# Patient Record
Sex: Female | Born: 1955
Health system: Southern US, Community
[De-identification: ages and names within clinical notes are randomized; demographics above are authoritative.]

## PROBLEM LIST (undated history)

## (undated) DIAGNOSIS — K219 Gastro-esophageal reflux disease without esophagitis: Secondary | ICD-10-CM

## (undated) DIAGNOSIS — Z955 Presence of coronary angioplasty implant and graft: Secondary | ICD-10-CM

## (undated) DIAGNOSIS — I209 Angina pectoris, unspecified: Secondary | ICD-10-CM

## (undated) DIAGNOSIS — E669 Obesity, unspecified: Secondary | ICD-10-CM

## (undated) DIAGNOSIS — I10 Essential (primary) hypertension: Secondary | ICD-10-CM

## (undated) DIAGNOSIS — E119 Type 2 diabetes mellitus without complications: Secondary | ICD-10-CM

## (undated) DIAGNOSIS — C801 Malignant (primary) neoplasm, unspecified: Secondary | ICD-10-CM

## (undated) DIAGNOSIS — E785 Hyperlipidemia, unspecified: Secondary | ICD-10-CM

## (undated) DIAGNOSIS — I2510 Atherosclerotic heart disease of native coronary artery without angina pectoris: Principal | ICD-10-CM

## (undated) DIAGNOSIS — I219 Acute myocardial infarction, unspecified: Secondary | ICD-10-CM

## (undated) HISTORY — DX: Obesity, unspecified: E66.9

---

## 1998-07-30 ENCOUNTER — Inpatient Hospital Stay (HOSPITAL_COMMUNITY): Admission: AD | Admit: 1998-07-30 | Discharge: 1998-07-30 | Payer: Self-pay | Admitting: Obstetrics & Gynecology

## 2001-04-10 ENCOUNTER — Emergency Department (HOSPITAL_COMMUNITY): Admission: EM | Admit: 2001-04-10 | Discharge: 2001-04-10 | Payer: Self-pay | Admitting: Emergency Medicine

## 2001-12-18 ENCOUNTER — Encounter: Payer: Self-pay | Admitting: Obstetrics

## 2001-12-18 ENCOUNTER — Inpatient Hospital Stay (HOSPITAL_COMMUNITY): Admission: AD | Admit: 2001-12-18 | Discharge: 2001-12-18 | Payer: Self-pay | Admitting: Obstetrics

## 2002-09-07 ENCOUNTER — Inpatient Hospital Stay (HOSPITAL_COMMUNITY): Admission: AD | Admit: 2002-09-07 | Discharge: 2002-09-07 | Payer: Self-pay | Admitting: *Deleted

## 2002-09-07 ENCOUNTER — Encounter: Payer: Self-pay | Admitting: *Deleted

## 2003-10-05 ENCOUNTER — Encounter: Payer: Self-pay | Admitting: *Deleted

## 2003-10-05 ENCOUNTER — Inpatient Hospital Stay (HOSPITAL_COMMUNITY): Admission: AD | Admit: 2003-10-05 | Discharge: 2003-10-05 | Payer: Self-pay | Admitting: *Deleted

## 2003-10-06 ENCOUNTER — Encounter: Payer: Self-pay | Admitting: *Deleted

## 2003-10-06 ENCOUNTER — Inpatient Hospital Stay (HOSPITAL_COMMUNITY): Admission: AD | Admit: 2003-10-06 | Discharge: 2003-10-06 | Payer: Self-pay | Admitting: Obstetrics & Gynecology

## 2003-10-10 ENCOUNTER — Inpatient Hospital Stay (HOSPITAL_COMMUNITY): Admission: AD | Admit: 2003-10-10 | Discharge: 2003-10-10 | Payer: Self-pay | Admitting: *Deleted

## 2004-12-16 DIAGNOSIS — I219 Acute myocardial infarction, unspecified: Secondary | ICD-10-CM

## 2004-12-16 HISTORY — PX: CORONARY STENT PLACEMENT: SHX1402

## 2004-12-16 HISTORY — DX: Acute myocardial infarction, unspecified: I21.9

## 2005-09-16 ENCOUNTER — Ambulatory Visit: Payer: Self-pay | Admitting: Family Medicine

## 2005-09-24 ENCOUNTER — Ambulatory Visit: Payer: Self-pay | Admitting: *Deleted

## 2005-10-08 ENCOUNTER — Encounter: Admission: RE | Admit: 2005-10-08 | Discharge: 2005-10-08 | Payer: Self-pay | Admitting: Family Medicine

## 2005-10-21 ENCOUNTER — Ambulatory Visit: Payer: Self-pay | Admitting: Family Medicine

## 2005-12-17 ENCOUNTER — Ambulatory Visit: Payer: Self-pay | Admitting: Family Medicine

## 2006-01-28 ENCOUNTER — Ambulatory Visit: Payer: Self-pay | Admitting: Family Medicine

## 2006-03-12 ENCOUNTER — Ambulatory Visit: Payer: Self-pay | Admitting: Family Medicine

## 2007-12-31 ENCOUNTER — Observation Stay (HOSPITAL_COMMUNITY): Admission: EM | Admit: 2007-12-31 | Discharge: 2008-01-03 | Payer: Self-pay | Admitting: Emergency Medicine

## 2008-02-01 ENCOUNTER — Encounter: Admission: RE | Admit: 2008-02-01 | Discharge: 2008-02-01 | Payer: Self-pay | Admitting: Family Medicine

## 2008-04-18 ENCOUNTER — Inpatient Hospital Stay (HOSPITAL_COMMUNITY): Admission: EM | Admit: 2008-04-18 | Discharge: 2008-04-20 | Payer: Self-pay | Admitting: Emergency Medicine

## 2008-05-02 ENCOUNTER — Emergency Department (HOSPITAL_COMMUNITY): Admission: EM | Admit: 2008-05-02 | Discharge: 2008-05-02 | Payer: Self-pay | Admitting: Emergency Medicine

## 2008-05-02 ENCOUNTER — Ambulatory Visit: Payer: Self-pay | Admitting: Surgery

## 2008-05-02 ENCOUNTER — Encounter (INDEPENDENT_AMBULATORY_CARE_PROVIDER_SITE_OTHER): Payer: Self-pay | Admitting: Emergency Medicine

## 2008-06-07 ENCOUNTER — Encounter: Admission: RE | Admit: 2008-06-07 | Discharge: 2008-06-07 | Payer: Self-pay | Admitting: Internal Medicine

## 2008-09-01 ENCOUNTER — Emergency Department (HOSPITAL_COMMUNITY): Admission: EM | Admit: 2008-09-01 | Discharge: 2008-09-01 | Payer: Self-pay | Admitting: Emergency Medicine

## 2008-09-04 ENCOUNTER — Emergency Department (HOSPITAL_COMMUNITY): Admission: EM | Admit: 2008-09-04 | Discharge: 2008-09-04 | Payer: Self-pay | Admitting: Emergency Medicine

## 2009-08-31 ENCOUNTER — Emergency Department (HOSPITAL_COMMUNITY): Admission: EM | Admit: 2009-08-31 | Discharge: 2009-08-31 | Payer: Self-pay | Admitting: Emergency Medicine

## 2010-01-27 ENCOUNTER — Inpatient Hospital Stay (HOSPITAL_COMMUNITY): Admission: EM | Admit: 2010-01-27 | Discharge: 2010-01-30 | Payer: Self-pay | Admitting: Emergency Medicine

## 2010-01-30 ENCOUNTER — Encounter: Payer: Self-pay | Admitting: Cardiovascular Disease

## 2010-02-15 ENCOUNTER — Ambulatory Visit: Payer: Self-pay | Admitting: Family Medicine

## 2010-02-27 ENCOUNTER — Ambulatory Visit: Payer: Self-pay | Admitting: Family Medicine

## 2010-07-26 ENCOUNTER — Ambulatory Visit: Payer: Self-pay | Admitting: Internal Medicine

## 2010-07-26 ENCOUNTER — Encounter (INDEPENDENT_AMBULATORY_CARE_PROVIDER_SITE_OTHER): Payer: Self-pay | Admitting: Family Medicine

## 2010-07-28 ENCOUNTER — Emergency Department (HOSPITAL_COMMUNITY): Admission: EM | Admit: 2010-07-28 | Discharge: 2010-07-28 | Payer: Self-pay | Admitting: Emergency Medicine

## 2011-01-06 ENCOUNTER — Encounter: Payer: Self-pay | Admitting: Internal Medicine

## 2011-03-06 LAB — D-DIMER, QUANTITATIVE: D-Dimer, Quant: 0.35 ug/mL-FEU (ref 0.00–0.48)

## 2011-03-06 LAB — COMPREHENSIVE METABOLIC PANEL
ALT: 32 U/L (ref 0–35)
AST: 27 U/L (ref 0–37)
AST: 30 U/L (ref 0–37)
Albumin: 2.9 g/dL — ABNORMAL LOW (ref 3.5–5.2)
BUN: 10 mg/dL (ref 6–23)
CO2: 23 mEq/L (ref 19–32)
Chloride: 105 mEq/L (ref 96–112)
Chloride: 108 mEq/L (ref 96–112)
Creatinine, Ser: 0.79 mg/dL (ref 0.4–1.2)
GFR calc Af Amer: >60 mL/min (ref >60)
GFR calc non Af Amer: >60 mL/min (ref >60)
GFR calc non Af Amer: >60 mL/min (ref >60)
Glucose, Bld: 237 mg/dL — ABNORMAL HIGH (ref 70–99)
Potassium: 3.7 mEq/L (ref 3.5–5.1)
Sodium: 137 mEq/L (ref 135–145)
Sodium: 137 mEq/L (ref 135–145)
Total Bilirubin: 0.3 mg/dL (ref 0.3–1.2)
Total Bilirubin: 0.4 mg/dL (ref 0.3–1.2)

## 2011-03-06 LAB — GLUCOSE, CAPILLARY
Glucose-Capillary: 176 mg/dL — ABNORMAL HIGH (ref 70–99)
Glucose-Capillary: 195 mg/dL — ABNORMAL HIGH (ref 70–99)
Glucose-Capillary: 215 mg/dL — ABNORMAL HIGH (ref 70–99)
Glucose-Capillary: 225 mg/dL — ABNORMAL HIGH (ref 70–99)
Glucose-Capillary: 239 mg/dL — ABNORMAL HIGH (ref 70–99)
Glucose-Capillary: 254 mg/dL — ABNORMAL HIGH (ref 70–99)
Glucose-Capillary: 261 mg/dL — ABNORMAL HIGH (ref 70–99)

## 2011-03-06 LAB — ABO/RH: ABO/RH(D): O POS

## 2011-03-06 LAB — DIFFERENTIAL
Lymphs Abs: 2.1 10*3/uL (ref 0.7–4.0)
Monocytes Absolute: 0.6 10*3/uL (ref 0.1–1.0)
Monocytes Relative: 7 % (ref 3–12)
Neutrophils Relative %: 62 % (ref 43–77)

## 2011-03-06 LAB — BASIC METABOLIC PANEL
CO2: 26 mEq/L (ref 19–32)
Calcium: 8.8 mg/dL (ref 8.4–10.5)
Chloride: 106 mEq/L (ref 96–112)
GFR calc Af Amer: >60 mL/min (ref >60)
Glucose, Bld: 120 mg/dL — ABNORMAL HIGH (ref 70–99)
Potassium: 3.5 mEq/L (ref 3.5–5.1)
Sodium: 138 mEq/L (ref 135–145)

## 2011-03-06 LAB — CARDIAC PANEL(CRET KIN+CKTOT+MB+TROPI)
Total CK: 53 U/L (ref 7–177)
Total CK: 62 U/L (ref 7–177)
Troponin I: 0.01 ng/mL (ref 0.00–0.06)
Troponin I: <0.01 ng/mL (ref 0.00–0.06)

## 2011-03-06 LAB — CROSSMATCH

## 2011-03-06 LAB — POCT CARDIAC MARKERS
CKMB, poc: <1 ng/mL — ABNORMAL LOW (ref 1.0–8.0)
CKMB, poc: <1 ng/mL — ABNORMAL LOW (ref 1.0–8.0)
Myoglobin, poc: 35.3 ng/mL (ref 12–200)
Myoglobin, poc: 36.8 ng/mL (ref 12–200)

## 2011-03-06 LAB — CBC
HCT: 33.5 % — ABNORMAL LOW (ref 36.0–46.0)
Hemoglobin: 11 g/dL — ABNORMAL LOW (ref 12.0–15.0)
Hemoglobin: 9.9 g/dL — ABNORMAL LOW (ref 12.0–15.0)
MCHC: 31.9 g/dL (ref 30.0–36.0)
MCHC: 32.8 g/dL (ref 30.0–36.0)
MCV: 81.8 fL (ref 78.0–100.0)
MCV: 82.1 fL (ref 78.0–100.0)
RBC: 4.07 MIL/uL (ref 3.87–5.11)
RBC: 4.22 MIL/uL (ref 3.87–5.11)
RDW: 16.3 % — ABNORMAL HIGH (ref 11.5–15.5)
RDW: 17 % — ABNORMAL HIGH (ref 11.5–15.5)
RDW: 17.3 % — ABNORMAL HIGH (ref 11.5–15.5)
WBC: 7.7 10*3/uL (ref 4.0–10.5)
WBC: 8.1 10*3/uL (ref 4.0–10.5)

## 2011-03-06 LAB — BRAIN NATRIURETIC PEPTIDE: Pro B Natriuretic peptide (BNP): <30 pg/mL (ref 0.0–100.0)

## 2011-03-18 ENCOUNTER — Emergency Department (HOSPITAL_COMMUNITY)
Admission: EM | Admit: 2011-03-18 | Discharge: 2011-03-18 | Disposition: A | Discharge status: Home / Self Care | Payer: Self-pay | Attending: Emergency Medicine | Admitting: Emergency Medicine

## 2011-03-18 DIAGNOSIS — E119 Type 2 diabetes mellitus without complications: Secondary | ICD-10-CM | POA: Insufficient documentation

## 2011-03-18 DIAGNOSIS — I2510 Atherosclerotic heart disease of native coronary artery without angina pectoris: Secondary | ICD-10-CM | POA: Insufficient documentation

## 2011-03-18 DIAGNOSIS — Z794 Long term (current) use of insulin: Secondary | ICD-10-CM | POA: Insufficient documentation

## 2011-03-18 DIAGNOSIS — H6690 Otitis media, unspecified, unspecified ear: Secondary | ICD-10-CM | POA: Insufficient documentation

## 2011-03-18 DIAGNOSIS — I252 Old myocardial infarction: Secondary | ICD-10-CM | POA: Insufficient documentation

## 2011-03-18 DIAGNOSIS — I10 Essential (primary) hypertension: Secondary | ICD-10-CM | POA: Insufficient documentation

## 2011-03-18 LAB — GLUCOSE, CAPILLARY: Glucose-Capillary: 397 mg/dL — ABNORMAL HIGH (ref 70–99)

## 2011-03-26 ENCOUNTER — Emergency Department (HOSPITAL_COMMUNITY)
Admission: EM | Admit: 2011-03-26 | Discharge: 2011-03-26 | Disposition: A | Discharge status: Home / Self Care | Payer: Self-pay | Attending: Emergency Medicine | Admitting: Emergency Medicine

## 2011-03-26 DIAGNOSIS — I10 Essential (primary) hypertension: Secondary | ICD-10-CM | POA: Insufficient documentation

## 2011-03-26 DIAGNOSIS — I252 Old myocardial infarction: Secondary | ICD-10-CM | POA: Insufficient documentation

## 2011-03-26 DIAGNOSIS — H6120 Impacted cerumen, unspecified ear: Secondary | ICD-10-CM | POA: Insufficient documentation

## 2011-03-26 DIAGNOSIS — I2510 Atherosclerotic heart disease of native coronary artery without angina pectoris: Secondary | ICD-10-CM | POA: Insufficient documentation

## 2011-03-26 DIAGNOSIS — E119 Type 2 diabetes mellitus without complications: Secondary | ICD-10-CM | POA: Insufficient documentation

## 2011-04-30 NOTE — Discharge Summary (Signed)
[name], [name]                 ACCOUNT NO.:  [id number]   MEDICAL RECORD NO.:  [id number]          PATIENT TYPE:  OBV   LOCATION:  2010                         FACILITY:  [facility]   PHYSICIAN:  Lezlie Octave, N.P.     DATE OF BIRTH:  1956/01/23   DATE OF ADMISSION:  12/31/2007  DATE OF DISCHARGE:  01/03/2008                               DISCHARGE SUMMARY   HISTORY:  [name] is a 54 year old African American female patient  with prior medical history of coronary artery disease.  She had an MI,  distal circumflex Taxus stenting in July of 2006 in [place].  We  apparently had seen her in the office March 04, 2007, and a 2-D and a  Myoview were done that was within normal limits.  She was seen in the  emergency room with complaints of substernal chest pain, aching across  the chest with nausea, shortness of breath for several days.  She states  it was similar to her pre MI symptoms.  Thus she was admitted for  further evaluation.  She was seen by Dr. Lynnea Ferrier.  Her labs came back.  PPI was ordered.  Her hemoglobin was 7.2 and she was given 2 units of  packed red blood cells.  A GI consult was called and she was seen by Dr.  Elnoria Howard.  An EGD was done and it showed esophagitis, gastritis and duodenal  ulcers.  An Helicobacter pylori was sent that will be followed by Dr.  Elnoria Howard.  She also mentioned that she had menorrhagia.  We tried to call a  GYN doctor, however, this was difficult and it was decided that she was  stable and that she could follow up as an outpatient.  She does not have  a GYN doctor here at this time, thus we gave her Dr. Sharol Roussel number of  [phone] to call as an outpatient.  It was also noted that her diabetes  was not well controlled and she was started on Lantus insulin. We did  check an A1c which was elevated at 9.2.   Her hemoglobin on the day of discharge January 03, 2008, was 9.3.  Her  hematocrit was 28.8, her WBCs were 9.9 and her glucose was 360.  Her  blood  pressure was 130/78, pulse was 93, respirations 18, temperature  was 97.6.  Her Diovan HCT had been held since she was admitted.  She was  told she could restart this at the time of discharge.  Chest x-ray  showed no active cardiopulmonary disease.  Her iron was low at 28.  TIBC  was 404.  Percent sat was 7%.  TSH was 0.520.   DISCHARGE MEDICATIONS:  1. Deplin 7.5 mg a day which is actually folate.  2. Plavix 75 mg a day.  3. Pravastatin 80 mg a day.  4. Janumet 04/999 one twice per day.  5. Metoprolol ER 50 mg a day.  6. Diovan/HCT 160/25 every day.  7. Protonix 40 mg twice a daily.  8. Lantus insulin 20 units at bedtime.  9. Nu-Iron 150 mg twice per  day.  10.Aspirin 81 mg a day.   DISCHARGE DIAGNOSES:  1. Anemia of blood loss and iron deficient.  2. Status post EGD by Dr. Elnoria Howard, revealing DUD (duodenal ulcer      disease), esophagitis and gastritis.  3. Chest pain thought secondary to her gastrointestinal problems with      negative CK/MBs and troponins.  4. Diabetes mellitus type 2 not controlled with a hemoglobin A1c of      9.2, started on Lantus insulin this admission.  5. Coronary artery disease, stable.  History of Taxus stenting to      circumflex July 2006 in [place].  6. Menometrorrhagia.  The patient states prolonged menstrual bleeding,      not seen by a gynecologist recently.  7. Hyperlipidemia.  8. Morbid obesity and obstructive sleep apnea.  9. Hypertension.      Lezlie Octave, N.P.     BB/MEDQ  D:  01/03/2008  T:  01/03/2008  Job:  [id number]   cc:   Genia Del, M.D.  Jordan Hawks Elnoria Howard, MD  Renaye Rakers, M.D.

## 2011-04-30 NOTE — Discharge Summary (Signed)
NAMEDEVYNN, HESSLER                 ACCOUNT NO.:  [id number]   MEDICAL RECORD NO.:  [id number]          PATIENT TYPE:  INP   LOCATION:  4740                         FACILITY:  [facility]   PHYSICIAN:  Nanetta Batty, M.D.   DATE OF BIRTH:  [birth year]   DATE OF ADMISSION:  04/18/2008  DATE OF DISCHARGE:  04/20/2008                               DISCHARGE SUMMARY   DISCHARGE DIAGNOSES:  Acute blood loss anemia with chest pain under:  1. Transfusion of 2 units of packed cells.  2. Coronary artery disease, history of MI and Taxus stent in the left      circumflex.  3. Hypertension not well controlled, currently increase the medicine.  4. Diabetes mellitus.  5. Dyslipidemia.  6. Iron deficiency anemia.   DISCHARGE CONDITION:  Improved.   PROCEDURES:  None.   DISCHARGE MEDICATIONS:  1. Plavix 75 mg daily.  2. Diovan HCT 320/25 daily new dose.  3. Janumet 50/1000 twice a day.  4. Pravastatin 80 mg daily.  5. Toprol XL 50 mg daily.  6. Aspirin 81 mg daily.  7. Folic daily.  8. Nexium 40 mg daily, prescription given by Dr. Elnoria Howard.  9. Lantus 30 units every evening.  10.Nu-Iron 150 mg daily  11.Nitroglycerin 150 is under tongue as needed for chest pain.   DISCHARGE CONDITION:  Improved.   HISTORY OF PRESENT ILLNESS:  A 55 year old female who presented to  emergency room with chest pain, shortness of breath for 24-36 hours.  Dyspnea on exertion with very minimal activity also dizziness lower  extremity weakness.  She had intermittent episodes of substernal chest  discomfort at rest at this point.  Originally thought reflux, and felt  like it may be similar to her previous duodenal ulcer.  She also was  diaphoretic and clammy.  She presented, had no acute EKG changes and was  admitted to rule out MI.  Hemoglobin was low on admission at 8.4.  She  was typed and crossed and eventually transfused 2 units.   HOSPITAL COURSE:  After 2 units of packed cells.  She was feeling much  better and  we asked Dr. Elnoria Howard to see her which he did.  She had already  had the 2 units of packed cells and felt better and again saw her on  04/20/2008, and felt she was stable for discharge, but that he will see  her in the office in 1 week, at that time plans for EGD if needed will  be done.   Cardiac enzymes were all negative was felt to be GI related.  She was  continued on her aspirin and Plavix, but her Protonix was changed to  Nexium per Dr. Elnoria Howard.  Additionally, we increased her Diovan HCT due to  blood pressure that was poorly controlled here.  At discharge, blood  pressure was 144/85, and actually that was on nitroglycerin at 3, pulse  88.  Heart regular rate and rhythm.  Lungs were clear and she was in  sinus rhythm.   LABORATORY DATA:  CBC originally 8.4, hematocrit 24.7 before the  transfusion a drop  to 7.5 with hematocrit 23.9, platelets 473, MCV 67.6  and prior to discharge hemoglobin 9.6, hematocrit 30.6, platelets 412.  Chemistry sodium 140, potassium 3.5 at discharge date up to 3.8,  chloride 102, CO2 27, BUN 12, creatinine 0.99, glucose 185-245.  Coags  protime 13.7, INR of 1.  She was placed on heparin for short period time  and was therapeutic at that point.  LFTs, AST 27, ALT 27, alkaline phos  82, total bili 0.4 and these remained stable.  Cardiac markers Troponin  I was 0.02 and less than  0.01, negative from MI.   Cholesterol 143, LDL 79, HDL 48, triglycerides 78, calcium 9, magnesium  was 1.5.  She had that replaced here in the hospital.  TSH was 1.342, D-  dimer was negative at 0.29 and BNP on admission was less than 30.  UA  was clear.   Chest x-ray on admission suboptimal inspiration.  No active disease.  An  EKG revealed sinus rhythm, normal EKG.   As stated Ms. Orlov was admitted with chest pain which she felt like her  duodenal ulcer pain.  She was admitted on heparin and nitro.  The  heparin was discontinued with a hemoglobin dropped to 7.5.  She was   transfused 2 units of packed cells.  GI consult was obtained.  We  discontinued her nitro, she ambulated without further chest pain.  She  was felt that this was GI in relation and not cardiac.  She will  continue her aspirin and Plavix.  We did increase her Diovan for  hypertension and she will follow up an outpatient with Dr. Allyson Sabal as well  as Dr. Elnoria Howard she will follow up with Dr. Parke Simmers as regularly scheduled to  see Dr. Allyson Sabal 04/2008, at 10:00 a.m.  She can return to work Apr 25, 2008, if she is stable.      Darcella Gasman. Annie Paras, N.P.      Nanetta Batty, M.D.  Electronically Signed    LRI/MEDQ  D:  04/20/2008  T:  04/21/2008  Job:  [id number]   cc:   Jordan Hawks. Elnoria Howard, MD  Renaye Rakers, M.D.

## 2011-04-30 NOTE — Consult Note (Signed)
[name], [name]                 ACCOUNT NO.:  [id number]   MEDICAL RECORD NO.:  [id number]          PATIENT TYPE:  OBV   LOCATION:  2010                         FACILITY:  [facility]   PHYSICIAN:  Jordan Hawks. Elnoria Howard, MD    DATE OF BIRTH:  06-25-1956   DATE OF CONSULTATION:  01/01/2008  DATE OF DISCHARGE:                                 CONSULTATION   REFERRING PHYSICIAN:  Nanetta Batty, M.D. who is their cardiologist.   PRIMARY CARE Tahjai Schetter:  Renaye Rakers, M.D.   HISTORY OF PRESENT ILLNESS:  This is a 55 year old female with a past  medical history of coronary artery disease status post MI with a Taxus  stent placed in July, 2006 who presents to the emergency room with  complaints of chest pain, epigastric pain, nausea and shortness of  breath.  The patient states that her symptoms started 3 to 4 days ago  and is progressively worsening.  These symptoms are similar to her prior  cardiac symptoms.  The most worrisome time for her was having the  shortness of breath, which prompted her to present to the emergency  room.  Subsequently, she was admitted by cardiology and underwent  further evaluation and she was found to be negative for an ischemic  cause.  She does state that she has been off of her Plavix and she does  not take aspirin either.  Approximately in May, 2008 she did present to  Dr. Tedra Senegal office with complaints of having a similar types of symptoms  and she was treated with antacid.  She is not able to recall the name  however, the medication was quite efficacious for her and she did not  have any recurrence of her symptoms.  She does not give a history of  chronic gastroesophageal reflux disease.  During her hospitalization the  patient is noted to have a drop in her hemoglobin in the 9 range down to  7.2.  Subsequently, a GI consultation was requested.   PAST MEDICAL AND SURGICAL HISTORY:  As stated above with the addition  for:  1. Diabetes.  2. Sleep apnea.  3.  Hypertension.  4. Hyperlipidemia.   MEDICATION/:  1. Aspirin 81 mg p.o. daily.  2. Plavix 75 mg p.o. daily  3. Sliding scale insulin.  4. Metoprolol 50 mg p.o. daily.  5. Protonix 40 mg p.o. b.i.d.  6. Simvastatin 40 mg p.o. q.h.s.  7. Januvia 50 mg one p.o. daily.  8. Loperamide 2 to 4 mg p.o. q.4-6h. p.r.n. diarrhea.   ALLERGIES:  NO KNOWN DRUG ALLERGIES.   SOCIAL HISTORY:  Prior history of tobacco use, but no alcohol or illicit  drug use.   FAMILY HISTORY:  Significant for myocardial infarction and stroke.   REVIEW OF SYSTEMS:  As stated above in history present illness,  otherwise negative.   PHYSICAL EXAMINATION:  VITAL SIGNS:  Blood pressure is 122/77, heart  rate is 84, temperature is 97, respirations 18, temperature is a 97.  GENERAL:  The patient is in no acute distress, alert and oriented.  HEENT:  Normocephalic, atraumatic.  Extraocular muscles intact.  NECK:  Supple.  No lymphadenopathy.  LUNGS:  Clear to auscultation bilaterally.  CARDIOVASCULAR:  Regular rate and rhythm.  ABDOMEN:  Obese, soft, some epigastric tenderness.  No rebound or  rigidity.  Positive bowel sounds.  EXTREMITIES:  No clubbing, cyanosis or edema.   LABORATORY VALUES:  White blood cell count is 9.3, hemoglobin is 7.2,  MCV is 80, platelets at 365.  PT is 13.9, INR is 1.1.  Sodium was 133,  potassium 4, chloride 100, CO2 of 26, glucose 430, BUN 9, creatinine 1.  Total bilirubin of 0.8, alk-phos 87, AST 47, ALT 32, albumin is 2.1.   IMPRESSION:  1. Epigastric pain and nausea.  2. History of coronary artery disease.  3. History of gastroesophageal reflux disease.  4. His anemia.   In light of her negative cardiac workup in the findings of a anemia as  well as a prior history of esophageal reflux disease and the patient may  be having an esophagitis.  Further evaluation with  esophagogastroduodenoscopy is warranted at this time.   PLAN:  EGD.  Further recommendations pending the  findings.      Jordan Hawks Elnoria Howard, MD  Electronically Signed     PDH/MEDQ  D:  01/01/2008  T:  01/01/2008  Job:  [id number]   cc:   Nanetta Batty, M.D.  Renaye Rakers, M.D.

## 2011-04-30 NOTE — Consult Note (Signed)
[name], [name]                 ACCOUNT NO.:  [id number]   MEDICAL RECORD NO.:  [id number]          PATIENT TYPE:  INP   LOCATION:  4740                         FACILITY:  [facility]   PHYSICIAN:  Jordan Hawks. Elnoria Howard, MD    DATE OF BIRTH:  12/04/1956   DATE OF CONSULTATION:  04/19/2008  DATE OF DISCHARGE:  04/20/2008                                 CONSULTATION   REASON FOR CONSULTATION:  Anemia and chest pain.   CARDIOLOGIST:  Nanetta Batty, M.D.   PRIMARY CARE Cyla Haluska:  Renaye Rakers, M.D.   HISTORY OF PRESENT ILLNESS:  This is a 55 year old female with a past  medical history of coronary artery disease status post MI with a Taxus  stent placed in July 2006, diabetes, sleep apnea, hypertension,  hyperlipidemia, and morbid obesity who presents to the emergency room  with complaints of dyspnea on exertion and fatigue.  The patient states  that her symptoms started rather rapidly over the past couple of days,  and with the onset of her chest pain, she was concerned that it could be  secondary to heart attack and subsequently she presented to the  emergency room for further evaluation and treatment.  During the first  portion of her hospitalization, the patient was ruled out for myocardial  infarction, and during the workup she was noted to have a hemoglobin of  7.5.  It was felt that this anemia was the precipitating cause for her  complaints of chest pain.  Subsequently, GI was requested for further  evaluation and treatment.  In January 2009, she was evaluated by Dr.  Elnoria Howard for similar types of complaints and she underwent an EGD at that  time with findings of a mild esophagitis, gastritis, and a clean-based  duodenal ulcer.  The patient was supposed to follow up with Dr. Elnoria Howard in  2 weeks, and she was also to remain on Protonix b.i.d.;however, she did  not follow up in the office as requested for reasons that are not clear,  and per her report, she states that she was not discharged with  any  types of proton pump inhibitors.  She is chronically on iron therapy and  a rectal examination was performed and was heme-negative.  She denies  any other abdominal complaints at this time.   PAST MEDICAL HISTORY AND PAST SURGICAL HISTORY:  As stated above.   MEDICATIONS:  1. Aspirin.  2. Plavix.  3. Colace.  4. Lasix.  5. Sliding scale insulin.  6. Iron.  7. Magnesium.  8. Metoprolol.  9. Protonix.  10.Simvastatin.  11.Valsartan  12.Loperamide.  13.Zofran.  14.Ambien.   ALLERGIES:  NO KNOWN DRUG ALLERGIES.   REVIEW OF SYSTEMS:  As stated in the above in history of present  illness, otherwise, negative per the 10-point review of systems.   FAMILY HISTORY:  Noncontributory.   SOCIAL HISTORY:  Negative for alcohol and illicit drug use, but she does  have a tobacco history.   PHYSICAL EXAMINATION:  VITAL SIGNS:  Blood pressure is 138/89, heart  rate is 88, temperature is 97.1, and respirations 20.  GENERAL:  The patient is in no acute distress, alert and oriented.  HEENT:  Normocephalic, atraumatic.  Extraocular muscles intact.  NECK:  Supple.  No lymphadenopathy.  LUNGS:  Clear to auscultation bilaterally.  CARDIOVASCULAR:  Regular rate and rhythm.  ABDOMEN:  Morbidly obese, soft, nontender, and nondistended.  EXTREMITIES:  No clubbing, cyanosis, or edema.   LABORATORY VALUES:  White blood cell count is 8.8, hemoglobin 10.4  status post 2 units of blood, MCV 72.5, and platelets at 455.  PT is  15.5, INR 1.0, PTT 62.  Sodium 140, potassium 3.5, chloride 102, CO2 27,  glucose 185, BUN 12 and creatinine 0.9.   IMPRESSION:  1. Anemia.  2. History of duodenal ulcers/gastritis/esophagitis.  3. Coronary artery disease, requiring chronic Plavix administration.      The patient is noted to be heme-negative; however, her current      symptoms are very similar to her prior presentation in this past      January where she was found to have a small clean-based duodenal       ulcer as well as gastritis and esophagitis.  It is likely that she      is bleeding from these upper gastrointestinal sources.  At this      time, I do not feel that she requires a repeat endoscopy, as she      reported that she did not receive any prescriptions to maintain her      proton pump inhibitor at this time since I believe that the patient      can be safely discharged from a gastrointestinal standpoint. At      this time, the patient has resumed Protonix.  I believe the best      course of action will be for the patient to follow up with Dr. Elnoria Howard      in the office in approximately 2 weeks with a recheck of her blood      count while on protracted venous infusion.  If there is any      trending and any decrease in her hemoglobin, then she will require      a repeat endoscopy.      Jordan Hawks Elnoria Howard, MD  Electronically Signed     PDH/MEDQ  D:  04/19/2008  T:  04/20/2008  Job:  [id number]   cc:   Nanetta Batty, M.D.  Renaye Rakers, M.D.

## 2011-04-30 NOTE — H&P (Signed)
[name], [name]                 ACCOUNT NO.:  [id number]   MEDICAL RECORD NO.:  [id number]          PATIENT TYPE:  OBV   LOCATION:  2010                         FACILITY:  [name]   PHYSICIAN:  Elizabeth Slot, MD     DATE OF BIRTH:  28-Jul-1956   DATE OF ADMISSION:  12/31/2007  DATE OF DISCHARGE:                              HISTORY & PHYSICAL   CHIEF COMPLAINT:  Nausea and chest pain.   HISTORY OF PRESENT ILLNESS:  The patient is a 55 year old female with  history of coronary disease.  She had an MI involving the distal  circumflex which was treated with a [name] stent while in [name] in  July 2006.  We saw her in the office initially in March 2008.  She had  been off her Plavix and beta blocker and we resumed those.  We did do an  echocardiogram and a Myoview both of which were unremarkable.  She is  now seen in the emergency room with complaints of chest pain and nausea.  Her symptoms were somewhat vague but she says they are similar to what  she had in the past with her MI.  She has not taken any nitroglycerin.  She is admitted now through the emergency room for further evaluation.   PAST MEDICAL HISTORY:  Remarkable for non-insulin-dependent diabetes.  She has sleep apnea but declined to use CPAP.  She has treated  hypertension and dyslipidemia.   CURRENT MEDICATIONS:  1. Plavix 75 mg a day.  2. Pravachol 80 mg a day.  3. Diovan/HCTZ 160/25 daily.  4. Toprol-XL 50 mg a day.  5. Janumet 50/1 gram b.i.d.   ALLERGIES:  She has no known drug allergies.   SOCIAL HISTORY:  She quit smoking after an MI in 2006.  She is single.  She has two children.  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   FAMILY HISTORY:  Remarkable for coronary disease, her mother died 49  with an MI.  Her sister died in her 66s of a stroke.   REVIEW OF SYSTEMS:  Essentially unremarkable except for above, she does  have gastroesophageal reflux symptoms.  She denies any fever or chills.  She has  had some vomiting.   PHYSICAL EXAMINATION:  VITAL SIGNS:  Initial blood pressure was 150/60  and subsequent pressure is 98/65, pulse 76, O2 sat is 97.  GENERAL:  In general, she is an obese African-American female in no  acute distress.  HEENT:  Normocephalic, atraumatic Extraocular movements are intact.  Sclerae is nonicteric.  Lids and conjunctivae within normal limits.  NECK:  Without JVD or bruit.  CHEST:  Clear to auscultation and percussion.  CARDIAC EXAM:  Reveals regular rate and rhythm without obvious murmur or  gallop.  Normal S1, S2.  ABDOMEN:  Morbidly obese and nontender.  Bowel  sounds are present.  EXTREMITIES:  Without edema.  Distal pulses are 3+/4.  NEUROLOGIC:  Exam is grossly intact.   DIAGNOSTIC STUDIES:  EKG shows sinus rhythm without acute changes.   LABORATORY DATA:  Initial troponin is negative.  Creatinine is within  normal limits.  D-dimer is 0.44.   IMPRESSION:  1. Chest pain, question cardiac, rule out myocardial infarction.  2. Coronary disease, status post [name] stenting to the distal      circumflex July 2006 in [name].  3. Noninsulin-dependent diabetes mellitus.  4. Morbid obesity.  5. Sleep apnea, noncompliant with CPAP.  6. Treated dyslipidemia.  7. Treated hypertension.   PLAN:  She will be admitted to telemetry.  We will add a PPI.  We will  go ahead and cycle enzymes, will out MI.  She was seen by Dr. Lynnea [name]  and myself in the emergency room.  At this time will not had a heparin  or Lovenox unless her troponins come back positive.  If her enzymes were  negative, we may be able to discharge her tomorrow and send her out for  an outpatient nuclear study.      [name], P.A.      Elizabeth Slot, MD  Electronically Signed    LKK/MEDQ  D:  12/31/2007  T:  01/01/2008  Job:  [id number]

## 2011-09-06 LAB — TSH: TSH: 0.52

## 2011-09-06 LAB — CBC
HCT: 23.8 — ABNORMAL LOW
HCT: 27.2 — ABNORMAL LOW
HCT: 28.8 — ABNORMAL LOW
Hemoglobin: 7.8 — CL
Hemoglobin: 9 — ABNORMAL LOW
Hemoglobin: 9.3 — ABNORMAL LOW
MCHC: 32.7
MCHC: 33.2
MCV: 80.8
MCV: 82.5
Platelets: 365
Platelets: 415 — ABNORMAL HIGH
RBC: 2.95 — ABNORMAL LOW
RBC: 3.29 — ABNORMAL LOW
RBC: 3.64 — ABNORMAL LOW
RDW: 16.9 — ABNORMAL HIGH
RDW: 17.1 — ABNORMAL HIGH
WBC: 9.3
WBC: 9.8

## 2011-09-06 LAB — DIFFERENTIAL
Basophils Absolute: 0
Basophils Relative: 0
Eosinophils Absolute: 0
Eosinophils Relative: 0
Lymphocytes Relative: 13
Lymphocytes Relative: 30
Lymphs Abs: 1.2
Lymphs Abs: 2.9
Monocytes Absolute: 0.6
Monocytes Relative: 6
Monocytes Relative: 7
Neutro Abs: 6.1
Neutro Abs: 7.5
Neutrophils Relative %: 62
Neutrophils Relative %: 81 — ABNORMAL HIGH

## 2011-09-06 LAB — COMPREHENSIVE METABOLIC PANEL WITH GFR
ALT: 32
Alkaline Phosphatase: 87
CO2: 26
Calcium: 8.5
Chloride: 100
GFR calc non Af Amer: 58 — ABNORMAL LOW
Glucose, Bld: 430 — ABNORMAL HIGH
Sodium: 133 — ABNORMAL LOW
Total Bilirubin: 0.8

## 2011-09-06 LAB — I-STAT 8, (EC8 V) (CONVERTED LAB)
Acid-base deficit: 4 — ABNORMAL HIGH
BUN: 7
Bicarbonate: 23.1
Chloride: 104
Glucose, Bld: 262 — ABNORMAL HIGH
HCT: 28 — ABNORMAL LOW
Hemoglobin: 9.5 — ABNORMAL LOW
Operator id: 198171
Potassium: 3.8
Sodium: 136
TCO2: 25
pCO2, Ven: 47.9
pH, Ven: 7.291

## 2011-09-06 LAB — CK TOTAL AND CKMB (NOT AT ARMC)
CK, MB: 0.6
CK, MB: 0.7
Relative Index: INVALID
Relative Index: INVALID
Total CK: 65
Total CK: 75

## 2011-09-06 LAB — POCT I-STAT CREATININE
Creatinine, Ser: 0.7
Operator id: 198171

## 2011-09-06 LAB — URINALYSIS, MICROSCOPIC ONLY
Bilirubin Urine: NEGATIVE
Glucose, UA: >1000 — AB
Ketones, ur: NEGATIVE
Leukocytes, UA: NEGATIVE
Nitrite: NEGATIVE
Protein, ur: NEGATIVE
Specific Gravity, Urine: 1.025
Urobilinogen, UA: 0.2
pH: 5.5

## 2011-09-06 LAB — CROSSMATCH
ABO/RH(D): O POS
Antibody Screen: NEGATIVE

## 2011-09-06 LAB — HEMOGLOBIN AND HEMATOCRIT, BLOOD: Hemoglobin: 7.2 — CL

## 2011-09-06 LAB — BASIC METABOLIC PANEL
CO2: 25
Calcium: 8.6
Chloride: 107
GFR calc Af Amer: >60
Glucose, Bld: 278 — ABNORMAL HIGH
Potassium: 4.1
Sodium: 139

## 2011-09-06 LAB — PROTIME-INR
INR: 1.1
Prothrombin Time: 13.9

## 2011-09-06 LAB — POCT CARDIAC MARKERS
CKMB, poc: <1 — ABNORMAL LOW
Myoglobin, poc: 61.5
Operator id: 198171
Troponin i, poc: <0.05

## 2011-09-06 LAB — COMPREHENSIVE METABOLIC PANEL
AST: 47 — ABNORMAL HIGH
Albumin: 3.1 — ABNORMAL LOW
BUN: 9
Creatinine, Ser: 1.01
GFR calc Af Amer: >60
Potassium: 4
Total Protein: 6.7

## 2011-09-06 LAB — D-DIMER, QUANTITATIVE: D-Dimer, Quant: 0.41

## 2011-09-06 LAB — TROPONIN I
Troponin I: 0.02
Troponin I: <0.01

## 2011-09-06 LAB — HEMOGLOBIN A1C: Mean Plasma Glucose: 250

## 2011-09-06 LAB — IRON AND TIBC
Iron: 28 — ABNORMAL LOW
TIBC: 404
UIBC: 376

## 2011-09-06 LAB — ABO/RH: ABO/RH(D): O POS

## 2011-09-16 LAB — CBC
HCT: 25.3 — ABNORMAL LOW
HCT: 25.8 — ABNORMAL LOW
Hemoglobin: 7.9 — CL
Hemoglobin: 8 — ABNORMAL LOW
MCHC: 31.1
MCHC: 31.2
MCV: 67.8 — ABNORMAL LOW
Platelets: 413 — ABNORMAL HIGH
Platelets: 416 — ABNORMAL HIGH
RBC: 3.81 — ABNORMAL LOW
RDW: 20.7 — ABNORMAL HIGH
RDW: 21.1 — ABNORMAL HIGH
WBC: 13.6 — ABNORMAL HIGH

## 2011-09-16 LAB — HEPATIC FUNCTION PANEL
ALT: 17
AST: 16
Albumin: 2.7 — ABNORMAL LOW
Total Protein: 7.5

## 2011-09-16 LAB — DIFFERENTIAL
Basophils Absolute: 0
Basophils Relative: 0
Basophils Relative: 1
Eosinophils Absolute: 0.1
Eosinophils Relative: 0
Eosinophils Relative: 1
Lymphocytes Relative: 13
Lymphocytes Relative: 16
Lymphs Abs: 1.8
Monocytes Absolute: 1
Monocytes Relative: 7
Neutro Abs: 10.7 — ABNORMAL HIGH
Neutrophils Relative %: 77
Neutrophils Relative %: 79 — ABNORMAL HIGH

## 2011-09-16 LAB — URINALYSIS, ROUTINE W REFLEX MICROSCOPIC
Glucose, UA: >1000 — AB
Hgb urine dipstick: NEGATIVE
Ketones, ur: 15 — AB
Leukocytes, UA: NEGATIVE
Nitrite: NEGATIVE
Protein, ur: NEGATIVE
Specific Gravity, Urine: >1.046 — ABNORMAL HIGH
Urobilinogen, UA: 1
pH: 5.5

## 2011-09-16 LAB — POCT I-STAT, CHEM 8
BUN: 8
Calcium, Ion: 1.15
Chloride: 102

## 2011-09-16 LAB — URINE MICROSCOPIC-ADD ON

## 2011-09-16 LAB — POCT CARDIAC MARKERS
CKMB, poc: <1 — ABNORMAL LOW
Myoglobin, poc: 72.4
Troponin i, poc: <0.05

## 2011-09-16 LAB — COMPREHENSIVE METABOLIC PANEL
BUN: 7
Calcium: 8.6
Glucose, Bld: 391 — ABNORMAL HIGH
Sodium: 130 — ABNORMAL LOW
Total Protein: 7

## 2011-09-16 LAB — PROTIME-INR: INR: 1.2

## 2011-09-16 LAB — GLUCOSE, CAPILLARY: Glucose-Capillary: 304 — ABNORMAL HIGH

## 2011-09-16 LAB — APTT: aPTT: 34

## 2011-09-16 LAB — LIPASE, BLOOD: Lipase: 23

## 2013-04-20 ENCOUNTER — Emergency Department (HOSPITAL_COMMUNITY)
Admission: EM | Admit: 2013-04-20 | Discharge: 2013-04-20 | Disposition: A | Discharge status: Home / Self Care | Payer: Self-pay | Attending: Emergency Medicine | Admitting: Emergency Medicine

## 2013-04-20 ENCOUNTER — Encounter (HOSPITAL_COMMUNITY): Payer: Self-pay | Admitting: Emergency Medicine

## 2013-04-20 DIAGNOSIS — Z87891 Personal history of nicotine dependence: Secondary | ICD-10-CM | POA: Insufficient documentation

## 2013-04-20 DIAGNOSIS — N764 Abscess of vulva: Secondary | ICD-10-CM | POA: Insufficient documentation

## 2013-04-20 DIAGNOSIS — L0291 Cutaneous abscess, unspecified: Secondary | ICD-10-CM

## 2013-04-20 DIAGNOSIS — I252 Old myocardial infarction: Secondary | ICD-10-CM | POA: Insufficient documentation

## 2013-04-20 DIAGNOSIS — IMO0001 Reserved for inherently not codable concepts without codable children: Secondary | ICD-10-CM | POA: Insufficient documentation

## 2013-04-20 HISTORY — DX: Type 2 diabetes mellitus without complications: E11.9

## 2013-04-20 HISTORY — DX: Acute myocardial infarction, unspecified: I21.9

## 2013-04-20 LAB — GLUCOSE, CAPILLARY: Glucose-Capillary: 339 mg/dL — ABNORMAL HIGH (ref 70–99)

## 2013-04-20 MED ORDER — SULFAMETHOXAZOLE-TRIMETHOPRIM 800-160 MG PO TABS: 1.0000 | ORAL_TABLET | Freq: Two times a day (BID) | ORAL | Status: DC

## 2013-04-20 MED ORDER — ONDANSETRON 8 MG PO TBDP
8.0000 mg | ORAL_TABLET | Freq: Once | ORAL | Status: AC
  Administered 2013-04-20: 8 mg via ORAL
  Filled 2013-04-20: qty 1

## 2013-04-20 MED ORDER — SULFAMETHOXAZOLE-TMP DS 800-160 MG PO TABS
1.0000 | ORAL_TABLET | Freq: Once | ORAL | Status: AC
  Administered 2013-04-20: 1 via ORAL
  Filled 2013-04-20: qty 1

## 2013-04-20 MED ORDER — HYDROCODONE-ACETAMINOPHEN 5-325 MG PO TABS: ORAL_TABLET | ORAL | Status: DC

## 2013-04-20 MED ORDER — HYDROMORPHONE HCL PF 2 MG/ML IJ SOLN: 2.0000 mg | Freq: Once | INTRAMUSCULAR | Status: DC

## 2013-04-20 MED ORDER — HYDROMORPHONE HCL PF 2 MG/ML IJ SOLN
2.0000 mg | Freq: Once | INTRAMUSCULAR | Status: DC
  Administered 2013-04-20: 2 mg via INTRAVENOUS
  Filled 2013-04-20: qty 1

## 2013-04-20 NOTE — ED Notes (Signed)
Pt has boil that has been there in vaginal area on left side for about 2 weeks, just recently burst today and is now bleeding.

## 2013-04-20 NOTE — ED Provider Notes (Signed)
History     CSN: [id number]  Arrival date & time 04/20/13  1509   First MD Initiated Contact with Patient 04/20/13 1514      Chief Complaint  Patient presents with  . Recurrent Skin Infections    (Consider location/radiation/quality/duration/timing/severity/associated sxs/prior treatment) HPI Comments: Patient with history of diabetes presents with complaint of an enlarging abscess in her groin for the past 2 weeks. Patient has been treating at home with warm compresses, salted pork, egg whites without relief. No fever. Pt does not check her blood sugars. Area has been spontaneously draining after arrival to ED. The onset of this condition was acute. The course is worsening. Aggravating factors: none. Alleviating factors: none.    The history is provided by the patient.    Past Medical History  Diagnosis Date  . Diabetes mellitus without complication   . Heart attack     History reviewed. No pertinent past surgical history.  No family history on file.  History  Substance Use Topics  . Smoking status: Former Games developer  . Smokeless tobacco: Not on file  . Alcohol Use: Yes     Comment: ocassionally     OB History   Grav Para Term Preterm Abortions TAB SAB Ect Mult Living                  Review of Systems  Constitutional: Negative for fever.  Gastrointestinal: Negative for nausea and vomiting.  Genitourinary: Negative for dysuria.  Skin: Negative for color change.       Positive for abscess.  Hematological: Negative for adenopathy.    Allergies  Review of patient's allergies indicates no known allergies.  Home Medications   Current Outpatient Rx  Name  Route  Sig  Dispense  Refill  . acetaminophen (TYLENOL) 500 MG tablet   Oral   Take 2,000 mg by mouth every 6 (six) hours as needed for pain.           BP 150/86  Pulse 93  Temp(Src) 98 F (36.7 C) (Oral)  Resp 20  SpO2 98%  Physical Exam  Nursing note and vitals reviewed. Constitutional: She  appears well-developed and well-nourished.  HENT:  Head: Normocephalic and atraumatic.  Eyes: Conjunctivae are normal. Right eye exhibits no discharge. Left eye exhibits no discharge.  Neck: Normal range of motion. Neck supple.  Cardiovascular: Normal rate, regular rhythm and normal heart sounds.   Pulmonary/Chest: Effort normal and breath sounds normal.  Abdominal: Soft. There is no tenderness.  Neurological: She is alert.  Skin: Skin is warm and dry.  Patient with complicated, indurated area lateral and posterior to left labia. Area is very tender to palpation. It is spontaneously draining. It does not involve the rectum or anus. There does not appear to be Fournier's gangrene.  Psychiatric: She has a normal mood and affect.    ED Course  Procedures (including critical care time)  Labs Reviewed  GLUCOSE, CAPILLARY - Abnormal; Notable for the following:    Glucose-Capillary 339 (*)    All other components within normal limits  WOUND CULTURE   No results found.   1. Abscess     4:19 PM Patient seen and examined. Work-up initiated. Medications ordered.   Vital signs reviewed and are as follows: Filed Vitals:   04/20/13 1531  BP: 150/86  Pulse: 93  Temp: 98 F (36.7 C)  Resp: 20   Pain medicine given and I&D attempted with very little additional drainage.   Bedside ultrasound was then  used and no drainable pockets identified.   Patient was d/w and seen by Dr. Juleen China who agrees.   Patient counseled on sitz baths and abx use, follow-up in 48 hrs if not improved.   The patient was urged to return to the Emergency Department urgently with worsening pain, swelling, expanding erythema especially if it streaks away from the affected area, fever, or if they have any other concerns.   Patient counseled on use of narcotic pain medications. Counseled not to combine these medications with others containing tylenol. Urged not to drink alcohol, drive, or perform any other activities  that requires focus while taking these medications. The patient verbalizes understanding and agrees with the plan.  The patient verbalized understanding and stated agreement with this plan.   INCISION AND DRAINAGE Performed by: Carolee Rota Consent: Verbal consent obtained. Risks and benefits: risks, benefits and alternatives were discussed Type: abscess  Body area: L groin  Anesthesia: local infiltration  Incision was made with a scalpel.  Local anesthetic: lidocaine 2% with epinephrine  Anesthetic total: 5 ml  Complexity: complex Blunt dissection to break up loculations  Drainage: purulent  Drainage amount: minimal  Packing material: none  Patient tolerance: Patient tolerated the procedure well with no immediate complications.      MDM  Patient with complicated abscess. I&D attempted but very little pus expressed. No systemic sx of illness. No Fournier's gangrene. Conservative management indicated. Return instructions given.  Hyperglycemia -- do not suspect DKA. Pt unable to afford insulin at current time. She has been given resources here. She states that she will be able to afford Bactrim if on $4 list. She has been given first dose here.        Renne Crigler, PA-C 04/20/13 1920

## 2013-04-21 NOTE — ED Provider Notes (Signed)
Medical screening examination/treatment/procedure(s) were conducted as a shared visit with non-physician practitioner(s) and myself.  I personally evaluated the patient during the encounter.  Pt with cellulitis just lateral to L labia majora. No bartholin's cyst. Areas of induration and questionable fluctuance. Attempted I&D by Ge[facility] w/o significant purulence. My bedside US with cobblestoning consistent with cellulitis w/o localized collection. Diabetic. Non toxic. I feel she is appropriate for outpt tx at this time. Course of abx. Continued sitz baths. Return precautions discussed.   Raeford Razor, MD 04/21/13 [id number]

## 2013-04-22 LAB — WOUND CULTURE: Special Requests: NORMAL

## 2013-04-23 ENCOUNTER — Telehealth (HOSPITAL_COMMUNITY): Payer: Self-pay | Admitting: Emergency Medicine

## 2013-04-23 NOTE — Progress Notes (Signed)
ED Antimicrobial Stewardship Positive Culture Follow Up   [name] is an 57 y.o. female who presented to [facility] on 04/20/2013 with a chief complaint of  Chief Complaint  Patient presents with  . Recurrent Skin Infections    Recent Results (from the past 720 hour(s))  WOUND CULTURE     Status: None   Collection Time    04/20/13  4:26 PM      Result Value Range Status   Specimen Description PERITONEAL ABSCESS   Final   Special Requests Normal   Final   Gram Stain     Final   Value: MODERATE WBC PRESENT, PREDOMINANTLY PMN     NO SQUAMOUS EPITHELIAL CELLS SEEN     NO ORGANISMS SEEN   Culture     Final   Value: MODERATE GROUP B STREP(S.AGALACTIAE)ISOLATED     Note: TESTING AGAINST S. AGALACTIAE NOT ROUTINELY PERFORMED DUE TO PREDICTABILITY OF AMP/PEN/VAN SUSCEPTIBILITY.   Report Status 04/22/2013 FINAL   Final    [x]  Treated with Bactrim, organism resistant to prescribed antimicrobial []  Patient discharged originally without antimicrobial agent and treatment is now indicated  New antibiotic prescription: amoxicillin 500mg  TID x 7 days  ED Provider: Junious Silk, PA-C   [name] 04/23/2013, 3:16 PM Infectious Diseases Pharmacist Phone# [phone]

## 2013-04-23 NOTE — ED Notes (Signed)
Post ED Visit - Positive Culture Follow-up: Successful Patient Follow-Up  Culture assessed and recommendations reviewed by: []  Wes Dulaney, Pharm.D., BCPS [x]  Celedonio Miyamoto, Pharm.D., BCPS []  Georgina Pillion, Pharm.D., BCPS []  [place], [address].D., BCPS, AAHIVP []  Estella Husk, Pharm.D., BCPS, AAHIVP  Positive wound culture  []  Patient discharged without antimicrobial prescription and treatment is now indicated [x]  Organism is resistant to prescribed ED discharge antimicrobial []  Patient with positive blood cultures  Changes discussed with ED provider: Junious Silk PA-C New antibiotic prescription Amoxil 500 mg TID x 7 days    Kylie A Holland 04/23/2013, 2:25 PM

## 2013-06-03 ENCOUNTER — Emergency Department (HOSPITAL_COMMUNITY): Payer: Self-pay

## 2013-06-03 ENCOUNTER — Encounter (HOSPITAL_COMMUNITY): Payer: Self-pay | Admitting: Emergency Medicine

## 2013-06-03 ENCOUNTER — Inpatient Hospital Stay (HOSPITAL_COMMUNITY)
Admission: EM | Admit: 2013-06-03 | Discharge: 2013-06-09 | DRG: 247 | Disposition: A | Discharge status: Home / Self Care | Payer: MEDICAID | Attending: Cardiovascular Disease | Admitting: Cardiovascular Disease

## 2013-06-03 DIAGNOSIS — I200 Unstable angina: Secondary | ICD-10-CM

## 2013-06-03 DIAGNOSIS — I10 Essential (primary) hypertension: Secondary | ICD-10-CM | POA: Diagnosis present

## 2013-06-03 DIAGNOSIS — Z794 Long term (current) use of insulin: Secondary | ICD-10-CM

## 2013-06-03 DIAGNOSIS — Z6841 Body Mass Index (BMI) 40.0 and over, adult: Secondary | ICD-10-CM

## 2013-06-03 DIAGNOSIS — R0602 Shortness of breath: Secondary | ICD-10-CM | POA: Diagnosis present

## 2013-06-03 DIAGNOSIS — Z91199 Patient's noncompliance with other medical treatment and regimen due to unspecified reason: Secondary | ICD-10-CM

## 2013-06-03 DIAGNOSIS — IMO0001 Reserved for inherently not codable concepts without codable children: Secondary | ICD-10-CM | POA: Diagnosis present

## 2013-06-03 DIAGNOSIS — K219 Gastro-esophageal reflux disease without esophagitis: Secondary | ICD-10-CM | POA: Diagnosis present

## 2013-06-03 DIAGNOSIS — E785 Hyperlipidemia, unspecified: Secondary | ICD-10-CM

## 2013-06-03 DIAGNOSIS — I2510 Atherosclerotic heart disease of native coronary artery without angina pectoris: Principal | ICD-10-CM | POA: Diagnosis present

## 2013-06-03 DIAGNOSIS — E669 Obesity, unspecified: Secondary | ICD-10-CM | POA: Diagnosis present

## 2013-06-03 DIAGNOSIS — R079 Chest pain, unspecified: Secondary | ICD-10-CM

## 2013-06-03 DIAGNOSIS — R262 Difficulty in walking, not elsewhere classified: Secondary | ICD-10-CM | POA: Diagnosis present

## 2013-06-03 DIAGNOSIS — Z87891 Personal history of nicotine dependence: Secondary | ICD-10-CM

## 2013-06-03 DIAGNOSIS — E1165 Type 2 diabetes mellitus with hyperglycemia: Secondary | ICD-10-CM | POA: Diagnosis present

## 2013-06-03 DIAGNOSIS — Z9119 Patient's noncompliance with other medical treatment and regimen: Secondary | ICD-10-CM

## 2013-06-03 DIAGNOSIS — I252 Old myocardial infarction: Secondary | ICD-10-CM

## 2013-06-03 DIAGNOSIS — Z9861 Coronary angioplasty status: Secondary | ICD-10-CM

## 2013-06-03 HISTORY — DX: Presence of coronary angioplasty implant and graft: Z95.5

## 2013-06-03 HISTORY — DX: Atherosclerotic heart disease of native coronary artery without angina pectoris: I25.10

## 2013-06-03 HISTORY — DX: Hyperlipidemia, unspecified: E78.5

## 2013-06-03 HISTORY — DX: Gastro-esophageal reflux disease without esophagitis: K21.9

## 2013-06-03 LAB — BASIC METABOLIC PANEL
CO2: 22 mEq/L (ref 19–32)
Calcium: 9.7 mg/dL (ref 8.4–10.5)
Creatinine, Ser: 0.55 mg/dL (ref 0.50–1.10)
GFR calc non Af Amer: >90 mL/min (ref >90)

## 2013-06-03 LAB — PRO B NATRIURETIC PEPTIDE: Pro B Natriuretic peptide (BNP): 86.4 pg/mL (ref 0–125)

## 2013-06-03 LAB — PROTIME-INR
INR: 1.04 (ref 0.00–1.49)
Prothrombin Time: 13.5 seconds (ref 11.6–15.2)

## 2013-06-03 LAB — CBC
HCT: 45.8 % (ref 36.0–46.0)
MCHC: 34.5 g/dL (ref 30.0–36.0)
Platelets: 259 10*3/uL (ref 150–400)
RDW: 12.6 % (ref 11.5–15.5)

## 2013-06-03 LAB — POCT I-STAT TROPONIN I

## 2013-06-03 MED ORDER — SODIUM CHLORIDE 0.9 % IJ SOLN: 3.0000 mL | Freq: Two times a day (BID) | INTRAMUSCULAR | Status: DC

## 2013-06-03 MED ORDER — ENOXAPARIN SODIUM 40 MG/0.4ML ~~LOC~~ SOLN: 40.0000 mg | SUBCUTANEOUS | Status: DC

## 2013-06-03 MED ORDER — SODIUM CHLORIDE 0.9 % IV SOLN
1000.0000 mL | Freq: Once | INTRAVENOUS | Status: AC
  Administered 2013-06-03: 1000 mL via INTRAVENOUS

## 2013-06-03 MED ORDER — INSULIN GLARGINE 100 UNIT/ML ~~LOC~~ SOLN
10.0000 [IU] | Freq: Every day | SUBCUTANEOUS | Status: DC
  Administered 2013-06-03: 10 [IU] via SUBCUTANEOUS
  Filled 2013-06-03: qty 0.1

## 2013-06-03 MED ORDER — HEPARIN BOLUS VIA INFUSION
4000.0000 [IU] | Freq: Once | INTRAVENOUS | Status: AC
  Administered 2013-06-03: 4000 [IU] via INTRAVENOUS
  Filled 2013-06-03: qty 4000

## 2013-06-03 MED ORDER — SODIUM CHLORIDE 0.9 % IJ SOLN: 3.0000 mL | INTRAMUSCULAR | Status: DC | PRN

## 2013-06-03 MED ORDER — MORPHINE SULFATE 2 MG/ML IJ SOLN
2.0000 mg | INTRAMUSCULAR | Status: DC | PRN
  Administered 2013-06-04 – 2013-06-07 (×10): 2 mg via INTRAVENOUS
  Filled 2013-06-03 (×11): qty 1

## 2013-06-03 MED ORDER — ATORVASTATIN CALCIUM 80 MG PO TABS
80.0000 mg | ORAL_TABLET | Freq: Every day | ORAL | Status: DC
  Administered 2013-06-03 – 2013-06-09 (×7): 80 mg via ORAL
  Filled 2013-06-03 (×9): qty 1

## 2013-06-03 MED ORDER — ASPIRIN 325 MG PO TABS
325.0000 mg | ORAL_TABLET | Freq: Once | ORAL | Status: AC
  Administered 2013-06-03: 325 mg via ORAL
  Filled 2013-06-03: qty 1

## 2013-06-03 MED ORDER — INSULIN ASPART 100 UNIT/ML ~~LOC~~ SOLN
10.0000 [IU] | Freq: Once | SUBCUTANEOUS | Status: AC
  Administered 2013-06-03: 10 [IU] via INTRAVENOUS
  Filled 2013-06-03: qty 1

## 2013-06-03 MED ORDER — SODIUM CHLORIDE 0.9 % IV SOLN: 250.0000 mL | INTRAVENOUS | Status: DC | PRN

## 2013-06-03 MED ORDER — INSULIN ASPART 100 UNIT/ML ~~LOC~~ SOLN
0.0000 [IU] | Freq: Three times a day (TID) | SUBCUTANEOUS | Status: DC
  Administered 2013-06-04 – 2013-06-05 (×4): 5 [IU] via SUBCUTANEOUS
  Administered 2013-06-05: 3 [IU] via SUBCUTANEOUS
  Administered 2013-06-05: 5 [IU] via SUBCUTANEOUS
  Administered 2013-06-06: 3 [IU] via SUBCUTANEOUS
  Administered 2013-06-06: 5 [IU] via SUBCUTANEOUS
  Administered 2013-06-06 – 2013-06-07 (×2): 3 [IU] via SUBCUTANEOUS
  Administered 2013-06-07: 1 [IU] via SUBCUTANEOUS
  Administered 2013-06-07: 2 [IU] via SUBCUTANEOUS
  Administered 2013-06-08: 13:00:00 3 [IU] via SUBCUTANEOUS
  Administered 2013-06-08: 1 [IU] via SUBCUTANEOUS
  Administered 2013-06-08: 3 [IU] via SUBCUTANEOUS
  Administered 2013-06-09: 1 [IU] via SUBCUTANEOUS

## 2013-06-03 MED ORDER — NITROGLYCERIN 0.4 MG SL SUBL
0.4000 mg | SUBLINGUAL_TABLET | SUBLINGUAL | Status: DC | PRN
  Filled 2013-06-03: qty 25

## 2013-06-03 MED ORDER — HYDROCODONE-ACETAMINOPHEN 5-325 MG PO TABS
1.0000 | ORAL_TABLET | Freq: Once | ORAL | Status: AC
  Administered 2013-06-03: 1 via ORAL
  Filled 2013-06-03: qty 1

## 2013-06-03 MED ORDER — PNEUMOCOCCAL VAC POLYVALENT 25 MCG/0.5ML IJ INJ
0.5000 mL | INJECTION | INTRAMUSCULAR | Status: AC
  Administered 2013-06-04: 0.5 mL via INTRAMUSCULAR
  Filled 2013-06-03 (×2): qty 0.5

## 2013-06-03 MED ORDER — SODIUM CHLORIDE 0.9 % IV SOLN
1000.0000 mL | INTRAVENOUS | Status: DC
  Administered 2013-06-03 – 2013-06-06 (×6): 1000 mL via INTRAVENOUS

## 2013-06-03 MED ORDER — METOPROLOL TARTRATE 12.5 MG HALF TABLET
12.5000 mg | ORAL_TABLET | Freq: Two times a day (BID) | ORAL | Status: DC
  Administered 2013-06-03 – 2013-06-04 (×2): 12.5 mg via ORAL
  Filled 2013-06-03 (×3): qty 1

## 2013-06-03 MED ORDER — INSULIN ASPART 100 UNIT/ML ~~LOC~~ SOLN
3.0000 [IU] | Freq: Three times a day (TID) | SUBCUTANEOUS | Status: DC
  Administered 2013-06-04 – 2013-06-09 (×15): 3 [IU] via SUBCUTANEOUS

## 2013-06-03 MED ORDER — HEPARIN (PORCINE) IN NACL 100-0.45 UNIT/ML-% IJ SOLN
900.0000 [IU]/h | INTRAMUSCULAR | Status: DC
  Administered 2013-06-03: 900 [IU]/h via INTRAVENOUS
  Filled 2013-06-03: qty 250

## 2013-06-03 MED ORDER — ASPIRIN EC 81 MG PO TBEC
81.0000 mg | DELAYED_RELEASE_TABLET | Freq: Every day | ORAL | Status: DC
  Administered 2013-06-04 – 2013-06-09 (×5): 81 mg via ORAL
  Filled 2013-06-03 (×6): qty 1

## 2013-06-03 NOTE — ED Notes (Signed)
MD at bedside. 

## 2013-06-03 NOTE — ED Provider Notes (Signed)
History     CSN: [id number]  Arrival date & time 06/03/13  1729   First MD Initiated Contact with Patient 06/03/13 1739      Chief Complaint  Patient presents with  . Chest Pain     HPI The patient has a history of myocardial infarction status post circumflex taxus stent in July 2006 in [place].  The patient now presents with 24 hours of intermittent chest pain described as a heaviness and pressure with radiation to her left arm brought on by exertion.  She has associated diaphoresis and shortness of breath when this occurs.  Currently she is without any pain.  She no longer takes aspirin and Plavix or insulin.  Has a history of menorrhagia transfusion 1 has as well as a history of peptic ulcer disease.    Past Medical History  Diagnosis Date  . Diabetes mellitus without complication   . Heart attack     History reviewed. No pertinent past surgical history.  History reviewed. No pertinent family history.  History  Substance Use Topics  . Smoking status: Former Games developer  . Smokeless tobacco: Not on file  . Alcohol Use: Yes     Comment: ocassionally     OB History   Grav Para Term Preterm Abortions TAB SAB Ect Mult Living                  Review of Systems  All other systems reviewed and are negative.    Allergies  Review of patient's allergies indicates no known allergies.  Home Medications   Current Outpatient Rx  Name  Route  Sig  Dispense  Refill  . acetaminophen (TYLENOL) 500 MG tablet   Oral   Take 2,000 mg by mouth every 6 (six) hours as needed for pain.         Marland Kitchen HYDROcodone-acetaminophen (NORCO/VICODIN) 5-325 MG per tablet      Take 1-2 tablets every 6 hours as needed for severe pain   10 tablet   0   . sulfamethoxazole-trimethoprim (SEPTRA DS) 800-160 MG per tablet   Oral   Take 1 tablet by mouth every 12 (twelve) hours.   14 tablet   0     BP 193/98  Pulse 86  Temp(Src) 98.2 F (36.8 C) (Oral)  Resp 18  SpO2 98%  Physical Exam   Nursing note and vitals reviewed. Constitutional: She is oriented to person, place, and time. She appears well-developed and well-nourished. No distress.  HENT:  Head: Normocephalic and atraumatic.  Eyes: EOM are normal.  Neck: Normal range of motion.  Cardiovascular: Normal rate, regular rhythm and normal heart sounds.   Pulmonary/Chest: Effort normal and breath sounds normal.  Abdominal: Soft. She exhibits no distension. There is no tenderness.  Musculoskeletal: Normal range of motion.  Neurological: She is alert and oriented to person, place, and time.  Skin: Skin is warm and dry.  Psychiatric: She has a normal mood and affect. Judgment normal.    ED Course  Procedures (including critical care time)   Date: 06/03/2013  Rate: 84  Rhythm: normal sinus rhythm  QRS Axis: normal  Intervals: normal  ST/T Wave abnormalities: normal  Conduction Disutrbances: none  Narrative Interpretation:   Old EKG Reviewed: No significant changes noted     Labs Reviewed  BASIC METABOLIC PANEL - Abnormal; Notable for the following:    Sodium 129 (*)    Chloride 93 (*)    Glucose, Bld 550 (*)    All  other components within normal limits  CBC - Abnormal; Notable for the following:    RBC 5.32 (*)    Hemoglobin 15.8 (*)    All other components within normal limits  PRO B NATRIURETIC PEPTIDE  POCT I-STAT TROPONIN I   Dg Chest Port 1 View  06/03/2013   *RADIOLOGY REPORT*  Clinical Data: Chest pain and short of breath  PORTABLE CHEST - 1 VIEW  Comparison: 01/27/2010  Findings: Mild elevation right hemidiaphragm, unchanged.  Lungs are clear.  Negative for heart failure or pneumonia.  No pleural effusion or mass.  IMPRESSION: No acute abnormality.  No interval change.   Original Report Authenticated By: Janeece Riggers, M.D.   I personally reviewed the imaging tests through PACS system I reviewed available ER/hospitalization records through the EMR   1. Chest pain       MDM  Patient  presents with symptoms concerning for unstable angina.  Aspirin given in emergency apartment.  No active chest pain at this time.  EKG and troponin are without significant abnormalities.  The patient's had noncompliance with her aspirin, Plavix, insulin.  Her blood sugar here is 550 without signs of elevated anion gap.  Patient be admitted to the hospitalists        Lyanne Co, MD 06/03/13 1940

## 2013-06-03 NOTE — ED Notes (Addendum)
Pt states she has been having chest pain that feels like a heaviness in her chest x2 days. Pt rates pain 7/10. Pt states she is a diabetic and has not taken medication in over a year.

## 2013-06-03 NOTE — H&P (Signed)
[facility]  Date: 06/03/2013               Patient Name:  [name] MRN: [id number]  DOB: Oct 05, 1956 Age / Sex: 57 y.o., female   PCP: Provider Not In System              Chief Complaint: Chest pain  History of Present Illness: Patient is a 57 y.o. female with a PMHx of CAD with stent in left Cx on 2006, diabetes and HTN, who presents to [facility] for evaluation of acute onset chest pain that began 1 day prior to the day of admission while the patient was sitting at home. Pain is described as a heaviness, pressure chest pain located over mid chest and left chest. The pain is rated as 8-9/10 in severity at onset and currently rated 5/10. The pain is notably aggravated by walking. It is alleviated by rest. It raduates to left arm and neck. Has associated diaphoresis and shortness of breath. For the pain, the patient tried nothing for pain relief. Patient has been unable to walk more few feet since the onset of chest pain due to associated shortness of breath. Previously she had no reported physical activity limitations.   She has been non complaint with all her medications and does not have a PCP.   The patient has experienced similar symptoms previously when she had her heart attack in 2006. Secondary to her constellation of symptoms, Ms. [name] presented to [facility] for further evaluation. Otherwise, the patient denies nausea, near syncope, palpitations and syncope.   Current Outpatient Medications: Prescriptions prior to admission  Medication Sig Dispense Refill  . acetaminophen (TYLENOL) 500 MG tablet Take 2,000 mg by mouth every 6 (six) hours as needed for pain.        Allergies: No Known Allergies   Past Medical History: Past Medical History  Diagnosis Date  . Diabetes mellitus without complication   . Heart attack   . GERD (gastroesophageal reflux disease)     Past Surgical History: Past Surgical History  Procedure Laterality Date  . Coronary stent placement   approx 2005    1 stent    Family History: History reviewed. No pertinent family history.  Social History: History   Social History  . Marital Status: Divorced    Spouse Name: N/A    Number of Children: N/A  . Years of Education: N/A   Occupational History  . Not on file.   Social History Main Topics  . Smoking status: Former Smoker -- 25 years    Quit date: 07/03/2004  . Smokeless tobacco: Never Used  . Alcohol Use: Yes     Comment: ocassionally   . Drug Use: No  . Sexually Active: Not on file   Other Topics Concern  . Not on file   Social History Narrative  . No narrative on file    Review of Systems: Constitutional:  denies fever, chills, diaphoresis, appetite change and fatigue.  HEENT: denies photophobia, eye pain, redness, hearing loss, ear pain, congestion, sore throat, rhinorrhea, sneezing, neck pain, neck stiffness and tinnitus.  Respiratory: admits to SOB, denies DOE, cough, chest tightness, and wheezing.  Cardiovascular: admits to chest pain, denies palpitations and leg swelling.  Gastrointestinal: denies nausea, vomiting, abdominal pain, diarrhea, constipation, blood in stool.  Genitourinary: denies dysuria, urgency, frequency, hematuria, flank pain and difficulty urinating.  Musculoskeletal: denies  myalgias, back pain, joint swelling, arthralgias and gait problem.   Skin: denies pallor, rash  and wound.  Neurological: denies dizziness, seizures, syncope, weakness, light-headedness, numbness and headaches.   Hematological: denies adenopathy, easy bruising, personal or family bleeding history.  Psychiatric/ Behavioral: denies suicidal ideation, mood changes, confusion, nervousness, sleep disturbance and agitation.    Vital Signs: Blood pressure 180/96, pulse 86, temperature 97.8 F (36.6 C), temperature source Oral, resp. rate 16, SpO2 100.00%.  Physical Exam: General: Vital signs reviewed and noted. Well-developed, well-nourished, in no acute  distress; alert, appropriate and cooperative throughout examination.  Head: Normocephalic, atraumatic.  Eyes: PERRL, EOMI, No signs of anemia or jaundince.  Nose: Mucous membranes moist, not inflammed, nonerythematous.  Throat: Oropharynx nonerythematous, no exudate appreciated.   Neck: No deformities, masses, or tenderness noted.Supple, No carotid Bruits, elevated JVD 6-7cm  Lungs:  Normal respiratory effort. Clear to auscultation BL without crackles or wheezes.  Heart: RRR. S1 and S2 normal without gallop, murmur, or rubs. Some TTP over mid sternum  Abdomen:  BS normoactive. Soft, Nondistended, non-tender.  No masses or organomegaly.  Extremities: No pretibial edema.  Neurologic: A&O X3, CN II - XII are grossly intact. Motor strength is 5/5 in the all 4 extremities, Sensations intact to light touch, Cerebellar signs negative.  Skin: No visible rashes, scars.    Lab results: Basic Metabolic Panel:  Recent Labs  16/10/96 1744  NA 129*  K 3.7  CL 93*  CO2 22  GLUCOSE 550*  BUN 11  CREATININE 0.55  CALCIUM 9.7   CBC:  Recent Labs  06/03/13 1744  WBC 6.6  HGB 15.8*  HCT 45.8  MCV 86.1  PLT 259   BNP:  Recent Labs  06/03/13 1744  PROBNP 86.4     Imaging results:  Dg Chest Port 1 View  06/03/2013   *RADIOLOGY REPORT*  Clinical Data: Chest pain and short of breath  PORTABLE CHEST - 1 VIEW  Comparison: 01/27/2010  Findings: Mild elevation right hemidiaphragm, unchanged.  Lungs are clear.  Negative for heart failure or pneumonia.  No pleural effusion or mass.  IMPRESSION: No acute abnormality.  No interval change.   Original Report Authenticated By: Janeece Riggers, M.D.     Other results:  EKG (06/03/2013) - Normal Sinus Rhythm , regular rate of approximately 84 bpm, normal axis, ST segments: normal.   Stress test 01/30/2010 IMPRESSION:  1. No clear evidence reversible ischemia. No evidence of  infarction. Photopenic region within the anterior septal wall is  felt to  be related to motion artifact rather than true perfusion  defect. There is no focal wall motion abnormality within the  region.  2. No wall motion abnormality. Normal contractility  3. Left ventricular ejection fraction equal 69%    Last Echo not known   Last Cath not known    Assessment & Plan: Pt is a 57 y.o. yo female with a PMHX of DM, HTN and CAD, who presented to [facility] on 06/03/2013 with symptoms of mid chest chest pain, with EKG showing NSR and initial troponins of Negative. Therefore, interventions at this time will be focused on risk factor stratification.    Unstable angina - Patient does have known history of coronary artery disease. She also has cardiac risk factors including the following: diabetic  existing CAD  hypertension  sedentary lifestyle and non compliance with medications. History is really concerning especially with ongoing chest pain. Admission EKG showed NSR, and cardiac enzymes have been negative. Therefore, management at this time will be focused on symptom management. We will also strict risk factor modification and  management.   Admit to telemetry  Continue to cycle cardiac enzymes.  Continue aspirin, beta blocker, statin therapy.  Continue morphine, oxygen, PRN nitroglycerin, Heparin drip until cardiac enzymes are negative  I will consult cardiology on call  Check HgA1c for risk factor stratification  SSI for diabetes    DVT PPX - low molecular weight heparin  CODE STATUS - full  CONSULTS PLACED - cardiology DISPO - Disposition is deferred    Signed: Lars Mage, MD   06/03/2013, 9:37 PM

## 2013-06-03 NOTE — Progress Notes (Signed)
ANTICOAGULATION CONSULT NOTE - Initial Consult  Pharmacy Consult for Heparin Indication: chest pain/ACS  No Known Allergies  Patient Measurements: Height: 5\' 2"  (157.5 cm) Weight: 218 lb (98.884 kg) IBW/kg (Calculated) : 50.1 Heparin Dosing Weight: 73 kg   Vital Signs: Temp: 97.8 F (36.6 C) (06/19 2048) Temp src: Oral (06/19 2048) BP: 180/96 mmHg (06/19 2048) Pulse Rate: 86 (06/19 1740)  Labs:  Recent Labs  06/03/13 1744  HGB 15.8*  HCT 45.8  PLT 259  CREATININE 0.55    Estimated Creatinine Clearance: 88.3 ml/min (by C-G formula based on Cr of 0.55).   Medical History: Past Medical History  Diagnosis Date  . Diabetes mellitus without complication   . Heart attack   . GERD (gastroesophageal reflux disease)     Medications:  Scheduled:  . [START ON 06/04/2013] aspirin EC  81 mg Oral Daily  . [START ON 06/04/2013] atorvastatin  80 mg Oral q1800  . heparin  4,000 Units Intravenous Once  . [START ON 06/04/2013] insulin aspart  0-9 Units Subcutaneous TID WC  . [START ON 06/04/2013] insulin aspart  3 Units Subcutaneous TID WC  . insulin glargine  10 Units Subcutaneous QHS  . metoprolol tartrate  12.5 mg Oral BID  . [START ON 06/04/2013] pneumococcal 23 valent vaccine  0.5 mL Intramuscular Tomorrow-1000  . sodium chloride  3 mL Intravenous Q12H  . sodium chloride  3 mL Intravenous Q12H   Infusions:  . sodium chloride 1,000 mL (06/03/13 2114)  . heparin      Assessment:  57 year old female with h/o CAD with stent.  Chief complaint of acute chest pain.  IV heparin to begin until cardiac enzymes are negative per H&P; cardiology to be consulted  No baseline coags available at this time - only PTA med documented = APAP  Goal of Therapy:  Heparin level 0.3-0.7 units/ml Monitor platelets by anticoagulation protocol: Yes   Plan:   Check baseline PTT and PT/INR  Begin heparin 4000 unit IV bolus x 1 followed by drip @ 900 units/hr  Check heparin level 6 hrs  after drip started  Check daily heparin level and CBC while on heparin  Ivanna Kocak, Joselyn Glassman, PharmD 06/03/2013,10:47 PM

## 2013-06-03 NOTE — ED Notes (Signed)
Attempted to call report RN unavailable at this time will call back 

## 2013-06-04 DIAGNOSIS — I10 Essential (primary) hypertension: Secondary | ICD-10-CM | POA: Diagnosis present

## 2013-06-04 DIAGNOSIS — I2510 Atherosclerotic heart disease of native coronary artery without angina pectoris: Principal | ICD-10-CM

## 2013-06-04 DIAGNOSIS — I200 Unstable angina: Secondary | ICD-10-CM

## 2013-06-04 DIAGNOSIS — R072 Precordial pain: Secondary | ICD-10-CM

## 2013-06-04 DIAGNOSIS — E1165 Type 2 diabetes mellitus with hyperglycemia: Secondary | ICD-10-CM | POA: Diagnosis present

## 2013-06-04 HISTORY — DX: Atherosclerotic heart disease of native coronary artery without angina pectoris: I25.10

## 2013-06-04 LAB — BASIC METABOLIC PANEL
BUN: 9 mg/dL (ref 6–23)
CO2: 28 mEq/L (ref 19–32)
Chloride: 101 mEq/L (ref 96–112)
Creatinine, Ser: 0.55 mg/dL (ref 0.50–1.10)
GFR calc Af Amer: >90 mL/min (ref >90)
Glucose, Bld: 315 mg/dL — ABNORMAL HIGH (ref 70–99)
Potassium: 3.3 mEq/L — ABNORMAL LOW (ref 3.5–5.1)

## 2013-06-04 LAB — CBC
HCT: 40.5 % (ref 36.0–46.0)
Hemoglobin: 13.9 g/dL (ref 12.0–15.0)
MCHC: 34.3 g/dL (ref 30.0–36.0)
MCV: 84.9 fL (ref 78.0–100.0)
RDW: 12.6 % (ref 11.5–15.5)

## 2013-06-04 LAB — LIPID PANEL
Cholesterol: 199 mg/dL (ref 0–200)
Total CHOL/HDL Ratio: 4.4 RATIO
Triglycerides: 155 mg/dL — ABNORMAL HIGH (ref <150)
VLDL: 31 mg/dL (ref 0–40)

## 2013-06-04 LAB — HEPARIN LEVEL (UNFRACTIONATED)
Heparin Unfractionated: 0.38 IU/mL (ref 0.30–0.70)
Heparin Unfractionated: <0.1 IU/mL — ABNORMAL LOW (ref 0.30–0.70)

## 2013-06-04 LAB — HEMOGLOBIN A1C: Mean Plasma Glucose: 332 mg/dL — ABNORMAL HIGH (ref <117)

## 2013-06-04 LAB — GLUCOSE, CAPILLARY
Glucose-Capillary: 279 mg/dL — ABNORMAL HIGH (ref 70–99)
Glucose-Capillary: 300 mg/dL — ABNORMAL HIGH (ref 70–99)

## 2013-06-04 LAB — TROPONIN I: Troponin I: <0.3 ng/mL (ref <0.30)

## 2013-06-04 MED ORDER — ONDANSETRON HCL 4 MG/2ML IJ SOLN
4.0000 mg | Freq: Four times a day (QID) | INTRAMUSCULAR | Status: DC | PRN
  Administered 2013-06-06: 4 mg via INTRAVENOUS
  Filled 2013-06-04: qty 2

## 2013-06-04 MED ORDER — ACETAMINOPHEN 325 MG PO TABS
650.0000 mg | ORAL_TABLET | ORAL | Status: DC | PRN
  Administered 2013-06-04 – 2013-06-07 (×4): 650 mg via ORAL
  Filled 2013-06-04 (×4): qty 2

## 2013-06-04 MED ORDER — METOPROLOL TARTRATE 25 MG PO TABS
25.0000 mg | ORAL_TABLET | Freq: Two times a day (BID) | ORAL | Status: DC
  Administered 2013-06-04: 25 mg via ORAL
  Filled 2013-06-04 (×3): qty 1

## 2013-06-04 MED ORDER — HYDRALAZINE HCL 25 MG PO TABS
25.0000 mg | ORAL_TABLET | Freq: Three times a day (TID) | ORAL | Status: DC
  Administered 2013-06-04 – 2013-06-05 (×3): 25 mg via ORAL
  Filled 2013-06-04 (×7): qty 1

## 2013-06-04 MED ORDER — HEPARIN (PORCINE) IN NACL 100-0.45 UNIT/ML-% IJ SOLN
1600.0000 [IU]/h | INTRAMUSCULAR | Status: DC
  Administered 2013-06-04: 1050 [IU]/h via INTRAVENOUS
  Administered 2013-06-05: 1450 [IU]/h via INTRAVENOUS
  Administered 2013-06-05: 1400 [IU]/h via INTRAVENOUS
  Administered 2013-06-06: 1600 [IU]/h via INTRAVENOUS
  Filled 2013-06-04 (×9): qty 250

## 2013-06-04 MED ORDER — HYDRALAZINE HCL 20 MG/ML IJ SOLN
10.0000 mg | Freq: Once | INTRAMUSCULAR | Status: AC
  Administered 2013-06-04: 10 mg via INTRAVENOUS
  Filled 2013-06-04: qty 1

## 2013-06-04 MED ORDER — INSULIN GLARGINE 100 UNIT/ML ~~LOC~~ SOLN
20.0000 [IU] | Freq: Every day | SUBCUTANEOUS | Status: DC
  Administered 2013-06-04: 20 [IU] via SUBCUTANEOUS
  Filled 2013-06-04 (×3): qty 0.2

## 2013-06-04 NOTE — Progress Notes (Signed)
Report called to [facility] at [facility] 2000.  Pt transported by [facility] to [facility].

## 2013-06-04 NOTE — Progress Notes (Addendum)
[facility] PROGRESS NOTE  [name] ZOX:[id number] DOB: 02-11-1956 DOA: 06/03/2013 PCP: Provider Not In System  Brief narrative: 57 y.o. female with a PMHx of CAD with stent in left Cx in 05, uncontrolled diabetes obesity, and HTN who has not been having adequate medical follow up and not taking medications for several months as she could not afford them,  presented to [facility] for evaluation of acute onset chest pain that began 1 day prior to the day of admission while the patient was sitting at home. Pain is described as a heaviness, pressure chest pain located over mid chest and left chest. The pain is rated as 8-9/10 in severity at onset and currently rated 5/10. The pain is notably aggravated by walking. It is alleviated by rest. It raduated to left arm and neck. She had  associated diaphoresis and shortness of breath.  Initial EKG and troponins negative. Given symptoms of pain quite concerning and typical for cardiac in nature patient admitted for unstable angina.   Assessment/Plan: Unstable angina Chest pain free at present. Stable on tele. troponin x 2 negative.  continue aspirin, statin, metoprolol and s/l nitro. Started on IV heparin drip. Patient has not been seeking medical care and taking her medications for several months. Last cardiology evaluation was in 2009 by Dr Allyson Sabal. [facility] and has not had access to medical care after it closed.  discussed with [facility] [place], [place]) who recommended [facility] for possible cath. -keep patient NPO. -Check 2D echo. patient counseled on medication adherence and adequate follow up as outpt. She should benefit from follow up at the [facility] ( [address] after discharge for ongoing care along with f/up with cardiologist.   Uncontrolled DM  not on any medications. fsg was >400s on presentation.  A1C of 13.2 Continue SSI Start on lantus 20 units at bedtime. Diabetic coordinator  consult. Will need insulin on discharge.   Hyperlipidemia  LDL of 123. Started on statin   patient will be transferred to cardiac telemetry at [facility]. Accepting physician is Dr Nanetta Batty.   Code Status:full  Family Communication: daughter at bedside Disposition Plan: transfer to [facility]   Consultants:    Procedures:  none  Antibiotics:  none  HPI/Subjective: Admission H&P reviewed. patient chest pain free   Objective: Filed Vitals:   06/03/13 2048 06/03/13 2237 06/04/13 0010 06/04/13 0645  BP: 180/96  158/92 152/87  Pulse:    76  Temp: 97.8 F (36.6 C)   97.9 F (36.6 C)  TempSrc: Oral   Oral  Resp: 16   18  Height:  5\' 2"  (1.575 m)    Weight:  98.884 kg (218 lb)    SpO2: 100%   95%    Intake/Output Summary (Last 24 hours) at 06/04/13 0855 Last data filed at 06/04/13 0701  Gross per 24 hour  Intake 1297.11 ml  Output      0 ml  Net 1297.11 ml   Filed Weights   06/03/13 2237  Weight: 98.884 kg (218 lb)    Exam:   General:  Middle aged obese female in NAD  HEENT: no pallor, moist mucosa  Cardiovascular: NS1&S2, no murmurs  Respiratory: clear b/l, no added sounds  Abdomen: soft, NT, ND, bs+  Musculoskeletal: Warm, no edema  C NS: AAOX3  Data Reviewed: Basic Metabolic Panel:  Recent Labs Lab 06/03/13 1744 06/04/13 0505  NA 129* 136  K 3.7 3.3*  CL  93* 101  CO2 22 28  GLUCOSE 550* 315*  BUN 11 9  CREATININE 0.55 0.55  CALCIUM 9.7 8.9   Liver Function Tests: No results found for this basename: AST, ALT, ALKPHOS, BILITOT, PROT, ALBUMIN,  in the last 168 hours No results found for this basename: LIPASE, AMYLASE,  in the last 168 hours No results found for this basename: AMMONIA,  in the last 168 hours CBC:  Recent Labs Lab 06/03/13 1744 06/04/13 0505  WBC 6.6 6.1  HGB 15.8* 13.9  HCT 45.8 40.5  MCV 86.1 84.9  PLT 259 229   Cardiac Enzymes:  Recent Labs Lab 06/03/13 2300 06/04/13 0505  TROPONINI <0.30 <0.30   BNP  (last 3 results)  Recent Labs  06/03/13 1744  PROBNP 86.4   CBG:  Recent Labs Lab 06/04/13 0738  GLUCAP 279*    No results found for this or any previous visit (from the past 240 hour(s)).   Studies: Dg Chest Port 1 View  06/03/2013   *RADIOLOGY REPORT*  Clinical Data: Chest pain and short of breath  PORTABLE CHEST - 1 VIEW  Comparison: 01/27/2010  Findings: Mild elevation right hemidiaphragm, unchanged.  Lungs are clear.  Negative for heart failure or pneumonia.  No pleural effusion or mass.  IMPRESSION: No acute abnormality.  No interval change.   Original Report Authenticated By: Janeece Riggers, M.D.    Scheduled Meds: . aspirin EC  81 mg Oral Daily  . atorvastatin  80 mg Oral q1800  . insulin aspart  0-9 Units Subcutaneous TID WC  . insulin aspart  3 Units Subcutaneous TID WC  . insulin glargine  10 Units Subcutaneous QHS  . metoprolol tartrate  12.5 mg Oral BID  . pneumococcal 23 valent vaccine  0.5 mL Intramuscular Tomorrow-1000  . sodium chloride  3 mL Intravenous Q12H  . sodium chloride  3 mL Intravenous Q12H   Continuous Infusions: . sodium chloride 1,000 mL (06/03/13 2114)  . heparin 1,050 Units/hr (06/04/13 0557)     Time spent: 25 minutes    [name]  [facility] Pager [phone]. If 7PM-7AM, please contact night-coverage at [email or web address], password [email or web address] 06/04/2013, 8:55 AM  LOS: 1 day

## 2013-06-04 NOTE — Consult Note (Signed)
Reason for Consult: Unstable angina Referring Physician:   GLESSIE EUSTICE is an 57 y.o. female.  HPI:   The patient is a morbidly obese 57 year old female with diabetes mellitus, coronary artery disease, hypertension, gastroesophageal reflux disease, tobacco abuse (quit 2006). She apparently had a myocardial infarction in [place] with a stent placed to the circumflex artery. She has not followed up at [facility] vascular since she was seen at 2009.  At that time she was having chest pain in the setting of a GI bleed which required 2 units of packed red blood cells.  Patient presented to [facility] with complaints of chest pain she described as tightness. It was 9/10 in intensity associated with nausea and diaphoresis. She reportsit was worsened with exertion as was her dyspnea. She has not taken any medications in the last year and a half one of which was Plavix  She currently denies shortness of breath, vomiting, fever, orthopnea, lower extremity edema, dizziness, cough, congestion, hematochezia, melena, dysuria, abdominal pain.  She is currently chest pain-free and on heparin.  She also reports significant weight lose without really trying.  Past Medical History  Diagnosis Date  . Diabetes mellitus without complication   . Heart attack   . GERD (gastroesophageal reflux disease)     Past Surgical History  Procedure Laterality Date  . Coronary stent placement  approx 2005    1 stent    History reviewed. No pertinent family history.  Social History:  reports that she quit smoking about 8 years ago. She has never used smokeless tobacco. She reports that  drinks alcohol. She reports that she does not use illicit drugs.  Allergies: No Known Allergies  Medications: no home meds  Results for orders placed during the hospital encounter of 06/03/13 (from the past 48 hour(s))  BASIC METABOLIC PANEL     Status: Abnormal   Collection Time    06/03/13  5:44 PM       Result Value Range   Sodium 129 (*) 135 - 145 mEq/L   Potassium 3.7  3.5 - 5.1 mEq/L   Chloride 93 (*) 96 - 112 mEq/L   CO2 22  19 - 32 mEq/L   Glucose, Bld 550 (*) 70 - 99 mg/dL   BUN 11  6 - 23 mg/dL   Creatinine, Ser 1.19  0.50 - 1.10 mg/dL   Calcium 9.7  8.4 - 14.7 mg/dL   GFR calc non Af Amer >90  >90 mL/min   GFR calc Af Amer >90  >90 mL/min   Comment:            The eGFR has been calculated     using the CKD EPI equation.     This calculation has not been     validated in all clinical     situations.     eGFR's persistently     <90 mL/min signify     possible Chronic Kidney Disease.  PRO B NATRIURETIC PEPTIDE     Status: None   Collection Time    06/03/13  5:44 PM      Result Value Range   Pro B Natriuretic peptide (BNP) 86.4  0 - 125 pg/mL  CBC     Status: Abnormal   Collection Time    06/03/13  5:44 PM      Result Value Range   WBC 6.6  4.0 - 10.5 K/uL   RBC 5.32 (*) 3.87 - 5.11 MIL/uL  Hemoglobin 15.8 (*) 12.0 - 15.0 g/dL   HCT 16.1  09.6 - 04.5 %   MCV 86.1  78.0 - 100.0 fL   MCH 29.7  26.0 - 34.0 pg   MCHC 34.5  30.0 - 36.0 g/dL   RDW 40.9  81.1 - 91.4 %   Platelets 259  150 - 400 K/uL  POCT I-STAT TROPONIN I     Status: None   Collection Time    06/03/13  5:59 PM      Result Value Range   Troponin i, poc 0.01  0.00 - 0.08 ng/mL   Comment 3            Comment: Due to the release kinetics of cTnI,     a negative result within the first hours     of the onset of symptoms does not rule out     myocardial infarction with certainty.     If myocardial infarction is still suspected,     repeat the test at appropriate intervals.  TROPONIN I     Status: None   Collection Time    06/03/13 11:00 PM      Result Value Range   Troponin I <0.30  <0.30 ng/mL   Comment:            Due to the release kinetics of cTnI,     a negative result within the first hours     of the onset of symptoms does not rule out     myocardial infarction with certainty.     If  myocardial infarction is still suspected,     repeat the test at appropriate intervals.  TSH     Status: None   Collection Time    06/03/13 11:00 PM      Result Value Range   TSH 1.264  0.350 - 4.500 uIU/mL  HEMOGLOBIN A1C     Status: Abnormal   Collection Time    06/03/13 11:00 PM      Result Value Range   Hemoglobin A1C 13.2 (*) <5.7 %   Comment: (NOTE)                                                                               According to the ADA Clinical Practice Recommendations for 2011, when     HbA1c is used as a screening test:      >=6.5%   Diagnostic of Diabetes Mellitus               (if abnormal result is confirmed)     5.7-6.4%   Increased risk of developing Diabetes Mellitus     References:Diagnosis and Classification of Diabetes Mellitus,Diabetes     Care,2011,34(Suppl 1):S62-S69 and Standards of Medical Care in             Diabetes - 2011,Diabetes Care,2011,34 (Suppl 1):S11-S61.   Mean Plasma Glucose 332 (*) <117 mg/dL  LIPID PANEL     Status: Abnormal   Collection Time    06/03/13 11:00 PM      Result Value Range   Cholesterol 199  0 - 200 mg/dL   Triglycerides 782 (*) <150 mg/dL   HDL 45  >  39 mg/dL   Total CHOL/HDL Ratio 4.4     VLDL 31  0 - 40 mg/dL   LDL Cholesterol 244 (*) 0 - 99 mg/dL   Comment:            Total Cholesterol/HDL:CHD Risk     Coronary Heart Disease Risk Table                         Men   Women      1/2 Average Risk   3.4   3.3      Average Risk       5.0   4.4      2 X Average Risk   9.6   7.1      3 X Average Risk  23.4   11.0                Use the calculated Patient Ratio     above and the CHD Risk Table     to determine the patient's CHD Risk.                ATP III CLASSIFICATION (LDL):      <100     mg/dL   Optimal      010-272  mg/dL   Near or Above                        Optimal      130-159  mg/dL   Borderline      536-644  mg/dL   High      >034     mg/dL   Very High  APTT     Status: None   Collection Time     06/03/13 11:00 PM      Result Value Range   aPTT 26  24 - 37 seconds  PROTIME-INR     Status: None   Collection Time    06/03/13 11:00 PM      Result Value Range   Prothrombin Time 13.5  11.6 - 15.2 seconds   INR 1.04  0.00 - 1.49  BASIC METABOLIC PANEL     Status: Abnormal   Collection Time    06/04/13  5:05 AM      Result Value Range   Sodium 136  135 - 145 mEq/L   Comment: DELTA CHECK NOTED   Potassium 3.3 (*) 3.5 - 5.1 mEq/L   Chloride 101  96 - 112 mEq/L   Comment: DELTA CHECK NOTED   CO2 28  19 - 32 mEq/L   Glucose, Bld 315 (*) 70 - 99 mg/dL   BUN 9  6 - 23 mg/dL   Creatinine, Ser 7.42  0.50 - 1.10 mg/dL   Calcium 8.9  8.4 - 59.5 mg/dL   GFR calc non Af Amer >90  >90 mL/min   GFR calc Af Amer >90  >90 mL/min   Comment:            The eGFR has been calculated     using the CKD EPI equation.     This calculation has not been     validated in all clinical     situations.     eGFR's persistently     <90 mL/min signify     possible Chronic Kidney Disease.  CBC     Status: None   Collection Time    06/04/13  5:05 AM  Result Value Range   WBC 6.1  4.0 - 10.5 K/uL   RBC 4.77  3.87 - 5.11 MIL/uL   Hemoglobin 13.9  12.0 - 15.0 g/dL   HCT 78.2  95.6 - 21.3 %   MCV 84.9  78.0 - 100.0 fL   MCH 29.1  26.0 - 34.0 pg   MCHC 34.3  30.0 - 36.0 g/dL   RDW 08.6  57.8 - 46.9 %   Platelets 229  150 - 400 K/uL  TROPONIN I     Status: None   Collection Time    06/04/13  5:05 AM      Result Value Range   Troponin I <0.30  <0.30 ng/mL   Comment:            Due to the release kinetics of cTnI,     a negative result within the first hours     of the onset of symptoms does not rule out     myocardial infarction with certainty.     If myocardial infarction is still suspected,     repeat the test at appropriate intervals.  HEPARIN LEVEL (UNFRACTIONATED)     Status: Abnormal   Collection Time    06/04/13  5:05 AM      Result Value Range   Heparin Unfractionated 0.27 (*) 0.30  - 0.70 IU/mL   Comment:            IF HEPARIN RESULTS ARE BELOW     EXPECTED VALUES, AND PATIENT     DOSAGE HAS BEEN CONFIRMED,     SUGGEST FOLLOW UP TESTING     OF ANTITHROMBIN III LEVELS.  GLUCOSE, CAPILLARY     Status: Abnormal   Collection Time    06/04/13  7:38 AM      Result Value Range   Glucose-Capillary 279 (*) 70 - 99 mg/dL  HEPARIN LEVEL (UNFRACTIONATED)     Status: None   Collection Time    06/04/13 11:48 AM      Result Value Range   Heparin Unfractionated 0.38  0.30 - 0.70 IU/mL   Comment:            IF HEPARIN RESULTS ARE BELOW     EXPECTED VALUES, AND PATIENT     DOSAGE HAS BEEN CONFIRMED,     SUGGEST FOLLOW UP TESTING     OF ANTITHROMBIN III LEVELS.  GLUCOSE, CAPILLARY     Status: Abnormal   Collection Time    06/04/13 12:44 PM      Result Value Range   Glucose-Capillary 267 (*) 70 - 99 mg/dL    Dg Chest Port 1 View  06/03/2013   *RADIOLOGY REPORT*  Clinical Data: Chest pain and short of breath  PORTABLE CHEST - 1 VIEW  Comparison: 01/27/2010  Findings: Mild elevation right hemidiaphragm, unchanged.  Lungs are clear.  Negative for heart failure or pneumonia.  No pleural effusion or mass.  IMPRESSION: No acute abnormality.  No interval change.   Original Report Authenticated By: Janeece Riggers, M.D.    Review of Systems  Constitutional: Positive for diaphoresis. Negative for fever.  HENT: Negative for congestion and sore throat.   Respiratory: Positive for cough. Negative for shortness of breath.        + DOE  Cardiovascular: Positive for chest pain. Negative for palpitations, orthopnea, leg swelling and PND.  Gastrointestinal: Positive for nausea. Negative for vomiting, abdominal pain, blood in stool and melena.  Genitourinary: Negative for dysuria and hematuria.  Neurological:  Positive for weakness and headaches. Negative for dizziness.  All other systems reviewed and are negative.   Blood pressure 170/72, pulse 73, temperature 98.1 F (36.7 C),  temperature source Oral, resp. rate 18, height 5\' 2"  (1.575 m), weight 218 lb (98.884 kg), SpO2 100.00%. Physical Exam  Constitutional: She is oriented to person, place, and time. She appears well-developed. No distress.  Obese  HENT:  Head: Normocephalic and atraumatic.  Mouth/Throat: Oropharyngeal exudate present.  Eyes: EOM are normal. Pupils are equal, round, and reactive to light. No scleral icterus.  Neck: Normal range of motion. Neck supple. No JVD present.  Cardiovascular: Normal rate, regular rhythm, S1 normal and S2 normal.   No murmur heard. Pulses:      Radial pulses are 2+ on the right side, and 2+ on the left side.       Dorsalis pedis pulses are 2+ on the right side, and 2+ on the left side.  No carotid bruits  Respiratory: Effort normal and breath sounds normal. No respiratory distress. She has no wheezes. She has no rales.  GI: Soft. Bowel sounds are normal. She exhibits no distension. There is no tenderness.  Musculoskeletal: She exhibits no edema.  Lymphadenopathy:    She has no cervical adenopathy.  Neurological: She is alert and oriented to person, place, and time. She exhibits normal muscle tone.  Skin: Skin is warm and dry.  Psychiatric: She has a normal mood and affect.    Assessment/Plan: Principal Problem:   Unstable angina Active Problems:   Type II or unspecified type diabetes mellitus without mention of complication, uncontrolled   Coronary atherosclerosis of native coronary artery  Plans:  Chest pain is concerning for unstable angina in a patient with risk factors and off all medications for the last 18 months.  No acute EKG changes noted and negative troponin.  Recommended left heart cath.  A1C 13.2.  Will need IM consult for DM management.  TSH: WNL. Replace K+.  ASA, Lopressor, lipitor.  Will add hydralazine for precath afterload reduction.  Consider adding ACE-I after cath.    HAGER, BRYAN 06/04/2013, 1:46 PM    I have seen and examined the  patient along with Wilburt Finlay, PA.  I have reviewed the chart, notes and new data.  I agree with PA's note.  Key new complaints: chest pain recurs just walking to the bathroom Key examination changes: no arrhythmia or signs of acute HF Key new findings / data: low risk ECG and cTnI  PLAN: Unstable angina with ongoing symptoms but without other markers of immediate risk. No recent GI bleeding. Cath Monday. Noncompliance with treatment of DM and hyperlipidemia is a serious issue and she is strongly encouraged to pay more attention to these issues - she lost her job and was not able to afford care. Will ask for SW consult to apply for indigent care.community clinic.  Thurmon Fair, MD, [facility] [phone] 06/04/2013, 6:24 PM

## 2013-06-04 NOTE — Progress Notes (Signed)
6/20  Spoke with patient about her diabetes.  Was diagnosed about 1 1/2 years ago.  Had been on insulin at one time.  Has not been taking it recently due to lack of insurance and loss of job.  Does not have a PCP.  Will need a PCP at discharge.  Is being transferred to [facility] main campus today for further workup after being admitted to [facility] for chest pain. Without insurance, patient may need to be on 7/30 mix or NPH insulin that can purchased at [facility].  Patient states that she has 5-6 different blood glucose meters, but would need to purchase strips.  Staff RNs can follow up with checking patient on insulin administration, order insulin starter kit, check patient on doing own CBGs, order Living Well with Diabetes booklet from pharmacy, and have patient watch DM videos #[id number] on hospital network.  Will ask for care management consult. Will continue to follow while in hospital.  Smith Mince RN BSN CDE

## 2013-06-04 NOTE — Progress Notes (Signed)
ANTICOAGULATION CONSULT NOTE - Follow Up Consult  Pharmacy Consult for IV Heparin Indication: r/o ACS/STEMI  No Known Allergies  Patient Measurements: Height: 5\' 2"  (157.5 cm) Weight: 218 lb (98.884 kg) IBW/kg (Calculated) : 50.1 Heparin Dosing Weight: 73 kg  Vital Signs: Temp: 97.9 F (36.6 C) (06/20 0645) Temp src: Oral (06/20 0645) BP: 152/87 mmHg (06/20 0645) Pulse Rate: 76 (06/20 0645)  Labs:  Recent Labs  06/03/13 1744 06/03/13 2300 06/04/13 0505 06/04/13 1148  HGB 15.8*  --  13.9  --   HCT 45.8  --  40.5  --   PLT 259  --  229  --   APTT  --  26  --   --   LABPROT  --  13.5  --   --   INR  --  1.04  --   --   HEPARINUNFRC  --   --  0.27* 0.38  CREATININE 0.55  --  0.55  --   TROPONINI  --  <0.30 <0.30  --     Estimated Creatinine Clearance: 88.3 ml/min (by C-G formula based on Cr of 0.55).   Assessment: 25 yof with h/o MI s/p stent 2006, DM, HTN presented 6/19 with c/o CP and chest pressure radiating to L arm. Initial EKG & troponin negative. IV heparin ordered until r/o ACS/STEMI. Cardiology consulted.   IV heparin currently running at 1050 units/hr.  Heparin level therapeutic at 0.38.  CBC wnl, no bleeding. Troponins neg x 2, no CP. ASA, statin, BB, Nitro, IV heparin.  Transferring to [facility] for possible cath.   Goal of Therapy:  Heparin level 0.3-0.7 units/ml Monitor platelets by anticoagulation protocol: Yes   Plan:   Continue IV heparin at 1050 units/hr  Confirmatory heparin level @1800  tonight  Daily heparin level and CBC   Pharmacy will f/u plans for cath and further anticoagulation  Geoffry Paradise, PharmD, BCPS Pager: [phone] 12:46 PM Pharmacy #: [phone]

## 2013-06-04 NOTE — Progress Notes (Signed)
ANTICOAGULATION CONSULT NOTE - Follow Up Consult  Pharmacy Consult for IV Heparin Indication: r/o ACS/STEMI  No Known Allergies  Labs:  Recent Labs  06/03/13 1744 06/03/13 2300 06/04/13 0505 06/04/13 1148 06/04/13 1945  HGB 15.8*  --  13.9  --   --   HCT 45.8  --  40.5  --   --   PLT 259  --  229  --   --   APTT  --  26  --   --   --   LABPROT  --  13.5  --   --   --   INR  --  1.04  --   --   --   HEPARINUNFRC  --   --  0.27* 0.38 <0.10*  CREATININE 0.55  --  0.55  --   --   TROPONINI  --  <0.30 <0.30  --   --     Estimated Creatinine Clearance: 88.3 ml/min (by C-G formula based on Cr of 0.55).   Assessment: 31 yof with h/o MI s/p stent 2006, DM, HTN presented 6/19 with c/o CP and chest pressure radiating to L arm. Initial EKG & troponin negative. IV heparin ordered until r/o ACS/STEMI. Cardiology consulted.   Heparin level now <0.10, RN reports problems with the IV site.  Goal of Therapy:  Heparin level 0.3-0.7 units/ml Monitor platelets by anticoagulation protocol: Yes   Plan:  1) Increase heparin to 1250 units / hr 2) Follow up AM heparin level, CBC  Thank you. Okey Regal, PharmD [phone]

## 2013-06-04 NOTE — Progress Notes (Signed)
*  PRELIMINARY RESULTS* Echocardiogram 2D Echocardiogram has been performed.  [name] 06/04/2013, 10:08 AM

## 2013-06-04 NOTE — Progress Notes (Signed)
ANTICOAGULATION CONSULT NOTE -Follow Up Consult  Pharmacy Consult for Heparin Indication: chest pain/ACS  No Known Allergies  Patient Measurements: Height: 5\' 2"  (157.5 cm) Weight: 218 lb (98.884 kg) IBW/kg (Calculated) : 50.1 Heparin Dosing Weight: 73 kg   Vital Signs: Temp: 97.8 F (36.6 C) (06/19 2048) Temp src: Oral (06/19 2048) BP: 158/92 mmHg (06/20 0010)  Labs:  Recent Labs  06/03/13 1744 06/03/13 2300 06/04/13 0505  HGB 15.8*  --  13.9  HCT 45.8  --  40.5  PLT 259  --  229  APTT  --  26  --   LABPROT  --  13.5  --   INR  --  1.04  --   HEPARINUNFRC  --   --  0.27*  CREATININE 0.55  --   --   TROPONINI  --  <0.30 <0.30    Estimated Creatinine Clearance: 88.3 ml/min (by C-G formula based on Cr of 0.55).   Medical History: Past Medical History  Diagnosis Date  . Diabetes mellitus without complication   . Heart attack   . GERD (gastroesophageal reflux disease)     Medications:  Scheduled:  . aspirin EC  81 mg Oral Daily  . atorvastatin  80 mg Oral q1800  . insulin aspart  0-9 Units Subcutaneous TID WC  . insulin aspart  3 Units Subcutaneous TID WC  . insulin glargine  10 Units Subcutaneous QHS  . metoprolol tartrate  12.5 mg Oral BID  . pneumococcal 23 valent vaccine  0.5 mL Intramuscular Tomorrow-1000  . sodium chloride  3 mL Intravenous Q12H  . sodium chloride  3 mL Intravenous Q12H   Infusions:  . sodium chloride 1,000 mL (06/03/13 2114)  . heparin 900 Units/hr (06/03/13 2257)    Assessment:  57 year old female with h/o CAD with stent.  Chief complaint of acute chest pain.  IV heparin to begin until cardiac enzymes are negative per H&P; cardiology to be consulted  First Heparin Level = 0.27 with heparin infusing @ 900 units/hr  No complications of therapy noted  Goal of Therapy:  Heparin level 0.3-0.7 units/ml Monitor platelets by anticoagulation protocol: Yes   Plan:   Increase heparin drip to 1050 units/hr  Recheck heparin  level 6 hrs after rate increased  Check daily heparin level and CBC while on heparin  Tal Neer, Joselyn Glassman, PharmD 06/04/2013,5:49 AM

## 2013-06-05 LAB — GLUCOSE, CAPILLARY
Glucose-Capillary: 272 mg/dL — ABNORMAL HIGH (ref 70–99)
Glucose-Capillary: 252 mg/dL — ABNORMAL HIGH (ref 70–99)

## 2013-06-05 LAB — CBC
MCV: 85.4 fL (ref 78.0–100.0)
Platelets: 216 10*3/uL (ref 150–400)
RDW: 12.9 % (ref 11.5–15.5)
WBC: 6 10*3/uL (ref 4.0–10.5)

## 2013-06-05 LAB — HEPARIN LEVEL (UNFRACTIONATED)
Heparin Unfractionated: 0.24 [IU]/mL — ABNORMAL LOW (ref 0.30–0.70)
Heparin Unfractionated: 0.15 IU/mL — ABNORMAL LOW (ref 0.30–0.70)

## 2013-06-05 MED ORDER — INSULIN GLARGINE 100 UNIT/ML ~~LOC~~ SOLN
30.0000 [IU] | Freq: Every day | SUBCUTANEOUS | Status: DC
  Administered 2013-06-05: 30 [IU] via SUBCUTANEOUS
  Filled 2013-06-05 (×2): qty 0.3

## 2013-06-05 MED ORDER — METOPROLOL TARTRATE 25 MG PO TABS
37.5000 mg | ORAL_TABLET | Freq: Two times a day (BID) | ORAL | Status: DC
  Administered 2013-06-05 – 2013-06-06 (×3): 37.5 mg via ORAL
  Filled 2013-06-05 (×5): qty 1

## 2013-06-05 MED ORDER — HYDRALAZINE HCL 50 MG PO TABS
50.0000 mg | ORAL_TABLET | Freq: Three times a day (TID) | ORAL | Status: DC
  Administered 2013-06-05 – 2013-06-06 (×3): 50 mg via ORAL
  Filled 2013-06-05 (×6): qty 1

## 2013-06-05 NOTE — Progress Notes (Signed)
ANTICOAGULATION CONSULT NOTE - Follow Up Consult  Pharmacy Consult for heparin Indication: chest pain/ACS  Labs:  Recent Labs  06/03/13 1744 06/03/13 2300  06/04/13 0505 06/04/13 1148 06/04/13 1945 06/05/13 0645  HGB 15.8*  --   --  13.9  --   --  13.7  HCT 45.8  --   --  40.5  --   --  40.2  PLT 259  --   --  229  --   --  216  APTT  --  26  --   --   --   --   --   LABPROT  --  13.5  --   --   --   --   --   INR  --  1.04  --   --   --   --   --   HEPARINUNFRC  --   --   < > 0.27* 0.38 <0.10* 0.24*  CREATININE 0.55  --   --  0.55  --   --   --   TROPONINI  --  <0.30  --  <0.30  --   --   --   < > = values in this interval not displayed.   Assessment: 57yo female remains subtherapeutic on heparin after rate increase though approaching goal.  Goal of Therapy:  Heparin level 0.3-0.7 units/ml   Plan:  Will increase heparin gtt by 2 units/kg(ABW)/hr to 1400 units/hr and check level in 6hr.  Vernard Gambles, PharmD, BCPS  06/05/2013,7:37 AM

## 2013-06-05 NOTE — Progress Notes (Signed)
ANTICOAGULATION CONSULT NOTE - Follow Up Consult  Pharmacy Consult for heparin Indication: chest pain/ACS  Labs:  Recent Labs  06/03/13 1744 06/03/13 2300 06/04/13 0505  06/05/13 0645 06/05/13 1435 06/05/13 2159  HGB 15.8*  --  13.9  --  13.7  --   --   HCT 45.8  --  40.5  --  40.2  --   --   PLT 259  --  229  --  216  --   --   APTT  --  26  --   --   --   --   --   LABPROT  --  13.5  --   --   --   --   --   INR  --  1.04  --   --   --   --   --   HEPARINUNFRC  --   --  0.27*  < > 0.24* 0.15* 0.24*  CREATININE 0.55  --  0.55  --   --   --   --   TROPONINI  --  <0.30 <0.30  --   --   --   --   < > = values in this interval not displayed.   Assessment: 57yo female remains subtherapeutic on heparin after rate resumed s/p pt adjusting rate on own.  Goal of Therapy:  Heparin level 0.3-0.7 units/ml   Plan:  Will increase heparin gtt by 2 units/kg/hr to 1600 units/hr and check level in am labs.  Vernard Gambles, PharmD, BCPS  06/05/2013,11:11 PM

## 2013-06-05 NOTE — Progress Notes (Signed)
[facility]  DAILY PROGRESS NOTE   Subjective:  No further angina, headache or nausea. No arrhythmia.  Objective:  Temp:  [97.8 F (36.6 C)-98.8 F (37.1 C)] 97.8 F (36.6 C) (06/21 0417) Pulse Rate:  [72-89] 78 (06/21 0648) Resp:  [17-19] 19 (06/21 0417) BP: (144-196)/(72-99) 156/91 mmHg (06/21 0648) SpO2:  [97 %-100 %] 98 % (06/21 0648) Weight change:   Intake/Output from previous day: 06/20 0701 - 06/21 0700 In: 1195.8 [P.O.:120; I.V.:1075.8] Out: 2275 [Urine:2275]  Intake/Output from this shift:    Medications: Current Facility-Administered Medications  Medication Dose Route Frequency Provider Last Rate Last Dose  . 0.9 %  sodium chloride infusion  1,000 mL Intravenous Continuous Lyanne Co, MD 125 mL/hr at 06/05/13 0431 1,000 mL at 06/05/13 0431  . 0.9 %  sodium chloride infusion  250 mL Intravenous PRN Lars Mage, MD      . acetaminophen (TYLENOL) tablet 650 mg  650 mg Oral Q4H PRN Wilburt Finlay, PA-C   650 mg at 06/04/13 1624  . aspirin EC tablet 81 mg  81 mg Oral Daily Lars Mage, MD   81 mg at 06/04/13 1004  . atorvastatin (LIPITOR) tablet 80 mg  80 mg Oral q1800 Lars Mage, MD   80 mg at 06/04/13 1821  . heparin ADULT infusion 100 units/mL (25000 units/250 mL)  1,400 Units/hr Intravenous Continuous Colleen Can, RPH 14 mL/hr at 06/05/13 0810 1,400 Units/hr at 06/05/13 0810  . hydrALAZINE (APRESOLINE) tablet 25 mg  25 mg Oral Q8H Bryan Hager, PA-C   25 mg at 06/05/13 0556  . insulin aspart (novoLOG) injection 0-9 Units  0-9 Units Subcutaneous TID WC Lars Mage, MD   3 Units at 06/05/13 0809  . insulin aspart (novoLOG) injection 3 Units  3 Units Subcutaneous TID WC Lars Mage, MD   3 Units at 06/05/13 0809  . insulin glargine (LANTUS) injection 20 Units  20 Units Subcutaneous QHS Nishant Dhungel, MD   20 Units at 06/04/13 2131  . metoprolol tartrate (LOPRESSOR) tablet 25 mg  25 mg Oral BID Wilburt Finlay, PA-C   25 mg at 06/04/13  2130  . morphine 2 MG/ML injection 2 mg  2 mg Intravenous Q4H PRN Lars Mage, MD   2 mg at 06/05/13 0556  . nitroGLYCERIN (NITROSTAT) SL tablet 0.4 mg  0.4 mg Sublingual Q5 min PRN Lars Mage, MD      . ondansetron (ZOFRAN) injection 4 mg  4 mg Intravenous Q6H PRN Wilburt Finlay, PA-C      . sodium chloride 0.9 % injection 3 mL  3 mL Intravenous Q12H Ankit Garg, MD      . sodium chloride 0.9 % injection 3 mL  3 mL Intravenous Q12H Ankit Garg, MD      . sodium chloride 0.9 % injection 3 mL  3 mL Intravenous PRN Lars Mage, MD        Physical Exam: General appearance: alert, cooperative and no distress Neck: no adenopathy, no carotid bruit, no JVD, supple, symmetrical, trachea midline and thyroid not enlarged, symmetric, no tenderness/mass/nodules Lungs: clear to auscultation bilaterally Heart: regular rate and rhythm, S1, S2 normal, no murmur, click, rub or gallop Abdomen: soft, non-tender; bowel sounds normal; no masses,  no organomegaly Extremities: extremities normal, atraumatic, no cyanosis or edema Pulses: 2+ and symmetric Skin: Skin color, texture, turgor normal. No rashes or lesions Neurologic: Grossly normal  Lab Results: Results for orders placed during the hospital encounter of 06/03/13 (from the  past 48 hour(s))  BASIC METABOLIC PANEL     Status: Abnormal   Collection Time    06/03/13  5:44 PM      Result Value Range   Sodium 129 (*) 135 - 145 mEq/L   Potassium 3.7  3.5 - 5.1 mEq/L   Chloride 93 (*) 96 - 112 mEq/L   CO2 22  19 - 32 mEq/L   Glucose, Bld 550 (*) 70 - 99 mg/dL   BUN 11  6 - 23 mg/dL   Creatinine, Ser 2.95  0.50 - 1.10 mg/dL   Calcium 9.7  8.4 - 62.1 mg/dL   GFR calc non Af Amer >90  >90 mL/min   GFR calc Af Amer >90  >90 mL/min   Comment:            The eGFR has been calculated     using the CKD EPI equation.     This calculation has not been     validated in all clinical     situations.     eGFR's persistently     <90 mL/min signify     possible  Chronic Kidney Disease.  PRO B NATRIURETIC PEPTIDE     Status: None   Collection Time    06/03/13  5:44 PM      Result Value Range   Pro B Natriuretic peptide (BNP) 86.4  0 - 125 pg/mL  CBC     Status: Abnormal   Collection Time    06/03/13  5:44 PM      Result Value Range   WBC 6.6  4.0 - 10.5 K/uL   RBC 5.32 (*) 3.87 - 5.11 MIL/uL   Hemoglobin 15.8 (*) 12.0 - 15.0 g/dL   HCT 30.8  65.7 - 84.6 %   MCV 86.1  78.0 - 100.0 fL   MCH 29.7  26.0 - 34.0 pg   MCHC 34.5  30.0 - 36.0 g/dL   RDW 96.2  95.2 - 84.1 %   Platelets 259  150 - 400 K/uL  POCT I-STAT TROPONIN I     Status: None   Collection Time    06/03/13  5:59 PM      Result Value Range   Troponin i, poc 0.01  0.00 - 0.08 ng/mL   Comment 3            Comment: Due to the release kinetics of cTnI,     a negative result within the first hours     of the onset of symptoms does not rule out     myocardial infarction with certainty.     If myocardial infarction is still suspected,     repeat the test at appropriate intervals.  TROPONIN I     Status: None   Collection Time    06/03/13 11:00 PM      Result Value Range   Troponin I <0.30  <0.30 ng/mL   Comment:            Due to the release kinetics of cTnI,     a negative result within the first hours     of the onset of symptoms does not rule out     myocardial infarction with certainty.     If myocardial infarction is still suspected,     repeat the test at appropriate intervals.  TSH     Status: None   Collection Time    06/03/13 11:00 PM      Result Value Range  TSH 1.264  0.350 - 4.500 uIU/mL  HEMOGLOBIN A1C     Status: Abnormal   Collection Time    06/03/13 11:00 PM      Result Value Range   Hemoglobin A1C 13.2 (*) <5.7 %   Comment: (NOTE)                                                                               According to the ADA Clinical Practice Recommendations for 2011, when     HbA1c is used as a screening test:      >=6.5%   Diagnostic of Diabetes  Mellitus               (if abnormal result is confirmed)     5.7-6.4%   Increased risk of developing Diabetes Mellitus     References:Diagnosis and Classification of Diabetes Mellitus,Diabetes     Care,2011,34(Suppl 1):S62-S69 and Standards of Medical Care in             Diabetes - 2011,Diabetes Care,2011,34 (Suppl 1):S11-S61.   Mean Plasma Glucose 332 (*) <117 mg/dL  LIPID PANEL     Status: Abnormal   Collection Time    06/03/13 11:00 PM      Result Value Range   Cholesterol 199  0 - 200 mg/dL   Triglycerides 045 (*) <150 mg/dL   HDL 45  >40 mg/dL   Total CHOL/HDL Ratio 4.4     VLDL 31  0 - 40 mg/dL   LDL Cholesterol 981 (*) 0 - 99 mg/dL   Comment:            Total Cholesterol/HDL:CHD Risk     Coronary Heart Disease Risk Table                         Men   Women      1/2 Average Risk   3.4   3.3      Average Risk       5.0   4.4      2 X Average Risk   9.6   7.1      3 X Average Risk  23.4   11.0                Use the calculated Patient Ratio     above and the CHD Risk Table     to determine the patient's CHD Risk.                ATP III CLASSIFICATION (LDL):      <100     mg/dL   Optimal      191-478  mg/dL   Near or Above                        Optimal      130-159  mg/dL   Borderline      295-621  mg/dL   High      >308     mg/dL   Very High  APTT     Status: None   Collection Time    06/03/13 11:00 PM  Result Value Range   aPTT 26  24 - 37 seconds  PROTIME-INR     Status: None   Collection Time    06/03/13 11:00 PM      Result Value Range   Prothrombin Time 13.5  11.6 - 15.2 seconds   INR 1.04  0.00 - 1.49  BASIC METABOLIC PANEL     Status: Abnormal   Collection Time    06/04/13  5:05 AM      Result Value Range   Sodium 136  135 - 145 mEq/L   Comment: DELTA CHECK NOTED   Potassium 3.3 (*) 3.5 - 5.1 mEq/L   Chloride 101  96 - 112 mEq/L   Comment: DELTA CHECK NOTED   CO2 28  19 - 32 mEq/L   Glucose, Bld 315 (*) 70 - 99 mg/dL   BUN 9  6 - 23 mg/dL    Creatinine, Ser 1.61  0.50 - 1.10 mg/dL   Calcium 8.9  8.4 - 09.6 mg/dL   GFR calc non Af Amer >90  >90 mL/min   GFR calc Af Amer >90  >90 mL/min   Comment:            The eGFR has been calculated     using the CKD EPI equation.     This calculation has not been     validated in all clinical     situations.     eGFR's persistently     <90 mL/min signify     possible Chronic Kidney Disease.  CBC     Status: None   Collection Time    06/04/13  5:05 AM      Result Value Range   WBC 6.1  4.0 - 10.5 K/uL   RBC 4.77  3.87 - 5.11 MIL/uL   Hemoglobin 13.9  12.0 - 15.0 g/dL   HCT 04.5  40.9 - 81.1 %   MCV 84.9  78.0 - 100.0 fL   MCH 29.1  26.0 - 34.0 pg   MCHC 34.3  30.0 - 36.0 g/dL   RDW 91.4  78.2 - 95.6 %   Platelets 229  150 - 400 K/uL  TROPONIN I     Status: None   Collection Time    06/04/13  5:05 AM      Result Value Range   Troponin I <0.30  <0.30 ng/mL   Comment:            Due to the release kinetics of cTnI,     a negative result within the first hours     of the onset of symptoms does not rule out     myocardial infarction with certainty.     If myocardial infarction is still suspected,     repeat the test at appropriate intervals.  HEPARIN LEVEL (UNFRACTIONATED)     Status: Abnormal   Collection Time    06/04/13  5:05 AM      Result Value Range   Heparin Unfractionated 0.27 (*) 0.30 - 0.70 IU/mL   Comment:            IF HEPARIN RESULTS ARE BELOW     EXPECTED VALUES, AND PATIENT     DOSAGE HAS BEEN CONFIRMED,     SUGGEST FOLLOW UP TESTING     OF ANTITHROMBIN III LEVELS.  GLUCOSE, CAPILLARY     Status: Abnormal   Collection Time    06/04/13  7:38 AM      Result Value Range  Glucose-Capillary 279 (*) 70 - 99 mg/dL  HEPARIN LEVEL (UNFRACTIONATED)     Status: None   Collection Time    06/04/13 11:48 AM      Result Value Range   Heparin Unfractionated 0.38  0.30 - 0.70 IU/mL   Comment:            IF HEPARIN RESULTS ARE BELOW     EXPECTED VALUES, AND PATIENT      DOSAGE HAS BEEN CONFIRMED,     SUGGEST FOLLOW UP TESTING     OF ANTITHROMBIN III LEVELS.  GLUCOSE, CAPILLARY     Status: Abnormal   Collection Time    06/04/13 12:44 PM      Result Value Range   Glucose-Capillary 267 (*) 70 - 99 mg/dL  GLUCOSE, CAPILLARY     Status: Abnormal   Collection Time    06/04/13  4:49 PM      Result Value Range   Glucose-Capillary 293 (*) 70 - 99 mg/dL  HEPARIN LEVEL (UNFRACTIONATED)     Status: Abnormal   Collection Time    06/04/13  7:45 PM      Result Value Range   Heparin Unfractionated <0.10 (*) 0.30 - 0.70 IU/mL   Comment:            IF HEPARIN RESULTS ARE BELOW     EXPECTED VALUES, AND PATIENT     DOSAGE HAS BEEN CONFIRMED,     SUGGEST FOLLOW UP TESTING     OF ANTITHROMBIN III LEVELS.  GLUCOSE, CAPILLARY     Status: Abnormal   Collection Time    06/04/13  8:56 PM      Result Value Range   Glucose-Capillary 300 (*) 70 - 99 mg/dL  GLUCOSE, CAPILLARY     Status: Abnormal   Collection Time    06/05/13  5:50 AM      Result Value Range   Glucose-Capillary 230 (*) 70 - 99 mg/dL   Comment 1 Documented in Chart     Comment 2 Notify RN    HEPARIN LEVEL (UNFRACTIONATED)     Status: Abnormal   Collection Time    06/05/13  6:45 AM      Result Value Range   Heparin Unfractionated 0.24 (*) 0.30 - 0.70 IU/mL   Comment:            IF HEPARIN RESULTS ARE BELOW     EXPECTED VALUES, AND PATIENT     DOSAGE HAS BEEN CONFIRMED,     SUGGEST FOLLOW UP TESTING     OF ANTITHROMBIN III LEVELS.  CBC     Status: None   Collection Time    06/05/13  6:45 AM      Result Value Range   WBC 6.0  4.0 - 10.5 K/uL   RBC 4.71  3.87 - 5.11 MIL/uL   Hemoglobin 13.7  12.0 - 15.0 g/dL   HCT 10.9  60.4 - 54.0 %   MCV 85.4  78.0 - 100.0 fL   MCH 29.1  26.0 - 34.0 pg   MCHC 34.1  30.0 - 36.0 g/dL   RDW 98.1  19.1 - 47.8 %   Platelets 216  150 - 400 K/uL  GLUCOSE, CAPILLARY     Status: Abnormal   Collection Time    06/05/13  7:57 AM      Result Value Range    Glucose-Capillary 228 (*) 70 - 99 mg/dL   Comment 1 Notify RN      Imaging: Dg  Chest Port 1 View  06/03/2013   *RADIOLOGY REPORT*  Clinical Data: Chest pain and short of breath  PORTABLE CHEST - 1 VIEW  Comparison: 01/27/2010  Findings: Mild elevation right hemidiaphragm, unchanged.  Lungs are clear.  Negative for heart failure or pneumonia.  No pleural effusion or mass.  IMPRESSION: No acute abnormality.  No interval change.   Original Report Authenticated By: Janeece Riggers, M.D.    Assessment:  1. Principal Problem: 2.   Unstable angina 3. Active Problems: 4.   Type II or unspecified type diabetes mellitus without mention of complication, uncontrolled 5.   Coronary atherosclerosis of native coronary artery 6.   HTN (hypertension) 7.   Plan:  1. Echo pending 2. BP improved, still high 3. Plan cath Monday AM barring new developments 4. Recheck BMET 5. Add ARB/ACEi after cath  Time Spent Directly with Patient:  30 minutes  Length of Stay:  LOS: 2 days    [name] 06/05/2013, 9:23 AM

## 2013-06-05 NOTE — Progress Notes (Signed)
ANTICOAGULATION CONSULT NOTE - Follow Up Consult  Pharmacy Consult for heparin Indication: chest pain/ACS  Labs:  Recent Labs  06/03/13 1744 06/03/13 2300 06/04/13 0505  06/04/13 1945 06/05/13 0645 06/05/13 1435  HGB 15.8*  --  13.9  --   --  13.7  --   HCT 45.8  --  40.5  --   --  40.2  --   PLT 259  --  229  --   --  216  --   APTT  --  26  --   --   --   --   --   LABPROT  --  13.5  --   --   --   --   --   INR  --  1.04  --   --   --   --   --   HEPARINUNFRC  --   --  0.27*  < > <0.10* 0.24* 0.15*  CREATININE 0.55  --  0.55  --   --   --   --   TROPONINI  --  <0.30 <0.30  --   --   --   --   < > = values in this interval not displayed.   Assessment: 57yo female presents with new CP with Hx CAD Cx stent 2006 and uncontrolled DM.   Heparin drip supposed to running at 1400 uts/hr ( RN increaed drip rate this AM)  but RN states pt playing with the IV pump current rate 1250 uts/hr.  HL 0.15.  CBC stable, no bleeding noted.  RN will increase heparin drip and lock pump.  Goal of Therapy:  Heparin level 0.3-0.7 units/ml   Plan:  Increase heparin drip 1450 uts/hr Check HL in 6hr  Leota Sauers Pharm.D. CPP, BCPS Clinical Pharmacist [phone] 06/05/2013 3:32 PM

## 2013-06-06 LAB — CBC
Hemoglobin: 13.7 g/dL (ref 12.0–15.0)
MCH: 29 pg (ref 26.0–34.0)
MCHC: 33.5 g/dL (ref 30.0–36.0)

## 2013-06-06 LAB — GLUCOSE, CAPILLARY
Glucose-Capillary: 236 mg/dL — ABNORMAL HIGH (ref 70–99)
Glucose-Capillary: 252 mg/dL — ABNORMAL HIGH (ref 70–99)
Glucose-Capillary: 186 mg/dL — ABNORMAL HIGH (ref 70–99)

## 2013-06-06 MED ORDER — SODIUM CHLORIDE 0.9 % IJ SOLN: 3.0000 mL | INTRAMUSCULAR | Status: DC | PRN

## 2013-06-06 MED ORDER — SODIUM CHLORIDE 0.9 % IV SOLN: 250.0000 mL | INTRAVENOUS | Status: DC | PRN

## 2013-06-06 MED ORDER — ALPRAZOLAM 0.5 MG PO TABS
0.5000 mg | ORAL_TABLET | Freq: Three times a day (TID) | ORAL | Status: DC | PRN
  Administered 2013-06-06 (×2): 0.5 mg via ORAL
  Filled 2013-06-06 (×2): qty 1

## 2013-06-06 MED ORDER — SODIUM CHLORIDE 0.9 % IJ SOLN: 3.0000 mL | Freq: Two times a day (BID) | INTRAMUSCULAR | Status: DC

## 2013-06-06 MED ORDER — METOPROLOL TARTRATE 50 MG PO TABS
50.0000 mg | ORAL_TABLET | Freq: Two times a day (BID) | ORAL | Status: DC
  Administered 2013-06-06 – 2013-06-09 (×5): 50 mg via ORAL
  Filled 2013-06-06 (×8): qty 1

## 2013-06-06 MED ORDER — HYDRALAZINE HCL 25 MG PO TABS
75.0000 mg | ORAL_TABLET | Freq: Three times a day (TID) | ORAL | Status: DC
  Administered 2013-06-06 – 2013-06-08 (×5): 75 mg via ORAL
  Filled 2013-06-06 (×11): qty 1

## 2013-06-06 MED ORDER — INSULIN GLARGINE 100 UNIT/ML ~~LOC~~ SOLN
36.0000 [IU] | Freq: Every day | SUBCUTANEOUS | Status: DC
  Administered 2013-06-06 – 2013-06-07 (×2): 36 [IU] via SUBCUTANEOUS
  Filled 2013-06-06 (×3): qty 0.36

## 2013-06-06 NOTE — Progress Notes (Signed)
[facility]  DAILY PROGRESS NOTE   Subjective:  Headache and chest pain are both improved. She is very anxious when awake, but sleeps soundly. BP still high as is blood glucose.  Objective:  Temp:  [98.3 F (36.8 C)-99.4 F (37.4 C)] 98.8 F (37.1 C) (06/22 0418) Pulse Rate:  [77-89] 77 (06/22 0418) Resp:  [18-20] 20 (06/22 0418) BP: (155-176)/(85-97) 164/91 mmHg (06/22 0418) SpO2:  [95 %-100 %] 95 % (06/22 0418) Weight change:   Intake/Output from previous day: 06/21 0701 - 06/22 0700 In: 360 [P.O.:360] Out: 1500 [Urine:1500]  Intake/Output from this shift:    Medications: Current Facility-Administered Medications  Medication Dose Route Frequency Provider Last Rate Last Dose  . 0.9 %  sodium chloride infusion  1,000 mL Intravenous Continuous Lyanne Co, MD 125 mL/hr at 06/05/13 1239 1,000 mL at 06/05/13 1239  . 0.9 %  sodium chloride infusion  250 mL Intravenous PRN Lars Mage, MD      . acetaminophen (TYLENOL) tablet 650 mg  650 mg Oral Q4H PRN Wilburt Finlay, PA-C   650 mg at 06/06/13 0454  . ALPRAZolam (XANAX) tablet 0.5 mg  0.5 mg Oral TID PRN Thurmon Fair, MD      . aspirin EC tablet 81 mg  81 mg Oral Daily Lars Mage, MD   81 mg at 06/05/13 1024  . atorvastatin (LIPITOR) tablet 80 mg  80 mg Oral q1800 Lars Mage, MD   80 mg at 06/05/13 1657  . heparin ADULT infusion 100 units/mL (25000 units/250 mL)  1,600 Units/hr Intravenous Continuous Colleen Can, RPH 16 mL/hr at 06/06/13 0808 1,600 Units/hr at 06/06/13 0808  . hydrALAZINE (APRESOLINE) tablet 75 mg  75 mg Oral Q8H Kacee Sukhu, MD      . insulin aspart (novoLOG) injection 0-9 Units  0-9 Units Subcutaneous TID WC Lars Mage, MD   3 Units at 06/06/13 0641  . insulin aspart (novoLOG) injection 3 Units  3 Units Subcutaneous TID WC Lars Mage, MD   3 Units at 06/06/13 0981  . insulin glargine (LANTUS) injection 36 Units  36 Units Subcutaneous QHS Varian Innes, MD      .  metoprolol (LOPRESSOR) tablet 50 mg  50 mg Oral BID Rikki Smestad, MD      . morphine 2 MG/ML injection 2 mg  2 mg Intravenous Q4H PRN Lars Mage, MD   2 mg at 06/06/13 0655  . nitroGLYCERIN (NITROSTAT) SL tablet 0.4 mg  0.4 mg Sublingual Q5 min PRN Lars Mage, MD      . ondansetron (ZOFRAN) injection 4 mg  4 mg Intravenous Q6H PRN Wilburt Finlay, PA-C   4 mg at 06/06/13 0129  . sodium chloride 0.9 % injection 3 mL  3 mL Intravenous Q12H Ankit Garg, MD      . sodium chloride 0.9 % injection 3 mL  3 mL Intravenous Q12H Ankit Garg, MD      . sodium chloride 0.9 % injection 3 mL  3 mL Intravenous PRN Lars Mage, MD        Physical Exam: General appearance: alert, cooperative and anxious Neck: no adenopathy, no carotid bruit, no JVD, supple, symmetrical, trachea midline and thyroid not enlarged, symmetric, no tenderness/mass/nodules Lungs: clear to auscultation bilaterally Heart: regular rate and rhythm, S1, S2 normal, no murmur, click, rub or gallop Abdomen: soft, non-tender; bowel sounds normal; no masses,  no organomegaly Extremities: extremities normal, atraumatic, no cyanosis or edema Pulses: 2+ and symmetric Skin: Skin  color, texture, turgor normal. No rashes or lesions Neurologic: Grossly normal  Lab Results: Results for orders placed during the hospital encounter of 06/03/13 (from the past 48 hour(s))  HEPARIN LEVEL (UNFRACTIONATED)     Status: None   Collection Time    06/04/13 11:48 AM      Result Value Range   Heparin Unfractionated 0.38  0.30 - 0.70 IU/mL   Comment:            IF HEPARIN RESULTS ARE BELOW     EXPECTED VALUES, AND PATIENT     DOSAGE HAS BEEN CONFIRMED,     SUGGEST FOLLOW UP TESTING     OF ANTITHROMBIN III LEVELS.  GLUCOSE, CAPILLARY     Status: Abnormal   Collection Time    06/04/13 12:44 PM      Result Value Range   Glucose-Capillary 267 (*) 70 - 99 mg/dL  GLUCOSE, CAPILLARY     Status: Abnormal   Collection Time    06/04/13  4:49 PM      Result Value  Range   Glucose-Capillary 293 (*) 70 - 99 mg/dL  HEPARIN LEVEL (UNFRACTIONATED)     Status: Abnormal   Collection Time    06/04/13  7:45 PM      Result Value Range   Heparin Unfractionated <0.10 (*) 0.30 - 0.70 IU/mL   Comment:            IF HEPARIN RESULTS ARE BELOW     EXPECTED VALUES, AND PATIENT     DOSAGE HAS BEEN CONFIRMED,     SUGGEST FOLLOW UP TESTING     OF ANTITHROMBIN III LEVELS.  GLUCOSE, CAPILLARY     Status: Abnormal   Collection Time    06/04/13  8:56 PM      Result Value Range   Glucose-Capillary 300 (*) 70 - 99 mg/dL  GLUCOSE, CAPILLARY     Status: Abnormal   Collection Time    06/05/13  5:50 AM      Result Value Range   Glucose-Capillary 230 (*) 70 - 99 mg/dL   Comment 1 Documented in Chart     Comment 2 Notify RN    HEPARIN LEVEL (UNFRACTIONATED)     Status: Abnormal   Collection Time    06/05/13  6:45 AM      Result Value Range   Heparin Unfractionated 0.24 (*) 0.30 - 0.70 IU/mL   Comment:            IF HEPARIN RESULTS ARE BELOW     EXPECTED VALUES, AND PATIENT     DOSAGE HAS BEEN CONFIRMED,     SUGGEST FOLLOW UP TESTING     OF ANTITHROMBIN III LEVELS.  CBC     Status: None   Collection Time    06/05/13  6:45 AM      Result Value Range   WBC 6.0  4.0 - 10.5 K/uL   RBC 4.71  3.87 - 5.11 MIL/uL   Hemoglobin 13.7  12.0 - 15.0 g/dL   HCT 40.9  81.1 - 91.4 %   MCV 85.4  78.0 - 100.0 fL   MCH 29.1  26.0 - 34.0 pg   MCHC 34.1  30.0 - 36.0 g/dL   RDW 78.2  95.6 - 21.3 %   Platelets 216  150 - 400 K/uL  GLUCOSE, CAPILLARY     Status: Abnormal   Collection Time    06/05/13  7:57 AM      Result Value Range  Glucose-Capillary 228 (*) 70 - 99 mg/dL   Comment 1 Notify RN    GLUCOSE, CAPILLARY     Status: Abnormal   Collection Time    06/05/13 11:06 AM      Result Value Range   Glucose-Capillary 272 (*) 70 - 99 mg/dL   Comment 1 Notify RN    HEPARIN LEVEL (UNFRACTIONATED)     Status: Abnormal   Collection Time    06/05/13  2:35 PM      Result  Value Range   Heparin Unfractionated 0.15 (*) 0.30 - 0.70 IU/mL   Comment:            IF HEPARIN RESULTS ARE BELOW     EXPECTED VALUES, AND PATIENT     DOSAGE HAS BEEN CONFIRMED,     SUGGEST FOLLOW UP TESTING     OF ANTITHROMBIN III LEVELS.  GLUCOSE, CAPILLARY     Status: Abnormal   Collection Time    06/05/13  4:13 PM      Result Value Range   Glucose-Capillary 272 (*) 70 - 99 mg/dL   Comment 1 Notify RN    GLUCOSE, CAPILLARY     Status: Abnormal   Collection Time    06/05/13  8:51 PM      Result Value Range   Glucose-Capillary 252 (*) 70 - 99 mg/dL   Comment 1 Documented in Chart     Comment 2 Notify RN    HEPARIN LEVEL (UNFRACTIONATED)     Status: Abnormal   Collection Time    06/05/13  9:59 PM      Result Value Range   Heparin Unfractionated 0.24 (*) 0.30 - 0.70 IU/mL   Comment:            IF HEPARIN RESULTS ARE BELOW     EXPECTED VALUES, AND PATIENT     DOSAGE HAS BEEN CONFIRMED,     SUGGEST FOLLOW UP TESTING     OF ANTITHROMBIN III LEVELS.  HEPARIN LEVEL (UNFRACTIONATED)     Status: None   Collection Time    06/06/13  4:25 AM      Result Value Range   Heparin Unfractionated 0.30  0.30 - 0.70 IU/mL   Comment:            IF HEPARIN RESULTS ARE BELOW     EXPECTED VALUES, AND PATIENT     DOSAGE HAS BEEN CONFIRMED,     SUGGEST FOLLOW UP TESTING     OF ANTITHROMBIN III LEVELS.  CBC     Status: None   Collection Time    06/06/13  4:25 AM      Result Value Range   WBC 7.1  4.0 - 10.5 K/uL   RBC 4.73  3.87 - 5.11 MIL/uL   Hemoglobin 13.7  12.0 - 15.0 g/dL   HCT 29.5  62.1 - 30.8 %   MCV 86.5  78.0 - 100.0 fL   MCH 29.0  26.0 - 34.0 pg   MCHC 33.5  30.0 - 36.0 g/dL   RDW 65.7  84.6 - 96.2 %   Platelets 221  150 - 400 K/uL  GLUCOSE, CAPILLARY     Status: Abnormal   Collection Time    06/06/13  5:41 AM      Result Value Range   Glucose-Capillary 225 (*) 70 - 99 mg/dL   Comment 1 Documented in Chart     Comment 2 Notify RN      Imaging:  No results  found.  Assessment:  1. Principal Problem: 2.   Unstable angina 3. Active Problems: 4.   Type II or unspecified type diabetes mellitus without mention of complication, uncontrolled 5.   Coronary atherosclerosis of native coronary artery 6.   HTN (hypertension) 7.   Plan:  1. Coronary angio +/- PCI in AM 2. Increase hydralazine 3. Increase Lantus dose 4. Add ACEi/ARB post cath 5. May also need low dose thiazide for optimal management of BP.  Time Spent Directly with Patient:  30  minutes  Length of Stay:  LOS: 3 days    Guiseppe Flanagan 06/06/2013, 8:56 AM

## 2013-06-06 NOTE — Progress Notes (Signed)
ANTICOAGULATION CONSULT NOTE - Follow Up Consult  Pharmacy Consult for heparin Indication: chest pain/ACS  Labs:  Recent Labs  06/03/13 1744 06/03/13 2300 06/04/13 0505  06/05/13 0645 06/05/13 1435 06/05/13 2159 06/06/13 0425  HGB 15.8*  --  13.9  --  13.7  --   --  13.7  HCT 45.8  --  40.5  --  40.2  --   --  40.9  PLT 259  --  229  --  216  --   --  221  APTT  --  26  --   --   --   --   --   --   LABPROT  --  13.5  --   --   --   --   --   --   INR  --  1.04  --   --   --   --   --   --   HEPARINUNFRC  --   --  0.27*  < > 0.24* 0.15* 0.24* 0.30  CREATININE 0.55  --  0.55  --   --   --   --   --   TROPONINI  --  <0.30 <0.30  --   --   --   --   --   < > = values in this interval not displayed.   Assessment: 57yo female presents with new CP with Hx CAD Cx stent 2006 and uncontrolled DM, HTN meds adjusted plan cath lab 6/23.   Heparin drip 1600 uts/hr HL 0.3 at goal.  CBC stable, no bleeding noted.    Goal of Therapy:  Heparin level 0.3-0.7 units/ml   Plan:  Continue heparin drip 1600 uts/hr Daily CBC, HL  Leota Sauers Pharm.D. CPP, BCPS Clinical Pharmacist [phone] 06/06/2013 1:17 PM

## 2013-06-07 ENCOUNTER — Encounter (HOSPITAL_COMMUNITY): Payer: Self-pay | Admitting: Cardiology

## 2013-06-07 ENCOUNTER — Encounter (HOSPITAL_COMMUNITY)
Admission: EM | Disposition: A | Discharge status: Home / Self Care | Payer: Self-pay | Source: Home / Self Care | Attending: Cardiovascular Disease

## 2013-06-07 DIAGNOSIS — E785 Hyperlipidemia, unspecified: Secondary | ICD-10-CM

## 2013-06-07 DIAGNOSIS — Z955 Presence of coronary angioplasty implant and graft: Secondary | ICD-10-CM

## 2013-06-07 HISTORY — DX: Hyperlipidemia, unspecified: E78.5

## 2013-06-07 HISTORY — DX: Presence of coronary angioplasty implant and graft: Z95.5

## 2013-06-07 HISTORY — PX: LEFT HEART CATHETERIZATION WITH CORONARY ANGIOGRAM: SHX5451

## 2013-06-07 LAB — BASIC METABOLIC PANEL
BUN: 7 mg/dL (ref 6–23)
Chloride: 105 mEq/L (ref 96–112)
GFR calc Af Amer: >90 mL/min (ref >90)
Potassium: 3.5 mEq/L (ref 3.5–5.1)

## 2013-06-07 LAB — PROTIME-INR: Prothrombin Time: 12.6 seconds (ref 11.6–15.2)

## 2013-06-07 LAB — GLUCOSE, CAPILLARY
Glucose-Capillary: 207 mg/dL — ABNORMAL HIGH (ref 70–99)
Glucose-Capillary: 279 mg/dL — ABNORMAL HIGH (ref 70–99)

## 2013-06-07 LAB — CBC
Hemoglobin: 14.2 g/dL (ref 12.0–15.0)
MCH: 28.9 pg (ref 26.0–34.0)
MCHC: 33.3 g/dL (ref 30.0–36.0)

## 2013-06-07 LAB — POCT ACTIVATED CLOTTING TIME: Activated Clotting Time: 534 seconds

## 2013-06-07 SURGERY — LEFT HEART CATHETERIZATION WITH CORONARY ANGIOGRAM
Anesthesia: LOCAL

## 2013-06-07 MED ORDER — HEPARIN SODIUM (PORCINE) 1000 UNIT/ML IJ SOLN
INTRAMUSCULAR | Status: AC
  Filled 2013-06-07: qty 1

## 2013-06-07 MED ORDER — LIDOCAINE HCL (PF) 1 % IJ SOLN
INTRAMUSCULAR | Status: AC
  Filled 2013-06-07: qty 30

## 2013-06-07 MED ORDER — PRASUGREL HCL 10 MG PO TABS
ORAL_TABLET | ORAL | Status: AC
  Filled 2013-06-07: qty 6

## 2013-06-07 MED ORDER — MIDAZOLAM HCL 2 MG/2ML IJ SOLN
INTRAMUSCULAR | Status: AC
  Filled 2013-06-07: qty 2

## 2013-06-07 MED ORDER — SODIUM CHLORIDE 0.9 % IJ SOLN
3.0000 mL | Freq: Two times a day (BID) | INTRAMUSCULAR | Status: DC
  Administered 2013-06-07 – 2013-06-09 (×3): 3 mL via INTRAVENOUS

## 2013-06-07 MED ORDER — NITROGLYCERIN 0.2 MG/ML ON CALL CATH LAB
INTRAVENOUS | Status: AC
  Filled 2013-06-07: qty 1

## 2013-06-07 MED ORDER — FENTANYL CITRATE 0.05 MG/ML IJ SOLN
INTRAMUSCULAR | Status: AC
  Filled 2013-06-07: qty 2

## 2013-06-07 MED ORDER — SODIUM CHLORIDE 0.9 % IV SOLN: 0.0500 mg/kg/h | INTRAVENOUS | Status: DC

## 2013-06-07 MED ORDER — HEPARIN (PORCINE) IN NACL 2-0.9 UNIT/ML-% IJ SOLN
INTRAMUSCULAR | Status: AC
  Filled 2013-06-07: qty 1000

## 2013-06-07 MED ORDER — ASPIRIN 81 MG PO CHEW
324.0000 mg | CHEWABLE_TABLET | ORAL | Status: AC
Start: 2013-06-08 — End: 2013-06-07
  Administered 2013-06-07: 324 mg via ORAL
  Filled 2013-06-07: qty 4

## 2013-06-07 MED ORDER — PRASUGREL HCL 10 MG PO TABS
10.0000 mg | ORAL_TABLET | Freq: Every day | ORAL | Status: DC
  Administered 2013-06-08 – 2013-06-09 (×2): 10 mg via ORAL
  Filled 2013-06-07 (×2): qty 1

## 2013-06-07 MED ORDER — SODIUM CHLORIDE 0.9 % IJ SOLN
3.0000 mL | INTRAMUSCULAR | Status: DC | PRN
  Administered 2013-06-09: 3 mL via INTRAVENOUS

## 2013-06-07 MED ORDER — SODIUM CHLORIDE 0.9 % IV SOLN
0.2500 mg/kg/h | INTRAVENOUS | Status: AC
  Filled 2013-06-07: qty 250

## 2013-06-07 MED ORDER — BIVALIRUDIN 250 MG IV SOLR
INTRAVENOUS | Status: AC
  Filled 2013-06-07: qty 250

## 2013-06-07 MED ORDER — VERAPAMIL HCL 2.5 MG/ML IV SOLN
INTRAVENOUS | Status: AC
  Filled 2013-06-07: qty 2

## 2013-06-07 MED ORDER — LABETALOL HCL 5 MG/ML IV SOLN
INTRAVENOUS | Status: AC
  Filled 2013-06-07: qty 4

## 2013-06-07 MED ORDER — SODIUM CHLORIDE 0.9 % IV SOLN: 250.0000 mL | INTRAVENOUS | Status: DC | PRN

## 2013-06-07 MED ORDER — SODIUM CHLORIDE 0.9 % IV SOLN
1.0000 mL/kg/h | INTRAVENOUS | Status: AC
  Administered 2013-06-07: 13:00:00 1 mL/kg/h via INTRAVENOUS

## 2013-06-07 NOTE — Progress Notes (Signed)
      Subjective: I only have chest pain when I walk.  Objective: Vital signs in last 24 hours: Temp:  [98 F (36.7 C)-98.7 F (37.1 C)] 98.7 F (37.1 C) (06/23 0407) Pulse Rate:  [80-88] 88 (06/23 0407) Resp:  [19-21] 20 (06/23 0407) BP: (131-175)/(84-108) 131/84 mmHg (06/23 0652) SpO2:  [96 %] 96 % (06/23 0407) Weight:  [226 lb 3.1 oz (102.6 kg)] 226 lb 3.1 oz (102.6 kg) (06/23 0225) Weight change:  Last BM Date: 06/06/13 Intake/Output from previous day: -1160 06/22 0701 - 06/23 0700 In: 120 [P.O.:120] Out: 1400 [Urine:1400] Intake/Output this shift:    PE: General:Pleasant affect, NAD Skin:Warm and dry, brisk capillary refill Heart:S1S2 RRR without murmur, gallup, rub or click Lungs:clear without rales, rhonchi, or wheezes HYQ:MVHQ, non tender, + BS, do not palpate liver spleen or masses Ext:no lower ext edema, 2+ pedal pulses, 2+ radial pulses Neuro:alert and oriented, MAE, follows commands, + facial symmetry   Lab Results:  Recent Labs  06/06/13 0425 06/07/13 0437  WBC 7.1 7.1  HGB 13.7 14.2  HCT 40.9 42.7  PLT 221 228     Lab Results  Component Value Date   CHOL 199 06/03/2013   HDL 45 06/03/2013   LDLCALC 123* 06/03/2013   TRIG 155* 06/03/2013   CHOLHDL 4.4 06/03/2013   Lab Results  Component Value Date   HGBA1C 13.2* 06/03/2013     Lab Results  Component Value Date   TSH 1.264 06/03/2013      Studies/Results: No results found.  Medications: I have reviewed the patient's current medications. Scheduled Meds: . aspirin EC  81 mg Oral Daily  . atorvastatin  80 mg Oral q1800  . hydrALAZINE  75 mg Oral Q8H  . insulin aspart  0-9 Units Subcutaneous TID WC  . insulin aspart  3 Units Subcutaneous TID WC  . insulin glargine  36 Units Subcutaneous QHS  . metoprolol tartrate  50 mg Oral BID  . sodium chloride  3 mL Intravenous Q12H  . sodium chloride  3 mL Intravenous Q12H  . sodium chloride  3 mL Intravenous Q12H   Continuous Infusions: . sodium  chloride 1,000 mL (06/06/13 1907)  . heparin 1,600 Units/hr (06/06/13 0808)   PRN Meds:.sodium chloride, sodium chloride, acetaminophen, ALPRAZolam, morphine injection, nitroGLYCERIN, ondansetron (ZOFRAN) IV, sodium chloride, sodium chloride  Assessment/Plan: Principal Problem:   Unstable angina Active Problems:   Type II or unspecified type diabetes mellitus without mention of complication, uncontrolled   Coronary atherosclerosis of native coronary artery   HTN (hypertension)  PLAN: for cardiac cath.  On insulin for uncontrolled diabetes.  Hgb A1C high at 13,  Glucose in 170-252.   Discussed cardiac cath and length of stay.  With Stent if needed until tomorrow.  If medical therapy maybe until tomorrow and medication adjustment.   IV heparin continues.  LOS: 4 days   Time spent with pt. :15 minutes. [facility]  Nurse Practitioner Certified Pager [phone] 06/07/2013, 8:03 AM   I have seen and examined the patient along with Nada Boozer, NP.  I have reviewed the chart, notes and new data.  I agree with NP's note.  BP and glucose slowly improving. Coronary angio +/- PCI today  Rhona Raider, MD, [facility] [phone] 06/07/2013, 10:31 AM

## 2013-06-07 NOTE — CV Procedure (Signed)
PERCUTANEOUS CORONARY INTERVENTION REPORT  NAME: [name] MRN: [id number] DOB: 03-26-1956  ADMIT DATE: 06/03/2013  Performing Cardiologist: Marykay Lex  Primary Physician: Provider Not In System Primary Cardiologist:  Thurmon Fair, MD  Procedures Performed:  Percutaneous Coronary Artery Intervention on Proximal RCA with a Promus Premier DES 3.0 mm x 20 mm; final diameter 3.1 mm distally and 3.25 mm proximally   Indication(s): Unstable Angina  Known coronary artery disease status post prior PCI  Diabetes Mellitus type 2, poorly controlled  Hypertension, poorly controlled  History:   Consent: The procedure with Risks/Benefits/Alternatives and Indications was reviewed with the patien.  All questions were answered.    The patient voice understanding and agree to proceed.   Consent for signed by MD and patient with RN witness -- placed on chart.  PERCUTANEOUS CORONARY INTERVENTION PROCEDURE were  Time Out: Verified patient identification, verified procedure, site/side was marked, verified correct patient position, special equipment/implants available, medications/allergies/relevent history reviewed, required imaging and test results available.  Performed  Hemodynamics:  Central Aortic Pressure: 148/88 mmHg; mean pressure 115 mmHg  Coronary Angiographic Data:    Right Coronary Artery: Moderate to large caliber vessel;  Proximal 95% stenosis; minimal luminal irregularities until it bifurcates into small caliber rPDA & rPAVB.  Right Posterior Descending Artery (RPDA):  < 2mm vessel with mid ~80% stenosis (not good PTCA option)  Right Atrio-ventricular Groove Branch (RPAVB):  Very Small caliber vessel with several branches.  The diagnostic angiography images with Dr. Royann Shivers, the obvious culprit lesion was identified as the proximal RCA lesion.  With the OM1/2 & rPDA lesions not as promising PCI/PTCA options, the decision was made to proceed with percutaneous  coronary intervention.  The existing Sheath was a 6Fr sheath.   A weight based bolus of IV Angiomax was administered and the drip was continued until completion of the procedure with the plan to continue for 2 hours at reduced rate .   Oral Prasugrel 60 mg was administered.  The Guide catheter was advanced over a J-wire and used to engage the Right Coronary Artery. An ACT of > 200 Sec was confirmed prior to advancing the Guidewire.  Lesion #1:  Proximal RCA 95%,  Pre-PCI Stenosis: 95 % Post-PCI Stenosis: 0 %     TIMI 3 flow       TIMI 3 flow  Guide Catheter: JR 4   Guidewire: [place]  Pre-Dilitation Balloon: Emerge MR 2.5 mm x 15 mm   1st Inflation:  6 Atm for 30 Sec Scout angiographydid not reveal evidence of dissection or perforation.   STENT: Promus Premier DES 3.0 mm x 20 mm  1st Inflation: 12 Atm for 30 Sec  2nd Inflation / post-dilation:  14 Atm for 45 Sec  Final distal diameter: 3.1 mm   Post-Dilitation Balloon: [place] 3.25 mm x 12 mm  1st Inflation: 14 Atm for 45 Sec  Final proximal diameter: 3.25 mm   Post-deployment cineangiography with and without guidewire in place was performed in multiple orthogonal views demonstrating excellent stent deployment and did not reveal evidence of dissection or perforation  The sheath was removed in the Cath Lab with a TR band placed at 15 ml Air at 1200 hrs (time).  Reverse Allen's test with plethysmography revealed non-occlusive hemostasis.  The patient was transported to the 6500 Post-Procedure Unit in a Chest Pain free, hemodynamically stable condition.   The patient  was stable before, during and following the procedure.   Patient did tolerate  procedure well. There were not complications.  EBL: < 10 ml UOP ~700 ml post PCI  Medications:  Sedation:  1 mg IV Versed, 50 IV mcg Fentanyl (additional for PCI)  Contrast:  50 ml Omnipaque (total for case 135 ml)  IV Angiomax + Bolus: Reduced to 0.5 mg /kg/hr for 2 hr post  PCI  Effient 60 mg PO  IC Nitroglycerin - 200 mcg x 1  IV Labetalol: 10 ml  Impression:  Successful PCI of the proximal RCA lesion (95% reduced to 0% with TIMI 3 pre & post) -- Promus Premier DES 3. 0 mm x 20 mm.  Residual rPDA lesion in a 1.75 - 2.0 mm vessel, as well as the OM1 & OM2 lesions noted on diagnostic angiography.  Plan:  Angiomax x 2 hrs at reduced rate.  DAPT x 1 yr. CM consulted for Medication assistance.  Continue Aggressive medical therapy of BP, Lipids & DM-2 for residual disease.  Anticipate d/c as early as tomorrow.  The case and results was discussed with the patient (and family). The case and results was not discussed with the patient's PCP. The case and results was discussed with the patient's Cardiologist.  Time Spend Directly with Patient:  30 minutes  HARDING,DAVID W, M.D., M.S. [facility] [address] [place], [place]  [address]  [phone]  06/07/2013 12:03 PM

## 2013-06-07 NOTE — Op Note (Signed)
CARDIAC CATHETERIZATION REPORT   Procedures performed:  1. Left heart catheterization  2. Selective coronary angiography  3. Left ventriculography   Reason for procedure:  Unstable angina pectoris  Procedure performed by: Thurmon Fair, MD, [facility]  Complications: none   Estimated blood loss: less than 5 mL   History:  The patient is a morbidly obese 57 year old female with diabetes mellitus, coronary artery disease, hypertension, gastroesophageal reflux disease, tobacco abuse (quit 2006). She apparently had a myocardial infarction in [place] with a stent placed to the circumflex artery. She has not received medical therapy for approximately 18 months. She now presents with accelerated angina pectoris with chest pain at rest and with minimal exertion. Both her blood pressure and diabetes mellitus have been poorly controlled. The electrocardiogram does not show ST segment elevation. Cardiac enzymes have been normal. However symptoms persist on medical therapy even with walking just a few steps to the bathroom.   Consent: The risks, benefits, and details of the procedure were explained to the patient. Risks including death, MI, stroke, bleeding, limb ischemia, renal failure and allergy were described and accepted by the patient. Informed written consent was obtained prior to proceeding.  Technique: The patient was brought to the cardiac catheterization laboratory in the fasting state. He was prepped and draped in the usual sterile fashion. Local anesthesia with 1% lidocaine was administered to the right wrist area. Using the modified Seldinger technique a 5 French right radial artery sheath was introduced without difficulty. Under fluoroscopic guidance, using 5 French TIG and angled pigtail catheters, selective cannulation of the left coronary artery, right coronary artery and left ventricle were respectively performed. Several coronary angiograms in a variety of projections were recorded, as  well as a left ventriculogram in the RAO projection. Left ventricular pressure and a pull back to the aorta were recorded. No immediate complications occurred. At the end of the procedure, all catheters were removed. After the procedure, hemostasis will be achieved with manual pressure.  Contrast used: 65 mL Omnipaque  Angiographic Findings:  1. The left main coronary artery is free of significant atherosclerosis and bifurcates in the usual fashion into the left anterior descending artery and left circumflex coronary artery.  2. The left anterior descending artery is a large vessel that reaches well beyond the apex and generates only one major diagonal branch. There is evidence of extensive luminal irregularities and mild calcification. No hemodynamically meaningful stenoses are seen. 3. The left circumflex coronary artery is a large-size vessel non- dominant vessel that generates a major oblique marginal arteries. A previously placed stent in the left circumflex coronary artery appears to be widely patent without significant in-stent restenosis, There is evidence of extensive luminal irregularities and no calcification. No hemodynamically meaningful stenoses are seen. The first obtuse marginal artery is subtotally occluded with TIMI 1 flow. It is a small to medium-size vessel, less than 2 mm in diameter. The second obtuse marginal is slightly larger, with a diameter of roughly 2 mm and a long segment of 80-90% stenosis in its proximal third The third of his marginal is diminutive. The fourth obtuse marginal/posterolateral ventricular artery is small in size and has an approximately 80% stenosis. 4. The right coronary artery is a medium-size dominant vessel that generates a small posterior lateral ventricular system as well as the posterior descending artery. There is evidence of  extensive luminal irregularities and minimal calcification. There is an approximately 95% stenosis in the proximal third of  the vessel just beyond the tip  of the diagnostic catheter. The lesion is unchanged after the demonstration of intra-coronary nitroglycerin. No other hemodynamically meaningful stenoses are seen in the main right coronary artery. There is a mid to distal 80% stenosis in a small posterior descending artery, less than 2 mm in diameter 5. The left ventricle is normal in size. The left ventricle systolic function is normal with an estimated ejection fraction of 60 %. Regional wall motion abnormalities are not seen. No left ventricular thrombus is seen. There is no mitral insufficiency. The ascending aorta appears normal. There is no aortic valve stenosis by pullback. The left ventricular end-diastolic pressure is 15 mm Hg.    IMPRESSIONS:  2 vessel coronary artery disease with no LAD artery stenoses and preserved left ventricular systolic function in a patient with poorly controlled diabetes mellitus.  It is hard to distinguish the exact culprit vessel for her unstable symptoms. The most severely diseased vessel (the first obtuse marginal) is not amenable to easy percutaneous revascularization due to its small size and diffuse atherosclerosis. The right coronary artery appears well suited for percutaneous revascularization due to the focal nature of the lesion. The second obtuse marginal lesion is marginally suited for percutaneous revascularization.  Coronary bypass surgery does not appear indicated at this time.  RECOMMENDATION:  Percutaneous revascularization of the proximal stenosis in the right coronary artery.  Consider future revascularization of the second oblique marginal artery   Thurmon Fair, MD, [facility] [phone] office [phone] pager

## 2013-06-08 DIAGNOSIS — E785 Hyperlipidemia, unspecified: Secondary | ICD-10-CM

## 2013-06-08 LAB — CBC
Hemoglobin: 13.7 g/dL (ref 12.0–15.0)
MCHC: 34.5 g/dL (ref 30.0–36.0)
Platelets: 224 10*3/uL (ref 150–400)
WBC: 6.7 10*3/uL (ref 4.0–10.5)

## 2013-06-08 LAB — BASIC METABOLIC PANEL
GFR calc Af Amer: >90 mL/min (ref >90)
GFR calc non Af Amer: >90 mL/min (ref >90)
Glucose, Bld: 255 mg/dL — ABNORMAL HIGH (ref 70–99)
Potassium: 3.4 mEq/L — ABNORMAL LOW (ref 3.5–5.1)
Sodium: 138 mEq/L (ref 135–145)

## 2013-06-08 LAB — GLUCOSE, CAPILLARY
Glucose-Capillary: 218 mg/dL — ABNORMAL HIGH (ref 70–99)
Glucose-Capillary: 131 mg/dL — ABNORMAL HIGH (ref 70–99)
Glucose-Capillary: 189 mg/dL — ABNORMAL HIGH (ref 70–99)

## 2013-06-08 MED ORDER — LISINOPRIL 20 MG PO TABS
20.0000 mg | ORAL_TABLET | Freq: Every day | ORAL | Status: DC
  Administered 2013-06-08 – 2013-06-09 (×2): 20 mg via ORAL
  Filled 2013-06-08 (×2): qty 1

## 2013-06-08 MED ORDER — AMLODIPINE BESYLATE 10 MG PO TABS
10.0000 mg | ORAL_TABLET | Freq: Every day | ORAL | Status: DC
  Administered 2013-06-08 – 2013-06-09 (×2): 10 mg via ORAL
  Filled 2013-06-08 (×2): qty 1

## 2013-06-08 MED ORDER — BUTALBITAL-APAP-CAFFEINE 50-325-40 MG PO TABS
2.0000 | ORAL_TABLET | Freq: Four times a day (QID) | ORAL | Status: DC | PRN
  Administered 2013-06-08 (×2): 2 via ORAL
  Filled 2013-06-08 (×2): qty 2

## 2013-06-08 MED ORDER — INSULIN NPH (HUMAN) (ISOPHANE) 100 UNIT/ML ~~LOC~~ SUSP
23.0000 [IU] | Freq: Two times a day (BID) | SUBCUTANEOUS | Status: DC
  Administered 2013-06-08 – 2013-06-09 (×3): 23 [IU] via SUBCUTANEOUS
  Filled 2013-06-08: qty 10

## 2013-06-08 MED FILL — Dextrose Inj 5%: INTRAVENOUS | Qty: 1000 | Status: AC

## 2013-06-08 NOTE — Progress Notes (Addendum)
Inpatient Diabetes Program Recommendations  AACE/ADA: New Consensus Statement on Inpatient Glycemic Control (2013)  Target Ranges:  Prepandial:   less than 140 mg/dL      Peak postprandial:   less than 180 mg/dL (1-2 hours)      Critically ill patients:  140 - 180 mg/dL   Inpatient Diabetes Program Recommendations Insulin - Basal: convert Lantus to NPH 23 units BID with breakfast and at HS A1C=13.2 ADDENDUM: Diabetes Coordinator spoke with patient concerning A1C=13.2 and DM management at home.  Patient reports she was on insulin Lantus approximately 1.5 years ago.  She lost her job and her insurance and therefore could not afford her insulin anymore.  She wants to take care of her diabetes.  Information was given to patient for ReliOn insulin approximately $25 a vial at [facility].  Patient was also given information for ReliOn meter and strips if she needs to buy a new one.  Patient knows how to draw up and inject her own insulin because she has been on insulin in the past.  Patient assistance forms for Lantus were given to the patient.  She will be discharged on NPH but since she has taken Lantus in the past she may try to get it through the patient assistance program.  She will need to follow up with a primary MD to finish paperwork.   Thank you  Piedad Climes BSN, RN,CDE Inpatient Diabetes Coordinator [phone] (team pager)

## 2013-06-08 NOTE — Care Management Note (Signed)
    Page 1 of 2   06/08/2013     4:50:09 PM   CARE MANAGEMENT NOTE 06/08/2013  Patient:  Elizabeth, [name]   Account Number:  [id number]  Date Initiated:  06/04/2013  Documentation initiated by:  McGIBBONEY,COOKIE  Subjective/Objective Assessment:   pt admitted with cco Chest Pain, Unastable Angina,     Action/Plan:   from home/ Self pay- MATCH program   Anticipated DC Date:  06/09/2013   Anticipated DC Plan:  HOME/SELF CARE  In-house referral  Financial Counselor      DC Planning Services  CM consult  MATCH Program      Choice offered to / List presented to:             Status of service:  In process, will continue to follow Medicare Important Message given?   (If response is "NO", the following Medicare IM given date fields will be blank) Date Medicare IM given:   Date Additional Medicare IM given:    Discharge Disposition:    Per UR Regulation:  Reviewed for med. necessity/level of care/duration of stay  If discussed at Long Length of Stay Meetings, dates discussed:    Comments:  06/08/13- 1600- Donn Pierini RN, BSN [phone] Per review of pt's chart- pt on effient- will need pt assistance with this- [employer] assistance forms left on shadow chart for MD signature- spoke with pt at bedside regarding Effient assistance- pt had pay check stub with her- copy made to send with assistance form once MD signs. Pt may also need assistance with Meds will look at d/c meds once known to see if pt on $4 drugs vs. needing assistance through [facility]- pt also will need PCP- states that she use to be a pt at [facility]- pt familiar with orange card and eligibility- will call in am for appointment and give pt new info on Family Medicine at [facility] clinic along with appointment.  06/07/13.Oletta Cohn, RN, BSN, [employer] [phone] Spoke with pt at bedside concerning discharge planning and need for medication assistance. Pt states she can afford to contribute.  NCM will set up [facility] program prior  to discharge home.  06/04/13 MMcGibboney, RN, BSN Pt to transfer to [facility].

## 2013-06-08 NOTE — Progress Notes (Signed)
CARDIAC REHAB PHASE I   PRE:  Rate/Rhythm: 100SR  BP:  Supine:   Sitting: 174/92  Standing:    SaO2:   MODE:  Ambulation: 550 ft   POST:  Rate/Rhythm: 113 ST  BP:  Supine:   Sitting: 160/105  Standing:    SaO2: 99%RA 0905-0940  Pt holding head stating she has bad headache. Told pt I would come back but she said she would walk. Walked 550 ft on RA with steady gait. Took a couple of standing rest breaks and one sitting break due to SOB. Also stated tightness in right upper side but not like CP that she came in with. BP elevated. Back to bed. Also said she saw floaters. Pt not up to ed today. Will follow up tomorrow. Dr. Rennis Golden in to see pt and aware of headache and BP. Pt not for discharge today.   Luetta Nutting, RN BSN  06/08/2013 9:34 AM

## 2013-06-08 NOTE — Progress Notes (Signed)
Subjective: No complaints s/p stent -Promus DES to prox. RCA, patent stent to LCX and  Residual rPDA lesion in a 1.75 - 2.0 mm vessel, as well as the OM1 & OM2 lesions noted on diagnostic angiography.  EF 60%    Objective: Vital signs in last 24 hours: Temp:  [97.9 F (36.6 C)-98.7 F (37.1 C)] 97.9 F (36.6 C) (06/24 0530) Pulse Rate:  [67-96] 75 (06/24 0530) Resp:  [17-20] 18 (06/24 0530) BP: (134-195)/(77-135) 178/98 mmHg (06/24 0530) SpO2:  [95 %-100 %] 100 % (06/24 0530) Weight:  [225 lb 1.4 oz (102.1 kg)] 225 lb 1.4 oz (102.1 kg) (06/24 0027) Weight change: -1 lb 1.6 oz (-0.5 kg) Last BM Date: 06/06/13 Intake/Output from previous day: +530 06/23 0701 - 06/24 0700 In: 1080 [P.O.:1080] Out: 550 [Urine:550] Intake/Output this shift:    PE: General:Pleasant affect, NAD Skin:Warm and dry, brisk capillary refill Heart:S1S2 RRR without murmur, gallup, rub or click Lungs:clear without rales, rhonchi, or wheezes UJW:JXBJ, non tender, + BS, do not palpate liver spleen or masses Ext:no lower ext pitting edema, 2+ pedal pulses, 2+ radial pulses Neuro:alert and oriented, MAE, follows commands, + facial symmetry    EKG:  SR, Qtc 480 ms. See EKG Lab Results:  Recent Labs  06/07/13 0437 06/08/13 0440  WBC 7.1 6.7  HGB 14.2 13.7  HCT 42.7 39.7  PLT 228 224   BMET  Recent Labs  06/07/13 0830 06/08/13 0440  NA 140 138  K 3.5 3.4*  CL 105 104  CO2 26 25  GLUCOSE 176* 255*  BUN 7 11  CREATININE 0.58 0.61  CALCIUM 9.2 9.0   No results found for this basename: TROPONINI, CK, MB,  in the last 72 hours  Lab Results  Component Value Date   CHOL 199 06/03/2013   HDL 45 06/03/2013   LDLCALC 123* 06/03/2013   TRIG 155* 06/03/2013   CHOLHDL 4.4 06/03/2013   Lab Results  Component Value Date   HGBA1C 13.2* 06/03/2013     Lab Results  Component Value Date   TSH 1.264 06/03/2013        Studies/Results: No results found.  Medications: I have reviewed the  patient's current medications. Scheduled Meds: . aspirin EC  81 mg Oral Daily  . atorvastatin  80 mg Oral q1800  . hydrALAZINE  75 mg Oral Q8H  . insulin aspart  0-9 Units Subcutaneous TID WC  . insulin aspart  3 Units Subcutaneous TID WC  . insulin glargine  36 Units Subcutaneous QHS  . metoprolol tartrate  50 mg Oral BID  . prasugrel  10 mg Oral Daily  . sodium chloride  3 mL Intravenous Q12H   Continuous Infusions:  PRN Meds:.sodium chloride, acetaminophen, ALPRAZolam, morphine injection, nitroGLYCERIN, ondansetron (ZOFRAN) IV, sodium chloride  Assessment/Plan: Principal Problem:   Unstable angina Active Problems:   S/P coronary artery stent placement to RCA with PROMUS DES, 06/07/13, residual disease on OM1, OM2 and rPDA   Coronary atherosclerosis of native coronary artery, with stent to LCX in 2006   Type II or unspecified type diabetes mellitus without mention of complication, uncontrolled   HTN (hypertension)   Hyperlipidemia LDL goal < 70  PLAN: ambulate, if stable discharge.  Despite known CAD pt was not taking any meds prior to admit including no medical therapy for DM with HGB A 1c of 13.2 secondary to lack of insurance and loss of job.  Needs PCP, and will need 70/30 mix insulin or  NPH at discharge per diabetes coordinator secondary to financial issues--here in hospital she is on Lantus..  Also on Effient, will ask care management to see to assist with meds.  Now on lipitor 80 mg , lopressor 50 mg BID, Hydralizine.  BP is elevated.  LOS: 5 days    [facility] R  Nurse Practitioner Certified Pager [phone] 06/08/2013, 7:50 AM

## 2013-06-08 NOTE — Progress Notes (Signed)
Pt's bp is 178/98 and has been in the 190's earlier this evening but came down to 163/77 after receiving her hydralazine and lopressor.  Dr. Lucien Mons notified of this and updated on pt's history and stent placemt yest.  No new orders at this time.

## 2013-06-08 NOTE — Progress Notes (Signed)
Pt. Seen and examined. Agree with the NP/PA-C note as written.  Doing well without residual chest pain. Cost and compliance are going to be issues - she had a DES. Will need to arrange a 1 year effient supply before discharge. BP remains remarkably elevated. Add norvasc 10 mg and lisinopril 20 mg daily today. She is on lopressor 50 mg bid. D/c hydralazine as she is unlikely to comply with TID dosing. BG's not well controlled - not sure she can afford lantus, will need to switch to NPH.   She needs medication optimization before discharge - anticipate possibly later tomorrow if we can get her BP and BG's under control.  Chrystie Nose, MD, [facility] Attending Cardiologist [facility]

## 2013-06-08 NOTE — Progress Notes (Signed)
TR BAND REMOVAL  LOCATION:    right radial  DEFLATED PER PROTOCOL:    yes  TIME BAND OFF / DRESSING APPLIED:    1730   SITE UPON ARRIVAL:    Level 0  SITE AFTER BAND REMOVAL:    Level 0  REVERSE ALLEN'S TEST:     positive  CIRCULATION SENSATION AND MOVEMENT:    Within Normal Limits   yes  COMMENTS:   Removed band and applied gauze dressing secured with tegaderm at 1730. Rechecked site at 1800. Dressing dry and intact, positive reverse allens, CSM's wnls.

## 2013-06-09 DIAGNOSIS — E669 Obesity, unspecified: Secondary | ICD-10-CM

## 2013-06-09 LAB — CBC
HCT: 39.7 % (ref 36.0–46.0)
Hemoglobin: 13.8 g/dL (ref 12.0–15.0)
MCHC: 34.8 g/dL (ref 30.0–36.0)
WBC: 7.4 10*3/uL (ref 4.0–10.5)

## 2013-06-09 LAB — GLUCOSE, CAPILLARY
Glucose-Capillary: 120 mg/dL — ABNORMAL HIGH (ref 70–99)
Glucose-Capillary: 145 mg/dL — ABNORMAL HIGH (ref 70–99)
Glucose-Capillary: 191 mg/dL — ABNORMAL HIGH (ref 70–99)

## 2013-06-09 MED ORDER — AMLODIPINE BESYLATE 10 MG PO TABS: 10.0000 mg | ORAL_TABLET | Freq: Every day | ORAL | Status: DC

## 2013-06-09 MED ORDER — PRASUGREL HCL 10 MG PO TABS: 10.0000 mg | ORAL_TABLET | Freq: Every day | ORAL | Status: DC

## 2013-06-09 MED ORDER — METOPROLOL TARTRATE 50 MG PO TABS: 50.0000 mg | ORAL_TABLET | Freq: Two times a day (BID) | ORAL | Status: DC

## 2013-06-09 MED ORDER — LISINOPRIL 20 MG PO TABS: 20.0000 mg | ORAL_TABLET | Freq: Every day | ORAL | Status: DC

## 2013-06-09 MED ORDER — INSULIN NPH (HUMAN) (ISOPHANE) 100 UNIT/ML ~~LOC~~ SUSP: 23.0000 [IU] | Freq: Two times a day (BID) | SUBCUTANEOUS | Status: DC

## 2013-06-09 MED ORDER — NITROGLYCERIN 0.4 MG SL SUBL: 0.4000 mg | SUBLINGUAL_TABLET | SUBLINGUAL | Status: DC | PRN

## 2013-06-09 MED ORDER — ASPIRIN 81 MG PO TBEC: 81.0000 mg | DELAYED_RELEASE_TABLET | Freq: Every day | ORAL | Status: DC

## 2013-06-09 MED ORDER — ATORVASTATIN CALCIUM 80 MG PO TABS: 80.0000 mg | ORAL_TABLET | Freq: Every day | ORAL | Status: DC

## 2013-06-09 NOTE — Progress Notes (Signed)
CARDIAC REHAB PHASE I   PRE:  Rate/Rhythm: 87 SR  BP:  Supine:   Sitting: 126/76  Standing:    SaO2: 100 RA  MODE:  Ambulation: 550 ft   POST:  Rate/Rhythm: 78  BP:  Supine:   Sitting:   Standing: 139/96   SaO2: 100 RA 1015-1105  Pt tolerated ambulaiton well without c/o of cp or SOB. BP after walk 139/96. Completed stent discharge education with pt. She voices understanding. Pt declines Outpt. CRP due to no transportation.Discussed medication compliance. Pt seems motivated to making life style changes,and gave her encouragement.  Melina Copa RN 06/09/2013 11:00 AM

## 2013-06-09 NOTE — Progress Notes (Signed)
   CARE MANAGEMENT NOTE 06/09/2013  Patient:  [name], [name]   Account Number:  [id number]  Date Initiated:  06/04/2013  Documentation initiated by:  McGIBBONEY,COOKIE  Subjective/Objective Assessment:   pt admitted with cco Chest Pain, Unastable Angina,     Action/Plan:   from home/ Self pay- MATCH program   Anticipated DC Date:  06/09/2013   Anticipated DC Plan:  HOME/SELF CARE  In-house referral  Financial Counselor      DC Planning Services  CM consult  MATCH Program  [facility]      Choice offered to / List presented to:             Status of service:  Completed, signed off Medicare Important Message given?   (If response is "NO", the following Medicare IM given date fields will be blank) Date Medicare IM given:   Date Additional Medicare IM given:    Discharge Disposition:  HOME/SELF CARE  Per UR Regulation:  Reviewed for med. necessity/level of care/duration of stay  If discussed at Long Length of Stay Meetings, dates discussed:    Comments:  06/09/13- 1600- Donn Pierini RN, BSN [phone] Pt for d/c today- call made to [facility]- [facility] Jester who is going to follow up with pt either today or post discharge, paperwork for Effient assistance program faxed to [facility]- also gave pt copy of forms along with 30 day free card- informed pt the [facility] will be getting in touch with her to f/u on medicaid/disability eligibility. Also gave pt info on eligibility appointment made with [facility] for Aug. 6 at 1230- pt also has info on what to bring to that appointment. Will also assist pt with medication through [facility]- letter for [facility] program given to pt and explained how to use along with $3 cost for each prescription - pt voices understanding- all questions answered and pt to d/c home with daughter.  06/08/13- 1600- Donn Pierini RN, BSN [phone] Per review of pt's chart- pt on effient- will need pt assistance with this- [employer] assistance forms left on shadow chart  for MD signature- spoke with pt at bedside regarding Effient assistance- pt had pay check stub with her- copy made to send with assistance form once MD signs. Pt may also need assistance with Meds will look at d/c meds once known to see if pt on $4 drugs vs. needing assistance through [facility]- pt also will need PCP- states that she use to be a pt at [facility]- pt familiar with orange card and eligibility- will call in am for appointment and give pt new info on Family Medicine at [facility] clinic along with appointment.  06/07/13.Oletta Cohn, RN, BSN, [employer] [phone] Spoke with pt at bedside concerning discharge planning and need for medication assistance. Pt states she can afford to contribute.  NCM will set up [facility] program prior to discharge home.  06/04/13 MMcGibboney, RN, BSN Pt to transfer to [facility].

## 2013-06-09 NOTE — Progress Notes (Signed)
Pt discharged to home per MD order. Pt received and reviewed all discharge instructions and medication information including follow-up appointments and prescriptions. Pt verbalized understanding. Pt alert and oriented at discharge with no complaints of pain.  Pt ambulated to private vehicle per pt request. Coates, Aiza Vollrath Elizabeth  

## 2013-06-09 NOTE — Discharge Summary (Signed)
Physician Discharge Summary  Patient ID: [name] MRN: [id number] DOB/AGE: 12/24/1955 57 y.o.  Admit date: 06/03/2013 Discharge date: 06/09/2013  Admission Diagnoses: Unstable Angina  Discharge Diagnoses:  Principal Problem:   Unstable angina Active Problems:   Type II or unspecified type diabetes mellitus without mention of complication, uncontrolled   Coronary atherosclerosis of native coronary artery, with stent to LCX in 2006   HTN (hypertension)   Hyperlipidemia LDL goal < 70   S/P coronary artery stent placement to RCA with PROMUS DES, 06/07/13, residual disease on OM1, OM2 and rPDA   Discharged Condition: stable  Hospital Course: The patient is a morbidly obese 57 year old female with diabetes mellitus, coronary artery disease, hypertension, gastroesophageal reflux disease, and a past history of tobacco abuse (quit 2006). She apparently had a myocardial infarction in [place] in  2006 with a stent placed to the circumflex artery. She has not followed up at [facility] heart and vascular since she was seen at 2009. At that time she was having chest pain in the setting of a GI bleed, which required 2 units of packed red blood cells. The patient also has a history of medical noncompliance and had reported being off all of her home medications for the past 18 months, including Plavix. She presented to the [facility] ER on 06/03/13 with complaints of chest pain, that was concerning for unstable angina. She had endorsed exertional chest pain, described a tightness with associated SOB, diaphoresis and nausea. At time of initial presentation, she was found to be hypertensive with SBP in the 170s. Her EKG was without acute changes and her initial troponin was negative. She was admitted for MI rule out. She was restarted on ASA, a BB, a stain and Hydralazine for afterload reduction. Her troponins were cycled and were negative x 3. However, considering her risk factors, medical noncompliance and  symptoms, a diagnostic left heart cath was recommended. The diagnostic portion was performed by Dr. Sandria Manly. She was found to have 95% stenosis of the proximal RCA. Percutaneous Coronary intervention was recommended. The intervention was performed by Dr. Herbie Baltimore, utilizing a DES. The patient tolerated the procedure well. She was noted to have normal systolic function, with an EF of 60%. There were no WMAs. She left the cath lab in stable condition. She was started on DAPT with ASA and Effient. She had no further chest pain. She did however, remain hypertensive. Norvasc and Lisinopril were added and her BP normalized. She was last seen and examined by Dr. Tresa Endo, who determined she was stable for discharge home. She is scheduled to follow-up with Wilburt Finlay, PA-C, at [facility] on 06/15/13.   Consults: None  Significant Diagnostic Studies:   [facility] 06/07/13 Angiographic Findings:  1. The left main coronary artery is free of significant atherosclerosis and bifurcates in the usual fashion into the left anterior descending artery and left circumflex coronary artery.  2. The left anterior descending artery is a large vessel that reaches well beyond the apex and generates only one major diagonal branch. There is evidence of extensive luminal irregularities and mild calcification. No hemodynamically meaningful stenoses are seen.  3. The left circumflex coronary artery is a large-size vessel non- dominant vessel that generates a major oblique marginal arteries. A previously placed stent in the left circumflex coronary artery appears to be widely patent without significant in-stent restenosis, There is evidence of extensive luminal irregularities and no calcification. No hemodynamically meaningful stenoses are seen.  The first obtuse marginal artery is subtotally occluded  with TIMI 1 flow. It is a small to medium-size vessel, less than 2 mm in diameter.  The second obtuse marginal is slightly larger, with a diameter of  roughly 2 mm and a long segment of 80-90% stenosis in its proximal third  The third of his marginal is diminutive.  The fourth obtuse marginal/posterolateral ventricular artery is small in size and has an approximately 80% stenosis.  4. The right coronary artery is a medium-size dominant vessel that generates a small posterior lateral ventricular system as well as the posterior descending artery. There is evidence of extensive luminal irregularities and minimal calcification. There is an approximately 95% stenosis in the proximal third of the vessel just beyond the tip of the diagnostic catheter. The lesion is unchanged after the demonstration of intra-coronary nitroglycerin. No other hemodynamically meaningful stenoses are seen in the main right coronary artery. There is a mid to distal 80% stenosis in a small posterior descending artery, less than 2 mm in diameter  5. The left ventricle is normal in size. The left ventricle systolic function is normal with an estimated ejection fraction of 60 %. Regional wall motion abnormalities are not seen. No left ventricular thrombus is seen. There is no mitral insufficiency. The ascending aorta appears normal. There is no aortic valve stenosis by pullback. The left ventricular end-diastolic pressure is 15 mm Hg.   Treatments: See Hospital Course  Discharge Exam: Blood pressure 142/82, pulse 77, temperature 98.5 F (36.9 C), temperature source Oral, resp. rate 19, height 5\' 2"  (1.575 m), weight 221 lb 1.9 oz (100.3 kg), SpO2 99.00%.   Disposition: 01-Home or Self Care       Future Appointments Provider Department Dept Phone   06/15/2013 1:40 PM Wilburt Finlay, PA-C [facility] [place] [phone]       Medication List    TAKE these medications       acetaminophen 500 MG tablet  Commonly known as:  TYLENOL  Take 2,000 mg by mouth every 6 (six) hours as needed for pain.     amLODipine 10 MG tablet  Commonly known as:   NORVASC  Take 1 tablet (10 mg total) by mouth daily.     aspirin 81 MG EC tablet  Take 1 tablet (81 mg total) by mouth daily.     atorvastatin 80 MG tablet  Commonly known as:  LIPITOR  Take 1 tablet (80 mg total) by mouth daily at 6 PM.     insulin NPH 100 UNIT/ML injection  Commonly known as:  HUMULIN N,NOVOLIN N  Inject 23 Units into the skin 2 (two) times daily before a meal.     lisinopril 20 MG tablet  Commonly known as:  PRINIVIL,ZESTRIL  Take 1 tablet (20 mg total) by mouth daily.     metoprolol 50 MG tablet  Commonly known as:  LOPRESSOR  Take 1 tablet (50 mg total) by mouth 2 (two) times daily.     nitroGLYCERIN 0.4 MG SL tablet  Commonly known as:  NITROSTAT  Place 1 tablet (0.4 mg total) under the tongue every 5 (five) minutes as needed for chest pain.     prasugrel 10 MG Tabs  Commonly known as:  EFFIENT  Take 1 tablet (10 mg total) by mouth daily.     prasugrel 10 MG Tabs  Commonly known as:  EFFIENT  Take 1 tablet (10 mg total) by mouth daily.       Follow-up Information   Follow up with [facility] (appointment for eligibility- orange card for Aug. 6 at 12:30)    Contact information:   [address] [place], [place] Phone: [phone]      Follow up with Wilburt Finlay, PA-C On 06/15/2013. (1:40 pm)    Contact information:   [address] [place] [address] [phone]      TIME SPENT ON DISCHARGE, INCLUDING PHYSICIAN TIME : > 30 MINUTES  Signed: Allayne Butcher, PA-C 06/09/2013, 4:34 PM

## 2013-06-09 NOTE — Progress Notes (Signed)
Subjective: No complaints.  She has not felt this good in awhile.   Objective: Vital signs in last 24 hours: Temp:  [97.8 F (36.6 C)-98.7 F (37.1 C)] 97.9 F (36.6 C) (06/25 0522) Pulse Rate:  [70-81] 81 (06/25 0934) Resp:  [18] 18 (06/25 0522) BP: (132-160)/(66-84) 132/66 mmHg (06/25 0934) SpO2:  [93 %-99 %] 99 % (06/25 0522) Weight:  [221 lb 1.9 oz (100.3 kg)-221 lb 4.8 oz (100.381 kg)] 221 lb 1.9 oz (100.3 kg) (06/25 0522) Last BM Date: 06/08/13  Intake/Output from previous day: 06/24 0701 - 06/25 0700 In: 963 [P.O.:960; I.V.:3] Out: -  Intake/Output this shift:    Medications Current Facility-Administered Medications  Medication Dose Route Frequency Provider Last Rate Last Dose  . 0.9 %  sodium chloride infusion  250 mL Intravenous PRN Marykay Lex, MD      . acetaminophen (TYLENOL) tablet 650 mg  650 mg Oral Q4H PRN Wilburt Finlay, PA-C   650 mg at 06/07/13 2042  . ALPRAZolam Prudy Feeler) tablet 0.5 mg  0.5 mg Oral TID PRN Thurmon Fair, MD   0.5 mg at 06/06/13 2202  . amLODipine (NORVASC) tablet 10 mg  10 mg Oral Daily Chrystie Nose, MD   10 mg at 06/09/13 0935  . aspirin EC tablet 81 mg  81 mg Oral Daily Lars Mage, MD   81 mg at 06/09/13 0935  . atorvastatin (LIPITOR) tablet 80 mg  80 mg Oral q1800 Lars Mage, MD   80 mg at 06/08/13 1748  . butalbital-acetaminophen-caffeine (FIORICET, ESGIC) 50-325-40 MG per tablet 2 tablet  2 tablet Oral Q6H PRN Nada Boozer, NP   2 tablet at 06/08/13 2101  . insulin aspart (novoLOG) injection 0-9 Units  0-9 Units Subcutaneous TID WC Lars Mage, MD   1 Units at 06/08/13 1748  . insulin aspart (novoLOG) injection 3 Units  3 Units Subcutaneous TID WC Lars Mage, MD   3 Units at 06/09/13 0844  . insulin NPH (HUMULIN N,NOVOLIN N) injection 23 Units  23 Units Subcutaneous BID AC Chrystie Nose, MD   23 Units at 06/09/13 0845  . lisinopril (PRINIVIL,ZESTRIL) tablet 20 mg  20 mg Oral Daily Chrystie Nose, MD   20 mg at 06/09/13 0935    . metoprolol (LOPRESSOR) tablet 50 mg  50 mg Oral BID Thurmon Fair, MD   50 mg at 06/09/13 0935  . morphine 2 MG/ML injection 2 mg  2 mg Intravenous Q4H PRN Lars Mage, MD   2 mg at 06/07/13 2042  . nitroGLYCERIN (NITROSTAT) SL tablet 0.4 mg  0.4 mg Sublingual Q5 min PRN Lars Mage, MD      . ondansetron (ZOFRAN) injection 4 mg  4 mg Intravenous Q6H PRN Wilburt Finlay, PA-C   4 mg at 06/06/13 0129  . prasugrel (EFFIENT) tablet 10 mg  10 mg Oral Daily Marykay Lex, MD   10 mg at 06/09/13 0935  . sodium chloride 0.9 % injection 3 mL  3 mL Intravenous Q12H Marykay Lex, MD   3 mL at 06/09/13 1000  . sodium chloride 0.9 % injection 3 mL  3 mL Intravenous PRN Marykay Lex, MD   3 mL at 06/09/13 0936    PE: General appearance: alert, cooperative and no distress Lungs: clear to auscultation bilaterally Heart: regular rate and rhythm, S1, S2 normal, no murmur, click, rub or gallop Extremities: Trace LEE Pulses: 2+ and symmetric Neurologic: Grossly normal  Lab Results:   Recent Labs  06/07/13 0437 06/08/13 0440 06/09/13 0422  WBC 7.1 6.7 7.4  HGB 14.2 13.7 13.8  HCT 42.7 39.7 39.7  PLT 228 224 242   BMET  Recent Labs  06/07/13 0830 06/08/13 0440  NA 140 138  K 3.5 3.4*  CL 105 104  CO2 26 25  GLUCOSE 176* 255*  BUN 7 11  CREATININE 0.58 0.61  CALCIUM 9.2 9.0   PT/INR  Recent Labs  06/07/13 0830  LABPROT 12.6  INR 0.95    Assessment/Plan   Principal Problem:   Unstable angina Active Problems:   Type II or unspecified type diabetes mellitus without mention of complication, uncontrolled   Coronary atherosclerosis of native coronary artery, with stent to LCX in 2006   HTN (hypertension)   Hyperlipidemia LDL goal < 70   S/P coronary artery stent placement to RCA with PROMUS DES, 06/07/13, residual disease on OM1, OM2 and rPDA  Plan:  s/p stent -Promus DES to prox. RCA, patent stent to LCX and Residual rPDA lesion in a 1.75 - 2.0 mm vessel, as well as the OM1  & OM2 lesions noted on diagnostic angiography. EF 60%.  ASA, Effient, lopressor 50mg  bid, lisinopril 20mg , lipitor, NPH.   BP controlled this AM.    Improved CBGs.  [facility] follow is being arranged.  DC home today.  Medication assistance forms filled out.     LOS: 6 days    HAGER, BRYAN 06/09/2013 10:30 AM   Patient seen and examined. Agree with assessment and plan. No recurrent discomfort. DC today.   Lennette Bihari, MD, [facility] 06/09/2013 1:58 PM    Patient seen and examined. Agree with assessment and plan.   Lennette Bihari, MD, [facility] 06/09/2013 1:58 PM

## 2013-06-15 ENCOUNTER — Encounter: Payer: Self-pay | Admitting: Physician Assistant

## 2013-06-15 ENCOUNTER — Ambulatory Visit (INDEPENDENT_AMBULATORY_CARE_PROVIDER_SITE_OTHER): Payer: Self-pay | Admitting: Physician Assistant

## 2013-06-15 VITALS — BP 112/74 | HR 73 | Ht 62.0 in | Wt 216.4 lb

## 2013-06-15 DIAGNOSIS — IMO0001 Reserved for inherently not codable concepts without codable children: Secondary | ICD-10-CM

## 2013-06-15 DIAGNOSIS — Z9861 Coronary angioplasty status: Secondary | ICD-10-CM

## 2013-06-15 DIAGNOSIS — I2510 Atherosclerotic heart disease of native coronary artery without angina pectoris: Secondary | ICD-10-CM

## 2013-06-15 DIAGNOSIS — Z955 Presence of coronary angioplasty implant and graft: Secondary | ICD-10-CM

## 2013-06-15 DIAGNOSIS — E669 Obesity, unspecified: Secondary | ICD-10-CM | POA: Insufficient documentation

## 2013-06-15 DIAGNOSIS — I10 Essential (primary) hypertension: Secondary | ICD-10-CM

## 2013-06-15 MED ORDER — PRASUGREL HCL 10 MG PO TABS: 10.0000 mg | ORAL_TABLET | Freq: Every day | ORAL | Status: DC

## 2013-06-15 NOTE — Assessment & Plan Note (Signed)
On dual antiplatelet therapy with aspirin and Effient.  Another 30 days samples of them provided. She is getting set up without help serve her primary care provider.

## 2013-06-15 NOTE — Assessment & Plan Note (Signed)
Blood pressure well controlled at this time. 

## 2013-06-15 NOTE — Patient Instructions (Signed)
Followup in 3 months with Dr. Salena Saner.  Continue to progress with her daily walking. In 6 months you should be up to 60 minutes per day.

## 2013-06-15 NOTE — Addendum Note (Signed)
Addended by: Neta Ehlers on: 06/15/2013 02:49 PM   Modules accepted: Orders

## 2013-06-15 NOTE — Assessment & Plan Note (Signed)
We discussed dietary modifications and exercise.

## 2013-06-15 NOTE — Progress Notes (Signed)
Patient ID: [name], female   DOB: 04/18/1956, 57 y.o.   MRN: [id number]    Date:  06/15/2013   ID:  [name], DOB [birth year], MRN [id number]  PCP:  No PCP Per Patient  Primary Cardiologist:       History of Present Illness: [name] is a 57 y.o. female The patient is a morbidly obese 57 year old female with diabetes mellitus, coronary artery disease, hypertension, gastroesophageal reflux disease, and a past history of tobacco abuse (quit 2006). She apparently had a myocardial infarction in [place] in 2006 with a stent placed to the circumflex artery. She has not followed up at [facility] heart and vascular since she was seen at 2009. At that time she was having chest pain in the setting of a GI bleed, which required 2 units of packed red blood cells. The patient also has a history of medical noncompliance and had reported being off all of her home medications for the past 18 months, including Plavix. She presented to the [facility] ER on 06/03/13 with complaints of chest pain, that was concerning for unstable angina. She had endorsed exertional chest pain, described a tightness with associated SOB, diaphoresis and nausea. At time of initial presentation, she was found to be hypertensive with SBP in the 170s. Her EKG was without acute changes and her initial troponin was negative. She was admitted for MI rule out. She was restarted on ASA, a BB, a stain and Hydralazine for afterload reduction. Her troponins were cycled and were negative x 3. However, considering her risk factors, medical noncompliance and symptoms, a diagnostic left heart cath was recommended. The diagnostic portion was performed by Dr. Sandria Manly. She was found to have 95% stenosis of the proximal RCA. Percutaneous Coronary intervention was recommended. The intervention was performed by Dr. Herbie Baltimore, utilizing a DES. The patient tolerated the procedure well. She was noted to have normal systolic function, with an EF of 60%. There  were no WMAs. She left the cath lab in stable condition. She was started on DAPT with ASA and Effient.  She did however, remain hypertensive. Norvasc and Lisinopril were added and her BP normalized.  Patient presents today for followup to her hospitalization and stent placement. She reports doing very well she is back at work at the [employer].  The patient currently denies nausea, vomiting, fever, chest pain, shortness of breath, orthopnea, dizziness, PND, cough, congestion, abdominal pain, hematochezia, melena, lower extremity edema, claudication.  Wt Readings from Last 3 Encounters:  06/15/13 216 lb 6.4 oz (98.158 kg)  06/09/13 221 lb 1.9 oz (100.3 kg)  06/09/13 221 lb 1.9 oz (100.3 kg)     Past Medical History  Diagnosis Date  . Diabetes mellitus without complication   . Heart attack   . GERD (gastroesophageal reflux disease)   . Hyperlipidemia LDL goal < 70 06/07/2013  . Coronary atherosclerosis of native coronary artery, with stent to LCX in 2006 06/04/2013  . S/P coronary artery stent placement to RCA with PROMUS DES, 06/07/13, residual disease on OM1, OM2 and rPDA 06/07/2013    Current Outpatient Prescriptions  Medication Sig Dispense Refill  . acetaminophen (TYLENOL) 500 MG tablet Take 2,000 mg by mouth every 6 (six) hours as needed for pain.      Marland Kitchen amLODipine (NORVASC) 10 MG tablet Take 1 tablet (10 mg total) by mouth daily.  30 tablet  5  . aspirin EC 81 MG EC tablet Take 1 tablet (81 mg total) by mouth  daily.      . atorvastatin (LIPITOR) 80 MG tablet Take 1 tablet (80 mg total) by mouth daily at 6 PM.  30 tablet  5  . insulin NPH (HUMULIN N,NOVOLIN N) 100 UNIT/ML injection Inject 23 Units into the skin 2 (two) times daily before a meal.  1 vial  12  . lisinopril (PRINIVIL,ZESTRIL) 20 MG tablet Take 1 tablet (20 mg total) by mouth daily.  30 tablet  5  . metoprolol (LOPRESSOR) 50 MG tablet Take 1 tablet (50 mg total) by mouth 2 (two) times daily.  60 tablet  5  .  nitroGLYCERIN (NITROSTAT) 0.4 MG SL tablet Place 1 tablet (0.4 mg total) under the tongue every 5 (five) minutes as needed for chest pain.  25 tablet  2  . prasugrel (EFFIENT) 10 MG TABS Take 1 tablet (10 mg total) by mouth daily.  30 tablet  10   No current facility-administered medications for this visit.    Allergies:   No Known Allergies  Social History:  The patient  reports that she quit smoking about 8 years ago. She has never used smokeless tobacco. She reports that  drinks alcohol. She reports that she does not use illicit drugs.   Family history:  History reviewed. No pertinent family history.  ROS:  Please see the history of present illness.  All other systems reviewed and negative.   PHYSICAL EXAM: VS:  BP 112/74  Pulse 73  Ht 5\' 2"  (1.575 m)  Wt 216 lb 6.4 oz (98.158 kg)  BMI 39.57 kg/m2 Morbidly obese , well developed, in no acute distress HEENT: Pupils are equal round react to light accommodation extraocular movements are intact.  Neck: no JVDNo cervical lymphadenopathy. Cardiac: Regular rate and rhythm without murmurs rubs or gallops. Lungs:  clear to auscultation bilaterally, no wheezing, rhonchi or rales Ext: no lower extremity edema.  2+ radial and dorsalis pedis pulses. Skin: warm and dryRight radial cath site is healing well. Good radial and ulnar pulses. Neuro:  Grossly normal. Strength 5 out of 5 equal in the upper and lower extremities  EKG:  Normal sinus rhythm rate 73 beats per minute  ASSESSMENT AND PLAN:  Problem List Items Addressed This Visit   Type II or unspecified type diabetes mellitus without mention of complication, uncontrolled   S/P coronary artery stent placement to RCA with PROMUS DES, 06/07/13, residual disease on OM1, OM2 and rPDA     On dual antiplatelet therapy with aspirin and Effient.  Another 30 days samples of them provided. She is getting set up without help serve her primary care provider.     Morbid obesity     We discussed  dietary modifications and exercise.    HTN (hypertension)     Blood pressure well controlled at this time.      Other Visit Diagnoses   CAD (coronary artery disease)    -  Primary    Relevant Orders       EKG 12-Lead       Follow up in 3 months with Dr. Salena Saner.

## 2013-08-12 ENCOUNTER — Other Ambulatory Visit: Payer: Self-pay | Admitting: Cardiology

## 2013-08-12 NOTE — Telephone Encounter (Signed)
Rx was sent to pharmacy electronically. 

## 2013-09-21 ENCOUNTER — Ambulatory Visit: Payer: Self-pay | Admitting: Cardiovascular Disease

## 2014-11-24 ENCOUNTER — Encounter (HOSPITAL_COMMUNITY): Payer: Self-pay | Admitting: Cardiovascular Disease

## 2014-12-16 DIAGNOSIS — I209 Angina pectoris, unspecified: Secondary | ICD-10-CM

## 2014-12-16 HISTORY — DX: Angina pectoris, unspecified: I20.9

## 2015-05-29 ENCOUNTER — Emergency Department (HOSPITAL_COMMUNITY)
Admission: EM | Admit: 2015-05-29 | Discharge: 2015-05-29 | Disposition: A | Discharge status: Home / Self Care | Payer: Self-pay | Attending: Emergency Medicine | Admitting: Emergency Medicine

## 2015-05-29 ENCOUNTER — Encounter (HOSPITAL_COMMUNITY): Payer: Self-pay | Admitting: Emergency Medicine

## 2015-05-29 ENCOUNTER — Emergency Department (HOSPITAL_COMMUNITY): Payer: Self-pay

## 2015-05-29 DIAGNOSIS — R110 Nausea: Secondary | ICD-10-CM | POA: Insufficient documentation

## 2015-05-29 DIAGNOSIS — Z79899 Other long term (current) drug therapy: Secondary | ICD-10-CM | POA: Insufficient documentation

## 2015-05-29 DIAGNOSIS — I2510 Atherosclerotic heart disease of native coronary artery without angina pectoris: Secondary | ICD-10-CM | POA: Insufficient documentation

## 2015-05-29 DIAGNOSIS — R739 Hyperglycemia, unspecified: Secondary | ICD-10-CM

## 2015-05-29 DIAGNOSIS — Z87891 Personal history of nicotine dependence: Secondary | ICD-10-CM | POA: Insufficient documentation

## 2015-05-29 DIAGNOSIS — Z9861 Coronary angioplasty status: Secondary | ICD-10-CM | POA: Insufficient documentation

## 2015-05-29 DIAGNOSIS — E785 Hyperlipidemia, unspecified: Secondary | ICD-10-CM | POA: Insufficient documentation

## 2015-05-29 DIAGNOSIS — K047 Periapical abscess without sinus: Secondary | ICD-10-CM | POA: Insufficient documentation

## 2015-05-29 DIAGNOSIS — Z7982 Long term (current) use of aspirin: Secondary | ICD-10-CM | POA: Insufficient documentation

## 2015-05-29 DIAGNOSIS — E1165 Type 2 diabetes mellitus with hyperglycemia: Secondary | ICD-10-CM | POA: Insufficient documentation

## 2015-05-29 LAB — CBC WITH DIFFERENTIAL/PLATELET
BASOS PCT: 0 % (ref 0–1)
Basophils Absolute: 0 10*3/uL (ref 0.0–0.1)
Eosinophils Absolute: 0.1 10*3/uL (ref 0.0–0.7)
Eosinophils Relative: 1 % (ref 0–5)
HEMATOCRIT: 48.5 % — AB (ref 36.0–46.0)
HEMOGLOBIN: 16.4 g/dL — AB (ref 12.0–15.0)
LYMPHS ABS: 2.1 10*3/uL (ref 0.7–4.0)
LYMPHS PCT: 21 % (ref 12–46)
MCH: 29.7 pg (ref 26.0–34.0)
MCHC: 33.8 g/dL (ref 30.0–36.0)
MCV: 87.9 fL (ref 78.0–100.0)
MONOS PCT: 8 % (ref 3–12)
Monocytes Absolute: 0.8 10*3/uL (ref 0.1–1.0)
NEUTROS ABS: 7.1 10*3/uL (ref 1.7–7.7)
NEUTROS PCT: 70 % (ref 43–77)
Platelets: 243 10*3/uL (ref 150–400)
RBC: 5.52 MIL/uL — ABNORMAL HIGH (ref 3.87–5.11)
RDW: 12.7 % (ref 11.5–15.5)
WBC: 10.1 10*3/uL (ref 4.0–10.5)

## 2015-05-29 LAB — BASIC METABOLIC PANEL
Anion gap: 10 (ref 5–15)
BUN: 10 mg/dL (ref 6–20)
CALCIUM: 9.3 mg/dL (ref 8.9–10.3)
CO2: 27 mmol/L (ref 22–32)
CREATININE: 0.64 mg/dL (ref 0.44–1.00)
Chloride: 101 mmol/L (ref 101–111)
GFR calc Af Amer: >60 mL/min (ref >60)
GFR calc non Af Amer: >60 mL/min (ref >60)
GLUCOSE: 317 mg/dL — AB (ref 65–99)
Potassium: 3.8 mmol/L (ref 3.5–5.1)
SODIUM: 138 mmol/L (ref 135–145)

## 2015-05-29 LAB — CBG MONITORING, ED
GLUCOSE-CAPILLARY: 295 mg/dL — AB (ref 65–99)
Glucose-Capillary: 334 mg/dL — ABNORMAL HIGH (ref 65–99)
Glucose-Capillary: 243 mg/dL — ABNORMAL HIGH (ref 65–99)

## 2015-05-29 MED ORDER — IOHEXOL 300 MG/ML  SOLN
100.0000 mL | Freq: Once | INTRAMUSCULAR | Status: AC | PRN
  Administered 2015-05-29: 100 mL via INTRAVENOUS

## 2015-05-29 MED ORDER — CLINDAMYCIN PHOSPHATE 600 MG/50ML IV SOLN
600.0000 mg | Freq: Once | INTRAVENOUS | Status: AC
  Administered 2015-05-29: 600 mg via INTRAVENOUS
  Filled 2015-05-29: qty 50

## 2015-05-29 MED ORDER — SODIUM CHLORIDE 0.9 % IV BOLUS (SEPSIS)
500.0000 mL | Freq: Once | INTRAVENOUS | Status: AC
  Administered 2015-05-29: 500 mL via INTRAVENOUS

## 2015-05-29 MED ORDER — OXYCODONE-ACETAMINOPHEN 5-325 MG PO TABS
1.0000 | ORAL_TABLET | Freq: Once | ORAL | Status: AC
  Administered 2015-05-29: 1 via ORAL
  Filled 2015-05-29: qty 1

## 2015-05-29 MED ORDER — CLINDAMYCIN HCL 300 MG PO CAPS: 300.0000 mg | ORAL_CAPSULE | Freq: Four times a day (QID) | ORAL | Status: DC

## 2015-05-29 MED ORDER — HYDROCODONE-ACETAMINOPHEN 5-325 MG PO TABS: 1.0000 | ORAL_TABLET | Freq: Four times a day (QID) | ORAL | Status: DC | PRN

## 2015-05-29 MED ORDER — METFORMIN HCL 500 MG PO TABS: 500.0000 mg | ORAL_TABLET | Freq: Two times a day (BID) | ORAL | Status: DC

## 2015-05-29 NOTE — ED Notes (Signed)
Cleared vital signs/BP with PA prior to pt leaving, pt advised to follow up with primary doctor about BP

## 2015-05-29 NOTE — Discharge Instructions (Signed)
Return to the emergency room with worsening of symptoms, new symptoms or with symptoms that are concerning, especially fevers, unable to open mouth fully, drooling, chest pain, shortness of breath, nausea, vomiting. Call your dentist/oral surgeon tomorrow morning for follow up appointment in 1-2 days. Please take all of your antibiotics until finished!   You may develop abdominal discomfort or diarrhea from the antibiotic.  You may help offset this with probiotics which you can buy or get in yogurt. Do not eat  or take the probiotics until 2 hours after your antibiotic.  Norco for pain. Do not operate machinery, drive or drink alcohol while taking narcotics or muscle relaxers. Please call your doctor for a followup appointment within 24-48 hours. When you talk to your doctor please let them know that you were seen in the emergency department and have them acquire all of your records so that they can discuss the findings with you and formulate a treatment plan to fully care for your new and ongoing problems. If you do not have a primary care provider please call the number below under ED resources to establish care with a provider and follow up.  Read below information and follow recommendations. Dental Abscess A dental abscess is a collection of infected fluid (pus) from a bacterial infection in the inner part of the tooth (pulp). It usually occurs at the end of the tooth's root.  CAUSES   Severe tooth decay.  Trauma to the tooth that allows bacteria to enter into the pulp, such as a broken or chipped tooth. SYMPTOMS   Severe pain in and around the infected tooth.  Swelling and redness around the abscessed tooth or in the mouth or face.  Tenderness.  Pus drainage.  Bad breath.  Bitter taste in the mouth.  Difficulty swallowing.  Difficulty opening the mouth.  Nausea.  Vomiting.  Chills.  Swollen neck glands. DIAGNOSIS   A medical and dental history will be taken.  An  examination will be performed by tapping on the abscessed tooth.  X-rays may be taken of the tooth to identify the abscess. TREATMENT The goal of treatment is to eliminate the infection. You may be prescribed antibiotic medicine to stop the infection from spreading. A root canal may be performed to save the tooth. If the tooth cannot be saved, it may be pulled (extracted) and the abscess may be drained.  HOME CARE INSTRUCTIONS  Only take over-the-counter or prescription medicines for pain, fever, or discomfort as directed by your caregiver.  Rinse your mouth (gargle) often with salt water ( tsp salt in 8 oz [250 ml] of warm water) to relieve pain or swelling.  Do not drive after taking pain medicine (narcotics).  Do not apply heat to the outside of your face.  Return to your dentist for further treatment as directed. SEEK MEDICAL CARE IF:  Your pain is not helped by medicine.  Your pain is getting worse instead of better. SEEK IMMEDIATE MEDICAL CARE IF:  You have a fever or persistent symptoms for more than 2-3 days.  You have a fever and your symptoms suddenly get worse.  You have chills or a very bad headache.  You have problems breathing or swallowing.  You have trouble opening your mouth.  You have swelling in the neck or around the eye. Document Released: 12/02/2005 Document Revised: 08/26/2012 Document Reviewed: 03/12/2011 [facility] Patient Information 2015 [place], [place]. This information is not intended to replace advice given to you by your health care provider.  Make sure you discuss any questions you have with your health care provider.    Emergency Department Resource Guide 1) Find a Doctor and Pay Out of Pocket Although you won't have to find out who is covered by your insurance plan, it is a good idea to ask around and get recommendations. You will then need to call the office and see if the doctor you have chosen will accept you as a new patient and what types  of options they offer for patients who are self-pay. Some doctors offer discounts or will set up payment plans for their patients who do not have insurance, but you will need to ask so you aren't surprised when you get to your appointment.  2) Contact Your Local Health Department Not all health departments have doctors that can see patients for sick visits, but many do, so it is worth a call to see if yours does. If you don't know where your local health department is, you can check in your phone book. The CDC also has a tool to help you locate your state's health department, and many state websites also have listings of all of their local health departments.  3) Find a [facility] If your illness is not likely to be very severe or complicated, you may want to try a walk in clinic. These are popping up all over the country in pharmacies, drugstores, and shopping centers. They're usually staffed by nurse practitioners or physician assistants that have been trained to treat common illnesses and complaints. They're usually fairly quick and inexpensive. However, if you have serious medical issues or chronic medical problems, these are probably not your best option.  No Primary Care Doctor: - Call Health Connect at  [phone] - they can help you locate a primary care doctor that  accepts your insurance, provides certain services, etc. - Physician Referral Service- [phone]  Chronic Pain Problems: Organization         Address  Phone   Notes  [facility]  [phone] Patients need to be referred by their primary care doctor.   Medication Assistance: Organization         Address  Phone   Notes  [facility] Medication [facility] [address] [place], [place] [address] [phone] --Must be a resident of [facility] -- Must have NO insurance coverage whatsoever (no Medicaid/ Medicare, etc.) -- The pt. MUST have a primary care doctor that  directs their care regularly and follows them in the community   MedAssist  [phone]   [employer]  [phone]    Agencies that provide inexpensive medical care: Organization         Address  Phone   Notes  [place]  [phone]   [facility]    [phone]   [facility] [place], [place] [address] [phone]   [place] [address] [place] [phone]   Planned Parenthood    [phone]   [facility]    [phone]   [place] and [place] [address], [place] Phone:  [phone], Fax:  [phone] Hours of Operation:  9 am - 6 pm, M-F.  Also accepts Medicaid/Medicare and self-pay.  [facility] for [address], [address], [place] Phone: [phone], Fax: [phone]. Hours of Operation:  8:30 am - 5:30 pm, M-F.  Also accepts Medicaid and self-pay.  [facility] [place] [address] [place] Phone: [phone]   [place], [place], [place] [phone], Ext. [phone] Mondays & Thursdays: 7-9 AM.  First 15 patients are seen on a first come, first serve basis.    [place] Providers:  Organization         Address  Phone   Notes  [facility] [address], [place] [phone] Also accepts self-pay patients.  [facility] [address], [place]  [phone]   [place], [address], [place] [phone]   [facility] [address] [place] [phone]   Lucianne Lei [address], [place]   [phone] Only accepts [place] Access [place] patients after they have their name applied to their card.   Self-Pay (no insurance) in [facility]:  Organization          Address  Phone   Notes  Sickle Cell Patients, [facility] Internal Medicine [place] [phone]   [facility] Urgent Care [place] [phone]   [facility] [place]  [place], [place], [place] [phone]   [facility]/Dr. Osei-Bonsu  [address], [place] or [address], [place] [phone] Phone number for both [place] and [place] locations is the same.  Urgent Medical and [facility] [address] [place] [phone]   [facility] [place] [address], [place] or [address] [phone]   [facility] [address], [place] [phone], phone; [phone], fax Sees patients 1st and 3rd Saturday of every month.  Must not qualify for public or private insurance (i.e. Medicaid, Medicare, [place] Health Choice, Veterans' Benefits)  Household income should be no more than 200% of the poverty level The clinic cannot treat you if you are pregnant or think you are pregnant  Sexually transmitted diseases are not treated at the clinic.    Dental Care: Organization         Address  Phone  Notes  [facility] Department of [facility] [phone] Accepts children up to age 29 who are enrolled in [place] or [place]; pregnant women with a Medicaid card; and children who have applied for Medicaid or [place] Health Choice, but were declined, whose parents can pay a reduced fee at time of service.  [facility] Department of [facility]  [address], [place] [phone] Accepts children up to age 74 who are enrolled in [place] or [place]; pregnant women with a Medicaid card; and children who have applied for Medicaid or [place] Health Choice, but were declined, whose parents can pay a reduced fee at time of  service.  [place] Adult Dental Access PROGRAM  [place] [phone] Patients are seen by appointment only. Walk-ins are not accepted. [place] will see patients 60 years of age and older. Monday - Tuesday (8am-5pm) Most Wednesdays (8:30-5pm) $30 per visit, cash only  [facility] Adult Dental Access PROGRAM  [address], [facility] [phone] Patients are seen by appointment  only. Walk-ins are not accepted. [place] will see patients 47 years of age and older. One Wednesday Evening (Monthly: Volunteer Based).  $30 per visit, cash only  [place]  [phone] for adults; Children under age 66, call Graduate Pediatric Dentistry at [phone]. Children aged 42-14, please call [phone] to request a pediatric application.  Dental services are provided in all areas of dental care including fillings, crowns and bridges, complete and partial dentures, implants, gum treatment, root canals, and extractions. Preventive care is also provided. Treatment is provided to both adults and children. Patients are selected via a lottery and there is often a waiting list.   [facility] [address], [place]  [phone] [email or web address]   [facility] [address] [place], [place] [phone], Ext. [phone] Second and Fourth Thursday of each month, opens at 6:30 AM; Clinic ends at 9 AM.  Patients are seen on a first-come first-served basis, and a limited number are seen during each clinic.   [facility]  [address] Hillard Danker [place], [place] [phone]   Eligibility Requirements You must have lived in [place], [place], or [place] counties for at least the last three months.   You cannot be eligible for state or federal sponsored [employer], including [employer], [place], or [employer].   You generally cannot be eligible for healthcare insurance through your employer.     How to apply: Eligibility screenings are held every Tuesday and Wednesday afternoon from 1:00 pm until 4:00 pm. You do not need an appointment for the interview!  [facility] [address], [place], [place]   [place]  [place] Department  [place]  [phone]    Behavioral Health Resources in the Community: Intensive Outpatient Programs Organization         Address  Phone  Notes  [place]. [address], [place], [place] [phone]   [facility] Outpatient [address] [place], [place]   ADS: Alcohol & Drug Svcs [address] [place], [place]   [place] [address],  [place], [place] or [phone]   Substance Abuse Resources Organization         Address  Phone  Notes  Alcohol and Drug Services  [phone]   [place]  [phone]   [facility]   Chinita Pester  [phone]   Residential & Outpatient Substance Abuse Program  [phone]   Psychological Services Organization         Address  Phone  Notes  [facility]  [place]  [phone]   [place] [address] [place] or [phone]    Mobile Crisis Teams Organization         Address  Phone  Notes  [facility], Mobile Crisis Care Unit  [phone]   [facility]  [address] [place], [place]   [place] Levels [address], [place] [phone]    Self-Help/Support Groups Organization         Address  Phone             Notes  [place]. of [place] - variety of support groups  [place] Call for more information  Narcotics  Anonymous (NA), [facility]aring [facility] [address]  Dr, [employer] [place]  2 meetings at this location   Residential Facilities manager         Address  Phone  Notes  [facility] [place],    [place]  [phone]   [facility]  [address] [place] 032122, [place], [place]   [place], Section [phone] Admissions: 8am-3pm M-F  Incentives Substance [place] [address]    [place], [place] [phone]   [facility] [address] [place], [place], [place]   [facility] [address],  [place], [place]   [facility] - Intensive Outpatient [address] Kristeen [address], [place], [place]   [facility] ([place].) [place].,  [place], [place] [phone] or [phone]   [facility] Services (RTS) [address] [place], [place] Accepts Medicaid  [facility] [place] [address]  [place] [phone] Substance Abuse/Addiction Treatment   [facility] Organization         Address  Phone  Notes  [facility]  [phone]   Domenic Schwab, PhD [address] Arlis Porta [place], [place]   [phone] or [phone]   [place], [place] [phone]   [facility] [address] [place], [place] [phone] Insurance/Medicaid/sponsorship through [facility] and Families [address] Ste [place]                                    [place], [place] [phone] [place] [address], [place] [phone]    Dr. Adele Schilder  [phone]   [facility] Dept. 1) [address] [place] 2) [place] 3)  [place] [address], [place] [phone]  [phone]   [place] [phone] or [phone] (After Hours)

## 2015-05-29 NOTE — Progress Notes (Addendum)
[facility] consulted to speak to patient regarding pcp follow up and medication assistance.  EDCM spoke to patient at bedside. Patient confirms she does not have a pcp or insurance living in [place].  [facility] provided patient with pamphlet to [facility], informed patient of services there and walk in times.  EDCM also provided patient with list of pcps who accept self pay patients, list of discount pharmacies and websites needymeds.org and GoodRX.com for medication assistance, phone number to inquire about the orange card, phone number to inquire about Mediciad, phone number to inquire about the [place], financial resources in the community such as local churches, salvation army, urban ministries, and dental assistance for uninsured patients.  [facility] provided patient with coupon for clindamycin at [facility].  Informed patient Metformin is free at [employer] and four dollars at [place].  Patient has given [facility] permission to email [facility] in attempts for follow appointment. Patient confirms she has a glucometer, "I have all of that.  I've just been lazy."  Patient thankful for resources.  No further EDCM needs at this time.

## 2015-05-29 NOTE — ED Provider Notes (Signed)
CSN: [id number]     Arrival date & time 05/29/15  1241 History  This chart was scribed for non-physician practitioner, Al Corpus, PA-C working with Orpah Greek, MD by Rayna Sexton, ED scribe. This patient was seen in room WTR6/WTR6 and the patient's care was started at 2:37 PM.    Chief Complaint  Patient presents with  . Dental Pain    The history is provided by the patient. No language interpreter was used.    HPI Comments: [name] is a 59 y.o. female, with a history of DM, who presents to the Emergency Department complaining of mild, worsening, lower left dental pain with onset 1 day ago. Pt notes chipping a tooth in the affected area 1 year ago with pain beginning intermittently 3 months ago. She normally visits her dental provider but was unable to so she came to the ED. She additionally notes nausea, chills, and difficulty opening mouth as associated symptoms. Pt notes worsening of pain when lying down or ambulating her head. Pt has taken hydrocodone for her pain with little relief of symptoms.  Pt denies taking any medications for her DM in the last year. She additionally denies fever, vomiting, and drooling.  Past Medical History  Diagnosis Date  . Diabetes mellitus without complication   . Heart attack   . GERD (gastroesophageal reflux disease)   . Hyperlipidemia LDL goal < 70 06/07/2013  . Coronary atherosclerosis of native coronary artery, with stent to LCX in 2006 06/04/2013  . S/P coronary artery stent placement to RCA with PROMUS DES, 06/07/13, residual disease on OM1, OM2 and rPDA 06/07/2013   Past Surgical History  Procedure Laterality Date  . Coronary stent placement  approx 2005    1 stent  . Left heart catheterization with coronary angiogram N/A 06/07/2013    Procedure: LEFT HEART CATHETERIZATION WITH CORONARY ANGIOGRAM;  Surgeon: Sanda Klein, MD;  Location: [facility];  Service: Cardiovascular;  Laterality: N/A;   History reviewed. No pertinent  family history. History  Substance Use Topics  . Smoking status: Former Smoker -- 25 years    Quit date: 07/03/2004  . Smokeless tobacco: Never Used  . Alcohol Use: Yes     Comment: ocassionally    OB History    No data available     Review of Systems  Constitutional: Positive for chills. Negative for fever.  HENT: Positive for dental problem and facial swelling.   Gastrointestinal: Positive for nausea. Negative for vomiting.      Allergies  Review of patient's allergies indicates no known allergies.  Home Medications   Prior to Admission medications   Medication Sig Start Date End Date Taking? Authorizing Provider  acetaminophen (TYLENOL) 500 MG tablet Take 2,000 mg by mouth every 6 (six) hours as needed for pain.    Historical Provider, MD  amLODipine (NORVASC) 10 MG tablet Take 1 tablet (10 mg total) by mouth daily. 06/09/13   Brittainy Erie Noe, PA-C  aspirin EC 81 MG EC tablet Take 1 tablet (81 mg total) by mouth daily. 06/09/13   Brittainy Erie Noe, PA-C  atorvastatin (LIPITOR) 80 MG tablet Take 1 tablet (80 mg total) by mouth daily at 6 PM. 06/09/13   Brittainy M Rosita Fire, PA-C  clindamycin (CLEOCIN) 300 MG capsule Take 1 capsule (300 mg total) by mouth 4 (four) times daily. X 7 days 05/29/15   Al Corpus, PA-C  EFFIENT 10 MG TABS tablet TAKE ONE TABLET BY MOUTH ONCE DAILY 08/12/13   Gaspar Bidding  Willadean Carol, PA-C  HYDROcodone-acetaminophen (NORCO/VICODIN) 5-325 MG per tablet Take 1-2 tablets by mouth every 6 (six) hours as needed. 05/29/15   Al Corpus, PA-C  insulin NPH (HUMULIN N,NOVOLIN N) 100 UNIT/ML injection Inject 23 Units into the skin 2 (two) times daily before a meal. 06/09/13   Brittainy M Simmons, PA-C  lisinopril (PRINIVIL,ZESTRIL) 20 MG tablet Take 1 tablet (20 mg total) by mouth daily. 06/09/13   Brittainy Erie Noe, PA-C  metFORMIN (GLUCOPHAGE) 500 MG tablet Take 1 tablet (500 mg total) by mouth 2 (two) times daily with a meal. 05/29/15   Al Corpus, PA-C   metoprolol (LOPRESSOR) 50 MG tablet Take 1 tablet (50 mg total) by mouth 2 (two) times daily. 06/09/13   Brittainy Erie Noe, PA-C  nitroGLYCERIN (NITROSTAT) 0.4 MG SL tablet Place 1 tablet (0.4 mg total) under the tongue every 5 (five) minutes as needed for chest pain. 06/09/13   Brittainy M Simmons, PA-C   BP 199/102 mmHg  Pulse 85  Temp(Src) 98.4 F (36.9 C) (Oral)  Resp 20  SpO2 99% Physical Exam  Constitutional: She appears well-developed and well-nourished. No distress.  HENT:  Head: Normocephalic and atraumatic.  Mouth/Throat: Oropharynx is clear and moist. No oropharyngeal exudate.  No trismus or uvula deviation No lip, tongue. No swelling under tongue or tenderness. Patient with left-sided lower face swelling to left chin and to mid mandible with area of cellulitis. Patient handling secretions. No drooling. Patient with poor dentition. Patient with tenderness to left lower molar #19 with moderate erythema in this gingiva but no drainable abscess.   Eyes: Conjunctivae are normal. Right eye exhibits no discharge. Left eye exhibits no discharge.  Neck: Normal range of motion. Neck supple.  No neck masses or tenderness.  Cardiovascular: Normal rate and regular rhythm.   Pulmonary/Chest: Effort normal and breath sounds normal. No respiratory distress. She has no wheezes.  Abdominal: Soft. She exhibits no distension. There is no tenderness.  Lymphadenopathy:    She has no cervical adenopathy.  Neurological: She is alert. Coordination normal.  Skin: Skin is warm and dry. She is not diaphoretic.  Nursing note and vitals reviewed.   ED Course  Procedures  DIAGNOSTIC STUDIES: Oxygen Saturation is 99% on RA, normal by my interpretation.    COORDINATION OF CARE: 2:42 PM Discussed treatment plan with pt at bedside including a CT of the neck and face, blood work and pain medication. Pt agreed to plan.  Labs Review Labs Reviewed  CBC WITH DIFFERENTIAL/PLATELET - Abnormal; Notable  for the following:    RBC 5.52 (*)    Hemoglobin 16.4 (*)    HCT 48.5 (*)    All other components within normal limits  BASIC METABOLIC PANEL - Abnormal; Notable for the following:    Glucose, Bld 317 (*)    All other components within normal limits  CBG MONITORING, ED - Abnormal; Notable for the following:    Glucose-Capillary 334 (*)    All other components within normal limits  CBG MONITORING, ED - Abnormal; Notable for the following:    Glucose-Capillary 295 (*)    All other components within normal limits  CBG MONITORING, ED - Abnormal; Notable for the following:    Glucose-Capillary 243 (*)    All other components within normal limits    Imaging Review Ct Soft Tissue Neck W Contrast  05/29/2015   CLINICAL DATA:  Left lower dental pain. Nausea and chills. Left lower jaw pain extending into the neck.  EXAM: CT  NECK WITH CONTRAST  TECHNIQUE: Multidetector CT imaging of the neck was performed using the standard protocol following the bolus administration of intravenous contrast.  CONTRAST:  184mL OMNIPAQUE IOHEXOL 300 MG/ML  SOLN  COMPARISON:  None.  FINDINGS: Aside from an unerupted left maxillary molar, the maxilla is edentulous.  A variety of the mandibular teeth are missing. Of the remaining teeth, there is a very large cavity in the more anterior remaining molar (probably tooth 19) with periapical lucency along the tooth roots. In addition, there is an abscess along the anterior margin of the left maxilla measuring 2.1 by 0.3 by 0.7 cm along the outer maxillary margin, and also with a focal outpouching adjacent to the dental cavity with enhancing margins measuring approximately 0.5 by 0.8 by 1.4 cm on image 25 series 3. There is considerable surrounding soft tissue phlegmon extending along the left face and chin adjacent to the maxilla. Reactive regional lymph nodes are present including a 1.1 cm submandibular lymph node on image 36 of series 3.  4 mm hypodense nodule in the right thyroid  gland, likely incidental.  Disc osteophyte complex that C5-6 causing borderline central narrowing of the thecal sac.  There is a distal cavity in tooth 29 which also has a periapical lucency.  IMPRESSION: 1. Periapical abscess of tooth # 19 associated with the large cavity and an odontogenic abscess tracking along the left maxilla, with a finger-like projection laterally and most of the abscess tracking directly along the outer maxillary margin. Extensive surrounding phlegmon and reactive adenopathy noted. 2. Other dental cavities are present, including a distal cavity in tooth 29 with a periapical lucency. 3. Disc osteophyte complex at C5-6. 4. The maxilla is edentulous aside from an unerupted left maxillary molar.   Electronically Signed   By: Van Clines M.D.   On: 05/29/2015 17:33     EKG Interpretation None      Meds given in ED:  Medications  clindamycin (CLEOCIN) IVPB 600 mg (0 mg Intravenous Stopped 05/29/15 1656)  sodium chloride 0.9 % bolus 500 mL (0 mLs Intravenous Stopped 05/29/15 1656)  oxyCODONE-acetaminophen (PERCOCET/ROXICET) 5-325 MG per tablet 1 tablet (1 tablet Oral Given 05/29/15 1546)  iohexol (OMNIPAQUE) 300 MG/ML solution 100 mL (100 mLs Intravenous Contrast Given 05/29/15 1658)  sodium chloride 0.9 % bolus 500 mL (0 mLs Intravenous Stopped 05/29/15 1828)    Discharge Medication List as of 05/29/2015  5:56 PM    START taking these medications   Details  clindamycin (CLEOCIN) 300 MG capsule Take 1 capsule (300 mg total) by mouth 4 (four) times daily. X 7 days, Starting 05/29/2015, Until Discontinued, Print    HYDROcodone-acetaminophen (NORCO/VICODIN) 5-325 MG per tablet Take 1-2 tablets by mouth every 6 (six) hours as needed., Starting 05/29/2015, Until Discontinued, Print    metFORMIN (GLUCOPHAGE) 500 MG tablet Take 1 tablet (500 mg total) by mouth 2 (two) times daily with a meal., Starting 05/29/2015, Until Discontinued, Print          MDM   Final diagnoses:   Hyperglycemia  Dental abscess   Patient presenting with one-day history of left facial swelling with cellulitis with no trismus, fevers or uvula deviation. Patient also diabetic with hyperglycemia and does not take any medications. Patient tolerating her secretions with ease. No drooling. No tongue or under tongue swelling. Patient with facial swelling but no drainable abscess. CT scan with contrast ordered to rule out spread of infection. Patient started on IV clindamycin.   CT without evidence of  abscess tracking along left maxilla. Stressed the importance of follow-up with patient's dentist. She stated he had an emergency today and was reason she could not see him. She will call tomorrow for an appointment. Patient also with chronic medical problems occluding high blood pressure and diabetes and does not take medications can she does not have a primary care provider. Case manager spoke to the wellness center to she can be seen there. She was started on metformin. Patient's hyperglycemia improved in the ED with 1 L of fluid. Patient denies headache, visual changes, chest pain, shortness of breath or hematuria. No evidence of hypertensive urgency. Patient is to follow-up PCP for hypertension management. Patient well-appearing and stable in no acute distress. She is tolerating fluids in the ED without difficulty. Patient is stable for outpatient management. Stressed the importance of following up with the dentist as soon as possible next 1-2 days.  Discussed return precautions with patient. Discussed all results and patient verbalizes understanding and agrees with plan.  I personally performed the services described in this documentation, which was scribed in my presence. The recorded information has been reviewed and is accurate.  Case has been discussed with Dr. Zenia Resides who agrees with the above plan and to discharge.    Filed Vitals:   05/29/15 1250 05/29/15 1605 05/29/15 1828  BP: 186/104 183/84  199/102  Pulse: 104 93 85  Temp: 98.4 F (36.9 C)    TempSrc: Oral    Resp: 18 18 20   SpO2: 99% 100% 99%     Al Corpus, PA-C 05/29/15 1903  Lacretia Leigh, MD 05/29/15 2336

## 2015-05-29 NOTE — ED Notes (Addendum)
Pt has chipped tooth to lower left mouth, pt has swelling to lower side and c/o pain

## 2015-05-30 ENCOUNTER — Telehealth: Payer: Self-pay

## 2015-05-30 NOTE — Telephone Encounter (Signed)
This CM spoke to patient and explained to her the services provided at the [facility], including pharmacy assistance, financial counseling, and social services. She said that she needs a PCP and is interested in establishing care at [facility].Marland Kitchen She noted that she is taking the antibiotics for her abscess and does not want to schedule an appointment at this time.  She has the contact # for the clinic # [phone] and said that she would think about it and call back in a few days.

## 2015-05-30 NOTE — Telephone Encounter (Signed)
Called the patient re. Establishing care w/ a PCP at the request of Amy Ferrero, CM. The preferred # [phone] was called, there was no answer and the message stated that a voice mailbox was not set up.

## 2015-07-29 ENCOUNTER — Encounter (HOSPITAL_COMMUNITY): Payer: Self-pay | Admitting: Emergency Medicine

## 2015-07-29 ENCOUNTER — Emergency Department (HOSPITAL_COMMUNITY)
Admission: EM | Admit: 2015-07-29 | Discharge: 2015-07-29 | Disposition: A | Discharge status: Home / Self Care | Payer: Self-pay | Attending: Emergency Medicine | Admitting: Emergency Medicine

## 2015-07-29 DIAGNOSIS — Z955 Presence of coronary angioplasty implant and graft: Secondary | ICD-10-CM | POA: Insufficient documentation

## 2015-07-29 DIAGNOSIS — E119 Type 2 diabetes mellitus without complications: Secondary | ICD-10-CM | POA: Insufficient documentation

## 2015-07-29 DIAGNOSIS — Z3202 Encounter for pregnancy test, result negative: Secondary | ICD-10-CM | POA: Insufficient documentation

## 2015-07-29 DIAGNOSIS — Z7982 Long term (current) use of aspirin: Secondary | ICD-10-CM | POA: Insufficient documentation

## 2015-07-29 DIAGNOSIS — Z87891 Personal history of nicotine dependence: Secondary | ICD-10-CM | POA: Insufficient documentation

## 2015-07-29 DIAGNOSIS — M545 Low back pain, unspecified: Secondary | ICD-10-CM

## 2015-07-29 DIAGNOSIS — Z794 Long term (current) use of insulin: Secondary | ICD-10-CM | POA: Insufficient documentation

## 2015-07-29 DIAGNOSIS — I252 Old myocardial infarction: Secondary | ICD-10-CM | POA: Insufficient documentation

## 2015-07-29 DIAGNOSIS — Z8719 Personal history of other diseases of the digestive system: Secondary | ICD-10-CM | POA: Insufficient documentation

## 2015-07-29 DIAGNOSIS — I2510 Atherosclerotic heart disease of native coronary artery without angina pectoris: Secondary | ICD-10-CM | POA: Insufficient documentation

## 2015-07-29 DIAGNOSIS — E785 Hyperlipidemia, unspecified: Secondary | ICD-10-CM | POA: Insufficient documentation

## 2015-07-29 DIAGNOSIS — E669 Obesity, unspecified: Secondary | ICD-10-CM | POA: Insufficient documentation

## 2015-07-29 LAB — URINALYSIS, ROUTINE W REFLEX MICROSCOPIC
BILIRUBIN URINE: NEGATIVE
HGB URINE DIPSTICK: NEGATIVE
Ketones, ur: NEGATIVE mg/dL
LEUKOCYTES UA: NEGATIVE
Nitrite: NEGATIVE
Protein, ur: NEGATIVE mg/dL
Specific Gravity, Urine: 1.03 (ref 1.005–1.030)
UROBILINOGEN UA: 0.2 mg/dL (ref 0.0–1.0)
pH: 6.5 (ref 5.0–8.0)

## 2015-07-29 LAB — POC URINE PREG, ED: Preg Test, Ur: NEGATIVE

## 2015-07-29 LAB — URINE MICROSCOPIC-ADD ON

## 2015-07-29 MED ORDER — KETOROLAC TROMETHAMINE 60 MG/2ML IM SOLN
60.0000 mg | Freq: Once | INTRAMUSCULAR | Status: AC
  Administered 2015-07-29: 60 mg via INTRAMUSCULAR
  Filled 2015-07-29: qty 2

## 2015-07-29 MED ORDER — IBUPROFEN 600 MG PO TABS: 600.0000 mg | ORAL_TABLET | Freq: Four times a day (QID) | ORAL | Status: DC | PRN

## 2015-07-29 NOTE — ED Provider Notes (Signed)
CSN: [id number]     Arrival date & time 07/29/15  1649 History   First MD Initiated Contact with Patient 07/29/15 1704     Chief Complaint  Patient presents with  . Back Pain     (Consider location/radiation/quality/duration/timing/severity/associated sxs/prior Treatment) HPI [name] is a 59 y.o. female history of diabetes, comes in for evaluation of back pain. Patient states she believes her symptoms may be due to "gas pains". She reports onset of her low back pain approximately one half weeks ago when she was trying to pull her foot up to the bed in order to put her shoes on. She reports associated cramping soreness in her lower back that will sometimes radiate into her left lower thigh. She reports her symptoms are often improved with Tylenol and muscle relaxers. Symptoms are exacerbated with walking and certain movements. Report when she is lying down flat or sitting still she really doesn't feel any discomfort. She denies any fevers, chills, numbness or weakness, loss of bowel or bladder function, difficulties walking, IV drug use, personal history of cancer. Of note, patient reports that she has not taken her insulin for the past several months due to financial difficulty in being able to afford her insulin.  Past Medical History  Diagnosis Date  . Diabetes mellitus without complication   . Heart attack   . GERD (gastroesophageal reflux disease)   . Hyperlipidemia LDL goal < 70 06/07/2013  . Coronary atherosclerosis of native coronary artery, with stent to LCX in 2006 06/04/2013  . S/P coronary artery stent placement to RCA with PROMUS DES, 06/07/13, residual disease on OM1, OM2 and rPDA 06/07/2013   Past Surgical History  Procedure Laterality Date  . Coronary stent placement  approx 2005    1 stent  . Left heart catheterization with coronary angiogram N/A 06/07/2013    Procedure: LEFT HEART CATHETERIZATION WITH CORONARY ANGIOGRAM;  Surgeon: Sanda Klein, MD;  Location: [facility]  [facility];  Service: Cardiovascular;  Laterality: N/A;   No family history on file. Social History  Substance Use Topics  . Smoking status: Former Smoker -- 25 years    Quit date: 07/03/2004  . Smokeless tobacco: Never Used  . Alcohol Use: Yes     Comment: ocassionally    OB History    No data available     Review of Systems A 10 point review of systems was completed and was negative except for pertinent positives and negatives as mentioned in the history of present illness     Allergies  Review of patient's allergies indicates no known allergies.  Home Medications   Prior to Admission medications   Medication Sig Start Date End Date Taking? Authorizing Provider  acetaminophen (TYLENOL) 500 MG tablet Take 1,000 mg by mouth every 6 (six) hours as needed for pain.    Yes Historical Provider, MD  nitroGLYCERIN (NITROSTAT) 0.4 MG SL tablet Place 1 tablet (0.4 mg total) under the tongue every 5 (five) minutes as needed for chest pain. 06/09/13  Yes Brittainy Erie Noe, PA-C  amLODipine (NORVASC) 10 MG tablet Take 1 tablet (10 mg total) by mouth daily. Patient not taking: Reported on 07/29/2015 06/09/13   Brittainy M Rosita Fire, PA-C  aspirin EC 81 MG EC tablet Take 1 tablet (81 mg total) by mouth daily. Patient not taking: Reported on 07/29/2015 06/09/13   Brittainy M Rosita Fire, PA-C  atorvastatin (LIPITOR) 80 MG tablet Take 1 tablet (80 mg total) by mouth daily at 6 PM. Patient not taking:  Reported on 07/29/2015 06/09/13   Brittainy M Simmons, PA-C  clindamycin (CLEOCIN) 300 MG capsule Take 1 capsule (300 mg total) by mouth 4 (four) times daily. X 7 days Patient not taking: Reported on 07/29/2015 05/29/15   Al Corpus, PA-C  EFFIENT 10 MG TABS tablet TAKE ONE TABLET BY MOUTH ONCE DAILY Patient not taking: Reported on 07/29/2015 08/12/13   Brett Canales, PA-C  HYDROcodone-acetaminophen (NORCO/VICODIN) 5-325 MG per tablet Take 1-2 tablets by mouth every 6 (six) hours as needed. Patient not taking:  Reported on 07/29/2015 05/29/15   Al Corpus, PA-C  ibuprofen (ADVIL,MOTRIN) 600 MG tablet Take 1 tablet (600 mg total) by mouth every 6 (six) hours as needed. 07/29/15   Comer Locket, PA-C  insulin NPH (HUMULIN N,NOVOLIN N) 100 UNIT/ML injection Inject 23 Units into the skin 2 (two) times daily before a meal. Patient not taking: Reported on 07/29/2015 06/09/13   Brittainy M Simmons, PA-C  lisinopril (PRINIVIL,ZESTRIL) 20 MG tablet Take 1 tablet (20 mg total) by mouth daily. Patient not taking: Reported on 07/29/2015 06/09/13   Brittainy Erie Noe, PA-C  metFORMIN (GLUCOPHAGE) 500 MG tablet Take 1 tablet (500 mg total) by mouth 2 (two) times daily with a meal. Patient not taking: Reported on 07/29/2015 05/29/15   Al Corpus, PA-C  metoprolol (LOPRESSOR) 50 MG tablet Take 1 tablet (50 mg total) by mouth 2 (two) times daily. Patient not taking: Reported on 07/29/2015 06/09/13   Brittainy M Simmons, PA-C   BP 164/96 mmHg  Pulse 70  Temp(Src) 98.3 F (36.8 C) (Oral)  Resp 19  SpO2 98% Physical Exam  Constitutional: She is oriented to person, place, and time. She appears well-developed and well-nourished.  Obese  HENT:  Head: Normocephalic and atraumatic.  Mouth/Throat: Oropharynx is clear and moist.  Eyes: Conjunctivae are normal. Pupils are equal, round, and reactive to light. Right eye exhibits no discharge. Left eye exhibits no discharge. No scleral icterus.  Neck: Neck supple.  Cardiovascular: Normal rate, regular rhythm and normal heart sounds.   Distal pulses equal and 2+ in upper and lower extremities.  Pulmonary/Chest: Effort normal and breath sounds normal. No respiratory distress. She has no wheezes. She has no rales.  Abdominal: Soft. There is no tenderness.  Musculoskeletal: She exhibits no tenderness.  No overt swelling or tenderness along the venous system and bilateral lower extremities. Negative straight leg raise bilaterally. Full active range of motion. Tenderness  diffusely throughout the paraspinal lumbar region over bilateral SI joints. No focal bony tenderness.  Neurological: She is alert and oriented to person, place, and time.  Cranial Nerves II-XII grossly intact. Moves all extremities without ataxia. Motor and sensation 5/5 in all 4 extremities. Gait is baseline without ataxia. Negative straight leg raise bilaterally  Skin: Skin is warm and dry. No rash noted.  Psychiatric: She has a normal mood and affect.  Nursing note and vitals reviewed.   ED Course  Procedures (including critical care time) Labs Review Labs Reviewed  URINALYSIS, ROUTINE W REFLEX MICROSCOPIC (NOT AT [facility]) - Abnormal; Notable for the following:    Glucose, UA >1000 (*)    All other components within normal limits  URINE MICROSCOPIC-ADD ON  POC URINE PREG, ED    Imaging Review No results found. I, Verl Dicker, personally reviewed and evaluated these images and lab results as part of my medical decision-making.   EKG Interpretation None     Meds given in ED:  Medications  ketorolac (TORADOL) injection 60 mg (60  mg Intramuscular Given 07/29/15 2037)    Discharge Medication List as of 07/29/2015  8:54 PM    START taking these medications   Details  ibuprofen (ADVIL,MOTRIN) 600 MG tablet Take 1 tablet (600 mg total) by mouth every 6 (six) hours as needed., Starting 07/29/2015, Until Discontinued, Print       Filed Vitals:   07/29/15 1656 07/29/15 2033 07/29/15 2113  BP: 164/113 193/81 164/96  Pulse: 76 69 70  Temp: 97.9 F (36.6 C) 98.3 F (36.8 C)   TempSrc: Oral Oral   Resp: 16 19   SpO2: 100% 97% 98%    MDM  Vitals stable -afebrile Pt resting comfortably in ED. PE--physical exam consistent with musculoskeletal injury. Will treat with anti-inflammatory's. Low suspicion for vascular compromise, other emergent intra-abdominal pathology. Labwork--no evidence of UTI on urinalysis. Patient does have greater than 1000 glucose. Denies any  abdominal pain, difficulties breathing. Low suspicion for any glycemic emergencies. Discussed insulin prescription with nurse case management, reports that patient has had appointments with [facility] but has not been to these appointments. I discussed with patient that she would need to follow-up with [facility] and they would be able to provide her insulin prescriptions that would be affordable. They will also need to follow-up on her high blood pressure. Patient agrees with this plan.  I discussed all relevant lab findings and imaging results with pt and they verbalized understanding. Discussed f/u with PCP within 48 hrs and return precautions, pt very amenable to plan.  Final diagnoses:  Bilateral low back pain without sciatica        Comer Locket, PA-C 07/30/15 [place], MD 07/30/15 [phone]

## 2015-07-29 NOTE — Care Management Note (Signed)
Case Management Note  Patient Details  Name: [name] MRN: [id number] Date of Birth: 19-Apr-1956  Subjective/Objective:      60 y.o. F seen in the ED reported to provider that she has not taken Insulin in "months" because she cannot afford it. Pt was seen by CM at [facility] and referred To [facility] 05/2015.                Action/Plan:Made provider aware that pt has been referred to CM at [facility] who has been calling and practically begging her to make an appaointment since 05/2015. Pt has been procrastinating. Suezanne Jacquet will encourage pt to make appt so that her meds can be filled at the clinic and she can avoid the frequent visits to the ED.    Expected Discharge Date:                  Expected Discharge Plan:     In-House Referral:     Discharge planning Services     Post Acute Care Choice:    Choice offered to:     DME Arranged:    DME Agency:     HH Arranged:    [place] Agency:     Status of Service:     Medicare Important Message Given:    Date Medicare IM Given:    Medicare IM give by:    Date Additional Medicare IM Given:    Additional Medicare Important Message give by:     If discussed at [place] of Stay Meetings, dates discussed:    Additional Comments:  Delrae Sawyers, RN 07/29/2015, 7:17 PM

## 2015-07-29 NOTE — ED Notes (Signed)
Pt. reports that when she is lying still, the pain is not present.

## 2015-07-29 NOTE — ED Notes (Signed)
Patient here with complaint of lower back pain. "Thinks it may be gas".

## 2015-07-29 NOTE — Discharge Instructions (Signed)
There is not appear to be an emergent cause for your symptoms at this time. Your likely experiencing pain related to a lumbosacral strain. Please take your anti-inflammatory's as needed for moderate pain. It is also important for you to follow-up with [facility] for primary care and to receive medications and insulin for your medical problems.  Lumbosacral Strain Lumbosacral strain is a strain of any of the parts that make up your lumbosacral vertebrae. Your lumbosacral vertebrae are the bones that make up the lower third of your backbone. Your lumbosacral vertebrae are held together by muscles and tough, fibrous tissue (ligaments).  CAUSES  A sudden blow to your back can cause lumbosacral strain. Also, anything that causes an excessive stretch of the muscles in the low back can cause this strain. This is typically seen when people exert themselves strenuously, fall, lift heavy objects, bend, or crouch repeatedly. RISK FACTORS  Physically demanding work.  Participation in pushing or pulling sports or sports that require a sudden twist of the back (tennis, golf, baseball).  Weight lifting.  Excessive lower back curvature.  Forward-tilted pelvis.  Weak back or abdominal muscles or both.  Tight hamstrings. SIGNS AND SYMPTOMS  Lumbosacral strain may cause pain in the area of your injury or pain that moves (radiates) down your leg.  DIAGNOSIS Your health care provider can often diagnose lumbosacral strain through a physical exam. In some cases, you may need tests such as X-ray exams.  TREATMENT  Treatment for your lower back injury depends on many factors that your clinician will have to evaluate. However, most treatment will include the use of anti-inflammatory medicines. HOME CARE INSTRUCTIONS   Avoid hard physical activities (tennis, racquetball, waterskiing) if you are not in proper physical condition for it. This may aggravate or create problems.  If you have a back  problem, avoid sports requiring sudden body movements. Swimming and walking are generally safer activities.  Maintain good posture.  Maintain a healthy weight.  For acute conditions, you may put ice on the injured area.  Put ice in a plastic bag.  Place a towel between your skin and the bag.  Leave the ice on for 20 minutes, 2-3 times a day.  When the low back starts healing, stretching and strengthening exercises may be recommended. SEEK MEDICAL CARE IF:  Your back pain is getting worse.  You experience severe back pain not relieved with medicines. SEEK IMMEDIATE MEDICAL CARE IF:   You have numbness, tingling, weakness, or problems with the use of your arms or legs.  There is a change in bowel or bladder control.  You have increasing pain in any area of the body, including your belly (abdomen).  You notice shortness of breath, dizziness, or feel faint.  You feel sick to your stomach (nauseous), are throwing up (vomiting), or become sweaty.  You notice discoloration of your toes or legs, or your feet get very cold. MAKE SURE YOU:   Understand these instructions.  Will watch your condition.  Will get help right away if you are not doing well or get worse. Document Released: 09/11/2005 Document Revised: 12/07/2013 Document Reviewed: 07/21/2013 [facility] Patient Information 2015 , [place]. This information is not intended to replace advice given to you by your health care provider. Make sure you discuss any questions you have with your health care provider.

## 2015-08-18 ENCOUNTER — Emergency Department (HOSPITAL_COMMUNITY): Payer: Self-pay

## 2015-08-18 ENCOUNTER — Encounter (HOSPITAL_COMMUNITY): Payer: Self-pay | Admitting: *Deleted

## 2015-08-18 ENCOUNTER — Inpatient Hospital Stay (HOSPITAL_COMMUNITY)
Admission: EM | Admit: 2015-08-18 | Discharge: 2015-08-24 | DRG: 247 | Disposition: A | Discharge status: Home / Self Care | Payer: Self-pay | Attending: Cardiovascular Disease | Admitting: Cardiovascular Disease

## 2015-08-18 DIAGNOSIS — E1165 Type 2 diabetes mellitus with hyperglycemia: Secondary | ICD-10-CM | POA: Diagnosis present

## 2015-08-18 DIAGNOSIS — Z9114 Patient's other noncompliance with medication regimen: Secondary | ICD-10-CM | POA: Diagnosis present

## 2015-08-18 DIAGNOSIS — E785 Hyperlipidemia, unspecified: Secondary | ICD-10-CM | POA: Diagnosis present

## 2015-08-18 DIAGNOSIS — I10 Essential (primary) hypertension: Secondary | ICD-10-CM | POA: Diagnosis present

## 2015-08-18 DIAGNOSIS — Z91199 Patient's noncompliance with other medical treatment and regimen due to unspecified reason: Secondary | ICD-10-CM

## 2015-08-18 DIAGNOSIS — Z87891 Personal history of nicotine dependence: Secondary | ICD-10-CM

## 2015-08-18 DIAGNOSIS — Z9119 Patient's noncompliance with other medical treatment and regimen: Secondary | ICD-10-CM | POA: Diagnosis present

## 2015-08-18 DIAGNOSIS — I200 Unstable angina: Secondary | ICD-10-CM

## 2015-08-18 DIAGNOSIS — I214 Non-ST elevation (NSTEMI) myocardial infarction: Principal | ICD-10-CM | POA: Diagnosis present

## 2015-08-18 DIAGNOSIS — I25110 Atherosclerotic heart disease of native coronary artery with unstable angina pectoris: Secondary | ICD-10-CM | POA: Diagnosis present

## 2015-08-18 DIAGNOSIS — Z7982 Long term (current) use of aspirin: Secondary | ICD-10-CM

## 2015-08-18 DIAGNOSIS — I119 Hypertensive heart disease without heart failure: Secondary | ICD-10-CM | POA: Diagnosis present

## 2015-08-18 DIAGNOSIS — Z955 Presence of coronary angioplasty implant and graft: Secondary | ICD-10-CM

## 2015-08-18 DIAGNOSIS — Z6836 Body mass index (BMI) 36.0-36.9, adult: Secondary | ICD-10-CM

## 2015-08-18 DIAGNOSIS — Z599 Problem related to housing and economic circumstances, unspecified: Secondary | ICD-10-CM

## 2015-08-18 DIAGNOSIS — R739 Hyperglycemia, unspecified: Secondary | ICD-10-CM

## 2015-08-18 DIAGNOSIS — K219 Gastro-esophageal reflux disease without esophagitis: Secondary | ICD-10-CM | POA: Diagnosis present

## 2015-08-18 DIAGNOSIS — Z79899 Other long term (current) drug therapy: Secondary | ICD-10-CM

## 2015-08-18 DIAGNOSIS — E669 Obesity, unspecified: Secondary | ICD-10-CM | POA: Diagnosis present

## 2015-08-18 DIAGNOSIS — K760 Fatty (change of) liver, not elsewhere classified: Secondary | ICD-10-CM | POA: Diagnosis present

## 2015-08-18 DIAGNOSIS — Z9861 Coronary angioplasty status: Secondary | ICD-10-CM

## 2015-08-18 DIAGNOSIS — Z794 Long term (current) use of insulin: Secondary | ICD-10-CM

## 2015-08-18 DIAGNOSIS — I2510 Atherosclerotic heart disease of native coronary artery without angina pectoris: Secondary | ICD-10-CM | POA: Diagnosis present

## 2015-08-18 DIAGNOSIS — Z79891 Long term (current) use of opiate analgesic: Secondary | ICD-10-CM

## 2015-08-18 DIAGNOSIS — K5900 Constipation, unspecified: Secondary | ICD-10-CM | POA: Diagnosis not present

## 2015-08-18 HISTORY — DX: Essential (primary) hypertension: I10

## 2015-08-18 HISTORY — DX: Angina pectoris, unspecified: I20.9

## 2015-08-18 LAB — BASIC METABOLIC PANEL
Anion gap: 10 (ref 5–15)
BUN: 8 mg/dL (ref 6–20)
CHLORIDE: 100 mmol/L — AB (ref 101–111)
CO2: 25 mmol/L (ref 22–32)
CREATININE: 0.66 mg/dL (ref 0.44–1.00)
Calcium: 9.2 mg/dL (ref 8.9–10.3)
GFR calc Af Amer: >60 mL/min (ref >60)
GFR calc non Af Amer: >60 mL/min (ref >60)
GLUCOSE: 291 mg/dL — AB (ref 65–99)
POTASSIUM: 3.7 mmol/L (ref 3.5–5.1)
Sodium: 135 mmol/L (ref 135–145)

## 2015-08-18 LAB — HEPATIC FUNCTION PANEL
ALT: 21 U/L (ref 14–54)
AST: 25 U/L (ref 15–41)
Albumin: 3.7 g/dL (ref 3.5–5.0)
Alkaline Phosphatase: 105 U/L (ref 38–126)
BILIRUBIN TOTAL: 0.9 mg/dL (ref 0.3–1.2)
Total Protein: 7.5 g/dL (ref 6.5–8.1)

## 2015-08-18 LAB — GLUCOSE, CAPILLARY: Glucose-Capillary: 377 mg/dL — ABNORMAL HIGH (ref 65–99)

## 2015-08-18 LAB — TROPONIN I: TROPONIN I: 1.35 ng/mL — AB (ref <0.031)

## 2015-08-18 LAB — CBC
HEMATOCRIT: 46.8 % — AB (ref 36.0–46.0)
Hemoglobin: 16.4 g/dL — ABNORMAL HIGH (ref 12.0–15.0)
MCH: 30.9 pg (ref 26.0–34.0)
MCHC: 35 g/dL (ref 30.0–36.0)
MCV: 88.3 fL (ref 78.0–100.0)
PLATELETS: 252 10*3/uL (ref 150–400)
RBC: 5.3 MIL/uL — ABNORMAL HIGH (ref 3.87–5.11)
RDW: 12.8 % (ref 11.5–15.5)
WBC: 7.5 10*3/uL (ref 4.0–10.5)

## 2015-08-18 LAB — TSH: TSH: 0.592 u[IU]/mL (ref 0.350–4.500)

## 2015-08-18 LAB — I-STAT TROPONIN, ED: TROPONIN I, POC: 0.39 ng/mL — AB (ref 0.00–0.08)

## 2015-08-18 LAB — HEPARIN LEVEL (UNFRACTIONATED): HEPARIN UNFRACTIONATED: 0.1 [IU]/mL — AB (ref 0.30–0.70)

## 2015-08-18 LAB — LIPASE, BLOOD: Lipase: 22 U/L (ref 22–51)

## 2015-08-18 LAB — D-DIMER, QUANTITATIVE: D-Dimer, Quant: <0.27 ug/mL-FEU (ref 0.00–0.48)

## 2015-08-18 MED ORDER — LORAZEPAM 2 MG/ML IJ SOLN
0.5000 mg | Freq: Once | INTRAMUSCULAR | Status: AC
  Administered 2015-08-18: 0.5 mg via INTRAVENOUS
  Filled 2015-08-18: qty 1

## 2015-08-18 MED ORDER — HEPARIN (PORCINE) IN NACL 100-0.45 UNIT/ML-% IJ SOLN
1250.0000 [IU]/h | INTRAMUSCULAR | Status: DC
  Administered 2015-08-18: 1000 [IU]/h via INTRAVENOUS
  Administered 2015-08-19 – 2015-08-20 (×3): 1400 [IU]/h via INTRAVENOUS
  Administered 2015-08-21: 1250 [IU]/h via INTRAVENOUS
  Filled 2015-08-18 (×5): qty 250

## 2015-08-18 MED ORDER — INSULIN ASPART 100 UNIT/ML ~~LOC~~ SOLN
7.0000 [IU] | Freq: Once | SUBCUTANEOUS | Status: AC
  Administered 2015-08-18: 7 [IU] via SUBCUTANEOUS

## 2015-08-18 MED ORDER — NITROGLYCERIN 0.4 MG SL SUBL: 0.4000 mg | SUBLINGUAL_TABLET | SUBLINGUAL | Status: DC | PRN

## 2015-08-18 MED ORDER — INSULIN ASPART 100 UNIT/ML ~~LOC~~ SOLN: 0.0000 [IU] | Freq: Three times a day (TID) | SUBCUTANEOUS | Status: DC

## 2015-08-18 MED ORDER — HEPARIN BOLUS VIA INFUSION
2000.0000 [IU] | Freq: Once | INTRAVENOUS | Status: AC
  Administered 2015-08-18: 2000 [IU] via INTRAVENOUS
  Filled 2015-08-18: qty 2000

## 2015-08-18 MED ORDER — HYDRALAZINE HCL 20 MG/ML IJ SOLN
15.0000 mg | Freq: Once | INTRAMUSCULAR | Status: AC
  Administered 2015-08-18: 15 mg via INTRAVENOUS
  Filled 2015-08-18: qty 1

## 2015-08-18 MED ORDER — ASPIRIN 81 MG PO CHEW
324.0000 mg | CHEWABLE_TABLET | Freq: Once | ORAL | Status: AC
  Administered 2015-08-18: 324 mg via ORAL
  Filled 2015-08-18: qty 4

## 2015-08-18 MED ORDER — PANTOPRAZOLE SODIUM 40 MG PO TBEC
40.0000 mg | DELAYED_RELEASE_TABLET | Freq: Every day | ORAL | Status: DC
Start: 2015-08-18 — End: 2015-08-24
  Administered 2015-08-18 – 2015-08-24 (×7): 40 mg via ORAL
  Filled 2015-08-18 (×8): qty 1

## 2015-08-18 MED ORDER — MORPHINE SULFATE (PF) 2 MG/ML IV SOLN
2.0000 mg | Freq: Once | INTRAVENOUS | Status: AC
  Administered 2015-08-18: 2 mg via INTRAVENOUS
  Filled 2015-08-18: qty 1

## 2015-08-18 MED ORDER — ASPIRIN EC 81 MG PO TBEC
81.0000 mg | DELAYED_RELEASE_TABLET | Freq: Every day | ORAL | Status: DC
Start: 2015-08-19 — End: 2015-08-24
  Administered 2015-08-19 – 2015-08-24 (×5): 81 mg via ORAL
  Filled 2015-08-18 (×5): qty 1

## 2015-08-18 MED ORDER — INSULIN ASPART 100 UNIT/ML ~~LOC~~ SOLN
0.0000 [IU] | Freq: Every day | SUBCUTANEOUS | Status: DC
Start: 2015-08-18 — End: 2015-08-18

## 2015-08-18 MED ORDER — METOPROLOL TARTRATE 25 MG PO TABS
25.0000 mg | ORAL_TABLET | Freq: Two times a day (BID) | ORAL | Status: DC
  Administered 2015-08-18 – 2015-08-22 (×9): 25 mg via ORAL
  Filled 2015-08-18 (×9): qty 1

## 2015-08-18 MED ORDER — ONDANSETRON HCL 4 MG/2ML IJ SOLN: 4.0000 mg | Freq: Four times a day (QID) | INTRAMUSCULAR | Status: DC | PRN

## 2015-08-18 MED ORDER — ACETAMINOPHEN 325 MG PO TABS
650.0000 mg | ORAL_TABLET | ORAL | Status: DC | PRN
  Administered 2015-08-19 – 2015-08-23 (×3): 650 mg via ORAL
  Filled 2015-08-18 (×3): qty 2

## 2015-08-18 MED ORDER — LISINOPRIL 10 MG PO TABS
10.0000 mg | ORAL_TABLET | Freq: Every day | ORAL | Status: DC
  Administered 2015-08-18 – 2015-08-23 (×6): 10 mg via ORAL
  Filled 2015-08-18 (×6): qty 1

## 2015-08-18 MED ORDER — NITROGLYCERIN 0.4 MG SL SUBL
0.4000 mg | SUBLINGUAL_TABLET | SUBLINGUAL | Status: DC | PRN
  Administered 2015-08-18 – 2015-08-23 (×5): 0.4 mg via SUBLINGUAL
  Filled 2015-08-18 (×5): qty 1

## 2015-08-18 MED ORDER — HEPARIN BOLUS VIA INFUSION
4000.0000 [IU] | Freq: Once | INTRAVENOUS | Status: AC
  Administered 2015-08-18: 4000 [IU] via INTRAVENOUS
  Filled 2015-08-18: qty 4000

## 2015-08-18 MED ORDER — ATORVASTATIN CALCIUM 80 MG PO TABS
80.0000 mg | ORAL_TABLET | Freq: Every day | ORAL | Status: DC
  Administered 2015-08-19 – 2015-08-23 (×5): 80 mg via ORAL
  Filled 2015-08-18 (×5): qty 1

## 2015-08-18 MED ORDER — INSULIN ASPART 100 UNIT/ML ~~LOC~~ SOLN
0.0000 [IU] | Freq: Three times a day (TID) | SUBCUTANEOUS | Status: DC
  Administered 2015-08-19: 5 [IU] via SUBCUTANEOUS
  Administered 2015-08-19 (×2): 8 [IU] via SUBCUTANEOUS
  Administered 2015-08-20: 11 [IU] via SUBCUTANEOUS
  Administered 2015-08-20: 5 [IU] via SUBCUTANEOUS
  Administered 2015-08-20 – 2015-08-21 (×2): 8 [IU] via SUBCUTANEOUS
  Administered 2015-08-21: 5 [IU] via SUBCUTANEOUS
  Administered 2015-08-21 – 2015-08-22 (×3): 8 [IU] via SUBCUTANEOUS
  Administered 2015-08-23: 5 [IU] via SUBCUTANEOUS
  Administered 2015-08-23: 07:00:00 11 [IU] via SUBCUTANEOUS
  Administered 2015-08-23: 18:00:00 5 [IU] via SUBCUTANEOUS
  Administered 2015-08-24: 07:00:00 8 [IU] via SUBCUTANEOUS
  Administered 2015-08-24: 11 [IU] via SUBCUTANEOUS

## 2015-08-18 NOTE — ED Provider Notes (Signed)
CSN: [id number]     Arrival date & time 08/18/15  1221 History   First MD Initiated Contact with Patient 08/18/15 1239     Chief Complaint  Patient presents with  . Chest Pain  . Shortness of Breath     (Consider location/radiation/quality/duration/timing/severity/associated sxs/prior Treatment) HPI   59 year old female with history of coronary artery disease and diabetes who is not currently taking any of her medications presents today with several days of chest pain which she describes as pressure in her mid chest. She states that she cannot identify specific increasing and decreasing factors. She feels like it does get worse when she lays down. It worsened at about 3 AM last night. She did not take any medications. Pain is currently approximate 8 out of 10. Past Medical History  Diagnosis Date  . Diabetes mellitus without complication   . Heart attack   . GERD (gastroesophageal reflux disease)   . Hyperlipidemia LDL goal < 70 06/07/2013  . Coronary atherosclerosis of native coronary artery, with stent to LCX in 2006 06/04/2013  . S/P coronary artery stent placement to RCA with PROMUS DES, 06/07/13, residual disease on OM1, OM2 and rPDA 06/07/2013   Past Surgical History  Procedure Laterality Date  . Coronary stent placement  approx 2005    1 stent  . Left heart catheterization with coronary angiogram N/A 06/07/2013    Procedure: LEFT HEART CATHETERIZATION WITH CORONARY ANGIOGRAM;  Surgeon: Sanda Klein, MD;  Location: [facility];  Service: Cardiovascular;  Laterality: N/A;   History reviewed. No pertinent family history. Social History  Substance Use Topics  . Smoking status: Former Smoker -- 25 years    Quit date: 07/03/2004  . Smokeless tobacco: Never Used  . Alcohol Use: Yes     Comment: ocassionally    OB History    No data available     Review of Systems  All other systems reviewed and are negative.     Allergies  Review of patient's allergies indicates no known  allergies.  Home Medications   Prior to Admission medications   Medication Sig Start Date End Date Taking? Authorizing Provider  acetaminophen (TYLENOL) 500 MG tablet Take 1,000 mg by mouth every 6 (six) hours as needed for pain.     Historical Provider, MD  amLODipine (NORVASC) 10 MG tablet Take 1 tablet (10 mg total) by mouth daily. Patient not taking: Reported on 07/29/2015 06/09/13   Brittainy M Rosita Fire, PA-C  aspirin EC 81 MG EC tablet Take 1 tablet (81 mg total) by mouth daily. Patient not taking: Reported on 07/29/2015 06/09/13   Brittainy M Rosita Fire, PA-C  atorvastatin (LIPITOR) 80 MG tablet Take 1 tablet (80 mg total) by mouth daily at 6 PM. Patient not taking: Reported on 07/29/2015 06/09/13   Brittainy M Rosita Fire, PA-C  clindamycin (CLEOCIN) 300 MG capsule Take 1 capsule (300 mg total) by mouth 4 (four) times daily. X 7 days Patient not taking: Reported on 07/29/2015 05/29/15   Al Corpus, PA-C  EFFIENT 10 MG TABS tablet TAKE ONE TABLET BY MOUTH ONCE DAILY Patient not taking: Reported on 07/29/2015 08/12/13   Brett Canales, PA-C  HYDROcodone-acetaminophen (NORCO/VICODIN) 5-325 MG per tablet Take 1-2 tablets by mouth every 6 (six) hours as needed. Patient not taking: Reported on 07/29/2015 05/29/15   Al Corpus, PA-C  ibuprofen (ADVIL,MOTRIN) 600 MG tablet Take 1 tablet (600 mg total) by mouth every 6 (six) hours as needed. 07/29/15   Comer Locket, PA-C  insulin NPH (  HUMULIN N,NOVOLIN N) 100 UNIT/ML injection Inject 23 Units into the skin 2 (two) times daily before a meal. Patient not taking: Reported on 07/29/2015 06/09/13   Brittainy M Simmons, PA-C  lisinopril (PRINIVIL,ZESTRIL) 20 MG tablet Take 1 tablet (20 mg total) by mouth daily. Patient not taking: Reported on 07/29/2015 06/09/13   Brittainy Erie Noe, PA-C  metFORMIN (GLUCOPHAGE) 500 MG tablet Take 1 tablet (500 mg total) by mouth 2 (two) times daily with a meal. Patient not taking: Reported on 07/29/2015 05/29/15   Al Corpus, PA-C  metoprolol (LOPRESSOR) 50 MG tablet Take 1 tablet (50 mg total) by mouth 2 (two) times daily. Patient not taking: Reported on 07/29/2015 06/09/13   Brittainy M Rosita Fire, PA-C  nitroGLYCERIN (NITROSTAT) 0.4 MG SL tablet Place 1 tablet (0.4 mg total) under the tongue every 5 (five) minutes as needed for chest pain. 06/09/13   Brittainy M Simmons, PA-C   BP 181/80 mmHg  Pulse 69  Temp(Src) 98.5 F (36.9 C) (Oral)  Resp 18  Ht 5\' 2"  (1.575 m)  SpO2 95% Physical Exam  Constitutional: She is oriented to person, place, and time. She appears well-developed and well-nourished.  HENT:  Head: Normocephalic and atraumatic.  Right Ear: External ear normal.  Left Ear: External ear normal.  Nose: Nose normal.  Mouth/Throat: Oropharynx is clear and moist.  Eyes: Conjunctivae and EOM are normal. Pupils are equal, round, and reactive to light.  Neck: Normal range of motion. Neck supple. No JVD present. No tracheal deviation present. No thyromegaly present.  Cardiovascular: Normal rate, regular rhythm, normal heart sounds and intact distal pulses.   Pulmonary/Chest: Effort normal and breath sounds normal. She has no wheezes.  Abdominal: Soft. Bowel sounds are normal. She exhibits no mass. There is tenderness. There is no guarding.  Musculoskeletal: Normal range of motion.  Lymphadenopathy:    She has no cervical adenopathy.  Neurological: She is alert and oriented to person, place, and time. She has normal reflexes. No cranial nerve deficit or sensory deficit. Gait normal. GCS eye subscore is 4. GCS verbal subscore is 5. GCS motor subscore is 6.  Reflex Scores:      Bicep reflexes are 2+ on the right side and 2+ on the left side.      Patellar reflexes are 2+ on the right side and 2+ on the left side. Strength is normal and equal throughout. Cranial nerves grossly intact. Patient fluent. No gross ataxia and patient able to ambulate without difficulty.  Skin: Skin is warm and dry.   Psychiatric: She has a normal mood and affect. Her behavior is normal. Judgment and thought content normal.  Nursing note and vitals reviewed.   ED Course  Procedures (including critical care time) Labs Review Labs Reviewed  BASIC METABOLIC PANEL - Abnormal; Notable for the following:    Chloride 100 (*)    Glucose, Bld 291 (*)    All other components within normal limits  CBC - Abnormal; Notable for the following:    RBC 5.30 (*)    Hemoglobin 16.4 (*)    HCT 46.8 (*)    All other components within normal limits  HEPATIC FUNCTION PANEL - Abnormal; Notable for the following:    Bilirubin, Direct <0.1 (*)    All other components within normal limits  I-STAT TROPOININ, ED - Abnormal; Notable for the following:    Troponin i, poc 0.39 (*)    All other components within normal limits  D-DIMER, QUANTITATIVE (NOT AT [facility])  LIPASE, BLOOD    Imaging Review Dg Chest 2 View  08/18/2015   CLINICAL DATA:  Mid chest pain and nausea since this morning.  EXAM: CHEST  2 VIEW  COMPARISON:  06/03/2013  FINDINGS: Cardiomediastinal silhouette is normal. Mediastinal contours appear intact.  There is no evidence of focal airspace consolidation, pleural effusion or pneumothorax.  Osseous structures are without acute abnormality. 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 Soft tissues are grossly normal.  IMPRESSION: No active cardiopulmonary disease.   Electronically Signed   By: Fidela Salisbury M.D.   On: 08/18/2015 13:26   I have personally reviewed and evaluated these images and lab results as part of my medical decision-making.   EKG Interpretation   Date/Time:  Friday August 18 2015 12:24:27 EDT Ventricular Rate:  80 PR Interval:  132 QRS Duration: 80 QT Interval:  366 QTC Calculation: 422 R Axis:   90 Text Interpretation:  Normal sinus rhythm Rightward axis Borderline ECG No  significant change since last tracing Confirmed by Viola Placeres MD,  Andee Poles  ([id number]) on 08/18/2015 12:40:21 PM      MDM   Final diagnoses:  NSTEMI (non-ST elevated myocardial infarction)  Hyperglycemia   1-nstemi- Patient with chest pain and positive troponins. EKG is normal. She is having ongoing chest pain. Cardiology is consulted for her and STEMI. Aspirin, heparin, and nitroglycerin ordered.  2-Patient also with some upper abdominal pain. She is currently having an ultrasound done but will be brought back for medications here. 3-Hyperglycemia-patient has been noncompliant with medications is hyperglycemic here to 90 does not appear to have any signs of DKA. She'll receive IV fluids and glucose control. Abdominal pain ultrasound is pending.  Discussed with [facility] on for cardiology and they will see and evaluate.   Pattricia Boss, MD 08/18/15 1452

## 2015-08-18 NOTE — Progress Notes (Addendum)
ANTICOAGULATION CONSULT NOTE - Initial Consult  Pharmacy Consult for heparin Indication: chest pain/ACS  No Known Allergies  Patient Measurements: Height: 5\' 2"  (157.5 cm) IBW/kg (Calculated) : 50.1 Heparin Dosing Weight: 71 kg  Vital Signs: Temp: 98.5 F (36.9 C) (09/02 1231) Temp Source: Oral (09/02 1231) BP: 164/78 mmHg (09/02 1600) Pulse Rate: 69 (09/02 1600)  Labs:  Recent Labs  08/18/15 1301  HGB 16.4*  HCT 46.8*  PLT 252  CREATININE 0.66    CrCl cannot be calculated (Unknown ideal weight.).   Medical History: Past Medical History  Diagnosis Date  . Diabetes mellitus without complication   . Heart attack   . GERD (gastroesophageal reflux disease)   . Hyperlipidemia LDL goal < 70 06/07/2013  . Coronary atherosclerosis of native coronary artery, with stent to LCX in 2006 06/04/2013  . S/P coronary artery stent placement to RCA with PROMUS DES, 06/07/13, residual disease on OM1, OM2 and rPDA 06/07/2013    Medications:  See home med list - not on anticoagulation  Assessment: 59 year old woman to start on IV heparin for ACS.  Heparin 4000 units IV x 1 and drip at 1000 units/hr already started by EDP. Goal of Therapy:  Heparin level 0.3-0.7 units/ml Monitor platelets by anticoagulation protocol: Yes   Plan:  Continue heparin drip at 1000 units/hr.   Check heparin level in 6 hours.  Daily CBC and heparin level while on heparin   Candie Mile 08/18/2015,4:58 PM  ADDENDUM:  HL came back subtherapeutic at 0.1. No issues noted.  Plan: Give 2,000 units heparin BOLUS Increase heparin gtt 1250 units/hr Check 6 hr HL Monitor daily HL, CBC, s/s of bleed

## 2015-08-18 NOTE — H&P (Signed)
Patient ID: LANGLEY FLATLEY MRN: [id number], DOB/AGE: 1956/09/10   Admit date: 08/18/2015   Primary Physician: No PCP Per Patient Primary Cardiologist: Dr. Sallyanne Kuster  HPI:   [name] is a 59 y.o. female with a history of diabetes mellitus, coronary artery disease s/p DES to RCA and unknown sten to Lcx, hypertension, gastroesophageal reflux disease, and a past history of tobacco abuse (quit 2006), non compliant to medication and f/u  who came to [facility] ED 08/18/15 for 3 days history of chest pain.   She apparently had a myocardial infarction in [place] in 2006 with a stent placed to the circumflex artery. Most recent cath 06/07/2013 for unstable angina showed 95% stenosis of prox RCA s/p PTCA and DES. There is a residual rPDA lesion in a 1.75 - 2.0 mm vessel, as well as the OM1 & OM2 lesions. Last seen in clinic 7/214. She stopped taking all her medication approximately 1 year ago. She has started taking metformin for the past 2 weeks. This was prescribed by physician at [facility] ED at that she was seen for L facial swelling with cellulitis. Echo 05/2013 with LV Ef of 55-60%, no WM abnormality.   She presented today with several days of chest pain which she describes as dull achy in her mid chest/epigstric area. She states that she cannot identify specific increasing and decreasing factors. She initially think that this is GERD, and started taking zantac, but no improvement.  Last night she woke up with chest pain and some nausea. She also had radiation of pain to L arm, however unable to specify when. She came to ED for further evaluation. Recently noticed exertional SOB. This pain is somewhat different from previous cardiac pain.  In ED, her chest pain was 8/10 - currently subsided after morphine. Started on IV heparin. Given ASA 324mg . EKG showed NSR without acute abnormality. CXR normal. POC troponin was 0.39. Blood glucose of 291. LFT normal. Lipase normal. Abdominal US showed hepatic steatosis,  otherwise negative . UA without ketone or nitrite, however glucose of more than 1000. D-dimer negative.   The patient currently denies rthopnea, dizziness, PND, cough, congestion, abdominal pain, hematochezia, melena, lower extremity edema, claudication. She drinks alcohol approximately once a week.    Problem List  Past Medical History  Diagnosis Date  . Diabetes mellitus without complication   . Heart attack   . GERD (gastroesophageal reflux disease)   . Hyperlipidemia LDL goal < 70 06/07/2013  . Coronary atherosclerosis of native coronary artery, with stent to LCX in 2006 06/04/2013  . S/P coronary artery stent placement to RCA with PROMUS DES, 06/07/13, residual disease on OM1, OM2 and rPDA 06/07/2013    Past Surgical History  Procedure Laterality Date  . Coronary stent placement  approx 2005    1 stent  . Left heart catheterization with coronary angiogram N/A 06/07/2013    Procedure: LEFT HEART CATHETERIZATION WITH CORONARY ANGIOGRAM;  Surgeon: Sanda Klein, MD;  Location: [facility];  Service: Cardiovascular;  Laterality: N/A;     Allergies  No Known Allergies   Home Medications  Prior to Admission medications   Medication Sig Start Date End Date Taking? Authorizing Provider  acetaminophen (TYLENOL) 500 MG tablet Take 1,000 mg by mouth every 6 (six) hours as needed for pain.     Historical Provider, MD  amLODipine (NORVASC) 10 MG tablet Take 1 tablet (10 mg total) by mouth daily. Patient not taking: Reported on 07/29/2015 06/09/13   Brittainy  Erie Noe, PA-C  aspirin EC 81 MG EC tablet Take 1 tablet (81 mg total) by mouth daily. Patient not taking: Reported on 07/29/2015 06/09/13   Brittainy M Rosita Fire, PA-C  atorvastatin (LIPITOR) 80 MG tablet Take 1 tablet (80 mg total) by mouth daily at 6 PM. Patient not taking: Reported on 07/29/2015 06/09/13   Brittainy M Rosita Fire, PA-C  clindamycin (CLEOCIN) 300 MG capsule Take 1 capsule (300 mg total) by mouth 4 (four) times daily. X 7  days Patient not taking: Reported on 07/29/2015 05/29/15   Al Corpus, PA-C  EFFIENT 10 MG TABS tablet TAKE ONE TABLET BY MOUTH ONCE DAILY Patient not taking: Reported on 07/29/2015 08/12/13   Brett Canales, PA-C  HYDROcodone-acetaminophen (NORCO/VICODIN) 5-325 MG per tablet Take 1-2 tablets by mouth every 6 (six) hours as needed. Patient not taking: Reported on 07/29/2015 05/29/15   Al Corpus, PA-C  ibuprofen (ADVIL,MOTRIN) 600 MG tablet Take 1 tablet (600 mg total) by mouth every 6 (six) hours as needed. 07/29/15   Comer Locket, PA-C  insulin NPH (HUMULIN N,NOVOLIN N) 100 UNIT/ML injection Inject 23 Units into the skin 2 (two) times daily before a meal. Patient not taking: Reported on 07/29/2015 06/09/13   Brittainy M Simmons, PA-C  lisinopril (PRINIVIL,ZESTRIL) 20 MG tablet Take 1 tablet (20 mg total) by mouth daily. Patient not taking: Reported on 07/29/2015 06/09/13   Brittainy Erie Noe, PA-C  metFORMIN (GLUCOPHAGE) 500 MG tablet Take 1 tablet (500 mg total) by mouth 2 (two) times daily with a meal. Patient not taking: Reported on 07/29/2015 05/29/15   Al Corpus, PA-C  metoprolol (LOPRESSOR) 50 MG tablet Take 1 tablet (50 mg total) by mouth 2 (two) times daily. Patient not taking: Reported on 07/29/2015 06/09/13   Brittainy M Rosita Fire, PA-C  nitroGLYCERIN (NITROSTAT) 0.4 MG SL tablet Place 1 tablet (0.4 mg total) under the tongue every 5 (five) minutes as needed for chest pain. 06/09/13   Brittainy Erie Noe, PA-C    Family History  History reviewed. No pertinent family history. No family status information on file.     Social History  Social History   Social History  . Marital Status: Divorced    Spouse Name: N/A  . Number of Children: N/A  . Years of Education: N/A   Occupational History  . Not on file.   Social History Main Topics  . Smoking status: Former Smoker -- 25 years    Quit date: 07/03/2004  . Smokeless tobacco: Never Used  . Alcohol Use: Yes      Comment: ocassionally   . Drug Use: No  . Sexual Activity: Not on file   Other Topics Concern  . Not on file   Social History Narrative       Physical Exam  Blood pressure 180/81, pulse 71, temperature 98.5 F (36.9 C), temperature source Oral, resp. rate 18, height 5\' 2"  (1.575 m), SpO2 99 %.  General: Pleasant, NAD Psych: Normal affect. Neuro: Alert and oriented X 3. Moves all extremities spontaneously. HEENT: Normal  Neck: Supple without bruits. JVD She has prominent visible pulsation of left neck.  Lungs:  Resp regular and unlabored, CTA. Heart: RRR no s3, s4, or murmurs. Abdomen: Soft, diffuse tenderness with palpation, non-distended, BS + x 4.  Extremities: No clubbing, cyanosis or edema. Diminished pulse of upper extremities. 2+ LE pulse.  Labs  No results for input(s): CKTOTAL, CKMB, TROPONINI in the last 72 hours. Lab Results  Component Value Date   WBC 7.5  08/18/2015   HGB 16.4* 08/18/2015   HCT 46.8* 08/18/2015   MCV 88.3 08/18/2015   PLT 252 08/18/2015    Recent Labs Lab 08/18/15 1301  NA 135  K 3.7  CL 100*  CO2 25  BUN 8  CREATININE 0.66  CALCIUM 9.2  PROT 7.5  BILITOT 0.9  ALKPHOS 105  ALT 21  AST 25  GLUCOSE 291*   Lab Results  Component Value Date   CHOL 199 06/03/2013   HDL 45 06/03/2013   LDLCALC 123* 06/03/2013   TRIG 155* 06/03/2013   Lab Results  Component Value Date   DDIMER <0.27 08/18/2015     Radiology/Studies  Dg Chest 2 View  08/18/2015   CLINICAL DATA:  Mid chest pain and nausea since this morning.  EXAM: CHEST  2 VIEW  COMPARISON:  06/03/2013  FINDINGS: Cardiomediastinal silhouette is normal. Mediastinal contours appear intact.  There is no evidence of focal airspace consolidation, pleural effusion or pneumothorax.  Osseous structures are without acute abnormality. 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 Soft tissues are grossly normal.  IMPRESSION: No active cardiopulmonary  disease.   Electronically Signed   By: Fidela Salisbury M.D.   On: 08/18/2015 13:26   US Abdomen Complete  08/18/2015   CLINICAL DATA:  59 year old female with epigastric pain and nausea for 4 days. Initial encounter. Diabetes.  EXAM: ULTRASOUND ABDOMEN COMPLETE  COMPARISON:  Body CT report 10/06/2003 (no images available).  FINDINGS: Gallbladder: No gallstones or wall thickening visualized. No sonographic Murphy sign noted.  Common bile duct: Diameter: 4 mm, normal  Liver: Mildly to moderately echogenic liver (image 36). No intrahepatic biliary ductal dilatation. No discrete liver lesion.  IVC: No abnormality visualized.  Pancreas: Incompletely visualized due to overlying bowel gas, visualized portions within normal limits.  Spleen: Size and appearance within normal limits.  Right Kidney: Length: 12.7 cm. Echogenicity within normal limits. No mass or hydronephrosis visualized.  Left Kidney: Length: 11.5 cm. Echogenicity within normal limits. No mass or hydronephrosis visualized.  Abdominal aorta: No aneurysm visualized.  Other findings: None.  IMPRESSION: Hepatic steatosis, otherwise negative abdomen ultrasound.   Electronically Signed   By: Genevie Ann M.D.   On: 08/18/2015 14:49   Cath 06/07/13 Impression:  Successful PCI of the proximal RCA lesion (95% reduced to 0% with TIMI 3 pre & post) -- Promus Premier DES 3. 0 mm x 20 mm.  Residual rPDA lesion in a 1.75 - 2.0 mm vessel, as well as the OM1 & OM2 lesions noted on diagnostic angiography.  Plan:  Angiomax x 2 hrs at reduced rate.  DAPT x 1 yr. CM consulted for Medication assistance.  Continue Aggressive medical therapy of BP, Lipids & DM-2 for residual disease.  Anticipate d/c as early as tomorrow.  The case and results was discussed with the patient (and family). The case and results was not discussed with the patient's PCP. The case and results was discussed with the patient's Cardiologist.  Echo 06/04/2013 LV EF: 55% -   60%  ------------------------------------------------------------ Indications:   Chest pain 786.51.  ------------------------------------------------------------ History:  PMH: CAD, Former Smoker, Unstable Angina Coronary artery disease. Risk factors: Diabetes mellitus.  ------------------------------------------------------------ Study Conclusions  Left ventricle: The cavity size was normal. Systolic function was normal. The estimated ejection fraction was in the range of 55% to 60%. Wall motion was normal; there were no regional wall motion abnormalities. Left ventricular diastolic function parameters were normal.  ECG  Vent. rate 80 BPM  PR interval 132 ms QRS duration 80 ms QT/QTc 366/422 ms P-R-T axes 68 90 50  ASSESSMENT AND PLAN   1. NSTEMI - POC Trop of 0.39. Chest pain concerning for cardiac etiology. Currently chest pain free after receiving morphine.  - She dose have hx of CAD s/p  RCA s/p PTCA and DES. There is a residual rPDA lesion in a 1.75 - 2.0 mm vessel, as well as the OM1 & OM2 lesions noted on diagnostic angiography 05/2013. She also had stent placement to Lcx in 2006.  - She also has visible pulsation of Left carotid with diminished bilateral radial pulse. DDx includes aortic disecssion, however no mediastinum wideness on CXR. Will get BP in both arm.  - Will get echo, if normal EF and no WM abnormality with no significant elevation in troponin, she will go home f/u as outpatient.  - EKG without acute abnormality. CXR normal.  - Admit and cycle troponin - Will get TSH, Lipid panel, HgbA1c, - continue ASA 81, heparin IV, lipitor 80mg , Lisinopril 10mg  and metoprolol 25mg  BID. Titrate medication further as needed. Stop Amlodipine and Effient.   2. Uncontrolled DM - Blood glucose of 291. LFT normal. Lipase normal. Abdominal US showed hepatic steatosis, otherwise negative . UA without ketone or nitrite, however glucose of more than 1000. No sign of  DKA. -Hold metformin. Will start on SSI.  3. HTN - Presented with BP of 191/105. Improved with pain control.   4. GERD - Will start on Protonix  5. HL - Continue statin  6. CAD - As above  7. Hepatic Steatosis - She drinks alcohol once/week. Incidental finding. - F/u as outpatient   Signed, Bhagat,Bhavinkumar, PA-C 08/18/2015, 3:31 PM Pager [phone]  I have seen and examined the patient along with Bhagat,Bhavinkumar, PA-C.  I have reviewed the chart, notes and new data.  I agree with PA's note.  Key new complaints: now asymptomatic after morphine, improved BP Key examination changes: no clinical signs of CHF, +ve S4, equal BP in R and L arms Key new findings / data: mild increase in POC troponin, normal ECG  PLAN: Suspect angina pectoris and mild troponin increase might be related to severely elevated BP in a patient with known CAD and secondary branch stenoses, rather than new acute coronary event involving a large epicardial artery.  Resume BP meds (as well as DM and lipid lowering agents). Check periodic troponin and echo. If there are no new wall motion abnormalities and there is no typical "rise and fall" pattern to the cardiac enzymes, continue with conservative management. If the cardiac enzyme increase is major or if new wall motion abnormalities are seen, will need repeat coronary angiography. Multiple coronary risk factors are poorly addressed. Ultimately, her prognosis is poor unless she can demonstrate compliance with medical follow up and prescriptions. She complains of difficulty obtaining follow up, but has missed scheduled appointments because "she forgot".  Sanda Klein, MD, [place] [phone] 08/18/2015, 4:39 PM

## 2015-08-18 NOTE — Progress Notes (Signed)
CRITICAL VALUE ALERT  Critical value received:  Troponin 1.35  Date of notification:  08/18/15  Time of notification:  2100  Critical value read back:Yes.    Nurse who received alert:  Garey Ham, RN  MD notified (1st page):  Dr. Eula Fried  Time of first page:  2104  MD notified (2nd page):  Time of second page:  Responding MD:  Dr. Eula Fried  Time MD responded:  2105

## 2015-08-18 NOTE — ED Notes (Signed)
Pt reports having generalized chest pain and sob with nausea x 3-4 days. Has cardiac hx. Initially felt like acid reflux. ekg done at triage.

## 2015-08-19 ENCOUNTER — Observation Stay (HOSPITAL_COMMUNITY): Payer: Self-pay

## 2015-08-19 DIAGNOSIS — R079 Chest pain, unspecified: Secondary | ICD-10-CM

## 2015-08-19 LAB — CBC
HEMATOCRIT: 44.1 % (ref 36.0–46.0)
HEMOGLOBIN: 15.2 g/dL — AB (ref 12.0–15.0)
MCH: 30.3 pg (ref 26.0–34.0)
MCHC: 34.5 g/dL (ref 30.0–36.0)
MCV: 88 fL (ref 78.0–100.0)
Platelets: 249 10*3/uL (ref 150–400)
RBC: 5.01 MIL/uL (ref 3.87–5.11)
RDW: 12.8 % (ref 11.5–15.5)
WBC: 9.5 10*3/uL (ref 4.0–10.5)

## 2015-08-19 LAB — TROPONIN I
TROPONIN I: 0.85 ng/mL — AB (ref <0.031)
TROPONIN I: 1.24 ng/mL — AB (ref <0.031)

## 2015-08-19 LAB — LIPID PANEL
CHOL/HDL RATIO: 4.4 ratio
CHOLESTEROL: 212 mg/dL — AB (ref 0–200)
HDL: 48 mg/dL (ref >40)
LDL Cholesterol: 144 mg/dL — ABNORMAL HIGH (ref 0–99)
Triglycerides: 100 mg/dL (ref <150)
VLDL: 20 mg/dL (ref 0–40)

## 2015-08-19 LAB — BASIC METABOLIC PANEL
ANION GAP: 10 (ref 5–15)
BUN: 12 mg/dL (ref 6–20)
CHLORIDE: 102 mmol/L (ref 101–111)
CO2: 26 mmol/L (ref 22–32)
Calcium: 9.1 mg/dL (ref 8.9–10.3)
Creatinine, Ser: 0.71 mg/dL (ref 0.44–1.00)
GFR calc non Af Amer: >60 mL/min (ref >60)
Glucose, Bld: 275 mg/dL — ABNORMAL HIGH (ref 65–99)
POTASSIUM: 3.3 mmol/L — AB (ref 3.5–5.1)
Sodium: 138 mmol/L (ref 135–145)

## 2015-08-19 LAB — HEMOGLOBIN A1C
Hgb A1c MFr Bld: 11.4 % — ABNORMAL HIGH (ref 4.8–5.6)
MEAN PLASMA GLUCOSE: 280 mg/dL

## 2015-08-19 LAB — HEPARIN LEVEL (UNFRACTIONATED)
HEPARIN UNFRACTIONATED: 0.36 [IU]/mL (ref 0.30–0.70)
Heparin Unfractionated: 0.25 IU/mL — ABNORMAL LOW (ref 0.30–0.70)
Heparin Unfractionated: 0.51 IU/mL (ref 0.30–0.70)

## 2015-08-19 LAB — GLUCOSE, CAPILLARY
GLUCOSE-CAPILLARY: 276 mg/dL — AB (ref 65–99)
GLUCOSE-CAPILLARY: 209 mg/dL — AB (ref 65–99)
Glucose-Capillary: 247 mg/dL — ABNORMAL HIGH (ref 65–99)
Glucose-Capillary: 256 mg/dL — ABNORMAL HIGH (ref 65–99)

## 2015-08-19 NOTE — Progress Notes (Signed)
ANTICOAGULATION CONSULT NOTE - Follow Up Consult  Pharmacy Consult for Heparin  Indication: chest pain/ACS  No Known Allergies  Patient Measurements: Height: 5\' 2"  (157.5 cm) Weight: 195 lb 12.8 oz (88.814 kg) IBW/kg (Calculated) : 50.1  Vital Signs: Temp: 98.7 F (37.1 C) (09/03 1331) Temp Source: Oral (09/03 1331) BP: 133/81 mmHg (09/03 1331) Pulse Rate: 95 (09/03 1331)  Labs:  Recent Labs  08/18/15 1301 08/18/15 1955 08/18/15 2152 08/19/15 0014 08/19/15 0503 08/19/15 0605 08/19/15 1321  HGB 16.4*  --   --  15.2*  --   --   --   HCT 46.8*  --   --  44.1  --   --   --   PLT 252  --   --  249  --   --   --   HEPARINUNFRC  --   --  0.10*  --  0.25*  --  0.36  CREATININE 0.66  --   --  0.71  --   --   --   TROPONINI  --  1.35*  --  1.24*  --  0.85*  --    Estimated Creatinine Clearance: 81.3 mL/min (by C-G formula based on Cr of 0.71).  Assessment: Heparin  for CP/NSTEMI. No anticoagulation prior to admission. Heparin therapeutic with level of 0.36.  Goal of Therapy:  Heparin level 0.3-0.7 units/ml Monitor platelets by anticoagulation protocol: Yes   Plan:  -Continue heparin 1400 units/hr -2000 HL -Daily CBC/HL -Monitor for bleeding   Melburn Popper, PharmD Clinical Pharmacy Resident Pager: [phone] 08/19/2015 2:31 PM

## 2015-08-19 NOTE — Progress Notes (Signed)
Initial Nutrition Assessment  DOCUMENTATION CODES:   Obesity unspecified  INTERVENTION:   No nutrition intervention at this time --- patient declined  NUTRITION DIAGNOSIS:   Inadequate oral intake related to poor appetite as evidenced by per patient/family report  GOAL:   Patient will meet greater than or equal to 90% of their needs  MONITOR:   PO intake, Labs, Weight trends, I & O's  REASON FOR ASSESSMENT:   Malnutrition Screening Tool  ASSESSMENT:   59 y.o. Female with a history of diabetes mellitus, coronary artery disease s/p DES to RCA and unknown sten to Lcx, hypertension, gastroesophageal reflux disease, and a past history of tobacco abuse (quit 2006), non compliant to medication and f/u who came to [facility] ED 08/18/15 for 3 days history of chest pain.   Pt reports a decreased appetite.  States she's "nibbling" on her food.  PO intake 25-50% per flowsheet records.  No recent weight loss.  RD offered oral nutrition supplements, however, pt declined.  Nutrition focused physical exam completed.  No muscle or subcutaneous fat depletion noticed.  Diet Order:  Diet Heart Room service appropriate?: Yes; Fluid consistency:: Thin  Skin:  Reviewed, no issues  Last BM:  9/2  Height:   Ht Readings from Last 1 Encounters:  08/18/15 5\' 2"  (1.575 m)    Weight:   Wt Readings from Last 1 Encounters:  08/19/15 195 lb 12.8 oz (88.814 kg)    Ideal Body Weight:  50 kg  BMI:  Body mass index is 35.8 kg/(m^2).  Estimated Nutritional Needs:   Kcal:  1800-2000  Protein:  90-100 gm  Fluid:  1.8-2.0 L  EDUCATION NEEDS:   No education needs identified at this time  [name], RD, LDN Pager #: [phone] After-Hours Pager #: [phone]

## 2015-08-19 NOTE — Progress Notes (Signed)
ANTICOAGULATION CONSULT NOTE - Follow Up Consult  Pharmacy Consult for Heparin  Indication: chest pain/ACS  No Known Allergies  Patient Measurements: Height: 5\' 2"  (157.5 cm) Weight: 195 lb 12.8 oz (88.814 kg) IBW/kg (Calculated) : 50.1  Vital Signs: Temp: 98.8 F (37.1 C) (09/03 0540) Temp Source: Oral (09/03 0540) BP: 159/102 mmHg (09/03 0540) Pulse Rate: 95 (09/03 0540)  Labs:  Recent Labs  08/18/15 1301 08/18/15 1955 08/18/15 2152 08/19/15 0014 08/19/15 0503  HGB 16.4*  --   --  15.2*  --   HCT 46.8*  --   --  44.1  --   PLT 252  --   --  249  --   HEPARINUNFRC  --   --  0.10*  --  0.25*  CREATININE 0.66  --   --  0.71  --   TROPONINI  --  1.35*  --  1.24*  --    Estimated Creatinine Clearance: 81.3 mL/min (by C-G formula based on Cr of 0.71).  Assessment: Sub-therapeutic heparin level despite rate increase, no issues per RN.   Goal of Therapy:  Heparin level 0.3-0.7 units/ml Monitor platelets by anticoagulation protocol: Yes   Plan:  -Increase heparin to 1400 units/hr -1300 HL -Daily CBC/HL -Monitor for bleeding  Narda Bonds 08/19/2015,6:33 AM

## 2015-08-19 NOTE — Progress Notes (Signed)
  Echocardiogram 2D Echocardiogram has been performed.  [name] 08/19/2015, 12:02 PM

## 2015-08-19 NOTE — Progress Notes (Signed)
Pt had episode of 8/10 midsternal CP and SOB.  EKG shows no significant changes.  VSS.  O2 applied via [place] at 2L/min.  CP relieved after 2 sublingual NTG.  Pt then reported feeling anxious and unable to "turn her mind off," also states she can't tolerate the nasal cannula.  O2 d/c'd per pt request.  Dr. Eula Fried notified of above.  Order received for Lorazepam and given per MD order.  Will continue to monitor.  Jodell Cipro

## 2015-08-19 NOTE — Progress Notes (Signed)
CSW order received to provide financial assistance for medication needs.  RNCM order received for same for follow up. CSW will sign off but available should any CSW needs be determined.  Lorie Phenix. Pauline Good, [place]   (weekend coverage)

## 2015-08-19 NOTE — Progress Notes (Signed)
Subjective: Pt denies Cp at present  Breathing is OK   Objective: Filed Vitals:   08/18/15 2310 08/19/15 0225 08/19/15 0540 08/19/15 0700  BP: 117/70 145/86 159/102 156/83  Pulse: 92  95   Temp:   98.8 F (37.1 C)   TempSrc:   Oral   Resp: 10 16 18 23   Height:      Weight:   195 lb 12.8 oz (88.814 kg)   SpO2: 96%  100%    Weight change:   Intake/Output Summary (Last 24 hours) at 08/19/15 0754 Last data filed at 08/18/15 2053  Gross per 24 hour  Intake    240 ml  Output      0 ml  Net    240 ml    General: Alert, awake, oriented x3, in no acute distress Neck:  JVP is normal Heart: Regular rate and rhythm, without murmurs, rubs, gallops.  Lungs: Clear to auscultation.  No rales or wheezes. Exemities:  No edema.   Neuro: Grossly intact, nonfocal.  Tele:  SR  Lab Results: Results for orders placed or performed during the hospital encounter of 08/18/15 (from the past 24 hour(s))  Basic metabolic panel     Status: Abnormal   Collection Time: 08/18/15  1:01 PM  Result Value Ref Range   Sodium 135 135 - 145 mmol/L   Potassium 3.7 3.5 - 5.1 mmol/L   Chloride 100 (L) 101 - 111 mmol/L   CO2 25 22 - 32 mmol/L   Glucose, Bld 291 (H) 65 - 99 mg/dL   BUN 8 6 - 20 mg/dL   Creatinine, Ser 0.66 0.44 - 1.00 mg/dL   Calcium 9.2 8.9 - 10.3 mg/dL   GFR calc non Af Amer >60 >60 mL/min   GFR calc Af Amer >60 >60 mL/min   Anion gap 10 5 - 15  CBC     Status: Abnormal   Collection Time: 08/18/15  1:01 PM  Result Value Ref Range   WBC 7.5 4.0 - 10.5 K/uL   RBC 5.30 (H) 3.87 - 5.11 MIL/uL   Hemoglobin 16.4 (H) 12.0 - 15.0 g/dL   HCT 46.8 (H) 36.0 - 46.0 %   MCV 88.3 78.0 - 100.0 fL   MCH 30.9 26.0 - 34.0 pg   MCHC 35.0 30.0 - 36.0 g/dL   RDW 12.8 11.5 - 15.5 %   Platelets 252 150 - 400 K/uL  Hepatic function panel     Status: Abnormal   Collection Time: 08/18/15  1:01 PM  Result Value Ref Range   Total Protein 7.5 6.5 - 8.1 g/dL   Albumin 3.7 3.5 - 5.0 g/dL   AST 25 15 - 41  U/L   ALT 21 14 - 54 U/L   Alkaline Phosphatase 105 38 - 126 U/L   Total Bilirubin 0.9 0.3 - 1.2 mg/dL   Bilirubin, Direct <0.1 (L) 0.1 - 0.5 mg/dL   Indirect Bilirubin NOT CALCULATED 0.3 - 0.9 mg/dL  D-dimer, quantitative (not at [facility])     Status: None   Collection Time: 08/18/15  1:01 PM  Result Value Ref Range   D-Dimer, Quant <0.27 0.00 - 0.48 ug/mL-FEU  Lipase, blood     Status: None   Collection Time: 08/18/15  1:01 PM  Result Value Ref Range   Lipase 22 22 - 51 U/L  I-stat troponin, ED     Status: Abnormal   Collection Time: 08/18/15  1:06 PM  Result Value Ref Range   Troponin i,  poc 0.39 (HH) 0.00 - 0.08 ng/mL   Comment NOTIFIED PHYSICIAN    Comment 3          TSH     Status: None   Collection Time: 08/18/15  7:55 PM  Result Value Ref Range   TSH 0.592 0.350 - 4.500 uIU/mL  Troponin I     Status: Abnormal   Collection Time: 08/18/15  7:55 PM  Result Value Ref Range   Troponin I 1.35 (HH) <0.031 ng/mL  Hemoglobin A1c     Status: Abnormal   Collection Time: 08/18/15  7:55 PM  Result Value Ref Range   Hgb A1c MFr Bld 11.4 (H) 4.8 - 5.6 %   Mean Plasma Glucose 280 mg/dL  Glucose, capillary     Status: Abnormal   Collection Time: 08/18/15  9:11 PM  Result Value Ref Range   Glucose-Capillary 377 (H) 65 - 99 mg/dL  Heparin level (unfractionated)     Status: Abnormal   Collection Time: 08/18/15  9:52 PM  Result Value Ref Range   Heparin Unfractionated 0.10 (L) 0.30 - 0.70 IU/mL  Troponin I     Status: Abnormal   Collection Time: 08/19/15 12:14 AM  Result Value Ref Range   Troponin I 1.24 (HH) <0.031 ng/mL  Basic metabolic panel     Status: Abnormal   Collection Time: 08/19/15 12:14 AM  Result Value Ref Range   Sodium 138 135 - 145 mmol/L   Potassium 3.3 (L) 3.5 - 5.1 mmol/L   Chloride 102 101 - 111 mmol/L   CO2 26 22 - 32 mmol/L   Glucose, Bld 275 (H) 65 - 99 mg/dL   BUN 12 6 - 20 mg/dL   Creatinine, Ser 0.71 0.44 - 1.00 mg/dL   Calcium 9.1 8.9 - 10.3 mg/dL    GFR calc non Af Amer >60 >60 mL/min   GFR calc Af Amer >60 >60 mL/min   Anion gap 10 5 - 15  CBC     Status: Abnormal   Collection Time: 08/19/15 12:14 AM  Result Value Ref Range   WBC 9.5 4.0 - 10.5 K/uL   RBC 5.01 3.87 - 5.11 MIL/uL   Hemoglobin 15.2 (H) 12.0 - 15.0 g/dL   HCT 44.1 36.0 - 46.0 %   MCV 88.0 78.0 - 100.0 fL   MCH 30.3 26.0 - 34.0 pg   MCHC 34.5 30.0 - 36.0 g/dL   RDW 12.8 11.5 - 15.5 %   Platelets 249 150 - 400 K/uL  Lipid panel     Status: Abnormal   Collection Time: 08/19/15 12:14 AM  Result Value Ref Range   Cholesterol 212 (H) 0 - 200 mg/dL   Triglycerides 100 <150 mg/dL   HDL 48 >40 mg/dL   Total CHOL/HDL Ratio 4.4 RATIO   VLDL 20 0 - 40 mg/dL   LDL Cholesterol 144 (H) 0 - 99 mg/dL  Heparin level (unfractionated)     Status: Abnormal   Collection Time: 08/19/15  5:03 AM  Result Value Ref Range   Heparin Unfractionated 0.25 (L) 0.30 - 0.70 IU/mL  Troponin I     Status: Abnormal   Collection Time: 08/19/15  6:05 AM  Result Value Ref Range   Troponin I 0.85 (HH) <0.031 ng/mL  Glucose, capillary     Status: Abnormal   Collection Time: 08/19/15  7:19 AM  Result Value Ref Range   Glucose-Capillary 276 (H) 65 - 99 mg/dL    Studies/Results: Dg Chest 2 View  08/18/2015  CLINICAL DATA:  Mid chest pain and nausea since this morning.  EXAM: CHEST  2 VIEW  COMPARISON:  06/03/2013  FINDINGS: Cardiomediastinal silhouette is normal. Mediastinal contours appear intact.  There is no evidence of focal airspace consolidation, pleural effusion or pneumothorax.  Osseous structures are without acute abnormality. 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 Soft tissues are grossly normal.  IMPRESSION: No active cardiopulmonary disease.   Electronically Signed   By: Fidela Salisbury M.D.   On: 08/18/2015 13:26   US Abdomen Complete  08/18/2015   CLINICAL DATA:  59 year old female with epigastric pain and nausea for 4 days. Initial  encounter. Diabetes.  EXAM: ULTRASOUND ABDOMEN COMPLETE  COMPARISON:  Body CT report 10/06/2003 (no images available).  FINDINGS: Gallbladder: No gallstones or wall thickening visualized. No sonographic Murphy sign noted.  Common bile duct: Diameter: 4 mm, normal  Liver: Mildly to moderately echogenic liver (image 36). No intrahepatic biliary ductal dilatation. No discrete liver lesion.  IVC: No abnormality visualized.  Pancreas: Incompletely visualized due to overlying bowel gas, visualized portions within normal limits.  Spleen: Size and appearance within normal limits.  Right Kidney: Length: 12.7 cm. Echogenicity within normal limits. No mass or hydronephrosis visualized.  Left Kidney: Length: 11.5 cm. Echogenicity within normal limits. No mass or hydronephrosis visualized.  Abdominal aorta: No aneurysm visualized.  Other findings: None.  IMPRESSION: Hepatic steatosis, otherwise negative abdomen ultrasound.   Electronically Signed   By: Genevie Ann M.D.   On: 08/18/2015 14:49    Medications:Reviewed  @PROBHOSP @  1  CP/Elevated trop  Mild bump with resolution.  Had a episode of pain early this am  Now gone  Points to mid/right chest   Continue heparin  Echo      2.  CAD s/p PCI/stent to RCA and Stent to LCx  3.  HTN  BP is improved on meds  Continue meds    4.  GERD 5.  Noncompliance      Dorris Carnes 08/19/2015, 7:54 AM

## 2015-08-19 NOTE — Progress Notes (Signed)
ANTICOAGULATION CONSULT NOTE - Follow Up Consult  Pharmacy Consult for Heparin  Indication: chest pain/ACS  No Known Allergies  Patient Measurements: Height: 5\' 2"  (157.5 cm) Weight: 195 lb 12.8 oz (88.814 kg) IBW/kg (Calculated) : 50.1  Vital Signs: Temp: 98.7 F (37.1 C) (09/03 1331) Temp Source: Oral (09/03 1331) BP: 133/81 mmHg (09/03 1331) Pulse Rate: 95 (09/03 1331)  Labs:  Recent Labs  08/18/15 1301 08/18/15 1955  08/19/15 0014 08/19/15 0503 08/19/15 0605 08/19/15 1321 08/19/15 1930  HGB 16.4*  --   --  15.2*  --   --   --   --   HCT 46.8*  --   --  44.1  --   --   --   --   PLT 252  --   --  249  --   --   --   --   HEPARINUNFRC  --   --   < >  --  0.25*  --  0.36 0.51  CREATININE 0.66  --   --  0.71  --   --   --   --   TROPONINI  --  1.35*  --  1.24*  --  0.85*  --   --   < > = values in this interval not displayed. Estimated Creatinine Clearance: 81.3 mL/min (by C-G formula based on Cr of 0.71).  Assessment: 59 y/o female on heparin for NSTEMI. Heparin level is therapeutic at 0.51 on 1400 units/hr. No bleeding noted.  Goal of Therapy:  Heparin level 0.3-0.7 units/ml Monitor platelets by anticoagulation protocol: Yes   Plan:  -Continue heparin drip at 1400 units/hr -Daily CBC/HL -Monitor for bleeding  [facility], Pharm.D., BCPS Clinical Pharmacist Pager: [phone] 08/19/2015 8:33 PM

## 2015-08-19 NOTE — Progress Notes (Signed)
Pt c/o sharp CP 7/10. SL nitro x1 given; EKG obtained and in chart which showed sinus tachycardia. Vital signs stable; pt's CP resolved after nitro was given. Will continue to monitor pt.

## 2015-08-20 LAB — CBC
HCT: 42.9 % (ref 36.0–46.0)
HEMOGLOBIN: 14.7 g/dL (ref 12.0–15.0)
MCH: 30.4 pg (ref 26.0–34.0)
MCHC: 34.3 g/dL (ref 30.0–36.0)
MCV: 88.6 fL (ref 78.0–100.0)
Platelets: 220 10*3/uL (ref 150–400)
RBC: 4.84 MIL/uL (ref 3.87–5.11)
RDW: 12.8 % (ref 11.5–15.5)
WBC: 6.5 10*3/uL (ref 4.0–10.5)

## 2015-08-20 LAB — GLUCOSE, CAPILLARY
GLUCOSE-CAPILLARY: 307 mg/dL — AB (ref 65–99)
Glucose-Capillary: 202 mg/dL — ABNORMAL HIGH (ref 65–99)
Glucose-Capillary: 288 mg/dL — ABNORMAL HIGH (ref 65–99)
Glucose-Capillary: 290 mg/dL — ABNORMAL HIGH (ref 65–99)

## 2015-08-20 LAB — HEPARIN LEVEL (UNFRACTIONATED): HEPARIN UNFRACTIONATED: 0.64 [IU]/mL (ref 0.30–0.70)

## 2015-08-20 MED ORDER — MORPHINE SULFATE (PF) 2 MG/ML IV SOLN
2.0000 mg | INTRAVENOUS | Status: DC | PRN
  Administered 2015-08-21 – 2015-08-22 (×2): 2 mg via INTRAVENOUS
  Administered 2015-08-23: 04:00:00 4 mg via INTRAVENOUS
  Administered 2015-08-23 (×2): 2 mg via INTRAVENOUS
  Filled 2015-08-20: qty 2
  Filled 2015-08-20 (×4): qty 1

## 2015-08-20 MED ORDER — NITROGLYCERIN 2 % TD OINT
1.0000 [in_us] | TOPICAL_OINTMENT | Freq: Four times a day (QID) | TRANSDERMAL | Status: DC
  Administered 2015-08-20 – 2015-08-21 (×4): 1 [in_us] via TOPICAL
  Filled 2015-08-20: qty 30

## 2015-08-20 NOTE — Progress Notes (Signed)
Subjective: Still with some intermitt CP  Breathig is OK   Objective: Filed Vitals:   08/19/15 1331 08/19/15 2100 08/19/15 2207 08/20/15 0500  BP: 133/81 130/73 158/90 142/79  Pulse: 95 104  77  Temp: 98.7 F (37.1 C) 98.6 F (37 C)  98.3 F (36.8 C)  TempSrc: Oral     Resp: 17 13  16   Height:      Weight:    195 lb 14.4 oz (88.86 kg)  SpO2: 98% 100%  98%   Weight change: -1 lb 1.6 oz (-0.499 kg)  Intake/Output Summary (Last 24 hours) at 08/20/15 0746 Last data filed at 08/20/15 0500  Gross per 24 hour  Intake   1250 ml  Output      0 ml  Net   1250 ml    General: Alert, awake, oriented x3, in no acute distress Neck:  JVP is normal Heart: Regular rate and rhythm, without murmurs, rubs, gallops.  Lungs: Clear to auscultation.  No rales or wheezes. Exemities:  No edema.   Neuro: Grossly intact, nonfocal.  Tele:  SR    Lab Results: Results for orders placed or performed during the hospital encounter of 08/18/15 (from the past 24 hour(s))  Glucose, capillary     Status: Abnormal   Collection Time: 08/19/15 11:03 AM  Result Value Ref Range   Glucose-Capillary 247 (H) 65 - 99 mg/dL  Heparin level (unfractionated)     Status: None   Collection Time: 08/19/15  1:21 PM  Result Value Ref Range   Heparin Unfractionated 0.36 0.30 - 0.70 IU/mL  Glucose, capillary     Status: Abnormal   Collection Time: 08/19/15  4:21 PM  Result Value Ref Range   Glucose-Capillary 256 (H) 65 - 99 mg/dL  Heparin level (unfractionated)     Status: None   Collection Time: 08/19/15  7:30 PM  Result Value Ref Range   Heparin Unfractionated 0.51 0.30 - 0.70 IU/mL  Glucose, capillary     Status: Abnormal   Collection Time: 08/19/15  8:53 PM  Result Value Ref Range   Glucose-Capillary 209 (H) 65 - 99 mg/dL  CBC     Status: None   Collection Time: 08/20/15  6:21 AM  Result Value Ref Range   WBC 6.5 4.0 - 10.5 K/uL   RBC 4.84 3.87 - 5.11 MIL/uL   Hemoglobin 14.7 12.0 - 15.0 g/dL   HCT 42.9  36.0 - 46.0 %   MCV 88.6 78.0 - 100.0 fL   MCH 30.4 26.0 - 34.0 pg   MCHC 34.3 30.0 - 36.0 g/dL   RDW 12.8 11.5 - 15.5 %   Platelets 220 150 - 400 K/uL  Heparin level (unfractionated)     Status: None   Collection Time: 08/20/15  6:21 AM  Result Value Ref Range   Heparin Unfractionated 0.64 0.30 - 0.70 IU/mL  Glucose, capillary     Status: Abnormal   Collection Time: 08/20/15  7:32 AM  Result Value Ref Range   Glucose-Capillary 307 (H) 65 - 99 mg/dL    Studies/Results: No results found.  Medications: REviewed   @PROBHOSP @   1 CP/Elevated trop  There was a sl rise and fall of trop  Echo showed normal LV systolic function  Still with intermitt pain despite heparin   I would reocmm L ehart cath to define anatomy  Can plan for Tuesday   Continue heparin for now.    2. CAD s/p PCI/stent to RCA and Stent to  LCx  See 1    3. HTN  Better control  4. GERD 5. Noncompliance     [name]   LOS: 1 day   [name] 08/20/2015, 7:46 AM

## 2015-08-20 NOTE — Progress Notes (Signed)
ANTICOAGULATION CONSULT NOTE - Follow Up Consult  Pharmacy Consult for Heparin  Indication: chest pain/ACS  No Known Allergies  Patient Measurements: Height: 5\' 2"  (157.5 cm) Weight: 195 lb 14.4 oz (88.86 kg) IBW/kg (Calculated) : 50.1  Vital Signs: Temp: 98.3 F (36.8 C) (09/04 0500) BP: 142/79 mmHg (09/04 0500) Pulse Rate: 77 (09/04 0500)  Labs:  Recent Labs  08/18/15 1301 08/18/15 1955  08/19/15 0014  08/19/15 0605 08/19/15 1321 08/19/15 1930 08/20/15 0621  HGB 16.4*  --   --  15.2*  --   --   --   --  14.7  HCT 46.8*  --   --  44.1  --   --   --   --  42.9  PLT 252  --   --  249  --   --   --   --  220  HEPARINUNFRC  --   --   < >  --   < >  --  0.36 0.51 0.64  CREATININE 0.66  --   --  0.71  --   --   --   --   --   TROPONINI  --  1.35*  --  1.24*  --  0.85*  --   --   --   < > = values in this interval not displayed. Estimated Creatinine Clearance: 81.3 mL/min (by C-G formula based on Cr of 0.71).  Assessment: 59 y/o female on heparin for NSTEMI. Heparin level is therapeutic at 0.64 on 1400 units/hr. No bleeding noted.  Goal of Therapy:  Heparin level 0.3-0.7 units/ml Monitor platelets by anticoagulation protocol: Yes   Plan:  -Continue heparin drip at 1400 units/hr -Daily CBC/HL -Monitor for bleeding   Melburn Popper, PharmD Clinical Pharmacy Resident Pager: [phone] 08/20/2015 8:30 AM

## 2015-08-20 NOTE — Progress Notes (Signed)
Contacted by RN re: chest pain  Pain decreasing w/ SL NTG x 1, ECG pending.  Will add Nitro paste and morphine PRN.  Follow ECG, if ST elevation, may need more urgent cath. Otherwise, with neg ez, continue med rx.  Rosaria Ferries, PA-C 08/20/2015 10:15 AM Beeper [phone]

## 2015-08-20 NOTE — Progress Notes (Signed)
Pt stating 7/10 CP. I sublingual nitro given. CP subsided. EKG obtained. NSR. BP 140/69

## 2015-08-21 LAB — CBC
HEMATOCRIT: 41.1 % (ref 36.0–46.0)
Hemoglobin: 13.9 g/dL (ref 12.0–15.0)
MCH: 30.1 pg (ref 26.0–34.0)
MCHC: 33.8 g/dL (ref 30.0–36.0)
MCV: 89 fL (ref 78.0–100.0)
Platelets: 240 10*3/uL (ref 150–400)
RBC: 4.62 MIL/uL (ref 3.87–5.11)
RDW: 13 % (ref 11.5–15.5)
WBC: 6.9 10*3/uL (ref 4.0–10.5)

## 2015-08-21 LAB — HEPARIN LEVEL (UNFRACTIONATED)
HEPARIN UNFRACTIONATED: 0.79 [IU]/mL — AB (ref 0.30–0.70)
HEPARIN UNFRACTIONATED: 0.64 [IU]/mL (ref 0.30–0.70)

## 2015-08-21 LAB — GLUCOSE, CAPILLARY
GLUCOSE-CAPILLARY: 293 mg/dL — AB (ref 65–99)
GLUCOSE-CAPILLARY: 310 mg/dL — AB (ref 65–99)
Glucose-Capillary: 272 mg/dL — ABNORMAL HIGH (ref 65–99)
Glucose-Capillary: 275 mg/dL — ABNORMAL HIGH (ref 65–99)

## 2015-08-21 MED ORDER — ALPRAZOLAM 0.5 MG PO TABS
0.5000 mg | ORAL_TABLET | Freq: Every evening | ORAL | Status: DC | PRN
  Administered 2015-08-21 (×2): 0.5 mg via ORAL
  Filled 2015-08-21 (×3): qty 1

## 2015-08-21 MED ORDER — SODIUM CHLORIDE 0.9 % IV SOLN: 250.0000 mL | INTRAVENOUS | Status: DC | PRN

## 2015-08-21 MED ORDER — SODIUM CHLORIDE 0.9 % IJ SOLN: 3.0000 mL | INTRAMUSCULAR | Status: DC | PRN

## 2015-08-21 MED ORDER — SODIUM CHLORIDE 0.9 % IJ SOLN
3.0000 mL | Freq: Two times a day (BID) | INTRAMUSCULAR | Status: DC
  Administered 2015-08-21: 3 mL via INTRAVENOUS

## 2015-08-21 MED ORDER — ASPIRIN 81 MG PO CHEW
81.0000 mg | CHEWABLE_TABLET | ORAL | Status: AC
  Administered 2015-08-22: 81 mg via ORAL
  Filled 2015-08-21: qty 1

## 2015-08-21 MED ORDER — NITROGLYCERIN IN D5W 200-5 MCG/ML-% IV SOLN
5.0000 ug/min | INTRAVENOUS | Status: DC
Start: 2015-08-21 — End: 2015-08-22
  Administered 2015-08-21: 5 ug/min via INTRAVENOUS
  Filled 2015-08-21: qty 250

## 2015-08-21 MED ORDER — SODIUM CHLORIDE 0.9 % WEIGHT BASED INFUSION: 1.0000 mL/kg/h | INTRAVENOUS | Status: DC

## 2015-08-21 MED ORDER — SODIUM CHLORIDE 0.9 % WEIGHT BASED INFUSION
3.0000 mL/kg/h | INTRAVENOUS | Status: DC
Start: 2015-08-22 — End: 2015-08-22
  Administered 2015-08-22: 3 mL/kg/h via INTRAVENOUS

## 2015-08-21 NOTE — Progress Notes (Signed)
Pt complaining of chest pain, prn morphine given per orders, EKG performed, MD notified, Edward Qualia RN

## 2015-08-21 NOTE — Progress Notes (Signed)
ANTICOAGULATION CONSULT NOTE - Follow Up Consult  Pharmacy Consult for Heparin  Indication: chest pain/ACS  No Known Allergies  Patient Measurements: Height: 5\' 2"  (157.5 cm) Weight: 196 lb 8 oz (89.132 kg) IBW/kg (Calculated) : 50.1  Vital Signs: Temp: 98.3 F (36.8 C) (09/05 1231) Temp Source: Oral (09/05 1231) BP: 117/66 mmHg (09/05 1231) Pulse Rate: 76 (09/05 1231)  Labs:  Recent Labs  08/18/15 1955  08/19/15 0014  08/19/15 0605  08/20/15 0621 08/21/15 0335 08/21/15 1127  HGB  --   < > 15.2*  --   --   --  14.7 13.9  --   HCT  --   --  44.1  --   --   --  42.9 41.1  --   PLT  --   --  249  --   --   --  220 240  --   HEPARINUNFRC  --   < >  --   < >  --   < > 0.64 0.79* 0.64  CREATININE  --   --  0.71  --   --   --   --   --   --   TROPONINI 1.35*  --  1.24*  --  0.85*  --   --   --   --   < > = values in this interval not displayed. Estimated Creatinine Clearance: 81.4 mL/min (by C-G formula based on Cr of 0.71).  Assessment: 59 year old female with NSTEMI on anticoagulation with Heparin.  Her heparin level is therapeutic.  Cath Tuesday. CBC is stable, no bleeding noted.  Goal of Therapy:  Heparin level 0.3-0.7 units/ml Monitor platelets by anticoagulation protocol: Yes   Plan:  Continue Heparin at 1250 units/hr Daily heparin level and CBC  Legrand Como, Pharm.D., BCPS, AAHIVP Clinical Pharmacist Phone: [phone] or [phone] 08/21/2015, 1:14 PM

## 2015-08-21 NOTE — Progress Notes (Signed)
ANTICOAGULATION CONSULT NOTE - Follow Up Consult  Pharmacy Consult for Heparin  Indication: chest pain/ACS  No Known Allergies  Patient Measurements: Height: 5\' 2"  (157.5 cm) Weight: 195 lb 14.4 oz (88.86 kg) IBW/kg (Calculated) : 50.1  Vital Signs: Temp: 98.1 F (36.7 C) (09/04 2125) Temp Source: Oral (09/04 2125) BP: 142/88 mmHg (09/04 2125) Pulse Rate: 92 (09/04 2125)  Labs:  Recent Labs  08/18/15 1301 08/18/15 1955  08/19/15 0014  08/19/15 0605  08/19/15 1930 08/20/15 0621 08/21/15 0335  HGB 16.4*  --   --  15.2*  --   --   --   --  14.7 13.9  HCT 46.8*  --   --  44.1  --   --   --   --  42.9 41.1  PLT 252  --   --  249  --   --   --   --  220 240  HEPARINUNFRC  --   --   < >  --   < >  --   < > 0.51 0.64 0.79*  CREATININE 0.66  --   --  0.71  --   --   --   --   --   --   TROPONINI  --  1.35*  --  1.24*  --  0.85*  --   --   --   --   < > = values in this interval not displayed. Estimated Creatinine Clearance: 81.3 mL/min (by C-G formula based on Cr of 0.71).  Assessment: Supra-therapeutic heparin level, no issues per RN.   Goal of Therapy:  Heparin level 0.3-0.7 units/ml Monitor platelets by anticoagulation protocol: Yes   Plan:  -Decrease heparin to 1250 units/hr -1200 HL -Daily CBC/HL -Monitor for bleeding  Narda Bonds 08/21/2015,5:33 AM

## 2015-08-21 NOTE — Progress Notes (Signed)
Pt very anxious, unable to sleep. Paged Cards Fellow. Orders received.

## 2015-08-21 NOTE — Progress Notes (Signed)
Patient ID: [name], female   DOB: 1956/01/04, 59 y.o.   MRN: [id number]    Primary cardiologist:  Subjective:   No pain overnight or this AM.    Objective:   Temp:  [98.1 F (36.7 C)-98.4 F (36.9 C)] 98.1 F (36.7 C) (09/05 0500) Pulse Rate:  [89-92] 89 (09/05 0500) Resp:  [18] 18 (09/04 1415) BP: (133-142)/(67-88) 137/67 mmHg (09/05 0500) SpO2:  [98 %-100 %] 100 % (09/05 0500) Weight:  [196 lb 8 oz (89.132 kg)] 196 lb 8 oz (89.132 kg) (09/05 0500) Last BM Date: 08/19/15  Filed Weights   08/19/15 0540 08/20/15 0500 08/21/15 0500  Weight: 195 lb 12.8 oz (88.814 kg) 195 lb 14.4 oz (88.86 kg) 196 lb 8 oz (89.132 kg)    Intake/Output Summary (Last 24 hours) at 08/21/15 0800 Last data filed at 08/20/15 2159  Gross per 24 hour  Intake    840 ml  Output      0 ml  Net    840 ml    Telemetry: SR and sinus tach  Exam:  General: NAD  Resp: CTAB  Cardiac: RRR, no m/r/g, no JVD  GI: abdomen soft, NT, ND  MSK: no LE edema  Neuro: no focal deficits  Psych:appropriate affect  Lab Results:  Basic Metabolic Panel:  Recent Labs Lab 08/18/15 1301 08/19/15 0014  NA 135 138  K 3.7 3.3*  CL 100* 102  CO2 25 26  GLUCOSE 291* 275*  BUN 8 12  CREATININE 0.66 0.71  CALCIUM 9.2 9.1    Liver Function Tests:  Recent Labs Lab 08/18/15 1301  AST 25  ALT 21  ALKPHOS 105  BILITOT 0.9  PROT 7.5  ALBUMIN 3.7    CBC:  Recent Labs Lab 08/19/15 0014 08/20/15 0621 08/21/15 0335  WBC 9.5 6.5 6.9  HGB 15.2* 14.7 13.9  HCT 44.1 42.9 41.1  MCV 88.0 88.6 89.0  PLT 249 220 240    Cardiac Enzymes:  Recent Labs Lab 08/18/15 1955 08/19/15 0014 08/19/15 0605  TROPONINI 1.35* 1.24* 0.85*    BNP: No results for input(s): PROBNP in the last 8760 hours.  Coagulation: No results for input(s): INR in the last 168 hours.  ECG:   Medications:   Scheduled Medications: . aspirin EC  81 mg Oral Daily  . atorvastatin  80 mg Oral q1800  . insulin  aspart  0-15 Units Subcutaneous TID WC  . lisinopril  10 mg Oral Daily  . metoprolol  25 mg Oral BID  . nitroGLYCERIN  1 inch Topical 4 times per day  . pantoprazole  40 mg Oral Daily     Infusions: . heparin 1,250 Units/hr (08/21/15 0539)     PRN Medications:  acetaminophen, ALPRAZolam, morphine injection, nitroGLYCERIN, ondansetron (ZOFRAN) IV     Assessment/Plan   1. NSTEMI - trop trending down, peak 1.35 -echo 08/2015 LVEF 65-68%, grade I diastolic dysfunction. No WMAs - medial therapy with ASA, atorva 80, lisinorpil , lopressor, hep gtt. - plan for [facility] tomorrow.         Carlyle Dolly, M.D.

## 2015-08-22 ENCOUNTER — Encounter (HOSPITAL_COMMUNITY): Payer: Self-pay | Admitting: Cardiology

## 2015-08-22 ENCOUNTER — Encounter (HOSPITAL_COMMUNITY)
Admission: EM | Disposition: A | Discharge status: Home / Self Care | Payer: Self-pay | Source: Home / Self Care | Attending: Cardiovascular Disease

## 2015-08-22 DIAGNOSIS — Z9861 Coronary angioplasty status: Secondary | ICD-10-CM

## 2015-08-22 DIAGNOSIS — I2510 Atherosclerotic heart disease of native coronary artery without angina pectoris: Secondary | ICD-10-CM | POA: Diagnosis present

## 2015-08-22 HISTORY — PX: CARDIAC CATHETERIZATION: SHX172

## 2015-08-22 LAB — CBC
HCT: 40.9 % (ref 36.0–46.0)
Hemoglobin: 13.6 g/dL (ref 12.0–15.0)
MCH: 29.6 pg (ref 26.0–34.0)
MCHC: 33.3 g/dL (ref 30.0–36.0)
MCV: 89.1 fL (ref 78.0–100.0)
PLATELETS: 225 10*3/uL (ref 150–400)
RBC: 4.59 MIL/uL (ref 3.87–5.11)
RDW: 13 % (ref 11.5–15.5)
WBC: 6.1 10*3/uL (ref 4.0–10.5)

## 2015-08-22 LAB — GLUCOSE, CAPILLARY
GLUCOSE-CAPILLARY: 281 mg/dL — AB (ref 65–99)
GLUCOSE-CAPILLARY: 267 mg/dL — AB (ref 65–99)
Glucose-Capillary: 290 mg/dL — ABNORMAL HIGH (ref 65–99)
Glucose-Capillary: 348 mg/dL — ABNORMAL HIGH (ref 65–99)

## 2015-08-22 LAB — BASIC METABOLIC PANEL
Anion gap: 8 (ref 5–15)
BUN: 12 mg/dL (ref 6–20)
CALCIUM: 9.4 mg/dL (ref 8.9–10.3)
CO2: 27 mmol/L (ref 22–32)
CREATININE: 0.81 mg/dL (ref 0.44–1.00)
Chloride: 104 mmol/L (ref 101–111)
GFR calc Af Amer: >60 mL/min (ref >60)
GFR calc non Af Amer: >60 mL/min (ref >60)
GLUCOSE: 328 mg/dL — AB (ref 65–99)
Potassium: 4.3 mmol/L (ref 3.5–5.1)
Sodium: 139 mmol/L (ref 135–145)

## 2015-08-22 LAB — POCT ACTIVATED CLOTTING TIME: ACTIVATED CLOTTING TIME: 724 s

## 2015-08-22 LAB — HEPARIN LEVEL (UNFRACTIONATED): Heparin Unfractionated: 0.79 IU/mL — ABNORMAL HIGH (ref 0.30–0.70)

## 2015-08-22 LAB — PROTIME-INR
INR: 1.11 (ref 0.00–1.49)
Prothrombin Time: 14.5 seconds (ref 11.6–15.2)

## 2015-08-22 SURGERY — LEFT HEART CATH AND CORONARY ANGIOGRAPHY

## 2015-08-22 SURGERY — LEFT HEART CATH AND CORONARY ANGIOGRAPHY
Anesthesia: LOCAL

## 2015-08-22 MED ORDER — NITROGLYCERIN 1 MG/10 ML FOR IR/CATH LAB
INTRA_ARTERIAL | Status: DC | PRN
  Administered 2015-08-22: 08:00:00

## 2015-08-22 MED ORDER — LIDOCAINE HCL (PF) 1 % IJ SOLN
INTRAMUSCULAR | Status: AC
  Filled 2015-08-22: qty 30

## 2015-08-22 MED ORDER — HEPARIN SODIUM (PORCINE) 1000 UNIT/ML IJ SOLN
INTRAMUSCULAR | Status: DC | PRN
  Administered 2015-08-22: 4500 [IU] via INTRAVENOUS

## 2015-08-22 MED ORDER — NITROGLYCERIN IN D5W 200-5 MCG/ML-% IV SOLN
5.0000 ug/min | INTRAVENOUS | Status: DC
  Administered 2015-08-22: 33.333 ug/min via INTRAVENOUS
  Filled 2015-08-22: qty 250

## 2015-08-22 MED ORDER — TICAGRELOR 90 MG PO TABS
ORAL_TABLET | ORAL | Status: DC | PRN
  Administered 2015-08-22: 180 mg via ORAL

## 2015-08-22 MED ORDER — NITROGLYCERIN 1 MG/10 ML FOR IR/CATH LAB
INTRA_ARTERIAL | Status: DC | PRN
  Administered 2015-08-22 (×4): 200 ug

## 2015-08-22 MED ORDER — TICAGRELOR 90 MG PO TABS
ORAL_TABLET | ORAL | Status: AC
  Filled 2015-08-22: qty 2

## 2015-08-22 MED ORDER — FENTANYL CITRATE (PF) 100 MCG/2ML IJ SOLN
INTRAMUSCULAR | Status: AC
  Filled 2015-08-22: qty 4

## 2015-08-22 MED ORDER — BIVALIRUDIN 250 MG IV SOLR
INTRAVENOUS | Status: AC
  Filled 2015-08-22: qty 250

## 2015-08-22 MED ORDER — SODIUM CHLORIDE 0.9 % IJ SOLN: 3.0000 mL | INTRAMUSCULAR | Status: DC | PRN

## 2015-08-22 MED ORDER — BIVALIRUDIN BOLUS VIA INFUSION - CUPID
INTRAVENOUS | Status: DC | PRN
  Administered 2015-08-22: 67.05 mg via INTRAVENOUS

## 2015-08-22 MED ORDER — LIDOCAINE HCL (PF) 1 % IJ SOLN
INTRAMUSCULAR | Status: DC | PRN
  Administered 2015-08-22: 15:00:00

## 2015-08-22 MED ORDER — IOHEXOL 350 MG/ML SOLN
INTRAVENOUS | Status: DC | PRN
  Administered 2015-08-22: 50 mL via INTRAVENOUS
  Administered 2015-08-22 (×2): 100 mL via INTRAVENOUS

## 2015-08-22 MED ORDER — FENTANYL CITRATE (PF) 100 MCG/2ML IJ SOLN
INTRAMUSCULAR | Status: DC | PRN
  Administered 2015-08-22: 50 ug via INTRAVENOUS

## 2015-08-22 MED ORDER — MIDAZOLAM HCL 2 MG/2ML IJ SOLN
INTRAMUSCULAR | Status: DC | PRN
  Administered 2015-08-22: 1 mg via INTRAVENOUS
  Administered 2015-08-22: 2 mg via INTRAVENOUS

## 2015-08-22 MED ORDER — TICAGRELOR 90 MG PO TABS
90.0000 mg | ORAL_TABLET | Freq: Two times a day (BID) | ORAL | Status: DC
  Administered 2015-08-22 – 2015-08-24 (×4): 90 mg via ORAL
  Filled 2015-08-22 (×4): qty 1

## 2015-08-22 MED ORDER — NITROGLYCERIN 1 MG/10 ML FOR IR/CATH LAB
INTRA_ARTERIAL | Status: AC
  Filled 2015-08-22: qty 10

## 2015-08-22 MED ORDER — MIDAZOLAM HCL 2 MG/2ML IJ SOLN
INTRAMUSCULAR | Status: AC
  Filled 2015-08-22: qty 4

## 2015-08-22 MED ORDER — SODIUM CHLORIDE 0.9 % IV SOLN: 250.0000 mL | INTRAVENOUS | Status: DC | PRN

## 2015-08-22 MED ORDER — NITROGLYCERIN 1 MG/10 ML FOR IR/CATH LAB
INTRA_ARTERIAL | Status: DC | PRN
  Administered 2015-08-22: 09:00:00

## 2015-08-22 MED ORDER — HYDRALAZINE HCL 20 MG/ML IJ SOLN
INTRAMUSCULAR | Status: AC
  Filled 2015-08-22: qty 1

## 2015-08-22 MED ORDER — ALPRAZOLAM 0.25 MG PO TABS
0.2500 mg | ORAL_TABLET | Freq: Once | ORAL | Status: AC
Start: 2015-08-22 — End: 2015-08-22
  Administered 2015-08-22: 13:00:00 0.25 mg via ORAL

## 2015-08-22 MED ORDER — SODIUM CHLORIDE 0.9 % IJ SOLN
3.0000 mL | Freq: Two times a day (BID) | INTRAMUSCULAR | Status: DC
  Administered 2015-08-22 – 2015-08-23 (×2): 3 mL via INTRAVENOUS

## 2015-08-22 MED ORDER — SODIUM CHLORIDE 0.9 % WEIGHT BASED INFUSION: 3.0000 mL/kg/h | INTRAVENOUS | Status: AC

## 2015-08-22 MED ORDER — IOHEXOL 350 MG/ML SOLN
INTRAVENOUS | Status: DC | PRN
  Administered 2015-08-22: 50 mL via INTRACARDIAC

## 2015-08-22 MED ORDER — SODIUM CHLORIDE 0.9 % IV SOLN
250.0000 mg | INTRAVENOUS | Status: DC | PRN
  Administered 2015-08-22 (×2): 1.75 mg/kg/h via INTRAVENOUS

## 2015-08-22 MED ORDER — LABETALOL HCL 5 MG/ML IV SOLN
INTRAVENOUS | Status: AC
  Filled 2015-08-22: qty 4

## 2015-08-22 MED ORDER — VERAPAMIL HCL 2.5 MG/ML IV SOLN
INTRAVENOUS | Status: AC
  Filled 2015-08-22: qty 2

## 2015-08-22 MED ORDER — HEPARIN (PORCINE) IN NACL 2-0.9 UNIT/ML-% IJ SOLN
INTRAMUSCULAR | Status: AC
  Filled 2015-08-22: qty 1000

## 2015-08-22 MED ORDER — LIDOCAINE HCL (PF) 1 % IJ SOLN
INTRAMUSCULAR | Status: DC | PRN
  Administered 2015-08-22: 2 mL

## 2015-08-22 MED ORDER — LABETALOL HCL 5 MG/ML IV SOLN
INTRAVENOUS | Status: DC | PRN
  Administered 2015-08-22: 10 mg via INTRAVENOUS

## 2015-08-22 MED ORDER — ALPRAZOLAM 0.25 MG PO TABS
0.2500 mg | ORAL_TABLET | Freq: Once | ORAL | Status: AC
  Administered 2015-08-22: 0.25 mg via ORAL

## 2015-08-22 MED ORDER — HEPARIN (PORCINE) IN NACL 2-0.9 UNIT/ML-% IJ SOLN
INTRAMUSCULAR | Status: AC
  Filled 2015-08-22: qty 500

## 2015-08-22 MED ORDER — ZOLPIDEM TARTRATE 5 MG PO TABS
5.0000 mg | ORAL_TABLET | Freq: Every evening | ORAL | Status: DC | PRN
  Administered 2015-08-22 – 2015-08-23 (×2): 5 mg via ORAL
  Filled 2015-08-22 (×2): qty 1

## 2015-08-22 MED ORDER — HYDRALAZINE HCL 20 MG/ML IJ SOLN
10.0000 mg | INTRAMUSCULAR | Status: DC | PRN
  Administered 2015-08-22 – 2015-08-23 (×2): 10 mg via INTRAVENOUS
  Filled 2015-08-22 (×3): qty 1

## 2015-08-22 MED ORDER — FENTANYL CITRATE (PF) 100 MCG/2ML IJ SOLN
INTRAMUSCULAR | Status: DC | PRN
  Administered 2015-08-22: 25 ug via INTRAVENOUS
  Administered 2015-08-22: 50 ug via INTRAVENOUS
  Administered 2015-08-22: 25 ug via INTRAVENOUS

## 2015-08-22 MED ORDER — MIDAZOLAM HCL 2 MG/2ML IJ SOLN
INTRAMUSCULAR | Status: DC | PRN
  Administered 2015-08-22: 2 mg via INTRAVENOUS

## 2015-08-22 MED ORDER — SODIUM CHLORIDE 0.9 % IJ SOLN
3.0000 mL | Freq: Two times a day (BID) | INTRAMUSCULAR | Status: DC
  Administered 2015-08-23 (×2): 3 mL via INTRAVENOUS

## 2015-08-22 MED ORDER — RADIAL COCKTAIL (HEPARIN/VERAPAMIL/LIDOCAINE/NITRO)
Status: DC | PRN
  Administered 2015-08-22: 20 mL via INTRA_ARTERIAL

## 2015-08-22 MED ORDER — SODIUM CHLORIDE 0.9 % WEIGHT BASED INFUSION
3.0000 mL/kg/h | INTRAVENOUS | Status: AC
Start: 2015-08-22 — End: 2015-08-22

## 2015-08-22 SURGICAL SUPPLY — 6 items
CATH INFINITI 5FR MULTPACK ANG (CATHETERS) ×3 IMPLANT
KIT HEART LEFT (KITS) ×3 IMPLANT
PACK CARDIAC CATHETERIZATION (CUSTOM PROCEDURE TRAY) ×3 IMPLANT
SHEATH PINNACLE 5F 10CM (SHEATH) ×3 IMPLANT
TRANSDUCER W/STOPCOCK (MISCELLANEOUS) ×3 IMPLANT
WIRE EMERALD 3MM-J .035X150CM (WIRE) ×3 IMPLANT

## 2015-08-22 SURGICAL SUPPLY — 24 items
BALLN EMERGE MR 2.5X20 (BALLOONS) ×2
BALLN ~~LOC~~ EMERGE MR 4.0X8 (BALLOONS) ×2
BALLN ~~LOC~~ EUPHORA RX 3.25X15 (BALLOONS) ×2
BALLN ~~LOC~~ TREK RX 4.0X15 (BALLOONS) ×2
BALLOON EMERGE MR 2.5X20 (BALLOONS) ×1 IMPLANT
BALLOON ~~LOC~~ EMERGE MR 4.0X8 (BALLOONS) ×1 IMPLANT
BALLOON ~~LOC~~ EUPHORA RX 3.25X15 (BALLOONS) ×1 IMPLANT
BALLOON ~~LOC~~ TREK RX 4.0X15 (BALLOONS) ×1 IMPLANT
CATH INFINITI 5FR ANG PIGTAIL (CATHETERS) ×2 IMPLANT
CATH OPTITORQUE TIG 4.0 5F (CATHETERS) ×2 IMPLANT
CATH VISTA GUIDE 6FR JR4 (CATHETERS) ×2 IMPLANT
CATH VISTA GUIDE 6FR XBLAD3.5 (CATHETERS) ×2 IMPLANT
DEVICE RAD COMP TR BAND LRG (VASCULAR PRODUCTS) ×4 IMPLANT
GLIDESHEATH SLEND A-KIT 6F 22G (SHEATH) ×2 IMPLANT
KIT ENCORE 26 ADVANTAGE (KITS) ×2 IMPLANT
KIT HEART LEFT (KITS) ×2 IMPLANT
PACK CARDIAC CATHETERIZATION (CUSTOM PROCEDURE TRAY) ×2 IMPLANT
STENT SYNERGY DES 2.75X24 (Permanent Stent) ×2 IMPLANT
STENT SYNERGY DES 3.5X28 (Permanent Stent) ×2 IMPLANT
TRANSDUCER W/STOPCOCK (MISCELLANEOUS) ×2 IMPLANT
TUBING CIL FLEX 10 FLL-RA (TUBING) ×2 IMPLANT
WIRE COUGAR XT STRL 190CM (WIRE) ×2 IMPLANT
WIRE HI TORQ BMW 190CM (WIRE) ×2 IMPLANT
WIRE SAFE-T 1.5MM-J .035X260CM (WIRE) ×2 IMPLANT

## 2015-08-22 NOTE — Interval H&P Note (Signed)
History and Physical Interval Note:  08/22/2015 7:27 AM  [name]  has presented today for surgery, with the diagnosis of NSTEMI The various methods of treatment have been discussed with the patient and family. After consideration of risks, benefits and other options for treatment, the patient has consented to  Procedure(s): Left Heart Cath and Coronary Angiography (N/A) with possible Percutaneous Coronary Intervention as a surgical intervention .  The patient's history has been reviewed, patient examined, no change in status, stable for surgery.  I have reviewed the patient's chart and labs.  Questions were answered to the patient's satisfaction.     Cath Lab Visit (complete for each Cath Lab visit)  Clinical Evaluation Leading to the Procedure:   ACS: Yes.    Non-ACS:    Anginal Classification: CCS IV  Anti-ischemic medical therapy: Maximal Therapy (2 or more classes of medications)  Non-Invasive Test Results: No non-invasive testing performed  Prior CABG: No previous CABG    AUC: TIMI Score  Patient Information:  TIMI Score is 4   UA/NSTEMI and intermediate-risk features (e.g., TIMI score 3-4) for short-term risk of death or nonfatal MI  Revascularization of the presumed culprit artery   A (8)  Indication: 10; Score: 8   HARDING, Leonie Green, M.D., M.S. Interventional Cardiologist   Pager # [phone]

## 2015-08-22 NOTE — Progress Notes (Signed)
Inpatient Diabetes Program Recommendations  AACE/ADA: New Consensus Statement on Inpatient Glycemic Control (2013)  Target Ranges:  Prepandial:   less than 140 mg/dL      Peak postprandial:   less than 180 mg/dL (1-2 hours)      Critically ill patients:  140 - 180 mg/dL   GLYCEMIC CONTROL RECOMMENDATIONS  Diabetes history: DM2 Outpatient Diabetes medications: None Current orders for Inpatient glycemic control: Novolog moderate tidwc  Results for JESENIA, SPERA (MRN [id number]) as of 08/22/2015 12:59  Ref. Range 08/21/2015 07:16 08/21/2015 11:20 08/21/2015 16:21 08/21/2015 22:01 08/22/2015 10:36  Glucose-Capillary Latest Ref Range: 65-99 mg/dL 272 (H) 275 (H) 293 (H) 310 (H) 281 (H)  Results for ASJAH, RAUDA (MRN [id number]) as of 08/22/2015 12:59  Ref. Range 08/18/2015 19:55  Hemoglobin A1C Latest Ref Range: 4.8-5.6 % 11.4 (H)   Poor glucose control.  Inpatient Diabetes Program Recommendations Insulin - Basal: Consider addition of Lantus 35 units QHS Correction (SSI): Add HS correction Insulin - Meal Coverage: Novolog 4 units tidwc for meal coverage insulin HgbA1C: 11.4% - uncontrolled Diet: CHO mod med  Note: Has been on insulin in the past. Needs basal insulin. Needs MD to follow her diabetes.  [place].  Will follow. Thank you. Lorenda Peck, RD, LDN, CDE Inpatient Diabetes Coordinator [phone]

## 2015-08-22 NOTE — H&P (View-Only) (Signed)
  Called to patient's bedside due to chest pain. She is s/p 2 vessel PCI on the 2 culprit lesions in the mid RCA and mid LAD with drug eluting stents earlier today. STAT EKG shows no ST elevations. Only new finding is slight TWI in lead II (new from EKG immediately following intervention). Her BP is elevated at 181/105. She is on IV nitro. Will give IV hydralazine to see if CP improves with better BP control. Dr. Ellyn Hack notified. Continue to monitor closely.   [name]

## 2015-08-22 NOTE — H&P (View-Only) (Signed)
Patient ID: [name], female   DOB: January 15, 1956, 59 y.o.   MRN: [id number]    Primary cardiologist:  Subjective:   No pain overnight or this AM.    Objective:   Temp:  [98.1 F (36.7 C)-98.4 F (36.9 C)] 98.1 F (36.7 C) (09/05 0500) Pulse Rate:  [89-92] 89 (09/05 0500) Resp:  [18] 18 (09/04 1415) BP: (133-142)/(67-88) 137/67 mmHg (09/05 0500) SpO2:  [98 %-100 %] 100 % (09/05 0500) Weight:  [196 lb 8 oz (89.132 kg)] 196 lb 8 oz (89.132 kg) (09/05 0500) Last BM Date: 08/19/15  Filed Weights   08/19/15 0540 08/20/15 0500 08/21/15 0500  Weight: 195 lb 12.8 oz (88.814 kg) 195 lb 14.4 oz (88.86 kg) 196 lb 8 oz (89.132 kg)    Intake/Output Summary (Last 24 hours) at 08/21/15 0800 Last data filed at 08/20/15 2159  Gross per 24 hour  Intake    840 ml  Output      0 ml  Net    840 ml    Telemetry: SR and sinus tach  Exam:  General: NAD  Resp: CTAB  Cardiac: RRR, no m/r/g, no JVD  GI: abdomen soft, NT, ND  MSK: no LE edema  Neuro: no focal deficits  Psych:appropriate affect  Lab Results:  Basic Metabolic Panel:  Recent Labs Lab 08/18/15 1301 08/19/15 0014  NA 135 138  K 3.7 3.3*  CL 100* 102  CO2 25 26  GLUCOSE 291* 275*  BUN 8 12  CREATININE 0.66 0.71  CALCIUM 9.2 9.1    Liver Function Tests:  Recent Labs Lab 08/18/15 1301  AST 25  ALT 21  ALKPHOS 105  BILITOT 0.9  PROT 7.5  ALBUMIN 3.7    CBC:  Recent Labs Lab 08/19/15 0014 08/20/15 0621 08/21/15 0335  WBC 9.5 6.5 6.9  HGB 15.2* 14.7 13.9  HCT 44.1 42.9 41.1  MCV 88.0 88.6 89.0  PLT 249 220 240    Cardiac Enzymes:  Recent Labs Lab 08/18/15 1955 08/19/15 0014 08/19/15 0605  TROPONINI 1.35* 1.24* 0.85*    BNP: No results for input(s): PROBNP in the last 8760 hours.  Coagulation: No results for input(s): INR in the last 168 hours.  ECG:   Medications:   Scheduled Medications: . aspirin EC  81 mg Oral Daily  . atorvastatin  80 mg Oral q1800  . insulin  aspart  0-15 Units Subcutaneous TID WC  . lisinopril  10 mg Oral Daily  . metoprolol  25 mg Oral BID  . nitroGLYCERIN  1 inch Topical 4 times per day  . pantoprazole  40 mg Oral Daily     Infusions: . heparin 1,250 Units/hr (08/21/15 0539)     PRN Medications:  acetaminophen, ALPRAZolam, morphine injection, nitroGLYCERIN, ondansetron (ZOFRAN) IV     Assessment/Plan   1. NSTEMI - trop trending down, peak 1.35 -echo 08/2015 LVEF 94-70%, grade I diastolic dysfunction. No WMAs - medial therapy with ASA, atorva 80, lisinorpil , lopressor, hep gtt. - plan for [facility] tomorrow.         Carlyle Dolly, M.D.

## 2015-08-22 NOTE — Care Management Note (Signed)
Case Management Note  Patient Details  Name: [name] MRN: [id number] Date of Birth: Aug 04, 1956  Subjective/Objective: Pt admitted for Nstemi- post cath. Initiated on Brilinta. Pt is listed as having no insurance. Financial Counselor has contacted pt in reference to possible Medicaid.                 Action/Plan: CM will provide pt with Brilinta 30 day free card. Pt will need Rx for 30 day free no refills. Pt will be able to utilize the [facility] for medication assistance. Medication will cost between $4.00 and $10.00. Hospital f/u scheduled for September 13 at 3:00 pm @ the [facility]. [name] No further needs at this time.    Expected Discharge Date:                  Expected Discharge Plan:  Home/Self Care  In-House Referral:  Financial Counselor  Discharge planning Services  CM Consult, Follow-up appt scheduled, [facility], Medication Assistance  Post Acute Care Choice:  NA Choice offered to:  NA  DME Arranged:  N/A DME Agency:  NA  HH Arranged:  NA HH Agency:  NA  Status of Service:  Completed, signed off  Medicare Important Message Given:    Date Medicare IM Given:    Medicare IM give by:    Date Additional Medicare IM Given:    Additional Medicare Important Message give by:     If discussed at [place] of Stay Meetings, dates discussed:    Additional Comments:  [name], RN 08/22/2015, 11:45 AM

## 2015-08-22 NOTE — Progress Notes (Signed)
Site area: rt groin fa sheath Site Prior to Removal:  Level 0 Pressure Applied For:  20 minutes Manual:   yes Patient Status During Pull:  stable Post Pull Site:  Level  0 Post Pull Instructions Given:  yes Post Pull Pulses Present: yes Dressing Applied:  tegaderm Bedrest begins @  2081 Comments:  0

## 2015-08-22 NOTE — Progress Notes (Signed)
TR BAND REMOVAL  LOCATION:    right radial  DEFLATED PER PROTOCOL:    Yes.    TIME BAND OFF / DRESSING APPLIED:    1430   SITE UPON ARRIVAL:    Level 0  SITE AFTER BAND REMOVAL:    Level 0  REVERSE ALLEN'S TEST:     positive  CIRCULATION SENSATION AND MOVEMENT:    Within Normal Limits   Yes.    COMMENTS:   Tolerated procedure well 

## 2015-08-22 NOTE — Interval H&P Note (Signed)
History and Physical Interval Note:  08/22/2015 3:06 PM  [name]  has presented today for surgery, with the diagnosis of relook cath - following 2 Vessel PCI earlier today. She has had recurrent /persistent pain that began as 10/10 - is now 5/10 after titration of medications.  Given recent PCI & recurrent CP concerning for Unstable Angina with possible PCI related complications.    The various methods of treatment have been discussed with the patient and family. After consideration of risks, benefits and other options for treatment, the patient has consented to  Procedure(s): Left Heart Cath and Coronary Angiography (N/A) +/- Percutaneous Coronary Intervention as a surgical intervention .  The patient's history has been reviewed, patient examined, no change in status, stable for surgery.  I have reviewed the patient's chart and labs.  Questions were answered to the patient's satisfaction.     [place], [place]  Cath Lab Visit (complete for each Cath Lab visit)  Clinical Evaluation Leading to the Procedure:   ACS: Yes.    Non-ACS:    Anginal Classification: CCS IV  Anti-ischemic medical therapy: Maximal Therapy (2 or more classes of medications)  Non-Invasive Test Results: No non-invasive testing performed  Prior CABG: No previous CABG

## 2015-08-22 NOTE — Progress Notes (Signed)
  Called to patient's bedside due to chest pain. She is s/p 2 vessel PCI on the 2 culprit lesions in the mid RCA and mid LAD with drug eluting stents earlier today. STAT EKG shows no ST elevations. Only new finding is slight TWI in lead II (new from EKG immediately following intervention). Her BP is elevated at 181/105. She is on IV nitro. Will give IV hydralazine to see if CP improves with better BP control. Dr. Ellyn Hack notified. Continue to monitor closely.   SIMMONS, BRITTAINY

## 2015-08-22 NOTE — Progress Notes (Signed)
UR Completed Vivica Dobosz Graves-Bigelow, RN,BSN [phone]  

## 2015-08-23 ENCOUNTER — Encounter (HOSPITAL_COMMUNITY): Payer: Self-pay | Admitting: Cardiology

## 2015-08-23 DIAGNOSIS — Z9861 Coronary angioplasty status: Secondary | ICD-10-CM

## 2015-08-23 DIAGNOSIS — I214 Non-ST elevation (NSTEMI) myocardial infarction: Principal | ICD-10-CM

## 2015-08-23 DIAGNOSIS — I2510 Atherosclerotic heart disease of native coronary artery without angina pectoris: Secondary | ICD-10-CM

## 2015-08-23 LAB — BASIC METABOLIC PANEL
Anion gap: 11 (ref 5–15)
BUN: 9 mg/dL (ref 6–20)
CHLORIDE: 105 mmol/L (ref 101–111)
CO2: 23 mmol/L (ref 22–32)
CREATININE: 0.71 mg/dL (ref 0.44–1.00)
Calcium: 9.4 mg/dL (ref 8.9–10.3)
GFR calc Af Amer: >60 mL/min (ref >60)
Glucose, Bld: 234 mg/dL — ABNORMAL HIGH (ref 65–99)
Potassium: 3.7 mmol/L (ref 3.5–5.1)
SODIUM: 139 mmol/L (ref 135–145)

## 2015-08-23 LAB — CBC
HCT: 41.1 % (ref 36.0–46.0)
Hemoglobin: 14.3 g/dL (ref 12.0–15.0)
MCH: 30.5 pg (ref 26.0–34.0)
MCHC: 34.8 g/dL (ref 30.0–36.0)
MCV: 87.6 fL (ref 78.0–100.0)
PLATELETS: 232 10*3/uL (ref 150–400)
RBC: 4.69 MIL/uL (ref 3.87–5.11)
RDW: 12.9 % (ref 11.5–15.5)
WBC: 7.3 10*3/uL (ref 4.0–10.5)

## 2015-08-23 LAB — GLUCOSE, CAPILLARY
GLUCOSE-CAPILLARY: 245 mg/dL — AB (ref 65–99)
GLUCOSE-CAPILLARY: 245 mg/dL — AB (ref 65–99)
GLUCOSE-CAPILLARY: 275 mg/dL — AB (ref 65–99)
GLUCOSE-CAPILLARY: 349 mg/dL — AB (ref 65–99)
Glucose-Capillary: 259 mg/dL — ABNORMAL HIGH (ref 65–99)

## 2015-08-23 LAB — TROPONIN I: TROPONIN I: 0.81 ng/mL — AB (ref <0.031)

## 2015-08-23 MED ORDER — POLYETHYLENE GLYCOL 3350 17 G PO PACK
17.0000 g | PACK | Freq: Every day | ORAL | Status: DC
  Administered 2015-08-23 – 2015-08-24 (×2): 17 g via ORAL
  Filled 2015-08-23 (×2): qty 1

## 2015-08-23 MED ORDER — LISINOPRIL 10 MG PO TABS
10.0000 mg | ORAL_TABLET | Freq: Once | ORAL | Status: AC
  Administered 2015-08-23: 10 mg via ORAL
  Filled 2015-08-23: qty 1

## 2015-08-23 MED ORDER — LABETALOL HCL 5 MG/ML IV SOLN
20.0000 mg | Freq: Once | INTRAVENOUS | Status: AC
  Administered 2015-08-23: 20 mg via INTRAVENOUS

## 2015-08-23 MED ORDER — INSULIN ASPART 100 UNIT/ML ~~LOC~~ SOLN
6.0000 [IU] | Freq: Once | SUBCUTANEOUS | Status: AC
  Administered 2015-08-23: 6 [IU] via SUBCUTANEOUS

## 2015-08-23 MED ORDER — LIVING WELL WITH DIABETES BOOK
Freq: Once | Status: DC
  Filled 2015-08-23: qty 1

## 2015-08-23 MED ORDER — LISINOPRIL 10 MG PO TABS
20.0000 mg | ORAL_TABLET | Freq: Every day | ORAL | Status: DC
  Administered 2015-08-24: 20 mg via ORAL
  Filled 2015-08-23 (×2): qty 2

## 2015-08-23 MED ORDER — ANGIOPLASTY BOOK
Freq: Once | Status: AC
  Administered 2015-08-23: 22:00:00
  Filled 2015-08-23: qty 1

## 2015-08-23 MED ORDER — FAMOTIDINE 20 MG PO TABS
20.0000 mg | ORAL_TABLET | Freq: Two times a day (BID) | ORAL | Status: DC
  Administered 2015-08-23 – 2015-08-24 (×3): 20 mg via ORAL
  Filled 2015-08-23 (×5): qty 1

## 2015-08-23 MED ORDER — METOPROLOL TARTRATE 25 MG PO TABS
50.0000 mg | ORAL_TABLET | Freq: Two times a day (BID) | ORAL | Status: DC
  Administered 2015-08-23 (×2): 50 mg via ORAL
  Filled 2015-08-23 (×2): qty 2

## 2015-08-23 MED ORDER — LABETALOL HCL 5 MG/ML IV SOLN
INTRAVENOUS | Status: AC
  Filled 2015-08-23: qty 4

## 2015-08-23 MED ORDER — ANGIOPLASTY BOOK
Freq: Once | Status: AC
  Administered 2015-08-23: 15:00:00
  Filled 2015-08-23: qty 1

## 2015-08-23 MED ORDER — GLIMEPIRIDE 2 MG PO TABS
2.0000 mg | ORAL_TABLET | Freq: Every day | ORAL | Status: DC
  Administered 2015-08-23 – 2015-08-24 (×2): 2 mg via ORAL
  Filled 2015-08-23 (×3): qty 1

## 2015-08-23 MED ORDER — HEART ATTACK BOUNCING BOOK
Freq: Once | Status: AC
  Administered 2015-08-23: 15:00:00
  Filled 2015-08-23: qty 1

## 2015-08-23 NOTE — Progress Notes (Signed)
Lisinopril increased to 20mg  daily.  An extra 10mg  given now.  Prescott Truex, PAC

## 2015-08-23 NOTE — Progress Notes (Signed)
CARDIAC REHAB PHASE I   PRE:  Rate/Rhythm: 85 SR    BP: sitting 165/77    SaO2:   MODE:  Ambulation: 450 ft   POST:  Rate/Rhythm: 116 sT    BP: sitting 191/99     SaO2:   Pt able to walk independently. Sts she still hurts in right chest, points to below armpit. Sts its worse with movement. Denies any substernal sensation or "pill stuck" feeling now. BP elevated after walk, HR up to 116 ST (was 130s in BR this am). Began ed and pt voiced understanding. Sts "I've been through all of this before". Will f/u in am. [phone]   [name] [place] CES, ACSM 08/23/2015 1:53 PM

## 2015-08-23 NOTE — Progress Notes (Signed)
Subjective: Still having constant 7-8/10 right sided pain inferior to the clavicle.   Intermittent nausea without vomiting.   Objective: Vital signs in last 24 hours: Temp:  [97.7 F (36.5 C)-98.4 F (36.9 C)] 98.4 F (36.9 C) (09/07 0700) Pulse Rate:  [0-114] 114 (09/07 0700) Resp:  [0-22] 20 (09/07 0700) BP: (128-211)/(56-133) 137/95 mmHg (09/07 0700) SpO2:  [0 %-100 %] 100 % (09/07 0700) Weight:  [200 lb 13.4 oz (91.1 kg)] 200 lb 13.4 oz (91.1 kg) (09/07 0251) Last BM Date: 08/21/15  Intake/Output from previous day: 09/06 0701 - 09/07 0700 In: 884.2 [P.O.:120; I.V.:764.2] Out: 1600 [Urine:1600] Intake/Output this shift:    Medications Scheduled Meds: . aspirin EC  81 mg Oral Daily  . atorvastatin  80 mg Oral q1800  . insulin aspart  0-15 Units Subcutaneous TID WC  . lisinopril  10 mg Oral Daily  . metoprolol  25 mg Oral BID  . pantoprazole  40 mg Oral Daily  . sodium chloride  3 mL Intravenous Q12H  . sodium chloride  3 mL Intravenous Q12H  . ticagrelor  90 mg Oral BID   Continuous Infusions: . nitroGLYCERIN 30 mcg/min (08/23/15 0400)   PRN Meds:.sodium chloride, sodium chloride, acetaminophen, ALPRAZolam, hydrALAZINE, morphine injection, nitroGLYCERIN, ondansetron (ZOFRAN) IV, sodium chloride, sodium chloride, zolpidem  PE: General appearance: alert, cooperative and mild distress Lungs: clear to auscultation bilaterally Heart: reg rhythm.  Rate elevated. No MRG Extremities: No LEE Pulses: 2+ and symmetric Skin: Petechial rash on right wrist/hand Neurologic: Grossly normal  Lab Results:   Recent Labs  08/21/15 0335 08/22/15 0516 08/23/15 0245  WBC 6.9 6.1 7.3  HGB 13.9 13.6 14.3  HCT 41.1 40.9 41.1  PLT 240 225 232   BMET  Recent Labs  08/22/15 0516 08/23/15 0245  NA 139 139  K 4.3 3.7  CL 104 105  CO2 27 23  GLUCOSE 328* 234*  BUN 12 9  CREATININE 0.81 0.71  CALCIUM 9.4 9.4   PT/INR  Recent Labs  08/22/15 0516  LABPROT 14.5    INR 1.11    Assessment/Plan  Active Problems:   NSTEMI (non-ST elevated myocardial infarction)   CAD S/P percutaneous coronary angioplasty   Coronary artery disease involving native coronary artery of native heart with unstable angina pectoris   Constipation   SP Left heart cath revealing severe multivessel CAD with new culprit lesions in mRCA & LAD along with diffuse distal disease in the rPDA and prox OM1 /OM2 lesions previously described.  Prox RCA to Mid RCA lesion, 85% stenosed - just distal to widely patent Promus DES 3.0 mm x 20 mm (from 05/2013). An overlapping Synergy DES 2.75 mmx 24 mm drug-eluting stent was placed.   Prox LAD to Mid LAD lesion, 80% stenosed. A Synergy 3.5 mm x 28 mm drug-eluting stent was placed.  Ost 1st Diag lesion, 40% stenosed. Dist LAD lesion, 90% stenosed in small caliber vessel.  Mid Cx to Dist Cx lesion, 5% stenosed. The lesion was previously treated with a stent (unknown type) greater than two years ago. 2006 - in [name].   Stable findings in small vessels: 1st Mrg lesion, 90% stenosed. Ost 2nd Mrg to 2nd Mrg lesion, 55% stenosed. 3rd Mrg lesion, 80% stenosed.  The left ventricular systolic function is normal with no obvious wall motion abnormalities.  Borderline LVEDP ~17-20 mmHg.  She went back to the cath lab later the same day due to CP and it revealed patent stents in RCA -  from 2014 & this AM. Patent LAD stent.  1st Mrg lesion, 90% stenosed. Ost 2nd Mrg to 2nd Mrg lesion, 55% stenosed. 3rd Mrg lesion, 80% stenosed.  Mid Cx to Dist Cx lesion, 5% stenosed. The lesion was previously treated with a stent (unknown type) greater than two years ago. 2006 - in [place]. Ost 1st Diag to 1st Diag lesion, 40% stenosed. Dist LAD lesion, 90% stenosed.  Normal Systolic Pressure with normal LVEDP.  Widely patent stents in the RCA and LAD from this morning. Improved systolic hypertension with now normal LVEDP.  Continues to have right sided CP.  She is tachycardic at  120 just sitting in bed talking to me. Resp rate seems normal.  Increase metoprolol to 50mg  BID.   ? PE or just from pain.  Probably the latter but etiology not clear.  Not positional.  Morphine helped.  Last CXR was 9/2 and clear.    Need to wean NTG.    Miralax for constipation, zofran for nausea  Ambulate with cardiac rehab and see how she does.     LOS: 4 days    HAGER, BRYAN PA-C 08/23/2015 8:08 AM  I have seen and examined the patient along with Tarri Fuller, PA.  I have reviewed the chart, notes and new data.  I agree with PA's note.  Key new complaints: still uncomfortable, but not in acute distress; chest discomfort feels like "a pill lodged in her throat"; improves after drinking water and returns in a few minutes Key examination changes: tachycardic, otherwise normal exam, BP not perfect, but improved Key new findings / data: ECG without significant ST-T changes  PLAN: She is not ready for DC. Keep on telemetry, adjust BP meds. DC IV NTG, which may be contributing to tachycardia. There appears to be a GI component to her complaints. Small concern for pulmonary embolism, but would hate to give her another contrast load, after 2 cardiac caths in last 24 hours. D-dimer would not be helpful with recent arterial puncture.  Add H2blocker. Protonix will take a while to really help if the symptoms are indeed GI related.  Sanda Klein, MD, [place] [phone] 08/23/2015, 9:08 AM

## 2015-08-23 NOTE — Progress Notes (Signed)
Inpatient Diabetes Program Recommendations  AACE/ADA: New Consensus Statement on Inpatient Glycemic Control (2013)  Target Ranges:  Prepandial:   less than 140 mg/dL      Peak postprandial:   less than 180 mg/dL (1-2 hours)      Critically ill patients:  140 - 180 mg/dL   Review of glycemic control-hyperglycemia in 200's and 300's HgbA1C of 11.4%  Home meds: Amaryl 2 mg (has been on insulin in the past)   Please add basal lantus to insulin regimen with minimal starting dose of 0.2 units/kg at 18 units to start  Thank you Rosita Kea, RN, MSN, CDE  Diabetes Inpatient Program Office: [phone] Pager: [phone] 8:00 am to 5:00 pm

## 2015-08-23 NOTE — Progress Notes (Signed)
Notified MD about elevated SBP 190-200 and heart rate up to 120's patient  c/o shortness of breath O2 sats 100% RA. Breathing could be possible side effect from Brilinta or elevated heart rate. I did give hydralazine but no  relief with SBP C/o chest pain mid sternal to right arm. Morphine and nitro used to help relieve pain but not pain free at this point. Unable to wean off nitro drip MD agrees to continue drip. Ekg done to review. I will continue to monitor. NSR 96 and BP 167/90 ans 100% 2lnc. Call bell within reach.

## 2015-08-24 DIAGNOSIS — Z9119 Patient's noncompliance with other medical treatment and regimen: Secondary | ICD-10-CM

## 2015-08-24 DIAGNOSIS — E785 Hyperlipidemia, unspecified: Secondary | ICD-10-CM | POA: Diagnosis present

## 2015-08-24 DIAGNOSIS — Z91199 Patient's noncompliance with other medical treatment and regimen due to unspecified reason: Secondary | ICD-10-CM

## 2015-08-24 DIAGNOSIS — I10 Essential (primary) hypertension: Secondary | ICD-10-CM

## 2015-08-24 DIAGNOSIS — E669 Obesity, unspecified: Secondary | ICD-10-CM

## 2015-08-24 DIAGNOSIS — E1165 Type 2 diabetes mellitus with hyperglycemia: Secondary | ICD-10-CM

## 2015-08-24 DIAGNOSIS — I119 Hypertensive heart disease without heart failure: Secondary | ICD-10-CM | POA: Diagnosis present

## 2015-08-24 LAB — GLUCOSE, CAPILLARY
Glucose-Capillary: 269 mg/dL — ABNORMAL HIGH (ref 65–99)
Glucose-Capillary: 321 mg/dL — ABNORMAL HIGH (ref 65–99)

## 2015-08-24 MED ORDER — GLIMEPIRIDE 2 MG PO TABS: 2.0000 mg | ORAL_TABLET | Freq: Every day | ORAL | Status: DC

## 2015-08-24 MED ORDER — ATORVASTATIN CALCIUM 80 MG PO TABS: 80.0000 mg | ORAL_TABLET | Freq: Every day | ORAL | Status: DC

## 2015-08-24 MED ORDER — TICAGRELOR 90 MG PO TABS: 90.0000 mg | ORAL_TABLET | Freq: Two times a day (BID) | ORAL | Status: DC

## 2015-08-24 MED ORDER — METOPROLOL TARTRATE 25 MG PO TABS
100.0000 mg | ORAL_TABLET | Freq: Two times a day (BID) | ORAL | Status: DC
  Administered 2015-08-24: 10:00:00 100 mg via ORAL
  Filled 2015-08-24: qty 4

## 2015-08-24 MED ORDER — NITROGLYCERIN 0.4 MG SL SUBL: 0.4000 mg | SUBLINGUAL_TABLET | SUBLINGUAL | Status: DC | PRN

## 2015-08-24 MED ORDER — ASPIRIN 81 MG PO TBEC: 81.0000 mg | DELAYED_RELEASE_TABLET | Freq: Every day | ORAL | Status: DC

## 2015-08-24 MED ORDER — LISINOPRIL 20 MG PO TABS: 20.0000 mg | ORAL_TABLET | Freq: Every day | ORAL | Status: DC

## 2015-08-24 MED ORDER — METOPROLOL TARTRATE 100 MG PO TABS: 100.0000 mg | ORAL_TABLET | Freq: Two times a day (BID) | ORAL | Status: DC

## 2015-08-24 NOTE — Progress Notes (Signed)
CARDIAC REHAB PHASE I   PRE:  Rate/Rhythm: 115 ST    BP: sitting 161/101 machine, 142/90 manual (but too small cuff)    SaO2:   MODE:  Ambulation: 600 ft   POST:  Rate/Rhythm: 136 ST    BP: sitting 160/94 with manual, too small cuff     SaO2:   Pt sts her BP was taken with machine earlier and were different numbers. Tried manual cuff but no large cuff available. BP prob slightly higher than above. Pt denied CP or the right sided pain she was having yesterday. Feels well. HR high today. Ed completed with pt and daughter. Voiced understanding however seemed to only slightly be listening to diet. Pt was able to perform teach back on Brilinta and has booklet. Will send referral to [place], [place], ACSM 08/24/2015 9:42 AM

## 2015-08-24 NOTE — Progress Notes (Signed)
Subjective:  Feels better, no chest pain  Objective:  Vital Signs in the last 24 hours: Temp:  [97.9 F (36.6 C)-98.6 F (37 C)] 98 F (36.7 C) (09/08 0513) Pulse Rate:  [82-105] 100 (09/08 0513) Resp:  [18-20] 20 (09/08 0513) BP: (148-191)/(77-99) 148/77 mmHg (09/08 0513) SpO2:  [97 %-100 %] 98 % (09/08 0513) Weight:  [194 lb 3.6 oz (88.1 kg)] 194 lb 3.6 oz (88.1 kg) (09/08 0513)  Intake/Output from previous day:  Intake/Output Summary (Last 24 hours) at 08/24/15 0840 Last data filed at 08/23/15 2130  Gross per 24 hour  Intake    960 ml  Output    700 ml  Net    260 ml    Physical Exam: General appearance: alert, cooperative, no distress and moderately obese Lungs: clear to auscultation bilaterally Heart: regular rate and rhythm Extremities: Rt radial and Rt FA site without hematoma Neurologic: Grossly normal   Rate: 115  Rhythm: sinus tachycardia  Lab Results:  Recent Labs  08/22/15 0516 08/23/15 0245  WBC 6.1 7.3  HGB 13.6 14.3  PLT 225 232    Recent Labs  08/22/15 0516 08/23/15 0245  NA 139 139  K 4.3 3.7  CL 104 105  CO2 27 23  GLUCOSE 328* 234*  BUN 12 9  CREATININE 0.81 0.71    Recent Labs  08/23/15 1011  TROPONINI 0.81*    Recent Labs  08/22/15 0516  INR 1.11    Scheduled Meds: . aspirin EC  81 mg Oral Daily  . atorvastatin  80 mg Oral q1800  . famotidine  20 mg Oral BID  . glimepiride  2 mg Oral Q breakfast  . insulin aspart  0-15 Units Subcutaneous TID WC  . lisinopril  20 mg Oral Daily  . metoprolol  100 mg Oral BID  . pantoprazole  40 mg Oral Daily  . polyethylene glycol  17 g Oral Daily  . sodium chloride  3 mL Intravenous Q12H  . sodium chloride  3 mL Intravenous Q12H  . ticagrelor  90 mg Oral BID   Continuous Infusions:  PRN Meds:.sodium chloride, sodium chloride, acetaminophen, ALPRAZolam, hydrALAZINE, morphine injection, nitroGLYCERIN, ondansetron (ZOFRAN) IV, sodium chloride, sodium chloride,  zolpidem   Imaging: Imaging results have been reviewed  Cardiac Studies:  Assessment/Plan:   Principal Problem:   NSTEMI (non-ST elevated myocardial infarction) Active Problems:   CAD -S/P CFX stent '06, RCA DES June 2014- [place] 08/23/15   Type 2 diabetes mellitus, uncontrolled   Hypertension, uncontrolled   Non-compliance with treatment-(cost)   Hypertensive cardiovascular disease   Obesity- BMI 36   Dyslipidemia   PLAN: Discharge today after Care Manager consult to arrange medications and OP f/u with free clinic. We will arrange for TOC OV in 7-10  Days. Increase Metoprolol to 100 mg BID, no work till seen in f/u.   Kerin Ransom PA-C 08/24/2015, 8:40 AM [phone]  I have seen and examined the patient along with Kerin Ransom PA-C.  I have reviewed the chart, notes and new data.  I agree with PA's note.  Key new complaints: pain has resolved completely, as has her dysphagia Key examination changes: mild tachycardia persists, without physical evidence of infection, bleeding or heart failure   PLAN: Tachycardia may be due in part to poorly controlled DM, autonomic neuropathy? BP improved, not yet perfect, but so many recent changes made to meds will have to wait for further titration. Social issues leading to poor compliance are one of  the big threats to her short and long term prognosis. Stressed absolutely critical need for compliance with DAPT, also need to achieve good and sustained control of BP and glycemia. SW evaluation. Needs a short supply of meds until she can go to the free clinic. 30 days free Brilinta card provided. Early Cardiology f/u (TOC) arranged.  Sanda Klein, MD, [place] [phone] 08/24/2015, 9:17 AM

## 2015-08-24 NOTE — Progress Notes (Signed)
Patient ID: [name],  MRN: [id number], DOB/AGE: [birth year] 59 y.o.  Admit date: 08/18/2015 Discharge date: 08/24/2015  Primary Care Provider: No PCP Per Patient Primary Cardiologist: Dr Sallyanne Kuster  Discharge Diagnoses Principal Problem:   NSTEMI (non-ST elevated myocardial infarction) Active Problems:   CAD -S/P CFX stent '06, RCA DES June 2014- [place] 08/23/15   Type 2 diabetes mellitus, uncontrolled   Hypertension, uncontrolled   Non-compliance with treatment-(cost)   Hypertensive cardiovascular disease   Obesity- BMI 36   Dyslipidemia    Procedures: Cath/ LAD and RCA DES 08/22/15                        Re look cath for chest pain later same day-[place]   Hospital Course:  59 year old woman with history of hypertension, hyperlipidemia, diabetes, obesity and known CAD. CAD history began in 2006 where she had a stent placed in her circumflex. She then had acute coronary syndrome in June 2014 underwent PCI to the RCA. At that time she noted a patent stent in the circumflex with severe disease in OM1 and OM 2 as well as the PDA. Overall too small for PCI. She did well for about a year and then apparently because of financial issues abruptly stop all of her medications about a year ago. She Presented 08/18/15 with unstable angina/non-ST elevation MI with Troponin peak of 1.35. She underwent Two-vessel PCI of the LAD and RCA (new sites) with DES stents on 08/22/15 post cath she had severe 10/10 chest pain. She was noted to be profoundly hypertensive. Pain did improve with improvement of her blood pressure but she still persisted and 5 out of 10 pain. She went for re-look catheterization which revealed widely patent LAD and RCA stents. Dr Ellyn Hack felt her chest pain was from uncontrolled HTN and her medications were adjusted. She was seen by Dr Sallyanne Kuster the morning of the 8 th and was feeling better. She was still tachycardic and her Lopressor was increased. We have asked for Care Manager to see before  discharge to arrange OP f/u with primary care and to make sure pt has medications. She will need a 7-10 day TOC f/u. She works at [employer] and should be out of work till we see her back.   Discharge Vitals:  Blood pressure 148/77, pulse 100, temperature 98 F (36.7 C), temperature source Oral, resp. rate 20, height 5\' 2"  (1.575 m), weight 194 lb 3.6 oz (88.1 kg), SpO2 98 %.    Labs: Results for orders placed or performed during the hospital encounter of 08/18/15 (from the past 24 hour(s))  Troponin I     Status: Abnormal   Collection Time: 08/23/15 10:11 AM  Result Value Ref Range   Troponin I 0.81 (HH) <0.031 ng/mL  Glucose, capillary     Status: Abnormal   Collection Time: 08/23/15 12:21 PM  Result Value Ref Range   Glucose-Capillary 245 (H) 65 - 99 mg/dL  Glucose, capillary     Status: Abnormal   Collection Time: 08/23/15  5:24 PM  Result Value Ref Range   Glucose-Capillary 245 (H) 65 - 99 mg/dL  Glucose, capillary     Status: Abnormal   Collection Time: 08/23/15  9:48 PM  Result Value Ref Range   Glucose-Capillary 259 (H) 65 - 99 mg/dL   Comment 1 Notify RN    Comment 2 Document in Chart   Glucose, capillary     Status: Abnormal  Collection Time: 08/24/15  6:34 AM  Result Value Ref Range   Glucose-Capillary 269 (H) 65 - 99 mg/dL    Disposition:      Follow-up Information    Follow up with [facility] On 08/29/2015.   Why:  Hospital Follow Up @ 3:00 pm.   Contact information:   [address] [place] [address] [phone]      Go to [facility]    .   Why:  Pharmacy location: Medications can be obtained and Brilinta is avaialbe. Please ask for assistance with the PASS Program for Medication assitance with Brilinta. Patient Assistance Application to be filled out at the Pharmacy.    Contact information:   [address]  [place] [address] [phone]       Discharge Medications:    Medication List    TAKE these medications        acetaminophen 500 MG tablet  Commonly known as:  TYLENOL  Take 1,000 mg by mouth daily as needed for moderate pain.     aspirin 81 MG EC tablet  Take 1 tablet (81 mg total) by mouth daily.     atorvastatin 80 MG tablet  Commonly known as:  LIPITOR  Take 1 tablet (80 mg total) by mouth daily at 6 PM.     glimepiride 2 MG tablet  Commonly known as:  AMARYL  Take 1 tablet (2 mg total) by mouth daily with breakfast.     lisinopril 20 MG tablet  Commonly known as:  PRINIVIL,ZESTRIL  Take 1 tablet (20 mg total) by mouth daily.     metoprolol 100 MG tablet  Commonly known as:  LOPRESSOR  Take 1 tablet (100 mg total) by mouth 2 (two) times daily.     nitroGLYCERIN 0.4 MG SL tablet  Commonly known as:  NITROSTAT  Place 1 tablet (0.4 mg total) under the tongue every 5 (five) minutes as needed for chest pain.     ranitidine 150 MG capsule  Commonly known as:  ZANTAC  Take 150 mg by mouth daily as needed for heartburn.     ticagrelor 90 MG Tabs tablet  Commonly known as:  BRILINTA  Take 1 tablet (90 mg total) by mouth 2 (two) times daily.         Duration of Discharge Encounter: Greater than 30 minutes including physician time.  Angelena Form PA-C 08/24/2015 8:59 AM

## 2015-08-24 NOTE — Discharge Summary (Signed)
Patient ID: [name],  MRN: [id number], DOB/AGE: [id number] 57 y.o.  Admit date: 08/18/2015 Discharge date: 08/24/2015  Primary Care Provider: No PCP Per Patient Primary Cardiologist: Dr Sallyanne Kuster  Discharge Diagnoses Principal Problem:   NSTEMI (non-ST elevated myocardial infarction) Active Problems:   CAD -S/P CFX stent '06, RCA DES June 2014- OK 08/23/15   Type 2 diabetes mellitus, uncontrolled   Hypertension, uncontrolled   Non-compliance with treatment-(cost)   Hypertensive cardiovascular disease   Obesity- BMI 36   Dyslipidemia    Procedures: Cath/ LAD and RCA DES 08/22/15                        Re look cath for chest pain later same day-OK   Hospital Course:  59 year old woman with history of hypertension, hyperlipidemia, diabetes, obesity and known CAD. CAD history began in 2006 where she had a stent placed in her circumflex. She then had acute coronary syndrome in June 2014 underwent PCI to the RCA. At that time she noted a patent stent in the circumflex with severe disease in OM1 and OM 2 as well as the PDA. Overall too small for PCI. She did well for about a year and then apparently because of financial issues abruptly stop all of her medications about a year ago. She Presented 08/18/15 with unstable angina/non-ST elevation MI with Troponin peak of 1.35. She underwent Two-vessel PCI of the LAD and RCA (new sites) with DES stents on 08/22/15 post cath she had severe 10/10 chest pain. She was noted to be profoundly hypertensive. Pain did improve with improvement of her blood pressure but she still persisted and 5 out of 10 pain. She went for re-look catheterization which revealed widely patent LAD and RCA stents. Dr Ellyn Hack felt her chest pain was from uncontrolled HTN and her medications were adjusted. She was seen by Dr Sallyanne Kuster the morning of the 8 th and was feeling better. She was still tachycardic and her Lopressor was increased. We have asked for Care Manager to see before  discharge to arrange OP f/u with primary care and to make sure pt has medications. She will need a 7-10 day TOC f/u. She works at [employer] and should be out of work till we see her back.   Discharge Vitals:  Blood pressure 148/77, pulse 100, temperature 98 F (36.7 C), temperature source Oral, resp. rate 20, height 5\' 2"  (1.575 m), weight 194 lb 3.6 oz (88.1 kg), SpO2 98 %.    Labs: Results for orders placed or performed during the hospital encounter of 08/18/15 (from the past 24 hour(s))  Troponin I     Status: Abnormal   Collection Time: 08/23/15 10:11 AM  Result Value Ref Range   Troponin I 0.81 (HH) <0.031 ng/mL  Glucose, capillary     Status: Abnormal   Collection Time: 08/23/15 12:21 PM  Result Value Ref Range   Glucose-Capillary 245 (H) 65 - 99 mg/dL  Glucose, capillary     Status: Abnormal   Collection Time: 08/23/15  5:24 PM  Result Value Ref Range   Glucose-Capillary 245 (H) 65 - 99 mg/dL  Glucose, capillary     Status: Abnormal   Collection Time: 08/23/15  9:48 PM  Result Value Ref Range   Glucose-Capillary 259 (H) 65 - 99 mg/dL   Comment 1 Notify RN    Comment 2 Document in Chart   Glucose, capillary     Status: Abnormal  Collection Time: 08/24/15  6:34 AM  Result Value Ref Range   Glucose-Capillary 269 (H) 65 - 99 mg/dL    Disposition:      Follow-up Information    Follow up with [facility] On 08/29/2015.   Why:  Hospital Follow Up @ 3:00 pm.   Contact information:   [address] [place] [address] [phone]      Go to [facility]    .   Why:  Pharmacy location: Medications can be obtained and Brilinta is avaialbe. Please ask for assistance with the PASS Program for Medication assitance with Brilinta. Patient Assistance Application to be filled out at the Pharmacy.    Contact information:   [address] [place] [address] [phone]       Discharge Medications:    Medication List    TAKE these medications        acetaminophen 500 MG tablet  Commonly known as:  TYLENOL  Take 1,000 mg by mouth daily as needed for moderate pain.     aspirin 81 MG EC tablet  Take 1 tablet (81 mg total) by mouth daily.     atorvastatin 80 MG tablet  Commonly known as:  LIPITOR  Take 1 tablet (80 mg total) by mouth daily at 6 PM.     glimepiride 2 MG tablet  Commonly known as:  AMARYL  Take 1 tablet (2 mg total) by mouth daily with breakfast.     lisinopril 20 MG tablet  Commonly known as:  PRINIVIL,ZESTRIL  Take 1 tablet (20 mg total) by mouth daily.     metoprolol 100 MG tablet  Commonly known as:  LOPRESSOR  Take 1 tablet (100 mg total) by mouth 2 (two) times daily.     nitroGLYCERIN 0.4 MG SL tablet  Commonly known as:  NITROSTAT  Place 1 tablet (0.4 mg total) under the tongue every 5 (five) minutes as needed for chest pain.     ranitidine 150 MG capsule  Commonly known as:  ZANTAC  Take 150 mg by mouth daily as needed for heartburn.     ticagrelor 90 MG Tabs tablet  Commonly known as:  BRILINTA  Take 1 tablet (90 mg total) by mouth 2 (two) times daily.         Duration of Discharge Encounter: Greater than 30 minutes including physician time.  Angelena Form PA-C 08/24/2015 8:59 AM

## 2015-08-24 NOTE — Discharge Instructions (Signed)
Arteriogram ° An arteriogram (or angiogram) is an X-ray test of your blood vessels. You will be awake during the test. This test looks for: °· Blocked blood vessels. °· Blood vessels that are not normal. °BEFORE THE TEST °Schedule an appointment for your arteriogram. Let the person know if: °· You have diabetes. °· You are allergic to any food or medicine. °· You are allergic to X-ray dye (contrast). °· You have asthma or had it when you were a child. °· You are pregnant or you might be pregnant. °· You are taking aspirin or blood thinners. °The night before the test:  °· Do not  eat or drink anything after midnight. °On the morning of your test:  °· Do not drive. Have someone bring you and take you home. °· Bring all the medicines you need to take with you. If you have diabetes, do not take your insulin before the test, but bring it with you. °THE TEST °· You will lie on the X-ray table. °· An IV tube is started in your arm. °· Your blood pressure and heartbeat are checked during the test. °· The upper part of your leg (groin) is shaved and washed with special soap. °· You are then covered with a germ free (sterile) sheet. Keep your arms at your side under the sheet at all times so that germs do not get on the sheet. °· The skin is numbed where the thin tube (catheter) is put into your upper leg. The thin tube is moved up into the blood vessels that your doctor wants to see. °· Dye is put in through the tube. You may have a feeling of heat as the dye moves into your body. °· X-ray pictures of your blood vessels are taken. Do not move while the dye goes in and pictures are taken. °AFTER THE TEST °· The thin tube will be taken out of your upper leg. °· The nurse will check: °¨ Your blood pressure. °¨ The place where the thin tube was taken out to make sure it is not bleeding. °¨ The blood flow to your feet. °· Lie flat in bed. Keep your leg straight for 6 hours after the thin tube is taken out. °· Ask your doctor or  nurse if you should take your regular medications that you brought. °Finding out the results of your test °Ask when your test results will be ready. Make sure you get your test results. °Document Released: 02/28/2009 Document Revised: 12/07/2013 Document Reviewed: 02/28/2009 °ExitCare® Patient Information ©2015 [employer]. This information is not intended to replace advice given to you by your health care provider. Make sure you discuss any questions you have with your health care provider. ° °

## 2015-08-25 ENCOUNTER — Telehealth: Payer: Self-pay | Admitting: Cardiovascular Disease

## 2015-08-25 NOTE — Telephone Encounter (Signed)
D/C phone call .Marland Kitchen appt on 09/05/15 at 8:30am w/Luke Kilroy at [facility] .Marland Kitchen   Thanks

## 2015-08-25 NOTE — Telephone Encounter (Signed)
Patient contacted regarding discharge from [facility] on 08/24/15.  Patient understands to follow up with provider Oswaldo Done, PA on 09/05/15 at 0830 at [facility]. Patient understands discharge instructions? YES Patient understands medications and regiment? YES Patient understands to bring all medications to this visit? YES

## 2015-08-26 ENCOUNTER — Encounter (HOSPITAL_COMMUNITY): Payer: Self-pay | Admitting: Emergency Medicine

## 2015-08-26 ENCOUNTER — Emergency Department (HOSPITAL_COMMUNITY): Payer: Self-pay

## 2015-08-26 ENCOUNTER — Observation Stay (HOSPITAL_COMMUNITY)
Admission: EM | Admit: 2015-08-26 | Discharge: 2015-08-27 | Disposition: A | Discharge status: Home / Self Care | Payer: Self-pay | Attending: Internal Medicine | Admitting: Internal Medicine

## 2015-08-26 DIAGNOSIS — M1990 Unspecified osteoarthritis, unspecified site: Secondary | ICD-10-CM | POA: Insufficient documentation

## 2015-08-26 DIAGNOSIS — I214 Non-ST elevation (NSTEMI) myocardial infarction: Secondary | ICD-10-CM

## 2015-08-26 DIAGNOSIS — Z7982 Long term (current) use of aspirin: Secondary | ICD-10-CM | POA: Insufficient documentation

## 2015-08-26 DIAGNOSIS — E119 Type 2 diabetes mellitus without complications: Secondary | ICD-10-CM | POA: Insufficient documentation

## 2015-08-26 DIAGNOSIS — Z79899 Other long term (current) drug therapy: Secondary | ICD-10-CM | POA: Insufficient documentation

## 2015-08-26 DIAGNOSIS — R0602 Shortness of breath: Secondary | ICD-10-CM | POA: Insufficient documentation

## 2015-08-26 DIAGNOSIS — Z955 Presence of coronary angioplasty implant and graft: Secondary | ICD-10-CM | POA: Insufficient documentation

## 2015-08-26 DIAGNOSIS — I252 Old myocardial infarction: Secondary | ICD-10-CM | POA: Insufficient documentation

## 2015-08-26 DIAGNOSIS — Z87891 Personal history of nicotine dependence: Secondary | ICD-10-CM | POA: Insufficient documentation

## 2015-08-26 DIAGNOSIS — K219 Gastro-esophageal reflux disease without esophagitis: Secondary | ICD-10-CM | POA: Insufficient documentation

## 2015-08-26 DIAGNOSIS — E785 Hyperlipidemia, unspecified: Secondary | ICD-10-CM | POA: Insufficient documentation

## 2015-08-26 DIAGNOSIS — I2510 Atherosclerotic heart disease of native coronary artery without angina pectoris: Secondary | ICD-10-CM | POA: Insufficient documentation

## 2015-08-26 DIAGNOSIS — R7989 Other specified abnormal findings of blood chemistry: Secondary | ICD-10-CM

## 2015-08-26 DIAGNOSIS — R778 Other specified abnormalities of plasma proteins: Secondary | ICD-10-CM

## 2015-08-26 DIAGNOSIS — E669 Obesity, unspecified: Secondary | ICD-10-CM | POA: Insufficient documentation

## 2015-08-26 DIAGNOSIS — R0789 Other chest pain: Principal | ICD-10-CM | POA: Insufficient documentation

## 2015-08-26 DIAGNOSIS — I10 Essential (primary) hypertension: Secondary | ICD-10-CM | POA: Insufficient documentation

## 2015-08-26 LAB — I-STAT TROPONIN, ED: TROPONIN I, POC: 0.92 ng/mL — AB (ref 0.00–0.08)

## 2015-08-26 LAB — BASIC METABOLIC PANEL
ANION GAP: 11 (ref 5–15)
BUN: 22 mg/dL — ABNORMAL HIGH (ref 6–20)
CHLORIDE: 101 mmol/L (ref 101–111)
CO2: 20 mmol/L — AB (ref 22–32)
Calcium: 9.1 mg/dL (ref 8.9–10.3)
Creatinine, Ser: 0.88 mg/dL (ref 0.44–1.00)
GFR calc non Af Amer: >60 mL/min (ref >60)
Glucose, Bld: 316 mg/dL — ABNORMAL HIGH (ref 65–99)
POTASSIUM: 4.4 mmol/L (ref 3.5–5.1)
Sodium: 132 mmol/L — ABNORMAL LOW (ref 135–145)

## 2015-08-26 LAB — CBC
HEMATOCRIT: 39.2 % (ref 36.0–46.0)
HEMOGLOBIN: 13.6 g/dL (ref 12.0–15.0)
MCH: 30.8 pg (ref 26.0–34.0)
MCHC: 34.7 g/dL (ref 30.0–36.0)
MCV: 88.9 fL (ref 78.0–100.0)
Platelets: 257 10*3/uL (ref 150–400)
RBC: 4.41 MIL/uL (ref 3.87–5.11)
RDW: 12.9 % (ref 11.5–15.5)
WBC: 8.7 10*3/uL (ref 4.0–10.5)

## 2015-08-26 LAB — TROPONIN I
TROPONIN I: 0.86 ng/mL — AB (ref <0.031)
TROPONIN I: 0.76 ng/mL — AB (ref <0.031)
Troponin I: 0.82 ng/mL (ref <0.031)

## 2015-08-26 LAB — CK TOTAL AND CKMB (NOT AT ARMC)
CK TOTAL: 79 U/L (ref 38–234)
CK, MB: 2.5 ng/mL (ref 0.5–5.0)
Relative Index: INVALID (ref 0.0–2.5)

## 2015-08-26 LAB — HEPARIN LEVEL (UNFRACTIONATED): Heparin Unfractionated: 0.34 IU/mL (ref 0.30–0.70)

## 2015-08-26 LAB — GLUCOSE, CAPILLARY
GLUCOSE-CAPILLARY: 105 mg/dL — AB (ref 65–99)
Glucose-Capillary: 295 mg/dL — ABNORMAL HIGH (ref 65–99)

## 2015-08-26 LAB — CBG MONITORING, ED
Glucose-Capillary: 253 mg/dL — ABNORMAL HIGH (ref 65–99)
Glucose-Capillary: 247 mg/dL — ABNORMAL HIGH (ref 65–99)

## 2015-08-26 MED ORDER — HEPARIN (PORCINE) IN NACL 100-0.45 UNIT/ML-% IJ SOLN
1250.0000 [IU]/h | INTRAMUSCULAR | Status: DC
  Administered 2015-08-26 – 2015-08-27 (×2): 1250 [IU]/h via INTRAVENOUS
  Filled 2015-08-26 (×4): qty 250

## 2015-08-26 MED ORDER — TICAGRELOR 90 MG PO TABS
90.0000 mg | ORAL_TABLET | Freq: Two times a day (BID) | ORAL | Status: DC
  Administered 2015-08-26 – 2015-08-27 (×3): 90 mg via ORAL
  Filled 2015-08-26 (×5): qty 1

## 2015-08-26 MED ORDER — HEPARIN BOLUS VIA INFUSION
3000.0000 [IU] | Freq: Once | INTRAVENOUS | Status: AC
  Administered 2015-08-26: 3000 [IU] via INTRAVENOUS
  Filled 2015-08-26: qty 3000

## 2015-08-26 MED ORDER — NITROGLYCERIN 0.4 MG SL SUBL: 0.4000 mg | SUBLINGUAL_TABLET | SUBLINGUAL | Status: DC | PRN

## 2015-08-26 MED ORDER — ONDANSETRON HCL 4 MG/2ML IJ SOLN: 4.0000 mg | Freq: Four times a day (QID) | INTRAMUSCULAR | Status: DC | PRN

## 2015-08-26 MED ORDER — LISINOPRIL 20 MG PO TABS
20.0000 mg | ORAL_TABLET | Freq: Every day | ORAL | Status: DC
  Administered 2015-08-26 – 2015-08-27 (×2): 20 mg via ORAL
  Filled 2015-08-26: qty 1
  Filled 2015-08-26: qty 2
  Filled 2015-08-26: qty 1

## 2015-08-26 MED ORDER — METOPROLOL TARTRATE 100 MG PO TABS
100.0000 mg | ORAL_TABLET | Freq: Two times a day (BID) | ORAL | Status: DC
  Administered 2015-08-26 – 2015-08-27 (×3): 100 mg via ORAL
  Filled 2015-08-26 (×4): qty 1
  Filled 2015-08-26: qty 4

## 2015-08-26 MED ORDER — ASPIRIN EC 81 MG PO TBEC
81.0000 mg | DELAYED_RELEASE_TABLET | Freq: Every day | ORAL | Status: DC
  Administered 2015-08-26 – 2015-08-27 (×2): 81 mg via ORAL
  Filled 2015-08-26 (×3): qty 1

## 2015-08-26 MED ORDER — ACETAMINOPHEN 500 MG PO TABS
1000.0000 mg | ORAL_TABLET | Freq: Every day | ORAL | Status: DC | PRN
Start: 2015-08-26 — End: 2015-08-27
  Administered 2015-08-26: 1000 mg via ORAL
  Filled 2015-08-26: qty 2

## 2015-08-26 MED ORDER — INSULIN ASPART 100 UNIT/ML ~~LOC~~ SOLN: 0.0000 [IU] | Freq: Every day | SUBCUTANEOUS | Status: DC

## 2015-08-26 MED ORDER — ISOSORBIDE MONONITRATE ER 30 MG PO TB24
30.0000 mg | ORAL_TABLET | Freq: Every day | ORAL | Status: DC
  Administered 2015-08-26 – 2015-08-27 (×2): 30 mg via ORAL
  Filled 2015-08-26 (×3): qty 1

## 2015-08-26 MED ORDER — NITROGLYCERIN 0.4 MG SL SUBL
0.4000 mg | SUBLINGUAL_TABLET | SUBLINGUAL | Status: DC | PRN
  Administered 2015-08-26: 0.4 mg via SUBLINGUAL
  Filled 2015-08-26: qty 1

## 2015-08-26 MED ORDER — ACETAMINOPHEN 325 MG PO TABS: 650.0000 mg | ORAL_TABLET | ORAL | Status: DC | PRN

## 2015-08-26 MED ORDER — ATORVASTATIN CALCIUM 80 MG PO TABS
80.0000 mg | ORAL_TABLET | Freq: Every day | ORAL | Status: DC
  Administered 2015-08-26: 80 mg via ORAL
  Filled 2015-08-26 (×2): qty 1

## 2015-08-26 MED ORDER — INSULIN ASPART 100 UNIT/ML ~~LOC~~ SOLN
0.0000 [IU] | Freq: Three times a day (TID) | SUBCUTANEOUS | Status: DC
  Administered 2015-08-26 (×2): 5 [IU] via SUBCUTANEOUS
  Administered 2015-08-26: 8 [IU] via SUBCUTANEOUS
  Administered 2015-08-27: 5 [IU] via SUBCUTANEOUS
  Administered 2015-08-27: 8 [IU] via SUBCUTANEOUS
  Filled 2015-08-26: qty 1

## 2015-08-26 NOTE — ED Notes (Signed)
Ordered [employer] lunch tray

## 2015-08-26 NOTE — ED Provider Notes (Signed)
CSN: [id number]     Arrival date & time 08/26/15  4098 History  This chart was scribed for Varney Biles, MD by Randa Evens, ED Scribe. This patient was seen in room A05C/A05C and the patient's care was started at 3:38 AM.     Chief Complaint  Patient presents with  . Chest Pain   Patient is a 59 y.o. female presenting with chest pain. The history is provided by the patient. No language interpreter was used.  Chest Pain Associated symptoms: diaphoresis, nausea and shortness of breath   Associated symptoms: not vomiting    HPI Comments: [name] is a 59 y.o. female with PMhx of CAD brought in by ambulance, who presents to the Emergency status post PCI 4 days prior complaining of sharp central CP that began 2 hours PTA, that woke her from her sleep. She states she has associated diaphoresis, nausea and SOB. Pt states that the pan radiated to her right arm. She states that she took 4 baby ASA and 2 nitroglycerin that provided relief. Pt states that this pain was similar to her previous pain when she had the stents placed.    Past Medical History  Diagnosis Date  . Diabetes mellitus without complication   . Heart attack   . GERD (gastroesophageal reflux disease)   . Hyperlipidemia LDL goal < 70 06/07/2013  . Coronary atherosclerosis of native coronary artery, with stent to LCX in 2006 06/04/2013  . S/P coronary artery stent placement to RCA with PROMUS DES, 06/07/13, residual disease on OM1, OM2 and rPDA 06/07/2013  . Anginal pain   . Hypertension   . Arthritis    Past Surgical History  Procedure Laterality Date  . Coronary stent placement  approx 2005    1 stent  . Left heart catheterization with coronary angiogram N/A 06/07/2013    Procedure: LEFT HEART CATHETERIZATION WITH CORONARY ANGIOGRAM;  Surgeon: Sanda Klein, MD;  Location: [facility];  Service: Cardiovascular;  Laterality: N/A;  . Cardiac catheterization N/A 08/22/2015    Procedure: Left Heart Cath and Coronary  Angiography;  Surgeon: Leonie Man, MD;  Location: [facility];  Service: Cardiovascular;  Laterality: N/A;  . Cardiac catheterization N/A 08/22/2015    Procedure: Coronary Stent Intervention;  Surgeon: Leonie Man, MD;  Location: [facility];  Service: Cardiovascular;  Laterality: N/A;  . Cardiac catheterization N/A 08/22/2015    Procedure: Left Heart Cath and Coronary Angiography;  Surgeon: Leonie Man, MD;  Location: [facility];  Service: Cardiovascular;  Laterality: N/A;   No family history on file. Social History  Substance Use Topics  . Smoking status: Former Smoker -- 25 years    Quit date: 07/03/2004  . Smokeless tobacco: Never Used  . Alcohol Use: Yes     Comment: ocassionally    OB History    No data available     Review of Systems  Constitutional: Positive for diaphoresis.  Respiratory: Positive for shortness of breath.   Cardiovascular: Positive for chest pain.  Gastrointestinal: Positive for nausea. Negative for vomiting.  All other systems reviewed and are negative.    Allergies  Review of patient's allergies indicates no known allergies.  Home Medications   Prior to Admission medications   Medication Sig Start Date End Date Taking? Authorizing Provider  acetaminophen (TYLENOL) 500 MG tablet Take 1,000 mg by mouth daily as needed for moderate pain.    Yes Historical Provider, MD  aspirin EC 81 MG  EC tablet Take 1 tablet (81 mg total) by mouth daily. 08/24/15  Yes Erlene Quan, PA-C  atorvastatin (LIPITOR) 80 MG tablet Take 1 tablet (80 mg total) by mouth daily at 6 PM. 08/24/15  Yes Erlene Quan, PA-C  glimepiride (AMARYL) 2 MG tablet Take 1 tablet (2 mg total) by mouth daily with breakfast. 08/24/15  Yes Erlene Quan, PA-C  lisinopril (PRINIVIL,ZESTRIL) 20 MG tablet Take 1 tablet (20 mg total) by mouth daily. 08/24/15  Yes Luke K Kilroy, PA-C  metoprolol tartrate (LOPRESSOR) 100 MG tablet Take 1 tablet (100 mg total) by mouth 2 (two)  times daily. 08/24/15  Yes Luke K Kilroy, PA-C  nitroGLYCERIN (NITROSTAT) 0.4 MG SL tablet Place 1 tablet (0.4 mg total) under the tongue every 5 (five) minutes as needed for chest pain. 08/24/15  Yes Luke K Kilroy, PA-C  ranitidine (ZANTAC) 150 MG capsule Take 150 mg by mouth daily as needed for heartburn.   Yes Historical Provider, MD  ticagrelor (BRILINTA) 90 MG TABS tablet Take 1 tablet (90 mg total) by mouth 2 (two) times daily. 08/24/15  Yes Luke K Kilroy, PA-C  isosorbide mononitrate (IMDUR) 30 MG 24 hr tablet Take 1 tablet (30 mg total) by mouth daily. 08/27/15   Brittainy M Simmons, PA-C   BP 160/85 mmHg  Pulse 92  Temp(Src) 98.4 F (36.9 C) (Oral)  Resp 18  Ht 5\' 2"  (1.575 m)  Wt 196 lb 11.2 oz (89.223 kg)  BMI 35.97 kg/m2  SpO2 100%   Physical Exam  Constitutional: She appears well-developed and well-nourished.  HENT:  Head: Normocephalic and atraumatic.  Eyes: Conjunctivae are normal. Right eye exhibits no discharge. Left eye exhibits no discharge.  Cardiovascular: Normal rate and regular rhythm.   Pulses:      Radial pulses are 2+ on the right side, and 2+ on the left side.  Pulmonary/Chest: Effort normal. No respiratory distress.  Lungs clear to auscultation.   Abdominal: Soft.  Musculoskeletal: She exhibits no edema.  No lower extremity swelling or pitting edema.   Neurological: She is alert. Coordination normal.  Skin: Skin is warm and dry. No rash noted. She is not diaphoretic. No erythema.  Psychiatric: She has a normal mood and affect.  Nursing note and vitals reviewed.   ED Course  Procedures (including critical care time) DIAGNOSTIC STUDIES: Oxygen Saturation is 100% on RA, normal by my interpretation.    COORDINATION OF CARE: 4:07 AM-Discussed treatment plan with pt at bedside and pt agreed to plan.     Labs Review Labs Reviewed  BASIC METABOLIC PANEL - Abnormal; Notable for the following:    Sodium 132 (*)    CO2 20 (*)    Glucose, Bld 316 (*)    BUN  22 (*)    All other components within normal limits  TROPONIN I - Abnormal; Notable for the following:    Troponin I 0.86 (*)    All other components within normal limits  TROPONIN I - Abnormal; Notable for the following:    Troponin I 0.76 (*)    All other components within normal limits  TROPONIN I - Abnormal; Notable for the following:    Troponin I 0.82 (*)    All other components within normal limits  TROPONIN I - Abnormal; Notable for the following:    Troponin I 0.79 (*)    All other components within normal limits  GLUCOSE, CAPILLARY - Abnormal; Notable for the following:    Glucose-Capillary 295 (*)  All other components within normal limits  BASIC METABOLIC PANEL - Abnormal; Notable for the following:    Glucose, Bld 255 (*)    All other components within normal limits  GLUCOSE, CAPILLARY - Abnormal; Notable for the following:    Glucose-Capillary 105 (*)    All other components within normal limits  GLUCOSE, CAPILLARY - Abnormal; Notable for the following:    Glucose-Capillary 248 (*)    All other components within normal limits  GLUCOSE, CAPILLARY - Abnormal; Notable for the following:    Glucose-Capillary 266 (*)    All other components within normal limits  I-STAT TROPOININ, ED - Abnormal; Notable for the following:    Troponin i, poc 0.92 (*)    All other components within normal limits  CBG MONITORING, ED - Abnormal; Notable for the following:    Glucose-Capillary 253 (*)    All other components within normal limits  CBG MONITORING, ED - Abnormal; Notable for the following:    Glucose-Capillary 247 (*)    All other components within normal limits  CBC  CK TOTAL AND CKMB (NOT AT [facility])  HEPARIN LEVEL (UNFRACTIONATED)  TSH  T4, FREE  BRAIN NATRIURETIC PEPTIDE  HEPARIN LEVEL (UNFRACTIONATED)  CBC  LIPID PANEL  PROTIME-INR  HEMOGLOBIN A1C    Imaging Review No results found.    EKG Interpretation   Date/Time:  Saturday August 26 2015 03:30:00  EDT Ventricular Rate:  66 PR Interval:  130 QRS Duration: 79 QT Interval:  450 QTC Calculation: 471 R Axis:   82 Text Interpretation:  Sinus rhythm Abnormal T, consider ischemia, lateral  leads Nonspecific ST and T wave abnormality No acute changes Confirmed by  Kathrynn Humble, MD, Thelma Comp [phone]) on 08/26/2015 4:22:01 AM      MDM   Final diagnoses:  Elevated troponin    Medical screening examination/treatment/procedure(s) were performed by me as the supervising physician. Scribe service was utilized for documentation only.  Pt with hypertension, hyperlipidemia, diabetes, obesity and CAD with several stents and s/p 2 more stents just few days back comes in with chest pain that woke her up from her sleep and is typical of her ACS pain. She responded to nitro SL x 2 at home and then 1 more per EMS. Currently pain free with the EKG showing no acute changes. She took full dose aspirin at home. Will admit for chest pain obs given such a close proximity to DES placement.     Varney Biles, MD 08/28/15 [phone]

## 2015-08-26 NOTE — H&P (Signed)
Patient ID: NATHALEE SMARR MRN: [id number], DOB/AGE: 57-Apr-1957   Admit date: 08/26/2015   Primary Physician: No PCP Per Patient Primary Cardiologist: Appointment Pending  Pt. Profile:  Pt is a 59 yo F w/ a PMH significant for 3vCAD, DMII, and arthritis p/w CP.  Problem List  Past Medical History  Diagnosis Date  . Diabetes mellitus without complication   . Heart attack   . GERD (gastroesophageal reflux disease)   . Hyperlipidemia LDL goal < 70 06/07/2013  . Coronary atherosclerosis of native coronary artery, with stent to LCX in 2006 06/04/2013  . S/P coronary artery stent placement to RCA with PROMUS DES, 06/07/13, residual disease on OM1, OM2 and rPDA 06/07/2013  . Anginal pain   . Hypertension   . Arthritis     Past Surgical History  Procedure Laterality Date  . Coronary stent placement  approx 2005    1 stent  . Left heart catheterization with coronary angiogram N/A 06/07/2013    Procedure: LEFT HEART CATHETERIZATION WITH CORONARY ANGIOGRAM;  Surgeon: Sanda Klein, MD;  Location: [place] [facility] [facility];  Service: Cardiovascular;  Laterality: N/A;  . Cardiac catheterization N/A 08/22/2015    Procedure: Left Heart Cath and Coronary Angiography;  Surgeon: Leonie Man, MD;  Location: [place] [facility];  Service: Cardiovascular;  Laterality: N/A;  . Cardiac catheterization N/A 08/22/2015    Procedure: Coronary Stent Intervention;  Surgeon: Leonie Man, MD;  Location: [place] [facility];  Service: Cardiovascular;  Laterality: N/A;  . Cardiac catheterization N/A 08/22/2015    Procedure: Left Heart Cath and Coronary Angiography;  Surgeon: Leonie Man, MD;  Location: [facility] [place] [facility];  Service: Cardiovascular;  Laterality: N/A;    Allergies  No Known Allergies  HPI  Per d/c summary 08/24/2015:  "59 year old woman with history of hypertension, hyperlipidemia, diabetes, obesity and known CAD. CAD history began in 2006 where she had a stent placed in her circumflex. She then had  acute coronary syndrome in June 2014 underwent PCI to the RCA. At that time she noted a patent stent in the circumflex with severe disease in OM1 and OM 2 as well as the PDA. Overall too small for PCI. She did well for about a year and then apparently because of financial issues abruptly stop all of her medications about a year ago. She Presented 08/18/15 with unstable angina/non-ST elevation MI with Troponin peak of 1.35. She underwent Two-vessel PCI of the LAD and RCA (new sites) with DES stents on 08/22/15 post cath she had severe 10/10 chest pain. She was noted to be profoundly hypertensive. Pain did improve with improvement of her blood pressure but she still persisted and 5 out of 10 pain. She went for re-look catheterization which revealed widely patent LAD and RCA stents. Dr Ellyn Hack felt her chest pain was from uncontrolled HTN and her medications were adjusted. She was seen by Dr Sallyanne Kuster the morning of the 8 th and was feeling better. She was still tachycardic and her Lopressor was increased. We have asked for Care Manager to see before discharge to arrange OP f/u with primary care and to make sure pt has medications. She will need a 7-10 day TOC f/u. She works at a [employer] and should be out of work till we see her back. "  In the interim, pt has only been home for ~1 day. She filled her medications and has not missed a single dose of baby ASA or ticagelor or BP meds.  Felt fine when she went to bed. Woke up ~3 AM with crushing substernal CP. 10/10. [employer], research (physical sciences). Radiating to jaw and L arm. Walked around and would not go away. Associated n/v, diaphoresis, and SOB. Took SLN X 1 with no relief. Called EMS and told to take ASA X 4. Pain subsided over 45 minutes. EKG notable for lateral T wave inversion but otherwise unchanged. POC trop 0.92. At the time of interview, pt is CP free and VSS.  Home Medications  Prior to Admission medications   Medication Sig Start Date End Date Taking?  Authorizing Provider  acetaminophen (TYLENOL) 500 MG tablet Take 1,000 mg by mouth daily as needed for moderate pain.    Yes Historical Provider, MD  aspirin EC 81 MG EC tablet Take 1 tablet (81 mg total) by mouth daily. 08/24/15  Yes Erlene Quan, PA-C  atorvastatin (LIPITOR) 80 MG tablet Take 1 tablet (80 mg total) by mouth daily at 6 PM. 08/24/15  Yes Erlene Quan, PA-C  glimepiride (AMARYL) 2 MG tablet Take 1 tablet (2 mg total) by mouth daily with breakfast. 08/24/15  Yes Erlene Quan, PA-C  lisinopril (PRINIVIL,ZESTRIL) 20 MG tablet Take 1 tablet (20 mg total) by mouth daily. 08/24/15  Yes Luke K Kilroy, PA-C  metoprolol tartrate (LOPRESSOR) 100 MG tablet Take 1 tablet (100 mg total) by mouth 2 (two) times daily. 08/24/15  Yes Luke K Kilroy, PA-C  nitroGLYCERIN (NITROSTAT) 0.4 MG SL tablet Place 1 tablet (0.4 mg total) under the tongue every 5 (five) minutes as needed for chest pain. 08/24/15  Yes Luke K Kilroy, PA-C  ranitidine (ZANTAC) 150 MG capsule Take 150 mg by mouth daily as needed for heartburn.   Yes Historical Provider, MD  ticagrelor (BRILINTA) 90 MG TABS tablet Take 1 tablet (90 mg total) by mouth 2 (two) times daily. 08/24/15  Yes Erlene Quan, PA-C   Family History  No family history on file.  Social History  Social History   Social History  . Marital Status: Divorced    Spouse Name: N/A  . Number of Children: N/A  . Years of Education: N/A   Occupational History  . Not on file.   Social History Main Topics  . Smoking status: Former Smoker -- 25 years    Quit date: 07/03/2004  . Smokeless tobacco: Never Used  . Alcohol Use: Yes     Comment: ocassionally   . Drug Use: No  . Sexual Activity: Not on file   Other Topics Concern  . Not on file   Social History Narrative    Review of Systems General:  No chills, fever, night sweats or weight changes.  Cardiovascular:  Per HPI. Dermatological: No rash, lesions/masses. Respiratory: No cough, dyspnea. Urologic: No  hematuria, dysuria. Abdominal:   No nausea, vomiting, diarrhea, bright red blood per rectum, melena, or hematemesis. Neurologic:  No visual changes, wkns, changes in mental status. All other systems reviewed and are otherwise negative except as noted above.  Physical Exam  Blood pressure 116/64, pulse 70, temperature 98.3 F (36.8 C), temperature source Oral, resp. rate 17, height 5\' 2"  (1.575 m), weight 194 lb 3.6 oz (88.1 kg), SpO2 96 %.  General: Pleasant, NAD. Psych: Normal affect. Neuro: Alert and oriented X 3. Moves all extremities spontaneously. HEENT: Normal.  Neck: Supple without bruits or JVD. Lungs:  Resp regular and unlabored, CTA. Heart: RRR no s3, s4, or murmurs. Abdomen: Soft, non-tender, non-distended, BS + x 4.  Extremities:  No clubbing, cyanosis or edema. DP/PT/Radials 2+ and equal bilaterally.  Labs  Troponin [facility] Test)  Recent Labs  08/26/15 0414  TROPIPOC 0.92*    Recent Labs  08/23/15 1011  TROPONINI 0.81*   Lab Results  Component Value Date   WBC 8.7 08/26/2015   HGB 13.6 08/26/2015   HCT 39.2 08/26/2015   MCV 88.9 08/26/2015   PLT 257 08/26/2015    Recent Labs Lab 08/26/15 0346  NA 132*  K 4.4  CL 101  CO2 20*  BUN 22*  CREATININE 0.88  CALCIUM 9.1  GLUCOSE 316*   Lab Results  Component Value Date   CHOL 212* 08/19/2015   HDL 48 08/19/2015   LDLCALC 144* 08/19/2015   TRIG 100 08/19/2015   Lab Results  Component Value Date   DDIMER <0.27 08/18/2015     Radiology/Studies  Dg Chest 2 View  08/26/2015   CLINICAL DATA:  Patient woke up this morning with central chest pressure radiating to the right arm and right neck. Mild shortness of breath and nausea.  EXAM: CHEST  2 VIEW  COMPARISON:  08/18/2015  FINDINGS: Normal heart size and pulmonary vascularity. No focal airspace disease or consolidation in the lungs. No blunting of costophrenic angles. No pneumothorax. Mediastinal contours appear intact. Calcification of the  aorta. Coronary artery stents. Degenerative changes in the spine.  IMPRESSION: No active cardiopulmonary disease.   Electronically Signed   By: Lucienne Capers M.D.   On: 08/26/2015 04:36   Dg Chest 2 View  08/18/2015   CLINICAL DATA:  Mid chest pain and nausea since this morning.  EXAM: CHEST  2 VIEW  COMPARISON:  06/03/2013  FINDINGS: Cardiomediastinal silhouette is normal. Mediastinal contours appear intact.  There is no evidence of focal airspace consolidation, pleural effusion or pneumothorax.  Osseous structures are without acute abnormality. 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 Soft tissues are grossly normal.  IMPRESSION: No active cardiopulmonary disease.   Electronically Signed   By: Fidela Salisbury M.D.   On: 08/18/2015 13:26   US Abdomen Complete  08/18/2015   CLINICAL DATA:  59 year old female with epigastric pain and nausea for 4 days. Initial encounter. Diabetes.  EXAM: ULTRASOUND ABDOMEN COMPLETE  COMPARISON:  Body CT report 10/06/2003 (no images available).  FINDINGS: Gallbladder: No gallstones or wall thickening visualized. No sonographic Murphy sign noted.  Common bile duct: Diameter: 4 mm, normal  Liver: Mildly to moderately echogenic liver (image 36). No intrahepatic biliary ductal dilatation. No discrete liver lesion.  IVC: No abnormality visualized.  Pancreas: Incompletely visualized due to overlying bowel gas, visualized portions within normal limits.  Spleen: Size and appearance within normal limits.  Right Kidney: Length: 12.7 cm. Echogenicity within normal limits. No mass or hydronephrosis visualized.  Left Kidney: Length: 11.5 cm. Echogenicity within normal limits. No mass or hydronephrosis visualized.  Abdominal aorta: No aneurysm visualized.  Other findings: None.  IMPRESSION: Hepatic steatosis, otherwise negative abdomen ultrasound.   Electronically Signed   By: Genevie Ann M.D.   On: 08/18/2015 14:49   ECG  NSR, T wave inversion  I/aVL, otherwise unchanged  ASSESSMENT AND PLAN Pt is a 59 yo F w/ a PMH significant for 3vCAD, DMII, and arthritis p/w CP.  #NSTEMI: Known 3vCAD. Recent LHC X 2 showing widely patent old stent in LCx and new stents LAD and RCA. Dz in small OMs and PDA not intervenable per report. Felt ongoing CP during last admission was due to HTN,  but HR and BP well-controlled at this time. Unclear if elevated trop is tail end from last NSTEMI or new. Doubt repeat LHC would be of value and would favor medical management including aggressive anti-anginal. It is also possible this CP is non-cardiac though hx is good. -admit to tele -trend CE/EKG, check CPK/CK-MB to see if new NSTEMI or downtrending trop from last admission -TFTs, HgbA1c, lipid panel -consider repeat echo if new NSTEMI -continue ASA and ticagrelor, start heparin gtt until CE trajectory clear -continue high-potency statin -continue metop and lisinopril -SLN PRN, consider low-dose imdur vs. ranolazine pending HR/BP  #DMII: -SSI  #OA: -Tylenol PRN  #FEN/GI -PIVB PRN, replete lytes PRN -heart healthy diet  #PPx -heparin gtt per ACS -GI PPx not indicated  #Dispo -pending w/u and eval  Signed, Bari Edward, MD 08/26/2015, 6:06 AM

## 2015-08-26 NOTE — Progress Notes (Signed)
During change of shift; Pt c/o chest discomfort of 5 on 0-10 scale; pt requested for tylenol adm; 0.4mg  NTG adm and pt voices relief; refused second tablet. Reported off to oncoming RN. Delia Heady RN    08/26/15 1912  Vitals  Temp 97.6 F (36.4 C)  Temp Source Oral  BP 122/65 mmHg  MAP (mmHg) 78  BP Location Right Arm  BP Method Automatic  Patient Position (if appropriate) Sitting  Pulse Rate 89  Pulse Rate Source Dinamap  Resp 18  Oxygen Therapy  SpO2 100 %  O2 Device Nasal Cannula  O2 Flow Rate (L/min) 2 L/min  Pain Assessment  Pain Assessment No/denies pain  Pain Score 0  Pain Type Acute pain  Pain Location Chest  Pain Descriptors / Indicators Discomfort  Pain Frequency Intermittent  Pain Onset Gradual  Patients Stated Pain Goal 0  Pain Intervention(s) Medication (See eMAR);Repositioned;Emotional support

## 2015-08-26 NOTE — ED Notes (Signed)
Patient transported to X-ray 

## 2015-08-26 NOTE — Progress Notes (Signed)
Report received from the ED at 1157 and pt arrived to the unit at 1240 via stretcher with IV intact and infusing; pt alert and verbally responsive; VSS; telemetry applied and verified; skin intact with no pressure ulcers noted; pt oriented to the unit and room; pt sitting up in bed with call light within reach. Will closely monitor pt. Francis Gaines Kahliyah Dick RN.

## 2015-08-26 NOTE — Progress Notes (Signed)
    She is feeling OK. Feels like pill gets stuck in her throat  BP stable  Troponin second set slightly less than first (0.7)  Continue heparin today, DC in AM.  I will add IMDUR 30.  Candee Furbish, MD

## 2015-08-26 NOTE — Progress Notes (Signed)
ANTICOAGULATION CONSULT NOTE - Initial Consult  Pharmacy Consult for heparin Indication: chest pain/ACS  No Known Allergies  Patient Measurements: Height: 5\' 2"  (157.5 cm) Weight: 194 lb 3.6 oz (88.1 kg) IBW/kg (Calculated) : 50.1 Heparin Dosing Weight: 70kg  Vital Signs: Temp: 98.3 F (36.8 C) (09/10 0333) Temp Source: Oral (09/10 0333) BP: 116/64 mmHg (09/10 0500) Pulse Rate: 70 (09/10 0500)  Labs:  Recent Labs  08/23/15 1011 08/26/15 0346  HGB  --  13.6  HCT  --  39.2  PLT  --  257  CREATININE  --  0.88  TROPONINI 0.81*  --     Estimated Creatinine Clearance: 73.6 mL/min (by C-G formula based on Cr of 0.88).   Medical History: Past Medical History  Diagnosis Date  . Diabetes mellitus without complication   . Heart attack   . GERD (gastroesophageal reflux disease)   . Hyperlipidemia LDL goal < 70 06/07/2013  . Coronary atherosclerosis of native coronary artery, with stent to LCX in 2006 06/04/2013  . S/P coronary artery stent placement to RCA with PROMUS DES, 06/07/13, residual disease on OM1, OM2 and rPDA 06/07/2013  . Anginal pain   . Hypertension   . Arthritis      Assessment: 59yo female discharged 2d ago after stay for NSTEMI and two-vessel PCI, now c/o central CP radiating to RUE and neck w/ mild SOB and nausea, some relief w/ NTG x2, troponin elevated, to begin heparin.  Goal of Therapy:  Heparin level 0.3-0.7 units/ml Monitor platelets by anticoagulation protocol: Yes   Plan:  Will give heparin 3000 units IV x1 followed by gtt at 1250 units/hr (was stable at this rate during recent admission) and monitor heparin levels and CBC.  Wynona Neat, PharmD, BCPS  08/26/2015,6:03 AM

## 2015-08-26 NOTE — ED Notes (Signed)
Pt. Woke up this morning with central chest pressure radiating to right arm and right neck with mild SOB and  Nausea , pt. received 4 baby ASA and 2 NTG sl with relief , rates chest pain at 7/10 at arrival , denies  emesis or diaphoresis . History of CAD / coronary stents .

## 2015-08-26 NOTE — Progress Notes (Signed)
ANTICOAGULATION CONSULT NOTE - Follow Up Consult  Pharmacy Consult for Heparin Indication: chest pain/ACS  No Known Allergies  Patient Measurements: Height: 5\' 2"  (157.5 cm) Weight: 196 lb 11.2 oz (89.223 kg) IBW/kg (Calculated) : 50.1 Heparin Dosing Weight: 70kg  Vital Signs: Temp: 98.2 F (36.8 C) (09/10 1240) Temp Source: Oral (09/10 1240) BP: 126/66 mmHg (09/10 1240) Pulse Rate: 66 (09/10 1240)  Labs:  Recent Labs  08/26/15 0346 08/26/15 0608 08/26/15 1300  HGB 13.6  --   --   HCT 39.2  --   --   PLT 257  --   --   HEPARINUNFRC  --   --  0.34  CREATININE 0.88  --   --   CKTOTAL 79  --   --   CKMB 2.5  --   --   TROPONINI 0.86* 0.76*  --     Estimated Creatinine Clearance: 74 mL/min (by C-G formula based on Cr of 0.88).   Medications:  Heparin @ 1250 units/hr  Assessment: 58yof s/p recent admission for NSTEMI and two-vessel PCI started on heparin earlier today for chest pain with elevated troponins. Troponins trending down 0.92>>0.76. Initial heparin level is therapeutic at 0.34. CBC stable. No bleeding reported. Noted plan to discontinue heparin in the morning.  Goal of Therapy:  Heparin level 0.3-0.7 units/ml Monitor platelets by anticoagulation protocol: Yes   Plan:  1) Continue heparin at 1250 units/hr 2) Check 6 hour heparin level to confirm  Deboraha Sprang 08/26/2015,2:13 PM

## 2015-08-27 DIAGNOSIS — I10 Essential (primary) hypertension: Secondary | ICD-10-CM

## 2015-08-27 DIAGNOSIS — I2510 Atherosclerotic heart disease of native coronary artery without angina pectoris: Secondary | ICD-10-CM

## 2015-08-27 DIAGNOSIS — E1165 Type 2 diabetes mellitus with hyperglycemia: Secondary | ICD-10-CM

## 2015-08-27 DIAGNOSIS — R7989 Other specified abnormal findings of blood chemistry: Secondary | ICD-10-CM

## 2015-08-27 DIAGNOSIS — R809 Proteinuria, unspecified: Secondary | ICD-10-CM

## 2015-08-27 LAB — CBC
HCT: 39.3 % (ref 36.0–46.0)
Hemoglobin: 13.4 g/dL (ref 12.0–15.0)
MCH: 30.2 pg (ref 26.0–34.0)
MCHC: 34.1 g/dL (ref 30.0–36.0)
MCV: 88.7 fL (ref 78.0–100.0)
Platelets: 285 10*3/uL (ref 150–400)
RBC: 4.43 MIL/uL (ref 3.87–5.11)
RDW: 12.8 % (ref 11.5–15.5)
WBC: 6.3 10*3/uL (ref 4.0–10.5)

## 2015-08-27 LAB — LIPID PANEL
CHOLESTEROL: 138 mg/dL (ref 0–200)
HDL: 41 mg/dL (ref >40)
LDL CALC: 75 mg/dL (ref 0–99)
TRIGLYCERIDES: 108 mg/dL (ref <150)
Total CHOL/HDL Ratio: 3.4 RATIO
VLDL: 22 mg/dL (ref 0–40)

## 2015-08-27 LAB — BRAIN NATRIURETIC PEPTIDE: B NATRIURETIC PEPTIDE 5: 80.6 pg/mL (ref 0.0–100.0)

## 2015-08-27 LAB — BASIC METABOLIC PANEL
Anion gap: 8 (ref 5–15)
BUN: 16 mg/dL (ref 6–20)
CALCIUM: 9.3 mg/dL (ref 8.9–10.3)
CO2: 24 mmol/L (ref 22–32)
CREATININE: 0.81 mg/dL (ref 0.44–1.00)
Chloride: 107 mmol/L (ref 101–111)
GFR calc Af Amer: >60 mL/min (ref >60)
Glucose, Bld: 255 mg/dL — ABNORMAL HIGH (ref 65–99)
Potassium: 4.1 mmol/L (ref 3.5–5.1)
SODIUM: 139 mmol/L (ref 135–145)

## 2015-08-27 LAB — PROTIME-INR
INR: 1.13 (ref 0.00–1.49)
Prothrombin Time: 14.7 seconds (ref 11.6–15.2)

## 2015-08-27 LAB — GLUCOSE, CAPILLARY
GLUCOSE-CAPILLARY: 248 mg/dL — AB (ref 65–99)
GLUCOSE-CAPILLARY: 266 mg/dL — AB (ref 65–99)

## 2015-08-27 LAB — TROPONIN I: Troponin I: 0.79 ng/mL (ref <0.031)

## 2015-08-27 LAB — T4, FREE: FREE T4: 1.07 ng/dL (ref 0.61–1.12)

## 2015-08-27 LAB — HEPARIN LEVEL (UNFRACTIONATED): HEPARIN UNFRACTIONATED: 0.49 [IU]/mL (ref 0.30–0.70)

## 2015-08-27 LAB — TSH: TSH: 0.997 u[IU]/mL (ref 0.350–4.500)

## 2015-08-27 MED ORDER — ISOSORBIDE MONONITRATE ER 30 MG PO TB24: 30.0000 mg | ORAL_TABLET | Freq: Every day | ORAL | Status: DC

## 2015-08-27 NOTE — Progress Notes (Signed)
ANTICOAGULATION CONSULT NOTE - Follow Up Consult  Pharmacy Consult for Heparin Indication: chest pain/ACS  No Known Allergies  Patient Measurements: Height: 5\' 2"  (157.5 cm) Weight: 196 lb 11.2 oz (89.223 kg) IBW/kg (Calculated) : 50.1 Heparin Dosing Weight: 70kg  Vital Signs: Temp: 97.9 F (36.6 C) (09/11 0641) Temp Source: Oral (09/11 0641) BP: 131/69 mmHg (09/11 0641) Pulse Rate: 76 (09/11 0641)  Labs:  Recent Labs  08/26/15 0346 08/26/15 0608 08/26/15 1300 08/26/15 2357 08/27/15 0632  HGB 13.6  --   --   --  13.4  HCT 39.2  --   --   --  39.3  PLT 257  --   --   --  285  LABPROT  --   --   --   --  14.7  INR  --   --   --   --  1.13  HEPARINUNFRC  --   --  0.34  --  0.49  CREATININE 0.88  --   --   --  0.81  CKTOTAL 79  --   --   --   --   CKMB 2.5  --   --   --   --   TROPONINI 0.86* 0.76* 0.82* 0.79*  --     Estimated Creatinine Clearance: 80.4 mL/min (by C-G formula based on Cr of 0.81).   Medications:  Heparin @ 1250 units/hr  Assessment: 58yof s/p recent admission for NSTEMI and two-vessel PCI started on heparin earlier today for chest pain with elevated troponins. Heparin level is therapeutic. CBC stable. No bleeding reported. Noted plan to discontinue heparin this morning.  Goal of Therapy:  Heparin level 0.3-0.7 units/ml Monitor platelets by anticoagulation protocol: Yes   Plan:  1) Continue heparin at 1250 units/hr 2) Follow up d/c  Deboraha Sprang 08/27/2015,10:28 AM

## 2015-08-27 NOTE — Discharge Summary (Signed)
Physician Discharge Summary  Patient ID: [name] MRN: [id number] DOB/AGE: [birth year] 59 y.o.   Primary Cardiologist: Dr. Sallyanne Kuster  Admit date: 08/26/2015 Discharge date: 08/27/2015  Admission Diagnoses: Chest Pain  Discharge Diagnoses:  Active Problems:   NSTEMI (non-ST elevated myocardial infarction)   Discharged Condition: stable  Hospital Course: 59 year old female recently admitted for CP/NSTEMI and discharged on 08/24/15 who presented [facility] to [facility] ED 08/26/15 with CP.   In addition to CAD, her other PMH is significant for  hypertension, hyperlipidemia, diabetes and obesity. Her CAD history began in 2006 where she had a stent placed in her circumflex. She then had acute coronary syndrome in June 2014 underwent PCI to the RCA. At that time she noted a patent stent in the circumflex with severe disease in OM1 and OM 2 as well as the PDA. Overall too small for PCI. She did well for about a year and then apparently because of financial issues abruptly stop all of her medications about a year ago. She Presented 08/18/15 with unstable angina/non-ST elevation MI with Troponin peak of 1.35. She underwent Two-vessel PCI of the LAD and RCA (new sites) with DES stents on 08/22/15 post cath she had severe 10/10 chest pain. She was noted to be profoundly hypertensive. Pain did improve with improvement of her blood pressure but she still persisted and 5 out of 10 pain. She went for re-look catheterization which revealed widely patent LAD and RCA stents. She was discharged 08/24/15.  She presented back 08/26/15 with chest pain, but more atypical. There was suspicion for perhaps a GI etiolgoy as she described discomfort with swallowing pills. Her EKG was unremarkable. Cardiac enzymes were cycled and showed continued downward trend from prior episode. She ultimately had spontaneous resolution of her CP. It was felt that no additional cardiac w/u was indicated. She did however have moderate HTN, thus Imdur  was added. She was last seen and examined by Dr. Marlou Porch who determined she was stable for discharge home. She as a clinic appointment scheduled 09/05/15 with Kerin Ransom, PA-C.  Consults: None   Treatments: See Hospital Course  Discharge Exam: Blood pressure 160/85, pulse 92, temperature 98.4 F (36.9 C), temperature source Oral, resp. rate 18, height 5\' 2"  (1.575 m), weight 196 lb 11.2 oz (89.223 kg), SpO2 100 %.   Disposition: 01-Home or Self Care      Discharge Instructions    Diet - low sodium heart healthy    Complete by:  As directed      Increase activity slowly    Complete by:  As directed             Medication List    TAKE these medications        acetaminophen 500 MG tablet  Commonly known as:  TYLENOL  Take 1,000 mg by mouth daily as needed for moderate pain.     aspirin 81 MG EC tablet  Take 1 tablet (81 mg total) by mouth daily.     atorvastatin 80 MG tablet  Commonly known as:  LIPITOR  Take 1 tablet (80 mg total) by mouth daily at 6 PM.     glimepiride 2 MG tablet  Commonly known as:  AMARYL  Take 1 tablet (2 mg total) by mouth daily with breakfast.     isosorbide mononitrate 30 MG 24 hr tablet  Commonly known as:  IMDUR  Take 1 tablet (30 mg total) by mouth daily.     lisinopril 20 MG tablet  Commonly known as:  PRINIVIL,ZESTRIL  Take 1 tablet (20 mg total) by mouth daily.     metoprolol 100 MG tablet  Commonly known as:  LOPRESSOR  Take 1 tablet (100 mg total) by mouth 2 (two) times daily.     nitroGLYCERIN 0.4 MG SL tablet  Commonly known as:  NITROSTAT  Place 1 tablet (0.4 mg total) under the tongue every 5 (five) minutes as needed for chest pain.     ranitidine 150 MG capsule  Commonly known as:  ZANTAC  Take 150 mg by mouth daily as needed for heartburn.     ticagrelor 90 MG Tabs tablet  Commonly known as:  BRILINTA  Take 1 tablet (90 mg total) by mouth 2 (two) times daily.       Follow-up Information    Follow up with  Erlene Quan, PA-C On 09/05/2015.   Specialties:  Cardiology, Radiology   Why:  8:30 (cardiology follow-up)   Contact information:   [address] [place] [address] [place], INCLUDING PHYSICIAN TIME: >30 MINUTES  Signed: Lyda Jester 08/27/2015, 11:48 AM

## 2015-08-27 NOTE — Progress Notes (Signed)
Pt discharge education and instructions completed with pt and son in-law at bedside; both voices understanding and denies any questions. Pt IV and telemetry removed; pt to pick up electronically sent prescription from preferred pharmacy on file. Pt discharge home with son in-law to transport her home. Pt transported off unit via wheelchair with belongings and son in-law at side. Francis Gaines Buren Havey RN.

## 2015-08-27 NOTE — Progress Notes (Signed)
Subjective:  No further CP. Feels much better she say. Happy she can go home. Has appt in 2 days.   Objective:  Vital Signs in the last 24 hours: Temp:  [97.6 F (36.4 C)-98.4 F (36.9 C)] 98.4 F (36.9 C) (09/11 1029) Pulse Rate:  [66-92] 92 (09/11 1029) Resp:  [18] 18 (09/11 1029) BP: (122-160)/(65-85) 160/85 mmHg (09/11 1029) SpO2:  [98 %-100 %] 100 % (09/11 1029) Weight:  [196 lb 11.2 oz (89.223 kg)] 196 lb 11.2 oz (89.223 kg) (09/10 1240)  Intake/Output from previous day: 09/10 0701 - 09/11 0700 In: 750 [P.O.:600; I.V.:150] Out: 650 [Urine:650]   Physical Exam: General: Well developed, well nourished, in no acute distress. Head:  Normocephalic and atraumatic. Lungs: Clear to auscultation and percussion. Heart: Normal S1 and S2.  No murmur, rubs or gallops.  Abdomen: soft, non-tender, positive bowel sounds. Extremities: No clubbing or cyanosis. No edema. Neurologic: Alert and oriented x 3.    Lab Results:  Recent Labs  08/26/15 0346 08/27/15 0632  WBC 8.7 6.3  HGB 13.6 13.4  PLT 257 285    Recent Labs  08/26/15 0346 08/27/15 0632  NA 132* 139  K 4.4 4.1  CL 101 107  CO2 20* 24  GLUCOSE 316* 255*  BUN 22* 16  CREATININE 0.88 0.81    Recent Labs  08/26/15 1300 08/26/15 2357  TROPONINI 0.82* 0.79*   Hepatic Function Panel No results for input(s): PROT, ALBUMIN, AST, ALT, ALKPHOS, BILITOT, BILIDIR, IBILI in the last 72 hours.  Recent Labs  08/27/15 0632  CHOL 138   No results for input(s): PROTIME in the last 72 hours.  Imaging: Dg Chest 2 View  08/26/2015   CLINICAL DATA:  Patient woke up this morning with central chest pressure radiating to the right arm and right neck. Mild shortness of breath and nausea.  EXAM: CHEST  2 VIEW  COMPARISON:  08/18/2015  FINDINGS: Normal heart size and pulmonary vascularity. No focal airspace disease or consolidation in the lungs. No blunting of costophrenic angles. No pneumothorax. Mediastinal contours  appear intact. Calcification of the aorta. Coronary artery stents. Degenerative changes in the spine.  IMPRESSION: No active cardiopulmonary disease.   Electronically Signed   By: Lucienne Capers M.D.   On: 08/26/2015 04:36   Personally viewed.   Telemetry: No adverse rhythms Personally viewed.   EKG:  NSR, subtle non specific T wave changes  Cath (Dr. Ellyn Hack) 08/22/15  Patent stents in RCA - from 2014 & this AM. Patent LAD stent.  1st Mrg lesion, 90% stenosed. Ost 2nd Mrg to 2nd Mrg lesion, 55% stenosed. 3rd Mrg lesion, 80% stenosed.  Mid Cx to Dist Cx lesion, 5% stenosed. The lesion was previously treated with a stent (unknown type) greater than two years ago. 2006 - in [place] 1st Diag to 1st Diag lesion, 40% stenosed. Dist LAD lesion, 90% stenosed.  Normal Systolic Pressure with normal LVEDP.  Scheduled Meds: . aspirin EC  81 mg Oral Daily  . atorvastatin  80 mg Oral q1800  . insulin aspart  0-15 Units Subcutaneous TID WC  . insulin aspart  0-5 Units Subcutaneous QHS  . isosorbide mononitrate  30 mg Oral Daily  . lisinopril  20 mg Oral Daily  . metoprolol tartrate  100 mg Oral BID  . ticagrelor  90 mg Oral BID   Continuous Infusions: . heparin 1,250 Units/hr (08/27/15 0006)   PRN Meds:.acetaminophen, acetaminophen, nitroGLYCERIN, ondansetron (ZOFRAN) IV   Assessment/Plan:  59 year old with CAD, recent PCI with recent NSTEMI, troponin trending down, chest pain.  Chest pain  - no further. Has some atypical features to it. ?GI. Pill getting stuck feeling at times. May need endo.   - troponin is trending down from prior episode. ECG unremarkable.   - OK to DC home  - has close follow up in 2 days she states.   - Cath just performed on 08/22/15. Patent stents. See above   - new start IMDUR. Continue other home meds.   Elevated troponin  - trending down from prior episode.   Essential hypertension  - improved. Imdur added.   - mildly labile  DM  - continue  to work on this as outpatient.      [name], [place] 08/27/2015, 11:27 AM

## 2015-08-28 LAB — GLUCOSE, CAPILLARY: GLUCOSE-CAPILLARY: 227 mg/dL — AB (ref 65–99)

## 2015-08-28 LAB — HEMOGLOBIN A1C
Hgb A1c MFr Bld: 11.2 % — ABNORMAL HIGH (ref 4.8–5.6)
Mean Plasma Glucose: 275 mg/dL

## 2015-08-29 ENCOUNTER — Ambulatory Visit: Payer: Self-pay | Attending: Family Medicine | Admitting: Family Medicine

## 2015-08-29 VITALS — BP 120/79 | HR 71 | Temp 98.3°F | Ht 62.0 in | Wt 194.0 lb

## 2015-08-29 DIAGNOSIS — E1165 Type 2 diabetes mellitus with hyperglycemia: Secondary | ICD-10-CM

## 2015-08-29 DIAGNOSIS — IMO0002 Reserved for concepts with insufficient information to code with codable children: Secondary | ICD-10-CM

## 2015-08-29 DIAGNOSIS — Z87891 Personal history of nicotine dependence: Secondary | ICD-10-CM | POA: Insufficient documentation

## 2015-08-29 DIAGNOSIS — I25110 Atherosclerotic heart disease of native coronary artery with unstable angina pectoris: Secondary | ICD-10-CM

## 2015-08-29 DIAGNOSIS — E785 Hyperlipidemia, unspecified: Secondary | ICD-10-CM

## 2015-08-29 DIAGNOSIS — Z9119 Patient's noncompliance with other medical treatment and regimen: Secondary | ICD-10-CM

## 2015-08-29 DIAGNOSIS — Z91199 Patient's noncompliance with other medical treatment and regimen due to unspecified reason: Secondary | ICD-10-CM

## 2015-08-29 DIAGNOSIS — I10 Essential (primary) hypertension: Secondary | ICD-10-CM

## 2015-08-29 LAB — GLUCOSE, POCT (MANUAL RESULT ENTRY): POC GLUCOSE: 254 mg/dL — AB (ref 70–99)

## 2015-08-29 MED ORDER — TICAGRELOR 90 MG PO TABS: 90.0000 mg | ORAL_TABLET | Freq: Two times a day (BID) | ORAL | Status: DC

## 2015-08-29 MED ORDER — METFORMIN HCL 500 MG PO TABS: 500.0000 mg | ORAL_TABLET | Freq: Two times a day (BID) | ORAL | Status: DC

## 2015-08-29 NOTE — Patient Instructions (Signed)
Diabetes Mellitus and Food It is important for you to manage your blood sugar (glucose) level. Your blood glucose level can be greatly affected by what you eat. Eating healthier foods in the appropriate amounts throughout the day at about the same time each day will help you control your blood glucose level. It can also help slow or prevent worsening of your diabetes mellitus. Healthy eating may even help you improve the level of your blood pressure and reach or maintain a healthy weight.  HOW CAN FOOD AFFECT ME? Carbohydrates Carbohydrates affect your blood glucose level more than any other type of food. Your dietitian will help you determine how many carbohydrates to eat at each meal and teach you how to count carbohydrates. Counting carbohydrates is important to keep your blood glucose at a healthy level, especially if you are using insulin or taking certain medicines for diabetes mellitus. Alcohol Alcohol can cause sudden decreases in blood glucose (hypoglycemia), especially if you use insulin or take certain medicines for diabetes mellitus. Hypoglycemia can be a life-threatening condition. Symptoms of hypoglycemia (sleepiness, dizziness, and disorientation) are similar to symptoms of having too much alcohol.  If your health care provider has given you approval to drink alcohol, do so in moderation and use the following guidelines:  Women should not have more than one drink per day, and men should not have more than two drinks per day. One drink is equal to:  12 oz of beer.  5 oz of wine.  1 oz of hard liquor.  Do not drink on an empty stomach.  Keep yourself hydrated. Have water, diet soda, or unsweetened iced tea.  Regular soda, juice, and other mixers might contain a lot of carbohydrates and should be counted. WHAT FOODS ARE NOT RECOMMENDED? As you make food choices, it is important to remember that all foods are not the same. Some foods have fewer nutrients per serving than other  foods, even though they might have the same number of calories or carbohydrates. It is difficult to get your body what it needs when you eat foods with fewer nutrients. Examples of foods that you should avoid that are high in calories and carbohydrates but low in nutrients include:  Trans fats (most processed foods list trans fats on the Nutrition Facts label).  Regular soda.  Juice.  Candy.  Sweets, such as cake, pie, doughnuts, and cookies.  Fried foods. WHAT FOODS CAN I EAT? Have nutrient-rich foods, which will nourish your body and keep you healthy. The food you should eat also will depend on several factors, including:  The calories you need.  The medicines you take.  Your weight.  Your blood glucose level.  Your blood pressure level.  Your cholesterol level. You also should eat a variety of foods, including:  Protein, such as meat, poultry, fish, tofu, nuts, and seeds (lean animal proteins are best).  Fruits.  Vegetables.  Dairy products, such as milk, cheese, and yogurt (low fat is best).  Breads, grains, pasta, cereal, rice, and beans.  Fats such as olive oil, trans fat-free margarine, canola oil, avocado, and olives. DOES EVERYONE WITH DIABETES MELLITUS HAVE THE SAME MEAL PLAN? Because every person with diabetes mellitus is different, there is not one meal plan that works for everyone. It is very important that you meet with a dietitian who will help you create a meal plan that is just right for you. Document Released: 08/29/2005 Document Revised: 12/07/2013 Document Reviewed: 10/29/2013 Exit[employer] Patient Information 2015 [employer]. This   information is not intended to replace advice given to you by your health care provider. Make sure you discuss any questions you have with your health care provider.  

## 2015-08-29 NOTE — Progress Notes (Signed)
Patient here to establish care after recent cardiac cath and NSTEMI She reports no pain today and has been taking all medications as directed Her CBG is 350 today and patient last ate around 1100 and she took her medications this am She states she checks her sugars at home and they have been running high She prefers to stay on oral diabetes medications and manage with diet and exercise

## 2015-08-30 ENCOUNTER — Ambulatory Visit: Payer: Self-pay | Attending: Internal Medicine

## 2015-08-30 NOTE — Progress Notes (Signed)
Subjective:  Patient ID: [name], female    DOB: Apr 28, 1956  Age: 59 y.o. MRN: [id number]  CC: Establish Care and NSTEMI   HPI [name] presents for hospital follow-up. Medical history is notable for noncompliance, uncontrolled type 2 diabetes mellitus, dyslipidemia, hypertension, coronary artery disease status post stent.  She presented to the ED on 08/26/15 with chest pain; troponins were 0.86, 0.76, 0.82, 0.79 and EKG was unremarkable. Chest pain was thought to be atypical and suspicious for GI etiology. Prior to this presentation she presented 08/18/15 with unstable angina/non-ST elevation MI with Troponin peak of 1.35. She underwent Two-vessel PCI of the LAD and RCA (new sites) with DES stents on 08/22/15 post cath she had severe 10/10 chest pain. She was noted to be profoundly hypertensive. Pain did improve with improvement of her blood pressure but she still had 5 out of 10 pain. She went for re-look catheterization which revealed widely patent LAD and RCA stents. She was discharged 08/24/15.  On her most recent hospitalization Imdur was added for better control of blood pressure and she was discharged on 08/27/15 with a follow-up with cardiology on 09/05/15  Interval history: She reports feeling well and has been compliant with her medications. Her diabetes is uncontrolled and she reports high sugars at home and she emphasizes that she would not want to be on insulin.   Outpatient Prescriptions Prior to Visit  Medication Sig Dispense Refill  . acetaminophen (TYLENOL) 500 MG tablet Take 1,000 mg by mouth daily as needed for moderate pain.     Marland Kitchen aspirin EC 81 MG EC tablet Take 1 tablet (81 mg total) by mouth daily.    Marland Kitchen atorvastatin (LIPITOR) 80 MG tablet Take 1 tablet (80 mg total) by mouth daily at 6 PM. 30 tablet 11  . glimepiride (AMARYL) 2 MG tablet Take 1 tablet (2 mg total) by mouth daily with breakfast. 30 tablet 11  . isosorbide mononitrate (IMDUR) 30 MG 24 hr tablet Take  1 tablet (30 mg total) by mouth daily. 30 tablet 5  . lisinopril (PRINIVIL,ZESTRIL) 20 MG tablet Take 1 tablet (20 mg total) by mouth daily. 30 tablet 11  . metoprolol tartrate (LOPRESSOR) 100 MG tablet Take 1 tablet (100 mg total) by mouth 2 (two) times daily. 60 tablet 11  . nitroGLYCERIN (NITROSTAT) 0.4 MG SL tablet Place 1 tablet (0.4 mg total) under the tongue every 5 (five) minutes as needed for chest pain. 25 tablet 2  . ranitidine (ZANTAC) 150 MG capsule Take 150 mg by mouth daily as needed for heartburn.    . ticagrelor (BRILINTA) 90 MG TABS tablet Take 1 tablet (90 mg total) by mouth 2 (two) times daily. 60 tablet 11   No facility-administered medications prior to visit.    ROS Review of Systems  Constitutional: Negative for activity change, appetite change and fatigue.  HENT: Negative for congestion, sinus pressure and sore throat.   Eyes: Negative for visual disturbance.  Respiratory: Negative for cough, chest tightness, shortness of breath and wheezing.   Cardiovascular: Negative for chest pain and palpitations.  Gastrointestinal: Negative for abdominal pain, constipation and abdominal distention.  Endocrine: Negative for polydipsia.  Genitourinary: Negative for dysuria and frequency.  Musculoskeletal: Negative for back pain and arthralgias.  Skin: Negative for rash.  Neurological: Negative for tremors, light-headedness and numbness.  Hematological: Does not bruise/bleed easily.  Psychiatric/Behavioral: Negative for behavioral problems and agitation.    Objective:  BP 120/79 mmHg  Pulse 71  Temp(Src)  98.3 F (36.8 C)  Ht 5\' 2"  (1.575 m)  Wt 194 lb (87.998 kg)  BMI 35.47 kg/m2  SpO2 99%  BP/Weight 08/29/2015 08/27/2015 8/93/8101  Systolic BP 751 025 -  Diastolic BP 79 85 -  Wt. (Lbs) 194 - 196.7  BMI 35.47 - 35.97    Lab Results  Component Value Date   WBC 6.3 08/27/2015   HGB 13.4 08/27/2015   HCT 39.3 08/27/2015   PLT 285 08/27/2015   GLUCOSE 255*  08/27/2015   CHOL 138 08/27/2015   TRIG 108 08/27/2015   HDL 41 08/27/2015   LDLCALC 75 08/27/2015   ALT 21 08/18/2015   AST 25 08/18/2015   NA 139 08/27/2015   K 4.1 08/27/2015   CL 107 08/27/2015   CREATININE 0.81 08/27/2015   BUN 16 08/27/2015   CO2 24 08/27/2015   TSH 0.997 08/27/2015   INR 1.13 08/27/2015   HGBA1C 11.2* 08/27/2015   MICROALBUR 1.55 07/26/2010    Physical Exam  Constitutional: She is oriented to person, place, and time. She appears well-developed and well-nourished. No distress.  HENT:  Head: Normocephalic.  Right Ear: External ear normal.  Left Ear: External ear normal.  Nose: Nose normal.  Mouth/Throat: Oropharynx is clear and moist.  Eyes: Conjunctivae and EOM are normal. Pupils are equal, round, and reactive to light.  Neck: Normal range of motion. No JVD present.  Cardiovascular: Normal rate, regular rhythm, normal heart sounds and intact distal pulses.  Exam reveals no gallop.   No murmur heard. Pulmonary/Chest: Effort normal and breath sounds normal. No respiratory distress. She has no wheezes. She has no rales. She exhibits no tenderness.  Abdominal: Soft. Bowel sounds are normal. She exhibits no distension and no mass. There is no tenderness.  Musculoskeletal: Normal range of motion. She exhibits no edema or tenderness.  Neurological: She is alert and oriented to person, place, and time. She has normal reflexes.  Skin: Skin is warm and dry. She is not diaphoretic.  Psychiatric: She has a normal mood and affect.     Assessment & Plan:   1. Type 2 diabetes mellitus with hyperglycemia Uncontrolled with A1c of 11.2, CBG of 350. She is reluctant to going on insulin. I have added metformin to her regimen and if she is still hyperglycemic based on her blood sugar log I will increase the dose of Amaryl at her next visit. ADA diet, lifestyle changes. - Glucose (CBG)  2. Type 2 diabetes mellitus, uncontrolled I will discuss eye exam and foot exam  at her next visit  3. Dyslipidemia Continue statin. Secondary prevention  4. Coronary artery disease involving native coronary artery of native heart with unstable angina pectoris Status post DES stent Continue Brilinta Has an upcoming appointment with cardiology on 09/05/15  5. Essential hypertension Controlled. Continue antihypertensive.  6. Non-compliance with treatment-(cost) Compliance emphasized and implications of not having with treatment plan explained to the patient. She has also been educated about the resources from the pharmacy in-house which will help her afford her medications.   Meds ordered this encounter  Medications  . ticagrelor (BRILINTA) 90 MG TABS tablet    Sig: Take 1 tablet (90 mg total) by mouth 2 (two) times daily.    Dispense:  60 tablet    Refill:  11  . metFORMIN (GLUCOPHAGE) 500 MG tablet    Sig: Take 1 tablet (500 mg total) by mouth 2 (two) times daily with a meal. For 1 week then 2 tablets (1000mg ) twice  daily    Dispense:  120 tablet    Refill:  3    Follow-up: Return in about 2 weeks (around 09/12/2015) for follow up of DM with Dr Jarold Song.   Arnoldo Morale MD

## 2015-08-31 ENCOUNTER — Encounter: Payer: Self-pay | Admitting: Family Medicine

## 2015-09-05 ENCOUNTER — Telehealth: Payer: Self-pay | Admitting: General Practice

## 2015-09-05 ENCOUNTER — Ambulatory Visit (INDEPENDENT_AMBULATORY_CARE_PROVIDER_SITE_OTHER): Payer: Self-pay | Admitting: Cardiology

## 2015-09-05 ENCOUNTER — Encounter: Payer: Self-pay | Admitting: Cardiology

## 2015-09-05 VITALS — BP 110/68 | HR 67 | Ht 62.0 in | Wt 190.5 lb

## 2015-09-05 DIAGNOSIS — I2510 Atherosclerotic heart disease of native coronary artery without angina pectoris: Secondary | ICD-10-CM

## 2015-09-05 DIAGNOSIS — I214 Non-ST elevation (NSTEMI) myocardial infarction: Secondary | ICD-10-CM

## 2015-09-05 DIAGNOSIS — E1165 Type 2 diabetes mellitus with hyperglycemia: Secondary | ICD-10-CM

## 2015-09-05 DIAGNOSIS — I119 Hypertensive heart disease without heart failure: Secondary | ICD-10-CM

## 2015-09-05 DIAGNOSIS — Z9861 Coronary angioplasty status: Secondary | ICD-10-CM

## 2015-09-05 DIAGNOSIS — E785 Hyperlipidemia, unspecified: Secondary | ICD-10-CM

## 2015-09-05 DIAGNOSIS — IMO0002 Reserved for concepts with insufficient information to code with codable children: Secondary | ICD-10-CM

## 2015-09-05 NOTE — Telephone Encounter (Signed)
Patient called stating that one of her medication are making her feel sick. Patient stated that she does not know which medication it is. Please f/u with pt.

## 2015-09-05 NOTE — Assessment & Plan Note (Signed)
On statin Rx 

## 2015-09-05 NOTE — Assessment & Plan Note (Signed)
She is having some nausea which she attributes to Metformin

## 2015-09-05 NOTE — Progress Notes (Signed)
09/05/2015 [name]   05-03-1956  [id number]  Primary Physician No PCP Per Patient Primary Cardiologist: Dr Sallyanne Kuster  HPI:  59 year old woman with history of hypertension, hyperlipidemia, diabetes, obesity and known CAD. CAD history began in 2006 where she had a stent placed in her circumflex. She then had acute coronary syndrome in June 2014 underwent PCI to the RCA. She did well for about a year and then apparently because of financial issues abruptly stop all of her medications. She Presented 08/18/15 with NSTEMI,  Troponin peak of 1.35. She underwent two-vessel PCI of the LAD and RCA (new sites) with DES stents on 08/22/15.  Post cath she had severe 10/10 chest pain. She went for re-look catheterization which revealed widely patent LAD and RCA stents. She was noted to be profoundly hypertensive. Pain did improve with improvement of her blood pressure.  Dr Ellyn Hack felt her chest pain was from uncontrolled HTN and her medications were adjusted.          She was then admitted again 08/26/15-08/27/15 for chest pain. It was felt her symptoms were atypical and she was observed overnight and discharged. She is in th office today for TPC f/u. She is taking all her medications. She is back to work at [employer]. She has not had recurrent chest pain. She has noted some nausea which she attributes to Metformin.    Current Outpatient Prescriptions  Medication Sig Dispense Refill  . acetaminophen (TYLENOL) 500 MG tablet Take 1,000 mg by mouth daily as needed for moderate pain.     Marland Kitchen aspirin EC 81 MG EC tablet Take 1 tablet (81 mg total) by mouth daily.    Marland Kitchen atorvastatin (LIPITOR) 80 MG tablet Take 1 tablet (80 mg total) by mouth daily at 6 PM. 30 tablet 11  . glimepiride (AMARYL) 2 MG tablet Take 1 tablet (2 mg total) by mouth daily with breakfast. 30 tablet 11  . isosorbide mononitrate (IMDUR) 30 MG 24 hr tablet Take 1 tablet (30 mg total) by mouth daily. 30 tablet 5  . lisinopril  (PRINIVIL,ZESTRIL) 20 MG tablet Take 1 tablet (20 mg total) by mouth daily. 30 tablet 11  . metFORMIN (GLUCOPHAGE) 500 MG tablet Take 1 tablet (500 mg total) by mouth 2 (two) times daily with a meal. For 1 week then 2 tablets (1000mg ) twice daily 120 tablet 3  . metoprolol tartrate (LOPRESSOR) 100 MG tablet Take 1 tablet (100 mg total) by mouth 2 (two) times daily. 60 tablet 11  . nitroGLYCERIN (NITROSTAT) 0.4 MG SL tablet Place 1 tablet (0.4 mg total) under the tongue every 5 (five) minutes as needed for chest pain. 25 tablet 2  . ranitidine (ZANTAC) 150 MG capsule Take 150 mg by mouth daily as needed for heartburn.    . ticagrelor (BRILINTA) 90 MG TABS tablet Take 1 tablet (90 mg total) by mouth 2 (two) times daily. 60 tablet 11   No current facility-administered medications for this visit.    No Known Allergies  Social History   Social History  . Marital Status: Divorced    Spouse Name: N/A  . Number of Children: N/A  . Years of Education: N/A   Occupational History  . Not on file.   Social History Main Topics  . Smoking status: Former Smoker -- 25 years    Quit date: 07/03/2004  . Smokeless tobacco: Never Used  . Alcohol Use: Yes     Comment: ocassionally   . Drug Use: No  .  Sexual Activity: Not on file   Other Topics Concern  . Not on file   Social History Narrative     Review of Systems: General: negative for chills, fever, night sweats or weight changes.  Cardiovascular: negative for chest pain, dyspnea on exertion, edema, orthopnea, palpitations, paroxysmal nocturnal dyspnea or shortness of breath Dermatological: negative for rash Respiratory: negative for cough or wheezing Urologic: negative for hematuria Abdominal: negative for nausea, vomiting, diarrhea, bright red blood per rectum, melena, or hematemesis Neurologic: negative for visual changes, syncope, or dizziness All other systems reviewed and are otherwise negative except as noted above.    Blood  pressure 110/68, pulse 67, height 5\' 2"  (1.575 m), weight 190 lb 8 oz (86.41 kg).  General appearance: alert, cooperative and no distress Neck: no carotid bruit and no JVD Lungs: clear to auscultation bilaterally Heart: regular rate and rhythm Extremities: extremities normal, atraumatic, no cyanosis or edema Skin: Skin color, texture, turgor normal. No rashes or lesions Neurologic: Grossly normal  EKG NSR  ASSESSMENT AND PLAN:   NSTEMI (non-ST elevated myocardial infarction) 08/18/15, no recurrent chest pain. She is back to work.  CAD -S/P RCA DES 08/22/15 LAD and RCA DES 08/22/15 (new sites), patent on early re look 08/23/15  Type 2 diabetes mellitus, uncontrolled She is having some nausea which she attributes to Metformin  Hypertensive cardiovascular disease This was a problem in the hospital, now controlled.  Dyslipidemia On statin Rx  CAD- CFX PCI 2006, RCA PCI 2014 .   PLAN  Same cardiac Rx. I suggested she contact her PCP about the Metformin. She should see Dr Delrae Alfred in 3 months. We discussed the importance of Brilinta compliance.   KILROY,LUKE K PA-C 09/05/2015 9:15 AM

## 2015-09-05 NOTE — Assessment & Plan Note (Signed)
08/18/15, no recurrent chest pain. She is back to work.

## 2015-09-05 NOTE — Assessment & Plan Note (Signed)
LAD and RCA DES 08/22/15 (new sites), patent on early re look 08/23/15

## 2015-09-05 NOTE — Patient Instructions (Signed)
Continue same medications    Schedule appointment with Dr.Croitoru in 3 months

## 2015-09-05 NOTE — Assessment & Plan Note (Signed)
This was a problem in the hospital, now controlled.

## 2015-09-06 ENCOUNTER — Telehealth: Payer: Self-pay | Admitting: *Deleted

## 2015-09-06 NOTE — Telephone Encounter (Signed)
Signed order for cardiac rehab faxed to [facility] faxed.

## 2015-09-12 ENCOUNTER — Encounter: Payer: Self-pay | Admitting: Family Medicine

## 2015-09-12 ENCOUNTER — Ambulatory Visit: Payer: Self-pay | Attending: Family Medicine | Admitting: Family Medicine

## 2015-09-12 VITALS — BP 151/96 | HR 54 | Temp 98.3°F | Ht 62.0 in | Wt 194.0 lb

## 2015-09-12 DIAGNOSIS — E1165 Type 2 diabetes mellitus with hyperglycemia: Secondary | ICD-10-CM | POA: Insufficient documentation

## 2015-09-12 DIAGNOSIS — Z9861 Coronary angioplasty status: Secondary | ICD-10-CM | POA: Insufficient documentation

## 2015-09-12 DIAGNOSIS — I10 Essential (primary) hypertension: Secondary | ICD-10-CM | POA: Insufficient documentation

## 2015-09-12 DIAGNOSIS — E785 Hyperlipidemia, unspecified: Secondary | ICD-10-CM | POA: Insufficient documentation

## 2015-09-12 DIAGNOSIS — I2510 Atherosclerotic heart disease of native coronary artery without angina pectoris: Secondary | ICD-10-CM | POA: Insufficient documentation

## 2015-09-12 LAB — GLUCOSE, POCT (MANUAL RESULT ENTRY): POC Glucose: 177 mg/dl — AB (ref 70–99)

## 2015-09-12 MED ORDER — GLIMEPIRIDE 4 MG PO TABS: 4.0000 mg | ORAL_TABLET | Freq: Every day | ORAL | Status: DC

## 2015-09-12 NOTE — Progress Notes (Signed)
Subjective:    Patient ID: [name], female    DOB: 1956/05/11, 59 y.o.   MRN: [id number]  HPI  [name] is a 59 year old female with a history of uncontrolled type 2 diabetes mellitus (A1c 11.2), dyslipidemia, coronary artery disease status post  DES stent (in 08/2015, currently on Brilinta), essential hypertension who comes in today for a follow-up visit. At her last office visit metformin was added to her regimen which she had to stop because she had persistent nausea but she continued taking her Amaryl and reports her fasting blood sugars have been in the 160s to 170s. She has been to see her cardiologist with no change in her management.  Her blood pressure is a bit elevated today and she endorses compliance with her medications. She has no other complaints today.  Past Medical History  Diagnosis Date  . Diabetes mellitus without complication   . Heart attack   . GERD (gastroesophageal reflux disease)   . Hyperlipidemia LDL goal < 70 06/07/2013  . Coronary atherosclerosis of native coronary artery, with stent to LCX in 2006 06/04/2013  . S/P coronary artery stent placement to RCA with PROMUS DES, 06/07/13, residual disease on OM1, OM2 and rPDA 06/07/2013  . Anginal pain   . Hypertension   . Arthritis     Past Surgical History  Procedure Laterality Date  . Coronary stent placement  approx 2005    1 stent  . Left heart catheterization with coronary angiogram N/A 06/07/2013    Procedure: LEFT HEART CATHETERIZATION WITH CORONARY ANGIOGRAM;  Surgeon: Sanda Klein, MD;  Location: [facility];  Service: Cardiovascular;  Laterality: N/A;  . Cardiac catheterization N/A 08/22/2015    Procedure: Left Heart Cath and Coronary Angiography;  Surgeon: Leonie Man, MD;  Location: [facility];  Service: Cardiovascular;  Laterality: N/A;  . Cardiac catheterization N/A 08/22/2015    Procedure: Coronary Stent Intervention;  Surgeon: Leonie Man, MD;  Location: [facility];   Service: Cardiovascular;  Laterality: N/A;  . Cardiac catheterization N/A 08/22/2015    Procedure: Left Heart Cath and Coronary Angiography;  Surgeon: Leonie Man, MD;  Location: [facility];  Service: Cardiovascular;  Laterality: N/A;    Social History   Social History  . Marital Status: Divorced    Spouse Name: N/A  . Number of Children: N/A  . Years of Education: N/A   Occupational History  . Not on file.   Social History Main Topics  . Smoking status: Former Smoker -- 25 years    Quit date: 07/03/2004  . Smokeless tobacco: Never Used  . Alcohol Use: Yes     Comment: ocassionally   . Drug Use: No  . Sexual Activity: Not on file   Other Topics Concern  . Not on file   Social History Narrative    No Known Allergies  Current Outpatient Prescriptions on File Prior to Visit  Medication Sig Dispense Refill  . acetaminophen (TYLENOL) 500 MG tablet Take 1,000 mg by mouth daily as needed for moderate pain.     Marland Kitchen aspirin EC 81 MG EC tablet Take 1 tablet (81 mg total) by mouth daily.    Marland Kitchen atorvastatin (LIPITOR) 80 MG tablet Take 1 tablet (80 mg total) by mouth daily at 6 PM. 30 tablet 11  . isosorbide mononitrate (IMDUR) 30 MG 24 hr tablet Take 1 tablet (30 mg total) by mouth daily. 30 tablet 5  . lisinopril (PRINIVIL,ZESTRIL) 20 MG  tablet Take 1 tablet (20 mg total) by mouth daily. 30 tablet 11  . metoprolol tartrate (LOPRESSOR) 100 MG tablet Take 1 tablet (100 mg total) by mouth 2 (two) times daily. 60 tablet 11  . nitroGLYCERIN (NITROSTAT) 0.4 MG SL tablet Place 1 tablet (0.4 mg total) under the tongue every 5 (five) minutes as needed for chest pain. 25 tablet 2  . ranitidine (ZANTAC) 150 MG capsule Take 150 mg by mouth daily as needed for heartburn.    . ticagrelor (BRILINTA) 90 MG TABS tablet Take 1 tablet (90 mg total) by mouth 2 (two) times daily. 60 tablet 11   No current facility-administered medications on file prior to visit.      Review of Systems    Constitutional: Negative for activity change, appetite change and fatigue.  HENT: Negative for congestion, sinus pressure and sore throat.   Eyes: Negative for visual disturbance.  Respiratory: Negative for cough, chest tightness, shortness of breath and wheezing.   Cardiovascular: Negative for chest pain and palpitations.  Gastrointestinal: Negative for abdominal pain, constipation and abdominal distention.  Endocrine: Negative for polydipsia.  Genitourinary: Negative for dysuria and frequency.  Musculoskeletal: Negative for back pain and arthralgias.  Skin: Negative for rash.  Neurological: Negative for tremors, light-headedness and numbness.  Hematological: Does not bruise/bleed easily.  Psychiatric/Behavioral: Negative for behavioral problems and agitation.       Objective: Filed Vitals:   09/12/15 1235  BP: 151/96  Pulse: 54  Temp: 98.3 F (36.8 C)  Height: 5\' 2"  (1.575 m)  Weight: 194 lb (87.998 kg)  SpO2: 98%      Physical Exam  Constitutional: She is oriented to person, place, and time. She appears well-developed and well-nourished. No distress.  Neck: Normal range of motion. No JVD present.  Cardiovascular: Bradycardic rate, regular rhythm, normal heart sounds and intact distal pulses.  Exam reveals no gallop.    No murmur heard. Pulmonary/Chest: Effort normal and breath sounds normal. No respiratory distress. She has no wheezes. She has no rales. She exhibits no tenderness.  Abdominal: Soft. Bowel sounds are normal. She exhibits no distension and no mass. There is no tenderness.  Musculoskeletal: Normal range of motion. She exhibits no edema or tenderness.  Neurological: She is alert and oriented to person, place, and time. She has normal reflexes.  Skin: Skin is warm and dry. She is not diaphoretic.       Assessment & Plan:  1. Type 2 diabetes mellitus, uncontrolled CBG 177, A1c 11.2 Discontinue metformin due to GI upset (she has continued to refuse  initiation of insulin) Increased dose of Amaryl from 2 to 4 mg  Blood sugar log will be reevaluated next office visit. Foot exam performed today, she refuses a flu shot, up-to-date on Pneumovax, advised to schedule annual eye exam. Microalbumin/creatinine sent off today     2. Dyslipidemia Continue statin for Secondary prevention  3. Coronary artery disease involving native coronary artery of native heart with unstable angina pectoris Status post DES stent Continue Brilinta Recently saw cardiology on 09/05/15  4. Essential hypertension Uncontrolled as blood pressure is greater than target of less than 140/90 Her blood pressure has been previously controlled with the most recent of 110/68 at her cardiology visit a week ago and so I will make no changes to her regimen.

## 2015-09-12 NOTE — Progress Notes (Signed)
Patient here for f/u on her DM2 She states she could not tolerate the metformin and stopped it last Thursday It made her nauseated and she vomited

## 2015-09-12 NOTE — Patient Instructions (Signed)
Diabetes Mellitus and Food It is important for you to manage your blood sugar (glucose) level. Your blood glucose level can be greatly affected by what you eat. Eating healthier foods in the appropriate amounts throughout the day at about the same time each day will help you control your blood glucose level. It can also help slow or prevent worsening of your diabetes mellitus. Healthy eating may even help you improve the level of your blood pressure and reach or maintain a healthy weight.  HOW CAN FOOD AFFECT ME? Carbohydrates Carbohydrates affect your blood glucose level more than any other type of food. Your dietitian will help you determine how many carbohydrates to eat at each meal and teach you how to count carbohydrates. Counting carbohydrates is important to keep your blood glucose at a healthy level, especially if you are using insulin or taking certain medicines for diabetes mellitus. Alcohol Alcohol can cause sudden decreases in blood glucose (hypoglycemia), especially if you use insulin or take certain medicines for diabetes mellitus. Hypoglycemia can be a life-threatening condition. Symptoms of hypoglycemia (sleepiness, dizziness, and disorientation) are similar to symptoms of having too much alcohol.  If your health care provider has given you approval to drink alcohol, do so in moderation and use the following guidelines:  Women should not have more than one drink per day, and men should not have more than two drinks per day. One drink is equal to:  12 oz of beer.  5 oz of wine.  1 oz of hard liquor.  Do not drink on an empty stomach.  Keep yourself hydrated. Have water, diet soda, or unsweetened iced tea.  Regular soda, juice, and other mixers might contain a lot of carbohydrates and should be counted. WHAT FOODS ARE NOT RECOMMENDED? As you make food choices, it is important to remember that all foods are not the same. Some foods have fewer nutrients per serving than other  foods, even though they might have the same number of calories or carbohydrates. It is difficult to get your body what it needs when you eat foods with fewer nutrients. Examples of foods that you should avoid that are high in calories and carbohydrates but low in nutrients include:  Trans fats (most processed foods list trans fats on the Nutrition Facts label).  Regular soda.  Juice.  Candy.  Sweets, such as cake, pie, doughnuts, and cookies.  Fried foods. WHAT FOODS CAN I EAT? Have nutrient-rich foods, which will nourish your body and keep you healthy. The food you should eat also will depend on several factors, including:  The calories you need.  The medicines you take.  Your weight.  Your blood glucose level.  Your blood pressure level.  Your cholesterol level. You also should eat a variety of foods, including:  Protein, such as meat, poultry, fish, tofu, nuts, and seeds (lean animal proteins are best).  Fruits.  Vegetables.  Dairy products, such as milk, cheese, and yogurt (low fat is best).  Breads, grains, pasta, cereal, rice, and beans.  Fats such as olive oil, trans fat-free margarine, canola oil, avocado, and olives. DOES EVERYONE WITH DIABETES MELLITUS HAVE THE SAME MEAL PLAN? Because every person with diabetes mellitus is different, there is not one meal plan that works for everyone. It is very important that you meet with a dietitian who will help you create a meal plan that is just right for you. Document Released: 08/29/2005 Document Revised: 12/07/2013 Document Reviewed: 10/29/2013 Exit[employer] Patient Information 2015 [employer]. This   information is not intended to replace advice given to you by your health care provider. Make sure you discuss any questions you have with your health care provider.  

## 2015-09-13 LAB — MICROALBUMIN / CREATININE URINE RATIO
CREATININE, URINE: 120 mg/dL
Microalb Creat Ratio: 12.5 mg/g (ref 0.0–30.0)
Microalb, Ur: 1.5 mg/dL (ref <2.0)

## 2015-09-14 ENCOUNTER — Telehealth (HOSPITAL_COMMUNITY): Payer: Self-pay | Admitting: *Deleted

## 2015-09-14 NOTE — Telephone Encounter (Signed)
Received signed MD order for cardiac rehab. Called and left message to please contact for sign up to cardiac rehab.Cherre Huger, BSN

## 2015-09-15 ENCOUNTER — Telehealth: Payer: Self-pay | Admitting: General Practice

## 2015-09-15 ENCOUNTER — Telehealth: Payer: Self-pay | Admitting: *Deleted

## 2015-09-15 NOTE — Telephone Encounter (Signed)
-----   Message from Arnoldo Morale, MD sent at 09/13/2015  8:23 AM EDT ----- Please inform the patient that labs are normal. Thank you.

## 2015-09-15 NOTE — Telephone Encounter (Signed)
Patient returning your call to obtain results. Please follow up with pt.

## 2015-09-15 NOTE — Telephone Encounter (Signed)
Left HIPAA compliant message for patient to return call to Anna, RN at [phone] 

## 2015-09-28 ENCOUNTER — Other Ambulatory Visit: Payer: Self-pay | Admitting: Cardiology

## 2015-09-28 MED ORDER — TICAGRELOR 90 MG PO TABS: 90.0000 mg | ORAL_TABLET | Freq: Two times a day (BID) | ORAL | Status: DC

## 2015-09-28 NOTE — Telephone Encounter (Signed)
Spoke with patient. She needs printed Brilinta Rx signed by MD and faxed in for patient assistance.   Phone: [phone] Fax: [phone]  Rx ordered, printed, given to B. Lassiter for MD to sign on 10/17

## 2015-09-28 NOTE — Telephone Encounter (Signed)
°  STAT if patient is at the pharmacy , call can be transferred to refill team.   1. Which medications need to be refilled? Brilinta-need prescription  fax to the company with a cover sheet  2. Which pharmacy/location is medication to be sent to?Brilinta-[phone]  3. Do they need a 30 day or 90 day supply? Whatever is the original amount

## 2015-10-02 ENCOUNTER — Telehealth: Payer: Self-pay | Admitting: Cardiovascular Disease

## 2015-10-02 NOTE — Telephone Encounter (Signed)
Returned call to patient Brilinta prescription faxed to insurance.Brilinta 90 mg samples left at front desk for pick up.

## 2015-10-02 NOTE — Telephone Encounter (Signed)
Pt called in wanting to know if the doctor faxed her prescription for Brilinta to the company. She says that she is apart of a group that allows her to get the medication for free but she needs for Dr. Loletha Grayer to fax her prescription to [facility]  Thanks

## 2015-10-03 NOTE — Telephone Encounter (Signed)
Rx faxed on 10/02/15 to [phone] by C. Truitt, CMA

## 2015-10-06 ENCOUNTER — Ambulatory Visit: Payer: MEDICAID | Attending: Family Medicine | Admitting: Family Medicine

## 2015-10-06 ENCOUNTER — Encounter: Payer: Self-pay | Admitting: Family Medicine

## 2015-10-06 VITALS — BP 148/84 | HR 72 | Temp 98.3°F | Resp 18 | Ht 62.0 in | Wt 190.0 lb

## 2015-10-06 DIAGNOSIS — I252 Old myocardial infarction: Secondary | ICD-10-CM | POA: Insufficient documentation

## 2015-10-06 DIAGNOSIS — Z7982 Long term (current) use of aspirin: Secondary | ICD-10-CM | POA: Insufficient documentation

## 2015-10-06 DIAGNOSIS — K219 Gastro-esophageal reflux disease without esophagitis: Secondary | ICD-10-CM | POA: Insufficient documentation

## 2015-10-06 DIAGNOSIS — I2510 Atherosclerotic heart disease of native coronary artery without angina pectoris: Secondary | ICD-10-CM

## 2015-10-06 DIAGNOSIS — E1165 Type 2 diabetes mellitus with hyperglycemia: Secondary | ICD-10-CM

## 2015-10-06 DIAGNOSIS — Z79899 Other long term (current) drug therapy: Secondary | ICD-10-CM | POA: Insufficient documentation

## 2015-10-06 DIAGNOSIS — Z9861 Coronary angioplasty status: Secondary | ICD-10-CM

## 2015-10-06 DIAGNOSIS — Z955 Presence of coronary angioplasty implant and graft: Secondary | ICD-10-CM | POA: Insufficient documentation

## 2015-10-06 DIAGNOSIS — Z87891 Personal history of nicotine dependence: Secondary | ICD-10-CM | POA: Insufficient documentation

## 2015-10-06 DIAGNOSIS — E785 Hyperlipidemia, unspecified: Secondary | ICD-10-CM

## 2015-10-06 DIAGNOSIS — Z7984 Long term (current) use of oral hypoglycemic drugs: Secondary | ICD-10-CM | POA: Insufficient documentation

## 2015-10-06 DIAGNOSIS — IMO0001 Reserved for inherently not codable concepts without codable children: Secondary | ICD-10-CM

## 2015-10-06 DIAGNOSIS — I10 Essential (primary) hypertension: Secondary | ICD-10-CM

## 2015-10-06 DIAGNOSIS — I25110 Atherosclerotic heart disease of native coronary artery with unstable angina pectoris: Secondary | ICD-10-CM | POA: Insufficient documentation

## 2015-10-06 LAB — GLUCOSE, POCT (MANUAL RESULT ENTRY): POC Glucose: 214 mg/dl — AB (ref 70–99)

## 2015-10-06 MED ORDER — CANAGLIFLOZIN 100 MG PO TABS: 100.0000 mg | ORAL_TABLET | Freq: Every day | ORAL | Status: DC

## 2015-10-06 NOTE — Progress Notes (Signed)
Subjective:    Patient ID: [name], female    DOB: October 01, 1956, 59 y.o.   MRN: [id number]  HPI Luceal Hollibaugh is a 59 year old woman with a history of uncontrolled type 2 diabetes mellitus (A1c 11.2), dyslipidemia, coronary artery disease status post DES stent (in 08/2015, currently on Brilinta), essential hypertension who comes in today for a follow-up visit.  She had previously been unable to tolerate metformin has also refused initiation of insulin despite uncontrolled sugars and so Amaryl was increased from 2 mg to 4 mg at her last office visit. Blood sugar log today reveals fasting sugars range from 150-201 despite compliance with medication. She works second shift from 3 PM to 11 PM and admits to a nibbling on her meal throughout the whole shift but does not eat after 9 PM. She denies hypoglycemic episodes.  Remains on lisinopril for hypertension. She does not do much exercise.  Past Medical History  Diagnosis Date  . Diabetes mellitus without complication ([place])   . Heart attack ([place])   . GERD (gastroesophageal reflux disease)   . Hyperlipidemia LDL goal < 70 06/07/2013  . Coronary atherosclerosis of native coronary artery, with stent to LCX in 2006 06/04/2013  . S/P coronary artery stent placement to RCA with PROMUS DES, 06/07/13, residual disease on OM1, OM2 and rPDA 06/07/2013  . Anginal pain ([place])   . Hypertension   . Arthritis     Past Surgical History  Procedure Laterality Date  . Coronary stent placement  approx 2005    1 stent  . Left heart catheterization with coronary angiogram N/A 06/07/2013    Procedure: LEFT HEART CATHETERIZATION WITH CORONARY ANGIOGRAM;  Surgeon: Sanda Klein, MD;  Location: [facility];  Service: Cardiovascular;  Laterality: N/A;  . Cardiac catheterization N/A 08/22/2015    Procedure: Left Heart Cath and Coronary Angiography;  Surgeon: Leonie Man, MD;  Location: [facility];  Service: Cardiovascular;  Laterality: N/A;  . Cardiac  catheterization N/A 08/22/2015    Procedure: Coronary Stent Intervention;  Surgeon: Leonie Man, MD;  Location: [facility];  Service: Cardiovascular;  Laterality: N/A;  . Cardiac catheterization N/A 08/22/2015    Procedure: Left Heart Cath and Coronary Angiography;  Surgeon: Leonie Man, MD;  Location: [facility];  Service: Cardiovascular;  Laterality: N/A;    Social History   Social History  . Marital Status: Divorced    Spouse Name: N/A  . Number of Children: N/A  . Years of Education: N/A   Occupational History  . Not on file.   Social History Main Topics  . Smoking status: Former Smoker -- 25 years    Quit date: 07/03/2004  . Smokeless tobacco: Never Used  . Alcohol Use: Yes     Comment: ocassionally   . Drug Use: No  . Sexual Activity: Not on file   Other Topics Concern  . Not on file   Social History Narrative    No Known Allergies  Current Outpatient Prescriptions on File Prior to Visit  Medication Sig Dispense Refill  . acetaminophen (TYLENOL) 500 MG tablet Take 1,000 mg by mouth daily as needed for moderate pain.     Marland Kitchen aspirin EC 81 MG EC tablet Take 1 tablet (81 mg total) by mouth daily.    Marland Kitchen atorvastatin (LIPITOR) 80 MG tablet Take 1 tablet (80 mg total) by mouth daily at 6 PM. 30 tablet 11  . glimepiride (AMARYL) 4 MG tablet  Take 1 tablet (4 mg total) by mouth daily with breakfast. 30 tablet 2  . isosorbide mononitrate (IMDUR) 30 MG 24 hr tablet Take 1 tablet (30 mg total) by mouth daily. 30 tablet 5  . lisinopril (PRINIVIL,ZESTRIL) 20 MG tablet Take 1 tablet (20 mg total) by mouth daily. 30 tablet 11  . metoprolol tartrate (LOPRESSOR) 100 MG tablet Take 1 tablet (100 mg total) by mouth 2 (two) times daily. 60 tablet 11  . nitroGLYCERIN (NITROSTAT) 0.4 MG SL tablet Place 1 tablet (0.4 mg total) under the tongue every 5 (five) minutes as needed for chest pain. 25 tablet 2  . ranitidine (ZANTAC) 150 MG capsule Take 150 mg by mouth daily as  needed for heartburn.    . ticagrelor (BRILINTA) 90 MG TABS tablet Take 1 tablet (90 mg total) by mouth 2 (two) times daily. 180 tablet 3   No current facility-administered medications on file prior to visit.      Review of Systems Constitutional: Negative for activity change, appetite change and fatigue.  HENT: Negative for congestion, sinus pressure and sore throat.   Eyes: Negative for visual disturbance.  Respiratory: Negative for cough, chest tightness, shortness of breath and wheezing.   Cardiovascular: Negative for chest pain and palpitations.  Gastrointestinal: Negative for abdominal pain, constipation and abdominal distention.  Endocrine: Negative for polydipsia.  Genitourinary: Negative for dysuria and frequency.  Musculoskeletal: Negative for back pain and arthralgias.  Skin: Negative for rash.  Neurological: Negative for tremors, light-headedness and numbness.  Hematological: Does not bruise/bleed easily.  Psychiatric/Behavioral: Negative for behavioral problems and agitation.    Objective: Filed Vitals:   10/06/15 0923  BP: 148/84  Pulse: 72  Temp: 98.3 F (36.8 C)  TempSrc: Oral  Resp: 18  Height: 5\' 2"  (1.575 m)  Weight: 190 lb (86.183 kg)  SpO2: 100%      Physical Exam Constitutional: She is oriented to person, place, and time. She appears well-developed and well-nourished. No distress.  Neck: Normal range of motion. No JVD present.  Cardiovascular: Bradycardic rate, regular rhythm, normal heart sounds and intact distal pulses.  Exam reveals no gallop.    No murmur heard. Pulmonary/Chest: Effort normal and breath sounds normal. No respiratory distress. She has no wheezes. She has no rales. She exhibits no tenderness.  Abdominal: Soft. Bowel sounds are normal. She exhibits no distension and no mass. There is no tenderness.  Musculoskeletal: Normal range of motion. She exhibits no edema or tenderness.  Neurological: She is alert and oriented to person,  place, and time. She has normal reflexes.  Skin: Skin is warm and dry. She is not diaphoretic.         Assessment & Plan:  1. Type 2 diabetes mellitus, uncontrolled CBG 214, A1c 11.2 Discontinue metformin due to GI upset (she has continued to refuse initiation of insulin) Increased dose of Amaryl from 2 to 4 mg at her last visit Commenced on Invokana and I will review her blood sugar log at her next office visit.   2. Dyslipidemia Continue statin for Secondary prevention  3. Coronary artery disease involving native coronary artery of native heart with unstable angina pectoris Status post DES stent Continue Brilinta Recently saw cardiology on 09/05/15  4. Essential hypertension Improved but not at goal of less than 140/90  Continue low-sodium, DASH diet as well as exercise   This note has been created with Surveyor, quantity. Any transcriptional errors are unintentional.

## 2015-10-06 NOTE — Progress Notes (Signed)
Pt's here for ED f/up for DM. Pt's c/o having diarrhea x1day.   Pt states that she ate lasagna last night and think it maybe the cause of her diarrhea.  Pt need rx on Brilinta.  Pt would like to est. care with a PCP

## 2015-10-06 NOTE — Patient Instructions (Signed)
Diabetes Mellitus and Food It is important for you to manage your blood sugar (glucose) level. Your blood glucose level can be greatly affected by what you eat. Eating healthier foods in the appropriate amounts throughout the day at about the same time each day will help you control your blood glucose level. It can also help slow or prevent worsening of your diabetes mellitus. Healthy eating may even help you improve the level of your blood pressure and reach or maintain a healthy weight.  General recommendations for healthful eating and cooking habits include:  Eating meals and snacks regularly. Avoid going long periods of time without eating to lose weight.  Eating a diet that consists mainly of plant-based foods, such as fruits, vegetables, nuts, legumes, and whole grains.  Using low-heat cooking methods, such as baking, instead of high-heat cooking methods, such as deep frying. Work with your dietitian to make sure you understand how to use the Nutrition Facts information on food labels. HOW CAN FOOD AFFECT ME? Carbohydrates Carbohydrates affect your blood glucose level more than any other type of food. Your dietitian will help you determine how many carbohydrates to eat at each meal and teach you how to count carbohydrates. Counting carbohydrates is important to keep your blood glucose at a healthy level, especially if you are using insulin or taking certain medicines for diabetes mellitus. Alcohol Alcohol can cause sudden decreases in blood glucose (hypoglycemia), especially if you use insulin or take certain medicines for diabetes mellitus. Hypoglycemia can be a life-threatening condition. Symptoms of hypoglycemia (sleepiness, dizziness, and disorientation) are similar to symptoms of having too much alcohol.  If your health care provider has given you approval to drink alcohol, do so in moderation and use the following guidelines:  Women should not have more than one drink per day, and men  should not have more than two drinks per day. One drink is equal to:  12 oz of beer.  5 oz of wine.  1 oz of hard liquor.  Do not drink on an empty stomach.  Keep yourself hydrated. Have water, diet soda, or unsweetened iced tea.  Regular soda, juice, and other mixers might contain a lot of carbohydrates and should be counted. WHAT FOODS ARE NOT RECOMMENDED? As you make food choices, it is important to remember that all foods are not the same. Some foods have fewer nutrients per serving than other foods, even though they might have the same number of calories or carbohydrates. It is difficult to get your body what it needs when you eat foods with fewer nutrients. Examples of foods that you should avoid that are high in calories and carbohydrates but low in nutrients include:  Trans fats (most processed foods list trans fats on the Nutrition Facts label).  Regular soda.  Juice.  Candy.  Sweets, such as cake, pie, doughnuts, and cookies.  Fried foods. WHAT FOODS CAN I EAT? Eat nutrient-rich foods, which will nourish your body and keep you healthy. The food you should eat also will depend on several factors, including:  The calories you need.  The medicines you take.  Your weight.  Your blood glucose level.  Your blood pressure level.  Your cholesterol level. You should eat a variety of foods, including:  Protein.  Lean cuts of meat.  Proteins low in saturated fats, such as fish, egg whites, and beans. Avoid processed meats.  Fruits and vegetables.  Fruits and vegetables that may help control blood glucose levels, such as apples, mangoes, and   yams.  Dairy products.  Choose fat-free or low-fat dairy products, such as milk, yogurt, and cheese.  Grains, bread, pasta, and rice.  Choose whole grain products, such as multigrain bread, whole oats, and brown rice. These foods may help control blood pressure.  Fats.  Foods containing healthful fats, such as nuts,  avocado, olive oil, canola oil, and fish. DOES EVERYONE WITH DIABETES MELLITUS HAVE THE SAME MEAL PLAN? Because every person with diabetes mellitus is different, there is not one meal plan that works for everyone. It is very important that you meet with a dietitian who will help you create a meal plan that is just right for you.   This information is not intended to replace advice given to you by your health care provider. Make sure you discuss any questions you have with your health care provider.   Document Released: 08/29/2005 Document Revised: 12/23/2014 Document Reviewed: 10/29/2013 Elsevier Interactive Patient Education 2016 [employer]  

## 2015-11-03 ENCOUNTER — Ambulatory Visit: Payer: MEDICAID | Attending: Family Medicine | Admitting: Family Medicine

## 2015-11-03 ENCOUNTER — Encounter: Payer: Self-pay | Admitting: Family Medicine

## 2015-11-03 VITALS — BP 116/76 | HR 59 | Temp 97.9°F | Resp 18 | Ht 62.0 in | Wt 194.0 lb

## 2015-11-03 DIAGNOSIS — I10 Essential (primary) hypertension: Secondary | ICD-10-CM

## 2015-11-03 DIAGNOSIS — E1165 Type 2 diabetes mellitus with hyperglycemia: Secondary | ICD-10-CM

## 2015-11-03 DIAGNOSIS — I2510 Atherosclerotic heart disease of native coronary artery without angina pectoris: Secondary | ICD-10-CM

## 2015-11-03 DIAGNOSIS — Z9861 Coronary angioplasty status: Secondary | ICD-10-CM

## 2015-11-03 DIAGNOSIS — E785 Hyperlipidemia, unspecified: Secondary | ICD-10-CM

## 2015-11-03 DIAGNOSIS — IMO0001 Reserved for inherently not codable concepts without codable children: Secondary | ICD-10-CM

## 2015-11-03 LAB — GLUCOSE, POCT (MANUAL RESULT ENTRY): POC Glucose: 192 mg/dl — AB (ref 70–99)

## 2015-11-03 MED ORDER — GLIMEPIRIDE 4 MG PO TABS: 4.0000 mg | ORAL_TABLET | Freq: Two times a day (BID) | ORAL | Status: DC

## 2015-11-03 NOTE — Progress Notes (Signed)
Pt's here for f/up DM2. Pt denies pain.  Pt concern with current meds invokona.  Pt says that her levels been running high since being on invokona.

## 2015-11-03 NOTE — Progress Notes (Signed)
Subjective:    Patient ID: [name], female    DOB: 1956/05/24, 59 y.o.   MRN: [id number]  HPI [name] is a 59 year old female with a history of uncontrolled type 2 diabetes mellitus (A1c 11.2), dyslipidemia, coronary artery disease status post DES stent (in 08/2015, currently on Brilinta), essential hypertension who comes in today for a follow-up visit  She complains of blood sugar remaining elevated despite initiation of either current or. Blood sugar log reveals fasting sugars in the 170-180 range and random sugars up to 331. She denies hypoglycemic episodes.  Her blood pressure which was elevated at her last office visit is controlled today. Her cardiologist is Dr. Stanford Breed; she denies any chest pains.  Past Medical History  Diagnosis Date  . Diabetes mellitus without complication ([place])   . Heart attack ([place])   . GERD (gastroesophageal reflux disease)   . Hyperlipidemia LDL goal < 70 06/07/2013  . Coronary atherosclerosis of native coronary artery, with stent to LCX in 2006 06/04/2013  . S/P coronary artery stent placement to RCA with PROMUS DES, 06/07/13, residual disease on OM1, OM2 and rPDA 06/07/2013  . Anginal pain ([place])   . Hypertension   . Arthritis     Past Surgical History  Procedure Laterality Date  . Coronary stent placement  approx 2005    1 stent  . Left heart catheterization with coronary angiogram N/A 06/07/2013    Procedure: LEFT HEART CATHETERIZATION WITH CORONARY ANGIOGRAM;  Surgeon: Sanda Klein, MD;  Location: [facility];  Service: Cardiovascular;  Laterality: N/A;  . Cardiac catheterization N/A 08/22/2015    Procedure: Left Heart Cath and Coronary Angiography;  Surgeon: Leonie Man, MD;  Location: [facility];  Service: Cardiovascular;  Laterality: N/A;  . Cardiac catheterization N/A 08/22/2015    Procedure: Coronary Stent Intervention;  Surgeon: Leonie Man, MD;  Location: [facility];  Service: Cardiovascular;  Laterality: N/A;   . Cardiac catheterization N/A 08/22/2015    Procedure: Left Heart Cath and Coronary Angiography;  Surgeon: Leonie Man, MD;  Location: [facility];  Service: Cardiovascular;  Laterality: N/A;    Social History   Social History  . Marital Status: Divorced    Spouse Name: N/A  . Number of Children: N/A  . Years of Education: N/A   Occupational History  . Not on file.   Social History Main Topics  . Smoking status: Former Smoker -- 25 years    Quit date: 07/03/2004  . Smokeless tobacco: Never Used  . Alcohol Use: Yes     Comment: ocassionally   . Drug Use: No  . Sexual Activity: Not on file   Other Topics Concern  . Not on file   Social History Narrative    No Known Allergies  Current Outpatient Prescriptions on File Prior to Visit  Medication Sig Dispense Refill  . acetaminophen (TYLENOL) 500 MG tablet Take 1,000 mg by mouth daily as needed for moderate pain.     Marland Kitchen aspirin EC 81 MG EC tablet Take 1 tablet (81 mg total) by mouth daily.    Marland Kitchen atorvastatin (LIPITOR) 80 MG tablet Take 1 tablet (80 mg total) by mouth daily at 6 PM. 30 tablet 11  . canagliflozin (INVOKANA) 100 MG TABS tablet Take 1 tablet (100 mg total) by mouth daily. 90 tablet 3  . isosorbide mononitrate (IMDUR) 30 MG 24 hr tablet Take 1 tablet (30 mg total) by mouth daily. 30 tablet 5  .  lisinopril (PRINIVIL,ZESTRIL) 20 MG tablet Take 1 tablet (20 mg total) by mouth daily. 30 tablet 11  . metoprolol tartrate (LOPRESSOR) 100 MG tablet Take 1 tablet (100 mg total) by mouth 2 (two) times daily. 60 tablet 11  . nitroGLYCERIN (NITROSTAT) 0.4 MG SL tablet Place 1 tablet (0.4 mg total) under the tongue every 5 (five) minutes as needed for chest pain. 25 tablet 2  . ranitidine (ZANTAC) 150 MG capsule Take 150 mg by mouth daily as needed for heartburn.    . ticagrelor (BRILINTA) 90 MG TABS tablet Take 1 tablet (90 mg total) by mouth 2 (two) times daily. 180 tablet 3   No current facility-administered  medications on file prior to visit.      Review of Systems  Constitutional: Negative for activity change, appetite change and fatigue.  HENT: Negative for congestion, sinus pressure and sore throat.   Eyes: Negative for visual disturbance.  Respiratory: Negative for cough, chest tightness, shortness of breath and wheezing.   Cardiovascular: Negative for chest pain and palpitations.  Gastrointestinal: Negative for abdominal pain, constipation and abdominal distention.  Endocrine: Negative for polydipsia.  Genitourinary: Negative for dysuria and frequency.  Musculoskeletal: Negative for back pain and arthralgias.  Skin: Negative for rash.  Neurological: Negative for tremors, light-headedness and numbness.  Hematological: Does not bruise/bleed easily.  Psychiatric/Behavioral: Negative for behavioral problems and agitation.       Objective: Filed Vitals:   11/03/15 0917  BP: 116/76  Pulse: 59  Temp: 97.9 F (36.6 C)  TempSrc: Oral  Resp: 18  Height: 5\' 2"  (1.575 m)  Weight: 194 lb (87.998 kg)  SpO2: 100%      Physical Exam  Constitutional: She is oriented to person, place, and time. She appears well-developed and well-nourished.  Cardiovascular: Normal heart sounds and intact distal pulses.  Bradycardia present.   No murmur heard. Pulmonary/Chest: Effort normal and breath sounds normal. She has no wheezes. She has no rales. She exhibits no tenderness.  Abdominal: Soft. Bowel sounds are normal. She exhibits no distension and no mass. There is no tenderness.  Musculoskeletal: Normal range of motion.  Neurological: She is alert and oriented to person, place, and time.  Skin: Skin is warm and dry.  Psychiatric: She has a normal mood and affect.          Assessment & Plan:  1. Type 2 diabetes mellitus, uncontrolled  A1c 11.2 Increased dose of Amaryl to 4 mg twice daily. She has refused initiation of insulin; if blood sugars are still high at her next visit I will place  her back on a low-dose of metformin (she was previously nauseated with metformin which was discontinued) Continue Invokana   2. Dyslipidemia Continue statin for Secondary prevention  3. Coronary artery disease involving native coronary artery of native heart with unstable angina pectoris Status post DES stent Continue Brilinta Upcoming appointment with cardiology-Dr. Stanford Breed next month  4. Essential hypertension Controlled. Continue low-sodium, DASH diet as well as exercise   This note has been created with Surveyor, quantity. Any transcriptional errors are unintentional.

## 2015-11-03 NOTE — Patient Instructions (Signed)
Diabetes Mellitus and Food It is important for you to manage your blood sugar (glucose) level. Your blood glucose level can be greatly affected by what you eat. Eating healthier foods in the appropriate amounts throughout the day at about the same time each day will help you control your blood glucose level. It can also help slow or prevent worsening of your diabetes mellitus. Healthy eating may even help you improve the level of your blood pressure and reach or maintain a healthy weight.  General recommendations for healthful eating and cooking habits include:  Eating meals and snacks regularly. Avoid going long periods of time without eating to lose weight.  Eating a diet that consists mainly of plant-based foods, such as fruits, vegetables, nuts, legumes, and whole grains.  Using low-heat cooking methods, such as baking, instead of high-heat cooking methods, such as deep frying. Work with your dietitian to make sure you understand how to use the Nutrition Facts information on food labels. HOW CAN FOOD AFFECT ME? Carbohydrates Carbohydrates affect your blood glucose level more than any other type of food. Your dietitian will help you determine how many carbohydrates to eat at each meal and teach you how to count carbohydrates. Counting carbohydrates is important to keep your blood glucose at a healthy level, especially if you are using insulin or taking certain medicines for diabetes mellitus. Alcohol Alcohol can cause sudden decreases in blood glucose (hypoglycemia), especially if you use insulin or take certain medicines for diabetes mellitus. Hypoglycemia can be a life-threatening condition. Symptoms of hypoglycemia (sleepiness, dizziness, and disorientation) are similar to symptoms of having too much alcohol.  If your health care provider has given you approval to drink alcohol, do so in moderation and use the following guidelines:  Women should not have more than one drink per day, and men  should not have more than two drinks per day. One drink is equal to:  12 oz of beer.  5 oz of wine.  1 oz of hard liquor.  Do not drink on an empty stomach.  Keep yourself hydrated. Have water, diet soda, or unsweetened iced tea.  Regular soda, juice, and other mixers might contain a lot of carbohydrates and should be counted. WHAT FOODS ARE NOT RECOMMENDED? As you make food choices, it is important to remember that all foods are not the same. Some foods have fewer nutrients per serving than other foods, even though they might have the same number of calories or carbohydrates. It is difficult to get your body what it needs when you eat foods with fewer nutrients. Examples of foods that you should avoid that are high in calories and carbohydrates but low in nutrients include:  Trans fats (most processed foods list trans fats on the Nutrition Facts label).  Regular soda.  Juice.  Candy.  Sweets, such as cake, pie, doughnuts, and cookies.  Fried foods. WHAT FOODS CAN I EAT? Eat nutrient-rich foods, which will nourish your body and keep you healthy. The food you should eat also will depend on several factors, including:  The calories you need.  The medicines you take.  Your weight.  Your blood glucose level.  Your blood pressure level.  Your cholesterol level. You should eat a variety of foods, including:  Protein.  Lean cuts of meat.  Proteins low in saturated fats, such as fish, egg whites, and beans. Avoid processed meats.  Fruits and vegetables.  Fruits and vegetables that may help control blood glucose levels, such as apples, mangoes, and   yams.  Dairy products.  Choose fat-free or low-fat dairy products, such as milk, yogurt, and cheese.  Grains, bread, pasta, and rice.  Choose whole grain products, such as multigrain bread, whole oats, and brown rice. These foods may help control blood pressure.  Fats.  Foods containing healthful fats, such as nuts,  avocado, olive oil, canola oil, and fish. DOES EVERYONE WITH DIABETES MELLITUS HAVE THE SAME MEAL PLAN? Because every person with diabetes mellitus is different, there is not one meal plan that works for everyone. It is very important that you meet with a dietitian who will help you create a meal plan that is just right for you.   This information is not intended to replace advice given to you by your health care provider. Make sure you discuss any questions you have with your health care provider.   Document Released: 08/29/2005 Document Revised: 12/23/2014 Document Reviewed: 10/29/2013 Elsevier Interactive Patient Education 2016 [employer]  

## 2015-11-08 ENCOUNTER — Other Ambulatory Visit: Payer: Self-pay | Admitting: Internal Medicine

## 2015-11-08 MED ORDER — TICAGRELOR 90 MG PO TABS: 90.0000 mg | ORAL_TABLET | Freq: Two times a day (BID) | ORAL | Status: DC

## 2015-11-24 NOTE — Telephone Encounter (Signed)
RN out on FMLA beginning 09/25/15.  Patient had appointment with Dr. Jarold Song in October.

## 2015-12-06 ENCOUNTER — Encounter: Payer: Self-pay | Admitting: Cardiovascular Disease

## 2015-12-06 ENCOUNTER — Ambulatory Visit (INDEPENDENT_AMBULATORY_CARE_PROVIDER_SITE_OTHER): Payer: Self-pay | Admitting: Cardiovascular Disease

## 2015-12-06 VITALS — BP 162/90 | HR 71 | Ht 62.0 in | Wt 203.1 lb

## 2015-12-06 DIAGNOSIS — I25118 Atherosclerotic heart disease of native coronary artery with other forms of angina pectoris: Secondary | ICD-10-CM

## 2015-12-06 DIAGNOSIS — E1159 Type 2 diabetes mellitus with other circulatory complications: Secondary | ICD-10-CM

## 2015-12-06 DIAGNOSIS — E1165 Type 2 diabetes mellitus with hyperglycemia: Secondary | ICD-10-CM

## 2015-12-06 DIAGNOSIS — I5033 Acute on chronic diastolic (congestive) heart failure: Secondary | ICD-10-CM

## 2015-12-06 DIAGNOSIS — I110 Hypertensive heart disease with heart failure: Secondary | ICD-10-CM

## 2015-12-06 DIAGNOSIS — IMO0002 Reserved for concepts with insufficient information to code with codable children: Secondary | ICD-10-CM

## 2015-12-06 DIAGNOSIS — I5032 Chronic diastolic (congestive) heart failure: Secondary | ICD-10-CM | POA: Insufficient documentation

## 2015-12-06 MED ORDER — FUROSEMIDE 40 MG PO TABS: 40.0000 mg | ORAL_TABLET | Freq: Every day | ORAL | Status: DC

## 2015-12-06 MED ORDER — LISINOPRIL 40 MG PO TABS: 40.0000 mg | ORAL_TABLET | Freq: Every day | ORAL | Status: DC

## 2015-12-06 NOTE — Patient Instructions (Signed)
Your physician has recommended you make the following change in your medication:   INCREASE LISINOPRIL TO 40 MG DAILY  (A NEW RX HAS BEEN SENT TO YOUR PHARMACY FOR THE NEW STRENGTH).  START FUROSEMIDE 40 MG DAILY  Your physician recommends that you schedule a follow-up appointment in: [place] [facility] [place].  Dr. Sallyanne Kuster recommends that you schedule a follow-up appointment in: 3 MONTHS  Low-Sodium Eating Plan Sodium raises blood pressure and causes water to be held in the body. Getting less sodium from food will help lower your blood pressure, reduce any swelling, and protect your heart, liver, and kidneys. We get sodium by adding salt (sodium chloride) to food. Most of our sodium comes from canned, boxed, and frozen foods. Restaurant foods, fast foods, and pizza are also very high in sodium. Even if you take medicine to lower your blood pressure or to reduce fluid in your body, getting less sodium from your food is important. WHAT IS MY PLAN? Most people should limit their sodium intake to 2,300 mg a day. Your health care provider recommends that you limit your sodium intake to ____2000______ a day.  WHAT DO I NEED TO KNOW ABOUT THIS EATING PLAN? For the low-sodium eating plan, you will follow these general guidelines:  Choose foods with a % Daily Value for sodium of less than 5% (as listed on the food label).   Use salt-free seasonings or herbs instead of table salt or sea salt.   Check with your health care provider or pharmacist before using salt substitutes.   Eat fresh foods.  Eat more vegetables and fruits.  Limit canned vegetables. If you do use them, rinse them well to decrease the sodium.   Limit cheese to 1 oz (28 g) per day.   Eat lower-sodium products, often labeled as "lower sodium" or "no salt added."  Avoid foods that contain monosodium glutamate (MSG). MSG is sometimes added to [place] food and some canned foods.  Check food labels (Nutrition Facts labels) on  foods to learn how much sodium is in one serving.  Eat more home-cooked food and less restaurant, buffet, and fast food.  When eating at a restaurant, ask that your food be prepared with less salt, or no salt if possible.  HOW DO I READ FOOD LABELS FOR SODIUM INFORMATION? The Nutrition Facts label lists the amount of sodium in one serving of the food. If you eat more than one serving, you must multiply the listed amount of sodium by the number of servings. Food labels may also identify foods as:  Sodium free--Less than 5 mg in a serving.  Very low sodium--35 mg or less in a serving.  Low sodium--140 mg or less in a serving.  Light in sodium--50% less sodium in a serving. For example, if a food that usually has 300 mg of sodium is changed to become light in sodium, it will have 150 mg of sodium.  Reduced sodium--25% less sodium in a serving. For example, if a food that usually has 400 mg of sodium is changed to reduced sodium, it will have 300 mg of sodium. WHAT FOODS CAN I EAT? Grains Low-sodium cereals, including oats, puffed wheat and rice, and shredded wheat cereals. Low-sodium crackers. Unsalted rice and pasta. Lower-sodium bread.  Vegetables Frozen or fresh vegetables. Low-sodium or reduced-sodium canned vegetables. Low-sodium or reduced-sodium tomato sauce and paste. Low-sodium or reduced-sodium tomato and vegetable juices.  Fruits Fresh, frozen, and canned fruit. Fruit juice.  Meat and Other Protein  Products Low-sodium canned tuna and salmon. Fresh or frozen meat, poultry, seafood, and fish. Lamb. Unsalted nuts. Dried beans, peas, and lentils without added salt. Unsalted canned beans. Homemade soups without salt. Eggs.  Dairy Milk. Soy milk. Ricotta cheese. Low-sodium or reduced-sodium cheeses. Yogurt.  Condiments Fresh and dried herbs and spices. Salt-free seasonings. Onion and garlic powders. Low-sodium varieties of mustard and ketchup. Fresh or refrigerated  horseradish. Lemon juice.  Fats and Oils Reduced-sodium salad dressings. Unsalted butter.  Other Unsalted popcorn and pretzels.  The items listed above may not be a complete list of recommended foods or beverages. Contact your dietitian for more options. WHAT FOODS ARE NOT RECOMMENDED? Grains Instant hot cereals. Bread stuffing, pancake, and biscuit mixes. Croutons. Seasoned rice or pasta mixes. Noodle soup cups. Boxed or frozen macaroni and cheese. Self-rising flour. Regular salted crackers. Vegetables Regular canned vegetables. Regular canned tomato sauce and paste. Regular tomato and vegetable juices. Frozen vegetables in sauces. Salted [place] fries. Olives. Angie Fava. Relishes. Sauerkraut. Salsa. Meat and Other Protein Products Salted, canned, smoked, spiced, or pickled meats, seafood, or fish. Bacon, ham, sausage, hot dogs, corned beef, chipped beef, and packaged luncheon meats. Salt pork. Jerky. Pickled herring. Anchovies, regular canned tuna, and sardines. Salted nuts. Dairy Processed cheese and cheese spreads. Cheese curds. Blue cheese and cottage cheese. Buttermilk.  Condiments Onion and garlic salt, seasoned salt, table salt, and sea salt. Canned and packaged gravies. Worcestershire sauce. Tartar sauce. Barbecue sauce. Teriyaki sauce. Soy sauce, including reduced sodium. Steak sauce. Fish sauce. Oyster sauce. Cocktail sauce. Horseradish that you find on the shelf. Regular ketchup and mustard. Meat flavorings and tenderizers. Bouillon cubes. Hot sauce. Tabasco sauce. Marinades. Taco seasonings. Relishes. Fats and Oils Regular salad dressings. Salted butter. Margarine. Ghee. Bacon fat.  Other Potato and tortilla chips. Corn chips and puffs. Salted popcorn and pretzels. Canned or dried soups. Pizza. Frozen entrees and pot pies.  The items listed above may not be a complete list of foods and beverages to avoid. Contact your dietitian for more information.   This information is  not intended to replace advice given to you by your health care provider. Make sure you discuss any questions you have with your health care provider.   Document Released: 05/24/2002 Document Revised: 12/23/2014 Document Reviewed: 10/06/2013 Elsevier Interactive Patient Education [employer].

## 2015-12-08 ENCOUNTER — Encounter: Payer: Self-pay | Admitting: Family Medicine

## 2015-12-08 ENCOUNTER — Ambulatory Visit: Payer: Self-pay | Attending: Family Medicine | Admitting: Family Medicine

## 2015-12-08 VITALS — BP 138/85 | HR 62 | Temp 97.8°F | Resp 16 | Ht 62.0 in | Wt 207.0 lb

## 2015-12-08 DIAGNOSIS — I10 Essential (primary) hypertension: Secondary | ICD-10-CM | POA: Insufficient documentation

## 2015-12-08 DIAGNOSIS — E1159 Type 2 diabetes mellitus with other circulatory complications: Secondary | ICD-10-CM | POA: Insufficient documentation

## 2015-12-08 DIAGNOSIS — Z87891 Personal history of nicotine dependence: Secondary | ICD-10-CM | POA: Insufficient documentation

## 2015-12-08 DIAGNOSIS — K219 Gastro-esophageal reflux disease without esophagitis: Secondary | ICD-10-CM | POA: Insufficient documentation

## 2015-12-08 DIAGNOSIS — I25110 Atherosclerotic heart disease of native coronary artery with unstable angina pectoris: Secondary | ICD-10-CM | POA: Insufficient documentation

## 2015-12-08 DIAGNOSIS — Z955 Presence of coronary angioplasty implant and graft: Secondary | ICD-10-CM | POA: Insufficient documentation

## 2015-12-08 DIAGNOSIS — I252 Old myocardial infarction: Secondary | ICD-10-CM | POA: Insufficient documentation

## 2015-12-08 DIAGNOSIS — Z79899 Other long term (current) drug therapy: Secondary | ICD-10-CM | POA: Insufficient documentation

## 2015-12-08 DIAGNOSIS — E785 Hyperlipidemia, unspecified: Secondary | ICD-10-CM | POA: Insufficient documentation

## 2015-12-08 DIAGNOSIS — E1165 Type 2 diabetes mellitus with hyperglycemia: Secondary | ICD-10-CM | POA: Insufficient documentation

## 2015-12-08 DIAGNOSIS — Z7982 Long term (current) use of aspirin: Secondary | ICD-10-CM | POA: Insufficient documentation

## 2015-12-08 DIAGNOSIS — IMO0002 Reserved for concepts with insufficient information to code with codable children: Secondary | ICD-10-CM

## 2015-12-08 LAB — POCT GLYCOSYLATED HEMOGLOBIN (HGB A1C): Hemoglobin A1C: 8.4

## 2015-12-08 LAB — GLUCOSE, POCT (MANUAL RESULT ENTRY): POC Glucose: 170 mg/dl — AB (ref 70–99)

## 2015-12-08 NOTE — Patient Instructions (Signed)
Diabetes Mellitus and Food It is important for you to manage your blood sugar (glucose) level. Your blood glucose level can be greatly affected by what you eat. Eating healthier foods in the appropriate amounts throughout the day at about the same time each day will help you control your blood glucose level. It can also help slow or prevent worsening of your diabetes mellitus. Healthy eating may even help you improve the level of your blood pressure and reach or maintain a healthy weight.  General recommendations for healthful eating and cooking habits include:  Eating meals and snacks regularly. Avoid going long periods of time without eating to lose weight.  Eating a diet that consists mainly of plant-based foods, such as fruits, vegetables, nuts, legumes, and whole grains.  Using low-heat cooking methods, such as baking, instead of high-heat cooking methods, such as deep frying. Work with your dietitian to make sure you understand how to use the Nutrition Facts information on food labels. HOW CAN FOOD AFFECT ME? Carbohydrates Carbohydrates affect your blood glucose level more than any other type of food. Your dietitian will help you determine how many carbohydrates to eat at each meal and teach you how to count carbohydrates. Counting carbohydrates is important to keep your blood glucose at a healthy level, especially if you are using insulin or taking certain medicines for diabetes mellitus. Alcohol Alcohol can cause sudden decreases in blood glucose (hypoglycemia), especially if you use insulin or take certain medicines for diabetes mellitus. Hypoglycemia can be a life-threatening condition. Symptoms of hypoglycemia (sleepiness, dizziness, and disorientation) are similar to symptoms of having too much alcohol.  If your health care provider has given you approval to drink alcohol, do so in moderation and use the following guidelines:  Women should not have more than one drink per day, and men  should not have more than two drinks per day. One drink is equal to:  12 oz of beer.  5 oz of wine.  1 oz of hard liquor.  Do not drink on an empty stomach.  Keep yourself hydrated. Have water, diet soda, or unsweetened iced tea.  Regular soda, juice, and other mixers might contain a lot of carbohydrates and should be counted. WHAT FOODS ARE NOT RECOMMENDED? As you make food choices, it is important to remember that all foods are not the same. Some foods have fewer nutrients per serving than other foods, even though they might have the same number of calories or carbohydrates. It is difficult to get your body what it needs when you eat foods with fewer nutrients. Examples of foods that you should avoid that are high in calories and carbohydrates but low in nutrients include:  Trans fats (most processed foods list trans fats on the Nutrition Facts label).  Regular soda.  Juice.  Candy.  Sweets, such as cake, pie, doughnuts, and cookies.  Fried foods. WHAT FOODS CAN I EAT? Eat nutrient-rich foods, which will nourish your body and keep you healthy. The food you should eat also will depend on several factors, including:  The calories you need.  The medicines you take.  Your weight.  Your blood glucose level.  Your blood pressure level.  Your cholesterol level. You should eat a variety of foods, including:  Protein.  Lean cuts of meat.  Proteins low in saturated fats, such as fish, egg whites, and beans. Avoid processed meats.  Fruits and vegetables.  Fruits and vegetables that may help control blood glucose levels, such as apples, mangoes, and   yams.  Dairy products.  Choose fat-free or low-fat dairy products, such as milk, yogurt, and cheese.  Grains, bread, pasta, and rice.  Choose whole grain products, such as multigrain bread, whole oats, and brown rice. These foods may help control blood pressure.  Fats.  Foods containing healthful fats, such as nuts,  avocado, olive oil, canola oil, and fish. DOES EVERYONE WITH DIABETES MELLITUS HAVE THE SAME MEAL PLAN? Because every person with diabetes mellitus is different, there is not one meal plan that works for everyone. It is very important that you meet with a dietitian who will help you create a meal plan that is just right for you.   This information is not intended to replace advice given to you by your health care provider. Make sure you discuss any questions you have with your health care provider.   Document Released: 08/29/2005 Document Revised: 12/23/2014 Document Reviewed: 10/29/2013 Elsevier Interactive Patient Education 2016 [employer]  

## 2015-12-08 NOTE — Progress Notes (Signed)
Patient's here for f/up DM.   Patient reports no pain today.

## 2015-12-08 NOTE — Progress Notes (Signed)
Subjective:    Patient ID: [name], female    DOB: 16-Mar-1956, 59 y.o.   MRN: [id number]  HPI Quintana Peshlakai is a 59 year old female with a history of uncontrolled type 2 diabetes mellitus (A1c 11.2), dyslipidemia, coronary artery disease status post DES stent (in 08/2015, currently on Brilinta), essential hypertension who comes in today for a follow-up visit  Since her last office visit she has been to see her cardiologist and had an increase in her dose of lisinopril and Lasix added to her regimen due to elevated blood pressure.  Today she forgot her blood sugar log but states her fasting sugars have been in the 170s and she admits to not being compliant with her diabetic diet. Denies chest pains, shortness of breath or pedal edema.  Past Medical History  Diagnosis Date  . Diabetes mellitus without complication ([place])   . Heart attack ([place])   . GERD (gastroesophageal reflux disease)   . Hyperlipidemia LDL goal < 70 06/07/2013  . Coronary atherosclerosis of native coronary artery, with stent to LCX in 2006 06/04/2013  . S/P coronary artery stent placement to RCA with PROMUS DES, 06/07/13, residual disease on OM1, OM2 and rPDA 06/07/2013  . Anginal pain ([place])   . Hypertension   . Arthritis     Past Surgical History  Procedure Laterality Date  . Coronary stent placement  approx 2005    1 stent  . Left heart catheterization with coronary angiogram N/A 06/07/2013    Procedure: LEFT HEART CATHETERIZATION WITH CORONARY ANGIOGRAM;  Surgeon: Sanda Klein, MD;  Location: [facility];  Service: Cardiovascular;  Laterality: N/A;  . Cardiac catheterization N/A 08/22/2015    Procedure: Left Heart Cath and Coronary Angiography;  Surgeon: Leonie Man, MD;  Location: [facility];  Service: Cardiovascular;  Laterality: N/A;  . Cardiac catheterization N/A 08/22/2015    Procedure: Coronary Stent Intervention;  Surgeon: Leonie Man, MD;  Location: [facility];  Service:  Cardiovascular;  Laterality: N/A;  . Cardiac catheterization N/A 08/22/2015    Procedure: Left Heart Cath and Coronary Angiography;  Surgeon: Leonie Man, MD;  Location: [facility];  Service: Cardiovascular;  Laterality: N/A;    Social History   Social History  . Marital Status: Divorced    Spouse Name: N/A  . Number of Children: N/A  . Years of Education: N/A   Occupational History  . Not on file.   Social History Main Topics  . Smoking status: Former Smoker -- 25 years    Quit date: 07/03/2004  . Smokeless tobacco: Never Used  . Alcohol Use: Yes     Comment: ocassionally   . Drug Use: No  . Sexual Activity: Not on file   Other Topics Concern  . Not on file   Social History Narrative    No Known Allergies  Current Outpatient Prescriptions on File Prior to Visit  Medication Sig Dispense Refill  . acetaminophen (TYLENOL) 500 MG tablet Take 1,000 mg by mouth daily as needed for moderate pain.     Marland Kitchen aspirin EC 81 MG EC tablet Take 1 tablet (81 mg total) by mouth daily.    Marland Kitchen atorvastatin (LIPITOR) 80 MG tablet Take 1 tablet (80 mg total) by mouth daily at 6 PM. 30 tablet 11  . canagliflozin (INVOKANA) 100 MG TABS tablet Take 1 tablet (100 mg total) by mouth daily. 90 tablet 3  . furosemide (LASIX) 40 MG tablet Take 1 tablet (40  mg total) by mouth daily. 30 tablet 5  . glimepiride (AMARYL) 4 MG tablet Take 1 tablet (4 mg total) by mouth 2 (two) times daily with a meal. 60 tablet 2  . isosorbide mononitrate (IMDUR) 30 MG 24 hr tablet Take 1 tablet (30 mg total) by mouth daily. 30 tablet 5  . lisinopril (PRINIVIL,ZESTRIL) 40 MG tablet Take 1 tablet (40 mg total) by mouth daily. 30 tablet 6  . metoprolol tartrate (LOPRESSOR) 100 MG tablet Take 1 tablet (100 mg total) by mouth 2 (two) times daily. 60 tablet 11  . nitroGLYCERIN (NITROSTAT) 0.4 MG SL tablet Place 1 tablet (0.4 mg total) under the tongue every 5 (five) minutes as needed for chest pain. 25 tablet 2  .  ranitidine (ZANTAC) 150 MG capsule Take 150 mg by mouth daily as needed for heartburn.    . ticagrelor (BRILINTA) 90 MG TABS tablet Take 1 tablet (90 mg total) by mouth 2 (two) times daily. 180 tablet 3   No current facility-administered medications on file prior to visit.      Review of Systems  Constitutional: Negative for activity change and appetite change.  HENT: Negative for sinus pressure and sore throat.   Respiratory: Negative for chest tightness, shortness of breath and wheezing.   Gastrointestinal: Negative for abdominal pain, constipation and abdominal distention.  Genitourinary: Negative.   Musculoskeletal: Negative.   Psychiatric/Behavioral: Negative for behavioral problems and dysphoric mood.       Objective: Filed Vitals:   12/08/15 0908  BP: 138/85  Pulse: 62  Temp: 97.8 F (36.6 C)  TempSrc: Oral  Resp: 16  Height: 5\' 2"  (1.575 m)  Weight: 207 lb (93.895 kg)  SpO2: 99%      Physical Exam  Constitutional: She is oriented to person, place, and time. She appears well-developed and well-nourished.  Cardiovascular: Normal rate, normal heart sounds and intact distal pulses.   No murmur heard. Pulmonary/Chest: Effort normal and breath sounds normal. She has no wheezes. She has no rales. She exhibits no tenderness.  Abdominal: Soft. Bowel sounds are normal. She exhibits no distension and no mass. There is no tenderness.  Musculoskeletal: Normal range of motion.  Neurological: She is alert and oriented to person, place, and time.    CMP Latest Ref Rng 08/27/2015 08/26/2015 08/23/2015  Glucose 65 - 99 mg/dL 255(H) 316(H) 234(H)  BUN 6 - 20 mg/dL 16 22(H) 9  Creatinine 0.44 - 1.00 mg/dL 0.81 0.88 0.71  Sodium 135 - 145 mmol/L 139 132(L) 139  Potassium 3.5 - 5.1 mmol/L 4.1 4.4 3.7  Chloride 101 - 111 mmol/L 107 101 105  CO2 22 - 32 mmol/L 24 20(L) 23  Calcium 8.9 - 10.3 mg/dL 9.3 9.1 9.4  Total Protein 6.5 - 8.1 g/dL - - -  Total Bilirubin 0.3 - 1.2 mg/dL - - -    Alkaline Phos 38 - 126 U/L - - -  AST 15 - 41 U/L - - -  ALT 14 - 54 U/L - - -    Lab Results  Component Value Date   HGBA1C 8.40 12/08/2015       Assessment & Plan:  1. Type 2 diabetes mellitus, uncontrolled  A1c 8.4 which has trended down from 11.2 three months ago Continue Increased dose of Amaryl to 4 mg twice daily. She has refused initiation of insulin she was previously nauseated with metformin which was discontinued and is still not ready to resume metformin despite high fasting sugars in the 170 but  states that she will be leaving to go back on it next month if sugars are still high. Continue Invokana   2. Dyslipidemia Continue statin for Secondary prevention  3. Coronary artery disease involving native coronary artery of native heart with unstable angina pectoris Status post DES stent Continue Brilinta  4. Essential hypertension Controlled. Continue low-sodium, DASH diet as well as exercise  Labs at nextvisit  This note has been created with Surveyor, quantity. Any transcriptional errors are unintentional.

## 2015-12-08 NOTE — Progress Notes (Signed)
Patient ID: IDELL [name], female   DOB: Apr 30, 1956, 59 y.o.   MRN: [id number]    Cardiology Office Note    Date:  12/08/2015   ID:  [name], DOB 1956/09/07, MRN [id number]  PCP:  No PCP Per Patient  Cardiologist:   Sanda Klein, MD   Chief Complaint  Patient presents with  . Follow-up    has chest pain, has shortness of breath frequently, no edema, occassional pain or cramping in legs,no lightheaded or dizziness    History of Present Illness:  LACIANA [name] is a 59 y.o. female presenting roughly 3 months following non-ST segment elevation myocardial infarction and placement of drug-eluting stents to the LAD artery and right coronary artery (September 6). Prior to that she had a stent in the left circumflex coronary artery in 2006 and a stent to the right coronary artery in 2014. She has severe disease in the first and second obtuse marginals as well as in the posterior descending artery, vessels are felt to be too small for percutaneous revascularization. Compliance with medications due to financial issues has been a barrier to care in the past. She currently reports compliance with all her medicines.  She is working and generally feeling well but has noticed recurrent episodes of chest pressure and dyspnea. These happen when she climbs the stairs to her second floor apartment occasionally have also occurred in the early hours of the morning around 3 or 4 AM. Her chest discomfort is rather atypical and seems to improve if she drinks water, but many of her complaints sound consistent with paroxysmal nocturnal dyspnea and angina decubitus.She has not tried taking nitroglycerin. Her blood pressure today is quite high.  Past Medical History  Diagnosis Date  . Diabetes mellitus without complication ([place])   . Heart attack ([place])   . GERD (gastroesophageal reflux disease)   . Hyperlipidemia LDL goal < 70 06/07/2013  . Coronary atherosclerosis of native coronary artery, with stent to LCX in  2006 06/04/2013  . S/P coronary artery stent placement to RCA with PROMUS DES, 06/07/13, residual disease on OM1, OM2 and rPDA 06/07/2013  . Anginal pain ([place])   . Hypertension   . Arthritis     Past Surgical History  Procedure Laterality Date  . Coronary stent placement  approx 2005    1 stent  . Left heart catheterization with coronary angiogram N/A 06/07/2013    Procedure: LEFT HEART CATHETERIZATION WITH CORONARY ANGIOGRAM;  Surgeon: Sanda Klein, MD;  Location: [name];  Service: Cardiovascular;  Laterality: N/A;  . Cardiac catheterization N/A 08/22/2015    Procedure: Left Heart Cath and Coronary Angiography;  Surgeon: Leonie Man, MD;  Location: [facility];  Service: Cardiovascular;  Laterality: N/A;  . Cardiac catheterization N/A 08/22/2015    Procedure: Coronary Stent Intervention;  Surgeon: Leonie Man, MD;  Location: [facility];  Service: Cardiovascular;  Laterality: N/A;  . Cardiac catheterization N/A 08/22/2015    Procedure: Left Heart Cath and Coronary Angiography;  Surgeon: Leonie Man, MD;  Location: [facility];  Service: Cardiovascular;  Laterality: N/A;    Current Outpatient Prescriptions  Medication Sig Dispense Refill  . acetaminophen (TYLENOL) 500 MG tablet Take 1,000 mg by mouth daily as needed for moderate pain.     Marland Kitchen aspirin EC 81 MG EC tablet Take 1 tablet (81 mg total) by mouth daily.    Marland Kitchen atorvastatin (LIPITOR) 80 MG tablet Take 1 tablet (80 mg total) by  mouth daily at 6 PM. 30 tablet 11  . canagliflozin (INVOKANA) 100 MG TABS tablet Take 1 tablet (100 mg total) by mouth daily. 90 tablet 3  . glimepiride (AMARYL) 4 MG tablet Take 1 tablet (4 mg total) by mouth 2 (two) times daily with a meal. 60 tablet 2  . isosorbide mononitrate (IMDUR) 30 MG 24 hr tablet Take 1 tablet (30 mg total) by mouth daily. 30 tablet 5  . lisinopril (PRINIVIL,ZESTRIL) 40 MG tablet Take 1 tablet (40 mg total) by mouth daily. 30 tablet 6  . metoprolol  tartrate (LOPRESSOR) 100 MG tablet Take 1 tablet (100 mg total) by mouth 2 (two) times daily. 60 tablet 11  . nitroGLYCERIN (NITROSTAT) 0.4 MG SL tablet Place 1 tablet (0.4 mg total) under the tongue every 5 (five) minutes as needed for chest pain. 25 tablet 2  . ranitidine (ZANTAC) 150 MG capsule Take 150 mg by mouth daily as needed for heartburn.    . ticagrelor (BRILINTA) 90 MG TABS tablet Take 1 tablet (90 mg total) by mouth 2 (two) times daily. 180 tablet 3  . furosemide (LASIX) 40 MG tablet Take 1 tablet (40 mg total) by mouth daily. 30 tablet 5   No current facility-administered medications for this visit.    Allergies:   Review of patient's allergies indicates no known allergies.   Social History   Social History  . Marital Status: Divorced    Spouse Name: N/A  . Number of Children: N/A  . Years of Education: N/A   Social History Main Topics  . Smoking status: Former Smoker -- 25 years    Quit date: 07/03/2004  . Smokeless tobacco: Never Used  . Alcohol Use: Yes     Comment: ocassionally   . Drug Use: No  . Sexual Activity: Not Asked   Other Topics Concern  . None   Social History Narrative     Family History:  History reviewed. No pertinent family history.  ROS:   Please see the history of present illness.    Review of Systems  Musculoskeletal: Positive for muscle cramps.   All other systems reviewed and are negative.   PHYSICAL EXAM:   VS:  BP 162/90 mmHg  Pulse 71  Ht 5\' 2"  (1.575 m)  Wt 203 lb 1 oz (92.109 kg)  BMI 37.13 kg/m2   GEN: Well nourished, well developed, in no acute distress HEENT: normal Neck: no JVD, carotid bruits, or masses Cardiac: RRR; no murmurs, rubs, or gallops,no edema  Respiratory:  clear to auscultation bilaterally, normal work of breathing GI: soft, nontender, nondistended, + BS MS: no deformity or atrophy Skin: warm and dry, no rash Neuro:  Alert and Oriented x 3, Strength and sensation are intact Psych: euthymic mood,  full affect  Wt Readings from Last 3 Encounters:  12/08/15 207 lb (93.895 kg)  12/06/15 203 lb 1 oz (92.109 kg)  11/03/15 194 lb (87.998 kg)      Studies/Labs Reviewed:   EKG:  EKG is ordered today.  The ekg ordered today demonstrates sinus rhythm, minor nonspecific T-wave changes, QTc 456 ms  Recent Labs: 08/18/2015: ALT 21 08/27/2015: B Natriuretic Peptide 80.6; BUN 16; Creatinine, Ser 0.81; Hemoglobin 13.4; Platelets 285; Potassium 4.1; Sodium 139; TSH 0.997   Lipid Panel    Component Value Date/Time   CHOL 138 08/27/2015 0632   TRIG 108 08/27/2015 0632   HDL 41 08/27/2015 0632   CHOLHDL 3.4 08/27/2015 0632   VLDL 22 08/27/2015 [id number]  [place] 75 08/27/2015 [id number]    Additional studies/ records that were reviewed today include:  September coronary angiography  ASSESSMENT:    1. Acute on chronic diastolic heart failure ([place])   2. Coronary artery disease involving native coronary artery of native heart with other form of angina pectoris ([place])   3. Hypertensive heart disease with heart failure ([place])   4. Uncontrolled type 2 diabetes mellitus with other circulatory complication, without long-term current use of insulin ([facility])      PLAN:  In order of problems listed above:  1.  The constellation of probable paroxysmal nocturnal dyspnea, exertional dyspnea, elevated blood pressure and 13 pound weight gain since November suggest that her symptoms are due to acute diastolic heart failure. Will increase her ACE inhibitor and add a loop diuretic. Needs to be reevaluated in a few weeks. Had a lengthy discussion regarding low-sodium diet and weight monitoring, signs and symptoms of acute heart failure exacerbation  2. She is currently experiencing angina pectoris, but this may be related to increased left ventricular filling pressures. She has known coronary stenoses in several vessels that are felt to be not amenable to percutaneous revascularization.  3. Many years of insufficiently  treated hypertension likely are the major cause for her diastolic heart failure, with probable contribution of coronary ischemia.  4. Evaluate renal function after increasing ACE inhibitor dose and adding diuretics, with particular caution due to the presence of poorly controlled diabetes mellitus.  Medication Adjustments/Labs and Tests Ordered: Current medicines are reviewed at length with the patient today.  Concerns regarding medicines are outlined above.  Medication changes, Labs and Tests ordered today are listed below. Patient Instructions  Your physician has recommended you make the following change in your medication:   INCREASE LISINOPRIL TO 40 MG DAILY  (A NEW RX HAS BEEN SENT TO YOUR PHARMACY FOR THE NEW STRENGTH).  START FUROSEMIDE 40 MG DAILY  Your physician recommends that you schedule a follow-up appointment in: [place] [facility] [place].  Dr. Sallyanne Kuster recommends that you schedule a follow-up appointment in: 3 MONTHS  Low-Sodium Eating Plan Sodium raises blood pressure and causes water to be held in the body. Getting less sodium from food will help lower your blood pressure, reduce any swelling, and protect your heart, liver, and kidneys. We get sodium by adding salt (sodium chloride) to food. Most of our sodium comes from canned, boxed, and frozen foods. Restaurant foods, fast foods, and pizza are also very high in sodium. Even if you take medicine to lower your blood pressure or to reduce fluid in your body, getting less sodium from your food is important. WHAT IS MY PLAN? Most people should limit their sodium intake to 2,300 mg a day. Your health care provider recommends that you limit your sodium intake to ____2000______ a day.  WHAT DO I NEED TO KNOW ABOUT THIS EATING PLAN? For the low-sodium eating plan, you will follow these general guidelines:  Choose foods with a % Daily Value for sodium of less than 5% (as listed on the food label).   Use salt-free seasonings or herbs  instead of table salt or sea salt.   Check with your health care provider or pharmacist before using salt substitutes.   Eat fresh foods.  Eat more vegetables and fruits.  Limit canned vegetables. If you do use them, rinse them well to decrease the sodium.   Limit cheese to 1 oz (28 g) per day.   Eat lower-sodium products, often labeled as "  lower sodium" or "no salt added."  Avoid foods that contain monosodium glutamate (MSG). MSG is sometimes added to [place] food and some canned foods.  Check food labels (Nutrition Facts labels) on foods to learn how much sodium is in one serving.  Eat more home-cooked food and less restaurant, buffet, and fast food.  When eating at a restaurant, ask that your food be prepared with less salt, or no salt if possible.  HOW DO I READ FOOD LABELS FOR SODIUM INFORMATION? The Nutrition Facts label lists the amount of sodium in one serving of the food. If you eat more than one serving, you must multiply the listed amount of sodium by the number of servings. Food labels may also identify foods as:  Sodium free--Less than 5 mg in a serving.  Very low sodium--35 mg or less in a serving.  Low sodium--140 mg or less in a serving.  Light in sodium--50% less sodium in a serving. For example, if a food that usually has 300 mg of sodium is changed to become light in sodium, it will have 150 mg of sodium.  Reduced sodium--25% less sodium in a serving. For example, if a food that usually has 400 mg of sodium is changed to reduced sodium, it will have 300 mg of sodium. WHAT FOODS CAN I EAT? Grains Low-sodium cereals, including oats, puffed wheat and rice, and shredded wheat cereals. Low-sodium crackers. Unsalted rice and pasta. Lower-sodium bread.  Vegetables Frozen or fresh vegetables. Low-sodium or reduced-sodium canned vegetables. Low-sodium or reduced-sodium tomato sauce and paste. Low-sodium or reduced-sodium tomato and vegetable juices.    Fruits Fresh, frozen, and canned fruit. Fruit juice.  Meat and Other Protein Products Low-sodium canned tuna and salmon. Fresh or frozen meat, poultry, seafood, and fish. Lamb. Unsalted nuts. Dried beans, peas, and lentils without added salt. Unsalted canned beans. Homemade soups without salt. Eggs.  Dairy Milk. Soy milk. Ricotta cheese. Low-sodium or reduced-sodium cheeses. Yogurt.  Condiments Fresh and dried herbs and spices. Salt-free seasonings. Onion and garlic powders. Low-sodium varieties of mustard and ketchup. Fresh or refrigerated horseradish. Lemon juice.  Fats and Oils Reduced-sodium salad dressings. Unsalted butter.  Other Unsalted popcorn and pretzels.  The items listed above may not be a complete list of recommended foods or beverages. Contact your dietitian for more options. WHAT FOODS ARE NOT RECOMMENDED? Grains Instant hot cereals. Bread stuffing, pancake, and biscuit mixes. Croutons. Seasoned rice or pasta mixes. Noodle soup cups. Boxed or frozen macaroni and cheese. Self-rising flour. Regular salted crackers. Vegetables Regular canned vegetables. Regular canned tomato sauce and paste. Regular tomato and vegetable juices. Frozen vegetables in sauces. Salted [place] fries. Olives. Angie Fava. Relishes. Sauerkraut. Salsa. Meat and Other Protein Products Salted, canned, smoked, spiced, or pickled meats, seafood, or fish. Bacon, ham, sausage, hot dogs, corned beef, chipped beef, and packaged luncheon meats. Salt pork. Jerky. Pickled herring. Anchovies, regular canned tuna, and sardines. Salted nuts. Dairy Processed cheese and cheese spreads. Cheese curds. Blue cheese and cottage cheese. Buttermilk.  Condiments Onion and garlic salt, seasoned salt, table salt, and sea salt. Canned and packaged gravies. Worcestershire sauce. Tartar sauce. Barbecue sauce. Teriyaki sauce. Soy sauce, including reduced sodium. Steak sauce. Fish sauce. Oyster sauce. Cocktail sauce.  Horseradish that you find on the shelf. Regular ketchup and mustard. Meat flavorings and tenderizers. Bouillon cubes. Hot sauce. Tabasco sauce. Marinades. Taco seasonings. Relishes. Fats and Oils Regular salad dressings. Salted butter. Margarine. Ghee. Bacon fat.  Other Potato and tortilla chips. Corn chips and puffs.  Salted popcorn and pretzels. Canned or dried soups. Pizza. Frozen entrees and pot pies.  The items listed above may not be a complete list of foods and beverages to avoid. Contact your dietitian for more information.   This information is not intended to replace advice given to you by your health care provider. Make sure you discuss any questions you have with your health care provider.   Document Released: 05/24/2002 Document Revised: 12/23/2014 Document Reviewed: 10/06/2013 Elsevier Interactive Patient Education 2016 [place], [place]  12/08/2015 3:40 PM    [facility] [address], [place], [place]  [address] Phone: [phone]; Fax: [phone]

## 2015-12-15 ENCOUNTER — Other Ambulatory Visit: Payer: Self-pay | Admitting: Family Medicine

## 2015-12-15 MED ORDER — CANAGLIFLOZIN 100 MG PO TABS: 100.0000 mg | ORAL_TABLET | Freq: Every day | ORAL | Status: DC

## 2015-12-18 MED FILL — GLIMEPIRIDE 4 MG TABLET: 4 | 30 days supply | Qty: 60 | Fill #1

## 2015-12-20 MED FILL — INVOKANA 100 MG TABLET: 100 | 30 days supply | Qty: 30 | Fill #2

## 2016-01-01 MED FILL — ISOSORBIDE MN ER 30 MG TAB: 30 | 30 days supply | Qty: 30 | Fill #3

## 2016-01-01 MED FILL — ?FUROSEMIDE 40 MG TABLET: 40 | 30 days supply | Qty: 30 | Fill #1

## 2016-01-01 MED FILL — ?METOPROLOL 100 MG TABLET: 100 | 30 days supply | Qty: 60 | Fill #3

## 2016-01-01 MED FILL — ATORVASTATIN 80 MG TABLET: 80 | 30 days supply | Qty: 30 | Fill #3

## 2016-01-04 ENCOUNTER — Encounter: Payer: Self-pay | Admitting: Physician Assistant

## 2016-01-04 NOTE — Progress Notes (Signed)
    Cardiology Office Note   Date:  01/04/2016   ID:  [name], DOB [birth year], MRN [id number]  PCP:  No PCP Per Patient  Cardiologist:  Dr Hughie Closs, Suanne Marker, PA-C   No chief complaint on file.   History of Present Illness: [name] is a 60 y.o. female with a history of NSTEMI 08/2015 w/ DES LAD & RCA, stent CFX 2006, RCA stent 2014, med rx for OM1, OM2 & PDA, DM, HTN, HL, GERD, OA   This encounter was created in error - please disregard.

## 2016-01-17 MED FILL — GLIMEPIRIDE 4 MG TABLET: 4 | 30 days supply | Qty: 60 | Fill #2

## 2016-01-17 MED FILL — LISINOPRIL 40 MG TABLET: 40 | 30 days supply | Qty: 30 | Fill #1

## 2016-01-17 MED FILL — INVOKANA 100 MG TABLET: 100 | 30 days supply | Qty: 30 | Fill #3

## 2016-02-02 ENCOUNTER — Encounter: Payer: Self-pay | Admitting: Family Medicine

## 2016-02-02 ENCOUNTER — Ambulatory Visit: Payer: Self-pay | Attending: Family Medicine | Admitting: Family Medicine

## 2016-02-02 VITALS — BP 142/86 | HR 59 | Temp 97.9°F | Resp 15 | Ht 62.0 in | Wt 208.8 lb

## 2016-02-02 DIAGNOSIS — I2510 Atherosclerotic heart disease of native coronary artery without angina pectoris: Secondary | ICD-10-CM | POA: Insufficient documentation

## 2016-02-02 DIAGNOSIS — E785 Hyperlipidemia, unspecified: Secondary | ICD-10-CM | POA: Insufficient documentation

## 2016-02-02 DIAGNOSIS — I10 Essential (primary) hypertension: Secondary | ICD-10-CM | POA: Insufficient documentation

## 2016-02-02 DIAGNOSIS — Z79899 Other long term (current) drug therapy: Secondary | ICD-10-CM | POA: Insufficient documentation

## 2016-02-02 DIAGNOSIS — I5032 Chronic diastolic (congestive) heart failure: Secondary | ICD-10-CM | POA: Insufficient documentation

## 2016-02-02 DIAGNOSIS — Z9861 Coronary angioplasty status: Secondary | ICD-10-CM

## 2016-02-02 DIAGNOSIS — Z7982 Long term (current) use of aspirin: Secondary | ICD-10-CM | POA: Insufficient documentation

## 2016-02-02 DIAGNOSIS — Z6836 Body mass index (BMI) 36.0-36.9, adult: Secondary | ICD-10-CM | POA: Insufficient documentation

## 2016-02-02 DIAGNOSIS — E1165 Type 2 diabetes mellitus with hyperglycemia: Secondary | ICD-10-CM | POA: Insufficient documentation

## 2016-02-02 DIAGNOSIS — E669 Obesity, unspecified: Secondary | ICD-10-CM | POA: Insufficient documentation

## 2016-02-02 LAB — COMPLETE METABOLIC PANEL WITH GFR
ALT: 13 U/L (ref 6–29)
AST: 12 U/L (ref 10–35)
Albumin: 3.9 g/dL (ref 3.6–5.1)
Alkaline Phosphatase: 109 U/L (ref 33–130)
BUN: 20 mg/dL (ref 7–25)
CALCIUM: 9.2 mg/dL (ref 8.6–10.4)
CHLORIDE: 104 mmol/L (ref 98–110)
CO2: 28 mmol/L (ref 20–31)
Creat: 0.88 mg/dL (ref 0.50–1.05)
GFR, EST AFRICAN AMERICAN: 84 mL/min (ref >=60)
GFR, Est Non African American: 73 mL/min (ref >=60)
Glucose, Bld: 231 mg/dL — ABNORMAL HIGH (ref 65–99)
POTASSIUM: 4.1 mmol/L (ref 3.5–5.3)
Sodium: 140 mmol/L (ref 135–146)
Total Bilirubin: 0.4 mg/dL (ref 0.2–1.2)
Total Protein: 6.9 g/dL (ref 6.1–8.1)

## 2016-02-02 LAB — GLUCOSE, POCT (MANUAL RESULT ENTRY): POC Glucose: 221 mg/dl — AB (ref 70–99)

## 2016-02-02 MED ORDER — METFORMIN HCL 500 MG PO TABS: 500.0000 mg | ORAL_TABLET | Freq: Every day | ORAL | Status: DC

## 2016-02-02 MED ORDER — GLIMEPIRIDE 4 MG PO TABS: 4.0000 mg | ORAL_TABLET | Freq: Two times a day (BID) | ORAL | Status: DC

## 2016-02-02 MED ORDER — ISOSORBIDE MONONITRATE ER 30 MG PO TB24: 30.0000 mg | ORAL_TABLET | Freq: Every day | ORAL | Status: DC

## 2016-02-02 MED FILL — metFORMIN HCL 500 MG TABS: 500 | 30 days supply | Qty: 30 | Fill #0

## 2016-02-02 MED FILL — GLIMEPIRIDE 4 MG TABLET: 4 | 30 days supply | Qty: 60 | Fill #0

## 2016-02-02 MED FILL — ISOSORBIDE MN ER 30 MG TAB: 30 | 30 days supply | Qty: 30 | Fill #0

## 2016-02-02 NOTE — Progress Notes (Signed)
Patient has no complaints She is not sure if she needs any medication refills

## 2016-02-02 NOTE — Patient Instructions (Signed)
Diabetes and Exercise Exercising regularly is important. It is not just about losing weight. It has many health benefits, such as:  Improving your overall fitness, flexibility, and endurance.  Increasing your bone density.  Helping with weight control.  Decreasing your body fat.  Increasing your muscle strength.  Reducing stress and tension.  Improving your overall health. People with diabetes who exercise gain additional benefits because exercise:  Reduces appetite.  Improves the body's use of blood sugar (glucose).  Helps lower or control blood glucose.  Decreases blood pressure.  Helps control blood lipids (such as cholesterol and triglycerides).  Improves the body's use of the hormone insulin by:  Increasing the body's insulin sensitivity.  Reducing the body's insulin needs.  Decreases the risk for heart disease because exercising:  Lowers cholesterol and triglycerides levels.  Increases the levels of good cholesterol (such as high-density lipoproteins [HDL]) in the body.  Lowers blood glucose levels. YOUR ACTIVITY PLAN  Choose an activity that you enjoy, and set realistic goals. To exercise safely, you should begin practicing any new physical activity slowly, and gradually increase the intensity of the exercise over time. Your health care provider or diabetes educator can help create an activity plan that works for you. General recommendations include:  Encouraging children to engage in at least 60 minutes of physical activity each day.  Stretching and performing strength training exercises, such as yoga or weight lifting, at least 2 times per week.  Performing a total of at least 150 minutes of moderate-intensity exercise each week, such as brisk walking or water aerobics.  Exercising at least 3 days per week, making sure you allow no more than 2 consecutive days to pass without exercising.  Avoiding long periods of inactivity (90 minutes or more). When you  have to spend an extended period of time sitting down, take frequent breaks to walk or stretch. RECOMMENDATIONS FOR EXERCISING WITH TYPE 1 OR TYPE 2 DIABETES   Check your blood glucose before exercising. If blood glucose levels are greater than 240 mg/dL, check for urine ketones. Do not exercise if ketones are present.  Avoid injecting insulin into areas of the body that are going to be exercised. For example, avoid injecting insulin into:  The arms when playing tennis.  The legs when jogging.  Keep a record of:  Food intake before and after you exercise.  Expected peak times of insulin action.  Blood glucose levels before and after you exercise.  The type and amount of exercise you have done.  Review your records with your health care provider. Your health care provider will help you to develop guidelines for adjusting food intake and insulin amounts before and after exercising.  If you take insulin or oral hypoglycemic agents, watch for signs and symptoms of hypoglycemia. They include:  Dizziness.  Shaking.  Sweating.  Chills.  Confusion.  Drink plenty of water while you exercise to prevent dehydration or heat stroke. Body water is lost during exercise and must be replaced.  Talk to your health care provider before starting an exercise program to make sure it is safe for you. Remember, almost any type of activity is better than none.   This information is not intended to replace advice given to you by your health care provider. Make sure you discuss any questions you have with your health care provider.   Document Released: 02/22/2004 Document Revised: 04/18/2015 Document Reviewed: 05/11/2013 Elsevier Interactive Patient Education 2016 [employer] Inc[employer]  

## 2016-02-02 NOTE — Progress Notes (Signed)
Subjective:  Patient ID: [name], female    DOB: 1956/02/26  Age: 60 y.o. MRN: [id number]  CC: Diabetes   HPI Edda Dallaire is a 60 year old female with a history of uncontrolled type 2 diabetes mellitus (A1c 11.2), dyslipidemia, coronary artery disease status post DES stent (in 08/2015, currently on Brilinta), essential hypertension who comes in today for a follow-up visit  She brings in her blood sugar log which reveals fasting sugars between 90-221 and she admits to late night snacking. She has not been exercising either but endorses compliance with her antihypertensives and her statin.  She has not been to see Cardiology since 11/2015 but denies shortness of breath, chest pains or pedal edema. She has no complaints at this time.  Outpatient Prescriptions Prior to Visit  Medication Sig Dispense Refill  . acetaminophen (TYLENOL) 500 MG tablet Take 1,000 mg by mouth daily as needed for moderate pain.     Marland Kitchen aspirin EC 81 MG EC tablet Take 1 tablet (81 mg total) by mouth daily.    Marland Kitchen atorvastatin (LIPITOR) 80 MG tablet Take 1 tablet (80 mg total) by mouth daily at 6 PM. 30 tablet 11  . canagliflozin (INVOKANA) 100 MG TABS tablet Take 1 tablet (100 mg total) by mouth daily. 90 tablet 3  . furosemide (LASIX) 40 MG tablet Take 1 tablet (40 mg total) by mouth daily. 30 tablet 5  . lisinopril (PRINIVIL,ZESTRIL) 40 MG tablet Take 1 tablet (40 mg total) by mouth daily. 30 tablet 6  . metoprolol tartrate (LOPRESSOR) 100 MG tablet Take 1 tablet (100 mg total) by mouth 2 (two) times daily. 60 tablet 11  . nitroGLYCERIN (NITROSTAT) 0.4 MG SL tablet Place 1 tablet (0.4 mg total) under the tongue every 5 (five) minutes as needed for chest pain. 25 tablet 2  . ranitidine (ZANTAC) 150 MG capsule Take 150 mg by mouth daily as needed for heartburn.    . ticagrelor (BRILINTA) 90 MG TABS tablet Take 1 tablet (90 mg total) by mouth 2 (two) times daily. 180 tablet 3  . glimepiride (AMARYL) 4  MG tablet Take 1 tablet (4 mg total) by mouth 2 (two) times daily with a meal. 60 tablet 2  . isosorbide mononitrate (IMDUR) 30 MG 24 hr tablet Take 1 tablet (30 mg total) by mouth daily. 30 tablet 5   No facility-administered medications prior to visit.    ROS Review of Systems  Constitutional: Negative for activity change, appetite change and fatigue.  HENT: Negative for congestion, sinus pressure and sore throat.   Eyes: Negative for visual disturbance.  Respiratory: Negative for cough, chest tightness, shortness of breath and wheezing.   Cardiovascular: Negative for chest pain and palpitations.  Gastrointestinal: Negative for abdominal pain, constipation and abdominal distention.  Endocrine: Negative for polydipsia.  Genitourinary: Negative for dysuria and frequency.  Musculoskeletal: Negative for back pain and arthralgias.  Skin: Negative for rash.  Neurological: Negative for tremors, light-headedness and numbness.  Hematological: Does not bruise/bleed easily.  Psychiatric/Behavioral: Negative for behavioral problems and agitation.    Objective:  BP 142/86 mmHg  Pulse 59  Temp(Src) 97.9 F (36.6 C)  Resp 15  Ht 5\' 2"  (1.575 m)  Wt 208 lb 12.8 oz (94.711 kg)  BMI 38.18 kg/m2  SpO2 97%  BP/Weight 02/02/2016 12/08/2015 [id number]  Systolic BP [id number] 0000000  Diastolic BP 86 85 90  Wt. (Lbs) 208.8 207 203.06  BMI 38.18 37.85 37.13      Physical  Exam  Constitutional: She is oriented to person, place, and time. She appears well-developed and well-nourished.  Cardiovascular: Normal heart sounds and intact distal pulses.  Bradycardia present.   No murmur heard. Pulmonary/Chest: Effort normal and breath sounds normal. She has no wheezes. She has no rales. She exhibits no tenderness.  Abdominal: Soft. Bowel sounds are normal. She exhibits no distension and no mass. There is no tenderness.  Musculoskeletal: Normal range of motion.  Neurological: She is alert and oriented to  person, place, and time.   Lipid Panel     Component Value Date/Time   CHOL 138 08/27/2015 0632   TRIG 108 08/27/2015 0632   HDL 41 08/27/2015 0632   CHOLHDL 3.4 08/27/2015 0632   VLDL 22 08/27/2015 0632   LDLCALC 75 08/27/2015 0632      Assessment & Plan:   1. Type 2 diabetes mellitus with hyperglycemia, without long-term current use of insulin (HCC) A1c 8.40 Will add metformin to regimen Patient is resistant to initiation of insulin ADA diet, lifestyle modification - Glucose (CBG) - glimepiride (AMARYL) 4 MG tablet; Take 1 tablet (4 mg total) by mouth 2 (two) times daily with a meal.  Dispense: 60 tablet; Refill: 2 - metFORMIN (GLUCOPHAGE) 500 MG tablet; Take 1 tablet (500 mg total) by mouth daily with breakfast.  Dispense: 60 tablet; Refill: 3  2. Essential hypertension Controlled - COMPLETE METABOLIC PANEL WITH GFR  3. CAD -S/P RCA DES 08/22/15 Stable- continue dual antiplatelet therapy with Brilinta and ASA Followed by [facility] care  4. Dyslipidemia Controlled on statin  5. Chronic diastolic heart failure (HCC) No evidence of acute failure - isosorbide mononitrate (IMDUR) 30 MG 24 hr tablet; Take 1 tablet (30 mg total) by mouth daily.  Dispense: 30 tablet; Refill: 2  6. Obesity- BMI 36 Advised on exercising 30 mins, 5x/week, reducing portion sizes.   Meds ordered this encounter  Medications  . glimepiride (AMARYL) 4 MG tablet    Sig: Take 1 tablet (4 mg total) by mouth 2 (two) times daily with a meal.    Dispense:  60 tablet    Refill:  2    Discontinue daily dosing.  . isosorbide mononitrate (IMDUR) 30 MG 24 hr tablet    Sig: Take 1 tablet (30 mg total) by mouth daily.    Dispense:  30 tablet    Refill:  2  . metFORMIN (GLUCOPHAGE) 500 MG tablet    Sig: Take 1 tablet (500 mg total) by mouth daily with breakfast.    Dispense:  60 tablet    Refill:  3    Follow-up: Return in about 6 weeks (around 03/15/2016) for Follow-up on diabetes mellitus.    Arnoldo Morale MD

## 2016-02-05 MED FILL — ATORVASTATIN 80 MG TABLET: 80 | 30 days supply | Qty: 30 | Fill #4

## 2016-02-05 MED FILL — ?FUROSEMIDE 40 MG TABLET: 40 | 30 days supply | Qty: 30 | Fill #2

## 2016-02-05 MED FILL — ?METOPROLOL 100 MG TABLET: 100 | 30 days supply | Qty: 60 | Fill #4

## 2016-02-27 MED FILL — LISINOPRIL 40 MG TABLET: 40 | 30 days supply | Qty: 30 | Fill #2

## 2016-02-29 MED FILL — INVOKANA 100 MG TABLET: 100 | 30 days supply | Qty: 30 | Fill #4

## 2016-03-05 MED FILL — ?METOPROLOL 100 MG TABLET: 100 | 30 days supply | Qty: 60 | Fill #5

## 2016-03-05 MED FILL — ATORVASTATIN 80 MG TABLET: 80 | 30 days supply | Qty: 30 | Fill #5

## 2016-03-05 MED FILL — metFORMIN HCL 500 MG TABS: 500 | 30 days supply | Qty: 30 | Fill #1

## 2016-03-05 MED FILL — FUROSEMIDE 40 MG TABLET: 40 | 30 days supply | Qty: 30 | Fill #3

## 2016-03-05 MED FILL — ISOSORBIDE MN ER 30 MG TAB: 30 | 30 days supply | Qty: 30 | Fill #1

## 2016-03-06 ENCOUNTER — Encounter: Payer: Self-pay | Admitting: Cardiovascular Disease

## 2016-03-06 ENCOUNTER — Ambulatory Visit (INDEPENDENT_AMBULATORY_CARE_PROVIDER_SITE_OTHER): Payer: Self-pay | Admitting: Cardiovascular Disease

## 2016-03-06 VITALS — BP 132/90 | HR 64 | Ht 63.0 in | Wt 209.6 lb

## 2016-03-06 DIAGNOSIS — I10 Essential (primary) hypertension: Secondary | ICD-10-CM

## 2016-03-06 DIAGNOSIS — E1165 Type 2 diabetes mellitus with hyperglycemia: Secondary | ICD-10-CM

## 2016-03-06 DIAGNOSIS — IMO0002 Reserved for concepts with insufficient information to code with codable children: Secondary | ICD-10-CM

## 2016-03-06 DIAGNOSIS — I110 Hypertensive heart disease with heart failure: Secondary | ICD-10-CM

## 2016-03-06 DIAGNOSIS — E1159 Type 2 diabetes mellitus with other circulatory complications: Secondary | ICD-10-CM

## 2016-03-06 DIAGNOSIS — I2510 Atherosclerotic heart disease of native coronary artery without angina pectoris: Secondary | ICD-10-CM

## 2016-03-06 DIAGNOSIS — E785 Hyperlipidemia, unspecified: Secondary | ICD-10-CM

## 2016-03-06 DIAGNOSIS — E669 Obesity, unspecified: Secondary | ICD-10-CM

## 2016-03-06 DIAGNOSIS — I5032 Chronic diastolic (congestive) heart failure: Secondary | ICD-10-CM

## 2016-03-06 DIAGNOSIS — Z9861 Coronary angioplasty status: Secondary | ICD-10-CM

## 2016-03-06 NOTE — Progress Notes (Signed)
Patient ID: [name], female   DOB: 1956/09/24, 60 y.o.   MRN: [id number]    Cardiology Office Note    Date:  03/06/2016   ID:  [name], DOB 1956-04-11, MRN [id number]  PCP:  Arnoldo Morale, MD  Cardiologist:   Sanda Klein, MD   Chief Complaint  Patient presents with  . Follow-up  . Coronary Artery Disease    History of Present Illness:  [name] is a 60 y.o. female with chronic diastolic heart failure due to a combination of hypertensive and ischemic heart disease, returning for follow-up. Her last revascularization procedure was placement of a drug-eluting stent in the right coronary artery roughly 6 months ago. Since her last appointment she has felt better. She continues to become short of breath climbing a flight of stairs, that overall seems to have NYHA functional class II. She has only minimal ankle swelling. She denies angina, palpitations, syncope, focal neurological deficits, bleeding problems, orthopnea or PND, fever/chills, calf tenderness or swelling, claudication.    Past Medical History  Diagnosis Date  . Diabetes mellitus without complication ([place])   . Heart attack ([place])   . GERD (gastroesophageal reflux disease)   . Hyperlipidemia LDL goal < 70 06/07/2013  . Coronary atherosclerosis of native coronary artery, with stent to LCX in 2006 06/04/2013  . S/P coronary artery stent placement to RCA with PROMUS DES, 06/07/13, residual disease on OM1, OM2 and rPDA 06/07/2013  . Anginal pain ([place])   . Hypertension   . Arthritis     Past Surgical History  Procedure Laterality Date  . Coronary stent placement  2006    TAXUS stent [facility]X in [facility]  . Left heart catheterization with coronary angiogram N/A 06/07/2013    Procedure: LEFT HEART CATHETERIZATION WITH CORONARY ANGIOGRAM;  Surgeon: Sanda Klein, MD;  Location: [place] CATH LAB;  Service: Cardiovascular;  Laterality: N/A;  . Cardiac catheterization N/A 08/22/2015    Procedure: Left Heart Cath and Coronary  Angiography;  Surgeon: Leonie Man, MD;  Location: [place] CV LAB;  Service: Cardiovascular;  Laterality: N/A;  . Cardiac catheterization N/A 08/22/2015    Procedure: Coronary Stent Intervention;  Surgeon: Leonie Man, MD;  Location: [place] CV LAB;  Service: Cardiovascular;  Laterality: N/A;  . Cardiac catheterization N/A 08/22/2015    Procedure: Left Heart Cath and Coronary Angiography;  Surgeon: Leonie Man, MD;  Location: [place] CV LAB;  Service: Cardiovascular;  Laterality: N/A;    Outpatient Prescriptions Prior to Visit  Medication Sig Dispense Refill  . acetaminophen (TYLENOL) 500 MG tablet Take 1,000 mg by mouth daily as needed for moderate pain.     Marland Kitchen aspirin EC 81 MG EC tablet Take 1 tablet (81 mg total) by mouth daily.    Marland Kitchen atorvastatin (LIPITOR) 80 MG tablet Take 1 tablet (80 mg total) by mouth daily at 6 PM. 30 tablet 11  . canagliflozin (INVOKANA) 100 MG TABS tablet Take 1 tablet (100 mg total) by mouth daily. 90 tablet 3  . furosemide (LASIX) 40 MG tablet Take 1 tablet (40 mg total) by mouth daily. 30 tablet 5  . glimepiride (AMARYL) 4 MG tablet Take 1 tablet (4 mg total) by mouth 2 (two) times daily with a meal. 60 tablet 2  . isosorbide mononitrate (IMDUR) 30 MG 24 hr tablet Take 1 tablet (30 mg total) by mouth daily. 30 tablet 2  . lisinopril (PRINIVIL,ZESTRIL) 40 MG tablet Take 1 tablet (40 mg total) by mouth  daily. 30 tablet 6  . metFORMIN (GLUCOPHAGE) 500 MG tablet Take 1 tablet (500 mg total) by mouth daily with breakfast. 60 tablet 3  . metoprolol tartrate (LOPRESSOR) 100 MG tablet Take 1 tablet (100 mg total) by mouth 2 (two) times daily. 60 tablet 11  . nitroGLYCERIN (NITROSTAT) 0.4 MG SL tablet Place 1 tablet (0.4 mg total) under the tongue every 5 (five) minutes as needed for chest pain. 25 tablet 2  . ranitidine (ZANTAC) 150 MG capsule Take 150 mg by mouth daily as needed for heartburn.    . ticagrelor (BRILINTA) 90 MG TABS tablet Take 1 tablet  (90 mg total) by mouth 2 (two) times daily. 180 tablet 3   No facility-administered medications prior to visit.     Allergies:   Review of patient's allergies indicates no known allergies.   Social History   Social History  . Marital Status: Divorced    Spouse Name: N/A  . Number of Children: N/A  . Years of Education: N/A   Social History Main Topics  . Smoking status: Former Smoker -- 25 years    Quit date: 07/03/2004  . Smokeless tobacco: Never Used  . Alcohol Use: Yes     Comment: ocassionally   . Drug Use: No  . Sexual Activity: Not Asked   Other Topics Concern  . None   Social History Narrative    ROS:   Please see the history of present illness.    ROS All other systems reviewed and are negative.   PHYSICAL EXAM:   VS:  BP 132/90 mmHg  Pulse 64  Ht 5\' 3"  (1.6 m)  Wt 95.074 kg (209 lb 9.6 oz)  BMI 37.14 kg/m2   GEN: Well nourished, well developed, in no acute distress HEENT: normal Neck: no JVD, carotid bruits, or masses Cardiac: RRR, S4 gallop; no murmurs, no rubs, no edema  Respiratory:  clear to auscultation bilaterally, normal work of breathing GI: soft, nontender, nondistended, + BS MS: no deformity or atrophy Skin: warm and dry, no rash Neuro:  Alert and Oriented x 3, Strength and sensation are intact Psych: euthymic mood, full affect  Wt Readings from Last 3 Encounters:  03/06/16 95.074 kg (209 lb 9.6 oz)  02/02/16 94.711 kg (208 lb 12.8 oz)  12/08/15 93.895 kg (207 lb)      Studies/Labs Reviewed:   EKG:  EKG is not ordered today.    Recent Labs: 08/27/2015: B Natriuretic Peptide 80.6; Hemoglobin 13.4; Platelets 285; TSH 0.997 02/02/2016: ALT 13; BUN 20; Creat 0.88; Potassium 4.1; Sodium 140   Lipid Panel    Component Value Date/Time   CHOL 138 08/27/2015 0632   TRIG 108 08/27/2015 0632   HDL 41 08/27/2015 0632   CHOLHDL 3.4 08/27/2015 0632   VLDL 22 08/27/2015 0632   LDLCALC 75 08/27/2015 [id number]     ASSESSMENT:    1. Chronic  diastolic heart failure ([place])   2. CAD -S/P RCA DES 08/22/15   3. Hypertensive heart disease with heart failure ([place])   4. Essential hypertension   5. Dyslipidemia   6. Uncontrolled type 2 diabetes mellitus with other circulatory complication, without long-term current use of insulin (HCC)   7. Obesity- BMI 36      PLAN:  In order of problems listed above:  1. CHF: Compensated, NYHA functional class II, appears clinically euvolemic. She has gained 6 pounds since her last appointment but there is really not a lot of evidence for hypervolemia on exam. She  has not been monitoring her daily weights and I have asked her to start doing so regularly. Reinforced the need for sodium restriction. She is on his inhibitors and beta blockers. It is possible that her volume status is better compensated after starting treatment with Invokana. 2. CAD: No angina. Only on a low dose of isosorbide mononitrate and beta blockers. Has numerous coronary lesions that are not amenable to revascularization. Brilinta through September 2017 3. HTNive CMP: mild LVH by echo, but this is probably the most important contributor to her diastolic heart failure 4. HTN: Control is fair, although her diastolic blood pressure is borderline today. 5. HLP: Mostly at target. Suspect her lipid profile will be even better when next checked it since glycemic control has improved 6. DM: Control has improved although it is still not perfect 7. We discussed the distinction between real weights and fluid again and how to interpret her daily weights. She is to call if she gains more than 3 pounds in 19 or 5 pounds; in the long run, substantial true weight loss would be highly beneficial    Medication Adjustments/Labs and Tests Ordered: Current medicines are reviewed at length with the patient today.  Concerns regarding medicines are outlined above.  Medication changes, Labs and Tests ordered today are listed in the Patient Instructions  below. Patient Instructions  Your physician recommends that you weigh, daily, at the same time every day, and in the same amount of clothing. Please record your daily weights on the handout provided and bring it to your next appointment.  Dr Sallyanne Kuster recommends that you schedule a follow-up appointment in 6 months. You will receive a reminder letter in the mail two months in advance. If you don't receive a letter, please call our office to schedule the follow-up appointment.  If you need a refill on your cardiac medications before your next appointment, please call your pharmacy.      Mikael Spray, MD  03/06/2016 10:00 AM    [facility] [place], [place], [place]  [address] Phone: [phone]; Fax: [phone]

## 2016-03-06 NOTE — Patient Instructions (Signed)
Your physician recommends that you weigh, daily, at the same time every day, and in the same amount of clothing. Please record your daily weights on the handout provided and bring it to your next appointment.  Dr Sallyanne Kuster recommends that you schedule a follow-up appointment in 6 months. You will receive a reminder letter in the mail two months in advance. If you don't receive a letter, please call our office to schedule the follow-up appointment.  If you need a refill on your cardiac medications before your next appointment, please call your pharmacy.

## 2016-03-27 MED FILL — INVOKANA 100 MG TABLET: 100 | 30 days supply | Qty: 30 | Fill #5

## 2016-03-27 MED FILL — LISINOPRIL 40 MG TABLET: 40 | 30 days supply | Qty: 30 | Fill #3

## 2016-03-27 MED FILL — GLIMEPIRIDE 4 MG TABLET: 4 | 30 days supply | Qty: 60 | Fill #1

## 2016-04-11 MED FILL — ?METOPROLOL 100 MG TABLET: 100 | 30 days supply | Qty: 60 | Fill #6

## 2016-04-11 MED FILL — ISOSORBIDE MN ER 30 MG TAB: 30 | 30 days supply | Qty: 30 | Fill #2

## 2016-04-11 MED FILL — metFORMIN HCL 500 MG TABS: 500 | 30 days supply | Qty: 30 | Fill #2

## 2016-04-11 MED FILL — FUROSEMIDE 40 MG TABLET: 40 | 30 days supply | Qty: 30 | Fill #4

## 2016-04-11 MED FILL — ATORVASTATIN 80 MG TABLET: 80 | 30 days supply | Qty: 30 | Fill #6

## 2016-04-30 MED FILL — LISINOPRIL 40 MG TABLET: 40 | 30 days supply | Qty: 30 | Fill #4

## 2016-04-30 MED FILL — GLIMEPIRIDE 4 MG TABLET: 4 | 30 days supply | Qty: 60 | Fill #2

## 2016-04-30 MED FILL — INVOKANA 100 MG TABLET: 100 | 30 days supply | Qty: 30 | Fill #6

## 2016-05-01 ENCOUNTER — Encounter: Payer: Self-pay | Admitting: Family Medicine

## 2016-05-01 ENCOUNTER — Ambulatory Visit: Payer: Self-pay | Attending: Family Medicine | Admitting: Family Medicine

## 2016-05-01 VITALS — BP 131/86 | HR 60 | Temp 98.4°F | Resp 18 | Ht 62.0 in | Wt 206.0 lb

## 2016-05-01 DIAGNOSIS — M19042 Primary osteoarthritis, left hand: Secondary | ICD-10-CM

## 2016-05-01 DIAGNOSIS — Z9861 Coronary angioplasty status: Secondary | ICD-10-CM

## 2016-05-01 DIAGNOSIS — Z7982 Long term (current) use of aspirin: Secondary | ICD-10-CM | POA: Insufficient documentation

## 2016-05-01 DIAGNOSIS — E669 Obesity, unspecified: Secondary | ICD-10-CM | POA: Insufficient documentation

## 2016-05-01 DIAGNOSIS — I5032 Chronic diastolic (congestive) heart failure: Secondary | ICD-10-CM | POA: Insufficient documentation

## 2016-05-01 DIAGNOSIS — IMO0002 Reserved for concepts with insufficient information to code with codable children: Secondary | ICD-10-CM

## 2016-05-01 DIAGNOSIS — M19041 Primary osteoarthritis, right hand: Secondary | ICD-10-CM | POA: Insufficient documentation

## 2016-05-01 DIAGNOSIS — K219 Gastro-esophageal reflux disease without esophagitis: Secondary | ICD-10-CM | POA: Insufficient documentation

## 2016-05-01 DIAGNOSIS — I2510 Atherosclerotic heart disease of native coronary artery without angina pectoris: Secondary | ICD-10-CM | POA: Insufficient documentation

## 2016-05-01 DIAGNOSIS — M19012 Primary osteoarthritis, left shoulder: Secondary | ICD-10-CM | POA: Insufficient documentation

## 2016-05-01 DIAGNOSIS — E1165 Type 2 diabetes mellitus with hyperglycemia: Secondary | ICD-10-CM | POA: Insufficient documentation

## 2016-05-01 DIAGNOSIS — Z7984 Long term (current) use of oral hypoglycemic drugs: Secondary | ICD-10-CM | POA: Insufficient documentation

## 2016-05-01 DIAGNOSIS — I252 Old myocardial infarction: Secondary | ICD-10-CM | POA: Insufficient documentation

## 2016-05-01 DIAGNOSIS — Z6836 Body mass index (BMI) 36.0-36.9, adult: Secondary | ICD-10-CM | POA: Insufficient documentation

## 2016-05-01 DIAGNOSIS — Z79899 Other long term (current) drug therapy: Secondary | ICD-10-CM | POA: Insufficient documentation

## 2016-05-01 DIAGNOSIS — E785 Hyperlipidemia, unspecified: Secondary | ICD-10-CM | POA: Insufficient documentation

## 2016-05-01 DIAGNOSIS — E1159 Type 2 diabetes mellitus with other circulatory complications: Secondary | ICD-10-CM

## 2016-05-01 DIAGNOSIS — I10 Essential (primary) hypertension: Secondary | ICD-10-CM | POA: Insufficient documentation

## 2016-05-01 LAB — GLUCOSE, POCT (MANUAL RESULT ENTRY): POC GLUCOSE: 159 mg/dL — AB (ref 70–99)

## 2016-05-01 NOTE — Progress Notes (Signed)
Patient is here for FU DM  Patient states she slid down 5 stairs two weeks ago and has some aching in her legs.  Patient has taken medication today and patient has eaten today.

## 2016-05-01 NOTE — Progress Notes (Signed)
Subjective:    Patient ID: [name], female    DOB: 1956/12/09, 60 y.o.   MRN: [id number]  HPI [name] is a 60 year old female with a history of uncontrolled type 2 diabetes mellitus (A1c 11.2), dyslipidemia, coronary artery disease status post DES stent (in 08/2015, currently on Brilinta), essential hypertension who comes in today for a follow-up visit.  She endorses compliance with her medications and blood sugar log reveals random sugars less than 200. She is working on a diabetic diet but not exercising much and is yet to have an annual eye exam.  Last visit to Cardiology was 2 months ago; she experiences some shortness of breath after she has gone up a flight of stairs but denies orthopnea, paroxysmal nocturnal dyspnea or pedal edema and she has not gained weight recently. Also complains of pains in her knuckles and left shoulder more in the mornings.   Past Medical History  Diagnosis Date  . Diabetes mellitus without complication ([place])   . Heart attack ([place])   . GERD (gastroesophageal reflux disease)   . Hyperlipidemia LDL goal < 70 06/07/2013  . Coronary atherosclerosis of native coronary artery, with stent to LCX in 2006 06/04/2013  . S/P coronary artery stent placement to RCA with PROMUS DES, 06/07/13, residual disease on OM1, OM2 and rPDA 06/07/2013  . Anginal pain ([place])   . Hypertension   . Arthritis     Past Surgical History  Procedure Laterality Date  . Coronary stent placement  2006    TA[facility]US stent CFX in [facility]  . Left heart catheterization with coronary angiogram N/A 06/07/2013    Procedure: LEFT HEART CATHETERIZATION WITH CORONARY ANGIOGRAM;  Surgeon: Sanda Klein, MD;  Location: [place] CATH LAB;  Service: Cardiovascular;  Laterality: N/A;  . Cardiac catheterization N/A 08/22/2015    Procedure: Left Heart Cath and Coronary Angiography;  Surgeon: Leonie Man, MD;  Location: [place] CV LAB;  Service: Cardiovascular;  Laterality: N/A;  . Cardiac  catheterization N/A 08/22/2015    Procedure: Coronary Stent Intervention;  Surgeon: Leonie Man, MD;  Location: [place] CV LAB;  Service: Cardiovascular;  Laterality: N/A;  . Cardiac catheterization N/A 08/22/2015    Procedure: Left Heart Cath and Coronary Angiography;  Surgeon: Leonie Man, MD;  Location: [place] CV LAB;  Service: Cardiovascular;  Laterality: N/A;    No Known Allergies  Current Outpatient Prescriptions on File Prior to Visit  Medication Sig Dispense Refill  . acetaminophen (TYLENOL) 500 MG tablet Take 1,000 mg by mouth daily as needed for moderate pain.     Marland Kitchen aspirin EC 81 MG EC tablet Take 1 tablet (81 mg total) by mouth daily.    Marland Kitchen atorvastatin (LIPITOR) 80 MG tablet Take 1 tablet (80 mg total) by mouth daily at 6 PM. 30 tablet 11  . canagliflozin (INVOKANA) 100 MG TABS tablet Take 1 tablet (100 mg total) by mouth daily. 90 tablet 3  . glimepiride (AMARYL) 4 MG tablet Take 1 tablet (4 mg total) by mouth 2 (two) times daily with a meal. 60 tablet 2  . isosorbide mononitrate (IMDUR) 30 MG 24 hr tablet Take 1 tablet (30 mg total) by mouth daily. 30 tablet 2  . lisinopril (PRINIVIL,ZESTRIL) 40 MG tablet Take 1 tablet (40 mg total) by mouth daily. 30 tablet 6  . metFORMIN (GLUCOPHAGE) 500 MG tablet Take 1 tablet (500 mg total) by mouth daily with breakfast. 60 tablet 3  . metoprolol tartrate (LOPRESSOR) 100 MG  tablet Take 1 tablet (100 mg total) by mouth 2 (two) times daily. 60 tablet 11  . nitroGLYCERIN (NITROSTAT) 0.4 MG SL tablet Place 1 tablet (0.4 mg total) under the tongue every 5 (five) minutes as needed for chest pain. 25 tablet 2  . ranitidine (ZANTAC) 150 MG capsule Take 150 mg by mouth daily as needed for heartburn.    . ticagrelor (BRILINTA) 90 MG TABS tablet Take 1 tablet (90 mg total) by mouth 2 (two) times daily. 180 tablet 3   No current facility-administered medications on file prior to visit.      Review of Systems  Constitutional: Negative  for activity change, appetite change and fatigue.  HENT: Negative for congestion, sinus pressure and sore throat.   Eyes: Negative for visual disturbance.  Respiratory: Positive for shortness of breath. Negative for cough, chest tightness and wheezing.   Cardiovascular: Negative for chest pain and palpitations.  Gastrointestinal: Negative for abdominal pain, constipation and abdominal distention.  Endocrine: Negative for polydipsia.  Genitourinary: Negative for dysuria and frequency.  Musculoskeletal: Positive for arthralgias.       See hpi  Skin: Negative for rash.  Neurological: Negative for tremors, light-headedness and numbness.  Hematological: Does not bruise/bleed easily.  Psychiatric/Behavioral: Negative for behavioral problems and agitation.       Objective: Filed Vitals:   05/01/16 1442  BP: 131/86  Pulse: 60  Temp: 98.4 F (36.9 C)  TempSrc: Oral  Resp: 18  Height: 5\' 2"  (1.575 m)  Weight: 206 lb (93.441 kg)  SpO2: 98%      Physical Exam  Constitutional: She is oriented to person, place, and time. She appears well-developed and well-nourished.  Obese  Cardiovascular: Normal rate, normal heart sounds and intact distal pulses.   No murmur heard. Pulmonary/Chest: Effort normal and breath sounds normal. She has no wheezes. She has no rales. She exhibits no tenderness.  Abdominal: Soft. Bowel sounds are normal. She exhibits no distension and no mass. There is no tenderness.  Musculoskeletal: Normal range of motion. She exhibits edema (mild edema of distal interphalangeal joints).  Mild tenderness on ROM of left shoulder  Neurological: She is alert and oriented to person, place, and time.          Assessment & Plan:  1. Type 2 diabetes mellitus with hyperglycemia, without long-term current use of insulin (HCC) A1c 8.40 Continue Invokana, glimepiride, metformin Patient is resistant to initiation of insulin ADA diet, lifestyle modification A1c ordered. Foot exam  performed today. Advised to schedule annual eye exam with optometrist or ophthalmologist-educated about community resources for eye exam as she has no medical coverage.  2. Essential hypertension Controlled - COMPLETE METABOLIC PANEL WITH GFR  3. CAD -S/P RCA DES 08/22/15 Stable- continue dual antiplatelet therapy with Brilinta and ASA Followed by [facility] Heart care  4. Dyslipidemia Controlled on statin Lipid panel ordered  5. Chronic diastolic heart failure ([place]) Euvolemic She does have some shortness of breath on climbing the stairs-NYHA 2 Lost 2 pounds since office visit 3 months ago Weight largely contributory as there are no signs of fluid overload - isosorbide mononitrate (IMDUR) 30 MG 24 hr tablet; Take 1 tablet (30 mg total) by mouth daily.  Dispense: 30 tablet; Refill: 2  6. Obesity- BMI 36 Advised  to commence an exercise regimen walking 15 minutes to 30 minutes daily on the 2 days when she is off hopefully this will help with her breathing; reduce portion sizes.  7. 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

## 2016-05-01 NOTE — Patient Instructions (Signed)

## 2016-05-06 ENCOUNTER — Ambulatory Visit: Payer: Self-pay | Attending: Family Medicine

## 2016-05-06 DIAGNOSIS — E785 Hyperlipidemia, unspecified: Secondary | ICD-10-CM | POA: Insufficient documentation

## 2016-05-06 DIAGNOSIS — IMO0002 Reserved for concepts with insufficient information to code with codable children: Secondary | ICD-10-CM

## 2016-05-06 DIAGNOSIS — E1159 Type 2 diabetes mellitus with other circulatory complications: Secondary | ICD-10-CM

## 2016-05-06 DIAGNOSIS — E1165 Type 2 diabetes mellitus with hyperglycemia: Secondary | ICD-10-CM | POA: Insufficient documentation

## 2016-05-06 LAB — COMPLETE METABOLIC PANEL WITH GFR
ALBUMIN: 3.9 g/dL (ref 3.6–5.1)
ALK PHOS: 76 U/L (ref 33–130)
ALT: 14 U/L (ref 6–29)
AST: 13 U/L (ref 10–35)
BILIRUBIN TOTAL: 0.6 mg/dL (ref 0.2–1.2)
BUN: 17 mg/dL (ref 7–25)
CALCIUM: 9.6 mg/dL (ref 8.6–10.4)
CO2: 26 mmol/L (ref 20–31)
Chloride: 105 mmol/L (ref 98–110)
Creat: 0.8 mg/dL (ref 0.50–1.05)
GFR, EST NON AFRICAN AMERICAN: 82 mL/min (ref >=60)
GFR, Est African American: >89 mL/min (ref >=60)
Glucose, Bld: 104 mg/dL — ABNORMAL HIGH (ref 65–99)
POTASSIUM: 3.5 mmol/L (ref 3.5–5.3)
Sodium: 142 mmol/L (ref 135–146)
TOTAL PROTEIN: 6.5 g/dL (ref 6.1–8.1)

## 2016-05-06 LAB — LIPID PANEL
CHOL/HDL RATIO: 2.3 ratio (ref <=5.0)
CHOLESTEROL: 112 mg/dL — AB (ref 125–200)
HDL: 48 mg/dL (ref >=46)
LDL Cholesterol: 49 mg/dL (ref <130)
Triglycerides: 76 mg/dL (ref <150)
VLDL: 15 mg/dL (ref <30)

## 2016-05-06 NOTE — Patient Instructions (Signed)
Patient is aware of receiving results by phone.

## 2016-05-07 LAB — MICROALBUMIN, URINE: Microalb, Ur: 2.3 mg/dL

## 2016-05-07 LAB — HEMOGLOBIN A1C
Hgb A1c MFr Bld: 7.9 % — ABNORMAL HIGH (ref <5.7)
Mean Plasma Glucose: 180 mg/dL

## 2016-05-16 MED FILL — metFORMIN HCL 500 MG TABS: 500 | 30 days supply | Qty: 30 | Fill #3

## 2016-05-16 MED FILL — ISOSORBIDE MN ER 30 MG TAB: 30 | 30 days supply | Qty: 30 | Fill #4

## 2016-05-16 MED FILL — ?METOPROLOL 100 MG TABLET: 100 | 30 days supply | Qty: 60 | Fill #7

## 2016-05-16 MED FILL — ATORVASTATIN 80 MG TABLET: 80 | 30 days supply | Qty: 30 | Fill #7

## 2016-05-24 MED FILL — FUROSEMIDE 40 MG TABLET: 40 | 30 days supply | Qty: 30 | Fill #5

## 2016-06-06 MED FILL — INVOKANA 100 MG TABLET: 100 | 30 days supply | Qty: 30 | Fill #7

## 2016-06-06 MED FILL — ?GLIMEPIRIDE 4 MG TABLET: 4 | 30 days supply | Qty: 30 | Fill #2

## 2016-06-06 MED FILL — LISINOPRIL 40 MG TABLET: 40 | 30 days supply | Qty: 30 | Fill #5

## 2016-06-07 ENCOUNTER — Ambulatory Visit: Payer: Self-pay | Attending: Family Medicine | Admitting: Family Medicine

## 2016-06-07 ENCOUNTER — Encounter: Payer: Self-pay | Admitting: Family Medicine

## 2016-06-07 ENCOUNTER — Encounter: Payer: Self-pay | Admitting: Gastroenterology

## 2016-06-07 VITALS — BP 113/68 | HR 63 | Temp 98.2°F | Resp 16 | Ht 62.0 in | Wt 207.0 lb

## 2016-06-07 DIAGNOSIS — Z79899 Other long term (current) drug therapy: Secondary | ICD-10-CM | POA: Insufficient documentation

## 2016-06-07 DIAGNOSIS — IMO0002 Reserved for concepts with insufficient information to code with codable children: Secondary | ICD-10-CM

## 2016-06-07 DIAGNOSIS — E1165 Type 2 diabetes mellitus with hyperglycemia: Secondary | ICD-10-CM | POA: Insufficient documentation

## 2016-06-07 DIAGNOSIS — E1159 Type 2 diabetes mellitus with other circulatory complications: Secondary | ICD-10-CM

## 2016-06-07 DIAGNOSIS — Z1211 Encounter for screening for malignant neoplasm of colon: Secondary | ICD-10-CM

## 2016-06-07 DIAGNOSIS — Z0000 Encounter for general adult medical examination without abnormal findings: Secondary | ICD-10-CM

## 2016-06-07 DIAGNOSIS — Z1239 Encounter for other screening for malignant neoplasm of breast: Secondary | ICD-10-CM

## 2016-06-07 DIAGNOSIS — Z7984 Long term (current) use of oral hypoglycemic drugs: Secondary | ICD-10-CM | POA: Insufficient documentation

## 2016-06-07 DIAGNOSIS — Z124 Encounter for screening for malignant neoplasm of cervix: Secondary | ICD-10-CM

## 2016-06-07 DIAGNOSIS — Z7982 Long term (current) use of aspirin: Secondary | ICD-10-CM | POA: Insufficient documentation

## 2016-06-07 DIAGNOSIS — Z23 Encounter for immunization: Secondary | ICD-10-CM

## 2016-06-07 LAB — GLUCOSE, POCT (MANUAL RESULT ENTRY): POC Glucose: 216 mg/dl — AB (ref 70–99)

## 2016-06-07 NOTE — Progress Notes (Signed)
Subjective:  Patient ID: [name], female    DOB: 29-Jun-1956  Age: 60 y.o. MRN: [id number]  CC: Annual Exam   HPI KHLOE RENSCH presents for a complete physical exam and has no complaints today.  Outpatient Prescriptions Prior to Visit  Medication Sig Dispense Refill  . acetaminophen (TYLENOL) 500 MG tablet Take 1,000 mg by mouth daily as needed for moderate pain.     Marland Kitchen aspirin EC 81 MG EC tablet Take 1 tablet (81 mg total) by mouth daily.    Marland Kitchen atorvastatin (LIPITOR) 80 MG tablet Take 1 tablet (80 mg total) by mouth daily at 6 PM. 30 tablet 11  . canagliflozin (INVOKANA) 100 MG TABS tablet Take 1 tablet (100 mg total) by mouth daily. 90 tablet 3  . glimepiride (AMARYL) 4 MG tablet Take 1 tablet (4 mg total) by mouth 2 (two) times daily with a meal. 60 tablet 2  . isosorbide mononitrate (IMDUR) 30 MG 24 hr tablet Take 1 tablet (30 mg total) by mouth daily. 30 tablet 2  . lisinopril (PRINIVIL,ZESTRIL) 40 MG tablet Take 1 tablet (40 mg total) by mouth daily. 30 tablet 6  . metFORMIN (GLUCOPHAGE) 500 MG tablet Take 1 tablet (500 mg total) by mouth daily with breakfast. 60 tablet 3  . metoprolol tartrate (LOPRESSOR) 100 MG tablet Take 1 tablet (100 mg total) by mouth 2 (two) times daily. 60 tablet 11  . nitroGLYCERIN (NITROSTAT) 0.4 MG SL tablet Place 1 tablet (0.4 mg total) under the tongue every 5 (five) minutes as needed for chest pain. 25 tablet 2  . ranitidine (ZANTAC) 150 MG capsule Take 150 mg by mouth daily as needed for heartburn.    . ticagrelor (BRILINTA) 90 MG TABS tablet Take 1 tablet (90 mg total) by mouth 2 (two) times daily. 180 tablet 3   No facility-administered medications prior to visit.    ROS Review of Systems  Constitutional: Negative for activity change, appetite change and fatigue.  HENT: Negative for congestion, sinus pressure and sore throat.   Eyes: Negative for visual disturbance.  Respiratory: Negative for cough, chest tightness, shortness of breath  and wheezing.   Cardiovascular: Negative for chest pain and palpitations.  Gastrointestinal: Negative for abdominal pain, constipation and abdominal distention.  Endocrine: Negative for polydipsia.  Genitourinary: Negative for dysuria and frequency.  Musculoskeletal: Negative for back pain and arthralgias.  Skin: Negative for rash.  Neurological: Negative for tremors, light-headedness and numbness.  Hematological: Does not bruise/bleed easily.  Psychiatric/Behavioral: Negative for behavioral problems and agitation.    Objective:  BP 113/68 mmHg  Pulse 63  Temp(Src) 98.2 F (36.8 C) (Oral)  Resp 16  Ht 5\' 2"  (1.575 m)  Wt 207 lb (93.895 kg)  BMI 37.85 kg/m2  SpO2 98%  BP/Weight 06/07/2016 05/01/2016 Q000111Q  Systolic BP 123456 [id number] Q000111Q  Diastolic BP 68 86 90  Wt. (Lbs) 207 206 209.6  BMI 37.85 37.67 37.14      Physical Exam  Constitutional: She is oriented to person, place, and time. She appears well-developed and well-nourished. No distress.  HENT:  Head: Normocephalic.  Right Ear: External ear normal.  Left Ear: External ear normal.  Nose: Nose normal.  Mouth/Throat: Oropharynx is clear and moist.  Eyes: Conjunctivae and EOM are normal. Pupils are equal, round, and reactive to light.  Neck: Normal range of motion. No JVD present.  Cardiovascular: Normal rate, regular rhythm, normal heart sounds and intact distal pulses.  Exam reveals no gallop.  No murmur heard. Pulmonary/Chest: Effort normal and breath sounds normal. No respiratory distress. She has no wheezes. She has no rales. She exhibits no tenderness. Right breast exhibits no mass, no skin change and no tenderness. Left breast exhibits no mass, no skin change and no tenderness.  Abdominal: Soft. Bowel sounds are normal. She exhibits no distension and no mass. There is no tenderness.  Genitourinary: Vagina normal. Guaiac positive stool. No vaginal discharge found.  Musculoskeletal: Normal range of motion. She  exhibits no edema or tenderness.  Neurological: She is alert and oriented to person, place, and time. She has normal reflexes.  Skin: Skin is warm and dry. She is not diaphoretic.  Psychiatric: She has a normal mood and affect.     Assessment & Plan:   1. Screening for cervical cancer - Cytology - PAP ([facility])   2. Screening for breast cancer - Mammogram Digital Screening; Future   3. Screening for colon cancer - Ambulatory referral to Gastroenterology Advised to apply following [place] card which will help with referrals.   4. Routine general medical examination at a health care facility - Tdap vaccine greater than or equal to 7yo IM - Hepatitis C antibody screen - HIV antibody   5. Uncontrolled type 2 diabetes mellitus with other circulatory complication, without long-term current use of insulin (HCC) A1c of 7.9 which has improved from 8.4 but not at goal of less than 7. - Glucose (CBG)   No orders of the defined types were placed in this encounter.    Follow-up: Return in 3 months (on 09/07/2016) for Follow-up on diabetes.   Arnoldo Morale MD

## 2016-06-07 NOTE — Progress Notes (Signed)
Pt here for physical. Pt denies any pain.  Pt took medications today. Pt has eaten today. CBG is 216.

## 2016-06-07 NOTE — Patient Instructions (Signed)
Health Maintenance, Female Adopting a healthy lifestyle and getting preventive care can go a long way to promote health and wellness. Talk with your health care provider about what schedule of regular examinations is right for you. This is a good chance for you to check in with your provider about disease prevention and staying healthy. In between checkups, there are plenty of things you can do on your own. Experts have done a lot of research about which lifestyle changes and preventive measures are most likely to keep you healthy. Ask your health care provider for more information. WEIGHT AND DIET  Eat a healthy diet  Be sure to include plenty of vegetables, fruits, low-fat dairy products, and lean protein.  Do not eat a lot of foods high in solid fats, added sugars, or salt.  Get regular exercise. This is one of the most important things you can do for your health.  Most adults should exercise for at least 150 minutes each week. The exercise should increase your heart rate and make you sweat (moderate-intensity exercise).  Most adults should also do strengthening exercises at least twice a week. This is in addition to the moderate-intensity exercise.  Maintain a healthy weight  Body mass index (BMI) is a measurement that can be used to identify possible weight problems. It estimates body fat based on height and weight. Your health care provider can help determine your BMI and help you achieve or maintain a healthy weight.  For females 28 years of age and older:   A BMI below 18.5 is considered underweight.  A BMI of 18.5 to 24.9 is normal.  A BMI of 25 to 29.9 is considered overweight.  A BMI of 30 and above is considered obese.  Watch levels of cholesterol and blood lipids  You should start having your blood tested for lipids and cholesterol at 58 years of age, then have this test every 5 years.  You may need to have your cholesterol levels checked more often if:  Your lipid  or cholesterol levels are high.  You are older than 58 years of age.  You are at high risk for heart disease.  CANCER SCREENING   Lung Cancer  Lung cancer screening is recommended for adults 75-66 years old who are at high risk for lung cancer because of a history of smoking.  A yearly low-dose CT scan of the lungs is recommended for people who:  Currently smoke.  Have quit within the past 15 years.  Have at least a 30-pack-year history of smoking. A pack year is smoking an average of one pack of cigarettes a day for 1 year.  Yearly screening should continue until it has been 15 years since you quit.  Yearly screening should stop if you develop a health problem that would prevent you from having lung cancer treatment.  Breast Cancer  Practice breast self-awareness. This means understanding how your breasts normally appear and feel.  It also means doing regular breast self-exams. Let your health care provider know about any changes, no matter how small.  If you are in your 20s or 30s, you should have a clinical breast exam (CBE) by a health care provider every 1-3 years as part of a regular health exam.  If you are 25 or older, have a CBE every year. Also consider having a breast X-ray (mammogram) every year.  If you have a family history of breast cancer, talk to your health care provider about genetic screening.  If you  are at high risk for breast cancer, talk to your health care provider about having an MRI and a mammogram every year.  Breast cancer gene (BRCA) assessment is recommended for women who have family members with BRCA-related cancers. BRCA-related cancers include:  Breast.  Ovarian.  Tubal.  Peritoneal cancers.  Results of the assessment will determine the need for genetic counseling and BRCA1 and BRCA2 testing. Cervical Cancer Your health care provider may recommend that you be screened regularly for cancer of the pelvic organs (ovaries, uterus, and  vagina). This screening involves a pelvic examination, including checking for microscopic changes to the surface of your cervix (Pap test). You may be encouraged to have this screening done every 3 years, beginning at age 21.  For women ages 30-65, health care providers may recommend pelvic exams and Pap testing every 3 years, or they may recommend the Pap and pelvic exam, combined with testing for human papilloma virus (HPV), every 5 years. Some types of HPV increase your risk of cervical cancer. Testing for HPV may also be done on women of any age with unclear Pap test results.  Other health care providers may not recommend any screening for nonpregnant women who are considered low risk for pelvic cancer and who do not have symptoms. Ask your health care provider if a screening pelvic exam is right for you.  If you have had past treatment for cervical cancer or a condition that could lead to cancer, you need Pap tests and screening for cancer for at least 20 years after your treatment. If Pap tests have been discontinued, your risk factors (such as having a new sexual partner) need to be reassessed to determine if screening should resume. Some women have medical problems that increase the chance of getting cervical cancer. In these cases, your health care provider may recommend more frequent screening and Pap tests. Colorectal Cancer  This type of cancer can be detected and often prevented.  Routine colorectal cancer screening usually begins at 58 years of age and continues through 58 years of age.  Your health care provider may recommend screening at an earlier age if you have risk factors for colon cancer.  Your health care provider may also recommend using home test kits to check for hidden blood in the stool.  A small camera at the end of a tube can be used to examine your colon directly (sigmoidoscopy or colonoscopy). This is done to check for the earliest forms of colorectal  cancer.  Routine screening usually begins at age 50.  Direct examination of the colon should be repeated every 5-10 years through 58 years of age. However, you may need to be screened more often if early forms of precancerous polyps or small growths are found. Skin Cancer  Check your skin from head to toe regularly.  Tell your health care provider about any new moles or changes in moles, especially if there is a change in a mole's shape or color.  Also tell your health care provider if you have a mole that is larger than the size of a pencil eraser.  Always use sunscreen. Apply sunscreen liberally and repeatedly throughout the day.  Protect yourself by wearing long sleeves, pants, a wide-brimmed hat, and sunglasses whenever you are outside. HEART DISEASE, DIABETES, AND HIGH BLOOD PRESSURE   High blood pressure causes heart disease and increases the risk of stroke. High blood pressure is more likely to develop in:  People who have blood pressure in the high end   of the normal range (130-139/85-89 mm Hg).  People who are overweight or obese.  People who are African American.  If you are 38-23 years of age, have your blood pressure checked every 3-5 years. If you are 61 years of age or older, have your blood pressure checked every year. You should have your blood pressure measured twice--once when you are at a hospital or clinic, and once when you are not at a hospital or clinic. Record the average of the two measurements. To check your blood pressure when you are not at a hospital or clinic, you can use:  An automated blood pressure machine at a pharmacy.  A home blood pressure monitor.  If you are between 45 years and 39 years old, ask your health care provider if you should take aspirin to prevent strokes.  Have regular diabetes screenings. This involves taking a blood sample to check your fasting blood sugar level.  If you are at a normal weight and have a low risk for diabetes,  have this test once every three years after 58 years of age.  If you are overweight and have a high risk for diabetes, consider being tested at a younger age or more often. PREVENTING INFECTION  Hepatitis B  If you have a higher risk for hepatitis B, you should be screened for this virus. You are considered at high risk for hepatitis B if:  You were born in a country where hepatitis B is common. Ask your health care provider which countries are considered high risk.  Your parents were born in a high-risk country, and you have not been immunized against hepatitis B (hepatitis B vaccine).  You have HIV or AIDS.  You use needles to inject street drugs.  You live with someone who has hepatitis B.  You have had sex with someone who has hepatitis B.  You get hemodialysis treatment.  You take certain medicines for conditions, including cancer, organ transplantation, and autoimmune conditions. Hepatitis C  Blood testing is recommended for:  Everyone born from [birth year] through 1963.  Anyone with known risk factors for hepatitis C. Sexually transmitted infections (STIs)  You should be screened for sexually transmitted infections (STIs) including gonorrhea and chlamydia if:  You are sexually active and are younger than 58 years of age.  You are older than 58 years of age and your health care provider tells you that you are at risk for this type of infection.  Your sexual activity has changed since you were last screened and you are at an increased risk for chlamydia or gonorrhea. Ask your health care provider if you are at risk.  If you do not have HIV, but are at risk, it may be recommended that you take a prescription medicine daily to prevent HIV infection. This is called pre-exposure prophylaxis (PrEP). You are considered at risk if:  You are sexually active and do not regularly use condoms or know the HIV status of your partner(s).  You take drugs by injection.  You are sexually  active with a partner who has HIV. Talk with your health care provider about whether you are at high risk of being infected with HIV. If you choose to begin PrEP, you should first be tested for HIV. You should then be tested every 3 months for as long as you are taking PrEP.  PREGNANCY   If you are premenopausal and you may become pregnant, ask your health care provider about preconception counseling.  If you may  become pregnant, take 400 to 800 micrograms (mcg) of folic acid every day.  If you want to prevent pregnancy, talk to your health care provider about birth control (contraception). OSTEOPOROSIS AND MENOPAUSE   Osteoporosis is a disease in which the bones lose minerals and strength with aging. This can result in serious bone fractures. Your risk for osteoporosis can be identified using a bone density scan.  If you are 61 years of age or older, or if you are at risk for osteoporosis and fractures, ask your health care provider if you should be screened.  Ask your health care provider whether you should take a calcium or vitamin D supplement to lower your risk for osteoporosis.  Menopause may have certain physical symptoms and risks.  Hormone replacement therapy may reduce some of these symptoms and risks. Talk to your health care provider about whether hormone replacement therapy is right for you.  HOME CARE INSTRUCTIONS   Schedule regular health, dental, and eye exams.  Stay current with your immunizations.   Do not use any tobacco products including cigarettes, chewing tobacco, or electronic cigarettes.  If you are pregnant, do not drink alcohol.  If you are breastfeeding, limit how much and how often you drink alcohol.  Limit alcohol intake to no more than 1 drink per day for nonpregnant women. One drink equals 12 ounces of beer, 5 ounces of wine, or 1 ounces of hard liquor.  Do not use street drugs.  Do not share needles.  Ask your health care provider for help if  you need support or information about quitting drugs.  Tell your health care provider if you often feel depressed.  Tell your health care provider if you have ever been abused or do not feel safe at home.   This information is not intended to replace advice given to you by your health care provider. Make sure you discuss any questions you have with your health care provider.   Document Released: 06/17/2011 Document Revised: 12/23/2014 Document Reviewed: 11/03/2013 Elsevier Interactive Patient Education [employer].

## 2016-06-08 LAB — HIV ANTIBODY (ROUTINE TESTING W REFLEX): HIV 1&2 Ab, 4th Generation: NONREACTIVE

## 2016-06-08 LAB — HEPATITIS C ANTIBODY: HCV Ab: NEGATIVE

## 2016-06-11 LAB — CYTOLOGY - PAP

## 2016-06-17 ENCOUNTER — Other Ambulatory Visit: Payer: Self-pay | Admitting: Cardiovascular Disease

## 2016-06-17 ENCOUNTER — Other Ambulatory Visit: Payer: Self-pay | Admitting: Cardiology

## 2016-06-17 MED FILL — ?METOPROLOL 100 MG TABLET: 100 | 30 days supply | Qty: 60 | Fill #8

## 2016-06-17 MED FILL — FUROSEMIDE 40 MG TABLET: 40 | 30 days supply | Qty: 30 | Fill #0

## 2016-06-17 MED FILL — ?METFORMIN HCL 500MG TABLET: 500 | 30 days supply | Qty: 30 | Fill #4

## 2016-06-17 MED FILL — ISOSORBIDE MN ER 30 MG TAB: 30 | 30 days supply | Qty: 30 | Fill #0

## 2016-06-17 NOTE — Telephone Encounter (Signed)
Rx request sent to pharmacy.  

## 2016-06-24 MED FILL — ATORVASTATIN 80 MG TABLET: 80 | 30 days supply | Qty: 30 | Fill #8

## 2016-07-09 MED FILL — LISINOPRIL 40 MG TABLET: 40 | 30 days supply | Qty: 30 | Fill #6

## 2016-07-09 MED FILL — ?GLIMEPIRIDE 4 MG TABLET: 4 | 30 days supply | Qty: 30 | Fill #3

## 2016-07-09 MED FILL — INVOKANA 100 MG TABLET: 100 | 30 days supply | Qty: 30 | Fill #8

## 2016-07-17 ENCOUNTER — Other Ambulatory Visit: Payer: Self-pay | Admitting: Family Medicine

## 2016-07-17 DIAGNOSIS — Z1231 Encounter for screening mammogram for malignant neoplasm of breast: Secondary | ICD-10-CM

## 2016-07-19 ENCOUNTER — Telehealth: Payer: Self-pay

## 2016-07-19 NOTE — Telephone Encounter (Signed)
Clld pt at home to schedule ov with extender, pt takes Brilinta. PV canceled.

## 2016-07-24 MED FILL — ATORVASTATIN 80 MG TABLET: 80 | 30 days supply | Qty: 30 | Fill #9

## 2016-07-24 MED FILL — ?METFORMIN HCL 500MG TABLET: 500 | 30 days supply | Qty: 30 | Fill #5

## 2016-07-24 MED FILL — ISOSORBIDE MN ER 30 MG TAB: 30 | 30 days supply | Qty: 30 | Fill #1

## 2016-07-24 MED FILL — ?FUROSEMIDE 20 MG TABLET: 20 | 30 days supply | Qty: 60 | Fill #0

## 2016-07-24 MED FILL — ?METOPROLOL 100 MG TABLET: 100 | 30 days supply | Qty: 60 | Fill #9

## 2016-07-25 ENCOUNTER — Encounter: Payer: Self-pay | Admitting: Physician Assistant

## 2016-07-25 ENCOUNTER — Telehealth: Payer: Self-pay | Admitting: *Deleted

## 2016-07-25 ENCOUNTER — Ambulatory Visit (INDEPENDENT_AMBULATORY_CARE_PROVIDER_SITE_OTHER): Payer: Self-pay | Admitting: Physician Assistant

## 2016-07-25 VITALS — BP 128/80 | HR 68 | Ht 63.25 in | Wt 203.4 lb

## 2016-07-25 DIAGNOSIS — Z1211 Encounter for screening for malignant neoplasm of colon: Secondary | ICD-10-CM

## 2016-07-25 DIAGNOSIS — Z7901 Long term (current) use of anticoagulants: Secondary | ICD-10-CM

## 2016-07-25 MED ORDER — NA SULFATE-K SULFATE-MG SULF 17.5-3.13-1.6 GM/177ML PO SOLN: 1.0000 | Freq: Once | ORAL | 0 refills | Status: AC

## 2016-07-25 NOTE — Telephone Encounter (Signed)
RE: [name] DOB: 12/07/1956 MRN: [id number]   Dear Dr. Sanda Klein,    We have scheduled the above patient for an endoscopic procedure. Our records show that she is on anticoagulation therapy.   Please advise as to how long the patient may come off her therapy of Brilinta prior to the procedure, which is scheduled for  09-20-2016. Please fax back/ or route the completed form to [facility] at [phone].   Sincerely,    Amy Esterwood PA-C

## 2016-07-25 NOTE — Patient Instructions (Signed)
We will call you after we hear from Dr. Orene Desanctis regarding the [place].   You have been scheduled for a colonoscopy. Please follow written instructions given to you at your visit today.  Please pick up your prep supplies at the pharmacy within the next 1-3 days. If you use inhalers (even only as needed), please bring them with you on the day of your procedure. Your physician has requested that you go to www.startemmi.com and enter the access code given to you at your visit today. This web site gives a general overview about your procedure. However, you should still follow specific instructions given to you by our office regarding your preparation for the procedure.

## 2016-07-25 NOTE — Telephone Encounter (Signed)
[name], please let them know that she will be discontinuing the Brilinta on September 6. By the time she comes in for her endoscopy in October this should no longer be an issue.  [name], please also make sure Mrs. [name] knows that she is to discontinue the Brilinta in September. She is to remain on aspirin 81 mg daily. Thank you

## 2016-07-25 NOTE — Progress Notes (Signed)
Agree with assessment and plan as outlined.  

## 2016-07-25 NOTE — Progress Notes (Signed)
Subjective:    Patient ID: [name], female    DOB: 26-Jun-1956, 60 y.o.   MRN: [id number]  HPI  [name] is a pleasant 60 year old African-American female new to GI today referred by [facility] for screening colonoscopy. Patient has not had any prior GI evaluation. She is currently asymptomatic, specifically denies any problems with abdominal discomfort, changes in bowel habits melena or hematochezia. Family history is negative for colon cancer as far she is aware. Patient does have history of morbid obesity with BMI of 35, congestive heart failure with EF of 60%, diabetes mellitus and is status post MI in September 2016. She had a drug-eluting stent placed to the RCA and is followed by Dr. Alfonzo Feller. She is currently on baby aspirin and Brilinta. Cardiology notes are reviewed and plan is for her to remain on Brilinta for at least 1 year. Patient says she has a follow-up with Cardiology  In September. She says she has been doing well since  the MI  and has had no problems with chest pain fatigue or shortness of breath.  Review of Systems Pertinent positive and negative review of systems were noted in the above HPI section.  All other review of systems was otherwise negative.  Outpatient Encounter Prescriptions as of 07/25/2016  Medication Sig  . acetaminophen (TYLENOL) 500 MG tablet Take 1,000 mg by mouth daily as needed for moderate pain.   Marland Kitchen aspirin EC 81 MG EC tablet Take 1 tablet (81 mg total) by mouth daily.  Marland Kitchen atorvastatin (LIPITOR) 80 MG tablet Take 1 tablet (80 mg total) by mouth daily at 6 PM.  . canagliflozin (INVOKANA) 100 MG TABS tablet Take 1 tablet (100 mg total) by mouth daily.  . furosemide (LASIX) 40 MG tablet TAKE 1 TABLET BY MOUTH DAILY  . glimepiride (AMARYL) 4 MG tablet Take 1 tablet (4 mg total) by mouth 2 (two) times daily with a meal.  . isosorbide mononitrate (IMDUR) 30 MG 24 hr tablet TAKE 1 TABLET BY MOUTH DAILY  . lisinopril (PRINIVIL,ZESTRIL) 40 MG tablet Take 1  tablet (40 mg total) by mouth daily.  . metFORMIN (GLUCOPHAGE) 500 MG tablet Take 1 tablet (500 mg total) by mouth daily with breakfast.  . metoprolol tartrate (LOPRESSOR) 100 MG tablet Take 1 tablet (100 mg total) by mouth 2 (two) times daily.  . ranitidine (ZANTAC) 150 MG capsule Take 150 mg by mouth daily as needed for heartburn.  . ticagrelor (BRILINTA) 90 MG TABS tablet Take 1 tablet (90 mg total) by mouth 2 (two) times daily.  . Na Sulfate-K Sulfate-Mg Sulf 17.5-3.13-1.6 GM/180ML SOLN Take 1 kit by mouth once.  . nitroGLYCERIN (NITROSTAT) 0.4 MG SL tablet Place 1 tablet (0.4 mg total) under the tongue every 5 (five) minutes as needed for chest pain. (Patient not taking: Reported on 07/25/2016)   No facility-administered encounter medications on file as of 07/25/2016.    No Known Allergies Patient Active Problem List   Diagnosis Date Noted  . Osteoarthritis of both hands 05/01/2016  . Diastolic heart failure ([place]) 12/06/2015  . CAD (coronary artery disease), native coronary artery 08/30/2015  . Non-compliance with treatment-(cost) 08/24/2015  . Hypertensive cardiovascular disease 08/24/2015  . Dyslipidemia 08/24/2015  . CAD -S/P RCA DES 08/22/15   . NSTEMI (non-ST elevated myocardial infarction) ([place]) 08/18/2015  . Obesity- BMI 36 06/15/2013  . Type 2 diabetes mellitus, uncontrolled ([place]) 06/04/2013  . Essential hypertension 06/04/2013   Social History   Social History  . Marital status: Divorced  Spouse name: N/A  . Number of children: 1  . Years of education: N/A   Occupational History  . cashier    Social History Main Topics  . Smoking status: Former Smoker    Years: 25.00    Quit date: 07/03/2004  . Smokeless tobacco: Never Used  . Alcohol use Yes     Comment: ocassionally   . Drug use: No  . Sexual activity: Not on file   Other Topics Concern  . Not on file   Social History Narrative  . No narrative on file    [name]'s family history includes Diabetes in  her brother, mother, and sister; Hypertension in her brother and mother.      Objective:    Vitals:   07/25/16 1329  BP: 128/80  Pulse: 68    Physical Exam  well-developed African-American female in no acute distress, pleasant blood pressure 128/80 pulse 68 height 5 foot 3 weight 203, BMI 35. HEENT; nontraumatic normocephalic EOMI PERRLA sclera anicteric, Cardiovascular ;regular rate and rhythm with S1-S2 no murmur or gallop, Pulmonary ;clear bilaterally, Abdomen; obese soft nontender nondistended bowel sounds are active there is no palpable mass or hepatosplenomegaly, Rectal ;exam not done, Extremities ;no clubbing cyanosis or edema skin warm dry, Neuropsych; mood and affect appropriate       Assessment & Plan:   #81  60 year old female referred for screening colonoscopy. Patient is average risk and asymptomatic #2 chronic antiplatelet therapy-on aspirin and Brilinta #3 coronary artery disease status post MI September 2016-patient has drug-eluting stent to the RCA #4 congestive heart failure EF 60% #5  morbid obesity BMI 35 #6 diabetes mellitus  Plan; Colonoscopy discussed in detail with the patient noting risks and benefits and she is agreeable to proceed. We will schedule for colonoscopy with Dr. Havery Moros in October. As she had an MI less than 1 year ago with drug-eluting stent she will need clearance from her cardiologist prior to holding Brilinta for 5 days prior to procedure. She has cardiology follow-up in September with Dr.Croitoiri We will communicate with him to assure  that holding Brilinta for 5 days after she reaches the one year mark post stent  is acceptable for this patient.[facility] Nurse Cahlil Sattar PA-C 07/25/2016   Cc: Arnoldo Morale, MD

## 2016-07-26 NOTE — Telephone Encounter (Signed)
I called the patient and advised that Dr. Sallyanne Kuster responded to me and advised that he wants the patient to stop the [place] on 08-21-2016. By the time she comes in for her endoscopy in October this should no longer be an issue.  The patient thanked me for calling to let her know.

## 2016-08-12 ENCOUNTER — Encounter: Payer: Self-pay | Admitting: Gastroenterology

## 2016-08-20 ENCOUNTER — Other Ambulatory Visit: Payer: Self-pay | Admitting: Cardiovascular Disease

## 2016-08-20 MED FILL — INVOKANA 100 MG TABLET: 100 | 30 days supply | Qty: 30 | Fill #9

## 2016-08-20 MED FILL — ?GLIMEPIRIDE 4 MG TABLET: 4 | 30 days supply | Qty: 30 | Fill #4

## 2016-08-20 MED FILL — ?LISINOPRIL 40 MG TABLET: 40 MG | 30 days supply | Qty: 30 | Fill #0

## 2016-09-06 ENCOUNTER — Other Ambulatory Visit: Payer: Self-pay | Admitting: Cardiology

## 2016-09-06 MED FILL — FUROSEMIDE 20 MG TABLET: 20 | 30 days supply | Qty: 60 | Fill #1

## 2016-09-06 MED FILL — ISOSORBIDE MN ER 30 MG TAB: 30 | 30 days supply | Qty: 30 | Fill #2

## 2016-09-06 MED FILL — ATORVASTATIN 80 MG TABLET: 80 | 30 days supply | Qty: 30 | Fill #0

## 2016-09-06 MED FILL — ?METOPROLOL 100 MG TABLET: 100 | 30 days supply | Qty: 60 | Fill #0

## 2016-09-06 MED FILL — metFORMIN HCL 500 MG TABS: 500 | 30 days supply | Qty: 30 | Fill #6

## 2016-09-20 ENCOUNTER — Encounter: Payer: Self-pay | Admitting: Gastroenterology

## 2016-09-30 ENCOUNTER — Other Ambulatory Visit: Payer: Self-pay | Admitting: Family Medicine

## 2016-09-30 MED FILL — INVOKANA 100 MG TABLET: 100 | 30 days supply | Qty: 30 | Fill #10

## 2016-09-30 MED FILL — GLIMEPIRIDE 4 MG TABLET: 4 | 30 days supply | Qty: 60 | Fill #0

## 2016-10-07 ENCOUNTER — Other Ambulatory Visit: Payer: Self-pay | Admitting: *Deleted

## 2016-10-07 MED ORDER — LISINOPRIL 40 MG PO TABS: 40.0000 mg | ORAL_TABLET | Freq: Every day | ORAL | 1 refills | Status: DC

## 2016-10-07 MED FILL — LISINOPRIL 40 MG TABLET: 40 | 30 days supply | Qty: 30 | Fill #0

## 2016-10-10 MED FILL — FUROSEMIDE 20 MG TABLET: 20 | 30 days supply | Qty: 60 | Fill #2

## 2016-10-10 MED FILL — ?METOPROLOL 100 MG TABLET: 100 | 30 days supply | Qty: 60 | Fill #1

## 2016-10-10 MED FILL — ISOSORBIDE MN ER 30 MG TAB: 30 | 30 days supply | Qty: 30 | Fill #3

## 2016-10-10 MED FILL — metFORMIN HCL 500 MG TABS: 500 | 30 days supply | Qty: 30 | Fill #7

## 2016-10-10 MED FILL — ATORVASTATIN 80 MG TABLET: 80 | 30 days supply | Qty: 30 | Fill #1

## 2016-10-29 ENCOUNTER — Other Ambulatory Visit: Payer: Self-pay | Admitting: Family Medicine

## 2016-10-29 MED FILL — INVOKANA 100 MG TABLET: 100 | 30 days supply | Qty: 30 | Fill #0

## 2016-10-29 MED FILL — GLIMEPIRIDE 4 MG TABLET: 4 | 30 days supply | Qty: 60 | Fill #0

## 2016-11-12 ENCOUNTER — Other Ambulatory Visit: Payer: Self-pay | Admitting: Family Medicine

## 2016-11-12 ENCOUNTER — Other Ambulatory Visit: Payer: Self-pay | Admitting: Cardiovascular Disease

## 2016-11-12 DIAGNOSIS — E1165 Type 2 diabetes mellitus with hyperglycemia: Secondary | ICD-10-CM

## 2016-11-12 MED FILL — LISINOPRIL 40 MG TABLET: 40 | 30 days supply | Qty: 30 | Fill #1

## 2016-11-12 MED FILL — METOPROLOL TARTRATE 100 MG: 100 | 30 days supply | Qty: 60 | Fill #2

## 2016-11-12 MED FILL — FUROSEMIDE 20 MG TABLET: 20 | 30 days supply | Qty: 60 | Fill #3

## 2016-11-12 MED FILL — ?METFORMIN HCL 500MG TABLET: 500 | 30 days supply | Qty: 30 | Fill #0

## 2016-11-12 MED FILL — ATORVASTATIN 80 MG TABLET: 80 | 30 days supply | Qty: 30 | Fill #2

## 2016-11-13 MED FILL — ?ISOSORBIDE MN ER 30 MG TAB: 30 | 30 days supply | Qty: 30 | Fill #0

## 2016-11-28 ENCOUNTER — Other Ambulatory Visit: Payer: Self-pay

## 2016-11-28 MED ORDER — INVOKANA 100 MG PO TABS: 100.0000 mg | ORAL_TABLET | Freq: Every day | ORAL | 3 refills | Status: DC

## 2016-12-11 ENCOUNTER — Other Ambulatory Visit: Payer: Self-pay | Admitting: Family Medicine

## 2016-12-11 MED FILL — INVOKANA 100 MG TABLET: 100 | 30 days supply | Qty: 30 | Fill #0

## 2016-12-11 MED FILL — GLIMEPIRIDE 4 MG TABLET: 4 | 30 days supply | Qty: 60 | Fill #0

## 2016-12-17 ENCOUNTER — Other Ambulatory Visit: Payer: Self-pay | Admitting: Cardiovascular Disease

## 2016-12-17 MED FILL — METOPROLOL TARTRATE 100 MG: 100 | 30 days supply | Qty: 60 | Fill #3

## 2016-12-17 MED FILL — ATORVASTATIN 80 MG TABLET: 80 | 30 days supply | Qty: 30 | Fill #3

## 2016-12-17 MED FILL — ?METFORMIN HCL 500MG TABLET: 500 | 30 days supply | Qty: 30 | Fill #1

## 2016-12-17 MED FILL — ?ISOSORBIDE MN ER 30 MG TAB: 30 | 30 days supply | Qty: 30 | Fill #1

## 2016-12-17 MED FILL — LISINOPRIL 40 MG TABLET: 40 | 30 days supply | Qty: 30 | Fill #2

## 2016-12-17 MED FILL — ?FUROSEMIDE 20 MG TABLET: 20 | 30 days supply | Qty: 60 | Fill #0

## 2016-12-17 NOTE — Telephone Encounter (Signed)
Rx has been sent to the pharmacy electronically. ° °

## 2017-01-09 ENCOUNTER — Other Ambulatory Visit: Payer: Self-pay | Admitting: Family Medicine

## 2017-01-14 ENCOUNTER — Other Ambulatory Visit: Payer: Self-pay | Admitting: Cardiovascular Disease

## 2017-01-14 ENCOUNTER — Other Ambulatory Visit: Payer: Self-pay | Admitting: Family Medicine

## 2017-01-14 DIAGNOSIS — E1165 Type 2 diabetes mellitus with hyperglycemia: Secondary | ICD-10-CM

## 2017-01-14 NOTE — Telephone Encounter (Signed)
Rx(s) sent to pharmacy electronically.  

## 2017-01-21 ENCOUNTER — Encounter: Payer: Self-pay | Admitting: Family Medicine

## 2017-01-21 ENCOUNTER — Other Ambulatory Visit: Payer: Self-pay | Admitting: Family Medicine

## 2017-01-21 ENCOUNTER — Ambulatory Visit: Payer: Self-pay | Attending: Family Medicine | Admitting: Family Medicine

## 2017-01-21 VITALS — BP 144/83 | HR 67 | Temp 98.1°F | Ht 62.0 in | Wt 213.0 lb

## 2017-01-21 DIAGNOSIS — E785 Hyperlipidemia, unspecified: Secondary | ICD-10-CM | POA: Insufficient documentation

## 2017-01-21 DIAGNOSIS — E1165 Type 2 diabetes mellitus with hyperglycemia: Secondary | ICD-10-CM

## 2017-01-21 DIAGNOSIS — I110 Hypertensive heart disease with heart failure: Secondary | ICD-10-CM | POA: Insufficient documentation

## 2017-01-21 DIAGNOSIS — L42 Pityriasis rosea: Secondary | ICD-10-CM | POA: Insufficient documentation

## 2017-01-21 DIAGNOSIS — E1100 Type 2 diabetes mellitus with hyperosmolarity without nonketotic hyperglycemic-hyperosmolar coma (NKHHC): Secondary | ICD-10-CM

## 2017-01-21 DIAGNOSIS — Z9111 Patient's noncompliance with dietary regimen: Secondary | ICD-10-CM | POA: Insufficient documentation

## 2017-01-21 DIAGNOSIS — Z7984 Long term (current) use of oral hypoglycemic drugs: Secondary | ICD-10-CM | POA: Insufficient documentation

## 2017-01-21 DIAGNOSIS — Z9114 Patient's other noncompliance with medication regimen: Secondary | ICD-10-CM | POA: Insufficient documentation

## 2017-01-21 DIAGNOSIS — Z955 Presence of coronary angioplasty implant and graft: Secondary | ICD-10-CM | POA: Insufficient documentation

## 2017-01-21 DIAGNOSIS — I2510 Atherosclerotic heart disease of native coronary artery without angina pectoris: Secondary | ICD-10-CM | POA: Insufficient documentation

## 2017-01-21 DIAGNOSIS — K219 Gastro-esophageal reflux disease without esophagitis: Secondary | ICD-10-CM | POA: Insufficient documentation

## 2017-01-21 DIAGNOSIS — Z9861 Coronary angioplasty status: Secondary | ICD-10-CM

## 2017-01-21 DIAGNOSIS — Z6836 Body mass index (BMI) 36.0-36.9, adult: Secondary | ICD-10-CM | POA: Insufficient documentation

## 2017-01-21 DIAGNOSIS — I252 Old myocardial infarction: Secondary | ICD-10-CM | POA: Insufficient documentation

## 2017-01-21 DIAGNOSIS — Z79899 Other long term (current) drug therapy: Secondary | ICD-10-CM | POA: Insufficient documentation

## 2017-01-21 DIAGNOSIS — E669 Obesity, unspecified: Secondary | ICD-10-CM | POA: Insufficient documentation

## 2017-01-21 DIAGNOSIS — Z7982 Long term (current) use of aspirin: Secondary | ICD-10-CM | POA: Insufficient documentation

## 2017-01-21 DIAGNOSIS — I5032 Chronic diastolic (congestive) heart failure: Secondary | ICD-10-CM | POA: Insufficient documentation

## 2017-01-21 LAB — GLUCOSE, POCT (MANUAL RESULT ENTRY): POC Glucose: 303 mg/dl — AB (ref 70–99)

## 2017-01-21 LAB — POCT GLYCOSYLATED HEMOGLOBIN (HGB A1C): Hemoglobin A1C: 9.6

## 2017-01-21 MED ORDER — METFORMIN HCL 500 MG PO TABS: 500.0000 mg | ORAL_TABLET | Freq: Every day | ORAL | 3 refills | Status: DC

## 2017-01-21 MED ORDER — GLIMEPIRIDE 4 MG PO TABS: ORAL_TABLET | ORAL | 3 refills | Status: DC

## 2017-01-21 MED FILL — INVOKANA 100 MG TABLET: 100 | 30 days supply | Qty: 30 | Fill #0

## 2017-01-21 MED FILL — ?METFORMIN HCL 500MG TABLET: 500 | 30 days supply | Qty: 30 | Fill #0

## 2017-01-21 MED FILL — ?LISINOPRIL 40 MG TABLET: 40 MG | 30 days supply | Qty: 30 | Fill #3

## 2017-01-21 MED FILL — GLIMEPIRIDE 4 MG TABLET: 4 | 30 days supply | Qty: 60 | Fill #0

## 2017-01-21 MED FILL — METOPROLOL TARTRATE 100 MG: 100 | 30 days supply | Qty: 60 | Fill #4

## 2017-01-21 MED FILL — FUROSEMIDE 20 MG TABLET: 20 | 30 days supply | Qty: 30 | Fill #0

## 2017-01-21 MED FILL — ?ISOSORBIDE MN ER 30 MG TAB: 30 | 30 days supply | Qty: 30 | Fill #2

## 2017-01-21 MED FILL — ATORVASTATIN 80 MG TABLET: 80 | 30 days supply | Qty: 30 | Fill #4

## 2017-01-21 NOTE — Progress Notes (Signed)
Subjective:    Patient ID: [name], female    DOB: 1956/02/15, 61 y.o.   MRN: [id number]  HPI Yatzel Venier is a 61 year old female with a history of uncontrolled type 2 diabetes mellitus (A1c 9.6), dyslipidemia, coronary artery disease status post DES stent (in 08/2015, completed 12 months of dual antiplatelet therapy with Brilinta and ASA ), essential hypertension who comes in today for a follow-up visit.  She endorses noncompliance with a diabetic diet and exercise and has been out of her medications for 1 week. She also endorses overindulging during the holidays.  She complains of a nonpruritic rash which she has on her trunk and extremities for the last 1 week and denies fever or; no other members of her household have the rash.  Denies history of allergies to foods or drinks.   Past Medical History:  Diagnosis Date  . Anginal pain ([place])   . Coronary atherosclerosis of native coronary artery, with stent to LCX in 2006 06/04/2013  . Diabetes mellitus without complication ([place])   . GERD (gastroesophageal reflux disease)   . Heart attack   . Hyperlipidemia LDL goal < 70 06/07/2013  . Hypertension   . S/P coronary artery stent placement to RCA with PROMUS DES, 06/07/13, residual disease on OM1, OM2 and rPDA 06/07/2013    Past Surgical History:  Procedure Laterality Date  . CARDIAC CATHETERIZATION N/A 08/22/2015   Procedure: Left Heart Cath and Coronary Angiography;  Surgeon: Leonie Man, MD;  Location: [facility];  Service: Cardiovascular;  Laterality: N/A;  . CARDIAC CATHETERIZATION N/A 08/22/2015   Procedure: Coronary Stent Intervention;  Surgeon: Leonie Man, MD;  Location: [facility];  Service: Cardiovascular;  Laterality: N/A;  . CARDIAC CATHETERIZATION N/A 08/22/2015   Procedure: Left Heart Cath and Coronary Angiography;  Surgeon: Leonie Man, MD;  Location: [facility];  Service: Cardiovascular;  Laterality: N/A;  . CORONARY STENT PLACEMENT   2006   TAXUS stent [facility]X in [facility]  . LEFT HEART CATHETERIZATION WITH CORONARY ANGIOGRAM N/A 06/07/2013   Procedure: LEFT HEART CATHETERIZATION WITH CORONARY ANGIOGRAM;  Surgeon: Sanda Klein, MD;  Location: [facility];  Service: Cardiovascular;  Laterality: N/A;    No Known Allergies  Current Outpatient Prescriptions on File Prior to Visit  Medication Sig Dispense Refill  . acetaminophen (TYLENOL) 500 MG tablet Take 1,000 mg by mouth daily as needed for moderate pain.     Marland Kitchen aspirin EC 81 MG EC tablet Take 1 tablet (81 mg total) by mouth daily.    Marland Kitchen atorvastatin (LIPITOR) 80 MG tablet TAKE 1 TABLET BY MOUTH DAILY AT 6 PM 30 tablet 10  . furosemide (LASIX) 20 MG tablet Take 2 tablets (40 mg total) by mouth daily. PLEASE CONTACT OFFICE FOR ADDITIONAL REFILLS 2ND ATTEMPT 30 tablet 0  . INVOKANA 100 MG TABS tablet TAKE 1 TABLET BY MOUTH DAILY 90 tablet 0  . isosorbide mononitrate (IMDUR) 30 MG 24 hr tablet Take 1 tablet (30 mg total) by mouth daily. 30 tablet 6  . lisinopril (PRINIVIL,ZESTRIL) 40 MG tablet Take 1 tablet (40 mg total) by mouth daily. 90 tablet 1  . metoprolol (LOPRESSOR) 100 MG tablet TAKE 1 TABLET BY MOUTH TWICE DAILY. 60 tablet 10  . nitroGLYCERIN (NITROSTAT) 0.4 MG SL tablet Place 1 tablet (0.4 mg total) under the tongue every 5 (five) minutes as needed for chest pain. 25 tablet 2  . ranitidine (ZANTAC) 150 MG capsule Take 150 mg  by mouth daily as needed for heartburn.     No current facility-administered medications on file prior to visit.      Review of Systems  Constitutional: Negative for activity change, appetite change and fatigue.  HENT: Negative for congestion, sinus pressure and sore throat.   Eyes: Negative for visual disturbance.  Respiratory: Negative for cough, chest tightness, shortness of breath and wheezing.   Cardiovascular: Negative for chest pain and palpitations.  Gastrointestinal: Negative for abdominal distention, abdominal pain and constipation.   Endocrine: Negative for polydipsia.  Genitourinary: Negative for dysuria and frequency.  Musculoskeletal: Negative for arthralgias and back pain.  Skin: Positive for rash.  Neurological: Negative for tremors, light-headedness and numbness.  Hematological: Does not bruise/bleed easily.  Psychiatric/Behavioral: Negative for agitation and behavioral problems.       Objective: Vitals:   01/21/17 1529  BP: (!) 144/83  Pulse: 67  Temp: 98.1 F (36.7 C)  TempSrc: Oral  SpO2: 98%  Weight: 213 lb (96.6 kg)  Height: 5\' 2"  (1.575 m)      Physical Exam  Constitutional: She is oriented to person, place, and time. She appears well-developed and well-nourished.  Cardiovascular: Normal rate, normal heart sounds and intact distal pulses.   No murmur heard. Pulmonary/Chest: Effort normal and breath sounds normal. She has no wheezes. She has no rales. She exhibits no tenderness.  Abdominal: Soft. Bowel sounds are normal. She exhibits no distension and no mass. There is no tenderness.  Musculoskeletal: Normal range of motion.  Neurological: She is alert and oriented to person, place, and time.  Skin:  Papular rash with christmas tree appearance on trunk anteriorly and posteriorly          Lab Results  Component Value Date   HGBA1C 9.6 01/21/2017    Assessment & Plan:  1. Type 2 diabetes mellitus with hyperglycemia, without long-term current use of insulin (HCC) A1c 9.6 which is up from 9.4 previously Due to noncompliance Emphasized compliance, weight loss, diabetic diet Increase metformin to 500 mg daily Continue Invokana, glimepiride, metformin Patient is resistant to initiation of insulin ADA diet, lifestyle modification Foot exam performed today. Advised to schedule annual eye exam with optometrist or ophthalmologist-educated about community resources for eye exam as she has no medical coverage.  2. Essential hypertension Controlled - COMPLETE METABOLIC PANEL WITH GFR  3.  CAD -S/P RCA DES 08/22/15 Stable- completed dual antiplatelet therapy with Brilinta and ASA Now taking ASA Followed by [facility]  4. Dyslipidemia Controlled on statin Lipid panel ordered  5. Chronic diastolic heart failure ([place]) Euvolemic She does have some shortness of breath on climbing the stairs-NYHA 2 Gained 7 pounds since last office visit  Currently on beta blocker, ACE inhibitor, Lasix Daily weight checks, low-sodium.  6. Obesity- BMI 36 Advised  to commence an exercise regimen, reduce portion sizes.  7. Pityriasis Rosea Patient has no pruritus Advised to use OTC Hydrocortisone if she develops pruritus  HCM - referred for colonoscopy and patient saw GI but never followed through with colonoscopy due to fear. She was referred for mammogram previously which she hasn't done and we have provided the number for her to schedule an appointment.

## 2017-01-21 NOTE — Patient Instructions (Signed)
Pityriasis Rosea Introduction Pityriasis rosea is a rash that usually appears on the trunk of the body. It may also appear on the upper arms and upper legs. It usually begins as a single patch, and then more patches begin to develop. The rash may cause mild itching, but it normally does not cause other problems. It usually goes away without treatment. However, it may take weeks or months for the rash to go away completely. What are the causes? The cause of this condition is not known. The condition does not spread from person to person (is noncontagious). What increases the risk? This condition is more likely to develop in young adults and children. It is most common in the spring and fall. What are the signs or symptoms? The main symptom of this condition is a rash.  The rash usually begins with a single oval patch that is larger than the ones that follow. This is called a herald patch. It generally appears a week or more before the rest of the rash appears.  When more patches start to develop, they spread quickly on the trunk, back, and arms. These patches are smaller than the first one.  The patches that make up the rash are usually oval-shaped and pink or red in color. They are usually flat, but they may sometimes be raised so that they can be felt with a finger. They may also be finely crinkled and have a scaly ring around the edge.  The rash does not typically appear on areas of the skin that are exposed to the sun. Most people who have this condition do not have other symptoms, but some have mild itching. In a few cases, a mild headache or body aches may occur before the rash appears and then go away. How is this diagnosed? Your health care provider may diagnose this condition by doing a physical exam and taking your medical history. To rule out other possible causes for the rash, the health care provider may order blood tests or take a skin sample from the rash to be looked at under a  microscope. How is this treated? Usually, treatment is not needed for this condition. The rash will probably go away on its own in 4-8 weeks. In some cases, a health care provider may recommend or prescribe medicine to reduce itching. Follow these instructions at home:  Take medicines only as directed by your health care provider.  Avoid scratching the affected areas of skin.  Do not take hot baths or use a sauna. Use only warm water when bathing or showering. Heat can increase itching. Contact a health care provider if:  Your rash does not go away in 8 weeks.  Your rash gets much worse.  You have a fever.  You have swelling or pain in the rash area.  You have fluid, blood, or pus coming from the rash area. This information is not intended to replace advice given to you by your health care provider. Make sure you discuss any questions you have with your health care provider. Document Released: 01/08/2002 Document Revised: 05/09/2016 Document Reviewed: 11/09/2014  2017 [employer]vier

## 2017-01-27 NOTE — Telephone Encounter (Signed)
Pt. Called stating that her PCP wanted her to start taking metformin twice a day and the Rx is only for once a day. Please f/u with pt.

## 2017-01-28 MED ORDER — METFORMIN HCL 500 MG PO TABS: 500.0000 mg | ORAL_TABLET | Freq: Two times a day (BID) | ORAL | 3 refills | Status: DC

## 2017-01-28 NOTE — Telephone Encounter (Signed)
Done

## 2017-01-31 ENCOUNTER — Telehealth: Payer: Self-pay | Admitting: Family Medicine

## 2017-01-31 NOTE — Telephone Encounter (Signed)
Pt verified, Pt aware to take medication twice daily.

## 2017-01-31 NOTE — Telephone Encounter (Signed)
She should be taking metformin 500mg  twice daily and does have a prescription at the pharmacy to this effect. So if she has 500 mg of metformin on hand she should take that twice daily.

## 2017-01-31 NOTE — Telephone Encounter (Signed)
Patient called the office to speak with PCP in regards to medication metformin. On the day of the appointment patient was give a different instruction by provider to take medication twice a day. When patient picked up medication from pharmacy the instructions stated that patient has to take medication once a day. Pt is confused and upset. Please follow up.  Thank you.

## 2017-02-06 MED FILL — ?METFORMIN HCL 500MG TABLET: 500 | 30 days supply | Qty: 60 | Fill #0

## 2017-02-06 NOTE — Telephone Encounter (Signed)
Metformin set to pharmacy with new dose.  Patient aware and will pick up meds today- she is out of the metformin.

## 2017-02-11 ENCOUNTER — Encounter: Payer: Self-pay | Admitting: Family Medicine

## 2017-02-11 ENCOUNTER — Ambulatory Visit: Payer: Self-pay | Attending: Family Medicine | Admitting: Family Medicine

## 2017-02-11 VITALS — BP 110/70 | HR 66 | Temp 97.7°F | Ht 62.0 in | Wt 207.2 lb

## 2017-02-11 DIAGNOSIS — E1165 Type 2 diabetes mellitus with hyperglycemia: Secondary | ICD-10-CM

## 2017-02-11 DIAGNOSIS — I10 Essential (primary) hypertension: Secondary | ICD-10-CM

## 2017-02-11 DIAGNOSIS — Z7982 Long term (current) use of aspirin: Secondary | ICD-10-CM | POA: Insufficient documentation

## 2017-02-11 DIAGNOSIS — I2510 Atherosclerotic heart disease of native coronary artery without angina pectoris: Secondary | ICD-10-CM | POA: Insufficient documentation

## 2017-02-11 DIAGNOSIS — E6609 Other obesity due to excess calories: Secondary | ICD-10-CM | POA: Insufficient documentation

## 2017-02-11 DIAGNOSIS — E785 Hyperlipidemia, unspecified: Secondary | ICD-10-CM | POA: Insufficient documentation

## 2017-02-11 DIAGNOSIS — K219 Gastro-esophageal reflux disease without esophagitis: Secondary | ICD-10-CM | POA: Insufficient documentation

## 2017-02-11 DIAGNOSIS — Z7984 Long term (current) use of oral hypoglycemic drugs: Secondary | ICD-10-CM | POA: Insufficient documentation

## 2017-02-11 DIAGNOSIS — Z6836 Body mass index (BMI) 36.0-36.9, adult: Secondary | ICD-10-CM | POA: Insufficient documentation

## 2017-02-11 DIAGNOSIS — Z955 Presence of coronary angioplasty implant and graft: Secondary | ICD-10-CM | POA: Insufficient documentation

## 2017-02-11 DIAGNOSIS — E66812 Obesity, class 2: Secondary | ICD-10-CM

## 2017-02-11 DIAGNOSIS — Z79899 Other long term (current) drug therapy: Secondary | ICD-10-CM | POA: Insufficient documentation

## 2017-02-11 DIAGNOSIS — I252 Old myocardial infarction: Secondary | ICD-10-CM | POA: Insufficient documentation

## 2017-02-11 LAB — LIPID PANEL
CHOL/HDL RATIO: 2.4 ratio (ref <5.0)
Cholesterol: 110 mg/dL (ref <200)
HDL: 46 mg/dL — ABNORMAL LOW (ref >50)
LDL CALC: 46 mg/dL (ref <100)
Triglycerides: 90 mg/dL (ref <150)
VLDL: 18 mg/dL (ref <30)

## 2017-02-11 LAB — COMPLETE METABOLIC PANEL WITH GFR
ALT: 13 U/L (ref 6–29)
AST: 14 U/L (ref 10–35)
Albumin: 4 g/dL (ref 3.6–5.1)
Alkaline Phosphatase: 98 U/L (ref 33–130)
BUN: 16 mg/dL (ref 7–25)
CALCIUM: 9.6 mg/dL (ref 8.6–10.4)
CHLORIDE: 107 mmol/L (ref 98–110)
CO2: 27 mmol/L (ref 20–31)
CREATININE: 0.79 mg/dL (ref 0.50–1.05)
GFR, Est African American: >89 mL/min (ref >=60)
GFR, Est Non African American: 83 mL/min (ref >=60)
GLUCOSE: 158 mg/dL — AB (ref 65–99)
Potassium: 3.6 mmol/L (ref 3.5–5.3)
SODIUM: 143 mmol/L (ref 135–146)
Total Bilirubin: 0.6 mg/dL (ref 0.2–1.2)
Total Protein: 7 g/dL (ref 6.1–8.1)

## 2017-02-11 LAB — GLUCOSE, POCT (MANUAL RESULT ENTRY): POC GLUCOSE: 172 mg/dL — AB (ref 70–99)

## 2017-02-11 MED ORDER — METFORMIN HCL 500 MG PO TABS: 1000.0000 mg | ORAL_TABLET | Freq: Two times a day (BID) | ORAL | 3 refills | Status: DC

## 2017-02-11 NOTE — Progress Notes (Signed)
Subjective:  Patient ID: [name], female    DOB: [birth year]  Age: 61 y.o. MRN: [id number]  CC: Diabetes and Hypertension   HPI [name] is a 61 year old female with a history of uncontrolled type 2 diabetes mellitus (A1c 9.6), dyslipidemia, coronary artery disease status post DES stent (in 08/2015, completed 12 months of dual antiplatelet therapy with Brilinta and ASA ), essential hypertension who comes in today for a follow-up of retention, Diabetes Mellitus and Pityriasis rosea.  Her metformin had been increased from 500 mg daily to 500 mg twice daily and for the rash we had discussed observation as she was symptomatic and this is self-limiting. She has been compliant with her antihypertensive.  She reports resolution of her pityriasis rosea rash and her blood pressure is controlled today. Blood sugar log reveals fasting sugars in the 150s and she has remained on metformin 500 mg twice daily.  Past Medical History:  Diagnosis Date  . Anginal pain ([place])   . Coronary atherosclerosis of native coronary artery, with stent to LCX in 2006 06/04/2013  . Diabetes mellitus without complication ([place])   . GERD (gastroesophageal reflux disease)   . Heart attack   . Hyperlipidemia LDL goal < 70 06/07/2013  . Hypertension   . S/P coronary artery stent placement to RCA with PROMUS DES, 06/07/13, residual disease on OM1, OM2 and rPDA 06/07/2013    Past Surgical History:  Procedure Laterality Date  . CARDIAC CATHETERIZATION N/A 08/22/2015   Procedure: Left Heart Cath and Coronary Angiography;  Surgeon: Leonie Man, MD;  Location: [facility];  Service: Cardiovascular;  Laterality: N/A;  . CARDIAC CATHETERIZATION N/A 08/22/2015   Procedure: Coronary Stent Intervention;  Surgeon: Leonie Man, MD;  Location: [facility];  Service: Cardiovascular;  Laterality: N/A;  . CARDIAC CATHETERIZATION N/A 08/22/2015   Procedure: Left Heart Cath and Coronary Angiography;  Surgeon: Leonie Man, MD;  Location: [facility];  Service: Cardiovascular;  Laterality: N/A;  . CORONARY STENT PLACEMENT  2006   TAXUS stent CFX in [facility]  . LEFT HEART CATHETERIZATION WITH CORONARY ANGIOGRAM N/A 06/07/2013   Procedure: LEFT HEART CATHETERIZATION WITH CORONARY ANGIOGRAM;  Surgeon: Sanda Klein, MD;  Location: [facility];  Service: Cardiovascular;  Laterality: N/A;    No Known Allergies   Outpatient Medications Prior to Visit  Medication Sig Dispense Refill  . acetaminophen (TYLENOL) 500 MG tablet Take 1,000 mg by mouth daily as needed for moderate pain.     Marland Kitchen aspirin EC 81 MG EC tablet Take 1 tablet (81 mg total) by mouth daily.    Marland Kitchen atorvastatin (LIPITOR) 80 MG tablet TAKE 1 TABLET BY MOUTH DAILY AT 6 PM 30 tablet 10  . furosemide (LASIX) 20 MG tablet Take 2 tablets (40 mg total) by mouth daily. PLEASE CONTACT OFFICE FOR ADDITIONAL REFILLS 2ND ATTEMPT 30 tablet 0  . glimepiride (AMARYL) 4 MG tablet TAKE 1 TABLET BY MOUTH 2 TIMES DAILY WITH A MEAL. 60 tablet 3  . INVOKANA 100 MG TABS tablet TAKE 1 TABLET BY MOUTH DAILY 90 tablet 0  . isosorbide mononitrate (IMDUR) 30 MG 24 hr tablet Take 1 tablet (30 mg total) by mouth daily. 30 tablet 6  . lisinopril (PRINIVIL,ZESTRIL) 40 MG tablet Take 1 tablet (40 mg total) by mouth daily. 90 tablet 1  . metoprolol (LOPRESSOR) 100 MG tablet TAKE 1 TABLET BY MOUTH TWICE DAILY. 60 tablet 10  . ranitidine (ZANTAC) 150 MG  capsule Take 150 mg by mouth daily as needed for heartburn.    . metFORMIN (GLUCOPHAGE) 500 MG tablet Take 1 tablet (500 mg total) by mouth 2 (two) times daily with a meal. 60 tablet 3  . nitroGLYCERIN (NITROSTAT) 0.4 MG SL tablet Place 1 tablet (0.4 mg total) under the tongue every 5 (five) minutes as needed for chest pain. (Patient not taking: Reported on 02/11/2017) 25 tablet 2   No facility-administered medications prior to visit.     ROS Review of Systems  Constitutional: Negative for activity change, appetite  change and fatigue.  HENT: Negative for congestion, sinus pressure and sore throat.   Eyes: Negative for visual disturbance.  Respiratory: Negative for cough, chest tightness, shortness of breath and wheezing.   Cardiovascular: Negative for chest pain and palpitations.  Gastrointestinal: Negative for abdominal distention, abdominal pain and constipation.  Endocrine: Negative for polydipsia.  Genitourinary: Negative for dysuria and frequency.  Musculoskeletal: Negative for arthralgias and back pain.  Skin: Negative for rash.  Neurological: Negative for tremors, light-headedness and numbness.  Hematological: Does not bruise/bleed easily.  Psychiatric/Behavioral: Negative for agitation and behavioral problems.    Objective:  BP 110/70 (BP Location: Right Arm, Patient Position: Sitting, Cuff Size: Large)   Pulse 66   Temp 97.7 F (36.5 C) (Oral)   Ht 5\' 2"  (1.575 m)   Wt 207 lb 3.2 oz (94 kg)   BMI 37.90 kg/m   BP/Weight 02/11/2017 01/21/2017 99991111  Systolic BP [id number]  Diastolic BP 70 83 80  Wt. (Lbs) 207.2 213 203.38  BMI 37.9 38.96 35.74      Physical Exam  Constitutional: She is oriented to person, place, and time. She appears well-developed and well-nourished.  Cardiovascular: Normal rate, normal heart sounds and intact distal pulses.   No murmur heard. Pulmonary/Chest: Effort normal and breath sounds normal. She has no wheezes. She has no rales. She exhibits no tenderness.  Abdominal: Soft. Bowel sounds are normal. She exhibits no distension and no mass. There is no tenderness.  Musculoskeletal: Normal range of motion.  Neurological: She is alert and oriented to person, place, and time.  Skin: Skin is warm and dry.  Psychiatric: She has a normal mood and affect.     Lab Results  Component Value Date   HGBA1C 9.6 01/21/2017    CMP Latest Ref Rng & Units 05/06/2016 02/02/2016 08/27/2015  Glucose 65 - 99 mg/dL 104(H) 231(H) 255(H)  BUN 7 - 25 mg/dL 17 20 16     Creatinine 0.50 - 1.05 mg/dL 0.80 0.88 0.81  Sodium 135 - 146 mmol/L 142 140 139  Potassium 3.5 - 5.3 mmol/L 3.5 4.1 4.1  Chloride 98 - 110 mmol/L 105 104 107  CO2 20 - 31 mmol/L 26 28 24   Calcium 8.6 - 10.4 mg/dL 9.6 9.2 9.3  Total Protein 6.1 - 8.1 g/dL 6.5 6.9 -  Total Bilirubin 0.2 - 1.2 mg/dL 0.6 0.4 -  Alkaline Phos 33 - 130 U/L 76 109 -  AST 10 - 35 U/L 13 12 -  ALT 6 - 29 U/L 14 13 -    Lipid Panel     Component Value Date/Time   CHOL 112 (L) 05/06/2016 0838   TRIG 76 05/06/2016 0838   HDL 48 05/06/2016 0838   CHOLHDL 2.3 05/06/2016 0838   VLDL 15 05/06/2016 0838   LDLCALC 49 05/06/2016 0838    Assessment & Plan:   1. Type 2 diabetes mellitus with hyperglycemia, without long-term current use  of insulin ([place]) Controlled with A1c of 9.6 Blood sugar logs reveals slight improvement Increased dose of metformin to 1000 mg twice daily She has been advised to reduce back to 500 mg twice daily in the event of GI intolerance which she has had in the past. Continue Invokana and glimepiride. - Glucose (CBG) - metFORMIN (GLUCOPHAGE) 500 MG tablet; Take 2 tablets (1,000 mg total) by mouth 2 (two) times daily with a meal.  Dispense: 120 tablet; Refill: 3 - COMPLETE METABOLIC PANEL WITH GFR - Lipid panel - Microalbumin / creatinine urine ratio  2. Class 2 obesity due to excess calories without serious comorbidity with body mass index (BMI) of 36.0 to 36.9 in adult Cost increasing physical activity, reduce portion sizes We have a weight loss goal of 1-2 pounds per week She does have a treadmill and will work on going on frequently  3. Essential hypertension Controlled Low-sodium diet Lifestyle modifications including weight loss Continue to have hypertensive   Meds ordered this encounter  Medications  . metFORMIN (GLUCOPHAGE) 500 MG tablet    Sig: Take 2 tablets (1,000 mg total) by mouth 2 (two) times daily with a meal.    Dispense:  120 tablet    Refill:  3     Discontinue previous dose    Follow-up: Return in about 3 months (around 05/11/2017) for Follow-up on diabetes mellitus.   Arnoldo Morale MD

## 2017-02-18 ENCOUNTER — Other Ambulatory Visit: Payer: Self-pay | Admitting: Cardiovascular Disease

## 2017-02-18 MED FILL — GLIMEPIRIDE 4 MG TABLET: 4 | 30 days supply | Qty: 60 | Fill #1

## 2017-02-18 MED FILL — ?FUROSEMIDE 20 MG TABLET: 20 | 30 days supply | Qty: 60 | Fill #0

## 2017-02-18 MED FILL — ISOSORBIDE MN ER 30 MG TAB: 30 | 30 days supply | Qty: 30 | Fill #3

## 2017-02-18 MED FILL — INVOKANA 100 MG TABLET: 100 | 30 days supply | Qty: 30 | Fill #1

## 2017-02-18 MED FILL — ?LISINOPRIL 40 MG TABLET: 40 MG | 30 days supply | Qty: 30 | Fill #4

## 2017-02-18 MED FILL — ATORVASTATIN 80 MG TABLET: 80 | 30 days supply | Qty: 30 | Fill #5

## 2017-02-18 MED FILL — METOPROLOL TARTRATE 100 MG: 100 | 30 days supply | Qty: 60 | Fill #5

## 2017-03-03 MED FILL — ?METFORMIN HCL 500MG TABLET: 500 | 30 days supply | Qty: 60 | Fill #1

## 2017-03-24 ENCOUNTER — Other Ambulatory Visit: Payer: Self-pay | Admitting: Cardiovascular Disease

## 2017-03-24 MED FILL — ATORVASTATIN 80 MG TABLET: 80 | 30 days supply | Qty: 30 | Fill #6

## 2017-03-24 MED FILL — ?ISOSORBIDE MN ER 30 MG TAB: 30 | 30 days supply | Qty: 30 | Fill #4

## 2017-03-24 MED FILL — ?METOPROLOL 100 MG TABLET: 100 | 30 days supply | Qty: 60 | Fill #6

## 2017-03-24 MED FILL — GLIMEPIRIDE 4 MG TABLET: 4 | 30 days supply | Qty: 60 | Fill #2

## 2017-03-24 MED FILL — metFORMIN HCL 500 MG TABS: 500 | 30 days supply | Qty: 60 | Fill #2

## 2017-03-24 MED FILL — LISINOPRIL 40 MG TABLET: 40 | 30 days supply | Qty: 30 | Fill #5

## 2017-03-25 MED FILL — ?FUROSEMIDE 20 MG TABLET: 20 | 30 days supply | Qty: 60 | Fill #0

## 2017-03-26 MED FILL — INVOKANA 100 MG TABLET: 100 | 30 days supply | Qty: 30 | Fill #2

## 2017-04-21 ENCOUNTER — Other Ambulatory Visit: Payer: Self-pay | Admitting: Cardiovascular Disease

## 2017-04-21 ENCOUNTER — Other Ambulatory Visit: Payer: Self-pay | Admitting: Family Medicine

## 2017-04-21 MED FILL — ATORVASTATIN 80 MG TABLET: 80 | 30 days supply | Qty: 30 | Fill #7

## 2017-04-21 MED FILL — ?ISOSORBIDE MN ER 30 MG TAB: 30 | 30 days supply | Qty: 30 | Fill #5

## 2017-04-21 MED FILL — METOPROLOL TARTRATE 100 MG: 100 | 30 days supply | Qty: 60 | Fill #7

## 2017-04-21 MED FILL — ?METFORMIN HCL 500MG TABLET: 500 | 30 days supply | Qty: 60 | Fill #3

## 2017-04-21 MED FILL — INVOKANA 100 MG TABLET: 100 | 30 days supply | Qty: 30 | Fill #0

## 2017-04-21 MED FILL — GLIMEPIRIDE 4 MG TABLET: 4 | 30 days supply | Qty: 60 | Fill #3

## 2017-04-23 MED FILL — FUROSEMIDE 20 MG TABLET: 20 | 30 days supply | Qty: 60 | Fill #0

## 2017-04-23 MED FILL — LISINOPRIL 40 MG TABLET: 40 | 90 days supply | Qty: 90 | Fill #0

## 2017-05-14 ENCOUNTER — Ambulatory Visit: Payer: Self-pay | Attending: Family Medicine | Admitting: Family Medicine

## 2017-05-14 ENCOUNTER — Encounter: Payer: Self-pay | Admitting: Family Medicine

## 2017-05-14 VITALS — BP 124/77 | HR 67 | Temp 97.7°F | Wt 199.8 lb

## 2017-05-14 DIAGNOSIS — Z955 Presence of coronary angioplasty implant and graft: Secondary | ICD-10-CM | POA: Insufficient documentation

## 2017-05-14 DIAGNOSIS — Z6836 Body mass index (BMI) 36.0-36.9, adult: Secondary | ICD-10-CM

## 2017-05-14 DIAGNOSIS — Z9861 Coronary angioplasty status: Secondary | ICD-10-CM

## 2017-05-14 DIAGNOSIS — Z7982 Long term (current) use of aspirin: Secondary | ICD-10-CM | POA: Insufficient documentation

## 2017-05-14 DIAGNOSIS — I252 Old myocardial infarction: Secondary | ICD-10-CM | POA: Insufficient documentation

## 2017-05-14 DIAGNOSIS — E6609 Other obesity due to excess calories: Secondary | ICD-10-CM

## 2017-05-14 DIAGNOSIS — Z7984 Long term (current) use of oral hypoglycemic drugs: Secondary | ICD-10-CM | POA: Insufficient documentation

## 2017-05-14 DIAGNOSIS — Z79899 Other long term (current) drug therapy: Secondary | ICD-10-CM | POA: Insufficient documentation

## 2017-05-14 DIAGNOSIS — E1165 Type 2 diabetes mellitus with hyperglycemia: Secondary | ICD-10-CM | POA: Insufficient documentation

## 2017-05-14 DIAGNOSIS — E785 Hyperlipidemia, unspecified: Secondary | ICD-10-CM | POA: Insufficient documentation

## 2017-05-14 DIAGNOSIS — Z9889 Other specified postprocedural states: Secondary | ICD-10-CM | POA: Insufficient documentation

## 2017-05-14 DIAGNOSIS — I5032 Chronic diastolic (congestive) heart failure: Secondary | ICD-10-CM | POA: Insufficient documentation

## 2017-05-14 DIAGNOSIS — I10 Essential (primary) hypertension: Secondary | ICD-10-CM

## 2017-05-14 DIAGNOSIS — E1100 Type 2 diabetes mellitus with hyperosmolarity without nonketotic hyperglycemic-hyperosmolar coma (NKHHC): Secondary | ICD-10-CM

## 2017-05-14 DIAGNOSIS — K219 Gastro-esophageal reflux disease without esophagitis: Secondary | ICD-10-CM | POA: Insufficient documentation

## 2017-05-14 DIAGNOSIS — I2510 Atherosclerotic heart disease of native coronary artery without angina pectoris: Secondary | ICD-10-CM | POA: Insufficient documentation

## 2017-05-14 DIAGNOSIS — I110 Hypertensive heart disease with heart failure: Secondary | ICD-10-CM | POA: Insufficient documentation

## 2017-05-14 LAB — GLUCOSE, POCT (MANUAL RESULT ENTRY): POC Glucose: 145 mg/dl — AB (ref 70–99)

## 2017-05-14 LAB — POCT GLYCOSYLATED HEMOGLOBIN (HGB A1C): HEMOGLOBIN A1C: 7.8

## 2017-05-14 MED ORDER — ATORVASTATIN CALCIUM 80 MG PO TABS: ORAL_TABLET | ORAL | 6 refills | Status: DC

## 2017-05-14 MED ORDER — FUROSEMIDE 20 MG PO TABS: 40.0000 mg | ORAL_TABLET | Freq: Every day | ORAL | 6 refills | Status: DC

## 2017-05-14 MED ORDER — METFORMIN HCL 500 MG PO TABS: 1000.0000 mg | ORAL_TABLET | Freq: Two times a day (BID) | ORAL | 6 refills | Status: DC

## 2017-05-14 MED ORDER — METOPROLOL TARTRATE 100 MG PO TABS: 100.0000 mg | ORAL_TABLET | Freq: Two times a day (BID) | ORAL | 6 refills | Status: DC

## 2017-05-14 MED ORDER — GLIMEPIRIDE 4 MG PO TABS: ORAL_TABLET | ORAL | 6 refills | Status: DC

## 2017-05-14 MED ORDER — ISOSORBIDE MONONITRATE ER 30 MG PO TB24: 30.0000 mg | ORAL_TABLET | Freq: Every day | ORAL | 6 refills | Status: DC

## 2017-05-14 MED ORDER — CANAGLIFLOZIN 100 MG PO TABS: 100.0000 mg | ORAL_TABLET | Freq: Every day | ORAL | 6 refills | Status: DC

## 2017-05-14 MED ORDER — LISINOPRIL 40 MG PO TABS: 40.0000 mg | ORAL_TABLET | Freq: Every day | ORAL | 6 refills | Status: DC

## 2017-05-14 MED FILL — ?METOPROLOL 100 MG TABLET: 100 | 30 days supply | Qty: 60 | Fill #0

## 2017-05-14 MED FILL — ISOSORBIDE MN ER 30 MG TAB: 30 | 30 days supply | Qty: 30 | Fill #0

## 2017-05-14 MED FILL — ?GLIMEPIRIDE 4 MG TABLET: 4 | 30 days supply | Qty: 60 | Fill #0

## 2017-05-14 MED FILL — metFORMIN HCL 500 MG TABS: 500 | 30 days supply | Qty: 120 | Fill #0

## 2017-05-14 MED FILL — ATORVASTATIN 80 MG TABLET: 80 | 30 days supply | Qty: 30 | Fill #0

## 2017-05-14 MED FILL — FUROSEMIDE 20 MG TABLET: 20 | 30 days supply | Qty: 60 | Fill #0

## 2017-05-14 MED FILL — INVOKANA 100 MG TABLET: 100 | 30 days supply | Qty: 30 | Fill #0

## 2017-05-14 NOTE — Patient Instructions (Signed)
Diabetes Mellitus and Exercise Exercising regularly is important for your overall health, especially when you have diabetes (diabetes mellitus). Exercising is not only about losing weight. It has many health benefits, such as increasing muscle strength and bone density and reducing body fat and stress. This leads to improved fitness, flexibility, and endurance, all of which result in better overall health. Exercise has additional benefits for people with diabetes, including:  Reducing appetite.  Helping to lower and control blood glucose.  Lowering blood pressure.  Helping to control amounts of fatty substances (lipids) in the blood, such as cholesterol and triglycerides.  Helping the body to respond better to insulin (improving insulin sensitivity).  Reducing how much insulin the body needs.  Decreasing the risk for heart disease by:  Lowering cholesterol and triglyceride levels.  Increasing the levels of good cholesterol.  Lowering blood glucose levels. What is my activity plan? Your health care provider or certified diabetes educator can help you make a plan for the type and frequency of exercise (activity plan) that works for you. Make sure that you:  Do at least 150 minutes of moderate-intensity or vigorous-intensity exercise each week. This could be brisk walking, biking, or water aerobics.  Do stretching and strength exercises, such as yoga or weightlifting, at least 2 times a week.  Spread out your activity over at least 3 days of the week.  Get some form of physical activity every day.  Do not go more than 2 days in a row without some kind of physical activity.  Avoid being inactive for more than 90 minutes at a time. Take frequent breaks to walk or stretch.  Choose a type of exercise or activity that you enjoy, and set realistic goals.  Start slowly, and gradually increase the intensity of your exercise over time. What do I need to know about managing my  diabetes?  Check your blood glucose before and after exercising.  If your blood glucose is higher than 240 mg/dL (13.3 mmol/L) before you exercise, check your urine for ketones. If you have ketones in your urine, do not exercise until your blood glucose returns to normal.  Know the symptoms of low blood glucose (hypoglycemia) and how to treat it. Your risk for hypoglycemia increases during and after exercise. Common symptoms of hypoglycemia can include:  Hunger.  Anxiety.  Sweating and feeling clammy.  Confusion.  Dizziness or feeling light-headed.  Increased heart rate or palpitations.  Blurry vision.  Tingling or numbness around the mouth, lips, or tongue.  Tremors or shakes.  Irritability.  Keep a rapid-acting carbohydrate snack available before, during, and after exercise to help prevent or treat hypoglycemia.  Avoid injecting insulin into areas of the body that are going to be exercised. For example, avoid injecting insulin into:  The arms, when playing tennis.  The legs, when jogging.  Keep records of your exercise habits. Doing this can help you and your health care provider adjust your diabetes management plan as needed. Write down:  Food that you eat before and after you exercise.  Blood glucose levels before and after you exercise.  The type and amount of exercise you have done.  When your insulin is expected to peak, if you use insulin. Avoid exercising at times when your insulin is peaking.  When you start a new exercise or activity, work with your health care provider to make sure the activity is safe for you, and to adjust your insulin, medicines, or food intake as needed.  Drink plenty   of water while you exercise to prevent dehydration or heat stroke. Drink enough fluid to keep your urine clear or pale yellow. This information is not intended to replace advice given to you by your health care provider. Make sure you discuss any questions you have with  your health care provider. Document Released: 02/22/2004 Document Revised: 06/21/2016 Document Reviewed: 05/13/2016 Elsevier Interactive Patient Education  2017 Elsevier Inc.  

## 2017-05-14 NOTE — Progress Notes (Signed)
Subjective:  Patient ID: [name], female    DOB: 02/18/1956  Age: 61 y.o. MRN: [id number]  CC: Diabetes   HPI [name] is a 61 year old female with a history of type 2 diabetes mellitus (A1c 7.8 which is down from 9.6), dyslipidemia, coronary artery disease status post DES stent (in 08/2015, completed 12 months of dual antiplatelet therapy with Brilinta and ASA ), essential hypertension who comes in today for a follow-up visit.   She had previously been unable to tolerate metformin due to GI side effects but had agreed to commencing metformin rather than insulin due to uncontrolled diabetes. Metformin dose had been titrated up gradually and she currently takes 1000 mg twice daily which she tolerates well. Denies hypoglycemia; fasting blood sugars are in the 70 to 130 range and random blood sugar is less than 180. Denies visual complaints and numbness in extremities; not up-to-date on annual eye exam and lack of medical coverage has been a major limitation.   She has been compliant with her antihypertensive, statin and denies any side effects. She has not been to see her cardiologist in a while.  Healthcare maintenance - up-to-date on Pap smear, yet to have a mammogram or colonoscopy done despite encouraging her to do so on several occasions.  Past Medical History:  Diagnosis Date  . Anginal pain ([place])   . Coronary atherosclerosis of native coronary artery, with stent to LCX in 2006 06/04/2013  . Diabetes mellitus without complication ([place])   . GERD (gastroesophageal reflux disease)   . Heart attack ([place])   . Hyperlipidemia LDL goal < 70 06/07/2013  . Hypertension   . S/P coronary artery stent placement to RCA with PROMUS DES, 06/07/13, residual disease on OM1, OM2 and rPDA 06/07/2013    Past Surgical History:  Procedure Laterality Date  . CARDIAC CATHETERIZATION N/A 08/22/2015   Procedure: Left Heart Cath and Coronary Angiography;  Surgeon: Leonie Man, MD;  Location: [facility];  Service: Cardiovascular;  Laterality: N/A;  . CARDIAC CATHETERIZATION N/A 08/22/2015   Procedure: Coronary Stent Intervention;  Surgeon: Leonie Man, MD;  Location: [facility];  Service: Cardiovascular;  Laterality: N/A;  . CARDIAC CATHETERIZATION N/A 08/22/2015   Procedure: Left Heart Cath and Coronary Angiography;  Surgeon: Leonie Man, MD;  Location: [facility];  Service: Cardiovascular;  Laterality: N/A;  . CORONARY STENT PLACEMENT  2006   TAXUS stent CFX in [facility]  . LEFT HEART CATHETERIZATION WITH CORONARY ANGIOGRAM N/A 06/07/2013   Procedure: LEFT HEART CATHETERIZATION WITH CORONARY ANGIOGRAM;  Surgeon: Sanda Klein, MD;  Location: [facility];  Service: Cardiovascular;  Laterality: N/A;    No Known Allergies   Outpatient Medications Prior to Visit  Medication Sig Dispense Refill  . acetaminophen (TYLENOL) 500 MG tablet Take 1,000 mg by mouth daily as needed for moderate pain.     Marland Kitchen aspirin EC 81 MG EC tablet Take 1 tablet (81 mg total) by mouth daily.    . ranitidine (ZANTAC) 150 MG capsule Take 150 mg by mouth daily as needed for heartburn.    Marland Kitchen atorvastatin (LIPITOR) 80 MG tablet TAKE 1 TABLET BY MOUTH DAILY AT 6 PM 30 tablet 10  . furosemide (LASIX) 20 MG tablet TAKE 2 TABLETS BY MOUTH DAILY. 60 tablet 5  . glimepiride (AMARYL) 4 MG tablet TAKE 1 TABLET BY MOUTH 2 TIMES DAILY WITH A MEAL. 60 tablet 3  . INVOKANA 100 MG TABS tablet  TAKE 1 TABLET BY MOUTH DAILY 30 tablet 0  . isosorbide mononitrate (IMDUR) 30 MG 24 hr tablet Take 1 tablet (30 mg total) by mouth daily. 30 tablet 6  . lisinopril (PRINIVIL,ZESTRIL) 40 MG tablet TAKE 1 TABLET (40 MG TOTAL) BY MOUTH DAILY. 90 tablet 1  . metFORMIN (GLUCOPHAGE) 500 MG tablet Take 2 tablets (1,000 mg total) by mouth 2 (two) times daily with a meal. 120 tablet 3  . metoprolol (LOPRESSOR) 100 MG tablet TAKE 1 TABLET BY MOUTH TWICE DAILY. 60 tablet 10  . nitroGLYCERIN (NITROSTAT) 0.4 MG SL tablet  Place 1 tablet (0.4 mg total) under the tongue every 5 (five) minutes as needed for chest pain. (Patient not taking: Reported on 02/11/2017) 25 tablet 2   No facility-administered medications prior to visit.     ROS Review of Systems  Constitutional: Negative for activity change, appetite change and fatigue.  HENT: Negative for congestion, sinus pressure and sore throat.   Eyes: Negative for visual disturbance.  Respiratory: Negative for cough, chest tightness, shortness of breath and wheezing.   Cardiovascular: Negative for chest pain and palpitations.  Gastrointestinal: Negative for abdominal distention, abdominal pain and constipation.  Endocrine: Negative for polydipsia.  Genitourinary: Negative for dysuria and frequency.  Musculoskeletal: Negative for arthralgias and back pain.  Skin: Negative for rash.  Neurological: Negative for tremors, light-headedness and numbness.  Hematological: Does not bruise/bleed easily.  Psychiatric/Behavioral: Negative for agitation and behavioral problems.    Objective:  BP 124/77   Pulse 67   Temp 97.7 F (36.5 C) (Oral)   Wt 199 lb 12.8 oz (90.6 kg)   SpO2 98%   BMI 36.54 kg/m   BP/Weight 05/14/2017 4/96/7591 05/18/8465  Systolic BP 599 357 017  Diastolic BP 77 70 83  Wt. (Lbs) 199.8 207.2 213  BMI 36.54 37.9 38.96      Physical Exam  Constitutional: She is oriented to person, place, and time. She appears well-developed and well-nourished.  Cardiovascular: Normal rate, normal heart sounds and intact distal pulses.   No murmur heard. Pulmonary/Chest: Effort normal and breath sounds normal. She has no wheezes. She has no rales. She exhibits no tenderness.  Abdominal: Soft. Bowel sounds are normal. She exhibits no distension and no mass. There is no tenderness.  Musculoskeletal: Normal range of motion.  Neurological: She is alert and oriented to person, place, and time.  Skin: Skin is warm and dry.  Psychiatric: She has a normal mood  and affect.    Lab Results  Component Value Date   HGBA1C 7.8 05/14/2017    CMP Latest Ref Rng & Units 02/11/2017 05/06/2016 02/02/2016  Glucose 65 - 99 mg/dL 158(H) 104(H) 231(H)  BUN 7 - 25 mg/dL _0 Creatinine 0.50 - 1.05 mg/dL 0.79 0.80 0.88  Sodium 135 - 146 mmol/L 143 142 140  Potassium 3.5 - 5.3 mmol/L 3.6 3.5 4.1  Chloride 98 - 110 mmol/L 107 105 104  CO2 20 - 31 mmol/L _1 Calcium 8.6 - 10.4 mg/dL 9.6 9.6 9.2  Total Protein 6.1 - 8.1 g/dL 7.0 6.5 6.9  Total Bilirubin 0.2 - 1.2 mg/dL 0.6 0.6 0.4  Alkaline Phos 33 - 130 U/L 98 76 109  AST 10 - 35 U/L _2 ALT 6 - 29 U/L _3 Lipid Panel     Component Value Date/Time   CHOL 110 02/11/2017 1002   TRIG 90 02/11/2017 1002   HDL 46 (L)  02/11/2017 1002   CHOLHDL 2.4 02/11/2017 1002   VLDL 18 02/11/2017 1002   LDLCALC 46 02/11/2017 1002     Assessment & Plan:   1. Type 2 diabetes mellitus with hyperglycemia, without long-term current use of insulin (H[facility]) Improved significantly with the A1c of 7.8 dropping from 9.6 previously Commended on improvement Continue current regimen and lifestyle modifications Stressed the need to see an ophthalmologist for eye exam- lack of medical coverage is a limitation - POCT glucose (manual entry) - metFORMIN (GLUCOPHAGE) 500 MG tablet; Take 2 tablets (1,000 mg total) by mouth 2 (two) times daily with a meal.  Dispense: 120 tablet; Refill: 6 - canagliflozin (INVOKANA) 100 MG TABS tablet; Take 1 tablet (100 mg total) by mouth daily.  Dispense: 30 tablet; Refill: 6 - glimepiride (AMARYL) 4 MG tablet; TAKE 1 TABLET BY MOUTH 2 TIMES DAILY WITH A MEAL.  Dispense: 60 tablet; Refill: 6 - CMP14+EGFR  2. Chronic diastolic heart failure (HCC) Euvolemic EF 81-01%, grade 1 diastolic dysfunction from 2-D echo of 08/2015 - isosorbide mononitrate (IMDUR) 30 MG 24 hr tablet; Take 1 tablet (30 mg total) by mouth daily.  Dispense: 30 tablet; Refill: 6 - furosemide (LASIX) 20 MG  tablet; Take 2 tablets (40 mg total) by mouth daily.  Dispense: 60 tablet; Refill: 6  3. CAD -S/P RCA DES 08/22/15 Completed course of dual antiplatelet therapy Continue aspirin Risk factor modification  4. Dyslipidemia Controlled Low-cholesterol diet - atorvastatin (LIPITOR) 80 MG tablet; TAKE 1 TABLET BY MOUTH DAILY AT 6 PM  Dispense: 30 tablet; Refill: 6  5. Essential hypertension Controlled We'll check renal function and potassium - lisinopril (PRINIVIL,ZESTRIL) 40 MG tablet; Take 1 tablet (40 mg total) by mouth daily.  Dispense: 30 tablet; Refill: 6  6. Class 2 obesity due to excess calories without serious comorbidity with body mass index (BMI) of 36.0 to 36.9 in adult Lost 8 pounds in the last 3 months Continue exercise   Meds ordered this encounter  Medications  . metoprolol tartrate (LOPRESSOR) 100 MG tablet    Sig: Take 1 tablet (100 mg total) by mouth 2 (two) times daily.    Dispense:  60 tablet    Refill:  6  . metFORMIN (GLUCOPHAGE) 500 MG tablet    Sig: Take 2 tablets (1,000 mg total) by mouth 2 (two) times daily with a meal.    Dispense:  120 tablet    Refill:  6  . lisinopril (PRINIVIL,ZESTRIL) 40 MG tablet    Sig: Take 1 tablet (40 mg total) by mouth daily.    Dispense:  30 tablet    Refill:  6  . isosorbide mononitrate (IMDUR) 30 MG 24 hr tablet    Sig: Take 1 tablet (30 mg total) by mouth daily.    Dispense:  30 tablet    Refill:  6  . canagliflozin (INVOKANA) 100 MG TABS tablet    Sig: Take 1 tablet (100 mg total) by mouth daily.    Dispense:  30 tablet    Refill:  6    Must have office visit for refills  . glimepiride (AMARYL) 4 MG tablet    Sig: TAKE 1 TABLET BY MOUTH 2 TIMES DAILY WITH A MEAL.    Dispense:  60 tablet    Refill:  6  . furosemide (LASIX) 20 MG tablet    Sig: Take 2 tablets (40 mg total) by mouth daily.    Dispense:  60 tablet    Refill:  6  .  atorvastatin (LIPITOR) 80 MG tablet    Sig: TAKE 1 TABLET BY MOUTH DAILY AT 6 PM     Dispense:  30 tablet    Refill:  6    Follow-up: Return in about 3 months (around 08/14/2017) for Follow-up on diabetes mellitus.   Arnoldo Morale MD

## 2017-05-15 LAB — CMP14+EGFR
A/G RATIO: 1.7 (ref 1.2–2.2)
ALT: 18 IU/L (ref 0–32)
AST: 14 IU/L (ref 0–40)
Albumin: 4.1 g/dL (ref 3.5–5.5)
Alkaline Phosphatase: 99 IU/L (ref 39–117)
BUN/Creatinine Ratio: 19 (ref 9–23)
BUN: 14 mg/dL (ref 6–24)
Bilirubin Total: 0.3 mg/dL (ref 0.0–1.2)
CALCIUM: 9.5 mg/dL (ref 8.7–10.2)
CO2: 26 mmol/L (ref 18–29)
Chloride: 103 mmol/L (ref 96–106)
Creatinine, Ser: 0.75 mg/dL (ref 0.57–1.00)
GFR calc Af Amer: 102 mL/min/{1.73_m2} (ref >59)
GFR, EST NON AFRICAN AMERICAN: 88 mL/min/{1.73_m2} (ref >59)
GLUCOSE: 118 mg/dL — AB (ref 65–99)
Globulin, Total: 2.4 g/dL (ref 1.5–4.5)
POTASSIUM: 3.9 mmol/L (ref 3.5–5.2)
Sodium: 146 mmol/L — ABNORMAL HIGH (ref 134–144)
Total Protein: 6.5 g/dL (ref 6.0–8.5)

## 2017-06-19 MED FILL — ?GLIMEPIRIDE 4 MG TABLET: 4 | 30 days supply | Qty: 60 | Fill #1

## 2017-06-19 MED FILL — ISOSORBIDE MN ER 30 MG TAB: 30 | 30 days supply | Qty: 30 | Fill #1

## 2017-06-19 MED FILL — ATORVASTATIN 80 MG TABLET: 80 | 30 days supply | Qty: 30 | Fill #1

## 2017-06-19 MED FILL — ?FUROSEMIDE 20 MG TABLET: 20 | 30 days supply | Qty: 60 | Fill #1

## 2017-06-26 MED FILL — ?METOPROLOL 100 MG TABLET: 100 | 30 days supply | Qty: 60 | Fill #1

## 2017-06-26 MED FILL — ?METFORMIN HCL 500MG TABLET: 500 | 30 days supply | Qty: 120 | Fill #1

## 2017-06-26 MED FILL — INVOKANA 100 MG TABLET: 100 | 30 days supply | Qty: 30 | Fill #1

## 2017-07-18 ENCOUNTER — Ambulatory Visit: Payer: Self-pay | Admitting: Family Medicine

## 2017-07-21 ENCOUNTER — Other Ambulatory Visit: Payer: Self-pay | Admitting: Cardiovascular Disease

## 2017-07-21 MED FILL — LISINOPRIL 40 MG TABLET: 40 | 90 days supply | Qty: 90 | Fill #1

## 2017-07-21 MED FILL — ?FUROSEMIDE 20 MG TABLET: 20 | 30 days supply | Qty: 60 | Fill #2

## 2017-07-21 MED FILL — ISOSORBIDE MN ER 30 MG TAB: 30 | 30 days supply | Qty: 30 | Fill #2

## 2017-07-21 MED FILL — ?GLIMEPIRIDE 4 MG TABLET: 4 | 30 days supply | Qty: 60 | Fill #2

## 2017-07-21 MED FILL — ATORVASTATIN 80 MG TABLET: 80 | 30 days supply | Qty: 30 | Fill #2

## 2017-07-21 MED FILL — INVOKANA 100 MG TABLET: 100 | 30 days supply | Qty: 30 | Fill #2

## 2017-07-25 MED FILL — ?METOPROLOL 100 MG TABLET: 100 | 30 days supply | Qty: 60 | Fill #2

## 2017-08-19 ENCOUNTER — Encounter: Payer: Self-pay | Admitting: Family Medicine

## 2017-08-19 ENCOUNTER — Ambulatory Visit: Payer: Self-pay | Attending: Family Medicine | Admitting: Family Medicine

## 2017-08-19 VITALS — BP 147/85 | HR 64 | Temp 97.6°F | Ht 62.0 in | Wt 191.2 lb

## 2017-08-19 DIAGNOSIS — E1169 Type 2 diabetes mellitus with other specified complication: Secondary | ICD-10-CM | POA: Insufficient documentation

## 2017-08-19 DIAGNOSIS — I2510 Atherosclerotic heart disease of native coronary artery without angina pectoris: Secondary | ICD-10-CM | POA: Insufficient documentation

## 2017-08-19 DIAGNOSIS — Z7982 Long term (current) use of aspirin: Secondary | ICD-10-CM | POA: Insufficient documentation

## 2017-08-19 DIAGNOSIS — Z7984 Long term (current) use of oral hypoglycemic drugs: Secondary | ICD-10-CM | POA: Insufficient documentation

## 2017-08-19 DIAGNOSIS — I10 Essential (primary) hypertension: Secondary | ICD-10-CM

## 2017-08-19 DIAGNOSIS — E6609 Other obesity due to excess calories: Secondary | ICD-10-CM

## 2017-08-19 DIAGNOSIS — Z6836 Body mass index (BMI) 36.0-36.9, adult: Secondary | ICD-10-CM

## 2017-08-19 DIAGNOSIS — E785 Hyperlipidemia, unspecified: Secondary | ICD-10-CM | POA: Insufficient documentation

## 2017-08-19 DIAGNOSIS — K219 Gastro-esophageal reflux disease without esophagitis: Secondary | ICD-10-CM | POA: Insufficient documentation

## 2017-08-19 DIAGNOSIS — Z955 Presence of coronary angioplasty implant and graft: Secondary | ICD-10-CM | POA: Insufficient documentation

## 2017-08-19 DIAGNOSIS — Z79899 Other long term (current) drug therapy: Secondary | ICD-10-CM | POA: Insufficient documentation

## 2017-08-19 DIAGNOSIS — I252 Old myocardial infarction: Secondary | ICD-10-CM | POA: Insufficient documentation

## 2017-08-19 DIAGNOSIS — Z9861 Coronary angioplasty status: Secondary | ICD-10-CM

## 2017-08-19 DIAGNOSIS — I110 Hypertensive heart disease with heart failure: Secondary | ICD-10-CM | POA: Insufficient documentation

## 2017-08-19 DIAGNOSIS — Z9889 Other specified postprocedural states: Secondary | ICD-10-CM | POA: Insufficient documentation

## 2017-08-19 DIAGNOSIS — E1100 Type 2 diabetes mellitus with hyperosmolarity without nonketotic hyperglycemic-hyperosmolar coma (NKHHC): Secondary | ICD-10-CM

## 2017-08-19 DIAGNOSIS — I5032 Chronic diastolic (congestive) heart failure: Secondary | ICD-10-CM | POA: Insufficient documentation

## 2017-08-19 LAB — GLUCOSE, POCT (MANUAL RESULT ENTRY): POC Glucose: 95 mg/dl (ref 70–99)

## 2017-08-19 LAB — POCT GLYCOSYLATED HEMOGLOBIN (HGB A1C): Hemoglobin A1C: 7.3

## 2017-08-19 MED ORDER — ISOSORBIDE MONONITRATE ER 30 MG PO TB24: 30.0000 mg | ORAL_TABLET | Freq: Every day | ORAL | 6 refills | Status: DC

## 2017-08-19 MED ORDER — LISINOPRIL 40 MG PO TABS: 40.0000 mg | ORAL_TABLET | Freq: Every day | ORAL | 6 refills | Status: DC

## 2017-08-19 MED ORDER — CANAGLIFLOZIN 100 MG PO TABS: 100.0000 mg | ORAL_TABLET | Freq: Every day | ORAL | 6 refills | Status: DC

## 2017-08-19 MED ORDER — GLIMEPIRIDE 4 MG PO TABS: ORAL_TABLET | ORAL | 6 refills | Status: DC

## 2017-08-19 MED ORDER — FUROSEMIDE 20 MG PO TABS: 40.0000 mg | ORAL_TABLET | Freq: Every day | ORAL | 6 refills | Status: DC

## 2017-08-19 MED ORDER — METFORMIN HCL 500 MG PO TABS: 1000.0000 mg | ORAL_TABLET | Freq: Two times a day (BID) | ORAL | 6 refills | Status: DC

## 2017-08-19 MED ORDER — ATORVASTATIN CALCIUM 80 MG PO TABS: ORAL_TABLET | ORAL | 6 refills | Status: DC

## 2017-08-19 MED ORDER — METOPROLOL TARTRATE 100 MG PO TABS: 100.0000 mg | ORAL_TABLET | Freq: Two times a day (BID) | ORAL | 6 refills | Status: DC

## 2017-08-19 MED FILL — ATORVASTATIN 80 MG TABLET: 80 | 30 days supply | Qty: 30 | Fill #0

## 2017-08-19 MED FILL — GLIMEPIRIDE 4 MG TABLET: 4 | 30 days supply | Qty: 60 | Fill #0

## 2017-08-19 MED FILL — LISINOPRIL 40 MG TABLET: 40 | 30 days supply | Qty: 30 | Fill #0

## 2017-08-19 MED FILL — ISOSORBIDE MN ER 30 MG TAB: 30 | 30 days supply | Qty: 30 | Fill #0

## 2017-08-19 MED FILL — ?METOPROLOL 100 MG TABLET: 100 | 30 days supply | Qty: 60 | Fill #0

## 2017-08-19 MED FILL — FUROSEMIDE 20 MG TABLET: 20 | 30 days supply | Qty: 60 | Fill #0

## 2017-08-19 MED FILL — ?METFORMIN HCL 500MG TABLET: 500 | 30 days supply | Qty: 120 | Fill #0

## 2017-08-19 NOTE — Progress Notes (Signed)
Subjective:  Patient ID: [name], female    DOB: 06/17/1956  Age: 61 y.o. MRN: [id number]  CC: Diabetes   HPI [name] is a 61 year old female with a history of type 2 diabetes mellitus (A1c 7.3), dyslipidemia, coronary artery disease status post DES stent (in 08/2015), essential hypertension who comes in today for a follow-up visit.   She has lost 9 pounds since her last visit 3 months ago. Denies hypoglycemia; fasting blood sugars are in the 70 to 130 range and random blood sugar is less than 180. Denies visual complaints and numbness in extremities; not up-to-date on annual eye exam and lack of medical coverage has been a major limitation.   She has been compliant with her antihypertensive, statin and denies any side effects. She has not been to see her cardiologist in a while.  Healthcare maintenance - up-to-date on Pap smear, yet to have a mammogram or colonoscopy done despite encouraging her to do so on several occasions. Declines the flu shot.  She has no other acute concerns today.  Past Medical History:  Diagnosis Date  . Anginal pain ([place])   . Coronary atherosclerosis of native coronary artery, with stent to LCX in 2006 06/04/2013  . Diabetes mellitus without complication ([place])   . GERD (gastroesophageal reflux disease)   . Heart attack ([place])   . Hyperlipidemia LDL goal < 70 06/07/2013  . Hypertension   . S/P coronary artery stent placement to RCA with PROMUS DES, 06/07/13, residual disease on OM1, OM2 and rPDA 06/07/2013    Past Surgical History:  Procedure Laterality Date  . CARDIAC CATHETERIZATION N/A 08/22/2015   Procedure: Left Heart Cath and Coronary Angiography;  Surgeon: Leonie Man, MD;  Location: [facility];  Service: Cardiovascular;  Laterality: N/A;  . CARDIAC CATHETERIZATION N/A 08/22/2015   Procedure: Coronary Stent Intervention;  Surgeon: Leonie Man, MD;  Location[facility];  Service: Cardiovascular;  Laterality: N/A;  .  CARDIAC CATHETERIZATION N/A 08/22/2015   Procedure: Left Heart Cath and Coronary Angiography;  Surgeon: Leonie Man, MD;  Location: [facility];  Service: Cardiovascular;  Laterality: N/A;  . CORONARY STENT PLACEMENT  2006   TAXUS stent CFX in [facility]  . LEFT HEART CATHETERIZATION WITH CORONARY ANGIOGRAM N/A 06/07/2013   Procedure: LEFT HEART CATHETERIZATION WITH CORONARY ANGIOGRAM;  Surgeon: Sanda Klein, MD;  Location: [facility];  Service: Cardiovascular;  Laterality: N/A;    No Known Allergies   Outpatient Medications Prior to Visit  Medication Sig Dispense Refill  . acetaminophen (TYLENOL) 500 MG tablet Take 1,000 mg by mouth daily as needed for moderate pain.     Marland Kitchen aspirin EC 81 MG EC tablet Take 1 tablet (81 mg total) by mouth daily.    . nitroGLYCERIN (NITROSTAT) 0.4 MG SL tablet Place 1 tablet (0.4 mg total) under the tongue every 5 (five) minutes as needed for chest pain. 25 tablet 2  . ranitidine (ZANTAC) 150 MG capsule Take 150 mg by mouth daily as needed for heartburn.    Marland Kitchen atorvastatin (LIPITOR) 80 MG tablet TAKE 1 TABLET BY MOUTH DAILY AT 6 PM 30 tablet 6  . canagliflozin (INVOKANA) 100 MG TABS tablet Take 1 tablet (100 mg total) by mouth daily. 30 tablet 6  . furosemide (LASIX) 20 MG tablet Take 2 tablets (40 mg total) by mouth daily. 60 tablet 6  . glimepiride (AMARYL) 4 MG tablet TAKE 1 TABLET BY MOUTH 2 TIMES  DAILY WITH A MEAL. 60 tablet 6  . isosorbide mononitrate (IMDUR) 30 MG 24 hr tablet Take 1 tablet (30 mg total) by mouth daily. 30 tablet 6  . lisinopril (PRINIVIL,ZESTRIL) 40 MG tablet Take 1 tablet (40 mg total) by mouth daily. Schedule appointment for further refills 30 tablet 0  . metFORMIN (GLUCOPHAGE) 500 MG tablet Take 2 tablets (1,000 mg total) by mouth 2 (two) times daily with a meal. 120 tablet 6  . metoprolol tartrate (LOPRESSOR) 100 MG tablet Take 1 tablet (100 mg total) by mouth 2 (two) times daily. 60 tablet 6   No facility-administered  medications prior to visit.     ROS Review of Systems  Constitutional: Negative for activity change, appetite change and fatigue.  HENT: Negative for congestion, sinus pressure and sore throat.   Eyes: Negative for visual disturbance.  Respiratory: Negative for cough, chest tightness, shortness of breath and wheezing.   Cardiovascular: Negative for chest pain and palpitations.  Gastrointestinal: Negative for abdominal distention, abdominal pain and constipation.  Endocrine: Negative for polydipsia.  Genitourinary: Negative for dysuria and frequency.  Musculoskeletal: Negative for arthralgias and back pain.  Skin: Negative for rash.  Neurological: Negative for tremors, light-headedness and numbness.  Hematological: Does not bruise/bleed easily.  Psychiatric/Behavioral: Negative for agitation and behavioral problems.    Objective:  BP (!) 147/85   Pulse 64   Temp 97.6 F (36.4 C) (Oral)   Ht 5\' 2"  (1.575 m)   Wt 191 lb 3.2 oz (86.7 kg)   SpO2 100%   BMI 34.97 kg/m   BP/Weight 08/19/2017 05/14/2017 7/86/7672  Systolic BP 094 709 628  Diastolic BP 85 77 70  Wt. (Lbs) 191.2 199.8 207.2  BMI 34.97 36.54 37.9      Physical Exam Constitutional: She is oriented to person, place, and time. She appears well-developed and well-nourished.  Cardiovascular: Normal rate, normal heart sounds and intact distal pulses.   No murmur heard. Pulmonary/Chest: Effort normal and breath sounds normal. She has no wheezes. She has no rales. She exhibits no tenderness.  Abdominal: Soft. Bowel sounds are normal. She exhibits no distension and no mass. There is no tenderness.  Musculoskeletal: Normal range of motion.  Neurological: She is alert and oriented to person, place, and time.  Skin: Skin is warm and dry.  Psychiatric: She has a normal mood and affect.    CMP Latest Ref Rng & Units 05/14/2017 02/11/2017 05/06/2016  Glucose 65 - 99 mg/dL 118(H) 158(H) 104(H)  BUN 6 - 24 mg/dL 14 16 17     Creatinine 0.57 - 1.00 mg/dL 0.75 0.79 0.80  Sodium 134 - 144 mmol/L 146(H) 143 142  Potassium 3.5 - 5.2 mmol/L 3.9 3.6 3.5  Chloride 96 - 106 mmol/L 103 107 105  CO2 18 - 29 mmol/L 26 27 26   Calcium 8.7 - 10.2 mg/dL 9.5 9.6 9.6  Total Protein 6.0 - 8.5 g/dL 6.5 7.0 6.5  Total Bilirubin 0.0 - 1.2 mg/dL 0.3 0.6 0.6  Alkaline Phos 39 - 117 IU/L 99 98 76  AST 0 - 40 IU/L 14 14 13   ALT 0 - 32 IU/L 18 13 14     Lipid Panel     Component Value Date/Time   CHOL 110 02/11/2017 1002   TRIG 90 02/11/2017 1002   HDL 46 (L) 02/11/2017 1002   CHOLHDL 2.4 02/11/2017 1002   VLDL 18 02/11/2017 1002   LDLCALC 46 02/11/2017 1002      Lab Results  Component Value Date  HGBA1C 7.3 08/19/2017    Assessment & Plan:   1. Type 2 diabetes mellitus with other specified complication, without long-term current use of insulin (HCC) Controlled with A1c of 7.3 Continue current medications, diabetic diet - POCT glucose (manual entry) - POCT glycosylated hemoglobin (Hb A1C) - glimepiride (AMARYL) 4 MG tablet; TAKE 1 TABLET BY MOUTH 2 TIMES DAILY WITH A MEAL.  Dispense: 60 tablet; Refill: 6 - metFORMIN (GLUCOPHAGE) 500 MG tablet; Take 2 tablets (1,000 mg total) by mouth 2 (two) times daily with a meal.  Dispense: 120 tablet; Refill: 6 - canagliflozin (INVOKANA) 100 MG TABS tablet; Take 1 tablet (100 mg total) by mouth daily.  Dispense: 30 tablet; Refill: 6 - Basic Metabolic Panel  2. Dyslipidemia Controlled Low cholesterol diet - atorvastatin (LIPITOR) 80 MG tablet; TAKE 1 TABLET BY MOUTH DAILY AT 6 PM  Dispense: 30 tablet; Refill: 6  3. Chronic diastolic heart failure (HCC) EF 65-70% from 08/2015 Euvolemic - isosorbide mononitrate (IMDUR) 30 MG 24 hr tablet; Take 1 tablet (30 mg total) by mouth daily.  Dispense: 30 tablet; Refill: 6 - furosemide (LASIX) 20 MG tablet; Take 2 tablets (40 mg total) by mouth daily.  Dispense: 60 tablet; Refill: 6 - Ambulatory referral to Cardiology  4. Class 2  obesity due to excess calories without serious comorbidity with body mass index (BMI) of 36.0 to 36.9 in adult She has lost 9 pounds in the last 3 months Encouraged to continue working on weight loss  5. CAD -S/P RCA DES 08/22/15 No angina Has not been to see a cardiologist in a while Referral placed - Ambulatory referral to Cardiology  6. Essential hypertension Slightly elevated above goal of less than 130/80, was 124/77 at last visit Encouraged adherence to lifestyle modifications and low-sodium diet We'll reassess at next visit  - metoprolol tartrate (LOPRESSOR) 100 MG tablet; Take 1 tablet (100 mg total) by mouth 2 (two) times daily.  Dispense: 60 tablet; Refill: 6 - lisinopril (PRINIVIL,ZESTRIL) 40 MG tablet; Take 1 tablet (40 mg total) by mouth daily. Schedule appointment for further refills  Dispense: 30 tablet; Refill: 6   Meds ordered this encounter  Medications  . glimepiride (AMARYL) 4 MG tablet    Sig: TAKE 1 TABLET BY MOUTH 2 TIMES DAILY WITH A MEAL.    Dispense:  60 tablet    Refill:  6  . atorvastatin (LIPITOR) 80 MG tablet    Sig: TAKE 1 TABLET BY MOUTH DAILY AT 6 PM    Dispense:  30 tablet    Refill:  6  . metFORMIN (GLUCOPHAGE) 500 MG tablet    Sig: Take 2 tablets (1,000 mg total) by mouth 2 (two) times daily with a meal.    Dispense:  120 tablet    Refill:  6  . metoprolol tartrate (LOPRESSOR) 100 MG tablet    Sig: Take 1 tablet (100 mg total) by mouth 2 (two) times daily.    Dispense:  60 tablet    Refill:  6  . canagliflozin (INVOKANA) 100 MG TABS tablet    Sig: Take 1 tablet (100 mg total) by mouth daily.    Dispense:  30 tablet    Refill:  6    Must have office visit for refills  . isosorbide mononitrate (IMDUR) 30 MG 24 hr tablet    Sig: Take 1 tablet (30 mg total) by mouth daily.    Dispense:  30 tablet    Refill:  6  . lisinopril (PRINIVIL,ZESTRIL) 40 MG  tablet    Sig: Take 1 tablet (40 mg total) by mouth daily. Schedule appointment for further  refills    Dispense:  30 tablet    Refill:  6  . furosemide (LASIX) 20 MG tablet    Sig: Take 2 tablets (40 mg total) by mouth daily.    Dispense:  60 tablet    Refill:  6    Follow-up: Return in about 3 months (around 11/18/2017) for Follow-up on diabetes mellitus.   Arnoldo Morale MD

## 2017-08-20 LAB — BASIC METABOLIC PANEL
BUN / CREAT RATIO: 22 (ref 9–23)
BUN: 17 mg/dL (ref 6–24)
CHLORIDE: 106 mmol/L (ref 96–106)
CO2: 20 mmol/L (ref 20–29)
Calcium: 9.8 mg/dL (ref 8.7–10.2)
Creatinine, Ser: 0.78 mg/dL (ref 0.57–1.00)
GFR calc Af Amer: 97 mL/min/{1.73_m2} (ref >59)
GFR calc non Af Amer: 84 mL/min/{1.73_m2} (ref >59)
Glucose: 86 mg/dL (ref 65–99)
POTASSIUM: 4.1 mmol/L (ref 3.5–5.2)
SODIUM: 143 mmol/L (ref 134–144)

## 2017-08-26 MED FILL — INVOKANA 100 MG TABLET: 100 | 30 days supply | Qty: 30 | Fill #3

## 2017-09-08 ENCOUNTER — Encounter: Payer: Self-pay | Admitting: Cardiology

## 2017-09-08 ENCOUNTER — Ambulatory Visit (INDEPENDENT_AMBULATORY_CARE_PROVIDER_SITE_OTHER): Payer: Self-pay | Admitting: Cardiology

## 2017-09-08 VITALS — BP 136/72 | HR 71 | Ht 62.0 in | Wt 194.2 lb

## 2017-09-08 DIAGNOSIS — I10 Essential (primary) hypertension: Secondary | ICD-10-CM

## 2017-09-08 DIAGNOSIS — I110 Hypertensive heart disease with heart failure: Secondary | ICD-10-CM

## 2017-09-08 DIAGNOSIS — Z9861 Coronary angioplasty status: Secondary | ICD-10-CM

## 2017-09-08 DIAGNOSIS — I2510 Atherosclerotic heart disease of native coronary artery without angina pectoris: Secondary | ICD-10-CM

## 2017-09-08 DIAGNOSIS — I5032 Chronic diastolic (congestive) heart failure: Secondary | ICD-10-CM

## 2017-09-08 DIAGNOSIS — E785 Hyperlipidemia, unspecified: Secondary | ICD-10-CM

## 2017-09-08 NOTE — Assessment & Plan Note (Signed)
Controlled.  

## 2017-09-08 NOTE — Progress Notes (Signed)
09/08/2017 [name]   1956-07-12  [id number]  Primary Physician Arnoldo Morale, MD Primary Cardiologist: Dr Sallyanne Kuster  HPI:  61 year old female with history of hypertension, hyperlipidemia, diabetes, obesity and known CAD. CAD history began in 2006 where she had a stent placed in her circumflex. She then had acute coronary syndrome in June 2014 underwent PCI to the RCA. She did well for about a year and then apparently because of financial issues abruptly stop all of her medications. She presented 08/18/15 with a NSTEMI.  She underwent two-vessel PCI of the LAD and RCA (new sites) with DES stents on 08/22/15.  Post cath she had severe 10/10 chest pain. She went for re-look catheterization which revealed widely patent LAD and RCA stents. She was noted to be profoundly hypertensive. Pain did improve with improvement of her blood pressure.  Dr Ellyn Hack felt her chest pain was from uncontrolled HTN and her medications were adjusted.  She was admitted again 08/26/15-08/27/15 for chest pain. It was felt her symptoms were atypical and she was observed overnight and discharged.  She has done well since. She has diastolic CHF which has been stable on medical Rx. Her Brilinta was stopped for a coloscopy in Aug 2018 and not resumed, Dr Sallyanne Kuster felt she no longer needed it. She is in the office today for a 6 month check. She has chronic DOE, unchanged. She does not watch her sodium intake or weigh herself. She is compliant with her medications and denies orthopnea or edema.    Current Outpatient Prescriptions  Medication Sig Dispense Refill  . acetaminophen (TYLENOL) 500 MG tablet Take 1,000 mg by mouth daily as needed for moderate pain.     Marland Kitchen aspirin EC 81 MG EC tablet Take 1 tablet (81 mg total) by mouth daily.    Marland Kitchen atorvastatin (LIPITOR) 80 MG tablet TAKE 1 TABLET BY MOUTH DAILY AT 6 PM 30 tablet 6  . canagliflozin (INVOKANA) 100 MG TABS tablet Take 1 tablet (100 mg total) by mouth daily. 30 tablet 6  .  furosemide (LASIX) 20 MG tablet Take 2 tablets (40 mg total) by mouth daily. 60 tablet 6  . glimepiride (AMARYL) 4 MG tablet TAKE 1 TABLET BY MOUTH 2 TIMES DAILY WITH A MEAL. 60 tablet 6  . isosorbide mononitrate (IMDUR) 30 MG 24 hr tablet Take 1 tablet (30 mg total) by mouth daily. 30 tablet 6  . lisinopril (PRINIVIL,ZESTRIL) 40 MG tablet Take 1 tablet (40 mg total) by mouth daily. Schedule appointment for further refills 30 tablet 6  . metFORMIN (GLUCOPHAGE) 500 MG tablet Take 2 tablets (1,000 mg total) by mouth 2 (two) times daily with a meal. 120 tablet 6  . metoprolol tartrate (LOPRESSOR) 100 MG tablet Take 1 tablet (100 mg total) by mouth 2 (two) times daily. 60 tablet 6  . nitroGLYCERIN (NITROSTAT) 0.4 MG SL tablet Place 1 tablet (0.4 mg total) under the tongue every 5 (five) minutes as needed for chest pain. 25 tablet 2  . ranitidine (ZANTAC) 150 MG capsule Take 150 mg by mouth daily as needed for heartburn.     No current facility-administered medications for this visit.     No Known Allergies  Past Medical History:  Diagnosis Date  . Anginal pain ([place])   . Coronary atherosclerosis of native coronary artery, with stent to LCX in 2006 06/04/2013  . Diabetes mellitus without complication ([place])   . GERD (gastroesophageal reflux disease)   . Heart attack ([place])   . Hyperlipidemia  LDL goal < 70 06/07/2013  . Hypertension   . S/P coronary artery stent placement to RCA with PROMUS DES, 06/07/13, residual disease on OM1, OM2 and rPDA 06/07/2013    Social History   Social History  . Marital status: Divorced    Spouse name: N/A  . Number of children: 1  . Years of education: N/A   Occupational History  . cashier    Social History Main Topics  . Smoking status: Former Smoker    Years: 25.00    Quit date: 07/03/2004  . Smokeless tobacco: Never Used  . Alcohol use 0.6 - 1.2 oz/week    1 - 2 Shots of liquor per week     Comment: 1-2 times weekly  . Drug use: No  . Sexual activity:  Not on file   Other Topics Concern  . Not on file   Social History Narrative  . No narrative on file     Family History  Problem Relation Age of Onset  . Diabetes Mother   . Hypertension Mother   . Diabetes Sister   . Diabetes Brother   . Hypertension Brother      Review of Systems: General: negative for chills, fever, night sweats or weight changes.  Cardiovascular: negative for chest pain, dyspnea on exertion, edema, orthopnea, palpitations, paroxysmal nocturnal dyspnea or shortness of breath Dermatological: negative for rash Respiratory: negative for cough or wheezing Urologic: negative for hematuria Abdominal: negative for nausea, vomiting, diarrhea, bright red blood per rectum, melena, or hematemesis Neurologic: negative for visual changes, syncope, or dizziness All other systems reviewed and are otherwise negative except as noted above.    Blood pressure 136/72, pulse 71, height 5\' 2"  (1.575 m), weight 194 lb 3.2 oz (88.1 kg).  General appearance: alert, cooperative, no distress and moderately obese Neck: no carotid bruit and no JVD Lungs: clear to auscultation bilaterally Heart: regular rate and rhythm Extremities: extremities normal, atraumatic, no cyanosis or edema Skin: Skin color, texture, turgor normal. No rashes or lesions Neurologic: Grossly normal  EKG NSR TWI V5-6-no change  ASSESSMENT AND PLAN:   Chronic diastolic CHF (congestive heart failure) (HCC) EF 65% with grade 1 DD Sept 2016-currently stable  CAD -S/P RCA DES 08/22/15 No angina-now off Brilinta  Dyslipidemia Statin Rx- PCP follows  Essential hypertension Controlled   PLAN  Same Rx. F/U 6 months with Dr Sallyanne Kuster.  Kerin Ransom PA-C 09/08/2017 9:41 AM

## 2017-09-08 NOTE — Patient Instructions (Addendum)
Medication Instructions:  Your physician recommends that you continue on your current medications as directed. Please refer to the Current Medication list given to you today.   Labwork: None ordered  Testing/Procedures: None ordered  Follow-Up: Your physician recommends that you schedule a follow-up appointment in 6 months with Dr. Sallyanne Kuster.   If you need a refill on your cardiac medications before your next appointment, please call your pharmacy.

## 2017-09-08 NOTE — Assessment & Plan Note (Signed)
Statin Rx- PCP follows

## 2017-09-08 NOTE — Assessment & Plan Note (Signed)
EF 65% with grade 1 DD Sept 2016-currently stable

## 2017-09-08 NOTE — Assessment & Plan Note (Signed)
No angina-now off Brilinta

## 2017-09-18 MED FILL — ATORVASTATIN 80 MG TABLET: 80 | 30 days supply | Qty: 30 | Fill #1

## 2017-09-18 MED FILL — ISOSORBIDE MN ER 30 MG TAB: 30 | 30 days supply | Qty: 30 | Fill #1

## 2017-09-18 MED FILL — ?GLIMEPIRIDE 4 MG TABLET: 4 | 30 days supply | Qty: 60 | Fill #1

## 2017-09-18 MED FILL — ?FUROSEMIDE 20 MG TABLET: 20 | 30 days supply | Qty: 60 | Fill #1

## 2017-09-26 MED FILL — ?METOPROLOL 100 MG TABLET: 100 | 30 days supply | Qty: 60 | Fill #1

## 2017-09-26 MED FILL — INVOKANA 100 MG TABLET: 100 | 30 days supply | Qty: 30 | Fill #4

## 2017-10-22 MED FILL — ?METFORMIN HCL 500MG TABLET: 500 | 30 days supply | Qty: 120 | Fill #1

## 2017-10-22 MED FILL — ATORVASTATIN 80 MG TABLET: 80 | 30 days supply | Qty: 30 | Fill #2

## 2017-10-22 MED FILL — ISOSORBIDE MN ER 30 MG TAB: 30 | 30 days supply | Qty: 30 | Fill #2

## 2017-10-22 MED FILL — ?GLIMEPIRIDE 4 MG TABLET: 4 | 30 days supply | Qty: 60 | Fill #2

## 2017-10-22 MED FILL — ?FUROSEMIDE 20 MG TABLET: 20 | 30 days supply | Qty: 60 | Fill #2

## 2017-10-27 ENCOUNTER — Other Ambulatory Visit: Payer: Self-pay

## 2017-10-27 ENCOUNTER — Emergency Department (HOSPITAL_BASED_OUTPATIENT_CLINIC_OR_DEPARTMENT_OTHER)
Admission: EM | Admit: 2017-10-27 | Discharge: 2017-10-27 | Disposition: A | Discharge status: Home / Self Care | Payer: Self-pay | Attending: Emergency Medicine | Admitting: Emergency Medicine

## 2017-10-27 ENCOUNTER — Encounter (HOSPITAL_BASED_OUTPATIENT_CLINIC_OR_DEPARTMENT_OTHER): Payer: Self-pay

## 2017-10-27 DIAGNOSIS — I110 Hypertensive heart disease with heart failure: Secondary | ICD-10-CM | POA: Insufficient documentation

## 2017-10-27 DIAGNOSIS — E119 Type 2 diabetes mellitus without complications: Secondary | ICD-10-CM | POA: Insufficient documentation

## 2017-10-27 DIAGNOSIS — I5032 Chronic diastolic (congestive) heart failure: Secondary | ICD-10-CM | POA: Insufficient documentation

## 2017-10-27 DIAGNOSIS — I252 Old myocardial infarction: Secondary | ICD-10-CM | POA: Insufficient documentation

## 2017-10-27 DIAGNOSIS — I2510 Atherosclerotic heart disease of native coronary artery without angina pectoris: Secondary | ICD-10-CM | POA: Insufficient documentation

## 2017-10-27 DIAGNOSIS — Z79899 Other long term (current) drug therapy: Secondary | ICD-10-CM | POA: Insufficient documentation

## 2017-10-27 DIAGNOSIS — Z7984 Long term (current) use of oral hypoglycemic drugs: Secondary | ICD-10-CM | POA: Insufficient documentation

## 2017-10-27 DIAGNOSIS — Z87891 Personal history of nicotine dependence: Secondary | ICD-10-CM | POA: Insufficient documentation

## 2017-10-27 DIAGNOSIS — Z7982 Long term (current) use of aspirin: Secondary | ICD-10-CM | POA: Insufficient documentation

## 2017-10-27 DIAGNOSIS — H9012 Conductive hearing loss, unilateral, left ear, with unrestricted hearing on the contralateral side: Secondary | ICD-10-CM | POA: Insufficient documentation

## 2017-10-27 DIAGNOSIS — Z955 Presence of coronary angioplasty implant and graft: Secondary | ICD-10-CM | POA: Insufficient documentation

## 2017-10-27 NOTE — ED Notes (Signed)
ED Provider at bedside. 

## 2017-10-27 NOTE — ED Triage Notes (Signed)
C/o "muffled" hearing in both ears x 4 days-NAD-steady gait

## 2017-10-27 NOTE — Discharge Instructions (Signed)
Please call Dr. Janace Hoard for follow-up regarding conductive hearing loss from your left ear.

## 2017-10-27 NOTE — ED Provider Notes (Signed)
[facility] EMERGENCY DEPARTMENT Provider Note   CSN: [id number] Arrival date & time: 10/27/17  1312     History   Chief Complaint Chief Complaint  Patient presents with  . Hearing Problem    HPI [name] is a 61 y.o. female.  The history is provided by the patient.  Ear Fullness  This is a new problem. The current episode started more than 2 days ago. The problem occurs constantly. The problem has not changed since onset.Nothing aggravates the symptoms. Nothing relieves the symptoms. She has tried nothing for the symptoms.   61 year old female who presents with hearing loss out of the left ear.  Reports symptoms for the past 4 days.  Recently had a mild cold this weekend, but that has now resolved. No fevers, chills, nausea, vomiting, headache, vision or speech changes, focal numbness/weakness, cough, congestion, sore throat. No injury to ear.    Past Medical History:  Diagnosis Date  . Anginal pain ([place])   . Coronary atherosclerosis of native coronary artery, with stent to LCX in 2006 06/04/2013  . Diabetes mellitus without complication ([place])   . GERD (gastroesophageal reflux disease)   . Heart attack ([place])   . Hyperlipidemia LDL goal < 70 06/07/2013  . Hypertension   . S/P coronary artery stent placement to RCA with PROMUS DES, 06/07/13, residual disease on OM1, OM2 and rPDA 06/07/2013    Patient Active Problem List   Diagnosis Date Noted  . Osteoarthritis of both hands 05/01/2016  . Chronic diastolic CHF (congestive heart failure) ([place]) 12/06/2015  . Non-compliance with treatment-(cost) 08/24/2015  . Hypertensive cardiovascular disease 08/24/2015  . Dyslipidemia 08/24/2015  . CAD -S/P RCA DES 08/22/15   . NSTEMI (non-ST elevated myocardial infarction) ([place]) 08/18/2015  . Obesity- BMI 36 06/15/2013  . Type 2 diabetes mellitus, uncontrolled ([place]) 06/04/2013  . Essential hypertension 06/04/2013    Past Surgical History:  Procedure Laterality Date  .  CORONARY STENT PLACEMENT  2006   TAXUS stent CFX in [place]    [place] History    No data available       Home Medications    Prior to Admission medications   Medication Sig Start Date End Date Taking? Authorizing Provider  acetaminophen (TYLENOL) 500 MG tablet Take 1,000 mg by mouth daily as needed for moderate pain.     [provider]  aspirin EC 81 MG EC tablet Take 1 tablet (81 mg total) by mouth daily. 08/24/15   Erlene Quan, PA-C  atorvastatin (LIPITOR) 80 MG tablet TAKE 1 TABLET BY MOUTH DAILY AT 6 PM 08/19/17   Arnoldo Morale, MD  canagliflozin (INVOKANA) 100 MG TABS tablet Take 1 tablet (100 mg total) by mouth daily. 08/19/17   Arnoldo Morale, MD  furosemide (LASIX) 20 MG tablet Take 2 tablets (40 mg total) by mouth daily. 08/19/17   Arnoldo Morale, MD  glimepiride (AMARYL) 4 MG tablet TAKE 1 TABLET BY MOUTH 2 TIMES DAILY WITH A MEAL. 08/19/17   Arnoldo Morale, MD  isosorbide mononitrate (IMDUR) 30 MG 24 hr tablet Take 1 tablet (30 mg total) by mouth daily. 08/19/17   Arnoldo Morale, MD  lisinopril (PRINIVIL,ZESTRIL) 40 MG tablet Take 1 tablet (40 mg total) by mouth daily. Schedule appointment for further refills 08/19/17   Arnoldo Morale, MD  metFORMIN (GLUCOPHAGE) 500 MG tablet Take 2 tablets (1,000 mg total) by mouth 2 (two) times daily with a meal. 08/19/17   Arnoldo Morale, MD  metoprolol tartrate (LOPRESSOR) 100  MG tablet Take 1 tablet (100 mg total) by mouth 2 (two) times daily. 08/19/17   Arnoldo Morale, MD  nitroGLYCERIN (NITROSTAT) 0.4 MG SL tablet Place 1 tablet (0.4 mg total) under the tongue every 5 (five) minutes as needed for chest pain. 08/24/15   Erlene Quan, PA-C  ranitidine (ZANTAC) 150 MG capsule Take 150 mg by mouth daily as needed for heartburn.    [provider]    Family History Family History  Problem Relation Age of Onset  . Diabetes Mother   . Hypertension Mother   . Diabetes Sister   . Diabetes Brother   . Hypertension Brother     Social  History Social History   Tobacco Use  . Smoking status: Former Smoker    Years: 25.00    Last attempt to quit: 07/03/2004    Years since quitting: 13.3  . Smokeless tobacco: Never Used  Substance Use Topics  . Alcohol use: Yes    Comment: occ  . Drug use: No     Allergies   Patient has no known allergies.   Review of Systems Review of Systems  Constitutional: Negative for fever.  Eyes: Negative for visual disturbance.  Respiratory: Negative for cough.   Gastrointestinal: Negative for nausea and vomiting.  Neurological: Negative for speech difficulty, weakness and numbness.  Psychiatric/Behavioral: Negative for confusion.     Physical Exam Updated Vital Signs BP (!) 196/93 (BP Location: Right Arm)   Pulse 63   Temp 98.1 F (36.7 C) (Oral)   Resp 16   Ht 5\' 2"  (1.575 m)   Wt 88.5 kg (195 lb)   SpO2 100%   BMI 35.67 kg/m   Physical Exam Physical Exam  Nursing note and vitals reviewed. Constitutional: Well developed, well nourished, non-toxic, and in no acute distress Head: Normocephalic and atraumatic.  Mouth/Throat: Oropharynx is clear and moist.  Ear: Clear and flat bilateral TMs Neck: Normal range of motion. Neck supple.  Cardiovascular: Normal rate and regular rhythm.   Pulmonary/Chest: Effort normal and breath sounds normal.  Abdominal: Soft. There is no tenderness. There is no rebound and no guarding.  Musculoskeletal: Normal range of motion.  Neurological: Alert, no facial droop, fluent speech, moves all extremities symmetrically, Air greater than bone conduction with both ears with tuning fork (normal Rinne). Weber test localizes sound to the left ear.  Skin: Skin is warm and dry.  Psychiatric: Cooperative   ED Treatments / Results  Labs (all labs ordered are listed, but only abnormal results are displayed) Labs Reviewed - No data to display  EKG  EKG Interpretation None       Radiology No results found.  Procedures Procedures  (including critical care time)  Medications Ordered in ED Medications - No data to display   Initial Impression / Assessment and Plan / ED Course  I have reviewed the triage vital signs and the nursing notes.  Pertinent labs & imaging results that were available during my care of the patient were reviewed by me and considered in my medical decision making (see chart for details).     Presents with hearing loss from left ear x 4 days. Well appearing. No other neuro complaints. Recent URI, but no symptoms now. TM clear w/o wax or signs of middle ear effusion or bulging. Normal Rinne tests for both ears, but Weber localizes sound to the left ear, where she reports hearing loss. More suggestive of conductive hearing loss. No obvious cause at this time. Will refer  to ENT. Strict return and follow-up instructions reviewed. She expressed understanding of all discharge instructions and felt comfortable with the plan of care.   Final Clinical Impressions(s) / ED Diagnoses   Final diagnoses:  Conductive hearing loss of left ear with unrestricted hearing of right ear    ED Discharge Orders    None       Forde Dandy, MD 10/27/17 1547

## 2017-10-28 ENCOUNTER — Encounter: Payer: Self-pay | Admitting: Family Medicine

## 2017-10-28 ENCOUNTER — Ambulatory Visit: Payer: Self-pay | Attending: Family Medicine | Admitting: Family Medicine

## 2017-10-28 VITALS — BP 148/85 | HR 72 | Temp 97.9°F | Ht 62.0 in | Wt 197.0 lb

## 2017-10-28 DIAGNOSIS — Z7982 Long term (current) use of aspirin: Secondary | ICD-10-CM | POA: Insufficient documentation

## 2017-10-28 DIAGNOSIS — E119 Type 2 diabetes mellitus without complications: Secondary | ICD-10-CM | POA: Insufficient documentation

## 2017-10-28 DIAGNOSIS — E785 Hyperlipidemia, unspecified: Secondary | ICD-10-CM | POA: Insufficient documentation

## 2017-10-28 DIAGNOSIS — I252 Old myocardial infarction: Secondary | ICD-10-CM | POA: Insufficient documentation

## 2017-10-28 DIAGNOSIS — E11649 Type 2 diabetes mellitus with hypoglycemia without coma: Secondary | ICD-10-CM

## 2017-10-28 DIAGNOSIS — H918X2 Other specified hearing loss, left ear: Secondary | ICD-10-CM

## 2017-10-28 DIAGNOSIS — I2510 Atherosclerotic heart disease of native coronary artery without angina pectoris: Secondary | ICD-10-CM | POA: Insufficient documentation

## 2017-10-28 DIAGNOSIS — Z955 Presence of coronary angioplasty implant and graft: Secondary | ICD-10-CM | POA: Insufficient documentation

## 2017-10-28 DIAGNOSIS — H9192 Unspecified hearing loss, left ear: Secondary | ICD-10-CM | POA: Insufficient documentation

## 2017-10-28 DIAGNOSIS — Z7984 Long term (current) use of oral hypoglycemic drugs: Secondary | ICD-10-CM | POA: Insufficient documentation

## 2017-10-28 DIAGNOSIS — Z79899 Other long term (current) drug therapy: Secondary | ICD-10-CM | POA: Insufficient documentation

## 2017-10-28 DIAGNOSIS — K219 Gastro-esophageal reflux disease without esophagitis: Secondary | ICD-10-CM | POA: Insufficient documentation

## 2017-10-28 DIAGNOSIS — I10 Essential (primary) hypertension: Secondary | ICD-10-CM | POA: Insufficient documentation

## 2017-10-28 LAB — GLUCOSE, POCT (MANUAL RESULT ENTRY): POC Glucose: 313 mg/dl — AB (ref 70–99)

## 2017-10-28 MED ORDER — CETIRIZINE HCL 10 MG PO TABS: 10.0000 mg | ORAL_TABLET | Freq: Every day | ORAL | 1 refills | Status: DC

## 2017-10-28 MED FILL — ?CETIRIZINE HCL 10 MG TABLE: 10 | 30 days supply | Qty: 30 | Fill #0

## 2017-10-29 NOTE — Progress Notes (Signed)
Subjective:  Patient ID: [name], female    DOB: 08-24-1956  Age: 61 y.o. MRN: [id number]  CC: Hospitalization Follow-up   HPI [name]  is a 61 year old female with a history of type 2 diabetes mellitus (A1c 7.3), dyslipidemia, coronary artery disease status post DES stent (in 08/2015), essential hypertension who comes in today after being seen at the ED yesterday for hearing loss.  She complains of a one-week history of left ear hearing loss but denies presence of pain, postnasal drip, tinnitus or sinus pressure.  She was referred to ENT by the ED but was told she would need to pay $200 prior to an appointment when she called the ENT office. She denies any problems with her right ear and has no fever.  Past Medical History:  Diagnosis Date  . Anginal pain ([place])   . Coronary atherosclerosis of native coronary artery, with stent to LCX in 2006 06/04/2013  . Diabetes mellitus without complication ([place])   . GERD (gastroesophageal reflux disease)   . Heart attack ([place])   . Hyperlipidemia LDL goal < 70 06/07/2013  . Hypertension   . S/P coronary artery stent placement to RCA with PROMUS DES, 06/07/13, residual disease on OM1, OM2 and rPDA 06/07/2013    Past Surgical History:  Procedure Laterality Date  . CORONARY STENT PLACEMENT  2006   TAXUS stent CFX in [facility]    No Known Allergies   Outpatient Medications Prior to Visit  Medication Sig Dispense Refill  . acetaminophen (TYLENOL) 500 MG tablet Take 1,000 mg by mouth daily as needed for moderate pain.     Marland Kitchen aspirin EC 81 MG EC tablet Take 1 tablet (81 mg total) by mouth daily.    Marland Kitchen atorvastatin (LIPITOR) 80 MG tablet TAKE 1 TABLET BY MOUTH DAILY AT 6 PM 30 tablet 6  . canagliflozin (INVOKANA) 100 MG TABS tablet Take 1 tablet (100 mg total) by mouth daily. 30 tablet 6  . furosemide (LASIX) 20 MG tablet Take 2 tablets (40 mg total) by mouth daily. 60 tablet 6  . glimepiride (AMARYL) 4 MG tablet TAKE 1 TABLET BY MOUTH 2  TIMES DAILY WITH A MEAL. 60 tablet 6  . isosorbide mononitrate (IMDUR) 30 MG 24 hr tablet Take 1 tablet (30 mg total) by mouth daily. 30 tablet 6  . lisinopril (PRINIVIL,ZESTRIL) 40 MG tablet Take 1 tablet (40 mg total) by mouth daily. Schedule appointment for further refills 30 tablet 6  . metFORMIN (GLUCOPHAGE) 500 MG tablet Take 2 tablets (1,000 mg total) by mouth 2 (two) times daily with a meal. 120 tablet 6  . metoprolol tartrate (LOPRESSOR) 100 MG tablet Take 1 tablet (100 mg total) by mouth 2 (two) times daily. 60 tablet 6  . nitroGLYCERIN (NITROSTAT) 0.4 MG SL tablet Place 1 tablet (0.4 mg total) under the tongue every 5 (five) minutes as needed for chest pain. 25 tablet 2  . ranitidine (ZANTAC) 150 MG capsule Take 150 mg by mouth daily as needed for heartburn.     No facility-administered medications prior to visit.     ROS Review of Systems  Constitutional: Negative for activity change, appetite change and fatigue.  HENT: Positive for hearing loss. Negative for congestion, sinus pressure and sore throat.   Eyes: Negative for visual disturbance.  Respiratory: Negative for cough, chest tightness, shortness of breath and wheezing.   Cardiovascular: Negative for chest pain and palpitations.  Gastrointestinal: Negative for abdominal distention, abdominal pain and constipation.  Endocrine: Negative for polydipsia.  Genitourinary: Negative for dysuria and frequency.  Musculoskeletal: Negative for arthralgias and back pain.  Skin: Negative for rash.  Neurological: Negative for tremors, light-headedness and numbness.  Hematological: Does not bruise/bleed easily.  Psychiatric/Behavioral: Negative for agitation and behavioral problems.    Objective:  BP (!) 148/85   Pulse 72   Temp 97.9 F (36.6 C) (Oral)   Ht 5\' 2"  (1.575 m)   Wt 197 lb (89.4 kg)   SpO2 99%   BMI 36.03 kg/m   BP/Weight 10/28/2017 10/27/2017 0/24/0973  Systolic BP 532 992 426  Diastolic BP 85 93 72  Wt. (Lbs)  197 195 194.2  BMI 36.03 35.67 35.52      Physical Exam  Constitutional: She is oriented to person, place, and time. She appears well-developed and well-nourished.  Cardiovascular: Normal rate, normal heart sounds and intact distal pulses.  No murmur heard. Pulmonary/Chest: Effort normal and breath sounds normal. She has no wheezes. She has no rales. She exhibits no tenderness.  Abdominal: Soft. Bowel sounds are normal. She exhibits no distension and no mass. There is no tenderness.  Musculoskeletal: Normal range of motion.  Neurological: She is alert and oriented to person, place, and time.     Lab Results  Component Value Date   HGBA1C 7.3 08/19/2017  ' Assessment & Plan:   1. Type 2 diabetes mellitus without complication, without long-term current use of insulin (HCC) Controlled with last A1c of 7.3 Continue current management Diabetic follow-up at next visit - POCT glucose (manual entry)  2. 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  Dispense: 30 tablet; Refill: 1   Meds ordered this encounter  Medications  . cetirizine (ZYRTEC) 10 MG tablet    Sig: Take 1 tablet (10 mg total) daily by mouth.    Dispense:  30 tablet    Refill:  1    Follow-up: Return for Follow-up of diabetes mellitus; keep previously scheduled appointment.   Arnoldo Morale MD

## 2017-10-30 MED FILL — INVOKANA 100 MG TABLET: 100 | 30 days supply | Qty: 30 | Fill #5

## 2017-10-30 MED FILL — ?METOPROLOL 100 MG TABLET: 100 | 30 days supply | Qty: 60 | Fill #2

## 2017-11-18 ENCOUNTER — Ambulatory Visit: Payer: Self-pay | Attending: Family Medicine | Admitting: Family Medicine

## 2017-11-18 ENCOUNTER — Encounter: Payer: Self-pay | Admitting: Family Medicine

## 2017-11-18 VITALS — BP 130/80 | HR 64 | Temp 97.8°F | Ht 62.0 in | Wt 198.2 lb

## 2017-11-18 DIAGNOSIS — Z7984 Long term (current) use of oral hypoglycemic drugs: Secondary | ICD-10-CM | POA: Insufficient documentation

## 2017-11-18 DIAGNOSIS — K219 Gastro-esophageal reflux disease without esophagitis: Secondary | ICD-10-CM | POA: Insufficient documentation

## 2017-11-18 DIAGNOSIS — I10 Essential (primary) hypertension: Secondary | ICD-10-CM

## 2017-11-18 DIAGNOSIS — Z9889 Other specified postprocedural states: Secondary | ICD-10-CM | POA: Insufficient documentation

## 2017-11-18 DIAGNOSIS — Z79899 Other long term (current) drug therapy: Secondary | ICD-10-CM | POA: Insufficient documentation

## 2017-11-18 DIAGNOSIS — E1169 Type 2 diabetes mellitus with other specified complication: Secondary | ICD-10-CM

## 2017-11-18 DIAGNOSIS — I2510 Atherosclerotic heart disease of native coronary artery without angina pectoris: Secondary | ICD-10-CM | POA: Insufficient documentation

## 2017-11-18 DIAGNOSIS — E11649 Type 2 diabetes mellitus with hypoglycemia without coma: Secondary | ICD-10-CM

## 2017-11-18 DIAGNOSIS — Z955 Presence of coronary angioplasty implant and graft: Secondary | ICD-10-CM | POA: Insufficient documentation

## 2017-11-18 DIAGNOSIS — H6123 Impacted cerumen, bilateral: Secondary | ICD-10-CM

## 2017-11-18 DIAGNOSIS — H918X2 Other specified hearing loss, left ear: Secondary | ICD-10-CM

## 2017-11-18 DIAGNOSIS — Z7982 Long term (current) use of aspirin: Secondary | ICD-10-CM | POA: Insufficient documentation

## 2017-11-18 DIAGNOSIS — E785 Hyperlipidemia, unspecified: Secondary | ICD-10-CM

## 2017-11-18 DIAGNOSIS — I110 Hypertensive heart disease with heart failure: Secondary | ICD-10-CM | POA: Insufficient documentation

## 2017-11-18 DIAGNOSIS — I252 Old myocardial infarction: Secondary | ICD-10-CM | POA: Insufficient documentation

## 2017-11-18 DIAGNOSIS — I5032 Chronic diastolic (congestive) heart failure: Secondary | ICD-10-CM

## 2017-11-18 LAB — GLUCOSE, POCT (MANUAL RESULT ENTRY): POC GLUCOSE: 82 mg/dL (ref 70–99)

## 2017-11-18 MED ORDER — CANAGLIFLOZIN 100 MG PO TABS: 100.0000 mg | ORAL_TABLET | Freq: Every day | ORAL | 6 refills | Status: DC

## 2017-11-18 MED ORDER — METOPROLOL TARTRATE 100 MG PO TABS: 100.0000 mg | ORAL_TABLET | Freq: Two times a day (BID) | ORAL | 6 refills | Status: DC

## 2017-11-18 MED ORDER — ATORVASTATIN CALCIUM 80 MG PO TABS: ORAL_TABLET | ORAL | 6 refills | Status: DC

## 2017-11-18 MED ORDER — METFORMIN HCL 500 MG PO TABS: 1000.0000 mg | ORAL_TABLET | Freq: Two times a day (BID) | ORAL | 6 refills | Status: DC

## 2017-11-18 MED ORDER — FUROSEMIDE 20 MG PO TABS: 40.0000 mg | ORAL_TABLET | Freq: Every day | ORAL | 6 refills | Status: DC

## 2017-11-18 MED ORDER — CETIRIZINE HCL 10 MG PO TABS: 10.0000 mg | ORAL_TABLET | Freq: Every day | ORAL | 1 refills | Status: DC

## 2017-11-18 MED ORDER — LISINOPRIL 40 MG PO TABS: 40.0000 mg | ORAL_TABLET | Freq: Every day | ORAL | 6 refills | Status: DC

## 2017-11-18 MED ORDER — ISOSORBIDE MONONITRATE ER 30 MG PO TB24: 30.0000 mg | ORAL_TABLET | Freq: Every day | ORAL | 6 refills | Status: DC

## 2017-11-18 MED ORDER — GLIMEPIRIDE 4 MG PO TABS: ORAL_TABLET | ORAL | 6 refills | Status: DC

## 2017-11-18 MED ORDER — CARBAMIDE PEROXIDE 6.5 % OT SOLN: 5.0000 [drp] | Freq: Two times a day (BID) | OTIC | 1 refills | Status: DC

## 2017-11-18 MED FILL — ISOSORBIDE MN ER 30 MG TAB: 30 | 30 days supply | Qty: 30 | Fill #0

## 2017-11-18 MED FILL — ATORVASTATIN 80 MG TABLET: 80 | 30 days supply | Qty: 30 | Fill #0

## 2017-11-18 MED FILL — ?GLIMEPIRIDE 4 MG TABLET: 4 | 30 days supply | Qty: 60 | Fill #0

## 2017-11-18 MED FILL — ?FUROSEMIDE 20 MG TABLET: 20 | 30 days supply | Qty: 60 | Fill #0

## 2017-11-18 MED FILL — LISINOPRIL 40 MG TAB: 40 | 30 days supply | Qty: 30 | Fill #0

## 2017-11-18 MED FILL — metFORMIN HCL 500 MG TABS: 500 | 30 days supply | Qty: 120 | Fill #0

## 2017-11-18 NOTE — Patient Instructions (Signed)
Earwax Buildup, Adult The ears produce a substance called earwax that helps keep bacteria out of the ear and protects the skin in the ear canal. Occasionally, earwax can build up in the ear and cause discomfort or hearing loss. What increases the risk? This condition is more likely to develop in people who:  Are female.  Are elderly.  Naturally produce more earwax.  Clean their ears often with cotton swabs.  Use earplugs often.  Use in-ear headphones often.  Wear hearing aids.  Have narrow ear canals.  Have earwax that is overly thick or sticky.  Have eczema.  Are dehydrated.  Have excess hair in the ear canal.  What are the signs or symptoms? Symptoms of this condition include:  Reduced or muffled hearing.  A feeling of fullness in the ear or feeling that the ear is plugged.  Fluid coming from the ear.  Ear pain.  Ear itch.  Ringing in the ear.  Coughing.  An obvious piece of earwax that can be seen inside the ear canal.  How is this diagnosed? This condition may be diagnosed based on:  Your symptoms.  Your medical history.  An ear exam. During the exam, your health care provider will look into your ear with an instrument called an otoscope.  You may have tests, including a hearing test. How is this treated? This condition may be treated by:  Using ear drops to soften the earwax.  Having the earwax removed by a health care provider. The health care provider may: ? Flush the ear with water. ? Use an instrument that has a loop on the end (curette). ? Use a suction device.  Surgery to remove the wax buildup. This may be done in severe cases.  Follow these instructions at home:  Take over-the-counter and prescription medicines only as told by your health care provider.  Do not put any objects, including cotton swabs, into your ear. You can clean the opening of your ear canal with a washcloth or facial tissue.  Follow instructions from your health  care provider about cleaning your ears. Do not over-clean your ears.  Drink enough fluid to keep your urine clear or pale yellow. This will help to thin the earwax.  Keep all follow-up visits as told by your health care provider. If earwax builds up in your ears often or if you use hearing aids, consider seeing your health care provider for routine, preventive ear cleanings. Ask your health care provider how often you should schedule your cleanings.  If you have hearing aids, clean them according to instructions from the manufacturer and your health care provider. Contact a health care provider if:  You have ear pain.  You develop a fever.  You have blood, pus, or other fluid coming from your ear.  You have hearing loss.  You have ringing in your ears that does not go away.  Your symptoms do not improve with treatment.  You feel like the room is spinning (vertigo). Summary  Earwax can build up in the ear and cause discomfort or hearing loss.  The most common symptoms of this condition include reduced or muffled hearing and a feeling of fullness in the ear or feeling that the ear is plugged.  This condition may be diagnosed based on your symptoms, your medical history, and an ear exam.  This condition may be treated by using ear drops to soften the earwax or by having the earwax removed by a health care provider.  Do   not put any objects, including cotton swabs, into your ear. You can clean the opening of your ear canal with a washcloth or facial tissue. This information is not intended to replace advice given to you by your health care provider. Make sure you discuss any questions you have with your health care provider. Document Released: 01/09/2005 Document Revised: 02/12/2017 Document Reviewed: 02/12/2017 Elsevier Interactive Patient Education  2018 Elsevier Inc[employer]  

## 2017-11-18 NOTE — Progress Notes (Signed)
Subjective:  Patient ID: [name], female    DOB: [birth year]  Age: 61 y.o. MRN: [id number]  CC: Diabetes   HPI [name] is a 61 year old female with a history of type 2 diabetes mellitus (A1c 7.3), dyslipidemia, coronary artery disease status post DES stent (in 05/5783), diastolic heart failure (EF 65-70%, grade 1 DD from echo of 08/2015), essential hypertension who comes in today for a follow-up visit accompanied by her daughter.  At her last visit she was seen for an ED follow-up where she was seen for hearing loss referred to ENT whom she never saw due to inability to afford payment for the visit. I had commenced her on Zyrtec which has brought about slight improvement she states and symptoms are only present in her left ear.  She has been compliant with her diabetic medications and denies visual concerns, numbness in extremities or hypoglycemia. Compliant with her statin and denies myalgias. Doing well on her antihypertensives.  She denies chest pains, shortness of breath or pedal edema and her last appointment with cardiology was in 08/2017 and there were no changes made to her management.  Past Medical History:  Diagnosis Date  . Anginal pain ([place])   . Coronary atherosclerosis of native coronary artery, with stent to LCX in 2006 06/04/2013  . Diabetes mellitus without complication ([place])   . GERD (gastroesophageal reflux disease)   . Heart attack ([place])   . Hyperlipidemia LDL goal < 70 06/07/2013  . Hypertension   . S/P coronary artery stent placement to RCA with PROMUS DES, 06/07/13, residual disease on OM1, OM2 and rPDA 06/07/2013    Past Surgical History:  Procedure Laterality Date  . CARDIAC CATHETERIZATION N/A 08/22/2015   Procedure: Left Heart Cath and Coronary Angiography;  Surgeon: Leonie Man, MD;  Location: [place] [facility];  Service: Cardiovascular;  Laterality: N/A;  . CARDIAC CATHETERIZATION N/A 08/22/2015   Procedure: Coronary Stent Intervention;   Surgeon: Leonie Man, MD;  Location: [place] [facility];  Service: Cardiovascular;  Laterality: N/A;  . CARDIAC CATHETERIZATION N/A 08/22/2015   Procedure: Left Heart Cath and Coronary Angiography;  Surgeon: Leonie Man, MD;  Location: [place] [facility];  Service: Cardiovascular;  Laterality: N/A;  . CORONARY STENT PLACEMENT  2006   TAXUS stent CFX in [facility]  . LEFT HEART CATHETERIZATION WITH CORONARY ANGIOGRAM N/A 06/07/2013   Procedure: LEFT HEART CATHETERIZATION WITH CORONARY ANGIOGRAM;  Surgeon: Sanda Klein, MD;  Location: [place] [facility];  Service: Cardiovascular;  Laterality: N/A;    No Known Allergies   Outpatient Medications Prior to Visit  Medication Sig Dispense Refill  . acetaminophen (TYLENOL) 500 MG tablet Take 1,000 mg by mouth daily as needed for moderate pain.     Marland Kitchen aspirin EC 81 MG EC tablet Take 1 tablet (81 mg total) by mouth daily.    . nitroGLYCERIN (NITROSTAT) 0.4 MG SL tablet Place 1 tablet (0.4 mg total) under the tongue every 5 (five) minutes as needed for chest pain. 25 tablet 2  . ranitidine (ZANTAC) 150 MG capsule Take 150 mg by mouth daily as needed for heartburn.    Marland Kitchen atorvastatin (LIPITOR) 80 MG tablet TAKE 1 TABLET BY MOUTH DAILY AT 6 PM 30 tablet 6  . canagliflozin (INVOKANA) 100 MG TABS tablet Take 1 tablet (100 mg total) by mouth daily. 30 tablet 6  . cetirizine (ZYRTEC) 10 MG tablet Take 1 tablet (10 mg total) daily by mouth. 30 tablet 1  .  furosemide (LASIX) 20 MG tablet Take 2 tablets (40 mg total) by mouth daily. 60 tablet 6  . glimepiride (AMARYL) 4 MG tablet TAKE 1 TABLET BY MOUTH 2 TIMES DAILY WITH A MEAL. 60 tablet 6  . isosorbide mononitrate (IMDUR) 30 MG 24 hr tablet Take 1 tablet (30 mg total) by mouth daily. 30 tablet 6  . lisinopril (PRINIVIL,ZESTRIL) 40 MG tablet Take 1 tablet (40 mg total) by mouth daily. Schedule appointment for further refills 30 tablet 6  . metFORMIN (GLUCOPHAGE) 500 MG tablet Take 2 tablets (1,000 mg total) by  mouth 2 (two) times daily with a meal. 120 tablet 6  . metoprolol tartrate (LOPRESSOR) 100 MG tablet Take 1 tablet (100 mg total) by mouth 2 (two) times daily. 60 tablet 6   No facility-administered medications prior to visit.     ROS Review of Systems  Constitutional: Negative for activity change, appetite change and fatigue.  HENT: Negative for congestion, sinus pressure and sore throat.   Eyes: Negative for visual disturbance.  Respiratory: Negative for cough, chest tightness, shortness of breath and wheezing.   Cardiovascular: Negative for chest pain and palpitations.  Gastrointestinal: Negative for abdominal distention, abdominal pain and constipation.  Endocrine: Negative for polydipsia.  Genitourinary: Negative for dysuria and frequency.  Musculoskeletal: Negative for arthralgias and back pain.  Skin: Negative for rash.  Neurological: Negative for tremors, light-headedness and numbness.  Hematological: Does not bruise/bleed easily.  Psychiatric/Behavioral: Negative for agitation and behavioral problems.    Objective:  BP 130/80   Pulse 64   Temp 97.8 F (36.6 C) (Oral)   Ht 5\' 2"  (1.575 m)   Wt 198 lb 3.2 oz (89.9 kg)   SpO2 99%   BMI 36.25 kg/m   BP/Weight 11/18/2017 10/28/2017 22/29/7989  Systolic BP 211 941 740  Diastolic BP 80 85 93  Wt. (Lbs) 198.2 197 195  BMI 36.25 36.03 35.67      Physical Exam  Constitutional: She is oriented to person, place, and time. She appears well-developed and well-nourished.  HENT:  Cerumen partially obscuring EAC  Cardiovascular: Normal rate, normal heart sounds and intact distal pulses.  No murmur heard. Pulmonary/Chest: Effort normal and breath sounds normal. She has no wheezes. She has no rales. She exhibits no tenderness.  Abdominal: Soft. Bowel sounds are normal. She exhibits no distension and no mass. There is no tenderness.  Musculoskeletal: Normal range of motion.  Neurological: She is alert and oriented to person,  place, and time.  Skin: Skin is warm and dry.  Psychiatric: She has a normal mood and affect.      CMP Latest Ref Rng & Units 08/19/2017 05/14/2017 02/11/2017  Glucose 65 - 99 mg/dL 86 118(H) 158(H)  BUN 6 - 24 mg/dL 17 14 16   Creatinine 0.57 - 1.00 mg/dL 0.78 0.75 0.79  Sodium 134 - 144 mmol/L 143 146(H) 143  Potassium 3.5 - 5.2 mmol/L 4.1 3.9 3.6  Chloride 96 - 106 mmol/L 106 103 107  CO2 20 - 29 mmol/L 20 26 27   Calcium 8.7 - 10.2 mg/dL 9.8 9.5 9.6  Total Protein 6.0 - 8.5 g/dL - 6.5 7.0  Total Bilirubin 0.0 - 1.2 mg/dL - 0.3 0.6  Alkaline Phos 39 - 117 IU/L - 99 98  AST 0 - 40 IU/L - 14 14  ALT 0 - 32 IU/L - 18 13    Lipid Panel     Component Value Date/Time   CHOL 110 02/11/2017 1002   TRIG 90 02/11/2017  1002   HDL 46 (L) 02/11/2017 1002   CHOLHDL 2.4 02/11/2017 1002   VLDL 18 02/11/2017 1002   LDLCALC 46 02/11/2017 1002     Lab Results  Component Value Date   HGBA1C 7.3 08/19/2017    Assessment & Plan:   1. Type 2 diabetes mellitus with other specified complication, without long-term current use of insulin (HCC) Last A1c was 7.3 Repeat today ADA diet, lifestyle modifications - POCT glucose (manual entry) - Hemoglobin A1c - canagliflozin (INVOKANA) 100 MG TABS tablet; Take 1 tablet (100 mg total) by mouth daily.  Dispense: 30 tablet; Refill: 6 - glimepiride (AMARYL) 4 MG tablet; TAKE 1 TABLET BY MOUTH 2 TIMES DAILY WITH A MEAL.  Dispense: 60 tablet; Refill: 6 - metFORMIN (GLUCOPHAGE) 500 MG tablet; Take 2 tablets (1,000 mg total) by mouth 2 (two) times daily with a meal.  Dispense: 120 tablet; Refill: 6  2. Dyslipidemia Controlled Low-cholesterol diet - atorvastatin (LIPITOR) 80 MG tablet; TAKE 1 TABLET BY MOUTH DAILY AT 6 PM  Dispense: 30 tablet; Refill: 6  3. 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  Dispense: 30 tablet;  Refill: 1  4. Chronic diastolic heart failure (HCC) EF 65-70%, grade 1 DD from echo of 08/2015 Euvolemic - furosemide (LASIX) 20 MG tablet; Take 2 tablets (40 mg total) by mouth daily.  Dispense: 60 tablet; Refill: 6 - isosorbide mononitrate (IMDUR) 30 MG 24 hr tablet; Take 1 tablet (30 mg total) by mouth daily.  Dispense: 30 tablet; Refill: 6  5. Essential hypertension Controlled - lisinopril (PRINIVIL,ZESTRIL) 40 MG tablet; Take 1 tablet (40 mg total) by mouth daily.  Dispense: 30 tablet; Refill: 6 - metoprolol tartrate (LOPRESSOR) 100 MG tablet; Take 1 tablet (100 mg total) by mouth 2 (two) times daily.  Dispense: 60 tablet; Refill: 6  6. Bilateral impacted cerumen - carbamide peroxide (DEBROX) 6.5 % OTIC solution; Place 5 drops into both ears 2 (two) times daily.  Dispense: 15 mL; Refill: 1   Meds ordered this encounter  Medications  . carbamide peroxide (DEBROX) 6.5 % OTIC solution    Sig: Place 5 drops into both ears 2 (two) times daily.    Dispense:  15 mL    Refill:  1  . atorvastatin (LIPITOR) 80 MG tablet    Sig: TAKE 1 TABLET BY MOUTH DAILY AT 6 PM    Dispense:  30 tablet    Refill:  6  . canagliflozin (INVOKANA) 100 MG TABS tablet    Sig: Take 1 tablet (100 mg total) by mouth daily.    Dispense:  30 tablet    Refill:  6  . cetirizine (ZYRTEC) 10 MG tablet    Sig: Take 1 tablet (10 mg total) by mouth daily.    Dispense:  30 tablet    Refill:  1  . furosemide (LASIX) 20 MG tablet    Sig: Take 2 tablets (40 mg total) by mouth daily.    Dispense:  60 tablet    Refill:  6  . glimepiride (AMARYL) 4 MG tablet    Sig: TAKE 1 TABLET BY MOUTH 2 TIMES DAILY WITH A MEAL.    Dispense:  60 tablet    Refill:  6  . isosorbide mononitrate (IMDUR) 30 MG 24 hr tablet    Sig: Take 1 tablet (30 mg total) by mouth daily.  Dispense:  30 tablet    Refill:  6  . lisinopril (PRINIVIL,ZESTRIL) 40 MG tablet    Sig: Take 1 tablet (40 mg total) by mouth daily.    Dispense:  30 tablet      Refill:  6  . metFORMIN (GLUCOPHAGE) 500 MG tablet    Sig: Take 2 tablets (1,000 mg total) by mouth 2 (two) times daily with a meal.    Dispense:  120 tablet    Refill:  6  . metoprolol tartrate (LOPRESSOR) 100 MG tablet    Sig: Take 1 tablet (100 mg total) by mouth 2 (two) times daily.    Dispense:  60 tablet    Refill:  6    Follow-up: Return in about 3 months (around 02/16/2018) for follow up on Diabetes mellitus.   Arnoldo Morale MD

## 2017-11-19 LAB — HEMOGLOBIN A1C
Est. average glucose Bld gHb Est-mCnc: 177 mg/dL
Hgb A1c MFr Bld: 7.8 % — ABNORMAL HIGH (ref 4.8–5.6)

## 2017-12-11 MED FILL — INVOKANA 100 MG TABLET: 100 | 30 days supply | Qty: 30 | Fill #6

## 2017-12-11 MED FILL — ?CETIRIZINE HCL 10 MG TABLE: 10 | 30 days supply | Qty: 30 | Fill #1

## 2017-12-29 MED FILL — ?FUROSEMIDE 20 MG TABLET: 20 | 30 days supply | Qty: 60 | Fill #1

## 2017-12-29 MED FILL — ?ISOSORBIDE MN 30 MG TAB SA: 30 | 30 days supply | Qty: 30 | Fill #1

## 2017-12-29 MED FILL — ?GLIMEPIRIDE 4 MG TABLET: 4 | 30 days supply | Qty: 60 | Fill #1

## 2017-12-29 MED FILL — LISINOPRIL 40 MG TAB: 40 | 30 days supply | Qty: 30 | Fill #1

## 2017-12-29 MED FILL — ATORVASTATIN 80 MG TABLET: 80 | 30 days supply | Qty: 30 | Fill #1

## 2018-01-08 MED FILL — INVOKANA 100 MG TABLET: 100 | 30 days supply | Qty: 30 | Fill #0

## 2018-01-08 MED FILL — ?CETIRIZINE HCL 10 MG TABLE: 10 | 30 days supply | Qty: 30 | Fill #0

## 2018-01-28 MED FILL — ATORVASTATIN 80 MG TABLET: 80 | 30 days supply | Qty: 30 | Fill #2

## 2018-01-28 MED FILL — ?GLIMEPIRIDE 4 MG TABLET: 4 | 30 days supply | Qty: 60 | Fill #2

## 2018-01-28 MED FILL — LISINOPRIL 40 MG TAB: 40 | 30 days supply | Qty: 30 | Fill #2

## 2018-01-28 MED FILL — ?FUROSEMIDE 20 MG TABLET: 20 | 30 days supply | Qty: 60 | Fill #2

## 2018-01-28 MED FILL — ISOSORBIDE MN ER 30 MG TAB: 30 | 30 days supply | Qty: 30 | Fill #2

## 2018-02-13 MED FILL — INVOKANA 100 MG TABLET: 100 | 30 days supply | Qty: 30 | Fill #1

## 2018-02-13 MED FILL — ?CETIRIZINE HCL 10 MG TABLE: 10 | 30 days supply | Qty: 30 | Fill #1

## 2018-02-13 MED FILL — metFORMIN HCL 500 MG TABS: 500 | 30 days supply | Qty: 120 | Fill #1

## 2018-02-17 ENCOUNTER — Encounter: Payer: Self-pay | Admitting: Family Medicine

## 2018-02-17 ENCOUNTER — Ambulatory Visit: Payer: Self-pay | Attending: Family Medicine | Admitting: Family Medicine

## 2018-02-17 VITALS — BP 141/87 | HR 109 | Temp 98.0°F | Ht 62.0 in | Wt 196.8 lb

## 2018-02-17 DIAGNOSIS — E785 Hyperlipidemia, unspecified: Secondary | ICD-10-CM | POA: Insufficient documentation

## 2018-02-17 DIAGNOSIS — K219 Gastro-esophageal reflux disease without esophagitis: Secondary | ICD-10-CM | POA: Insufficient documentation

## 2018-02-17 DIAGNOSIS — R6889 Other general symptoms and signs: Secondary | ICD-10-CM

## 2018-02-17 DIAGNOSIS — I252 Old myocardial infarction: Secondary | ICD-10-CM | POA: Insufficient documentation

## 2018-02-17 DIAGNOSIS — Z7984 Long term (current) use of oral hypoglycemic drugs: Secondary | ICD-10-CM | POA: Insufficient documentation

## 2018-02-17 DIAGNOSIS — I2510 Atherosclerotic heart disease of native coronary artery without angina pectoris: Secondary | ICD-10-CM | POA: Insufficient documentation

## 2018-02-17 DIAGNOSIS — E11649 Type 2 diabetes mellitus with hypoglycemia without coma: Secondary | ICD-10-CM | POA: Insufficient documentation

## 2018-02-17 DIAGNOSIS — Z7982 Long term (current) use of aspirin: Secondary | ICD-10-CM | POA: Insufficient documentation

## 2018-02-17 DIAGNOSIS — Z955 Presence of coronary angioplasty implant and graft: Secondary | ICD-10-CM | POA: Insufficient documentation

## 2018-02-17 DIAGNOSIS — Z79899 Other long term (current) drug therapy: Secondary | ICD-10-CM | POA: Insufficient documentation

## 2018-02-17 DIAGNOSIS — I5032 Chronic diastolic (congestive) heart failure: Secondary | ICD-10-CM | POA: Insufficient documentation

## 2018-02-17 DIAGNOSIS — I10 Essential (primary) hypertension: Secondary | ICD-10-CM

## 2018-02-17 DIAGNOSIS — I110 Hypertensive heart disease with heart failure: Secondary | ICD-10-CM | POA: Insufficient documentation

## 2018-02-17 LAB — GLUCOSE, POCT (MANUAL RESULT ENTRY): POC Glucose: 151 mg/dl — AB (ref 70–99)

## 2018-02-17 LAB — POCT GLYCOSYLATED HEMOGLOBIN (HGB A1C): Hemoglobin A1C: 8

## 2018-02-17 MED ORDER — OSELTAMIVIR PHOSPHATE 75 MG PO CAPS: 75.0000 mg | ORAL_CAPSULE | Freq: Two times a day (BID) | ORAL | 0 refills | Status: DC

## 2018-02-17 MED FILL — TAMIFLU 75 MG GELCAP: 75 | 5 days supply | Qty: 10 | Fill #0

## 2018-02-17 NOTE — Patient Instructions (Signed)

## 2018-02-17 NOTE — Progress Notes (Signed)
Subjective:  Patient ID: [name], female    DOB: 1956/02/02  Age: 62 y.o. MRN: [id number]  CC: Diabetes   HPI [name] is a 62 year old female with a history of type 2 diabetes mellitus (A1c 8.0), dyslipidemia, coronary artery disease status post DES stent (in 02/7168), diastolic heart failure (EF 65-70%, grade 1 DD from echo of 08/2015), essential hypertension who comes in today for a follow-up visit accompanied by her daughter.  She complains of a 4-day history of cough which is dry, alternating hot and cold sensations, diarrhea, decreased appetite and she felt warm to the touch but never actually took her temperature with a thermometer.  She denies a history of sick contacts but endorses myalgias and malaise which have caused her to be out of work. She has used OTC analgesics with minimal relief  Her A1c is 8.0 and has trended up from 7.3 previously and she endorses dietary indiscretion not exercising but has been compliant with her medications. Tolerates her antihypertensive and her statin and denies adverse effects from medications She is doing well from a cardiac standpoint and denies shortness of breath, orthopnea or pedal edema.  Past Medical History:  Diagnosis Date  . Anginal pain ([place])   . Coronary atherosclerosis of native coronary artery, with stent to LCX in 2006 06/04/2013  . Diabetes mellitus without complication ([place])   . GERD (gastroesophageal reflux disease)   . Heart attack ([place])   . Hyperlipidemia LDL goal < 70 06/07/2013  . Hypertension   . S/P coronary artery stent placement to RCA with PROMUS DES, 06/07/13, residual disease on OM1, OM2 and rPDA 06/07/2013    Past Surgical History:  Procedure Laterality Date  . CARDIAC CATHETERIZATION N/A 08/22/2015   Procedure: Left Heart Cath and Coronary Angiography;  Surgeon: Leonie Man, MD;  Location: [facility];  Service: Cardiovascular;  Laterality: N/A;  . CARDIAC CATHETERIZATION N/A 08/22/2015   Procedure: Coronary Stent Intervention;  Surgeon: Leonie Man, MD;  Location: [facility];  Service: Cardiovascular;  Laterality: N/A;  . CARDIAC CATHETERIZATION N/A 08/22/2015   Procedure: Left Heart Cath and Coronary Angiography;  Surgeon: Leonie Man, MD;  Location: [facility];  Service: Cardiovascular;  Laterality: N/A;  . CORONARY STENT PLACEMENT  2006   TAXUS stent CFX in [facility]  . LEFT HEART CATHETERIZATION WITH CORONARY ANGIOGRAM N/A 06/07/2013   Procedure: LEFT HEART CATHETERIZATION WITH CORONARY ANGIOGRAM;  Surgeon: Sanda Klein, MD;  Location: [facility];  Service: Cardiovascular;  Laterality: N/A;    No Known Allergies   Outpatient Medications Prior to Visit  Medication Sig Dispense Refill  . acetaminophen (TYLENOL) 500 MG tablet Take 1,000 mg by mouth daily as needed for moderate pain.     Marland Kitchen aspirin EC 81 MG EC tablet Take 1 tablet (81 mg total) by mouth daily.    Marland Kitchen atorvastatin (LIPITOR) 80 MG tablet TAKE 1 TABLET BY MOUTH DAILY AT 6 PM 30 tablet 6  . canagliflozin (INVOKANA) 100 MG TABS tablet Take 1 tablet (100 mg total) by mouth daily. 30 tablet 6  . carbamide peroxide (DEBROX) 6.5 % OTIC solution Place 5 drops into both ears 2 (two) times daily. 15 mL 1  . cetirizine (ZYRTEC) 10 MG tablet Take 1 tablet (10 mg total) by mouth daily. 30 tablet 1  . furosemide (LASIX) 20 MG tablet Take 2 tablets (40 mg total) by mouth daily. 60 tablet 6  . glimepiride (AMARYL) 4  MG tablet TAKE 1 TABLET BY MOUTH 2 TIMES DAILY WITH A MEAL. 60 tablet 6  . isosorbide mononitrate (IMDUR) 30 MG 24 hr tablet Take 1 tablet (30 mg total) by mouth daily. 30 tablet 6  . lisinopril (PRINIVIL,ZESTRIL) 40 MG tablet Take 1 tablet (40 mg total) by mouth daily. 30 tablet 6  . metFORMIN (GLUCOPHAGE) 500 MG tablet Take 2 tablets (1,000 mg total) by mouth 2 (two) times daily with a meal. 120 tablet 6  . metoprolol tartrate (LOPRESSOR) 100 MG tablet Take 1 tablet (100 mg total) by mouth 2  (two) times daily. 60 tablet 6  . nitroGLYCERIN (NITROSTAT) 0.4 MG SL tablet Place 1 tablet (0.4 mg total) under the tongue every 5 (five) minutes as needed for chest pain. 25 tablet 2  . ranitidine (ZANTAC) 150 MG capsule Take 150 mg by mouth daily as needed for heartburn.     No facility-administered medications prior to visit.     ROS Review of Systems  Constitutional: Positive for appetite change and chills. Negative for activity change and fatigue.  HENT: Negative for congestion, sinus pressure and sore throat.   Eyes: Negative for visual disturbance.  Respiratory: Positive for cough. Negative for chest tightness, shortness of breath and wheezing.   Cardiovascular: Negative for chest pain and palpitations.  Gastrointestinal: Negative for abdominal distention, abdominal pain and constipation.  Endocrine: Negative for polydipsia.  Genitourinary: Negative for dysuria and frequency.  Musculoskeletal: Positive for myalgias. Negative for arthralgias and back pain.  Skin: Negative for rash.  Neurological: Negative for tremors, light-headedness and numbness.  Hematological: Does not bruise/bleed easily.  Psychiatric/Behavioral: Negative for agitation and behavioral problems.    Objective:  BP (!) 141/87   Pulse (!) 109   Temp 98 F (36.7 C) (Oral)   Ht 5' 2" (1.575 m)   Wt 196 lb 12.8 oz (89.3 kg)   SpO2 96%   BMI 36.00 kg/m   BP/Weight 02/17/2018 11/18/2017 63/78/5885  Systolic BP 027 741 287  Diastolic BP 87 80 85  Wt. (Lbs) 196.8 198.2 197  BMI 36 36.25 36.03      Physical Exam  Constitutional: She is oriented to person, place, and time. She appears well-developed and well-nourished.  Cardiovascular: Normal heart sounds and intact distal pulses. Tachycardia present.  No murmur heard. Pulmonary/Chest: Effort normal and breath sounds normal. She has no wheezes. She has no rales. She exhibits no tenderness.  Abdominal: Soft. Bowel sounds are normal. She exhibits no  distension and no mass. There is no tenderness.  Musculoskeletal: Normal range of motion.  Neurological: She is alert and oriented to person, place, and time.  Skin: Skin is warm and dry.  Psychiatric: She has a normal mood and affect.    CMP Latest Ref Rng & Units 08/19/2017 05/14/2017 02/11/2017  Glucose 65 - 99 mg/dL 86 118(H) 158(H)  BUN 6 - 24 mg/dL _0 Creatinine 0.57 - 1.00 mg/dL 0.78 0.75 0.79  Sodium 134 - 144 mmol/L 143 146(H) 143  Potassium 3.5 - 5.2 mmol/L 4.1 3.9 3.6  Chloride 96 - 106 mmol/L 106 103 107  CO2 20 - 29 mmol/L _1 Calcium 8.7 - 10.2 mg/dL 9.8 9.5 9.6  Total Protein 6.0 - 8.5 g/dL - 6.5 7.0  Total Bilirubin 0.0 - 1.2 mg/dL - 0.3 0.6  Alkaline Phos 39 - 117 IU/L - 99 98  AST 0 - 40 IU/L - 14 14  ALT 0 - 32 IU/L - 18 13  Lipid Panel     Component Value Date/Time   CHOL 110 02/11/2017 1002   TRIG 90 02/11/2017 1002   HDL 46 (L) 02/11/2017 1002   CHOLHDL 2.4 02/11/2017 1002   VLDL 18 02/11/2017 1002   LDLCALC 46 02/11/2017 1002    Lab Results  Component Value Date   HGBA1C 8.0 02/17/2018     Assessment & Plan:   1. Uncontrolled type 2 diabetes mellitus with hypoglycemia, unspecified hypoglycemia coma status ([place]) Uncontrolled with A1c of 8.0 which has trended up from 7.3 previously due to dietary indiscretion She is motivated to work on her lifestyle modifications-no medication changes for now Counseled on Diabetic diet, my plate method, 283 minutes of moderate intensity exercise/week Keep blood sugar logs with fasting goals of 80-120 mg/dl, random of less than 180 and in the event of sugars less than 60 mg/dl or greater than 400 mg/dl please notify the clinic ASAP. It is recommended that you undergo annual eye exams and annual foot exams. Pneumonia vaccine is recommended. - POCT glucose (manual entry) - POCT glycosylated hemoglobin (Hb A1C) - CMP14+EGFR - Lipid panel - Microalbumin/Creatinine Ratio, Urine  2. Flu-like symptoms -  oseltamivir (TAMIFLU) 75 MG capsule; Take 1 capsule (75 mg total) by mouth 2 (two) times daily.  Dispense: 10 capsule; Refill: 0  3. Chronic diastolic CHF (congestive heart failure) (HCC) EF 65-70% from echo of 08/2015 Euvolemic Continue medications  4. Essential hypertension Slightly elevated Counseled on blood pressure goal of less than 130/80, low-sodium, DASH diet, medication compliance, 150 minutes of moderate intensity exercise per week. Discussed medication compliance, adverse effects. Continue lisinopril, isosorbide  5. Dyslipidemia Controlled Continue statin   Meds ordered this encounter  Medications  . oseltamivir (TAMIFLU) 75 MG capsule    Sig: Take 1 capsule (75 mg total) by mouth 2 (two) times daily.    Dispense:  10 capsule    Refill:  0    Follow-up: Return in about 3 months (around 05/20/2018) for Follow-up of chronic medical conditions.   [name] MD

## 2018-02-18 LAB — MICROALBUMIN / CREATININE URINE RATIO
Creatinine, Urine: 40.8 mg/dL
MICROALBUM., U, RANDOM: 125.5 ug/mL
Microalb/Creat Ratio: 307.6 mg/g creat — ABNORMAL HIGH (ref 0.0–30.0)

## 2018-02-18 LAB — CMP14+EGFR
ALBUMIN: 4.3 g/dL (ref 3.5–5.5)
ALK PHOS: 117 IU/L (ref 39–117)
ALT: 35 IU/L — AB (ref 0–32)
AST: 30 IU/L (ref 0–40)
Albumin/Globulin Ratio: 1.4 (ref 1.2–2.2)
BUN / CREAT RATIO: 21 (ref 9–23)
BUN: 20 mg/dL (ref 6–24)
Bilirubin Total: 0.3 mg/dL (ref 0.0–1.2)
CHLORIDE: 102 mmol/L (ref 96–106)
CO2: 21 mmol/L (ref 20–29)
CREATININE: 0.95 mg/dL (ref 0.57–1.00)
Calcium: 9.1 mg/dL (ref 8.7–10.2)
GFR calc Af Amer: 76 mL/min/{1.73_m2} (ref >59)
GFR calc non Af Amer: 66 mL/min/{1.73_m2} (ref >59)
GLUCOSE: 163 mg/dL — AB (ref 65–99)
Globulin, Total: 3 g/dL (ref 1.5–4.5)
Potassium: 3.8 mmol/L (ref 3.5–5.2)
Sodium: 141 mmol/L (ref 134–144)
Total Protein: 7.3 g/dL (ref 6.0–8.5)

## 2018-02-18 LAB — LIPID PANEL
Chol/HDL Ratio: 3.1 ratio (ref 0.0–4.4)
Cholesterol, Total: 129 mg/dL (ref 100–199)
HDL: 42 mg/dL (ref >39)
LDL CALC: 66 mg/dL (ref 0–99)
Triglycerides: 103 mg/dL (ref 0–149)
VLDL CHOLESTEROL CAL: 21 mg/dL (ref 5–40)

## 2018-02-20 LAB — RESPIRATORY VIRUS PANEL
Adenovirus: NEGATIVE
Influenza A: NEGATIVE
Influenza B: NEGATIVE
METAPNEUMOVIRUS: NEGATIVE
PARAINFLUENZA 2 A: NEGATIVE
Parainfluenza 1: NEGATIVE
Parainfluenza 3: NEGATIVE
RHINOVIRUS: NEGATIVE
Respiratory Syncytial Virus A: NEGATIVE
Respiratory Syncytial Virus B: NEGATIVE

## 2018-02-27 ENCOUNTER — Ambulatory Visit: Payer: Self-pay

## 2018-02-27 MED FILL — ?GLIMEPIRIDE 4 MG TABLET: 4 | 30 days supply | Qty: 60 | Fill #3

## 2018-02-27 MED FILL — LISINOPRIL 40 MG TAB: 40 | 30 days supply | Qty: 30 | Fill #3

## 2018-02-27 MED FILL — ?FUROSEMIDE 20 MG TABLET: 20 | 30 days supply | Qty: 60 | Fill #3

## 2018-02-27 MED FILL — ATORVASTATIN 80 MG TABLET: 80 | 30 days supply | Qty: 30 | Fill #3

## 2018-02-27 MED FILL — ISOSORBIDE MN ER 30 MG TAB: 30 | 30 days supply | Qty: 30 | Fill #3

## 2018-03-10 MED FILL — metFORMIN HCL 500 MG TABS: 500 | 30 days supply | Qty: 120 | Fill #2

## 2018-03-20 MED FILL — INVOKANA 100 MG TABLET: 100 | 30 days supply | Qty: 30 | Fill #2

## 2018-04-06 MED FILL — ?GLIMEPIRIDE 4 MG TABLET: 4 | 30 days supply | Qty: 60 | Fill #4

## 2018-04-06 MED FILL — ATORVASTATIN 80 MG TABLET: 80 | 30 days supply | Qty: 30 | Fill #4

## 2018-04-06 MED FILL — ISOSORBIDE MN ER 30 MG TAB: 30 | 30 days supply | Qty: 30 | Fill #4

## 2018-04-06 MED FILL — ?FUROSEMIDE 20 MG TABLET: 20 | 30 days supply | Qty: 60 | Fill #4

## 2018-04-06 MED FILL — LISINOPRIL 40 MG TABLET: 40 | 30 days supply | Qty: 30 | Fill #4

## 2018-04-10 MED FILL — metFORMIN HCL 500 MG TABS: 500 | 30 days supply | Qty: 120 | Fill #3

## 2018-04-21 MED FILL — INVOKANA 100 MG TABLET: 100 | 30 days supply | Qty: 30 | Fill #3

## 2018-05-04 MED FILL — LISINOPRIL 40 MG TABLET: 40 | 30 days supply | Qty: 30 | Fill #5

## 2018-05-04 MED FILL — ?FUROSEMIDE 20 MG TABLET: 20 | 30 days supply | Qty: 60 | Fill #5

## 2018-05-04 MED FILL — ISOSORBIDE MN ER 30 MG TAB: 30 | 30 days supply | Qty: 30 | Fill #5

## 2018-05-05 MED FILL — ATORVASTATIN 80 MG TABLET: 80 | 30 days supply | Qty: 30 | Fill #5

## 2018-05-12 MED FILL — ?GLIMEPIRIDE 4 MG TABLET: 4 | 30 days supply | Qty: 60 | Fill #5

## 2018-05-12 MED FILL — metFORMIN HCL 500 MG TABS: 500 | 30 days supply | Qty: 120 | Fill #4

## 2018-05-19 ENCOUNTER — Ambulatory Visit: Payer: Self-pay | Admitting: Family Medicine

## 2018-05-19 ENCOUNTER — Telehealth: Payer: Self-pay | Admitting: *Deleted

## 2018-05-19 NOTE — Telephone Encounter (Signed)
Error

## 2018-05-21 MED FILL — INVOKANA 100 MG TABLET: 100 | 30 days supply | Qty: 30 | Fill #4

## 2018-06-04 MED FILL — LISINOPRIL 40 MG TABLET: 40 | 30 days supply | Qty: 30 | Fill #6

## 2018-06-04 MED FILL — ISOSORBIDE MN ER 30 MG TAB: 30 | 30 days supply | Qty: 30 | Fill #6

## 2018-06-04 MED FILL — ?METFORMIN HCL 500MG TABLET: 500 | 30 days supply | Qty: 120 | Fill #5

## 2018-06-04 MED FILL — ATORVASTATIN 80 MG TABLET: 80 | 30 days supply | Qty: 30 | Fill #6

## 2018-06-11 MED FILL — ?FUROSEMIDE 20 MG TABLET: 20 | 30 days supply | Qty: 60 | Fill #6

## 2018-06-15 ENCOUNTER — Telehealth: Payer: Self-pay | Admitting: Family Medicine

## 2018-06-15 DIAGNOSIS — I5032 Chronic diastolic (congestive) heart failure: Secondary | ICD-10-CM

## 2018-06-15 DIAGNOSIS — I10 Essential (primary) hypertension: Secondary | ICD-10-CM

## 2018-06-15 DIAGNOSIS — E1169 Type 2 diabetes mellitus with other specified complication: Secondary | ICD-10-CM

## 2018-06-15 DIAGNOSIS — E785 Hyperlipidemia, unspecified: Secondary | ICD-10-CM

## 2018-06-15 MED ORDER — CANAGLIFLOZIN 100 MG PO TABS: 100.0000 mg | ORAL_TABLET | Freq: Every day | ORAL | 0 refills | Status: DC

## 2018-06-15 MED ORDER — ATORVASTATIN CALCIUM 80 MG PO TABS: ORAL_TABLET | ORAL | 0 refills | Status: DC

## 2018-06-15 MED ORDER — LISINOPRIL 40 MG PO TABS: 40.0000 mg | ORAL_TABLET | Freq: Every day | ORAL | 0 refills | Status: DC

## 2018-06-15 MED ORDER — ISOSORBIDE MONONITRATE ER 30 MG PO TB24: 30.0000 mg | ORAL_TABLET | Freq: Every day | ORAL | 0 refills | Status: DC

## 2018-06-15 MED ORDER — FUROSEMIDE 20 MG PO TABS: 40.0000 mg | ORAL_TABLET | Freq: Every day | ORAL | 0 refills | Status: DC

## 2018-06-15 MED ORDER — GLIMEPIRIDE 4 MG PO TABS: ORAL_TABLET | ORAL | 0 refills | Status: DC

## 2018-06-15 MED ORDER — METFORMIN HCL 500 MG PO TABS: 1000.0000 mg | ORAL_TABLET | Freq: Two times a day (BID) | ORAL | 0 refills | Status: DC

## 2018-06-15 MED FILL — GLIMEPIRIDE 4 MG TABLET: 4 | 30 days supply | Qty: 60 | Fill #0

## 2018-06-15 MED FILL — INVOKANA 100 MG TABLET: 100 | 30 days supply | Qty: 30 | Fill #0

## 2018-06-15 NOTE — Telephone Encounter (Signed)
Patient called requesting a refill on metFORMIN, lisinopril, isosorbide mononitrate,atorvastatin (LIPITOR),glimepiride (AMARYL,furosemide (LASIX),  and canagliflozin [facility]) she missed her appointment and she needs a one month fill to get her to aug 6 which is her appointment. Please let me know if you can or cannot fill so I can call the patient. If you can fill send it to [facility]

## 2018-07-02 MED FILL — LISINOPRIL 40 MG TABLET: 40 | 30 days supply | Qty: 30 | Fill #0

## 2018-07-02 MED FILL — ?FUROSEMIDE 20 MG TABLET: 20 | 30 days supply | Qty: 60 | Fill #0

## 2018-07-02 MED FILL — ATORVASTATIN 80 MG TABLET: 80 | 30 days supply | Qty: 30 | Fill #0

## 2018-07-02 MED FILL — ?METFORMIN HCL 500MG TABS: 500 | 30 days supply | Qty: 120 | Fill #6

## 2018-07-16 MED FILL — ISOSORBIDE MN ER 30 MG TAB: 30 | 30 days supply | Qty: 30 | Fill #3

## 2018-07-18 ENCOUNTER — Inpatient Hospital Stay (HOSPITAL_COMMUNITY)
Admission: EM | Admit: 2018-07-18 | Discharge: 2018-07-21 | DRG: 287 | Disposition: A | Discharge status: Home / Self Care | Payer: Self-pay | Attending: Internal Medicine | Admitting: Internal Medicine

## 2018-07-18 ENCOUNTER — Encounter (HOSPITAL_COMMUNITY): Payer: Self-pay | Admitting: Emergency Medicine

## 2018-07-18 ENCOUNTER — Emergency Department (HOSPITAL_COMMUNITY): Payer: Self-pay

## 2018-07-18 DIAGNOSIS — I252 Old myocardial infarction: Secondary | ICD-10-CM

## 2018-07-18 DIAGNOSIS — R600 Localized edema: Secondary | ICD-10-CM

## 2018-07-18 DIAGNOSIS — Z79899 Other long term (current) drug therapy: Secondary | ICD-10-CM

## 2018-07-18 DIAGNOSIS — R079 Chest pain, unspecified: Secondary | ICD-10-CM

## 2018-07-18 DIAGNOSIS — Z9861 Coronary angioplasty status: Secondary | ICD-10-CM

## 2018-07-18 DIAGNOSIS — Z7984 Long term (current) use of oral hypoglycemic drugs: Secondary | ICD-10-CM

## 2018-07-18 DIAGNOSIS — Z955 Presence of coronary angioplasty implant and graft: Secondary | ICD-10-CM

## 2018-07-18 DIAGNOSIS — I10 Essential (primary) hypertension: Secondary | ICD-10-CM | POA: Diagnosis present

## 2018-07-18 DIAGNOSIS — I119 Hypertensive heart disease without heart failure: Secondary | ICD-10-CM | POA: Diagnosis present

## 2018-07-18 DIAGNOSIS — E785 Hyperlipidemia, unspecified: Secondary | ICD-10-CM | POA: Diagnosis present

## 2018-07-18 DIAGNOSIS — I110 Hypertensive heart disease with heart failure: Secondary | ICD-10-CM | POA: Diagnosis present

## 2018-07-18 DIAGNOSIS — I5032 Chronic diastolic (congestive) heart failure: Secondary | ICD-10-CM | POA: Diagnosis present

## 2018-07-18 DIAGNOSIS — R072 Precordial pain: Secondary | ICD-10-CM

## 2018-07-18 DIAGNOSIS — E1165 Type 2 diabetes mellitus with hyperglycemia: Secondary | ICD-10-CM | POA: Diagnosis present

## 2018-07-18 DIAGNOSIS — Z7982 Long term (current) use of aspirin: Secondary | ICD-10-CM

## 2018-07-18 DIAGNOSIS — I25119 Atherosclerotic heart disease of native coronary artery with unspecified angina pectoris: Principal | ICD-10-CM | POA: Diagnosis present

## 2018-07-18 DIAGNOSIS — Z87891 Personal history of nicotine dependence: Secondary | ICD-10-CM

## 2018-07-18 DIAGNOSIS — Z6836 Body mass index (BMI) 36.0-36.9, adult: Secondary | ICD-10-CM

## 2018-07-18 DIAGNOSIS — Z23 Encounter for immunization: Secondary | ICD-10-CM

## 2018-07-18 DIAGNOSIS — I2510 Atherosclerotic heart disease of native coronary artery without angina pectoris: Secondary | ICD-10-CM

## 2018-07-18 DIAGNOSIS — E669 Obesity, unspecified: Secondary | ICD-10-CM | POA: Diagnosis present

## 2018-07-18 DIAGNOSIS — E875 Hyperkalemia: Secondary | ICD-10-CM | POA: Diagnosis present

## 2018-07-18 DIAGNOSIS — E1169 Type 2 diabetes mellitus with other specified complication: Secondary | ICD-10-CM

## 2018-07-18 DIAGNOSIS — E11649 Type 2 diabetes mellitus with hypoglycemia without coma: Secondary | ICD-10-CM

## 2018-07-18 DIAGNOSIS — K219 Gastro-esophageal reflux disease without esophagitis: Secondary | ICD-10-CM | POA: Diagnosis present

## 2018-07-18 LAB — BASIC METABOLIC PANEL
ANION GAP: 11 (ref 5–15)
BUN: 26 mg/dL — ABNORMAL HIGH (ref 6–20)
CALCIUM: 9.3 mg/dL (ref 8.9–10.3)
CHLORIDE: 102 mmol/L (ref 98–111)
CO2: 24 mmol/L (ref 22–32)
Creatinine, Ser: 0.95 mg/dL (ref 0.44–1.00)
GFR calc non Af Amer: >60 mL/min (ref >60)
Glucose, Bld: 216 mg/dL — ABNORMAL HIGH (ref 70–99)
Potassium: 5.3 mmol/L — ABNORMAL HIGH (ref 3.5–5.1)
Sodium: 137 mmol/L (ref 135–145)

## 2018-07-18 LAB — GLUCOSE, CAPILLARY: Glucose-Capillary: 200 mg/dL — ABNORMAL HIGH (ref 70–99)

## 2018-07-18 LAB — I-STAT BETA HCG BLOOD, ED (MC, WL, AP ONLY)

## 2018-07-18 LAB — I-STAT TROPONIN, ED: Troponin i, poc: 0.05 ng/mL (ref 0.00–0.08)

## 2018-07-18 LAB — D-DIMER, QUANTITATIVE: D-Dimer, Quant: <0.27 ug/mL-FEU (ref 0.00–0.50)

## 2018-07-18 LAB — CBC
HCT: 45.8 % (ref 36.0–46.0)
HEMOGLOBIN: 14.9 g/dL (ref 12.0–15.0)
MCH: 30.4 pg (ref 26.0–34.0)
MCHC: 32.5 g/dL (ref 30.0–36.0)
MCV: 93.5 fL (ref 78.0–100.0)
PLATELETS: 260 10*3/uL (ref 150–400)
RBC: 4.9 MIL/uL (ref 3.87–5.11)
RDW: 13.3 % (ref 11.5–15.5)
WBC: 6.6 10*3/uL (ref 4.0–10.5)

## 2018-07-18 LAB — TROPONIN I: Troponin I: 0.05 ng/mL (ref <0.03)

## 2018-07-18 LAB — CBG MONITORING, ED: Glucose-Capillary: 131 mg/dL — ABNORMAL HIGH (ref 70–99)

## 2018-07-18 MED ORDER — ONDANSETRON HCL 4 MG/2ML IJ SOLN: 4.0000 mg | Freq: Four times a day (QID) | INTRAMUSCULAR | Status: DC | PRN

## 2018-07-18 MED ORDER — GI COCKTAIL ~~LOC~~
30.0000 mL | Freq: Once | ORAL | Status: AC
  Administered 2018-07-18: 30 mL via ORAL
  Filled 2018-07-18: qty 30

## 2018-07-18 MED ORDER — ALUM & MAG HYDROXIDE-SIMETH 200-200-20 MG/5ML PO SUSP
30.0000 mL | Freq: Four times a day (QID) | ORAL | Status: DC | PRN
  Administered 2018-07-18 – 2018-07-20 (×2): 30 mL via ORAL
  Filled 2018-07-18 (×2): qty 30

## 2018-07-18 MED ORDER — PNEUMOCOCCAL VAC POLYVALENT 25 MCG/0.5ML IJ INJ
0.5000 mL | INJECTION | INTRAMUSCULAR | Status: AC
  Administered 2018-07-19: 0.5 mL via INTRAMUSCULAR
  Filled 2018-07-18: qty 0.5

## 2018-07-18 MED ORDER — ISOSORBIDE MONONITRATE ER 30 MG PO TB24
30.0000 mg | ORAL_TABLET | Freq: Every day | ORAL | Status: DC
  Administered 2018-07-19 – 2018-07-21 (×3): 30 mg via ORAL
  Filled 2018-07-18 (×4): qty 1

## 2018-07-18 MED ORDER — LISINOPRIL 40 MG PO TABS
40.0000 mg | ORAL_TABLET | Freq: Every day | ORAL | Status: DC
  Administered 2018-07-19 – 2018-07-21 (×3): 40 mg via ORAL
  Filled 2018-07-18 (×3): qty 1

## 2018-07-18 MED ORDER — INSULIN ASPART 100 UNIT/ML ~~LOC~~ SOLN
0.0000 [IU] | Freq: Every day | SUBCUTANEOUS | Status: DC
  Administered 2018-07-20: 3 [IU] via SUBCUTANEOUS

## 2018-07-18 MED ORDER — ACETAMINOPHEN 325 MG PO TABS: 650.0000 mg | ORAL_TABLET | ORAL | Status: DC | PRN

## 2018-07-18 MED ORDER — MORPHINE SULFATE (PF) 4 MG/ML IV SOLN
2.0000 mg | INTRAVENOUS | Status: DC | PRN
  Administered 2018-07-18: 2 mg via INTRAVENOUS
  Filled 2018-07-18: qty 1

## 2018-07-18 MED ORDER — METOPROLOL TARTRATE 100 MG PO TABS
100.0000 mg | ORAL_TABLET | Freq: Two times a day (BID) | ORAL | Status: DC
  Administered 2018-07-18 – 2018-07-21 (×6): 100 mg via ORAL
  Filled 2018-07-18 (×6): qty 1

## 2018-07-18 MED ORDER — FAMOTIDINE 20 MG PO TABS
10.0000 mg | ORAL_TABLET | ORAL | Status: DC | PRN
  Administered 2018-07-19 – 2018-07-21 (×2): 10 mg via ORAL
  Filled 2018-07-18 (×2): qty 1

## 2018-07-18 MED ORDER — ATORVASTATIN CALCIUM 80 MG PO TABS
80.0000 mg | ORAL_TABLET | Freq: Every day | ORAL | Status: DC
  Administered 2018-07-19 – 2018-07-20 (×2): 80 mg via ORAL
  Filled 2018-07-18 (×2): qty 1

## 2018-07-18 MED ORDER — INSULIN ASPART 100 UNIT/ML ~~LOC~~ SOLN
0.0000 [IU] | Freq: Three times a day (TID) | SUBCUTANEOUS | Status: DC
  Administered 2018-07-19: 5 [IU] via SUBCUTANEOUS
  Administered 2018-07-19 – 2018-07-20 (×3): 3 [IU] via SUBCUTANEOUS
  Administered 2018-07-20: 7 [IU] via SUBCUTANEOUS
  Administered 2018-07-21: 5 [IU] via SUBCUTANEOUS
  Administered 2018-07-21: 7 [IU] via SUBCUTANEOUS
  Administered 2018-07-21: 3 [IU] via SUBCUTANEOUS

## 2018-07-18 MED ORDER — FUROSEMIDE 40 MG PO TABS
40.0000 mg | ORAL_TABLET | Freq: Every day | ORAL | Status: DC
  Administered 2018-07-18 – 2018-07-21 (×4): 40 mg via ORAL
  Filled 2018-07-18 (×4): qty 1

## 2018-07-18 MED ORDER — ASPIRIN 81 MG PO CHEW
81.0000 mg | CHEWABLE_TABLET | Freq: Every day | ORAL | Status: DC
  Administered 2018-07-19 – 2018-07-20 (×2): 81 mg via ORAL
  Filled 2018-07-18 (×3): qty 1

## 2018-07-18 MED ORDER — ENOXAPARIN SODIUM 40 MG/0.4ML ~~LOC~~ SOLN
40.0000 mg | SUBCUTANEOUS | Status: DC
  Administered 2018-07-18 – 2018-07-20 (×3): 40 mg via SUBCUTANEOUS
  Filled 2018-07-18 (×3): qty 0.4

## 2018-07-18 NOTE — Progress Notes (Signed)
Pt takes Zantac PRN for indigestion at home, she may need to be on Protonix while in the hospital, thanks Buckner [place].

## 2018-07-18 NOTE — ED Provider Notes (Signed)
Emergency Department Provider Note   I have reviewed the triage vital signs and the nursing notes.   HISTORY  Chief Complaint Chest Pain   HPI [name] is a 62 y.o. female CAD, DM, GERD, and HLD to the emergency department for evaluation of central chest pain which she describes as a pressure with associated tingling in her bilateral upper extremities.  Patient has prior history of CAD with stent placement (2006 and 2014).  Patient states she was at work today when symptoms worsen.  Her pain became more constant and she began having tingling in the arms.  She states over the past 2 days she has had more intermittent pain with associated nausea and some occasional diaphoresis.  She has not been very active but does not notice any specific exertional or pleuritic pain.    Past Medical History:  Diagnosis Date  . Anginal pain ([name])   . Coronary atherosclerosis of native coronary artery, with stent to LCX in 2006 06/04/2013  . Diabetes mellitus without complication ([name])   . GERD (gastroesophageal reflux disease)   . Heart attack ([name])   . Hyperlipidemia LDL goal < 70 06/07/2013  . Hypertension   . S/P coronary artery stent placement to RCA with PROMUS DES, 06/07/13, residual disease on OM1, OM2 and rPDA 06/07/2013    Patient Active Problem List   Diagnosis Date Noted  . Chest pain 07/18/2018  . Osteoarthritis of both hands 05/01/2016  . Chronic diastolic CHF (congestive heart failure) ([name]) 12/06/2015  . Non-compliance with treatment-(cost) 08/24/2015  . Hypertensive cardiovascular disease 08/24/2015  . Dyslipidemia 08/24/2015  . CAD -S/P RCA DES 08/22/15   . NSTEMI (non-ST elevated myocardial infarction) ([name]) 08/18/2015  . Obesity- BMI 36 06/15/2013  . Type 2 diabetes mellitus, uncontrolled ([name]) 06/04/2013  . Essential hypertension 06/04/2013    Past Surgical History:  Procedure Laterality Date  . CARDIAC CATHETERIZATION N/A 08/22/2015   Procedure: Left Heart Cath and  Coronary Angiography;  Surgeon: Leonie Man, MD;  Location: [facility];  Service: Cardiovascular;  Laterality: N/A;  . CARDIAC CATHETERIZATION N/A 08/22/2015   Procedure: Coronary Stent Intervention;  Surgeon: Leonie Man, MD;  Location: [facility];  Service: Cardiovascular;  Laterality: N/A;  . CARDIAC CATHETERIZATION N/A 08/22/2015   Procedure: Left Heart Cath and Coronary Angiography;  Surgeon: Leonie Man, MD;  Location: [facility];  Service: Cardiovascular;  Laterality: N/A;  . CORONARY STENT PLACEMENT  2006   TAX[facility] stent [facility]X in [facility]  . LEFT HEART CATHETERIZATION WITH CORONARY ANGIOGRAM N/A 06/07/2013   Procedure: LEFT HEART CATHETERIZATION WITH CORONARY ANGIOGRAM;  Surgeon: Sanda Klein, MD;  Location: [facility];  Service: Cardiovascular;  Laterality: N/A;    Allergies Patient has no known allergies.  Family History  Problem Relation Age of Onset  . Diabetes Mother   . Hypertension Mother   . Diabetes Sister   . Diabetes Brother   . Hypertension Brother     Social History Social History   Tobacco Use  . Smoking status: Former Smoker    Years: 25.00    Last attempt to quit: 07/03/2004    Years since quitting: 14.0  . Smokeless tobacco: Never Used  Substance Use Topics  . Alcohol use: Yes    Comment: occ  . Drug use: No    Review of Systems  Constitutional: No fever/chills Eyes: No visual changes. ENT: No sore throat. Cardiovascular: Positive chest pain and bilateral arm  tingling.  Respiratory: Denies shortness of breath. Gastrointestinal: No abdominal pain.  No nausea, no vomiting.  No diarrhea.  No constipation. Genitourinary: Negative for dysuria. Musculoskeletal: Negative for back pain. Skin: Negative for rash. Neurological: Negative for headaches, focal weakness or numbness.  10-point ROS otherwise negative.  ____________________________________________   PHYSICAL EXAM:  VITAL SIGNS: ED Triage Vitals  Enc  Vitals Group     BP 07/18/18 1718 138/79     Pulse Rate 07/18/18 1716 90     Resp 07/18/18 1716 13     Temp 07/18/18 1718 97.8 F (36.6 C)     Temp Source 07/18/18 1718 Oral     SpO2 07/18/18 1716 100 %     Weight 07/18/18 1719 206 lb (93.4 kg)     Height 07/18/18 1719 5\' 2"  (1.575 m)     Pain Score 07/18/18 1705 3    Constitutional: Alert and oriented. Well appearing and in no acute distress. Eyes: Conjunctivae are normal.  Head: Atraumatic. Nose: No congestion/rhinnorhea. Mouth/Throat: Mucous membranes are moist.  Neck: No stridor.   Cardiovascular: Normal rate, regular rhythm. Good peripheral circulation. Grossly normal heart sounds.   Respiratory: Normal respiratory effort.  No retractions. Lungs CTAB. Gastrointestinal: Soft and nontender. No distention.  Musculoskeletal: No lower extremity tenderness nor edema. No gross deformities of extremities. Neurologic:  Normal speech and language. No gross focal neurologic deficits are appreciated.  Skin:  Skin is warm, dry and intact. No rash noted.  ____________________________________________   LABS (all labs ordered are listed, but only abnormal results are displayed)  Labs Reviewed  BASIC METABOLIC PANEL - Abnormal; Notable for the following components:      Result Value   Potassium 5.3 (*)    Glucose, Bld 216 (*)    BUN 26 (*)    All other components within normal limits  TROPONIN I - Abnormal; Notable for the following components:   Troponin I 0.05 (*)    All other components within normal limits  TROPONIN I - Abnormal; Notable for the following components:   Troponin I 0.05 (*)    All other components within normal limits  HEMOGLOBIN A1C - Abnormal; Notable for the following components:   Hgb A1c MFr Bld 8.1 (*)    All other components within normal limits  GLUCOSE, CAPILLARY - Abnormal; Notable for the following components:   Glucose-Capillary 200 (*)    All other components within normal limits  GLUCOSE,  CAPILLARY - Abnormal; Notable for the following components:   Glucose-Capillary 202 (*)    All other components within normal limits  CBG MONITORING, ED - Abnormal; Notable for the following components:   Glucose-Capillary 131 (*)    All other components within normal limits  CBC  D-DIMER, QUANTITATIVE (NOT AT [facility])  TROPONIN I  HIV ANTIBODY (ROUTINE TESTING)  I-STAT TROPONIN, ED  I-STAT BETA HCG BLOOD, ED ([facility], [facility], [facility] ONLY)   ____________________________________________  EKG   EKG Interpretation  Date/Time:  Saturday July 18 2018 17:13:04 EDT Ventricular Rate:  96 PR Interval:    QRS Duration: 89 QT Interval:  359 QTC Calculation: 454 R Axis:   79 Text Interpretation:  Sinus rhythm Consider left atrial enlargement Nonspecific ST changes  No STEMI.  Confirmed by Nanda Quinton [phone]) on 07/18/2018 5:18:42 PM       ____________________________________________  RADIOLOGY  Dg Chest 2 View  Result Date: 07/18/2018 CLINICAL DATA:  Hervey Ard mid chest pain radiating to both shoulders for 2 days, shortness of breath, nausea,  dizziness, lightheadedness, history coronary artery disease post MI and coronary stenting, diabetes mellitus, hypertension EXAM: CHEST - 2 VIEW COMPARISON:  08/26/2015 FINDINGS: Normal heart size, mediastinal contours, and pulmonary vascularity. Atherosclerotic calcification aorta. Chronic bronchitic changes without infiltrate, pleural effusion or pneumothorax. Bones demineralized. IMPRESSION: Mild chronic bronchitic changes without infiltrate. Electronically Signed   By: Lavonia Dana M.D.   On: 07/18/2018 17:55    ____________________________________________   PROCEDURES  Procedure(s) performed:   Procedures  None ____________________________________________   INITIAL IMPRESSION / ASSESSMENT AND PLAN / ED COURSE  Pertinent labs & imaging results that were available during my care of the patient were reviewed by me and considered in my medical decision  making (see chart for details).  She presents to the emergency department for evaluation of central chest pain with bilateral arm tingling.  She said intermittent symptoms over the past 2 days which become more constant.  Ultimately EMS were called and administered full dose aspirin and gave 2 sublingual nitroglycerin which alleviated the patient's pain entirely.  She is currently chest pain-free.  EKG reviewed with no acute ST elevation.  Initial lab work reviewed.  Troponin negative x1.  Given patient's history and similar pain with past CAD plan for admission.   Discussed patient's case with Hospitalist, Dr. Roel Cluck to request admission. Patient and family (if present) updated with plan. Care transferred to Hospitalist service.  I reviewed all nursing notes, vitals, pertinent old records, EKGs, labs, imaging (as available).  ____________________________________________  FINAL CLINICAL IMPRESSION(S) / ED DIAGNOSES  Final diagnoses:  Precordial chest pain    MEDICATIONS GIVEN DURING THIS VISIT:  Medications  insulin aspart (novoLOG) injection 0-5 Units (0 Units Subcutaneous Not Given 07/18/18 2309)  insulin aspart (novoLOG) injection 0-9 Units (3 Units Subcutaneous Given 07/19/18 0840)  aspirin chewable tablet 81 mg (81 mg Oral Given 07/19/18 0840)  atorvastatin (LIPITOR) tablet 80 mg (has no administration in time range)  furosemide (LASIX) tablet 40 mg (40 mg Oral Given 07/19/18 0840)  isosorbide mononitrate (IMDUR) 24 hr tablet 30 mg (30 mg Oral Given 07/19/18 0840)  lisinopril (PRINIVIL,ZESTRIL) tablet 40 mg (40 mg Oral Given 07/19/18 0840)  metoprolol tartrate (LOPRESSOR) tablet 100 mg (100 mg Oral Given 07/19/18 0840)  famotidine (PEPCID) tablet 10 mg (10 mg Oral Given 07/19/18 0207)  enoxaparin (LOVENOX) injection 40 mg (40 mg Subcutaneous Given by Other 07/18/18 2300)  morphine 4 MG/ML injection 2 mg (2 mg Intravenous Given 07/18/18 2029)  acetaminophen (TYLENOL) tablet 650 mg (has no  administration in time range)  ondansetron (ZOFRAN) injection 4 mg (has no administration in time range)  pneumococcal 23 valent vaccine (PNU-IMMUNE) injection 0.5 mL (has no administration in time range)  alum & mag hydroxide-simeth (MAALOX/MYLANTA) 200-200-20 MG/5ML suspension 30 mL (30 mLs Oral Given 07/18/18 2248)  gi cocktail (Maalox,Lidocaine,Donnatal) (30 mLs Oral Given 07/18/18 1845)    Note:  This document was prepared using Dragon voice recognition software and may include unintentional dictation errors.  Nanda Quinton, MD Emergency Medicine    Long, [facility], MD 07/19/18 [phone]

## 2018-07-18 NOTE — ED Notes (Signed)
Provider at bedside

## 2018-07-18 NOTE — ED Triage Notes (Signed)
Pt BIB [facility], sharp chest pain that radiates to BUE x 2 days. Hx MI with stents. C/o shortness of breath, and nausea. Given 324mg  ASA and 2 NTG PTA. Pain from 10/10 to 3/10.

## 2018-07-18 NOTE — H&P (Addendum)
ANJALEE COPE [name]:[id number] DOB: 1956/04/16 DOA: 07/18/2018     PCP: [name], MD   Outpatient Specialists:  CARDS:   Dr. Ellyn Hack Croitoru   Patient arrived to ER on 07/18/18 at 1656  Patient coming from  home Lives   With family    Chief Complaint:  Chief Complaint  Patient presents with  . Chest Pain    HPI: [name] is a 62 y.o. female with medical history significant of CAD , DM2, HLD, diastolic CHF, GERD,   Presented with 2 days of Sharp chest pain radiating to arms bilateral  Associated with shortness of breath and nausea.  Pain described as pressure-like Described as pressure-like started while patient was at work she works as a Scientist, water quality.     The pain  became constant today associated tingling to both arms. Similar to prior angina 2 years ago. NO difference in what she is eating. She flew to [place] last weak. Reports her right leg has been swelling in the past 1 week.  For past 2 days she has been having intermittent pain for the past day she's been having intermittent pain and occasional episodes of nausea and diaphoresis and occasional diaphoresis does not seem to be  made worse by exertion or deep breaths  Pt got ASA 324 mg and 2 of NTG with pain improving from 10 down to 3/10.  Hx od CAD sp DES September 2016 Regarding pertinent Chronic problems:    LAst echo in 2016 showed grade 1 diastolic CHF and preserved EF Hx of DM 2 last A1c is 8.0 on metformin not on insulin  While in ER:  Given GI cocktail with good response   The following Work up has been ordered so far:  Orders Placed This Encounter  Procedures  . DG Chest 2 View  . Basic metabolic panel  . CBC  . Cardiac monitoring  . Saline Lock IV, Maintain IV access  . Consult to hospitalist  . Pulse oximetry, continuous  . I-stat troponin, ED  . I-Stat beta hCG blood, ED  . ED EKG within 10 minutes  . EKG 12-Lead   Trop 0.05   Following Medications were ordered in ER: Medications  gi  cocktail (Maalox,Lidocaine,Donnatal) (30 mLs Oral Given 07/18/18 1845)    Significant initial  Findings: Abnormal Labs Reviewed  BASIC METABOLIC PANEL - Abnormal; Notable for the following components:      Result Value   Potassium 5.3 (*)    Glucose, Bld 216 (*)    BUN 26 (*)    All other components within normal limits     Na  137 K 5.3  Cr   stable,   Lab Results  Component Value Date   CREATININE 0.95 07/18/2018   CREATININE 0.95 02/17/2018   CREATININE 0.78 08/19/2017      WBC 6.6  HG/HCT  stable,      Component Value Date/Time   HGB 14.9 07/18/2018 1713   HCT 45.8 07/18/2018 1713       Troponin (Point of Care Test) Recent Labs    07/18/18 1740  TROPIPOC 0.05       UA not ordered   CXR -  NON acute   ECG:  Personally reviewed by me showing: HR : 96 Rhythm: NSR   no evidence of ischemic changes QTC 454      ED Triage Vitals  Enc Vitals Group     BP 07/18/18 1718 138/79     Pulse Rate  07/18/18 1716 90     Resp 07/18/18 1716 13     Temp 07/18/18 1718 97.8 F (36.6 C)     Temp Source 07/18/18 1718 Oral     SpO2 07/18/18 1716 100 %     Weight 07/18/18 1719 206 lb (93.4 kg)     Height 07/18/18 1719 5\' 2"  (1.575 m)     Head Circumference --      Peak Flow --      Pain Score 07/18/18 1705 3     Pain Loc --      Pain Edu? --      Excl. in [place]? --   TMAX(24)@       Latest  Blood pressure 133/68, pulse 80, temperature 97.8 F (36.6 C), temperature source Oral, resp. rate 16, height 5\' 2"  (1.575 m), weight 93.4 kg (206 lb), SpO2 96 %.     Hospitalist was called for admission for chest pain evaluation in the setting of history of CAD   Review of Systems:    Pertinent positives include:  chest pain,shortness of breath at rest.  Nausea, unilateral leg swelling Constitutional:  No weight loss, night sweats, Fevers, chills, fatigue, weight loss  HEENT:  No headaches, Difficulty swallowing,Tooth/dental problems,Sore throat,  No sneezing,  itching, ear ache, nasal congestion, post nasal drip,  Cardio-vascular:  No Orthopnea, PND, anasarca, dizziness, palpitations.no Bilateral lower extremity swelling  GI:  No heartburn, indigestion, abdominal pain, , vomiting, diarrhea, change in bowel habits, loss of appetite, melena, blood in stool, hematemesis Resp:  no No dyspnea on exertion, No excess mucus, no productive cough, No non-productive cough, No coughing up of blood.No change in color of mucus.No wheezing. Skin:  no rash or lesions. No jaundice GU:  no dysuria, change in color of urine, no urgency or frequency. No straining to urinate.  No flank pain.  Musculoskeletal:  No joint pain or no joint swelling. No decreased range of motion. No back pain.  Psych:  No change in mood or affect. No depression or anxiety. No memory loss.  Neuro: no localizing neurological complaints, no tingling, no weakness, no double vision, no gait abnormality, no slurred speech, no confusion  All systems reviewed and apart from [place] all are negative  Past Medical History:   Past Medical History:  Diagnosis Date  . Anginal pain ([place])   . Coronary atherosclerosis of native coronary artery, with stent to LCX in 2006 06/04/2013  . Diabetes mellitus without complication ([place])   . GERD (gastroesophageal reflux disease)   . Heart attack ([place])   . Hyperlipidemia LDL goal < 70 06/07/2013  . Hypertension   . S/P coronary artery stent placement to RCA with PROMUS DES, 06/07/13, residual disease on OM1, OM2 and rPDA 06/07/2013     Past Surgical History:  Procedure Laterality Date  . CARDIAC CATHETERIZATION N/A 08/22/2015   Procedure: Left Heart Cath and Coronary Angiography;  Surgeon: Leonie Man, MD;  Location: [place] [facility];  Service: Cardiovascular;  Laterality: N/A;  . CARDIAC CATHETERIZATION N/A 08/22/2015   Procedure: Coronary Stent Intervention;  Surgeon: Leonie Man, MD;  Location: [place] [facility];  Service: Cardiovascular;   Laterality: N/A;  . CARDIAC CATHETERIZATION N/A 08/22/2015   Procedure: Left Heart Cath and Coronary Angiography;  Surgeon: Leonie Man, MD;  Location: [place] [facility];  Service: Cardiovascular;  Laterality: N/A;  . CORONARY STENT PLACEMENT  2006   TAXUS stent [facility]X in [facility]  . LEFT HEART CATHETERIZATION  WITH CORONARY ANGIOGRAM N/A 06/07/2013   Procedure: LEFT HEART CATHETERIZATION WITH CORONARY ANGIOGRAM;  Surgeon: Sanda Klein, MD;  Location: [place] [facility];  Service: Cardiovascular;  Laterality: N/A;    Social History:  Ambulatory  independently     reports that she quit smoking about 14 years ago. She quit after 25.00 years of use. She has never used smokeless tobacco. She reports that she drinks alcohol. She reports that she does not use drugs.     Family History:   Family History  Problem Relation Age of Onset  . Diabetes Mother   . Hypertension Mother   . Diabetes Sister   . Diabetes Brother   . Hypertension Brother     Allergies: No Known Allergies   Prior to Admission medications   Medication Sig Start Date End Date Taking? Authorizing Provider  acetaminophen (TYLENOL) 500 MG tablet Take 1,000 mg by mouth daily as needed for moderate pain.     [provider]  aspirin EC 81 MG EC tablet Take 1 tablet (81 mg total) by mouth daily. 08/24/15   Erlene Quan, PA-C  atorvastatin (LIPITOR) 80 MG tablet TAKE 1 TABLET BY MOUTH DAILY AT 6 PM 06/15/18   [name], MD  canagliflozin (INVOKANA) 100 MG TABS tablet Take 1 tablet (100 mg total) by mouth daily. 06/15/18   [name], MD  carbamide peroxide (DEBROX) 6.5 % OTIC solution Place 5 drops into both ears 2 (two) times daily. 11/18/17   [name], MD  cetirizine (ZYRTEC) 10 MG tablet Take 1 tablet (10 mg total) by mouth daily. 11/18/17   [name], MD  furosemide (LASIX) 20 MG tablet Take 2 tablets (40 mg total) by mouth daily. 06/15/18   [name], MD  glimepiride (AMARYL) 4 MG tablet TAKE 1  TABLET BY MOUTH 2 TIMES DAILY WITH A MEAL. 06/15/18   [name], MD  isosorbide mononitrate (IMDUR) 30 MG 24 hr tablet Take 1 tablet (30 mg total) by mouth daily. 06/15/18   [name], MD  lisinopril (PRINIVIL,ZESTRIL) 40 MG tablet Take 1 tablet (40 mg total) by mouth daily. 06/15/18   [name], MD  metFORMIN (GLUCOPHAGE) 500 MG tablet Take 2 tablets (1,000 mg total) by mouth 2 (two) times daily with a meal. 06/15/18   [name], MD  metoprolol tartrate (LOPRESSOR) 100 MG tablet Take 1 tablet (100 mg total) by mouth 2 (two) times daily. 11/18/17   [name], MD  nitroGLYCERIN (NITROSTAT) 0.4 MG SL tablet Place 1 tablet (0.4 mg total) under the tongue every 5 (five) minutes as needed for chest pain. 08/24/15   Erlene Quan, PA-C  oseltamivir (TAMIFLU) 75 MG capsule Take 1 capsule (75 mg total) by mouth 2 (two) times daily. 02/17/18   [name], MD  ranitidine (ZANTAC) 150 MG capsule Take 150 mg by mouth daily as needed for heartburn.    [provider]   Physical Exam: Blood pressure 133/68, pulse 80, temperature 97.8 F (36.6 C), temperature source Oral, resp. rate 16, height 5\' 2"  (1.575 m), weight 93.4 kg (206 lb), SpO2 96 %. 1. General:  in No Acute distress  well -appearing 2. Psychological: Alert and   Oriented 3. Head/ENT:     Dry Mucous Membranes                          Head Non traumatic, neck supple  Normal  Dentition 4. SKIN:  decreased Skin turgor,  Skin clean Dry and intact no rash 5. Heart: Regular rate and rhythm no Murmur, no Rub or gallop 6. Lungs: Clear to auscultation bilaterally, no wheezes or crackles   7. Abdomen: Soft, non-tender, Non distended  obese  bowel sounds present 8. Lower extremities: no clubbing, cyanosis, or  edema 9. Neurologically Grossly intact, moving all 4 extremities equally  10. MSK: Normal range of motion   LABS:     Recent Labs  Lab 07/18/18 1713  WBC 6.6  HGB 14.9  HCT 45.8  MCV  93.5  PLT 983   Basic Metabolic Panel: Recent Labs  Lab 07/18/18 1713  NA 137  K 5.3*  CL 102  CO2 24  GLUCOSE 216*  BUN 26*  CREATININE 0.95  CALCIUM 9.3      No results for input(s): AST, ALT, ALKPHOS, BILITOT, PROT, ALBUMIN in the last 168 hours. No results for input(s): LIPASE, AMYLASE in the last 168 hours. No results for input(s): AMMONIA in the last 168 hours.    HbA1C: No results for input(s): HGBA1C in the last 72 hours. CBG: No results for input(s): GLUCAP in the last 168 hours.    Urine analysis:   Cultures:    Component Value Date/Time   SDES PERITONEAL ABSCESS 04/20/2013 1626   SPECREQUEST Normal 04/20/2013 1626   CULT  04/20/2013 1626    MODERATE GROUP B STREP(S.AGALACTIAE)ISOLATED Note: TESTING AGAINST S. AGALACTIAE NOT ROUTINELY PERFORMED DUE TO PREDICTABILITY OF AMP/PEN/VAN SUSCEPTIBILITY.   REPTSTATUS 04/22/2013 FINAL 04/20/2013 1626     Radiological Exams on Admission: Dg Chest 2 View  Result Date: 07/18/2018 CLINICAL DATA:  Hervey Ard mid chest pain radiating to both shoulders for 2 days, shortness of breath, nausea, dizziness, lightheadedness, history coronary artery disease post MI and coronary stenting, diabetes mellitus, hypertension EXAM: CHEST - 2 VIEW COMPARISON:  08/26/2015 FINDINGS: Normal heart size, mediastinal contours, and pulmonary vascularity. Atherosclerotic calcification aorta. Chronic bronchitic changes without infiltrate, pleural effusion or pneumothorax. Bones demineralized. IMPRESSION: Mild chronic bronchitic changes without infiltrate. Electronically Signed   By: Lavonia Dana M.D.   On: 07/18/2018 17:55    Chart has been reviewed    Assessment/Plan 62 y.o. female with medical history significant of CAD , DM2, HLD, diastolic CHF, GERD,    Admitted for chest pain evaluation in the setting of history of CAD     Present on Admission: . Chest pain - - H=  1 , E=0  ,A= 1 , R   2  , T 0 ,  for the  Total of 4 therefore will admit  for observation and further evaluation ( Risk of MACE: Scores 0-3  of 0.9-1.7%.,  4-6: 12-16.6% , Scores ?7: 50-65% )   - Other explanation for chest pain could be GI related  Given imrovement with GI cocktail    given recent trip and possible leg swelling will check a D.dimer   - monitor on telemetry, cycle cardiac enzymes, obtain serial ECG and  ECHO in AM.   - Daily aspirin -  Further risk stratify with lipid panel, hgA1C, obtain TSH.  Make sure patient is on Aspirin.  We will notify cardiology regarding patient's admission. Further management depends on pending  workup   . Chronic diastolic CHF (congestive heart failure) (HCC) - Currently stable continue home dose Lasix  . Dyslipidemia - continue Lipitor 80 mg  . Essential hypertension - continue home medications, currently stable  . CAD -  -  chronic, continue aspirin and statin and beta blocker  . Type 2 diabetes mellitus, uncontrolled (HCC) -  - Order Sensitive  SSI   -  check TSH and HgA1C  - Hold by mouth medications  Right leg edema - will obtain Doppler to val for DVT given recent travel Other plan as per orders.  DVT prophylaxis:   Lovenox     Code Status:  FULL CODE as per patient   I had personally discussed CODE STATUS with patient and family  Family Communication:   Family   at  Bedside  plan of care was discussed with   Daughter,   Disposition Plan:     To home once workup is complete and patient is stable                      Consults called: please notify cardiology in AM   Admission status:   obs   Level of care   tele          Toy Baker 07/18/2018, 7:57 PM  [facility]  Pager [phone]   after 2 AM please page floor coverage PA If 7AM-7PM, please contact the day team taking care of the patient  [email or web address]  Password [email or web address]

## 2018-07-18 NOTE — ED Notes (Signed)
Patient transported to X-ray 

## 2018-07-18 NOTE — ED Notes (Signed)
Pt started having centralized chest pain while waiting to be transported upstairs. Reports chest pain is intermittent. Morphine given per orders. Pt eating [place] sandwich at present

## 2018-07-18 NOTE — Progress Notes (Signed)
Troponin 0.05, no s/s, MD notified will continue to monitor, Thanks Arvella Nigh RN.

## 2018-07-19 ENCOUNTER — Other Ambulatory Visit: Payer: Self-pay

## 2018-07-19 ENCOUNTER — Observation Stay (HOSPITAL_BASED_OUTPATIENT_CLINIC_OR_DEPARTMENT_OTHER): Payer: Self-pay

## 2018-07-19 DIAGNOSIS — I2510 Atherosclerotic heart disease of native coronary artery without angina pectoris: Secondary | ICD-10-CM

## 2018-07-19 DIAGNOSIS — Z9861 Coronary angioplasty status: Secondary | ICD-10-CM

## 2018-07-19 DIAGNOSIS — R609 Edema, unspecified: Secondary | ICD-10-CM

## 2018-07-19 DIAGNOSIS — R079 Chest pain, unspecified: Secondary | ICD-10-CM

## 2018-07-19 LAB — TROPONIN I
Troponin I: 0.05 ng/mL (ref <0.03)
Troponin I: 0.04 ng/mL (ref <0.03)

## 2018-07-19 LAB — GLUCOSE, CAPILLARY
Glucose-Capillary: 202 mg/dL — ABNORMAL HIGH (ref 70–99)
Glucose-Capillary: 207 mg/dL — ABNORMAL HIGH (ref 70–99)
Glucose-Capillary: 255 mg/dL — ABNORMAL HIGH (ref 70–99)
Glucose-Capillary: 200 mg/dL — ABNORMAL HIGH (ref 70–99)

## 2018-07-19 LAB — HEMOGLOBIN A1C
Hgb A1c MFr Bld: 8.1 % — ABNORMAL HIGH (ref 4.8–5.6)
Mean Plasma Glucose: 185.77 mg/dL

## 2018-07-19 MED ORDER — SODIUM POLYSTYRENE SULFONATE 15 GM/60ML PO SUSP
15.0000 g | Freq: Once | ORAL | Status: DC
  Filled 2018-07-19: qty 60

## 2018-07-19 NOTE — Progress Notes (Signed)
PROGRESS NOTE                                                                                                                                                                                                             Patient Demographics:    [name], is a 62 y.o. female, DOB - October 08, 1956, PVV:[id number]  Admit date - 07/18/2018   Admitting Physician Toy Baker, MD  Outpatient Primary MD for the patient is [name], MD  LOS - 0   Chief Complaint  Patient presents with  . Chest Pain       Brief Narrative   62 y.o. female with medical history significant of CAD , DM2, HLD, diastolic CHF, GERD, with history of CAD, status post 2 stent placed 2016, presents with chest pain.   Subjective:    [name] today has, No headache,No abdominal pain - No Nausea, she denies any chest pain overnight  Assessment  & Plan :    Active Problems:   Type 2 diabetes mellitus, uncontrolled ([place])   Essential hypertension   CAD -S/P RCA DES 08/22/15   Hypertensive cardiovascular disease   Dyslipidemia   Chronic diastolic CHF (congestive heart failure) (HCC)   Chest pain    Chest pain  -With known history of CAD, status post 2 stents in 2016, surgery input greatly appreciated, patient will need nuclear stress test for risk stratification. -D-dimers within normal limit  Chronic diastolic CHF - Currently stable continue home dose Lasix  Dyslipidemia  - continue Lipitor 80 mg  Essential hypertension  - continue home medications, currently stable  CAD  - chronic, continue aspirin and statin and beta blocker  Type 2 diabetes mellitus,  -Hold home regimen, continue with insulin sliding scale during hospital stay   Right leg edema  -Venous Doppler negative for DVT  Hyperkalemia -We will give 1 dose Kayexalate, recheck BMP in a.m.      Code Status : Full  Family Communication  : Daughter at bedside  Disposition Plan  :  Home when stable  Consults  : Cardioloy  Procedures  : None  DVT Prophylaxis  :  lovenox  Lab Results  Component Value Date   PLT 260 07/18/2018    Antibiotics  :    Anti-infectives (From admission, onward)   None  Objective:   Vitals:   07/18/18 2015 07/18/18 2116 07/19/18 0018 07/19/18 0543  BP: (!) 148/67 (!) 149/72 (!) 153/82 (!) 146/89  Pulse: 60 61 67 64  Resp:  16 18 18   Temp:  98.4 F (36.9 C) 98.4 F (36.9 C) (!) 97.5 F (36.4 C)  TempSrc:  Oral Oral Oral  SpO2: 100% 94% 100% 100%  Weight:  94.7 kg (208 lb 12.8 oz)  94.4 kg (208 lb 1.6 oz)  Height:  5\' 2"  (1.575 m)      Wt Readings from Last 3 Encounters:  07/19/18 94.4 kg (208 lb 1.6 oz)  02/17/18 89.3 kg (196 lb 12.8 oz)  11/18/17 89.9 kg (198 lb 3.2 oz)     Intake/Output Summary (Last 24 hours) at 07/19/2018 1251 Last data filed at 07/19/2018 0600 Gross per 24 hour  Intake 460 ml  Output 800 ml  Net -340 ml     Physical Exam  Awake Alert, Oriented X 3, No new F.N deficits, Normal affect Symmetrical Chest wall movement, Good air movement bilaterally, CTAB RRR,No Gallops,Rubs or new Murmurs, No Parasternal Heave +ve B.Sounds, Abd Soft, No tenderness, No rebound - guarding or rigidity. No Cyanosis, Clubbing , +1 right lower ext edema    Data Review:    CBC Recent Labs  Lab 07/18/18 1713  WBC 6.6  HGB 14.9  HCT 45.8  PLT 260  MCV 93.5  MCH 30.4  MCHC 32.5  RDW 13.3    Chemistries  Recent Labs  Lab 07/18/18 1713  NA 137  K 5.3*  CL 102  CO2 24  GLUCOSE 216*  BUN 26*  CREATININE 0.95  CALCIUM 9.3   ------------------------------------------------------------------------------------------------------------------ No results for input(s): CHOL, HDL, LDLCALC, TRIG, CHOLHDL, LDLDIRECT in the last 72 hours.  Lab Results  Component Value Date   HGBA1C 8.1 (H) 07/19/2018    ------------------------------------------------------------------------------------------------------------------ No results for input(s): TSH, T4TOTAL, T3FREE, THYROIDAB in the last 72 hours.  Invalid input(s): FREET3 ------------------------------------------------------------------------------------------------------------------ No results for input(s): VITAMINB12, FOLATE, FERRITIN, TIBC, IRON, RETICCTPCT in the last 72 hours.  Coagulation profile No results for input(s): INR, PROTIME in the last 168 hours.  Recent Labs    07/18/18 2045  DDIMER <0.27    Cardiac Enzymes Recent Labs  Lab 07/18/18 2045 07/19/18 0302 07/19/18 0806  TROPONINI 0.05* 0.05* 0.04*   ------------------------------------------------------------------------------------------------------------------    Component Value Date/Time   BNP 80.6 08/27/2015 0632    Inpatient Medications  Scheduled Meds: . aspirin  81 mg Oral Daily  . atorvastatin  80 mg Oral q1800  . enoxaparin (LOVENOX) injection  40 mg Subcutaneous Q24H  . furosemide  40 mg Oral Daily  . insulin aspart  0-5 Units Subcutaneous QHS  . insulin aspart  0-9 Units Subcutaneous TID WC  . isosorbide mononitrate  30 mg Oral Daily  . lisinopril  40 mg Oral Daily  . metoprolol tartrate  100 mg Oral BID   Continuous Infusions: PRN Meds:.acetaminophen, alum & mag hydroxide-simeth, famotidine, morphine injection, ondansetron (ZOFRAN) IV  Micro Results No results found for this or any previous visit (from the past 240 hour(s)).  Radiology Reports Dg Chest 2 View  Result Date: 07/18/2018 CLINICAL DATA:  Hervey Ard mid chest pain radiating to both shoulders for 2 days, shortness of breath, nausea, dizziness, lightheadedness, history coronary artery disease post MI and coronary stenting, diabetes mellitus, hypertension EXAM: CHEST - 2 VIEW COMPARISON:  08/26/2015 FINDINGS: Normal heart size, mediastinal contours, and pulmonary vascularity.  Atherosclerotic calcification aorta. Chronic bronchitic changes  without infiltrate, pleural effusion or pneumothorax. Bones demineralized. IMPRESSION: Mild chronic bronchitic changes without infiltrate. Electronically Signed   By: Lavonia Dana M.D.   On: 07/18/2018 17:55     Phillips Climes M.D on 07/19/2018 at 12:51 PM  Between 7am to 7pm - Pager - [phone]  After 7pm go to [email or web address] - password [facility]  [facility] -  Office  [phone]

## 2018-07-19 NOTE — Progress Notes (Signed)
RLE venous duplex prelim: negative for DVT.  Luva Metzger Eunice, RDMS, RVT  

## 2018-07-19 NOTE — Consult Note (Signed)
Cardiology Consultation:   Patient ID: [name]; [id number]; 04/29/1956   Admit date: 07/18/2018 Date of Consult: 07/19/2018  Primary Care Provider: [name], MD Primary Cardiologist: Sanda Klein, MD   Patient Profile:   [name] is a 62 y.o. female with a hx of CAD s/p remote MIs with PCI to the RCA and LAD, chronic diastolic HF, Q0HK, HTN, HLD and obesity, who is being seen today for the evaluation of chest pain and mildly elevated tropoinin at the request of Dr. Waldron Labs, Internal Medicine.  History of Present Illness:   [name] is a 62 year old female with history of hypertension, hyperlipidemia, diabetes, obesity, chronic diastolic HF and known CAD. CAD history began in 2006 where she had a stent placed in her circumflex. She then had acute coronary syndrome in June 2014 underwent PCI to the RCA. She did well for about a year and then apparently because of financial issues abruptly stop all of her medications. She presented 08/18/15 with a NSTEMI.  She underwent two-vessel PCI of the LAD and RCA (new sites) with DES stents on 08/22/15. Post cath she had severe 10/10 chest pain. She went for re-look catheterization which revealed widely patent LAD and RCA stents. She was noted to be profoundly hypertensive. Pain did improve with improvement of her blood pressure. Dr Ellyn Hack felt her chest pain was from uncontrolled HTN and her medications were adjusted. She was admitted again 08/26/15-08/27/15 for chest pain. It was felt her symptoms were atypical and she was observed overnight and discharged.  She presented to [facility] ED on 07/18/18 with complaint of chest pain. Symptoms present off and on for the last few days. Substernal chest pain. Sharp in character, but similar to prior angina. She has has some relief however with antacids. Not any worse with exertion. She does not have SL NTG at home. Also notes right leg swelling. She traveled to [place] last week, however D-dimer is  negative, ruling out the possibility of PE/DVT.  Troponins are minimally elevated x 2 with flat trend at 0.05>>0.05>>0.05. At time of her last NSTEMi in 2016, her trop peaked at 0.82. EKG at 0556 today shows NSR with some mild diffuse ST elevations, new from prior EKGs. CBC WNL. BMP notable for mild hyperkalemia at 5.3 yesterday. Glucose 216. Repeat BMP pending. She was given GI cocktail and started on H2 blocker and denies any recurrent CP.    Past Medical History:  Diagnosis Date  . Anginal pain ([place])   . Coronary atherosclerosis of native coronary artery, with stent to LCX in 2006 06/04/2013  . Diabetes mellitus without complication ([place])   . GERD (gastroesophageal reflux disease)   . Heart attack ([place])   . Hyperlipidemia LDL goal < 70 06/07/2013  . Hypertension   . S/P coronary artery stent placement to RCA with PROMUS DES, 06/07/13, residual disease on OM1, OM2 and rPDA 06/07/2013    Past Surgical History:  Procedure Laterality Date  . CARDIAC CATHETERIZATION N/A 08/22/2015   Procedure: Left Heart Cath and Coronary Angiography;  Surgeon: Leonie Man, MD;  Location: [place] CV LAB;  Service: Cardiovascular;  Laterality: N/A;  . CARDIAC CATHETERIZATION N/A 08/22/2015   Procedure: Coronary Stent Intervention;  Surgeon: Leonie Man, MD;  Location: [place] CV LAB;  Service: Cardiovascular;  Laterality: N/A;  . CARDIAC CATHETERIZATION N/A 08/22/2015   Procedure: Left Heart Cath and Coronary Angiography;  Surgeon: Leonie Man, MD;  Location: [place] CV LAB;  Service: Cardiovascular;  Laterality: N/A;  . CORONARY STENT PLACEMENT  2006   TAXUS stent CFX in [facility]  . LEFT HEART CATHETERIZATION WITH CORONARY ANGIOGRAM N/A 06/07/2013   Procedure: LEFT HEART CATHETERIZATION WITH CORONARY ANGIOGRAM;  Surgeon: Sanda Klein, MD;  Location: [place] CATH LAB;  Service: Cardiovascular;  Laterality: N/A;     Home Medications:  Prior to Admission medications   Medication Sig Start Date  End Date Taking? Authorizing Provider  acetaminophen (TYLENOL) 500 MG tablet Take 1,000 mg by mouth daily as needed for moderate pain.    Yes [provider]  aspirin EC 81 MG EC tablet Take 1 tablet (81 mg total) by mouth daily. 08/24/15  Yes Kilroy, Luke K, PA-C  atorvastatin (LIPITOR) 80 MG tablet TAKE 1 TABLET BY MOUTH DAILY AT 6 PM 06/15/18  Yes Newlin, Enobong, MD  canagliflozin (INVOKANA) 100 MG TABS tablet Take 1 tablet (100 mg total) by mouth daily. 06/15/18  Yes [name], MD  furosemide (LASIX) 20 MG tablet Take 2 tablets (40 mg total) by mouth daily. 06/15/18  Yes Newlin, Enobong, MD  glimepiride (AMARYL) 4 MG tablet TAKE 1 TABLET BY MOUTH 2 TIMES DAILY WITH A MEAL. 06/15/18  Yes Newlin, Enobong, MD  ibuprofen (ADVIL,MOTRIN) 200 MG tablet Take 400 mg by mouth every 6 (six) hours as needed for mild pain.   Yes [provider]  isosorbide mononitrate (IMDUR) 30 MG 24 hr tablet Take 1 tablet (30 mg total) by mouth daily. 06/15/18  Yes [name], MD  lisinopril (PRINIVIL,ZESTRIL) 40 MG tablet Take 1 tablet (40 mg total) by mouth daily. 06/15/18  Yes [name], MD  metFORMIN (GLUCOPHAGE) 500 MG tablet Take 2 tablets (1,000 mg total) by mouth 2 (two) times daily with a meal. 06/15/18  Yes Newlin, Enobong, MD  metoprolol tartrate (LOPRESSOR) 100 MG tablet Take 1 tablet (100 mg total) by mouth 2 (two) times daily. 11/18/17  Yes [name], MD  nitroGLYCERIN (NITROSTAT) 0.4 MG SL tablet Place 1 tablet (0.4 mg total) under the tongue every 5 (five) minutes as needed for chest pain. 08/24/15  Yes Kilroy, Luke K, PA-C  ranitidine (ZANTAC) 150 MG capsule Take 150 mg by mouth daily.    Yes [provider]  cetirizine (ZYRTEC) 10 MG tablet Take 1 tablet (10 mg total) by mouth daily. Patient not taking: Reported on 07/18/2018 11/18/17   [name], MD    Inpatient Medications: Scheduled Meds: . aspirin  81 mg Oral Daily  . atorvastatin  80 mg Oral q1800  .  enoxaparin (LOVENOX) injection  40 mg Subcutaneous Q24H  . furosemide  40 mg Oral Daily  . insulin aspart  0-5 Units Subcutaneous QHS  . insulin aspart  0-9 Units Subcutaneous TID WC  . isosorbide mononitrate  30 mg Oral Daily  . lisinopril  40 mg Oral Daily  . metoprolol tartrate  100 mg Oral BID  . pneumococcal 23 valent vaccine  0.5 mL Intramuscular Tomorrow-1000   Continuous Infusions:  PRN Meds: acetaminophen, alum & mag hydroxide-simeth, famotidine, morphine injection, ondansetron (ZOFRAN) IV  Allergies:   No Known Allergies  Social History:   Social History   Socioeconomic History  . Marital status: Divorced    Spouse name: Not on file  . Number of children: 1  . Years of education: Not on file  . Highest education level: Not on file  Occupational History  . Occupation: Investment banker, operational  . Financial resource strain: Not on file  . Food insecurity:  Worry: Not on file    Inability: Not on file  . Transportation needs:    Medical: Not on file    Non-medical: Not on file  Tobacco Use  . Smoking status: Former Smoker    Years: 25.00    Last attempt to quit: 07/03/2004    Years since quitting: 14.0  . Smokeless tobacco: Never Used  Substance and Sexual Activity  . Alcohol use: Yes    Comment: occ  . Drug use: No  . Sexual activity: Not on file  Lifestyle  . Physical activity:    Days per week: Not on file    Minutes per session: Not on file  . Stress: Not on file  Relationships  . Social connections:    Talks on phone: Not on file    Gets together: Not on file    Attends religious service: Not on file    Active member of club or organization: Not on file    Attends meetings of clubs or organizations: Not on file    Relationship status: Not on file  . Intimate partner violence:    Fear of current or ex partner: Not on file    Emotionally abused: Not on file    Physically abused: Not on file    Forced sexual activity: Not on file  Other Topics  Concern  . Not on file  Social History Narrative  . Not on file    Family History:    Family History  Problem Relation Age of Onset  . Diabetes Mother   . Hypertension Mother   . Diabetes Sister   . Diabetes Brother   . Hypertension Brother      ROS:  Please see the history of present illness.   All other ROS reviewed and negative.     Physical Exam/Data:   Vitals:   07/18/18 2015 07/18/18 2116 07/19/18 0018 07/19/18 0543  BP: (!) 148/67 (!) 149/72 (!) 153/82 (!) 146/89  Pulse: 60 61 67 64  Resp:  16 18 18   Temp:  98.4 F (36.9 C) 98.4 F (36.9 C) (!) 97.5 F (36.4 C)  TempSrc:  Oral Oral Oral  SpO2: 100% 94% 100% 100%  Weight:  208 lb 12.8 oz (94.7 kg)  208 lb 1.6 oz (94.4 kg)  Height:  5\' 2"  (1.575 m)      Intake/Output Summary (Last 24 hours) at 07/19/2018 1000 Last data filed at 07/19/2018 0600 Gross per 24 hour  Intake 460 ml  Output 800 ml  Net -340 ml   Filed Weights   07/18/18 1719 07/18/18 2116 07/19/18 0543  Weight: 206 lb (93.4 kg) 208 lb 12.8 oz (94.7 kg) 208 lb 1.6 oz (94.4 kg)   Body mass index is 38.06 kg/m.  General:  Well nourished, well developed, in no acute distress, obese  HEENT: normal Lymph: no adenopathy Neck: no JVD Endocrine:  No thryomegaly Vascular: No carotid bruits; FA pulses 2+ bilaterally without bruits  Cardiac:  normal S1, S2; RRR; no murmur Lungs:  clear to auscultation bilaterally, no wheezing, rhonchi or rales  Abd: soft, nontender, no hepatomegaly  Ext: no edema Musculoskeletal:  No deformities, BUE and BLE strength normal and equal Skin: warm and dry  Neuro:  CNs 2-12 intact, no focal abnormalities noted Psych:  Normal affect   EKG:  The EKG was personally reviewed and demonstrates:  NSR, EKG at 0556 showed slight diffuse ST segment elevation, not seen on prior ? Pericarditis.  Telemetry:  Telemetry was  personally reviewed and demonstrates:  NSR   Relevant CV Studies: Last [facility] 08/22/2015 Left Heart Cath and  Coronary Angiography  Conclusion    Patent stents in RCA - from 2014 & this AM. Patent LAD stent.  1st Mrg lesion, 90% stenosed. Ost 2nd Mrg to 2nd Mrg lesion, 55% stenosed. 3rd Mrg lesion, 80% stenosed.  Mid Cx to Dist Cx lesion, 5% stenosed. The lesion was previously treated with a stent (unknown type) greater than two years ago. 2006 - in [place] 1st Diag to 1st Diag lesion, 40% stenosed. Dist LAD lesion, 90% stenosed.  Normal Systolic Pressure with normal LVEDP.    Widely patent stents in the RCA and LAD from this morning.  Improved systolic hypertension with now normal LVEDP.  Most likely etiology for her chest pain was profound systemic hypertension with existing CAD.     Laboratory Data:  Chemistry Recent Labs  Lab 07/18/18 1713  NA 137  K 5.3*  CL 102  CO2 24  GLUCOSE 216*  BUN 26*  CREATININE 0.95  CALCIUM 9.3  GFRNONAA >60  GFRAA >60  ANIONGAP 11    No results for input(s): PROT, ALBUMIN, AST, ALT, ALKPHOS, BILITOT in the last 168 hours. Hematology Recent Labs  Lab 07/18/18 1713  WBC 6.6  RBC 4.90  HGB 14.9  HCT 45.8  MCV 93.5  MCH 30.4  MCHC 32.5  RDW 13.3  PLT 260   Cardiac Enzymes Recent Labs  Lab 07/18/18 2045 07/19/18 0302  TROPONINI 0.05* 0.05*    Recent Labs  Lab 07/18/18 1740  TROPIPOC 0.05    BNPNo results for input(s): BNP, PROBNP in the last 168 hours.  DDimer  Recent Labs  Lab 07/18/18 2045  DDIMER <0.27    Radiology/Studies:  Dg Chest 2 View  Result Date: 07/18/2018 CLINICAL DATA:  Hervey Ard mid chest pain radiating to both shoulders for 2 days, shortness of breath, nausea, dizziness, lightheadedness, history coronary artery disease post MI and coronary stenting, diabetes mellitus, hypertension EXAM: CHEST - 2 VIEW COMPARISON:  08/26/2015 FINDINGS: Normal heart size, mediastinal contours, and pulmonary vascularity. Atherosclerotic calcification aorta. Chronic bronchitic changes without infiltrate, pleural  effusion or pneumothorax. Bones demineralized. IMPRESSION: Mild chronic bronchitic changes without infiltrate. Electronically Signed   By: Lavonia Dana M.D.   On: 07/18/2018 17:55    Assessment and Plan:   CASSANDRE OLEKSY is a 62 y.o. female with a hx of CAD s/p remote MIs with PCI to the RCA and LAD, chronic diastolic HF, Z6XW, HTN, HLD and obesity, who is being seen today for the evaluation of chest pain and mildly elevated tropoinin at the request of Dr. Waldron Labs, Internal Medicine.  1. Chest Pain/CAD: pt notes some features are similar to prior angina but she also has features that suggest a GI component. She has had some relief with antacids, GI cocktail and now on H2 blocker. No recurrent CP since starting this therapy. Troponins are mildly elevated but with flat, low level trend. Given her CAD history, we can opt for NST to r/o ischemia. If negative, continue to treat for reflux. MD to follow with further recommendations.   For questions or updates, please contact [place] Please consult [email or web address] for contact info under Cardiology/STEMI.   Signed, Lyda Jester, PA-C  07/19/2018 10:00 AM

## 2018-07-19 NOTE — Progress Notes (Signed)
Inpatient Diabetes Program Recommendations  AACE/ADA: New Consensus Statement on Inpatient Glycemic Control (2019)  Target Ranges:  Prepandial:   less than 140 mg/dL      Peak postprandial:   less than 180 mg/dL (1-2 hours)      Critically ill patients:  140 - 180 mg/dL  Results for [name], [name] (MRN [id number]) as of 07/19/2018 10:11  Ref. Range 07/18/2018 19:35 07/18/2018 23:08 07/19/2018 08:27  Glucose-Capillary Latest Ref Range: 70 - 99 mg/dL 131 (H) 200 (H) 202 (H)   Results for [name], [name] (MRN [id number]) as of 07/19/2018 10:11  Ref. Range 07/19/2018 03:02  Hemoglobin A1C Latest Ref Range: 4.8 - 5.6 % 8.1 (H)   Review of Glycemic Control  Diabetes history: DM2 Outpatient Diabetes medications: Invokana 100 mg daily, Amaryl 4 mg BID, Metformin 1000 mg BID Current orders for Inpatient glycemic control: Novolog 0-9 units TID with meals, Novolog 0-5 units QHS  Inpatient Diabetes Program Recommendations:  HgbA1C: A1C 8.1% on 07/19/18 indicating an average glucose of 186 mg/dl over the past 2-3 months.   NOTE: Noted consult for Diabetes Coordinator. Diabetes Coordinator is not on campus over the weekend but available by pager from 8am to 5pm for questions or concerns. Chart reviewed. Fasting glucose 202 mg/dl this morning and just received Novolog correction starting this morning. If CBGs become consistently greater than 180 mg/dl with Novolog correction; may want to consider ordering low dose basal insulin. Will plan to have Inpatient Diabetes Coordinator follow up with patient on Monday (8/5).  Thanks, Barnie Alderman, RN, MSN, CDE Diabetes Coordinator Inpatient Diabetes Program [phone] (Team Pager from 8am to 5pm)

## 2018-07-20 ENCOUNTER — Observation Stay (HOSPITAL_COMMUNITY): Payer: Self-pay

## 2018-07-20 DIAGNOSIS — R079 Chest pain, unspecified: Secondary | ICD-10-CM

## 2018-07-20 LAB — CBC
HEMATOCRIT: 46.9 % — AB (ref 36.0–46.0)
Hemoglobin: 15.1 g/dL — ABNORMAL HIGH (ref 12.0–15.0)
MCH: 29.8 pg (ref 26.0–34.0)
MCHC: 32.2 g/dL (ref 30.0–36.0)
MCV: 92.7 fL (ref 78.0–100.0)
Platelets: 240 10*3/uL (ref 150–400)
RBC: 5.06 MIL/uL (ref 3.87–5.11)
RDW: 13.4 % (ref 11.5–15.5)
WBC: 6.3 10*3/uL (ref 4.0–10.5)

## 2018-07-20 LAB — NM MYOCAR MULTI W/SPECT W/WALL MOTION / EF
CHL CUP RESTING HR STRESS: 59 {beats}/min
CSEPEDS: 0 s
CSEPPHR: 98 {beats}/min
Estimated workload: 1 METS
Exercise duration (min): 0 min

## 2018-07-20 LAB — BASIC METABOLIC PANEL
Anion gap: 10 (ref 5–15)
BUN: 27 mg/dL — AB (ref 6–20)
CHLORIDE: 107 mmol/L (ref 98–111)
CO2: 23 mmol/L (ref 22–32)
CREATININE: 0.86 mg/dL (ref 0.44–1.00)
Calcium: 9 mg/dL (ref 8.9–10.3)
GFR calc Af Amer: >60 mL/min (ref >60)
GFR calc non Af Amer: >60 mL/min (ref >60)
GLUCOSE: 216 mg/dL — AB (ref 70–99)
POTASSIUM: 4.5 mmol/L (ref 3.5–5.1)
SODIUM: 140 mmol/L (ref 135–145)

## 2018-07-20 LAB — HIV ANTIBODY (ROUTINE TESTING W REFLEX): HIV Screen 4th Generation wRfx: NONREACTIVE

## 2018-07-20 LAB — GLUCOSE, CAPILLARY
GLUCOSE-CAPILLARY: 210 mg/dL — AB (ref 70–99)
GLUCOSE-CAPILLARY: 307 mg/dL — AB (ref 70–99)
GLUCOSE-CAPILLARY: 276 mg/dL — AB (ref 70–99)

## 2018-07-20 MED ORDER — TECHNETIUM TC 99M TETROFOSMIN IV KIT
10.0000 | PACK | Freq: Once | INTRAVENOUS | Status: AC | PRN
  Administered 2018-07-20: 10 via INTRAVENOUS

## 2018-07-20 MED ORDER — REGADENOSON 0.4 MG/5ML IV SOLN
INTRAVENOUS | Status: AC
  Filled 2018-07-20: qty 5

## 2018-07-20 MED ORDER — REGADENOSON 0.4 MG/5ML IV SOLN
0.4000 mg | Freq: Once | INTRAVENOUS | Status: AC
  Administered 2018-07-20: 0.4 mg via INTRAVENOUS
  Filled 2018-07-20: qty 5

## 2018-07-20 MED ORDER — TECHNETIUM TC 99M TETROFOSMIN IV KIT
30.0000 | PACK | Freq: Once | INTRAVENOUS | Status: AC | PRN
  Administered 2018-07-20: 30 via INTRAVENOUS

## 2018-07-20 MED ORDER — SODIUM CHLORIDE 0.9 % IV SOLN
INTRAVENOUS | Status: DC
  Administered 2018-07-21: 05:00:00 via INTRAVENOUS

## 2018-07-20 MED ORDER — METOPROLOL TARTRATE 50 MG PO TABS
ORAL_TABLET | ORAL | Status: AC
  Administered 2018-07-20: 15:00:00
  Filled 2018-07-20: qty 2

## 2018-07-20 MED ORDER — ASPIRIN 81 MG PO CHEW
81.0000 mg | CHEWABLE_TABLET | ORAL | Status: AC
  Administered 2018-07-21: 81 mg via ORAL
  Filled 2018-07-20: qty 1

## 2018-07-20 MED ORDER — SODIUM CHLORIDE 0.9% FLUSH: 3.0000 mL | INTRAVENOUS | Status: DC | PRN

## 2018-07-20 MED ORDER — SODIUM CHLORIDE 0.9% FLUSH
3.0000 mL | Freq: Two times a day (BID) | INTRAVENOUS | Status: DC
  Administered 2018-07-20: 3 mL via INTRAVENOUS

## 2018-07-20 MED ORDER — SODIUM CHLORIDE 0.9 % IV SOLN: 250.0000 mL | INTRAVENOUS | Status: DC | PRN

## 2018-07-20 NOTE — Progress Notes (Addendum)
Inpatient Diabetes Program Recommendations  AACE/ADA: New Consensus Statement on Inpatient Glycemic Control (2015)  Target Ranges:  Prepandial:   less than 140 mg/dL      Peak postprandial:   less than 180 mg/dL (1-2 hours)      Critically ill patients:  140 - 180 mg/dL   Lab Results  Component Value Date   GLUCAP 210 (H) 07/20/2018   HGBA1C 8.1 (H) 07/19/2018    Review of Glycemic Control Results for [name], [name] (MRN [id number]) as of 07/20/2018 09:06  Ref. Range 07/19/2018 11:39 07/19/2018 16:44 07/19/2018 21:08 07/20/2018 07:46  Glucose-Capillary Latest Ref Range: 70 - 99 mg/dL 207 (H) 255 (H) 200 (H) 210 (H)   Diabetes history: Type 2 DM Diabetes history: DM2 Outpatient Diabetes medications: Invokana 100 mg daily, Amaryl 4 mg BID, Metformin 1000 mg BID Current orders for Inpatient glycemic control: Novolog 0-9 units TID with meals, Novolog 0-5 units QHS   Inpatient Diabetes Program Recommendations:   Consider adding Lantus 10 units QD, as BS have been consistently >180mg /dL even with correction dosages. Will plan to see patient today.   Thanks, Bronson Curb, MSN, RNC-OB Diabetes Coordinator [phone] (8a-5p)   Addendum@ 1200: Spoke with patient regarding outpatient management of diabetes. Patient has been on insulin in the past, however, was discontinued. She sees PCP within [facility] and gets her medications there. Is not interested in insulin at this time.   Reviewed patient's current A1c of 8.1%. Explained what a A1c is and what it measures. Also reviewed goal A1c with patient, importance of good glucose control @ home, and blood sugar goals. Briefly reviewed patho of DM, need for insulin, vascular changes, and comorbidites associated.   Patient has a meter, but admits to not checking blood sugars. Encouraged to start obtaining a FSBS is she was willing and following back to her PCP. Patient states, " I know what to do, just got to do it." Reviewed  essential elements of carb diet and encouraged to increase activity as she was able. At this time, patient has no further questions related to DM.   Thanks, Bronson Curb, MSN, RNC-OB Diabetes Coordinator [phone] (8a-5p)

## 2018-07-20 NOTE — Progress Notes (Addendum)
Progress Note  Patient Name: [name] Date of Encounter: 07/20/2018  Primary Cardiologist: Sanda Klein, MD   Subjective   Patient seen and examined in nuclear medicine. Denies chest pain/ SOB this morning.   Inpatient Medications    Scheduled Meds: . aspirin  81 mg Oral Daily  . atorvastatin  80 mg Oral q1800  . enoxaparin (LOVENOX) injection  40 mg Subcutaneous Q24H  . furosemide  40 mg Oral Daily  . insulin aspart  0-5 Units Subcutaneous QHS  . insulin aspart  0-9 Units Subcutaneous TID WC  . isosorbide mononitrate  30 mg Oral Daily  . lisinopril  40 mg Oral Daily  . metoprolol tartrate      . metoprolol tartrate  100 mg Oral BID  . regadenoson      . sodium polystyrene  15 g Oral Once   Continuous Infusions:  PRN Meds: acetaminophen, alum & mag hydroxide-simeth, famotidine, morphine injection, ondansetron (ZOFRAN) IV   Vital Signs    Vitals:   07/20/18 1146 07/20/18 1148 07/20/18 1151 07/20/18 1153  BP: (!) 198/89 (!) 214/126  (!) 186/106  Pulse:   86   Resp:      Temp:      TempSrc:      SpO2:      Weight:      Height:        Intake/Output Summary (Last 24 hours) at 07/20/2018 1201 Last data filed at 07/20/2018 1100 Gross per 24 hour  Intake 100 ml  Output 0 ml  Net 100 ml   Filed Weights   07/18/18 2116 07/19/18 0543 07/20/18 0550  Weight: 208 lb 12.8 oz (94.7 kg) 208 lb 1.6 oz (94.4 kg) 209 lb 1.6 oz (94.8 kg)    Telemetry    Unavailable for review in nuc med - Personally Reviewed   Physical Exam   GEN: No acute distress.   Neck: No JVD, no carotid bruits Cardiac:  RRR, no murmurs, rubs, or gallops.  Respiratory: Clear to auscultation bilaterally, no wheezes/ rales/ rhonchi GI: NABS, Soft, obese, nontender, non-distended  MS: No edema; No deformity. Neuro:  Nonfocal, moving all extremities spontaneously Psych: Normal affect   Labs    Chemistry Recent Labs  Lab 07/18/18 1713 07/20/18 0544  NA 137 140  K 5.3* 4.5  CL 102 107   CO2 24 23  GLUCOSE 216* 216*  BUN 26* 27*  CREATININE 0.95 0.86  CALCIUM 9.3 9.0  GFRNONAA >60 >60  GFRAA >60 >60  ANIONGAP 11 10     Hematology Recent Labs  Lab 07/18/18 1713 07/20/18 0544  WBC 6.6 6.3  RBC 4.90 5.06  HGB 14.9 15.1*  HCT 45.8 46.9*  MCV 93.5 92.7  MCH 30.4 29.8  MCHC 32.5 32.2  RDW 13.3 13.4  PLT 260 240    Cardiac Enzymes Recent Labs  Lab 07/18/18 2045 07/19/18 0302 07/19/18 0806  TROPONINI 0.05* 0.05* 0.04*    Recent Labs  Lab 07/18/18 1740  TROPIPOC 0.05     BNPNo results for input(s): BNP, PROBNP in the last 168 hours.   DDimer  Recent Labs  Lab 07/18/18 2045  DDIMER <0.27     Radiology    Dg Chest 2 View  Result Date: 07/18/2018 CLINICAL DATA:  Hervey Ard mid chest pain radiating to both shoulders for 2 days, shortness of breath, nausea, dizziness, lightheadedness, history coronary artery disease post MI and coronary stenting, diabetes mellitus, hypertension EXAM: CHEST - 2 VIEW COMPARISON:  08/26/2015 FINDINGS:  Normal heart size, mediastinal contours, and pulmonary vascularity. Atherosclerotic calcification aorta. Chronic bronchitic changes without infiltrate, pleural effusion or pneumothorax. Bones demineralized. IMPRESSION: Mild chronic bronchitic changes without infiltrate. Electronically Signed   By: Lavonia Dana M.D.   On: 07/18/2018 17:55    Cardiac Studies   Last [facility] 08/22/2015 Left Heart Cath and Coronary Angiography  Conclusion    Patent stents in RCA - from 2014 & this AM. Patent LAD stent.  1st Mrg lesion, 90% stenosed. Ost 2nd Mrg to 2nd Mrg lesion, 55% stenosed. 3rd Mrg lesion, 80% stenosed.  Mid Cx to Dist Cx lesion, 5% stenosed. The lesion was previously treated with a stent (unknown type) greater than two years ago. 2006 - in [place] 1st Diag to 1st Diag lesion, 40% stenosed. Dist LAD lesion, 90% stenosed.  Normal Systolic Pressure with normal LVEDP.   Widely patent stents in the RCA and LAD from this  morning. Improved systolic hypertension with now normal LVEDP.  Most likely etiology for her chest pain was profound systemic hypertension with existing CAD.      Patient Profile      62 y.o. female with a hx of CAD s/p remote MIs with PCI to the RCA and LAD, chronic diastolic HF, P8KD, HTN, HLD and obesity, who is being followed by cardiology for the evaluation of chest pain and mildly elevated tropoinin   Assessment & Plan    1. Chest pain in patient with CAD: somewhat atypical features more consistent with GI origin. She had some relief with antacids and GI cocktail, now on H2 blocker and has not had a recurrence of symptoms since starting this therapy. She took her metoprolol while in nuclear medicine for elevated BP's and noted some dysphagia and sensation of pills being stuck in her throat. This improved with drinking ginger ale. No chest pain during NST.  - Await NST results - if negative, do not anticipate further cardiac work up - Continue home antihypertensive/ CHF medications - she appeared euvolemic on exam today. BP elevated but she had not received AM medications yet.   For questions or updates, please contact [place] Please consult [email or web address] for contact info under Cardiology/STEMI.      Signed, Abigail Butts, PA-C  07/20/2018, 12:01 PM   [phone]  Attending Note:   The patient was seen and examined.  Agree with assessment and plan as noted above.  Changes made to the above note as needed.  Patient seen and independently examined with Daleen Snook Droeger, PA .   We discussed all aspects of the encounter. I agree with the assessment and plan as stated above.  1.  Chest pain: The patient has a history of coronary artery disease.  She now presents with fairly atypical chest pain.  Pain was originally relieved with antacids and a GI cocktail.  Troponin levels are flat and are not c/w ACS.  She has had a myoview stress test this morning.  She should be able  to go home if the stress test is negative / low risk     I have spent a total of 40 minutes with patient reviewing hospital  notes , telemetry, EKGs, labs and examining patient as well as establishing an assessment and plan that was discussed with the patient. > 50% of time was spent in direct patient care.     Thayer Headings, Brooke Bonito., MD, [facility] 07/20/2018, 1:16 PM [address],  [place] Pager [phone]

## 2018-07-20 NOTE — Progress Notes (Signed)
   NST results are as follows: 1. Large region of moderate severity inducible ischemia in the anterior wall. Moderate-sized region of moderate severity inducible ischemia in the lateral wall. There is also underlying scarring in the anterior and lateral walls. Mild reverse redistribution in the septum may relate to prior angioplasty/stent placement.  2. Lateral wall hypokinesis.  3. Left ventricular ejection fraction 49%  4. Non invasive risk stratification*: High  Given concern for ischemia, will plan for [facility] tomorrow. The patient understands that risks included but are not limited to stroke (1 in 1000), death (1 in 40), kidney failure [usually temporary] (1 in 500), bleeding (1 in 200), allergic reaction [possibly serious] (1 in 200).  The patient understands and agrees to proceed. Discussed with Dr. Acie Fredrickson.    Abigail Butts, PA-C

## 2018-07-20 NOTE — Progress Notes (Signed)
PROGRESS NOTE                                                                                                                                                                                                             Patient Demographics:    [name], is a 62 y.o. female, DOB - 03-06-1956, GPQ:[id number]  Admit date - 07/18/2018   Admitting Physician Toy Baker, MD  Outpatient Primary MD for the patient is [name], MD  LOS - 0   Chief Complaint  Patient presents with  . Chest Pain       Brief Narrative   62 y.o. female with medical history significant of CAD , DM2, HLD, diastolic CHF, GERD, with history of CAD, status post 2 stent placed 2016, presents with chest pain.   Subjective:    [name] today has, No headache,No abdominal pain - No Nausea, she denies any further  chest pain .  Assessment  & Plan :    Active Problems:   Type 2 diabetes mellitus, uncontrolled ([facility])   Essential hypertension   CAD -S/P RCA DES 08/22/15   Hypertensive cardiovascular disease   Dyslipidemia   Chronic diastolic CHF (congestive heart failure) ([facility])   Chest pain    Chest pain  -With known history of CAD, status post 2 stents in 2016, cardiology input greatly appreciated, stress test this morning high risk, with evidence of inducible ischemia in anterior and lateral wall, discussed with cardiology, plan for cardiac cath tomorrow -Troponins flat, non-ACS pattern. -D-dimers within normal limit  Chronic diastolic CHF - Currently stable continue home dose Lasix  Dyslipidemia  - continue Lipitor 80 mg  Essential hypertension  -Overall blood pressure acceptable uncontrolled, continue with current meds, had couple episodes of uncontrolled hypertension during her stress test, but this all resolved now  CAD  - chronic, continue aspirin and statin and beta blocker  Type 2 diabetes mellitus,  -Hold home regimen, continue with  insulin sliding scale during hospital stay   Right leg edema  -Venous Doppler negative for DVT  Hyperkalemia -Given Kayexalate, resolved      Code Status : Full  Family Communication  : none at bedside  Disposition Plan  : Home when stable  Consults  : Cardioloy  Procedures  : None  DVT Prophylaxis  :  lovenox  Lab  Results  Component Value Date   PLT 240 07/20/2018    Antibiotics  :    Anti-infectives (From admission, onward)   None        Objective:   Vitals:   07/20/18 1148 07/20/18 1151 07/20/18 1153 07/20/18 1626  BP: (!) 214/126  (!) 186/106 127/70  Pulse:  86    Resp:      Temp:      TempSrc:      SpO2:      Weight:      Height:        Wt Readings from Last 3 Encounters:  07/20/18 94.8 kg (209 lb 1.6 oz)  02/17/18 89.3 kg (196 lb 12.8 oz)  11/18/17 89.9 kg (198 lb 3.2 oz)     Intake/Output Summary (Last 24 hours) at 07/20/2018 1635 Last data filed at 07/20/2018 1300 Gross per 24 hour  Intake 340 ml  Output 200 ml  Net 140 ml     Physical Exam  Awake Alert, Oriented X 3, No new F.N deficits, Normal affect Symmetrical Chest wall movement, Good air movement bilaterally, CTAB RRR,No Gallops,Rubs or new Murmurs, No Parasternal Heave +ve B.Sounds, Abd Soft, No tenderness, No rebound - guarding or rigidity. No Cyanosis, improved lower extremity edema, No new Rash or bruise       Data Review:    CBC Recent Labs  Lab 07/18/18 1713 07/20/18 0544  WBC 6.6 6.3  HGB 14.9 15.1*  HCT 45.8 46.9*  PLT 260 240  MCV 93.5 92.7  MCH 30.4 29.8  MCHC 32.5 32.2  RDW 13.3 13.4    Chemistries  Recent Labs  Lab 07/18/18 1713 07/20/18 0544  NA 137 140  K 5.3* 4.5  CL 102 107  CO2 24 23  GLUCOSE 216* 216*  BUN 26* 27*  CREATININE 0.95 0.86  CALCIUM 9.3 9.0   ------------------------------------------------------------------------------------------------------------------ No results for input(s): CHOL, HDL, LDLCALC, TRIG, CHOLHDL,  LDLDIRECT in the last 72 hours.  Lab Results  Component Value Date   HGBA1C 8.1 (H) 07/19/2018   ------------------------------------------------------------------------------------------------------------------ No results for input(s): TSH, T4TOTAL, T3FREE, THYROIDAB in the last 72 hours.  Invalid input(s): FREET3 ------------------------------------------------------------------------------------------------------------------ No results for input(s): VITAMINB12, FOLATE, FERRITIN, TIBC, IRON, RETICCTPCT in the last 72 hours.  Coagulation profile No results for input(s): INR, PROTIME in the last 168 hours.  Recent Labs    07/18/18 2045  DDIMER <0.27    Cardiac Enzymes Recent Labs  Lab 07/18/18 2045 07/19/18 0302 07/19/18 0806  TROPONINI 0.05* 0.05* 0.04*   ------------------------------------------------------------------------------------------------------------------    Component Value Date/Time   BNP 80.6 08/27/2015 0632    Inpatient Medications  Scheduled Meds: . aspirin  81 mg Oral Daily  . atorvastatin  80 mg Oral q1800  . enoxaparin (LOVENOX) injection  40 mg Subcutaneous Q24H  . furosemide  40 mg Oral Daily  . insulin aspart  0-5 Units Subcutaneous QHS  . insulin aspart  0-9 Units Subcutaneous TID WC  . isosorbide mononitrate  30 mg Oral Daily  . lisinopril  40 mg Oral Daily  . metoprolol tartrate  100 mg Oral BID  . regadenoson      . sodium polystyrene  15 g Oral Once   Continuous Infusions: PRN Meds:.acetaminophen, alum & mag hydroxide-simeth, famotidine, morphine injection, ondansetron (ZOFRAN) IV  Micro Results No results found for this or any previous visit (from the past 240 hour(s)).  Radiology Reports Dg Chest 2 View  Result Date: 07/18/2018 CLINICAL DATA:  [name] mid chest pain  radiating to both shoulders for 2 days, shortness of breath, nausea, dizziness, lightheadedness, history coronary artery disease post MI and coronary stenting,  diabetes mellitus, hypertension EXAM: CHEST - 2 VIEW COMPARISON:  08/26/2015 FINDINGS: Normal heart size, mediastinal contours, and pulmonary vascularity. Atherosclerotic calcification aorta. Chronic bronchitic changes without infiltrate, pleural effusion or pneumothorax. Bones demineralized. IMPRESSION: Mild chronic bronchitic changes without infiltrate. Electronically Signed   By: Lavonia Dana M.D.   On: 07/18/2018 17:55   Nm Myocar Multi W/spect W/wall Motion / Ef  Result Date: 07/20/2018 CLINICAL DATA:  Chest pain. Prior coronary stent. Diabetes. Hypertension. Hyperlipidemia. EXAM: MYOCARDIAL IMAGING WITH SPECT (REST AND PHARMACOLOGIC-STRESS) GATED LEFT VENTRICULAR WALL MOTION STUDY LEFT VENTRICULAR EJECTION FRACTION TECHNIQUE: Standard myocardial SPECT imaging was performed after resting intravenous injection of 10 mCi Tc-78m tetrofosmin. Subsequently, intravenous infusion of Lexiscan was performed under the supervision of the Cardiology staff. At peak effect of the drug, 30 mCi Tc-34m tetrofosmin was injected intravenously and standard myocardial SPECT imaging was performed. Quantitative gated imaging was also performed to evaluate left ventricular wall motion, and estimate left ventricular ejection fraction. COMPARISON:  01/29/2010 FINDINGS: Perfusion: Anterior wall large region of moderate severity inducible ischemia extending from the apex to the base. Lateral wall moderate-sized region of moderate inducible ischemia especially anteriorly. There is also some underlying scarring in both of these regions. Mild reverse redistribution along the septum. Wall Motion: Lateral hypokinesis. Mild scattered poor wall thickening. Left Ventricular Ejection Fraction: 49 % End diastolic volume 248 ml End systolic volume 53 ml, mildly elevated. IMPRESSION: 1. Large region of moderate severity inducible ischemia in the anterior wall. Moderate-sized region of moderate severity inducible ischemia in the lateral wall. There  is also underlying scarring in the anterior and lateral walls. Mild reverse redistribution in the septum may relate to prior angioplasty/stent placement. 2. Lateral wall hypokinesis. 3. Left ventricular ejection fraction 49% 4. Non invasive risk stratification*: High *2012 Appropriate Use Criteria for Coronary Revascularization Focused Update: J Am Coll Cardiol. 2500;37(0):488-891. http://content.airportbarriers.com.aspx?articleid=1201161 These results were called by telephone at the time of interpretation on 07/20/2018 at 4:30 pm to Dr. Urban Gibson, who verbally acknowledged these results. Electronically Signed   By: Van Clines M.D.   On: 07/20/2018 16:31     Phillips Climes M.D on 07/20/2018 at 4:35 PM  Between 7am to 7pm - Pager - [phone]  After 7pm go to [email or web address] - password [facility]  [facility] -  Office  [phone]

## 2018-07-20 NOTE — Progress Notes (Signed)
   [name] presented for a nuclear stress test today.  No immediate complications.  Stress imaging is pending at this time.  Preliminary EKG findings may be listed in the chart, but the stress test result will not be finalized until perfusion imaging is complete.  One day study.  Abigail Butts, PA-C 07/20/2018, 12:08 PM

## 2018-07-20 NOTE — H&P (View-Only) (Signed)
   NST results are as follows: 1. Large region of moderate severity inducible ischemia in the anterior wall. Moderate-sized region of moderate severity inducible ischemia in the lateral wall. There is also underlying scarring in the anterior and lateral walls. Mild reverse redistribution in the septum may relate to prior angioplasty/stent placement.  2. Lateral wall hypokinesis.  3. Left ventricular ejection fraction 49%  4. Non invasive risk stratification*: High  Given concern for ischemia, will plan for [facility] tomorrow. The patient understands that risks included but are not limited to stroke (1 in 1000), death (1 in 61), kidney failure [usually temporary] (1 in 500), bleeding (1 in 200), allergic reaction [possibly serious] (1 in 200).  The patient understands and agrees to proceed. Discussed with Dr. Acie Fredrickson.    Abigail Butts, PA-C

## 2018-07-21 ENCOUNTER — Encounter (HOSPITAL_COMMUNITY)
Admission: EM | Disposition: A | Discharge status: Home / Self Care | Payer: Self-pay | Source: Home / Self Care | Attending: Internal Medicine

## 2018-07-21 ENCOUNTER — Encounter (HOSPITAL_COMMUNITY): Payer: Self-pay | Admitting: Cardiovascular Disease

## 2018-07-21 ENCOUNTER — Ambulatory Visit: Payer: Self-pay | Admitting: Family Medicine

## 2018-07-21 DIAGNOSIS — E785 Hyperlipidemia, unspecified: Secondary | ICD-10-CM

## 2018-07-21 DIAGNOSIS — I25119 Atherosclerotic heart disease of native coronary artery with unspecified angina pectoris: Principal | ICD-10-CM

## 2018-07-21 DIAGNOSIS — I10 Essential (primary) hypertension: Secondary | ICD-10-CM

## 2018-07-21 DIAGNOSIS — I5032 Chronic diastolic (congestive) heart failure: Secondary | ICD-10-CM

## 2018-07-21 HISTORY — PX: LEFT HEART CATH AND CORONARY ANGIOGRAPHY: CATH118249

## 2018-07-21 LAB — BASIC METABOLIC PANEL
Anion gap: 9 (ref 5–15)
BUN: 30 mg/dL — AB (ref 6–20)
CO2: 24 mmol/L (ref 22–32)
Calcium: 8.9 mg/dL (ref 8.9–10.3)
Chloride: 106 mmol/L (ref 98–111)
Creatinine, Ser: 0.93 mg/dL (ref 0.44–1.00)
GFR calc Af Amer: >60 mL/min (ref >60)
Glucose, Bld: 263 mg/dL — ABNORMAL HIGH (ref 70–99)
Potassium: 4.6 mmol/L (ref 3.5–5.1)
SODIUM: 139 mmol/L (ref 135–145)

## 2018-07-21 LAB — CBC
HCT: 44.4 % (ref 36.0–46.0)
Hemoglobin: 14.1 g/dL (ref 12.0–15.0)
MCH: 29.6 pg (ref 26.0–34.0)
MCHC: 31.8 g/dL (ref 30.0–36.0)
MCV: 93.1 fL (ref 78.0–100.0)
Platelets: 229 10*3/uL (ref 150–400)
RBC: 4.77 MIL/uL (ref 3.87–5.11)
RDW: 13.2 % (ref 11.5–15.5)
WBC: 6.4 10*3/uL (ref 4.0–10.5)

## 2018-07-21 LAB — GLUCOSE, CAPILLARY
Glucose-Capillary: 230 mg/dL — ABNORMAL HIGH (ref 70–99)
Glucose-Capillary: 254 mg/dL — ABNORMAL HIGH (ref 70–99)
Glucose-Capillary: 319 mg/dL — ABNORMAL HIGH (ref 70–99)

## 2018-07-21 SURGERY — LEFT HEART CATH AND CORONARY ANGIOGRAPHY
Anesthesia: LOCAL

## 2018-07-21 MED ORDER — CLOPIDOGREL BISULFATE 75 MG PO TABS: 75.0000 mg | ORAL_TABLET | Freq: Every day | ORAL | 0 refills | Status: DC

## 2018-07-21 MED ORDER — CLOPIDOGREL BISULFATE 75 MG PO TABS: 75.0000 mg | ORAL_TABLET | Freq: Every day | ORAL | Status: DC

## 2018-07-21 MED ORDER — LIDOCAINE HCL (PF) 1 % IJ SOLN
INTRAMUSCULAR | Status: AC
  Filled 2018-07-21: qty 30

## 2018-07-21 MED ORDER — METFORMIN HCL 500 MG PO TABS: 1000.0000 mg | ORAL_TABLET | Freq: Two times a day (BID) | ORAL | 0 refills | Status: DC

## 2018-07-21 MED ORDER — FENTANYL CITRATE (PF) 100 MCG/2ML IJ SOLN
INTRAMUSCULAR | Status: DC | PRN
  Administered 2018-07-21 (×2): 25 ug via INTRAVENOUS

## 2018-07-21 MED ORDER — VERAPAMIL HCL 2.5 MG/ML IV SOLN
INTRAVENOUS | Status: AC
  Filled 2018-07-21: qty 2

## 2018-07-21 MED ORDER — SODIUM CHLORIDE 0.9% FLUSH: 3.0000 mL | Freq: Two times a day (BID) | INTRAVENOUS | Status: DC

## 2018-07-21 MED ORDER — HEPARIN (PORCINE) IN NACL 1000-0.9 UT/500ML-% IV SOLN
INTRAVENOUS | Status: AC
  Filled 2018-07-21: qty 1000

## 2018-07-21 MED ORDER — VERAPAMIL HCL 2.5 MG/ML IV SOLN
INTRAVENOUS | Status: DC | PRN
  Administered 2018-07-21: 08:00:00 via INTRA_ARTERIAL

## 2018-07-21 MED ORDER — SODIUM CHLORIDE 0.9 % IV SOLN: 250.0000 mL | INTRAVENOUS | Status: DC | PRN

## 2018-07-21 MED ORDER — ONDANSETRON HCL 4 MG/2ML IJ SOLN: 4.0000 mg | Freq: Four times a day (QID) | INTRAMUSCULAR | Status: DC | PRN

## 2018-07-21 MED ORDER — MIDAZOLAM HCL 2 MG/2ML IJ SOLN
INTRAMUSCULAR | Status: DC | PRN
  Administered 2018-07-21: 2 mg via INTRAVENOUS
  Administered 2018-07-21: 1 mg via INTRAVENOUS

## 2018-07-21 MED ORDER — PANTOPRAZOLE SODIUM 40 MG PO TBEC: 40.0000 mg | DELAYED_RELEASE_TABLET | Freq: Every day | ORAL | 0 refills | Status: DC

## 2018-07-21 MED ORDER — HEPARIN (PORCINE) IN NACL 1000-0.9 UT/500ML-% IV SOLN
INTRAVENOUS | Status: DC | PRN
  Administered 2018-07-21: 500 mL

## 2018-07-21 MED ORDER — ATORVASTATIN CALCIUM 80 MG PO TABS
80.0000 mg | ORAL_TABLET | Freq: Every day | ORAL | Status: DC
  Administered 2018-07-21: 80 mg via ORAL
  Filled 2018-07-21: qty 1

## 2018-07-21 MED ORDER — MIDAZOLAM HCL 2 MG/2ML IJ SOLN
INTRAMUSCULAR | Status: AC
  Filled 2018-07-21: qty 2

## 2018-07-21 MED ORDER — ISOSORBIDE MONONITRATE ER 30 MG PO TB24: 60.0000 mg | ORAL_TABLET | Freq: Every day | ORAL | 0 refills | Status: DC

## 2018-07-21 MED ORDER — SODIUM CHLORIDE 0.9% FLUSH: 3.0000 mL | INTRAVENOUS | Status: DC | PRN

## 2018-07-21 MED ORDER — HEPARIN SODIUM (PORCINE) 1000 UNIT/ML IJ SOLN
INTRAMUSCULAR | Status: DC | PRN
  Administered 2018-07-21: 5000 [IU] via INTRAVENOUS

## 2018-07-21 MED ORDER — DIAZEPAM 5 MG PO TABS: 5.0000 mg | ORAL_TABLET | ORAL | Status: DC | PRN

## 2018-07-21 MED ORDER — ASPIRIN 81 MG PO CHEW: 81.0000 mg | CHEWABLE_TABLET | Freq: Every day | ORAL | Status: DC

## 2018-07-21 MED ORDER — ENOXAPARIN SODIUM 40 MG/0.4ML ~~LOC~~ SOLN: 40.0000 mg | SUBCUTANEOUS | Status: DC

## 2018-07-21 MED ORDER — ACETAMINOPHEN 325 MG PO TABS: 650.0000 mg | ORAL_TABLET | ORAL | Status: DC | PRN

## 2018-07-21 MED ORDER — SODIUM CHLORIDE 0.9 % WEIGHT BASED INFUSION
1.0000 mL/kg/h | INTRAVENOUS | Status: DC
  Administered 2018-07-21: 1 mL/kg/h via INTRAVENOUS

## 2018-07-21 MED ORDER — IOHEXOL 350 MG/ML SOLN
INTRAVENOUS | Status: DC | PRN
  Administered 2018-07-21: 100 mL via INTRA_ARTERIAL

## 2018-07-21 MED ORDER — FENTANYL CITRATE (PF) 100 MCG/2ML IJ SOLN
INTRAMUSCULAR | Status: AC
  Filled 2018-07-21: qty 2

## 2018-07-21 MED ORDER — LIDOCAINE HCL (PF) 1 % IJ SOLN
INTRAMUSCULAR | Status: DC | PRN
  Administered 2018-07-21: 2 mL

## 2018-07-21 MED ORDER — HEPARIN SODIUM (PORCINE) 1000 UNIT/ML IJ SOLN
INTRAMUSCULAR | Status: AC
  Filled 2018-07-21: qty 1

## 2018-07-21 SURGICAL SUPPLY — 13 items
CATH INFINITI 5 FR JL3.5 (CATHETERS) ×2 IMPLANT
CATH LAUNCHER 5F EBU3.5 (CATHETERS) ×2 IMPLANT
CATH OPTITORQUE TIG 4.0 5F (CATHETERS) ×2 IMPLANT
CATH VISTA GUIDE 6FR XBLAD3.5 (CATHETERS) ×4 IMPLANT
DEVICE RAD COMP TR BAND LRG (VASCULAR PRODUCTS) ×2 IMPLANT
GLIDESHEATH SLEND SS 6F .021 (SHEATH) ×2 IMPLANT
GUIDEWIRE INQWIRE 1.5J.035X260 (WIRE) ×1 IMPLANT
INQWIRE 1.5J .035X260CM (WIRE) ×2
KIT HEART LEFT (KITS) ×2 IMPLANT
PACK CARDIAC CATHETERIZATION (CUSTOM PROCEDURE TRAY) ×2 IMPLANT
SHEATH PROBE COVER 6X72 (BAG) ×2 IMPLANT
TRANSDUCER W/STOPCOCK (MISCELLANEOUS) ×2 IMPLANT
TUBING CIL FLEX 10 FLL-RA (TUBING) ×2 IMPLANT

## 2018-07-21 NOTE — Discharge Summary (Signed)
[name], is a 62 y.o. female  DOB January 01, 1956  MRN [id number].  Admission date:  07/18/2018  Admitting Physician  Toy Baker, MD  Discharge Date:  07/21/2018   Primary MD  [name], MD  Recommendations for primary care physician for things to follow:  -Please check CBC, BMP to ensure stable renal function after cardiac cath -Patient instructed to hold her metformin for the next 2 days as she received IV contrast. -Plavix was added on discharge per cardiology recommendation, was given prescription for 30 days, she is to follow with her primary cardiologist about decision for dual antiplatelet therapy   Admission Diagnosis  Precordial chest pain [R07.2]   Discharge Diagnosis  Precordial chest pain [R07.2]    Active Problems:   Type 2 diabetes mellitus, uncontrolled ([place])   Essential hypertension   CAD -S/P RCA DES 08/22/15   Hypertensive cardiovascular disease   Dyslipidemia   Chronic diastolic CHF (congestive heart failure) ([place])   Chest pain   Coronary artery disease involving native coronary artery of native heart with angina pectoris [facility])      Past Medical History:  Diagnosis Date  . Anginal pain ([place])   . Coronary atherosclerosis of native coronary artery, with stent to LCX in 2006 06/04/2013  . Diabetes mellitus without complication ([place])   . GERD (gastroesophageal reflux disease)   . Heart attack ([place])   . Hyperlipidemia LDL goal < 70 06/07/2013  . Hypertension   . S/P coronary artery stent placement to RCA with PROMUS DES, 06/07/13, residual disease on OM1, OM2 and rPDA 06/07/2013    Past Surgical History:  Procedure Laterality Date  . CARDIAC CATHETERIZATION N/A 08/22/2015   Procedure: Left Heart Cath and Coronary Angiography;  Surgeon: Leonie Man, MD;  Location: [place] CV LAB;  Service: Cardiovascular;  Laterality: N/A;  . CARDIAC CATHETERIZATION N/A 08/22/2015    Procedure: Coronary Stent Intervention;  Surgeon: Leonie Man, MD;  Location: [place] [facility];  Service: Cardiovascular;  Laterality: N/A;  . CARDIAC CATHETERIZATION N/A 08/22/2015   Procedure: Left Heart Cath and Coronary Angiography;  Surgeon: Leonie Man, MD;  Location: [place] [facility];  Service: Cardiovascular;  Laterality: N/A;  . CORONARY STENT PLACEMENT  2006   TAXUS stent CFX in [facility]  . LEFT HEART CATHETERIZATION WITH CORONARY ANGIOGRAM N/A 06/07/2013   Procedure: LEFT HEART CATHETERIZATION WITH CORONARY ANGIOGRAM;  Surgeon: Sanda Klein, MD;  Location: [place] CATH LAB;  Service: Cardiovascular;  Laterality: N/A;       History of present illness and  Hospital Course:     Kindly see H&P for history of present illness and admission details, please review complete Labs, Consult reports and Test reports for all details in brief  HPI  from the history and physical done on the day of admission 07/18/2018  [name] is a 62 y.o. female with medical history significant of CAD , DM2, HLD, diastolic CHF, GERD,   Presented with 2 days of Sharp chest pain radiating  to arms bilateral  Associated with shortness of breath and nausea.  Pain described as pressure-like Described as pressure-like started while patient was at work she works as a Scientist, water quality.     The pain  became constant today associated tingling to both arms. Similar to prior angina 2 years ago. NO difference in what she is eating. She flew to [place] last weak. Reports her right leg has been swelling in the past 1 week.  For past 2 days she has been having intermittent pain for the past day she's been having intermittent pain and occasional episodes of nausea and diaphoresis and occasional diaphoresis does not seem to be  made worse by exertion or deep breaths  Pt got ASA 324 mg and 2 of NTG with pain improving from 10 down to 3/10.  Hx od CAD sp DES September 2016 Regarding pertinent Chronic problems:    LAst echo  in 2016 showed grade 1 diastolic CHF and preserved EF Hx of DM 2 last A1c is 8.0 on metformin not on insulin  While in ER:  Given GI cocktail with good response     Hospital Course     Chest pain -With known history of CAD, status post 2 stents in 2016, cardiology input greatly appreciated, stress test during hospital stay was high risk, with evidence of inducible ischemia in anterior and lateral wall, patient went for cardiac cath 07/21/2018, She has a distal circumflex complete occlusion and a complete occlusion of the first diagonal artery with very sluggish antegrade flow.there is no role for PCI, and medical therapy is indicated . -Per cardiology recommendation her Imdur was increased from 30 to 60 mg oral daily, aspirin, Plavix was added by interventional, discussed with cardiology, she will be discharged on both aspirin and Plavix, she is on statin lisinopril and beta-blockers . -Started on PPI as on aspirin and Plavix, I have asked her to stop using NSAIDs -Troponins flat, non-ACS pattern. -D-dimers within normal limit  Known history of CAD -Please see above discussion   Chronic diastolic CHF - Currently stable continue home dose Lasix  Dyslipidemia - continue Lipitor 80 mg  Essential hypertension -Overall blood pressure acceptable.  Type 2 diabetes mellitus,  -Continue with home regimen, she was instructed to hold metformin for the next 2 days given IV contrast with cardiac cath   Right leg edema  -Venous Doppler negative for DVT  Hyperkalemia -Given Kayexalate, resolved        Discharge Condition:  Stable   Follow UP  Follow-up Information    [name], MD Follow up in 1 week(s).   Specialty:  Family Medicine Contact information: [place] [address] [phone]        Sanda Klein, MD .   Specialty:  Cardiology Contact information: [address] [place]  [address] [phone]             Discharge Instructions  and  Discharge Medications     Discharge Instructions    Discharge instructions   Complete by:  As directed    Follow with Primary MD [name], MD in 7 days   Get CBC, CMP,checked  by Primary MD next visit.    Activity: As tolerated with Full fall precautions use walker/cane & assistance as needed   Disposition Home    Diet: Heart Healthy , carbohydrate modified, salt, with feeding assistance and aspiration precautions.  For Heart failure patients - Check your Weight same time everyday, if you gain  over 2 pounds, or you develop in leg swelling, experience more shortness of breath or chest pain, call your Primary MD immediately. Follow Cardiac Low Salt Diet and 1.5 lit/day fluid restriction.   On your next visit with your primary care physician please Get Medicines reviewed and adjusted.   Please request your Prim.MD to go over all Hospital Tests and Procedure/Radiological results at the follow up, please get all Hospital records sent to your Prim MD by signing hospital release before you go home.   If you experience worsening of your admission symptoms, develop shortness of breath, life threatening emergency, suicidal or homicidal thoughts you must seek medical attention immediately by calling 911 or calling your MD immediately  if symptoms less severe.  You Must read complete instructions/literature along with all the possible adverse reactions/side effects for all the Medicines you take and that have been prescribed to you. Take any new Medicines after you have completely understood and accpet all the possible adverse reactions/side effects.   Do not drive, operating heavy machinery, perform activities at heights, swimming or participation in water activities or provide baby sitting services if your were admitted for syncope or siezures until you have seen by Primary MD or a Neurologist and advised to do so  again.  Do not drive when taking Pain medications.    Do not take more than prescribed Pain, Sleep and Anxiety Medications  Special Instructions: If you have smoked or chewed Tobacco  in the last 2 yrs please stop smoking, stop any regular Alcohol  and or any Recreational drug use.  Wear Seat belts while driving.   Please note  You were cared for by a hospitalist during your hospital stay. If you have any questions about your discharge medications or the care you received while you were in the hospital after you are discharged, you can call the unit and asked to speak with the hospitalist on call if the hospitalist that took care of you is not available. Once you are discharged, your primary care physician will handle any further medical issues. Please note that NO REFILLS for any discharge medications will be authorized once you are discharged, as it is imperative that you return to your primary care physician (or establish a relationship with a primary care physician if you do not have one) for your aftercare needs so that they can reassess your need for medications and monitor your lab values.   Increase activity slowly   Complete by:  As directed      Allergies as of 07/21/2018   No Known Allergies     Medication List    STOP taking these medications   cetirizine 10 MG tablet Commonly known as:  ZYRTEC   ibuprofen 200 MG tablet Commonly known as:  ADVIL,MOTRIN     TAKE these medications   acetaminophen 500 MG tablet Commonly known as:  TYLENOL Take 1,000 mg by mouth daily as needed for moderate pain.   aspirin 81 MG EC tablet Take 1 tablet (81 mg total) by mouth daily.   atorvastatin 80 MG tablet Commonly known as:  LIPITOR TAKE 1 TABLET BY MOUTH DAILY AT 6 PM   canagliflozin 100 MG Tabs tablet Commonly known as:  INVOKANA Take 1 tablet (100 mg total) by mouth daily.   clopidogrel 75 MG tablet Commonly known as:  PLAVIX Take 1 tablet (75 mg total) by mouth daily  with breakfast. Start taking on:  07/22/2018   furosemide 20 MG tablet Commonly known as:  LASIX Take 2 tablets (40 mg total) by mouth daily.   glimepiride 4 MG tablet Commonly known as:  AMARYL TAKE 1 TABLET BY MOUTH 2 TIMES DAILY WITH A MEAL.   isosorbide mononitrate 30 MG 24 hr tablet Commonly known as:  IMDUR Take 2 tablets (60 mg total) by mouth daily. What changed:  how much to take   lisinopril 40 MG tablet Commonly known as:  PRINIVIL,ZESTRIL Take 1 tablet (40 mg total) by mouth daily.   metFORMIN 500 MG tablet Commonly known as:  GLUCOPHAGE Take 2 tablets (1,000 mg total) by mouth 2 (two) times daily with a meal. Hold for next 2 days, then resume on 07/24/2018 as you received IV contrast with your cardiac cath What changed:  additional instructions   metoprolol tartrate 100 MG tablet Commonly known as:  LOPRESSOR Take 1 tablet (100 mg total) by mouth 2 (two) times daily.   nitroGLYCERIN 0.4 MG SL tablet Commonly known as:  NITROSTAT Place 1 tablet (0.4 mg total) under the tongue every 5 (five) minutes as needed for chest pain.   pantoprazole 40 MG tablet Commonly known as:  PROTONIX Take 1 tablet (40 mg total) by mouth daily.   ranitidine 150 MG capsule Commonly known as:  ZANTAC Take 150 mg by mouth daily.         Diet and Activity recommendation: See Discharge Instructions above   Consults obtained - Cardiology   Major procedures and Radiology Reports - PLEASE review detailed and final reports for all details, in brief -   Cardiac cath by Dr. Shelva Majestic 07/21/2018   Dg Chest 2 View  Result Date: 07/18/2018 CLINICAL DATA:  Sharp mid chest pain radiating to both shoulders for 2 days, shortness of breath, nausea, dizziness, lightheadedness, history coronary artery disease post MI and coronary stenting, diabetes mellitus, hypertension EXAM: CHEST - 2 VIEW COMPARISON:  08/26/2015 FINDINGS: Normal heart size, mediastinal contours, and pulmonary vascularity.  Atherosclerotic calcification aorta. Chronic bronchitic changes without infiltrate, pleural effusion or pneumothorax. Bones demineralized. IMPRESSION: Mild chronic bronchitic changes without infiltrate. Electronically Signed   By: Lavonia Dana M.D.   On: 07/18/2018 17:55   Nm Myocar Multi W/spect W/wall Motion / Ef  Result Date: 07/20/2018 CLINICAL DATA:  Chest pain. Prior coronary stent. Diabetes. Hypertension. Hyperlipidemia. EXAM: MYOCARDIAL IMAGING WITH SPECT (REST AND PHARMACOLOGIC-STRESS) GATED LEFT VENTRICULAR WALL MOTION STUDY LEFT VENTRICULAR EJECTION FRACTION TECHNIQUE: Standard myocardial SPECT imaging was performed after resting intravenous injection of 10 mCi Tc-45m tetrofosmin. Subsequently, intravenous infusion of Lexiscan was performed under the supervision of the Cardiology staff. At peak effect of the drug, 30 mCi Tc-26m tetrofosmin was injected intravenously and standard myocardial SPECT imaging was performed. Quantitative gated imaging was also performed to evaluate left ventricular wall motion, and estimate left ventricular ejection fraction. COMPARISON:  01/29/2010 FINDINGS: Perfusion: Anterior wall large region of moderate severity inducible ischemia extending from the apex to the base. Lateral wall moderate-sized region of moderate inducible ischemia especially anteriorly. There is also some underlying scarring in both of these regions. Mild reverse redistribution along the septum. Wall Motion: Lateral hypokinesis. Mild scattered poor wall thickening. Left Ventricular Ejection Fraction: 49 % End diastolic volume 737 ml End systolic volume 53 ml, mildly elevated. IMPRESSION: 1. Large region of moderate severity inducible ischemia in the anterior wall. Moderate-sized region of moderate severity inducible ischemia in the lateral wall. There is also underlying scarring in the anterior and lateral walls. Mild reverse redistribution in the septum may relate to prior angioplasty/stent  placement.  2. Lateral wall hypokinesis. 3. Left ventricular ejection fraction 49% 4. Non invasive risk stratification*: High *2012 Appropriate Use Criteria for Coronary Revascularization Focused Update: J Am Coll Cardiol. 3154;00(8):676-195. http://content.airportbarriers.com.aspx?articleid=1201161 These results were called by telephone at the time of interpretation on 07/20/2018 at 4:30 pm to Dr. Urban Gibson, who verbally acknowledged these results. Electronically Signed   By: Van Clines M.D.   On: 07/20/2018 16:31    Micro Results     No results found for this or any previous visit (from the past 240 hour(s)).     Today   Subjective:   [name] today has no headache,no chest abdominal pain,no new weakness tingling or numbness, feels much better wants to go home today.   Objective:   Blood pressure (!) 142/74, pulse (!) 54, temperature 98.1 F (36.7 C), temperature source Oral, resp. rate 11, height 5\' 2"  (1.575 m), weight 95.6 kg (210 lb 12.8 oz), SpO2 100 %.   Intake/Output Summary (Last 24 hours) at 07/21/2018 1028 Last data filed at 07/21/2018 0602 Gross per 24 hour  Intake 727.58 ml  Output 300 ml  Net 427.58 ml    Exam Awake Alert, Oriented x 3, No new F.N deficits, Normal affect Symmetrical Chest wall movement, Good air movement bilaterally, CTAB RRR,No Gallops,Rubs or new Murmurs, No Parasternal Heave +ve B.Sounds, Abd Soft, Non tender, No rebound -guarding or rigidity. No Cyanosis, Clubbing or edema, No new Rash or bruise  Data Review   CBC w Diff:  Lab Results  Component Value Date   WBC 6.4 07/21/2018   HGB 14.1 07/21/2018   HCT 44.4 07/21/2018   PLT 229 07/21/2018   LYMPHOPCT 21 05/29/2015   MONOPCT 8 05/29/2015   EOSPCT 1 05/29/2015   BASOPCT 0 05/29/2015    CMP:  Lab Results  Component Value Date   NA 139 07/21/2018   NA 141 02/17/2018   K 4.6 07/21/2018   CL 106 07/21/2018   CO2 24 07/21/2018   BUN 30 (H) 07/21/2018   BUN 20 02/17/2018    CREATININE 0.93 07/21/2018   CREATININE 0.79 02/11/2017   PROT 7.3 02/17/2018   ALBUMIN 4.3 02/17/2018   BILITOT 0.3 02/17/2018   ALKPHOS 117 02/17/2018   AST 30 02/17/2018   ALT 35 (H) 02/17/2018  .   Total Time in preparing paper work, data evaluation and todays exam - 67 minutes  Phillips Climes M.D on 07/21/2018 at 10:28 AM  [facility]   Office  [phone]

## 2018-07-21 NOTE — Interval H&P Note (Signed)
Cath Lab Visit (complete for each Cath Lab visit)  Clinical Evaluation Leading to the Procedure:   ACS: No.  Non-ACS:    Anginal Classification: CCS IV  Anti-ischemic medical therapy: Maximal Therapy (2 or more classes of medications)  Non-Invasive Test Results: High-risk stress test findings: cardiac mortality >3%/year  Prior CABG: No previous CABG      History and Physical Interval Note:  07/21/2018 7:37 AM  [name]  has presented today for surgery, with the diagnosis of abnormal stress test  The various methods of treatment have been discussed with the patient and family. After consideration of risks, benefits and other options for treatment, the patient has consented to  Procedure(s): LEFT HEART CATH AND CORONARY ANGIOGRAPHY (N/A) as a surgical intervention .  The patient's history has been reviewed, patient examined, no change in status, stable for surgery.  I have reviewed the patient's chart and labs.  Questions were answered to the patient's satisfaction.     Shelva Majestic

## 2018-07-21 NOTE — Progress Notes (Addendum)
Discharge papers complete. TR band right radial deflated level 0. TR band will be removed at 4:30pm. IV removed. Patient denies pain.

## 2018-07-21 NOTE — Progress Notes (Signed)
Progress Note  Patient Name: [name] Date of Encounter: 07/21/2018  Primary Cardiologist: Sanda Klein, MD   Subjective   62 year old female with a history of coronary artery disease admitted with episodes of chest discomfort. Clear stress test was abnormal yesterday. Catheterization today revealed a patent stent in the right coronary artery and patent stent in the LAD.  She has a distal circumflex complete occlusion and a complete occlusion of the first diagonal artery with very sluggish antegrade flow. There is no role for PCI.  Medical therapy is indicated.  Inpatient Medications    Scheduled Meds: . [START ON 07/22/2018] aspirin  81 mg Oral Daily  . atorvastatin  80 mg Oral q1800  . [START ON 07/22/2018] clopidogrel  75 mg Oral Q breakfast  . [START ON 07/22/2018] enoxaparin (LOVENOX) injection  40 mg Subcutaneous Q24H  . furosemide  40 mg Oral Daily  . insulin aspart  0-5 Units Subcutaneous QHS  . insulin aspart  0-9 Units Subcutaneous TID WC  . isosorbide mononitrate  30 mg Oral Daily  . lisinopril  40 mg Oral Daily  . metoprolol tartrate  100 mg Oral BID  . sodium chloride flush  3 mL Intravenous Q12H  . sodium polystyrene  15 g Oral Once   Continuous Infusions: . sodium chloride    . sodium chloride     PRN Meds: sodium chloride, acetaminophen, alum & mag hydroxide-simeth, diazepam, famotidine, morphine injection, ondansetron (ZOFRAN) IV, sodium chloride flush   Vital Signs    Vitals:   07/21/18 0822 07/21/18 0822 07/21/18 0827 07/21/18 0907  BP: (!) 132/92 (!) 132/92 (!) 146/81 (!) 142/74  Pulse: 64 66 60 (!) 54  Resp: 15 15 11    Temp:      TempSrc:      SpO2: 98% 98% 97% 100%  Weight:      Height:        Intake/Output Summary (Last 24 hours) at 07/21/2018 0929 Last data filed at 07/21/2018 0602 Gross per 24 hour  Intake 727.58 ml  Output 300 ml  Net 427.58 ml   Filed Weights   07/19/18 0543 07/20/18 0550 07/21/18 0522  Weight: 208 lb 1.6 oz (94.4  kg) 209 lb 1.6 oz (94.8 kg) 210 lb 12.8 oz (95.6 kg)    Telemetry    NSR  - Personally Reviewed  ECG     NSR  - Personally Reviewed  Physical Exam   GEN:  Morbidly obese, middle-aged female, no acute distress Neck: No JVD Cardiac: RRR, no murmurs, rubs, or gallops.  Respiratory: Clear to auscultation bilaterally. GI: Soft, nontender, non-distended  MS:  Right radial cath  site appears to be stable.  TR band is in place. Neuro:  Nonfocal  Psych: Normal affect   Labs    Chemistry Recent Labs  Lab 07/18/18 1713 07/20/18 0544 07/21/18 0623  NA 137 140 139  K 5.3* 4.5 4.6  CL 102 107 106  CO2 24 23 24   GLUCOSE 216* 216* 263*  BUN 26* 27* 30*  CREATININE 0.95 0.86 0.93  CALCIUM 9.3 9.0 8.9  GFRNONAA >60 >60 >60  GFRAA >60 >60 >60  ANIONGAP 11 10 9      Hematology Recent Labs  Lab 07/18/18 1713 07/20/18 0544 07/21/18 0623  WBC 6.6 6.3 6.4  RBC 4.90 5.06 4.77  HGB 14.9 15.1* 14.1  HCT 45.8 46.9* 44.4  MCV 93.5 92.7 93.1  MCH 30.4 29.8 29.6  MCHC 32.5 32.2 31.8  RDW 13.3 13.4 13.2  PLT 260 240 229    Cardiac Enzymes Recent Labs  Lab 07/18/18 2045 07/19/18 0302 07/19/18 0806  TROPONINI 0.05* 0.05* 0.04*    Recent Labs  Lab 07/18/18 1740  TROPIPOC 0.05     BNPNo results for input(s): BNP, PROBNP in the last 168 hours.   DDimer  Recent Labs  Lab 07/18/18 2045  DDIMER <0.27     Radiology    Nm Myocar Multi W/spect W/wall Motion / Ef  Result Date: 07/20/2018 CLINICAL DATA:  Chest pain. Prior coronary stent. Diabetes. Hypertension. Hyperlipidemia. EXAM: MYOCARDIAL IMAGING WITH SPECT (REST AND PHARMACOLOGIC-STRESS) GATED LEFT VENTRICULAR WALL MOTION STUDY LEFT VENTRICULAR EJECTION FRACTION TECHNIQUE: Standard myocardial SPECT imaging was performed after resting intravenous injection of 10 mCi Tc-75m tetrofosmin. Subsequently, intravenous infusion of Lexiscan was performed under the supervision of the Cardiology staff. At peak effect of the drug, 30  mCi Tc-76m tetrofosmin was injected intravenously and standard myocardial SPECT imaging was performed. Quantitative gated imaging was also performed to evaluate left ventricular wall motion, and estimate left ventricular ejection fraction. COMPARISON:  01/29/2010 FINDINGS: Perfusion: Anterior wall large region of moderate severity inducible ischemia extending from the apex to the base. Lateral wall moderate-sized region of moderate inducible ischemia especially anteriorly. There is also some underlying scarring in both of these regions. Mild reverse redistribution along the septum. Wall Motion: Lateral hypokinesis. Mild scattered poor wall thickening. Left Ventricular Ejection Fraction: 49 % End diastolic volume 053 ml End systolic volume 53 ml, mildly elevated. IMPRESSION: 1. Large region of moderate severity inducible ischemia in the anterior wall. Moderate-sized region of moderate severity inducible ischemia in the lateral wall. There is also underlying scarring in the anterior and lateral walls. Mild reverse redistribution in the septum may relate to prior angioplasty/stent placement. 2. Lateral wall hypokinesis. 3. Left ventricular ejection fraction 49% 4. Non invasive risk stratification*: High *2012 Appropriate Use Criteria for Coronary Revascularization Focused Update: J Am Coll Cardiol. 9767;34(1):937-902. http://content.airportbarriers.com.aspx?articleid=1201161 These results were called by telephone at the time of interpretation on 07/20/2018 at 4:30 pm to Dr. Urban Gibson, who verbally acknowledged these results. Electronically Signed   By: Van Clines M.D.   On: 07/20/2018 16:31    Cardiac Studies      Patient Profile     62 y.o. female with history of CAD admitted with chest pain  Assessment & Plan   1.  Coronary artery disease: The patient's coronary artery disease is very stable.  She has a patent stent in her right coronary artery and LAD.  She has an occlusion of the distal  circumflex artery and a occlusion/subtotal occlusion of a diagonal vessel.  These vessels are too small to consider PCI.  We will increase the isosorbide to 60 mg a day.  I have advised her to continue to work on a diet, exercise, weight loss program. Her last lipid levels in March, 2019 looks good.  Her LDL is 66.  Triglyceride level is 103.  HDL is 42. Continue atorvastatin 80 mg a day.  [facility] will sign off.   Medication Recommendations:  Increase Imdur to 60 mg a day  Other recommendations (labs, testing, etc):   Follow up as an outpatient:  With Dr. Sallyanne Kuster or APP in 3-4 weeks.    For questions or updates, please contact [facility] Please consult [email or web address] for contact info under Cardiology/STEMI.      Signed, Mertie Moores, MD  07/21/2018, 9:29 AM

## 2018-07-21 NOTE — Plan of Care (Signed)
  Problem: Education: Goal: Understanding of cardiac disease, CV risk reduction, and recovery process will improve Outcome: Progressing   Problem: Cardiac: Goal: Ability to achieve and maintain adequate cardiovascular perfusion will improve Outcome: Progressing   

## 2018-07-21 NOTE — Discharge Instructions (Signed)
Follow with Primary MD [name], MD in 7 days   Get CBC, CMP,checked  by Primary MD next visit.    Activity: As tolerated with Full fall precautions use walker/cane & assistance as needed   Disposition Home    Diet: Heart Healthy , carbohydrate modified, salt, with feeding assistance and aspiration precautions.  For Heart failure patients - Check your Weight same time everyday, if you gain over 2 pounds, or you develop in leg swelling, experience more shortness of breath or chest pain, call your Primary MD immediately. Follow Cardiac Low Salt Diet and 1.5 lit/day fluid restriction.   On your next visit with your primary care physician please Get Medicines reviewed and adjusted.   Please request your Prim.MD to go over all Hospital Tests and Procedure/Radiological results at the follow up, please get all Hospital records sent to your Prim MD by signing hospital release before you go home.   If you experience worsening of your admission symptoms, develop shortness of breath, life threatening emergency, suicidal or homicidal thoughts you must seek medical attention immediately by calling 911 or calling your MD immediately  if symptoms less severe.  You Must read complete instructions/literature along with all the possible adverse reactions/side effects for all the Medicines you take and that have been prescribed to you. Take any new Medicines after you have completely understood and accpet all the possible adverse reactions/side effects.   Do not drive, operating heavy machinery, perform activities at heights, swimming or participation in water activities or provide baby sitting services if your were admitted for syncope or siezures until you have seen by Primary MD or a Neurologist and advised to do so again.  Do not drive when taking Pain medications.    Do not take more than prescribed Pain, Sleep and Anxiety Medications  Special Instructions: If you have smoked or chewed  Tobacco  in the last 2 yrs please stop smoking, stop any regular Alcohol  and or any Recreational drug use.  Wear Seat belts while driving.   Please note  You were cared for by a hospitalist during your hospital stay. If you have any questions about your discharge medications or the care you received while you were in the hospital after you are discharged, you can call the unit and asked to speak with the hospitalist on call if the hospitalist that took care of you is not available. Once you are discharged, your primary care physician will handle any further medical issues. Please note that NO REFILLS for any discharge medications will be authorized once you are discharged, as it is imperative that you return to your primary care physician (or establish a relationship with a primary care physician if you do not have one) for your aftercare needs so that they can reassess your need for medications and monitor your lab values.

## 2018-07-22 MED FILL — INVOKANA 100 MG TABLET: 100 | 30 days supply | Qty: 30 | Fill #5

## 2018-07-22 MED FILL — ?CLOPIDOGREL 75MG TA: 75 | 30 days supply | Qty: 30 | Fill #0

## 2018-07-22 MED FILL — ?GLIMEPIRIDE 4 MG TABLET: 4 | 30 days supply | Qty: 60 | Fill #6

## 2018-07-22 MED FILL — ?PANTOPRAZOLE SO DR 40MG TA: 40 | 30 days supply | Qty: 30 | Fill #0

## 2018-07-23 ENCOUNTER — Encounter: Payer: Self-pay | Admitting: Physician Assistant

## 2018-07-23 ENCOUNTER — Ambulatory Visit (INDEPENDENT_AMBULATORY_CARE_PROVIDER_SITE_OTHER): Payer: Self-pay | Admitting: Physician Assistant

## 2018-07-23 VITALS — BP 120/78 | HR 73 | Ht 62.0 in | Wt 210.0 lb

## 2018-07-23 DIAGNOSIS — E119 Type 2 diabetes mellitus without complications: Secondary | ICD-10-CM

## 2018-07-23 DIAGNOSIS — I10 Essential (primary) hypertension: Secondary | ICD-10-CM

## 2018-07-23 DIAGNOSIS — I2510 Atherosclerotic heart disease of native coronary artery without angina pectoris: Secondary | ICD-10-CM

## 2018-07-23 DIAGNOSIS — I5032 Chronic diastolic (congestive) heart failure: Secondary | ICD-10-CM

## 2018-07-23 DIAGNOSIS — E875 Hyperkalemia: Secondary | ICD-10-CM

## 2018-07-23 DIAGNOSIS — E785 Hyperlipidemia, unspecified: Secondary | ICD-10-CM

## 2018-07-23 MED FILL — ?METOPROLOL 100 MG TABLET: 100 | 30 days supply | Qty: 60 | Fill #0

## 2018-07-23 NOTE — Progress Notes (Signed)
Cardiology Office Note    Date:  07/25/2018   ID:  [name], DOB 12-30-1955, MRN [id number]  PCP:  [name], MD  Cardiologist:  Dr. Sallyanne Kuster  Chief Complaint  Patient presents with  . Follow-up    seen for Dr, Sallyanne Kuster    History of Present Illness:  [name] is a 62 y.o. female with PMH of CAD s/p remote PCI to RCA and LAD, chronic diastolic heart failure, DM 2, HTN, HLD and obesity.  She had a left circumflex stent placed in 2006.  In June 2016, she underwent PCI to RCA.  She did well for about a year, however due to financial issues she abruptly stopped all of her medications.  She presented in September 2016 with NSTEMI.  Cardiac catheterization revealed two-vessel disease with LAD and RCA treated with DES.  She went for relook cardiac catheterization in October 2016 which revealed widely patent LAD and RCA stents.  More recently, patient was admitted in August 2019 with recurrent chest pain.  Troponin was minimally elevated however remained flat.  She also had mild hyperkalemia with potassium of 5.3 on arrival.  Given her prior cardiac history, she eventually underwent cardiac catheterization which showed occlusion of the distal left circumflex artery and occlusion/subtotally occluded diagonal vessel, medical therapy was recommended due to small caliber of the vessel.  Her Imdur was increased to 60 mg daily.  She was also placed on Plavix during this admission as well.  She had the right leg edema during the admission, venous Doppler was negative for DVT.  She was also given Kayexalate for hyperkalemia.  Patient presents today for cardiology office visit.  She denies any chest pain or shortness of breath.  She has no lower extremity edema, orthopnea or PND.  Right radial cath site appears to be stable without significant bulging or bruit on exam.  She has not had any chest discomfort since the recent discharge.  I will continue her on the current medication.  She will need  basic metabolic panel in 1 week to reassess potassium level given the recent hyperkalemia.  If potassium is high, we may have to reduce her lisinopril.  Otherwise she can follow-up with Dr. Sallyanne Kuster in 2 to 35-month.  Past Medical History:  Diagnosis Date  . Anginal pain ([place])   . Coronary atherosclerosis of native coronary artery, with stent to LCX in 2006 06/04/2013  . Diabetes mellitus without complication ([place])   . GERD (gastroesophageal reflux disease)   . Heart attack ([place])   . Hyperlipidemia LDL goal < 70 06/07/2013  . Hypertension   . S/P coronary artery stent placement to RCA with PROMUS DES, 06/07/13, residual disease on OM1, OM2 and rPDA 06/07/2013    Past Surgical History:  Procedure Laterality Date  . CARDIAC CATHETERIZATION N/A 08/22/2015   Procedure: Left Heart Cath and Coronary Angiography;  Surgeon: Leonie Man, MD;  Location: [facility];  Service: Cardiovascular;  Laterality: N/A;  . CARDIAC CATHETERIZATION N/A 08/22/2015   Procedure: Coronary Stent Intervention;  Surgeon: Leonie Man, MD;  Location: [facility];  Service: Cardiovascular;  Laterality: N/A;  . CARDIAC CATHETERIZATION N/A 08/22/2015   Procedure: Left Heart Cath and Coronary Angiography;  Surgeon: Leonie Man, MD;  Location: [facility];  Service: Cardiovascular;  Laterality: N/A;  . CORONARY STENT PLACEMENT  2006   TAXUS stent CFX in [facility]  . LEFT HEART CATH AND CORONARY ANGIOGRAPHY N/A 07/21/2018  Procedure: LEFT HEART CATH AND CORONARY ANGIOGRAPHY;  Surgeon: Troy Sine, MD;  Location: [facility];  Service: Cardiovascular;  Laterality: N/A;  . LEFT HEART CATHETERIZATION WITH CORONARY ANGIOGRAM N/A 06/07/2013   Procedure: LEFT HEART CATHETERIZATION WITH CORONARY ANGIOGRAM;  Surgeon: Sanda Klein, MD;  Location: [facility];  Service: Cardiovascular;  Laterality: N/A;    Current Medications: Outpatient Medications Prior to Visit  Medication Sig Dispense Refill  .  acetaminophen (TYLENOL) 500 MG tablet Take 1,000 mg by mouth daily as needed for moderate pain.     Marland Kitchen aspirin EC 81 MG EC tablet Take 1 tablet (81 mg total) by mouth daily.    Marland Kitchen atorvastatin (LIPITOR) 80 MG tablet TAKE 1 TABLET BY MOUTH DAILY AT 6 PM 30 tablet 0  . canagliflozin (INVOKANA) 100 MG TABS tablet Take 1 tablet (100 mg total) by mouth daily. 30 tablet 0  . clopidogrel (PLAVIX) 75 MG tablet Take 1 tablet (75 mg total) by mouth daily with breakfast. 30 tablet 0  . furosemide (LASIX) 20 MG tablet Take 2 tablets (40 mg total) by mouth daily. 60 tablet 0  . glimepiride (AMARYL) 4 MG tablet TAKE 1 TABLET BY MOUTH 2 TIMES DAILY WITH A MEAL. 60 tablet 0  . isosorbide mononitrate (IMDUR) 30 MG 24 hr tablet Take 2 tablets (60 mg total) by mouth daily. 60 tablet 0  . lisinopril (PRINIVIL,ZESTRIL) 40 MG tablet Take 1 tablet (40 mg total) by mouth daily. 30 tablet 0  . metFORMIN (GLUCOPHAGE) 500 MG tablet Take 2 tablets (1,000 mg total) by mouth 2 (two) times daily with a meal. Hold for next 2 days, then resume on 07/24/2018 as you received IV contrast with your cardiac cath 120 tablet 0  . metoprolol tartrate (LOPRESSOR) 100 MG tablet Take 1 tablet (100 mg total) by mouth 2 (two) times daily. 60 tablet 6  . nitroGLYCERIN (NITROSTAT) 0.4 MG SL tablet Place 1 tablet (0.4 mg total) under the tongue every 5 (five) minutes as needed for chest pain. 25 tablet 2  . pantoprazole (PROTONIX) 40 MG tablet Take 1 tablet (40 mg total) by mouth daily. 30 tablet 0  . ranitidine (ZANTAC) 150 MG capsule Take 150 mg by mouth daily.      No facility-administered medications prior to visit.      Allergies:   Patient has no known allergies.   Social History   Socioeconomic History  . Marital status: Divorced    Spouse name: Not on file  . Number of children: 1  . Years of education: Not on file  . Highest education level: Not on file  Occupational History  . Occupation: Investment banker, operational  . Financial  resource strain: Not on file  . Food insecurity:    Worry: Not on file    Inability: Not on file  . Transportation needs:    Medical: Not on file    Non-medical: Not on file  Tobacco Use  . Smoking status: Former Smoker    Years: 25.00    Last attempt to quit: 07/03/2004    Years since quitting: 14.0  . Smokeless tobacco: Never Used  Substance and Sexual Activity  . Alcohol use: Yes    Comment: occ  . Drug use: No  . Sexual activity: Not on file  Lifestyle  . Physical activity:    Days per week: Not on file    Minutes per session: Not on file  . Stress: Not on file  Relationships  .  Social connections:    Talks on phone: Not on file    Gets together: Not on file    Attends religious service: Not on file    Active member of club or organization: Not on file    Attends meetings of clubs or organizations: Not on file    Relationship status: Not on file  Other Topics Concern  . Not on file  Social History Narrative  . Not on file     Family History:  The patient's family history includes Diabetes in her brother, mother, and sister; Hypertension in her brother and mother.   ROS:   Please see the history of present illness.    ROS All other systems reviewed and are negative.   PHYSICAL EXAM:   VS:  BP 120/78   Pulse 73   Ht 5\' 2"  (1.575 m)   Wt 210 lb (95.3 kg)   HC 62" (157.5 cm)   SpO2 99%   BMI 38.41 kg/m    GEN: Well nourished, well developed, in no acute distress  HEENT: normal  Neck: no JVD, carotid bruits, or masses Cardiac: RRR; no murmurs, rubs, or gallops,no edema  Respiratory:  clear to auscultation bilaterally, normal work of breathing GI: soft, nontender, nondistended, + BS MS: no deformity or atrophy  Skin: warm and dry, no rash Neuro:  Alert and Oriented x 3, Strength and sensation are intact Psych: euthymic mood, full affect  Wt Readings from Last 3 Encounters:  07/23/18 210 lb (95.3 kg)  07/21/18 210 lb 12.8 oz (95.6 kg)  02/17/18 196 lb  12.8 oz (89.3 kg)      Studies/Labs Reviewed:   EKG:  EKG is not ordered today.    Recent Labs: 02/17/2018: ALT 35 07/21/2018: BUN 30; Creatinine, Ser 0.93; Hemoglobin 14.1; Platelets 229; Potassium 4.6; Sodium 139   Lipid Panel    Component Value Date/Time   CHOL 129 02/17/2018 1017   TRIG 103 02/17/2018 1017   HDL 42 02/17/2018 1017   CHOLHDL 3.1 02/17/2018 1017   CHOLHDL 2.4 02/11/2017 1002   VLDL 18 02/11/2017 1002   LDLCALC 66 02/17/2018 1017    Additional studies/ records that were reviewed today include:   Cath 07/21/2018  Ost RCA to Mid RCA lesion is 15% stenosed.  Ost RPDA to RPDA lesion is 20% stenosed.  Ost LAD to Prox LAD lesion is 20% stenosed.  Dist LAD lesion is 70% stenosed with 65% stenosed side branch in Ost 3rd Sept.  Mid Cx to Dist Cx lesion is 10% stenosed.  Dist Cx lesion is 100% stenosed.  1st Mrg lesion is 80% stenosed.  2nd Mrg lesion is 50% stenosed.   Coronary obstructive disease with a widely patent stent in the proximal LAD mild irregularity of the LAD with improved distal LAD stenosis now  65 to 70%; circumflex vessel with small first marginal branch with 80% stenosis, 50% stenosis and a small proximal second marginal branch, 20% mid circumflex narrowing with patent proximal to midportion of the distal circumflex stent with total occlusion at the distal aspect of stent.  There is faint collateralization to the distal circumflex via RCA; and patent proximal to mid RCA stent with mild intimal hyperplasia, and mild irregularity of the RCA with 20% PDA stenosis.  LVEDP 19 mm.  RECOMMENDATION: Review of the old films indicates that the very distal circumflex which is now occluded was a very small caliber diminutive vessel.  As result medical therapy will be recommended.  The patient will  require more optimal blood pressure control.  Recommend increase beta-blocker therapy.  Recommend addition of Plavix  Recommend uninterrupted dual antiplatelet  therapy with Aspirin 81mg  daily and Clopidogrel 75mg  daily for a minimum of one month and longer if tolerated with multiple stents and recent occlusion.    ASSESSMENT:    1. Coronary artery disease involving native coronary artery of native heart without angina pectoris   2. Hyperkalemia   3. Chronic diastolic heart failure ([place])   4. Controlled type 2 diabetes mellitus without complication, without long-term current use of insulin ([place])   5. Essential hypertension   6. Hyperlipidemia, unspecified hyperlipidemia type      PLAN:  In order of problems listed above:  1. CAD: Recently underwent cardiac catheterization, this revealed occluded distal left circumflex, medical therapy was recommended.  Continue aspirin and Plavix.  2. Hyperkalemia: Repeat basic metabolic panel, if still high may need will need to reduce lisinopril  3. Hypertension: Blood pressure well controlled  4. Hyperlipidemia: Continue 80 mg daily.  Well-controlled cholesterol on the last lab work on 02/17/2018.  5. DM2: Managed by primary care provider. 6.   Medication Adjustments/Labs and Tests Ordered: Current medicines are reviewed at length with the patient today.  Concerns regarding medicines are outlined above.  Medication changes, Labs and Tests ordered today are listed in the Patient Instructions below. Patient Instructions  Medication Instructions:   Your physician recommends that you continue on your current medications as directed. Please refer to the Current Medication list given to you today.  Labwork:  Your physician recommends that you return for lab work in: one week to check your BMET.  Testing/Procedures:  NONE  Follow-Up:  Your physician recommends that you schedule a follow-up appointment in: 2-3 months with Dr. Sallyanne Kuster.  Any Other Special Instructions Will Be Listed Below (If Applicable).  If you need a refill on your cardiac medications before your next appointment, please call  your pharmacy.    Hilbert Corrigan, [place]  07/25/2018 11:28 PM    [address], [place], [place]  [address] Phone: [phone]; Fax: [phone]

## 2018-07-23 NOTE — Patient Instructions (Addendum)
Medication Instructions:   Your physician recommends that you continue on your current medications as directed. Please refer to the Current Medication list given to you today.  Labwork:  Your physician recommends that you return for lab work in: one week to check your BMET.  Testing/Procedures:  NONE  Follow-Up:  Your physician recommends that you schedule a follow-up appointment in: 2-3 months with Dr. Sallyanne Kuster.  Any Other Special Instructions Will Be Listed Below (If Applicable).  If you need a refill on your cardiac medications before your next appointment, please call your pharmacy.

## 2018-07-25 ENCOUNTER — Encounter: Payer: Self-pay | Admitting: Physician Assistant

## 2018-07-30 ENCOUNTER — Ambulatory Visit (HOSPITAL_BASED_OUTPATIENT_CLINIC_OR_DEPARTMENT_OTHER): Payer: Self-pay | Admitting: Pharmacist

## 2018-07-30 ENCOUNTER — Encounter: Payer: Self-pay | Admitting: Pharmacist

## 2018-07-30 ENCOUNTER — Ambulatory Visit: Payer: Self-pay | Attending: Family Medicine | Admitting: Family Medicine

## 2018-07-30 ENCOUNTER — Encounter: Payer: Self-pay | Admitting: Family Medicine

## 2018-07-30 VITALS — BP 109/71 | HR 65 | Temp 98.2°F | Ht 62.0 in | Wt 214.0 lb

## 2018-07-30 DIAGNOSIS — Z7902 Long term (current) use of antithrombotics/antiplatelets: Secondary | ICD-10-CM | POA: Insufficient documentation

## 2018-07-30 DIAGNOSIS — I25119 Atherosclerotic heart disease of native coronary artery with unspecified angina pectoris: Secondary | ICD-10-CM | POA: Insufficient documentation

## 2018-07-30 DIAGNOSIS — X58XXXA Exposure to other specified factors, initial encounter: Secondary | ICD-10-CM | POA: Insufficient documentation

## 2018-07-30 DIAGNOSIS — I5032 Chronic diastolic (congestive) heart failure: Secondary | ICD-10-CM | POA: Insufficient documentation

## 2018-07-30 DIAGNOSIS — Z7984 Long term (current) use of oral hypoglycemic drugs: Secondary | ICD-10-CM | POA: Insufficient documentation

## 2018-07-30 DIAGNOSIS — K219 Gastro-esophageal reflux disease without esophagitis: Secondary | ICD-10-CM | POA: Insufficient documentation

## 2018-07-30 DIAGNOSIS — I252 Old myocardial infarction: Secondary | ICD-10-CM | POA: Insufficient documentation

## 2018-07-30 DIAGNOSIS — I10 Essential (primary) hypertension: Secondary | ICD-10-CM

## 2018-07-30 DIAGNOSIS — Z955 Presence of coronary angioplasty implant and graft: Secondary | ICD-10-CM | POA: Insufficient documentation

## 2018-07-30 DIAGNOSIS — E785 Hyperlipidemia, unspecified: Secondary | ICD-10-CM | POA: Insufficient documentation

## 2018-07-30 DIAGNOSIS — S40021A Contusion of right upper arm, initial encounter: Secondary | ICD-10-CM | POA: Insufficient documentation

## 2018-07-30 DIAGNOSIS — E1169 Type 2 diabetes mellitus with other specified complication: Secondary | ICD-10-CM

## 2018-07-30 DIAGNOSIS — Z7982 Long term (current) use of aspirin: Secondary | ICD-10-CM | POA: Insufficient documentation

## 2018-07-30 DIAGNOSIS — Z79899 Other long term (current) drug therapy: Secondary | ICD-10-CM | POA: Insufficient documentation

## 2018-07-30 DIAGNOSIS — I110 Hypertensive heart disease with heart failure: Secondary | ICD-10-CM | POA: Insufficient documentation

## 2018-07-30 LAB — GLUCOSE, POCT (MANUAL RESULT ENTRY): POC Glucose: 164 mg/dl — AB (ref 70–99)

## 2018-07-30 MED ORDER — ISOSORBIDE MONONITRATE ER 30 MG PO TB24: 60.0000 mg | ORAL_TABLET | Freq: Every day | ORAL | 6 refills | Status: DC

## 2018-07-30 MED ORDER — LIRAGLUTIDE 18 MG/3ML ~~LOC~~ SOPN: 1.8000 mg | PEN_INJECTOR | Freq: Every day | SUBCUTANEOUS | 6 refills | Status: DC

## 2018-07-30 MED ORDER — FUROSEMIDE 20 MG PO TABS: 40.0000 mg | ORAL_TABLET | Freq: Every day | ORAL | 6 refills | Status: DC

## 2018-07-30 MED ORDER — ATORVASTATIN CALCIUM 80 MG PO TABS: ORAL_TABLET | ORAL | 6 refills | Status: DC

## 2018-07-30 MED ORDER — METFORMIN HCL 500 MG PO TABS: 1000.0000 mg | ORAL_TABLET | Freq: Two times a day (BID) | ORAL | 6 refills | Status: DC

## 2018-07-30 MED ORDER — CLOPIDOGREL BISULFATE 75 MG PO TABS: 75.0000 mg | ORAL_TABLET | Freq: Every day | ORAL | 6 refills | Status: DC

## 2018-07-30 MED ORDER — METOPROLOL TARTRATE 100 MG PO TABS: 100.0000 mg | ORAL_TABLET | Freq: Two times a day (BID) | ORAL | 6 refills | Status: DC

## 2018-07-30 MED ORDER — PANTOPRAZOLE SODIUM 40 MG PO TBEC: 40.0000 mg | DELAYED_RELEASE_TABLET | Freq: Every day | ORAL | 6 refills | Status: DC

## 2018-07-30 MED ORDER — GLIMEPIRIDE 4 MG PO TABS: ORAL_TABLET | ORAL | 6 refills | Status: DC

## 2018-07-30 MED ORDER — LISINOPRIL 40 MG PO TABS: 40.0000 mg | ORAL_TABLET | Freq: Every day | ORAL | 6 refills | Status: DC

## 2018-07-30 MED FILL — ?METFORMIN HCL 500MG TABS: 500 | 30 days supply | Qty: 120 | Fill #0

## 2018-07-30 MED FILL — ISOSORBIDE MN ER 30 MG TAB: 30 | 30 days supply | Qty: 60 | Fill #0

## 2018-07-30 MED FILL — LISINOPRIL 40 MG TABLET: 40 | 30 days supply | Qty: 30 | Fill #0

## 2018-07-30 MED FILL — ?FUROSEMIDE 20 MG TABLET: 20 | 30 days supply | Qty: 60 | Fill #0

## 2018-07-30 MED FILL — **VICTOZA 18 MG/3 ML INJECT: 18 | 17 days supply | Qty: 3 | Fill #0

## 2018-07-30 MED FILL — ATORVASTATIN 80 MG TABLET: 80 | 30 days supply | Qty: 30 | Fill #0

## 2018-07-30 MED FILL — PANTOPRAZOLE SOD DR 40 MG T: 40 | 30 days supply | Qty: 30 | Fill #0

## 2018-07-30 NOTE — Progress Notes (Signed)
Subjective:  Patient ID: [name], female    DOB: 30-Jan-1956  Age: 62 y.o. MRN: [id number]  CC: Diabetes   HPI [name] is a 62 year old female with a history of type 2 diabetes mellitus (A1c 8.1), dyslipidemia, coronary artery disease status post DES stent (in 01/2978), diastolic heart failure (EF 65-70%, grade 1 DD from echo of 08/2015), essential hypertension recently hospitalized for angina. Stress test and high risk with evidence  Of  inducible ischemia. Cardiac cath revealed: Coronary obstructive disease with a widely patent stent in the proximal LAD mild irregularity of the LAD with improved distal LAD stenosis now  65 to 70%; circumflex vessel with small first marginal branch with 80% stenosis, 50% stenosis and a small proximal second marginal branch, 20% mid circumflex narrowing with patent proximal to midportion of the distal circumflex stent with total occlusion at the distal aspect of stent.  There is faint collateralization to the distal circumflex via RCA; and patent proximal to mid RCA stent with mild intimal hyperplasia, and mild irregularity of the RCA with 20% PDA stenosis.  LVEDP 19 mm.  RECOMMENDATION: Review of the old films indicates that the very distal circumflex which is now occluded was a very small caliber diminutive vessel.  As result medical therapy will be recommended.  The patient will require more optimal blood pressure control.  Recommend increase beta-blocker therapy.  Recommend addition of Plavix  Recommend uninterrupted dual antiplatelet therapy with Aspirin 81mg  daily and Clopidogrel 75mg  daily for a minimum of one month and longer if tolerated with multiple stents and recent occlusion. Her medications were optimized and she was discharged. She is accompanied by her daughter and has had a hospital follow up with Cardiology, no regimen change  Made at that visit. She complains of a  Bruise on her  Right wrist at the site of her cardiac cath which  has increased in size and is slightly tender. She endorses compliance with all her medications and  Denies chest pains. Her  A1c is  8.1 and was 8.0  Previously.  Past Medical History:  Diagnosis Date  . Anginal pain ([place])   . Coronary atherosclerosis of native coronary artery, with stent to LCX in 2006 06/04/2013  . Diabetes mellitus without complication ([place])   . GERD (gastroesophageal reflux disease)   . Heart attack ([place])   . Hyperlipidemia LDL goal < 70 06/07/2013  . Hypertension   . S/P coronary artery stent placement to RCA with PROMUS DES, 06/07/13, residual disease on OM1, OM2 and rPDA 06/07/2013    Past Surgical History:  Procedure Laterality Date  . CARDIAC CATHETERIZATION N/A 08/22/2015   Procedure: Left Heart Cath and Coronary Angiography;  Surgeon: Leonie Man, MD;  Location: [facility];  Service: Cardiovascular;  Laterality: N/A;  . CARDIAC CATHETERIZATION N/A 08/22/2015   Procedure: Coronary Stent Intervention;  Surgeon: Leonie Man, MD;  Location: [facility];  Service: Cardiovascular;  Laterality: N/A;  . CARDIAC CATHETERIZATION N/A 08/22/2015   Procedure: Left Heart Cath and Coronary Angiography;  Surgeon: Leonie Man, MD;  Location: [facility];  Service: Cardiovascular;  Laterality: N/A;  . CORONARY STENT PLACEMENT  2006   TAXUS stent CFX in [facility]  . LEFT HEART CATH AND CORONARY ANGIOGRAPHY N/A 07/21/2018   Procedure: LEFT HEART CATH AND CORONARY ANGIOGRAPHY;  Surgeon: Troy Sine, MD;  Location: [facility];  Service: Cardiovascular;  Laterality: N/A;  . LEFT HEART CATHETERIZATION  WITH CORONARY ANGIOGRAM N/A 06/07/2013   Procedure: LEFT HEART CATHETERIZATION WITH CORONARY ANGIOGRAM;  Surgeon: Sanda Klein, MD;  Location: [facility];  Service: Cardiovascular;  Laterality: N/A;    No Known Allergies   Outpatient Medications Prior to Visit  Medication Sig Dispense Refill  . acetaminophen (TYLENOL) 500 MG tablet Take 1,000  mg by mouth daily as needed for moderate pain.     Marland Kitchen aspirin EC 81 MG EC tablet Take 1 tablet (81 mg total) by mouth daily.    . nitroGLYCERIN (NITROSTAT) 0.4 MG SL tablet Place 1 tablet (0.4 mg total) under the tongue every 5 (five) minutes as needed for chest pain. 25 tablet 2  . ranitidine (ZANTAC) 150 MG capsule Take 150 mg by mouth daily.     Marland Kitchen atorvastatin (LIPITOR) 80 MG tablet TAKE 1 TABLET BY MOUTH DAILY AT 6 PM 30 tablet 0  . canagliflozin (INVOKANA) 100 MG TABS tablet Take 1 tablet (100 mg total) by mouth daily. 30 tablet 0  . clopidogrel (PLAVIX) 75 MG tablet Take 1 tablet (75 mg total) by mouth daily with breakfast. 30 tablet 0  . furosemide (LASIX) 20 MG tablet Take 2 tablets (40 mg total) by mouth daily. 60 tablet 0  . glimepiride (AMARYL) 4 MG tablet TAKE 1 TABLET BY MOUTH 2 TIMES DAILY WITH A MEAL. 60 tablet 0  . isosorbide mononitrate (IMDUR) 30 MG 24 hr tablet Take 2 tablets (60 mg total) by mouth daily. 60 tablet 0  . lisinopril (PRINIVIL,ZESTRIL) 40 MG tablet Take 1 tablet (40 mg total) by mouth daily. 30 tablet 0  . metFORMIN (GLUCOPHAGE) 500 MG tablet Take 2 tablets (1,000 mg total) by mouth 2 (two) times daily with a meal. Hold for next 2 days, then resume on 07/24/2018 as you received IV contrast with your cardiac cath 120 tablet 0  . metoprolol tartrate (LOPRESSOR) 100 MG tablet Take 1 tablet (100 mg total) by mouth 2 (two) times daily. 60 tablet 6  . pantoprazole (PROTONIX) 40 MG tablet Take 1 tablet (40 mg total) by mouth daily. 30 tablet 0   No facility-administered medications prior to visit.     ROS Review of Systems  Constitutional: Negative for activity change, appetite change and fatigue.  HENT: Negative for congestion, sinus pressure and sore throat.   Eyes: Negative for visual disturbance.  Respiratory: Negative for cough, chest tightness, shortness of breath and wheezing.   Cardiovascular: Negative for chest pain and palpitations.  Gastrointestinal:  Negative for abdominal distention, abdominal pain and constipation.  Endocrine: Negative for polydipsia.  Genitourinary: Negative for dysuria and frequency.  Musculoskeletal: Negative for arthralgias and back pain.  Skin:       See hpi  Neurological: Negative for tremors, light-headedness and numbness.  Hematological: Does not bruise/bleed easily.  Psychiatric/Behavioral: Negative for agitation and behavioral problems.    Objective:  BP 109/71   Pulse 65   Temp 98.2 F (36.8 C) (Oral)   Ht 5\' 2"  (1.575 m)   Wt 214 lb (97.1 kg)   SpO2 93%   BMI 39.14 kg/m   BP/Weight 07/30/2018 07/23/5026 06/18/1286  Systolic BP 867 672 094  Diastolic BP 71 78 62  Wt. (Lbs) 214 210 210.8  BMI 39.14 38.41 38.56      Physical Exam  Constitutional: She is oriented to person, place, and time. She appears well-developed and well-nourished.  Cardiovascular: Normal rate, normal heart sounds and intact distal pulses.  No murmur heard. Pulmonary/Chest: Effort normal and breath  sounds normal. She has no wheezes. She has no rales. She exhibits no tenderness.  Abdominal: Soft. Bowel sounds are normal. She exhibits no distension and no mass. There is no tenderness.  Musculoskeletal: Normal range of motion.  Neurological: She is alert and oriented to person, place, and time.  Skin:  R wrist bruise with slight tenderness, no  hematoma  Psychiatric: She has a normal mood and affect.    Lab Results  Component Value Date   HGBA1C 8.1 (H) 07/19/2018    Assessment & Plan:   1. Type 2 diabetes mellitus with other specified complication, without long-term current use of insulin (HCC) Uncontrolled with A1c of 8.1 which is above goal of <7.0 Victoza added to regimen Discontinue Invokana due to side effects involving risk of amputation Clinical Pharmacist called in  For education; review blood sugars at CPP visit and  If blood sugars remain above goal, will consider adding Jardiance - POCT glucose (manual  entry) - metFORMIN (GLUCOPHAGE) 500 MG tablet; Take 2 tablets (1,000 mg total) by mouth 2 (two) times daily with a meal.  Dispense: 120 tablet; Refill: 6 - glimepiride (AMARYL) 4 MG tablet; TAKE 1 TABLET BY MOUTH 2 TIMES DAILY WITH A MEAL.  Dispense: 60 tablet; Refill: 6  2. Essential hypertension Controlled Counseled on blood pressure goal of less than 130/80, low-sodium, DASH diet, medication compliance, 150 minutes of moderate intensity exercise per week. Discussed medication compliance, adverse effects. - metoprolol tartrate (LOPRESSOR) 100 MG tablet; Take 1 tablet (100 mg total) by mouth 2 (two) times daily.  Dispense: 60 tablet; Refill: 6 - lisinopril (PRINIVIL,ZESTRIL) 40 MG tablet; Take 1 tablet (40 mg total) by mouth daily.  Dispense: 30 tablet; Refill: 6  3. Chronic diastolic heart failure (HCC) Euvolemic - isosorbide mononitrate (IMDUR) 30 MG 24 hr tablet; Take 2 tablets (60 mg total) by mouth daily.  Dispense: 60 tablet; Refill: 6 - furosemide (LASIX) 20 MG tablet; Take 2 tablets (40 mg total) by mouth daily.  Dispense: 60 tablet; Refill: 6  4. Dyslipidemia Controlled Low cholesterol  diet - atorvastatin (LIPITOR) 80 MG tablet; TAKE 1 TABLET BY MOUTH DAILY AT 6 PM  Dispense: 30 tablet; Refill: 6  5. Contusion of right upper arm, initial encounter At site of cardiac cath Advised to apply ice Will check cbc; if bruise increases in size , contact Cardiology - CBC with Differential/Platelet  6. Coronary artery disease involving native coronary artery of native heart with angina pectoris [facility]) S/p  Stent ASA and Plavix Follow up with Cardiology Risk factor modification   Meds ordered this encounter  Medications  . liraglutide (VICTOZA) 18 MG/3ML SOPN    Sig: Inject 0.3 mLs (1.8 mg total) into the skin daily with breakfast. Start with 0.1 mL daily for 1 week then 0.2 mL for 1 week then 0.3 mL    Dispense:  3 pen    Refill:  6  . pantoprazole (PROTONIX) 40 MG tablet     Sig: Take 1 tablet (40 mg total) by mouth daily.    Dispense:  30 tablet    Refill:  6  . metoprolol tartrate (LOPRESSOR) 100 MG tablet    Sig: Take 1 tablet (100 mg total) by mouth 2 (two) times daily.    Dispense:  60 tablet    Refill:  6  . metFORMIN (GLUCOPHAGE) 500 MG tablet    Sig: Take 2 tablets (1,000 mg total) by mouth 2 (two) times daily with a meal.    Dispense:  120 tablet    Refill:  6  . lisinopril (PRINIVIL,ZESTRIL) 40 MG tablet    Sig: Take 1 tablet (40 mg total) by mouth daily.    Dispense:  30 tablet    Refill:  6  . isosorbide mononitrate (IMDUR) 30 MG 24 hr tablet    Sig: Take 2 tablets (60 mg total) by mouth daily.    Dispense:  60 tablet    Refill:  6  . glimepiride (AMARYL) 4 MG tablet    Sig: TAKE 1 TABLET BY MOUTH 2 TIMES DAILY WITH A MEAL.    Dispense:  60 tablet    Refill:  6  . furosemide (LASIX) 20 MG tablet    Sig: Take 2 tablets (40 mg total) by mouth daily.    Dispense:  60 tablet    Refill:  6  . clopidogrel (PLAVIX) 75 MG tablet    Sig: Take 1 tablet (75 mg total) by mouth daily with breakfast.    Dispense:  30 tablet    Refill:  6  . atorvastatin (LIPITOR) 80 MG tablet    Sig: TAKE 1 TABLET BY MOUTH DAILY AT 6 PM    Dispense:  30 tablet    Refill:  6    Follow-up: Return in about 3 months (around 10/30/2018) for Follow-up of chronic medical conditions.   [name] MD

## 2018-07-30 NOTE — Progress Notes (Signed)
Patient was educated on the use of Victoza. Reviewed necessary supplies and operation of the pen. Also reviewed goal blood glucose levels. Patient was able to demonstrate use. All questions and concerns were addressed.  She will see me again for further titration of Victoza.

## 2018-07-30 NOTE — Patient Instructions (Signed)
Liraglutide injection What is this medicine? LIRAGLUTIDE (LIR a GLOO tide) is used to improve blood sugar control in adults with type 2 diabetes. This medicine may be used with other diabetes medicines. This drug may also reduce the risk of heart attack or stroke if you have type 2 diabetes and risk factors for heart disease. This medicine may be used for other purposes; ask your health care provider or pharmacist if you have questions. COMMON BRAND NAME(S): Victoza What should I tell my health care provider before I take this medicine? They need to know if you have any of these conditions: -endocrine tumors (MEN 2) or if someone in your family had these tumors -gallbladder disease -high cholesterol -history of alcohol abuse problem -history of pancreatitis -kidney disease or if you are on dialysis -liver disease -previous swelling of the tongue, face, or lips with difficulty breathing, difficulty swallowing, hoarseness, or tightening of the throat -stomach problems -thyroid cancer or if someone in your family had thyroid cancer -an unusual or allergic reaction to liraglutide, other medicines, foods, dyes, or preservatives -pregnant or trying to get pregnant -breast-feeding How should I use this medicine? This medicine is for injection under the skin of your upper leg, stomach area, or upper arm. You will be taught how to prepare and give this medicine. Use exactly as directed. Take your medicine at regular intervals. Do not take it more often than directed. It is important that you put your used needles and syringes in a special sharps container. Do not put them in a trash can. If you do not have a sharps container, call your pharmacist or healthcare provider to get one. A special MedGuide will be given to you by the pharmacist with each prescription and refill. Be sure to read this information carefully each time. Talk to your pediatrician regarding the use of this medicine in children.  Special care may be needed. Overdosage: If you think you have taken too much of this medicine contact a poison control center or emergency room at once. NOTE: This medicine is only for you. Do not share this medicine with others. What if I miss a dose? If you miss a dose, take it as soon as you can. If it is almost time for your next dose, take only that dose. Do not take double or extra doses. What may interact with this medicine? -other medicines for diabetes Many medications may cause changes in blood sugar, these include: -alcohol containing beverages -antiviral medicines for HIV or AIDS -aspirin and aspirin-like drugs -certain medicines for blood pressure, heart disease, irregular heart beat -chromium -diuretics -female hormones, such as estrogens or progestins, birth control pills -fenofibrate -gemfibrozil -isoniazid -lanreotide -female hormones or anabolic steroids -MAOIs like Carbex, Eldepryl, Marplan, Nardil, and Parnate -medicines for weight loss -medicines for allergies, asthma, cold, or cough -medicines for depression, anxiety, or psychotic disturbances -niacin -nicotine -NSAIDs, medicines for pain and inflammation, like ibuprofen or naproxen -octreotide -pasireotide -pentamidine -phenytoin -probenecid -quinolone antibiotics such as ciprofloxacin, levofloxacin, ofloxacin -some herbal dietary supplements -steroid medicines such as prednisone or cortisone -sulfamethoxazole; trimethoprim -thyroid hormones Some medications can hide the warning symptoms of low blood sugar (hypoglycemia). You may need to monitor your blood sugar more closely if you are taking one of these medications. These include: -beta-blockers, often used for high blood pressure or heart problems (examples include atenolol, metoprolol, propranolol) -clonidine -guanethidine -reserpine This list may not describe all possible interactions. Give your health care provider a list of all the medicines,  herbs, non-prescription drugs, or dietary supplements you use. Also tell them if you smoke, drink alcohol, or use illegal drugs. Some items may interact with your medicine. What should I watch for while using this medicine? Visit your doctor or health care professional for regular checks on your progress. Drink plenty of fluids while taking this medicine. Check with your doctor or health care professional if you get an attack of severe diarrhea, nausea, and vomiting. The loss of too much body fluid can make it dangerous for you to take this medicine. A test called the HbA1C (A1C) will be monitored. This is a simple blood test. It measures your blood sugar control over the last 2 to 3 months. You will receive this test every 3 to 6 months. Learn how to check your blood sugar. Learn the symptoms of low and high blood sugar and how to manage them. Always carry a quick-source of sugar with you in case you have symptoms of low blood sugar. Examples include hard sugar candy or glucose tablets. Make sure others know that you can choke if you eat or drink when you develop serious symptoms of low blood sugar, such as seizures or unconsciousness. They must get medical help at once. Tell your doctor or health care professional if you have high blood sugar. You might need to change the dose of your medicine. If you are sick or exercising more than usual, you might need to change the dose of your medicine. Do not skip meals. Ask your doctor or health care professional if you should avoid alcohol. Many nonprescription cough and cold products contain sugar or alcohol. These can affect blood sugar. Pens should never be shared. Even if the needle is changed, sharing may result in passing of viruses like hepatitis or HIV. Wear a medical ID bracelet or chain, and carry a card that describes your disease and details of your medicine and dosage times. What side effects may I notice from receiving this medicine? Side effects  that you should report to your doctor or health care professional as soon as possible: -allergic reactions like skin rash, itching or hives, swelling of the face, lips, or tongue -breathing problems -diarrhea that continues or is severe -lump or swelling on the neck -severe nausea -signs and symptoms of infection like fever or chills; cough; sore throat; pain or trouble passing urine -signs and symptoms of low blood sugar such as feeling anxious, confusion, dizziness, increased hunger, unusually weak or tired, sweating, shakiness, cold, irritable, headache, blurred vision, fast heartbeat, loss of consciousness -signs and symptoms of kidney injury like trouble passing urine or change in the amount of urine -trouble swallowing -unusual stomach upset or pain -vomiting Side effects that usually do not require medical attention (report to your doctor or health care professional if they continue or are bothersome): -constipation -decreased appetite -diarrhea -fatigue -headache -nausea -pain, redness, or irritation at site where injected -stomach upset -stuffy or runny nose This list may not describe all possible side effects. Call your doctor for medical advice about side effects. You may report side effects to FDA at 1-800-FDA-1088. Where should I keep my medicine? Keep out of the reach of children. Store unopened pen in a refrigerator between 2 and 8 degrees C (36 and 46 degrees F). Do not freeze or use if the medicine has been frozen. Protect from light and excessive heat. After you first use the pen, it can be stored at room temperature between 15 and 30 degrees C (59 and  86 degrees F) or in a refrigerator. Throw away your used pen after 30 days or after the expiration date, whichever comes first. Do not store your pen with the needle attached. If the needle is left on, medicine may leak from the pen. NOTE: This sheet is a summary. It may not cover all possible information. If you have  questions about this medicine, talk to your doctor, pharmacist, or health care provider.  2018 Elsevier/Gold Standard (2016-12-19 14:39:40)

## 2018-07-30 NOTE — Progress Notes (Signed)
Thanks, K was [employer]

## 2018-07-31 ENCOUNTER — Encounter: Payer: Self-pay | Admitting: Family Medicine

## 2018-08-03 LAB — BASIC METABOLIC PANEL
BUN/Creatinine Ratio: 17 (ref 9–23)
BUN: 13 mg/dL (ref 6–24)
CALCIUM: 9.6 mg/dL (ref 8.7–10.2)
CHLORIDE: 103 mmol/L (ref 96–106)
CO2: 22 mmol/L (ref 20–29)
Creatinine, Ser: 0.77 mg/dL (ref 0.57–1.00)
GFR calc Af Amer: 98 mL/min/{1.73_m2} (ref >59)
GFR calc non Af Amer: 85 mL/min/{1.73_m2} (ref >59)
GLUCOSE: 168 mg/dL — AB (ref 65–99)
POTASSIUM: 4 mmol/L (ref 3.5–5.2)
SODIUM: 142 mmol/L (ref 134–144)

## 2018-08-14 ENCOUNTER — Ambulatory Visit: Payer: Self-pay

## 2018-08-14 ENCOUNTER — Ambulatory Visit: Payer: Self-pay | Admitting: Pharmacist

## 2018-08-18 MED FILL — !VICTOZA 18MG/3ML INJECT: 18 | 28 days supply | Qty: 9 | Fill #1

## 2018-08-20 ENCOUNTER — Ambulatory Visit: Payer: Self-pay | Admitting: Pharmacist

## 2018-08-20 ENCOUNTER — Ambulatory Visit: Payer: Self-pay

## 2018-08-20 ENCOUNTER — Other Ambulatory Visit: Payer: Self-pay

## 2018-08-20 MED ORDER — INSULIN PEN NEEDLE 32G X 4 MM MISC: 3 refills | Status: DC

## 2018-08-21 MED FILL — ?CLOPIDOGREL 75MG TA: 75 | 30 days supply | Qty: 30 | Fill #0

## 2018-08-21 MED FILL — ?GLIMEPIRIDE 4 MG TABLET: 4 | 30 days supply | Qty: 60 | Fill #0

## 2018-08-21 MED FILL — ?METOPROLOL 100 MG TABLET: 100 | 30 days supply | Qty: 60 | Fill #1

## 2018-08-31 ENCOUNTER — Ambulatory Visit: Payer: Self-pay | Attending: Family Medicine

## 2018-09-03 ENCOUNTER — Encounter: Payer: Self-pay | Admitting: Pharmacist

## 2018-09-03 ENCOUNTER — Ambulatory Visit: Payer: Self-pay | Attending: Family Medicine | Admitting: Pharmacist

## 2018-09-03 DIAGNOSIS — E119 Type 2 diabetes mellitus without complications: Secondary | ICD-10-CM | POA: Insufficient documentation

## 2018-09-03 DIAGNOSIS — E1169 Type 2 diabetes mellitus with other specified complication: Secondary | ICD-10-CM

## 2018-09-03 NOTE — Patient Instructions (Signed)
Thank you for coming to see me today. Please do the following:  1. Continue metformin and Victoza.  2. Decrease glimepiride to once daily before largest meal.  3. Continue checking blood sugars at home. If they start to go up, let me know. We may need to add back the glimepiride to 2 times daily.   4. Continue making the lifestyle changes we've discussed together during our visit. Diet and exercise play a significant role in improving your blood sugars.  5. Follow-up with me 2 - 4 weeks.    Hypoglycemia or low blood sugar:   Low blood sugar can happen quickly and may become an emergency if not treated right away.   While this shouldn't happen often, it can be brought upon if you skip a meal or do not eat enough. Also, if your insulin or other diabetes medications are dosed too high, this can cause your blood sugar to go to low.   Warning signs of low blood sugar include: 1. Feeling shaky or dizzy 2. Feeling weak or tired  3. Excessive hunger 4. Feeling anxious or upset  5. Sweating even when you aren't exercising  What to do if I experience low blood sugar? 1. Check your blood sugar with your meter. If lower than 70, proceed to step 2.  2. Treat with 3-4 glucose tablets or 3 packets of regular sugar. If these aren't around, you can try hard candy. Yet another option would be to drink 4 ounces of fruit juice or 6 ounces of REGULAR soda.  3. Re-check your sugar in 15 minutes. If it is still below 70, do what you did in step 2 again. If has come back up, go ahead and eat a snack or small meal at this time.

## 2018-09-03 NOTE — Progress Notes (Signed)
    S:    PCP: Dr. Margarita Rana No chief complaint on file.  Patient arrives in good spirits. Presents for diabetes management at the request of Dr. Margarita Rana. Patient was referred and last seen on 07/30/18. At that visit, Victoza added to regimen. Pt was instructed to follow-up with me. Patient denies hypoglycemic events. No objective home values < 70.  Patient reports adherence with medications.  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 "I drink a lot of water at night" Patient denies neuropathy. Patient denies visual changes.  O:  POCT: 74. Fasting.   Lab Results  Component Value Date   HGBA1C 8.1 (H) 07/19/2018   There were no vitals filed for this visit.  Lipid Panel     Component Value Date/Time   CHOL 129 02/17/2018 1017   TRIG 103 02/17/2018 1017   HDL 42 02/17/2018 1017   CHOLHDL 3.1 02/17/2018 1017   CHOLHDL 2.4 02/11/2017 1002   VLDL 18 02/11/2017 1002   LDLCALC 66 02/17/2018 1017    Home fasting CBG: 74 - 147; 1 outlier 205 (pt reports drinking rootbeer float night before)  2 hour post-prandial/random CBG: 126-188  Clinical ASCVD: Yes   A/P: Diabetes longstanding currently uncontrolled based on A1c of 8.1. Patient was able to eat snack after POCT test. She is able to verbalize appropriate hypoglycemia management plan. Patient is adherent with medication.   Home levels reported to be improving since addition of Victoza. Pt has occasional outliers but many home levels are at  goal. Of note, she has several fasting levels in the 70-80s. Will have her decrease her SU to once daily. She knows to monitor and re-initiate if sugar levels increase.    Pt with clinical ASCVD. She is on GLP-1 RA therapy. Agree with recommendation that Jardiance be added in the future if continued A1c lowering required for additional CV benefit.  -Continued metformin 500mg  2 tablets BID. -Decreased dose of  glimepiride to 4 mg daily.  -Continued Victoza 1.8 mg daily. -Extensively discussed pathophysiology of DM, recommended lifestyle interventions, dietary effects on glycemic control -Counseled on s/sx of and management of hypoglycemia -Next A1C anticipated 11/19.   ASCVD risk - secondary prevention in patient with DM. Last LDL is controlled. high intensity statin indicated. Aspirin is indicated.  -Continued aspirin 81 mg  -Continued aotrvastatin 80 mg.   Written patient instructions provided.  Total time in face to face counseling 15 minutes.   Follow up Pharmacist Clinic Visit 10/05/18.     Patient seen with:  Marylene Buerger, PharmD Candidate [employer] Class of 2021  Benard Halsted, PharmD, [place] [phone]    `

## 2018-09-11 MED FILL — ATORVASTATIN 80 MG TABLET: 80 | 30 days supply | Qty: 30 | Fill #1

## 2018-09-11 MED FILL — ?METFORMIN HCL 500MG TABS: 500 | 30 days supply | Qty: 120 | Fill #1

## 2018-09-11 MED FILL — LISINOPRIL 40 MG TABLET: 40 | 30 days supply | Qty: 30 | Fill #1

## 2018-09-11 MED FILL — PANTOPRAZOLE SOD DR 40 MG T: 40 | 30 days supply | Qty: 30 | Fill #1

## 2018-09-11 MED FILL — ?FUROSEMIDE 20 MG TABLET: 20 | 30 days supply | Qty: 60 | Fill #1

## 2018-09-21 MED FILL — $VICTOZA 2-PAK 18MG/3ML PEN: 18 | 84 days supply | Qty: 27 | Fill #2

## 2018-09-25 MED FILL — CLOPIDOGREL 75 MG TABLET: 75 | 30 days supply | Qty: 30 | Fill #1

## 2018-09-25 MED FILL — GLIMEPIRIDE 4 MG TABS: 4 | 30 days supply | Qty: 60 | Fill #1

## 2018-09-25 MED FILL — METOPROLOL TARTRATE 100 MG: 100 | 30 days supply | Qty: 60 | Fill #2

## 2018-10-05 ENCOUNTER — Ambulatory Visit: Payer: Self-pay | Attending: Family Medicine | Admitting: Pharmacist

## 2018-10-05 ENCOUNTER — Encounter: Payer: Self-pay | Admitting: Pharmacist

## 2018-10-05 DIAGNOSIS — Z7984 Long term (current) use of oral hypoglycemic drugs: Secondary | ICD-10-CM | POA: Insufficient documentation

## 2018-10-05 DIAGNOSIS — Z79899 Other long term (current) drug therapy: Secondary | ICD-10-CM | POA: Insufficient documentation

## 2018-10-05 DIAGNOSIS — Z7982 Long term (current) use of aspirin: Secondary | ICD-10-CM | POA: Insufficient documentation

## 2018-10-05 DIAGNOSIS — E1169 Type 2 diabetes mellitus with other specified complication: Secondary | ICD-10-CM

## 2018-10-05 DIAGNOSIS — E1165 Type 2 diabetes mellitus with hyperglycemia: Secondary | ICD-10-CM | POA: Insufficient documentation

## 2018-10-05 DIAGNOSIS — Z833 Family history of diabetes mellitus: Secondary | ICD-10-CM | POA: Insufficient documentation

## 2018-10-05 LAB — GLUCOSE, POCT (MANUAL RESULT ENTRY): POC Glucose: 142 mg/dl — AB (ref 70–99)

## 2018-10-05 NOTE — Progress Notes (Signed)
    S:    PCP: Dr. Margarita Rana  No chief complaint on file.  Patient arrives in good spirits. Presents for diabetes management at the request of Dr. Margarita Rana. Patient was referred and last seen by her on 07/30/18. I last saw patient 09/03/18. Instructed patient to decrease glimepiride d/t hypoglycemia. She reports still taking BID.  Patient denies adherence with medications.  Current diabetes medications include:  - Glimepiride 4 mg daily. Reports still taking BID. 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  Patient denies neuropathy. Patient denies visual changes.  O:  POCT: 142 (fasting)  Home fasting CBG: 73 - 153; 1 outlier 174 2 hour post-prandial/random CBG: none reported  Lab Results  Component Value Date   HGBA1C 8.1 (H) 07/19/2018   There were no vitals filed for this visit.  Lipid Panel     Component Value Date/Time   CHOL 129 02/17/2018 1017   TRIG 103 02/17/2018 1017   HDL 42 02/17/2018 1017   CHOLHDL 3.1 02/17/2018 1017   CHOLHDL 2.4 02/11/2017 1002   VLDL 18 02/11/2017 1002   LDLCALC 66 02/17/2018 1017   Clinical ASCVD: Yes   A/P: Diabetes longstanding currently uncontrolled based on A1c of 8.1. She is able to verbalize appropriate hypoglycemia management plan. Patient is adherent with medication.   Home levels improving since addition of Victoza. She continues to take glimepiride BID and is doing well. Will continue for now and have her follow-up with PCP. Pt with clinical ASCVD. She is on GLP-1 RA therapy. Agree that Vania Rea should be added in the  future if continued A1c lowering is required.  -Continued metformin 500mg  2 tablets BID. -Continued glimepiride to 4 mg BID.  -Continued Victoza 1.8 mg daily. -Extensively discussed pathophysiology of DM, recommended lifestyle interventions, dietary effects on glycemic control -Counseled on s/sx of and management of hypoglycemia -Next A1C anticipated 11/19.   ASCVD risk - secondary prevention in patient with DM. Last LDL is controlled. high intensity statin indicated. Aspirin is indicated.  -Continued aspirin 81 mg  -Continued atorvastatin 80 mg.   Written patient instructions provided.  Total time in face to face counseling 15 minutes.   Follow up Pharmacist Clinic Visit 10/05/18.     Benard Halsted, PharmD, [place] [phone]

## 2018-10-05 NOTE — Patient Instructions (Addendum)
Thank you for coming to see me today. Please do the following:  1. Continue medications as prescribed.  2. Continue checking blood sugars at home. 3. Continue making the lifestyle changes we've discussed together during our visit. Diet and exercise play a significant role in improving your blood sugars.  4. Follow-up with PCP on 11/02/18.   Hypoglycemia or low blood sugar:   Low blood sugar can happen quickly and may become an emergency if not treated right away.   While this shouldn't happen often, it can be brought upon if you skip a meal or do not eat enough. Also, if your insulin or other diabetes medications are dosed too high, this can cause your blood sugar to go to low.   Warning signs of low blood sugar include: 1. Feeling shaky or dizzy 2. Feeling weak or tired  3. Excessive hunger 4. Feeling anxious or upset  5. Sweating even when you aren't exercising  What to do if I experience low blood sugar? 1. Check your blood sugar with your meter. If lower than 70, proceed to step 2.  2. Treat with 3-4 glucose tablets or 3 packets of regular sugar. If these aren't around, you can try hard candy. Yet another option would be to drink 4 ounces of fruit juice or 6 ounces of REGULAR soda.  3. Re-check your sugar in 15 minutes. If it is still below 70, do what you did in step 2 again. If has come back up, go ahead and eat a snack or small meal at this time.

## 2018-10-12 MED FILL — FUROSEMIDE 20 MG TABLET: 20 | 30 days supply | Qty: 60 | Fill #2

## 2018-10-12 MED FILL — LISINOPRIL 40 MG TABLET: 40 | 30 days supply | Qty: 30 | Fill #2

## 2018-10-12 MED FILL — ATORVASTATIN 80 MG TABLET: 80 | 30 days supply | Qty: 30 | Fill #2

## 2018-10-19 MED FILL — metFORMIN HCL 500 MG TABS: 500 | 30 days supply | Qty: 120 | Fill #2

## 2018-10-19 MED FILL — ISOSORBIDE MN ER 30 MG TAB: 30 | 30 days supply | Qty: 60 | Fill #0

## 2018-10-19 MED FILL — PANTOPRAZOLE SOD DR 40 MG T: 40 | 30 days supply | Qty: 30 | Fill #2

## 2018-10-23 ENCOUNTER — Ambulatory Visit: Payer: Self-pay | Admitting: Cardiovascular Disease

## 2018-10-23 DIAGNOSIS — R0989 Other specified symptoms and signs involving the circulatory and respiratory systems: Secondary | ICD-10-CM

## 2018-10-26 MED FILL — GLIMEPIRIDE 4 MG TABS: 4 | 30 days supply | Qty: 60 | Fill #2

## 2018-10-29 ENCOUNTER — Encounter: Payer: Self-pay | Admitting: Cardiovascular Disease

## 2018-11-02 ENCOUNTER — Ambulatory Visit: Payer: Self-pay | Attending: Family Medicine | Admitting: Family Medicine

## 2018-11-02 ENCOUNTER — Encounter: Payer: Self-pay | Admitting: Family Medicine

## 2018-11-02 VITALS — BP 149/85 | HR 83 | Temp 98.0°F | Ht 62.0 in | Wt 219.2 lb

## 2018-11-02 DIAGNOSIS — Z9861 Coronary angioplasty status: Secondary | ICD-10-CM

## 2018-11-02 DIAGNOSIS — Z955 Presence of coronary angioplasty implant and graft: Secondary | ICD-10-CM | POA: Insufficient documentation

## 2018-11-02 DIAGNOSIS — Z794 Long term (current) use of insulin: Secondary | ICD-10-CM | POA: Insufficient documentation

## 2018-11-02 DIAGNOSIS — K219 Gastro-esophageal reflux disease without esophagitis: Secondary | ICD-10-CM | POA: Insufficient documentation

## 2018-11-02 DIAGNOSIS — Z79899 Other long term (current) drug therapy: Secondary | ICD-10-CM | POA: Insufficient documentation

## 2018-11-02 DIAGNOSIS — I10 Essential (primary) hypertension: Secondary | ICD-10-CM

## 2018-11-02 DIAGNOSIS — Z6841 Body Mass Index (BMI) 40.0 and over, adult: Secondary | ICD-10-CM | POA: Insufficient documentation

## 2018-11-02 DIAGNOSIS — E785 Hyperlipidemia, unspecified: Secondary | ICD-10-CM | POA: Insufficient documentation

## 2018-11-02 DIAGNOSIS — I5032 Chronic diastolic (congestive) heart failure: Secondary | ICD-10-CM | POA: Insufficient documentation

## 2018-11-02 DIAGNOSIS — I2510 Atherosclerotic heart disease of native coronary artery without angina pectoris: Secondary | ICD-10-CM | POA: Insufficient documentation

## 2018-11-02 DIAGNOSIS — Z7982 Long term (current) use of aspirin: Secondary | ICD-10-CM | POA: Insufficient documentation

## 2018-11-02 DIAGNOSIS — Z7902 Long term (current) use of antithrombotics/antiplatelets: Secondary | ICD-10-CM | POA: Insufficient documentation

## 2018-11-02 DIAGNOSIS — E1169 Type 2 diabetes mellitus with other specified complication: Secondary | ICD-10-CM | POA: Insufficient documentation

## 2018-11-02 DIAGNOSIS — I110 Hypertensive heart disease with heart failure: Secondary | ICD-10-CM | POA: Insufficient documentation

## 2018-11-02 LAB — POCT GLYCOSYLATED HEMOGLOBIN (HGB A1C): Hemoglobin A1C: 7.3 % — AB (ref 4.0–5.6)

## 2018-11-02 LAB — GLUCOSE, POCT (MANUAL RESULT ENTRY): POC GLUCOSE: 135 mg/dL — AB (ref 70–99)

## 2018-11-02 MED FILL — METOPROLOL TARTRATE 100 MG: 100 | 30 days supply | Qty: 60 | Fill #3

## 2018-11-02 MED FILL — CLOPIDOGREL 75 MG TABLET: 75 | 30 days supply | Qty: 30 | Fill #2

## 2018-11-02 NOTE — Progress Notes (Signed)
Subjective:  Patient ID: [name], female    DOB: 1956/06/22  Age: 62 y.o. MRN: [id number]  CC: Diabetes   HPI [name] is a 62 year old female with a history of type 2 diabetes mellitus (A1c 7.3), dyslipidemia, coronary artery disease status post DES stent (in 08/2015 and was on anticoagulation with Brilinta and aspirin for 12 months), diastolic heart failure (EF 65-70%, grade 1 DD from echo of 08/2015), essential hypertension Her A1c 7.3 which has improved from 8.1 previously and she is doing well on Victoza and has been compliant with her antihypertensive and her statin.  She does not exercise regularly but is working on a diabetic diet. She denies chest pains or shortness of breath and has not been to see her cardiologist lately plans to reschedule. Cardiac cath from 07/2018 had revealed occlusion of the distal left circumflex artery and occlusion/to subtotally occluded diagonal vessel and medical therapy recommended due to small caliber vessel.  Compliant with Plavix which was initiated after her hospitalization for angina 3 months ago (as per cardiology notes uninterrupted dual antiplatelet therapy with aspirin and Plavix for a minimum of 1 month and longer if tolerated due to multiple stents and recent occlusion). She has no additional concerns at this time  Past Medical History:  Diagnosis Date  . Anginal pain ([place])   . Coronary atherosclerosis of native coronary artery, with stent to LCX in 2006 06/04/2013  . Diabetes mellitus without complication ([place])   . GERD (gastroesophageal reflux disease)   . Heart attack ([place])   . Hyperlipidemia LDL goal < 70 06/07/2013  . Hypertension   . S/P coronary artery stent placement to RCA with PROMUS DES, 06/07/13, residual disease on OM1, OM2 and rPDA 06/07/2013    Past Surgical History:  Procedure Laterality Date  . CARDIAC CATHETERIZATION N/A 08/22/2015   Procedure: Left Heart Cath and Coronary Angiography;  Surgeon: Leonie Man, MD;   Location: [place] [facility];  Service: Cardiovascular;  Laterality: N/A;  . CARDIAC CATHETERIZATION N/A 08/22/2015   Procedure: Coronary Stent Intervention;  Surgeon: Leonie Man, MD;  Location: [place] [facility];  Service: Cardiovascular;  Laterality: N/A;  . CARDIAC CATHETERIZATION N/A 08/22/2015   Procedure: Left Heart Cath and Coronary Angiography;  Surgeon: Leonie Man, MD;  Location: [place] [facility];  Service: Cardiovascular;  Laterality: N/A;  . CORONARY STENT PLACEMENT  2006   TAXUS stent [facility]X in [facility]  . LEFT HEART CATH AND CORONARY ANGIOGRAPHY N/A 07/21/2018   Procedure: LEFT HEART CATH AND CORONARY ANGIOGRAPHY;  Surgeon: Troy Sine, MD;  Location: [place] [facility];  Service: Cardiovascular;  Laterality: N/A;  . LEFT HEART CATHETERIZATION WITH CORONARY ANGIOGRAM N/A 06/07/2013   Procedure: LEFT HEART CATHETERIZATION WITH CORONARY ANGIOGRAM;  Surgeon: Sanda Klein, MD;  Location: [place] [facility];  Service: Cardiovascular;  Laterality: N/A;    No Known Allergies    Outpatient Medications Prior to Visit  Medication Sig Dispense Refill  . acetaminophen (TYLENOL) 500 MG tablet Take 1,000 mg by mouth daily as needed for moderate pain.     Marland Kitchen aspirin EC 81 MG EC tablet Take 1 tablet (81 mg total) by mouth daily.    Marland Kitchen atorvastatin (LIPITOR) 80 MG tablet TAKE 1 TABLET BY MOUTH DAILY AT 6 PM 30 tablet 6  . clopidogrel (PLAVIX) 75 MG tablet Take 1 tablet (75 mg total) by mouth daily with breakfast. 30 tablet 6  . furosemide (LASIX) 20 MG tablet Take  2 tablets (40 mg total) by mouth daily. 60 tablet 6  . glimepiride (AMARYL) 4 MG tablet TAKE 1 TABLET BY MOUTH 2 TIMES DAILY WITH A MEAL. 60 tablet 6  . Insulin Pen Needle (TRUEPLUS PEN NEEDLES) 32G X 4 MM MISC Use as directed to inject victoza once daily 100 each 3  . isosorbide mononitrate (IMDUR) 30 MG 24 hr tablet Take 2 tablets (60 mg total) by mouth daily. 60 tablet 6  . liraglutide (VICTOZA) 18 MG/3ML SOPN Inject 0.3 mLs  (1.8 mg total) into the skin daily with breakfast. Start with 0.1 mL daily for 1 week then 0.2 mL for 1 week then 0.3 mL 3 pen 6  . lisinopril (PRINIVIL,ZESTRIL) 40 MG tablet Take 1 tablet (40 mg total) by mouth daily. 30 tablet 6  . metFORMIN (GLUCOPHAGE) 500 MG tablet Take 2 tablets (1,000 mg total) by mouth 2 (two) times daily with a meal. 120 tablet 6  . metoprolol tartrate (LOPRESSOR) 100 MG tablet Take 1 tablet (100 mg total) by mouth 2 (two) times daily. 60 tablet 6  . nitroGLYCERIN (NITROSTAT) 0.4 MG SL tablet Place 1 tablet (0.4 mg total) under the tongue every 5 (five) minutes as needed for chest pain. 25 tablet 2  . pantoprazole (PROTONIX) 40 MG tablet Take 1 tablet (40 mg total) by mouth daily. 30 tablet 6  . ranitidine (ZANTAC) 150 MG capsule Take 150 mg by mouth daily.      No facility-administered medications prior to visit.     ROS Review of Systems  Constitutional: Negative for activity change, appetite change and fatigue.  HENT: Negative for congestion, sinus pressure and sore throat.   Eyes: Negative for visual disturbance.  Respiratory: Negative for cough, chest tightness, shortness of breath and wheezing.   Cardiovascular: Negative for chest pain and palpitations.  Gastrointestinal: Negative for abdominal distention, abdominal pain and constipation.  Endocrine: Negative for polydipsia.  Genitourinary: Negative for dysuria and frequency.  Musculoskeletal: Negative for arthralgias and back pain.  Skin: Negative for rash.  Neurological: Negative for tremors, light-headedness and numbness.  Hematological: Does not bruise/bleed easily.  Psychiatric/Behavioral: Negative for agitation and behavioral problems.    Objective:  BP (!) 149/85   Pulse 83   Temp 98 F (36.7 C) (Oral)   Ht 5\' 2"  (1.575 m)   Wt 219 lb 3.2 oz (99.4 kg)   SpO2 99%   BMI 40.09 kg/m   BP/Weight 11/02/2018 2/40/9735 02/15/9923  Systolic BP 268 341 962  Diastolic BP 85 71 78  Wt. (Lbs) 219.2  214 210  BMI 40.09 39.14 38.41      Physical Exam  Constitutional: She is oriented to person, place, and time. She appears well-developed and well-nourished.  Cardiovascular: Normal rate, normal heart sounds and intact distal pulses.  No murmur heard. Pulmonary/Chest: Effort normal and breath sounds normal. She has no wheezes. She has no rales. She exhibits no tenderness.  Abdominal: Soft. Bowel sounds are normal. She exhibits no distension and no mass. There is no tenderness.  Musculoskeletal: Normal range of motion.  Neurological: She is alert and oriented to person, place, and time.  Skin: Skin is warm and dry.  Psychiatric: She has a normal mood and affect.     CMP Latest Ref Rng & Units 08/03/2018 07/21/2018 07/20/2018  Glucose 65 - 99 mg/dL 168(H) 263(H) 216(H)  BUN 6 - 24 mg/dL 13 30(H) 27(H)  Creatinine 0.57 - 1.00 mg/dL 0.77 0.93 0.86  Sodium 134 - 144 mmol/L 142  139 140  Potassium 3.5 - 5.2 mmol/L 4.0 4.6 4.5  Chloride 96 - 106 mmol/L 103 106 107  CO2 20 - 29 mmol/L 22 24 23   Calcium 8.7 - 10.2 mg/dL 9.6 8.9 9.0  Total Protein 6.0 - 8.5 g/dL - - -  Total Bilirubin 0.0 - 1.2 mg/dL - - -  Alkaline Phos 39 - 117 IU/L - - -  AST 0 - 40 IU/L - - -  ALT 0 - 32 IU/L - - -    Lipid Panel     Component Value Date/Time   CHOL 129 02/17/2018 1017   TRIG 103 02/17/2018 1017   HDL 42 02/17/2018 1017   CHOLHDL 3.1 02/17/2018 1017   CHOLHDL 2.4 02/11/2017 1002   VLDL 18 02/11/2017 1002   LDLCALC 66 02/17/2018 1017    Lab Results  Component Value Date   HGBA1C 7.3 (A) 11/02/2018     Assessment & Plan:   1. Type 2 diabetes mellitus with other specified complication, without long-term current use of insulin (HCC) Controlled with A1c of 7.3 which has improved from 8.1 previously Continue current regimen Counseled on Diabetic diet, my plate method, 623 minutes of moderate intensity exercise/week Keep blood sugar logs with fasting goals of 80-120 mg/dl, random of less than  180 and in the event of sugars less than 60 mg/dl or greater than 400 mg/dl please notify the clinic ASAP. It is recommended that you undergo annual eye exams and annual foot exams. Pneumonia vaccine is recommended. Declines podiatry referral for treatment of right foot callus - POCT glucose (manual entry) - POCT glycosylated hemoglobin (Hb A1C)  2. Chronic diastolic CHF (congestive heart failure) (HCC) Euvolemic Continue ACE inhibitor, blood beta-blocker, Lasix Daily weights, fluid restriction  3. CAD -S/P RCA DES 08/22/15 Completed Brilinta for 12 months but was restarted on aspirin and Plavix after episode of angina 3 months ago  4. Essential hypertension Controlled Counseled on blood pressure goal of less than 130/80, low-sodium, DASH diet, medication compliance, 150 minutes of moderate intensity exercise per week. Discussed medication compliance, adverse effects.   5. Class 3 severe obesity due to excess calories with serious comorbidity and body mass index (BMI) of 40.0 to 44.9 in adult [facility]) Advised to increase physical activity, reduce portion sizes   No orders of the defined types were placed in this encounter.   Follow-up: Return in about 3 months (around 02/02/2019) for Follow-up of chronic medical conditions.   [name] MD

## 2018-11-02 NOTE — Patient Instructions (Signed)

## 2018-11-16 MED FILL — metFORMIN HCL 500 MG TABS: 500 | 30 days supply | Qty: 120 | Fill #3

## 2018-11-16 MED FILL — LISINOPRIL 40 MG TABLET: 40 | 30 days supply | Qty: 30 | Fill #3

## 2018-11-16 MED FILL — ATORVASTATIN 80 MG TABLET: 80 | 30 days supply | Qty: 30 | Fill #3

## 2018-11-16 MED FILL — ISOSORBIDE MN ER 30 MG TAB: 30 | 30 days supply | Qty: 60 | Fill #1

## 2018-11-25 MED FILL — CLOPIDOGREL 75 MG TABLET: 75 | 30 days supply | Qty: 30 | Fill #3

## 2018-11-25 MED FILL — FUROSEMIDE 20 MG TABLET: 20 | 30 days supply | Qty: 60 | Fill #3

## 2018-11-25 MED FILL — PANTOPRAZOLE SOD DR 40 MG T: 40 | 30 days supply | Qty: 30 | Fill #3

## 2018-11-30 MED FILL — METOPROLOL TARTRATE 100 MG: 100 | 30 days supply | Qty: 60 | Fill #0

## 2018-11-30 MED FILL — GLIMEPIRIDE 4 MG TABS: 4 | 30 days supply | Qty: 60 | Fill #3

## 2018-12-21 MED FILL — PANTOPRAZOLE SOD DR 40 MG T: 40 | 30 days supply | Qty: 30 | Fill #4

## 2018-12-21 MED FILL — LISINOPRIL 40 MG TABLET: 40 | 30 days supply | Qty: 30 | Fill #4

## 2018-12-21 MED FILL — CLOPIDOGREL 75 MG TABLET: 75 | 30 days supply | Qty: 30 | Fill #4

## 2018-12-21 MED FILL — FUROSEMIDE 20 MG TABLET: 20 | 30 days supply | Qty: 60 | Fill #4

## 2018-12-21 MED FILL — ISOSORBIDE MN ER 30 MG TAB: 30 | 30 days supply | Qty: 60 | Fill #2

## 2018-12-21 MED FILL — ATORVASTATIN 80 MG TABLET: 80 | 30 days supply | Qty: 30 | Fill #4

## 2018-12-21 MED FILL — metFORMIN HCL 500 MG TABS: 500 | 30 days supply | Qty: 120 | Fill #4

## 2019-01-05 MED FILL — METOPROLOL TARTRATE 100 MG: 100 | 30 days supply | Qty: 60 | Fill #1

## 2019-01-15 MED FILL — $VICTOZA 2-PAK 18MG/3ML PEN: 18 | 74 days supply | Qty: 24 | Fill #3

## 2019-01-21 MED FILL — ?METFORMIN HCL 500MG TABLET: 500 | 30 days supply | Qty: 120 | Fill #5

## 2019-01-21 MED FILL — ATORVASTATIN 80 MG TABLET: 80 | 30 days supply | Qty: 30 | Fill #5

## 2019-01-21 MED FILL — CLOPIDOGREL 75 MG TABLET: 75 | 30 days supply | Qty: 30 | Fill #5

## 2019-01-21 MED FILL — ISOSORBIDE MN ER 30 MG TAB: 30 | 30 days supply | Qty: 60 | Fill #3

## 2019-01-21 MED FILL — ?PANTOPRAZOLE SOD DR 40MG T: 40 | 30 days supply | Qty: 30 | Fill #5

## 2019-01-21 MED FILL — LISINOPRIL 40 MG TABLET: 40 | 30 days supply | Qty: 30 | Fill #5

## 2019-01-21 MED FILL — ?FUROSEMIDE 20 MG TABLET: 20 | 30 days supply | Qty: 60 | Fill #5

## 2019-02-02 MED FILL — METOPROLOL TARTRATE 100 MG: 100 | 30 days supply | Qty: 60 | Fill #2

## 2019-02-04 ENCOUNTER — Ambulatory Visit: Payer: Self-pay | Admitting: Family Medicine

## 2019-02-16 MED FILL — ISOSORBIDE MN ER 30 MG TAB: 30 | 30 days supply | Qty: 60 | Fill #4

## 2019-02-16 MED FILL — ?METFORMIN HCL 500MG TABL: 500 | 30 days supply | Qty: 120 | Fill #6

## 2019-02-16 MED FILL — ATORVASTATIN 80 MG TABLET: 80 | 30 days supply | Qty: 30 | Fill #6

## 2019-02-16 MED FILL — CLOPIDOGREL 75 MG TABLET: 75 | 30 days supply | Qty: 30 | Fill #6

## 2019-02-16 MED FILL — LISINOPRIL 40 MG TABLET: 40 | 30 days supply | Qty: 30 | Fill #6

## 2019-02-16 MED FILL — ?PANTOPRAZOLE SOD DR 40MG T: 40 | 30 days supply | Qty: 30 | Fill #6

## 2019-02-16 MED FILL — ?FUROSEMIDE 20 MG TABLET: 20 | 30 days supply | Qty: 60 | Fill #6

## 2019-02-25 ENCOUNTER — Other Ambulatory Visit: Payer: Self-pay

## 2019-02-25 ENCOUNTER — Other Ambulatory Visit: Payer: Self-pay | Admitting: Family Medicine

## 2019-02-25 ENCOUNTER — Ambulatory Visit: Payer: Self-pay | Attending: Family Medicine | Admitting: Family Medicine

## 2019-02-25 VITALS — BP 157/91 | HR 98 | Temp 97.8°F | Ht 62.0 in | Wt 225.0 lb

## 2019-02-25 DIAGNOSIS — E11649 Type 2 diabetes mellitus with hypoglycemia without coma: Secondary | ICD-10-CM

## 2019-02-25 DIAGNOSIS — I2510 Atherosclerotic heart disease of native coronary artery without angina pectoris: Secondary | ICD-10-CM

## 2019-02-25 DIAGNOSIS — E1169 Type 2 diabetes mellitus with other specified complication: Secondary | ICD-10-CM

## 2019-02-25 DIAGNOSIS — I10 Essential (primary) hypertension: Secondary | ICD-10-CM

## 2019-02-25 DIAGNOSIS — E785 Hyperlipidemia, unspecified: Secondary | ICD-10-CM

## 2019-02-25 DIAGNOSIS — I5032 Chronic diastolic (congestive) heart failure: Secondary | ICD-10-CM

## 2019-02-25 DIAGNOSIS — Z9861 Coronary angioplasty status: Secondary | ICD-10-CM

## 2019-02-25 LAB — POCT GLYCOSYLATED HEMOGLOBIN (HGB A1C): HBA1C, POC (CONTROLLED DIABETIC RANGE): 9.2 % — AB (ref 0.0–7.0)

## 2019-02-25 LAB — GLUCOSE, POCT (MANUAL RESULT ENTRY): POC Glucose: 161 mg/dl — AB (ref 70–99)

## 2019-02-25 MED ORDER — LISINOPRIL-HYDROCHLOROTHIAZIDE 20-12.5 MG PO TABS: 2.0000 | ORAL_TABLET | Freq: Every day | ORAL | 1 refills | Status: DC

## 2019-02-25 MED ORDER — GLIMEPIRIDE 4 MG PO TABS: ORAL_TABLET | ORAL | 1 refills | Status: DC

## 2019-02-25 MED ORDER — PANTOPRAZOLE SODIUM 40 MG PO TBEC: 40.0000 mg | DELAYED_RELEASE_TABLET | Freq: Every day | ORAL | 1 refills | Status: DC

## 2019-02-25 MED ORDER — METFORMIN HCL 500 MG PO TABS: 1000.0000 mg | ORAL_TABLET | Freq: Two times a day (BID) | ORAL | 1 refills | Status: DC

## 2019-02-25 MED ORDER — FUROSEMIDE 20 MG PO TABS: 40.0000 mg | ORAL_TABLET | Freq: Every day | ORAL | 1 refills | Status: DC

## 2019-02-25 MED ORDER — CLOPIDOGREL BISULFATE 75 MG PO TABS: 75.0000 mg | ORAL_TABLET | Freq: Every day | ORAL | 1 refills | Status: DC

## 2019-02-25 MED ORDER — ATORVASTATIN CALCIUM 80 MG PO TABS: ORAL_TABLET | ORAL | 1 refills | Status: DC

## 2019-02-25 MED ORDER — METOPROLOL TARTRATE 100 MG PO TABS: 100.0000 mg | ORAL_TABLET | Freq: Two times a day (BID) | ORAL | 1 refills | Status: DC

## 2019-02-25 MED ORDER — ISOSORBIDE MONONITRATE ER 30 MG PO TB24: 60.0000 mg | ORAL_TABLET | Freq: Every day | ORAL | 1 refills | Status: DC

## 2019-02-25 MED ORDER — LIRAGLUTIDE 18 MG/3ML ~~LOC~~ SOPN: 1.8000 mg | PEN_INJECTOR | Freq: Every day | SUBCUTANEOUS | 6 refills | Status: DC

## 2019-02-25 NOTE — Progress Notes (Signed)
Subjective:  Patient ID: [name], female    DOB: 1956-12-11  Age: 63 y.o. MRN: [id number]  CC: Diabetes   HPI [name] is a 63 year old female with a history of type 2 diabetes mellitus (A1c 9.2), dyslipidemia, coronary artery disease status post DES stent (in 08/2015 completed anticoagulation with Brilinta and aspirin for 12 months, restarted on Plavix by cardiology due to multiple stents and recent occlusion from recent cardiac cath), diastolic heart failure (EF 65-70%, grade 1 DD from echo of 08/2015), essential hypertension Her A1c is 9.2 which has trended up from 7.3 previously and she endorses not compliant with a diabetic diet that she has been eating unhealthy and has not been exercising. Her blood pressure is elevated and she endorses compliance with her antihypertensive.  She denies chest pain or shortness of breath. With regards to her diastolic heart failure she denies dyspnea, pedal edema, paroxysmal nocturnal dyspnea. Cardiac cath from 07/2018 had revealed occlusion of the distal left circumflex artery and occlusion/to subtotally occluded diagonal vessel and medical therapy recommended due to small caliber vessel.  Past Medical History:  Diagnosis Date  . Anginal pain ([place])   . Coronary atherosclerosis of native coronary artery, with stent to LCX in 2006 06/04/2013  . Diabetes mellitus without complication ([place])   . GERD (gastroesophageal reflux disease)   . Heart attack ([place])   . Hyperlipidemia LDL goal < 70 06/07/2013  . Hypertension   . S/P coronary artery stent placement to RCA with PROMUS DES, 06/07/13, residual disease on OM1, OM2 and rPDA 06/07/2013    Past Surgical History:  Procedure Laterality Date  . CARDIAC CATHETERIZATION N/A 08/22/2015   Procedure: Left Heart Cath and Coronary Angiography;  Surgeon: Leonie Man, MD;  Location: [facility];  Service: Cardiovascular;  Laterality: N/A;  . CARDIAC CATHETERIZATION N/A 08/22/2015   Procedure:  Coronary Stent Intervention;  Surgeon: Leonie Man, MD;  Location: [place];  Service: Cardiovascular;  Laterality: N/A;  . CARDIAC CATHETERIZATION N/A 08/22/2015   Procedure: Left Heart Cath and Coronary Angiography;  Surgeon: Leonie Man, MD;  Location: [place] [facility];  Service: Cardiovascular;  Laterality: N/A;  . CORONARY STENT PLACEMENT  2006   TAXUS stent CFX in [facility]  . LEFT HEART CATH AND CORONARY ANGIOGRAPHY N/A 07/21/2018   Procedure: LEFT HEART CATH AND CORONARY ANGIOGRAPHY;  Surgeon: Troy Sine, MD;  Location: [facility];  Service: Cardiovascular;  Laterality: N/A;  . LEFT HEART CATHETERIZATION WITH CORONARY ANGIOGRAM N/A 06/07/2013   Procedure: LEFT HEART CATHETERIZATION WITH CORONARY ANGIOGRAM;  Surgeon: Sanda Klein, MD;  Location: [facility];  Service: Cardiovascular;  Laterality: N/A;    Family History  Problem Relation Age of Onset  . Diabetes Mother   . Hypertension Mother   . Diabetes Sister   . Diabetes Brother   . Hypertension Brother     No Known Allergies  Outpatient Medications Prior to Visit  Medication Sig Dispense Refill  . acetaminophen (TYLENOL) 500 MG tablet Take 1,000 mg by mouth daily as needed for moderate pain.     Marland Kitchen aspirin EC 81 MG EC tablet Take 1 tablet (81 mg total) by mouth daily.    . Insulin Pen Needle (TRUEPLUS PEN NEEDLES) 32G X 4 MM MISC Use as directed to inject victoza once daily 100 each 3  . nitroGLYCERIN (NITROSTAT) 0.4 MG SL tablet Place 1 tablet (0.4 mg total) under the tongue every 5 (five)  minutes as needed for chest pain. 25 tablet 2  . ranitidine (ZANTAC) 150 MG capsule Take 150 mg by mouth daily.     Marland Kitchen atorvastatin (LIPITOR) 80 MG tablet TAKE 1 TABLET BY MOUTH DAILY AT 6 PM 30 tablet 6  . clopidogrel (PLAVIX) 75 MG tablet Take 1 tablet (75 mg total) by mouth daily with breakfast. 30 tablet 6  . furosemide (LASIX) 20 MG tablet Take 2 tablets (40 mg total) by mouth daily. 60 tablet 6  .  glimepiride (AMARYL) 4 MG tablet TAKE 1 TABLET BY MOUTH 2 TIMES DAILY WITH A MEAL. 60 tablet 6  . isosorbide mononitrate (IMDUR) 30 MG 24 hr tablet Take 2 tablets (60 mg total) by mouth daily. 60 tablet 6  . liraglutide (VICTOZA) 18 MG/3ML SOPN Inject 0.3 mLs (1.8 mg total) into the skin daily with breakfast. Start with 0.1 mL daily for 1 week then 0.2 mL for 1 week then 0.3 mL 3 pen 6  . lisinopril (PRINIVIL,ZESTRIL) 40 MG tablet Take 1 tablet (40 mg total) by mouth daily. 30 tablet 6  . metFORMIN (GLUCOPHAGE) 500 MG tablet Take 2 tablets (1,000 mg total) by mouth 2 (two) times daily with a meal. 120 tablet 6  . metoprolol tartrate (LOPRESSOR) 100 MG tablet Take 1 tablet (100 mg total) by mouth 2 (two) times daily. 60 tablet 6  . pantoprazole (PROTONIX) 40 MG tablet Take 1 tablet (40 mg total) by mouth daily. 30 tablet 6   No facility-administered medications prior to visit.      ROS Review of Systems  Constitutional: Negative for activity change, appetite change and fatigue.  HENT: Negative for congestion, sinus pressure and sore throat.   Eyes: Negative for visual disturbance.  Respiratory: Negative for cough, chest tightness, shortness of breath and wheezing.   Cardiovascular: Negative for chest pain and palpitations.  Gastrointestinal: Negative for abdominal distention, abdominal pain and constipation.  Endocrine: Negative for polydipsia.  Genitourinary: Negative for dysuria and frequency.  Musculoskeletal: Negative for arthralgias and back pain.  Skin: Negative for rash.  Neurological: Negative for tremors, light-headedness and numbness.  Hematological: Does not bruise/bleed easily.  Psychiatric/Behavioral: Negative for agitation and behavioral problems.    Objective:  BP (!) 157/91   Pulse 98   Temp 97.8 F (36.6 C) (Oral)   Ht '5\' 2"'  (1.575 m)   Wt 225 lb (102.1 kg)   SpO2 98%   BMI 41.15 kg/m   BP/Weight 02/25/2019 11/02/2018 9/50/9326  Systolic BP 712 458 099   Diastolic BP 91 85 71  Wt. (Lbs) 225 219.2 214  BMI 41.15 40.09 39.14      Physical Exam Constitutional:      Appearance: She is well-developed.  Cardiovascular:     Rate and Rhythm: Normal rate.     Heart sounds: Normal heart sounds. No murmur.  Pulmonary:     Effort: Pulmonary effort is normal.     Breath sounds: Normal breath sounds. No wheezing or rales.  Chest:     Chest wall: No tenderness.  Abdominal:     General: Bowel sounds are normal. There is no distension.     Palpations: Abdomen is soft. There is no mass.     Tenderness: There is no abdominal tenderness.  Musculoskeletal: Normal range of motion.  Neurological:     Mental Status: She is alert and oriented to person, place, and time.     CMP Latest Ref Rng & Units 08/03/2018 07/21/2018 07/20/2018  Glucose 65 - 99  mg/dL 168(H) 263(H) 216(H)  BUN 6 - 24 mg/dL 13 30(H) 27(H)  Creatinine 0.57 - 1.00 mg/dL 0.77 0.93 0.86  Sodium 134 - 144 mmol/L 142 139 140  Potassium 3.5 - 5.2 mmol/L 4.0 4.6 4.5  Chloride 96 - 106 mmol/L 103 106 107  CO2 20 - 29 mmol/L '22 24 23  ' Calcium 8.7 - 10.2 mg/dL 9.6 8.9 9.0  Total Protein 6.0 - 8.5 g/dL - - -  Total Bilirubin 0.0 - 1.2 mg/dL - - -  Alkaline Phos 39 - 117 IU/L - - -  AST 0 - 40 IU/L - - -  ALT 0 - 32 IU/L - - -    Lipid Panel     Component Value Date/Time   CHOL 129 02/17/2018 1017   TRIG 103 02/17/2018 1017   HDL 42 02/17/2018 1017   CHOLHDL 3.1 02/17/2018 1017   CHOLHDL 2.4 02/11/2017 1002   VLDL 18 02/11/2017 1002   LDLCALC 66 02/17/2018 1017    CBC    Component Value Date/Time   WBC 6.4 07/21/2018 0623   RBC 4.77 07/21/2018 0623   HGB 14.1 07/21/2018 0623   HCT 44.4 07/21/2018 0623   PLT 229 07/21/2018 0623   MCV 93.1 07/21/2018 0623   MCH 29.6 07/21/2018 0623   MCHC 31.8 07/21/2018 0623   RDW 13.2 07/21/2018 0623   LYMPHSABS 2.1 05/29/2015 1614   MONOABS 0.8 05/29/2015 1614   EOSABS 0.1 05/29/2015 1614   BASOSABS 0.0 05/29/2015 1614    Lab  Results  Component Value Date   HGBA1C 9.2 (A) 02/25/2019    Assessment & Plan:   1. Type 2 diabetes mellitus with other specified complication, without long-term current use of insulin (HCC) Uncontrolled with A1c of 9.2 which has trended up from 7.3 previously We have discussed initiation of Lantus which she declines and would like to work on her lifestyle modifications If at next visit A1c remains elevated we will need to initiate Lantus Counseled on Diabetic diet, my plate method, 678 minutes of moderate intensity exercise/week Keep blood sugar logs with fasting goals of 80-120 mg/dl, random of less than 180 and in the event of sugars less than 60 mg/dl or greater than 400 mg/dl please notify the clinic ASAP. It is recommended that you undergo annual eye exams and annual foot exams. Pneumonia vaccine is recommended. - POCT glucose (manual entry) - POCT glycosylated hemoglobin (Hb A1C) - glimepiride (AMARYL) 4 MG tablet; TAKE 1 TABLET BY MOUTH 2 TIMES DAILY WITH A MEAL.  Dispense: 180 tablet; Refill: 1 - liraglutide (VICTOZA) 18 MG/3ML SOPN; Inject 0.3 mLs (1.8 mg total) into the skin daily with breakfast. Start with 0.1 mL daily for 1 week then 0.2 mL for 1 week then 0.3 mL  Dispense: 3 pen; Refill: 6 - metFORMIN (GLUCOPHAGE) 500 MG tablet; Take 2 tablets (1,000 mg total) by mouth 2 (two) times daily with a meal.  Dispense: 360 tablet; Refill: 1 - Microalbumin / creatinine urine ratio - CMP14+EGFR - Lipid panel  2. Dyslipidemia Controlled Low-cholesterol diet - atorvastatin (LIPITOR) 80 MG tablet; TAKE 1 TABLET BY MOUTH DAILY AT 6 PM  Dispense: 90 tablet; Refill: 1  3. Chronic diastolic heart failure (HCC) EF of 65 to 70% from 08/2015 Euvolemic - furosemide (LASIX) 20 MG tablet; Take 2 tablets (40 mg total) by mouth daily.  Dispense: 180 tablet; Refill: 1 - isosorbide mononitrate (IMDUR) 30 MG 24 hr tablet; Take 2 tablets (60 mg total) by mouth daily.  Dispense: 180 tablet; Refill:  1  4. Essential hypertension Uncontrolled Increased dose of lisinopril/HCTZ Counseled on blood pressure goal of less than 130/80, low-sodium, DASH diet, medication compliance, 150 minutes of moderate intensity exercise per week. Discussed medication compliance, adverse effects. - lisinopril-hydrochlorothiazide (ZESTORETIC) 20-12.5 MG tablet; Take 2 tablets by mouth daily.  Dispense: 180 tablet; Refill: 1 - metoprolol tartrate (LOPRESSOR) 100 MG tablet; Take 1 tablet (100 mg total) by mouth 2 (two) times daily.  Dispense: 90 tablet; Refill: 1  5. CAD -S/P RCA DES 08/22/15 No angina at this time Risk factor modification See cardiac cath report above. - clopidogrel (PLAVIX) 75 MG tablet; Take 1 tablet (75 mg total) by mouth daily with breakfast.  Dispense: 90 tablet; Refill: 1   Meds ordered this encounter  Medications  . atorvastatin (LIPITOR) 80 MG tablet    Sig: TAKE 1 TABLET BY MOUTH DAILY AT 6 PM    Dispense:  90 tablet    Refill:  1  . clopidogrel (PLAVIX) 75 MG tablet    Sig: Take 1 tablet (75 mg total) by mouth daily with breakfast.    Dispense:  90 tablet    Refill:  1  . furosemide (LASIX) 20 MG tablet    Sig: Take 2 tablets (40 mg total) by mouth daily.    Dispense:  180 tablet    Refill:  1  . glimepiride (AMARYL) 4 MG tablet    Sig: TAKE 1 TABLET BY MOUTH 2 TIMES DAILY WITH A MEAL.    Dispense:  180 tablet    Refill:  1  . isosorbide mononitrate (IMDUR) 30 MG 24 hr tablet    Sig: Take 2 tablets (60 mg total) by mouth daily.    Dispense:  180 tablet    Refill:  1  . liraglutide (VICTOZA) 18 MG/3ML SOPN    Sig: Inject 0.3 mLs (1.8 mg total) into the skin daily with breakfast. Start with 0.1 mL daily for 1 week then 0.2 mL for 1 week then 0.3 mL    Dispense:  3 pen    Refill:  6  . lisinopril-hydrochlorothiazide (ZESTORETIC) 20-12.5 MG tablet    Sig: Take 2 tablets by mouth daily.    Dispense:  180 tablet    Refill:  1    Discontinue lisinopril  . metFORMIN  (GLUCOPHAGE) 500 MG tablet    Sig: Take 2 tablets (1,000 mg total) by mouth 2 (two) times daily with a meal.    Dispense:  360 tablet    Refill:  1  . metoprolol tartrate (LOPRESSOR) 100 MG tablet    Sig: Take 1 tablet (100 mg total) by mouth 2 (two) times daily.    Dispense:  90 tablet    Refill:  1  . pantoprazole (PROTONIX) 40 MG tablet    Sig: Take 1 tablet (40 mg total) by mouth daily.    Dispense:  90 tablet    Refill:  1    Follow-up: Return in about 3 months (around 05/28/2019) for Follow-up of chronic medical conditions.       [name], MD, FAAFP. [facility] and [place], [place]   02/25/2019, 3:19 PM

## 2019-02-26 LAB — COMPREHENSIVE METABOLIC PANEL
A/G RATIO: 1.7 (ref 1.2–2.2)
ALK PHOS: 114 IU/L (ref 39–117)
ALT: 18 IU/L (ref 0–32)
AST: 15 IU/L (ref 0–40)
Albumin: 4 g/dL (ref 3.8–4.9)
BILIRUBIN TOTAL: 0.3 mg/dL (ref 0.0–1.2)
BUN/Creatinine Ratio: 14 (ref 12–28)
BUN: 11 mg/dL (ref 8–27)
CHLORIDE: 104 mmol/L (ref 96–106)
CO2: 23 mmol/L (ref 20–29)
Calcium: 9 mg/dL (ref 8.7–10.3)
Creatinine, Ser: 0.77 mg/dL (ref 0.57–1.00)
GFR calc Af Amer: 97 mL/min/{1.73_m2} (ref >59)
GFR calc non Af Amer: 84 mL/min/{1.73_m2} (ref >59)
Globulin, Total: 2.3 g/dL (ref 1.5–4.5)
Glucose: 179 mg/dL — ABNORMAL HIGH (ref 65–99)
POTASSIUM: 3.9 mmol/L (ref 3.5–5.2)
Sodium: 145 mmol/L — ABNORMAL HIGH (ref 134–144)
Total Protein: 6.3 g/dL (ref 6.0–8.5)

## 2019-02-26 LAB — LIPID PANEL W/O CHOL/HDL RATIO
CHOLESTEROL TOTAL: 113 mg/dL (ref 100–199)
HDL: 46 mg/dL (ref >39)
LDL Calculated: 49 mg/dL (ref 0–99)
TRIGLYCERIDES: 88 mg/dL (ref 0–149)
VLDL CHOLESTEROL CAL: 18 mg/dL (ref 5–40)

## 2019-02-26 LAB — MICROALBUMIN / CREATININE URINE RATIO
Creatinine, Urine: 76.1 mg/dL
Microalb/Creat Ratio: 143 mg/g creat — ABNORMAL HIGH (ref 0–29)
Microalbumin, Urine: 108.6 ug/mL

## 2019-02-26 MED FILL — LISINOPRIL-HCTZ 20-12.5 MG: 20-12.5 | 90 days supply | Qty: 180 | Fill #0

## 2019-02-26 MED FILL — ?METOPROLOL 100 MG TABLET: 100 | 90 days supply | Qty: 180 | Fill #0

## 2019-02-26 MED FILL — ?PANTOPRAZOLE SOD DR 40MG: 40 MG | 90 days supply | Qty: 90 | Fill #0

## 2019-02-26 MED FILL — ?GLIMEPIRIDE 4 MG TABLET: 4 | 90 days supply | Qty: 180 | Fill #0

## 2019-03-12 MED FILL — METOPROLOL TARTRATE 100 MG: 100 | 90 days supply | Qty: 180 | Fill #3

## 2019-04-05 MED FILL — metFORMIN HCL 500 MG TABS: 500 | 30 days supply | Qty: 120 | Fill #0

## 2019-04-05 MED FILL — ATORVASTATIN 80 MG TABLET: 80 | 30 days supply | Qty: 30 | Fill #0

## 2019-04-05 MED FILL — CLOPIDOGREL 75 MG TABLET: 75 | 30 days supply | Qty: 30 | Fill #0

## 2019-04-05 MED FILL — FUROSEMIDE 20 MG TABS: 20 | 30 days supply | Qty: 60 | Fill #0

## 2019-04-05 MED FILL — ISOSORBIDE MN ER 30 MG TAB: 30 | 30 days supply | Qty: 60 | Fill #5

## 2019-05-11 MED FILL — metFORMIN HCL 500 MG TABS: 500 | 30 days supply | Qty: 120 | Fill #1

## 2019-05-11 MED FILL — CLOPIDOGREL 75 MG TABLET: 75 | 30 days supply | Qty: 30 | Fill #1

## 2019-05-11 MED FILL — FUROSEMIDE 20 MG TABS: 20 | 30 days supply | Qty: 60 | Fill #1

## 2019-05-11 MED FILL — ATORVASTATIN 80 MG TABLET: 80 | 30 days supply | Qty: 30 | Fill #1

## 2019-05-11 MED FILL — ISOSORBIDE MN ER 30 MG TAB: 30 | 30 days supply | Qty: 60 | Fill #6

## 2019-06-07 MED FILL — CLOPIDOGREL 75 MG TABLET: 75 | 30 days supply | Qty: 30 | Fill #2

## 2019-06-07 MED FILL — metFORMIN HCL 500 MG TABS: 500 | 30 days supply | Qty: 120 | Fill #2

## 2019-06-07 MED FILL — FUROSEMIDE 20 MG TABS: 20 | 30 days supply | Qty: 60 | Fill #2

## 2019-06-07 MED FILL — ATORVASTATIN 80 MG TABLET: 80 | 30 days supply | Qty: 30 | Fill #2

## 2019-06-09 MED FILL — ISOSORBIDE MN ER 30 MG TAB: 30 | 30 days supply | Qty: 60 | Fill #0

## 2019-06-18 ENCOUNTER — Observation Stay (HOSPITAL_COMMUNITY)
Admission: EM | Admit: 2019-06-18 | Discharge: 2019-06-20 | Disposition: A | Discharge status: Home / Self Care | Payer: Self-pay | Attending: Internal Medicine | Admitting: Internal Medicine

## 2019-06-18 ENCOUNTER — Other Ambulatory Visit: Payer: Self-pay

## 2019-06-18 DIAGNOSIS — I5032 Chronic diastolic (congestive) heart failure: Secondary | ICD-10-CM | POA: Insufficient documentation

## 2019-06-18 DIAGNOSIS — E119 Type 2 diabetes mellitus without complications: Secondary | ICD-10-CM | POA: Insufficient documentation

## 2019-06-18 DIAGNOSIS — I2510 Atherosclerotic heart disease of native coronary artery without angina pectoris: Secondary | ICD-10-CM

## 2019-06-18 DIAGNOSIS — I10 Essential (primary) hypertension: Secondary | ICD-10-CM | POA: Diagnosis present

## 2019-06-18 DIAGNOSIS — Z79899 Other long term (current) drug therapy: Secondary | ICD-10-CM | POA: Insufficient documentation

## 2019-06-18 DIAGNOSIS — Z7982 Long term (current) use of aspirin: Secondary | ICD-10-CM | POA: Insufficient documentation

## 2019-06-18 DIAGNOSIS — Z7984 Long term (current) use of oral hypoglycemic drugs: Secondary | ICD-10-CM | POA: Insufficient documentation

## 2019-06-18 DIAGNOSIS — Z7902 Long term (current) use of antithrombotics/antiplatelets: Secondary | ICD-10-CM | POA: Insufficient documentation

## 2019-06-18 DIAGNOSIS — I25119 Atherosclerotic heart disease of native coronary artery with unspecified angina pectoris: Principal | ICD-10-CM | POA: Insufficient documentation

## 2019-06-18 DIAGNOSIS — R430 Anosmia: Secondary | ICD-10-CM | POA: Insufficient documentation

## 2019-06-18 DIAGNOSIS — Z87891 Personal history of nicotine dependence: Secondary | ICD-10-CM | POA: Insufficient documentation

## 2019-06-18 DIAGNOSIS — I110 Hypertensive heart disease with heart failure: Secondary | ICD-10-CM | POA: Insufficient documentation

## 2019-06-18 DIAGNOSIS — N179 Acute kidney failure, unspecified: Secondary | ICD-10-CM | POA: Insufficient documentation

## 2019-06-18 DIAGNOSIS — A084 Viral intestinal infection, unspecified: Secondary | ICD-10-CM | POA: Insufficient documentation

## 2019-06-18 DIAGNOSIS — I209 Angina pectoris, unspecified: Secondary | ICD-10-CM

## 2019-06-18 DIAGNOSIS — R079 Chest pain, unspecified: Secondary | ICD-10-CM

## 2019-06-18 DIAGNOSIS — Z20828 Contact with and (suspected) exposure to other viral communicable diseases: Secondary | ICD-10-CM | POA: Insufficient documentation

## 2019-06-18 DIAGNOSIS — I252 Old myocardial infarction: Secondary | ICD-10-CM | POA: Insufficient documentation

## 2019-06-19 ENCOUNTER — Other Ambulatory Visit: Payer: Self-pay

## 2019-06-19 ENCOUNTER — Observation Stay (HOSPITAL_BASED_OUTPATIENT_CLINIC_OR_DEPARTMENT_OTHER): Payer: Self-pay

## 2019-06-19 ENCOUNTER — Encounter (HOSPITAL_COMMUNITY): Payer: Self-pay | Admitting: *Deleted

## 2019-06-19 ENCOUNTER — Emergency Department (HOSPITAL_COMMUNITY): Payer: Self-pay

## 2019-06-19 ENCOUNTER — Observation Stay (HOSPITAL_COMMUNITY): Payer: Self-pay

## 2019-06-19 DIAGNOSIS — I361 Nonrheumatic tricuspid (valve) insufficiency: Secondary | ICD-10-CM

## 2019-06-19 DIAGNOSIS — N179 Acute kidney failure, unspecified: Secondary | ICD-10-CM

## 2019-06-19 DIAGNOSIS — A084 Viral intestinal infection, unspecified: Secondary | ICD-10-CM

## 2019-06-19 DIAGNOSIS — I209 Angina pectoris, unspecified: Secondary | ICD-10-CM

## 2019-06-19 LAB — CBC
HCT: 39 % (ref 36.0–46.0)
Hemoglobin: 13 g/dL (ref 12.0–15.0)
MCH: 30.7 pg (ref 26.0–34.0)
MCHC: 33.3 g/dL (ref 30.0–36.0)
MCV: 92 fL (ref 80.0–100.0)
Platelets: 301 10*3/uL (ref 150–400)
RBC: 4.24 MIL/uL (ref 3.87–5.11)
RDW: 13.2 % (ref 11.5–15.5)
WBC: 9.5 10*3/uL (ref 4.0–10.5)
nRBC: 0 % (ref 0.0–0.2)

## 2019-06-19 LAB — BASIC METABOLIC PANEL
Anion gap: 15 (ref 5–15)
BUN: 49 mg/dL — ABNORMAL HIGH (ref 6–20)
CO2: 24 mmol/L (ref 22–32)
Calcium: 9.9 mg/dL (ref 8.9–10.3)
Chloride: 99 mmol/L (ref 98–111)
Creatinine, Ser: 2.84 mg/dL — ABNORMAL HIGH (ref 0.44–1.00)
GFR calc Af Amer: 20 mL/min — ABNORMAL LOW (ref >60)
GFR calc non Af Amer: 17 mL/min — ABNORMAL LOW (ref >60)
Glucose, Bld: 218 mg/dL — ABNORMAL HIGH (ref 70–99)
Potassium: 4.6 mmol/L (ref 3.5–5.1)
Sodium: 138 mmol/L (ref 135–145)

## 2019-06-19 LAB — SODIUM, URINE, RANDOM: Sodium, Ur: 12 mmol/L

## 2019-06-19 LAB — GLUCOSE, CAPILLARY
Glucose-Capillary: 109 mg/dL — ABNORMAL HIGH (ref 70–99)
Glucose-Capillary: 71 mg/dL (ref 70–99)
Glucose-Capillary: 175 mg/dL — ABNORMAL HIGH (ref 70–99)
Glucose-Capillary: 295 mg/dL — ABNORMAL HIGH (ref 70–99)
Glucose-Capillary: 263 mg/dL — ABNORMAL HIGH (ref 70–99)

## 2019-06-19 LAB — TROPONIN I (HIGH SENSITIVITY)
Troponin I (High Sensitivity): 6 ng/L (ref <18)
Troponin I (High Sensitivity): 5 ng/L (ref <18)

## 2019-06-19 LAB — HEPATIC FUNCTION PANEL
ALT: 20 U/L (ref 0–44)
AST: 18 U/L (ref 15–41)
Albumin: 3.4 g/dL — ABNORMAL LOW (ref 3.5–5.0)
Alkaline Phosphatase: 74 U/L (ref 38–126)
Bilirubin, Direct: <0.1 mg/dL (ref 0.0–0.2)
Total Bilirubin: 0.5 mg/dL (ref 0.3–1.2)
Total Protein: 6.5 g/dL (ref 6.5–8.1)

## 2019-06-19 LAB — ECHOCARDIOGRAM COMPLETE
Height: 62 in
Weight: 3408 oz

## 2019-06-19 LAB — SARS CORONAVIRUS 2 BY RT PCR (HOSPITAL ORDER, PERFORMED IN ~~LOC~~ HOSPITAL LAB): SARS Coronavirus 2: NEGATIVE

## 2019-06-19 LAB — I-STAT BETA HCG BLOOD, ED (MC, WL, AP ONLY): I-stat hCG, quantitative: <5 m[IU]/mL (ref <5)

## 2019-06-19 LAB — HEMOGLOBIN A1C
Hgb A1c MFr Bld: 8.5 % — ABNORMAL HIGH (ref 4.8–5.6)
Mean Plasma Glucose: 197.25 mg/dL

## 2019-06-19 LAB — CREATININE, URINE, RANDOM: Creatinine, Urine: 268.44 mg/dL

## 2019-06-19 MED ORDER — LACTATED RINGERS IV SOLN
INTRAVENOUS | Status: DC
  Administered 2019-06-19 – 2019-06-20 (×2): via INTRAVENOUS

## 2019-06-19 MED ORDER — METOPROLOL TARTRATE 50 MG PO TABS
75.0000 mg | ORAL_TABLET | Freq: Two times a day (BID) | ORAL | Status: DC
  Administered 2019-06-19 – 2019-06-20 (×2): 75 mg via ORAL
  Filled 2019-06-19 (×2): qty 1

## 2019-06-19 MED ORDER — INSULIN ASPART 100 UNIT/ML ~~LOC~~ SOLN
0.0000 [IU] | SUBCUTANEOUS | Status: DC
  Administered 2019-06-19: 2 [IU] via SUBCUTANEOUS

## 2019-06-19 MED ORDER — ASPIRIN 81 MG PO CHEW
324.0000 mg | CHEWABLE_TABLET | Freq: Once | ORAL | Status: AC
  Administered 2019-06-19: 324 mg via ORAL
  Filled 2019-06-19: qty 4

## 2019-06-19 MED ORDER — CLOPIDOGREL BISULFATE 75 MG PO TABS
75.0000 mg | ORAL_TABLET | Freq: Every day | ORAL | Status: DC
  Administered 2019-06-19 – 2019-06-20 (×2): 75 mg via ORAL
  Filled 2019-06-19 (×2): qty 1

## 2019-06-19 MED ORDER — ACETAMINOPHEN 325 MG PO TABS: 650.0000 mg | ORAL_TABLET | ORAL | Status: DC | PRN

## 2019-06-19 MED ORDER — ONDANSETRON HCL 4 MG/2ML IJ SOLN: 4.0000 mg | Freq: Four times a day (QID) | INTRAMUSCULAR | Status: DC | PRN

## 2019-06-19 MED ORDER — LOPERAMIDE HCL 2 MG PO CAPS: 2.0000 mg | ORAL_CAPSULE | ORAL | Status: DC | PRN

## 2019-06-19 MED ORDER — SODIUM CHLORIDE 0.9% FLUSH
3.0000 mL | Freq: Once | INTRAVENOUS | Status: AC
  Administered 2019-06-19: 03:00:00 3 mL via INTRAVENOUS

## 2019-06-19 MED ORDER — NITROGLYCERIN 0.4 MG SL SUBL: 0.4000 mg | SUBLINGUAL_TABLET | SUBLINGUAL | Status: DC | PRN

## 2019-06-19 MED ORDER — ATORVASTATIN CALCIUM 80 MG PO TABS
80.0000 mg | ORAL_TABLET | Freq: Every day | ORAL | Status: DC
  Administered 2019-06-19 (×2): 80 mg via ORAL
  Filled 2019-06-19: qty 1

## 2019-06-19 MED ORDER — METOPROLOL TARTRATE 100 MG PO TABS
100.0000 mg | ORAL_TABLET | Freq: Two times a day (BID) | ORAL | Status: DC
  Administered 2019-06-19 (×2): 100 mg via ORAL
  Filled 2019-06-19 (×2): qty 1

## 2019-06-19 MED ORDER — ISOSORBIDE MONONITRATE ER 60 MG PO TB24
60.0000 mg | ORAL_TABLET | Freq: Every day | ORAL | Status: DC
  Administered 2019-06-19 – 2019-06-20 (×2): 60 mg via ORAL
  Filled 2019-06-19 (×2): qty 1

## 2019-06-19 MED ORDER — ASPIRIN EC 81 MG PO TBEC
81.0000 mg | DELAYED_RELEASE_TABLET | Freq: Every day | ORAL | Status: DC
  Administered 2019-06-19 – 2019-06-20 (×2): 81 mg via ORAL
  Filled 2019-06-19 (×2): qty 1

## 2019-06-19 MED ORDER — PANTOPRAZOLE SODIUM 40 MG PO TBEC
40.0000 mg | DELAYED_RELEASE_TABLET | Freq: Every day | ORAL | Status: DC
  Administered 2019-06-19 – 2019-06-20 (×2): 40 mg via ORAL
  Filled 2019-06-19 (×2): qty 1

## 2019-06-19 MED ORDER — HEPARIN SODIUM (PORCINE) 5000 UNIT/ML IJ SOLN
5000.0000 [IU] | Freq: Three times a day (TID) | INTRAMUSCULAR | Status: DC
  Administered 2019-06-19 – 2019-06-20 (×4): 5000 [IU] via SUBCUTANEOUS
  Filled 2019-06-19 (×4): qty 1

## 2019-06-19 MED ORDER — INSULIN ASPART 100 UNIT/ML ~~LOC~~ SOLN
0.0000 [IU] | Freq: Every day | SUBCUTANEOUS | Status: DC
  Administered 2019-06-19: 3 [IU] via SUBCUTANEOUS

## 2019-06-19 MED ORDER — SODIUM CHLORIDE 0.9 % IV SOLN
INTRAVENOUS | Status: DC
  Administered 2019-06-19 (×2): via INTRAVENOUS

## 2019-06-19 MED ORDER — INSULIN ASPART 100 UNIT/ML ~~LOC~~ SOLN
0.0000 [IU] | Freq: Three times a day (TID) | SUBCUTANEOUS | Status: DC
  Administered 2019-06-19: 5 [IU] via SUBCUTANEOUS
  Administered 2019-06-20: 3 [IU] via SUBCUTANEOUS

## 2019-06-19 NOTE — Progress Notes (Signed)
  Echocardiogram 2D Echocardiogram has been performed.  [name] 06/19/2019, 10:44 AM

## 2019-06-19 NOTE — Plan of Care (Signed)
  Problem: Pain Managment: Goal: General experience of comfort will improve Outcome: Completed/Met   Problem: Safety: Goal: Ability to remain free from injury will improve Outcome: Completed/Met

## 2019-06-19 NOTE — ED Provider Notes (Signed)
[facility] EMERGENCY DEPARTMENT Provider Note   CSN: [id number] Arrival date & time: 06/18/19  2323    History   Chief Complaint Chief Complaint  Patient presents with  . Chest Pain    HPI [name] is a 63 y.o. female.   The history is provided by the patient.  Chest Pain She has history of hypertension, diabetes, hyperlipidemia, coronary artery disease status post stent placement, chronic diastolic heart failure and comes in because of chest discomfort for the last 4 days.  She describes an intermittent heavy feeling in her lower chest with some associated dyspnea, nausea, diaphoresis.  She has vomited on a few occasions.  This is similar to what she had prior to her stent placement.  Symptoms are not worse with exertion, but they are better if she lays still in bed.  She has had some nocturnal symptoms which have responded to taking some Pepto-Bismol.  She does have a chronic cough which she blames on smoking, and this has not changed.  She has noted loss of sense of smell and taste over the last several days.  She denies fever, chills, sweats.  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  Past Medical History:  Diagnosis Date  . Anginal pain ([place])   . Coronary atherosclerosis of native coronary artery, with stent to LCX in 2006 06/04/2013  . Diabetes mellitus without complication ([place])   . GERD (gastroesophageal reflux disease)   . Heart attack ([place])   . Hyperlipidemia LDL goal < 70 06/07/2013  . Hypertension   . S/P coronary artery stent placement to RCA with PROMUS DES, 06/07/13, residual disease on OM1, OM2 and rPDA 06/07/2013    Patient Active Problem List   Diagnosis Date Noted  . Coronary artery disease involving native coronary artery of native heart with angina pectoris ([place])   . Chest pain 07/18/2018  . Osteoarthritis of both hands 05/01/2016  . Chronic diastolic CHF  (congestive heart failure) ([place]) 12/06/2015  . Non-compliance with treatment-(cost) 08/24/2015  . Hypertensive cardiovascular disease 08/24/2015  . Dyslipidemia 08/24/2015  . CAD -S/P RCA DES 08/22/15   . NSTEMI (non-ST elevated myocardial infarction) ([place]) 08/18/2015  . Obesity 06/15/2013  . Type 2 diabetes mellitus, uncontrolled ([place]) 06/04/2013  . Essential hypertension 06/04/2013    Past Surgical History:  Procedure Laterality Date  . CARDIAC CATHETERIZATION N/A 08/22/2015   Procedure: Left Heart Cath and Coronary Angiography;  Surgeon: Leonie Man, MD;  Location: [facility];  Service: Cardiovascular;  Laterality: N/A;  . CARDIAC CATHETERIZATION N/A 08/22/2015   Procedure: Coronary Stent Intervention;  Surgeon: Leonie Man, MD;  Location: [facility];  Service: Cardiovascular;  Laterality: N/A;  . CARDIAC CATHETERIZATION N/A 08/22/2015   Procedure: Left Heart Cath and Coronary Angiography;  Surgeon: Leonie Man, MD;  Location: [facility];  Service: Cardiovascular;  Laterality: N/A;  . CORONARY STENT PLACEMENT  2006   [facility]X in [facility]  . LEFT HEART CATH AND CORONARY ANGIOGRAPHY N/A 07/21/2018   Procedure: LEFT HEART CATH AND CORONARY ANGIOGRAPHY;  Surgeon: Troy Sine, MD;  Location: [facility];  Service: Cardiovascular;  Laterality: N/A;  . LEFT HEART CATHETERIZATION WITH CORONARY ANGIOGRAM N/A 06/07/2013   Procedure: LEFT HEART CATHETERIZATION WITH CORONARY ANGIOGRAM;  Surgeon: Sanda Klein, MD;  Location: [facility];  Service: Cardiovascular;  Laterality: N/A;     OB History   No obstetric history on file.      Home Medications    Prior to Admission medications   Medication Sig Start Date End Date Taking? Authorizing Provider  acetaminophen (TYLENOL) 500 MG tablet Take 1,000 mg by mouth daily as needed for moderate pain.     [provider]  aspirin EC 81 MG EC tablet Take 1 tablet (81 mg total) by mouth daily.  08/24/15   Erlene Quan, PA-C  atorvastatin (LIPITOR) 80 MG tablet TAKE 1 TABLET BY MOUTH DAILY AT 6 PM 02/25/19   [name], MD  clopidogrel (PLAVIX) 75 MG tablet Take 1 tablet (75 mg total) by mouth daily with breakfast. 02/25/19   [name], MD  furosemide (LASIX) 20 MG tablet Take 2 tablets (40 mg total) by mouth daily. 02/25/19   [name], MD  glimepiride (AMARYL) 4 MG tablet TAKE 1 TABLET BY MOUTH 2 TIMES DAILY WITH A MEAL. 02/25/19   [name], MD  Insulin Pen Needle (TRUEPLUS PEN NEEDLES) 32G X 4 MM MISC Use as directed to inject victoza once daily 08/20/18   [name], MD  isosorbide mononitrate (IMDUR) 30 MG 24 hr tablet Take 2 tablets (60 mg total) by mouth daily. 02/25/19   [name], MD  liraglutide (VICTOZA) 18 MG/3ML SOPN Inject 0.3 mLs (1.8 mg total) into the skin daily with breakfast. Start with 0.1 mL daily for 1 week then 0.2 mL for 1 week then 0.3 mL 02/25/19   [name], MD  lisinopril-hydrochlorothiazide (ZESTORETIC) 20-12.5 MG tablet Take 2 tablets by mouth daily. 02/25/19   [name], MD  metFORMIN (GLUCOPHAGE) 500 MG tablet Take 2 tablets (1,000 mg total) by mouth 2 (two) times daily with a meal. 02/25/19   [name], MD  metoprolol tartrate (LOPRESSOR) 100 MG tablet Take 1 tablet (100 mg total) by mouth 2 (two) times daily. 02/25/19   [name], MD  nitroGLYCERIN (NITROSTAT) 0.4 MG SL tablet Place 1 tablet (0.4 mg total) under the tongue every 5 (five) minutes as needed for chest pain. 08/24/15   Erlene Quan, PA-C  pantoprazole (PROTONIX) 40 MG tablet Take 1 tablet (40 mg total) by mouth daily. 02/25/19   [name], MD  ranitidine (ZANTAC) 150 MG capsule Take 150 mg by mouth daily.     [provider]    Family History Family History  Problem Relation Age of Onset  . Diabetes Mother   . Hypertension Mother   . Diabetes Sister   . Diabetes Brother   . Hypertension Brother     Social History Social  History   Tobacco Use  . Smoking status: Former Smoker    Years: 25.00    Quit date: 07/03/2004    Years since quitting: 14.9  . Smokeless tobacco: Never Used  Substance Use Topics  . Alcohol use: Yes    Comment: occ  . Drug use: No     Allergies   Patient has no known allergies.   Review of Systems Review of Systems  Cardiovascular: Positive for chest pain.  All other systems reviewed and are negative.    Physical Exam Updated Vital Signs BP 118/61   Pulse 67   Temp 98.7 F (37.1 C) (Oral)   Resp 19   Ht 5\' 2"  (1.575 m)   Wt 102.1 kg   SpO2 100%   BMI 41.15 kg/m   Physical Exam Vitals signs and nursing note reviewed.  63 year old female, resting comfortably and in no acute distress. Vital signs are normal. Oxygen saturation is 100%, which is normal. Head is normocephalic and atraumatic. PERRLA, EOMI. Oropharynx is clear. Neck is nontender and supple without adenopathy or JVD. Back is nontender and there is no CVA tenderness. Lungs are clear without rales, wheezes, or rhonchi. Chest is nontender. Heart has regular rate and rhythm without murmur. Abdomen is soft, flat, nontender without masses or hepatosplenomegaly and peristalsis is normoactive. Extremities have no cyanosis or edema, full range of motion is present. Skin is warm and dry without rash. Neurologic: Mental status is normal, cranial nerves are intact, there are no motor or sensory deficits.  ED Treatments / Results  Labs (all labs ordered are listed, but only abnormal results are displayed) Labs Reviewed  BASIC METABOLIC PANEL - Abnormal; Notable for the following components:      Result Value   Glucose, Bld 218 (*)    BUN 49 (*)    Creatinine, Ser 2.84 (*)    GFR calc non Af Amer 17 (*)    GFR calc Af Amer 20 (*)    All other components within normal limits  CBC  TROPONIN I (HIGH SENSITIVITY)  TROPONIN I (HIGH SENSITIVITY)  I-STAT BETA HCG BLOOD, ED ([facility], [facility], [facility] ONLY)    EKG EKG  Interpretation  Date/Time:  Friday June 18 2019 23:55:18 EDT Ventricular Rate:  71 PR Interval:  130 QRS Duration: 82 QT Interval:  424 QTC Calculation: 460 R Axis:   83 Text Interpretation:  Normal sinus rhythm Normal ECG When compared with ECG of 07/19/2018, No significant change was found Confirmed by Delora Fuel ([id number]) on 06/19/2019 12:26:07 AM   Radiology Dg Chest Portable 1 View  Result Date: 06/19/2019 CLINICAL DATA:  Chest pain EXAM: PORTABLE CHEST 1 VIEW COMPARISON:  05/18/2018 FINDINGS: No focal opacity or pleural effusion. Normal cardiomediastinal silhouette with aortic atherosclerosis. No pneumothorax. Mild chronic bronchitic changes. IMPRESSION: No active disease. Electronically Signed   By: Donavan Foil M.D.   On: 06/19/2019 00:59    Procedures Procedures   Medications Ordered in ED Medications  sodium chloride flush (NS) 0.9 % injection 3 mL (has no administration in time range)     Initial Impression / Assessment and Plan / ED Course  I have reviewed the triage vital signs and the nursing notes.  Pertinent labs & imaging results that were available during my care of the patient were reviewed by me and considered in my medical decision making (see chart for details).  Chest discomfort worrisome for recurrent angina.  Loss of sense of smell and taste worrisome for COVID-19 infection.  ECG shows no acute changes.  Chest x-ray shows no acute disease.  Initial troponin is normal, repeat pending.  Metabolic panel shows evidence of acute kidney injury.  Creatinine is 2.84, with most recent stent creatinine on record 0.77 on February 25, 2019.  She is on lisinopril, and that is the only nephrotoxic agent that she is on that I can see.  Old records are reviewed, and left heart catheterization July 21, 2018 showed widely patent stents, but she did have significant distal LAD stenosis in the 65-70% range, as well as a first marginal branch of the circumflex with an 80% stenosis.  She  will need to be admitted for further evaluation of her chest pain, evaluation for possible COVID-19, and evaluation and management of acute kidney injury.  Case is discussed with Dr. Marlowe Sax of [facility], who  agrees to admit the patient.  Final Clinical Impressions(s) / ED Diagnoses   Final diagnoses:  Nonspecific chest pain  Acute kidney injury (nontraumatic) [facility])  Anosmia    ED Discharge Orders    None       Delora Fuel, MD 35/32/99 [phone]

## 2019-06-19 NOTE — ED Triage Notes (Signed)
Pt reports L sided chest pain, bodyaches, stomach upset, and sob for the past few days. Denies being exposed to [place], however works at a [employer]

## 2019-06-19 NOTE — Progress Notes (Signed)
[place] OF CARE NOTE Patient: [name] GEZ:[id number]   PCP: [name], MD DOB: 07-29-1956   DOA: 06/18/2019   DOS: 06/19/2019    Patient was admitted by my colleague Dr. Marlowe Sax earlier on 06/19/2019. I have reviewed the H&P as well as assessment and plan and agree with the same. Important changes in the plan are listed below.  Plan of care: Principal Problem:   Angina pectoris [facility]) Active Problems:   Essential hypertension   CAD -S/P RCA DES 08/22/15   Viral gastroenteritis   AKI (acute kidney injury) ([place]) Atypical chest pain. High sensitive troponin VI and V.  Delta troponin less than 5. Patient does not have any further chest pain right now. EKG unremarkable. Do not think that the patient actually has ACS or UA. Had a positive stress test in August 2019 which led to a cardiac catheterization which showed distal stenosis of OM1 and recommendation was medical management. At present I do not think that this patient requires further inpatient work-up. We will recommend outpatient cardiology follow-up in 2 weeks. Continue aspirin and Plavix.  Viral gastroenteritis. AKI. Likely prerenal in etiology. Continue IV fluid.  Reduce the rate. Normal ultrasound renal. No further vomiting no further diarrhea here in the hospital. No abdominal pain on my examination. Advance diet. Renal function improves tomorrow likely can go home tomorrow.  Author: Berle Mull, MD [facility] 06/19/2019 5:01 PM   If 7PM-7AM, please contact night-coverage at [email or web address]

## 2019-06-19 NOTE — H&P (Signed)
History and Physical    [name] PTW:[id number] DOB: 01-14-1956 DOA: 06/18/2019  PCP: [name], MD Patient coming from: Home  Chief Complaint: Chest pain  HPI: [name] is a 63 y.o. female with medical history significant of CAD status post PCI, diastolic congestive heart failure, hypertension, hyperlipidemia, GERD presenting to the hospital for evaluation of chest pain.  Patient states 4 days ago she experienced left-sided chest pressure while doing household chores.  It was associated with dyspnea, nausea, and diaphoresis.  Chest pain resolved in a few minutes after she rested.  This has continued to happen several times for the past 4 days and each time symptoms occur with exertion and resolve in a few minutes with rest.  Reports compliance with home aspirin and Plavix.  Also reports having nonbloody nonbilious emesis and nonbloody diarrhea for the past few days.  No abdominal pain.  No recent sick contacts.  She thinks she may have had a fever at home but did not check her temperature.  No other complaints.  ED Course: Hemodynamically stable.  Afebrile and no leukocytosis.  BUN 49.  Creatinine 2.8, baseline 0.7.  Initial high-sensitivity troponin negative.  EKG without acute ischemic changes.  COVID-19 rapid test pending.  Chest x-ray personally reviewed showing mild chronic bronchitic changes, no active disease. Patient received aspirin 324 mg in the ED.  Review of Systems:  All systems reviewed and apart from history of presenting illness, are negative.  Past Medical History:  Diagnosis Date  . Anginal pain ([place])   . Coronary atherosclerosis of native coronary artery, with stent to LCX in 2006 06/04/2013  . Diabetes mellitus without complication ([place])   . GERD (gastroesophageal reflux disease)   . Heart attack ([place])   . Hyperlipidemia LDL goal < 70 06/07/2013  . Hypertension   . S/P coronary artery stent placement to RCA with PROMUS DES, 06/07/13, residual disease on OM1,  OM2 and rPDA 06/07/2013    Past Surgical History:  Procedure Laterality Date  . CARDIAC CATHETERIZATION N/A 08/22/2015   Procedure: Left Heart Cath and Coronary Angiography;  Surgeon: Leonie Man, MD;  Location: [place] CV LAB;  Service: Cardiovascular;  Laterality: N/A;  . CARDIAC CATHETERIZATION N/A 08/22/2015   Procedure: Coronary Stent Intervention;  Surgeon: Leonie Man, MD;  Location: [place] CV LAB;  Service: Cardiovascular;  Laterality: N/A;  . CARDIAC CATHETERIZATION N/A 08/22/2015   Procedure: Left Heart Cath and Coronary Angiography;  Surgeon: Leonie Man, MD;  Location: [place] CV LAB;  Service: Cardiovascular;  Laterality: N/A;  . CORONARY STENT PLACEMENT  2006   TAXUS stent [facility]X in [facility]  . LEFT HEART CATH AND CORONARY ANGIOGRAPHY N/A 07/21/2018   Procedure: LEFT HEART CATH AND CORONARY ANGIOGRAPHY;  Surgeon: Troy Sine, MD;  Location: [place] CV LAB;  Service: Cardiovascular;  Laterality: N/A;  . LEFT HEART CATHETERIZATION WITH CORONARY ANGIOGRAM N/A 06/07/2013   Procedure: LEFT HEART CATHETERIZATION WITH CORONARY ANGIOGRAM;  Surgeon: Sanda Klein, MD;  Location: [place] CATH LAB;  Service: Cardiovascular;  Laterality: N/A;     reports that she quit smoking about 14 years ago. She quit after 25.00 years of use. She has never used smokeless tobacco. She reports current alcohol use. She reports that she does not use drugs.  No Known Allergies  Family History  Problem Relation Age of Onset  . Diabetes Mother   . Hypertension Mother   . Diabetes Sister   . Diabetes Brother   .  Hypertension Brother     Prior to Admission medications   Medication Sig Start Date End Date Taking? Authorizing Provider  acetaminophen (TYLENOL) 500 MG tablet Take 1,000 mg by mouth daily as needed for moderate pain.     [provider]  aspirin EC 81 MG EC tablet Take 1 tablet (81 mg total) by mouth daily. 08/24/15   Erlene Quan, PA-C  atorvastatin (LIPITOR) 80  MG tablet TAKE 1 TABLET BY MOUTH DAILY AT 6 PM 02/25/19   [name], MD  clopidogrel (PLAVIX) 75 MG tablet Take 1 tablet (75 mg total) by mouth daily with breakfast. 02/25/19   [name], MD  furosemide (LASIX) 20 MG tablet Take 2 tablets (40 mg total) by mouth daily. 02/25/19   [name], MD  glimepiride (AMARYL) 4 MG tablet TAKE 1 TABLET BY MOUTH 2 TIMES DAILY WITH A MEAL. 02/25/19   [name], MD  Insulin Pen Needle (TRUEPLUS PEN NEEDLES) 32G X 4 MM MISC Use as directed to inject victoza once daily 08/20/18   [name], MD  isosorbide mononitrate (IMDUR) 30 MG 24 hr tablet Take 2 tablets (60 mg total) by mouth daily. 02/25/19   [name], MD  liraglutide (VICTOZA) 18 MG/3ML SOPN Inject 0.3 mLs (1.8 mg total) into the skin daily with breakfast. Start with 0.1 mL daily for 1 week then 0.2 mL for 1 week then 0.3 mL 02/25/19   [name], MD  lisinopril-hydrochlorothiazide (ZESTORETIC) 20-12.5 MG tablet Take 2 tablets by mouth daily. 02/25/19   [name], MD  metFORMIN (GLUCOPHAGE) 500 MG tablet Take 2 tablets (1,000 mg total) by mouth 2 (two) times daily with a meal. 02/25/19   [name], MD  metoprolol tartrate (LOPRESSOR) 100 MG tablet Take 1 tablet (100 mg total) by mouth 2 (two) times daily. 02/25/19   [name], MD  nitroGLYCERIN (NITROSTAT) 0.4 MG SL tablet Place 1 tablet (0.4 mg total) under the tongue every 5 (five) minutes as needed for chest pain. 08/24/15   Erlene Quan, PA-C  pantoprazole (PROTONIX) 40 MG tablet Take 1 tablet (40 mg total) by mouth daily. 02/25/19   [name], MD  ranitidine (ZANTAC) 150 MG capsule Take 150 mg by mouth daily.     [provider]    Physical Exam: Vitals:   06/19/19 0215 06/19/19 0236 06/19/19 0337 06/19/19 0341  BP: 130/70   (!) 146/78  Pulse: (!) 57 (!) 59 62 61  Resp: 16  20   Temp:   98 F (36.7 C)   TempSrc:   Oral   SpO2: 97% 97% 100%   Weight:    96.6 kg  Height:    5\' 2"  (1.575  m)    Physical Exam  Constitutional: She is oriented to person, place, and time. She appears well-developed and well-nourished. No distress.  Resting comfortably watching television  HENT:  Head: Normocephalic.  Dry mucous membranes  Eyes: Right eye exhibits no discharge. Left eye exhibits no discharge.  Neck: Neck supple.  Cardiovascular: Normal rate, regular rhythm and intact distal pulses.  Pulmonary/Chest: Effort normal and breath sounds normal. No respiratory distress. She has no wheezes. She has no rales.  Abdominal: Soft. Bowel sounds are normal. She exhibits no distension. There is no abdominal tenderness. There is no rebound and no guarding.  Musculoskeletal:        General: No edema.  Neurological: She is alert and oriented to person, place, and time.  Skin: Skin is warm and dry.  She is not diaphoretic.     Labs on Admission: I have personally reviewed following labs and imaging studies  CBC: Recent Labs  Lab 06/19/19 0024  WBC 9.5  HGB 13.0  HCT 39.0  MCV 92.0  PLT 196   Basic Metabolic Panel: Recent Labs  Lab 06/19/19 0024  NA 138  K 4.6  CL 99  CO2 24  GLUCOSE 218*  BUN 49*  CREATININE 2.84*  CALCIUM 9.9   GFR: Estimated Creatinine Clearance: 22.8 mL/min (A) (by C-G formula based on SCr of 2.84 mg/dL (H)). Liver Function Tests: No results for input(s): AST, ALT, ALKPHOS, BILITOT, PROT, ALBUMIN in the last 168 hours. No results for input(s): LIPASE, AMYLASE in the last 168 hours. No results for input(s): AMMONIA in the last 168 hours. Coagulation Profile: No results for input(s): INR, PROTIME in the last 168 hours. Cardiac Enzymes: No results for input(s): CKTOTAL, CKMB, CKMBINDEX, TROPONINI in the last 168 hours. BNP (last 3 results) No results for input(s): PROBNP in the last 8760 hours. HbA1C: Recent Labs    06/19/19 0504  HGBA1C 8.5*   CBG: Recent Labs  Lab 06/19/19 0414  GLUCAP 109*   Lipid Profile: No results for input(s): CHOL,  HDL, LDLCALC, TRIG, CHOLHDL, LDLDIRECT in the last 72 hours. Thyroid Function Tests: No results for input(s): TSH, T4TOTAL, FREET4, T3FREE, THYROIDAB in the last 72 hours. Anemia Panel: No results for input(s): VITAMINB12, FOLATE, FERRITIN, TIBC, IRON, RETICCTPCT in the last 72 hours. Urine analysis:    Component Value Date/Time   COLORURINE YELLOW 07/29/2015 1900   APPEARANCEUR CLEAR 07/29/2015 1900   LABSPEC 1.030 07/29/2015 1900   PHURINE 6.5 07/29/2015 1900   GLUCOSEU >1000 (A) 07/29/2015 1900   HGBUR NEGATIVE 07/29/2015 1900   BILIRUBINUR NEGATIVE 07/29/2015 1900   KETONESUR NEGATIVE 07/29/2015 1900   PROTEINUR NEGATIVE 07/29/2015 1900   UROBILINOGEN 0.2 07/29/2015 1900   NITRITE NEGATIVE 07/29/2015 1900   LEUKOCYTESUR NEGATIVE 07/29/2015 1900    Radiological Exams on Admission: US Renal  Result Date: 06/19/2019 CLINICAL DATA:  Acute kidney injury. EXAM: RENAL / URINARY TRACT ULTRASOUND COMPLETE COMPARISON:  Abdominal ultrasound dated August 18, 2015. FINDINGS: Right Kidney: Renal measurements: 11.4 x 5.4 x 6.1 cm = volume: 196 mL . Echogenicity within normal limits. No mass or hydronephrosis visualized. Left Kidney: Renal measurements: 11.2 x 4.8 x 4.9 cm = volume: 134 mL. Echogenicity within normal limits. No mass or hydronephrosis visualized. Bladder: Decompressed. IMPRESSION: Normal renal ultrasound. Electronically Signed   By: Titus Dubin M.D.   On: 06/19/2019 05:47   Dg Chest Portable 1 View  Result Date: 06/19/2019 CLINICAL DATA:  Chest pain EXAM: PORTABLE CHEST 1 VIEW COMPARISON:  05/18/2018 FINDINGS: No focal opacity or pleural effusion. Normal cardiomediastinal silhouette with aortic atherosclerosis. No pneumothorax. Mild chronic bronchitic changes. IMPRESSION: No active disease. Electronically Signed   By: Donavan Foil M.D.   On: 06/19/2019 00:59    EKG: Independently reviewed.  Sinus rhythm, no acute ischemic changes.  Assessment/Plan Principal Problem:    Angina pectoris ([place]) Active Problems:   Essential hypertension   CAD -S/P RCA DES 08/22/15   Viral gastroenteritis   AKI (acute kidney injury) ([place])   Angina, history of CAD status post PCI Currently hemodynamically stable and chest pain-free. High-sensitivity troponin checked twice negative.  EKG without acute ischemic changes. Left heart cath done in August 2019 revealed widely patent stent in the proximal LAD but did have significant distal LAD stenosis in the 65 to 70% range,  first marginal branch of the circumflex with 80% stenosis, and total occlusion of the distal aspect of the circumflex stent.  Based on the patient's history, there is concern for stent occlusion and/or progression of existing stenosis. -Cardiac monitoring -Received full dose aspirin in the ED -Continue home aspirin 81 mg daily and Plavix 75 mg daily -Continue home high intensity statin -Continue home Imdur, metoprolol -EKG PRN recurrence of chest pain -Sublingual nitroglycerin as needed -Echocardiogram -Keep n.p.o. at this time.  Consult cardiology in the morning, may need repeat catheterization.  Viral gastroenteritis Afebrile and no leukocytosis.  No complaints of abdominal pain and exam benign.  COVID-19 rapid test negative. -Check LFTs -IV fluid hydration -IV Zofran PRN -Loperamide as needed  AKI Likely prerenal due to dehydration from vomiting/ diarrhea and concomitant home ACE inhibitor and diuretic use (takes lisinopril-hydrochlorothiazide). BUN 49.  Creatinine 2.8, baseline 0.7.  Renal ultrasound normal, no evidence of obstructive uropathy. -IV fluid hydration -Avoid nephrotoxic agents/contrast -Continue to monitor renal function -Monitor urine output -Check urine sodium, creatinine  Uncontrolled non-insulin-dependent type 2 diabetes A1c 8.5. -Sliding scale insulin sensitive and CBG checks  Diastolic congestive heart failure -Currently hypovolemic.  Hold diuretic.  Hypertension -Continue home  Imdur, metoprolol  DVT prophylaxis: Subcutaneous heparin Code Status: Full code Family Communication: No family available at this time. Disposition Plan: Anticipate discharge after clinical improvement. Consults called: None Admission status: It is my clinical opinion that referral for OBSERVATION is reasonable and necessary in this patient based on the above information provided. The aforementioned taken together are felt to place the patient at high risk for further clinical deterioration. However it is anticipated that the patient may be medically stable for discharge from the hospital within 24 to 48 hours.  The medical decision making on this patient was of high complexity and the patient is at high risk for clinical deterioration, therefore this is a level 3 visit.  Shela Leff MD [facility] Pager [phone]  If 7PM-7AM, please contact night-coverage [email or web address] Password [email or web address]  06/19/2019, 6:06 AM

## 2019-06-20 LAB — GLUCOSE, CAPILLARY: Glucose-Capillary: 201 mg/dL — ABNORMAL HIGH (ref 70–99)

## 2019-06-20 LAB — BASIC METABOLIC PANEL
Anion gap: 8 (ref 5–15)
BUN: 41 mg/dL — ABNORMAL HIGH (ref 6–20)
CO2: 26 mmol/L (ref 22–32)
Calcium: 8.8 mg/dL — ABNORMAL LOW (ref 8.9–10.3)
Chloride: 105 mmol/L (ref 98–111)
Creatinine, Ser: 1.33 mg/dL — ABNORMAL HIGH (ref 0.44–1.00)
GFR calc Af Amer: 50 mL/min — ABNORMAL LOW (ref >60)
GFR calc non Af Amer: 43 mL/min — ABNORMAL LOW (ref >60)
Glucose, Bld: 281 mg/dL — ABNORMAL HIGH (ref 70–99)
Potassium: 4.2 mmol/L (ref 3.5–5.1)
Sodium: 139 mmol/L (ref 135–145)

## 2019-06-20 MED ORDER — FUROSEMIDE 20 MG PO TABS: 40.0000 mg | ORAL_TABLET | Freq: Every day | ORAL | 1 refills | Status: DC | PRN

## 2019-06-20 NOTE — Progress Notes (Signed)
Pt ambulated 300 ft on RA, steady gait, tolerated well with no complaints. Pt O2 sats 98-100% on RA. MD aware. Pt to bed after walk, call bell within reach.   Lenna Sciara, RN, BSN

## 2019-06-24 ENCOUNTER — Telehealth: Payer: Self-pay | Admitting: Cardiovascular Disease

## 2019-06-24 NOTE — Telephone Encounter (Signed)

## 2019-06-24 NOTE — Discharge Summary (Signed)
[facility] Discharge Summary   Patient: [name] FAO:[id number]   PCP: [name], MD DOB: [birth year]   Date of admission: 06/18/2019   Date of discharge: 06/20/2019     Discharge Diagnoses:   Principal Problem:   Angina pectoris [facility]) Active Problems:   Essential hypertension   CAD -S/P RCA DES 08/22/15   Viral gastroenteritis   AKI (acute kidney injury) ([place])   Admitted From: home Disposition:  Home   Recommendations for Outpatient Follow-up:  1. Please follow up with PCP and cardiology as recommended    Follow-up Information    Croitoru, Mihai, MD Follow up.   Specialty: Cardiology Why: the office should call you Monday for date and time. Contact information: [address] [place] [address] [phone]        [name], MD. Schedule an appointment as soon as possible for a visit in 1 week(s).   Specialty: Family Medicine Contact information: [place] [address] [phone]          Diet recommendation: cardiac diet  Activity: The patient is advised to gradually reintroduce usual activities,as tolerated .  Discharge Condition: good  Code Status: full code  History of present illness: As per the H and P dictated on admission, "[name] is a 63 y.o. female with medical history significant of CAD status post PCI, diastolic congestive heart failure, hypertension, hyperlipidemia, GERD presenting to the hospital for evaluation of chest pain.  Patient states 4 days ago she experienced left-sided chest pressure while doing household chores.  It was associated with dyspnea, nausea, and diaphoresis.  Chest pain resolved in a few minutes after she rested.  This has continued to happen several times for the past 4 days and each time symptoms occur with exertion and resolve in a few minutes with rest.  Reports compliance with home aspirin and Plavix.  Also reports having nonbloody nonbilious emesis and  nonbloody diarrhea for the past few days.  No abdominal pain.  No recent sick contacts.  She thinks she may have had a fever at home but did not check her temperature.  No other complaints."  Hospital Course:  Summary of her active problems in the hospital is as following. Atypical chest pain. High sensitive troponin 5 and 6.  Delta troponin less than 5. Patient does not have any further chest pain right now. EKG unremarkable. Do not think that the patient actually has ACS or UA. Had a positive stress test in August 2019 which led to a cardiac catheterization which showed distal stenosis of OM1 and recommendation was medical management. At present I do not think that this patient requires further inpatient work-up. We will recommend outpatient cardiology follow-up in 2 weeks. Continue aspirin and Plavix.  Viral gastroenteritis. AKI. Likely prerenal in etiology. treated with IV fluid. Normal ultrasound renal. No further vomiting no further diarrhea here in the hospital. No abdominal pain on my examination. Advance diet.  Patient was ambulatory without any assistance. On the day of the discharge the patient's vitals were stable, and no other acute medical condition were reported by patient. the patient was felt safe to be discharge at Home with no therapy needed on discharge.  Consultants: none Procedures: cardiology   DISCHARGE MEDICATION: Allergies as of 06/20/2019   No Known Allergies     Medication List    STOP taking these medications   lisinopril-hydrochlorothiazide 20-12.5 MG tablet Commonly known as: Zestoretic     TAKE these medications  acetaminophen 500 MG tablet Commonly known as: TYLENOL Take 1,000 mg by mouth daily as needed for moderate pain.   aspirin 81 MG EC tablet Take 1 tablet (81 mg total) by mouth daily.   atorvastatin 80 MG tablet Commonly known as: LIPITOR TAKE 1 TABLET BY MOUTH DAILY AT 6 PM What changed:   how much to take  how to take  this  when to take this  additional instructions   clopidogrel 75 MG tablet Commonly known as: PLAVIX Take 1 tablet (75 mg total) by mouth daily with breakfast.   furosemide 20 MG tablet Commonly known as: LASIX Take 2 tablets (40 mg total) by mouth daily as needed for fluid or edema (weight gain 3lbs in 1 day or 5 lbs in 2 days.). What changed:   when to take this  reasons to take this   glimepiride 4 MG tablet Commonly known as: AMARYL TAKE 1 TABLET BY MOUTH 2 TIMES DAILY WITH A MEAL. What changed:   how much to take  how to take this  when to take this  additional instructions   Insulin Pen Needle 32G X 4 MM Misc Commonly known as: TRUEplus Pen Needles Use as directed to inject victoza once daily   isosorbide mononitrate 30 MG 24 hr tablet Commonly known as: IMDUR Take 2 tablets (60 mg total) by mouth daily.   liraglutide 18 MG/3ML Sopn Commonly known as: Victoza Inject 0.3 mLs (1.8 mg total) into the skin daily with breakfast. Start with 0.1 mL daily for 1 week then 0.2 mL for 1 week then 0.3 mL   metFORMIN 500 MG tablet Commonly known as: GLUCOPHAGE Take 2 tablets (1,000 mg total) by mouth 2 (two) times daily with a meal.   metoprolol tartrate 100 MG tablet Commonly known as: LOPRESSOR Take 1 tablet (100 mg total) by mouth 2 (two) times daily.   nitroGLYCERIN 0.4 MG SL tablet Commonly known as: NITROSTAT Place 1 tablet (0.4 mg total) under the tongue every 5 (five) minutes as needed for chest pain.   pantoprazole 40 MG tablet Commonly known as: Protonix Take 1 tablet (40 mg total) by mouth daily.      No Known Allergies Discharge Instructions    Diet - low sodium heart healthy   Complete by: As directed    Discharge instructions   Complete by: As directed    It is important that you read the given instructions as well as go over your medication list with RN to help you understand your care after this hospitalization.  Discharge Instructions:  Please follow-up with PCP in 1-2 weeks  Please request your primary care physician to go over all Hospital Tests and Procedure/Radiological results at the follow up. Please get all Hospital records sent to your PCP by signing hospital release before you go home.   Do not take more than prescribed Pain, Sleep and Anxiety Medications. You were cared for by a hospitalist during your hospital stay. If you have any questions about your discharge medications or the care you received while you were in the hospital after you are discharged, you can call the unit @UNIT @ you were admitted to and ask to speak with the hospitalist on call if the hospitalist that took care of you is not available.  Once you are discharged, your primary care physician will handle any further medical issues. Please note that NO REFILLS for any discharge medications will be authorized once you are discharged, as it is imperative that you  return to your primary care physician (or establish a relationship with a primary care physician if you do not have one) for your aftercare needs so that they can reassess your need for medications and monitor your lab values. You Must read complete instructions/literature along with all the possible adverse reactions/side effects for all the Medicines you take and that have been prescribed to you. Take any new Medicines after you have completely understood and accept all the possible adverse reactions/side effects. Wear Seat belts while driving. If you have smoked or chewed Tobacco in the last 2 yrs please stop smoking and/or stop any Recreational drug use.  If you drink alcohol, please STOP the use and do not drive, operating heavy machinery, perform activities at heights, swimming or participation in water activities or provide baby sitting services under influence.   Increase activity slowly   Complete by: As directed      Discharge Exam: Filed Weights   06/19/19 0059 06/19/19 0341 06/20/19  0520  Weight: 102.1 kg 96.6 kg 99.7 kg   Vitals:   06/19/19 2007 06/20/19 0520  BP: (!) 117/52 (!) 126/48  Pulse: 74 60  Resp: 19 19  Temp: 98.2 F (36.8 C) 97.7 F (36.5 C)  SpO2: 98% 98%   General: Appear in no distress, no Rash; Oral Mucosa Clear, moist. no Abnormal Mass Or lumps Cardiovascular: S1 and S2 Present, no Murmur, Respiratory: normal respiratory effort, Bilateral Air entry present and Clear to Auscultation, no Crackles, no wheezes Abdomen: Bowel Sound present, Soft and no tenderness, no hernia Extremities: no Pedal edema, no calf tenderness Neurology: alert and oriented to time, place, and person affect appropriate. normal without focal findings, mental status, speech normal, alert and oriented x3, PERLA, Motor strength 5/5 and symmetric and sensation grossly normal to light touch   The results of significant diagnostics from this hospitalization (including imaging, microbiology, ancillary and laboratory) are listed below for reference.    Significant Diagnostic Studies: US Renal  Result Date: 06/19/2019 CLINICAL DATA:  Acute kidney injury. EXAM: RENAL / URINARY TRACT ULTRASOUND COMPLETE COMPARISON:  Abdominal ultrasound dated August 18, 2015. FINDINGS: Right Kidney: Renal measurements: 11.4 x 5.4 x 6.1 cm = volume: 196 mL . Echogenicity within normal limits. No mass or hydronephrosis visualized. Left Kidney: Renal measurements: 11.2 x 4.8 x 4.9 cm = volume: 134 mL. Echogenicity within normal limits. No mass or hydronephrosis visualized. Bladder: Decompressed. IMPRESSION: Normal renal ultrasound. Electronically Signed   By: Titus Dubin M.D.   On: 06/19/2019 05:47   Dg Chest Portable 1 View  Result Date: 06/19/2019 CLINICAL DATA:  Chest pain EXAM: PORTABLE CHEST 1 VIEW COMPARISON:  05/18/2018 FINDINGS: No focal opacity or pleural effusion. Normal cardiomediastinal silhouette with aortic atherosclerosis. No pneumothorax. Mild chronic bronchitic changes. IMPRESSION: No  active disease. Electronically Signed   By: Donavan Foil M.D.   On: 06/19/2019 00:59    Microbiology: Recent Results (from the past 240 hour(s))  SARS Coronavirus 2 (CEPHEID - Performed in [place] hospital lab), Hosp Order     Status: None   Collection Time: 06/19/19  1:46 AM   Specimen: Nasopharyngeal Swab  Result Value Ref Range Status   SARS Coronavirus 2 NEGATIVE NEGATIVE Final    Comment: (NOTE) If result is NEGATIVE SARS-CoV-2 target nucleic acids are NOT DETECTED. The SARS-CoV-2 RNA is generally detectable in upper and lower  respiratory specimens during the acute phase of infection. The lowest  concentration of SARS-CoV-2 viral copies this assay can detect is 250  copies / mL. A negative result does not preclude SARS-CoV-2 infection  and should not be used as the sole basis for treatment or other  patient management decisions.  A negative result may occur with  improper specimen collection / handling, submission of specimen other  than nasopharyngeal swab, presence of viral mutation(s) within the  areas targeted by this assay, and inadequate number of viral copies  (<250 copies / mL). A negative result must be combined with clinical  observations, patient history, and epidemiological information. If result is POSITIVE SARS-CoV-2 target nucleic acids are DETECTED. The SARS-CoV-2 RNA is generally detectable in upper and lower  respiratory specimens dur ing the acute phase of infection.  Positive  results are indicative of active infection with SARS-CoV-2.  Clinical  correlation with patient history and other diagnostic information is  necessary to determine patient infection status.  Positive results do  not rule out bacterial infection or co-infection with other viruses. If result is PRESUMPTIVE POSTIVE SARS-CoV-2 nucleic acids MAY BE PRESENT.   A presumptive positive result was obtained on the submitted specimen  and confirmed on repeat testing.  While 2019 novel  coronavirus  (SARS-CoV-2) nucleic acids may be present in the submitted sample  additional confirmatory testing may be necessary for epidemiological  and / or clinical management purposes  to differentiate between  SARS-CoV-2 and other Sarbecovirus currently known to infect humans.  If clinically indicated additional testing with an alternate test  methodology [phone]) is advised. The SARS-CoV-2 RNA is generally  detectable in upper and lower respiratory sp ecimens during the acute  phase of infection. The expected result is Negative. Fact Sheet for Patients:  StrictlyIdeas.no Fact Sheet for Healthcare Providers: BankingDealers.co.za This test is not yet approved or cleared by the [place] FDA and has been authorized for detection and/or diagnosis of SARS-CoV-2 by FDA under an Emergency Use Authorization (EUA).  This EUA will remain in effect (meaning this test can be used) for the duration of the COVID-19 declaration under Section 564(b)(1) of the Act, 21 U.S.C. section 360bbb-3(b)(1), unless the authorization is terminated or revoked sooner. Performed at [facility], [place] [address] [place], [place] [address]      Labs: CBC: Recent Labs  Lab 06/19/19 0024  WBC 9.5  HGB 13.0  HCT 39.0  MCV 92.0  PLT 491   Basic Metabolic Panel: Recent Labs  Lab 06/19/19 0024 06/20/19 0432  NA 138 139  K 4.6 4.2  CL 99 105  CO2 24 26  GLUCOSE 218* 281*  BUN 49* 41*  CREATININE 2.84* 1.33*  CALCIUM 9.9 8.8*   Liver Function Tests: Recent Labs  Lab 06/19/19 0632  AST 18  ALT 20  ALKPHOS 74  BILITOT 0.5  PROT 6.5  ALBUMIN 3.4*   No results for input(s): LIPASE, AMYLASE in the last 168 hours. No results for input(s): AMMONIA in the last 168 hours. Cardiac Enzymes: No results for input(s): CKTOTAL, CKMB, CKMBINDEX, TROPONINI in the last 168 hours. BNP (last 3 results) No results for input(s): BNP in the last 8760  hours. CBG: Recent Labs  Lab 06/19/19 0717 06/19/19 1117 06/19/19 1653 06/19/19 2004 06/20/19 0757  GLUCAP 71 175* 295* 263* 201*   Time spent: 35 minutes  Signed:  Berle Mull  [facility] 06/20/2019

## 2019-06-25 ENCOUNTER — Ambulatory Visit (INDEPENDENT_AMBULATORY_CARE_PROVIDER_SITE_OTHER): Payer: Self-pay | Admitting: Cardiovascular Disease

## 2019-06-25 ENCOUNTER — Other Ambulatory Visit: Payer: Self-pay

## 2019-06-25 ENCOUNTER — Encounter: Payer: Self-pay | Admitting: Cardiovascular Disease

## 2019-06-25 VITALS — BP 122/76 | HR 88 | Ht 62.0 in | Wt 219.0 lb

## 2019-06-25 DIAGNOSIS — E669 Obesity, unspecified: Secondary | ICD-10-CM | POA: Insufficient documentation

## 2019-06-25 DIAGNOSIS — I5032 Chronic diastolic (congestive) heart failure: Secondary | ICD-10-CM

## 2019-06-25 DIAGNOSIS — I10 Essential (primary) hypertension: Secondary | ICD-10-CM

## 2019-06-25 DIAGNOSIS — E1169 Type 2 diabetes mellitus with other specified complication: Secondary | ICD-10-CM | POA: Insufficient documentation

## 2019-06-25 DIAGNOSIS — I25119 Atherosclerotic heart disease of native coronary artery with unspecified angina pectoris: Secondary | ICD-10-CM

## 2019-06-25 NOTE — Patient Instructions (Signed)

## 2019-06-25 NOTE — Progress Notes (Signed)
Patient ID: [name], female   DOB: 16-Feb-1956, 63 y.o.   MRN: [id number]    Cardiology Office Note    Date:  06/25/2019   ID:  [name], DOB 08/30/1956, MRN [id number]  PCP:  [name], MD  Cardiologist:   Sanda Klein, MD   Chief Complaint  Patient presents with  . Coronary Artery Disease  . Hospitalization Follow-up    History of Present Illness:  [name] is a 63 y.o. female with chronic diastolic heart failure due to a combination of hypertensive and ischemic heart disease, returning for follow-up after brief hospitalization for complaints of chest pain and gastroenteritis.   She had several days of vomiting and diarrhea and developed chest discomfort.  She was hospitalized on July 3.  She only stayed about 48 hours in the hospital.  Thinking her ECG was normal and her high-sensitivity troponin was low and stable.  She had transient acute kidney injury with a creatinine that peaked at 2.8 but was down to 1.33 by the time of discharge.  Her diuretics have been temporarily on hold.  Once her GI symptoms resolved, her chest pain went away.  She has not had problems with shortness of breath at rest or with activity.  She denies leg edema.  Prior to the acute events she was not having issues with exertional angina or dyspnea, edema, PND, orthopnea, palpitations, dizziness or syncope.  She continues to smoke 2 or 3 black and mild cigars daily but does not smoke cigarettes.  She is morbidly obese with a BMI right at 40.  She is trying to improve glycemic control, lose weight and is wondering about the need to adjust her diabetes regimen.  Her most recent hemoglobin A1c was high at 8.5%.  She is no longer on Invokana, but is taking Victoza, in addition to metformin and sulfonylurea.  On the other hand her lipid profile is excellent on the current statin regimen with an LDL of 49, HDL of 46 and normal triglycerides.   Her last revascularization procedure was placement of a  drug-eluting stent in the right coronary artery roughly 4 years ago.  She underwent cardiac catheterization after an abnormal stress test that described extensive anterior and lateral ischemia in August 2019.  Cardiac catheterization showed occlusion of the distal left circumflex coronary artery, multiple moderate stenoses and relatively small vessels (70% distal LAD, 80% OM1, 50% OM 2) and a widely patent stent in the right coronary artery with a maximum stenosis of 20%.  Medical therapy was recommended.    Past Medical History:  Diagnosis Date  . Anginal pain ([place])   . Coronary atherosclerosis of native coronary artery, with stent to LCX in 2006 06/04/2013  . Diabetes mellitus without complication ([place])   . GERD (gastroesophageal reflux disease)   . Heart attack ([place])   . Hyperlipidemia LDL goal < 70 06/07/2013  . Hypertension   . S/P coronary artery stent placement to RCA with PROMUS DES, 06/07/13, residual disease on OM1, OM2 and rPDA 06/07/2013    Past Surgical History:  Procedure Laterality Date  . CARDIAC CATHETERIZATION N/A 08/22/2015   Procedure: Left Heart Cath and Coronary Angiography;  Surgeon: Leonie Man, MD;  Location: [facility];  Service: Cardiovascular;  Laterality: N/A;  . CARDIAC CATHETERIZATION N/A 08/22/2015   Procedure: Coronary Stent Intervention;  Surgeon: Leonie Man, MD;  Location: [facility];  Service: Cardiovascular;  Laterality: N/A;  . CARDIAC CATHETERIZATION N/A 08/22/2015  Procedure: Left Heart Cath and Coronary Angiography;  Surgeon: Leonie Man, MD;  Location: [facility];  Service: Cardiovascular;  Laterality: N/A;  . CORONARY STENT PLACEMENT  2006   TAXUS stent CFX in [facility]  . LEFT HEART CATH AND CORONARY ANGIOGRAPHY N/A 07/21/2018   Procedure: LEFT HEART CATH AND CORONARY ANGIOGRAPHY;  Surgeon: Troy Sine, MD;  Location: [facility];  Service: Cardiovascular;  Laterality: N/A;  . LEFT HEART CATHETERIZATION WITH  CORONARY ANGIOGRAM N/A 06/07/2013   Procedure: LEFT HEART CATHETERIZATION WITH CORONARY ANGIOGRAM;  Surgeon: Sanda Klein, MD;  Location: [facility];  Service: Cardiovascular;  Laterality: N/A;    Outpatient Medications Prior to Visit  Medication Sig Dispense Refill  . acetaminophen (TYLENOL) 500 MG tablet Take 1,000 mg by mouth daily as needed for moderate pain.     Marland Kitchen aspirin EC 81 MG EC tablet Take 1 tablet (81 mg total) by mouth daily.    Marland Kitchen atorvastatin (LIPITOR) 80 MG tablet TAKE 1 TABLET BY MOUTH DAILY AT 6 PM (Patient taking differently: Take 80 mg by mouth daily. ) 90 tablet 1  . clopidogrel (PLAVIX) 75 MG tablet Take 1 tablet (75 mg total) by mouth daily with breakfast. 90 tablet 1  . furosemide (LASIX) 20 MG tablet Take 2 tablets (40 mg total) by mouth daily as needed for fluid or edema (weight gain 3lbs in 1 day or 5 lbs in 2 days.). 180 tablet 1  . glimepiride (AMARYL) 4 MG tablet TAKE 1 TABLET BY MOUTH 2 TIMES DAILY WITH A MEAL. (Patient taking differently: Take 4 mg by mouth 2 (two) times a day. ) 180 tablet 1  . Insulin Pen Needle (TRUEPLUS PEN NEEDLES) 32G X 4 MM MISC Use as directed to inject victoza once daily 100 each 3  . isosorbide mononitrate (IMDUR) 30 MG 24 hr tablet Take 2 tablets (60 mg total) by mouth daily. 180 tablet 1  . liraglutide (VICTOZA) 18 MG/3ML SOPN Inject 0.3 mLs (1.8 mg total) into the skin daily with breakfast. Start with 0.1 mL daily for 1 week then 0.2 mL for 1 week then 0.3 mL 3 pen 6  . metFORMIN (GLUCOPHAGE) 500 MG tablet Take 2 tablets (1,000 mg total) by mouth 2 (two) times daily with a meal. 360 tablet 1  . metoprolol tartrate (LOPRESSOR) 100 MG tablet Take 1 tablet (100 mg total) by mouth 2 (two) times daily. 90 tablet 1  . nitroGLYCERIN (NITROSTAT) 0.4 MG SL tablet Place 1 tablet (0.4 mg total) under the tongue every 5 (five) minutes as needed for chest pain. 25 tablet 2  . pantoprazole (PROTONIX) 40 MG tablet Take 1 tablet (40 mg total) by mouth  daily. 90 tablet 1   No facility-administered medications prior to visit.      Allergies:   Patient has no known allergies.   Social History   Socioeconomic History  . Marital status: Divorced    Spouse name: Not on file  . Number of children: 1  . Years of education: Not on file  . Highest education level: Not on file  Occupational History  . Occupation: Investment banker, operational  . Financial resource strain: Not on file  . Food insecurity    Worry: Not on file    Inability: Not on file  . Transportation needs    Medical: Not on file    Non-medical: Not on file  Tobacco Use  . Smoking status: Former Smoker  Years: 25.00    Quit date: 07/03/2004    Years since quitting: 14.9  . Smokeless tobacco: Never Used  Substance and Sexual Activity  . Alcohol use: Yes    Comment: occ  . Drug use: No  . Sexual activity: Not on file  Lifestyle  . Physical activity    Days per week: Not on file    Minutes per session: Not on file  . Stress: Not on file  Relationships  . Social Herbalist on phone: Not on file    Gets together: Not on file    Attends religious service: Not on file    Active member of club or organization: Not on file    Attends meetings of clubs or organizations: Not on file    Relationship status: Not on file  Other Topics Concern  . Not on file  Social History Narrative  . Not on file    ROS:   Please see the history of present illness.    ROS All other systems reviewed and are negative.   PHYSICAL EXAM:   VS:  BP 122/76   Pulse 88   Ht 5\' 2"  (1.575 m)   Wt 219 lb (99.3 kg)   SpO2 97%   BMI 40.06 kg/m     General: Alert, oriented x3, no distress, morbidly obese Head: no evidence of trauma, PERRL, EOMI, no exophtalmos or lid lag, no myxedema, no xanthelasma; normal ears, nose and oropharynx Neck: normal jugular venous pulsations and no hepatojugular reflux; brisk carotid pulses without delay and no carotid bruits Chest: clear to  auscultation, no signs of consolidation by percussion or palpation, normal fremitus, symmetrical and full respiratory excursions Cardiovascular: normal position and quality of the apical impulse, regular rhythm, normal first and second heart sounds, no murmurs, rubs or gallops Abdomen: no tenderness or distention, no masses by palpation, no abnormal pulsatility or arterial bruits, normal bowel sounds, no hepatosplenomegaly Extremities: no clubbing, cyanosis or edema; 2+ radial, ulnar and brachial pulses bilaterally; 2+ right femoral, posterior tibial and dorsalis pedis pulses; 2+ left femoral, posterior tibial and dorsalis pedis pulses; no subclavian or femoral bruits Neurological: grossly nonfocal Psych: Normal mood and affect   Wt Readings from Last 3 Encounters:  06/25/19 219 lb (99.3 kg)  06/20/19 219 lb 12.8 oz (99.7 kg)  02/25/19 225 lb (102.1 kg)      Studies/Labs Reviewed:   EKG:  EKG is not ordered today.  ECG from July 3 shows normal sinus rhythm, normal tracing  Recent Labs: 06/19/2019: ALT 20; Hemoglobin 13.0; Platelets 301 06/20/2019: BUN 41; Creatinine, Ser 1.33; Potassium 4.2; Sodium 139   Lipid Panel    Component Value Date/Time   CHOL 113 02/25/2019 1008   TRIG 88 02/25/2019 1008   HDL 46 02/25/2019 1008   CHOLHDL 3.1 02/17/2018 1017   CHOLHDL 2.4 02/11/2017 1002   VLDL 18 02/11/2017 1002   LDLCALC 49 02/25/2019 1008     ASSESSMENT:    1. Coronary artery disease involving native coronary artery of native heart with angina pectoris ([place])   2. Chronic diastolic CHF (congestive heart failure) ([place])   3. Essential hypertension   4. Diabetes mellitus type 2 in obese [facility])      PLAN:  In order of problems listed above:  1. CHF: Obesity limits the exam but she appears to be clinically euvolemic and well compensated.  I think she can restart her diuretic.  Reinforced the need for sodium restriction and daily  weight monitoring.   2. CAD: She has some chest  discomfort during her acute GI illness.  But this has now resolved.  She appears to be symptomatically well on the low-dose of long-acting nitrates and beta-blockers.  CCS functional class I-2.. Only on a low dose of isosorbide mononitrate and beta blockers.  She has numerous moderate coronary lesions in the relatively small vessels that do not warrant percutaneous revascularization or surgery. 3. HTNive CMP: mild LVH by echo, but this is probably the most important contributor to her diastolic heart failure, more so than coronary insufficiency. 4. HTN: Excellent control 5. HLP: All parameters in target range on statin. 6. DM: Glycemic control has deteriorated.  Invokana was stopped, apparently due to the concerns of side effects including amputation.  If accessible, I still think she would be an excellent candidate for other SGLT2 inhibitor such as Jardiance or Farxiga, which should help with weight loss and improve long-term cardiovascular outcomes.  I also think that [name] is an excellent candidate for bariatric surgery such as gastric bypass, but she is afraid to consider this option.  Financial support for a bariatric program would also be an issue.    Medication Adjustments/Labs and Tests Ordered: Current medicines are reviewed at length with the patient today.  Concerns regarding medicines are outlined above.  Medication changes, Labs and Tests ordered today are listed in the Patient Instructions below. Patient Instructions  Medication Instructions:  Your physician recommends that you continue on your current medications as directed. Please refer to the Current Medication list given to you today.  If you need a refill on your cardiac medications before your next appointment, please call your pharmacy.   Lab work: None ordered If you have labs (blood work) drawn today and your tests are completely normal, you will receive your results only by: [place] (if you have MyChart) OR A  paper copy in the mail If you have any lab test that is abnormal or we need to change your treatment, we will call you to review the results.  Testing/Procedures: None ordered  Follow-Up: At [facility], you and your health needs are our priority.  As part of our continuing mission to provide you with exceptional heart care, we have created designated Provider Care Teams.  These Care Teams include your primary Cardiologist (physician) and Advanced Practice Providers (APPs -  Physician Assistants and Nurse Practitioners) who all work together to provide you with the care you need, when you need it. You will need a follow up appointment in 12 months.  Please call our office 2 months in advance to schedule this appointment.  You may see Sanda Klein, MD or one of the following Advanced Practice Providers on your designated Care Team: Almyra Deforest, PA-C Fabian Sharp, [place]            Signed, Sanda Klein, MD  06/25/2019 10:27 AM    [address], [place], [place]  [address] Phone: [phone]; Fax: [phone]

## 2019-06-29 MED FILL — METOPROLOL TARTRATE 100 MG: 100 | 30 days supply | Qty: 60 | Fill #4

## 2019-08-03 ENCOUNTER — Other Ambulatory Visit: Payer: Self-pay | Admitting: Family Medicine

## 2019-08-03 ENCOUNTER — Other Ambulatory Visit: Payer: Self-pay | Admitting: *Deleted

## 2019-08-03 DIAGNOSIS — I10 Essential (primary) hypertension: Secondary | ICD-10-CM

## 2019-08-03 MED ORDER — METOPROLOL TARTRATE 100 MG PO TABS: 100.0000 mg | ORAL_TABLET | Freq: Two times a day (BID) | ORAL | 0 refills | Status: DC

## 2019-08-03 MED FILL — GLIMEPIRIDE 4 MG TABS: 4 | 90 days supply | Qty: 180 | Fill #1

## 2019-08-03 MED FILL — ATORVASTATIN 80 MG TABLET: 80 | 90 days supply | Qty: 90 | Fill #3

## 2019-08-03 MED FILL — CLOPIDOGREL 75 MG TABLET: 75 | 90 days supply | Qty: 90 | Fill #3

## 2019-08-03 MED FILL — FUROSEMIDE 20 MG TABS: 20 | 90 days supply | Qty: 180 | Fill #3

## 2019-08-03 MED FILL — metFORMIN HCL 500 MG TABS: 500 | 90 days supply | Qty: 360 | Fill #3

## 2019-08-03 MED FILL — LISINOPRIL-HCTZ 20-12.5 MG: 20-12.5 | 90 days supply | Qty: 180 | Fill #1

## 2019-08-03 MED FILL — PANTOPRAZOLE SOD DR 40 MG T: 40 | 90 days supply | Qty: 90 | Fill #1

## 2019-08-03 MED FILL — ISOSORBIDE MN ER 30 MG TAB: 30 | 90 days supply | Qty: 180 | Fill #1

## 2019-08-03 MED FILL — METOPROLOL TARTRATE 100 MG: 100 | 30 days supply | Qty: 60 | Fill #0

## 2019-08-11 MED FILL — GLIMEPIRIDE 4 MG TABS: 4 | 30 days supply | Qty: 60 | Fill #1

## 2019-08-11 MED FILL — $VICTOZA 2-PAK 18MG/3ML PEN: 18 | 39 days supply | Qty: 9 | Fill #0

## 2019-08-11 MED FILL — CLOPIDOGREL 75 MG TABLET: 75 | 30 days supply | Qty: 30 | Fill #3

## 2019-08-11 MED FILL — METOPROLOL TARTRATE 100 MG: 100 | 30 days supply | Qty: 60 | Fill #0

## 2019-08-11 MED FILL — FUROSEMIDE 20 MG TABS: 20 | 30 days supply | Qty: 60 | Fill #3

## 2019-08-11 MED FILL — ISOSORBIDE MN ER 30 MG TAB: 30 | 30 days supply | Qty: 60 | Fill #1

## 2019-08-11 MED FILL — ATORVASTATIN 80 MG TABLET: 80 | 30 days supply | Qty: 30 | Fill #3

## 2019-08-11 MED FILL — LISINOPRIL-HCTZ 20-12.5 MG: 20-12.5 | 90 days supply | Qty: 180 | Fill #1

## 2019-08-11 MED FILL — metFORMIN HCL 500 MG TABS: 500 | 30 days supply | Qty: 120 | Fill #3

## 2019-08-11 MED FILL — ?PANTOPRAZOLE SOD DR 40MG: 40 MG | 90 days supply | Qty: 90 | Fill #1

## 2019-09-17 MED FILL — metFORMIN HCL 500 MG TABS: 500 | 60 days supply | Qty: 240 | Fill #4

## 2019-10-12 MED FILL — VICTOZA 2-PAK 18 MG/3 ML PE: 18 | 20 days supply | Qty: 6 | Fill #1

## 2019-11-17 MED FILL — FUROSEMIDE 20 MG TABS: 20 | 30 days supply | Qty: 60 | Fill #5

## 2019-11-17 MED FILL — ATORVASTATIN 80 MG TABLET: 80 | 30 days supply | Qty: 30 | Fill #5

## 2019-11-17 MED FILL — CLOPIDOGREL 75 MG TABLET: 75 | 30 days supply | Qty: 30 | Fill #5

## 2019-11-17 MED FILL — GLIMEPIRIDE 4 MG TABS: 4 | 30 days supply | Qty: 60 | Fill #3

## 2019-11-17 MED FILL — ISOSORBIDE MN ER 30 MG TAB: 30 | 30 days supply | Qty: 60 | Fill #3

## 2019-11-22 MED FILL — VICTOZA 18 MG/3 ML INJECT P: 18 | 30 days supply | Qty: 9 | Fill #2

## 2019-12-14 MED FILL — METOPROLOL TARTRATE 100 MG: 100 | 30 days supply | Qty: 60 | Fill #1

## 2019-12-20 ENCOUNTER — Encounter (HOSPITAL_BASED_OUTPATIENT_CLINIC_OR_DEPARTMENT_OTHER): Payer: Self-pay | Admitting: *Deleted

## 2019-12-20 ENCOUNTER — Other Ambulatory Visit: Payer: Self-pay

## 2019-12-20 DIAGNOSIS — M25511 Pain in right shoulder: Secondary | ICD-10-CM | POA: Insufficient documentation

## 2019-12-20 DIAGNOSIS — Z5321 Procedure and treatment not carried out due to patient leaving prior to being seen by health care provider: Secondary | ICD-10-CM | POA: Insufficient documentation

## 2019-12-20 NOTE — ED Triage Notes (Signed)
Pt c/ right shoulder pain and right behind the knee pain x 2 weeks, denies injury

## 2019-12-21 ENCOUNTER — Emergency Department (HOSPITAL_BASED_OUTPATIENT_CLINIC_OR_DEPARTMENT_OTHER)
Admission: EM | Admit: 2019-12-21 | Discharge: 2019-12-21 | Disposition: A | Discharge status: Home / Self Care | Payer: Self-pay | Attending: Emergency Medicine | Admitting: Emergency Medicine

## 2019-12-21 NOTE — ED Notes (Signed)
Called to take to room  No response from lobby  

## 2019-12-21 NOTE — ED Notes (Signed)
Called x 2  No one in lobby at this time

## 2019-12-31 ENCOUNTER — Other Ambulatory Visit: Payer: Self-pay

## 2019-12-31 ENCOUNTER — Emergency Department (HOSPITAL_COMMUNITY): Payer: Self-pay

## 2019-12-31 ENCOUNTER — Emergency Department (HOSPITAL_COMMUNITY)
Admission: EM | Admit: 2019-12-31 | Discharge: 2019-12-31 | Disposition: A | Discharge status: Home / Self Care | Payer: Self-pay | Attending: Emergency Medicine | Admitting: Emergency Medicine

## 2019-12-31 ENCOUNTER — Encounter (HOSPITAL_COMMUNITY): Payer: Self-pay

## 2019-12-31 DIAGNOSIS — Z79899 Other long term (current) drug therapy: Secondary | ICD-10-CM | POA: Insufficient documentation

## 2019-12-31 DIAGNOSIS — I110 Hypertensive heart disease with heart failure: Secondary | ICD-10-CM | POA: Insufficient documentation

## 2019-12-31 DIAGNOSIS — E669 Obesity, unspecified: Secondary | ICD-10-CM | POA: Insufficient documentation

## 2019-12-31 DIAGNOSIS — I252 Old myocardial infarction: Secondary | ICD-10-CM | POA: Insufficient documentation

## 2019-12-31 DIAGNOSIS — M25511 Pain in right shoulder: Secondary | ICD-10-CM | POA: Insufficient documentation

## 2019-12-31 DIAGNOSIS — E119 Type 2 diabetes mellitus without complications: Secondary | ICD-10-CM | POA: Insufficient documentation

## 2019-12-31 DIAGNOSIS — M5441 Lumbago with sciatica, right side: Secondary | ICD-10-CM | POA: Insufficient documentation

## 2019-12-31 DIAGNOSIS — Z6841 Body Mass Index (BMI) 40.0 and over, adult: Secondary | ICD-10-CM | POA: Insufficient documentation

## 2019-12-31 DIAGNOSIS — I5031 Acute diastolic (congestive) heart failure: Secondary | ICD-10-CM | POA: Insufficient documentation

## 2019-12-31 DIAGNOSIS — Z7982 Long term (current) use of aspirin: Secondary | ICD-10-CM | POA: Insufficient documentation

## 2019-12-31 DIAGNOSIS — Z7984 Long term (current) use of oral hypoglycemic drugs: Secondary | ICD-10-CM | POA: Insufficient documentation

## 2019-12-31 DIAGNOSIS — R0609 Other forms of dyspnea: Secondary | ICD-10-CM | POA: Insufficient documentation

## 2019-12-31 DIAGNOSIS — R609 Edema, unspecified: Secondary | ICD-10-CM | POA: Insufficient documentation

## 2019-12-31 DIAGNOSIS — I2510 Atherosclerotic heart disease of native coronary artery without angina pectoris: Secondary | ICD-10-CM | POA: Insufficient documentation

## 2019-12-31 LAB — COMPREHENSIVE METABOLIC PANEL
ALT: 17 U/L (ref 0–44)
AST: 16 U/L (ref 15–41)
Albumin: 3.5 g/dL (ref 3.5–5.0)
Alkaline Phosphatase: 94 U/L (ref 38–126)
Anion gap: 12 (ref 5–15)
BUN: 17 mg/dL (ref 8–23)
CO2: 23 mmol/L (ref 22–32)
Calcium: 9.2 mg/dL (ref 8.9–10.3)
Chloride: 105 mmol/L (ref 98–111)
Creatinine, Ser: 1.1 mg/dL — ABNORMAL HIGH (ref 0.44–1.00)
GFR calc Af Amer: >60 mL/min (ref >60)
GFR calc non Af Amer: 54 mL/min — ABNORMAL LOW (ref >60)
Glucose, Bld: 132 mg/dL — ABNORMAL HIGH (ref 70–99)
Potassium: 3.5 mmol/L (ref 3.5–5.1)
Sodium: 140 mmol/L (ref 135–145)
Total Bilirubin: 0.4 mg/dL (ref 0.3–1.2)
Total Protein: 7 g/dL (ref 6.5–8.1)

## 2019-12-31 LAB — TROPONIN I (HIGH SENSITIVITY): Troponin I (High Sensitivity): 4 ng/L (ref <18)

## 2019-12-31 LAB — URINALYSIS, ROUTINE W REFLEX MICROSCOPIC
Bilirubin Urine: NEGATIVE
Glucose, UA: NEGATIVE mg/dL
Hgb urine dipstick: NEGATIVE
Ketones, ur: NEGATIVE mg/dL
Leukocytes,Ua: NEGATIVE
Nitrite: NEGATIVE
Protein, ur: NEGATIVE mg/dL
Specific Gravity, Urine: 1.012 (ref 1.005–1.030)
pH: 5 (ref 5.0–8.0)

## 2019-12-31 LAB — CBC WITH DIFFERENTIAL/PLATELET
Abs Immature Granulocytes: 0.04 10*3/uL (ref 0.00–0.07)
Basophils Absolute: 0 10*3/uL (ref 0.0–0.1)
Basophils Relative: 0 %
Eosinophils Absolute: 0.2 10*3/uL (ref 0.0–0.5)
Eosinophils Relative: 3 %
HCT: 37.1 % (ref 36.0–46.0)
Hemoglobin: 12.1 g/dL (ref 12.0–15.0)
Immature Granulocytes: 1 %
Lymphocytes Relative: 40 %
Lymphs Abs: 2.8 10*3/uL (ref 0.7–4.0)
MCH: 31.4 pg (ref 26.0–34.0)
MCHC: 32.6 g/dL (ref 30.0–36.0)
MCV: 96.4 fL (ref 80.0–100.0)
Monocytes Absolute: 0.6 10*3/uL (ref 0.1–1.0)
Monocytes Relative: 8 %
Neutro Abs: 3.4 10*3/uL (ref 1.7–7.7)
Neutrophils Relative %: 48 %
Platelets: 308 10*3/uL (ref 150–400)
RBC: 3.85 MIL/uL — ABNORMAL LOW (ref 3.87–5.11)
RDW: 13.8 % (ref 11.5–15.5)
WBC: 7 10*3/uL (ref 4.0–10.5)
nRBC: 0 % (ref 0.0–0.2)

## 2019-12-31 LAB — BRAIN NATRIURETIC PEPTIDE: B Natriuretic Peptide: 83.8 pg/mL (ref 0.0–100.0)

## 2019-12-31 MED ORDER — CYCLOBENZAPRINE HCL 10 MG PO TABS: 10.0000 mg | ORAL_TABLET | Freq: Two times a day (BID) | ORAL | 0 refills | Status: DC | PRN

## 2019-12-31 MED ORDER — HYDROCODONE-ACETAMINOPHEN 5-325 MG PO TABS
1.0000 | ORAL_TABLET | Freq: Once | ORAL | Status: AC
  Administered 2019-12-31: 1 via ORAL
  Filled 2019-12-31: qty 1

## 2019-12-31 MED ORDER — ACETAMINOPHEN 500 MG PO TABS: 500.0000 mg | ORAL_TABLET | Freq: Four times a day (QID) | ORAL | 0 refills | Status: DC | PRN

## 2019-12-31 MED ORDER — CYCLOBENZAPRINE HCL 10 MG PO TABS
5.0000 mg | ORAL_TABLET | Freq: Once | ORAL | Status: AC
  Administered 2019-12-31: 5 mg via ORAL
  Filled 2019-12-31: qty 1

## 2019-12-31 NOTE — Discharge Instructions (Addendum)
1. Medications: Take [phone] mg of Tylenol every 6 hours as needed for pain. Do not exceed 4000 mg of Tylenol daily. You can take Flexeril as needed for muscle spasm up to twice daily but do not drive, drink alcohol, or operate heavy machinery while taking this medicine because it may make you drowsy.  I typically recommend taking this medicine only at night when you are going to sleep.  You can also cut these tablets in half if they make you feel very drowsy. 2. Treatment: rest, drink plenty of fluids, gentle stretching as discussed (see attached), alternate ice and heat (or stick with whichever feels best) 20 minutes on 20 minutes off.  Elevate your legs when you are not at work.  When you are at work wear compression stockings to help reduce leg swelling. 3. Follow Up: Please followup with your primary doctor in 3-7 days for discussion of your diagnoses and further evaluation after today's visit; if you do not have a primary care doctor use the resource guide provided to find one;  Return to the ER for worsening back pain, difficulty walking, loss of bowel or bladder control or other concerning symptoms

## 2019-12-31 NOTE — ED Triage Notes (Signed)
Pt arrives POV for eval of R side pain from shoulder to R foot, states has been going on for 2-3 weeks. Seen at [facility] for same recently. Pt also endorses blt LE edema which ongoing for also 2-3 weeks. States she is anticoagulated for hx of stents, but cannot recall name of blood thinner.

## 2019-12-31 NOTE — ED Provider Notes (Signed)
[facility] EMERGENCY DEPARTMENT Provider Note   CSN: [id number] Arrival date & time: 12/31/19  1324     History Chief Complaint  Patient presents with  . R Side Pain  . Leg Swelling    [name] is a 64 y.o. female with history of CAD, diabetes mellitus, hyperlipidemia, hypertension, GERD, obesity presents for evaluation of acute onset, persistent and progressively worsening bilateral lower extremity edema and right-sided pains for 2 to 3 weeks.  Reports edema in her lower extremities that worsens with prolonged standing which she does a lot of at work.  Also notes dyspnea on exertion reports she cannot ambulate as far as she normally can and has been using the motorized scooter while grocery shopping here lately.  Denies chest pain, fever, cough, abdominal pain, nausea, or vomiting.  Denies recent travel or surgeries, no hemoptysis, no prior history of DVT or PE and she is not on hormone replacement therapy.  She notes aching pain to the right lower back radiating down the posterior aspect of the right lower extremity.  Denies numbness or weakness.  Also reports similar pains to the right shoulder that radiates up to the right side of the neck.  Worsens with certain movements.  Denies bowel or bladder incontinence, saddle anesthesia, fevers or history of IV drug use.  She has been taking tylenol and using topical ointments without relief of symptoms.  The history is provided by the patient.       Past Medical History:  Diagnosis Date  . Anginal pain ([place])   . Coronary atherosclerosis of native coronary artery, with stent to LCX in 2006 06/04/2013  . Diabetes mellitus without complication ([place])   . GERD (gastroesophageal reflux disease)   . Heart attack ([place])   . Hyperlipidemia LDL goal < 70 06/07/2013  . Hypertension   . S/P coronary artery stent placement to RCA with PROMUS DES, 06/07/13, residual disease on OM1, OM2 and rPDA 06/07/2013    Patient Active  Problem List   Diagnosis Date Noted  . Diabetes mellitus type 2 in obese ([place]) 06/25/2019  . Angina pectoris ([place]) 06/19/2019  . Viral gastroenteritis 06/19/2019  . AKI (acute kidney injury) ([place]) 06/19/2019  . Coronary artery disease involving native coronary artery of native heart with angina pectoris ([place])   . Osteoarthritis of both hands 05/01/2016  . Chronic diastolic CHF (congestive heart failure) ([place]) 12/06/2015  . Non-compliance with treatment-(cost) 08/24/2015  . Hypertensive cardiovascular disease 08/24/2015  . Dyslipidemia 08/24/2015  . CAD -S/P RCA DES 08/22/15   . NSTEMI (non-ST elevated myocardial infarction) ([place]) 08/18/2015  . Morbid obesity ([place]) 06/15/2013  . Type 2 diabetes mellitus, uncontrolled ([place]) 06/04/2013  . Essential hypertension 06/04/2013    Past Surgical History:  Procedure Laterality Date  . CARDIAC CATHETERIZATION N/A 08/22/2015   Procedure: Left Heart Cath and Coronary Angiography;  Surgeon: Leonie Man, MD;  Location: [facility];  Service: Cardiovascular;  Laterality: N/A;  . CARDIAC CATHETERIZATION N/A 08/22/2015   Procedure: Coronary Stent Intervention;  Surgeon: Leonie Man, MD;  Location: [facility];  Service: Cardiovascular;  Laterality: N/A;  . CARDIAC CATHETERIZATION N/A 08/22/2015   Procedure: Left Heart Cath and Coronary Angiography;  Surgeon: Leonie Man, MD;  Location: [facility];  Service: Cardiovascular;  Laterality: N/A;  . CORONARY STENT PLACEMENT  2006   TAX[facility] stent [facility]X in [facility]  . LEFT HEART CATH AND CORONARY ANGIOGRAPHY N/A 07/21/2018   Procedure:  LEFT HEART CATH AND CORONARY ANGIOGRAPHY;  Surgeon: Troy Sine, MD;  Location: [facility];  Service: Cardiovascular;  Laterality: N/A;  . LEFT HEART CATHETERIZATION WITH CORONARY ANGIOGRAM N/A 06/07/2013   Procedure: LEFT HEART CATHETERIZATION WITH CORONARY ANGIOGRAM;  Surgeon: Sanda Klein, MD;  Location: [facility];  Service: Cardiovascular;   Laterality: N/A;     OB History   No obstetric history on file.     Family History  Problem Relation Age of Onset  . Diabetes Mother   . Hypertension Mother   . Diabetes Sister   . Diabetes Brother   . Hypertension Brother     Social History   Tobacco Use  . Smoking status: Former Smoker    Years: 25.00    Quit date: 07/03/2004    Years since quitting: 15.5  . Smokeless tobacco: Never Used  Substance Use Topics  . Alcohol use: Yes    Comment: occ  . Drug use: No    Home Medications Prior to Admission medications   Medication Sig Start Date End Date Taking? Authorizing Provider  acetaminophen (TYLENOL) 500 MG tablet Take 1 tablet (500 mg total) by mouth every 6 (six) hours as needed. 12/31/19   Rodell Perna A, PA-C  aspirin EC 81 MG EC tablet Take 1 tablet (81 mg total) by mouth daily. 08/24/15   Erlene Quan, PA-C  atorvastatin (LIPITOR) 80 MG tablet TAKE 1 TABLET BY MOUTH DAILY AT 6 PM Patient taking differently: Take 80 mg by mouth daily.  02/25/19   [name], MD  clopidogrel (PLAVIX) 75 MG tablet Take 1 tablet (75 mg total) by mouth daily with breakfast. 02/25/19   [name], MD  cyclobenzaprine (FLEXERIL) 10 MG tablet Take 1 tablet (10 mg total) by mouth 2 (two) times daily as needed for muscle spasms. 12/31/19   Chelsey Kimberley A, PA-C  furosemide (LASIX) 20 MG tablet Take 2 tablets (40 mg total) by mouth daily as needed for fluid or edema (weight gain 3lbs in 1 day or 5 lbs in 2 days.). 06/20/19   Lavina Hamman, MD  glimepiride (AMARYL) 4 MG tablet TAKE 1 TABLET BY MOUTH 2 TIMES DAILY WITH A MEAL. Patient taking differently: Take 4 mg by mouth 2 (two) times a day.  02/25/19   [name], MD  Insulin Pen Needle (TRUEPLUS PEN NEEDLES) 32G X 4 MM MISC Use as directed to inject victoza once daily 08/20/18   [name], MD  isosorbide mononitrate (IMDUR) 30 MG 24 hr tablet Take 2 tablets (60 mg total) by mouth daily. 02/25/19   [name], MD  liraglutide  (VICTOZA) 18 MG/3ML SOPN Inject 0.3 mLs (1.8 mg total) into the skin daily with breakfast. Start with 0.1 mL daily for 1 week then 0.2 mL for 1 week then 0.3 mL 02/25/19   [name], MD  metFORMIN (GLUCOPHAGE) 500 MG tablet Take 2 tablets (1,000 mg total) by mouth 2 (two) times daily with a meal. 02/25/19   [name], MD  metoprolol tartrate (LOPRESSOR) 100 MG tablet Take 1 tablet (100 mg total) by mouth 2 (two) times daily. 08/03/19   [name], MD  nitroGLYCERIN (NITROSTAT) 0.4 MG SL tablet Place 1 tablet (0.4 mg total) under the tongue every 5 (five) minutes as needed for chest pain. 08/24/15   Erlene Quan, PA-C  pantoprazole (PROTONIX) 40 MG tablet Take 1 tablet (40 mg total) by mouth daily. 02/25/19   [name], MD    Allergies  Patient has no known allergies.  Review of Systems   Review of Systems  Constitutional: Negative for chills and fever.  Respiratory: Positive for shortness of breath. Negative for cough.   Cardiovascular: Positive for leg swelling. Negative for chest pain.  Gastrointestinal: Negative for abdominal pain, nausea and vomiting.  Musculoskeletal: Positive for arthralgias and back pain.  Neurological: Negative for weakness and numbness.  All other systems reviewed and are negative.   Physical Exam Updated Vital Signs BP (!) 174/82   Pulse 83   Temp 97.8 F (36.6 C) (Oral)   Resp 17   Ht 5\' 2"  (1.575 m)   Wt 102.1 kg   SpO2 98%   BMI 41.15 kg/m   Physical Exam Vitals and nursing note reviewed.  Constitutional:      General: She is not in acute distress.    Appearance: She is well-developed.  HENT:     Head: Normocephalic and atraumatic.  Eyes:     General:        Right eye: No discharge.        Left eye: No discharge.     Extraocular Movements: Extraocular movements intact.     Conjunctiva/sclera: Conjunctivae normal.     Pupils: Pupils are equal, round, and reactive to light.  Neck:     Vascular: No JVD.     Trachea: No  tracheal deviation.  Cardiovascular:     Rate and Rhythm: Normal rate and regular rhythm.     Pulses: Normal pulses.     Heart sounds: Normal heart sounds.     Comments: 2+ radial and DP/PT pulses bilaterally, Homans sign absent bilaterally, 2+ lower extremity edema, no palpable cords, compartments are soft  Pulmonary:     Effort: Pulmonary effort is normal.  Abdominal:     General: Bowel sounds are normal. There is no distension.     Palpations: Abdomen is soft.     Tenderness: There is no abdominal tenderness. There is no guarding.  Musculoskeletal:        General: Tenderness present.     Cervical back: Normal range of motion and neck supple. No tenderness.     Comments: No midline spine tenderness.  Right-sided paracervical and paralumbar muscle tenderness.  5/5 strength of BUE and BLE major muscle groups.  Negative straight leg raise bilaterally.  Tenderness to palpation around right acromioclavicular joint, pain with abduction and flexion at the right shoulder. Positive Hawkin's impingement test and arc of pain.   Skin:    General: Skin is warm and dry.     Capillary Refill: Capillary refill takes less than 2 seconds.     Findings: No erythema.  Neurological:     Mental Status: She is alert.     Comments: Fluent speech, no facial droop, sensation intact to light touch of bilateral upper and lower extremities.  Psychiatric:        Behavior: Behavior normal.     ED Results / Procedures / Treatments   Labs (all labs ordered are listed, but only abnormal results are displayed) Labs Reviewed  COMPREHENSIVE METABOLIC PANEL - Abnormal; Notable for the following components:      Result Value   Glucose, Bld 132 (*)    Creatinine, Ser 1.10 (*)    GFR calc non Af Amer 54 (*)    All other components within normal limits  CBC WITH DIFFERENTIAL/PLATELET - Abnormal; Notable for the following components:   RBC 3.85 (*)    All other components within normal  limits  URINALYSIS, ROUTINE  W REFLEX MICROSCOPIC - Abnormal; Notable for the following components:   Color, Urine STRAW (*)    All other components within normal limits  BRAIN NATRIURETIC PEPTIDE  TROPONIN I (HIGH SENSITIVITY)    EKG None  Radiology DG Chest Portable 1 View  Result Date: 12/31/2019 CLINICAL DATA:  Shortness of breath EXAM: PORTABLE CHEST 1 VIEW COMPARISON:  06/19/2019 FINDINGS: Heart and mediastinal contours are within normal limits. No focal opacities or effusions. No acute bony abnormality. IMPRESSION: Negative. Electronically Signed   By: Rolm Baptise M.D.   On: 12/31/2019 20:05    Procedures Procedures (including critical care time)  Medications Ordered in ED Medications  cyclobenzaprine (FLEXERIL) tablet 5 mg (5 mg Oral Given 12/31/19 2052)  HYDROcodone-acetaminophen (NORCO/VICODIN) 5-325 MG per tablet 1 tablet (1 tablet Oral Given 12/31/19 2053)    ED Course  I have reviewed the triage vital signs and the nursing notes.  Pertinent labs & imaging results that were available during my care of the patient were reviewed by me and considered in my medical decision making (see chart for details).    MDM Rules/Calculators/A&P                      Patient presenting for evaluation of bilateral lower extremity edema, dyspnea on exertion for 3 weeks.  Also complaining of right-sided shoulder pain and low back pain radiating down the posterior aspect of the right lower extremity.  [place], intermittently hypertensive in the ED.  She is nontoxic in appearance.  Her pain is reproducible on palpation.  She is neurovascularly intact and ambulatory in the ED with steady gait and balance.  No red flag signs concerning for cauda equina or spinal abscess.  No history of trauma, doubt fracture.  Portions of physical examination of the right shoulder suggestive of rotator cuff pathology.  Suspect that she has musculoskeletal back pain and shoulder pain.  No midline cervical or lumbar spine tenderness.  No chest  pain.  EKG shows no acute ischemic abnormalities.  A single troponin was obtained which was negative and I do not think she requires serial troponins given she does not have any chest pain.  Chest x-ray shows no acute cardiopulmonary abnormalities, no evidence of edema or consolidation.  Lab work reviewed by me shows no leukocytosis, no anemia, no metabolic derangements.  BNP is within normal limits.  Creatinine is very mildly elevated but BUN is within normal limits and in general she appears well-hydrated.  He was ambulated in the ED with stable SPO2 saturations.  Suspect that her lower extremity edema is dependent due to standing for long periods of time at work.  Discussed using compression stockings, elevating at night.  Discussed conservative therapy and management of her back pain and shoulder pain, and advised of appropriate use of muscle relaxers and potential side effects.  She will be referred to orthopedics for outpatient follow-up.  Discussed strict ED return precautions. Patient verbalized understanding of and agreement with plan and is safe for discharge home at this time.    Final Clinical Impression(s) / ED Diagnoses Final diagnoses:  Acute right-sided low back pain with right-sided sciatica  Acute pain of right shoulder  Peripheral edema    Rx / DC Orders ED Discharge Orders         Ordered    cyclobenzaprine (FLEXERIL) 10 MG tablet  2 times daily PRN     12/31/19 2152    acetaminophen (TYLENOL) 500  MG tablet  Every 6 hours PRN     12/31/19 2152           Debroah Baller 12/31/19 2312    Noemi Chapel, MD 12/31/19 2325

## 2019-12-31 NOTE — ED Notes (Signed)
Patient updated on POC.  Sitting in stretcher resting, eating snack brought from home.

## 2019-12-31 NOTE — ED Provider Notes (Signed)
Medical screening examination/treatment/procedure(s) were conducted as a shared visit with non-physician practitioner(s) and myself.  I personally evaluated the patient during the encounter.  Clinical Impression:   Final diagnoses:  None    EKG performed on December 31, 2019 at 7:40 PM shows normal sinus rhythm, rate of approximately 67 bpm, normal axis, normal intervals, normal ST segments and T waves, this is a totally normal EKG.   Noemi Chapel, MD 12/31/19 1949

## 2020-01-10 ENCOUNTER — Other Ambulatory Visit: Payer: Self-pay | Admitting: Family Medicine

## 2020-01-10 DIAGNOSIS — I10 Essential (primary) hypertension: Secondary | ICD-10-CM

## 2020-01-10 DIAGNOSIS — I2510 Atherosclerotic heart disease of native coronary artery without angina pectoris: Secondary | ICD-10-CM

## 2020-01-10 DIAGNOSIS — E785 Hyperlipidemia, unspecified: Secondary | ICD-10-CM

## 2020-01-10 DIAGNOSIS — E1169 Type 2 diabetes mellitus with other specified complication: Secondary | ICD-10-CM

## 2020-01-10 DIAGNOSIS — I5032 Chronic diastolic (congestive) heart failure: Secondary | ICD-10-CM

## 2020-01-10 DIAGNOSIS — Z9861 Coronary angioplasty status: Secondary | ICD-10-CM

## 2020-01-10 MED FILL — ISOSORBIDE MN ER 30 MG TAB: 30 | 30 days supply | Qty: 60 | Fill #4

## 2020-01-10 MED FILL — FUROSEMIDE 20 MG TABS: 20 | 30 days supply | Qty: 60 | Fill #0

## 2020-01-10 MED FILL — ATORVASTATIN 80 MG TABLET: 80 | 30 days supply | Qty: 30 | Fill #0

## 2020-01-10 MED FILL — VICTOZA 18 MG/3 ML INJECT P: 18 | 30 days supply | Qty: 9 | Fill #3

## 2020-01-10 MED FILL — CLOPIDOGREL 75 MG TABLET: 75 | 30 days supply | Qty: 30 | Fill #0

## 2020-01-10 MED FILL — metFORMIN HCL 500 MG TABS: 500 | 30 days supply | Qty: 120 | Fill #0

## 2020-01-10 MED FILL — PANTOPRAZOLE SOD DR 40 MG T: 40 | 30 days supply | Qty: 30 | Fill #0

## 2020-01-10 MED FILL — METOPROLOL TARTRATE 100 MG: 100 | 30 days supply | Qty: 60 | Fill #2

## 2020-01-10 MED FILL — GLIMEPIRIDE 4 MG TABS: 4 | 30 days supply | Qty: 60 | Fill #0

## 2020-01-13 ENCOUNTER — Other Ambulatory Visit: Payer: Self-pay | Admitting: Family Medicine

## 2020-01-13 DIAGNOSIS — I10 Essential (primary) hypertension: Secondary | ICD-10-CM

## 2020-01-18 ENCOUNTER — Encounter: Payer: Self-pay | Admitting: Family Medicine

## 2020-01-18 ENCOUNTER — Other Ambulatory Visit: Payer: Self-pay | Admitting: Family Medicine

## 2020-01-18 ENCOUNTER — Ambulatory Visit: Payer: Self-pay | Attending: Family Medicine | Admitting: Family Medicine

## 2020-01-18 ENCOUNTER — Other Ambulatory Visit: Payer: Self-pay

## 2020-01-18 VITALS — BP 134/85 | HR 74 | Temp 97.8°F | Resp 18 | Ht 62.0 in | Wt 215.0 lb

## 2020-01-18 DIAGNOSIS — E1159 Type 2 diabetes mellitus with other circulatory complications: Secondary | ICD-10-CM

## 2020-01-18 DIAGNOSIS — I2510 Atherosclerotic heart disease of native coronary artery without angina pectoris: Secondary | ICD-10-CM

## 2020-01-18 DIAGNOSIS — L439 Lichen planus, unspecified: Secondary | ICD-10-CM

## 2020-01-18 DIAGNOSIS — Z9861 Coronary angioplasty status: Secondary | ICD-10-CM

## 2020-01-18 DIAGNOSIS — Z1211 Encounter for screening for malignant neoplasm of colon: Secondary | ICD-10-CM

## 2020-01-18 DIAGNOSIS — M7501 Adhesive capsulitis of right shoulder: Secondary | ICD-10-CM

## 2020-01-18 DIAGNOSIS — I5032 Chronic diastolic (congestive) heart failure: Secondary | ICD-10-CM

## 2020-01-18 DIAGNOSIS — I10 Essential (primary) hypertension: Secondary | ICD-10-CM

## 2020-01-18 DIAGNOSIS — E785 Hyperlipidemia, unspecified: Secondary | ICD-10-CM

## 2020-01-18 DIAGNOSIS — G8929 Other chronic pain: Secondary | ICD-10-CM

## 2020-01-18 DIAGNOSIS — M25561 Pain in right knee: Secondary | ICD-10-CM

## 2020-01-18 DIAGNOSIS — Z1231 Encounter for screening mammogram for malignant neoplasm of breast: Secondary | ICD-10-CM

## 2020-01-18 LAB — POCT GLYCOSYLATED HEMOGLOBIN (HGB A1C): Hemoglobin A1C: 7.3 % — AB (ref 4.0–5.6)

## 2020-01-18 LAB — GLUCOSE, POCT (MANUAL RESULT ENTRY): POC Glucose: 118 mg/dl — AB (ref 70–99)

## 2020-01-18 MED ORDER — PANTOPRAZOLE SODIUM 40 MG PO TBEC: 40.0000 mg | DELAYED_RELEASE_TABLET | Freq: Every day | ORAL | 6 refills | Status: DC

## 2020-01-18 MED ORDER — GLIMEPIRIDE 4 MG PO TABS: 8.0000 mg | ORAL_TABLET | Freq: Every day | ORAL | 6 refills | Status: DC

## 2020-01-18 MED ORDER — PREDNISONE 20 MG PO TABS: 20.0000 mg | ORAL_TABLET | Freq: Two times a day (BID) | ORAL | 0 refills | Status: DC

## 2020-01-18 MED ORDER — ISOSORBIDE MONONITRATE ER 30 MG PO TB24: 60.0000 mg | ORAL_TABLET | Freq: Every day | ORAL | 1 refills | Status: DC

## 2020-01-18 MED ORDER — TRIAMCINOLONE ACETONIDE 0.1 % EX CREA: 1.0000 "application " | TOPICAL_CREAM | Freq: Two times a day (BID) | CUTANEOUS | 1 refills | Status: DC

## 2020-01-18 MED ORDER — METFORMIN HCL 500 MG PO TABS: 1000.0000 mg | ORAL_TABLET | Freq: Two times a day (BID) | ORAL | 1 refills | Status: DC

## 2020-01-18 MED ORDER — ATORVASTATIN CALCIUM 80 MG PO TABS: 80.0000 mg | ORAL_TABLET | Freq: Every day | ORAL | 6 refills | Status: DC

## 2020-01-18 MED ORDER — DICLOFENAC SODIUM 1 % EX GEL: 4.0000 g | Freq: Four times a day (QID) | CUTANEOUS | 1 refills | Status: DC

## 2020-01-18 MED ORDER — METOPROLOL TARTRATE 100 MG PO TABS: 100.0000 mg | ORAL_TABLET | Freq: Two times a day (BID) | ORAL | 6 refills | Status: DC

## 2020-01-18 MED ORDER — CLOPIDOGREL BISULFATE 75 MG PO TABS: 75.0000 mg | ORAL_TABLET | Freq: Every day | ORAL | 6 refills | Status: DC

## 2020-01-18 MED FILL — TRIAMCINOLONE ACETONIDE 0.1: 0.1 | 30 days supply | Qty: 45 | Fill #0

## 2020-01-18 MED FILL — predniSONE 20 MG TABS: 20 | 5 days supply | Qty: 10 | Fill #0

## 2020-01-18 NOTE — Patient Instructions (Signed)
Lichen Planus Lichen planus is a skin problem. It causes redness, itching, small bumps, and sores. Areas of the body that are often affected include:  Arms, wrists, legs, or ankles.  Chest, back, or belly (abdomen).  Genital areas such as the vulva and vagina.  Gums and inside of the mouth.  Scalp.  Fingernails or toenails. Treatment can help to control symptoms. This condition can last for a long time. What are the causes? The exact cause of this condition is not known. It is not passed from one person to another (not contagious). What increases the risk? You are more likely to get this condition if you:  Are older than 62 years of age.  Take certain medicines.  Have been around or had contact with certain dyes or chemicals.  Have hepatitis C.  Are a woman. What are the signs or symptoms? Symptoms of this condition may include:  Itching. This can be very bad.  Small red or purple bumps on the skin. These may have flat tops and may be round or irregular shaped.  Redness or white patches on the gums or tongue.  Redness, soreness, or a burning feeling in the genital area. This may lead to pain or bleeding during sex.  Changes in the fingernails or toenails. The nails may become thin or rough. They may have ridges in them.  Redness or irritation of the eyes. This is rare. How is this diagnosed? This condition may be diagnosed with:  A physical exam. The doctor will look at your affected skin and check for changes inside your mouth.  Removal of a sample of your skin to look at it under a microscope (biopsy). How is this treated? Treatment may depend on your symptoms. In some cases, no treatment is needed. If treatment is needed, it may include:  Creams or ointments (topical steroids) to help with itching and irritation.  Medicine to be taken by mouth.  Medicine to be taken by a shot (injection).  A treatment in which your skin is exposed to a type of light  (phototherapy).  Lozenges that you suck on to help treat sores in the mouth. Follow these instructions at home:   Take or use over-the-counter and prescription medicines only as told by your doctor.  Use creams or ointments as told by your doctor.  Do not scratch the affected areas of skin.  If you are a woman, keep the vagina as clean and dry as you can.  If you have sores in your mouth, avoid: ? Spicy and acidic foods. ? Alcohol. ? Tobacco.  Keep all follow-up visits as told by your doctor. This is important. Contact a doctor if:  Your redness, swelling, or pain gets worse.  You have fluid, blood, or pus coming from the affected area.  Your eyes become irritated. Summary  Lichen planus is a skin problem. It causes redness, itching, small bumps, and sores.  Do not scratch the affected areas of skin.  Take or use over-the-counter and prescription medicines only as told by your doctor.  Keep all follow-up visits as told by your doctor. This is important. This information is not intended to replace advice given to you by your health care provider. Make sure you discuss any questions you have with your health care provider. Document Revised: 04/15/2018 Document Reviewed: 04/15/2018 Elsevier Patient Education  [place].

## 2020-01-18 NOTE — Progress Notes (Signed)
Established Patient Office Visit  Subjective:  Patient ID: [name], female    DOB: Oct 04, 1956  Age: 64 y.o. MRN: [id number]  CC:  Chief Complaint  Patient presents with  . Follow-up    HPI [name] is a 64 year old female with a history of hypertension, type 2 diabetes mellitus (A1c 7.3), dyslipidemia, coronary artery disease status postDES stent (in 08/2015 completed anticoagulation with Brilinta and aspirin for 12 months, restarted on Plavix by cardiology due to multiple stents and recent occlusion from recent cardiac cath), and diastolic heart failure (EF 65%, grade 1 DD from echo of 06/2019) who presents today for a follow up visit. Her A1c is 7.3 which is improved from 8.5 in July 2020.  She endorses compliance with medications but states that she eats unhealthy foods and does not get any exercise.  Her blood pressure today is acceptable and she endorses compliance with medications.  Denies shortness of breath or chest pain.  Denies paroxysmal nocturnal dyspnea.  She states that she does notice bilateral lower extremity edema if she is on her feet all day but not otherwise. Cardiac cath from 07/2018 had revealed occlusion of the distal left circumflex artery and occlusion/to subtotally occluded diagonal vessel and medical therapy recommended due to small caliber vessel.  Next appointment with cardiology is 06/2020.  Today the patient complains of right posterior knee and right shoulder pain that have been present for a month and a half.  Denies trauma.  She rates the pain as 8/10 and describes it as a constant tight ache which limits her mobility.  She has taken acetaminophen and applied heat with minimal relief.  Pain is worse with movement and improves with rest.  She has also noticed several skin lesions that have appeared on her left lower extremity within the past few months.  Endorses pruritus.  Topical antifungal applied with no improvement.  Denies noticing lesions in any  other area of her body.  Past Medical History:  Diagnosis Date  . Anginal pain ([place])   . Coronary atherosclerosis of native coronary artery, with stent to LCX in 2006 06/04/2013  . Diabetes mellitus without complication ([place])   . GERD (gastroesophageal reflux disease)   . Heart attack ([place])   . Hyperlipidemia LDL goal < 70 06/07/2013  . Hypertension   . S/P coronary artery stent placement to RCA with PROMUS DES, 06/07/13, residual disease on OM1, OM2 and rPDA 06/07/2013    Past Surgical History:  Procedure Laterality Date  . CARDIAC CATHETERIZATION N/A 08/22/2015   Procedure: Left Heart Cath and Coronary Angiography;  Surgeon: Leonie Man, MD;  Location: [place] [facility];  Service: Cardiovascular;  Laterality: N/A;  . CARDIAC CATHETERIZATION N/A 08/22/2015   Procedure: Coronary Stent Intervention;  Surgeon: Leonie Man, MD;  Location: [place] [facility];  Service: Cardiovascular;  Laterality: N/A;  . CARDIAC CATHETERIZATION N/A 08/22/2015   Procedure: Left Heart Cath and Coronary Angiography;  Surgeon: Leonie Man, MD;  Location: [place];  Service: Cardiovascular;  Laterality: N/A;  . CORONARY STENT PLACEMENT  2006   TAXUS stent CFX in [facility]  . LEFT HEART CATH AND CORONARY ANGIOGRAPHY N/A 07/21/2018   Procedure: LEFT HEART CATH AND CORONARY ANGIOGRAPHY;  Surgeon: Troy Sine, MD;  Location: [place] [facility];  Service: Cardiovascular;  Laterality: N/A;  . LEFT HEART CATHETERIZATION WITH CORONARY ANGIOGRAM N/A 06/07/2013   Procedure: LEFT HEART CATHETERIZATION WITH CORONARY ANGIOGRAM;  Surgeon: Dani Gobble Croitoru,  MD;  Location: [place] [facility];  Service: Cardiovascular;  Laterality: N/A;    Family History  Problem Relation Age of Onset  . Diabetes Mother   . Hypertension Mother   . Diabetes Sister   . Diabetes Brother   . Hypertension Brother     Social History   Socioeconomic History  . Marital status: Divorced    Spouse name: Not on file  . Number of  children: 1  . Years of education: Not on file  . Highest education level: Not on file  Occupational History  . Occupation: Scientist, water quality  Tobacco Use  . Smoking status: Former Smoker    Years: 25.00    Quit date: 07/03/2004    Years since quitting: 15.5  . Smokeless tobacco: Never Used  Substance and Sexual Activity  . Alcohol use: Yes    Comment: occ  . Drug use: No  . Sexual activity: Not on file  Other Topics Concern  . Not on file  Social History Narrative  . Not on file   Social Determinants of Health   Financial Resource Strain:   . Difficulty of Paying Living Expenses: Not on file  Food Insecurity:   . Worried About Charity fundraiser in the Last Year: Not on file  . Ran Out of Food in the Last Year: Not on file  Transportation Needs:   . Lack of Transportation (Medical): Not on file  . Lack of Transportation (Non-Medical): Not on file  Physical Activity:   . Days of Exercise per Week: Not on file  . Minutes of Exercise per Session: Not on file  Stress:   . Feeling of Stress : Not on file  Social Connections:   . Frequency of Communication with Friends and Family: Not on file  . Frequency of Social Gatherings with Friends and Family: Not on file  . Attends Religious Services: Not on file  . Active Member of Clubs or Organizations: Not on file  . Attends Archivist Meetings: Not on file  . Marital Status: Not on file  Intimate Partner Violence:   . Fear of Current or Ex-Partner: Not on file  . Emotionally Abused: Not on file  . Physically Abused: Not on file  . Sexually Abused: Not on file    Outpatient Medications Prior to Visit  Medication Sig Dispense Refill  . acetaminophen (TYLENOL) 500 MG tablet Take 1 tablet (500 mg total) by mouth every 6 (six) hours as needed. 30 tablet 0  . aspirin EC 81 MG EC tablet Take 1 tablet (81 mg total) by mouth daily.    . cyclobenzaprine (FLEXERIL) 10 MG tablet Take 1 tablet (10 mg total) by mouth 2 (two) times  daily as needed for muscle spasms. 10 tablet 0  . furosemide (LASIX) 20 MG tablet TAKE 2 TABLETS (40 MG TOTAL) BY MOUTH DAILY. 60 tablet 0  . Insulin Pen Needle (TRUEPLUS PEN NEEDLES) 32G X 4 MM MISC Use as directed to inject victoza once daily 100 each 3  . liraglutide (VICTOZA) 18 MG/3ML SOPN Inject 0.3 mLs (1.8 mg total) into the skin daily with breakfast. Start with 0.1 mL daily for 1 week then 0.2 mL for 1 week then 0.3 mL 3 pen 6  . nitroGLYCERIN (NITROSTAT) 0.4 MG SL tablet Place 1 tablet (0.4 mg total) under the tongue every 5 (five) minutes as needed for chest pain. 25 tablet 2  . atorvastatin (LIPITOR) 80 MG tablet TAKE 1 TABLET BY MOUTH DAILY AT  6 PM 30 tablet 0  . clopidogrel (PLAVIX) 75 MG tablet TAKE 1 TABLET (75 MG TOTAL) BY MOUTH DAILY WITH BREAKFAST. 30 tablet 0  . glimepiride (AMARYL) 4 MG tablet TAKE 1 TABLET BY MOUTH 2 TIMES DAILY WITH A MEAL. 60 tablet 0  . isosorbide mononitrate (IMDUR) 30 MG 24 hr tablet Take 2 tablets (60 mg total) by mouth daily. 180 tablet 1  . metFORMIN (GLUCOPHAGE) 500 MG tablet TAKE 2 TABLETS (1,000 MG TOTAL) BY MOUTH 2 (TWO) TIMES DAILY WITH A MEAL. 240 tablet 1  . metoprolol tartrate (LOPRESSOR) 100 MG tablet Take 1 tablet (100 mg total) by mouth 2 (two) times daily. 60 tablet 0  . pantoprazole (PROTONIX) 40 MG tablet TAKE 1 TABLET (40 MG TOTAL) BY MOUTH DAILY. 30 tablet 0   No facility-administered medications prior to visit.    No Known Allergies  ROS Review of Systems  Constitutional: Negative for fatigue, fever and unexpected weight change.  HENT: Negative for congestion, rhinorrhea, sinus pressure and sinus pain.   Eyes: Negative for visual disturbance.  Respiratory: Negative for cough, chest tightness and shortness of breath.   Cardiovascular: Negative for chest pain, palpitations and leg swelling.  Gastrointestinal: Negative for abdominal distention, abdominal pain, constipation, diarrhea, nausea and vomiting.  Endocrine: Negative for  polydipsia and polyuria.  Genitourinary: Negative for decreased urine volume, difficulty urinating and dysuria.  Musculoskeletal: Positive for arthralgias and myalgias.       Right posterior knee Right shoulder  Skin: Positive for rash. Negative for color change.       Left lower extremity  Neurological: Negative for dizziness, tremors, weakness and numbness.  Hematological: Does not bruise/bleed easily.  Psychiatric/Behavioral: Negative for agitation and behavioral problems.      Objective:    Physical Exam  Constitutional: She is oriented to person, place, and time. She appears well-developed and well-nourished. No distress.  HENT:  Head: Normocephalic and atraumatic.  Right Ear: External ear normal.  Left Ear: External ear normal.  Eyes: Pupils are equal, round, and reactive to light. Conjunctivae and EOM are normal.  Cardiovascular: Normal rate, regular rhythm, normal heart sounds and intact distal pulses.  No murmur heard. Pulmonary/Chest: Effort normal and breath sounds normal. No respiratory distress. She has no wheezes.  Abdominal: Soft. Bowel sounds are normal. She exhibits no distension. There is no abdominal tenderness.  Musculoskeletal:     Right shoulder: Tenderness and pain present. No swelling. Decreased range of motion.     Right knee: Decreased range of motion. Tenderness present.     Comments: Right posterior knee tenderness  Neurological: She is alert and oriented to person, place, and time.  Skin: Skin is warm and dry. Rash noted.  Scattered circular purple plaques noted on medial and lateral aspects of left lower extremities.   Psychiatric: She has a normal mood and affect. Her behavior is normal.  Nursing note and vitals reviewed.   BP 134/85 (BP Location: Left Arm, Patient Position: Sitting, Cuff Size: Large)   Pulse 74   Temp 97.8 F (36.6 C)   Resp 18   Ht 5\' 2"  (1.575 m)   Wt 215 lb (97.5 kg)   SpO2 98%   BMI 39.32 kg/m    Health Maintenance  Due  Topic Date Due  . OPHTHALMOLOGY EXAM  11/28/1968  . COLONOSCOPY  11/28/2008  . MAMMOGRAM  06/07/2010  . PAP SMEAR-Modifier  06/08/2019  . FOOT EXAM  11/03/2019  . URINE MICROALBUMIN  02/25/2020  There are no preventive care reminders to display for this patient.  Lab Results  Component Value Date   TSH 0.997 08/27/2015   Lab Results  Component Value Date   WBC 7.0 12/31/2019   HGB 12.1 12/31/2019   HCT 37.1 12/31/2019   MCV 96.4 12/31/2019   PLT 308 12/31/2019   Lab Results  Component Value Date   NA 140 12/31/2019   K 3.5 12/31/2019   CO2 23 12/31/2019   GLUCOSE 132 (H) 12/31/2019   BUN 17 12/31/2019   CREATININE 1.10 (H) 12/31/2019   BILITOT 0.4 12/31/2019   ALKPHOS 94 12/31/2019   AST 16 12/31/2019   ALT 17 12/31/2019   PROT 7.0 12/31/2019   ALBUMIN 3.5 12/31/2019   CALCIUM 9.2 12/31/2019   ANIONGAP 12 12/31/2019   Lab Results  Component Value Date   CHOL 113 02/25/2019   Lab Results  Component Value Date   HDL 46 02/25/2019   Lab Results  Component Value Date   LDLCALC 49 02/25/2019   Lab Results  Component Value Date   TRIG 88 02/25/2019   Lab Results  Component Value Date   CHOLHDL 3.1 02/17/2018   Lab Results  Component Value Date   HGBA1C 7.3 (A) 01/18/2020      Assessment & Plan:   1. Type 2 diabetes mellitus with other circulatory complication, without long-term current use of insulin (HCC) Controlled with A1c of 7.3 Continue current medication regimen Counseled on Diabetic diet, my plate method, [id number] minutes of moderate intensity exercise/week Keep blood sugar logs with fasting goals of 80-120 mg/dl, random of less than 180 and in the event of sugars less than 60 mg/dl or greater than 400 mg/dl please notify the clinic ASAP. It is recommended that you undergo annual diabetic eye exams. Foot exam completed today. - HgB A1c - Glucose (CBG) - Microalbumin/Creatinine Ratio, Urine - Lipid panel - glimepiride (AMARYL) 4 MG  tablet; Take 2 tablets (8 mg total) by mouth daily with breakfast.  Dispense: 60 tablet; Refill: 6 - metFORMIN (GLUCOPHAGE) 500 MG tablet; Take 2 tablets (1,000 mg total) by mouth 2 (two) times daily with a meal.  Dispense: 240 tablet; Refill: 1  2. Encounter for screening colonoscopy Screening - Fecal occult blood, imunochemical([facility])  3. Breast cancer screening by mammogram Screening - MM Digital Screening; Future  4. Dyslipidemia Controlled Low cholesterol diet Continue atorvastatin - atorvastatin (LIPITOR) 80 MG tablet; Take 1 tablet (80 mg total) by mouth at bedtime.  Dispense: 30 tablet; Refill: 6  5. CAD -S/P RCA DES 08/22/15 Denies angina Encouraged risk factor modification Follow up with cardiology in 06/2020 - clopidogrel (PLAVIX) 75 MG tablet; Take 1 tablet (75 mg total) by mouth daily with breakfast.  Dispense: 30 tablet; Refill: 6  6. Chronic diastolic heart failure (HCC) EF of >65% from 06/2019 Euvolemic Followed by cardiology - isosorbide mononitrate (IMDUR) 30 MG 24 hr tablet; Take 2 tablets (60 mg total) by mouth daily.  Dispense: 180 tablet; Refill: 1  7. Essential hypertension Controlled Counseled on blood pressure goal of less than 130/80, low-sodium, DASH diet, medication compliance, 150 minutes of moderate intensity exercise per week. Discussed medication compliance, adverse effects. - metoprolol tartrate (LOPRESSOR) 100 MG tablet; Take 1 tablet (100 mg total) by mouth 2 (two) times daily.  Dispense: 60 tablet; Refill: 6  8. Adhesive capsulitis of right shoulder Acute Begin prednisone Discussed x-ray which was denied by patient at this time Consider imaging if no improvement with steroid treatment  9. Chronic pain of right knee Chronic Weight is likely a contributing factor Begin Voltaren gel  10. Lichen planus Begin triamcinolone cream   Meds ordered this encounter  Medications  . atorvastatin (LIPITOR) 80 MG tablet    Sig: Take 1  tablet (80 mg total) by mouth at bedtime.    Dispense:  30 tablet    Refill:  6  . clopidogrel (PLAVIX) 75 MG tablet    Sig: Take 1 tablet (75 mg total) by mouth daily with breakfast.    Dispense:  30 tablet    Refill:  6  . glimepiride (AMARYL) 4 MG tablet    Sig: Take 2 tablets (8 mg total) by mouth daily with breakfast.    Dispense:  60 tablet    Refill:  6  . isosorbide mononitrate (IMDUR) 30 MG 24 hr tablet    Sig: Take 2 tablets (60 mg total) by mouth daily.    Dispense:  180 tablet    Refill:  1  . metFORMIN (GLUCOPHAGE) 500 MG tablet    Sig: Take 2 tablets (1,000 mg total) by mouth 2 (two) times daily with a meal.    Dispense:  240 tablet    Refill:  1  . metoprolol tartrate (LOPRESSOR) 100 MG tablet    Sig: Take 1 tablet (100 mg total) by mouth 2 (two) times daily.    Dispense:  60 tablet    Refill:  6  . pantoprazole (PROTONIX) 40 MG tablet    Sig: Take 1 tablet (40 mg total) by mouth daily.    Dispense:  30 tablet    Refill:  6  . predniSONE (DELTASONE) 20 MG tablet    Sig: Take 1 tablet (20 mg total) by mouth 2 (two) times daily with a meal.    Dispense:  10 tablet    Refill:  0  . triamcinolone cream (KENALOG) 0.1 %    Sig: Apply 1 application topically 2 (two) times daily.    Dispense:  45 g    Refill:  1    Follow-up: Return in about 3 months (around 04/16/2020) for Chronic medical conditions.    Tomasita Morrow, RN

## 2020-01-19 LAB — LIPID PANEL
Chol/HDL Ratio: 3.2 ratio (ref 0.0–4.4)
Cholesterol, Total: 111 mg/dL (ref 100–199)
HDL: 35 mg/dL — ABNORMAL LOW (ref >39)
LDL Chol Calc (NIH): 51 mg/dL (ref 0–99)
Triglycerides: 146 mg/dL (ref 0–149)
VLDL Cholesterol Cal: 25 mg/dL (ref 5–40)

## 2020-01-19 MED FILL — DICLOFENAC SODIUM 1% GEL: 1 | 6 days supply | Qty: 100 | Fill #0

## 2020-01-20 ENCOUNTER — Telehealth: Payer: Self-pay

## 2020-01-20 NOTE — Telephone Encounter (Signed)
-----   Message from [name], MD sent at 01/20/2020  1:01 PM EST ----- Please inform the patient that labs are normal. Thank you.

## 2020-01-20 NOTE — Telephone Encounter (Signed)
Patient name and DOB has been verified Patient was informed of lab results. Patient had no questions.  

## 2020-02-11 MED FILL — VICTOZA 18 MG/3 ML INJECT P: 18 | 30 days supply | Qty: 9 | Fill #4

## 2020-02-15 ENCOUNTER — Other Ambulatory Visit: Payer: Self-pay | Admitting: Family Medicine

## 2020-02-15 DIAGNOSIS — I5032 Chronic diastolic (congestive) heart failure: Secondary | ICD-10-CM

## 2020-02-15 MED FILL — ATORVASTATIN 80 MG TABLET: 80 | 30 days supply | Qty: 30 | Fill #0

## 2020-02-15 MED FILL — GLIMEPIRIDE 4 MG TABS: 4 | 30 days supply | Qty: 60 | Fill #0

## 2020-02-15 MED FILL — METOPROLOL TARTRATE 100 MG: 100 | 30 days supply | Qty: 60 | Fill #3

## 2020-02-15 MED FILL — ISOSORBIDE MN ER 30 MG TAB: 30 | 30 days supply | Qty: 60 | Fill #5

## 2020-02-15 MED FILL — PANTOPRAZOLE SOD DR 40 MG T: 40 | 30 days supply | Qty: 30 | Fill #0

## 2020-02-15 MED FILL — CLOPIDOGREL 75 MG TABLET: 75 | 30 days supply | Qty: 30 | Fill #0

## 2020-02-15 MED FILL — metFORMIN HCL 500 MG TABS: 500 | 30 days supply | Qty: 120 | Fill #1

## 2020-02-16 ENCOUNTER — Ambulatory Visit: Payer: Self-pay | Admitting: Family Medicine

## 2020-02-16 MED FILL — FUROSEMIDE 20 MG TABS: 20 | 30 days supply | Qty: 60 | Fill #0

## 2020-04-05 MED FILL — METOPROLOL TARTRATE 100 MG: 100 | 30 days supply | Qty: 60 | Fill #4

## 2020-04-05 MED FILL — ISOSORBIDE MN ER 30 MG TAB: 30 | 30 days supply | Qty: 60 | Fill #0

## 2020-04-05 MED FILL — FUROSEMIDE 20 MG TABS: 20 | 30 days supply | Qty: 60 | Fill #1

## 2020-04-05 MED FILL — ATORVASTATIN 80 MG TABLET: 80 | 30 days supply | Qty: 30 | Fill #1

## 2020-04-05 MED FILL — PANTOPRAZOLE SOD DR 40 MG T: 40 | 30 days supply | Qty: 30 | Fill #1

## 2020-04-05 MED FILL — GLIMEPIRIDE 4 MG TABS: 4 | 30 days supply | Qty: 60 | Fill #1

## 2020-04-05 MED FILL — metFORMIN HCL 500 MG TABS: 500 | 30 days supply | Qty: 120 | Fill #2

## 2020-04-05 MED FILL — CLOPIDOGREL 75 MG TABLET: 75 | 30 days supply | Qty: 30 | Fill #1

## 2020-04-12 ENCOUNTER — Ambulatory Visit: Payer: Self-pay | Admitting: Pharmacist

## 2020-04-18 ENCOUNTER — Encounter: Payer: Self-pay | Admitting: Family Medicine

## 2020-04-18 ENCOUNTER — Other Ambulatory Visit: Payer: Self-pay

## 2020-04-18 ENCOUNTER — Ambulatory Visit: Payer: Self-pay | Attending: Family Medicine | Admitting: Family Medicine

## 2020-04-18 VITALS — BP 145/85 | HR 74 | Ht 62.0 in | Wt 225.0 lb

## 2020-04-18 DIAGNOSIS — G8929 Other chronic pain: Secondary | ICD-10-CM

## 2020-04-18 DIAGNOSIS — M25562 Pain in left knee: Secondary | ICD-10-CM

## 2020-04-18 DIAGNOSIS — M25561 Pain in right knee: Secondary | ICD-10-CM

## 2020-04-18 DIAGNOSIS — L439 Lichen planus, unspecified: Secondary | ICD-10-CM

## 2020-04-18 DIAGNOSIS — M25511 Pain in right shoulder: Secondary | ICD-10-CM

## 2020-04-18 DIAGNOSIS — E1159 Type 2 diabetes mellitus with other circulatory complications: Secondary | ICD-10-CM

## 2020-04-18 LAB — POCT GLYCOSYLATED HEMOGLOBIN (HGB A1C): HbA1c, POC (controlled diabetic range): 8 % — AB (ref 0.0–7.0)

## 2020-04-18 LAB — GLUCOSE, POCT (MANUAL RESULT ENTRY): POC Glucose: 177 mg/dl — AB (ref 70–99)

## 2020-04-18 MED ORDER — TRIAMCINOLONE ACETONIDE 0.1 % EX CREA: 1.0000 "application " | TOPICAL_CREAM | Freq: Two times a day (BID) | CUTANEOUS | 1 refills | Status: DC

## 2020-04-18 MED ORDER — TRULICITY 1.5 MG/0.5ML ~~LOC~~ SOAJ: 1.5000 mg | SUBCUTANEOUS | 3 refills | Status: DC

## 2020-04-18 MED FILL — TRIAMCINOLONE ACETONIDE 0.1: 0.1 | 7 days supply | Qty: 45 | Fill #0

## 2020-04-18 MED FILL — TRULICITY 1.5 MG/0.5 ML PEN: 1.5 | 28 days supply | Qty: 2 | Fill #0

## 2020-04-18 NOTE — Progress Notes (Signed)
Subjective:  Patient ID: [name], female    DOB: Jul 05, 1956  Age: 64 y.o. MRN: [id number]  CC: Diabetes   HPI [name] is a 64 year old female with a history of type 2 diabetes mellitus (A1c 8.0), dyslipidemia, coronary artery disease status post DES stent (in 08/2015 completed anticoagulation with Brilinta and aspirin for 12 months, restarted on Plavix by cardiology due to multiple stents and occlusion from recent cardiac cath), diastolic heart failure (EF> 65%, LVH from 06/2019), essential hypertension  She has pain in her knees which has progressed to her legs with associsted leg weakness since her last OV. She has to sit every now and then at the grocery store due to pain. R shoulder also hurts and is worse when she lies on it and she has noticed some restriction in abduction  A1c is 8.0 up from 7.3. She is stress eating she states and has gained 10lbs in the last 3 months. She would like a once/week injection rather than Victoza which she takes daily.  She has a left leg rash which is not itchy but has been present for 1 year. Had a similar rash 3 years back on her left leg  and this has now healed. She has no chest pain, dyspnea.  Past Medical History:  Diagnosis Date  . Anginal pain ([place])   . Coronary atherosclerosis of native coronary artery, with stent to LCX in 2006 06/04/2013  . Diabetes mellitus without complication ([place])   . GERD (gastroesophageal reflux disease)   . Heart attack ([place])   . Hyperlipidemia LDL goal < 70 06/07/2013  . Hypertension   . S/P coronary artery stent placement to RCA with PROMUS DES, 06/07/13, residual disease on OM1, OM2 and rPDA 06/07/2013    Past Surgical History:  Procedure Laterality Date  . CARDIAC CATHETERIZATION N/A 08/22/2015   Procedure: Left Heart Cath and Coronary Angiography;  Surgeon: Leonie Man, MD;  Location: [facility];  Service: Cardiovascular;  Laterality: N/A;  . CARDIAC CATHETERIZATION N/A 08/22/2015   Procedure: Coronary Stent Intervention;  Surgeon: Leonie Man, MD;  Location: [facility];  Service: Cardiovascular;  Laterality: N/A;  . CARDIAC CATHETERIZATION N/A 08/22/2015   Procedure: Left Heart Cath and Coronary Angiography;  Surgeon: Leonie Man, MD;  Location: [facility];  Service: Cardiovascular;  Laterality: N/A;  . CORONARY STENT PLACEMENT  2006   TAXUS stent CFX in [facility]  . LEFT HEART CATH AND CORONARY ANGIOGRAPHY N/A 07/21/2018   Procedure: LEFT HEART CATH AND CORONARY ANGIOGRAPHY;  Surgeon: Troy Sine, MD;  Location: [facility];  Service: Cardiovascular;  Laterality: N/A;  . LEFT HEART CATHETERIZATION WITH CORONARY ANGIOGRAM N/A 06/07/2013   Procedure: LEFT HEART CATHETERIZATION WITH CORONARY ANGIOGRAM;  Surgeon: Sanda Klein, MD;  Location: [facility];  Service: Cardiovascular;  Laterality: N/A;    Family History  Problem Relation Age of Onset  . Diabetes Mother   . Hypertension Mother   . Diabetes Sister   . Diabetes Brother   . Hypertension Brother     No Known Allergies  Outpatient Medications Prior to Visit  Medication Sig Dispense Refill  . acetaminophen (TYLENOL) 500 MG tablet Take 1 tablet (500 mg total) by mouth every 6 (six) hours as needed. 30 tablet 0  . aspirin EC 81 MG EC tablet Take 1 tablet (81 mg total) by mouth daily.    Marland Kitchen atorvastatin (LIPITOR) 80 MG tablet Take 1 tablet (  80 mg total) by mouth at bedtime. 30 tablet 6  . clopidogrel (PLAVIX) 75 MG tablet Take 1 tablet (75 mg total) by mouth daily with breakfast. 30 tablet 6  . cyclobenzaprine (FLEXERIL) 10 MG tablet Take 1 tablet (10 mg total) by mouth 2 (two) times daily as needed for muscle spasms. 10 tablet 0  . diclofenac Sodium (VOLTAREN) 1 % GEL Apply 4 g topically 4 (four) times daily. 100 g 1  . furosemide (LASIX) 20 MG tablet TAKE 2 TABLETS (40 MG TOTAL) BY MOUTH DAILY. 60 tablet 2  . glimepiride (AMARYL) 4 MG tablet Take 2 tablets (8 mg total) by mouth  daily with breakfast. 60 tablet 6  . Insulin Pen Needle (TRUEPLUS PEN NEEDLES) 32G X 4 MM MISC Use as directed to inject victoza once daily 100 each 3  . isosorbide mononitrate (IMDUR) 30 MG 24 hr tablet Take 2 tablets (60 mg total) by mouth daily. 180 tablet 1  . liraglutide (VICTOZA) 18 MG/3ML SOPN Inject 0.3 mLs (1.8 mg total) into the skin daily with breakfast. Start with 0.1 mL daily for 1 week then 0.2 mL for 1 week then 0.3 mL 3 pen 6  . metFORMIN (GLUCOPHAGE) 500 MG tablet Take 2 tablets (1,000 mg total) by mouth 2 (two) times daily with a meal. 240 tablet 1  . metoprolol tartrate (LOPRESSOR) 100 MG tablet Take 1 tablet (100 mg total) by mouth 2 (two) times daily. 60 tablet 6  . nitroGLYCERIN (NITROSTAT) 0.4 MG SL tablet Place 1 tablet (0.4 mg total) under the tongue every 5 (five) minutes as needed for chest pain. 25 tablet 2  . pantoprazole (PROTONIX) 40 MG tablet Take 1 tablet (40 mg total) by mouth daily. 30 tablet 6  . triamcinolone cream (KENALOG) 0.1 % Apply 1 application topically 2 (two) times daily. 45 g 1  . predniSONE (DELTASONE) 20 MG tablet Take 1 tablet (20 mg total) by mouth 2 (two) times daily with a meal. (Patient not taking: Reported on 04/18/2020) 10 tablet 0   No facility-administered medications prior to visit.     ROS Review of Systems  Constitutional: Negative for activity change, appetite change and fatigue.  HENT: Negative for congestion, sinus pressure and sore throat.   Eyes: Negative for visual disturbance.  Respiratory: Negative for cough, chest tightness, shortness of breath and wheezing.   Cardiovascular: Negative for chest pain and palpitations.  Gastrointestinal: Negative for abdominal distention, abdominal pain and constipation.  Endocrine: Negative for polydipsia.  Genitourinary: Negative for dysuria and frequency.  Musculoskeletal: Negative for arthralgias and back pain.  Skin: Positive for rash.  Neurological: Negative for tremors,  light-headedness and numbness.  Hematological: Does not bruise/bleed easily.  Psychiatric/Behavioral: Negative for agitation and behavioral problems.    Objective:  BP (!) 145/85   Pulse 74   Ht 5\' 2"  (1.575 m)   Wt 225 lb (102.1 kg)   SpO2 99%   BMI 41.15 kg/m   BP/Weight 04/18/2020 01/18/2020 [id number]  Systolic BP [id number]  Diastolic BP 85 85 82  Wt. (Lbs) 225 215 225  BMI 41.15 39.32 41.15      Physical Exam Constitutional:      Appearance: She is well-developed. She is obese.  Neck:     Vascular: No JVD.  Cardiovascular:     Rate and Rhythm: Normal rate.     Heart sounds: Normal heart sounds. No murmur.  Pulmonary:     Effort: Pulmonary effort is normal.  Breath sounds: Normal breath sounds. No wheezing or rales.  Chest:     Chest wall: No tenderness.  Abdominal:     General: Bowel sounds are normal. There is no distension.     Palpations: Abdomen is soft. There is no mass.     Tenderness: There is no abdominal tenderness.  Musculoskeletal:        General: Normal range of motion.     Right lower leg: No edema.     Left lower leg: No edema.  Neurological:     Mental Status: She is alert and oriented to person, place, and time.  Psychiatric:        Mood and Affect: Mood normal.        CMP Latest Ref Rng & Units 12/31/2019 06/20/2019 06/19/2019  Glucose 70 - 99 mg/dL 132(H) 281(H) 218(H)  BUN 8 - 23 mg/dL 17 41(H) 49(H)  Creatinine 0.44 - 1.00 mg/dL 1.10(H) 1.33(H) 2.84(H)  Sodium 135 - 145 mmol/L 140 139 138  Potassium 3.5 - 5.1 mmol/L 3.5 4.2 4.6  Chloride 98 - 111 mmol/L 105 105 99  CO2 22 - 32 mmol/L 23 26 24   Calcium 8.9 - 10.3 mg/dL 9.2 8.8(L) 9.9  Total Protein 6.5 - 8.1 g/dL 7.0 - 6.5  Total Bilirubin 0.3 - 1.2 mg/dL 0.4 - 0.5  Alkaline Phos 38 - 126 U/L 94 - 74  AST 15 - 41 U/L 16 - 18  ALT 0 - 44 U/L 17 - 20    Lipid Panel     Component Value Date/Time   CHOL 111 01/18/2020 1050   TRIG 146 01/18/2020 1050   HDL 35 (L) 01/18/2020 1050    CHOLHDL 3.2 01/18/2020 1050   CHOLHDL 2.4 02/11/2017 1002   VLDL 18 02/11/2017 1002   LDLCALC 51 01/18/2020 1050    CBC    Component Value Date/Time   WBC 7.0 12/31/2019 1930   RBC 3.85 (L) 12/31/2019 1930   HGB 12.1 12/31/2019 1930   HCT 37.1 12/31/2019 1930   PLT 308 12/31/2019 1930   MCV 96.4 12/31/2019 1930   MCH 31.4 12/31/2019 1930   MCHC 32.6 12/31/2019 1930   RDW 13.8 12/31/2019 1930   LYMPHSABS 2.8 12/31/2019 1930   MONOABS 0.6 12/31/2019 1930   EOSABS 0.2 12/31/2019 1930   BASOSABS 0.0 12/31/2019 1930    Lab Results  Component Value Date   HGBA1C 8.0 (A) 04/18/2020    Assessment & Plan:    1. Type 2 diabetes mellitus with other circulatory complication, without long-term current use of insulin (HCC) Uncontrolled with A1c of 8.0; goal is less than 7 She has been noncompliant with a diabetic diet Discontinue Victoza and commenced Trulicity per request Counseled on Diabetic diet, my plate method, [id number] minutes of moderate intensity exercise/week Blood sugar logs with fasting goals of 80-120 mg/dl, random of less than 180 and in the event of sugars less than 60 mg/dl or greater than 400 mg/dl encouraged to notify the clinic. Advised on the need for annual eye exams, annual foot exams, Pneumonia vaccine. - POCT glucose (manual entry) - POCT glycosylated hemoglobin (Hb A1C) - Dulaglutide (TRULICITY) 1.5 0000000 SOPN; Inject 1.5 mg into the skin once a week.  Dispense: 4 pen; Refill: 3 - Basic Metabolic Panel  2. Bilateral chronic knee pain Likely underlying osteoarthritis Weight loss will be beneficial Advised to continue diclofenac gel; will refer to PT - DG Knee Complete 4 Views Right; Future - Ambulatory referral to Physical Therapy -  DG Knee Complete 4 Views Left; Future  3. Chronic right shoulder pain Could be underlying evaluate adhesive capsulitis versus osteoarthritis - Ambulatory referral to Physical Therapy  4. Lichen planus Placed on topical  steroid Due to recurrent nature will refer to dermatology - Ambulatory referral to Dermatology - triamcinolone cream (KENALOG) 0.1 %; Apply 1 application topically 2 (two) times daily.  Dispense: 45 g; Refill: 1  5. Morbid obesity ([place]) Advised to work on weight loss, reducing portion sizes      Elizabeth Rakes, MD, FAAFP. [facility] and [facility], [place]   04/18/2020, 9:45 AM

## 2020-04-18 NOTE — Progress Notes (Signed)
Having pain in both legs from knee down.  Wants to be checked for MS due to leg pain.  Wants to discuss insulin.

## 2020-04-19 LAB — BASIC METABOLIC PANEL
BUN/Creatinine Ratio: 23 (ref 12–28)
BUN: 24 mg/dL (ref 8–27)
CO2: 20 mmol/L (ref 20–29)
Calcium: 9.4 mg/dL (ref 8.7–10.3)
Chloride: 104 mmol/L (ref 96–106)
Creatinine, Ser: 1.05 mg/dL — ABNORMAL HIGH (ref 0.57–1.00)
GFR calc Af Amer: 66 mL/min/{1.73_m2} (ref >59)
GFR calc non Af Amer: 57 mL/min/{1.73_m2} — ABNORMAL LOW (ref >59)
Glucose: 286 mg/dL — ABNORMAL HIGH (ref 65–99)
Potassium: 4.7 mmol/L (ref 3.5–5.2)
Sodium: 140 mmol/L (ref 134–144)

## 2020-04-19 MED ORDER — DICLOFENAC SODIUM 1 % EX GEL: 4.0000 g | Freq: Four times a day (QID) | CUTANEOUS | 1 refills | Status: DC

## 2020-04-19 MED FILL — DICLOFENAC SODIUM 1% GEL: 1 | 6 days supply | Qty: 100 | Fill #0

## 2020-04-26 ENCOUNTER — Other Ambulatory Visit: Payer: Self-pay

## 2020-04-26 ENCOUNTER — Emergency Department (HOSPITAL_COMMUNITY): Payer: Self-pay

## 2020-04-26 ENCOUNTER — Emergency Department (HOSPITAL_COMMUNITY)
Admission: EM | Admit: 2020-04-26 | Discharge: 2020-04-26 | Disposition: A | Discharge status: Home / Self Care | Payer: Self-pay | Attending: Emergency Medicine | Admitting: Emergency Medicine

## 2020-04-26 DIAGNOSIS — Z79899 Other long term (current) drug therapy: Secondary | ICD-10-CM | POA: Insufficient documentation

## 2020-04-26 DIAGNOSIS — E119 Type 2 diabetes mellitus without complications: Secondary | ICD-10-CM | POA: Insufficient documentation

## 2020-04-26 DIAGNOSIS — M170 Bilateral primary osteoarthritis of knee: Secondary | ICD-10-CM | POA: Insufficient documentation

## 2020-04-26 DIAGNOSIS — I252 Old myocardial infarction: Secondary | ICD-10-CM | POA: Insufficient documentation

## 2020-04-26 DIAGNOSIS — Z87891 Personal history of nicotine dependence: Secondary | ICD-10-CM | POA: Insufficient documentation

## 2020-04-26 DIAGNOSIS — I10 Essential (primary) hypertension: Secondary | ICD-10-CM | POA: Insufficient documentation

## 2020-04-26 DIAGNOSIS — Z794 Long term (current) use of insulin: Secondary | ICD-10-CM | POA: Insufficient documentation

## 2020-04-26 DIAGNOSIS — I2510 Atherosclerotic heart disease of native coronary artery without angina pectoris: Secondary | ICD-10-CM | POA: Insufficient documentation

## 2020-04-26 DIAGNOSIS — Z955 Presence of coronary angioplasty implant and graft: Secondary | ICD-10-CM | POA: Insufficient documentation

## 2020-04-26 MED ORDER — KETOROLAC TROMETHAMINE 60 MG/2ML IM SOLN
60.0000 mg | Freq: Once | INTRAMUSCULAR | Status: AC
  Administered 2020-04-26: 60 mg via INTRAMUSCULAR
  Filled 2020-04-26: qty 2

## 2020-04-26 NOTE — ED Triage Notes (Signed)
Pt here for xrays of her legs due to bil leg pain for several months. States pcp sent her here for imaging. Denies injury.

## 2020-04-26 NOTE — ED Provider Notes (Signed)
[name]   CSN: [id number] Arrival date & time: 04/26/20  1242     History Chief Complaint  Patient presents with  . Leg Pain    [name] is a 64 y.o. female.  64 y/o obese female with hx type 2 diabetes, HTN, CAD, CHF, osteoarthritis presenting to the ER for bilateral knee pain for 1-2 months. Saw PMD for the same and had outpatient xrays ordered but decided to come to the ER. She reports pain is worse in the morning and with walking. Improved with tylenol arthritis and Voltaren gel. Denies leg swelling, SOB, numbness, tingling, weakness.         Past Medical History:  Diagnosis Date  . Anginal pain ([place])   . Coronary atherosclerosis of native coronary artery, with stent to LCX in 2006 06/04/2013  . Diabetes mellitus without complication ([place])   . GERD (gastroesophageal reflux disease)   . Heart attack ([place])   . Hyperlipidemia LDL goal < 70 06/07/2013  . Hypertension   . S/P coronary artery stent placement to RCA with PROMUS DES, 06/07/13, residual disease on OM1, OM2 and rPDA 06/07/2013    Patient Active Problem List   Diagnosis Date Noted  . Diabetes mellitus type 2 in obese ([place]) 06/25/2019  . Angina pectoris ([name]) 06/19/2019  . Viral gastroenteritis 06/19/2019  . AKI (acute kidney injury) ([place]) 06/19/2019  . Coronary artery disease involving native coronary artery of native heart with angina pectoris ([place])   . Osteoarthritis of both hands 05/01/2016  . Chronic diastolic CHF (congestive heart failure) ([place]) 12/06/2015  . Non-compliance with treatment-(cost) 08/24/2015  . Hypertensive cardiovascular disease 08/24/2015  . Dyslipidemia 08/24/2015  . CAD -S/P RCA DES 08/22/15   . NSTEMI (non-ST elevated myocardial infarction) ([place]) 08/18/2015  . Morbid obesity ([place]) 06/15/2013  . Type 2 diabetes mellitus, uncontrolled ([place]) 06/04/2013  . Essential hypertension 06/04/2013    Past Surgical History:  Procedure  Laterality Date  . CARDIAC CATHETERIZATION N/A 08/22/2015   Procedure: Left Heart Cath and Coronary Angiography;  Surgeon: Leonie Man, MD;  Location: [facility];  Service: Cardiovascular;  Laterality: N/A;  . CARDIAC CATHETERIZATION N/A 08/22/2015   Procedure: Coronary Stent Intervention;  Surgeon: Leonie Man, MD;  Location: [facility];  Service: Cardiovascular;  Laterality: N/A;  . CARDIAC CATHETERIZATION N/A 08/22/2015   Procedure: Left Heart Cath and Coronary Angiography;  Surgeon: Leonie Man, MD;  Location: [facility];  Service: Cardiovascular;  Laterality: N/A;  . CORONARY STENT PLACEMENT  2006   TAXUS stent CFX in [facility]  . LEFT HEART CATH AND CORONARY ANGIOGRAPHY N/A 07/21/2018   Procedure: LEFT HEART CATH AND CORONARY ANGIOGRAPHY;  Surgeon: Troy Sine, MD;  Location: [facility];  Service: Cardiovascular;  Laterality: N/A;  . LEFT HEART CATHETERIZATION WITH CORONARY ANGIOGRAM N/A 06/07/2013   Procedure: LEFT HEART CATHETERIZATION WITH CORONARY ANGIOGRAM;  Surgeon: Sanda Klein, MD;  Location: [facility];  Service: Cardiovascular;  Laterality: N/A;     OB History   No obstetric history on file.     Family History  Problem Relation Age of Onset  . Diabetes Mother   . Hypertension Mother   . Diabetes Sister   . Diabetes Brother   . Hypertension Brother     Social History   Tobacco Use  . Smoking status: Former Smoker    Years: 25.00    Quit date: 07/03/2004  Years since quitting: 15.8  . Smokeless tobacco: Never Used  Substance Use Topics  . Alcohol use: Yes    Comment: occ  . Drug use: No    Home Medications Prior to Admission medications   Medication Sig Start Date End Date Taking? Authorizing Provider  acetaminophen (TYLENOL) 500 MG tablet Take 1 tablet (500 mg total) by mouth every 6 (six) hours as needed. 12/31/19   Rodell Perna A, PA-C  aspirin EC 81 MG EC tablet Take 1 tablet (81 mg total) by mouth daily. 08/24/15    Erlene Quan, PA-C  atorvastatin (LIPITOR) 80 MG tablet Take 1 tablet (80 mg total) by mouth at bedtime. 01/18/20   [name], MD  clopidogrel (PLAVIX) 75 MG tablet Take 1 tablet (75 mg total) by mouth daily with breakfast. 01/18/20   [name], MD  cyclobenzaprine (FLEXERIL) 10 MG tablet Take 1 tablet (10 mg total) by mouth 2 (two) times daily as needed for muscle spasms. 12/31/19   Fawze, Mina A, PA-C  diclofenac Sodium (VOLTAREN) 1 % GEL Apply 4 g topically 4 (four) times daily. 04/19/20   [name], MD  Dulaglutide (TRULICITY) 1.5 0000000 SOPN Inject 1.5 mg into the skin once a week. 04/18/20   [name], MD  furosemide (LASIX) 20 MG tablet TAKE 2 TABLETS (40 MG TOTAL) BY MOUTH DAILY. 02/15/20   [name], MD  glimepiride (AMARYL) 4 MG tablet Take 2 tablets (8 mg total) by mouth daily with breakfast. 01/18/20   [name], MD  Insulin Pen Needle (TRUEPLUS PEN NEEDLES) 32G X 4 MM MISC Use as directed to inject victoza once daily 08/20/18   [name], MD  isosorbide mononitrate (IMDUR) 30 MG 24 hr tablet Take 2 tablets (60 mg total) by mouth daily. 01/18/20   [name], MD  metFORMIN (GLUCOPHAGE) 500 MG tablet Take 2 tablets (1,000 mg total) by mouth 2 (two) times daily with a meal. 01/18/20   [name], MD  metoprolol tartrate (LOPRESSOR) 100 MG tablet Take 1 tablet (100 mg total) by mouth 2 (two) times daily. 01/18/20   [name], MD  nitroGLYCERIN (NITROSTAT) 0.4 MG SL tablet Place 1 tablet (0.4 mg total) under the tongue every 5 (five) minutes as needed for chest pain. 08/24/15   Erlene Quan, PA-C  pantoprazole (PROTONIX) 40 MG tablet Take 1 tablet (40 mg total) by mouth daily. 01/18/20   [name], MD  triamcinolone cream (KENALOG) 0.1 % Apply 1 application topically 2 (two) times daily. 04/18/20   [name], MD    Allergies    Patient has no known allergies.  Review of Systems   Review of Systems  Constitutional: Negative for chills  and fever.  Respiratory: Negative for cough and shortness of breath.   Cardiovascular: Negative for chest pain and leg swelling.  Gastrointestinal: Negative for nausea and vomiting.  Musculoskeletal: Positive for arthralgias and gait problem. Negative for joint swelling, neck pain and neck stiffness.  Skin: Negative for rash and wound.  Neurological: Negative for light-headedness.  Hematological: Does not bruise/bleed easily.    Physical Exam Updated Vital Signs BP (!) 157/78 (BP Location: Left Arm)   Pulse 69   Temp 98.1 F (36.7 C) (Oral)   Resp 14   Ht 5\' 2"  (1.575 m)   Wt 102.1 kg   SpO2 96%   BMI 41.15 kg/m   Physical Exam Vitals and nursing [name] reviewed.  Constitutional:      General: She is not in acute  distress.    Appearance: Normal appearance. She is obese. She is not ill-appearing, toxic-appearing or diaphoretic.  HENT:     Head: Normocephalic.     Mouth/Throat:     Mouth: Mucous membranes are moist.  Eyes:     Conjunctiva/sclera: Conjunctivae normal.  Pulmonary:     Effort: Pulmonary effort is normal.  Musculoskeletal:     Right lower leg: No edema.     Left lower leg: No edema.     Comments: Bilateral knees with crepitus and pain with extension. No effusion or calf swelling. Exam somewhat limited due to body habitus  Skin:    General: Skin is dry.  Neurological:     General: No focal deficit present.     Mental Status: She is alert.     Cranial Nerves: No cranial nerve deficit.     Sensory: No sensory deficit.     Motor: No weakness.  Psychiatric:        Mood and Affect: Mood normal.     ED Results / Procedures / Treatments   Labs (all labs ordered are listed, but only abnormal results are displayed) Labs Reviewed - No data to display  EKG None  Radiology DG Knee Complete 4 Views Left  Result Date: 04/26/2020 CLINICAL DATA:  Acute bilateral knee pain without known injury. EXAM: LEFT KNEE - COMPLETE 4+ VIEW COMPARISON:  None. FINDINGS: No  evidence of fracture, dislocation, or joint effusion. Mild narrowing of medial joint space is noted. Soft tissues are unremarkable. IMPRESSION: Mild degenerative joint disease is noted medially. No acute abnormality seen in the left knee. Electronically Signed   By: Marijo Conception M.D.   On: 04/26/2020 14:44   DG Knee Complete 4 Views Right  Result Date: 04/26/2020 CLINICAL DATA:  Acute bilateral knee pain without known injury. EXAM: RIGHT KNEE - COMPLETE 4+ VIEW COMPARISON:  None. FINDINGS: No evidence of fracture, dislocation, or joint effusion. Mild narrowing of the medial and lateral joint spaces are noted. Soft tissues are unremarkable. IMPRESSION: Mild degenerative joint disease. No acute abnormality seen in the right knee. Electronically Signed   By: Marijo Conception M.D.   On: 04/26/2020 14:45    Procedures Procedures (including critical care time)  Medications Ordered in ED Medications  ketorolac (TORADOL) injection 60 mg (has no administration in time range)    ED Course  I have reviewed the triage vital signs and the nursing notes.  Pertinent labs & imaging results that were available during my care of the patient were reviewed by me and considered in my medical decision making (see chart for details).  Clinical Course as of Apr 26 1452  Wed Apr 26, 2020  1444 Obese patient with bilateral knee pain for 1-2 months. Pain consistent with arthritis and xray demonstrate the same. No concern for DVT or other emergent process. Patient is on multiple medications for multiple problems. Advised to continue tylenol, topical NSAID and referral to ortho made. Weight loss encouraged. Advised on return precautions.    [KM]    Clinical Course User Index [KM] Kristine Royal   MDM Rules/Calculators/A&P                      Based on review of vitals, medical screening exam, lab work and/or imaging, there does not appear to be an acute, emergent etiology for the patient's symptoms.  Counseled pt on good return precautions and encouraged both PCP and ED follow-up as needed.  Prior to discharge, I also discussed incidental imaging findings with patient in detail and advised appropriate, recommended follow-up in detail.  Clinical Impression: 1. Osteoarthritis of both knees, unspecified osteoarthritis type     Disposition: Discharge  Prior to providing a prescription for a controlled substance, I independently reviewed the patient's recent prescription history on the [facility]. The patient had no recent or regular prescriptions and was deemed appropriate for a brief, less than 3 day prescription of narcotic for acute analgesia.  This [name] was prepared with assistance of Systems analyst. Occasional wrong-word or sound-a-like substitutions may have occurred due to the inherent limitations of voice recognition software.  Final Clinical Impression(s) / ED Diagnoses Final diagnoses:  Osteoarthritis of both knees, unspecified osteoarthritis type    Rx / DC Orders ED Discharge Orders    None       Kristine Royal 04/26/20 1453    Fredia Sorrow, MD 04/29/20 [phone]

## 2020-04-26 NOTE — Discharge Instructions (Addendum)
You were seen today for bilateral knee pain. You have some arthritis in both of your knees. Please continue to take tylenol and apply voltaren gel. Apply ice for 20 minutes at a time up to 3 times daily. I have given you a referral for orthopedics where they may be able to treat you further with things like injections or physical therapy. Weight loss is always encouraged to help with pain. Thank you for allowing me to care for you today. Please return to the emergency department if you have new or worsening symptoms. Take your medications as instructed.

## 2020-05-12 MED FILL — METFORMIN HCL 500 MG TABS: 500 | 30 days supply | Qty: 120 | Fill #3

## 2020-05-12 MED FILL — ?PANTOPRAZOLE SO DR 40MG TA: 40 | 30 days supply | Qty: 30 | Fill #2

## 2020-05-12 MED FILL — ATORVASTATIN 80 MG TABLET: 80 | 30 days supply | Qty: 30 | Fill #2

## 2020-05-12 MED FILL — ?METOPROLOL TART 100 MG TAB: 100 | 30 days supply | Qty: 60 | Fill #5

## 2020-05-12 MED FILL — TRULICITY 1.5 MG/0.5 ML PEN: 1.5 | 28 days supply | Qty: 2 | Fill #1

## 2020-05-12 MED FILL — ?CLOPIDOGREL 75MG TA: 75 | 30 days supply | Qty: 30 | Fill #2

## 2020-05-12 MED FILL — FUROSEMIDE 20 MG TABS: 20 | 30 days supply | Qty: 60 | Fill #2

## 2020-05-12 MED FILL — ISOSORBIDE MN ER 30 MG TAB: 30 | 30 days supply | Qty: 60 | Fill #1

## 2020-05-12 MED FILL — GLIMEPIRIDE 4 MG TABS: 4 | 30 days supply | Qty: 60 | Fill #2

## 2020-06-16 MED FILL — TRULICITY 1.5 MG/0.5 ML PEN: 1.5 | 28 days supply | Qty: 2 | Fill #2

## 2020-07-10 ENCOUNTER — Other Ambulatory Visit: Payer: Self-pay | Admitting: Family Medicine

## 2020-07-10 DIAGNOSIS — I5032 Chronic diastolic (congestive) heart failure: Secondary | ICD-10-CM

## 2020-07-10 MED FILL — GLIMEPIRIDE 4 MG TABS: 4 | 30 days supply | Qty: 60 | Fill #3

## 2020-07-10 MED FILL — ?PANTOPRAZOLE SODI DR 40MGT: 40 | 30 days supply | Qty: 30 | Fill #3

## 2020-07-10 MED FILL — ISOSORBIDE MN ER 30 MG TAB: 30 | 30 days supply | Qty: 60 | Fill #2

## 2020-07-10 MED FILL — ATORVASTATIN 80 MG TABLET: 80 | 30 days supply | Qty: 30 | Fill #3

## 2020-07-10 MED FILL — FUROSEMIDE 20 MG TABS: 20 | 30 days supply | Qty: 60 | Fill #0

## 2020-07-10 MED FILL — ?METOPROLOL TART 100 MG TAB: 100 | 30 days supply | Qty: 60 | Fill #6

## 2020-07-10 MED FILL — METFORMIN HCL 500 MG TABS: 500 | 30 days supply | Qty: 120 | Fill #0

## 2020-07-10 MED FILL — ?CLOPIDOGREL 75MG TA: 75 | 30 days supply | Qty: 30 | Fill #3

## 2020-08-28 MED FILL — ISOSORBIDE MN ER 30 MG TAB: 30 | 30 days supply | Qty: 60 | Fill #3

## 2020-08-28 MED FILL — GLIMEPIRIDE 4 MG TABS: 4 | 30 days supply | Qty: 60 | Fill #4

## 2020-08-28 MED FILL — PANTOPRAZOLE SOD DR 40 MG T: 40 | 30 days supply | Qty: 30 | Fill #4

## 2020-08-28 MED FILL — ?METOPROLOL TART 100 MG TAB: 100 | 30 days supply | Qty: 60 | Fill #0

## 2020-08-28 MED FILL — ATORVASTATIN CALCIUM 80 MG: 80 | 30 days supply | Qty: 30 | Fill #4

## 2020-08-28 MED FILL — ?FUROSEMIDE 20MG TABLET: 20 | 30 days supply | Qty: 60 | Fill #1

## 2020-08-28 MED FILL — ?METFORMIN HCL 500MG TABL: 500 | 30 days supply | Qty: 120 | Fill #1

## 2020-08-28 MED FILL — ?CLOPIDOGREL 75MG TA: 75 | 30 days supply | Qty: 30 | Fill #4

## 2020-10-12 MED FILL — PANTOPRAZOLE SOD DR 40 MG T: 40 | 30 days supply | Qty: 30 | Fill #5

## 2020-10-12 MED FILL — ISOSORBIDE MN ER 30 MG TAB: 30 | 30 days supply | Qty: 60 | Fill #4

## 2020-10-12 MED FILL — ATORVASTATIN CALCIUM 80 MG: 80 | 30 days supply | Qty: 30 | Fill #5

## 2020-10-12 MED FILL — FUROSEMIDE 20 MG TABS: 20 | 30 days supply | Qty: 60 | Fill #2

## 2020-10-12 MED FILL — GLIMEPIRIDE 4 MG TABS: 4 | 30 days supply | Qty: 60 | Fill #5

## 2020-10-12 MED FILL — METFORMIN HCL 500 MG TABS: 500 | 30 days supply | Qty: 120 | Fill #2

## 2020-10-12 MED FILL — ?METOPROLOL TART 100 MG TAB: 100 | 30 days supply | Qty: 60 | Fill #1

## 2020-10-12 MED FILL — ?CLOPIDOGREL 75MG TA: 75 | 30 days supply | Qty: 30 | Fill #5

## 2020-10-18 ENCOUNTER — Other Ambulatory Visit: Payer: Self-pay

## 2020-10-18 ENCOUNTER — Encounter: Payer: Self-pay | Admitting: Family Medicine

## 2020-10-18 ENCOUNTER — Ambulatory Visit: Payer: Self-pay | Attending: Family Medicine | Admitting: Family Medicine

## 2020-10-18 VITALS — BP 165/93 | HR 70 | Ht 62.0 in | Wt 224.0 lb

## 2020-10-18 DIAGNOSIS — E785 Hyperlipidemia, unspecified: Secondary | ICD-10-CM

## 2020-10-18 DIAGNOSIS — I2510 Atherosclerotic heart disease of native coronary artery without angina pectoris: Secondary | ICD-10-CM

## 2020-10-18 DIAGNOSIS — I110 Hypertensive heart disease with heart failure: Secondary | ICD-10-CM

## 2020-10-18 DIAGNOSIS — I5032 Chronic diastolic (congestive) heart failure: Secondary | ICD-10-CM

## 2020-10-18 DIAGNOSIS — N939 Abnormal uterine and vaginal bleeding, unspecified: Secondary | ICD-10-CM

## 2020-10-18 DIAGNOSIS — Z9861 Coronary angioplasty status: Secondary | ICD-10-CM

## 2020-10-18 DIAGNOSIS — Z6841 Body Mass Index (BMI) 40.0 and over, adult: Secondary | ICD-10-CM

## 2020-10-18 DIAGNOSIS — E1159 Type 2 diabetes mellitus with other circulatory complications: Secondary | ICD-10-CM

## 2020-10-18 LAB — POCT GLYCOSYLATED HEMOGLOBIN (HGB A1C): HbA1c, POC (controlled diabetic range): 7.1 % — AB (ref 0.0–7.0)

## 2020-10-18 LAB — GLUCOSE, POCT (MANUAL RESULT ENTRY): POC Glucose: 102 mg/dl — AB (ref 70–99)

## 2020-10-18 NOTE — Patient Instructions (Signed)
DASH Eating Plan DASH stands for "Dietary Approaches to Stop Hypertension." The DASH eating plan is a healthy eating plan that has been shown to reduce high blood pressure (hypertension). It may also reduce your risk for type 2 diabetes, heart disease, and stroke. The DASH eating plan may also help with weight loss. What are tips for following this plan?  General guidelines  Avoid eating more than 2,300 mg (milligrams) of salt (sodium) a day. If you have hypertension, you may need to reduce your sodium intake to 1,500 mg a day.  Limit alcohol intake to no more than 1 drink a day for nonpregnant women and 2 drinks a day for men. One drink equals 12 oz of beer, 5 oz of wine, or 1 oz of hard liquor.  Work with your health care provider to maintain a healthy body weight or to lose weight. Ask what an ideal weight is for you.  Get at least 30 minutes of exercise that causes your heart to beat faster (aerobic exercise) most days of the week. Activities may include walking, swimming, or biking.  Work with your health care provider or diet and nutrition specialist (dietitian) to adjust your eating plan to your individual calorie needs. Reading food labels   Check food labels for the amount of sodium per serving. Choose foods with less than 5 percent of the Daily Value of sodium. Generally, foods with less than 300 mg of sodium per serving fit into this eating plan.  To find whole grains, look for the word "whole" as the first word in the ingredient list. Shopping  Buy products labeled as "low-sodium" or "no salt added."  Buy fresh foods. Avoid canned foods and premade or frozen meals. Cooking  Avoid adding salt when cooking. Use salt-free seasonings or herbs instead of table salt or sea salt. Check with your health care provider or pharmacist before using salt substitutes.  Do not fry foods. Cook foods using healthy methods such as baking, boiling, grilling, and broiling instead.  Cook with  heart-healthy oils, such as olive, canola, soybean, or sunflower oil. Meal planning  Eat a balanced diet that includes: ? 5 or more servings of fruits and vegetables each day. At each meal, try to fill half of your plate with fruits and vegetables. ? Up to 6-8 servings of whole grains each day. ? Less than 6 oz of lean meat, poultry, or fish each day. A 3-oz serving of meat is about the same size as a deck of cards. One egg equals 1 oz. ? 2 servings of low-fat dairy each day. ? A serving of nuts, seeds, or beans 5 times each week. ? Heart-healthy fats. Healthy fats called Omega-3 fatty acids are found in foods such as flaxseeds and coldwater fish, like sardines, salmon, and mackerel.  Limit how much you eat of the following: ? Canned or prepackaged foods. ? Food that is high in trans fat, such as fried foods. ? Food that is high in saturated fat, such as fatty meat. ? Sweets, desserts, sugary drinks, and other foods with added sugar. ? Full-fat dairy products.  Do not salt foods before eating.  Try to eat at least 2 vegetarian meals each week.  Eat more home-cooked food and less restaurant, buffet, and fast food.  When eating at a restaurant, ask that your food be prepared with less salt or no salt, if possible. What foods are recommended? The items listed may not be a complete list. Talk with your dietitian about   what dietary choices are best for you. Grains Whole-grain or whole-wheat bread. Whole-grain or whole-wheat pasta. Brown rice. Oatmeal. Quinoa. Bulgur. Whole-grain and low-sodium cereals. Pita bread. Low-fat, low-sodium crackers. Whole-wheat flour tortillas. Vegetables Fresh or frozen vegetables (raw, steamed, roasted, or grilled). Low-sodium or reduced-sodium tomato and vegetable juice. Low-sodium or reduced-sodium tomato sauce and tomato paste. Low-sodium or reduced-sodium canned vegetables. Fruits All fresh, dried, or frozen fruit. Canned fruit in natural juice (without  added sugar). Meat and other protein foods Skinless chicken or turkey. Ground chicken or turkey. Pork with fat trimmed off. Fish and seafood. Egg whites. Dried beans, peas, or lentils. Unsalted nuts, nut butters, and seeds. Unsalted canned beans. Lean cuts of beef with fat trimmed off. Low-sodium, lean deli meat. Dairy Low-fat (1%) or fat-free (skim) milk. Fat-free, low-fat, or reduced-fat cheeses. Nonfat, low-sodium ricotta or cottage cheese. Low-fat or nonfat yogurt. Low-fat, low-sodium cheese. Fats and oils Soft margarine without trans fats. Vegetable oil. Low-fat, reduced-fat, or light mayonnaise and salad dressings (reduced-sodium). Canola, safflower, olive, soybean, and sunflower oils. Avocado. Seasoning and other foods Herbs. Spices. Seasoning mixes without salt. Unsalted popcorn and pretzels. Fat-free sweets. What foods are not recommended? The items listed may not be a complete list. Talk with your dietitian about what dietary choices are best for you. Grains Baked goods made with fat, such as croissants, muffins, or some breads. Dry pasta or rice meal packs. Vegetables Creamed or fried vegetables. Vegetables in a cheese sauce. Regular canned vegetables (not low-sodium or reduced-sodium). Regular canned tomato sauce and paste (not low-sodium or reduced-sodium). Regular tomato and vegetable juice (not low-sodium or reduced-sodium). Pickles. Olives. Fruits Canned fruit in a light or heavy syrup. Fried fruit. Fruit in cream or butter sauce. Meat and other protein foods Fatty cuts of meat. Ribs. Fried meat. Bacon. Sausage. Bologna and other processed lunch meats. Salami. Fatback. Hotdogs. Bratwurst. Salted nuts and seeds. Canned beans with added salt. Canned or smoked fish. Whole eggs or egg yolks. Chicken or turkey with skin. Dairy Whole or 2% milk, cream, and half-and-half. Whole or full-fat cream cheese. Whole-fat or sweetened yogurt. Full-fat cheese. Nondairy creamers. Whipped toppings.  Processed cheese and cheese spreads. Fats and oils Butter. Stick margarine. Lard. Shortening. Ghee. Bacon fat. Tropical oils, such as coconut, palm kernel, or palm oil. Seasoning and other foods Salted popcorn and pretzels. Onion salt, garlic salt, seasoned salt, table salt, and sea salt. Worcestershire sauce. Tartar sauce. Barbecue sauce. Teriyaki sauce. Soy sauce, including reduced-sodium. Steak sauce. Canned and packaged gravies. Fish sauce. Oyster sauce. Cocktail sauce. Horseradish that you find on the shelf. Ketchup. Mustard. Meat flavorings and tenderizers. Bouillon cubes. Hot sauce and Tabasco sauce. Premade or packaged marinades. Premade or packaged taco seasonings. Relishes. Regular salad dressings. Where to find more information:  National Heart, Lung, and Blood Institute: www.nhlbi.nih.gov  American Heart Association: www.heart.org Summary  The DASH eating plan is a healthy eating plan that has been shown to reduce high blood pressure (hypertension). It may also reduce your risk for type 2 diabetes, heart disease, and stroke.  With the DASH eating plan, you should limit salt (sodium) intake to 2,300 mg a day. If you have hypertension, you may need to reduce your sodium intake to 1,500 mg a day.  When on the DASH eating plan, aim to eat more fresh fruits and vegetables, whole grains, lean proteins, low-fat dairy, and heart-healthy fats.  Work with your health care provider or diet and nutrition specialist (dietitian) to adjust your eating plan to your   individual calorie needs. This information is not intended to replace advice given to you by your health care provider. Make sure you discuss any questions you have with your health care provider. Document Revised: 11/14/2017 Document Reviewed: 11/25/2016 Elsevier Patient Education  2020 Elsevier Inc.  

## 2020-10-18 NOTE — Progress Notes (Signed)
Has been having some spotting, states she has not had a cycle in 14 years.   Has headaches today.

## 2020-10-18 NOTE — Progress Notes (Signed)
Subjective:  Patient ID: [name], female    DOB: November 21, 1956  Age: 64 y.o. MRN: [id number]  CC: Diabetes   HPI [name] is 64 year old female with a history of type 2 diabetes mellitus (A1c 7.1), dyslipidemia, coronary artery disease status post DES stent (in 08/2015 completedanticoagulation with Brilinta and aspirin for 12 months, restarted on Plavixby cardiology due to multiple stents and occlusion fromrecentcardiac cath), diastolic heart failure (EF> 65%, LVH from 06/2019), essential hypertension  A1c is 7.1 down from 8.0 previously and she endorses compliance with her medications and loves Trulicity.  She has no adverse effects from it.  Denies hypoglycemia, numbness in extremities, visual concerns.  She has not had a menstrual cycle in 14 years but recently noticed spotting which she has had for the last 3 weeks which she describes as 'here and there' and very light. Attributes this to starting when she started taking Airbone OTC which she takes for her immunity. BP is elevated and she attributes it to stress Woke up with a headache this morning but thinks it is due to stress as she has not had this previously.  She has no chest pain, dyspnea, pedal edema and has not been to see a cardiologist since 06/2019.  Past Medical History:  Diagnosis Date  . Anginal pain ([place])   . Coronary atherosclerosis of native coronary artery, with stent to LCX in 2006 06/04/2013  . Diabetes mellitus without complication ([place])   . GERD (gastroesophageal reflux disease)   . Heart attack ([place])   . Hyperlipidemia LDL goal < 70 06/07/2013  . Hypertension   . S/P coronary artery stent placement to RCA with PROMUS DES, 06/07/13, residual disease on OM1, OM2 and rPDA 06/07/2013    Past Surgical History:  Procedure Laterality Date  . CARDIAC CATHETERIZATION N/A 08/22/2015   Procedure: Left Heart Cath and Coronary Angiography;  Surgeon: Leonie Man, MD;  Location: [facility];  Service:  Cardiovascular;  Laterality: N/A;  . CARDIAC CATHETERIZATION N/A 08/22/2015   Procedure: Coronary Stent Intervention;  Surgeon: Leonie Man, MD;  Location: [place] [facility];  Service: Cardiovascular;  Laterality: N/A;  . CARDIAC CATHETERIZATION N/A 08/22/2015   Procedure: Left Heart Cath and Coronary Angiography;  Surgeon: Leonie Man, MD;  Location: [place] [facility];  Service: Cardiovascular;  Laterality: N/A;  . CORONARY STENT PLACEMENT  2006   TAXUS stent [facility]X in [facility]  . LEFT HEART CATH AND CORONARY ANGIOGRAPHY N/A 07/21/2018   Procedure: LEFT HEART CATH AND CORONARY ANGIOGRAPHY;  Surgeon: Troy Sine, MD;  Location: [place] [facility];  Service: Cardiovascular;  Laterality: N/A;  . LEFT HEART CATHETERIZATION WITH CORONARY ANGIOGRAM N/A 06/07/2013   Procedure: LEFT HEART CATHETERIZATION WITH CORONARY ANGIOGRAM;  Surgeon: Sanda Klein, MD;  Location: [facility];  Service: Cardiovascular;  Laterality: N/A;    Family History  Problem Relation Age of Onset  . Diabetes Mother   . Hypertension Mother   . Diabetes Sister   . Diabetes Brother   . Hypertension Brother     No Known Allergies  Outpatient Medications Prior to Visit  Medication Sig Dispense Refill  . acetaminophen (TYLENOL) 500 MG tablet Take 1 tablet (500 mg total) by mouth every 6 (six) hours as needed. 30 tablet 0  . aspirin EC 81 MG EC tablet Take 1 tablet (81 mg total) by mouth daily.    Marland Kitchen atorvastatin (LIPITOR) 80 MG tablet Take 1 tablet (80 mg total)  by mouth at bedtime. 30 tablet 6  . clopidogrel (PLAVIX) 75 MG tablet Take 1 tablet (75 mg total) by mouth daily with breakfast. 30 tablet 6  . diclofenac Sodium (VOLTAREN) 1 % GEL Apply 4 g topically 4 (four) times daily. 100 g 1  . Dulaglutide (TRULICITY) 1.5 ZO/1.0RU SOPN Inject 1.5 mg into the skin once a week. 4 pen 3  . furosemide (LASIX) 20 MG tablet TAKE 2 TABLETS (40 MG TOTAL) BY MOUTH DAILY. 60 tablet 2  . glimepiride (AMARYL) 4 MG tablet Take 2  tablets (8 mg total) by mouth daily with breakfast. 60 tablet 6  . Insulin Pen Needle (TRUEPLUS PEN NEEDLES) 32G X 4 MM MISC Use as directed to inject victoza once daily 100 each 3  . isosorbide mononitrate (IMDUR) 30 MG 24 hr tablet Take 2 tablets (60 mg total) by mouth daily. 180 tablet 1  . metFORMIN (GLUCOPHAGE) 500 MG tablet Take 2 tablets (1,000 mg total) by mouth 2 (two) times daily with a meal. 240 tablet 1  . metoprolol tartrate (LOPRESSOR) 100 MG tablet Take 1 tablet (100 mg total) by mouth 2 (two) times daily. 60 tablet 6  . nitroGLYCERIN (NITROSTAT) 0.4 MG SL tablet Place 1 tablet (0.4 mg total) under the tongue every 5 (five) minutes as needed for chest pain. 25 tablet 2  . pantoprazole (PROTONIX) 40 MG tablet Take 1 tablet (40 mg total) by mouth daily. 30 tablet 6  . triamcinolone cream (KENALOG) 0.1 % Apply 1 application topically 2 (two) times daily. 45 g 1  . cyclobenzaprine (FLEXERIL) 10 MG tablet Take 1 tablet (10 mg total) by mouth 2 (two) times daily as needed for muscle spasms. 10 tablet 0   No facility-administered medications prior to visit.     ROS Review of Systems  Constitutional: Negative for activity change, appetite change and fatigue.  HENT: Negative for congestion, sinus pressure and sore throat.   Eyes: Negative for visual disturbance.  Respiratory: Negative for cough, chest tightness, shortness of breath and wheezing.   Cardiovascular: Negative for chest pain and palpitations.  Gastrointestinal: Negative for abdominal distention, abdominal pain and constipation.  Endocrine: Negative for polydipsia.  Genitourinary: Negative for dysuria and frequency.  Musculoskeletal: Negative for arthralgias and back pain.  Skin: Negative for rash.  Neurological: Positive for headaches. Negative for tremors, light-headedness and numbness.  Hematological: Does not bruise/bleed easily.  Psychiatric/Behavioral: Negative for agitation and behavioral problems.    Objective:   BP (!) 165/93   Pulse 70   Ht _0  (1.575 m)   Wt 224 lb (101.6 kg)   SpO2 98%   BMI 40.97 kg/m   BP/Weight 10/18/2020 0/45/4098 12/16/9145  Systolic BP 829 562 130  Diastolic BP 93 78 85  Wt. (Lbs) 224 225 225  BMI 40.97 41.15 41.15      Physical Exam Constitutional:      Appearance: She is well-developed.  Neck:     Vascular: No JVD.  Cardiovascular:     Rate and Rhythm: Normal rate.     Heart sounds: Normal heart sounds. No murmur heard.   Pulmonary:     Effort: Pulmonary effort is normal.     Breath sounds: Normal breath sounds. No wheezing or rales.  Chest:     Chest wall: No tenderness.  Abdominal:     General: Bowel sounds are normal. There is no distension.     Palpations: Abdomen is soft. There is no mass.     Tenderness: There  is no abdominal tenderness.  Musculoskeletal:        General: Normal range of motion.     Right lower leg: No edema.     Left lower leg: No edema.  Neurological:     Mental Status: She is alert and oriented to person, place, and time.  Psychiatric:        Mood and Affect: Mood normal.     CMP Latest Ref Rng & Units 04/18/2020 12/31/2019 06/20/2019  Glucose 65 - 99 mg/dL 286(H) 132(H) 281(H)  BUN 8 - 27 mg/dL 24 17 41(H)  Creatinine 0.57 - 1.00 mg/dL 1.05(H) 1.10(H) 1.33(H)  Sodium 134 - 144 mmol/L 140 140 139  Potassium 3.5 - 5.2 mmol/L 4.7 3.5 4.2  Chloride 96 - 106 mmol/L 104 105 105  CO2 20 - 29 mmol/L _0 Calcium 8.7 - 10.3 mg/dL 9.4 9.2 8.8(L)  Total Protein 6.5 - 8.1 g/dL - 7.0 -  Total Bilirubin 0.3 - 1.2 mg/dL - 0.4 -  Alkaline Phos 38 - 126 U/L - 94 -  AST 15 - 41 U/L - 16 -  ALT 0 - 44 U/L - 17 -    Lipid Panel     Component Value Date/Time   CHOL 111 01/18/2020 1050   TRIG 146 01/18/2020 1050   HDL 35 (L) 01/18/2020 1050   CHOLHDL 3.2 01/18/2020 1050   CHOLHDL 2.4 02/11/2017 1002   VLDL 18 02/11/2017 1002   LDLCALC 51 01/18/2020 1050    CBC    Component Value Date/Time   WBC 7.0 12/31/2019 1930     RBC 3.85 (L) 12/31/2019 1930   HGB 12.1 12/31/2019 1930   HCT 37.1 12/31/2019 1930   PLT 308 12/31/2019 1930   MCV 96.4 12/31/2019 1930   MCH 31.4 12/31/2019 1930   MCHC 32.6 12/31/2019 1930   RDW 13.8 12/31/2019 1930   LYMPHSABS 2.8 12/31/2019 1930   MONOABS 0.6 12/31/2019 1930   EOSABS 0.2 12/31/2019 1930   BASOSABS 0.0 12/31/2019 1930    Lab Results  Component Value Date   HGBA1C 7.1 (A) 10/18/2020    Assessment & Plan:  1. Type 2 diabetes mellitus with other circulatory complication, without long-term current use of insulin (HCC) Controlled with A1c of 7.1 Continue current regimen Counseled on Diabetic diet, my plate method, 884 minutes of moderate intensity exercise/week Blood sugar logs with fasting goals of 80-120 mg/dl, random of less than 180 and in the event of sugars less than 60 mg/dl or greater than 400 mg/dl encouraged to notify the clinic. Advised on the need for annual eye exams, annual foot exams, Pneumonia vaccine. - POCT glucose (manual entry) - POCT glycosylated hemoglobin (Hb A1C) - CMP14+EGFR  2. CAD -S/P RCA DES 08/22/15 Asymptomatic Risk factor modification Follow-up with cardiology  3. Hypertensive heart disease with chronic diastolic congestive heart failure ([place]) Euvolemic with EF of > 65% on echo of 06/2019 Blood pressure is elevated today however she resists changing regimen and attributes it to stress Will reassess blood pressure at next office visit and adjust regimen accordingly if indicated Counseled on blood pressure goal of less than 130/80, low-sodium, DASH diet, medication compliance, 150 minutes of moderate intensity exercise per week. Discussed medication compliance, adverse effects.   4. Dyslipidemia Controlled Low-cholesterol diet Continue statin  5. Class 3 severe obesity due to excess calories with serious comorbidity and body mass index (BMI) of 40.0 to 44.9 in adult [facility]) Discussed a regular exercise regimen and she will  begin by walking 10 minutes a day on the days she is off Reduce portion sizes, increase physical activity  6. Vaginal spotting She is due for Pap smear Advised to hold off on the OTC supplement she was taking Schedule for Pap smear in 3 weeks.   No orders of the defined types were placed in this encounter.   Follow-up: Return in about 3 weeks (around 11/08/2020) for PAP smear.       [name], MD, FAAFP. [facility], [place]   10/18/2020, 12:09 PM

## 2020-10-19 LAB — CMP14+EGFR
ALT: 13 IU/L (ref 0–32)
AST: 15 IU/L (ref 0–40)
Albumin/Globulin Ratio: 1.6 (ref 1.2–2.2)
Albumin: 4.1 g/dL (ref 3.8–4.8)
Alkaline Phosphatase: 111 IU/L (ref 44–121)
BUN/Creatinine Ratio: 13 (ref 12–28)
BUN: 13 mg/dL (ref 8–27)
Bilirubin Total: 0.2 mg/dL (ref 0.0–1.2)
CO2: 24 mmol/L (ref 20–29)
Calcium: 9.7 mg/dL (ref 8.7–10.3)
Chloride: 106 mmol/L (ref 96–106)
Creatinine, Ser: 1 mg/dL (ref 0.57–1.00)
GFR calc Af Amer: 70 mL/min/{1.73_m2} (ref >59)
GFR calc non Af Amer: 61 mL/min/{1.73_m2} (ref >59)
Globulin, Total: 2.6 g/dL (ref 1.5–4.5)
Glucose: 117 mg/dL — ABNORMAL HIGH (ref 65–99)
Potassium: 4.5 mmol/L (ref 3.5–5.2)
Sodium: 143 mmol/L (ref 134–144)
Total Protein: 6.7 g/dL (ref 6.0–8.5)

## 2020-10-23 ENCOUNTER — Telehealth: Payer: Self-pay

## 2020-10-23 NOTE — Telephone Encounter (Signed)
Patient was called and a voicemail was left informing patient of normal results.

## 2020-10-23 NOTE — Telephone Encounter (Signed)
-----   Message from [name], MD sent at 10/19/2020 12:12 PM EDT ----- Please inform the patient that labs are normal. Thank you.

## 2020-11-21 ENCOUNTER — Emergency Department (HOSPITAL_COMMUNITY): Payer: HRSA Program

## 2020-11-21 ENCOUNTER — Emergency Department (HOSPITAL_COMMUNITY)
Admission: EM | Admit: 2020-11-21 | Discharge: 2020-11-21 | Disposition: A | Discharge status: Home / Self Care | Payer: HRSA Program | Attending: Emergency Medicine | Admitting: Emergency Medicine

## 2020-11-21 ENCOUNTER — Other Ambulatory Visit: Payer: Self-pay

## 2020-11-21 DIAGNOSIS — I2510 Atherosclerotic heart disease of native coronary artery without angina pectoris: Secondary | ICD-10-CM | POA: Diagnosis not present

## 2020-11-21 DIAGNOSIS — I5032 Chronic diastolic (congestive) heart failure: Secondary | ICD-10-CM | POA: Diagnosis not present

## 2020-11-21 DIAGNOSIS — Z7902 Long term (current) use of antithrombotics/antiplatelets: Secondary | ICD-10-CM | POA: Insufficient documentation

## 2020-11-21 DIAGNOSIS — Z79899 Other long term (current) drug therapy: Secondary | ICD-10-CM | POA: Diagnosis not present

## 2020-11-21 DIAGNOSIS — E1169 Type 2 diabetes mellitus with other specified complication: Secondary | ICD-10-CM | POA: Insufficient documentation

## 2020-11-21 DIAGNOSIS — Z955 Presence of coronary angioplasty implant and graft: Secondary | ICD-10-CM | POA: Insufficient documentation

## 2020-11-21 DIAGNOSIS — I110 Hypertensive heart disease with heart failure: Secondary | ICD-10-CM | POA: Insufficient documentation

## 2020-11-21 DIAGNOSIS — Z87891 Personal history of nicotine dependence: Secondary | ICD-10-CM | POA: Insufficient documentation

## 2020-11-21 DIAGNOSIS — R079 Chest pain, unspecified: Secondary | ICD-10-CM | POA: Diagnosis present

## 2020-11-21 DIAGNOSIS — Z7984 Long term (current) use of oral hypoglycemic drugs: Secondary | ICD-10-CM | POA: Insufficient documentation

## 2020-11-21 DIAGNOSIS — U071 COVID-19: Secondary | ICD-10-CM | POA: Diagnosis not present

## 2020-11-21 DIAGNOSIS — Z7982 Long term (current) use of aspirin: Secondary | ICD-10-CM | POA: Insufficient documentation

## 2020-11-21 DIAGNOSIS — Z794 Long term (current) use of insulin: Secondary | ICD-10-CM | POA: Insufficient documentation

## 2020-11-21 LAB — CBC
HCT: 39 % (ref 36.0–46.0)
Hemoglobin: 13.1 g/dL (ref 12.0–15.0)
MCH: 30.6 pg (ref 26.0–34.0)
MCHC: 33.6 g/dL (ref 30.0–36.0)
MCV: 91.1 fL (ref 80.0–100.0)
Platelets: 190 10*3/uL (ref 150–400)
RBC: 4.28 MIL/uL (ref 3.87–5.11)
RDW: 13.8 % (ref 11.5–15.5)
WBC: 3.1 10*3/uL — ABNORMAL LOW (ref 4.0–10.5)
nRBC: 0 % (ref 0.0–0.2)

## 2020-11-21 LAB — RESP PANEL BY RT-PCR (FLU A&B, COVID) ARPGX2
Influenza A by PCR: NEGATIVE
Influenza B by PCR: NEGATIVE
SARS Coronavirus 2 by RT PCR: POSITIVE — AB

## 2020-11-21 LAB — BASIC METABOLIC PANEL
Anion gap: 10 (ref 5–15)
BUN: 10 mg/dL (ref 8–23)
CO2: 24 mmol/L (ref 22–32)
Calcium: 8.5 mg/dL — ABNORMAL LOW (ref 8.9–10.3)
Chloride: 101 mmol/L (ref 98–111)
Creatinine, Ser: 0.92 mg/dL (ref 0.44–1.00)
GFR, Estimated: >60 mL/min (ref >60)
Glucose, Bld: 203 mg/dL — ABNORMAL HIGH (ref 70–99)
Potassium: 3.3 mmol/L — ABNORMAL LOW (ref 3.5–5.1)
Sodium: 135 mmol/L (ref 135–145)

## 2020-11-21 LAB — TROPONIN I (HIGH SENSITIVITY)
Troponin I (High Sensitivity): 7 ng/L (ref <18)
Troponin I (High Sensitivity): 8 ng/L (ref <18)

## 2020-11-21 MED ORDER — SODIUM CHLORIDE 0.9 % IV SOLN: 1200.0000 mg | Freq: Once | INTRAVENOUS | Status: DC

## 2020-11-21 MED ORDER — SODIUM CHLORIDE 0.9 % IV SOLN: INTRAVENOUS | Status: DC | PRN

## 2020-11-21 MED ORDER — EPINEPHRINE 0.3 MG/0.3ML IJ SOAJ
0.3000 mg | Freq: Once | INTRAMUSCULAR | Status: DC | PRN
  Filled 2020-11-21: qty 0.3

## 2020-11-21 MED ORDER — ALBUTEROL SULFATE HFA 108 (90 BASE) MCG/ACT IN AERS
2.0000 | INHALATION_SPRAY | Freq: Once | RESPIRATORY_TRACT | Status: AC | PRN
  Administered 2020-11-21: 2 via RESPIRATORY_TRACT

## 2020-11-21 MED ORDER — DEXAMETHASONE 4 MG PO TABS
6.0000 mg | ORAL_TABLET | Freq: Once | ORAL | Status: AC
  Administered 2020-11-21: 6 mg via ORAL
  Filled 2020-11-21: qty 2

## 2020-11-21 MED ORDER — SODIUM CHLORIDE 0.9 % IV SOLN
Freq: Once | INTRAVENOUS | Status: AC
  Filled 2020-11-21: qty 20

## 2020-11-21 MED ORDER — FAMOTIDINE IN NACL 20-0.9 MG/50ML-% IV SOLN
20.0000 mg | Freq: Once | INTRAVENOUS | Status: DC | PRN
  Filled 2020-11-21: qty 50

## 2020-11-21 MED ORDER — METHYLPREDNISOLONE SODIUM SUCC 125 MG IJ SOLR
125.0000 mg | Freq: Once | INTRAMUSCULAR | Status: DC | PRN
  Filled 2020-11-21: qty 2

## 2020-11-21 MED ORDER — DIPHENHYDRAMINE HCL 50 MG/ML IJ SOLN
50.0000 mg | Freq: Once | INTRAMUSCULAR | Status: DC | PRN
  Filled 2020-11-21: qty 1

## 2020-11-21 MED ORDER — ALBUTEROL SULFATE HFA 108 (90 BASE) MCG/ACT IN AERS
2.0000 | INHALATION_SPRAY | RESPIRATORY_TRACT | Status: DC
  Filled 2020-11-21: qty 6.7

## 2020-11-21 NOTE — ED Notes (Signed)
Discharged  IV removed

## 2020-11-21 NOTE — ED Notes (Signed)
COVID+   PA aware  Medication started per [facility]

## 2020-11-21 NOTE — ED Notes (Signed)
Patient states she had a at home COVID test that was positive 9 days ago. Came in d/t CP that radiates down her R side.  A0x4 at this time  Denies any SOB, not febrile upon arrival.

## 2020-11-21 NOTE — ED Provider Notes (Signed)
[facility] EMERGENCY DEPARTMENT Provider Note   CSN: [id number] Arrival date & time: 11/21/20  1100     History Chief Complaint  Patient presents with  . Chest Pain    [name] is a 64 y.o. female.  The history is provided by the patient. No language interpreter was used.  Chest Pain Pain location:  L chest and R chest Pain radiates to:  Does not radiate Pain severity:  Moderate Onset quality:  Gradual Duration:  8 days Timing:  Constant Progression:  Worsening Chronicity:  New Relieved by:  Nothing Worsened by:  Nothing Ineffective treatments:  None tried Risk factors: diabetes mellitus   Pt reports she had a positive home covid test.  Pt reports increasing shortness of breath      Past Medical History:  Diagnosis Date  . Anginal pain ([place])   . Coronary atherosclerosis of native coronary artery, with stent to LCX in 2006 06/04/2013  . Diabetes mellitus without complication ([place])   . GERD (gastroesophageal reflux disease)   . Heart attack ([place])   . Hyperlipidemia LDL goal < 70 06/07/2013  . Hypertension   . S/P coronary artery stent placement to RCA with PROMUS DES, 06/07/13, residual disease on OM1, OM2 and rPDA 06/07/2013    Patient Active Problem List   Diagnosis Date Noted  . Diabetes mellitus type 2 in obese ([place]) 06/25/2019  . Angina pectoris ([place]) 06/19/2019  . Viral gastroenteritis 06/19/2019  . AKI (acute kidney injury) ([place]) 06/19/2019  . Coronary artery disease involving native coronary artery of native heart with angina pectoris ([place])   . Osteoarthritis of both hands 05/01/2016  . Chronic diastolic CHF (congestive heart failure) ([place]) 12/06/2015  . Non-compliance with treatment-(cost) 08/24/2015  . Hypertensive cardiovascular disease 08/24/2015  . Dyslipidemia 08/24/2015  . CAD -S/P RCA DES 08/22/15   . NSTEMI (non-ST elevated myocardial infarction) ([place]) 08/18/2015  . Morbid obesity ([place]) 06/15/2013  . Type 2 diabetes  mellitus, uncontrolled ([place]) 06/04/2013  . Essential hypertension 06/04/2013    Past Surgical History:  Procedure Laterality Date  . CARDIAC CATHETERIZATION N/A 08/22/2015   Procedure: Left Heart Cath and Coronary Angiography;  Surgeon: Leonie Man, MD;  Location: [facility] CV LAB;  Service: Cardiovascular;  Laterality: N/A;  . CARDIAC CATHETERIZATION N/A 08/22/2015   Procedure: Coronary Stent Intervention;  Surgeon: Leonie Man, MD;  Location: [facility];  Service: Cardiovascular;  Laterality: N/A;  . CARDIAC CATHETERIZATION N/A 08/22/2015   Procedure: Left Heart Cath and Coronary Angiography;  Surgeon: Leonie Man, MD;  Location: [facility];  Service: Cardiovascular;  Laterality: N/A;  . CORONARY STENT PLACEMENT  2006   [facility]  . LEFT HEART CATH AND CORONARY ANGIOGRAPHY N/A 07/21/2018   Procedure: LEFT HEART CATH AND CORONARY ANGIOGRAPHY;  Surgeon: Troy Sine, MD;  Location: [facility];  Service: Cardiovascular;  Laterality: N/A;  . LEFT HEART CATHETERIZATION WITH CORONARY ANGIOGRAM N/A 06/07/2013   Procedure: LEFT HEART CATHETERIZATION WITH CORONARY ANGIOGRAM;  Surgeon: Sanda Klein, MD;  Location: [facility] CATH LAB;  Service: Cardiovascular;  Laterality: N/A;     OB History   No obstetric history on file.     Family History  Problem Relation Age of Onset  . Diabetes Mother   . Hypertension Mother   . Diabetes Sister   . Diabetes Brother   . Hypertension Brother     Social History   Tobacco Use  .  Smoking status: Former Smoker    Years: 25.00    Quit date: 07/03/2004    Years since quitting: 16.3  . Smokeless tobacco: Never Used  Substance Use Topics  . Alcohol use: Yes    Comment: occ  . Drug use: No    Home Medications Prior to Admission medications   Medication Sig Start Date End Date Taking? Authorizing Provider  acetaminophen (TYLENOL) 500 MG tablet Take 1 tablet (500 mg total) by mouth every 6 (six) hours  as needed. 12/31/19   Rodell Perna A, PA-C  aspirin EC 81 MG EC tablet Take 1 tablet (81 mg total) by mouth daily. 08/24/15   Erlene Quan, PA-C  atorvastatin (LIPITOR) 80 MG tablet Take 1 tablet (80 mg total) by mouth at bedtime. 01/18/20   [name], MD  clopidogrel (PLAVIX) 75 MG tablet Take 1 tablet (75 mg total) by mouth daily with breakfast. 01/18/20   [name], MD  diclofenac Sodium (VOLTAREN) 1 % GEL Apply 4 g topically 4 (four) times daily. 04/19/20   [name], MD  Dulaglutide (TRULICITY) 1.5 SW/1.0XN SOPN Inject 1.5 mg into the skin once a week. 04/18/20   [name], MD  furosemide (LASIX) 20 MG tablet TAKE 2 TABLETS (40 MG TOTAL) BY MOUTH DAILY. 07/10/20   [name], MD  glimepiride (AMARYL) 4 MG tablet Take 2 tablets (8 mg total) by mouth daily with breakfast. 01/18/20   [name], MD  Insulin Pen Needle (TRUEPLUS PEN NEEDLES) 32G X 4 MM MISC Use as directed to inject victoza once daily 08/20/18   [name], MD  isosorbide mononitrate (IMDUR) 30 MG 24 hr tablet Take 2 tablets (60 mg total) by mouth daily. 01/18/20   [name], MD  metFORMIN (GLUCOPHAGE) 500 MG tablet Take 2 tablets (1,000 mg total) by mouth 2 (two) times daily with a meal. 01/18/20   [name], MD  metoprolol tartrate (LOPRESSOR) 100 MG tablet Take 1 tablet (100 mg total) by mouth 2 (two) times daily. 01/18/20   [name], MD  nitroGLYCERIN (NITROSTAT) 0.4 MG SL tablet Place 1 tablet (0.4 mg total) under the tongue every 5 (five) minutes as needed for chest pain. 08/24/15   Erlene Quan, PA-C  pantoprazole (PROTONIX) 40 MG tablet Take 1 tablet (40 mg total) by mouth daily. 01/18/20   [name], MD  triamcinolone cream (KENALOG) 0.1 % Apply 1 application topically 2 (two) times daily. 04/18/20   [name], MD    Allergies    Patient has no known allergies.  Review of Systems   Review of Systems  Cardiovascular: Positive for chest pain.  All other systems reviewed and  are negative.   Physical Exam Updated Vital Signs BP (!) 177/85   Pulse 83   Temp 98.9 F (37.2 C) (Oral)   Resp 19   Ht 5\' 2"  (1.575 m)   Wt 102.1 kg   SpO2 97%   BMI 41.15 kg/m   Physical Exam Vitals and nursing note reviewed.  Constitutional:      Appearance: She is well-developed.  HENT:     Head: Normocephalic.  Eyes:     Pupils: Pupils are equal, round, and reactive to light.  Cardiovascular:     Rate and Rhythm: Normal rate and regular rhythm.     Heart sounds: Normal heart sounds.  Pulmonary:     Effort: Pulmonary effort is normal.     Breath sounds: Normal breath sounds.  Abdominal:     General:  There is no distension.     Palpations: Abdomen is soft.  Musculoskeletal:        General: Normal range of motion.     Cervical back: Normal range of motion.  Skin:    General: Skin is warm.  Neurological:     Mental Status: She is alert and oriented to person, place, and time.     ED Results / Procedures / Treatments   Labs (all labs ordered are listed, but only abnormal results are displayed) Labs Reviewed  RESP PANEL BY RT-PCR (FLU A&B, COVID) ARPGX2 - Abnormal; Notable for the following components:      Result Value   SARS Coronavirus 2 by RT PCR POSITIVE (*)    All other components within normal limits  BASIC METABOLIC PANEL - Abnormal; Notable for the following components:   Potassium 3.3 (*)    Glucose, Bld 203 (*)    Calcium 8.5 (*)    All other components within normal limits  CBC - Abnormal; Notable for the following components:   WBC 3.1 (*)    All other components within normal limits  TROPONIN I (HIGH SENSITIVITY)  TROPONIN I (HIGH SENSITIVITY)    EKG EKG Interpretation  Date/Time:  Tuesday November 21 2020 11:31:54 EST Ventricular Rate:  95 PR Interval:  124 QRS Duration: 76 QT Interval:  338 QTC Calculation: 424 R Axis:   66 Text Interpretation: Normal sinus rhythm Nonspecific T wave abnormality Abnormal ECG T wave inversion new  from prior 7/20 Confirmed by Aletta Edouard [phone]) on 11/21/2020 4:51:11 PM   Radiology DG Chest Portable 1 View  Result Date: 11/21/2020 CLINICAL DATA:  Chest pain EXAM: PORTABLE CHEST 1 VIEW COMPARISON:  12/31/2019 FINDINGS: The heart size and mediastinal contours are within normal limits. Low lung volumes with streaky bibasilar opacities. No pleural effusion or pneumothorax. The visualized skeletal structures are unremarkable. IMPRESSION: Low lung volumes with streaky bibasilar opacities, which may represent atelectasis versus infection. Electronically Signed   By: Davina Poke D.O.   On: 11/21/2020 13:06    Procedures Procedures (including critical care time)  Medications Ordered in ED Medications  0.9 %  sodium chloride infusion (has no administration in time range)  diphenhydrAMINE (BENADRYL) injection 50 mg (has no administration in time range)  famotidine (PEPCID) IVPB 20 mg premix (has no administration in time range)  methylPREDNISolone sodium succinate (SOLU-MEDROL) 125 mg/2 mL injection 125 mg (has no administration in time range)  EPINEPHrine (EPI-PEN) injection 0.3 mg (has no administration in time range)  albuterol (VENTOLIN HFA) 108 (90 Base) MCG/ACT inhaler 2 puff (has no administration in time range)  bamlanivimab 700 mg, etesevimab 1,400 mg in sodium chloride 0.9 % 160 mL IVPB ( Intravenous New Bag/Given 11/21/20 1859)  0.9 %  sodium chloride infusion (has no administration in time range)  albuterol (VENTOLIN HFA) 108 (90 Base) MCG/ACT inhaler 2 puff (2 puffs Inhalation Given 11/21/20 1643)  dexamethasone (DECADRON) tablet 6 mg (6 mg Oral Given 11/21/20 1643)    ED Course  I have reviewed the triage vital signs and the nursing notes.  Pertinent labs & imaging results that were available during my care of the patient were reviewed by me and considered in my medical decision making (see chart for details).    MDM Rules/Calculators/A&P                           MDM:  Pt agrees to MAB treatment.  Pt given  albuterol inhaler and decadron.  Pt advised to return if any problems.  Final Clinical Impression(s) / ED Diagnoses Final diagnoses:  COVID    Rx / DC Orders ED Discharge Orders    None    An After Visit Summary was printed and given to the patient.    Fransico Meadow, [place] 11/21/20 1929    Hayden Rasmussen, MD 11/22/20 1220

## 2020-11-21 NOTE — Discharge Instructions (Signed)
Use inhaler 2 puffs every 4 hours.  Return if any problems.

## 2020-11-21 NOTE — ED Triage Notes (Signed)
Pt here for eval of central chest pain with radiation to bilateral breasts and back. Took at home covid test on Tuesday, which was positive.

## 2020-12-06 ENCOUNTER — Other Ambulatory Visit: Payer: Self-pay | Admitting: Family Medicine

## 2020-12-06 DIAGNOSIS — I5032 Chronic diastolic (congestive) heart failure: Secondary | ICD-10-CM

## 2020-12-06 MED FILL — METFORMIN HCL 500 MG TABS: 500 | 30 days supply | Qty: 120 | Fill #3

## 2020-12-06 MED FILL — FUROSEMIDE 20 MG TABS: 20 | 30 days supply | Qty: 60 | Fill #0

## 2020-12-06 MED FILL — GLIMEPIRIDE 4 MG TABS: 4 | 30 days supply | Qty: 60 | Fill #6

## 2020-12-06 MED FILL — PANTOPRAZOLE SOD DR 40 MG T: 40 | 30 days supply | Qty: 30 | Fill #6

## 2020-12-06 MED FILL — ?METOPROLOL TART 100 MG TAB: 100 | 30 days supply | Qty: 60 | Fill #2

## 2020-12-06 MED FILL — ISOSORBIDE MN ER 30 MG TAB: 30 | 30 days supply | Qty: 60 | Fill #5

## 2020-12-06 MED FILL — ATORVASTATIN CALCIUM 80 MG: 80 | 30 days supply | Qty: 30 | Fill #6

## 2020-12-06 MED FILL — ?CLOPIDOGREL 75MG TA: 75 | 30 days supply | Qty: 30 | Fill #6

## 2020-12-14 ENCOUNTER — Other Ambulatory Visit: Payer: Self-pay

## 2020-12-14 ENCOUNTER — Ambulatory Visit: Payer: Self-pay | Attending: Family Medicine | Admitting: Family Medicine

## 2020-12-14 ENCOUNTER — Encounter: Payer: Self-pay | Admitting: Family Medicine

## 2020-12-14 VITALS — BP 138/86 | HR 81 | Ht 62.0 in | Wt 219.8 lb

## 2020-12-14 DIAGNOSIS — Z8616 Personal history of COVID-19: Secondary | ICD-10-CM

## 2020-12-14 DIAGNOSIS — R5383 Other fatigue: Secondary | ICD-10-CM

## 2020-12-14 DIAGNOSIS — Z124 Encounter for screening for malignant neoplasm of cervix: Secondary | ICD-10-CM

## 2020-12-14 DIAGNOSIS — N950 Postmenopausal bleeding: Secondary | ICD-10-CM

## 2020-12-14 NOTE — Progress Notes (Signed)
Subjective:  Patient ID: [name], female    DOB: [birth year]  Age: 64 y.o. MRN: [id number]  CC: Gynecologic Exam   HPI [name] is a64 year old female with a history of type 2 diabetes mellitus (A1c7.1), dyslipidemia, coronary artery disease status post DES stent (in 08/2015 completedanticoagulation with Brilinta and aspirin for 12 months, restarted on Plavixby cardiology due to multiple stents and occlusion fromrecentcardiac cath), diastolic heart failure (EF>65%, LVH from 06/2019), essential hypertension, COVID-19 is 62/2021 s/p MAB infusion.  She has been having brownish spotting since her last visit now for a total of 2 months. No abdominal cramps.  Pap smear scheduled to evaluate further. She has noticed fatigue since her Covid infection 3 weeks ago but denies presence of chest pains, fever, myalgia. Past Medical History:  Diagnosis Date  . Anginal pain (H[facility])   . Coronary atherosclerosis of native coronary artery, with stent to LCX in 2006 06/04/2013  . Diabetes mellitus without complication ([facility])   . GERD (gastroesophageal reflux disease)   . Heart attack ([facility])   . Hyperlipidemia LDL goal < 70 06/07/2013  . Hypertension   . S/P coronary artery stent placement to RCA with PROMUS DES, 06/07/13, residual disease on OM1, OM2 and rPDA 06/07/2013    Past Surgical History:  Procedure Laterality Date  . CARDIAC CATHETERIZATION N/A 08/22/2015   Procedure: Left Heart Cath and Coronary Angiography;  Surgeon: Marykay Lex, MD;  Location: [facility] INVASIVE CV LAB;  Service: Cardiovascular;  Laterality: N/A;  . CARDIAC CATHETERIZATION N/A 08/22/2015   Procedure: Coronary Stent Intervention;  Surgeon: Marykay Lex, MD;  Location: [facility] INVASIVE CV LAB;  Service: Cardiovascular;  Laterality: N/A;  . CARDIAC CATHETERIZATION N/A 08/22/2015   Procedure: Left Heart Cath and Coronary Angiography;  Surgeon: Marykay Lex, MD;  Location: [facility] INVASIVE CV LAB;  Service: Cardiovascular;  Laterality:  N/A;  . CORONARY STENT PLACEMENT  2006   [facility] stent [facility]X in [facility]  . LEFT HEART CATH AND CORONARY ANGIOGRAPHY N/A 07/21/2018   Procedure: LEFT HEART CATH AND CORONARY ANGIOGRAPHY;  Surgeon: Lennette Bihari, MD;  Location: [facility] INVASIVE CV LAB;  Service: Cardiovascular;  Laterality: N/A;  . LEFT HEART CATHETERIZATION WITH CORONARY ANGIOGRAM N/A 06/07/2013   Procedure: LEFT HEART CATHETERIZATION WITH CORONARY ANGIOGRAM;  Surgeon: Thurmon Fair, MD;  Location: [facility] CATH LAB;  Service: Cardiovascular;  Laterality: N/A;    Family History  Problem Relation Age of Onset  . Diabetes Mother   . Hypertension Mother   . Diabetes Sister   . Diabetes Brother   . Hypertension Brother     No Known Allergies  Outpatient Medications Prior to Visit  Medication Sig Dispense Refill  . acetaminophen (TYLENOL) 500 MG tablet Take 1 tablet (500 mg total) by mouth every 6 (six) hours as needed. 30 tablet 0  . aspirin EC 81 MG EC tablet Take 1 tablet (81 mg total) by mouth daily.    Marland Kitchen atorvastatin (LIPITOR) 80 MG tablet Take 1 tablet (80 mg total) by mouth at bedtime. 30 tablet 6  . clopidogrel (PLAVIX) 75 MG tablet Take 1 tablet (75 mg total) by mouth daily with breakfast. 30 tablet 6  . diclofenac Sodium (VOLTAREN) 1 % GEL Apply 4 g topically 4 (four) times daily. 100 g 1  . Dulaglutide (TRULICITY) 1.5 MG/0.5ML SOPN Inject 1.5 mg into the skin once a week. 4 pen 3  . furosemide (LASIX) 20 MG tablet TAKE 2 TABLETS (40 MG TOTAL) BY MOUTH DAILY.  60 tablet 2  . glimepiride (AMARYL) 4 MG tablet Take 2 tablets (8 mg total) by mouth daily with breakfast. 60 tablet 6  . Insulin Pen Needle (TRUEPLUS PEN NEEDLES) 32G X 4 MM MISC Use as directed to inject victoza once daily 100 each 3  . isosorbide mononitrate (IMDUR) 30 MG 24 hr tablet Take 2 tablets (60 mg total) by mouth daily. 180 tablet 1  . metFORMIN (GLUCOPHAGE) 500 MG tablet Take 2 tablets (1,000 mg total) by mouth 2 (two) times daily with a meal. 240 tablet 1   . metoprolol tartrate (LOPRESSOR) 100 MG tablet Take 1 tablet (100 mg total) by mouth 2 (two) times daily. 60 tablet 6  . nitroGLYCERIN (NITROSTAT) 0.4 MG SL tablet Place 1 tablet (0.4 mg total) under the tongue every 5 (five) minutes as needed for chest pain. 25 tablet 2  . pantoprazole (PROTONIX) 40 MG tablet Take 1 tablet (40 mg total) by mouth daily. 30 tablet 6  . triamcinolone cream (KENALOG) 0.1 % Apply 1 application topically 2 (two) times daily. 45 g 1   No facility-administered medications prior to visit.     ROS Review of Systems  Constitutional: Positive for fatigue. Negative for activity change and appetite change.  HENT: Negative for congestion, sinus pressure and sore throat.   Eyes: Negative for visual disturbance.  Respiratory: Negative for cough, chest tightness, shortness of breath and wheezing.   Cardiovascular: Negative for chest pain and palpitations.  Gastrointestinal: Negative for abdominal distention, abdominal pain and constipation.  Endocrine: Negative for polydipsia.  Genitourinary: Negative for dysuria and frequency.  Musculoskeletal: Negative for arthralgias and back pain.  Skin: Negative for rash.  Neurological: Negative for tremors, light-headedness and numbness.  Hematological: Does not bruise/bleed easily.  Psychiatric/Behavioral: Negative for agitation and behavioral problems.    Objective:  BP 138/86   Pulse 81   Ht 5\' 2"  (1.575 m)   Wt 219 lb 12.8 oz (99.7 kg)   SpO2 99%   BMI 40.20 kg/m   BP/Weight 62/30/2021 11/21/2020 [id number]  Systolic BP 0000000 123XX123 123XX123  Diastolic BP 86 94 93  Wt. (Lbs) 219.8 225 224  BMI 40.2 41.15 40.97      Physical Exam Constitutional:      Appearance: She is well-developed. She is obese.  Neck:     Vascular: No JVD.  Cardiovascular:     Rate and Rhythm: Normal rate.     Heart sounds: Normal heart sounds. No murmur heard.   Pulmonary:     Effort: Pulmonary effort is normal.     Breath sounds: Normal  breath sounds. No wheezing or rales.  Chest:     Chest wall: No tenderness.  Abdominal:     General: Bowel sounds are normal. There is no distension.     Palpations: Abdomen is soft. There is no mass.     Tenderness: There is no abdominal tenderness.  Genitourinary:    Comments: External genitalia, vagina-normal. Cervix-bloody discharge Adnexa-normal Musculoskeletal:        General: Normal range of motion.     Right lower leg: No edema.     Left lower leg: No edema.  Neurological:     Mental Status: She is alert and oriented to person, place, and time.  Psychiatric:        Mood and Affect: Mood normal.     CMP Latest Ref Rng & Units 11/21/2020 10/18/2020 04/18/2020  Glucose 70 - 99 mg/dL 203(H) 117(H) 286(H)  BUN 8 - 23  mg/dL 10 13 24   Creatinine 0.44 - 1.00 mg/dL 0.92 1.00 1.05(H)  Sodium 135 - 145 mmol/L 135 143 140  Potassium 3.5 - 5.1 mmol/L 3.3(L) 4.5 4.7  Chloride 98 - 111 mmol/L 101 106 104  CO2 22 - 32 mmol/L 24 24 20   Calcium 8.9 - 10.3 mg/dL 8.5(L) 9.7 9.4  Total Protein 6.0 - 8.5 g/dL - 6.7 -  Total Bilirubin 0.0 - 1.2 mg/dL - 0.2 -  Alkaline Phos 44 - 121 IU/L - 111 -  AST 0 - 40 IU/L - 15 -  ALT 0 - 32 IU/L - 13 -    Lipid Panel     Component Value Date/Time   CHOL 111 01/18/2020 1050   TRIG 146 01/18/2020 1050   HDL 35 (L) 01/18/2020 1050   CHOLHDL 3.2 01/18/2020 1050   CHOLHDL 2.4 02/11/2017 1002   VLDL 18 02/11/2017 1002   LDLCALC 51 01/18/2020 1050    CBC    Component Value Date/Time   WBC 3.1 (L) 11/21/2020 1153   RBC 4.28 11/21/2020 1153   HGB 13.1 11/21/2020 1153   HCT 39.0 11/21/2020 1153   PLT 190 11/21/2020 1153   MCV 91.1 11/21/2020 1153   MCH 30.6 11/21/2020 1153   MCHC 33.6 11/21/2020 1153   RDW 13.8 11/21/2020 1153   LYMPHSABS 2.8 12/31/2019 1930   MONOABS 0.6 12/31/2019 1930   EOSABS 0.2 12/31/2019 1930   BASOSABS 0.0 12/31/2019 1930    Lab Results  Component Value Date   HGBA1C 7.1 (A) 10/18/2020    Assessment & Plan:   1. Post-menopausal bleeding We will need to screen for malignancy - US Pelvic Complete With Transvaginal; Future - Cytology - PAP([place])  2. Other fatigue Post Covid Activity as tolerated; discussed long-haul symptoms of COVID-19 Advised on intake of vitamin C, vitamin D, zinc - Basic Metabolic Panel - CBC with Differential/Platelet  3. History of COVID-19 See #2 above  4. Screening for cervical cancer - Cytology - PAP([place])   No orders of the defined types were placed in this encounter.   Follow-up: Return in about 3 months (around 03/14/2021) for chronic disease management.       [name], MD, FAAFP. [facility] [name] [place], [place]   12/14/2020, 9:47 AM

## 2020-12-14 NOTE — Patient Instructions (Signed)
Abnormal Uterine Bleeding °Abnormal uterine bleeding means bleeding more than usual from your uterus. It can include: °· Bleeding between periods. °· Bleeding after sex. °· Bleeding that is heavier than normal. °· Periods that last longer than usual. °· Bleeding after you have stopped having your period (menopause). °There are many problems that may cause this. You should see a doctor for any kind of bleeding that is not normal. Treatment depends on the cause of the bleeding. °Follow these instructions at home: °· Watch your condition for any changes. °· Do not use tampons, douche, or have sex, if your doctor tells you not to. °· Change your pads often. °· Get regular well-woman exams. Make sure they include a pelvic exam and cervical cancer screening. °· Keep all follow-up visits as told by your doctor. This is important. °Contact a doctor if: °· The bleeding lasts more than one week. °· You feel dizzy at times. °· You feel like you are going to throw up (nauseous). °· You throw up. °Get help right away if: °· You pass out. °· You have to change pads every hour. °· You have belly (abdominal) pain. °· You have a fever. °· You get sweaty. °· You get weak. °· You passing large blood clots from your vagina. °Summary °· Abnormal uterine bleeding means bleeding more than usual from your uterus. °· There are many problems that may cause this. You should see a doctor for any kind of bleeding that is not normal. °· Treatment depends on the cause of the bleeding. °This information is not intended to replace advice given to you by your health care provider. Make sure you discuss any questions you have with your health care provider. °Document Revised: 11/26/2016 Document Reviewed: 11/26/2016 °Elsevier Patient Education © 2020 [employer] Inc[employer] ° °

## 2020-12-21 ENCOUNTER — Telehealth: Payer: Self-pay | Admitting: Family Medicine

## 2020-12-21 ENCOUNTER — Encounter (HOSPITAL_COMMUNITY): Payer: Self-pay

## 2020-12-21 ENCOUNTER — Ambulatory Visit (HOSPITAL_COMMUNITY): Payer: Self-pay

## 2020-12-21 NOTE — Telephone Encounter (Signed)
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

## 2020-12-21 NOTE — Telephone Encounter (Signed)
Call placed to Pathology and she informed me that she will speak with the doctor and get the results into epic.

## 2020-12-21 NOTE — Telephone Encounter (Signed)
Can you please inform them there is no result in her chart and it still says active. They need to result the PAP smear so I can inform the patient and place referral. Thanks

## 2020-12-21 NOTE — Telephone Encounter (Signed)
Copied from CRM [id number]. Topic: General - Inquiry >> Dec 19, 2020  3:03 PM Daphine Deutscher D wrote: Reason for CRM: Deanna Artis with [facility] pathology dept called wanting to give a verbal report of the patients labs.  They have been trying to get through but cant's get answer.  CB#  [phone]

## 2020-12-24 ENCOUNTER — Other Ambulatory Visit: Payer: Self-pay | Admitting: Family Medicine

## 2020-12-24 DIAGNOSIS — C801 Malignant (primary) neoplasm, unspecified: Secondary | ICD-10-CM

## 2020-12-24 LAB — CYTOLOGY - PAP
Comment: NEGATIVE
High risk HPV: NEGATIVE

## 2020-12-25 NOTE — Telephone Encounter (Signed)
Test was resulted on 12/24/20 and I informed the patient right after. Urgent referral to GYN placed

## 2020-12-28 NOTE — Telephone Encounter (Signed)
Pt calling to let Dr Margarita Rana know her appt with OBGYN is not until 01/12/21. Pt is asking if anything Dr Margarita Rana can do to get her a sooner appt.? She said maybe go somewhere else?  Please advise   Pt would like call back [phone]

## 2020-12-29 NOTE — Telephone Encounter (Signed)
[facility]  they don't have nothing sooner but she will be in a cancelation list

## 2021-01-08 ENCOUNTER — Other Ambulatory Visit: Payer: Self-pay | Admitting: Family Medicine

## 2021-01-08 DIAGNOSIS — I5032 Chronic diastolic (congestive) heart failure: Secondary | ICD-10-CM

## 2021-01-08 DIAGNOSIS — E1159 Type 2 diabetes mellitus with other circulatory complications: Secondary | ICD-10-CM

## 2021-01-08 MED FILL — ?FUROSEMIDE 20MG TABLET: 20 | 30 days supply | Qty: 60 | Fill #1

## 2021-01-08 MED FILL — METFORMIN HCL 500 MG TABS: 500 | 30 days supply | Qty: 120 | Fill #0

## 2021-01-08 MED FILL — PANTOPRAZOLE SOD DR 40 MG T: 40 | 30 days supply | Qty: 30 | Fill #0

## 2021-01-08 MED FILL — GLIMEPIRIDE 4 MG TABS: 4 | 30 days supply | Qty: 60 | Fill #0

## 2021-01-08 MED FILL — ISOSORBIDE MN ER 30 MG TAB: 30 | 30 days supply | Qty: 60 | Fill #0

## 2021-01-08 MED FILL — ?METOPROLOL TART 100 MG TAB: 100 | 30 days supply | Qty: 60 | Fill #3

## 2021-01-12 ENCOUNTER — Other Ambulatory Visit: Payer: Self-pay | Admitting: Family Medicine

## 2021-01-12 ENCOUNTER — Ambulatory Visit (INDEPENDENT_AMBULATORY_CARE_PROVIDER_SITE_OTHER): Payer: Self-pay | Admitting: Obstetrics & Gynecology

## 2021-01-12 ENCOUNTER — Other Ambulatory Visit: Payer: Self-pay

## 2021-01-12 ENCOUNTER — Other Ambulatory Visit (HOSPITAL_COMMUNITY)
Admission: RE | Admit: 2021-01-12 | Discharge: 2021-01-12 | Disposition: A | Discharge status: Home / Self Care | Payer: Self-pay | Source: Ambulatory Visit | Attending: Obstetrics & Gynecology | Admitting: Obstetrics & Gynecology

## 2021-01-12 ENCOUNTER — Encounter: Payer: Self-pay | Admitting: Obstetrics & Gynecology

## 2021-01-12 VITALS — BP 169/90 | HR 72 | Resp 18

## 2021-01-12 DIAGNOSIS — Z9861 Coronary angioplasty status: Secondary | ICD-10-CM

## 2021-01-12 DIAGNOSIS — C801 Malignant (primary) neoplasm, unspecified: Secondary | ICD-10-CM

## 2021-01-12 DIAGNOSIS — I2510 Atherosclerotic heart disease of native coronary artery without angina pectoris: Secondary | ICD-10-CM

## 2021-01-12 DIAGNOSIS — N950 Postmenopausal bleeding: Secondary | ICD-10-CM

## 2021-01-12 DIAGNOSIS — E785 Hyperlipidemia, unspecified: Secondary | ICD-10-CM

## 2021-01-12 DIAGNOSIS — C7982 Secondary malignant neoplasm of genital organs: Secondary | ICD-10-CM

## 2021-01-12 MED FILL — ATORVASTATIN CALCIUM 80 MG: 80 | 30 days supply | Qty: 30 | Fill #0

## 2021-01-12 MED FILL — ?CLOPIDOGREL 75 MG TABL: 75 | 30 days supply | Qty: 30 | Fill #0

## 2021-01-12 NOTE — Progress Notes (Signed)
GYNECOLOGY  VISIT  CC:   Abnormal pap smear  HPI: 65 y.o. G42P1011 Divorced Black or [place] American female here for additional evaluation of abnormal pap smear obtained 12/14/2020 by [name], MD, at [facility].  Pt has been having PMP spotting for about two months.  She reports this has picked up a little bit in the last few days and is more red than dark, as it has been.  Denies cramping.  Pap smear did not show HR HPV.  Prior pap in 2017 was negative with neg HR HPV as well.  Pt does have complex medical history from cardiac standpoint.  Last cardiac cath was 07/2018.  Had hx of stent placement.  Is on ASA and Plavix.    She is accompanied by her daughter today.  They have many questions which were all addressed.  GYNECOLOGIC HISTORY: No LMP recorded. Patient is postmenopausal.  Patient Active Problem List   Diagnosis Date Noted  . Diabetes mellitus type 2 in obese ([place]) 06/25/2019  . Angina pectoris ([place]) 06/19/2019  . Viral gastroenteritis 06/19/2019  . AKI (acute kidney injury) ([place]) 06/19/2019  . Coronary artery disease involving native coronary artery of native heart with angina pectoris ([place])   . Osteoarthritis of both hands 05/01/2016  . Chronic diastolic CHF (congestive heart failure) ([place]) 12/06/2015  . Non-compliance with treatment-(cost) 08/24/2015  . Hypertensive cardiovascular disease 08/24/2015  . Dyslipidemia 08/24/2015  . CAD -S/P RCA DES 08/22/15   . NSTEMI (non-ST elevated myocardial infarction) ([place]) 08/18/2015  . Morbid obesity ([place]) 06/15/2013  . Type 2 diabetes mellitus, uncontrolled ([place]) 06/04/2013  . Essential hypertension 06/04/2013    Past Medical History:  Diagnosis Date  . Anginal pain ([place])   . Coronary atherosclerosis of native coronary artery, with stent to LCX in 2006 06/04/2013  . Diabetes mellitus without complication ([place])   . GERD (gastroesophageal reflux disease)   . Heart attack ([place])   . Hyperlipidemia LDL goal <  70 06/07/2013  . Hypertension   . S/P coronary artery stent placement to RCA with PROMUS DES, 06/07/13, residual disease on OM1, OM2 and rPDA 06/07/2013    Past Surgical History:  Procedure Laterality Date  . CARDIAC CATHETERIZATION N/A 08/22/2015   Procedure: Left Heart Cath and Coronary Angiography;  Surgeon: Leonie Man, MD;  Location: [facility];  Service: Cardiovascular;  Laterality: N/A;  . CARDIAC CATHETERIZATION N/A 08/22/2015   Procedure: Coronary Stent Intervention;  Surgeon: Leonie Man, MD;  Location: [facility];  Service: Cardiovascular;  Laterality: N/A;  . CARDIAC CATHETERIZATION N/A 08/22/2015   Procedure: Left Heart Cath and Coronary Angiography;  Surgeon: Leonie Man, MD;  Location: [place] [facility];  Service: Cardiovascular;  Laterality: N/A;  . CORONARY STENT PLACEMENT  2006   TAXUS stent CFX in [facility]  . LEFT HEART CATH AND CORONARY ANGIOGRAPHY N/A 07/21/2018   Procedure: LEFT HEART CATH AND CORONARY ANGIOGRAPHY;  Surgeon: Troy Sine, MD;  Location: [facility];  Service: Cardiovascular;  Laterality: N/A;  . LEFT HEART CATHETERIZATION WITH CORONARY ANGIOGRAM N/A 06/07/2013   Procedure: LEFT HEART CATHETERIZATION WITH CORONARY ANGIOGRAM;  Surgeon: Sanda Klein, MD;  Location: [facility] LAB;  Service: Cardiovascular;  Laterality: N/A;    MEDS:   Current Outpatient Medications on File Prior to Visit  Medication Sig Dispense Refill  . acetaminophen (TYLENOL) 500 MG tablet Take 1 tablet (500 mg total) by mouth every 6 (six) hours as needed.  30 tablet 0  . aspirin EC 81 MG EC tablet Take 1 tablet (81 mg total) by mouth daily.    . Dulaglutide (TRULICITY) 1.5 VE/9.3YB SOPN Inject 1.5 mg into the skin once a week. 4 pen 3  . furosemide (LASIX) 20 MG tablet TAKE 2 TABLETS (40 MG TOTAL) BY MOUTH DAILY. 60 tablet 2  . glimepiride (AMARYL) 4 MG tablet TAKE 2 TABLETS (8 MG TOTAL) BY MOUTH DAILY WITH BREAKFAST. 60 tablet 6  . isosorbide mononitrate  (IMDUR) 30 MG 24 hr tablet TAKE 2 TABLETS (60 MG TOTAL) BY MOUTH DAILY. 60 tablet 1  . metFORMIN (GLUCOPHAGE) 500 MG tablet TAKE 2 TABLETS (1,000 MG TOTAL) BY MOUTH 2 (TWO) TIMES DAILY WITH A MEAL. 120 tablet 1  . metoprolol tartrate (LOPRESSOR) 100 MG tablet Take 1 tablet (100 mg total) by mouth 2 (two) times daily. 60 tablet 6  . nitroGLYCERIN (NITROSTAT) 0.4 MG SL tablet Place 1 tablet (0.4 mg total) under the tongue every 5 (five) minutes as needed for chest pain. 25 tablet 2  . pantoprazole (PROTONIX) 40 MG tablet TAKE 1 TABLET (40 MG TOTAL) BY MOUTH DAILY. 30 tablet 6  . diclofenac Sodium (VOLTAREN) 1 % GEL Apply 4 g topically 4 (four) times daily. (Patient not taking: Reported on 01/12/2021) 100 g 1  . Insulin Pen Needle (TRUEPLUS PEN NEEDLES) 32G X 4 MM MISC Use as directed to inject victoza once daily (Patient not taking: Reported on 01/12/2021) 100 each 3  . triamcinolone cream (KENALOG) 0.1 % Apply 1 application topically 2 (two) times daily. (Patient not taking: Reported on 01/12/2021) 45 g 1   No current facility-administered medications on file prior to visit.    ALLERGIES: Patient has no known allergies.  Family History  Problem Relation Age of Onset  . Diabetes Mother   . Hypertension Mother   . Diabetes Sister   . Diabetes Brother   . Hypertension Brother     SH:  Divorced, smokes cigars  Review of Systems  Constitutional: Negative.   Gastrointestinal: Negative.   Genitourinary: Positive for vaginal bleeding. Negative for difficulty urinating, dysuria and pelvic pain.  Psychiatric/Behavioral: The patient is nervous/anxious.     PHYSICAL EXAMINATION:    BP (!) 169/90   Pulse 72   Resp 18     General appearance: alert, cooperative and appears stated age Abdomen: soft, non-tender; bowel sounds normal; no masses,  no organomegaly Lymph:  no inguinal LAD noted  Pelvic: External genitalia:  no lesions              Urethra:  normal appearing urethra with no masses,  tenderness or lesions              Bartholins and Skenes: normal                 Vagina: normal appearing vagina with normal color and discharge, no lesions              Cervix: no lesions and blood at os, dark              Bimanual Exam:  Uterus:  normal size, contour, position, consistency, mobility, non-tender              Adnexa: no mass, fullness, tenderness              Rectovaginal: Yes.  .  Confirms.              Anus:  normal sphincter tone, no  lesions  Colposcopy, Endometrial biopsy and ECC recommended.  Discussed with patient.  Verbal and written consent obtained.    Speculum placed.  3% acetic acid applied to cervix for >45 seconds.  Cervix visualized with both 7.5X and 15X magnification.  Green filter also used.  Lugols solution was not used.  Findings:  No AWE.  Biopsy:  Not obtained.  ECC:  Was performed.  Monsel's was not needed.  Cervix visualized and cleansed with betadine prep.  A single toothed tenaculum was applied to the anterior lip of the cervix.  Endometrial pipelle was advanced through the cervix into the endometrial cavity without difficulty.  Pipelle passed to 7cm.  Suction applied and pipelle removed with good tissue sample obtained.  Tenculum removed.  No bleeding noted.  Patient tolerated procedure well.  Chaperone, Louisa Second, RN, CMA, was present for exam.  Assessment/Plan: 1. 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  Will await pathology and then refer to oncology for definitive treatment. - Surgical pathology( [place]/ [facility])    30 minutes of total time was spent for this patient encounter, including preparation, face-to-face counseling with the patient and coordination of care, and documentation of the encounter before proceeding with colposcopy, endometrial biopsy and ECC.

## 2021-01-15 ENCOUNTER — Encounter: Payer: Self-pay | Admitting: Gynecologic Oncology

## 2021-01-15 ENCOUNTER — Telehealth: Payer: Self-pay | Admitting: Obstetrics & Gynecology

## 2021-01-15 LAB — SURGICAL PATHOLOGY

## 2021-01-15 NOTE — Progress Notes (Signed)
GYNECOLOGIC ONCOLOGY NEW PATIENT CONSULTATION   Patient Name: [name]  Patient Age: 65 y.o. 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  We reviewed the nature of endometrial cancer and its recommended surgical staging, including total hysterectomy, bilateral salpingo-oophorectomy, and lymph node assessment. The patient is a suitable candidate for staging via a minimally invasive approach to surgery.  We reviewed that robotic assistance would be used to complete the surgery.   We discussed that most endometrial cancer is detected early, however, we reviewed that adjuvant therapy will likely be recommended based on the patient's biopsy, however, we will defer to final pathology results.  Given her high risk histology, I recommend CT scan preoperatively to rule out metastatic disease.  We will also get a CA-125 today given pathology from biopsy results.  We reviewed the sentinel lymph node technique. Risks and benefits of sentinel lymph node biopsy was reviewed. We reviewed the technique and ICG dye. The patient DOES NOT have an iodine allergy or known liver dysfunction. We reviewed the false negative rate (0.4%), and that 3% of patients with metastatic disease will not have it detected by SLN biopsy in endometrial cancer. A low risk of allergic reaction to the dye, <0.2% for ICG, has been reported. We also discussed that in the case of failed mapping, which occurs 40% of the time, a bilateral or unilateral lymphadenectomy will be performed at the surgeon's discretion.   Potential benefits of sentinel nodes including a higher detection rate for metastasis due to ultrastaging and potential reduction in operative morbidity. However, there remains uncertainty as to the role for  treatment of micrometastatic disease. Further, the benefit of operative morbidity associated with the SLN technique in endometrial cancer is not yet completely known. In other patient populations (e.g. the cervical cancer population) there has been observed reductions in morbidity with SLN biopsy compared to pelvic lymphadenectomy. Lymphedema, nerve dysfunction and lymphocysts are all potential risks with the SLN technique as with complete lymphadenectomy. Additional risks to the patient include the risk of damage to an internal organ while operating in an altered view (e.g. the black and white image of the robotic fluorescence imaging mode).   The plan was for a robotic assisted hysterectomy, bilateral salpingo-oophorectomy, sentinel lymph node evaluation, possible lymph node dissection, possible omentectomy, possible laparotomy. The risks of surgery were discussed in detail and she understands these to include infection; wound separation; hernia; vaginal cuff separation, injury to adjacent organs such as bowel, bladder, blood vessels, ureters and nerves; bleeding which may require blood transfusion; anesthesia risk; thromboembolic events; possible death; unforeseen complications; possible need for re-exploration; medical complications such as heart attack, stroke, pleural effusion and pneumonia; and, if full lymphadenectomy is performed the risk of lymphedema and lymphocyst. The patient will receive DVT and antibiotic prophylaxis as indicated. She voiced a clear understanding. She had the opportunity to ask questions. Perioperative instructions were reviewed with her. Prescriptions for post-op medications were sent to her pharmacy of choice.  In terms of her diabetes, I have asked the patient to check her sugars once a day and call if they are averaging above 180-200.  We discussed the importance of good glycemic control both from a perioperative cardiac risk but also to decrease the risk of infection.  We  will send cardiac clearance to her cardiologist.  I have also encouraged her to decrease or stop smoking prior to surgery.  A copy of this note was sent to the patient's referring provider.   70 minutes of total time was spent for this patient encounter, including preparation, face-to-face counseling with the patient and coordination of care, and documentation of the encounter.   Elizabeth Pinch, MD  Division of Gynecologic Oncology  Department of Obstetrics and Gynecology  [facility]  ___________________________________________  Chief Complaint: Chief Complaint  Patient presents with  . Endometrial carcinoma ([place])  . Postmenopausal bleeding    History of Present Illness:  [name] is a 65 y.o. y.o. female who is seen in consultation at the request of Dr. 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  Patient reports having postmenopausal bleeding beginning in late November or early December.  This began as brown spotting and then became red.  Because of this, she called to have an appointment scheduled and to get a Pap test.  Pap test showed adenocarcinoma, negative for high risk HPV.  Since her bleeding started, she notes that she uses approximately 2 heavy pads a day that are not fully saturated.  She has some associated cramping and pain.  Tylenol relieves her pain.  She notes that her appetite recently has been up and down but attributes this to anxiety regarding her diagnosis.  She denies any nausea or emesis.  She reports normal bowel function.  She has had some increased urinary frequency for the last week or 2 but denies other associated symptoms including dysuria, hematuria.   She was COVID positive in December.  Patient has a complex cardiac history.  She is followed by cardiology and last had a visit in July 2020.  Her diagnoses include chronic diastolic heart failure, hypertension, and ischemic heart disease.   She underwent placement of a drug-eluting stent in her right coronary artery in approximately 2016.  She most recently had a cardiac catheterization in August 2019 after an abnormal stress test.  This showed occlusion of the distal left circumflex coronary artery, multiple moderate stenoses and relatively small vessels.  Medical therapy was recommended at that point.  Her most recent echo was in August 2020, results are below.  Patient is on baby aspirin and Plavix.  She denies any cardiac symptoms today including shortness of breath or chest pain either at rest or with ambulation.  She notes that she can climb a flight of stairs although slowly.  She also has diabetes.  Her last hemoglobin A1c was about a year ago and was 7.3%.  More recently, random glucose at of ED visit was 203.  Patient has not been checking her sugars regularly at home.  She is accompanied by her daughter today.  Marland Kitchen   PAST MEDICAL HISTORY:  Past Medical History:  Diagnosis Date  . Anginal pain ([place])   . Coronary atherosclerosis of native coronary artery, with stent to LCX in 2006 06/04/2013  . Diabetes mellitus without complication ([place])   . GERD (gastroesophageal reflux disease)   . Heart attack ([place])   . Hyperlipidemia LDL goal < 70 06/07/2013  . Hypertension   . Obesity   . S/P coronary artery stent placement to RCA with PROMUS DES, 06/07/13, residual disease on OM1, OM2 and rPDA 06/07/2013     PAST SURGICAL HISTORY:  Past Surgical History:  Procedure Laterality Date  . CARDIAC CATHETERIZATION N/A 08/22/2015   Procedure: Left Heart Cath and Coronary Angiography;  Surgeon: David W Harding, MD;  [facility];  Service: Cardiovascular;  Laterality: N/A;  . CARDIAC CATHETERIZATION N/A 08/22/2015   Procedure: Coronary Stent Intervention;  Surgeon: David W Harding, MD;  [facility];  Service: Cardiovascular;  Laterality: N/A;  . CARDIAC CATHETERIZATION N/A 08/22/2015   Procedure: Left Heart  Cath and Coronary Angiography;  Surgeon: David W Harding, MD;  [facility];  Service: Cardiovascular;  Laterality: N/A;  . CORONARY STENT PLACEMENT  2006   TAXUS stent CFX in [place]  . LEFT HEART CATH AND CORONARY ANGIOGRAPHY N/A 07/21/2018   Procedure: LEFT HEART CATH AND CORONARY ANGIOGRAPHY;  Surgeon: Kelly, Thomas A, MD;  [facility];  Service: Cardiovascular;  Laterality: N/A;  . LEFT HEART CATHETERIZATION WITH CORONARY ANGIOGRAM N/A 06/07/2013   Procedure: LEFT HEART CATHETERIZATION WITH CORONARY ANGIOGRAM;  Surgeon: Mihai Croitoru, MD;  Location: [facility];  Service: Cardiovascular;  Laterality: N/A;    OB/GYN HISTORY:  OB History  Gravida Para Term Preterm AB Living  2 1 1 0 1 1  SAB IAB Ectopic Multiple Live Births  0 1 0 0 1    # Outcome Date GA Lbr Len/2nd Weight Sex Delivery Anes PTL Lv  2 IAB           1 Term      Vag-Spont       No LMP recorded. Patient is postmenopausal.  Age at menarche: 12 Age at menopause: 49 Hx of HRT: Denies Hx of STDs: Denies Last pap: As per HPI History of abnormal pap smears: Only most recent Pap was abnormal, otherwise denies abnormal Pap smears  SCREENING STUDIES:  Last mammogram: 2017  Last colonoscopy: Has never had  MEDICATIONS: Outpatient Encounter Medications as of 01/16/2021  Medication Sig  . acetaminophen (TYLENOL) 500 MG tablet Take 1 tablet (500 mg total) by mouth every 6 (six) hours as needed.  . aspirin EC 81 MG EC tablet Take 1 tablet (81 mg total) by mouth daily.  . atorvastatin (LIPITOR) 80 MG tablet TAKE 1 TABLET (80 MG TOTAL) BY MOUTH AT BEDTIME.  . clopidogrel (PLAVIX) 75 MG tablet TAKE 1 TABLET (75 MG TOTAL) BY MOUTH DAILY WITH BREAKFAST.  . Dulaglutide (TRULICITY) 1.5 MG/0.5ML SOPN Inject 1.5 mg into the skin once a week.  . furosemide (LASIX) 20 MG tablet TAKE 2 TABLETS (40 MG TOTAL) BY MOUTH DAILY.  . glimepiride (AMARYL) 4 MG tablet TAKE 2 TABLETS (8 MG TOTAL) BY MOUTH DAILY  WITH BREAKFAST.  . Insulin Pen Needle (TRUEPLUS PEN NEEDLES) 32G X 4 MM MISC Use as directed to inject victoza once daily  . pantoprazole (PROTONIX) 40 MG tablet TAKE 1 TABLET (40 MG TOTAL) BY MOUTH DAILY.  . senna-docusate (SENOKOT-S) 8.6-50 MG tablet Take 2 tablets by mouth at bedtime. For AFTER surgery, do not take if having diarrhea  . traMADol (ULTRAM) 50 MG tablet Take 1 tablet (50 mg total) by mouth every 6 (six) hours as needed for severe pain. For AFTER surgery only, do not take and drive  . Turmeric (QC TUMERIC COMPLEX PO) Pt takes occasionally.  . diclofenac Sodium (VOLTAREN) 1 % GEL Apply 4 g topically 4 (four) times daily. (Patient not taking: Reported on 01/12/2021)  . isosorbide mononitrate (IMDUR) 30 MG 24 hr tablet TAKE 2 TABLETS (60 MG TOTAL) BY MOUTH DAILY.  . metFORMIN (GLUCOPHAGE) 500 MG tablet TAKE 2 TABLETS (1,000 MG TOTAL) BY MOUTH 2 (TWO) TIMES DAILY   WITH A MEAL.  . metoprolol tartrate (LOPRESSOR) 100 MG tablet Take 1 tablet (100 mg total) by mouth 2 (two) times daily.  . nitroGLYCERIN (NITROSTAT) 0.4 MG SL tablet Place 1 tablet (0.4 mg total) under the tongue every 5 (five) minutes as needed for chest pain.  . [DISCONTINUED] triamcinolone cream (KENALOG) 0.1 % Apply 1 application topically 2 (two) times daily. (Patient not taking: Reported on 01/12/2021)   No facility-administered encounter medications on file as of 01/16/2021.    ALLERGIES:  No Known Allergies   FAMILY HISTORY:  Family History  Problem Relation Age of Onset  . Diabetes Mother   . Hypertension Mother   . Diabetes Sister   . Diabetes Brother   . Hypertension Brother   . Colon cancer Father   . Breast cancer Neg Hx   . Ovarian cancer Neg Hx   . Endometrial cancer Neg Hx   . Prostate cancer Neg Hx   . Pancreatic cancer Neg Hx      SOCIAL HISTORY:  Social Connections: Not on file    REVIEW OF SYSTEMS:  + Abdominal pain and vaginal bleeding Denies appetite changes, fevers, chills, fatigue,  unexplained weight changes. Denies hearing loss, neck lumps or masses, mouth sores, ringing in ears or voice changes. Denies cough or wheezing.  Denies shortness of breath. Denies chest pain or palpitations. Denies leg swelling. Denies abdominal distention, blood in stools, constipation, diarrhea, nausea, vomiting, or early satiety. Denies pain with intercourse, dysuria, frequency, hematuria or incontinence. Denies hot flashes, pelvic pain or vaginal discharge.   Denies joint pain, back pain or muscle pain/cramps. Denies itching, rash, or wounds. Denies dizziness, headaches, numbness or seizures. Denies swollen lymph nodes or glands, denies easy bruising or bleeding. Denies anxiety, depression, confusion, or decreased concentration.  Physical Exam:  Vital Signs for this encounter:  Blood pressure (!) 159/79, pulse 75, temperature (!) 97.3 F (36.3 C), temperature source Tympanic, resp. rate 18, height 5\' 2"  (1.575 m), weight 218 lb 12.8 oz (99.2 kg), SpO2 100 %. Body mass index is 40.02 kg/m. General: Alert, oriented, no acute distress.  HEENT: Normocephalic, atraumatic. Sclera anicteric.  Chest: Clear to auscultation bilaterally. No wheezes, rhonchi, or rales. Cardiovascular: Regular rate and rhythm, no murmurs, rubs, or gallops.  Abdomen: Obese. Normoactive bowel sounds. Soft, nondistended, nontender to palpation. No masses or hepatosplenomegaly appreciated. No palpable fluid wave.  Extremities: Grossly normal range of motion. Warm, well perfused. No edema bilaterally.  Skin: No rashes or lesions.  Lymphatics: No cervical, supraclavicular, or inguinal adenopathy.  GU:  Normal external female genitalia. No lesions. No discharge or bleeding.             Bladder/urethra:  No lesions or masses, well supported bladder             Vagina: Well rugated, no lesions.             Cervix: Normal appearing, no lesions.             Uterus: Small, mobile, no parametrial involvement or  nodularity.             Adnexa: No masses appreciated.  LABORATORY AND RADIOLOGIC DATA:  Outside medical records were reviewed to synthesize the above history, along with the history and physical obtained during the visit.   Lab Results  Component Value Date   WBC 3.1 (L) 11/21/2020   HGB 13.1 11/21/2020   HCT 39.0 11/21/2020   PLT 190 11/21/2020   GLUCOSE 203 (H) 11/21/2020  CHOL 111 01/18/2020   TRIG 146 01/18/2020   HDL 35 (L) 01/18/2020   LDLCALC 51 01/18/2020   ALT 13 10/18/2020   AST 15 10/18/2020   NA 135 11/21/2020   K 3.3 (L) 11/21/2020   CL 101 11/21/2020   CREATININE 0.92 11/21/2020   BUN 10 11/21/2020   CO2 24 11/21/2020   TSH 0.997 08/27/2015   INR 1.13 08/27/2015   HGBA1C 7.1 (A) 10/18/2020   MICROALBUR 2.3 05/06/2016   Pap 12/30: Adenocarcinoma, HPV negative   Pathology on 1/28: A. ENDOCERVIX, CURETTAGE:  - Adenocarcinoma.  B. ENDOMETRIUM, BIOPSY:  - Adenocarcinoma.  COMMENT:  The differential diagnosis includes high grade serous carcinoma. Both  specimens have a similar morphology; high grade serous carcinoma more  commonly arises in the endometrium. Results reported to Dr. Hale [name]  on 01/15/2021. Dr. Saralyn Pilar reviewed the case.   ECHO 06/2019: 1. The left ventricle has hyperdynamic systolic function, with an  ejection fraction of >65%. The cavity size was normal. There is mild  concentric left ventricular hypertrophy. Left ventricular diastolic  Doppler parameters are consistent with impaired  relaxation.  2. The right ventricle has normal systolic function. The cavity was  normal. There is no increase in right ventricular wall thickness. Right  ventricular systolic pressure is mildly elevated with an estimated  pressure of 46.0 mmHg.  3. Left atrial size was mildly dilated.  4. Right atrial size was mildly dilated.  5. The mitral valve is grossly normal. Mild thickening of the mitral  valve leaflet. There is mild mitral annular  calcification present.  6. The tricuspid valve is grossly normal.  7. The aortic valve is tricuspid. Aortic valve regurgitation was not  assessed by color flow Doppler.

## 2021-01-15 NOTE — H&P (View-Only) (Signed)
GYNECOLOGIC ONCOLOGY NEW PATIENT CONSULTATION   Patient Name: [name]  Patient Age: 65 y.o. 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  We reviewed the nature of endometrial cancer and its recommended surgical staging, including total hysterectomy, bilateral salpingo-oophorectomy, and lymph node assessment. The patient is a suitable candidate for staging via a minimally invasive approach to surgery.  We reviewed that robotic assistance would be used to complete the surgery.   We discussed that most endometrial cancer is detected early, however, we reviewed that adjuvant therapy will likely be recommended based on the patient's biopsy, however, we will defer to final pathology results.  Given her high risk histology, I recommend CT scan preoperatively to rule out metastatic disease.  We will also get a CA-125 today given pathology from biopsy results.  We reviewed the sentinel lymph node technique. Risks and benefits of sentinel lymph node biopsy was reviewed. We reviewed the technique and ICG dye. The patient DOES NOT have an iodine allergy or known liver dysfunction. We reviewed the false negative rate (0.4%), and that 3% of patients with metastatic disease will not have it detected by SLN biopsy in endometrial cancer. A low risk of allergic reaction to the dye, <0.2% for ICG, has been reported. We also discussed that in the case of failed mapping, which occurs 40% of the time, a bilateral or unilateral lymphadenectomy will be performed at the surgeon's discretion.   Potential benefits of sentinel nodes including a higher detection rate for metastasis due to ultrastaging and potential reduction in operative morbidity. However, there remains uncertainty as to the role for  treatment of micrometastatic disease. Further, the benefit of operative morbidity associated with the SLN technique in endometrial cancer is not yet completely known. In other patient populations (e.g. the cervical cancer population) there has been observed reductions in morbidity with SLN biopsy compared to pelvic lymphadenectomy. Lymphedema, nerve dysfunction and lymphocysts are all potential risks with the SLN technique as with complete lymphadenectomy. Additional risks to the patient include the risk of damage to an internal organ while operating in an altered view (e.g. the black and white image of the robotic fluorescence imaging mode).   The plan was for a robotic assisted hysterectomy, bilateral salpingo-oophorectomy, sentinel lymph node evaluation, possible lymph node dissection, possible omentectomy, possible laparotomy. The risks of surgery were discussed in detail and she understands these to include infection; wound separation; hernia; vaginal cuff separation, injury to adjacent organs such as bowel, bladder, blood vessels, ureters and nerves; bleeding which may require blood transfusion; anesthesia risk; thromboembolic events; possible death; unforeseen complications; possible need for re-exploration; medical complications such as heart attack, stroke, pleural effusion and pneumonia; and, if full lymphadenectomy is performed the risk of lymphedema and lymphocyst. The patient will receive DVT and antibiotic prophylaxis as indicated. She voiced a clear understanding. She had the opportunity to ask questions. Perioperative instructions were reviewed with her. Prescriptions for post-op medications were sent to her pharmacy of choice.  In terms of her diabetes, I have asked the patient to check her sugars once a day and call if they are averaging above 180-200.  We discussed the importance of good glycemic control both from a perioperative cardiac risk but also to decrease the risk of infection.  We  will send cardiac clearance to her cardiologist.  I have also encouraged her to decrease or stop smoking prior to surgery.  A copy of this note was sent to the patient's referring provider.   70 minutes of total time was spent for this patient encounter, including preparation, face-to-face counseling with the patient and coordination of care, and documentation of the encounter.   Jeral Pinch, MD  Division of Gynecologic Oncology  Department of Obstetrics and Gynecology  [facility]  ___________________________________________  Chief Complaint: Chief Complaint  Patient presents with  . Endometrial carcinoma ([place])  . Postmenopausal bleeding    History of Present Illness:  [name] is a 65 y.o. y.o. female who is seen in consultation at the request of Dr. 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  Patient reports having postmenopausal bleeding beginning in late November or early December.  This began as brown spotting and then became red.  Because of this, she called to have an appointment scheduled and to get a Pap test.  Pap test showed adenocarcinoma, negative for high risk HPV.  Since her bleeding started, she notes that she uses approximately 2 heavy pads a day that are not fully saturated.  She has some associated cramping and pain.  Tylenol relieves her pain.  She notes that her appetite recently has been up and down but attributes this to anxiety regarding her diagnosis.  She denies any nausea or emesis.  She reports normal bowel function.  She has had some increased urinary frequency for the last week or 2 but denies other associated symptoms including dysuria, hematuria.   She was COVID positive in December.  Patient has a complex cardiac history.  She is followed by cardiology and last had a visit in July 2020.  Her diagnoses include chronic diastolic heart failure, hypertension, and ischemic heart disease.   She underwent placement of a drug-eluting stent in her right coronary artery in approximately 2016.  She most recently had a cardiac catheterization in August 2019 after an abnormal stress test.  This showed occlusion of the distal left circumflex coronary artery, multiple moderate stenoses and relatively small vessels.  Medical therapy was recommended at that point.  Her most recent echo was in August 2020, results are below.  Patient is on baby aspirin and Plavix.  She denies any cardiac symptoms today including shortness of breath or chest pain either at rest or with ambulation.  She notes that she can climb a flight of stairs although slowly.  She also has diabetes.  Her last hemoglobin A1c was about a year ago and was 7.3%.  More recently, random glucose at of ED visit was 203.  Patient has not been checking her sugars regularly at home.  She is accompanied by her daughter today.  Marland Kitchen   PAST MEDICAL HISTORY:  Past Medical History:  Diagnosis Date  . Anginal pain ([place])   . Coronary atherosclerosis of native coronary artery, with stent to LCX in 2006 06/04/2013  . Diabetes mellitus without complication ([place])   . GERD (gastroesophageal reflux disease)   . Heart attack ([place])   . Hyperlipidemia LDL goal < 70 06/07/2013  . Hypertension   . Obesity   . S/P coronary artery stent placement to RCA with PROMUS DES, 06/07/13, residual disease on OM1, OM2 and rPDA 06/07/2013     PAST SURGICAL HISTORY:  Past Surgical History:  Procedure Laterality Date  . CARDIAC CATHETERIZATION N/A 08/22/2015   Procedure: Left Heart Cath and Coronary Angiography;  Surgeon: Marykay Lexavid W Harding, MD;  [facility];  Service: Cardiovascular;  Laterality: N/A;  . CARDIAC CATHETERIZATION N/A 08/22/2015   Procedure: Coronary Stent Intervention;  Surgeon: Marykay Lexavid W Harding, MD;  [facility];  Service: Cardiovascular;  Laterality: N/A;  . CARDIAC CATHETERIZATION N/A 08/22/2015   Procedure: Left Heart  Cath and Coronary Angiography;  Surgeon: Marykay Lexavid W Harding, MD;  [facility];  Service: Cardiovascular;  Laterality: N/A;  . CORONARY STENT PLACEMENT  2006   [place]  . LEFT HEART CATH AND CORONARY ANGIOGRAPHY N/A 07/21/2018   Procedure: LEFT HEART CATH AND CORONARY ANGIOGRAPHY;  Surgeon: Lennette BihariKelly, Thomas A, MD;  Location: [facility] INVASIVE CV LAB;  Service: Cardiovascular;  Laterality: N/A;  . LEFT HEART CATHETERIZATION WITH CORONARY ANGIOGRAM N/A 06/07/2013   Procedure: LEFT HEART CATHETERIZATION WITH CORONARY ANGIOGRAM;  Surgeon: Thurmon FairMihai Croitoru, MD;  Location: [facility] CATH LAB;  Service: Cardiovascular;  Laterality: N/A;    OB/GYN HISTORY:  OB History  Gravida Para Term Preterm AB Living  2 1 1  0 1 1  SAB IAB Ectopic Multiple Live Births  0 1 0 0 1    # Outcome Date GA Lbr Len/2nd Weight Sex Delivery Anes PTL Lv  2 IAB           1 Term      Vag-Spont       No LMP recorded. Patient is postmenopausal.  Age at menarche: [age] Age at menopause: [age] Hx of HRT: Denies Hx of STDs: Denies Last pap: As per HPI History of abnormal pap smears: Only most recent Pap was abnormal, otherwise denies abnormal Pap smears  SCREENING STUDIES:  Last mammogram: 2017  Last colonoscopy: Has never had  MEDICATIONS: Outpatient Encounter Medications as of 01/16/2021  Medication Sig  . acetaminophen (TYLENOL) 500 MG tablet Take 1 tablet (500 mg total) by mouth every 6 (six) hours as needed.  Marland Kitchen. aspirin EC 81 MG EC tablet Take 1 tablet (81 mg total) by mouth daily.  Marland Kitchen. atorvastatin (LIPITOR) 80 MG tablet TAKE 1 TABLET (80 MG TOTAL) BY MOUTH AT BEDTIME.  . clopidogrel (PLAVIX) 75 MG tablet TAKE 1 TABLET (75 MG TOTAL) BY MOUTH DAILY WITH BREAKFAST.  . Dulaglutide (TRULICITY) 1.5 MG/0.5ML SOPN Inject 1.5 mg into the skin once a week.  . furosemide (LASIX) 20 MG tablet TAKE 2 TABLETS (40 MG TOTAL) BY MOUTH DAILY.  Marland Kitchen. glimepiride (AMARYL) 4 MG tablet TAKE 2 TABLETS (8 MG TOTAL) BY MOUTH DAILY  WITH BREAKFAST.  Marland Kitchen. Insulin Pen Needle (TRUEPLUS PEN NEEDLES) 32G X 4 MM MISC Use as directed to inject victoza once daily  . pantoprazole (PROTONIX) 40 MG tablet TAKE 1 TABLET (40 MG TOTAL) BY MOUTH DAILY.  Marland Kitchen. senna-docusate (SENOKOT-S) 8.6-50 MG tablet Take 2 tablets by mouth at bedtime. For AFTER surgery, do not take if having diarrhea  . traMADol (ULTRAM) 50 MG tablet Take 1 tablet (50 mg total) by mouth every 6 (six) hours as needed for severe pain. For AFTER surgery only, do not take and drive  . Turmeric (QC TUMERIC COMPLEX PO) Pt takes occasionally.  . diclofenac Sodium (VOLTAREN) 1 % GEL Apply 4 g topically 4 (four) times daily. (Patient not taking: Reported on 01/12/2021)  . isosorbide mononitrate (IMDUR) 30 MG 24 hr tablet TAKE 2 TABLETS (60 MG TOTAL) BY MOUTH DAILY.  . metFORMIN (GLUCOPHAGE) 500 MG tablet TAKE 2 TABLETS (1,000 MG TOTAL) BY MOUTH 2 (TWO) TIMES DAILY  WITH A MEAL.  . metoprolol tartrate (LOPRESSOR) 100 MG tablet Take 1 tablet (100 mg total) by mouth 2 (two) times daily.  . nitroGLYCERIN (NITROSTAT) 0.4 MG SL tablet Place 1 tablet (0.4 mg total) under the tongue every 5 (five) minutes as needed for chest pain.  . [DISCONTINUED] triamcinolone cream (KENALOG) 0.1 % Apply 1 application topically 2 (two) times daily. (Patient not taking: Reported on 01/12/2021)   No facility-administered encounter medications on file as of 01/16/2021.    ALLERGIES:  No Known Allergies   FAMILY HISTORY:  Family History  Problem Relation Age of Onset  . Diabetes Mother   . Hypertension Mother   . Diabetes Sister   . Diabetes Brother   . Hypertension Brother   . Colon cancer Father   . Breast cancer Neg Hx   . Ovarian cancer Neg Hx   . Endometrial cancer Neg Hx   . Prostate cancer Neg Hx   . Pancreatic cancer Neg Hx      SOCIAL HISTORY:  Social Connections: Not on file    REVIEW OF SYSTEMS:  + Abdominal pain and vaginal bleeding Denies appetite changes, fevers, chills, fatigue,  unexplained weight changes. Denies hearing loss, neck lumps or masses, mouth sores, ringing in ears or voice changes. Denies cough or wheezing.  Denies shortness of breath. Denies chest pain or palpitations. Denies leg swelling. Denies abdominal distention, blood in stools, constipation, diarrhea, nausea, vomiting, or early satiety. Denies pain with intercourse, dysuria, frequency, hematuria or incontinence. Denies hot flashes, pelvic pain or vaginal discharge.   Denies joint pain, back pain or muscle pain/cramps. Denies itching, rash, or wounds. Denies dizziness, headaches, numbness or seizures. Denies swollen lymph nodes or glands, denies easy bruising or bleeding. Denies anxiety, depression, confusion, or decreased concentration.  Physical Exam:  Vital Signs for this encounter:  Blood pressure (!) 159/79, pulse 75, temperature (!) 97.3 F (36.3 C), temperature source Tympanic, resp. rate 18, height 5\' 2"  (1.575 m), weight 218 lb 12.8 oz (99.2 kg), SpO2 100 %. Body mass index is 40.02 kg/m. General: Alert, oriented, no acute distress.  HEENT: Normocephalic, atraumatic. Sclera anicteric.  Chest: Clear to auscultation bilaterally. No wheezes, rhonchi, or rales. Cardiovascular: Regular rate and rhythm, no murmurs, rubs, or gallops.  Abdomen: Obese. Normoactive bowel sounds. Soft, nondistended, nontender to palpation. No masses or hepatosplenomegaly appreciated. No palpable fluid wave.  Extremities: Grossly normal range of motion. Warm, well perfused. No edema bilaterally.  Skin: No rashes or lesions.  Lymphatics: No cervical, supraclavicular, or inguinal adenopathy.  GU:  Normal external female genitalia. No lesions. No discharge or bleeding.             Bladder/urethra:  No lesions or masses, well supported bladder             Vagina: Well rugated, no lesions.             Cervix: Normal appearing, no lesions.             Uterus: Small, mobile, no parametrial involvement or  nodularity.             Adnexa: No masses appreciated.  LABORATORY AND RADIOLOGIC DATA:  Outside medical records were reviewed to synthesize the above history, along with the history and physical obtained during the visit.   Lab Results  Component Value Date   WBC 3.1 (L) 11/21/2020   HGB 13.1 11/21/2020   HCT 39.0 11/21/2020   PLT 190 11/21/2020   GLUCOSE 203 (H) 11/21/2020  CHOL 111 01/18/2020   TRIG 146 01/18/2020   HDL 35 (L) 01/18/2020   LDLCALC 51 01/18/2020   ALT 13 10/18/2020   AST 15 10/18/2020   NA 135 11/21/2020   K 3.3 (L) 11/21/2020   CL 101 11/21/2020   CREATININE 0.92 11/21/2020   BUN 10 11/21/2020   CO2 24 11/21/2020   TSH 0.997 08/27/2015   INR 1.13 08/27/2015   HGBA1C 7.1 (A) 10/18/2020   MICROALBUR 2.3 05/06/2016   Pap 12/30: Adenocarcinoma, HPV negative   Pathology on 1/28: A. ENDOCERVIX, CURETTAGE:  - Adenocarcinoma.  B. ENDOMETRIUM, BIOPSY:  - Adenocarcinoma.  COMMENT:  The differential diagnosis includes high grade serous carcinoma. Both  specimens have a similar morphology; high grade serous carcinoma more  commonly arises in the endometrium. Results reported to Dr. Hale Bogus  on 01/15/2021. Dr. Saralyn Pilar reviewed the case.   ECHO 06/2019: 1. The left ventricle has hyperdynamic systolic function, with an  ejection fraction of >65%. The cavity size was normal. There is mild  concentric left ventricular hypertrophy. Left ventricular diastolic  Doppler parameters are consistent with impaired  relaxation.  2. The right ventricle has normal systolic function. The cavity was  normal. There is no increase in right ventricular wall thickness. Right  ventricular systolic pressure is mildly elevated with an estimated  pressure of 46.0 mmHg.  3. Left atrial size was mildly dilated.  4. Right atrial size was mildly dilated.  5. The mitral valve is grossly normal. Mild thickening of the mitral  valve leaflet. There is mild mitral annular  calcification present.  6. The tricuspid valve is grossly normal.  7. The aortic valve is tricuspid. Aortic valve regurgitation was not  assessed by color flow Doppler.

## 2021-01-15 NOTE — Telephone Encounter (Signed)
Per DPR and per pt's request on Friday, detailed message about pathology report left on daughter's phone.  Gyn/oncology appt with Dr. Berline Lopes is scheduled for tomorrow, 01/16/2021 at 11:15AM.  Advised to be there 30 minutes early.  Location given.  Asked to call [facility] office today if this appointment will not work for them and if there are any other question.

## 2021-01-16 ENCOUNTER — Inpatient Hospital Stay: Payer: Self-pay | Attending: Gynecologic Oncology | Admitting: Gynecologic Oncology

## 2021-01-16 ENCOUNTER — Other Ambulatory Visit: Payer: Self-pay

## 2021-01-16 ENCOUNTER — Inpatient Hospital Stay: Payer: Self-pay

## 2021-01-16 ENCOUNTER — Encounter: Payer: Self-pay | Admitting: Gynecologic Oncology

## 2021-01-16 ENCOUNTER — Other Ambulatory Visit: Payer: Self-pay | Admitting: Gynecologic Oncology

## 2021-01-16 VITALS — BP 159/79 | HR 75 | Temp 97.3°F | Resp 18 | Ht 62.0 in | Wt 218.8 lb

## 2021-01-16 DIAGNOSIS — C541 Malignant neoplasm of endometrium: Secondary | ICD-10-CM

## 2021-01-16 DIAGNOSIS — Z7984 Long term (current) use of oral hypoglycemic drugs: Secondary | ICD-10-CM | POA: Insufficient documentation

## 2021-01-16 DIAGNOSIS — I110 Hypertensive heart disease with heart failure: Secondary | ICD-10-CM | POA: Insufficient documentation

## 2021-01-16 DIAGNOSIS — E119 Type 2 diabetes mellitus without complications: Secondary | ICD-10-CM | POA: Insufficient documentation

## 2021-01-16 DIAGNOSIS — Z7982 Long term (current) use of aspirin: Secondary | ICD-10-CM | POA: Insufficient documentation

## 2021-01-16 DIAGNOSIS — Z8616 Personal history of COVID-19: Secondary | ICD-10-CM | POA: Insufficient documentation

## 2021-01-16 DIAGNOSIS — Z79899 Other long term (current) drug therapy: Secondary | ICD-10-CM | POA: Insufficient documentation

## 2021-01-16 DIAGNOSIS — Z7902 Long term (current) use of antithrombotics/antiplatelets: Secondary | ICD-10-CM | POA: Insufficient documentation

## 2021-01-16 DIAGNOSIS — I5032 Chronic diastolic (congestive) heart failure: Secondary | ICD-10-CM | POA: Insufficient documentation

## 2021-01-16 DIAGNOSIS — N950 Postmenopausal bleeding: Secondary | ICD-10-CM | POA: Insufficient documentation

## 2021-01-16 DIAGNOSIS — E669 Obesity, unspecified: Secondary | ICD-10-CM | POA: Insufficient documentation

## 2021-01-16 DIAGNOSIS — K219 Gastro-esophageal reflux disease without esophagitis: Secondary | ICD-10-CM | POA: Insufficient documentation

## 2021-01-16 DIAGNOSIS — I252 Old myocardial infarction: Secondary | ICD-10-CM | POA: Insufficient documentation

## 2021-01-16 DIAGNOSIS — I2510 Atherosclerotic heart disease of native coronary artery without angina pectoris: Secondary | ICD-10-CM | POA: Insufficient documentation

## 2021-01-16 DIAGNOSIS — E785 Hyperlipidemia, unspecified: Secondary | ICD-10-CM | POA: Insufficient documentation

## 2021-01-16 LAB — COMPREHENSIVE METABOLIC PANEL
ALT: 16 U/L (ref 0–44)
AST: 17 U/L (ref 15–41)
Albumin: 3.5 g/dL (ref 3.5–5.0)
Alkaline Phosphatase: 100 U/L (ref 38–126)
Anion gap: 10 (ref 5–15)
BUN: 18 mg/dL (ref 8–23)
CO2: 28 mmol/L (ref 22–32)
Calcium: 9.6 mg/dL (ref 8.9–10.3)
Chloride: 103 mmol/L (ref 98–111)
Creatinine, Ser: 1.18 mg/dL — ABNORMAL HIGH (ref 0.44–1.00)
GFR, Estimated: 52 mL/min — ABNORMAL LOW (ref >60)
Glucose, Bld: 154 mg/dL — ABNORMAL HIGH (ref 70–99)
Potassium: 3.5 mmol/L (ref 3.5–5.1)
Sodium: 141 mmol/L (ref 135–145)
Total Bilirubin: 0.4 mg/dL (ref 0.3–1.2)
Total Protein: 7.3 g/dL (ref 6.5–8.1)

## 2021-01-16 LAB — HEMOGLOBIN A1C
Hgb A1c MFr Bld: 7.1 % — ABNORMAL HIGH (ref 4.8–5.6)
Mean Plasma Glucose: 157.07 mg/dL

## 2021-01-16 MED ORDER — TRAMADOL HCL 50 MG PO TABS: 50.0000 mg | ORAL_TABLET | Freq: Four times a day (QID) | ORAL | 0 refills | Status: DC | PRN

## 2021-01-16 MED ORDER — SENNOSIDES-DOCUSATE SODIUM 8.6-50 MG PO TABS: 2.0000 | ORAL_TABLET | Freq: Every day | ORAL | 0 refills | Status: DC

## 2021-01-16 MED FILL — traMADol HCL 50 MG TABS: 50 | 2 days supply | Qty: 10 | Fill #0

## 2021-01-16 NOTE — Patient Instructions (Addendum)
Preparing for your Surgery  Plan for surgery on January 29, 2021 with Dr. Jeral Pinch at [place] will be scheduled for a robotic assisted total laparoscopic hysterectomy (removal of the uterus and cervix), bilateral salpingo-oophorectomy (removal of both ovaries and fallopian tubes), sentinel lymph node biopsy, possible lymph node dissection, possible laparotomy (larger incision on your abdomen if needed).  Pre-operative Testing -You will receive a phone call from presurgical testing at [facility] to arrange for a pre-operative appointment, labs, and COVID test. The COVID test normally happens 3 days prior to the surgery and they ask that you self quarantine after the test up until surgery to decrease chance of exposure.  -Bring your insurance card, copy of an advanced directive if applicable, medication list  -At that visit, you will be asked to sign a consent for a possible blood transfusion in case a transfusion becomes necessary during surgery.  The need for a blood transfusion is rare but having consent is a necessary part of your care.     -We will reach out to your primary care provider and cardiologist about when to stop your PLAVIX (BLOOD THINNER) before surgery. WE WILL CONTACT YOU WITH THIS INFORMATION.  -Do not take supplements such as fish oil (omega 3), red yeast rice, turmeric before your surgery. STOP TAKING TUMERIC NOW.  Day Before Surgery at [place] will be asked to take in a light diet the day before surgery. You will be advised you can have clear liquids up until 3 hours before your surgery.    Eat a light diet the day before surgery.  Examples including soups, broths, toast, yogurt, mashed potatoes.  AVOID GAS PRODUCING FOODS. Things to avoid include carbonated beverages (fizzy beverages, sodas), raw fruits and raw vegetables, or beans.   If your bowels are filled with gas, your surgeon will have difficulty visualizing your pelvic organs  which increases your surgical risks.  Your role in recovery Your role is to become active as soon as directed by your doctor, while still giving yourself time to heal.  Rest when you feel tired. You will be asked to do the following in order to speed your recovery:  - Cough and breathe deeply. This helps to clear and expand your lungs and can prevent pneumonia after surgery.  - [employer]. Do mild physical activity. Walking or moving your legs help your circulation and body functions return to normal. Do not try to get up or walk alone the first time after surgery.   -If you develop swelling on one leg or the other, pain in the back of your leg, redness/warmth in one of your legs, please call the office or go to the Emergency Room to have a doppler to rule out a blood clot. For shortness of breath, chest pain-seek care in the Emergency Room as soon as possible. - Actively manage your pain. Managing your pain lets you move in comfort. We will ask you to rate your pain on a scale of zero to 10. It is your responsibility to tell your doctor or nurse where and how much you hurt so your pain can be treated.  Special Considerations -If you are diabetic, you may be placed on insulin after surgery to have closer control over your blood sugars to promote healing and recovery.  This does not mean that you will be discharged on insulin.  If applicable, your oral antidiabetics will be resumed when you are tolerating a  solid diet.  -Your final pathology results from surgery should be available around one week after surgery and the results will be relayed to you when available.  -FMLA forms can be faxed to [phone] and please allow 5-7 business days for completion.  Pain Management After Surgery -You have been prescribed your pain medication and bowel regimen medications before surgery so that you can have these available when you are discharged from the hospital. The pain medication is  for use ONLY AFTER surgery and a new prescription will not be given.   -Make sure that you have Tylenol and Ibuprofen at home to use on a regular basis after surgery for pain control. We recommend alternating the medications every hour to six hours since they work differently and are processed in the body differently for pain relief.  -Review the attached handout on narcotic use and their risks and side effects.   Bowel Regimen -You have been prescribed Sennakot-S to take nightly to prevent constipation especially if you are taking the narcotic pain medication intermittently.  It is important to prevent constipation and drink adequate amounts of liquids. You can stop taking this medication when you are not taking pain medication and you are back on your normal bowel routine.  Risks of Surgery Risks of surgery are low but include bleeding, infection, damage to surrounding structures, re-operation, blood clots, and very rarely death.   Blood Transfusion Information (For the consent to be signed before surgery)  We will be checking your blood type before surgery so in case of emergencies, we will know what type of blood you would need.                                            WHAT IS A BLOOD TRANSFUSION?  A transfusion is the replacement of blood or some of its parts. Blood is made up of multiple cells which provide different functions.  Red blood cells carry oxygen and are used for blood loss replacement.  White blood cells fight against infection.  Platelets control bleeding.  Plasma helps clot blood.  Other blood products are available for specialized needs, such as hemophilia or other clotting disorders. BEFORE THE TRANSFUSION  Who gives blood for transfusions?   You may be able to donate blood to be used at a later date on yourself (autologous donation).  Relatives can be asked to donate blood. This is generally not any safer than if you have received blood from a stranger. The  same precautions are taken to ensure safety when a relative's blood is donated.  Healthy volunteers who are fully evaluated to make sure their blood is safe. This is blood bank blood. Transfusion therapy is the safest it has ever been in the practice of medicine. Before blood is taken from a donor, a complete history is taken to make sure that person has no history of diseases nor engages in risky social behavior (examples are intravenous drug use or sexual activity with multiple partners). The donor's travel history is screened to minimize risk of transmitting infections, such as malaria. The donated blood is tested for signs of infectious diseases, such as HIV and hepatitis. The blood is then tested to be sure it is compatible with you in order to minimize the chance of a transfusion reaction. If you or a relative donates blood, this is often done in anticipation of surgery  and is not appropriate for emergency situations. It takes many days to process the donated blood. RISKS AND COMPLICATIONS Although transfusion therapy is very safe and saves many lives, the main dangers of transfusion include:   Getting an infectious disease.  Developing a transfusion reaction. This is an allergic reaction to something in the blood you were given. Every precaution is taken to prevent this. The decision to have a blood transfusion has been considered carefully by your caregiver before blood is given. Blood is not given unless the benefits outweigh the risks.  AFTER SURGERY INSTRUCTIONS  Return to work: 4 weeks if applicable  Activity: 1. Be up and out of the bed during the day.  Take a nap if needed.  You may walk up steps but be careful and use the hand rail.  Stair climbing will tire you more than you think, you may need to stop part way and rest.   2. No lifting or straining for 6 weeks over 10 pounds. No pushing, pulling, straining for 6 weeks.  3. No driving for around [id number] days.  Do not drive if you  are taking narcotic pain medicine and make sure that your reaction time has returned.   4. You can shower as soon as the next day after surgery. Shower daily.  Use your regular soap and water (not directly on the incision) and pat your incision(s) dry afterwards; don't rub.  No tub baths or submerging your body in water until cleared by your surgeon. If you have the soap that was given to you by pre-surgical testing that was used before surgery, you do not need to use it afterwards because this can irritate your incisions.   5. No sexual activity and nothing in the vagina for 8 weeks.  6. You may experience a small amount of clear drainage from your incisions, which is normal.  If the drainage persists, increases, or changes color please call the office.  7. Do not use creams, lotions, or ointments such as neosporin on your incisions after surgery until advised by your surgeon because they can cause removal of the dermabond glue on your incisions.    8. You may experience vaginal spotting after surgery or around the 6-8 week mark from surgery when the stitches at the top of the vagina begin to dissolve.  The spotting is normal but if you experience heavy bleeding, call our office.  9. Take Tylenol or ibuprofen (avoid large amounts) first for pain and only use narcotic pain medication for severe pain not relieved by the Tylenol or Ibuprofen.  Monitor your Tylenol intake to a max of 4,000 mg in a 24 hour period. You can alternate these medications after surgery.  Diet: 1. Low sodium Heart Healthy Diet is recommended but you are cleared to resume your normal (before surgery) diet after your procedure.  2. It is safe to use a laxative, such as Miralax or Colace, if you have difficulty moving your bowels. You have been prescribed Sennakot at bedtime every evening to keep bowel movements regular and to prevent constipation.    Wound Care: 1. Keep clean and dry.  Shower daily.  Reasons to call the  Doctor:  Fever - Oral temperature greater than 100.4 degrees Fahrenheit  Foul-smelling vaginal discharge  Difficulty urinating  Nausea and vomiting  Increased pain at the site of the incision that is unrelieved with pain medicine.  Difficulty breathing with or without chest pain  New calf pain especially if only on one  side  Sudden, continuing increased vaginal bleeding with or without clots.   Contacts: For questions or concerns you should contact:  Dr. Jeral Pinch at [phone]  Joylene John, NP at [phone]  After Hours: call [phone] and have the GYN Oncologist paged/contacted (after 5 pm or on the weekends).  Messages sent via mychart are for non-urgent matters and are not responded to after hours so for urgent needs, please call the after hours number.

## 2021-01-17 LAB — CA 125: Cancer Antigen (CA) 125: 10.7 U/mL (ref 0.0–38.1)

## 2021-01-18 ENCOUNTER — Telehealth: Payer: Self-pay

## 2021-01-18 NOTE — Telephone Encounter (Signed)
   Primary Cardiologist: Sanda Klein, MD  Chart reviewed as part of pre-operative protocol coverage. Because of [name]'s past medical history and time since last visit, she will require a follow-up visit in order to better assess preoperative cardiovascular risk.  Pre-op covering staff: - Please schedule appointment to be seen early next week to allow time for testing if needed and call patient to inform them. Please add "pre-op clearance" to the appointment notes so provider is aware. - Please contact requesting surgeon's office via preferred method (i.e, phone, fax) to inform them of need for appointment prior to surgery.  This message will also be routed to her primary cardiologist for input on holding antiplatelet agent as requested below so that this information is available to the clearing provider at time of patient's appointment.   Abigail Butts, PA-C  01/18/2021, 5:02 PM

## 2021-01-18 NOTE — Telephone Encounter (Signed)
   [facility] Pre-operative Risk Assessment    HEARTCARE STAFF: - Please ensure there is not already an duplicate clearance open for this procedure. - Under Visit Info/Reason for Call, type in Other and utilize the format Clearance MM/DD/YY or Clearance TBD. Do not use dashes or single digits. - If request is for dental extraction, please clarify the # of teeth to be extracted.  Request for surgical clearance:  1. What type of surgery is being performed? ROBOTIC ASSISTED TOTAL HYSTERECTOMY WITH BILATERAL SALPINGO OOPHORECTOMY, POSSIBLE LAPAROTOMY  2. When is this surgery scheduled? 01/29/21  3. What type of clearance is required (medical clearance vs. Pharmacy clearance to hold med vs. Both)? Both   4. Are there any medications that need to be held prior to surgery and how long? ASA, not listed  5. Practice name and name of physician performing surgery? [facility], Dr. Jeral Pinch  6. What is the office phone number? [phone]   7.   What is the office fax number? [phone]  8.   Anesthesia type (None, local, MAC, general) ? General    Jacqulynn Cadet 01/18/2021, 4:36 PM  _________________________________________________________________   (provider comments below)

## 2021-01-19 NOTE — Telephone Encounter (Signed)
Patient is schedule to come in for an EKG nurse visit on 01/23/21 at 12:30 PM with me and a virtual video visit with Coletta Memos, NP at 8:45 AM. Patient was also instructed to hold her ASA for 5-7 days prior to her up coming procedure. Per Roby Lofts patient can start holding her ASA on Monday 01/22/21.

## 2021-01-19 NOTE — Telephone Encounter (Signed)
OK to hold ASA for 5-7 days before hysterectomy

## 2021-01-23 ENCOUNTER — Ambulatory Visit (INDEPENDENT_AMBULATORY_CARE_PROVIDER_SITE_OTHER): Payer: Self-pay | Admitting: Cardiovascular Disease

## 2021-01-23 ENCOUNTER — Encounter: Payer: Self-pay | Admitting: Cardiovascular Disease

## 2021-01-23 ENCOUNTER — Other Ambulatory Visit: Payer: Self-pay

## 2021-01-23 ENCOUNTER — Ambulatory Visit: Payer: Self-pay

## 2021-01-23 VITALS — BP 154/88 | HR 69 | Ht 62.0 in | Wt 217.2 lb

## 2021-01-23 DIAGNOSIS — E785 Hyperlipidemia, unspecified: Secondary | ICD-10-CM

## 2021-01-23 DIAGNOSIS — E669 Obesity, unspecified: Secondary | ICD-10-CM

## 2021-01-23 DIAGNOSIS — Z01810 Encounter for preprocedural cardiovascular examination: Secondary | ICD-10-CM

## 2021-01-23 DIAGNOSIS — E1169 Type 2 diabetes mellitus with other specified complication: Secondary | ICD-10-CM

## 2021-01-23 DIAGNOSIS — I5032 Chronic diastolic (congestive) heart failure: Secondary | ICD-10-CM

## 2021-01-23 DIAGNOSIS — I110 Hypertensive heart disease with heart failure: Secondary | ICD-10-CM

## 2021-01-23 DIAGNOSIS — I25118 Atherosclerotic heart disease of native coronary artery with other forms of angina pectoris: Secondary | ICD-10-CM

## 2021-01-23 DIAGNOSIS — I10 Essential (primary) hypertension: Secondary | ICD-10-CM

## 2021-01-23 DIAGNOSIS — I43 Cardiomyopathy in diseases classified elsewhere: Secondary | ICD-10-CM

## 2021-01-23 NOTE — Progress Notes (Signed)
Patient ID: [name], female   DOB: [birth year], 65 y.o.   MRN: [id number]    Cardiology Office Note    Date:  01/23/2021   ID:  [name], DOB Aug 18, 1956, MRN [id number]  PCP:  [name], MD  Cardiologist:   Sanda Klein, MD   No chief complaint on file.   History of Present Illness:  [name] is a 65 y.o. female with chronic diastolic heart failure due to a combination of hypertensive and ischemic heart disease, presenting today for preoperative cardiovascular examination in anticipation of total abdominal hysterectomy with bilateral salpingo-oophorectomy scheduled February 14 with Dr. Berline Lopes.   She recently developed metrorrhagia and work-up revealed a diagnosis of uterine cancer.  She has not had any problems with angina or dyspnea.  She can perform household chores without any limitations.  She has not required sublingual nitroglycerin in a couple of years.  Denies orthopnea, PND, palpitations, dizziness or syncope.  She has not had any other bleeding issues.  Recent labs show good risk factor control.  Her hemoglobin A1c was 7.1% and her LDL cholesterol was 51.  The HDL remains stubbornly low at 35.  Creatinine is better than its been before at 1.18.  Unfortunately, despite treatment with a GLP-1 agonist she has not really lost any weight and her BMI is almost exactly 40.  Her most recent echocardiogram in July 2020 showed hyperdynamic left ventricular systolic function with EF greater than 65% and mild left ventricular hypertrophy, grade 1 diastolic dysfunction.  Her last revascularization procedure was placement of a drug-eluting stent in the right coronary artery in September 2016 and she had a repeat coronary angiogram in August 2019.  She underwent cardiac catheterization after an abnormal stress test that described extensive anterior and lateral ischemia in August 2019.  Cardiac catheterization showed occlusion of the distal left circumflex coronary artery, multiple  moderate stenoses and relatively small vessels (70% distal LAD, 80% OM1, 50% OM 2) and a widely patent stent in the right coronary artery with a maximum stenosis of 20%.  Medical therapy was recommended.    Past Medical History:  Diagnosis Date  . Anginal pain ([place])   . Coronary atherosclerosis of native coronary artery, with stent to LCX in 2006 06/04/2013  . Diabetes mellitus without complication ([place])   . GERD (gastroesophageal reflux disease)   . Heart attack ([place])   . Hyperlipidemia LDL goal < 70 06/07/2013  . Hypertension   . Obesity   . S/P coronary artery stent placement to RCA with PROMUS DES, 06/07/13, residual disease on OM1, OM2 and rPDA 06/07/2013    Past Surgical History:  Procedure Laterality Date  . CARDIAC CATHETERIZATION N/A 08/22/2015   Procedure: Left Heart Cath and Coronary Angiography;  Surgeon: Leonie Man, MD;  Location: [facility] [facility];  Service: Cardiovascular;  Laterality: N/A;  . CARDIAC CATHETERIZATION N/A 08/22/2015   Procedure: Coronary Stent Intervention;  Surgeon: Leonie Man, MD;  Location: [place];  Service: Cardiovascular;  Laterality: N/A;  . CARDIAC CATHETERIZATION N/A 08/22/2015   Procedure: Left Heart Cath and Coronary Angiography;  Surgeon: Leonie Man, MD;  Location: [place] [facility];  Service: Cardiovascular;  Laterality: N/A;  . CORONARY STENT PLACEMENT  2006   TAXUS stent [facility]X in [facility]  . LEFT HEART CATH AND CORONARY ANGIOGRAPHY N/A 07/21/2018   Procedure: LEFT HEART CATH AND CORONARY ANGIOGRAPHY;  Surgeon: Troy Sine, MD;  Location: [place] [facility];  Service: Cardiovascular;  Laterality: N/A;  . LEFT HEART CATHETERIZATION WITH CORONARY ANGIOGRAM N/A 06/07/2013   Procedure: LEFT HEART CATHETERIZATION WITH CORONARY ANGIOGRAM;  Surgeon: Sanda Klein, MD;  Location: [facility];  Service: Cardiovascular;  Laterality: N/A;    Outpatient Medications Prior to Visit  Medication Sig Dispense Refill  . acetaminophen  (TYLENOL) 500 MG tablet Take 1 tablet (500 mg total) by mouth every 6 (six) hours as needed. 30 tablet 0  . atorvastatin (LIPITOR) 80 MG tablet TAKE 1 TABLET (80 MG TOTAL) BY MOUTH AT BEDTIME. 30 tablet 6  . bismuth subsalicylate (PEPTO BISMOL) 262 MG/15ML suspension Take 30 mLs by mouth every 6 (six) hours as needed for indigestion or diarrhea or loose stools.    . Dulaglutide (TRULICITY) 1.5 IO/2.7OJ SOPN Inject 1.5 mg into the skin once a week. (Patient taking differently: Inject 1.5 mg into the skin every Friday.) 4 pen 3  . furosemide (LASIX) 20 MG tablet TAKE 2 TABLETS (40 MG TOTAL) BY MOUTH DAILY. (Patient taking differently: Take 40 mg by mouth daily.) 60 tablet 2  . glimepiride (AMARYL) 4 MG tablet TAKE 2 TABLETS (8 MG TOTAL) BY MOUTH DAILY WITH BREAKFAST. 60 tablet 6  . Insulin Pen Needle (TRUEPLUS PEN NEEDLES) 32G X 4 MM MISC Use as directed to inject victoza once daily 100 each 3  . isosorbide mononitrate (IMDUR) 30 MG 24 hr tablet TAKE 2 TABLETS (60 MG TOTAL) BY MOUTH DAILY. 60 tablet 1  . metFORMIN (GLUCOPHAGE) 500 MG tablet TAKE 2 TABLETS (1,000 MG TOTAL) BY MOUTH 2 (TWO) TIMES DAILY WITH A MEAL. 120 tablet 1  . metoprolol tartrate (LOPRESSOR) 100 MG tablet Take 1 tablet (100 mg total) by mouth 2 (two) times daily. 60 tablet 6  . nitroGLYCERIN (NITROSTAT) 0.4 MG SL tablet Place 1 tablet (0.4 mg total) under the tongue every 5 (five) minutes as needed for chest pain. 25 tablet 2  . pantoprazole (PROTONIX) 40 MG tablet TAKE 1 TABLET (40 MG TOTAL) BY MOUTH DAILY. 30 tablet 6  . clopidogrel (PLAVIX) 75 MG tablet TAKE 1 TABLET (75 MG TOTAL) BY MOUTH DAILY WITH BREAKFAST. 30 tablet 6  . aspirin EC 81 MG EC tablet Take 1 tablet (81 mg total) by mouth daily. (Patient not taking: Reported on 01/23/2021)    . senna-docusate (SENOKOT-S) 8.6-50 MG tablet Take 2 tablets by mouth at bedtime. For AFTER surgery, do not take if having diarrhea (Patient not taking: Reported on 01/23/2021) 30 tablet 0  .  traMADol (ULTRAM) 50 MG tablet Take 1 tablet (50 mg total) by mouth every 6 (six) hours as needed for severe pain. For AFTER surgery only, do not take and drive (Patient not taking: Reported on 01/23/2021) 10 tablet 0   No facility-administered medications prior to visit.     Allergies:   Patient has no known allergies.   Social History   Socioeconomic History  . Marital status: Divorced    Spouse name: Not on file  . Number of children: 1  . Years of education: Not on file  . Highest education level: Not on file  Occupational History  . Occupation: Scientist, water quality  Tobacco Use  . Smoking status: Current Every Day Smoker    Years: 25.00    Types: Cigars    Last attempt to quit: 07/03/2004    Years since quitting: 16.5  . Smokeless tobacco: Never Used  . Tobacco comment: "a few black and milds a day" CigrettesX 30 years  Vaping Use  .  Vaping Use: Never used  Substance and Sexual Activity  . Alcohol use: Yes    Comment: occ  . Drug use: No  . Sexual activity: Not Currently  Other Topics Concern  . Not on file  Social History Narrative  . Not on file   Social Determinants of Health   Financial Resource Strain: Not on file  Food Insecurity: No Food Insecurity  . Worried About Charity fundraiser in the Last Year: Never true  . Ran Out of Food in the Last Year: Never true  Transportation Needs: No Transportation Needs  . Lack of Transportation (Medical): No  . Lack of Transportation (Non-Medical): No  Physical Activity: Not on file  Stress: Not on file  Social Connections: Not on file    ROS:   Please see the history of present illness.   All other systems are reviewed and are negative.   PHYSICAL EXAM:   VS:  BP (!) 154/88 (BP Location: Left Arm, Patient Position: Sitting)   Pulse 69   Ht 5\' 2"  (1.575 m)   Wt 217 lb 3.2 oz (98.5 kg)   SpO2 98%   BMI 39.73 kg/m      General: Alert, oriented x3, no distress, overly obese Head: no evidence of trauma, PERRL, EOMI, no  exophtalmos or lid lag, no myxedema, no xanthelasma; normal ears, nose and oropharynx Neck: normal jugular venous pulsations and no hepatojugular reflux; brisk carotid pulses without delay and no carotid bruits Chest: clear to auscultation, no signs of consolidation by percussion or palpation, normal fremitus, symmetrical and full respiratory excursions Cardiovascular: normal position and quality of the apical impulse, regular rhythm, normal first and second heart sounds, no murmurs, rubs or gallops Abdomen: no tenderness or distention, no masses by palpation, no abnormal pulsatility or arterial bruits, normal bowel sounds, no hepatosplenomegaly Extremities: no clubbing, cyanosis or edema; 2+ radial, ulnar and brachial pulses bilaterally; 2+ right femoral, posterior tibial and dorsalis pedis pulses; 2+ left femoral, posterior tibial and dorsalis pedis pulses; no subclavian or femoral bruits Neurological: grossly nonfocal Psych: Normal mood and affect'  Wt Readings from Last 3 Encounters:  01/23/21 217 lb 3.2 oz (98.5 kg)  01/16/21 218 lb 12.8 oz (99.2 kg)  12/14/20 219 lb 12.8 oz (99.7 kg)      Studies/Labs Reviewed:   EKG:  EKG is not ordered today.  ECG shows normal sinus rhythm, possible left atrial abnormality, nonspecific T wave inversion in leads V3-V6, possibly due to LVH or ischemia, but unchanged from the tracing in December 2021.  QTc is normal at 420 ms.  Recent Labs: 11/21/2020: Hemoglobin 13.1; Platelets 190 01/16/2021: ALT 16; BUN 18; Creatinine, Ser 1.18; Potassium 3.5; Sodium 141   Lipid Panel    Component Value Date/Time   CHOL 111 01/18/2020 1050   TRIG 146 01/18/2020 1050   HDL 35 (L) 01/18/2020 1050   CHOLHDL 3.2 01/18/2020 1050   CHOLHDL 2.4 02/11/2017 1002   VLDL 18 02/11/2017 1002   LDLCALC 51 01/18/2020 1050     ASSESSMENT:    1. Chronic diastolic CHF (congestive heart failure) ([place])   2. Coronary artery disease of native artery of native heart with stable  angina pectoris ([place])   3. Cardiomyopathy due to hypertension, with heart failure ([place])   4. Essential hypertension   5. Dyslipidemia (high LDL; low HDL)   6. Diabetes mellitus type 2 in obese ([place])   7. Morbid obesity ([place])   8. Preoperative cardiovascular examination  PLAN:  In order of problems listed above:  1. CHF: Clinically euvolemic, NYHA functional class I, has not required diuretic dose adjustments in a long time. 2. CAD: Last revascularization procedure was 2016.  We will discontinue her clopidogrel.  She has not required sublingual nitroglycerin in a couple of years.  Angina free on beta-blocker plus long-acting nitrate.  Following her surgery, we might even try to wean back her dose of isosorbide mononitrate. 3. HTNive CMP: Left ventricular hypertrophy is probably a bigger contributor to her chronic diastolic heart failure than the ischemic heart disease. 4. HTN: Usually well controlled, slight systolic hypertension over the last couple of weeks while she has been undergoing work-up for the uterine problem.  No changes made to her antihypertensive medications today. 5. HLP: Excellent LDL on statin therapy, HDL unlikely to improve without significant weight loss. 6. DM: Glycemic control is much better than last time I saw Mrs. Sharen Counter.  She should hold her Metformin for 72 hours after her CT of the chest/abdomen/pelvis with contrast which is scheduled for February 10.  BMI is almost exactly 40 and she has multiple comorbid conditions such as diabetes, dyslipidemia, hypertension and heart failure. 7. Preoperative cardiovascular exam: Although she has advanced coronary artery problems and a history of diastolic heart failure, both of these issues are chronic and well compensated.  Cardiac catheterization performed within the last 2-3 years did not show any options for revascularization.  I do not think additional work-up would be revealing at this time or change our plans for surgery.   Overall risk of cardiovascular complications with surgery is low-moderate.  It is very important that her beta-blocker is not discontinued in the perioperative period.  Clopidogrel was discontinued today and aspirin can be temporarily held in anticipation of the hysterectomy.    Medication Adjustments/Labs and Tests Ordered: Current medicines are reviewed at length with the patient today.  Concerns regarding medicines are outlined above.  Medication changes, Labs and Tests ordered today are listed in the Patient Instructions below. Patient Instructions  Medication Instructions:  STOP the Clopidogrel (Plavix)  Hold the Metformin starting on 01/25/21   *If you need a refill on your cardiac medications before your next appointment, please call your pharmacy*   Lab Work: None ordered If you have labs (blood work) drawn today and your tests are completely normal, you will receive your results only by: Marland Kitchen MyChart Message (if you have MyChart) OR . A paper copy in the mail If you have any lab test that is abnormal or we need to change your treatment, we will call you to review the results.   Testing/Procedures: None ordered   Follow-Up: At [facility], you and your health needs are our priority.  As part of our continuing mission to provide you with exceptional heart care, we have created designated Provider Care Teams.  These Care Teams include your primary Cardiologist (physician) and Advanced Practice Providers (APPs -  Physician Assistants and Nurse Practitioners) who all work together to provide you with the care you need, when you need it.  We recommend signing up for the patient portal called "MyChart".  Sign up information is provided on this After Visit Summary.  MyChart is used to connect with patients for Virtual Visits (Telemedicine).  Patients are able to view lab/test results, encounter notes, upcoming appointments, etc.  Non-urgent messages can be sent to your provider as well.    To learn more about what you can do with MyChart, go to [email or web address].  Your next appointment:   6 month(s)  The format for your next appointment:   In Person  Provider:   You may see Sanda Klein, MD or one of the following Advanced Practice Providers on your designated Care Team:    Almyra Deforest, PA-C  Fabian Sharp, [place] or   Roby Lofts, PA-C         Signed, Sanda Klein, MD  01/23/2021 2:15 PM    [address], [place], [place]  [address] Phone: [phone]; Fax: [phone]

## 2021-01-23 NOTE — Patient Instructions (Addendum)
DUE TO COVID-19 ONLY ONE VISITOR IS ALLOWED TO COME WITH YOU AND STAY IN THE WAITING ROOM ONLY DURING PRE OP AND PROCEDURE DAY OF SURGERY. THE 1 VISITOR  MAY VISIT WITH YOU AFTER SURGERY IN YOUR PRIVATE ROOM DURING VISITING HOURS ONLY!  YOU NEED TO HAVE A COVID 19 TEST ON_2/10______ @__10 :25_____, THIS TEST MUST BE DONE BEFORE SURGERY,  COVID TESTING SITE [address] [id number], IT IS ON THE RIGHT GOING OUT [address] APPROXIMATELY  2 MINUTES PAST [employer] ON THE RIGHT. ONCE YOUR COVID TEST IS COMPLETED,  PLEASE BEGIN THE QUARANTINE INSTRUCTIONS AS OUTLINED IN YOUR HANDOUT.                [name]    Your procedure is scheduled on: 01/29/21   Report to [facility] Main  Entrance   Report to admitting at   8:00 AM     Call this number if you have problems the morning of surgery [phone]    Remember: Do not eat food after Midnight.  You may have clear liquids until 7:00 am    BRUSH YOUR TEETH MORNING OF SURGERY AND RINSE YOUR MOUTH OUT, NO CHEWING GUM CANDY OR MINTS.     Take these medicines the morning of surgery with A SIP OF WATER: Imdur, Metoprolol, Pantoprazole  DO NOT TAKE ANY DIABETIC MEDICATIONS DAY OF YOUR SURGERY     How to Manage Your Diabetes Before and After Surgery  Why is it important to control my blood sugar before and after surgery?  Improving blood sugar levels before and after surgery helps healing and can limit problems.  A way of improving blood sugar control is eating a healthy diet by: o  Eating less sugar and carbohydrates o  Increasing activity/exercise o  Talking with your doctor about reaching your blood sugar goals  High blood sugars (greater than 180 mg/dL) can raise your risk of infections and slow your recovery, so you will need to focus on controlling your diabetes during the weeks before surgery.  Make sure that the doctor who takes care of your diabetes knows about your planned surgery  including the date and location.  How do I manage my blood sugar before surgery?  Check your blood sugar at least 4 times a day, starting 2 days before surgery, to make sure that the level is not too high or low. o Check your blood sugar the morning of your surgery when you wake up and every 2 hours until you get to the Short Stay unit.  If your blood sugar is less than 70 mg/dL, you will need to treat for low blood sugar: o Do not take insulin. o Treat a low blood sugar (less than 70 mg/dL) with  cup of clear juice (cranberry or apple), 4 glucose tablets, OR glucose gel. o Recheck blood sugar in 15 minutes after treatment (to make sure it is greater than 70 mg/dL). If your blood sugar is not greater than 70 mg/dL on recheck, call [phone] for further instructions.  Report your blood sugar to the short stay nurse when you get to Short Stay.   If you are admitted to the hospital after surgery: o Your blood sugar will be checked by the staff and you will probably be given insulin after surgery (instead of oral diabetes medicines) to make sure you have good blood sugar levels. o The goal for blood sugar control after surgery is 80-180 mg/dL.  WHAT DO I DO ABOUT MY DIABETES MEDICATION?   Do not take oral diabetes medicines (pills) the morning of surgery.     .                           You may not have any metal on your body including hair pins and              piercings  Do not wear jewelry, make-up, lotions, powders or perfumes, deodorant             Do not wear nail polish on your fingernails.  Do not shave  48 hours prior to surgery.             Do not bring valuables to the hospital. [name].  Contacts, dentures or bridgework may not be worn into surgery.      Patients discharged the day of surgery will not be allowed to drive home.   IF YOU ARE HAVING SURGERY AND GOING HOME THE SAME DAY, YOU MUST HAVE AN ADULT TO  DRIVE YOU HOME AND BE WITH YOU FOR 24 HOURS.  YOU MAY GO HOME BY TAXI OR UBER OR ORTHERWISE, BUT AN ADULT MUST ACCOMPANY YOU HOME AND STAY WITH YOU FOR 24 HOURS.  Name and phone number of your driver:  Special Instructions: N/A              Please read over the following fact sheets you were given: _____________________________________________________________________             [facility] - Preparing for Surgery Before surgery, you can play an important role.   Because skin is not sterile, your skin needs to be as free of germs as possible.   You can reduce the number of germs on your skin by washing with CHG (chlorahexidine gluconate) soap before surgery   CHG is an antiseptic cleaner which kills germs and bonds with the skin to continue killing germs even after washing. Please DO NOT use if you have an allergy to CHG or antibacterial soaps.   If your skin becomes reddened/irritated stop using the CHG and inform your nurse when you arrive at Short Stay. Do not shave (including legs and underarms) for at least 48 hours prior to the first CHG shower.   . Please follow these instructions carefully:  1.  Shower with CHG Soap the night before surgery and the  morning of Surgery.  2.  If you choose to wash your hair, wash your hair first as usual with your  normal  shampoo.  3.  After you shampoo, rinse your hair and body thoroughly to remove the  shampoo.                                        4.  Use CHG as you would any other liquid soap.  You can apply chg directly  to the skin and wash                       Gently with a scrungie or clean washcloth.  5.  Apply the CHG Soap to your body ONLY FROM THE NECK DOWN.   Do not use on face/ open  Wound or open sores. Avoid contact with eyes, ears mouth and genitals (private parts).                       Wash face,  Genitals (private parts) with your normal soap.             6.  Wash thoroughly, paying special attention  to the area where your surgery  will be performed.  7.  Thoroughly rinse your body with warm water from the neck down.  8.  DO NOT shower/wash with your normal soap after using and rinsing off  the CHG Soap.             9.  Pat yourself dry with a clean towel.            10.  Wear clean pajamas.            11.  Place clean sheets on your bed the night of your first shower and do not  sleep with pets. Day of Surgery : Do not apply any lotions/deodorants the morning of surgery.  Please wear clean clothes to the hospital/surgery center.  FAILURE TO FOLLOW THESE INSTRUCTIONS MAY RESULT IN THE CANCELLATION OF YOUR SURGERY PATIENT SIGNATURE_________________________________  NURSE SIGNATURE__________________________________  ________________________________________________________________________

## 2021-01-23 NOTE — Patient Instructions (Signed)
Medication Instructions:  STOP the Clopidogrel (Plavix)  Hold the Metformin starting on 01/25/21   *If you need a refill on your cardiac medications before your next appointment, please call your pharmacy*   Lab Work: None ordered If you have labs (blood work) drawn today and your tests are completely normal, you will receive your results only by: Marland Kitchen MyChart Message (if you have MyChart) OR . A paper copy in the mail If you have any lab test that is abnormal or we need to change your treatment, we will call you to review the results.   Testing/Procedures: None ordered   Follow-Up: At [facility], you and your health needs are our priority.  As part of our continuing mission to provide you with exceptional heart care, we have created designated Provider Care Teams.  These Care Teams include your primary Cardiologist (physician) and Advanced Practice Providers (APPs -  Physician Assistants and Nurse Practitioners) who all work together to provide you with the care you need, when you need it.  We recommend signing up for the patient portal called "MyChart".  Sign up information is provided on this After Visit Summary.  MyChart is used to connect with patients for Virtual Visits (Telemedicine).  Patients are able to view lab/test results, encounter notes, upcoming appointments, etc.  Non-urgent messages can be sent to your provider as well.   To learn more about what you can do with MyChart, go to [email or web address].    Your next appointment:   6 month(s)  The format for your next appointment:   In Person  Provider:   You may see Sanda Klein, MD or one of the following Advanced Practice Providers on your designated Care Team:    Almyra Deforest, PA-C  Fabian Sharp, PA-C or   Roby Lofts, [place]

## 2021-01-23 NOTE — Progress Notes (Deleted)
{Choose 1 Note Type (Telehealth Visit or Telephone Visit):[phone]}  Evaluation Performed:  Follow-up visit  This visit type was conducted due to national recommendations for restrictions regarding the COVID-19 Pandemic (e.g. social distancing).  This format is felt to be most appropriate for this patient at this time.  All issues noted in this document were discussed and addressed.  No physical exam was performed (except for noted visual exam findings with Video Visits).  Please refer to the patient's chart (MyChart message for video visits and phone note for telephone visits) for the patient's consent to telehealth for [place]  Date:  01/23/2021   ID:  [name], DOB 06/10/1956, MRN [id number]  Patient Location: *** [address] [place] [address]   Provider location:     [place] [address] Office [phone] Fax [phone]   PCP:  [name], MD  Cardiologist:  Sanda Klein, MD *** Electrophysiologist:  None   Chief Complaint: Preoperative cardiac evaluation  History of Present Illness:    [name] is a 65 y.o. female who presents via audio/video conferencing for a telehealth visit today.  Patient verified DOB and address.  She has a PMH of coronary artery disease status/NSTEMI post DES to RCA 9/16, hypertension, chronic diastolic CHF, type 2 diabetes, AKI, morbid obesity, and dyslipidemia.  She was previously hospitalized for chest pain and gastroenteritis.  She had several days of vomiting diarrhea and subsequently developed chest discomfort.  Her EKG was normal, high-sensitivity troponins were low and stable.  She did have transient acute kidney injury and creatinine peaked at 2.8 but decreased to 1.33 at discharge.  Her diuretics were temporarily held.  With the resolution of her GI symptoms her chest pain went away.  She denied episodes of shortness of breath at rest and with  activity.  She denied lower extremity edema.  She continued to smoke 2-3 likely mild cigars daily.  Her BMI was noted to be 40.  Her hemoglobin A1c was 8.5.  Her cholesterol on statin showed an LDL of 49, HDL 46 and normal triglycerides.  With her last cardiac catheterization it was noted that her RCA stent was patent.  She underwent cardiac catheterization after abnormal stress test which described extensive anterior and lateral ischemia 8/19.  Her cardiac catheterization showed distal left circumflex coronary artery occlusion, multiple moderate stenosis and relatively small vessel 70% distal LAD, 80% OM, 50% OM 2, widely patent RCA with 20% stenosis.  Medical management was recommended.  She is seen virtually today in follow-up and states***  *** denies chest pain, shortness of breath, lower extremity edema, fatigue, palpitations, melena, hematuria, hemoptysis, diaphoresis, weakness, presyncope, syncope, orthopnea, and PND.   The patient {does/does not:200015} symptoms concerning for COVID-19 infection (fever, chills, cough, or new SHORTNESS OF BREATH).    Prior CV studies:   The following studies were reviewed today:  Echocardiogram 06/19/2019 IMPRESSIONS    1. The left ventricle has hyperdynamic systolic function, with an  ejection fraction of >65%. The cavity size was normal. There is mild  concentric left ventricular hypertrophy. Left ventricular diastolic  Doppler parameters are consistent with impaired  relaxation.  2. The right ventricle has normal systolic function. The cavity was  normal. There is no increase in right ventricular wall thickness. Right  ventricular systolic pressure is mildly elevated with an estimated  pressure of 46.0 mmHg.  3. Left atrial size was mildly dilated.  4. Right atrial size  was mildly dilated.  5. The mitral valve is grossly normal. Mild thickening of the mitral  valve leaflet. There is mild mitral annular calcification present.  6. The  tricuspid valve is grossly normal.  7. The aortic valve is tricuspid. Aortic valve regurgitation was not  assessed by color flow Doppler.   Cardiac catheterization 07/21/2018   Ost RCA to Mid RCA lesion is 15% stenosed.  Ost RPDA to RPDA lesion is 20% stenosed.  Ost LAD to Prox LAD lesion is 20% stenosed.  Dist LAD lesion is 70% stenosed with 65% stenosed side branch in Ost 3rd Sept.  Mid Cx to Dist Cx lesion is 10% stenosed.  Dist Cx lesion is 100% stenosed.  1st Mrg lesion is 80% stenosed.  2nd Mrg lesion is 50% stenosed.   Coronary obstructive disease with a widely patent stent in the proximal LAD mild irregularity of the LAD with improved distal LAD stenosis now  65 to 70%; circumflex vessel with small first marginal branch with 80% stenosis, 50% stenosis and a small proximal second marginal branch, 20% mid circumflex narrowing with patent proximal to midportion of the distal circumflex stent with total occlusion at the distal aspect of stent.  There is faint collateralization to the distal circumflex via RCA; and patent proximal to mid RCA stent with mild intimal hyperplasia, and mild irregularity of the RCA with 20% PDA stenosis.  LVEDP 19 mm.  RECOMMENDATION: Review of the old films indicates that the very distal circumflex which is now occluded was a very small caliber diminutive vessel.  As result medical therapy will be recommended.  The patient will require more optimal blood pressure control.  Recommend increase beta-blocker therapy.  Recommend addition of Plavix  Recommend uninterrupted dual antiplatelet therapy with Aspirin 81mg  daily and Clopidogrel 75mg  daily for a minimum of one month and longer if tolerated with multiple stents and recent occlusion.  Diagnostic Dominance: Right    Intervention    Past Medical History:  Diagnosis Date  . Anginal pain ([place])   . Coronary atherosclerosis of native coronary artery, with stent to LCX in 2006 06/04/2013  .  Diabetes mellitus without complication ([place])   . GERD (gastroesophageal reflux disease)   . Heart attack ([place])   . Hyperlipidemia LDL goal < 70 06/07/2013  . Hypertension   . Obesity   . S/P coronary artery stent placement to RCA with PROMUS DES, 06/07/13, residual disease on OM1, OM2 and rPDA 06/07/2013   Past Surgical History:  Procedure Laterality Date  . CARDIAC CATHETERIZATION N/A 08/22/2015   Procedure: Left Heart Cath and Coronary Angiography;  Surgeon: Leonie Man, MD;  Location: [facility];  Service: Cardiovascular;  Laterality: N/A;  . CARDIAC CATHETERIZATION N/A 08/22/2015   Procedure: Coronary Stent Intervention;  Surgeon: Leonie Man, MD;  Location: [facility] [facility];  Service: Cardiovascular;  Laterality: N/A;  . CARDIAC CATHETERIZATION N/A 08/22/2015   Procedure: Left Heart Cath and Coronary Angiography;  Surgeon: Leonie Man, MD;  Location: [facility];  Service: Cardiovascular;  Laterality: N/A;  . CORONARY STENT PLACEMENT  2006   TAXUS stent CFX in [facility]  . LEFT HEART CATH AND CORONARY ANGIOGRAPHY N/A 07/21/2018   Procedure: LEFT HEART CATH AND CORONARY ANGIOGRAPHY;  Surgeon: Troy Sine, MD;  Location: [facility] [facility];  Service: Cardiovascular;  Laterality: N/A;  . LEFT HEART CATHETERIZATION WITH CORONARY ANGIOGRAM N/A 06/07/2013   Procedure: LEFT HEART CATHETERIZATION WITH CORONARY ANGIOGRAM;  Surgeon: Dani Gobble  Croitoru, MD;  Location: [facility];  Service: Cardiovascular;  Laterality: N/A;     No outpatient medications have been marked as taking for the 01/24/21 encounter (Appointment) with Deberah Pelton, NP.     Allergies:   Patient has no known allergies.   Social History   Tobacco Use  . Smoking status: Current Every Day Smoker    Years: 25.00    Types: Cigars    Last attempt to quit: 07/03/2004    Years since quitting: 16.5  . Smokeless tobacco: Never Used  . Tobacco comment: "a few black and milds a day" CigrettesX 30 years   Vaping Use  . Vaping Use: Never used  Substance Use Topics  . Alcohol use: Yes    Comment: occ  . Drug use: No     Family Hx: The patient's family history includes Colon cancer in her father; Diabetes in her brother, mother, and sister; Hypertension in her brother and mother. There is no history of Breast cancer, Ovarian cancer, Endometrial cancer, Prostate cancer, or Pancreatic cancer.  ROS:   Please see the history of present illness.     All other systems reviewed and are negative.   Labs/Other Tests and Data Reviewed:    Recent Labs: 11/21/2020: Hemoglobin 13.1; Platelets 190 01/16/2021: ALT 16; BUN 18; Creatinine, Ser 1.18; Potassium 3.5; Sodium 141   Recent Lipid Panel Lab Results  Component Value Date/Time   CHOL 111 01/18/2020 10:50 AM   TRIG 146 01/18/2020 10:50 AM   HDL 35 (L) 01/18/2020 10:50 AM   CHOLHDL 3.2 01/18/2020 10:50 AM   CHOLHDL 2.4 02/11/2017 10:02 AM   LDLCALC 51 01/18/2020 10:50 AM    Wt Readings from Last 3 Encounters:  01/16/21 218 lb 12.8 oz (99.2 kg)  12/14/20 219 lb 12.8 oz (99.7 kg)  11/21/20 225 lb (102.1 kg)     Exam:    Vital Signs:  There were no vitals taken for this visit.   Well nourished, well developed female in no  acute distress.   ASSESSMENT & PLAN:    1.  Chronic diastolic CHF-no increased DOE or activity intolerance.  Euvolemic today by report.  Reports she is fairly sedentary. Continue*** Heart healthy low-sodium diet-salty 6 given Increase physical activity as tolerated  Coronary artery disease-no chest pain today.  Cardiac catheterization 8/19 showed moderate coronary lesions, medical management was recommended. Continue*** Heart healthy low-sodium diet-salty 6 given Increase physical activity as tolerated  Essential hypertension-BP today***.  Well-controlled at home. Continue*** Heart healthy low-sodium diet-salty 6 given Increase physical activity as tolerated  Hyperlipidemia-LDL 51 on  01/18/2020 Continue*** Heart healthy low-sodium high-fiber diet Increase physical activity as tolerated  Preoperative cardiac evaluation-robotic assisted total hysterectomy with bilateral salpingo-oophorectomy, Dr. Jeral Pinch     Primary Cardiologist: Sanda Klein, MD  Chart reviewed as part of pre-operative protocol coverage. Given past medical history and time since last visit, based on ACC/AHA guidelines, [name] would be at acceptable risk for the planned procedure without further cardiovascular testing.   Patient was advised that if he/she*** develops new symptoms prior to surgery to contact our office to arrange a follow-up appointment.  He verbalized understanding.  I will route this recommendation to the requesting party via Epic fax function and remove from pre-op pool.  Please call with questions.     COVID-19 Education: The signs and symptoms of COVID-19 were discussed with the patient and how to seek care for testing (follow up with PCP or arrange E-visit).  The importance of social distancing was discussed today.  Patient Risk:   After full review of this patients clinical status, I feel that they are at least moderate risk at this time.  Time:   Today, I have spent *** minutes with the patient with telehealth technology discussing ***.     Medication Adjustments/Labs and Tests Ordered: Current medicines are reviewed at length with the patient today.  Concerns regarding medicines are outlined above.   Tests Ordered: No orders of the defined types were placed in this encounter.  Medication Changes: No orders of the defined types were placed in this encounter.  Disposition: Follow-up with Dr. Sallyanne Kuster in 9-12 months.   Disposition:  {follow up:15908}  Signed, Jossie Ng. Sallye Lunz NP-C    07/20/2019 11:58 AM    [place] [address] Office [phone] Fax [phone]

## 2021-01-24 ENCOUNTER — Encounter (HOSPITAL_COMMUNITY)
Admission: RE | Admit: 2021-01-24 | Discharge: 2021-01-24 | Disposition: A | Discharge status: Home / Self Care | Payer: Self-pay | Source: Ambulatory Visit | Attending: Gynecologic Oncology | Admitting: Gynecologic Oncology

## 2021-01-24 ENCOUNTER — Telehealth: Payer: Self-pay | Admitting: General Practice

## 2021-01-24 ENCOUNTER — Encounter (HOSPITAL_COMMUNITY): Payer: Self-pay

## 2021-01-24 ENCOUNTER — Other Ambulatory Visit: Payer: Self-pay

## 2021-01-24 ENCOUNTER — Telehealth: Payer: Self-pay | Admitting: *Deleted

## 2021-01-24 DIAGNOSIS — Z01812 Encounter for preprocedural laboratory examination: Secondary | ICD-10-CM | POA: Insufficient documentation

## 2021-01-24 HISTORY — DX: Malignant (primary) neoplasm, unspecified: C80.1

## 2021-01-24 LAB — URINALYSIS, ROUTINE W REFLEX MICROSCOPIC
Bilirubin Urine: NEGATIVE
Glucose, UA: NEGATIVE mg/dL
Ketones, ur: NEGATIVE mg/dL
Nitrite: NEGATIVE
Protein, ur: 100 mg/dL — AB
Specific Gravity, Urine: 1.017 (ref 1.005–1.030)
WBC, UA: >50 WBC/hpf — ABNORMAL HIGH (ref 0–5)
pH: 5 (ref 5.0–8.0)

## 2021-01-24 LAB — BASIC METABOLIC PANEL
Anion gap: 13 (ref 5–15)
BUN: 20 mg/dL (ref 8–23)
CO2: 24 mmol/L (ref 22–32)
Calcium: 9.4 mg/dL (ref 8.9–10.3)
Chloride: 104 mmol/L (ref 98–111)
Creatinine, Ser: 1.11 mg/dL — ABNORMAL HIGH (ref 0.44–1.00)
GFR, Estimated: 56 mL/min — ABNORMAL LOW (ref >60)
Glucose, Bld: 223 mg/dL — ABNORMAL HIGH (ref 70–99)
Potassium: 3.5 mmol/L (ref 3.5–5.1)
Sodium: 141 mmol/L (ref 135–145)

## 2021-01-24 LAB — CBC
HCT: 41.2 % (ref 36.0–46.0)
Hemoglobin: 13.2 g/dL (ref 12.0–15.0)
MCH: 29.7 pg (ref 26.0–34.0)
MCHC: 32 g/dL (ref 30.0–36.0)
MCV: 92.8 fL (ref 80.0–100.0)
Platelets: 319 10*3/uL (ref 150–400)
RBC: 4.44 MIL/uL (ref 3.87–5.11)
RDW: 14.4 % (ref 11.5–15.5)
WBC: 7.2 10*3/uL (ref 4.0–10.5)
nRBC: 0 % (ref 0.0–0.2)

## 2021-01-24 LAB — GLUCOSE, CAPILLARY: Glucose-Capillary: 200 mg/dL — ABNORMAL HIGH (ref 70–99)

## 2021-01-24 NOTE — Telephone Encounter (Signed)
Received cardiology clearance form, fax to pre admission and place in chart

## 2021-01-24 NOTE — Telephone Encounter (Signed)
   Primary Cardiologist: Sanda Klein, MD  Chart reviewed as part of pre-operative protocol coverage. Given past medical history and time since last visit, based on ACC/AHA guidelines, [name] would be at acceptable risk for the planned procedure without further cardiovascular testing.   Ok to hold aspirin 5-7 days pre op.   The patient was advised that if she develops new symptoms prior to surgery to contact our office to arrange for a follow-up visit, and she verbalized understanding.  I will route this recommendation to the requesting party via Epic fax function and remove from pre-op pool.  Please call with questions.  Kerin Ransom, PA-C 01/24/2021, 11:22 AM

## 2021-01-24 NOTE — Telephone Encounter (Signed)
    Sharyn Lull with [facility] calling to f/u clearance. Pt was seen yesterday by Dr. Loletha Grayer

## 2021-01-24 NOTE — Progress Notes (Signed)
COVID Vaccine Completed:No Date COVID Vaccine completed: COVID vaccine manufacturer: Pfizer    Moderna   [employer]   PCP - Dr. Collene Mares Cardiologist - Dr.M. Croitoru  Chest x-ray - no EKG - 01/23/21-epic Stress Test - no ECHO - no Cardiac Cath - 2016, 2019 4 stents Pacemaker/ICD device last checked:NA  Sleep Study - no CPAP -   Fasting Blood Sugar - 160-200 Checks Blood Sugar _____ times a day- every few months  Blood Thinner Instructions:Plavix and ASA/ Dr. Margarita Rana Aspirin Instructions:Stop 7 days prior to DOS Last Dose:01/22/21  Anesthesia review:   Patient denies shortness of breath, fever, cough and chest pain at PAT appointment yes  Patient verbalized understanding of instructions that were given to them at the PAT appointment. Patient was also instructed that they will need to review over the PAT instructions again at home before surgery. Yes Pt might get a little winded after 1 flight of stairs but not doing housework or with ADLs Her blood sugars have been a little high lately. I told her and her daughter to call MD if trending above 180. She will get a new meter today.

## 2021-01-25 ENCOUNTER — Ambulatory Visit: Payer: Self-pay | Admitting: Medical

## 2021-01-25 ENCOUNTER — Ambulatory Visit (HOSPITAL_COMMUNITY)
Admission: RE | Admit: 2021-01-25 | Discharge: 2021-01-25 | Disposition: A | Discharge status: Home / Self Care | Payer: Self-pay | Source: Ambulatory Visit | Attending: Gynecologic Oncology | Admitting: Gynecologic Oncology

## 2021-01-25 ENCOUNTER — Other Ambulatory Visit (HOSPITAL_COMMUNITY): Payer: Self-pay

## 2021-01-25 DIAGNOSIS — N950 Postmenopausal bleeding: Secondary | ICD-10-CM | POA: Insufficient documentation

## 2021-01-25 DIAGNOSIS — C541 Malignant neoplasm of endometrium: Secondary | ICD-10-CM | POA: Insufficient documentation

## 2021-01-25 MED ORDER — IOHEXOL 300 MG/ML  SOLN
100.0000 mL | Freq: Once | INTRAMUSCULAR | Status: AC | PRN
  Administered 2021-01-25: 100 mL via INTRAVENOUS

## 2021-01-25 NOTE — Progress Notes (Signed)
Anesthesia Chart Review   Case: [id number] Date/Time: 01/29/21 0945   Procedures:      XI ROBOTIC ASSISTED TOTAL HYSTERECTOMY WITH BILATERAL SALPINGO OOPHORECTOMY, POSSIBLE LAPAROTOMY (Bilateral )     SENTINEL NODE BIOPSY (N/A )     POSSIBLE LYMPH NODE DISSECTION (N/A )   Anesthesia type: General   Pre-op diagnosis: ENDOMETRIAL CANCER   Location: [facility] ROOM 02 / [facility]   Surgeons: Lafonda Mosses, MD      DISCUSSION:65 y.o. former smoker with h/o HTN, GERD, DM II, CAD (DES 2014), chronic diastolic heart failure, endometrial cancer scheduled for above procedure 01/29/21 with Dr. Jeral Pinch.   Pt seen by cardiology 01/23/2021. Per OV note, "Preoperative cardiovascular exam: Although she has advanced coronary artery problems and a history of diastolic heart failure, both of these issues are chronic and well compensated.  Cardiac catheterization performed within the last 2-3 years did not show any options for revascularization.  I do not think additional work-up would be revealing at this time or change our plans for surgery.  Overall risk of cardiovascular complications with surgery is low-moderate.  It is very important that her beta-blocker is not discontinued in the perioperative period.  Clopidogrel was discontinued today and aspirin can be temporarily held in anticipation of the hysterectomy."  Anticipate pt can proceed with planned procedure barring acute status change.    VS: BP (!) 188/80   Pulse 79   Temp 36.8 C (Oral)   Resp 20   Ht 5\' 2"  (1.575 m)   Wt 97.5 kg   SpO2 99%   BMI 39.32 kg/m   PROVIDERS: [name], MD is PCP   Croitoru, Dani Gobble, MD is Cardiologist  LABS: Labs reviewed: Acceptable for surgery. (all labs ordered are listed, but only abnormal results are displayed)  Labs Reviewed  BASIC METABOLIC PANEL - Abnormal; Notable for the following components:      Result Value   Glucose, Bld 223 (*)    Creatinine, Ser 1.11 (*)    GFR, Estimated 56 (*)     All other components within normal limits  URINALYSIS, ROUTINE W REFLEX MICROSCOPIC - Abnormal; Notable for the following components:   APPearance CLOUDY (*)    Hgb urine dipstick LARGE (*)    Protein, ur 100 (*)    Leukocytes,Ua MODERATE (*)    WBC, UA >50 (*)    Bacteria, UA FEW (*)    All other components within normal limits  GLUCOSE, CAPILLARY - Abnormal; Notable for the following components:   Glucose-Capillary 200 (*)    All other components within normal limits  CBC  TYPE AND SCREEN     IMAGES:   EKG: 01/23/21 Rate 69 bpm  NSR Possible left atrial enlargement  Nonspecific T wave abnormality   CV: Echo 06/19/2019 IMPRESSIONS    1. The left ventricle has hyperdynamic systolic function, with an  ejection fraction of >65%. The cavity size was normal. There is mild  concentric left ventricular hypertrophy. Left ventricular diastolic  Doppler parameters are consistent with impaired  relaxation.  2. The right ventricle has normal systolic function. The cavity was  normal. There is no increase in right ventricular wall thickness. Right  ventricular systolic pressure is mildly elevated with an estimated  pressure of 46.0 mmHg.  3. Left atrial size was mildly dilated.  4. Right atrial size was mildly dilated.  5. The mitral valve is grossly normal. Mild thickening of the mitral  valve leaflet. There is mild mitral annular  calcification present.  6. The tricuspid valve is grossly normal.  7. The aortic valve is tricuspid. Aortic valve regurgitation was not  assessed by color flow Doppler.  Past Medical History:  Diagnosis Date  . Anginal pain ([place]) 2016  . Cancer ([place])   . Coronary atherosclerosis of native coronary artery, with stent to LCX in 2006 06/04/2013  . Diabetes mellitus without complication ([place])   . GERD (gastroesophageal reflux disease)   . Heart attack ([place]) 2006  . Hyperlipidemia LDL goal < 70 06/07/2013  . Hypertension   . Obesity   . S/P  coronary artery stent placement to RCA with PROMUS DES, 06/07/13, residual disease on OM1, OM2 and rPDA 06/07/2013    Past Surgical History:  Procedure Laterality Date  . CARDIAC CATHETERIZATION N/A 08/22/2015   Procedure: Left Heart Cath and Coronary Angiography;  Surgeon: Leonie Man, MD;  Location: [place] [facility];  Service: Cardiovascular;  Laterality: N/A;  . CARDIAC CATHETERIZATION N/A 08/22/2015   Procedure: Coronary Stent Intervention;  Surgeon: Leonie Man, MD;  Location: [place] [facility];  Service: Cardiovascular;  Laterality: N/A;  . CARDIAC CATHETERIZATION N/A 08/22/2015   Procedure: Left Heart Cath and Coronary Angiography;  Surgeon: Leonie Man, MD;  Location: [facility] [facility];  Service: Cardiovascular;  Laterality: N/A;  . CORONARY STENT PLACEMENT  2006   TAXUS stent CFX in [facility]  . LEFT HEART CATH AND CORONARY ANGIOGRAPHY N/A 07/21/2018   Procedure: LEFT HEART CATH AND CORONARY ANGIOGRAPHY;  Surgeon: Troy Sine, MD;  Location: [place] [facility];  Service: Cardiovascular;  Laterality: N/A;  . LEFT HEART CATHETERIZATION WITH CORONARY ANGIOGRAM N/A 06/07/2013   Procedure: LEFT HEART CATHETERIZATION WITH CORONARY ANGIOGRAM;  Surgeon: Sanda Klein, MD;  Location: [facility];  Service: Cardiovascular;  Laterality: N/A;    MEDICATIONS: . acetaminophen (TYLENOL) 500 MG tablet  . aspirin EC 81 MG EC tablet  . atorvastatin (LIPITOR) 80 MG tablet  . bismuth subsalicylate (PEPTO BISMOL) 262 MG/15ML suspension  . Dulaglutide (TRULICITY) 1.5 QX/4.5WT SOPN  . furosemide (LASIX) 20 MG tablet  . glimepiride (AMARYL) 4 MG tablet  . Insulin Pen Needle (TRUEPLUS PEN NEEDLES) 32G X 4 MM MISC  . isosorbide mononitrate (IMDUR) 30 MG 24 hr tablet  . metFORMIN (GLUCOPHAGE) 500 MG tablet  . metoprolol tartrate (LOPRESSOR) 100 MG tablet  . nitroGLYCERIN (NITROSTAT) 0.4 MG SL tablet  . pantoprazole (PROTONIX) 40 MG tablet  . senna-docusate (SENOKOT-S) 8.6-50 MG tablet  .  traMADol (ULTRAM) 50 MG tablet   No current facility-administered medications for this encounter.     Konrad Felix, PA-C [facility] [phone]

## 2021-01-25 NOTE — Anesthesia Preprocedure Evaluation (Addendum)
Anesthesia Evaluation  Patient identified by MRN, date of birth, ID band Patient awake    Reviewed: Allergy & Precautions, H&P , NPO status , Patient's Chart, lab work & pertinent test results  Airway Mallampati: II  TM Distance: >3 FB Neck ROM: Full    Dental  (+) Upper Dentures   Pulmonary neg pulmonary ROS, former smoker,    Pulmonary exam normal breath sounds clear to auscultation       Cardiovascular hypertension, + CAD, + Past MI and + Cardiac Stents  Normal cardiovascular exam Rhythm:Regular Rate:Normal     Neuro/Psych negative neurological ROS  negative psych ROS   GI/Hepatic negative GI ROS, Neg liver ROS,   Endo/Other  diabetes  Renal/GU negative Renal ROS  negative genitourinary   Musculoskeletal negative musculoskeletal ROS (+)   Abdominal   Peds negative pediatric ROS (+)  Hematology negative hematology ROS (+)   Anesthesia Other Findings   Reproductive/Obstetrics negative OB ROS                           Anesthesia Physical Anesthesia Plan  ASA: III  Anesthesia Plan: General   Post-op Pain Management:    Induction: Intravenous  PONV Risk Score and Plan: 3 and Ondansetron, Dexamethasone, Treatment may vary due to age or medical condition and Midazolam  Airway Management Planned: Oral ETT  Additional Equipment:   Intra-op Plan:   Post-operative Plan: Extubation in OR  Informed Consent: I have reviewed the patients History and Physical, chart, labs and discussed the procedure including the risks, benefits and alternatives for the proposed anesthesia with the patient or authorized representative who has indicated his/her understanding and acceptance.     Dental advisory given  Plan Discussed with: CRNA and Surgeon  Anesthesia Plan Comments: (See PAT note 01/24/2021, Konrad Felix, PA-C)       Anesthesia Quick Evaluation

## 2021-01-26 ENCOUNTER — Telehealth: Payer: Self-pay

## 2021-01-26 LAB — URINE CULTURE

## 2021-01-26 NOTE — Telephone Encounter (Signed)
LM for [name]  to call the office if she has any questions regarding her written pre-op instructions  For surgery on 01-29-21.

## 2021-01-29 ENCOUNTER — Encounter (HOSPITAL_COMMUNITY): Payer: Self-pay | Admitting: Gynecologic Oncology

## 2021-01-29 ENCOUNTER — Ambulatory Visit (HOSPITAL_COMMUNITY): Payer: Self-pay | Admitting: Anesthesiology

## 2021-01-29 ENCOUNTER — Ambulatory Visit (HOSPITAL_COMMUNITY): Payer: Self-pay | Admitting: Physician Assistant

## 2021-01-29 ENCOUNTER — Encounter (HOSPITAL_COMMUNITY)
Admission: RE | Disposition: A | Discharge status: Home / Self Care | Payer: Self-pay | Source: Home / Self Care | Attending: Gynecologic Oncology

## 2021-01-29 ENCOUNTER — Ambulatory Visit (HOSPITAL_COMMUNITY)
Admission: RE | Admit: 2021-01-29 | Discharge: 2021-01-29 | Disposition: A | Discharge status: Home / Self Care | Payer: Self-pay | Attending: Gynecologic Oncology | Admitting: Gynecologic Oncology

## 2021-01-29 ENCOUNTER — Other Ambulatory Visit: Payer: Self-pay

## 2021-01-29 DIAGNOSIS — I110 Hypertensive heart disease with heart failure: Secondary | ICD-10-CM | POA: Insufficient documentation

## 2021-01-29 DIAGNOSIS — Z8616 Personal history of COVID-19: Secondary | ICD-10-CM | POA: Insufficient documentation

## 2021-01-29 DIAGNOSIS — I252 Old myocardial infarction: Secondary | ICD-10-CM | POA: Insufficient documentation

## 2021-01-29 DIAGNOSIS — Z955 Presence of coronary angioplasty implant and graft: Secondary | ICD-10-CM | POA: Insufficient documentation

## 2021-01-29 DIAGNOSIS — N950 Postmenopausal bleeding: Secondary | ICD-10-CM | POA: Insufficient documentation

## 2021-01-29 DIAGNOSIS — K660 Peritoneal adhesions (postprocedural) (postinfection): Secondary | ICD-10-CM | POA: Insufficient documentation

## 2021-01-29 DIAGNOSIS — Z7982 Long term (current) use of aspirin: Secondary | ICD-10-CM | POA: Insufficient documentation

## 2021-01-29 DIAGNOSIS — Z791 Long term (current) use of non-steroidal anti-inflammatories (NSAID): Secondary | ICD-10-CM | POA: Insufficient documentation

## 2021-01-29 DIAGNOSIS — E119 Type 2 diabetes mellitus without complications: Secondary | ICD-10-CM | POA: Insufficient documentation

## 2021-01-29 DIAGNOSIS — Z79899 Other long term (current) drug therapy: Secondary | ICD-10-CM | POA: Insufficient documentation

## 2021-01-29 DIAGNOSIS — C541 Malignant neoplasm of endometrium: Secondary | ICD-10-CM | POA: Insufficient documentation

## 2021-01-29 DIAGNOSIS — I5032 Chronic diastolic (congestive) heart failure: Secondary | ICD-10-CM | POA: Insufficient documentation

## 2021-01-29 DIAGNOSIS — Z794 Long term (current) use of insulin: Secondary | ICD-10-CM | POA: Insufficient documentation

## 2021-01-29 DIAGNOSIS — Z8249 Family history of ischemic heart disease and other diseases of the circulatory system: Secondary | ICD-10-CM | POA: Insufficient documentation

## 2021-01-29 DIAGNOSIS — Z800 Family history of malignant neoplasm of digestive organs: Secondary | ICD-10-CM | POA: Insufficient documentation

## 2021-01-29 DIAGNOSIS — Z7902 Long term (current) use of antithrombotics/antiplatelets: Secondary | ICD-10-CM | POA: Insufficient documentation

## 2021-01-29 DIAGNOSIS — Z833 Family history of diabetes mellitus: Secondary | ICD-10-CM | POA: Insufficient documentation

## 2021-01-29 DIAGNOSIS — I2510 Atherosclerotic heart disease of native coronary artery without angina pectoris: Secondary | ICD-10-CM | POA: Insufficient documentation

## 2021-01-29 DIAGNOSIS — C775 Secondary and unspecified malignant neoplasm of intrapelvic lymph nodes: Secondary | ICD-10-CM | POA: Insufficient documentation

## 2021-01-29 HISTORY — PX: ROBOTIC ASSISTED TOTAL HYSTERECTOMY WITH BILATERAL SALPINGO OOPHERECTOMY: SHX6086

## 2021-01-29 HISTORY — PX: LYMPH NODE DISSECTION: SHX5087

## 2021-01-29 HISTORY — PX: SENTINEL NODE BIOPSY: SHX6608

## 2021-01-29 LAB — GLUCOSE, CAPILLARY
Glucose-Capillary: 140 mg/dL — ABNORMAL HIGH (ref 70–99)
Glucose-Capillary: 173 mg/dL — ABNORMAL HIGH (ref 70–99)

## 2021-01-29 LAB — TYPE AND SCREEN
ABO/RH(D): O POS
Antibody Screen: NEGATIVE

## 2021-01-29 SURGERY — HYSTERECTOMY, TOTAL, ROBOT-ASSISTED, LAPAROSCOPIC, WITH BILATERAL SALPINGO-OOPHORECTOMY
Anesthesia: General | Site: Abdomen

## 2021-01-29 MED ORDER — LIDOCAINE HCL (CARDIAC) PF 100 MG/5ML IV SOSY
PREFILLED_SYRINGE | INTRAVENOUS | Status: DC | PRN
  Administered 2021-01-29: 60 mg via INTRAVENOUS

## 2021-01-29 MED ORDER — MIDAZOLAM HCL 2 MG/2ML IJ SOLN
INTRAMUSCULAR | Status: AC
  Filled 2021-01-29: qty 2

## 2021-01-29 MED ORDER — FENTANYL CITRATE (PF) 250 MCG/5ML IJ SOLN
INTRAMUSCULAR | Status: AC
  Filled 2021-01-29: qty 5

## 2021-01-29 MED ORDER — KETOROLAC TROMETHAMINE 30 MG/ML IJ SOLN: 30.0000 mg | Freq: Once | INTRAMUSCULAR | Status: DC | PRN

## 2021-01-29 MED ORDER — GLYCOPYRROLATE PF 0.2 MG/ML IJ SOSY
PREFILLED_SYRINGE | INTRAMUSCULAR | Status: AC
  Filled 2021-01-29: qty 1

## 2021-01-29 MED ORDER — LACTATED RINGERS IR SOLN
Status: DC | PRN
  Administered 2021-01-29: 1000 mL

## 2021-01-29 MED ORDER — ONDANSETRON HCL 4 MG/2ML IJ SOLN: 4.0000 mg | Freq: Once | INTRAMUSCULAR | Status: DC | PRN

## 2021-01-29 MED ORDER — LIDOCAINE HCL 2 % IJ SOLN
INTRAMUSCULAR | Status: AC
  Filled 2021-01-29: qty 20

## 2021-01-29 MED ORDER — STERILE WATER FOR IRRIGATION IR SOLN
Status: DC | PRN
  Administered 2021-01-29: 1000 mL

## 2021-01-29 MED ORDER — HYDRALAZINE HCL 20 MG/ML IJ SOLN: 10.0000 mg | Freq: Once | INTRAMUSCULAR | Status: AC

## 2021-01-29 MED ORDER — ACETAMINOPHEN 500 MG PO TABS
1000.0000 mg | ORAL_TABLET | ORAL | Status: AC
  Administered 2021-01-29: 1000 mg via ORAL
  Filled 2021-01-29: qty 2

## 2021-01-29 MED ORDER — MIDAZOLAM HCL 5 MG/5ML IJ SOLN
INTRAMUSCULAR | Status: DC | PRN
  Administered 2021-01-29 (×2): 1 mg via INTRAVENOUS

## 2021-01-29 MED ORDER — ONDANSETRON HCL 4 MG/2ML IJ SOLN: 4.0000 mg | Freq: Four times a day (QID) | INTRAMUSCULAR | Status: DC | PRN

## 2021-01-29 MED ORDER — STERILE WATER FOR INJECTION IJ SOLN
INTRAMUSCULAR | Status: DC | PRN
  Administered 2021-01-29: 10 mL

## 2021-01-29 MED ORDER — ONDANSETRON HCL 4 MG/2ML IJ SOLN
INTRAMUSCULAR | Status: DC | PRN
  Administered 2021-01-29: 4 mg via INTRAVENOUS

## 2021-01-29 MED ORDER — ONDANSETRON HCL 4 MG PO TABS
4.0000 mg | ORAL_TABLET | Freq: Four times a day (QID) | ORAL | Status: DC | PRN
Start: 2021-01-29 — End: 2021-01-30

## 2021-01-29 MED ORDER — STERILE WATER FOR INJECTION IJ SOLN
INTRAMUSCULAR | Status: AC
  Filled 2021-01-29: qty 10

## 2021-01-29 MED ORDER — KETOROLAC TROMETHAMINE 30 MG/ML IJ SOLN
INTRAMUSCULAR | Status: AC
  Administered 2021-01-29: 30 mg via INTRAVENOUS
  Filled 2021-01-29: qty 1

## 2021-01-29 MED ORDER — TRAMADOL HCL 50 MG PO TABS: 50.0000 mg | ORAL_TABLET | Freq: Four times a day (QID) | ORAL | Status: DC | PRN

## 2021-01-29 MED ORDER — EPHEDRINE 5 MG/ML INJ
INTRAVENOUS | Status: AC
  Filled 2021-01-29: qty 10

## 2021-01-29 MED ORDER — HEPARIN SODIUM (PORCINE) 5000 UNIT/ML IJ SOLN
5000.0000 [IU] | INTRAMUSCULAR | Status: AC
  Administered 2021-01-29: 5000 [IU] via SUBCUTANEOUS
  Filled 2021-01-29: qty 1

## 2021-01-29 MED ORDER — LIDOCAINE HCL (PF) 2 % IJ SOLN
INTRAMUSCULAR | Status: AC
  Filled 2021-01-29: qty 5

## 2021-01-29 MED ORDER — GLYCOPYRROLATE 0.2 MG/ML IJ SOLN
INTRAMUSCULAR | Status: DC | PRN
  Administered 2021-01-29: .2 mg via INTRAVENOUS

## 2021-01-29 MED ORDER — FENTANYL CITRATE (PF) 100 MCG/2ML IJ SOLN
INTRAMUSCULAR | Status: DC | PRN
  Administered 2021-01-29: 50 ug via INTRAVENOUS
  Administered 2021-01-29: 25 ug via INTRAVENOUS
  Administered 2021-01-29: 50 ug via INTRAVENOUS
  Administered 2021-01-29: 25 ug via INTRAVENOUS
  Administered 2021-01-29 (×2): 50 ug via INTRAVENOUS

## 2021-01-29 MED ORDER — OXYCODONE HCL 5 MG PO TABS
5.0000 mg | ORAL_TABLET | ORAL | Status: DC | PRN
  Administered 2021-01-29: 5 mg via ORAL

## 2021-01-29 MED ORDER — SCOPOLAMINE 1 MG/3DAYS TD PT72
1.0000 | MEDICATED_PATCH | TRANSDERMAL | Status: DC
  Administered 2021-01-29: 1.5 mg via TRANSDERMAL
  Filled 2021-01-29: qty 1

## 2021-01-29 MED ORDER — HYDROMORPHONE HCL 1 MG/ML IJ SOLN
0.2000 mg | INTRAMUSCULAR | Status: DC | PRN
  Administered 2021-01-29: 0.5 mg via INTRAVENOUS

## 2021-01-29 MED ORDER — ONDANSETRON HCL 4 MG/2ML IJ SOLN
INTRAMUSCULAR | Status: AC
  Filled 2021-01-29: qty 2

## 2021-01-29 MED ORDER — HYDROMORPHONE HCL 1 MG/ML IJ SOLN: 0.2500 mg | INTRAMUSCULAR | Status: DC | PRN

## 2021-01-29 MED ORDER — OXYCODONE HCL 5 MG PO TABS
ORAL_TABLET | ORAL | Status: AC
  Filled 2021-01-29: qty 1

## 2021-01-29 MED ORDER — ROCURONIUM BROMIDE 100 MG/10ML IV SOLN
INTRAVENOUS | Status: DC | PRN
  Administered 2021-01-29: 10 mg via INTRAVENOUS
  Administered 2021-01-29: 20 mg via INTRAVENOUS
  Administered 2021-01-29: 70 mg via INTRAVENOUS
  Administered 2021-01-29: 10 mg via INTRAVENOUS

## 2021-01-29 MED ORDER — CHLORHEXIDINE GLUCONATE 0.12 % MT SOLN
15.0000 mL | Freq: Once | OROMUCOSAL | Status: AC
  Administered 2021-01-29: 15 mL via OROMUCOSAL

## 2021-01-29 MED ORDER — HYDRALAZINE HCL 20 MG/ML IJ SOLN
INTRAMUSCULAR | Status: AC
  Administered 2021-01-29: 10 mg via INTRAVENOUS
  Filled 2021-01-29: qty 1

## 2021-01-29 MED ORDER — PROPOFOL 10 MG/ML IV BOLUS
INTRAVENOUS | Status: AC
  Filled 2021-01-29: qty 20

## 2021-01-29 MED ORDER — ROCURONIUM BROMIDE 10 MG/ML (PF) SYRINGE
PREFILLED_SYRINGE | INTRAVENOUS | Status: AC
  Filled 2021-01-29: qty 10

## 2021-01-29 MED ORDER — LIDOCAINE HCL (PF) 2 % IJ SOLN
INTRAMUSCULAR | Status: DC | PRN
  Administered 2021-01-29: 1.5 mg/kg/h via INTRADERMAL

## 2021-01-29 MED ORDER — BUPIVACAINE HCL 0.25 % IJ SOLN
INTRAMUSCULAR | Status: AC
  Filled 2021-01-29: qty 1

## 2021-01-29 MED ORDER — HYDROMORPHONE HCL 1 MG/ML IJ SOLN
INTRAMUSCULAR | Status: AC
  Filled 2021-01-29: qty 1

## 2021-01-29 MED ORDER — ALBUMIN HUMAN 5 % IV SOLN: INTRAVENOUS | Status: DC | PRN

## 2021-01-29 MED ORDER — BUPIVACAINE HCL 0.25 % IJ SOLN
INTRAMUSCULAR | Status: DC | PRN
  Administered 2021-01-29: 27 mL

## 2021-01-29 MED ORDER — DEXAMETHASONE SODIUM PHOSPHATE 10 MG/ML IJ SOLN
INTRAMUSCULAR | Status: DC | PRN
  Administered 2021-01-29: 5 mg via INTRAVENOUS

## 2021-01-29 MED ORDER — EPHEDRINE SULFATE-NACL 50-0.9 MG/10ML-% IV SOSY
PREFILLED_SYRINGE | INTRAVENOUS | Status: DC | PRN
  Administered 2021-01-29: 5 mg via INTRAVENOUS

## 2021-01-29 MED ORDER — STERILE WATER FOR INJECTION IJ SOLN
INTRAMUSCULAR | Status: DC | PRN
  Administered 2021-01-29: 20 mL via INTRAMUSCULAR

## 2021-01-29 MED ORDER — CEFAZOLIN SODIUM-DEXTROSE 2-4 GM/100ML-% IV SOLN
2.0000 g | INTRAVENOUS | Status: AC
  Administered 2021-01-29: 2 g via INTRAVENOUS
  Filled 2021-01-29: qty 100

## 2021-01-29 MED ORDER — SUGAMMADEX SODIUM 200 MG/2ML IV SOLN
INTRAVENOUS | Status: DC | PRN
  Administered 2021-01-29: 200 mg via INTRAVENOUS

## 2021-01-29 MED ORDER — PROPOFOL 10 MG/ML IV BOLUS
INTRAVENOUS | Status: DC | PRN
  Administered 2021-01-29: 150 mg via INTRAVENOUS

## 2021-01-29 MED ORDER — GABAPENTIN 300 MG PO CAPS
300.0000 mg | ORAL_CAPSULE | ORAL | Status: AC
  Administered 2021-01-29: 300 mg via ORAL
  Filled 2021-01-29: qty 1

## 2021-01-29 MED ORDER — DEXAMETHASONE SODIUM PHOSPHATE 10 MG/ML IJ SOLN
INTRAMUSCULAR | Status: AC
  Filled 2021-01-29: qty 1

## 2021-01-29 MED ORDER — ORAL CARE MOUTH RINSE: 15.0000 mL | Freq: Once | OROMUCOSAL | Status: AC

## 2021-01-29 MED ORDER — LACTATED RINGERS IV SOLN: INTRAVENOUS | Status: DC

## 2021-01-29 SURGICAL SUPPLY — 70 items
ADH SKN CLS APL DERMABOND .7 (GAUZE/BANDAGES/DRESSINGS) ×2
AGENT HMST KT MTR STRL THRMB (HEMOSTASIS)
APL ESCP 34 STRL LF DISP (HEMOSTASIS)
APPLICATOR SURGIFLO ENDO (HEMOSTASIS) IMPLANT
BACTOSHIELD CHG 4% 4OZ (MISCELLANEOUS) ×1
BAG LAPAROSCOPIC 12 15 PORT 16 (BASKET) IMPLANT
BAG RETRIEVAL 12/15 (BASKET)
BAG SPEC RTRVL LRG 6X4 10 (ENDOMECHANICALS)
BLADE SURG SZ10 CARB STEEL (BLADE) IMPLANT
COVER BACK TABLE 60X90IN (DRAPES) ×3 IMPLANT
COVER TIP SHEARS 8 DVNC (MISCELLANEOUS) ×2 IMPLANT
COVER TIP SHEARS 8MM DA VINCI (MISCELLANEOUS) ×3
COVER WAND RF STERILE (DRAPES) IMPLANT
DECANTER SPIKE VIAL GLASS SM (MISCELLANEOUS) IMPLANT
DERMABOND ADVANCED (GAUZE/BANDAGES/DRESSINGS) ×1
DERMABOND ADVANCED .7 DNX12 (GAUZE/BANDAGES/DRESSINGS) ×2 IMPLANT
DRAPE ARM DVNC X/XI (DISPOSABLE) ×8 IMPLANT
DRAPE COLUMN DVNC XI (DISPOSABLE) ×2 IMPLANT
DRAPE DA VINCI XI ARM (DISPOSABLE) ×12
DRAPE DA VINCI XI COLUMN (DISPOSABLE) ×3
DRAPE SHEET LG 3/4 BI-LAMINATE (DRAPES) ×3 IMPLANT
DRAPE SURG IRRIG POUCH 19X23 (DRAPES) ×3 IMPLANT
DRSG OPSITE POSTOP 4X6 (GAUZE/BANDAGES/DRESSINGS) IMPLANT
DRSG OPSITE POSTOP 4X8 (GAUZE/BANDAGES/DRESSINGS) IMPLANT
ELECT PENCIL ROCKER SW 15FT (MISCELLANEOUS) IMPLANT
ELECT REM PT RETURN 15FT ADLT (MISCELLANEOUS) ×3 IMPLANT
GLOVE SURG ENC MOIS LTX SZ6 (GLOVE) ×12 IMPLANT
GLOVE SURG ENC MOIS LTX SZ6.5 (GLOVE) ×6 IMPLANT
GOWN STRL REUS W/ TWL LRG LVL3 (GOWN DISPOSABLE) ×8 IMPLANT
GOWN STRL REUS W/TWL LRG LVL3 (GOWN DISPOSABLE) ×12
HOLDER FOLEY CATH W/STRAP (MISCELLANEOUS) ×3 IMPLANT
IRRIG SUCT STRYKERFLOW 2 WTIP (MISCELLANEOUS) ×3
IRRIGATION SUCT STRKRFLW 2 WTP (MISCELLANEOUS) ×2 IMPLANT
KIT PROCEDURE DA VINCI SI (MISCELLANEOUS)
KIT PROCEDURE DVNC SI (MISCELLANEOUS) IMPLANT
KIT TURNOVER KIT A (KITS) ×3 IMPLANT
MANIPULATOR UTERINE 4.5 ZUMI (MISCELLANEOUS) ×3 IMPLANT
NEEDLE HYPO 21X1.5 SAFETY (NEEDLE) ×3 IMPLANT
NEEDLE SPNL 18GX3.5 QUINCKE PK (NEEDLE) IMPLANT
OBTURATOR OPTICAL STANDARD 8MM (TROCAR) ×3
OBTURATOR OPTICAL STND 8 DVNC (TROCAR) ×2
OBTURATOR OPTICALSTD 8 DVNC (TROCAR) ×2 IMPLANT
PACK ROBOT GYN CUSTOM WL (TRAY / TRAY PROCEDURE) ×3 IMPLANT
PAD POSITIONING PINK XL (MISCELLANEOUS) ×3 IMPLANT
PORT ACCESS TROCAR AIRSEAL 12 (TROCAR) ×2 IMPLANT
PORT ACCESS TROCAR AIRSEAL 5M (TROCAR) ×1
POUCH SPECIMEN RETRIEVAL 10MM (ENDOMECHANICALS) IMPLANT
SCRUB CHG 4% DYNA-HEX 4OZ (MISCELLANEOUS) ×2 IMPLANT
SEAL CANN UNIV 5-8 DVNC XI (MISCELLANEOUS) ×8 IMPLANT
SEAL XI 5MM-8MM UNIVERSAL (MISCELLANEOUS) ×12
SEALER VESSEL DA VINCI XI (MISCELLANEOUS) ×3
SEALER VESSEL EXT DVNC XI (MISCELLANEOUS) ×2 IMPLANT
SET TRI-LUMEN FLTR TB AIRSEAL (TUBING) ×3 IMPLANT
SPONGE LAP 18X18 RF (DISPOSABLE) IMPLANT
SURGIFLO W/THROMBIN 8M KIT (HEMOSTASIS) IMPLANT
SUT MNCRL AB 4-0 PS2 18 (SUTURE) IMPLANT
SUT PDS AB 1 TP1 96 (SUTURE) IMPLANT
SUT VIC AB 0 CT1 27 (SUTURE)
SUT VIC AB 0 CT1 27XBRD ANTBC (SUTURE) IMPLANT
SUT VIC AB 2-0 CT1 27 (SUTURE)
SUT VIC AB 2-0 CT1 TAPERPNT 27 (SUTURE) IMPLANT
SUT VIC AB 4-0 PS2 18 (SUTURE) ×6 IMPLANT
SYR 10ML LL (SYRINGE) ×3 IMPLANT
TOWEL OR NON WOVEN STRL DISP B (DISPOSABLE) ×3 IMPLANT
TRAP SPECIMEN MUCUS 40CC (MISCELLANEOUS) IMPLANT
TRAY FOLEY MTR SLVR 16FR STAT (SET/KITS/TRAYS/PACK) ×3 IMPLANT
TROCAR XCEL NON-BLD 5MMX100MML (ENDOMECHANICALS) IMPLANT
UNDERPAD 30X36 HEAVY ABSORB (UNDERPADS AND DIAPERS) ×3 IMPLANT
WATER STERILE IRR 1000ML POUR (IV SOLUTION) ×3 IMPLANT
YANKAUER SUCT BULB TIP NO VENT (SUCTIONS) IMPLANT

## 2021-01-29 NOTE — Transfer of Care (Signed)
Immediate Anesthesia Transfer of Care Note  Patient: [name]  Procedure(s) Performed: XI ROBOTIC ASSISTED TOTAL HYSTERECTOMY WITH BILATERAL SALPINGO OOPHORECTOMY, (Bilateral Abdomen) SENTINEL NODE BIOPSY (N/A Abdomen) SELECTIVE LYMPH NODE DISSECTION (N/A Abdomen)  Patient Location: PACU  Anesthesia Type:General  Level of Consciousness: sedated, patient cooperative and responds to stimulation  Airway & Oxygen Therapy: Patient Spontanous Breathing and Patient connected to face mask oxygen  Post-op Assessment: Report given to RN, Post -op Vital signs reviewed and stable and RN to page MDA for HTN  Post vital signs: Reviewed and stable  Last Vitals:  Vitals Value Taken Time  BP 211/106 01/29/21 1445  Temp    Pulse 69 01/29/21 1446  Resp 12 01/29/21 1446  SpO2 96 % 01/29/21 1446  Vitals shown include unvalidated device data.  Last Pain:  Vitals:   01/29/21 0900  TempSrc:   PainSc: 0-No pain         Complications: No complications documented.

## 2021-01-29 NOTE — Discharge Instructions (Signed)
01/29/2021  Activity: 1. Be up and out of the bed during the day.  Take a nap if needed.  You may walk up steps but be careful and use the hand rail.  Stair climbing will tire you more than you think, you may need to stop part way and rest.   2. No lifting or straining for 6 weeks.  3. No driving for until you are off of narcotics and can brake safely. For most people, this is 5-10 days.  4. Shower daily.  Use soap and water on your incision and pat dry; don't rub.   5. No sexual activity and nothing in the vagina for 8 weeks.  Medications:  - Take ibuprofen and tylenol first line for pain control. Take these regularly (every 6 hours) to decrease the build up of pain.  - If necessary, for severe pain not relieved by tylenol, take tramadol.  - While taking tramadol you should take sennakot every night to reduce the likelihood of constipation. If this causes diarrhea, stop its use.  Diet: 1. Low sodium Heart Healthy Diet is recommended.  2. It is safe to use a laxative if you have difficulty moving your bowels.   Wound Care: 1. Keep clean and dry.  Shower daily.  Reasons to call the Doctor:   Fever - Oral temperature greater than 100.4 degrees Fahrenheit  Foul-smelling vaginal discharge  Difficulty urinating  Nausea and vomiting  Increased pain at the site of the incision that is unrelieved with pain medicine.  Difficulty breathing with or without chest pain  New calf pain especially if only on one side  Sudden, continuing increased vaginal bleeding with or without clots.   Follow-up: 1. Phone call visit in around 1 week. See Jeral Pinch in 3 weeks in the office.  Contacts: For questions or concerns you should contact:  Dr. Jeral Pinch at [phone]  After hours and on week-ends call [phone] and ask to speak to the physician on call for Gynecologic Oncology

## 2021-01-29 NOTE — Anesthesia Procedure Notes (Signed)

## 2021-01-29 NOTE — Interval H&P Note (Signed)
History and Physical Interval Note:  01/29/2021 9:13 AM  [name]  has presented today for surgery, with the diagnosis of ENDOMETRIAL CANCER.  The various methods of treatment have been discussed with the patient and family. After consideration of risks, benefits and other options for treatment, the patient has consented to  Procedure(s): XI ROBOTIC ASSISTED TOTAL HYSTERECTOMY WITH BILATERAL SALPINGO OOPHORECTOMY, POSSIBLE LAPAROTOMY (Bilateral) SENTINEL NODE BIOPSY (N/A) POSSIBLE LYMPH NODE DISSECTION (N/A) as a surgical intervention.  The patient's history has been reviewed, patient examined, no change in status, stable for surgery.  I have reviewed the patient's chart and labs.  Questions were answered to the patient's satisfaction.     Lafonda Mosses

## 2021-01-29 NOTE — Anesthesia Postprocedure Evaluation (Signed)
Anesthesia Post Note  Patient: [name]  Procedure(s) Performed: XI ROBOTIC ASSISTED TOTAL HYSTERECTOMY WITH BILATERAL SALPINGO OOPHORECTOMY, (Bilateral Abdomen) SENTINEL NODE BIOPSY (N/A Abdomen) SELECTIVE LYMPH NODE DISSECTION (N/A Abdomen)     Patient location during evaluation: PACU Anesthesia Type: General Level of consciousness: awake and alert Pain management: pain level controlled Vital Signs Assessment: post-procedure vital signs reviewed and stable Respiratory status: spontaneous breathing, nonlabored ventilation, respiratory function stable and patient connected to nasal cannula oxygen Cardiovascular status: blood pressure returned to baseline and stable Postop Assessment: no apparent nausea or vomiting Anesthetic complications: no   No complications documented.  Last Vitals:  Vitals:   01/29/21 1515 01/29/21 1530  BP: (!) 186/166 (!) 175/85  Pulse: 78 72  Resp: 16 13  Temp:    SpO2: (!) 89% 90%    Last Pain:  Vitals:   01/29/21 1530  TempSrc:   PainSc: 5                  Odelle Kosier S

## 2021-01-29 NOTE — Op Note (Signed)
OPERATIVE NOTE  Pre-operative Diagnosis: endometrial cancer, high grade favor serous  Post-operative Diagnosis: same  Operation: Robotic-assisted laparoscopic total hysterectomy with bilateral salpingoophorectomy, SLN biopsy on the left, selective pelvic LND (external and common iliac on the right), omentectomy   Surgeon: Jeral Pinch MD  Assistant Surgeon: Joylene John NP  Anesthesia: GET  Urine Output: 100cc  Operative Findings: On EUA, 10-12cm enlarged mobile uterus. On intra-abdomina entry, minimal adhesions between the liver and anterior abdomen on the right. Some changes c/w fatty liver on the left. Omentum, stomach, small and large bowel all grossly normal. Bilateral ovaries normal appearing. Uterus with 5-6cm posterior fibroid, otherwise normal appearing. No mapping to right pelvis. On left, mapping to the level of the superior vessel artery, enlarged lymph node noted (likely sentinel) within the upper aspect of the obturator space. In bilateral pelvic basins, multiple enlarged lymph nodes. On the right, enlarged lymph nodes extended superiorly along the common iliac vessels. At the end of surgery, no obvious abdominal or pelvic evidence of disease.  Estimated Blood Loss:  100cc      Total IV Fluids: see I&O flowsheet         Specimens: uterus, cervix, bilateral tubes and ovaries, left obturator sentinel lymph node, left enlarged pelvic lymph nodes, right enlarged pelvic lymph nodes (external iliac and common), omentum         Complications:  None apparent; patient tolerated the procedure well.         Disposition: PACU - hemodynamically stable.  Procedure Details  The patient was seen in the Holding Room. The risks, benefits, complications, treatment options, and expected outcomes were discussed with the patient.  The patient concurred with the proposed plan, giving informed consent.  The site of surgery properly noted/marked. The patient was identified as [name]  and the procedure verified as a Robotic-assisted hysterectomy with bilateral salpingo oophorectomy with SLN biopsy.   After induction of anesthesia, the patient was draped and prepped in the usual sterile manner. Patient was placed in supine position after anesthesia and draped and prepped in the usual sterile manner as follows: Her arms were tucked to her side with all appropriate precautions.  The shoulders were stabilized with padded shoulder blocks applied to the acromium processes.  The patient was placed in the semi-lithotomy position in [place].  The perineum and vagina were prepped with CholoraPrep. The patient was draped after the CholoraPrep had been allowed to dry for 3 minutes.  A Time Out was held and the above information confirmed.  The urethra was prepped with Betadine. Foley catheter was placed.  A sterile speculum was placed in the vagina.  The cervix was grasped with a single-tooth tenaculum. 2mg  total of ICG was injected into the cervical stroma at 2 and 9 o'clock with 1cc injected at a 1cm and 23mm depth (concentration 0.5mg /ml) in all locations. The cervix was dilated with Kennon Rounds dilators.  The ZUMI uterine manipulator with a medium colpotomizer ring was placed without difficulty.  A pneum occluder balloon was placed over the manipulator.  OG tube placement was confirmed and to suction.   Next, a 10 mm skin incision was made 1 cm below the subcostal margin in the midclavicular line.  The 5 mm Optiview port and scope was used for direct entry.  Opening pressure was under 10 mm CO2.  The abdomen was insufflated and the findings were noted as above.   At this point and all points during the procedure, the patient's intra-abdominal pressure did not  exceed 15 mmHg. Next, an 8 mm skin incision was made superior to the umbilicus and a right and left port were placed about 8 cm lateral to the robot port on the right and left side.  A fourth arm was placed on the right.  The 5 mm assist  trocar was exchanged for a 10-12 mm port. All ports were placed under direct visualization.  The patient was placed in steep Trendelenburg.  Bowel was folded away into the upper abdomen.  The robot was docked in the normal manner.  The right and left peritoneum were opened parallel to the IP ligament to open the retroperitoneal spaces bilaterally. The round ligaments were transected. The SLN mapping was performed in bilateral pelvic basins. After identifying the ureters, the para rectal and paravesical spaces were opened up entirely with careful dissection below the level of the ureters bilaterally and to the depth of the uterine artery origin in order to skeletonize the uterine "web" and ensure visualization of all parametrial channels. The para-aortic basins were carefully exposed and evaluated for isolated para-aortic SLN's. Lymphatic channels were identified travelling to the following visualized sentinel lymph node's: left obturator. These SLN's were separated from their surrounding lymphatic tissue, removed and sent for permanent pathology.  Given bilateral enlarged pelvic and low para-aortic lymph nodes, a selective lymph node dissection was performed. A pelvic lymph dissection was performed on the right with the following borders: proximally the bifurcation of the common iliac, distally the circumflex iliac vein, laterally the genitofemoral nerve, the medial border was the superior vesicle artery and the deep border was the obturator nerve. All lymphatic tissue was removed and sent to Pathology. A similar procedure was performed on the contralateral side. On the right, enlarged lymph nodes were noted extending along the common iliac. With care to visualize the ureter, these were removed.  The hysterectomy was started.  The ureter was again noted to be on the medial leaf of the broad ligament.  The peritoneum above the ureter was incised and stretched and the infundibulopelvic ligament was skeletonized,  cauterized and cut.  The posterior peritoneum was taken down to the level of the KOH ring.  The anterior peritoneum was also taken down.  The bladder flap was created to the level of the KOH ring.  The uterine artery on the right side was skeletonized, cauterized and cut in the normal manner.  A similar procedure was performed on the left.  The colpotomy was made and the uterus, cervix, bilateral ovaries and tubes were amputated and delivered through the vagina.  Pedicles were inspected and excellent hemostasis was achieved.    An omentectomy was then performed using the vessel sealer after the transverse colon was identified. The omentum was then delivered through the vagina.  The colpotomy at the vaginal cuff was closed with Vicryl on a CT1 needle in running manner.  Irrigation was used and excellent hemostasis was achieved. Floseal was placed along the left pelvic sidewall given some oozing noted from the bed of the obturator space.  At this point in the procedure was completed.  Robotic instruments were removed under direct visulaization.  The robot was undocked. The fascia at the 10-12 mm port was closed with 0 Vicryl on a UR-5 needle.  The subcuticular tissue was closed with 4-0 Vicryl and the skin was closed with 4-0 Monocryl in a subcuticular manner.  Dermabond was applied.    The vagina was swabbed with  minimal bleeding noted.   All sponge, lap and  needle counts were correct x  3. Foley catheter was removed.  The patient was transferred to the recovery room in stable condition.  Jeral Pinch, MD

## 2021-01-30 ENCOUNTER — Encounter (HOSPITAL_COMMUNITY): Payer: Self-pay | Admitting: Gynecologic Oncology

## 2021-01-30 ENCOUNTER — Telehealth: Payer: Self-pay

## 2021-01-30 NOTE — Telephone Encounter (Signed)
Daughter [name] states that [name] is eating,drinking, and urinating well. She is experiencing some burning with urination. Told daughter that this is normal after the foley catheter was in for surgery. The symptoms should resolve in the nest 24 hours. If symptoms continue she is to call the office as she may need to come in to the office to have a urine specimen drawn to check for a UTI. She is not passing gas. She is burping some.  She is to begin the senokot-s 2 tabs bid now until good BM. She is to add Miralax 1 capful bid if no good BM by Friday 02-02-21. If her mother develops severe N/V with abdominal pain, she needs to call the office. Afebrile. Incisions D&I Daughter [name] aware of post op appointments as well as the office number [phone] and after hours number [phone] to call if she has any questions or concerns

## 2021-01-31 LAB — CYTOLOGY - NON PAP

## 2021-02-02 ENCOUNTER — Encounter: Payer: Self-pay | Admitting: Gynecologic Oncology

## 2021-02-02 ENCOUNTER — Encounter: Payer: Self-pay | Admitting: Oncology

## 2021-02-02 ENCOUNTER — Inpatient Hospital Stay (HOSPITAL_BASED_OUTPATIENT_CLINIC_OR_DEPARTMENT_OTHER): Payer: Self-pay | Admitting: Gynecologic Oncology

## 2021-02-02 ENCOUNTER — Telehealth: Payer: Self-pay | Admitting: *Deleted

## 2021-02-02 DIAGNOSIS — C541 Malignant neoplasm of endometrium: Secondary | ICD-10-CM

## 2021-02-02 NOTE — Telephone Encounter (Signed)
Per Lenna Sciara APP called pathology and order MSI

## 2021-02-02 NOTE — Progress Notes (Signed)
Gynecologic Oncology Telehealth Consult Note: Gyn-Onc  I connected with [name] on 02/02/21 at 10:30 AM EST by telephone and verified that I am speaking with the correct person using two identifiers.  I discussed the limitations, risks, security and privacy concerns of performing an evaluation and management service by telemedicine and the availability of in-person appointments. I also discussed with the patient that there may be a patient responsible charge related to this service. The patient expressed understanding and agreed to proceed.  Other persons participating in the visit and their role in the encounter: [name], patient's daughter.  Patient's location: home  Reason for Visit: follow-up after surgery, discussion of pathology and treatment planning  Treatment History: Oncology History Overview Note  Pap 12/14/20: adenocarcinoma, HPV negative  Her-2 equivocal by IHC, FISH pending   Endometrial carcinoma ([place])   Initial Diagnosis   Endometrial carcinoma ([place])   01/12/2021 Initial Biopsy   A. ENDOCERVIX, CURETTAGE:  - Adenocarcinoma.  B. ENDOMETRIUM, BIOPSY:  - Adenocarcinoma.  COMMENT:  The differential diagnosis includes high grade serous carcinoma.  Both  specimens have a similar morphology; high grade serous carcinoma more  commonly arises in the endometrium.  Results reported to Dr. Hale Bogus  on 01/15/2021.  Dr. Saralyn Pilar reviewed the case.    01/16/2021 Tumor Marker   Patient's tumor was tested for the following markers: CA-125 Results of the tumor marker test revealed normal value, 10.7   01/25/2021 Imaging   CT C/A/P: 1. Small volume fluid in the endometrial canal with 7 mm hypoattenuating lesion in the myometrium of the anterior fundus. 2. No definite evidence for metastatic disease in the chest, abdomen, or pelvis. Small lymph nodes are noted along both pelvic sidewalls. Attention on follow-up recommended. 3. 5 mm ground-glass opacity peripheral left  upper lobe. This is likely related to infection/inflammation and potentially scar. Attention on surveillance imaging recommended. 4. 5.8 cm lesion posterior lower uterine segment likely a fibroid. Ultrasound exam from 01/28/2010 demonstrated a 5.2 cm lesion in the left aspect of the posterior lower uterine body. 5. Aortic Atherosclerosis (ICD10-I70.0).   01/29/2021 Surgery   TRH/BSO, SLN biopsy left, selective pelvic LND right, omentectomy  Findings: On EUA, 10-12cm enlarged mobile uterus. On intra-abdomina entry, minimal adhesions between the liver and anterior abdomen on the right. Some changes c/w fatty liver on the left. Omentum, stomach, small and large bowel all grossly normal. Bilateral ovaries normal appearing. Uterus with 5-6cm posterior fibroid, otherwise normal appearing. No mapping to right pelvis. On left, mapping to the level of the superior vessel artery, enlarged lymph node noted (likely sentinel) within the upper aspect of the obturator space. In bilateral pelvic basins, multiple enlarged lymph nodes. On the right, enlarged lymph nodes extended superiorly along the common iliac vessels. At the end of surgery, no obvious abdominal or pelvic evidence of disease.   01/29/2021 Pathology Results   A. SENTINEL LYMPH NODE, LEFT INTERNAL ILIAC, BIOPSY:  - Metastatic carcinoma in (1) of (1) lymph node.   B. SENTINEL LYMPH NODE, LEFT OBTURATOR, BIOPSY:  - Metastatic carcinoma in (1) of (1) lymph node.   C. LYMPH NODES, LEFT PELVIC, DISSECTION:  - Metastatic carcinoma in (1) of (3) lymph nodes.   D. LYMPH NODE, RIGHT EXTERNAL AND COMMON ILIAC, BIOPSY:  - Metastatic carcinoma in (1) of (1) lymph node.   E. UTERUS, CERVIX AND BILATERAL FALLOPIAN TUBES AND OVARIES, TOTAL  HYSTERECTOMY AND BILATERAL SALPINGO-OOPHORECTOMY:  - High grade serous carcinoma of endometrium, with invasion more than  half of the myometrium.  - Tumor invades the stromal connective tissue of the cervix.  - No  involvement of uterine serosa or adnexa.  - Lymphovascular invasion is identified.  - See oncology table.   F. OMENTUM, OMENTECTOMY:  - Omentum, negative for carcinoma.   G. LYMPH NODES, RIGHT PELVIC, DISSECTION:  - Two lymph nodes, negative for carcinoma (0/2).   ONCOLOGY TABLE:   UTERUS, CARCINOMA OR CARCINOSARCOMA: Resection   Procedure: Total hysterectomy and bilateral salpingo-oophorectomy,  Omentectomy, Lymph node sampling  Histologic Type: Serous carcinoma  Histologic Grade: High grade  Myometrial Invasion: > 50%  Uterine Serosa Involvement: Not identified  Cervical Stroma Involvement: Present  Other Tissue/Organ Involvement: Not identified  Peritoneal/Ascitic Fluid: Negative for carcinoma  Lymphovascular Invasion: Present  Regional Lymph Nodes:       Pelvic Lymph Nodes Examined:            2 Sentinel            6 Non-Sentinel            8 Total       Pelvic Lymph Nodes with Metastasis: 4            Macrometastasis: 3            Micrometastasis: 1            Isolated Tumor Cells: 0            Laterality of Lymph Nodes with Tumor: Right (non-sentinel),  Left (sentinel and non-sentinel)            Extracapsular Extension: Present       Para-Aortic Lymph Nodes Examined: Not applicable        Para-Aortic Lymph Nodes with Metastasis: Not applicable  Distant Metastasis:       Distant Site(s) Involved: Omentum: Not involved  Pathologic Stage Classification (pTNM, AJCC 8th Edition): pT2, pN1a  Ancillary Studies: HER2 will be ordered  Additional Findings: Leiomyomata.  Adenomyosis.  Representative Tumor Block: B1  Comment: Dr. Saralyn Pilar reviewed select slides.  (v4.2.0.1)    01/29/2021 Cancer Staging   Staging form: Corpus Uteri - Carcinoma and Carcinosarcoma, AJCC 8th Edition - Clinical stage from 01/29/2021: FIGO Stage IIIC1 (cT1b, cN1a, cM0) - Signed by Lafonda Mosses, MD on 02/02/2021 Histopathologic type: Mixed cell adenocarcinoma Stage prefix: Initial  diagnosis Method of lymph node assessment: Other Histologic grade (G): G3 Histologic grading system: 3 grade system Lymph-vascular invasion (LVI): LVI present/identified, NOS Peritoneal cytology results: Negative Pelvic nodal status: Positive Number of pelvic nodes positive from dissection: 4 Number of pelvic nodes examined during dissection: 8 Para-aortic status: Not assessed Lymph node metastasis: Present Omentectomy performed: Yes Morcellation performed: No     Interval History: Doing well, pain improving. Denies vaginal bleeding or discharge. Reports bowel function, voiding freely. Tolerating regular diet without nausea or emesis.   Past Medical/Surgical History: Past Medical History:  Diagnosis Date  . Anginal pain ([place]) 2016  . Cancer ([place])   . Coronary atherosclerosis of native coronary artery, with stent to LCX in 2006 06/04/2013  . Diabetes mellitus without complication ([place])   . GERD (gastroesophageal reflux disease)   . Heart attack ([place]) 2006  . Hyperlipidemia LDL goal < 70 06/07/2013  . Hypertension   . Obesity   . S/P coronary artery stent placement to RCA with PROMUS DES, 06/07/13, residual disease on OM1, OM2 and rPDA 06/07/2013    Past Surgical History:  Procedure Laterality Date  . CARDIAC CATHETERIZATION N/A 08/22/2015  Procedure: Left Heart Cath and Coronary Angiography;  Surgeon: Leonie Man, MD;  Location: [place] [facility];  Service: Cardiovascular;  Laterality: N/A;  . CARDIAC CATHETERIZATION N/A 08/22/2015   Procedure: Coronary Stent Intervention;  Surgeon: Leonie Man, MD;  Location: [place] [facility];  Service: Cardiovascular;  Laterality: N/A;  . CARDIAC CATHETERIZATION N/A 08/22/2015   Procedure: Left Heart Cath and Coronary Angiography;  Surgeon: Leonie Man, MD;  Location: [place] [facility];  Service: Cardiovascular;  Laterality: N/A;  . CORONARY STENT PLACEMENT  2006   TAXUS stent CFX in [facility]  . LEFT HEART CATH AND CORONARY  ANGIOGRAPHY N/A 07/21/2018   Procedure: LEFT HEART CATH AND CORONARY ANGIOGRAPHY;  Surgeon: Troy Sine, MD;  Location: [place] [facility];  Service: Cardiovascular;  Laterality: N/A;  . LEFT HEART CATHETERIZATION WITH CORONARY ANGIOGRAM N/A 06/07/2013   Procedure: LEFT HEART CATHETERIZATION WITH CORONARY ANGIOGRAM;  Surgeon: Sanda Klein, MD;  Location: [place] [facility];  Service: Cardiovascular;  Laterality: N/A;  . LYMPH NODE DISSECTION N/A 01/29/2021   Procedure: SELECTIVE LYMPH NODE DISSECTION;  Surgeon: Lafonda Mosses, MD;  Location: [facility];  Service: Gynecology;  Laterality: N/A;  . ROBOTIC ASSISTED TOTAL HYSTERECTOMY WITH BILATERAL SALPINGO OOPHERECTOMY Bilateral 01/29/2021   Procedure: XI ROBOTIC ASSISTED TOTAL HYSTERECTOMY WITH BILATERAL SALPINGO OOPHORECTOMY,;  Surgeon: Lafonda Mosses, MD;  Location: [facility];  Service: Gynecology;  Laterality: Bilateral;  . SENTINEL NODE BIOPSY N/A 01/29/2021   Procedure: SENTINEL NODE BIOPSY;  Surgeon: Lafonda Mosses, MD;  Location: [facility];  Service: Gynecology;  Laterality: N/A;    Family History  Problem Relation Age of Onset  . Diabetes Mother   . Hypertension Mother   . Diabetes Sister   . Diabetes Brother   . Hypertension Brother   . Colon cancer Father   . Breast cancer Neg Hx   . Ovarian cancer Neg Hx   . Endometrial cancer Neg Hx   . Prostate cancer Neg Hx   . Pancreatic cancer Neg Hx     Social History   Socioeconomic History  . Marital status: Divorced    Spouse name: Not on file  . Number of children: 1  . Years of education: Not on file  . Highest education level: Not on file  Occupational History  . Occupation: Scientist, water quality  Tobacco Use  . Smoking status: Former Smoker    Years: 25.00    Types: Cigars    Quit date: 06/23/2004    Years since quitting: 16.6  . Smokeless tobacco: Never Used  . Tobacco comment: "a few black and milds a day" CigrettesX 30 years  Vaping Use  . Vaping Use: Never used  Substance and  Sexual Activity  . Alcohol use: Yes    Comment: occ  . Drug use: No  . Sexual activity: Not Currently  Other Topics Concern  . Not on file  Social History Narrative  . Not on file   Social Determinants of Health   Financial Resource Strain: Not on file  Food Insecurity: No Food Insecurity  . Worried About Charity fundraiser in the Last Year: Never true  . Ran Out of Food in the Last Year: Never true  Transportation Needs: No Transportation Needs  . Lack of Transportation (Medical): No  . Lack of Transportation (Non-Medical): No  Physical Activity: Not on file  Stress: Not on file  Social Connections: Not on file    Current Medications:  Current Outpatient  Medications:  .  acetaminophen (TYLENOL) 500 MG tablet, Take 1 tablet (500 mg total) by mouth every 6 (six) hours as needed., Disp: 30 tablet, Rfl: 0 .  aspirin EC 81 MG EC tablet, Take 1 tablet (81 mg total) by mouth daily., Disp: , Rfl:  .  atorvastatin (LIPITOR) 80 MG tablet, TAKE 1 TABLET (80 MG TOTAL) BY MOUTH AT BEDTIME., Disp: 30 tablet, Rfl: 6 .  bismuth subsalicylate (PEPTO BISMOL) 262 MG/15ML suspension, Take 30 mLs by mouth every 6 (six) hours as needed for indigestion or diarrhea or loose stools., Disp: , Rfl:  .  Dulaglutide (TRULICITY) 1.5 BZ/1.6RC SOPN, Inject 1.5 mg into the skin once a week. (Patient taking differently: Inject 1.5 mg into the skin every Friday.), Disp: 4 pen, Rfl: 3 .  furosemide (LASIX) 20 MG tablet, TAKE 2 TABLETS (40 MG TOTAL) BY MOUTH DAILY. (Patient taking differently: Take 40 mg by mouth daily.), Disp: 60 tablet, Rfl: 2 .  glimepiride (AMARYL) 4 MG tablet, TAKE 2 TABLETS (8 MG TOTAL) BY MOUTH DAILY WITH BREAKFAST., Disp: 60 tablet, Rfl: 6 .  Insulin Pen Needle (TRUEPLUS PEN NEEDLES) 32G X 4 MM MISC, Use as directed to inject victoza once daily, Disp: 100 each, Rfl: 3 .  isosorbide mononitrate (IMDUR) 30 MG 24 hr tablet, TAKE 2 TABLETS (60 MG TOTAL) BY MOUTH DAILY., Disp: 60 tablet, Rfl:  1 .  metFORMIN (GLUCOPHAGE) 500 MG tablet, TAKE 2 TABLETS (1,000 MG TOTAL) BY MOUTH 2 (TWO) TIMES DAILY WITH A MEAL., Disp: 120 tablet, Rfl: 1 .  metoprolol tartrate (LOPRESSOR) 100 MG tablet, Take 1 tablet (100 mg total) by mouth 2 (two) times daily., Disp: 60 tablet, Rfl: 6 .  nitroGLYCERIN (NITROSTAT) 0.4 MG SL tablet, Place 1 tablet (0.4 mg total) under the tongue every 5 (five) minutes as needed for chest pain., Disp: 25 tablet, Rfl: 2 .  pantoprazole (PROTONIX) 40 MG tablet, TAKE 1 TABLET (40 MG TOTAL) BY MOUTH DAILY., Disp: 30 tablet, Rfl: 6 .  senna-docusate (SENOKOT-S) 8.6-50 MG tablet, Take 2 tablets by mouth at bedtime. For AFTER surgery, do not take if having diarrhea (Patient not taking: No sig reported), Disp: 30 tablet, Rfl: 0 .  traMADol (ULTRAM) 50 MG tablet, Take 1 tablet (50 mg total) by mouth every 6 (six) hours as needed for severe pain. For AFTER surgery only, do not take and drive (Patient not taking: No sig reported), Disp: 10 tablet, Rfl: 0  Review of Symptoms: Pertinent positive as per HPI.  Physical Exam: There were no vitals taken for this visit. Deferred given limitations of phone visit  Laboratory & Radiologic Studies: none  Assessment & Plan: [name] is a 65 y.o. woman with Stage IIIC1 serous uterine carcinoma who presents for follow-up after surgery.  The patient is doing well from a post-op standpoint, meeting milestones. Discussed continued restrictions and expectations. I reviewed in detail pathology report with patient and her daughter. All questions answered. Given metastatic disease and high risk histology, reviewed recommendation for systemic therapy (chemotherapy, depending on HER-2 FISH results may also be a candidate for trastuzamab) as well as vaginal brachytherapy (decrease risk of local recurrence in the setting of cervical stromal involvement. Patient understanding and wishing to proceed with adjuvant therapy. Will make referral to both Dr.  Alvy Bimler and Dr. Sondra Come.   I discussed the assessment and treatment plan with the patient. The patient was provided with an opportunity to ask questions and all were answered. The patient agreed with the  plan and demonstrated an understanding of the instructions.   The patient was advised to call back or see an in-person evaluation if the symptoms worsen or if the condition fails to improve as anticipated.   22 minutes of total time was spent for this patient encounter, including preparation, face-to-face counseling with the patient and coordination of care, and documentation of the encounter.   Jeral Pinch, MD  Division of Gynecologic Oncology  Department of Obstetrics and Gynecology  [facility]

## 2021-02-02 NOTE — Progress Notes (Signed)
Called [name] and scheduled her to see Dr. Alvy Bimler on 02/13/21 at 1:00 (arrive at 12:30, bring a family member) and for patient education afterwards. Also advised that Radiation Oncology will be calling to schedule her with Dr. Sondra Come.  She verbalized understanding and agreement.

## 2021-02-07 LAB — SURGICAL PATHOLOGY

## 2021-02-12 ENCOUNTER — Inpatient Hospital Stay: Payer: Self-pay | Admitting: Gynecologic Oncology

## 2021-02-12 ENCOUNTER — Other Ambulatory Visit: Payer: Self-pay | Admitting: Oncology

## 2021-02-12 NOTE — Progress Notes (Signed)
Gynecologic Oncology Multi-Disciplinary Disposition Conference Note  Date of the Conference: 02/12/2021  Patient Name: [name]  Referring Provider: Dr. Sabra Heck Primary GYN Oncologist: Dr. 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   This Multidisciplinary conference took place involving physicians from [place], [place], Radiation Oncology, Pathology, Radiology along with the Gynecologic Oncology Nurse Practitioner and RN.  Comprehensive assessment of the patient's malignancy, staging, need for surgery, chemotherapy, radiation therapy, and need for further testing were reviewed. Supportive measures, both inpatient and following discharge were also discussed. The recommended plan of care is documented. Greater than 35 minutes were spent correlating and coordinating this patient's care.

## 2021-02-13 ENCOUNTER — Inpatient Hospital Stay: Payer: Self-pay | Attending: Gynecologic Oncology | Admitting: Hematology and Oncology

## 2021-02-13 ENCOUNTER — Inpatient Hospital Stay: Payer: Self-pay

## 2021-02-13 ENCOUNTER — Other Ambulatory Visit: Payer: Self-pay | Admitting: Hematology and Oncology

## 2021-02-13 ENCOUNTER — Other Ambulatory Visit: Payer: Self-pay

## 2021-02-13 VITALS — BP 161/72 | HR 76 | Temp 97.4°F | Resp 18 | Ht 62.0 in | Wt 217.2 lb

## 2021-02-13 DIAGNOSIS — Z794 Long term (current) use of insulin: Secondary | ICD-10-CM | POA: Insufficient documentation

## 2021-02-13 DIAGNOSIS — E669 Obesity, unspecified: Secondary | ICD-10-CM

## 2021-02-13 DIAGNOSIS — Z7982 Long term (current) use of aspirin: Secondary | ICD-10-CM | POA: Insufficient documentation

## 2021-02-13 DIAGNOSIS — Z5111 Encounter for antineoplastic chemotherapy: Secondary | ICD-10-CM | POA: Insufficient documentation

## 2021-02-13 DIAGNOSIS — I5032 Chronic diastolic (congestive) heart failure: Secondary | ICD-10-CM

## 2021-02-13 DIAGNOSIS — E785 Hyperlipidemia, unspecified: Secondary | ICD-10-CM | POA: Insufficient documentation

## 2021-02-13 DIAGNOSIS — I2510 Atherosclerotic heart disease of native coronary artery without angina pectoris: Secondary | ICD-10-CM | POA: Insufficient documentation

## 2021-02-13 DIAGNOSIS — Z87891 Personal history of nicotine dependence: Secondary | ICD-10-CM | POA: Insufficient documentation

## 2021-02-13 DIAGNOSIS — I10 Essential (primary) hypertension: Secondary | ICD-10-CM

## 2021-02-13 DIAGNOSIS — C541 Malignant neoplasm of endometrium: Secondary | ICD-10-CM

## 2021-02-13 DIAGNOSIS — E119 Type 2 diabetes mellitus without complications: Secondary | ICD-10-CM | POA: Insufficient documentation

## 2021-02-13 DIAGNOSIS — Z79899 Other long term (current) drug therapy: Secondary | ICD-10-CM | POA: Insufficient documentation

## 2021-02-13 DIAGNOSIS — E1169 Type 2 diabetes mellitus with other specified complication: Secondary | ICD-10-CM

## 2021-02-13 DIAGNOSIS — I110 Hypertensive heart disease with heart failure: Secondary | ICD-10-CM | POA: Insufficient documentation

## 2021-02-13 DIAGNOSIS — Z800 Family history of malignant neoplasm of digestive organs: Secondary | ICD-10-CM | POA: Insufficient documentation

## 2021-02-13 DIAGNOSIS — I700 Atherosclerosis of aorta: Secondary | ICD-10-CM | POA: Insufficient documentation

## 2021-02-13 DIAGNOSIS — K219 Gastro-esophageal reflux disease without esophagitis: Secondary | ICD-10-CM | POA: Insufficient documentation

## 2021-02-13 MED ORDER — PROCHLORPERAZINE MALEATE 10 MG PO TABS: 10.0000 mg | ORAL_TABLET | Freq: Four times a day (QID) | ORAL | 1 refills | Status: DC | PRN

## 2021-02-13 MED ORDER — ONDANSETRON HCL 8 MG PO TABS: 8.0000 mg | ORAL_TABLET | Freq: Three times a day (TID) | ORAL | 1 refills | Status: DC | PRN

## 2021-02-13 MED ORDER — LIDOCAINE-PRILOCAINE 2.5-2.5 % EX CREA: TOPICAL_CREAM | CUTANEOUS | 3 refills | Status: DC

## 2021-02-13 MED ORDER — DEXAMETHASONE 4 MG PO TABS: ORAL_TABLET | ORAL | 6 refills | Status: DC

## 2021-02-13 MED FILL — ONDANSETRON HCL 8 MG TABLET: 8 | 10 days supply | Qty: 30 | Fill #0

## 2021-02-13 MED FILL — LIDOCAINE-PRILOCAINE CREAM: 2.5-2.5 | 30 days supply | Qty: 30 | Fill #0

## 2021-02-13 MED FILL — PROCHLORPERAZINE 10 MG TAB: 10 | 7 days supply | Qty: 30 | Fill #0

## 2021-02-13 MED FILL — DEXAMETHASONE 4 MG TABLET: 4 | 84 days supply | Qty: 8 | Fill #0

## 2021-02-13 NOTE — Progress Notes (Signed)
START ON PATHWAY REGIMEN - Uterine     A cycle is every 21 days:     Paclitaxel      Carboplatin   **Always confirm dose/schedule in your pharmacy ordering system**  Patient Characteristics: Serous Carcinoma, Newly Diagnosed, Postoperative (Pathologic Staging), Postoperative Histology: Serous Carcinoma Therapeutic Status: Newly Diagnosed, Postoperative (Pathologic Staging) AJCC M Category: cM0 AJCC 8 Stage Grouping: IIIC1 AJCC T Category: pT1 AJCC N Category: pN1 Intent of Therapy: Curative Intent, Discussed with Patient

## 2021-02-14 ENCOUNTER — Encounter: Payer: Self-pay | Admitting: Hematology and Oncology

## 2021-02-14 NOTE — Assessment & Plan Note (Signed)
I recommend aggressive lifestyle modification and dietary changes while on treatment

## 2021-02-14 NOTE — Assessment & Plan Note (Signed)
We reviewed the NCCN guidelines We discussed the role of chemotherapy. The intent is of curative intent.  We discussed some of the risks, benefits, side-effects of carboplatin & Taxol. Treatment is intravenous, every 3 weeks x 6 cycles  Some of the short term side-effects included, though not limited to, including weight loss, life threatening infections, risk of allergic reactions, need for transfusions of blood products, nausea, vomiting, change in bowel habits, loss of hair, admission to hospital for various reasons, and risks of death.   Long term side-effects are also discussed including risks of infertility, permanent damage to nerve function, hearing loss, chronic fatigue, kidney damage with possibility needing hemodialysis, and rare secondary malignancy including bone marrow disorders.  The patient is aware that the response rates discussed earlier is not guaranteed.  After a long discussion, patient made an informed decision to proceed with the prescribed plan of care.   Patient education material was dispensed. We discussed premedication with dexamethasone before chemotherapy. I recommend port placement due to poor venous access Due to high BMI, I plan to use carboplatin AUC of 5 and paclitaxel at 140 mg per metered square I do not plan G-CSF support

## 2021-02-14 NOTE — Progress Notes (Signed)
[place] CONSULT NOTE  Patient Care Team: Elizabeth Rakes, MD as PCP - General (Family Medicine) [name], Elizabeth Gobble, MD as PCP - Cardiology (Cardiology)  ASSESSMENT & PLAN:  Papillary serous adenocarcinoma of endometrium [facility]) We reviewed the NCCN guidelines We discussed the role of chemotherapy. The intent is of curative intent.  We discussed some of the risks, benefits, side-effects of carboplatin & Taxol. Treatment is intravenous, every 3 weeks x 6 cycles  Some of the short term side-effects included, though not limited to, including weight loss, life threatening infections, risk of allergic reactions, need for transfusions of blood products, nausea, vomiting, change in bowel habits, loss of hair, admission to hospital for various reasons, and risks of death.   Long term side-effects are also discussed including risks of infertility, permanent damage to nerve function, hearing loss, chronic fatigue, kidney damage with possibility needing hemodialysis, and rare secondary malignancy including bone marrow disorders.  The patient is aware that the response rates discussed earlier is not guaranteed.  After a long discussion, patient made an informed decision to proceed with the prescribed plan of care.   Patient education material was dispensed. We discussed premedication with dexamethasone before chemotherapy. I recommend port placement due to poor venous access Due to high BMI, I plan to use carboplatin AUC of 5 and paclitaxel at 140 mg per metered square I do not plan G-CSF support  Chronic diastolic CHF (congestive heart failure) ([place]) I am concerned about her cardiovascular risk factors Recommend echocardiogram for evaluation  Diabetes mellitus type 2 in obese ([place]) I recommend aggressive lifestyle modification and dietary changes while on treatment  Essential hypertension Her blood pressure is elevated today could be due to anxiety I recommend she start checking  blood pressure at home We discussed the importance of aggressive risk factor modifications   Orders Placed This Encounter  Procedures  . IR IMAGING GUIDED PORT INSERTION    Standing Status:   Future    Standing Expiration Date:   02/13/2022    Order Specific Question:   Reason for Exam (SYMPTOM  OR DIAGNOSIS REQUIRED)    Answer:   need port for chemo to start 3/14    Order Specific Question:   Preferred Imaging Location?    Answer:   [facility]  . CBC with Differential ([place] Only)    Standing Status:   Standing    Number of Occurrences:   20    Standing Expiration Date:   02/13/2022  . CMP ([place] only)    Standing Status:   Standing    Number of Occurrences:   20    Standing Expiration Date:   02/13/2022  . ECHOCARDIOGRAM COMPLETE    Standing Status:   Future    Standing Expiration Date:   02/13/2022    Order Specific Question:   Where should this test be performed    Answer:   [place]    Order Specific Question:   Perflutren DEFINITY (image enhancing agent) should be administered unless hypersensitivity or allergy exist    Answer:   Administer Perflutren    Order Specific Question:   Reason for exam-Echo    Answer:   Chemo  [id number]    The total time spent in the appointment was 80 minutes encounter with patients including review of chart and various tests results, discussions about plan of care and coordination of care plan   All questions were answered. The patient knows to call the clinic with any  problems, questions or concerns. No barriers to learning was detected.  Elizabeth Lark, MD 3/2/202210:40 AM  CHIEF COMPLAINTS/PURPOSE OF CONSULTATION:  Uterine cancer, for further management  HISTORY OF PRESENTING ILLNESS:  [name] 65 y.o. female is here because of recent diagnosis of uterine cancer Her daughter, [name] is with her today Her original diagnosis was made because she had postmenopausal bleeding  I have reviewed her chart and materials  related to her cancer extensively and collaborated history with the patient. Summary of oncologic history is as follows: Oncology History Overview Note  Pap 12/14/20: adenocarcinoma, HPV negative  HER-2 positive by FISH Her-2 equivocal by [facility] stable    Papillary serous adenocarcinoma of endometrium ([place])   Initial Diagnosis   Endometrial carcinoma ([place])   01/12/2021 Initial Biopsy   A. ENDOCERVIX, CURETTAGE:  - Adenocarcinoma.  B. ENDOMETRIUM, BIOPSY:  - Adenocarcinoma.  COMMENT:  The differential diagnosis includes high grade serous carcinoma.  Both  specimens have a similar morphology; high grade serous carcinoma more  commonly arises in the endometrium.  Results reported to [name]  on 01/15/2021.  [name] reviewed the case.    01/16/2021 Tumor Marker   Patient's tumor was tested for the following markers: CA-125 Results of the tumor marker test revealed normal value, 10.7   01/25/2021 Imaging   CT C/A/P: 1. Small volume fluid in the endometrial canal with 7 mm hypoattenuating lesion in the myometrium of the anterior fundus. 2. No definite evidence for metastatic disease in the chest, abdomen, or pelvis. Small lymph nodes are noted along both pelvic sidewalls. Attention on follow-up recommended. 3. 5 mm ground-glass opacity peripheral left upper lobe. This is likely related to infection/inflammation and potentially scar. Attention on surveillance imaging recommended. 4. 5.8 cm lesion posterior lower uterine segment likely a fibroid. Ultrasound exam from 01/28/2010 demonstrated a 5.2 cm lesion in the left aspect of the posterior lower uterine body. 5. Aortic Atherosclerosis (ICD10-I70.0).   01/29/2021 Surgery   TRH/BSO, SLN biopsy left, selective pelvic LND right, omentectomy  Findings: On EUA, 10-12cm enlarged mobile uterus. On intra-abdomina entry, minimal adhesions between the liver and anterior abdomen on the right. Some changes c/w fatty liver on the  left. Omentum, stomach, small and large bowel all grossly normal. Bilateral ovaries normal appearing. Uterus with 5-6cm posterior fibroid, otherwise normal appearing. No mapping to right pelvis. On left, mapping to the level of the superior vessel artery, enlarged lymph node noted (likely sentinel) within the upper aspect of the obturator space. In bilateral pelvic basins, multiple enlarged lymph nodes. On the right, enlarged lymph nodes extended superiorly along the common iliac vessels. At the end of surgery, no obvious abdominal or pelvic evidence of disease.   01/29/2021 Pathology Results   A. SENTINEL LYMPH NODE, LEFT INTERNAL ILIAC, BIOPSY:  - Metastatic carcinoma in (1) of (1) lymph node.   B. SENTINEL LYMPH NODE, LEFT OBTURATOR, BIOPSY:  - Metastatic carcinoma in (1) of (1) lymph node.   C. LYMPH NODES, LEFT PELVIC, DISSECTION:  - Metastatic carcinoma in (1) of (3) lymph nodes.   D. LYMPH NODE, RIGHT EXTERNAL AND COMMON ILIAC, BIOPSY:  - Metastatic carcinoma in (1) of (1) lymph node.   E. UTERUS, CERVIX AND BILATERAL FALLOPIAN TUBES AND OVARIES, TOTAL  HYSTERECTOMY AND BILATERAL SALPINGO-OOPHORECTOMY:  - High grade serous carcinoma of endometrium, with invasion more than  half of the myometrium.  - Tumor invades the stromal connective tissue of the cervix.  - No involvement of  uterine serosa or adnexa.  - Lymphovascular invasion is identified.  - See oncology table.   F. OMENTUM, OMENTECTOMY:  - Omentum, negative for carcinoma.   G. LYMPH NODES, RIGHT PELVIC, DISSECTION:  - Two lymph nodes, negative for carcinoma (0/2).   ONCOLOGY TABLE:   UTERUS, CARCINOMA OR CARCINOSARCOMA: Resection   Procedure: Total hysterectomy and bilateral salpingo-oophorectomy,  Omentectomy, Lymph node sampling  Histologic Type: Serous carcinoma  Histologic Grade: High grade  Myometrial Invasion: > 50%  Uterine Serosa Involvement: Not identified  Cervical Stroma Involvement: Present  Other  Tissue/Organ Involvement: Not identified  Peritoneal/Ascitic Fluid: Negative for carcinoma  Lymphovascular Invasion: Present  Regional Lymph Nodes:       Pelvic Lymph Nodes Examined:            2 Sentinel            6 Non-Sentinel            8 Total       Pelvic Lymph Nodes with Metastasis: 4            Macrometastasis: 3            Micrometastasis: 1            Isolated Tumor Cells: 0            Laterality of Lymph Nodes with Tumor: Right (non-sentinel),  Left (sentinel and non-sentinel)            Extracapsular Extension: Present       Para-Aortic Lymph Nodes Examined: Not applicable        Para-Aortic Lymph Nodes with Metastasis: Not applicable  Distant Metastasis:       Distant Site(s) Involved: Omentum: Not involved  Pathologic Stage Classification (pTNM, AJCC 8th Edition): pT2, pN1a  Ancillary Studies: HER2 will be ordered  Additional Findings: Leiomyomata.  Adenomyosis.  Representative Tumor Block: B1  Comment: [name] reviewed select slides.  (v4.2.0.1)    01/29/2021 Cancer Staging   Staging form: Corpus Uteri - Carcinoma and Carcinosarcoma, AJCC 8th Edition - Clinical stage from 01/29/2021: FIGO Stage IIIC1 (cT1b, cN1a, cM0) - Signed by Lafonda Mosses, MD on 02/02/2021 Histopathologic type: Mixed cell adenocarcinoma Stage prefix: Initial diagnosis Method of lymph node assessment: Other Histologic grade (G): G3 Histologic grading system: 3 grade system Lymph-vascular invasion (LVI): LVI present/identified, NOS Peritoneal cytology results: Negative Pelvic nodal status: Positive Number of pelvic nodes positive from dissection: 4 Number of pelvic nodes examined during dissection: 8 Para-aortic status: Not assessed Lymph node metastasis: Present Omentectomy performed: Yes Morcellation performed: No   02/26/2021 -  Chemotherapy    Patient is on Treatment Plan: UTERINE CARBOPLATIN AUC 6 / PACLITAXEL Q21D       She is doing well after surgery She denies  significant abdominal pain No recent changes in bowel habits She has not been checking her blood sugar blood pressure on a regular basis  MEDICAL HISTORY:  Past Medical History:  Diagnosis Date  . Anginal pain ([place]) 2016  . Cancer ([place])   . Coronary atherosclerosis of native coronary artery, with stent to LCX in 2006 06/04/2013  . Diabetes mellitus without complication ([place])   . GERD (gastroesophageal reflux disease)   . Heart attack ([place]) 2006  . Hyperlipidemia LDL goal < 70 06/07/2013  . Hypertension   . Obesity   . S/P coronary artery stent placement to RCA with PROMUS DES, 06/07/13, residual disease on OM1, OM2 and rPDA 06/07/2013    SURGICAL HISTORY: Past Surgical  History:  Procedure Laterality Date  . CARDIAC CATHETERIZATION N/A 08/22/2015   Procedure: Left Heart Cath and Coronary Angiography;  Surgeon: Leonie Man, MD;  Location: [place] [facility];  Service: Cardiovascular;  Laterality: N/A;  . CARDIAC CATHETERIZATION N/A 08/22/2015   Procedure: Coronary Stent Intervention;  Surgeon: Leonie Man, MD;  Location: [place] [facility];  Service: Cardiovascular;  Laterality: N/A;  . CARDIAC CATHETERIZATION N/A 08/22/2015   Procedure: Left Heart Cath and Coronary Angiography;  Surgeon: Leonie Man, MD;  Location: [place] [facility];  Service: Cardiovascular;  Laterality: N/A;  . CORONARY STENT PLACEMENT  2006   TAXUS stent CFX in [facility]  . LEFT HEART CATH AND CORONARY ANGIOGRAPHY N/A 07/21/2018   Procedure: LEFT HEART CATH AND CORONARY ANGIOGRAPHY;  Surgeon: Troy Sine, MD;  Location: [place] [facility];  Service: Cardiovascular;  Laterality: N/A;  . LEFT HEART CATHETERIZATION WITH CORONARY ANGIOGRAM N/A 06/07/2013   Procedure: LEFT HEART CATHETERIZATION WITH CORONARY ANGIOGRAM;  Surgeon: Sanda Klein, MD;  Location: [place] CATH LAB;  Service: Cardiovascular;  Laterality: N/A;  . LYMPH NODE DISSECTION N/A 01/29/2021   Procedure: SELECTIVE LYMPH NODE DISSECTION;  Surgeon:  Lafonda Mosses, MD;  Location: [facility];  Service: Gynecology;  Laterality: N/A;  . ROBOTIC ASSISTED TOTAL HYSTERECTOMY WITH BILATERAL SALPINGO OOPHERECTOMY Bilateral 01/29/2021   Procedure: XI ROBOTIC ASSISTED TOTAL HYSTERECTOMY WITH BILATERAL SALPINGO OOPHORECTOMY,;  Surgeon: Lafonda Mosses, MD;  Location: [facility];  Service: Gynecology;  Laterality: Bilateral;  . SENTINEL NODE BIOPSY N/A 01/29/2021   Procedure: SENTINEL NODE BIOPSY;  Surgeon: Lafonda Mosses, MD;  Location: [facility];  Service: Gynecology;  Laterality: N/A;    SOCIAL HISTORY: Social History   Socioeconomic History  . Marital status: Divorced    Spouse name: Not on file  . Number of children: 1  . Years of education: Not on file  . Highest education level: Not on file  Occupational History  . Occupation: Scientist, water quality  Tobacco Use  . Smoking status: Former Smoker    Years: 25.00    Types: Cigars    Quit date: 06/23/2004    Years since quitting: 16.6  . Smokeless tobacco: Never Used  . Tobacco comment: "a few black and milds a day" CigrettesX 30 years  Vaping Use  . Vaping Use: Never used  Substance and Sexual Activity  . Alcohol use: Yes    Comment: occ  . Drug use: No  . Sexual activity: Not Currently  Other Topics Concern  . Not on file  Social History Narrative  . Not on file   Social Determinants of Health   Financial Resource Strain: Not on file  Food Insecurity: No Food Insecurity  . Worried About Charity fundraiser in the Last Year: Never true  . Ran Out of Food in the Last Year: Never true  Transportation Needs: No Transportation Needs  . Lack of Transportation (Medical): No  . Lack of Transportation (Non-Medical): No  Physical Activity: Not on file  Stress: Not on file  Social Connections: Not on file  Intimate Partner Violence: Not on file    FAMILY HISTORY: Family History  Problem Relation Age of Onset  . Diabetes Mother   . Hypertension Mother   . Diabetes Sister   . Diabetes  Brother   . Hypertension Brother   . Colon cancer Father   . Breast cancer Neg Hx   . Ovarian cancer Neg Hx   . Endometrial cancer Neg  Hx   . Prostate cancer Neg Hx   . Pancreatic cancer Neg Hx     ALLERGIES:  has No Known Allergies.  MEDICATIONS:  Current Outpatient Medications  Medication Sig Dispense Refill  . dexamethasone (DECADRON) 4 MG tablet Take 2 tabs at the night before chemotherapy, every 3 weeks, by mouth x 6 cycles 12 tablet 6  . acetaminophen (TYLENOL) 500 MG tablet Take 1 tablet (500 mg total) by mouth every 6 (six) hours as needed. 30 tablet 0  . aspirin EC 81 MG EC tablet Take 1 tablet (81 mg total) by mouth daily.    Marland Kitchen atorvastatin (LIPITOR) 80 MG tablet TAKE 1 TABLET (80 MG TOTAL) BY MOUTH AT BEDTIME. 30 tablet 6  . bismuth subsalicylate (PEPTO BISMOL) 262 MG/15ML suspension Take 30 mLs by mouth every 6 (six) hours as needed for indigestion or diarrhea or loose stools.    . Dulaglutide (TRULICITY) 1.5 WP/8.0DX SOPN Inject 1.5 mg into the skin once a week. (Patient taking differently: Inject 1.5 mg into the skin every Friday.) 4 pen 3  . furosemide (LASIX) 20 MG tablet TAKE 2 TABLETS (40 MG TOTAL) BY MOUTH DAILY. (Patient taking differently: Take 40 mg by mouth daily.) 60 tablet 2  . glimepiride (AMARYL) 4 MG tablet TAKE 2 TABLETS (8 MG TOTAL) BY MOUTH DAILY WITH BREAKFAST. 60 tablet 6  . Insulin Pen Needle (TRUEPLUS PEN NEEDLES) 32G X 4 MM MISC Use as directed to inject victoza once daily 100 each 3  . isosorbide mononitrate (IMDUR) 30 MG 24 hr tablet TAKE 2 TABLETS (60 MG TOTAL) BY MOUTH DAILY. 60 tablet 1  . lidocaine-prilocaine (EMLA) cream Apply to affected area once 30 g 3  . metFORMIN (GLUCOPHAGE) 500 MG tablet TAKE 2 TABLETS (1,000 MG TOTAL) BY MOUTH 2 (TWO) TIMES DAILY WITH A MEAL. 120 tablet 1  . metoprolol tartrate (LOPRESSOR) 100 MG tablet Take 1 tablet (100 mg total) by mouth 2 (two) times daily. 60 tablet 6  . nitroGLYCERIN (NITROSTAT) 0.4 MG SL tablet  Place 1 tablet (0.4 mg total) under the tongue every 5 (five) minutes as needed for chest pain. 25 tablet 2  . ondansetron (ZOFRAN) 8 MG tablet Take 1 tablet (8 mg total) by mouth every 8 (eight) hours as needed. 30 tablet 1  . pantoprazole (PROTONIX) 40 MG tablet TAKE 1 TABLET (40 MG TOTAL) BY MOUTH DAILY. 30 tablet 6  . prochlorperazine (COMPAZINE) 10 MG tablet Take 1 tablet (10 mg total) by mouth every 6 (six) hours as needed (Nausea or vomiting). 30 tablet 1  . senna-docusate (SENOKOT-S) 8.6-50 MG tablet Take 2 tablets by mouth at bedtime. For AFTER surgery, do not take if having diarrhea (Patient not taking: No sig reported) 30 tablet 0  . traMADol (ULTRAM) 50 MG tablet Take 1 tablet (50 mg total) by mouth every 6 (six) hours as needed for severe pain. For AFTER surgery only, do not take and drive (Patient not taking: No sig reported) 10 tablet 0   No current facility-administered medications for this visit.    REVIEW OF SYSTEMS:   Constitutional: Denies fevers, chills or abnormal night sweats Eyes: Denies blurriness of vision, double vision or watery eyes Ears, nose, mouth, throat, and face: Denies mucositis or sore throat Respiratory: Denies cough, dyspnea or wheezes Cardiovascular: Denies palpitation, chest discomfort or lower extremity swelling Gastrointestinal:  Denies nausea, heartburn or change in bowel habits Skin: Denies abnormal skin rashes Lymphatics: Denies new lymphadenopathy or easy bruising Neurological:Denies numbness, tingling  or new weaknesses Behavioral/Psych: Mood is stable, no new changes  All other systems were reviewed with the patient and are negative.  PHYSICAL EXAMINATION: ECOG PERFORMANCE STATUS: 1 - Symptomatic but completely ambulatory  Vitals:   02/13/21 1250  BP: (!) 161/72  Pulse: 76  Resp: 18  Temp: (!) 97.4 F (36.3 C)  SpO2: (!) 1%   Filed Weights   02/13/21 1250  Weight: 217 lb 3.2 oz (98.5 kg)    GENERAL:alert, no distress and  comfortable SKIN: skin color, texture, turgor are normal, no rashes or significant lesions EYES: normal, conjunctiva are pink and non-injected, sclera clear OROPHARYNX:no exudate, no erythema and lips, buccal mucosa, and tongue normal  NECK: supple, thyroid normal size, non-tender, without nodularity LYMPH:  no palpable lymphadenopathy in the cervical, axillary or inguinal LUNGS: clear to auscultation and percussion with normal breathing effort HEART: regular rate & rhythm and no murmurs and no lower extremity edema ABDOMEN:abdomen soft, non-tender and normal bowel sounds.  Noted well-healed surgical scar Musculoskeletal:no cyanosis of digits and no clubbing  PSYCH: alert & oriented x 3 with fluent speech NEURO: no focal motor/sensory deficits  LABORATORY DATA:  I have reviewed the data as listed Lab Results  Component Value Date   WBC 7.2 01/24/2021   HGB 13.2 01/24/2021   HCT 41.2 01/24/2021   MCV 92.8 01/24/2021   PLT 319 01/24/2021   Recent Labs    04/18/20 1030 10/18/20 1028 11/21/20 1153 01/16/21 1315 01/24/21 1147  NA 140 143 135 141 141  K 4.7 4.5 3.3* 3.5 3.5  CL 104 106 101 103 104  CO2 '20 24 24 28 24  ' GLUCOSE 286* 117* 203* 154* 223*  BUN '24 13 10 18 20  ' CREATININE 1.05* 1.00 0.92 1.18* 1.11*  CALCIUM 9.4 9.7 8.5* 9.6 9.4  GFRNONAA 57* 61 >60 52* 56*  GFRAA 66 70  --   --   --   PROT  --  6.7  --  7.3  --   ALBUMIN  --  4.1  --  3.5  --   AST  --  15  --  17  --   ALT  --  13  --  16  --   ALKPHOS  --  111  --  100  --   BILITOT  --  0.2  --  0.4  --     RADIOGRAPHIC STUDIES: I have personally reviewed the radiological images as listed and agreed with the findings in the report. CT CHEST ABDOMEN PELVIS W CONTRAST  Result Date: 01/26/2021 CLINICAL DATA:  Endometrial cancer.  Staging. EXAM: CT CHEST, ABDOMEN, AND PELVIS WITH CONTRAST TECHNIQUE: Multidetector CT imaging of the chest, abdomen and pelvis was performed following the standard protocol during  bolus administration of intravenous contrast. CONTRAST:  127m OMNIPAQUE IOHEXOL 300 MG/ML  SOLN COMPARISON:  None. FINDINGS: CT CHEST FINDINGS Cardiovascular: The heart size is normal. No substantial pericardial effusion. Coronary artery calcification is evident. Atherosclerotic calcification is noted in the wall of the thoracic aorta. Mediastinum/Nodes: No mediastinal lymphadenopathy. There is no hilar lymphadenopathy. The esophagus has normal imaging features. There is no axillary lymphadenopathy. Lungs/Pleura: 5 mm ground-glass opacity identified peripheral left upper lobe on image 52/5. No overtly suspicious nodule or mass. No focal airspace consolidation. Insert no effusions Musculoskeletal: No worrisome lytic or sclerotic osseous abnormality. Degenerative changes noted right sternoclavicular joint. CT ABDOMEN PELVIS FINDINGS Hepatobiliary: No suspicious focal abnormality within the liver parenchyma. There is no evidence for gallstones, gallbladder  wall thickening, or pericholecystic fluid. No intrahepatic or extrahepatic biliary dilation. Pancreas: No focal mass lesion. No dilatation of the main duct. No intraparenchymal cyst. No peripancreatic edema. Spleen: Capsular retraction overlying focal region of hypoperfusion, potentially from prior infarct. Adrenals/Urinary Tract: No adrenal nodule or mass. Kidneys unremarkable. No evidence for hydroureter. The urinary bladder appears normal for the degree of distention. Stomach/Bowel: Stomach is distended with food and contrast material. Duodenum is normally positioned as is the ligament of Treitz. No small bowel wall thickening. No small bowel dilatation. The terminal ileum is normal. The appendix is normal. No gross colonic mass. No colonic wall thickening. Diverticular changes are noted in the left colon without evidence of diverticulitis. Vascular/Lymphatic: There is abdominal aortic atherosclerosis without aneurysm. There is no gastrohepatic or hepatoduodenal  ligament lymphadenopathy. No retroperitoneal or mesenteric lymphadenopathy. No pelvic sidewall lymphadenopathy by CT size criteria although small lymph nodes are seen along both pelvic sidewalls including 8 mm short axis right external iliac node on 96/3 and 8 mm short axis left external iliac node on 100/3. Reproductive: 5.8 cm lesion posterior lower uterine segment may be a fibroid. There is small volume fluid in the endometrial canal with 7 mm hypoattenuating lesion in the myometrium of the anterior fundus. Heterogeneous enhancement is seen around the endometrial canal, nonspecific. There is no adnexal mass. Other: No intraperitoneal free fluid. Musculoskeletal: No worrisome lytic or sclerotic osseous abnormality. IMPRESSION: 1. Small volume fluid in the endometrial canal with 7 mm hypoattenuating lesion in the myometrium of the anterior fundus. 2. No definite evidence for metastatic disease in the chest, abdomen, or pelvis. Small lymph nodes are noted along both pelvic sidewalls. Attention on follow-up recommended. 3. 5 mm ground-glass opacity peripheral left upper lobe. This is likely related to infection/inflammation and potentially scar. Attention on surveillance imaging recommended. 4. 5.8 cm lesion posterior lower uterine segment likely a fibroid. Ultrasound exam from 01/28/2010 demonstrated a 5.2 cm lesion in the left aspect of the posterior lower uterine body. 5. Aortic Atherosclerosis (ICD10-I70.0). Electronically Signed   By: Misty Stanley M.D.   On: 01/26/2021 10:51

## 2021-02-14 NOTE — Assessment & Plan Note (Signed)
Her blood pressure is elevated today could be due to anxiety I recommend she start checking blood pressure at home We discussed the importance of aggressive risk factor modifications

## 2021-02-14 NOTE — Assessment & Plan Note (Signed)
I am concerned about her cardiovascular risk factors Recommend echocardiogram for evaluation

## 2021-02-16 ENCOUNTER — Encounter: Payer: Self-pay | Admitting: Gynecologic Oncology

## 2021-02-19 ENCOUNTER — Other Ambulatory Visit: Payer: Self-pay | Admitting: Hematology and Oncology

## 2021-02-19 ENCOUNTER — Other Ambulatory Visit: Payer: Self-pay | Admitting: Radiology

## 2021-02-19 ENCOUNTER — Encounter: Payer: Self-pay | Admitting: Gynecologic Oncology

## 2021-02-19 ENCOUNTER — Ambulatory Visit (HOSPITAL_COMMUNITY)
Admission: RE | Admit: 2021-02-19 | Discharge: 2021-02-19 | Disposition: A | Discharge status: Home / Self Care | Payer: Self-pay | Source: Ambulatory Visit | Attending: Hematology and Oncology | Admitting: Hematology and Oncology

## 2021-02-19 ENCOUNTER — Inpatient Hospital Stay (HOSPITAL_BASED_OUTPATIENT_CLINIC_OR_DEPARTMENT_OTHER): Payer: Self-pay | Admitting: Gynecologic Oncology

## 2021-02-19 ENCOUNTER — Inpatient Hospital Stay (HOSPITAL_BASED_OUTPATIENT_CLINIC_OR_DEPARTMENT_OTHER): Payer: Self-pay | Admitting: Hematology and Oncology

## 2021-02-19 ENCOUNTER — Encounter: Payer: Self-pay | Admitting: Hematology and Oncology

## 2021-02-19 ENCOUNTER — Other Ambulatory Visit: Payer: Self-pay

## 2021-02-19 VITALS — BP 154/81 | HR 72 | Temp 96.9°F | Resp 20 | Wt 218.0 lb

## 2021-02-19 DIAGNOSIS — I5032 Chronic diastolic (congestive) heart failure: Secondary | ICD-10-CM

## 2021-02-19 DIAGNOSIS — E119 Type 2 diabetes mellitus without complications: Secondary | ICD-10-CM | POA: Insufficient documentation

## 2021-02-19 DIAGNOSIS — C541 Malignant neoplasm of endometrium: Secondary | ICD-10-CM

## 2021-02-19 DIAGNOSIS — Z7982 Long term (current) use of aspirin: Secondary | ICD-10-CM | POA: Insufficient documentation

## 2021-02-19 DIAGNOSIS — Z7984 Long term (current) use of oral hypoglycemic drugs: Secondary | ICD-10-CM | POA: Insufficient documentation

## 2021-02-19 DIAGNOSIS — Z955 Presence of coronary angioplasty implant and graft: Secondary | ICD-10-CM | POA: Insufficient documentation

## 2021-02-19 DIAGNOSIS — I10 Essential (primary) hypertension: Secondary | ICD-10-CM

## 2021-02-19 DIAGNOSIS — K219 Gastro-esophageal reflux disease without esophagitis: Secondary | ICD-10-CM | POA: Insufficient documentation

## 2021-02-19 DIAGNOSIS — E785 Hyperlipidemia, unspecified: Secondary | ICD-10-CM | POA: Insufficient documentation

## 2021-02-19 DIAGNOSIS — E1169 Type 2 diabetes mellitus with other specified complication: Secondary | ICD-10-CM

## 2021-02-19 DIAGNOSIS — Z90710 Acquired absence of both cervix and uterus: Secondary | ICD-10-CM

## 2021-02-19 DIAGNOSIS — Z794 Long term (current) use of insulin: Secondary | ICD-10-CM | POA: Insufficient documentation

## 2021-02-19 DIAGNOSIS — Z79899 Other long term (current) drug therapy: Secondary | ICD-10-CM | POA: Insufficient documentation

## 2021-02-19 DIAGNOSIS — I2510 Atherosclerotic heart disease of native coronary artery without angina pectoris: Secondary | ICD-10-CM | POA: Insufficient documentation

## 2021-02-19 DIAGNOSIS — Z87891 Personal history of nicotine dependence: Secondary | ICD-10-CM | POA: Insufficient documentation

## 2021-02-19 DIAGNOSIS — E669 Obesity, unspecified: Secondary | ICD-10-CM

## 2021-02-19 DIAGNOSIS — Z90722 Acquired absence of ovaries, bilateral: Secondary | ICD-10-CM

## 2021-02-19 LAB — ECHOCARDIOGRAM COMPLETE
Area-P 1/2: 2.17 cm2
S' Lateral: 2.9 cm

## 2021-02-19 MED ORDER — LISINOPRIL 20 MG PO TABS: 20.0000 mg | ORAL_TABLET | Freq: Every day | ORAL | 9 refills | Status: DC

## 2021-02-19 MED FILL — LISINOPRIL 20 MG TABS: 20 | 30 days supply | Qty: 30 | Fill #0

## 2021-02-19 NOTE — Progress Notes (Signed)
Gynecologic Oncology Return Clinic Visit  02/19/21  Reason for Visit: post-operative follow-up  Treatment History: Oncology History Overview Note  Pap 12/14/20: adenocarcinoma, HPV negative  HER-2 positive by FISH Her-2 equivocal by [facility]  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 ENDOCERVIX, CURETTAGE:  - Adenocarcinoma.  B. ENDOMETRIUM, BIOPSY:  - Adenocarcinoma.  COMMENT:  The differential diagnosis includes high grade serous carcinoma.  Both  specimens have a similar morphology; high grade serous carcinoma more  commonly arises in the endometrium.  Results reported to Dr. Hale Bogus  on 01/15/2021.  Dr. Saralyn Pilar reviewed the case.    01/16/2021 Tumor Marker   Patient's tumor was tested for the following markers: CA-125 Results of the tumor marker test revealed normal value, 10.7   01/25/2021 Imaging   CT C/A/P: 1. Small volume fluid in the endometrial canal with 7 mm hypoattenuating lesion in the myometrium of the anterior fundus. 2. No definite evidence for metastatic disease in the chest, abdomen, or pelvis. Small lymph nodes are noted along both pelvic sidewalls. Attention on follow-up recommended. 3. 5 mm ground-glass opacity peripheral left upper lobe. This is likely related to infection/inflammation and potentially scar. Attention on surveillance imaging recommended. 4. 5.8 cm lesion posterior lower uterine segment likely a fibroid. Ultrasound exam from 01/28/2010 demonstrated a 5.2 cm lesion in the left aspect of the posterior lower uterine body. 5. Aortic Atherosclerosis (ICD10-I70.0).   01/29/2021 Surgery   TRH/BSO, SLN biopsy left, selective pelvic LND right, omentectomy  Findings: On EUA, 10-12cm enlarged mobile uterus. On intra-abdomina entry, minimal adhesions between the liver and anterior abdomen on the right. Some changes c/w fatty liver on the  left. Omentum, stomach, small and large bowel all grossly normal. Bilateral ovaries normal appearing. Uterus with 5-6cm posterior fibroid, otherwise normal appearing. No mapping to right pelvis. On left, mapping to the level of the superior vessel artery, enlarged lymph node noted (likely sentinel) within the upper aspect of the obturator space. In bilateral pelvic basins, multiple enlarged lymph nodes. On the right, enlarged lymph nodes extended superiorly along the common iliac vessels. At the end of surgery, no obvious abdominal or pelvic evidence of disease.   01/29/2021 Pathology Results   A. SENTINEL LYMPH NODE, LEFT INTERNAL ILIAC, BIOPSY:  - Metastatic carcinoma in (1) of (1) lymph node.   B. SENTINEL LYMPH NODE, LEFT OBTURATOR, BIOPSY:  - Metastatic carcinoma in (1) of (1) lymph node.   C. LYMPH NODES, LEFT PELVIC, DISSECTION:  - Metastatic carcinoma in (1) of (3) lymph nodes.   D. LYMPH NODE, RIGHT EXTERNAL AND COMMON ILIAC, BIOPSY:  - Metastatic carcinoma in (1) of (1) lymph node.   E. UTERUS, CERVIX AND BILATERAL FALLOPIAN TUBES AND OVARIES, TOTAL  HYSTERECTOMY AND BILATERAL SALPINGO-OOPHORECTOMY:  - High grade serous carcinoma of endometrium, with invasion more than  half of the myometrium.  - Tumor invades the stromal connective tissue of the cervix.  - No involvement of uterine serosa or adnexa.  - Lymphovascular invasion is identified.  - See oncology table.   F. OMENTUM, OMENTECTOMY:  - Omentum, negative for carcinoma.   G. LYMPH NODES, RIGHT PELVIC, DISSECTION:  - Two lymph nodes, negative for carcinoma (0/2).   ONCOLOGY TABLE:   UTERUS, CARCINOMA OR CARCINOSARCOMA: Resection   Procedure: Total hysterectomy and bilateral salpingo-oophorectomy,  Omentectomy, Lymph node sampling  Histologic Type: Serous carcinoma  Histologic Grade: High  grade  Myometrial Invasion: > 50%  Uterine Serosa Involvement: Not identified  Cervical Stroma Involvement: Present  Other  Tissue/Organ Involvement: Not identified  Peritoneal/Ascitic Fluid: Negative for carcinoma  Lymphovascular Invasion: Present  Regional Lymph Nodes:       Pelvic Lymph Nodes Examined:            2 Sentinel            6 Non-Sentinel            8 Total       Pelvic Lymph Nodes with Metastasis: 4            Macrometastasis: 3            Micrometastasis: 1            Isolated Tumor Cells: 0            Laterality of Lymph Nodes with Tumor: Right (non-sentinel),  Left (sentinel and non-sentinel)            Extracapsular Extension: Present       Para-Aortic Lymph Nodes Examined: Not applicable        Para-Aortic Lymph Nodes with Metastasis: Not applicable  Distant Metastasis:       Distant Site(s) Involved: Omentum: Not involved  Pathologic Stage Classification (pTNM, AJCC 8th Edition): pT2, pN1a  Ancillary Studies: HER2 will be ordered  Additional Findings: Leiomyomata.  Adenomyosis.  Representative Tumor Block: B1  Comment: Dr. Saralyn Pilar reviewed select slides.  (v4.2.0.1)    01/29/2021 Cancer Staging   Staging form: Corpus Uteri - Carcinoma and Carcinosarcoma, AJCC 8th Edition - Clinical stage from 01/29/2021: FIGO Stage IIIC1 (cT1b, cN1a, cM0) - Signed by Lafonda Mosses, MD on 02/02/2021 Histopathologic type: Mixed cell adenocarcinoma Stage prefix: Initial diagnosis Method of lymph node assessment: Other Histologic grade (G): G3 Histologic grading system: 3 grade system Lymph-vascular invasion (LVI): LVI present/identified, NOS Peritoneal cytology results: Negative Pelvic nodal status: Positive Number of pelvic nodes positive from dissection: 4 Number of pelvic nodes examined during dissection: 8 Para-aortic status: Not assessed Lymph node metastasis: Present Omentectomy performed: Yes Morcellation performed: No   02/19/2021 Echocardiogram    1. Left ventricular ejection fraction, by estimation, is 55 to 60%. The left ventricle has normal function. The left ventricle  demonstrates regional wall motion abnormalities (see scoring diagram/findings for description). There is mild left ventricular  hypertrophy. Left ventricular diastolic parameters are consistent with Grade I diastolic dysfunction (impaired relaxation). There is moderate hypokinesis of the left ventricular, basal septal wall and inferior wall. The average left ventricular global longitudinal strain is -17.9 %. The global longitudinal strain is abnormal.  2. Right ventricular systolic function is normal. The right ventricular size is normal. There is normal pulmonary artery systolic pressure. The estimated right ventricular systolic pressure is 81.8 mmHg.  3. The mitral valve is abnormal. Trivial mitral valve regurgitation.  4. The aortic valve is tricuspid. Aortic valve regurgitation is not visualized.  5. The inferior vena cava is normal in size with greater than 50% respiratory variability, suggesting right atrial pressure of 3 mmHg.   02/26/2021 -  Chemotherapy    Patient is on Treatment Plan: UTERINE CARBOPLATIN AUC 6 / PACLITAXEL Q21D        Interval History: Patient reports doing well since surgery.  She notes continued improvement with each day.  She has had some abdominal and pelvic cramping for which she uses Tylenol and very occasionally tramadol.  She denies any fevers or chills.  She  denies any vaginal bleeding or discharge.  She is tolerating a regular diet without nausea or emesis.  She continues to have a little bit of constipation and is using prune juice to help keep bowel movements regular.  She denies any urinary symptoms.  Past Medical/Surgical History: Past Medical History:  Diagnosis Date  . Anginal pain ([place]) 2016  . Cancer ([place])   . Coronary atherosclerosis of native coronary artery, with stent to LCX in 2006 06/04/2013  . Diabetes mellitus without complication ([place])   . GERD (gastroesophageal reflux disease)   . Heart attack ([employer]) 2006  . Hyperlipidemia LDL goal < 70  06/07/2013  . Hypertension   . Obesity   . S/P coronary artery stent placement to RCA with PROMUS DES, 06/07/13, residual disease on OM1, OM2 and rPDA 06/07/2013    Past Surgical History:  Procedure Laterality Date  . CARDIAC CATHETERIZATION N/A 08/22/2015   Procedure: Left Heart Cath and Coronary Angiography;  Surgeon: Leonie Man, MD;  Location: [place];  Service: Cardiovascular;  Laterality: N/A;  . CARDIAC CATHETERIZATION N/A 08/22/2015   Procedure: Coronary Stent Intervention;  Surgeon: Leonie Man, MD;  Location: [place] [facility];  Service: Cardiovascular;  Laterality: N/A;  . CARDIAC CATHETERIZATION N/A 08/22/2015   Procedure: Left Heart Cath and Coronary Angiography;  Surgeon: Leonie Man, MD;  Location: [place] [facility];  Service: Cardiovascular;  Laterality: N/A;  . CORONARY STENT PLACEMENT  2006   [facility]X [facility]  . LEFT HEART CATH AND CORONARY ANGIOGRAPHY N/A 07/21/2018   Procedure: LEFT HEART CATH AND CORONARY ANGIOGRAPHY;  Surgeon: Troy Sine, MD;  Location: [place] [facility];  Service: Cardiovascular;  Laterality: N/A;  . LEFT HEART CATHETERIZATION WITH CORONARY ANGIOGRAM N/A 06/07/2013   Procedure: LEFT HEART CATHETERIZATION WITH CORONARY ANGIOGRAM;  Surgeon: Sanda Klein, MD;  Location: [facility];  Service: Cardiovascular;  Laterality: N/A;  . LYMPH NODE DISSECTION N/A 01/29/2021   Procedure: SELECTIVE LYMPH NODE DISSECTION;  Surgeon: Lafonda Mosses, MD;  Location: [facility];  Service: Gynecology;  Laterality: N/A;  . ROBOTIC ASSISTED TOTAL HYSTERECTOMY WITH BILATERAL SALPINGO OOPHERECTOMY Bilateral 01/29/2021   Procedure: XI ROBOTIC ASSISTED TOTAL HYSTERECTOMY WITH BILATERAL SALPINGO OOPHORECTOMY,;  Surgeon: Lafonda Mosses, MD;  Location: [facility];  Service: Gynecology;  Laterality: Bilateral;  . SENTINEL NODE BIOPSY N/A 01/29/2021   Procedure: SENTINEL NODE BIOPSY;  Surgeon: Lafonda Mosses, MD;  Location: [facility];  Service:  Gynecology;  Laterality: N/A;    Family History  Problem Relation Age of Onset  . Diabetes Mother   . Hypertension Mother   . Diabetes Sister   . Diabetes Brother   . Hypertension Brother   . Colon cancer Father   . Breast cancer Neg Hx   . Ovarian cancer Neg Hx   . Endometrial cancer Neg Hx   . Prostate cancer Neg Hx   . Pancreatic cancer Neg Hx     Social History   Socioeconomic History  . Marital status: Divorced    Spouse name: Not on file  . Number of children: 1  . Years of education: Not on file  . Highest education level: Not on file  Occupational History  . Occupation: Scientist, water quality  Tobacco Use  . Smoking status: Former Smoker    Years: 25.00    Types: Cigars    Quit date: 06/23/2004    Years since quitting: 16.6  . Smokeless tobacco: Never Used  .  Tobacco comment: "a few black and milds a day" CigrettesX 30 years  Vaping Use  . Vaping Use: Never used  Substance and Sexual Activity  . Alcohol use: Yes    Comment: occ  . Drug use: No  . Sexual activity: Not Currently  Other Topics Concern  . Not on file  Social History Narrative  . Not on file   Social Determinants of Health   Financial Resource Strain: Not on file  Food Insecurity: No Food Insecurity  . Worried About Charity fundraiser in the Last Year: Never true  . Ran Out of Food in the Last Year: Never true  Transportation Needs: No Transportation Needs  . Lack of Transportation (Medical): No  . Lack of Transportation (Non-Medical): No  Physical Activity: Not on file  Stress: Not on file  Social Connections: Not on file    Current Medications:  Current Outpatient Medications:  .  acetaminophen (TYLENOL) 500 MG tablet, Take 1 tablet (500 mg total) by mouth every 6 (six) hours as needed., Disp: 30 tablet, Rfl: 0 .  atorvastatin (LIPITOR) 80 MG tablet, TAKE 1 TABLET (80 MG TOTAL) BY MOUTH AT BEDTIME., Disp: 30 tablet, Rfl: 6 .  bismuth subsalicylate (PEPTO BISMOL) 262 MG/15ML suspension, Take 30  mLs by mouth every 6 (six) hours as needed for indigestion or diarrhea or loose stools., Disp: , Rfl:  .  dexamethasone (DECADRON) 4 MG tablet, Take 2 tabs at the night before chemotherapy, every 3 weeks, by mouth x 6 cycles, Disp: 12 tablet, Rfl: 6 .  Dulaglutide (TRULICITY) 1.5 FV/4.9SW SOPN, Inject 1.5 mg into the skin once a week. (Patient taking differently: Inject 1.5 mg into the skin every Friday.), Disp: 4 pen, Rfl: 3 .  furosemide (LASIX) 20 MG tablet, TAKE 2 TABLETS (40 MG TOTAL) BY MOUTH DAILY. (Patient taking differently: Take 40 mg by mouth daily.), Disp: 60 tablet, Rfl: 2 .  glimepiride (AMARYL) 4 MG tablet, TAKE 2 TABLETS (8 MG TOTAL) BY MOUTH DAILY WITH BREAKFAST., Disp: 60 tablet, Rfl: 6 .  Insulin Pen Needle (TRUEPLUS PEN NEEDLES) 32G X 4 MM MISC, Use as directed to inject victoza once daily, Disp: 100 each, Rfl: 3 .  isosorbide mononitrate (IMDUR) 30 MG 24 hr tablet, TAKE 2 TABLETS (60 MG TOTAL) BY MOUTH DAILY., Disp: 60 tablet, Rfl: 1 .  lidocaine-prilocaine (EMLA) cream, Apply to affected area once, Disp: 30 g, Rfl: 3 .  metFORMIN (GLUCOPHAGE) 500 MG tablet, TAKE 2 TABLETS (1,000 MG TOTAL) BY MOUTH 2 (TWO) TIMES DAILY WITH A MEAL., Disp: 120 tablet, Rfl: 1 .  metoprolol tartrate (LOPRESSOR) 100 MG tablet, Take 1 tablet (100 mg total) by mouth 2 (two) times daily., Disp: 60 tablet, Rfl: 6 .  nitroGLYCERIN (NITROSTAT) 0.4 MG SL tablet, Place 1 tablet (0.4 mg total) under the tongue every 5 (five) minutes as needed for chest pain., Disp: 25 tablet, Rfl: 2 .  ondansetron (ZOFRAN) 8 MG tablet, Take 1 tablet (8 mg total) by mouth every 8 (eight) hours as needed., Disp: 30 tablet, Rfl: 1 .  pantoprazole (PROTONIX) 40 MG tablet, TAKE 1 TABLET (40 MG TOTAL) BY MOUTH DAILY., Disp: 30 tablet, Rfl: 6 .  prochlorperazine (COMPAZINE) 10 MG tablet, Take 1 tablet (10 mg total) by mouth every 6 (six) hours as needed (Nausea or vomiting)., Disp: 30 tablet, Rfl: 1 .  senna-docusate (SENOKOT-S) 8.6-50  MG tablet, Take 2 tablets by mouth at bedtime. For AFTER surgery, do not take if having diarrhea, Disp:  30 tablet, Rfl: 0 .  aspirin EC 81 MG EC tablet, Take 1 tablet (81 mg total) by mouth daily. (Patient not taking: Reported on 02/16/2021), Disp: , Rfl:  .  traMADol (ULTRAM) 50 MG tablet, Take 1 tablet (50 mg total) by mouth every 6 (six) hours as needed for severe pain. For AFTER surgery only, do not take and drive, Disp: 10 tablet, Rfl: 0  Review of Systems: + Fatigue, abdominal pain, urinary frequency, pelvic pain, joint pain, difficulty walking, headaches, anxiety, depression. Denies appetite changes, fevers, chills, unexplained weight changes. Denies hearing loss, neck lumps or masses, mouth sores, ringing in ears or voice changes. Denies cough or wheezing.  Denies shortness of breath. Denies chest pain or palpitations. Denies leg swelling. Denies abdominal distention, blood in stools, constipation, diarrhea, nausea, vomiting, or early satiety. Denies pain with intercourse, dysuria, hematuria or incontinence. Denies hot flashes, vaginal bleeding or vaginal discharge.   Denies back pain or muscle pain/cramps. Denies itching, rash, or wounds. Denies dizziness, numbness or seizures. Denies swollen lymph nodes or glands, denies easy bruising or bleeding. Denies confusion or decreased concentration.  Physical Exam: BP (!) 154/81 (BP Location: Right Arm, Patient Position: Sitting)   Pulse 72   Temp (!) 96.9 F (36.1 C) (Tympanic)   Resp 20   Wt 218 lb (98.9 kg)   SpO2 100% Comment: RA  BMI 39.87 kg/m  General: Alert, oriented, no acute distress. HEENT: Normocephalic, atraumatic, sclera anicteric. Chest: Unlabored breathing on room air. Abdomen: Obese, soft, nontender.  Normoactive bowel sounds.  No masses or hepatosplenomegaly appreciated.  Well-healing incisions. Extremities: Grossly normal range of motion.  Warm, well perfused.  No edema bilaterally. GU: Normal appearing external  genitalia without erythema, excoriation, or lesions.  Speculum exam reveals cuff intact, no bleeding or discharge, suture still visible.  Bimanual exam reveals cuff intact, no fluctuance or tenderness with palpation.    Laboratory & Radiologic Studies: None new  Assessment & Plan: [name] is a 65 y.o. woman with Stage IIIC1 serous uterine carcinoma who presents for postoperative follow-up and discussion of treatment plan.  Patient presents today overall doing well from a postoperative standpoint.  She is meeting milestones and healing quite well.  We discussed continued activity restrictions as well as postoperative expectations.  Patient was given a copy of her pathology report.  I discussed this again in detail with her as well as her daughter who is here today.  I answered questions about treatment as well as overall prognosis.  Given her retrospective data in the setting of stage III and IV disease, her 5-year overall survival is likely near 30%.  Patient has seen Dr. Alvy Bimler for counseling to start systemic chemotherapy with possible addition of trastuzumab depending on her cardiac evaluation.  Additionally, given high lymph node burden and risk factors, discussion at tumor board was in favor of pelvic radiation therapy.  Patient will be referred to Dr. Sondra Come for evaluation and will see her toleration of chemotherapy before deciding about radiation.  32 minutes of total time was spent for this patient encounter, including preparation, face-to-face counseling with the patient and coordination of care, and documentation of the encounter.  Elizabeth Pinch, MD  Division of Gynecologic Oncology  Department of Obstetrics and Gynecology  [facility]

## 2021-02-19 NOTE — Assessment & Plan Note (Signed)
Clinically, she has no clinical signs of congestive heart failure Echocardiogram showed preserved ejection fraction but there are areas of hypokinesis related to prior history of MI For now, we are not planning to add Herceptin to future treatment As above, I recommend aggressive risk factor modification

## 2021-02-19 NOTE — Progress Notes (Signed)
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[place]) Overall, she is healing well from surgery She is scheduled for port placement, chemo education class, radiation oncology consultation and chemotherapy to start next week I reviewed results of echocardiogram with the patient At this point in time, my recommendation would be to proceed with adjuvant treatment with carboplatin and paclitaxel We discussed the importance of risk factor modification while on treatment I will touch base with her again a week after chemotherapy for toxicity review  Essential hypertension She has poorly controlled hypertension I recommend addition of lisinopril I recommend her to continue to check her blood pressure twice a day and we will reassess her blood pressure control in 2 weeks  Diabetes mellitus type 2 in obese [facility]) She is aware of the importance of risk factor modification and lifestyle changes while on treatment She will continue close monitoring of blood sugar  Chronic diastolic CHF (congestive heart failure) ([place]) Clinically, she has no clinical signs of congestive heart failure Echocardiogram showed preserved ejection fraction but there are areas of hypokinesis related to prior history of MI For now, we are not planning to add Herceptin to future treatment As above, I recommend aggressive risk factor modification   No orders of the defined types were placed in this encounter.   All questions were answered. The patient knows to call the clinic with any problems, questions or concerns. The total time spent in the appointment was 20 minutes encounter with patients including review of chart and various tests results, discussions about plan of care and coordination of care plan   Heath Lark,  MD 02/19/2021 3:09 PM  INTERVAL HISTORY: Please see below for problem oriented charting. 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 ENDOCERVIX, CURETTAGE:  - Adenocarcinoma.  B. ENDOMETRIUM, BIOPSY:  - Adenocarcinoma.  COMMENT:  The differential diagnosis includes high grade serous carcinoma.  Both  specimens have a similar morphology; high grade serous carcinoma more  commonly arises in the endometrium.  Results reported to Dr. Hale Bogus  on 01/15/2021.  Dr. Saralyn Pilar reviewed the case.    01/16/2021 Tumor Marker   Patient's tumor was tested for the following markers: CA-125 Results of the tumor marker test revealed normal value, 10.7   01/25/2021 Imaging   CT C/A/P: 1. Small volume fluid in the endometrial canal with 7 mm hypoattenuating lesion in the myometrium of the anterior fundus. 2. No definite evidence for metastatic disease in the chest, abdomen, or pelvis. Small lymph nodes are noted along both pelvic sidewalls. Attention on follow-up recommended. 3. 5 mm ground-glass opacity peripheral left upper lobe. This is likely related to infection/inflammation and potentially scar. Attention on surveillance imaging recommended. 4. 5.8 cm lesion posterior lower uterine segment likely a fibroid. Ultrasound exam from 01/28/2010 demonstrated  a 5.2 cm lesion in the left aspect of the posterior lower uterine body. 5. Aortic Atherosclerosis  (ICD10-I70.0).   01/29/2021 Surgery   TRH/BSO, SLN biopsy left, selective pelvic LND right, omentectomy  Findings: On EUA, 10-12cm enlarged mobile uterus. On intra-abdomina entry, minimal adhesions between the liver and anterior abdomen on the right. Some changes c/w fatty liver on the left. Omentum, stomach, small and large bowel all grossly normal. Bilateral ovaries normal appearing. Uterus with 5-6cm posterior fibroid, otherwise normal appearing. No mapping to right pelvis. On left, mapping to the level of the superior vessel artery, enlarged lymph node noted (likely sentinel) within the upper aspect of the obturator space. In bilateral pelvic basins, multiple enlarged lymph nodes. On the right, enlarged lymph nodes extended superiorly along the common iliac vessels. At the end of surgery, no obvious abdominal or pelvic evidence of disease.   01/29/2021 Pathology Results   A. SENTINEL LYMPH NODE, LEFT INTERNAL ILIAC, BIOPSY:  - Metastatic carcinoma in (1) of (1) lymph node.   B. SENTINEL LYMPH NODE, LEFT OBTURATOR, BIOPSY:  - Metastatic carcinoma in (1) of (1) lymph node.   C. LYMPH NODES, LEFT PELVIC, DISSECTION:  - Metastatic carcinoma in (1) of (3) lymph nodes.   D. LYMPH NODE, RIGHT EXTERNAL AND COMMON ILIAC, BIOPSY:  - Metastatic carcinoma in (1) of (1) lymph node.   E. UTERUS, CERVIX AND BILATERAL FALLOPIAN TUBES AND OVARIES, TOTAL  HYSTERECTOMY AND BILATERAL SALPINGO-OOPHORECTOMY:  - High grade serous carcinoma of endometrium, with invasion more than  half of the myometrium.  - Tumor invades the stromal connective tissue of the cervix.  - No involvement of uterine serosa or adnexa.  - Lymphovascular invasion is identified.  - See oncology table.   F. OMENTUM, OMENTECTOMY:  - Omentum, negative for carcinoma.   G. LYMPH NODES, RIGHT PELVIC, DISSECTION:  - Two lymph nodes, negative for carcinoma (0/2).   ONCOLOGY TABLE:   UTERUS, CARCINOMA OR CARCINOSARCOMA: Resection    Procedure: Total hysterectomy and bilateral salpingo-oophorectomy,  Omentectomy, Lymph node sampling  Histologic Type: Serous carcinoma  Histologic Grade: High grade  Myometrial Invasion: > 50%  Uterine Serosa Involvement: Not identified  Cervical Stroma Involvement: Present  Other Tissue/Organ Involvement: Not identified  Peritoneal/Ascitic Fluid: Negative for carcinoma  Lymphovascular Invasion: Present  Regional Lymph Nodes:       Pelvic Lymph Nodes Examined:            2 Sentinel            6 Non-Sentinel            8 Total       Pelvic Lymph Nodes with Metastasis: 4            Macrometastasis: 3            Micrometastasis: 1            Isolated Tumor Cells: 0            Laterality of Lymph Nodes with Tumor: Right (non-sentinel),  Left (sentinel and non-sentinel)            Extracapsular Extension: Present       Para-Aortic Lymph Nodes Examined: Not applicable        Para-Aortic Lymph Nodes with Metastasis: Not applicable  Distant Metastasis:       Distant Site(s) Involved: Omentum: Not involved  Pathologic Stage Classification (pTNM, AJCC 8th Edition): pT2, pN1a  Ancillary Studies: HER2 will be ordered  Additional Findings: Leiomyomata.  Adenomyosis.  Representative Tumor Block: B1  Comment: Dr. Saralyn Pilar reviewed select slides.  (v4.2.0.1)    01/29/2021 Cancer Staging   Staging form: Corpus Uteri - Carcinoma and Carcinosarcoma, AJCC 8th Edition - Clinical stage from 01/29/2021: FIGO Stage IIIC1 (cT1b, cN1a, cM0) - Signed by Lafonda Mosses, MD on 02/02/2021 Histopathologic type: Mixed cell adenocarcinoma Stage prefix: Initial diagnosis Method of lymph node assessment: Other Histologic grade (G): G3 Histologic grading system: 3 grade system Lymph-vascular invasion (LVI): LVI present/identified, NOS Peritoneal cytology results: Negative Pelvic nodal status: Positive Number of pelvic nodes positive from dissection: 4 Number of pelvic nodes examined during  dissection: 8 Para-aortic status: Not assessed Lymph node metastasis: Present Omentectomy performed: Yes Morcellation performed: No   02/19/2021 Echocardiogram    1. Left ventricular ejection fraction, by estimation, is 55 to 60%. The left ventricle has normal function. The left ventricle demonstrates regional wall motion abnormalities (see scoring diagram/findings for description). There is mild left ventricular  hypertrophy. Left ventricular diastolic parameters are consistent with Grade I diastolic dysfunction (impaired relaxation). There is moderate hypokinesis of the left ventricular, basal septal wall and inferior wall. The average left ventricular global longitudinal strain is -17.9 %. The global longitudinal strain is abnormal.  2. Right ventricular systolic function is normal. The right ventricular size is normal. There is normal pulmonary artery systolic pressure. The estimated right ventricular systolic pressure is 96.0 mmHg.  3. The mitral valve is abnormal. Trivial mitral valve regurgitation.  4. The aortic valve is tricuspid. Aortic valve regurgitation is not visualized.  5. The inferior vena cava is normal in size with greater than 50% respiratory variability, suggesting right atrial pressure of 3 mmHg.   02/26/2021 -  Chemotherapy    Patient is on Treatment Plan: UTERINE CARBOPLATIN AUC 6 / PACLITAXEL Q21D        REVIEW OF SYSTEMS:   Constitutional: Denies fevers, chills or abnormal weight loss Eyes: Denies blurriness of vision Ears, nose, mouth, throat, and face: Denies mucositis or sore throat Respiratory: Denies cough, dyspnea or wheezes Cardiovascular: Denies palpitation, chest discomfort or lower extremity swelling Gastrointestinal:  Denies nausea, heartburn or change in bowel habits Skin: Denies abnormal skin rashes Lymphatics: Denies new lymphadenopathy or easy bruising Neurological:Denies numbness, tingling or new weaknesses Behavioral/Psych: Mood is stable, no new  changes  All other systems were reviewed with the patient and are negative.  I have reviewed the past medical history, past surgical history, social history and family history with the patient and they are unchanged from previous note.  ALLERGIES:  has No Known Allergies.  MEDICATIONS:  Current Outpatient Medications  Medication Sig Dispense Refill  . acetaminophen (TYLENOL) 500 MG tablet Take 1 tablet (500 mg total) by mouth every 6 (six) hours as needed. 30 tablet 0  . aspirin EC 81 MG EC tablet Take 1 tablet (81 mg total) by mouth daily. (Patient not taking: Reported on 02/16/2021)    . atorvastatin (LIPITOR) 80 MG tablet TAKE 1 TABLET (80 MG TOTAL) BY MOUTH AT BEDTIME. 30 tablet 6  . bismuth subsalicylate (PEPTO BISMOL) 262 MG/15ML suspension Take 30 mLs by mouth every 6 (six) hours as needed for indigestion or diarrhea or loose stools.    Marland Kitchen dexamethasone (DECADRON) 4 MG tablet Take 2 tabs at the night before chemotherapy, every 3 weeks, by mouth x 6 cycles 12 tablet 6  . Dulaglutide (TRULICITY) 1.5 AV/4.0JW SOPN Inject 1.5 mg into the skin once a week. (Patient taking differently: Inject 1.5 mg into the skin every  Friday.) 4 pen 3  . furosemide (LASIX) 20 MG tablet TAKE 2 TABLETS (40 MG TOTAL) BY MOUTH DAILY. (Patient taking differently: Take 40 mg by mouth daily.) 60 tablet 2  . glimepiride (AMARYL) 4 MG tablet TAKE 2 TABLETS (8 MG TOTAL) BY MOUTH DAILY WITH BREAKFAST. 60 tablet 6  . Insulin Pen Needle (TRUEPLUS PEN NEEDLES) 32G X 4 MM MISC Use as directed to inject victoza once daily 100 each 3  . isosorbide mononitrate (IMDUR) 30 MG 24 hr tablet TAKE 2 TABLETS (60 MG TOTAL) BY MOUTH DAILY. 60 tablet 1  . lidocaine-prilocaine (EMLA) cream Apply to affected area once 30 g 3  . lisinopril (PRINIVIL) 20 MG tablet Take 1 tablet (20 mg total) by mouth daily. 30 tablet 9  . metFORMIN (GLUCOPHAGE) 500 MG tablet TAKE 2 TABLETS (1,000 MG TOTAL) BY MOUTH 2 (TWO) TIMES DAILY WITH A MEAL. 120 tablet 1   . metoprolol tartrate (LOPRESSOR) 100 MG tablet Take 1 tablet (100 mg total) by mouth 2 (two) times daily. 60 tablet 6  . nitroGLYCERIN (NITROSTAT) 0.4 MG SL tablet Place 1 tablet (0.4 mg total) under the tongue every 5 (five) minutes as needed for chest pain. 25 tablet 2  . ondansetron (ZOFRAN) 8 MG tablet Take 1 tablet (8 mg total) by mouth every 8 (eight) hours as needed. 30 tablet 1  . pantoprazole (PROTONIX) 40 MG tablet TAKE 1 TABLET (40 MG TOTAL) BY MOUTH DAILY. 30 tablet 6  . prochlorperazine (COMPAZINE) 10 MG tablet Take 1 tablet (10 mg total) by mouth every 6 (six) hours as needed (Nausea or vomiting). 30 tablet 1  . senna-docusate (SENOKOT-S) 8.6-50 MG tablet Take 2 tablets by mouth at bedtime. For AFTER surgery, do not take if having diarrhea 30 tablet 0  . traMADol (ULTRAM) 50 MG tablet Take 1 tablet (50 mg total) by mouth every 6 (six) hours as needed for severe pain. For AFTER surgery only, do not take and drive 10 tablet 0   No current facility-administered medications for this visit.    PHYSICAL EXAMINATION: ECOG PERFORMANCE STATUS: 1 - Symptomatic but completely ambulatory Blood pressure 154/81 Heart rate 72 Respiration rate 20 Temperature 96.9 GENERAL:alert, no distress and comfortable NEURO: alert & oriented x 3 with fluent speech, no focal motor/sensory deficits  LABORATORY DATA:  I have reviewed the data as listed    Component Value Date/Time   NA 141 01/24/2021 1147   NA 143 10/18/2020 1028   K 3.5 01/24/2021 1147   CL 104 01/24/2021 1147   CO2 24 01/24/2021 1147   GLUCOSE 223 (H) 01/24/2021 1147   BUN 20 01/24/2021 1147   BUN 13 10/18/2020 1028   CREATININE 1.11 (H) 01/24/2021 1147   CREATININE 0.79 02/11/2017 0943   CALCIUM 9.4 01/24/2021 1147   PROT 7.3 01/16/2021 1315   PROT 6.7 10/18/2020 1028   ALBUMIN 3.5 01/16/2021 1315   ALBUMIN 4.1 10/18/2020 1028   AST 17 01/16/2021 1315   ALT 16 01/16/2021 1315   ALKPHOS 100 01/16/2021 1315   BILITOT 0.4  01/16/2021 1315   BILITOT 0.2 10/18/2020 1028   GFRNONAA 56 (L) 01/24/2021 1147   GFRNONAA 83 02/11/2017 0943   GFRAA 70 10/18/2020 1028   GFRAA >89 02/11/2017 0943    No results found for: SPEP, UPEP  Lab Results  Component Value Date   WBC 7.2 01/24/2021   NEUTROABS 3.4 12/31/2019   HGB 13.2 01/24/2021   HCT 41.2 01/24/2021   MCV 92.8 01/24/2021  PLT 319 01/24/2021      Chemistry      Component Value Date/Time   NA 141 01/24/2021 1147   NA 143 10/18/2020 1028   K 3.5 01/24/2021 1147   CL 104 01/24/2021 1147   CO2 24 01/24/2021 1147   BUN 20 01/24/2021 1147   BUN 13 10/18/2020 1028   CREATININE 1.11 (H) 01/24/2021 1147   CREATININE 0.79 02/11/2017 0943      Component Value Date/Time   CALCIUM 9.4 01/24/2021 1147   ALKPHOS 100 01/16/2021 1315   AST 17 01/16/2021 1315   ALT 16 01/16/2021 1315   BILITOT 0.4 01/16/2021 1315   BILITOT 0.2 10/18/2020 1028       RADIOGRAPHIC STUDIES: I have personally reviewed the radiological images as listed and agreed with the findings in the report. CT CHEST ABDOMEN PELVIS W CONTRAST  Result Date: 01/26/2021 CLINICAL DATA:  Endometrial cancer.  Staging. EXAM: CT CHEST, ABDOMEN, AND PELVIS WITH CONTRAST TECHNIQUE: Multidetector CT imaging of the chest, abdomen and pelvis was performed following the standard protocol during bolus administration of intravenous contrast. CONTRAST:  121m OMNIPAQUE IOHEXOL 300 MG/ML  SOLN COMPARISON:  None. FINDINGS: CT CHEST FINDINGS Cardiovascular: The heart size is normal. No substantial pericardial effusion. Coronary artery calcification is evident. Atherosclerotic calcification is noted in the wall of the thoracic aorta. Mediastinum/Nodes: No mediastinal lymphadenopathy. There is no hilar lymphadenopathy. The esophagus has normal imaging features. There is no axillary lymphadenopathy. Lungs/Pleura: 5 mm ground-glass opacity identified peripheral left upper lobe on image 52/5. No overtly suspicious  nodule or mass. No focal airspace consolidation. Insert no effusions Musculoskeletal: No worrisome lytic or sclerotic osseous abnormality. Degenerative changes noted right sternoclavicular joint. CT ABDOMEN PELVIS FINDINGS Hepatobiliary: No suspicious focal abnormality within the liver parenchyma. There is no evidence for gallstones, gallbladder wall thickening, or pericholecystic fluid. No intrahepatic or extrahepatic biliary dilation. Pancreas: No focal mass lesion. No dilatation of the main duct. No intraparenchymal cyst. No peripancreatic edema. Spleen: Capsular retraction overlying focal region of hypoperfusion, potentially from prior infarct. Adrenals/Urinary Tract: No adrenal nodule or mass. Kidneys unremarkable. No evidence for hydroureter. The urinary bladder appears normal for the degree of distention. Stomach/Bowel: Stomach is distended with food and contrast material. Duodenum is normally positioned as is the ligament of Treitz. No small bowel wall thickening. No small bowel dilatation. The terminal ileum is normal. The appendix is normal. No gross colonic mass. No colonic wall thickening. Diverticular changes are noted in the left colon without evidence of diverticulitis. Vascular/Lymphatic: There is abdominal aortic atherosclerosis without aneurysm. There is no gastrohepatic or hepatoduodenal ligament lymphadenopathy. No retroperitoneal or mesenteric lymphadenopathy. No pelvic sidewall lymphadenopathy by CT size criteria although small lymph nodes are seen along both pelvic sidewalls including 8 mm short axis right external iliac node on 96/3 and 8 mm short axis left external iliac node on 100/3. Reproductive: 5.8 cm lesion posterior lower uterine segment may be a fibroid. There is small volume fluid in the endometrial canal with 7 mm hypoattenuating lesion in the myometrium of the anterior fundus. Heterogeneous enhancement is seen around the endometrial canal, nonspecific. There is no adnexal mass.  Other: No intraperitoneal free fluid. Musculoskeletal: No worrisome lytic or sclerotic osseous abnormality. IMPRESSION: 1. Small volume fluid in the endometrial canal with 7 mm hypoattenuating lesion in the myometrium of the anterior fundus. 2. No definite evidence for metastatic disease in the chest, abdomen, or pelvis. Small lymph nodes are noted along both pelvic sidewalls. Attention on follow-up recommended.  3. 5 mm ground-glass opacity peripheral left upper lobe. This is likely related to infection/inflammation and potentially scar. Attention on surveillance imaging recommended. 4. 5.8 cm lesion posterior lower uterine segment likely a fibroid. Ultrasound exam from 01/28/2010 demonstrated a 5.2 cm lesion in the left aspect of the posterior lower uterine body. 5. Aortic Atherosclerosis (ICD10-I70.0). Electronically Signed   By: Misty Stanley M.D.   On: 01/26/2021 10:51   ECHOCARDIOGRAM COMPLETE  Result Date: 02/19/2021    ECHOCARDIOGRAM REPORT   Patient Name:   JUSTUS DUERR Date of Exam: 02/19/2021 Medical Rec #:  [id number]     Height:       62.0 in Accession #:    [id number]    Weight:       217.2 lb Date of Birth:  Sep 14, 1956    BSA:          1.980 m Patient Age:    39 years      BP:           161/72 mmHg Patient Gender: F             HR:           67 bpm. Exam Location:  Outpatient Procedure: 2D Echo Indications:    Chemo Z09  History:        Patient has prior history of Echocardiogram examinations, most                 recent 06/19/2019. Risk Factors:Hypertension, Diabetes and                 Dyslipidemia.  Sonographer:    Mikki Santee RDCS (AE) Referring Phys: [phone] NI Daniels  1. Left ventricular ejection fraction, by estimation, is 55 to 60%. The left ventricle has normal function. The left ventricle demonstrates regional wall motion abnormalities (see scoring diagram/findings for description). There is mild left ventricular  hypertrophy. Left ventricular diastolic parameters are  consistent with Grade I diastolic dysfunction (impaired relaxation). There is moderate hypokinesis of the left ventricular, basal septal wall and inferior wall. The average left ventricular global longitudinal strain is -17.9 %. The global longitudinal strain is abnormal.  2. Right ventricular systolic function is normal. The right ventricular size is normal. There is normal pulmonary artery systolic pressure. The estimated right ventricular systolic pressure is 99.2 mmHg.  3. The mitral valve is abnormal. Trivial mitral valve regurgitation.  4. The aortic valve is tricuspid. Aortic valve regurgitation is not visualized.  5. The inferior vena cava is normal in size with greater than 50% respiratory variability, suggesting right atrial pressure of 3 mmHg. Comparison(s): Changes from prior study are noted. 06/19/2019: LVEF >65%, mild LVH, mild biatrial enlargement, RVSP 46 mmHg. FINDINGS  Left Ventricle: Left ventricular ejection fraction, by estimation, is 55 to 60%. The left ventricle has normal function. The left ventricle demonstrates regional wall motion abnormalities. Moderate hypokinesis of the left ventricular, basal septal wall and inferior wall. The average left ventricular global longitudinal strain is -17.9 %. The global longitudinal strain is abnormal. The left ventricular internal cavity size was normal in size. There is mild left ventricular hypertrophy. Left ventricular diastolic parameters are consistent with Grade I diastolic dysfunction (impaired relaxation). Indeterminate filling pressures. Right Ventricle: The right ventricular size is normal. No increase in right ventricular wall thickness. Right ventricular systolic function is normal. There is normal pulmonary artery systolic pressure. The tricuspid regurgitant velocity is 2.16 m/s, and  with an assumed right atrial pressure of 3  mmHg, the estimated right ventricular systolic pressure is 62.8 mmHg. Left Atrium: Left atrial size was normal in  size. Right Atrium: Right atrial size was normal in size. Pericardium: There is no evidence of pericardial effusion. Mitral Valve: The mitral valve is abnormal. Mild mitral annular calcification. Trivial mitral valve regurgitation. Tricuspid Valve: The tricuspid valve is grossly normal. Tricuspid valve regurgitation is trivial. Aortic Valve: The aortic valve is tricuspid. Aortic valve regurgitation is not visualized. Pulmonic Valve: The pulmonic valve was grossly normal. Pulmonic valve regurgitation is not visualized. Aorta: The aortic root and ascending aorta are structurally normal, with no evidence of dilitation. Venous: The inferior vena cava is normal in size with greater than 50% respiratory variability, suggesting right atrial pressure of 3 mmHg. IAS/Shunts: No atrial level shunt detected by color flow Doppler.  LEFT VENTRICLE PLAX 2D LVIDd:         4.20 cm  Diastology LVIDs:         2.90 cm  LV e' medial:    4.90 cm/s LV PW:         1.10 cm  LV E/e' medial:  13.5 LV IVS:        1.00 cm  LV e' lateral:   5.66 cm/s LVOT diam:     2.20 cm  LV E/e' lateral: 11.7 LV SV:         70 LV SV Index:   35       2D Longitudinal Strain LVOT Area:     3.80 cm 2D Strain GLS Avg:     -17.9 %  RIGHT VENTRICLE RV S prime:     11.40 cm/s TAPSE (M-mode): 2.0 cm LEFT ATRIUM             Index       RIGHT ATRIUM           Index LA diam:        3.70 cm 1.87 cm/m  RA Area:     12.80 cm LA Vol (A2C):   56.5 ml 28.54 ml/m RA Volume:   25.30 ml  12.78 ml/m LA Vol (A4C):   54.8 ml 27.68 ml/m LA Biplane Vol: 59.6 ml 30.10 ml/m  AORTIC VALVE LVOT Vmax:   85.80 cm/s LVOT Vmean:  55.900 cm/s LVOT VTI:    0.184 m  AORTA Ao Root diam: 2.80 cm MITRAL VALVE               TRICUSPID VALVE MV Area (PHT): 2.17 cm    TR Peak grad:   18.7 mmHg MV Decel Time: 349 msec    TR Vmax:        216.00 cm/s MV E velocity: 66.10 cm/s MV A velocity: 94.30 cm/s  SHUNTS MV E/A ratio:  0.70        Systemic VTI:  0.18 m                            Systemic  Diam: 2.20 cm Lyman Bishop MD Electronically signed by Lyman Bishop MD Signature Date/Time: 02/19/2021/11:54:28 AM    Final

## 2021-02-19 NOTE — Assessment & Plan Note (Signed)
She has poorly controlled hypertension I recommend addition of lisinopril I recommend her to continue to check her blood pressure twice a day and we will reassess her blood pressure control in 2 weeks

## 2021-02-19 NOTE — Progress Notes (Signed)
  Echocardiogram 2D Echocardiogram has been performed.  Jennette Dubin 02/19/2021, 9:55 AM

## 2021-02-19 NOTE — Assessment & Plan Note (Signed)
Overall, she is healing well from surgery She is scheduled for port placement, chemo education class, radiation oncology consultation and chemotherapy to start next week I reviewed results of echocardiogram with the patient At this point in time, my recommendation would be to proceed with adjuvant treatment with carboplatin and paclitaxel We discussed the importance of risk factor modification while on treatment I will touch base with her again a week after chemotherapy for toxicity review

## 2021-02-19 NOTE — Assessment & Plan Note (Signed)
She is aware of the importance of risk factor modification and lifestyle changes while on treatment She will continue close monitoring of blood sugar

## 2021-02-19 NOTE — Patient Instructions (Signed)
You are healing very well from surgery. I will see you once you have completed treatment with Dr. Alvy Bimler. Please don't hesitate to reach out if you need anything before your next visit with me.

## 2021-02-20 ENCOUNTER — Other Ambulatory Visit: Payer: Self-pay

## 2021-02-20 ENCOUNTER — Ambulatory Visit (HOSPITAL_COMMUNITY)
Admission: RE | Admit: 2021-02-20 | Discharge: 2021-02-20 | Disposition: A | Discharge status: Home / Self Care | Payer: Self-pay | Source: Ambulatory Visit | Attending: Hematology and Oncology | Admitting: Hematology and Oncology

## 2021-02-20 ENCOUNTER — Encounter (HOSPITAL_COMMUNITY): Payer: Self-pay

## 2021-02-20 DIAGNOSIS — C541 Malignant neoplasm of endometrium: Secondary | ICD-10-CM

## 2021-02-20 HISTORY — PX: IR IMAGING GUIDED PORT INSERTION: IMG5740

## 2021-02-20 LAB — GLUCOSE, CAPILLARY: Glucose-Capillary: 115 mg/dL — ABNORMAL HIGH (ref 70–99)

## 2021-02-20 MED ORDER — HEPARIN SOD (PORK) LOCK FLUSH 100 UNIT/ML IV SOLN
INTRAVENOUS | Status: AC
  Filled 2021-02-20: qty 5

## 2021-02-20 MED ORDER — FENTANYL CITRATE (PF) 100 MCG/2ML IJ SOLN
INTRAMUSCULAR | Status: AC
  Filled 2021-02-20: qty 2

## 2021-02-20 MED ORDER — LIDOCAINE-EPINEPHRINE 1 %-1:100000 IJ SOLN
INTRAMUSCULAR | Status: AC
  Filled 2021-02-20: qty 1

## 2021-02-20 MED ORDER — LIDOCAINE HCL (PF) 1 % IJ SOLN
INTRAMUSCULAR | Status: AC | PRN
  Administered 2021-02-20: 15 mL

## 2021-02-20 MED ORDER — HEPARIN SOD (PORK) LOCK FLUSH 100 UNIT/ML IV SOLN
INTRAVENOUS | Status: AC | PRN
  Administered 2021-02-20: 500 [IU] via INTRAVENOUS

## 2021-02-20 MED ORDER — MIDAZOLAM HCL 2 MG/2ML IJ SOLN
INTRAMUSCULAR | Status: AC
  Filled 2021-02-20: qty 4

## 2021-02-20 MED ORDER — MIDAZOLAM HCL 2 MG/2ML IJ SOLN
INTRAMUSCULAR | Status: AC | PRN
  Administered 2021-02-20 (×3): 1 mg via INTRAVENOUS

## 2021-02-20 MED ORDER — FENTANYL CITRATE (PF) 100 MCG/2ML IJ SOLN
INTRAMUSCULAR | Status: AC | PRN
Start: 2021-02-20 — End: 2021-02-20
  Administered 2021-02-20 (×2): 50 ug via INTRAVENOUS

## 2021-02-20 NOTE — Procedures (Signed)
Interventional Radiology Procedure Note  Procedure: Placement of a right IJ approach single lumen PowerPort.  Tip is positioned at the superior cavoatrial junction and catheter is ready for immediate use.  Complications: No immediate Recommendations:  - Ok to shower tomorrow - Do not submerge for 7 days - Routine line care   Signed,  Jeweldean Drohan K. Tashea Othman, MD   

## 2021-02-20 NOTE — Progress Notes (Signed)
GYN Location of Tumor / Histology: Endocervix/endometrium with lymph node involvement    Biopsies revealed:  01/12/2021    01/29/2021      Past/Anticipated interventions by Gyn/Onc surgery, if any:       Past/Anticipated interventions by medical oncology, if any: Per Dr Alvy Bimler plan to do carboplatin and taxol to start on 02/28/2021  Weight changes, if any: No  Bowel/Bladder complaints, if any: No.,    Nausea/Vomiting, if any: no  Pain issues, if any:  no  SAFETY ISSUES:  Prior radiation? no  Pacemaker/ICD? no  Possible current pregnancy? No Hysterectomy   Is the patient on methotrexate? no  Current Complaints / other details:  Daughter [name] is present with patient today.  Lives with daughter.  Denies vaginal discharge/bleeding.  Vitals:   02/26/21 1253  BP: (!) 148/75  Pulse: 68  Resp: 16  Temp: (!) 96.1 F (35.6 C)  TempSrc: Temporal  SpO2: 99%  Weight: 215 lb 6.4 oz (97.7 kg)  Height: 5\' 2"  (1.575 m)

## 2021-02-20 NOTE — Consult Note (Signed)
Chief Complaint: Patient was seen in consultation today for port a cath placement  Referring Physician(s): [place]  Supervising Physician: Jacqulynn Cadet  Patient Status: [facility] - Out-pt  History of Present Illness: [name] is a 65 y.o. 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 She is s/p robotic assisted total hysterectomy with BSO on 01/29/21. She presents today for port a cath placement for chemotherapy.   Past Medical History:  Diagnosis Date  . Anginal pain ([place]) 2016  . Cancer ([place])   . Coronary atherosclerosis of native coronary artery, with stent to LCX in 2006 06/04/2013  . Diabetes mellitus without complication ([place])   . GERD (gastroesophageal reflux disease)   . Heart attack ([place]) 2006  . Hyperlipidemia LDL goal < 70 06/07/2013  . Hypertension   . Obesity   . S/P coronary artery stent placement to RCA with PROMUS DES, 06/07/13, residual disease on OM1, OM2 and rPDA 06/07/2013    Past Surgical History:  Procedure Laterality Date  . CARDIAC CATHETERIZATION N/A 08/22/2015   Procedure: Left Heart Cath and Coronary Angiography;  Surgeon: Leonie Man, MD;  Location: [place] CV LAB;  Service: Cardiovascular;  Laterality: N/A;  . CARDIAC CATHETERIZATION N/A 08/22/2015   Procedure: Coronary Stent Intervention;  Surgeon: Leonie Man, MD;  Location: [place] CV [facility];  Service: Cardiovascular;  Laterality: N/A;  . CARDIAC CATHETERIZATION N/A 08/22/2015   Procedure: Left Heart Cath and Coronary Angiography;  Surgeon: Leonie Man, MD;  Location: [place] [facility];  Service: Cardiovascular;  Laterality: N/A;  . CORONARY STENT PLACEMENT  2006   TAXUS stent [facility]X in [facility]  . LEFT HEART CATH AND CORONARY ANGIOGRAPHY N/A 07/21/2018   Procedure: LEFT HEART CATH AND CORONARY ANGIOGRAPHY;  Surgeon: Troy Sine, MD;  Location: [place] CV [facility];  Service:  Cardiovascular;  Laterality: N/A;  . LEFT HEART CATHETERIZATION WITH CORONARY ANGIOGRAM N/A 06/07/2013   Procedure: LEFT HEART CATHETERIZATION WITH CORONARY ANGIOGRAM;  Surgeon: Sanda Klein, MD;  Location: [place] CATH LAB;  Service: Cardiovascular;  Laterality: N/A;  . LYMPH NODE DISSECTION N/A 01/29/2021   Procedure: SELECTIVE LYMPH NODE DISSECTION;  Surgeon: Lafonda Mosses, MD;  Location: [facility];  Service: Gynecology;  Laterality: N/A;  . ROBOTIC ASSISTED TOTAL HYSTERECTOMY WITH BILATERAL SALPINGO OOPHERECTOMY Bilateral 01/29/2021   Procedure: XI ROBOTIC ASSISTED TOTAL HYSTERECTOMY WITH BILATERAL SALPINGO OOPHORECTOMY,;  Surgeon: Lafonda Mosses, MD;  Location: [facility];  Service: Gynecology;  Laterality: Bilateral;  . SENTINEL NODE BIOPSY N/A 01/29/2021   Procedure: SENTINEL NODE BIOPSY;  Surgeon: Lafonda Mosses, MD;  Location: [facility];  Service: Gynecology;  Laterality: N/A;    Allergies: Patient has no known allergies.  Medications: Prior to Admission medications   Medication Sig Start Date End Date Taking? Authorizing Provider  acetaminophen (TYLENOL) 500 MG tablet Take 1 tablet (500 mg total) by mouth every 6 (six) hours as needed. 12/31/19  Yes Fawze, Mina A, PA-C  aspirin EC 81 MG EC tablet Take 1 tablet (81 mg total) by mouth daily. 08/24/15  Yes Kilroy, Luke K, PA-C  atorvastatin (LIPITOR) 80 MG tablet TAKE 1 TABLET (80 MG TOTAL) BY MOUTH AT BEDTIME. 01/12/21  Yes Newlin, Enobong, MD  bismuth subsalicylate (PEPTO BISMOL) 262 MG/15ML suspension Take 30 mLs by mouth every 6 (six) hours as needed for indigestion or diarrhea or loose stools.   Yes [provider]  Dulaglutide (  TRULICITY) 1.5 EP/3.2RJ SOPN Inject 1.5 mg into the skin once a week. Patient taking differently: Inject 1.5 mg into the skin every Friday. 04/18/20  Yes Newlin, [name], MD  furosemide (LASIX) 20 MG tablet TAKE 2 TABLETS (40 MG TOTAL) BY MOUTH DAILY. Patient taking differently: Take 40 mg by mouth  daily. 12/06/20  Yes Newlin, Enobong, MD  glimepiride (AMARYL) 4 MG tablet TAKE 2 TABLETS (8 MG TOTAL) BY MOUTH DAILY WITH BREAKFAST. 01/08/21  Yes Newlin, Enobong, MD  Insulin Pen Needle (TRUEPLUS PEN NEEDLES) 32G X 4 MM MISC Use as directed to inject victoza once daily 08/20/18  Yes Newlin, Enobong, MD  isosorbide mononitrate (IMDUR) 30 MG 24 hr tablet TAKE 2 TABLETS (60 MG TOTAL) BY MOUTH DAILY. 01/08/21  Yes [name], MD  lisinopril (PRINIVIL) 20 MG tablet Take 1 tablet (20 mg total) by mouth daily. 02/19/21  Yes Gorsuch, Ni, MD  metFORMIN (GLUCOPHAGE) 500 MG tablet TAKE 2 TABLETS (1,000 MG TOTAL) BY MOUTH 2 (TWO) TIMES DAILY WITH A MEAL. 01/08/21  Yes [name], MD  metoprolol tartrate (LOPRESSOR) 100 MG tablet Take 1 tablet (100 mg total) by mouth 2 (two) times daily. 01/18/20  Yes Newlin, [name], MD  ondansetron (ZOFRAN) 8 MG tablet Take 1 tablet (8 mg total) by mouth every 8 (eight) hours as needed. 02/13/21  Yes Gorsuch, Ni, MD  pantoprazole (PROTONIX) 40 MG tablet TAKE 1 TABLET (40 MG TOTAL) BY MOUTH DAILY. 01/08/21  Yes [name], MD  prochlorperazine (COMPAZINE) 10 MG tablet Take 1 tablet (10 mg total) by mouth every 6 (six) hours as needed (Nausea or vomiting). 02/13/21  Yes Gorsuch, Ni, MD  senna-docusate (SENOKOT-S) 8.6-50 MG tablet Take 2 tablets by mouth at bedtime. For AFTER surgery, do not take if having diarrhea 01/16/21  Yes Cross, Melissa D, NP  traMADol (ULTRAM) 50 MG tablet Take 1 tablet (50 mg total) by mouth every 6 (six) hours as needed for severe pain. For AFTER surgery only, do not take and drive 12/23/82  Yes Cross, Melissa D, NP  dexamethasone (DECADRON) 4 MG tablet Take 2 tabs at the night before chemotherapy, every 3 weeks, by mouth x 6 cycles 02/13/21   Heath Lark, MD  lidocaine-prilocaine (EMLA) cream Apply to affected area once 02/13/21   Heath Lark, MD  nitroGLYCERIN (NITROSTAT) 0.4 MG SL tablet Place 1 tablet (0.4 mg total) under the tongue every 5 (five) minutes  as needed for chest pain. 08/24/15   Erlene Quan, PA-C     Family History  Problem Relation Age of Onset  . Diabetes Mother   . Hypertension Mother   . Diabetes Sister   . Diabetes Brother   . Hypertension Brother   . Colon cancer Father   . Breast cancer Neg Hx   . Ovarian cancer Neg Hx   . Endometrial cancer Neg Hx   . Prostate cancer Neg Hx   . Pancreatic cancer Neg Hx     Social History   Socioeconomic History  . Marital status: Divorced    Spouse name: Not on file  . Number of children: 1  . Years of education: Not on file  . Highest education level: Not on file  Occupational History  . Occupation: Scientist, water quality  Tobacco Use  . Smoking status: Former Smoker    Years: 25.00    Types: Cigars    Quit date: 06/23/2004    Years since quitting: 16.6  . Smokeless tobacco: Never Used  . Tobacco comment: "a few  black and milds a day" CigrettesX 30 years  Vaping Use  . Vaping Use: Never used  Substance and Sexual Activity  . Alcohol use: Yes    Comment: occ  . Drug use: No  . Sexual activity: Not Currently  Other Topics Concern  . Not on file  Social History Narrative  . Not on file   Social Determinants of Health   Financial Resource Strain: Not on file  Food Insecurity: No Food Insecurity  . Worried About Charity fundraiser in the Last Year: Never true  . Ran Out of Food in the Last Year: Never true  Transportation Needs: No Transportation Needs  . Lack of Transportation (Medical): No  . Lack of Transportation (Non-Medical): No  Physical Activity: Not on file  Stress: Not on file  Social Connections: Not on file      Review of Systems currently denies fever,HA,CP,dyspnea, cough, back pain,N/V or bleeding. She does have some lower abdominal/pelvic soreness.  Vital Signs: Pulse 82   Temp 98.3 F (36.8 C) (Oral)   Resp 20   SpO2 99%   Physical Exam awake/alert; chest- CTA bilat ; heart- RRR; abd- soft,+BS,mildly tender lower abd/pelvic regions; no sig LE  edema  Imaging: CT CHEST ABDOMEN PELVIS W CONTRAST  Result Date: 01/26/2021 CLINICAL DATA:  Endometrial cancer.  Staging. EXAM: CT CHEST, ABDOMEN, AND PELVIS WITH CONTRAST TECHNIQUE: Multidetector CT imaging of the chest, abdomen and pelvis was performed following the standard protocol during bolus administration of intravenous contrast. CONTRAST:  178mL OMNIPAQUE IOHEXOL 300 MG/ML  SOLN COMPARISON:  None. FINDINGS: CT CHEST FINDINGS Cardiovascular: The heart size is normal. No substantial pericardial effusion. Coronary artery calcification is evident. Atherosclerotic calcification is noted in the wall of the thoracic aorta. Mediastinum/Nodes: No mediastinal lymphadenopathy. There is no hilar lymphadenopathy. The esophagus has normal imaging features. There is no axillary lymphadenopathy. Lungs/Pleura: 5 mm ground-glass opacity identified peripheral left upper lobe on image 52/5. No overtly suspicious nodule or mass. No focal airspace consolidation. Insert no effusions Musculoskeletal: No worrisome lytic or sclerotic osseous abnormality. Degenerative changes noted right sternoclavicular joint. CT ABDOMEN PELVIS FINDINGS Hepatobiliary: No suspicious focal abnormality within the liver parenchyma. There is no evidence for gallstones, gallbladder wall thickening, or pericholecystic fluid. No intrahepatic or extrahepatic biliary dilation. Pancreas: No focal mass lesion. No dilatation of the main duct. No intraparenchymal cyst. No peripancreatic edema. Spleen: Capsular retraction overlying focal region of hypoperfusion, potentially from prior infarct. Adrenals/Urinary Tract: No adrenal nodule or mass. Kidneys unremarkable. No evidence for hydroureter. The urinary bladder appears normal for the degree of distention. Stomach/Bowel: Stomach is distended with food and contrast material. Duodenum is normally positioned as is the ligament of Treitz. No small bowel wall thickening. No small bowel dilatation. The terminal  ileum is normal. The appendix is normal. No gross colonic mass. No colonic wall thickening. Diverticular changes are noted in the left colon without evidence of diverticulitis. Vascular/Lymphatic: There is abdominal aortic atherosclerosis without aneurysm. There is no gastrohepatic or hepatoduodenal ligament lymphadenopathy. No retroperitoneal or mesenteric lymphadenopathy. No pelvic sidewall lymphadenopathy by CT size criteria although small lymph nodes are seen along both pelvic sidewalls including 8 mm short axis right external iliac node on 96/3 and 8 mm short axis left external iliac node on 100/3. Reproductive: 5.8 cm lesion posterior lower uterine segment may be a fibroid. There is small volume fluid in the endometrial canal with 7 mm hypoattenuating lesion in the myometrium of the anterior fundus. Heterogeneous enhancement is seen  around the endometrial canal, nonspecific. There is no adnexal mass. Other: No intraperitoneal free fluid. Musculoskeletal: No worrisome lytic or sclerotic osseous abnormality. IMPRESSION: 1. Small volume fluid in the endometrial canal with 7 mm hypoattenuating lesion in the myometrium of the anterior fundus. 2. No definite evidence for metastatic disease in the chest, abdomen, or pelvis. Small lymph nodes are noted along both pelvic sidewalls. Attention on follow-up recommended. 3. 5 mm ground-glass opacity peripheral left upper lobe. This is likely related to infection/inflammation and potentially scar. Attention on surveillance imaging recommended. 4. 5.8 cm lesion posterior lower uterine segment likely a fibroid. Ultrasound exam from 01/28/2010 demonstrated a 5.2 cm lesion in the left aspect of the posterior lower uterine body. 5. Aortic Atherosclerosis (ICD10-I70.0). Electronically Signed   By: Misty Stanley M.D.   On: 01/26/2021 10:51   ECHOCARDIOGRAM COMPLETE  Result Date: 02/19/2021    ECHOCARDIOGRAM REPORT   Patient Name:   [name] Date of Exam: 02/19/2021 Medical  Rec #:  [id number]     Height:       62.0 in Accession #:    [id number]    Weight:       217.2 lb Date of Birth:  27-Apr-1956    BSA:          1.980 m Patient Age:    5 years      BP:           161/72 mmHg Patient Gender: F             HR:           67 bpm. Exam Location:  Outpatient Procedure: 2D Echo Indications:    Chemo Z09  History:        Patient has prior history of Echocardiogram examinations, most                 recent 06/19/2019. Risk Factors:Hypertension, Diabetes and                 Dyslipidemia.  Sonographer:    Mikki Santee RDCS (AE) Referring Phys: [phone] NI [place]  1. Left ventricular ejection fraction, by estimation, is 55 to 60%. The left ventricle has normal function. The left ventricle demonstrates regional wall motion abnormalities (see scoring diagram/findings for description). There is mild left ventricular  hypertrophy. Left ventricular diastolic parameters are consistent with Grade I diastolic dysfunction (impaired relaxation). There is moderate hypokinesis of the left ventricular, basal septal wall and inferior wall. The average left ventricular global longitudinal strain is -17.9 %. The global longitudinal strain is abnormal.  2. Right ventricular systolic function is normal. The right ventricular size is normal. There is normal pulmonary artery systolic pressure. The estimated right ventricular systolic pressure is 32.4 mmHg.  3. The mitral valve is abnormal. Trivial mitral valve regurgitation.  4. The aortic valve is tricuspid. Aortic valve regurgitation is not visualized.  5. The inferior vena cava is normal in size with greater than 50% respiratory variability, suggesting right atrial pressure of 3 mmHg. Comparison(s): Changes from prior study are noted. 06/19/2019: LVEF >65%, mild LVH, mild biatrial enlargement, RVSP 46 mmHg. FINDINGS  Left Ventricle: Left ventricular ejection fraction, by estimation, is 55 to 60%. The left ventricle has normal function. The left  ventricle demonstrates regional wall motion abnormalities. Moderate hypokinesis of the left ventricular, basal septal wall and inferior wall. The average left ventricular global longitudinal strain is -17.9 %. The global longitudinal strain is abnormal. The left ventricular internal cavity size  was normal in size. There is mild left ventricular hypertrophy. Left ventricular diastolic parameters are consistent with Grade I diastolic dysfunction (impaired relaxation). Indeterminate filling pressures. Right Ventricle: The right ventricular size is normal. No increase in right ventricular wall thickness. Right ventricular systolic function is normal. There is normal pulmonary artery systolic pressure. The tricuspid regurgitant velocity is 2.16 m/s, and  with an assumed right atrial pressure of 3 mmHg, the estimated right ventricular systolic pressure is 64.6 mmHg. Left Atrium: Left atrial size was normal in size. Right Atrium: Right atrial size was normal in size. Pericardium: There is no evidence of pericardial effusion. Mitral Valve: The mitral valve is abnormal. Mild mitral annular calcification. Trivial mitral valve regurgitation. Tricuspid Valve: The tricuspid valve is grossly normal. Tricuspid valve regurgitation is trivial. Aortic Valve: The aortic valve is tricuspid. Aortic valve regurgitation is not visualized. Pulmonic Valve: The pulmonic valve was grossly normal. Pulmonic valve regurgitation is not visualized. Aorta: The aortic root and ascending aorta are structurally normal, with no evidence of dilitation. Venous: The inferior vena cava is normal in size with greater than 50% respiratory variability, suggesting right atrial pressure of 3 mmHg. IAS/Shunts: No atrial level shunt detected by color flow Doppler.  LEFT VENTRICLE PLAX 2D LVIDd:         4.20 cm  Diastology LVIDs:         2.90 cm  LV e' medial:    4.90 cm/s LV PW:         1.10 cm  LV E/e' medial:  13.5 LV IVS:        1.00 cm  LV e' lateral:   5.66  cm/s LVOT diam:     2.20 cm  LV E/e' lateral: 11.7 LV SV:         70 LV SV Index:   35       2D Longitudinal Strain LVOT Area:     3.80 cm 2D Strain GLS Avg:     -17.9 %  RIGHT VENTRICLE RV S prime:     11.40 cm/s TAPSE (M-mode): 2.0 cm LEFT ATRIUM             Index       RIGHT ATRIUM           Index LA diam:        3.70 cm 1.87 cm/m  RA Area:     12.80 cm LA Vol (A2C):   56.5 ml 28.54 ml/m RA Volume:   25.30 ml  12.78 ml/m LA Vol (A4C):   54.8 ml 27.68 ml/m LA Biplane Vol: 59.6 ml 30.10 ml/m  AORTIC VALVE LVOT Vmax:   85.80 cm/s LVOT Vmean:  55.900 cm/s LVOT VTI:    0.184 m  AORTA Ao Root diam: 2.80 cm MITRAL VALVE               TRICUSPID VALVE MV Area (PHT): 2.17 cm    TR Peak grad:   18.7 mmHg MV Decel Time: 349 msec    TR Vmax:        216.00 cm/s MV E velocity: 66.10 cm/s MV A velocity: 94.30 cm/s  SHUNTS MV E/A ratio:  0.70        Systemic VTI:  0.18 m                            Systemic Diam: 2.20 cm Lyman Bishop MD Electronically signed by Lyman Bishop MD Signature Date/Time: 02/19/2021/11:54:28  AM    Final     Labs:  CBC: Recent Labs    11/21/20 1153 01/24/21 1147  WBC 3.1* 7.2  HGB 13.1 13.2  HCT 39.0 41.2  PLT 190 319    COAGS: No results for input(s): INR, APTT in the last 8760 hours.  BMP: Recent Labs    04/18/20 1030 10/18/20 1028 11/21/20 1153 01/16/21 1315 01/24/21 1147  NA 140 143 135 141 141  K 4.7 4.5 3.3* 3.5 3.5  CL 104 106 101 103 104  CO2 20 24 24 28 24   GLUCOSE 286* 117* 203* 154* 223*  BUN 24 13 10 18 20   CALCIUM 9.4 9.7 8.5* 9.6 9.4  CREATININE 1.05* 1.00 0.92 1.18* 1.11*  GFRNONAA 57* 61 >60 52* 56*  GFRAA 66 70  --   --   --     LIVER FUNCTION TESTS: Recent Labs    10/18/20 1028 01/16/21 1315  BILITOT 0.2 0.4  AST 15 17  ALT 13 16  ALKPHOS 111 100  PROT 6.7 7.3  ALBUMIN 4.1 3.5    TUMOR MARKERS: No results for input(s): AFPTM, CEA, CA199, CHROMGRNA in the last 8760 hours.  Assessment and Plan: 65 y.o. 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 She is s/p robotic assisted total hysterectomy with BSO on 01/29/21. She presents today for port a cath placement for chemotherapy.Risks and benefits of image guided port-a-catheter placement was discussed with the patient including, but not limited to bleeding, infection, pneumothorax, or fibrin sheath development and need for additional procedures.  All of the patient's questions were answered, patient is agreeable to proceed. Consent signed and in chart.    Thank you for this interesting consult.  I greatly enjoyed meeting [name] and look forward to participating in their care.  A copy of this report was sent to the requesting provider on this date.  Electronically Signed: D. Rowe Robert, PA-C 02/20/2021, 2:03 PM   I spent a total of 25 minutes   in face to face in clinical consultation, greater than 50% of which was counseling/coordinating care for port a cath placement

## 2021-02-20 NOTE — Discharge Instructions (Signed)
Please call Interventional Radiology clinic [phone] with any questions or concerns.  You may remove your dressing and shower tomorrow.  DO NOT use EMLA cream for 2 weeks after port placement as this cream will remove surgical glue on your incision.   Implanted Port Insertion, Care After This sheet gives you information about how to care for yourself after your procedure. Your health care provider may also give you more specific instructions. If you have problems or questions, contact your health care provider. What can I expect after the procedure? After the procedure, it is common to have:  Discomfort at the port insertion site.  Bruising on the skin over the port. This should improve over 3-4 days. Follow these instructions at home: Port care  After your port is placed, you will get a manufacturer's information card. The card has information about your port. Keep this card with you at all times.  Take care of the port as told by your health care provider. Ask your health care provider if you or a family member can get training for taking care of the port at home. A home health care nurse may also take care of the port.  Make sure to remember what type of port you have. Incision care  Follow instructions from your health care provider about how to take care of your port insertion site. Make sure you: ? Wash your hands with soap and water before and after you change your bandage (dressing). If soap and water are not available, use hand sanitizer. ? Change your dressing as told by your health care provider. ? Leave stitches (sutures), skin glue, or adhesive strips in place. These skin closures may need to stay in place for 2 weeks or longer. If adhesive strip edges start to loosen and curl up, you may trim the loose edges. Do not remove adhesive strips completely unless your health care provider tells you to do that.  Check your port insertion site every day for signs of infection.  Check for: ? Redness, swelling, or pain. ? Fluid or blood. ? Warmth. ? Pus or a bad smell.      Activity  Return to your normal activities as told by your health care provider. Ask your health care provider what activities are safe for you.  Do not lift anything that is heavier than 10 lb (4.5 kg), or the limit that you are told, until your health care provider says that it is safe. General instructions  Take over-the-counter and prescription medicines only as told by your health care provider.  Do not take baths, swim, or use a hot tub until your health care provider approves. Ask your health care provider if you may take showers. You may only be allowed to take sponge baths.  Do not drive for 24 hours if you were given a sedative during your procedure.  Wear a medical alert bracelet in case of an emergency. This will tell any health care providers that you have a port.  Keep all follow-up visits as told by your health care provider. This is important. Contact a health care provider if:  You cannot flush your port with saline as directed, or you cannot draw blood from the port.  You have a fever or chills.  You have redness, swelling, or pain around your port insertion site.  You have fluid or blood coming from your port insertion site.  Your port insertion site feels warm to the touch.  You have pus or a   bad smell coming from the port insertion site. Get help right away if:  You have chest pain or shortness of breath.  You have bleeding from your port that you cannot control. Summary  Take care of the port as told by your health care provider. Keep the manufacturer's information card with you at all times.  Change your dressing as told by your health care provider.  Contact a health care provider if you have a fever or chills or if you have redness, swelling, or pain around your port insertion site.  Keep all follow-up visits as told by your health care  provider. This information is not intended to replace advice given to you by your health care provider. Make sure you discuss any questions you have with your health care provider. Document Revised: 06/30/2018 Document Reviewed: 06/30/2018 Elsevier Patient Education  2021 [employer]    Moderate Conscious Sedation, Adult, Care After This sheet gives you information about how to care for yourself after your procedure. Your health care provider may also give you more specific instructions. If you have problems or questions, contact your health care provider. What can I expect after the procedure? After the procedure, it is common to have:  Sleepiness for several hours.  Impaired judgment for several hours.  Difficulty with balance.  Vomiting if you eat too soon. Follow these instructions at home: For the time period you were told by your health care provider:  Rest.  Do not participate in activities where you could fall or become injured.  Do not drive or use machinery.  Do not drink alcohol.  Do not take sleeping pills or medicines that cause drowsiness.  Do not make important decisions or sign legal documents.  Do not take care of children on your own.      Eating and drinking  Follow the diet recommended by your health care provider.  Drink enough fluid to keep your urine pale yellow.  If you vomit: ? Drink water, juice, or soup when you can drink without vomiting. ? Make sure you have little or no nausea before eating solid foods.   General instructions  Take over-the-counter and prescription medicines only as told by your health care provider.  Have a responsible adult stay with you for the time you are told. It is important to have someone help care for you until you are awake and alert.  Do not smoke.  Keep all follow-up visits as told by your health care provider. This is important. Contact a health care provider if:  You are still sleepy or having  trouble with balance after 24 hours.  You feel light-headed.  You keep feeling nauseous or you keep vomiting.  You develop a rash.  You have a fever.  You have redness or swelling around the IV site. Get help right away if:  You have trouble breathing.  You have new-onset confusion at home. Summary  After the procedure, it is common to feel sleepy, have impaired judgment, or feel nauseous if you eat too soon.  Rest after you get home. Know the things you should not do after the procedure.  Follow the diet recommended by your health care provider and drink enough fluid to keep your urine pale yellow.  Get help right away if you have trouble breathing or new-onset confusion at home. This information is not intended to replace advice given to you by your health care provider. Make sure you discuss any questions you have with your health   care provider. Document Revised: 03/31/2020 Document Reviewed: 10/28/2019 Elsevier Patient Education  2021 [employer]  

## 2021-02-22 ENCOUNTER — Other Ambulatory Visit: Payer: Self-pay | Admitting: Family Medicine

## 2021-02-22 ENCOUNTER — Other Ambulatory Visit: Payer: Self-pay

## 2021-02-22 DIAGNOSIS — I10 Essential (primary) hypertension: Secondary | ICD-10-CM

## 2021-02-22 MED FILL — GLIMEPIRIDE 4 MG TABS: 4 | 30 days supply | Qty: 60 | Fill #1

## 2021-02-22 MED FILL — ISOSORBIDE MN ER 30 MG TAB: 30 | 30 days supply | Qty: 60 | Fill #1

## 2021-02-22 MED FILL — PANTOPRAZOLE SOD DR 40 MG T: 40 | 30 days supply | Qty: 30 | Fill #1

## 2021-02-22 MED FILL — ?METOPROLOL TART 100 MG TAB: 100 | 30 days supply | Qty: 60 | Fill #0

## 2021-02-22 MED FILL — FUROSEMIDE 20 MG TABS: 20 | 30 days supply | Qty: 60 | Fill #2

## 2021-02-22 MED FILL — ATORVASTATIN CALCIUM 80 MG: 80 | 30 days supply | Qty: 30 | Fill #1

## 2021-02-22 MED FILL — ?CLOPIDOGREL 75MG TABL: 75 | 30 days supply | Qty: 30 | Fill #1

## 2021-02-22 MED FILL — METFORMIN HCL 500 MG TABS: 500 | 30 days supply | Qty: 120 | Fill #1

## 2021-02-22 NOTE — Telephone Encounter (Signed)
Requested Prescriptions  Pending Prescriptions Disp Refills  . metoprolol tartrate (LOPRESSOR) 100 MG tablet [Pharmacy Med Name: METOPROLOL TART 100 MG TAB 100 Tablet] 60 tablet 6    Sig: TAKE 1 TABLET (100 MG TOTAL) BY MOUTH 2 (TWO) TIMES DAILY.     Cardiovascular:  Beta Blockers Failed - 02/22/2021 11:00 AM      Failed - Last BP in normal range    BP Readings from Last 1 Encounters:  02/20/21 (!) 188/76         Passed - Last Heart Rate in normal range    Pulse Readings from Last 1 Encounters:  02/20/21 68         Passed - Valid encounter within last 6 months    Recent Outpatient Visits          2 months ago Post-menopausal bleeding   [place], [place], MD   4 months ago Type 2 diabetes mellitus with other circulatory complication, without long-term current use of insulin ([place])   [place], [place], MD   10 months ago Type 2 diabetes mellitus with other circulatory complication, without long-term current use of insulin ([place])   Blod[place], [place], MD   1 year ago Type 2 diabetes mellitus with other circulatory complication, without long-term current use of insulin ([place])   [place], [name], MD   1 year ago Dyslipidemia   [place], [place], MD      Future Appointments            In 1 week Heath Lark, MD [facility]

## 2021-02-25 NOTE — Progress Notes (Signed)
Radiation Oncology         [phone] ________________________________  Initial Outpatient Consultation  Name: [name] MRN: [id number]  Date: 02/26/2021  DOB: Dec 07, 1956  KG:[name], [name], MD  Lafonda Mosses, MD   REFERRING PHYSICIAN: Lafonda Mosses, MD  DIAGNOSIS: The encounter diagnosis was Papillary serous adenocarcinoma of endometrium ([place]).  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 female who is seen as a courtesy of Dr. Berline Lopes for an opinion concerning radiation therapy as part of management for her recently diagnosed endometrial cancer. Today, she is accompanied by her daughter. The patient presented to Dr. Margarita Rana, PCP, on 12/14/2020 for evaluation of postmenopausal spotting. PAP smear was performed at that time and revealed adenocarcinoma.   Subsequently, the patient was seen by Dr. Hale Bogus, gynecologist, who performed an endocervical curettage and endometrial biopsy that revealed adenocarcinoma.  CT scan of chest, abdomen, and pelvis on 01/25/2021 showed small volume fluid in the endometrial canal with a 7 mm hypoattenuating lesion in the myometrium of the anterior fundus. There was no definite evidence for metastatic disease in the chest, abdomen, or pelvis. However, there were small lymph nodes noted along both pelvic sidewalls, a 5 mm ground-glass opacity in the peripheral left upper lobe (likely related to infection/inflammation and potentially scar), and a 5.8 cm lesion in the posterior lower uterine segment that was likely a fibroid.  Dr. Berline Lopes performed a robotic-assisted laparoscopic total hysterectomy with bilateral salpingo-oophorectomy, sentinel lymph node biopsy on the left, selective pelvic LND (external and common iliac on the right), and an omentectomy on 01/29/2021. Pathology from the procedure revealed high-garde serous carcinoma of the endometrium with invasion of more  than half of the myometrium. The tumor invaded the stromal connective tissue of the cervix and there was also noted to be lymphovascular invasion. However, there was no involvement of the uterine serosa or adnexa. The omentum was negative for carcinoma. Two right pelvic lymph nodes were negative for carcinoma. 1/1 left obturator sentinel lymph node, 1/3 left pelvic lymph nodes, and 1/1 right external and common iliac lymph node were positive for metastatic carcinoma.  Operative findings of note were that the patient was noted to have several enlarged pelvic nodes, many of which were removed and returned positive for malignancy.  The patient's case was presented at the Gynecologic Oncology Multidisciplinary Conference on 02/12/2021, at which time it was recommended that she proceed with systemic chemotherapy with Carboplatin/Taxol and Trastuzumab followed by external beam radiation to the pelvis and consideration for vaginal brachytherapy.  PREVIOUS RADIATION THERAPY: No  PAST MEDICAL HISTORY:  Past Medical History:  Diagnosis Date   Anginal pain ([place]) 2016   Cancer ([place])    Coronary atherosclerosis of native coronary artery, with stent to LCX in 2006 06/04/2013   Diabetes mellitus without complication (H[facility])    GERD (gastroesophageal reflux disease)    Heart attack ([place]) 2006   Hyperlipidemia LDL goal < 70 06/07/2013   Hypertension    Obesity    S/P coronary artery stent placement to RCA with PROMUS DES, 06/07/13, residual disease on OM1, OM2 and rPDA 06/07/2013    PAST SURGICAL HISTORY: Past Surgical History:  Procedure Laterality Date   CARDIAC CATHETERIZATION N/A 08/22/2015   Procedure: Left Heart Cath and Coronary Angiography;  Surgeon: Leonie Man, MD;  Location: [facility];  Service: Cardiovascular;  Laterality: N/A;   CARDIAC CATHETERIZATION N/A 08/22/2015   Procedure: Coronary  Stent Intervention;  Surgeon: Leonie Man, MD;  Location: [facility];   Service: Cardiovascular;  Laterality: N/A;   CARDIAC CATHETERIZATION N/A 08/22/2015   Procedure: Left Heart Cath and Coronary Angiography;  Surgeon: Leonie Man, MD;  Location: [facility];  Service: Cardiovascular;  Laterality: N/A;   CORONARY STENT PLACEMENT  2006   TAXUS stent CFX in [facility]   IR IMAGING GUIDED PORT INSERTION  02/20/2021   LEFT HEART CATH AND CORONARY ANGIOGRAPHY N/A 07/21/2018   Procedure: LEFT HEART CATH AND CORONARY ANGIOGRAPHY;  Surgeon: Troy Sine, MD;  Location: [place] [facility];  Service: Cardiovascular;  Laterality: N/A;   LEFT HEART CATHETERIZATION WITH CORONARY ANGIOGRAM N/A 06/07/2013   Procedure: LEFT HEART CATHETERIZATION WITH CORONARY ANGIOGRAM;  Surgeon: Sanda Klein, MD;  Location: [facility];  Service: Cardiovascular;  Laterality: N/A;   LYMPH NODE DISSECTION N/A 01/29/2021   Procedure: SELECTIVE LYMPH NODE DISSECTION;  Surgeon: Lafonda Mosses, MD;  Location: [facility];  Service: Gynecology;  Laterality: N/A;   ROBOTIC ASSISTED TOTAL HYSTERECTOMY WITH BILATERAL SALPINGO OOPHERECTOMY Bilateral 01/29/2021   Procedure: XI ROBOTIC ASSISTED TOTAL HYSTERECTOMY WITH BILATERAL SALPINGO OOPHORECTOMY,;  Surgeon: Lafonda Mosses, MD;  Location: [facility];  Service: Gynecology;  Laterality: Bilateral;   SENTINEL NODE BIOPSY N/A 01/29/2021   Procedure: SENTINEL NODE BIOPSY;  Surgeon: Lafonda Mosses, MD;  Location: [facility];  Service: Gynecology;  Laterality: N/A;    FAMILY HISTORY:  Family History  Problem Relation Age of Onset   Diabetes Mother    Hypertension Mother    Diabetes Sister    Diabetes Brother    Hypertension Brother    Colon cancer Father    Breast cancer Neg Hx    Ovarian cancer Neg Hx    Endometrial cancer Neg Hx    Prostate cancer Neg Hx    Pancreatic cancer Neg Hx     SOCIAL HISTORY:  Social History   Tobacco Use   Smoking status: Former Smoker    Years: 25.00    Types: Cigars    Quit date:  06/23/2004    Years since quitting: 16.6   Smokeless tobacco: Never Used   Tobacco comment: "a few black and milds a day" CigrettesX 30 years  Vaping Use   Vaping Use: Never used  Substance Use Topics   Alcohol use: Yes    Comment: occ   Drug use: No    ALLERGIES: No Known Allergies  MEDICATIONS:  Current Outpatient Medications  Medication Sig Dispense Refill   acetaminophen (TYLENOL) 500 MG tablet Take 1 tablet (500 mg total) by mouth every 6 (six) hours as needed. 30 tablet 0   aspirin EC 81 MG EC tablet Take 1 tablet (81 mg total) by mouth daily.     atorvastatin (LIPITOR) 80 MG tablet TAKE 1 TABLET (80 MG TOTAL) BY MOUTH AT BEDTIME. 30 tablet 6   bismuth subsalicylate (PEPTO BISMOL) 262 MG/15ML suspension Take 30 mLs by mouth every 6 (six) hours as needed for indigestion or diarrhea or loose stools.     dexamethasone (DECADRON) 4 MG tablet Take 2 tabs at the night before chemotherapy, every 3 weeks, by mouth x 6 cycles 12 tablet 6   Dulaglutide (TRULICITY) 1.5 TD/1.7OH SOPN Inject 1.5 mg into the skin once a week. (Patient taking differently: Inject 1.5 mg into the skin every Friday.) 4 pen 3   furosemide (LASIX) 20 MG tablet TAKE 2 TABLETS (40 MG TOTAL) BY MOUTH  DAILY. (Patient taking differently: Take 40 mg by mouth daily.) 60 tablet 2   glimepiride (AMARYL) 4 MG tablet TAKE 2 TABLETS (8 MG TOTAL) BY MOUTH DAILY WITH BREAKFAST. 60 tablet 6   isosorbide mononitrate (IMDUR) 30 MG 24 hr tablet TAKE 2 TABLETS (60 MG TOTAL) BY MOUTH DAILY. 60 tablet 1   lidocaine-prilocaine (EMLA) cream Apply to affected area once 30 g 3   lisinopril (PRINIVIL) 20 MG tablet Take 1 tablet (20 mg total) by mouth daily. 30 tablet 9   metFORMIN (GLUCOPHAGE) 500 MG tablet TAKE 2 TABLETS (1,000 MG TOTAL) BY MOUTH 2 (TWO) TIMES DAILY WITH A MEAL. 120 tablet 1   metoprolol tartrate (LOPRESSOR) 100 MG tablet TAKE 1 TABLET (100 MG TOTAL) BY MOUTH 2 (TWO) TIMES DAILY. 60 tablet 6   senna-docusate  (SENOKOT-S) 8.6-50 MG tablet Take 2 tablets by mouth at bedtime. For AFTER surgery, do not take if having diarrhea 30 tablet 0   traMADol (ULTRAM) 50 MG tablet Take 1 tablet (50 mg total) by mouth every 6 (six) hours as needed for severe pain. For AFTER surgery only, do not take and drive 10 tablet 0   Insulin Pen Needle (TRUEPLUS PEN NEEDLES) 32G X 4 MM MISC Use as directed to inject victoza once daily (Patient not taking: Reported on 02/26/2021) 100 each 3   nitroGLYCERIN (NITROSTAT) 0.4 MG SL tablet Place 1 tablet (0.4 mg total) under the tongue every 5 (five) minutes as needed for chest pain. (Patient not taking: Reported on 02/26/2021) 25 tablet 2   ondansetron (ZOFRAN) 8 MG tablet Take 1 tablet (8 mg total) by mouth every 8 (eight) hours as needed. (Patient not taking: Reported on 02/26/2021) 30 tablet 1   pantoprazole (PROTONIX) 40 MG tablet TAKE 1 TABLET (40 MG TOTAL) BY MOUTH DAILY. (Patient not taking: Reported on 02/26/2021) 30 tablet 6   prochlorperazine (COMPAZINE) 10 MG tablet Take 1 tablet (10 mg total) by mouth every 6 (six) hours as needed (Nausea or vomiting). (Patient not taking: Reported on 02/26/2021) 30 tablet 1   No current facility-administered medications for this encounter.    REVIEW OF SYSTEMS:  A 10+ POINT REVIEW OF SYSTEMS WAS OBTAINED including neurology, dermatology, psychiatry, cardiac, respiratory, lymph, extremities, GI, GU, musculoskeletal, constitutional, reproductive, HEENT.  sHe denies any vaginal discharge or bleeding at this time.  She denies any pelvic pain.  She denies any swelling in her lower extremities or numbness.   PHYSICAL EXAM:  height is 5\' 2"  (1.575 m) and weight is 215 lb 6.4 oz (97.7 kg). Her temporal temperature is 96.1 F (35.6 C) (abnormal). Her blood pressure is 148/75 (abnormal) and her pulse is 68. Her respiration is 16 and oxygen saturation is 99%.   General: Alert and oriented, in no acute distress HEENT: Head is normocephalic.  Extraocular movements are intact. Oropharynx is clear. Neck: Neck is supple, no palpable cervical or supraclavicular lymphadenopathy. Heart: Regular in rate and rhythm with no murmurs, rubs, or gallops. Chest: Clear to auscultation bilaterally, with no rhonchi, wheezes, or rales. Abdomen: Soft, nontender, nondistended, with no rigidity or guarding. Extremities: No cyanosis or edema. Lymphatics: see Neck Exam Skin: No concerning lesions. Musculoskeletal: symmetric strength and muscle tone throughout. Neurologic: Cranial nerves II through XII are grossly intact. No obvious focalities. Speech is fluent. Coordination is intact. Psychiatric: Judgment and insight are intact. Affect is appropriate. Pelvic exam deferred until closer to simulation and treatment  ECOG = 1  0 - Asymptomatic (Fully active, able to carry  on all predisease activities without restriction)  1 - Symptomatic but completely ambulatory (Restricted in physically strenuous activity but ambulatory and able to carry out work of a light or sedentary nature. For example, light housework, office work)  2 - Symptomatic, <50% in bed during the day (Ambulatory and capable of all self care but unable to carry out any work activities. Up and about more than 50% of waking hours)  3 - Symptomatic, >50% in bed, but not bedbound (Capable of only limited self-care, confined to bed or chair 50% or more of waking hours)  4 - Bedbound (Completely disabled. Cannot carry on any self-care. Totally confined to bed or chair)  5 - Death   Eustace Pen MM, Creech RH, Tormey DC, et al. [phone]). "Toxicity and response criteria of the [facility]". [place] Oncol. 5 (6): 649-55  LABORATORY DATA:  Lab Results  Component Value Date   WBC 7.1 02/26/2021   HGB 12.4 02/26/2021   HCT 38.2 02/26/2021   MCV 91.8 02/26/2021   PLT 276 02/26/2021   NEUTROABS 3.7 02/26/2021   Lab Results  Component Value Date   NA 141 02/26/2021   K  3.8 02/26/2021   CL 108 02/26/2021   CO2 25 02/26/2021   GLUCOSE 124 (H) 02/26/2021   CREATININE 1.12 (H) 02/26/2021   CALCIUM 9.5 02/26/2021      RADIOGRAPHY: ECHOCARDIOGRAM COMPLETE  Result Date: 02/19/2021    ECHOCARDIOGRAM REPORT   Patient Name:   ANYLA ISRAELSON Date of Exam: 02/19/2021 Medical Rec #:  [id number]     Height:       62.0 in Accession #:    [id number]    Weight:       217.2 lb Date of Birth:  [birth year]    BSA:          1.980 m Patient Age:    3 years      BP:           161/72 mmHg Patient Gender: F             HR:           67 bpm. Exam Location:  Outpatient Procedure: 2D Echo Indications:    Chemo Z09  History:        Patient has prior history of Echocardiogram examinations, most                 recent 06/19/2019. Risk Factors:Hypertension, Diabetes and                 Dyslipidemia.  Sonographer:    Mikki Santee RDCS (AE) Referring Phys: [phone] NI [place]  1. Left ventricular ejection fraction, by estimation, is 55 to 60%. The left ventricle has normal function. The left ventricle demonstrates regional wall motion abnormalities (see scoring diagram/findings for description). There is mild left ventricular  hypertrophy. Left ventricular diastolic parameters are consistent with Grade I diastolic dysfunction (impaired relaxation). There is moderate hypokinesis of the left ventricular, basal septal wall and inferior wall. The average left ventricular global longitudinal strain is -17.9 %. The global longitudinal strain is abnormal.  2. Right ventricular systolic function is normal. The right ventricular size is normal. There is normal pulmonary artery systolic pressure. The estimated right ventricular systolic pressure is 71.0 mmHg.  3. The mitral valve is abnormal. Trivial mitral valve regurgitation.  4. The aortic valve is tricuspid. Aortic valve regurgitation is not visualized.  5. The inferior vena cava is normal in  size with greater than 50% respiratory variability,  suggesting right atrial pressure of 3 mmHg. Comparison(s): Changes from prior study are noted. 06/19/2019: LVEF >65%, mild LVH, mild biatrial enlargement, RVSP 46 mmHg. FINDINGS  Left Ventricle: Left ventricular ejection fraction, by estimation, is 55 to 60%. The left ventricle has normal function. The left ventricle demonstrates regional wall motion abnormalities. Moderate hypokinesis of the left ventricular, basal septal wall and inferior wall. The average left ventricular global longitudinal strain is -17.9 %. The global longitudinal strain is abnormal. The left ventricular internal cavity size was normal in size. There is mild left ventricular hypertrophy. Left ventricular diastolic parameters are consistent with Grade I diastolic dysfunction (impaired relaxation). Indeterminate filling pressures. Right Ventricle: The right ventricular size is normal. No increase in right ventricular wall thickness. Right ventricular systolic function is normal. There is normal pulmonary artery systolic pressure. The tricuspid regurgitant velocity is 2.16 m/s, and  with an assumed right atrial pressure of 3 mmHg, the estimated right ventricular systolic pressure is 77.8 mmHg. Left Atrium: Left atrial size was normal in size. Right Atrium: Right atrial size was normal in size. Pericardium: There is no evidence of pericardial effusion. Mitral Valve: The mitral valve is abnormal. Mild mitral annular calcification. Trivial mitral valve regurgitation. Tricuspid Valve: The tricuspid valve is grossly normal. Tricuspid valve regurgitation is trivial. Aortic Valve: The aortic valve is tricuspid. Aortic valve regurgitation is not visualized. Pulmonic Valve: The pulmonic valve was grossly normal. Pulmonic valve regurgitation is not visualized. Aorta: The aortic root and ascending aorta are structurally normal, with no evidence of dilitation. Venous: The inferior vena cava is normal in size with greater than 50% respiratory variability,  suggesting right atrial pressure of 3 mmHg. IAS/Shunts: No atrial level shunt detected by color flow Doppler.  LEFT VENTRICLE PLAX 2D LVIDd:         4.20 cm  Diastology LVIDs:         2.90 cm  LV e' medial:    4.90 cm/s LV PW:         1.10 cm  LV E/e' medial:  13.5 LV IVS:        1.00 cm  LV e' lateral:   5.66 cm/s LVOT diam:     2.20 cm  LV E/e' lateral: 11.7 LV SV:         70 LV SV Index:   35       2D Longitudinal Strain LVOT Area:     3.80 cm 2D Strain GLS Avg:     -17.9 %  RIGHT VENTRICLE RV S prime:     11.40 cm/s TAPSE (M-mode): 2.0 cm LEFT ATRIUM             Index       RIGHT ATRIUM           Index LA diam:        3.70 cm 1.87 cm/m  RA Area:     12.80 cm LA Vol (A2C):   56.5 ml 28.54 ml/m RA Volume:   25.30 ml  12.78 ml/m LA Vol (A4C):   54.8 ml 27.68 ml/m LA Biplane Vol: 59.6 ml 30.10 ml/m  AORTIC VALVE LVOT Vmax:   85.80 cm/s LVOT Vmean:  55.900 cm/s LVOT VTI:    0.184 m  AORTA Ao Root diam: 2.80 cm MITRAL VALVE               TRICUSPID VALVE MV Area (PHT): 2.17 cm    TR Peak grad:  18.7 mmHg MV Decel Time: 349 msec    TR Vmax:        216.00 cm/s MV E velocity: 66.10 cm/s MV A velocity: 94.30 cm/s  SHUNTS MV E/A ratio:  0.70        Systemic VTI:  0.18 m                            Systemic Diam: 2.20 cm Lyman Bishop MD Electronically signed by Lyman Bishop MD Signature Date/Time: 02/19/2021/11:54:28 AM    Final    IR IMAGING GUIDED PORT INSERTION  Result Date: 02/20/2021 INDICATION: 65 year old female with endometrial cancer in need of durable venous access prior to chemotherapy. EXAM: IMPLANTED PORT A CATH PLACEMENT WITH ULTRASOUND AND FLUOROSCOPIC GUIDANCE MEDICATIONS: None. ANESTHESIA/SEDATION: Versed 3 mg IV; Fentanyl 100 mcg IV; Moderate Sedation Time:  17 minutes The patient was continuously monitored during the procedure by the interventional radiology nurse under my direct supervision. FLUOROSCOPY TIME:  0 minutes, 12 seconds (3 mGy) COMPLICATIONS: None immediate. PROCEDURE: The right  neck and chest was prepped with chlorhexidine, and draped in the usual sterile fashion using maximum barrier technique (cap and mask, sterile gown, sterile gloves, large sterile sheet, hand hygiene and cutaneous antiseptic). Local anesthesia was attained by infiltration with 1% lidocaine with epinephrine. Ultrasound demonstrated patency of the right internal jugular vein, and this was documented with an image. Under real-time ultrasound guidance, this vein was accessed with a 21 gauge micropuncture needle and image documentation was performed. A small dermatotomy was made at the access site with an 11 scalpel. A 0.018" wire was advanced into the SVC and the access needle exchanged for a 74F micropuncture vascular sheath. The 0.018" wire was then removed and a 0.035" wire advanced into the IVC. An appropriate location for the subcutaneous reservoir was selected below the clavicle and an incision was made through the skin and underlying soft tissues. The subcutaneous tissues were then dissected using a combination of blunt and sharp surgical technique and a pocket was formed. A single lumen power injectable portacatheter was then tunneled through the subcutaneous tissues from the pocket to the dermatotomy and the port reservoir placed within the subcutaneous pocket. The venous access site was then serially dilated and a peel away vascular sheath placed over the wire. The wire was removed and the port catheter advanced into position under fluoroscopic guidance. The catheter tip is positioned in the superior cavoatrial junction. This was documented with a spot image. The portacatheter was then tested and found to flush and aspirate well. The port was flushed with saline followed by 100 units/mL heparinized saline. The pocket was then closed in two layers using first subdermal inverted interrupted absorbable sutures followed by a running subcuticular suture. The epidermis was then sealed with Dermabond. The dermatotomy at  the venous access site was also closed with Dermabond. IMPRESSION: Successful placement of a right IJ approach Power Port with ultrasound and fluoroscopic guidance. The catheter is ready for use. Electronically Signed   By: Jacqulynn Cadet M.D.   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  Given these findings she would be at risk for recurrence and recommendations are for adjuvant chemotherapy.  In addition given the significant volume of disease within the pelvic nodal basins was also recommended that the patient received external beam radiation therapy  in addition to vaginal brachytherapy.  These were the recommendations of the multidisciplinary gynecologic oncology conference and I agree with these recommendations.  Today, I talked to the patient and daughter about the findings and work-up thus far.  We discussed the natural history of endometrial cancer and general treatment, highlighting the role of radiotherapy in the management.  We discussed the available radiation techniques, and focused on the details of logistics and delivery.  We reviewed the anticipated acute and late sequelae associated with radiation in this setting.  The patient was encouraged to ask questions that I answered to the best of my ability.  PLAN: The patient will proceed with systemic chemotherapy.  After she has completed 6 cycles of chemotherapy the patient will undergo reimaging to rule out progression.  If no findings of progression the patient will proceed with radiation therapy directed at the pelvic region.  Given the findings of a deeply invasive tumor, serous high-grade, lymphovascular space invasion, I would also recommend vaginal brachytherapy as part of her treatment.  Total time spent in this encounter was 60 minutes which included reviewing the patient's most recent consultations,  follow-ups, total hysterectomy, PAP smear, pathology reports, CT scan of chest/abdomen/pelvis, physical examination, and documentation.   ------------------------------------------------  Blair Promise, PhD, MD  This document serves as a record of services personally performed by Gery Pray, MD. It was created on his behalf by Clerance Lav, a trained medical scribe. The creation of this record is based on the scribe's personal observations and the provider's statements to them. This document has been checked and approved by the attending provider.

## 2021-02-26 ENCOUNTER — Other Ambulatory Visit: Payer: Self-pay

## 2021-02-26 ENCOUNTER — Ambulatory Visit
Admission: RE | Admit: 2021-02-26 | Discharge: 2021-02-26 | Disposition: A | Discharge status: Home / Self Care | Payer: Self-pay | Source: Ambulatory Visit | Attending: Radiation Oncology | Admitting: Radiation Oncology

## 2021-02-26 ENCOUNTER — Inpatient Hospital Stay: Payer: Self-pay

## 2021-02-26 ENCOUNTER — Encounter: Payer: Self-pay | Admitting: Radiation Oncology

## 2021-02-26 ENCOUNTER — Other Ambulatory Visit: Payer: Self-pay | Admitting: Hematology and Oncology

## 2021-02-26 DIAGNOSIS — Z923 Personal history of irradiation: Secondary | ICD-10-CM | POA: Insufficient documentation

## 2021-02-26 DIAGNOSIS — K219 Gastro-esophageal reflux disease without esophagitis: Secondary | ICD-10-CM | POA: Insufficient documentation

## 2021-02-26 DIAGNOSIS — I10 Essential (primary) hypertension: Secondary | ICD-10-CM | POA: Insufficient documentation

## 2021-02-26 DIAGNOSIS — Z800 Family history of malignant neoplasm of digestive organs: Secondary | ICD-10-CM | POA: Insufficient documentation

## 2021-02-26 DIAGNOSIS — E785 Hyperlipidemia, unspecified: Secondary | ICD-10-CM | POA: Insufficient documentation

## 2021-02-26 DIAGNOSIS — Z87891 Personal history of nicotine dependence: Secondary | ICD-10-CM | POA: Insufficient documentation

## 2021-02-26 DIAGNOSIS — Z7982 Long term (current) use of aspirin: Secondary | ICD-10-CM | POA: Insufficient documentation

## 2021-02-26 DIAGNOSIS — E119 Type 2 diabetes mellitus without complications: Secondary | ICD-10-CM | POA: Insufficient documentation

## 2021-02-26 DIAGNOSIS — Z90722 Acquired absence of ovaries, bilateral: Secondary | ICD-10-CM | POA: Insufficient documentation

## 2021-02-26 DIAGNOSIS — C541 Malignant neoplasm of endometrium: Secondary | ICD-10-CM | POA: Insufficient documentation

## 2021-02-26 DIAGNOSIS — E669 Obesity, unspecified: Secondary | ICD-10-CM | POA: Insufficient documentation

## 2021-02-26 DIAGNOSIS — I2510 Atherosclerotic heart disease of native coronary artery without angina pectoris: Secondary | ICD-10-CM | POA: Insufficient documentation

## 2021-02-26 DIAGNOSIS — Z90710 Acquired absence of both cervix and uterus: Secondary | ICD-10-CM | POA: Insufficient documentation

## 2021-02-26 DIAGNOSIS — Z79899 Other long term (current) drug therapy: Secondary | ICD-10-CM | POA: Insufficient documentation

## 2021-02-26 LAB — CMP (CANCER CENTER ONLY)
ALT: 15 U/L (ref 0–44)
AST: 12 U/L — ABNORMAL LOW (ref 15–41)
Albumin: 3.6 g/dL (ref 3.5–5.0)
Alkaline Phosphatase: 105 U/L (ref 38–126)
Anion gap: 8 (ref 5–15)
BUN: 18 mg/dL (ref 8–23)
CO2: 25 mmol/L (ref 22–32)
Calcium: 9.5 mg/dL (ref 8.9–10.3)
Chloride: 108 mmol/L (ref 98–111)
Creatinine: 1.12 mg/dL — ABNORMAL HIGH (ref 0.44–1.00)
GFR, Estimated: 56 mL/min — ABNORMAL LOW (ref >60)
Glucose, Bld: 124 mg/dL — ABNORMAL HIGH (ref 70–99)
Potassium: 3.8 mmol/L (ref 3.5–5.1)
Sodium: 141 mmol/L (ref 135–145)
Total Bilirubin: 0.4 mg/dL (ref 0.3–1.2)
Total Protein: 7.3 g/dL (ref 6.5–8.1)

## 2021-02-26 LAB — CBC WITH DIFFERENTIAL (CANCER CENTER ONLY)
Abs Immature Granulocytes: 0.02 10*3/uL (ref 0.00–0.07)
Basophils Absolute: 0 10*3/uL (ref 0.0–0.1)
Basophils Relative: 0 %
Eosinophils Absolute: 0.2 10*3/uL (ref 0.0–0.5)
Eosinophils Relative: 3 %
HCT: 38.2 % (ref 36.0–46.0)
Hemoglobin: 12.4 g/dL (ref 12.0–15.0)
Immature Granulocytes: 0 %
Lymphocytes Relative: 36 %
Lymphs Abs: 2.6 10*3/uL (ref 0.7–4.0)
MCH: 29.8 pg (ref 26.0–34.0)
MCHC: 32.5 g/dL (ref 30.0–36.0)
MCV: 91.8 fL (ref 80.0–100.0)
Monocytes Absolute: 0.6 10*3/uL (ref 0.1–1.0)
Monocytes Relative: 8 %
Neutro Abs: 3.7 10*3/uL (ref 1.7–7.7)
Neutrophils Relative %: 53 %
Platelet Count: 276 10*3/uL (ref 150–400)
RBC: 4.16 MIL/uL (ref 3.87–5.11)
RDW: 14.1 % (ref 11.5–15.5)
WBC Count: 7.1 10*3/uL (ref 4.0–10.5)
nRBC: 0 % (ref 0.0–0.2)

## 2021-02-26 MED FILL — ?CLOPIDOGREL 75MG TABL: 75 | 30 days supply | Qty: 30 | Fill #1

## 2021-02-26 NOTE — Progress Notes (Signed)
See md visit for nursing evaluation 

## 2021-02-27 ENCOUNTER — Encounter: Payer: Self-pay | Admitting: Hematology and Oncology

## 2021-02-27 ENCOUNTER — Encounter: Payer: Self-pay | Admitting: Licensed Clinical Social Worker

## 2021-02-27 NOTE — Progress Notes (Signed)
Called pt to introduce myself as her Financial Resource Specialist and to discuss the [employer] grant.  I left a msg requesting she return my call if she's interested in applying for the grant.  ?

## 2021-02-27 NOTE — Progress Notes (Signed)
[facility] Psychosocial Distress Screening Clinical Social Work  Clinical Social Work was referred by distress screening protocol.  The patient scored a 8 on the Psychosocial Distress Thermometer which indicates severe distress. Clinical Social Worker spoke with pt's daughter, [name], and patient by phone to assess for distress and other psychosocial needs.   Biggest concern was regarding insurance. CSW had reached out to [employer] who have spoken with patient/ daughter and will work with them on applying for Medicaid.  No other pressing needs at this time.   CSW informed patient of the support team and support services at [facility] including support groups and resources available.  CSW provided contact information and encouraged patient to call with any questions or concerns.  ONCBCN DISTRESS SCREENING 02/26/2021  Screening Type Initial Screening  Distress experienced in past week (1-10) 8  Practical problem type Insurance  Emotional problem type Nervousness/Anxiety;Adjusting to illness  Physical Problem type Sleep/insomnia  Physician notified of physical symptoms Yes  Referral to clinical psychology No  Referral to clinical social work Yes  Referral to dietition No  Referral to financial advocate Yes  Referral to support programs No  Referral to palliative care No    Clinical Social Worker follow up needed: No.  If yes, follow up plan:  [name] E Elizabeth Finch, LCSW

## 2021-02-28 ENCOUNTER — Other Ambulatory Visit: Payer: Self-pay

## 2021-02-28 ENCOUNTER — Encounter: Payer: Self-pay | Admitting: Oncology

## 2021-02-28 ENCOUNTER — Inpatient Hospital Stay: Payer: Self-pay

## 2021-02-28 VITALS — BP 146/81 | HR 83 | Temp 98.4°F | Resp 18 | Wt 206.8 lb

## 2021-02-28 DIAGNOSIS — C541 Malignant neoplasm of endometrium: Secondary | ICD-10-CM

## 2021-02-28 MED ORDER — PALONOSETRON HCL INJECTION 0.25 MG/5ML
0.2500 mg | Freq: Once | INTRAVENOUS | Status: AC
  Administered 2021-02-28: 0.25 mg via INTRAVENOUS

## 2021-02-28 MED ORDER — SODIUM CHLORIDE 0.9% FLUSH
10.0000 mL | INTRAVENOUS | Status: DC | PRN
  Filled 2021-02-28: qty 10

## 2021-02-28 MED ORDER — SODIUM CHLORIDE 0.9 % IV SOLN
150.0000 mg | Freq: Once | INTRAVENOUS | Status: DC
  Filled 2021-02-28: qty 5

## 2021-02-28 MED ORDER — SODIUM CHLORIDE 0.9 % IV SOLN
150.0000 mg | Freq: Once | INTRAVENOUS | Status: AC
  Administered 2021-02-28: 150 mg via INTRAVENOUS
  Filled 2021-02-28: qty 150

## 2021-02-28 MED ORDER — DIPHENHYDRAMINE HCL 50 MG/ML IJ SOLN
INTRAMUSCULAR | Status: AC
  Filled 2021-02-28: qty 1

## 2021-02-28 MED ORDER — SODIUM CHLORIDE 0.9 % IV SOLN
10.0000 mg | Freq: Once | INTRAVENOUS | Status: AC
  Administered 2021-02-28: 10 mg via INTRAVENOUS
  Filled 2021-02-28: qty 10

## 2021-02-28 MED ORDER — HEPARIN SOD (PORK) LOCK FLUSH 100 UNIT/ML IV SOLN
500.0000 [IU] | Freq: Once | INTRAVENOUS | Status: DC | PRN
  Filled 2021-02-28: qty 5

## 2021-02-28 MED ORDER — SODIUM CHLORIDE 0.9 % IV SOLN
10.0000 mg | Freq: Once | INTRAVENOUS | Status: DC
  Filled 2021-02-28: qty 1

## 2021-02-28 MED ORDER — FAMOTIDINE IN NACL 20-0.9 MG/50ML-% IV SOLN
20.0000 mg | Freq: Once | INTRAVENOUS | Status: AC
  Administered 2021-02-28: 20 mg via INTRAVENOUS

## 2021-02-28 MED ORDER — FAMOTIDINE IN NACL 20-0.9 MG/50ML-% IV SOLN
INTRAVENOUS | Status: AC
  Filled 2021-02-28: qty 50

## 2021-02-28 MED ORDER — DIPHENHYDRAMINE HCL 50 MG/ML IJ SOLN
50.0000 mg | Freq: Once | INTRAMUSCULAR | Status: AC
  Administered 2021-02-28: 50 mg via INTRAVENOUS

## 2021-02-28 MED ORDER — SODIUM CHLORIDE 0.9 % IV SOLN
140.0000 mg/m2 | Freq: Once | INTRAVENOUS | Status: AC
  Administered 2021-02-28: 294 mg via INTRAVENOUS
  Filled 2021-02-28: qty 49

## 2021-02-28 MED ORDER — SODIUM CHLORIDE 0.9 % IV SOLN
Freq: Once | INTRAVENOUS | Status: AC
  Filled 2021-02-28: qty 250

## 2021-02-28 MED ORDER — PALONOSETRON HCL INJECTION 0.25 MG/5ML
INTRAVENOUS | Status: AC
  Filled 2021-02-28: qty 5

## 2021-02-28 MED ORDER — CARBOPLATIN CHEMO INJECTION 600 MG/60ML
530.0000 mg | Freq: Once | INTRAVENOUS | Status: AC
  Administered 2021-02-28: 530 mg via INTRAVENOUS
  Filled 2021-02-28: qty 53

## 2021-02-28 NOTE — Patient Instructions (Signed)
Cancer Center Discharge Instructions for Patients Receiving Chemotherapy  Today you received the following chemotherapy agents Paclitaxel(Taxol), Carboplatin.  To help prevent nausea and vomiting after your treatment, we encourage you to take your nausea medication as directed.   If you develop nausea and vomiting that is not controlled by your nausea medication, call the clinic.   BELOW ARE SYMPTOMS THAT SHOULD BE REPORTED IMMEDIATELY:  *FEVER GREATER THAN 100.5 F  *CHILLS WITH OR WITHOUT FEVER  NAUSEA AND VOMITING THAT IS NOT CONTROLLED WITH YOUR NAUSEA MEDICATION  *UNUSUAL SHORTNESS OF BREATH  *UNUSUAL BRUISING OR BLEEDING  TENDERNESS IN MOUTH AND THROAT WITH OR WITHOUT PRESENCE OF ULCERS  *URINARY PROBLEMS  *BOWEL PROBLEMS  UNUSUAL RASH Items with * indicate a potential emergency and should be followed up as soon as possible.  Feel free to call the clinic should you have any questions or concerns. The clinic phone number is [phone].  Please show the CHEMO ALERT CARD at check-in to the Emergency Department and triage nurse.   

## 2021-02-28 NOTE — Progress Notes (Signed)
Met with [name] during her first infusion.  She does not have any questions or needs at this time.  Encouraged her to call if she does have questions.

## 2021-03-07 ENCOUNTER — Other Ambulatory Visit: Payer: Self-pay

## 2021-03-07 ENCOUNTER — Encounter: Payer: Self-pay | Admitting: Hematology and Oncology

## 2021-03-07 ENCOUNTER — Inpatient Hospital Stay (HOSPITAL_BASED_OUTPATIENT_CLINIC_OR_DEPARTMENT_OTHER): Payer: Self-pay | Admitting: Hematology and Oncology

## 2021-03-07 ENCOUNTER — Other Ambulatory Visit: Payer: Self-pay | Admitting: Hematology and Oncology

## 2021-03-07 DIAGNOSIS — E669 Obesity, unspecified: Secondary | ICD-10-CM

## 2021-03-07 DIAGNOSIS — M25562 Pain in left knee: Secondary | ICD-10-CM

## 2021-03-07 DIAGNOSIS — C541 Malignant neoplasm of endometrium: Secondary | ICD-10-CM

## 2021-03-07 DIAGNOSIS — M25561 Pain in right knee: Secondary | ICD-10-CM

## 2021-03-07 DIAGNOSIS — E1169 Type 2 diabetes mellitus with other specified complication: Secondary | ICD-10-CM

## 2021-03-07 DIAGNOSIS — I10 Essential (primary) hypertension: Secondary | ICD-10-CM

## 2021-03-07 DIAGNOSIS — M25569 Pain in unspecified knee: Secondary | ICD-10-CM | POA: Insufficient documentation

## 2021-03-07 MED ORDER — DOXAZOSIN MESYLATE 2 MG PO TABS: 2.0000 mg | ORAL_TABLET | Freq: Every day | ORAL | 1 refills | Status: DC

## 2021-03-07 MED FILL — DOXAZOSIN MESYLATE 2 MG TAB: 2 | 30 days supply | Qty: 30 | Fill #0

## 2021-03-07 NOTE — Assessment & Plan Note (Signed)
She has mild lower extremity joint pain after treatment which is not unexpected I recommend she takes over-the-counter acetaminophen as needed

## 2021-03-07 NOTE — Assessment & Plan Note (Signed)
Overall, she tolerated recent treatment well except for occasional joint pain and loose stool Her blood pressure control is suboptimal I will see her next month for further follow-up

## 2021-03-07 NOTE — Progress Notes (Signed)
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[place]) Overall, she tolerated recent treatment well except for occasional joint pain and loose stool Her blood pressure control is suboptimal I will see her next month for further follow-up  Essential hypertension She has poorly controlled hypertension but no recent signs of congestive heart failure I recommend her to check her blood pressure twice a day and we will add Cardura  Diabetes mellitus type 2 in obese ([place]) Her blood sugar control at home is satisfactory She will continue to monitor her diet closely  Joint pain, knee She has mild lower extremity joint pain after treatment which is not unexpected I recommend she takes over-the-counter acetaminophen as needed   No orders of the defined types were placed in this encounter.   INTERVAL HISTORY: Please see below for problem oriented charting. The purpose of today's visit is for symptom management after recent chemotherapy Her daughter is present After treatment, she has some joint pain starting around day 3 She started to have diarrhea this last 2 days and took Imodium She has not been checking her blood pressure consistently Her blood sugar readings over the past 7 days are good, ranging between 10 4-1 34 In terms of her blood pressure, consistently, her systolic blood pressure is high, on average around 150 but has been as high as 175 I verify with the patient she is taking several different blood pressure medications including furosemide, metoprolol and lisinopril  SUMMARY OF ONCOLOGIC HISTORY: Oncology History Overview Note  Pap 12/14/20: adenocarcinoma, HPV negative  HER-2 positive by FISH Her-2 equivocal by 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 ENDOCERVIX, CURETTAGE:  - Adenocarcinoma.  B. ENDOMETRIUM, BIOPSY:  - Adenocarcinoma.  COMMENT:  The differential diagnosis includes high grade serous carcinoma.  Both  specimens have a similar morphology; high grade serous carcinoma more  commonly arises in the endometrium.  Results reported to Dr. [place] Bogus  on 01/15/2021.  Dr. Saralyn Pilar reviewed the case.    01/16/2021 Tumor Marker   Patient's tumor was tested for the following markers: CA-125 Results of the tumor marker test revealed normal value, 10.7   01/25/2021 Imaging   CT C/A/P: 1. Small volume fluid in the endometrial canal with 7 mm hypoattenuating lesion in the myometrium of the anterior fundus. 2. No definite evidence for metastatic disease in the chest, abdomen, or pelvis. Small lymph nodes are noted along both pelvic sidewalls. Attention on follow-up recommended. 3. 5 mm ground-glass opacity peripheral left upper lobe. This is likely related to infection/inflammation and potentially scar. Attention on surveillance imaging recommended. 4. 5.8 cm lesion posterior lower uterine segment likely a fibroid. Ultrasound exam from 01/28/2010 demonstrated a 5.2 cm lesion in the left aspect of the posterior lower uterine body. 5. Aortic Atherosclerosis (ICD10-I70.0).   01/29/2021 Surgery   TRH/BSO, SLN biopsy left, selective pelvic LND right, omentectomy  Findings: On EUA, 10-12cm enlarged mobile uterus. On intra-abdomina entry, minimal adhesions between the liver and anterior abdomen on the right. Some changes c/w fatty liver on the left. Omentum, stomach, small and large bowel all grossly normal. Bilateral ovaries normal appearing. Uterus with 5-6cm posterior fibroid, otherwise normal appearing.  No mapping to right pelvis. On left, mapping to the level of the superior vessel artery,  enlarged lymph node noted (likely sentinel) within the upper aspect of the obturator space. In bilateral pelvic basins, multiple enlarged lymph nodes. On the right, enlarged lymph nodes extended superiorly along the common iliac vessels. At the end of surgery, no obvious abdominal or pelvic evidence of disease.   01/29/2021 Pathology Results   A. SENTINEL LYMPH NODE, LEFT INTERNAL ILIAC, BIOPSY:  - Metastatic carcinoma in (1) of (1) lymph node.   B. SENTINEL LYMPH NODE, LEFT OBTURATOR, BIOPSY:  - Metastatic carcinoma in (1) of (1) lymph node.   C. LYMPH NODES, LEFT PELVIC, DISSECTION:  - Metastatic carcinoma in (1) of (3) lymph nodes.   D. LYMPH NODE, RIGHT EXTERNAL AND COMMON ILIAC, BIOPSY:  - Metastatic carcinoma in (1) of (1) lymph node.   E. UTERUS, CERVIX AND BILATERAL FALLOPIAN TUBES AND OVARIES, TOTAL  HYSTERECTOMY AND BILATERAL SALPINGO-OOPHORECTOMY:  - High grade serous carcinoma of endometrium, with invasion more than  half of the myometrium.  - Tumor invades the stromal connective tissue of the cervix.  - No involvement of uterine serosa or adnexa.  - Lymphovascular invasion is identified.  - See oncology table.   F. OMENTUM, OMENTECTOMY:  - Omentum, negative for carcinoma.   G. LYMPH NODES, RIGHT PELVIC, DISSECTION:  - Two lymph nodes, negative for carcinoma (0/2).   ONCOLOGY TABLE:   UTERUS, CARCINOMA OR CARCINOSARCOMA: Resection   Procedure: Total hysterectomy and bilateral salpingo-oophorectomy,  Omentectomy, Lymph node sampling  Histologic Type: Serous carcinoma  Histologic Grade: High grade  Myometrial Invasion: > 50%  Uterine Serosa Involvement: Not identified  Cervical Stroma Involvement: Present  Other Tissue/Organ Involvement: Not identified  Peritoneal/Ascitic Fluid: Negative for carcinoma  Lymphovascular Invasion: Present  Regional Lymph Nodes:       Pelvic Lymph Nodes Examined:            2 Sentinel            6 Non-Sentinel            8 Total        Pelvic Lymph Nodes with Metastasis: 4            Macrometastasis: 3            Micrometastasis: 1            Isolated Tumor Cells: 0            Laterality of Lymph Nodes with Tumor: Right (non-sentinel),  Left (sentinel and non-sentinel)            Extracapsular Extension: Present       Para-Aortic Lymph Nodes Examined: Not applicable        Para-Aortic Lymph Nodes with Metastasis: Not applicable  Distant Metastasis:       Distant Site(s) Involved: Omentum: Not involved  Pathologic Stage Classification (pTNM, AJCC 8th Edition): pT2, pN1a  Ancillary Studies: HER2 will be ordered  Additional Findings: Leiomyomata.  Adenomyosis.  Representative Tumor Block: B1  Comment: Dr. Saralyn Pilar reviewed select slides.  (v4.2.0.1)    01/29/2021 Cancer Staging   Staging form: Corpus Uteri - Carcinoma and Carcinosarcoma, AJCC 8th Edition - Clinical stage from 01/29/2021: FIGO Stage IIIC1 (cT1b, cN1a, cM0) - Signed by Lafonda Mosses, MD on 02/02/2021 Histopathologic type: Mixed cell adenocarcinoma Stage prefix: Initial diagnosis Method of lymph node assessment: Other Histologic grade (G): G3 Histologic grading system: 3 grade system Lymph-vascular invasion (LVI): LVI present/identified, NOS  Peritoneal cytology results: Negative Pelvic nodal status: Positive Number of pelvic nodes positive from dissection: 4 Number of pelvic nodes examined during dissection: 8 Para-aortic status: Not assessed Lymph node metastasis: Present Omentectomy performed: Yes Morcellation performed: No   02/19/2021 Echocardiogram    1. Left ventricular ejection fraction, by estimation, is 55 to 60%. The left ventricle has normal function. The left ventricle demonstrates regional wall motion abnormalities (see scoring diagram/findings for description). There is mild left ventricular  hypertrophy. Left ventricular diastolic parameters are consistent with Grade I diastolic dysfunction (impaired relaxation). There is  moderate hypokinesis of the left ventricular, basal septal wall and inferior wall. The average left ventricular global longitudinal strain is -17.9 %. The global longitudinal strain is abnormal.  2. Right ventricular systolic function is normal. The right ventricular size is normal. There is normal pulmonary artery systolic pressure. The estimated right ventricular systolic pressure is 93.2 mmHg.  3. The mitral valve is abnormal. Trivial mitral valve regurgitation.  4. The aortic valve is tricuspid. Aortic valve regurgitation is not visualized.  5. The inferior vena cava is normal in size with greater than 50% respiratory variability, suggesting right atrial pressure of 3 mmHg.   02/20/2021 Procedure   Successful placement of a right IJ approach Power Port with ultrasound and fluoroscopic guidance. The catheter is ready for use.   02/28/2021 -  Chemotherapy    Patient is on Treatment Plan: UTERINE CARBOPLATIN AUC 6 / PACLITAXEL Q21D        REVIEW OF SYSTEMS:   Constitutional: Denies fevers, chills or abnormal weight loss Eyes: Denies blurriness of vision Ears, nose, mouth, throat, and face: Denies mucositis or sore throat Respiratory: Denies cough, dyspnea or wheezes Cardiovascular: Denies palpitation, chest discomfort Skin: Denies abnormal skin rashes Lymphatics: Denies new lymphadenopathy or easy bruising Neurological:Denies numbness, tingling or new weaknesses Behavioral/Psych: Mood is stable, no new changes  Extremities: No lower extremity edema All other systems were reviewed with the patient and are negative.  I have reviewed the past medical history, past surgical history, social history and family history with the patient and they are unchanged from previous note.  ALLERGIES:  has no allergies on file.  MEDICATIONS:  Current Outpatient Medications  Medication Sig Dispense Refill  . doxazosin (CARDURA) 2 MG tablet Take 1 tablet (2 mg total) by mouth daily. 30 tablet 1  .  acetaminophen (TYLENOL) 500 MG tablet Take 1 tablet (500 mg total) by mouth every 6 (six) hours as needed. 30 tablet 0  . aspirin EC 81 MG EC tablet Take 1 tablet (81 mg total) by mouth daily.    Marland Kitchen atorvastatin (LIPITOR) 80 MG tablet TAKE 1 TABLET (80 MG TOTAL) BY MOUTH AT BEDTIME. 30 tablet 6  . bismuth subsalicylate (PEPTO BISMOL) 262 MG/15ML suspension Take 30 mLs by mouth every 6 (six) hours as needed for indigestion or diarrhea or loose stools.    Marland Kitchen dexamethasone (DECADRON) 4 MG tablet Take 2 tabs at the night before chemotherapy, every 3 weeks, by mouth x 6 cycles 12 tablet 6  . Dulaglutide (TRULICITY) 1.5 IZ/1.2WP SOPN Inject 1.5 mg into the skin once a week. (Patient taking differently: Inject 1.5 mg into the skin every Friday.) 4 pen 3  . furosemide (LASIX) 20 MG tablet TAKE 2 TABLETS (40 MG TOTAL) BY MOUTH DAILY. (Patient taking differently: Take 40 mg by mouth daily.) 60 tablet 2  . glimepiride (AMARYL) 4 MG tablet TAKE 2 TABLETS (8 MG TOTAL) BY MOUTH DAILY WITH BREAKFAST. 60 tablet  6  . Insulin Pen Needle (TRUEPLUS PEN NEEDLES) 32G X 4 MM MISC Use as directed to inject victoza once daily (Patient not taking: Reported on 02/26/2021) 100 each 3  . isosorbide mononitrate (IMDUR) 30 MG 24 hr tablet TAKE 2 TABLETS (60 MG TOTAL) BY MOUTH DAILY. 60 tablet 1  . lidocaine-prilocaine (EMLA) cream Apply to affected area once 30 g 3  . lisinopril (PRINIVIL) 20 MG tablet Take 1 tablet (20 mg total) by mouth daily. 30 tablet 9  . metFORMIN (GLUCOPHAGE) 500 MG tablet TAKE 2 TABLETS (1,000 MG TOTAL) BY MOUTH 2 (TWO) TIMES DAILY WITH A MEAL. 120 tablet 1  . metoprolol tartrate (LOPRESSOR) 100 MG tablet TAKE 1 TABLET (100 MG TOTAL) BY MOUTH 2 (TWO) TIMES DAILY. 60 tablet 6  . nitroGLYCERIN (NITROSTAT) 0.4 MG SL tablet Place 1 tablet (0.4 mg total) under the tongue every 5 (five) minutes as needed for chest pain. (Patient not taking: Reported on 02/26/2021) 25 tablet 2  . ondansetron (ZOFRAN) 8 MG tablet Take  1 tablet (8 mg total) by mouth every 8 (eight) hours as needed. (Patient not taking: Reported on 02/26/2021) 30 tablet 1  . pantoprazole (PROTONIX) 40 MG tablet TAKE 1 TABLET (40 MG TOTAL) BY MOUTH DAILY. (Patient not taking: Reported on 02/26/2021) 30 tablet 6  . prochlorperazine (COMPAZINE) 10 MG tablet Take 1 tablet (10 mg total) by mouth every 6 (six) hours as needed (Nausea or vomiting). (Patient not taking: Reported on 02/26/2021) 30 tablet 1  . senna-docusate (SENOKOT-S) 8.6-50 MG tablet Take 2 tablets by mouth at bedtime. For AFTER surgery, do not take if having diarrhea 30 tablet 0  . traMADol (ULTRAM) 50 MG tablet Take 1 tablet (50 mg total) by mouth every 6 (six) hours as needed for severe pain. For AFTER surgery only, do not take and drive 10 tablet 0   No current facility-administered medications for this visit.    PHYSICAL EXAMINATION: ECOG PERFORMANCE STATUS: 1 - Symptomatic but completely ambulatory  LABORATORY DATA:  I have reviewed the data as listed CMP Latest Ref Rng & Units 02/26/2021 01/24/2021 01/16/2021  Glucose 70 - 99 mg/dL 124(H) 223(H) 154(H)  BUN 8 - 23 mg/dL '18 20 18  ' Creatinine 0.44 - 1.00 mg/dL 1.12(H) 1.11(H) 1.18(H)  Sodium 135 - 145 mmol/L 141 141 141  Potassium 3.5 - 5.1 mmol/L 3.8 3.5 3.5  Chloride 98 - 111 mmol/L 108 104 103  CO2 22 - 32 mmol/L '25 24 28  ' Calcium 8.9 - 10.3 mg/dL 9.5 9.4 9.6  Total Protein 6.5 - 8.1 g/dL 7.3 - 7.3  Total Bilirubin 0.3 - 1.2 mg/dL 0.4 - 0.4  Alkaline Phos 38 - 126 U/L 105 - 100  AST 15 - 41 U/L 12(L) - 17  ALT 0 - 44 U/L 15 - 16    Lab Results  Component Value Date   WBC 7.1 02/26/2021   HGB 12.4 02/26/2021   HCT 38.2 02/26/2021   MCV 91.8 02/26/2021   PLT 276 02/26/2021   NEUTROABS 3.7 02/26/2021     RADIOGRAPHIC STUDIES: I have personally reviewed the radiological images as listed and agreed with the findings in the report. ECHOCARDIOGRAM COMPLETE  Result Date: 02/19/2021    ECHOCARDIOGRAM REPORT   Patient  Name:   [name] Date of Exam: 02/19/2021 Medical Rec #:  [id number]     Height:       62.0 in Accession #:    [id number]    Weight:  217.2 lb Date of Birth:  05/30/1956    BSA:          1.980 m Patient Age:    65 years      BP:           161/72 mmHg Patient Gender: F             HR:           67 bpm. Exam Location:  Outpatient Procedure: 2D Echo Indications:    Chemo Z09  History:        Patient has prior history of Echocardiogram examinations, most                 recent 06/19/2019. Risk Factors:Hypertension, Diabetes and                 Dyslipidemia.  Sonographer:    Mikki Santee RDCS (AE) Referring Phys: [phone] Deano Tomaszewski [place]  1. Left ventricular ejection fraction, by estimation, is 55 to 60%. The left ventricle has normal function. The left ventricle demonstrates regional wall motion abnormalities (see scoring diagram/findings for description). There is mild left ventricular  hypertrophy. Left ventricular diastolic parameters are consistent with Grade I diastolic dysfunction (impaired relaxation). There is moderate hypokinesis of the left ventricular, basal septal wall and inferior wall. The average left ventricular global longitudinal strain is -17.9 %. The global longitudinal strain is abnormal.  2. Right ventricular systolic function is normal. The right ventricular size is normal. There is normal pulmonary artery systolic pressure. The estimated right ventricular systolic pressure is 00.8 mmHg.  3. The mitral valve is abnormal. Trivial mitral valve regurgitation.  4. The aortic valve is tricuspid. Aortic valve regurgitation is not visualized.  5. The inferior vena cava is normal in size with greater than 50% respiratory variability, suggesting right atrial pressure of 3 mmHg. Comparison(s): Changes from prior study are noted. 06/19/2019: LVEF >65%, mild LVH, mild biatrial enlargement, RVSP 46 mmHg. FINDINGS  Left Ventricle: Left ventricular ejection fraction, by estimation, is 55 to 60%.  The left ventricle has normal function. The left ventricle demonstrates regional wall motion abnormalities. Moderate hypokinesis of the left ventricular, basal septal wall and inferior wall. The average left ventricular global longitudinal strain is -17.9 %. The global longitudinal strain is abnormal. The left ventricular internal cavity size was normal in size. There is mild left ventricular hypertrophy. Left ventricular diastolic parameters are consistent with Grade I diastolic dysfunction (impaired relaxation). Indeterminate filling pressures. Right Ventricle: The right ventricular size is normal. No increase in right ventricular wall thickness. Right ventricular systolic function is normal. There is normal pulmonary artery systolic pressure. The tricuspid regurgitant velocity is 2.16 m/s, and  with an assumed right atrial pressure of 3 mmHg, the estimated right ventricular systolic pressure is 67.6 mmHg. Left Atrium: Left atrial size was normal in size. Right Atrium: Right atrial size was normal in size. Pericardium: There is no evidence of pericardial effusion. Mitral Valve: The mitral valve is abnormal. Mild mitral annular calcification. Trivial mitral valve regurgitation. Tricuspid Valve: The tricuspid valve is grossly normal. Tricuspid valve regurgitation is trivial. Aortic Valve: The aortic valve is tricuspid. Aortic valve regurgitation is not visualized. Pulmonic Valve: The pulmonic valve was grossly normal. Pulmonic valve regurgitation is not visualized. Aorta: The aortic root and ascending aorta are structurally normal, with no evidence of dilitation. Venous: The inferior vena cava is normal in size with greater than 50% respiratory variability, suggesting right atrial pressure of 3 mmHg. IAS/Shunts: No atrial  level shunt detected by color flow Doppler.  LEFT VENTRICLE PLAX 2D LVIDd:         4.20 cm  Diastology LVIDs:         2.90 cm  LV e' medial:    4.90 cm/s LV PW:         1.10 cm  LV E/e' medial:   13.5 LV IVS:        1.00 cm  LV e' lateral:   5.66 cm/s LVOT diam:     2.20 cm  LV E/e' lateral: 11.7 LV SV:         70 LV SV Index:   35       2D Longitudinal Strain LVOT Area:     3.80 cm 2D Strain GLS Avg:     -17.9 %  RIGHT VENTRICLE RV S prime:     11.40 cm/s TAPSE (M-mode): 2.0 cm LEFT ATRIUM             Index       RIGHT ATRIUM           Index LA diam:        3.70 cm 1.87 cm/m  RA Area:     12.80 cm LA Vol (A2C):   56.5 ml 28.54 ml/m RA Volume:   25.30 ml  12.78 ml/m LA Vol (A4C):   54.8 ml 27.68 ml/m LA Biplane Vol: 59.6 ml 30.10 ml/m  AORTIC VALVE LVOT Vmax:   85.80 cm/s LVOT Vmean:  55.900 cm/s LVOT VTI:    0.184 m  AORTA Ao Root diam: 2.80 cm MITRAL VALVE               TRICUSPID VALVE MV Area (PHT): 2.17 cm    TR Peak grad:   18.7 mmHg MV Decel Time: 349 msec    TR Vmax:        216.00 cm/s MV E velocity: 66.10 cm/s MV A velocity: 94.30 cm/s  SHUNTS MV E/A ratio:  0.70        Systemic VTI:  0.18 m                            Systemic Diam: 2.20 cm Lyman Bishop MD Electronically signed by Lyman Bishop MD Signature Date/Time: 02/19/2021/11:54:28 AM    Final    IR IMAGING GUIDED PORT INSERTION  Result Date: 02/20/2021 INDICATION: 66 year old female with endometrial cancer in need of durable venous access prior to chemotherapy. EXAM: IMPLANTED PORT A CATH PLACEMENT WITH ULTRASOUND AND FLUOROSCOPIC GUIDANCE MEDICATIONS: None. ANESTHESIA/SEDATION: Versed 3 mg IV; Fentanyl 100 mcg IV; Moderate Sedation Time:  17 minutes The patient was continuously monitored during the procedure by the interventional radiology nurse under my direct supervision. FLUOROSCOPY TIME:  0 minutes, 12 seconds (3 mGy) COMPLICATIONS: None immediate. PROCEDURE: The right neck and chest was prepped with chlorhexidine, and draped in the usual sterile fashion using maximum barrier technique (cap and mask, sterile gown, sterile gloves, large sterile sheet, hand hygiene and cutaneous antiseptic). Local anesthesia was attained by  infiltration with 1% lidocaine with epinephrine. Ultrasound demonstrated patency of the right internal jugular vein, and this was documented with an image. Under real-time ultrasound guidance, this vein was accessed with a 21 gauge micropuncture needle and image documentation was performed. A small dermatotomy was made at the access site with an 11 scalpel. A 0.018" wire was advanced into the SVC and the access needle exchanged for a 23F micropuncture  vascular sheath. The 0.018" wire was then removed and a 0.035" wire advanced into the IVC. An appropriate location for the subcutaneous reservoir was selected below the clavicle and an incision was made through the skin and underlying soft tissues. The subcutaneous tissues were then dissected using a combination of blunt and sharp surgical technique and a pocket was formed. A single lumen power injectable portacatheter was then tunneled through the subcutaneous tissues from the pocket to the dermatotomy and the port reservoir placed within the subcutaneous pocket. The venous access site was then serially dilated and a peel away vascular sheath placed over the wire. The wire was removed and the port catheter advanced into position under fluoroscopic guidance. The catheter tip is positioned in the superior cavoatrial junction. This was documented with a spot image. The portacatheter was then tested and found to flush and aspirate well. The port was flushed with saline followed by 100 units/mL heparinized saline. The pocket was then closed in two layers using first subdermal inverted interrupted absorbable sutures followed by a running subcuticular suture. The epidermis was then sealed with Dermabond. The dermatotomy at the venous access site was also closed with Dermabond. IMPRESSION: Successful placement of a right IJ approach Power Port with ultrasound and fluoroscopic guidance. The catheter is ready for use. Electronically Signed   By: Jacqulynn Cadet M.D.   On:  02/20/2021 16:22    I discussed the assessment and treatment plan with the patient. The patient was provided an opportunity to ask questions and all were answered. The patient agreed with the plan and demonstrated an understanding of the instructions. The patient was advised to call back or seek an in-person evaluation if the symptoms worsen or if the condition fails to improve as anticipated.    I spent 20 minutes for the appointment reviewing test results, discuss management and coordination of care.  Heath Lark, MD 03/07/2021 11:53 AM

## 2021-03-07 NOTE — Assessment & Plan Note (Signed)
She has poorly controlled hypertension but no recent signs of congestive heart failure I recommend her to check her blood pressure twice a day and we will add Cardura

## 2021-03-07 NOTE — Assessment & Plan Note (Signed)
Her blood sugar control at home is satisfactory She will continue to monitor her diet closely

## 2021-03-21 ENCOUNTER — Other Ambulatory Visit (HOSPITAL_COMMUNITY): Payer: Self-pay

## 2021-03-21 MED FILL — Lisinopril Tab 20 MG: ORAL | 30 days supply | Qty: 30 | Fill #0 | Status: AC

## 2021-03-21 MED FILL — Dexamethasone Sodium Phosphate Inj 100 MG/10ML: INTRAMUSCULAR | Qty: 1 | Status: AC

## 2021-03-22 ENCOUNTER — Encounter: Payer: Self-pay | Admitting: Hematology and Oncology

## 2021-03-22 ENCOUNTER — Inpatient Hospital Stay: Payer: Self-pay | Attending: Gynecologic Oncology

## 2021-03-22 ENCOUNTER — Other Ambulatory Visit (HOSPITAL_COMMUNITY): Payer: Self-pay

## 2021-03-22 ENCOUNTER — Inpatient Hospital Stay: Payer: Self-pay

## 2021-03-22 ENCOUNTER — Inpatient Hospital Stay (HOSPITAL_BASED_OUTPATIENT_CLINIC_OR_DEPARTMENT_OTHER): Payer: Self-pay | Admitting: Hematology and Oncology

## 2021-03-22 ENCOUNTER — Telehealth: Payer: Self-pay | Admitting: Hematology and Oncology

## 2021-03-22 ENCOUNTER — Other Ambulatory Visit: Payer: Self-pay

## 2021-03-22 DIAGNOSIS — Z79899 Other long term (current) drug therapy: Secondary | ICD-10-CM | POA: Insufficient documentation

## 2021-03-22 DIAGNOSIS — R110 Nausea: Secondary | ICD-10-CM | POA: Insufficient documentation

## 2021-03-22 DIAGNOSIS — I10 Essential (primary) hypertension: Secondary | ICD-10-CM

## 2021-03-22 DIAGNOSIS — E1169 Type 2 diabetes mellitus with other specified complication: Secondary | ICD-10-CM

## 2021-03-22 DIAGNOSIS — Z90722 Acquired absence of ovaries, bilateral: Secondary | ICD-10-CM | POA: Insufficient documentation

## 2021-03-22 DIAGNOSIS — Z90710 Acquired absence of both cervix and uterus: Secondary | ICD-10-CM | POA: Insufficient documentation

## 2021-03-22 DIAGNOSIS — N183 Chronic kidney disease, stage 3 unspecified: Secondary | ICD-10-CM | POA: Insufficient documentation

## 2021-03-22 DIAGNOSIS — E1122 Type 2 diabetes mellitus with diabetic chronic kidney disease: Secondary | ICD-10-CM | POA: Insufficient documentation

## 2021-03-22 DIAGNOSIS — N1831 Chronic kidney disease, stage 3a: Secondary | ICD-10-CM | POA: Insufficient documentation

## 2021-03-22 DIAGNOSIS — C541 Malignant neoplasm of endometrium: Secondary | ICD-10-CM

## 2021-03-22 DIAGNOSIS — E669 Obesity, unspecified: Secondary | ICD-10-CM

## 2021-03-22 DIAGNOSIS — Z794 Long term (current) use of insulin: Secondary | ICD-10-CM | POA: Insufficient documentation

## 2021-03-22 DIAGNOSIS — Z7982 Long term (current) use of aspirin: Secondary | ICD-10-CM | POA: Insufficient documentation

## 2021-03-22 DIAGNOSIS — I5032 Chronic diastolic (congestive) heart failure: Secondary | ICD-10-CM

## 2021-03-22 DIAGNOSIS — I130 Hypertensive heart and chronic kidney disease with heart failure and stage 1 through stage 4 chronic kidney disease, or unspecified chronic kidney disease: Secondary | ICD-10-CM | POA: Insufficient documentation

## 2021-03-22 DIAGNOSIS — Z5111 Encounter for antineoplastic chemotherapy: Secondary | ICD-10-CM | POA: Insufficient documentation

## 2021-03-22 LAB — CBC WITH DIFFERENTIAL (CANCER CENTER ONLY)
Abs Immature Granulocytes: 0.02 10*3/uL (ref 0.00–0.07)
Basophils Absolute: 0 10*3/uL (ref 0.0–0.1)
Basophils Relative: 0 %
Eosinophils Absolute: 0 10*3/uL (ref 0.0–0.5)
Eosinophils Relative: 0 %
HCT: 33.9 % — ABNORMAL LOW (ref 36.0–46.0)
Hemoglobin: 11.3 g/dL — ABNORMAL LOW (ref 12.0–15.0)
Immature Granulocytes: 0 %
Lymphocytes Relative: 21 %
Lymphs Abs: 1.1 10*3/uL (ref 0.7–4.0)
MCH: 30.1 pg (ref 26.0–34.0)
MCHC: 33.3 g/dL (ref 30.0–36.0)
MCV: 90.2 fL (ref 80.0–100.0)
Monocytes Absolute: 0.1 10*3/uL (ref 0.1–1.0)
Monocytes Relative: 3 %
Neutro Abs: 3.9 10*3/uL (ref 1.7–7.7)
Neutrophils Relative %: 76 %
Platelet Count: 211 10*3/uL (ref 150–400)
RBC: 3.76 MIL/uL — ABNORMAL LOW (ref 3.87–5.11)
RDW: 13.7 % (ref 11.5–15.5)
WBC Count: 5.2 10*3/uL (ref 4.0–10.5)
nRBC: 0 % (ref 0.0–0.2)

## 2021-03-22 LAB — CMP (CANCER CENTER ONLY)
ALT: 16 U/L (ref 0–44)
AST: 12 U/L — ABNORMAL LOW (ref 15–41)
Albumin: 3.5 g/dL (ref 3.5–5.0)
Alkaline Phosphatase: 105 U/L (ref 38–126)
Anion gap: 16 — ABNORMAL HIGH (ref 5–15)
BUN: 29 mg/dL — ABNORMAL HIGH (ref 8–23)
CO2: 19 mmol/L — ABNORMAL LOW (ref 22–32)
Calcium: 8.7 mg/dL — ABNORMAL LOW (ref 8.9–10.3)
Chloride: 107 mmol/L (ref 98–111)
Creatinine: 1.3 mg/dL — ABNORMAL HIGH (ref 0.44–1.00)
GFR, Estimated: 46 mL/min — ABNORMAL LOW (ref >60)
Glucose, Bld: 250 mg/dL — ABNORMAL HIGH (ref 70–99)
Potassium: 4 mmol/L (ref 3.5–5.1)
Sodium: 142 mmol/L (ref 135–145)
Total Bilirubin: 0.3 mg/dL (ref 0.3–1.2)
Total Protein: 7 g/dL (ref 6.5–8.1)

## 2021-03-22 MED ORDER — DEXAMETHASONE SODIUM PHOSPHATE 10 MG/ML IJ SOLN
INTRAMUSCULAR | Status: AC
  Filled 2021-03-22: qty 1

## 2021-03-22 MED ORDER — INSULIN ASPART 100 UNIT/ML ~~LOC~~ SOLN
SUBCUTANEOUS | Status: AC
  Filled 2021-03-22: qty 1

## 2021-03-22 MED ORDER — SODIUM CHLORIDE 0.9 % IV SOLN: 5.0000 mg | Freq: Once | INTRAVENOUS | Status: DC

## 2021-03-22 MED ORDER — SODIUM CHLORIDE 0.9% FLUSH
10.0000 mL | Freq: Once | INTRAVENOUS | Status: AC
  Administered 2021-03-22: 10 mL
  Filled 2021-03-22: qty 10

## 2021-03-22 MED ORDER — DIPHENHYDRAMINE HCL 50 MG/ML IJ SOLN
INTRAMUSCULAR | Status: AC
  Filled 2021-03-22: qty 1

## 2021-03-22 MED ORDER — HEPARIN SOD (PORK) LOCK FLUSH 100 UNIT/ML IV SOLN
500.0000 [IU] | Freq: Once | INTRAVENOUS | Status: AC | PRN
  Administered 2021-03-22: 500 [IU]
  Filled 2021-03-22: qty 5

## 2021-03-22 MED ORDER — SODIUM CHLORIDE 0.9 % IV SOLN
140.0000 mg/m2 | Freq: Once | INTRAVENOUS | Status: AC
  Administered 2021-03-22: 288 mg via INTRAVENOUS
  Filled 2021-03-22: qty 48

## 2021-03-22 MED ORDER — PALONOSETRON HCL INJECTION 0.25 MG/5ML
0.2500 mg | Freq: Once | INTRAVENOUS | Status: AC
  Administered 2021-03-22: 0.25 mg via INTRAVENOUS

## 2021-03-22 MED ORDER — SODIUM CHLORIDE 0.9 % IV SOLN
464.5000 mg | Freq: Once | INTRAVENOUS | Status: AC
  Administered 2021-03-22: 460 mg via INTRAVENOUS
  Filled 2021-03-22: qty 46

## 2021-03-22 MED ORDER — INSULIN ASPART 100 UNIT/ML ~~LOC~~ SOLN
10.0000 [IU] | Freq: Once | SUBCUTANEOUS | Status: AC
  Administered 2021-03-22: 10 [IU] via SUBCUTANEOUS

## 2021-03-22 MED ORDER — FAMOTIDINE IN NACL 20-0.9 MG/50ML-% IV SOLN
20.0000 mg | Freq: Once | INTRAVENOUS | Status: AC
  Administered 2021-03-22: 20 mg via INTRAVENOUS

## 2021-03-22 MED ORDER — SODIUM CHLORIDE 0.9 % IV SOLN
150.0000 mg | Freq: Once | INTRAVENOUS | Status: AC
  Administered 2021-03-22: 150 mg via INTRAVENOUS
  Filled 2021-03-22: qty 150

## 2021-03-22 MED ORDER — FAMOTIDINE IN NACL 20-0.9 MG/50ML-% IV SOLN
INTRAVENOUS | Status: AC
  Filled 2021-03-22: qty 50

## 2021-03-22 MED ORDER — SODIUM CHLORIDE 0.9% FLUSH
10.0000 mL | INTRAVENOUS | Status: DC | PRN
  Administered 2021-03-22: 10 mL
  Filled 2021-03-22: qty 10

## 2021-03-22 MED ORDER — SODIUM CHLORIDE 0.9 % IV SOLN
Freq: Once | INTRAVENOUS | Status: AC
Start: 2021-03-22 — End: 2021-03-22
  Filled 2021-03-22: qty 250

## 2021-03-22 MED ORDER — DEXAMETHASONE SODIUM PHOSPHATE 10 MG/ML IJ SOLN
5.0000 mg | Freq: Once | INTRAMUSCULAR | Status: AC
Start: 2021-03-22 — End: 2021-03-22
  Administered 2021-03-22: 5 mg via INTRAVENOUS

## 2021-03-22 MED ORDER — DIPHENHYDRAMINE HCL 50 MG/ML IJ SOLN
25.0000 mg | Freq: Once | INTRAMUSCULAR | Status: AC
  Administered 2021-03-22: 25 mg via INTRAVENOUS

## 2021-03-22 MED ORDER — PALONOSETRON HCL INJECTION 0.25 MG/5ML
INTRAVENOUS | Status: AC
  Filled 2021-03-22: qty 5

## 2021-03-22 NOTE — Assessment & Plan Note (Signed)
She has no clinical signs or symptoms of congestive heart failure She will continue aggressive lifestyle modification

## 2021-03-22 NOTE — Assessment & Plan Note (Signed)
Her blood pressure is better controlled She will continue to check her blood pressure carefully She will continue her current prescribed blood pressure medications

## 2021-03-22 NOTE — Assessment & Plan Note (Signed)
She has slight uncontrolled diabetes today likely exacerbated by her premedication She is advised dietary modification while on treatment I will add insulin to her regimen today

## 2021-03-22 NOTE — Assessment & Plan Note (Signed)
She has slight acute on chronic renal failure secondary to probably dehydration I will adjust the dose of her chemotherapy today based on her current height, weight and serum creatinine She will continue aggressive lifestyle changes and risk factor modification

## 2021-03-22 NOTE — Patient Instructions (Signed)
[facility] Discharge Instructions for Patients Receiving Chemotherapy  Today you received the following chemotherapy agents Taxol, Carboplatin  To help prevent nausea and vomiting after your treatment, we encourage you to take your nausea medication as directed  If you develop nausea and vomiting that is not controlled by your nausea medication, call the clinic.   BELOW ARE SYMPTOMS THAT SHOULD BE REPORTED IMMEDIATELY:  *FEVER GREATER THAN 100.5 F  *CHILLS WITH OR WITHOUT FEVER  NAUSEA AND VOMITING THAT IS NOT CONTROLLED WITH YOUR NAUSEA MEDICATION  *UNUSUAL SHORTNESS OF BREATH  *UNUSUAL BRUISING OR BLEEDING  TENDERNESS IN MOUTH AND THROAT WITH OR WITHOUT PRESENCE OF ULCERS  *URINARY PROBLEMS  *BOWEL PROBLEMS  UNUSUAL RASH Items with * indicate a potential emergency and should be followed up as soon as possible.  Feel free to call the clinic should you have any questions or concerns. The clinic phone number is [phone].  Please show the CHEMO ALERT CARD at check-in to the Emergency Department and triage nurse.   

## 2021-03-22 NOTE — Telephone Encounter (Signed)
Scheduled appts per 4/7 sch msg. Pt aware.  

## 2021-03-22 NOTE — Progress Notes (Signed)
[place] OFFICE PROGRESS NOTE  Patient Care Team: [name], MD as PCP - General (Family Medicine) Croitoru, Dani Gobble, MD as PCP - Cardiology (Cardiology)  ASSESSMENT & PLAN:  Papillary serous adenocarcinoma of endometrium [facility]) She tolerated treatment well without major side effects apart from occasional nausea and changes in bowel habits Her blood sugar is very high and I plan to reduce IV dexamethasone today She will also get additional insulin support We will proceed with treatment as scheduled  Diabetes mellitus type 2 in obese [facility]) She has slight uncontrolled diabetes today likely exacerbated by her premedication She is advised dietary modification while on treatment I will add insulin to her regimen today  Essential hypertension Her blood pressure is better controlled She will continue to check her blood pressure carefully She will continue her current prescribed blood pressure medications  Chronic diastolic CHF (congestive heart failure) ([place]) She has no clinical signs or symptoms of congestive heart failure She will continue aggressive lifestyle modification  CKD (chronic kidney disease), stage III ([place]) She has slight acute on chronic renal failure secondary to probably dehydration I will adjust the dose of her chemotherapy today based on her current height, weight and serum creatinine She will continue aggressive lifestyle changes and risk factor modification   No orders of the defined types were placed in this encounter.   All questions were answered. The patient knows to call the clinic with any problems, questions or concerns. The total time spent in the appointment was 40 minutes encounter with patients including review of chart and various tests results, discussions about plan of care and coordination of care plan   Heath Lark, MD 03/22/2021 10:57 AM  INTERVAL HISTORY: Please see below for problem oriented charting. She returns for  treatment and follow-up today She denies recent shortness of breath or leg swelling I documented blood pressure from home appears slightly better controlled in the last few days She is able to modify her diet Denies peripheral neuropathy No recent nausea or changes in bowel habits  SUMMARY OF ONCOLOGIC HISTORY: Oncology History Overview Note  Pap 12/14/20: adenocarcinoma, HPV negative  HER-2 positive by FISH Her-2 equivocal by [facility]  MSI stable    Papillary serous adenocarcinoma of endometrium ([place])   Initial Diagnosis   Endometrial carcinoma ([place])   01/12/2021 Initial Biopsy   A. ENDOCERVIX, CURETTAGE:  - Adenocarcinoma.  B. ENDOMETRIUM, BIOPSY:  - Adenocarcinoma.  COMMENT:  The differential diagnosis includes high grade serous carcinoma.  Both  specimens have a similar morphology; high grade serous carcinoma more  commonly arises in the endometrium.  Results reported to Dr. Hale Bogus  on 01/15/2021.  Dr. Saralyn Pilar reviewed the case.    01/16/2021 Tumor Marker   Patient's tumor was tested for the following markers: CA-125 Results of the tumor marker test revealed normal value, 10.7   01/25/2021 Imaging   CT C/A/P: 1. Small volume fluid in the endometrial canal with 7 mm hypoattenuating lesion in the myometrium of the anterior fundus. 2. No definite evidence for metastatic disease in the chest, abdomen, or pelvis. Small lymph nodes are noted along both pelvic sidewalls. Attention on follow-up recommended. 3. 5 mm ground-glass opacity peripheral left upper lobe. This is likely related to infection/inflammation and potentially scar. Attention on surveillance imaging recommended. 4. 5.8 cm lesion posterior lower uterine segment likely a fibroid. Ultrasound exam from 01/28/2010 demonstrated a 5.2 cm lesion in the left aspect of the posterior lower uterine body. 5. Aortic Atherosclerosis (ICD10-I70.0).  01/29/2021 Surgery   TRH/BSO, SLN biopsy left, selective pelvic LND  right, omentectomy  Findings: On EUA, 10-12cm enlarged mobile uterus. On intra-abdomina entry, minimal adhesions between the liver and anterior abdomen on the right. Some changes c/w fatty liver on the left. Omentum, stomach, small and large bowel all grossly normal. Bilateral ovaries normal appearing. Uterus with 5-6cm posterior fibroid, otherwise normal appearing. No mapping to right pelvis. On left, mapping to the level of the superior vessel artery, enlarged lymph node noted (likely sentinel) within the upper aspect of the obturator space. In bilateral pelvic basins, multiple enlarged lymph nodes. On the right, enlarged lymph nodes extended superiorly along the common iliac vessels. At the end of surgery, no obvious abdominal or pelvic evidence of disease.   01/29/2021 Pathology Results   A. SENTINEL LYMPH NODE, LEFT INTERNAL ILIAC, BIOPSY:  - Metastatic carcinoma in (1) of (1) lymph node.   B. SENTINEL LYMPH NODE, LEFT OBTURATOR, BIOPSY:  - Metastatic carcinoma in (1) of (1) lymph node.   C. LYMPH NODES, LEFT PELVIC, DISSECTION:  - Metastatic carcinoma in (1) of (3) lymph nodes.   D. LYMPH NODE, RIGHT EXTERNAL AND COMMON ILIAC, BIOPSY:  - Metastatic carcinoma in (1) of (1) lymph node.   E. UTERUS, CERVIX AND BILATERAL FALLOPIAN TUBES AND OVARIES, TOTAL  HYSTERECTOMY AND BILATERAL SALPINGO-OOPHORECTOMY:  - High grade serous carcinoma of endometrium, with invasion more than  half of the myometrium.  - Tumor invades the stromal connective tissue of the cervix.  - No involvement of uterine serosa or adnexa.  - Lymphovascular invasion is identified.  - See oncology table.   F. OMENTUM, OMENTECTOMY:  - Omentum, negative for carcinoma.   G. LYMPH NODES, RIGHT PELVIC, DISSECTION:  - Two lymph nodes, negative for carcinoma (0/2).   ONCOLOGY TABLE:   UTERUS, CARCINOMA OR CARCINOSARCOMA: Resection   Procedure: Total hysterectomy and bilateral salpingo-oophorectomy,  Omentectomy, Lymph  node sampling  Histologic Type: Serous carcinoma  Histologic Grade: High grade  Myometrial Invasion: > 50%  Uterine Serosa Involvement: Not identified  Cervical Stroma Involvement: Present  Other Tissue/Organ Involvement: Not identified  Peritoneal/Ascitic Fluid: Negative for carcinoma  Lymphovascular Invasion: Present  Regional Lymph Nodes:       Pelvic Lymph Nodes Examined:            2 Sentinel            6 Non-Sentinel            8 Total       Pelvic Lymph Nodes with Metastasis: 4            Macrometastasis: 3            Micrometastasis: 1            Isolated Tumor Cells: 0            Laterality of Lymph Nodes with Tumor: Right (non-sentinel),  Left (sentinel and non-sentinel)            Extracapsular Extension: Present       Para-Aortic Lymph Nodes Examined: Not applicable        Para-Aortic Lymph Nodes with Metastasis: Not applicable  Distant Metastasis:       Distant Site(s) Involved: Omentum: Not involved  Pathologic Stage Classification (pTNM, AJCC 8th Edition): pT2, pN1a  Ancillary Studies: HER2 will be ordered  Additional Findings: Leiomyomata.  Adenomyosis.  Representative Tumor Block: B1  Comment: Dr. Saralyn Pilar reviewed select slides.  (v4.2.0.1)    01/29/2021 Cancer Staging  Staging form: Corpus Uteri - Carcinoma and Carcinosarcoma, AJCC 8th Edition - Clinical stage from 01/29/2021: FIGO Stage IIIC1 (cT1b, cN1a, cM0) - Signed by Lafonda Mosses, MD on 02/02/2021 Histopathologic type: Mixed cell adenocarcinoma Stage prefix: Initial diagnosis Method of lymph node assessment: Other Histologic grade (G): G3 Histologic grading system: 3 grade system Lymph-vascular invasion (LVI): LVI present/identified, NOS Peritoneal cytology results: Negative Pelvic nodal status: Positive Number of pelvic nodes positive from dissection: 4 Number of pelvic nodes examined during dissection: 8 Para-aortic status: Not assessed Lymph node metastasis: Present Omentectomy  performed: Yes Morcellation performed: No   02/19/2021 Echocardiogram    1. Left ventricular ejection fraction, by estimation, is 55 to 60%. The left ventricle has normal function. The left ventricle demonstrates regional wall motion abnormalities (see scoring diagram/findings for description). There is mild left ventricular  hypertrophy. Left ventricular diastolic parameters are consistent with Grade I diastolic dysfunction (impaired relaxation). There is moderate hypokinesis of the left ventricular, basal septal wall and inferior wall. The average left ventricular global longitudinal strain is -17.9 %. The global longitudinal strain is abnormal.  2. Right ventricular systolic function is normal. The right ventricular size is normal. There is normal pulmonary artery systolic pressure. The estimated right ventricular systolic pressure is 29.4 mmHg.  3. The mitral valve is abnormal. Trivial mitral valve regurgitation.  4. The aortic valve is tricuspid. Aortic valve regurgitation is not visualized.  5. The inferior vena cava is normal in size with greater than 50% respiratory variability, suggesting right atrial pressure of 3 mmHg.   02/20/2021 Procedure   Successful placement of a right IJ approach Power Port with ultrasound and fluoroscopic guidance. The catheter is ready for use.   02/28/2021 -  Chemotherapy    Patient is on Treatment Plan: UTERINE CARBOPLATIN AUC 6 / PACLITAXEL Q21D        REVIEW OF SYSTEMS:   Constitutional: Denies fevers, chills or abnormal weight loss Eyes: Denies blurriness of vision Ears, nose, mouth, throat, and face: Denies mucositis or sore throat Respiratory: Denies cough, dyspnea or wheezes Cardiovascular: Denies palpitation, chest discomfort or lower extremity swelling Gastrointestinal:  Denies nausea, heartburn or change in bowel habits Skin: Denies abnormal skin rashes Lymphatics: Denies new lymphadenopathy or easy bruising Neurological:Denies numbness,  tingling or new weaknesses Behavioral/Psych: Mood is stable, no new changes  All other systems were reviewed with the patient and are negative.  I have reviewed the past medical history, past surgical history, social history and family history with the patient and they are unchanged from previous note.  ALLERGIES:  has No Known Allergies.  MEDICATIONS:  Current Outpatient Medications  Medication Sig Dispense Refill  . acetaminophen (TYLENOL) 500 MG tablet Take 1 tablet (500 mg total) by mouth every 6 (six) hours as needed. 30 tablet 0  . aspirin EC 81 MG EC tablet Take 1 tablet (81 mg total) by mouth daily.    Marland Kitchen atorvastatin (LIPITOR) 80 MG tablet TAKE 1 TABLET (80 MG TOTAL) BY MOUTH AT BEDTIME. 30 tablet 6  . bismuth subsalicylate (PEPTO BISMOL) 262 MG/15ML suspension Take 30 mLs by mouth every 6 (six) hours as needed for indigestion or diarrhea or loose stools.    Marland Kitchen dexamethasone (DECADRON) 4 MG tablet TAKE 2 TABLETS BY MOUTH THE NIGHT BEFORE CHEMOTHERAPY, EVERY 3 WEEKS, X 6 CYCLES 12 tablet 6  . doxazosin (CARDURA) 2 MG tablet TAKE 1 TABLET BY MOUTH DAILY. 30 tablet 1  . Dulaglutide (TRULICITY) 1.5 TM/5.4YT SOPN Inject 1.5 mg into the  skin once a week. (Patient taking differently: Inject 1.5 mg into the skin every Friday.) 4 pen 3  . furosemide (LASIX) 20 MG tablet TAKE 2 TABLETS (40 MG TOTAL) BY MOUTH DAILY. (Patient taking differently: Take 40 mg by mouth daily.) 60 tablet 2  . glimepiride (AMARYL) 4 MG tablet TAKE 2 TABLETS (8 MG TOTAL) BY MOUTH DAILY WITH BREAKFAST. 60 tablet 6  . Insulin Pen Needle (TRUEPLUS PEN NEEDLES) 32G X 4 MM MISC Use as directed to inject victoza once daily (Patient not taking: Reported on 02/26/2021) 100 each 3  . isosorbide mononitrate (IMDUR) 30 MG 24 hr tablet TAKE 2 TABLETS (60 MG TOTAL) BY MOUTH DAILY. 60 tablet 1  . lidocaine-prilocaine (EMLA) cream APPLY TO AFFECTED AREA ONCE AS DIRECTED 30 g 3  . lisinopril (ZESTRIL) 20 MG tablet TAKE 1 TABLET BY MOUTH  ONCE A DAY 30 tablet 9  . metFORMIN (GLUCOPHAGE) 500 MG tablet TAKE 2 TABLETS (1,000 MG TOTAL) BY MOUTH 2 (TWO) TIMES DAILY WITH A MEAL. 120 tablet 1  . metoprolol tartrate (LOPRESSOR) 100 MG tablet TAKE 1 TABLET (100 MG TOTAL) BY MOUTH 2 (TWO) TIMES DAILY. 60 tablet 6  . nitroGLYCERIN (NITROSTAT) 0.4 MG SL tablet Place 1 tablet (0.4 mg total) under the tongue every 5 (five) minutes as needed for chest pain. (Patient not taking: Reported on 02/26/2021) 25 tablet 2  . ondansetron (ZOFRAN) 8 MG tablet TAKE 1 TABLET BY MOUTH EVERY 8 HOURS AS NEEDED. (Patient not taking: Reported on 02/26/2021) 30 tablet 1  . pantoprazole (PROTONIX) 40 MG tablet TAKE 1 TABLET (40 MG TOTAL) BY MOUTH DAILY. (Patient not taking: Reported on 02/26/2021) 30 tablet 6  . prochlorperazine (COMPAZINE) 10 MG tablet TAKE 1 TABLET BY MOUTH EVERY 6 HOURS AS NEEDED (NAUSEA OR VOMITING). (Patient not taking: Reported on 02/26/2021) 30 tablet 1  . senna-docusate (SENOKOT-S) 8.6-50 MG tablet TAKE 2 TABLETS BY MOUTH AT BEDTIME. FOR AFTER SURGERY, DO NOT TAKE IF HAVING DIARRHEA 30 tablet 0  . traMADol (ULTRAM) 50 MG tablet TAKE 1 TABLET (50 MG TOTAL) BY MOUTH EVERY 6 (SIX) HOURS AS NEEDED FOR SEVERE PAIN. FOR AFTER SURGERY ONLY, DO NOT TAKE AND DRIVE 10 tablet 0   No current facility-administered medications for this visit.   Facility-Administered Medications Ordered in Other Visits  Medication Dose Route Frequency Provider Last Rate Last Admin  . 0.9 %  sodium chloride infusion   Intravenous Once Alvy Bimler, Chasady Longwell, MD      . CARBOplatin (PARAPLATIN) 460 mg in sodium chloride 0.9 % 250 mL chemo infusion  460 mg Intravenous Once Alvy Bimler, Brieanna Nau, MD      . dexamethasone (DECADRON) 5 mg in sodium chloride 0.9 % 50 mL IVPB  5 mg Intravenous Once Alvy Bimler, Jinan Biggins, MD      . diphenhydrAMINE (BENADRYL) injection 25 mg  25 mg Intravenous Once Alvy Bimler, Markevius Trombetta, MD      . famotidine (PEPCID) IVPB 20 mg premix  20 mg Intravenous Once Alvy Bimler, Lorely Bubb, MD      . fosaprepitant  (EMEND) 150 mg in sodium chloride 0.9 % 145 mL IVPB  150 mg Intravenous Once Treveon Bourcier, MD      . heparin lock flush 100 unit/mL  500 Units Intracatheter Once PRN Alvy Bimler, Vernisha Bacote, MD      . PACLitaxel (TAXOL) 288 mg in sodium chloride 0.9 % 250 mL chemo infusion (> 37m/m2)  140 mg/m2 (Treatment Plan Recorded) Intravenous Once GHeath Lark MD      . palonosetron (ALOXI) injection  0.25 mg  0.25 mg Intravenous Once Deloris Mittag, MD      . sodium chloride flush (NS) 0.9 % injection 10 mL  10 mL Intracatheter PRN Alvy Bimler, Tibor Lemmons, MD        PHYSICAL EXAMINATION: ECOG PERFORMANCE STATUS: 1 - Symptomatic but completely ambulatory  Vitals:   03/22/21 1021  BP: 131/72  Pulse: 79  Resp: 18  Temp: 98.1 F (36.7 C)  SpO2: 100%   Filed Weights   03/22/21 1021  Weight: 211 lb 6.4 oz (95.9 kg)    GENERAL:alert, no distress and comfortable SKIN: skin color, texture, turgor are normal, no rashes or significant lesions EYES: normal, Conjunctiva are pink and non-injected, sclera clear OROPHARYNX:no exudate, no erythema and lips, buccal mucosa, and tongue normal  NECK: supple, thyroid normal size, non-tender, without nodularity LYMPH:  no palpable lymphadenopathy in the cervical, axillary or inguinal LUNGS: clear to auscultation and percussion with normal breathing effort HEART: regular rate & rhythm and no murmurs and no lower extremity edema ABDOMEN:abdomen soft, non-tender and normal bowel sounds Musculoskeletal:no cyanosis of digits and no clubbing  NEURO: alert & oriented x 3 with fluent speech, no focal motor/sensory deficits  LABORATORY DATA:  I have reviewed the data as listed    Component Value Date/Time   NA 142 03/22/2021 1010   NA 143 10/18/2020 1028   K 4.0 03/22/2021 1010   CL 107 03/22/2021 1010   CO2 19 (L) 03/22/2021 1010   GLUCOSE 250 (H) 03/22/2021 1010   BUN 29 (H) 03/22/2021 1010   BUN 13 10/18/2020 1028   CREATININE 1.30 (H) 03/22/2021 1010   CREATININE 0.79 02/11/2017  0943   CALCIUM 8.7 (L) 03/22/2021 1010   PROT 7.0 03/22/2021 1010   PROT 6.7 10/18/2020 1028   ALBUMIN 3.5 03/22/2021 1010   ALBUMIN 4.1 10/18/2020 1028   AST 12 (L) 03/22/2021 1010   ALT 16 03/22/2021 1010   ALKPHOS 105 03/22/2021 1010   BILITOT 0.3 03/22/2021 1010   GFRNONAA 46 (L) 03/22/2021 1010   GFRNONAA 83 02/11/2017 0943   GFRAA 70 10/18/2020 1028   GFRAA >89 02/11/2017 0943    No results found for: SPEP, UPEP  Lab Results  Component Value Date   WBC 5.2 03/22/2021   NEUTROABS 3.9 03/22/2021   HGB 11.3 (L) 03/22/2021   HCT 33.9 (L) 03/22/2021   MCV 90.2 03/22/2021   PLT 211 03/22/2021      Chemistry      Component Value Date/Time   NA 142 03/22/2021 1010   NA 143 10/18/2020 1028   K 4.0 03/22/2021 1010   CL 107 03/22/2021 1010   CO2 19 (L) 03/22/2021 1010   BUN 29 (H) 03/22/2021 1010   BUN 13 10/18/2020 1028   CREATININE 1.30 (H) 03/22/2021 1010   CREATININE 0.79 02/11/2017 0943      Component Value Date/Time   CALCIUM 8.7 (L) 03/22/2021 1010   ALKPHOS 105 03/22/2021 1010   AST 12 (L) 03/22/2021 1010   ALT 16 03/22/2021 1010   BILITOT 0.3 03/22/2021 1010       RADIOGRAPHIC STUDIES: I have personally reviewed the radiological images as listed and agreed with the findings in the report. IR IMAGING GUIDED PORT INSERTION  Result Date: 02/20/2021 INDICATION: 65 year old female with endometrial cancer in need of durable venous access prior to chemotherapy. EXAM: IMPLANTED PORT A CATH PLACEMENT WITH ULTRASOUND AND FLUOROSCOPIC GUIDANCE MEDICATIONS: None. ANESTHESIA/SEDATION: Versed 3 mg IV; Fentanyl 100 mcg IV; Moderate Sedation Time:  17  minutes The patient was continuously monitored during the procedure by the interventional radiology nurse under my direct supervision. FLUOROSCOPY TIME:  0 minutes, 12 seconds (3 mGy) COMPLICATIONS: None immediate. PROCEDURE: The right neck and chest was prepped with chlorhexidine, and draped in the usual sterile fashion using  maximum barrier technique (cap and mask, sterile gown, sterile gloves, large sterile sheet, hand hygiene and cutaneous antiseptic). Local anesthesia was attained by infiltration with 1% lidocaine with epinephrine. Ultrasound demonstrated patency of the right internal jugular vein, and this was documented with an image. Under real-time ultrasound guidance, this vein was accessed with a 21 gauge micropuncture needle and image documentation was performed. A small dermatotomy was made at the access site with an 11 scalpel. A 0.018" wire was advanced into the SVC and the access needle exchanged for a 20F micropuncture vascular sheath. The 0.018" wire was then removed and a 0.035" wire advanced into the IVC. An appropriate location for the subcutaneous reservoir was selected below the clavicle and an incision was made through the skin and underlying soft tissues. The subcutaneous tissues were then dissected using a combination of blunt and sharp surgical technique and a pocket was formed. A single lumen power injectable portacatheter was then tunneled through the subcutaneous tissues from the pocket to the dermatotomy and the port reservoir placed within the subcutaneous pocket. The venous access site was then serially dilated and a peel away vascular sheath placed over the wire. The wire was removed and the port catheter advanced into position under fluoroscopic guidance. The catheter tip is positioned in the superior cavoatrial junction. This was documented with a spot image. The portacatheter was then tested and found to flush and aspirate well. The port was flushed with saline followed by 100 units/mL heparinized saline. The pocket was then closed in two layers using first subdermal inverted interrupted absorbable sutures followed by a running subcuticular suture. The epidermis was then sealed with Dermabond. The dermatotomy at the venous access site was also closed with Dermabond. IMPRESSION: Successful placement of a  right IJ approach Power Port with ultrasound and fluoroscopic guidance. The catheter is ready for use. Electronically Signed   By: Jacqulynn Cadet M.D.   On: 02/20/2021 16:22

## 2021-03-22 NOTE — Assessment & Plan Note (Signed)
She tolerated treatment well without major side effects apart from occasional nausea and changes in bowel habits Her blood sugar is very high and I plan to reduce IV dexamethasone today She will also get additional insulin support We will proceed with treatment as scheduled

## 2021-04-02 ENCOUNTER — Other Ambulatory Visit (HOSPITAL_COMMUNITY): Payer: Self-pay

## 2021-04-02 MED FILL — Doxazosin Mesylate Tab 2 MG: ORAL | 30 days supply | Qty: 30 | Fill #0 | Status: AC

## 2021-04-03 ENCOUNTER — Other Ambulatory Visit: Payer: Self-pay | Admitting: Family Medicine

## 2021-04-03 ENCOUNTER — Other Ambulatory Visit: Payer: Self-pay

## 2021-04-03 DIAGNOSIS — I5032 Chronic diastolic (congestive) heart failure: Secondary | ICD-10-CM

## 2021-04-03 DIAGNOSIS — E1159 Type 2 diabetes mellitus with other circulatory complications: Secondary | ICD-10-CM

## 2021-04-03 MED ORDER — METFORMIN HCL 500 MG PO TABS
1000.0000 mg | ORAL_TABLET | Freq: Two times a day (BID) | ORAL | 0 refills | Status: DC
  Filled 2021-04-03: qty 120, 30d supply, fill #0

## 2021-04-03 MED ORDER — CLOPIDOGREL BISULFATE 75 MG PO TABS
ORAL_TABLET | ORAL | 6 refills | Status: DC
  Filled 2021-04-03: qty 30, 30d supply, fill #0
  Filled 2021-05-15: qty 30, 30d supply, fill #1

## 2021-04-03 MED ORDER — FUROSEMIDE 20 MG PO TABS
40.0000 mg | ORAL_TABLET | Freq: Every day | ORAL | 0 refills | Status: DC
  Filled 2021-04-03: qty 60, 30d supply, fill #0

## 2021-04-03 MED ORDER — ISOSORBIDE MONONITRATE ER 30 MG PO TB24
60.0000 mg | ORAL_TABLET | Freq: Every day | ORAL | 0 refills | Status: DC
  Filled 2021-04-03: qty 60, 30d supply, fill #0

## 2021-04-03 MED FILL — Atorvastatin Calcium Tab 80 MG (Base Equivalent): ORAL | 30 days supply | Qty: 30 | Fill #0 | Status: AC

## 2021-04-03 MED FILL — Pantoprazole Sodium EC Tab 40 MG (Base Equiv): ORAL | 30 days supply | Qty: 30 | Fill #0 | Status: AC

## 2021-04-03 MED FILL — Glimepiride Tab 4 MG: ORAL | 30 days supply | Qty: 60 | Fill #0 | Status: AC

## 2021-04-03 NOTE — Telephone Encounter (Signed)
Requested medication (s) are due for refill today:yes  Requested medication (s) are on the active medication list: yes  Last refill:  02/22/2021  Future visit scheduled:no  Notes to clinic: Patient due for follow up appointment on 03/14/2021   Requested Prescriptions  Pending Prescriptions Disp Refills   metFORMIN (GLUCOPHAGE) 500 MG tablet 120 tablet 1    Sig: TAKE 2 TABLETS (1,000 MG TOTAL) BY MOUTH 2 (TWO) TIMES DAILY WITH A MEAL.      Endocrinology:  Diabetes - Biguanides Failed - 04/03/2021  9:39 AM      Failed - Cr in normal range and within 360 days    Creatinine  Date Value Ref Range Status  03/22/2021 1.30 (H) 0.44 - 1.00 mg/dL Final   Creat  Date Value Ref Range Status  02/11/2017 0.79 0.50 - 1.05 mg/dL Final    Comment:      For patients > or = 63 years of age: The upper reference limit for Creatinine is approximately 13% higher for people identified as African-American.      Creatinine, Urine  Date Value Ref Range Status  06/19/2019 268.44 mg/dL Final    Comment:    Performed at [facility] [address], [place],  [address]          Passed - HBA1C is between 0 and 7.9 and within 180 days    HbA1c, POC (controlled diabetic range)  Date Value Ref Range Status  10/18/2020 7.1 (A) 0.0 - 7.0 % Final   Hgb A1c MFr Bld  Date Value Ref Range Status  01/16/2021 7.1 (H) 4.8 - 5.6 % Final    Comment:    (NOTE) Pre diabetes:          5.7%-6.4%  Diabetes:              >6.4%  Glycemic control for   <7.0% adults with diabetes           Passed - AA eGFR in normal range and within 360 days    GFR, Est African American  Date Value Ref Range Status  02/11/2017 >89 >=60 mL/min Final   GFR calc Af Amer  Date Value Ref Range Status  10/18/2020 70 >59 mL/min/1.73 Final    Comment:    **In accordance with recommendations from the NKF-ASN Task force,**   [facility] is in the process of updating its eGFR calculation to the   2021 CKD-EPI  creatinine equation that estimates kidney function   without a race variable.    GFR, Est Non African American  Date Value Ref Range Status  02/11/2017 83 >=60 mL/min Final   GFR, Estimated  Date Value Ref Range Status  03/22/2021 46 (L) >60 mL/min Final    Comment:    (NOTE) Calculated using the CKD-EPI Creatinine Equation (2021)           Passed - Valid encounter within last 6 months    Recent Outpatient Visits           3 months ago Post-menopausal bleeding   [place], [place], MD   5 months ago Type 2 diabetes mellitus with other circulatory complication, without long-term current use of insulin ([place])   [place], [place], MD   11 months ago Type 2 diabetes mellitus with other circulatory complication, without long-term current use of insulin ([place])   [facility] [name], MD   1 year  ago Type 2 diabetes mellitus with other circulatory complication, without long-term current use of insulin ([place])   [place], [place], MD   2 years ago Dyslipidemia   [place], [name], MD                  isosorbide mononitrate (IMDUR) 30 MG 24 hr tablet 60 tablet 1    Sig: TAKE 2 TABLETS (60 MG TOTAL) BY MOUTH DAILY.      Cardiovascular:  Nitrates Passed - 04/03/2021  9:39 AM      Passed - Last BP in normal range    BP Readings from Last 1 Encounters:  03/22/21 131/72          Passed - Last Heart Rate in normal range    Pulse Readings from Last 1 Encounters:  03/22/21 79          Passed - Valid encounter within last 12 months    Recent Outpatient Visits           3 months ago Post-menopausal bleeding   [place], [place], MD   5 months ago Type 2 diabetes mellitus with other circulatory complication, without long-term current use of  insulin ([place])   [place], [place], MD   11 months ago Type 2 diabetes mellitus with other circulatory complication, without long-term current use of insulin ([place])   [place], [place], MD   1 year ago Type 2 diabetes mellitus with other circulatory complication, without long-term current use of insulin ([place])   [place], [name], MD   2 years ago Dyslipidemia   [place], [place], MD                  furosemide (LASIX) 20 MG tablet 60 tablet 2    Sig: TAKE 2 TABLETS (40 MG TOTAL) BY MOUTH DAILY.      Cardiovascular:  Diuretics - Loop Failed - 04/03/2021  9:39 AM      Failed - Ca in normal range and within 360 days    Calcium  Date Value Ref Range Status  03/22/2021 8.7 (L) 8.9 - 10.3 mg/dL Final   Calcium, Ion  Date Value Ref Range Status  09/04/2008 1.15  Final          Failed - Cr in normal range and within 360 days    Creatinine  Date Value Ref Range Status  03/22/2021 1.30 (H) 0.44 - 1.00 mg/dL Final   Creat  Date Value Ref Range Status  02/11/2017 0.79 0.50 - 1.05 mg/dL Final    Comment:      For patients > or = 63 years of age: The upper reference limit for Creatinine is approximately 13% higher for people identified as African-American.      Creatinine, Urine  Date Value Ref Range Status  06/19/2019 268.44 mg/dL Final    Comment:    Performed at [facility] [address], [place], [place] [address]          Passed - K in normal range and within 360 days    Potassium  Date Value Ref Range Status  03/22/2021 4.0 3.5 - 5.1 mmol/L Final          Passed - Na in normal range and within 360 days  Sodium  Date Value Ref Range Status  03/22/2021 142 135 - 145 mmol/L Final  10/18/2020 143 134 - 144 mmol/L Final          Passed - Last BP in normal range    BP Readings from Last 1  Encounters:  03/22/21 131/72          Passed - Valid encounter within last 6 months    Recent Outpatient Visits           3 months ago Post-menopausal bleeding   [place], [place], MD   5 months ago Type 2 diabetes mellitus with other circulatory complication, without long-term current use of insulin ([place])   [place], [place], MD   11 months ago Type 2 diabetes mellitus with other circulatory complication, without long-term current use of insulin ([place])   [place], [place], MD   1 year ago Type 2 diabetes mellitus with other circulatory complication, without long-term current use of insulin ([place])   [place], [place], MD   2 years ago Dyslipidemia   [facility] [name], MD

## 2021-04-05 ENCOUNTER — Other Ambulatory Visit: Payer: Self-pay

## 2021-04-09 ENCOUNTER — Other Ambulatory Visit: Payer: Self-pay

## 2021-04-13 ENCOUNTER — Inpatient Hospital Stay: Payer: Self-pay

## 2021-04-13 ENCOUNTER — Other Ambulatory Visit (HOSPITAL_COMMUNITY): Payer: Self-pay

## 2021-04-13 ENCOUNTER — Inpatient Hospital Stay (HOSPITAL_BASED_OUTPATIENT_CLINIC_OR_DEPARTMENT_OTHER): Payer: Self-pay | Admitting: Hematology and Oncology

## 2021-04-13 ENCOUNTER — Other Ambulatory Visit: Payer: Self-pay | Admitting: Hematology and Oncology

## 2021-04-13 ENCOUNTER — Other Ambulatory Visit: Payer: Self-pay

## 2021-04-13 ENCOUNTER — Encounter: Payer: Self-pay | Admitting: Hematology and Oncology

## 2021-04-13 VITALS — BP 159/74 | HR 66 | Temp 97.3°F | Resp 16 | Ht 62.0 in | Wt 214.3 lb

## 2021-04-13 DIAGNOSIS — C541 Malignant neoplasm of endometrium: Secondary | ICD-10-CM

## 2021-04-13 DIAGNOSIS — T451X5A Adverse effect of antineoplastic and immunosuppressive drugs, initial encounter: Secondary | ICD-10-CM

## 2021-04-13 DIAGNOSIS — M25561 Pain in right knee: Secondary | ICD-10-CM

## 2021-04-13 DIAGNOSIS — I5032 Chronic diastolic (congestive) heart failure: Secondary | ICD-10-CM

## 2021-04-13 DIAGNOSIS — D6481 Anemia due to antineoplastic chemotherapy: Secondary | ICD-10-CM

## 2021-04-13 DIAGNOSIS — M25562 Pain in left knee: Secondary | ICD-10-CM

## 2021-04-13 LAB — CMP (CANCER CENTER ONLY)
ALT: 14 U/L (ref 0–44)
AST: 15 U/L (ref 15–41)
Albumin: 3.3 g/dL — ABNORMAL LOW (ref 3.5–5.0)
Alkaline Phosphatase: 102 U/L (ref 38–126)
Anion gap: 12 (ref 5–15)
BUN: 17 mg/dL (ref 8–23)
CO2: 25 mmol/L (ref 22–32)
Calcium: 9 mg/dL (ref 8.9–10.3)
Chloride: 106 mmol/L (ref 98–111)
Creatinine: 0.87 mg/dL (ref 0.44–1.00)
GFR, Estimated: >60 mL/min (ref >60)
Glucose, Bld: 92 mg/dL (ref 70–99)
Potassium: 3.2 mmol/L — ABNORMAL LOW (ref 3.5–5.1)
Sodium: 143 mmol/L (ref 135–145)
Total Bilirubin: 0.3 mg/dL (ref 0.3–1.2)
Total Protein: 6.7 g/dL (ref 6.5–8.1)

## 2021-04-13 LAB — CBC WITH DIFFERENTIAL (CANCER CENTER ONLY)
Abs Immature Granulocytes: 0.02 10*3/uL (ref 0.00–0.07)
Basophils Absolute: 0 10*3/uL (ref 0.0–0.1)
Basophils Relative: 1 %
Eosinophils Absolute: 0 10*3/uL (ref 0.0–0.5)
Eosinophils Relative: 1 %
HCT: 32.4 % — ABNORMAL LOW (ref 36.0–46.0)
Hemoglobin: 10.7 g/dL — ABNORMAL LOW (ref 12.0–15.0)
Immature Granulocytes: 0 %
Lymphocytes Relative: 50 %
Lymphs Abs: 3 10*3/uL (ref 0.7–4.0)
MCH: 30.1 pg (ref 26.0–34.0)
MCHC: 33 g/dL (ref 30.0–36.0)
MCV: 91 fL (ref 80.0–100.0)
Monocytes Absolute: 0.4 10*3/uL (ref 0.1–1.0)
Monocytes Relative: 7 %
Neutro Abs: 2.4 10*3/uL (ref 1.7–7.7)
Neutrophils Relative %: 41 %
Platelet Count: 200 10*3/uL (ref 150–400)
RBC: 3.56 MIL/uL — ABNORMAL LOW (ref 3.87–5.11)
RDW: 14.8 % (ref 11.5–15.5)
WBC Count: 5.9 10*3/uL (ref 4.0–10.5)
nRBC: 0 % (ref 0.0–0.2)

## 2021-04-13 MED ORDER — DIPHENHYDRAMINE HCL 50 MG/ML IJ SOLN
25.0000 mg | Freq: Once | INTRAMUSCULAR | Status: AC
  Administered 2021-04-13: 25 mg via INTRAVENOUS

## 2021-04-13 MED ORDER — FAMOTIDINE IN NACL 20-0.9 MG/50ML-% IV SOLN
20.0000 mg | Freq: Once | INTRAVENOUS | Status: AC
  Administered 2021-04-13: 20 mg via INTRAVENOUS

## 2021-04-13 MED ORDER — DEXAMETHASONE SODIUM PHOSPHATE 10 MG/ML IJ SOLN
5.0000 mg | Freq: Once | INTRAMUSCULAR | Status: AC
  Administered 2021-04-13: 5 mg via INTRAVENOUS

## 2021-04-13 MED ORDER — FAMOTIDINE IN NACL 20-0.9 MG/50ML-% IV SOLN
INTRAVENOUS | Status: AC
  Filled 2021-04-13: qty 50

## 2021-04-13 MED ORDER — PALONOSETRON HCL INJECTION 0.25 MG/5ML
0.2500 mg | Freq: Once | INTRAVENOUS | Status: AC
Start: 2021-04-13 — End: 2021-04-13
  Administered 2021-04-13: 0.25 mg via INTRAVENOUS

## 2021-04-13 MED ORDER — PALONOSETRON HCL INJECTION 0.25 MG/5ML
INTRAVENOUS | Status: AC
  Filled 2021-04-13: qty 5

## 2021-04-13 MED ORDER — DEXAMETHASONE SODIUM PHOSPHATE 10 MG/ML IJ SOLN
INTRAMUSCULAR | Status: AC
  Filled 2021-04-13: qty 1

## 2021-04-13 MED ORDER — SODIUM CHLORIDE 0.9 % IV SOLN
5.0000 mg | Freq: Once | INTRAVENOUS | Status: DC
  Filled 2021-04-13: qty 0.5

## 2021-04-13 MED ORDER — SODIUM CHLORIDE 0.9 % IV SOLN
Freq: Once | INTRAVENOUS | Status: AC
  Filled 2021-04-13: qty 250

## 2021-04-13 MED ORDER — TRAMADOL HCL 50 MG PO TABS
50.0000 mg | ORAL_TABLET | Freq: Four times a day (QID) | ORAL | 0 refills | Status: DC | PRN
  Filled 2021-04-13: qty 30, 8d supply, fill #0

## 2021-04-13 MED ORDER — SODIUM CHLORIDE 0.9% FLUSH
10.0000 mL | INTRAVENOUS | Status: DC | PRN
  Administered 2021-04-13: 10 mL
  Filled 2021-04-13: qty 10

## 2021-04-13 MED ORDER — SODIUM CHLORIDE 0.9 % IV SOLN
630.0000 mg | Freq: Once | INTRAVENOUS | Status: AC
  Administered 2021-04-13: 630 mg via INTRAVENOUS
  Filled 2021-04-13: qty 63

## 2021-04-13 MED ORDER — DIPHENHYDRAMINE HCL 50 MG/ML IJ SOLN
INTRAMUSCULAR | Status: AC
  Filled 2021-04-13: qty 1

## 2021-04-13 MED ORDER — SODIUM CHLORIDE 0.9 % IV SOLN
140.0000 mg/m2 | Freq: Once | INTRAVENOUS | Status: AC
  Administered 2021-04-13: 288 mg via INTRAVENOUS
  Filled 2021-04-13: qty 48

## 2021-04-13 MED ORDER — HEPARIN SOD (PORK) LOCK FLUSH 100 UNIT/ML IV SOLN
500.0000 [IU] | Freq: Once | INTRAVENOUS | Status: AC | PRN
  Administered 2021-04-13: 500 [IU]
  Filled 2021-04-13: qty 5

## 2021-04-13 MED ORDER — SODIUM CHLORIDE 0.9 % IV SOLN
150.0000 mg | Freq: Once | INTRAVENOUS | Status: AC
  Administered 2021-04-13: 150 mg via INTRAVENOUS
  Filled 2021-04-13: qty 150

## 2021-04-13 NOTE — Patient Instructions (Addendum)
[facility]  Discharge Instructions: Thank you for choosing [facility] to provide your oncology and hematology care.   If you have a lab appointment with the Cancer Center, please go directly to the Cancer Center and check in at the registration area.   Wear comfortable clothing and clothing appropriate for easy access to any Portacath or PICC line.   We strive to give you quality time with your provider. You may need to reschedule your appointment if you arrive late (15 or more minutes).  Arriving late affects you and other patients whose appointments are after yours.  Also, if you miss three or more appointments without notifying the office, you may be dismissed from the clinic at the provider's discretion.      For prescription refill requests, have your pharmacy contact our office and allow 72 hours for refills to be completed.    Today you received the following chemotherapy and/or immunotherapy agents Taxol and carboplatin      To help prevent nausea and vomiting after your treatment, we encourage you to take your nausea medication as directed.  BELOW ARE SYMPTOMS THAT SHOULD BE REPORTED IMMEDIATELY: *FEVER GREATER THAN 100.4 F (38 C) OR HIGHER *CHILLS OR SWEATING *NAUSEA AND VOMITING THAT IS NOT CONTROLLED WITH YOUR NAUSEA MEDICATION *UNUSUAL SHORTNESS OF BREATH *UNUSUAL BRUISING OR BLEEDING *URINARY PROBLEMS (pain or burning when urinating, or frequent urination) *BOWEL PROBLEMS (unusual diarrhea, constipation, pain near the anus) TENDERNESS IN MOUTH AND THROAT WITH OR WITHOUT PRESENCE OF ULCERS (sore throat, sores in mouth, or a toothache) UNUSUAL RASH, SWELLING OR PAIN  UNUSUAL VAGINAL DISCHARGE OR ITCHING   Items with * indicate a potential emergency and should be followed up as soon as possible or go to the Emergency Department if any problems should occur.  Please show the CHEMOTHERAPY ALERT CARD or IMMUNOTHERAPY ALERT CARD at  check-in to the Emergency Department and triage nurse.  Should you have questions after your visit or need to cancel or reschedule your appointment, please contact [facility]  Dept: [phone]  and follow the prompts.  Office hours are 8:00 a.m. to 4:30 p.m. Monday - Friday. Please note that voicemails left after 4:00 p.m. may not be returned until the following business day.  We are closed weekends and major holidays. You have access to a nurse at all times for urgent questions. Please call the main number to the clinic Dept: [phone] and follow the prompts.   For any non-urgent questions, you may also contact your provider using MyChart. We now offer e-Visits for anyone 18 and older to request care online for non-urgent symptoms. For details visit [email or web address].   Also download the MyChart app! Go to the app store, search "MyChart", open the app, select , and log in with your MyChart username and password.  Due to Covid, a mask is required upon entering the hospital/clinic. If you do not have a mask, one will be given to you upon arrival. For doctor visits, patients may have 1 support person aged 18 or older with them. For treatment visits, patients cannot have anyone with them due to current Covid guidelines and our immunocompromised population.   

## 2021-04-13 NOTE — Assessment & Plan Note (Signed)

## 2021-04-13 NOTE — Assessment & Plan Note (Signed)
She has no clinical signs or symptoms of congestive heart failure.  She will continue medical management 

## 2021-04-13 NOTE — Progress Notes (Signed)
[name] OFFICE PROGRESS NOTE  Patient Care Team: [name], MD as PCP - General (Family Medicine) Croitoru, Dani Gobble, MD as PCP - Cardiology (Cardiology)  ASSESSMENT & PLAN:  Papillary serous adenocarcinoma of endometrium [facility]) She tolerated recent chemotherapy well without major side effects She is concerned whether she is responding to treatment or not I recommend CT imaging for further assessment after 2 days treatment  Chronic diastolic CHF (congestive heart failure) ([place]) She has no clinical signs or symptoms of congestive heart failure She will continue medical management  Anemia due to antineoplastic chemotherapy This is likely due to recent treatment. The patient denies recent history of bleeding such as epistaxis, hematuria or hematochezia. She is asymptomatic from the anemia. I will observe for now.  She does not require transfusion now. I will continue the chemotherapy at current dose without dosage adjustment.  If the anemia gets progressive worse in the future, I might have to delay her treatment or adjust the chemotherapy dose.    Orders Placed This Encounter  Procedures  . CT ABDOMEN PELVIS W CONTRAST    Standing Status:   Future    Standing Expiration Date:   04/13/2022    Order Specific Question:   If indicated for the ordered procedure, I authorize the administration of contrast media per Radiology protocol    Answer:   Yes    Order Specific Question:   Preferred imaging location?    Answer:   [facility]    Order Specific Question:   Radiology Contrast Protocol - do NOT remove file path    Answer:   [email or web address]    All questions were answered. The patient knows to call the clinic with any problems, questions or concerns. The total time spent in the appointment was 20 minutes encounter with patients including review of chart and various tests results, discussions about plan of care and  coordination of care plan   Heath Lark, MD 04/13/2021 1:44 PM  INTERVAL HISTORY: Please see below for problem oriented charting. She returns for further follow-up She is doing well Her blood sugar and blood pressure at home were satisfactory She denies recent nausea or constipation No worsening peripheral neuropathy with treatment  SUMMARY OF ONCOLOGIC HISTORY: Oncology History Overview Note  Pap 12/14/20: adenocarcinoma, HPV negative  HER-2 positive by FISH Her-2 equivocal by [facility]  [facility] stable    Papillary serous adenocarcinoma of endometrium ([place])   Initial Diagnosis   Endometrial carcinoma ([place])   01/12/2021 Initial Biopsy   A. ENDOCERVIX, CURETTAGE:  - Adenocarcinoma.  B. ENDOMETRIUM, BIOPSY:  - Adenocarcinoma.  COMMENT:  The differential diagnosis includes high grade serous carcinoma.  Both  specimens have a similar morphology; high grade serous carcinoma more  commonly arises in the endometrium.  Results reported to Dr. Hale Bogus  on 01/15/2021.  Dr. Saralyn Pilar reviewed the case.    01/16/2021 Tumor Marker   Patient's tumor was tested for the following markers: CA-125 Results of the tumor marker test revealed normal value, 10.7   01/25/2021 Imaging   CT C/A/P: 1. Small volume fluid in the endometrial canal with 7 mm hypoattenuating lesion in the myometrium of the anterior fundus. 2. No definite evidence for metastatic disease in the chest, abdomen, or pelvis. Small lymph nodes are noted along both pelvic sidewalls. Attention on follow-up recommended. 3. 5 mm ground-glass opacity peripheral left upper lobe. This is likely related to infection/inflammation and potentially scar. Attention on surveillance imaging recommended. 4. 5.8 cm lesion posterior  lower uterine segment likely a fibroid. Ultrasound exam from 01/28/2010 demonstrated a 5.2 cm lesion in the left aspect of the posterior lower uterine body. 5. Aortic Atherosclerosis (ICD10-I70.0).   01/29/2021 Surgery    TRH/BSO, SLN biopsy left, selective pelvic LND right, omentectomy  Findings: On EUA, 10-12cm enlarged mobile uterus. On intra-abdomina entry, minimal adhesions between the liver and anterior abdomen on the right. Some changes c/w fatty liver on the left. Omentum, stomach, small and large bowel all grossly normal. Bilateral ovaries normal appearing. Uterus with 5-6cm posterior fibroid, otherwise normal appearing. No mapping to right pelvis. On left, mapping to the level of the superior vessel artery, enlarged lymph node noted (likely sentinel) within the upper aspect of the obturator space. In bilateral pelvic basins, multiple enlarged lymph nodes. On the right, enlarged lymph nodes extended superiorly along the common iliac vessels. At the end of surgery, no obvious abdominal or pelvic evidence of disease.   01/29/2021 Pathology Results   A. SENTINEL LYMPH NODE, LEFT INTERNAL ILIAC, BIOPSY:  - Metastatic carcinoma in (1) of (1) lymph node.   B. SENTINEL LYMPH NODE, LEFT OBTURATOR, BIOPSY:  - Metastatic carcinoma in (1) of (1) lymph node.   C. LYMPH NODES, LEFT PELVIC, DISSECTION:  - Metastatic carcinoma in (1) of (3) lymph nodes.   D. LYMPH NODE, RIGHT EXTERNAL AND COMMON ILIAC, BIOPSY:  - Metastatic carcinoma in (1) of (1) lymph node.   E. UTERUS, CERVIX AND BILATERAL FALLOPIAN TUBES AND OVARIES, TOTAL  HYSTERECTOMY AND BILATERAL SALPINGO-OOPHORECTOMY:  - High grade serous carcinoma of endometrium, with invasion more than  half of the myometrium.  - Tumor invades the stromal connective tissue of the cervix.  - No involvement of uterine serosa or adnexa.  - Lymphovascular invasion is identified.  - See oncology table.   F. OMENTUM, OMENTECTOMY:  - Omentum, negative for carcinoma.   G. LYMPH NODES, RIGHT PELVIC, DISSECTION:  - Two lymph nodes, negative for carcinoma (0/2).   ONCOLOGY TABLE:   UTERUS, CARCINOMA OR CARCINOSARCOMA: Resection   Procedure: Total hysterectomy and  bilateral salpingo-oophorectomy,  Omentectomy, Lymph node sampling  Histologic Type: Serous carcinoma  Histologic Grade: High grade  Myometrial Invasion: > 50%  Uterine Serosa Involvement: Not identified  Cervical Stroma Involvement: Present  Other Tissue/Organ Involvement: Not identified  Peritoneal/Ascitic Fluid: Negative for carcinoma  Lymphovascular Invasion: Present  Regional Lymph Nodes:       Pelvic Lymph Nodes Examined:            2 Sentinel            6 Non-Sentinel            8 Total       Pelvic Lymph Nodes with Metastasis: 4            Macrometastasis: 3            Micrometastasis: 1            Isolated Tumor Cells: 0            Laterality of Lymph Nodes with Tumor: Right (non-sentinel),  Left (sentinel and non-sentinel)            Extracapsular Extension: Present       Para-Aortic Lymph Nodes Examined: Not applicable        Para-Aortic Lymph Nodes with Metastasis: Not applicable  Distant Metastasis:       Distant Site(s) Involved: Omentum: Not involved  Pathologic Stage Classification (pTNM, AJCC 8th Edition): pT2, pN1a  Ancillary  Studies: HER2 will be ordered  Additional Findings: Leiomyomata.  Adenomyosis.  Representative Tumor Block: B1  Comment: Dr. Saralyn Pilar reviewed select slides.  (v4.2.0.1)    01/29/2021 Cancer Staging   Staging form: Corpus Uteri - Carcinoma and Carcinosarcoma, AJCC 8th Edition - Clinical stage from 01/29/2021: FIGO Stage IIIC1 (cT1b, cN1a, cM0) - Signed by Lafonda Mosses, MD on 02/02/2021 Histopathologic type: Mixed cell adenocarcinoma Stage prefix: Initial diagnosis Method of lymph node assessment: Other Histologic grade (G): G3 Histologic grading system: 3 grade system Lymph-vascular invasion (LVI): LVI present/identified, NOS Peritoneal cytology results: Negative Pelvic nodal status: Positive Number of pelvic nodes positive from dissection: 4 Number of pelvic nodes examined during dissection: 8 Para-aortic status: Not  assessed Lymph node metastasis: Present Omentectomy performed: Yes Morcellation performed: No   02/19/2021 Echocardiogram    1. Left ventricular ejection fraction, by estimation, is 55 to 60%. The left ventricle has normal function. The left ventricle demonstrates regional wall motion abnormalities (see scoring diagram/findings for description). There is mild left ventricular  hypertrophy. Left ventricular diastolic parameters are consistent with Grade I diastolic dysfunction (impaired relaxation). There is moderate hypokinesis of the left ventricular, basal septal wall and inferior wall. The average left ventricular global longitudinal strain is -17.9 %. The global longitudinal strain is abnormal.  2. Right ventricular systolic function is normal. The right ventricular size is normal. There is normal pulmonary artery systolic pressure. The estimated right ventricular systolic pressure is 02.2 mmHg.  3. The mitral valve is abnormal. Trivial mitral valve regurgitation.  4. The aortic valve is tricuspid. Aortic valve regurgitation is not visualized.  5. The inferior vena cava is normal in size with greater than 50% respiratory variability, suggesting right atrial pressure of 3 mmHg.   02/20/2021 Procedure   Successful placement of a right IJ approach Power Port with ultrasound and fluoroscopic guidance. The catheter is ready for use.   02/28/2021 -  Chemotherapy    Patient is on Treatment Plan: UTERINE CARBOPLATIN AUC 6 / PACLITAXEL Q21D        REVIEW OF SYSTEMS:   Constitutional: Denies fevers, chills or abnormal weight loss Eyes: Denies blurriness of vision Ears, nose, mouth, throat, and face: Denies mucositis or sore throat Respiratory: Denies cough, dyspnea or wheezes Cardiovascular: Denies palpitation, chest discomfort or lower extremity swelling Gastrointestinal:  Denies nausea, heartburn or change in bowel habits Skin: Denies abnormal skin rashes Lymphatics: Denies new lymphadenopathy  or easy bruising Neurological:Denies numbness, tingling or new weaknesses Behavioral/Psych: Mood is stable, no new changes  All other systems were reviewed with the patient and are negative.  I have reviewed the past medical history, past surgical history, social history and family history with the patient and they are unchanged from previous note.  ALLERGIES:  has No Known Allergies.  MEDICATIONS:  Current Outpatient Medications  Medication Sig Dispense Refill  . acetaminophen (TYLENOL) 500 MG tablet Take 1 tablet (500 mg total) by mouth every 6 (six) hours as needed. 30 tablet 0  . aspirin EC 81 MG EC tablet Take 1 tablet (81 mg total) by mouth daily.    Marland Kitchen atorvastatin (LIPITOR) 80 MG tablet TAKE 1 TABLET (80 MG TOTAL) BY MOUTH AT BEDTIME. 30 tablet 6  . bismuth subsalicylate (PEPTO BISMOL) 262 MG/15ML suspension Take 30 mLs by mouth every 6 (six) hours as needed for indigestion or diarrhea or loose stools.    . clopidogrel (PLAVIX) 75 MG tablet Take 1 tablet by mouth daily with breakfast 30 tablet 6  .  dexamethasone (DECADRON) 4 MG tablet TAKE 2 TABLETS BY MOUTH THE NIGHT BEFORE CHEMOTHERAPY, EVERY 3 WEEKS, X 6 CYCLES 12 tablet 6  . doxazosin (CARDURA) 2 MG tablet TAKE 1 TABLET BY MOUTH DAILY. 30 tablet 1  . Dulaglutide (TRULICITY) 1.5 KN/3.9JQ SOPN Inject 1.5 mg into the skin once a week. (Patient taking differently: Inject 1.5 mg into the skin every Friday.) 4 pen 3  . furosemide (LASIX) 20 MG tablet Take 2 tablets (40 mg total) by mouth daily. Please make PCP visit. 60 tablet 0  . glimepiride (AMARYL) 4 MG tablet TAKE 2 TABLETS (8 MG TOTAL) BY MOUTH DAILY WITH BREAKFAST. 60 tablet 6  . Insulin Pen Needle (TRUEPLUS PEN NEEDLES) 32G X 4 MM MISC Use as directed to inject victoza once daily (Patient not taking: Reported on 02/26/2021) 100 each 3  . isosorbide mononitrate (IMDUR) 30 MG 24 hr tablet Take 2 tablets (60 mg total) by mouth daily. Please make PCP appointment. 60 tablet 0  .  lidocaine-prilocaine (EMLA) cream APPLY TO AFFECTED AREA ONCE AS DIRECTED 30 g 3  . lisinopril (ZESTRIL) 20 MG tablet TAKE 1 TABLET BY MOUTH ONCE A DAY 30 tablet 9  . metFORMIN (GLUCOPHAGE) 500 MG tablet Take 2 tablets (1,000 mg total) by mouth 2 (two) times daily with a meal. Please make PCP appt. 120 tablet 0  . metoprolol tartrate (LOPRESSOR) 100 MG tablet TAKE 1 TABLET (100 MG TOTAL) BY MOUTH 2 (TWO) TIMES DAILY. 60 tablet 6  . nitroGLYCERIN (NITROSTAT) 0.4 MG SL tablet Place 1 tablet (0.4 mg total) under the tongue every 5 (five) minutes as needed for chest pain. (Patient not taking: Reported on 02/26/2021) 25 tablet 2  . ondansetron (ZOFRAN) 8 MG tablet TAKE 1 TABLET BY MOUTH EVERY 8 HOURS AS NEEDED. (Patient not taking: Reported on 02/26/2021) 30 tablet 1  . pantoprazole (PROTONIX) 40 MG tablet TAKE 1 TABLET (40 MG TOTAL) BY MOUTH DAILY. (Patient not taking: Reported on 02/26/2021) 30 tablet 6  . prochlorperazine (COMPAZINE) 10 MG tablet TAKE 1 TABLET BY MOUTH EVERY 6 HOURS AS NEEDED (NAUSEA OR VOMITING). (Patient not taking: Reported on 02/26/2021) 30 tablet 1  . senna-docusate (SENOKOT-S) 8.6-50 MG tablet TAKE 2 TABLETS BY MOUTH AT BEDTIME. FOR AFTER SURGERY, DO NOT TAKE IF HAVING DIARRHEA 30 tablet 0  . traMADol (ULTRAM) 50 MG tablet Take 1 tablet (50 mg total) by mouth every 6 (six) hours as needed. 30 tablet 0   No current facility-administered medications for this visit.   Facility-Administered Medications Ordered in Other Visits  Medication Dose Route Frequency Provider Last Rate Last Admin  . CARBOplatin (PARAPLATIN) 630 mg in sodium chloride 0.9 % 250 mL chemo infusion  630 mg Intravenous Once Alvy Bimler, Truth Wolaver, MD      . heparin lock flush 100 unit/mL  500 Units Intracatheter Once PRN Alvy Bimler, Deron Poole, MD      . PACLitaxel (TAXOL) 288 mg in sodium chloride 0.9 % 250 mL chemo infusion (> 8m/m2)  140 mg/m2 (Treatment Plan Recorded) Intravenous Once GAlvy Bimler Donasia Wimes, MD 99 mL/hr at 04/13/21 1324 288 mg  at 04/13/21 1324  . sodium chloride flush (NS) 0.9 % injection 10 mL  10 mL Intracatheter PRN GAlvy Bimler Kenyatte Gruber, MD        PHYSICAL EXAMINATION: ECOG PERFORMANCE STATUS: 1 - Symptomatic but completely ambulatory  Vitals:   04/13/21 1027  BP: (!) 159/74  Pulse: 66  Resp: 16  Temp: (!) 97.3 F (36.3 C)  SpO2: 100%   Filed Weights  04/13/21 1027  Weight: 214 lb 4.8 oz (97.2 kg)    GENERAL:alert, no distress and comfortable SKIN: skin color, texture, turgor are normal, no rashes or significant lesions EYES: normal, Conjunctiva are pink and non-injected, sclera clear OROPHARYNX:no exudate, no erythema and lips, buccal mucosa, and tongue normal  NECK: supple, thyroid normal size, non-tender, without nodularity LYMPH:  no palpable lymphadenopathy in the cervical, axillary or inguinal LUNGS: clear to auscultation and percussion with normal breathing effort HEART: regular rate & rhythm and no murmurs and no lower extremity edema ABDOMEN:abdomen soft, non-tender and normal bowel sounds Musculoskeletal:no cyanosis of digits and no clubbing  NEURO: alert & oriented x 3 with fluent speech, no focal motor/sensory deficits  LABORATORY DATA:  I have reviewed the data as listed    Component Value Date/Time   NA 143 04/13/2021 1000   NA 143 10/18/2020 1028   K 3.2 (L) 04/13/2021 1000   CL 106 04/13/2021 1000   CO2 25 04/13/2021 1000   GLUCOSE 92 04/13/2021 1000   BUN 17 04/13/2021 1000   BUN 13 10/18/2020 1028   CREATININE 0.87 04/13/2021 1000   CREATININE 0.79 02/11/2017 0943   CALCIUM 9.0 04/13/2021 1000   PROT 6.7 04/13/2021 1000   PROT 6.7 10/18/2020 1028   ALBUMIN 3.3 (L) 04/13/2021 1000   ALBUMIN 4.1 10/18/2020 1028   AST 15 04/13/2021 1000   ALT 14 04/13/2021 1000   ALKPHOS 102 04/13/2021 1000   BILITOT 0.3 04/13/2021 1000   GFRNONAA >60 04/13/2021 1000   GFRNONAA 83 02/11/2017 0943   GFRAA 70 10/18/2020 1028   GFRAA >89 02/11/2017 0943    No results found for: SPEP,  UPEP  Lab Results  Component Value Date   WBC 5.9 04/13/2021   NEUTROABS 2.4 04/13/2021   HGB 10.7 (L) 04/13/2021   HCT 32.4 (L) 04/13/2021   MCV 91.0 04/13/2021   PLT 200 04/13/2021      Chemistry      Component Value Date/Time   NA 143 04/13/2021 1000   NA 143 10/18/2020 1028   K 3.2 (L) 04/13/2021 1000   CL 106 04/13/2021 1000   CO2 25 04/13/2021 1000   BUN 17 04/13/2021 1000   BUN 13 10/18/2020 1028   CREATININE 0.87 04/13/2021 1000   CREATININE 0.79 02/11/2017 0943      Component Value Date/Time   CALCIUM 9.0 04/13/2021 1000   ALKPHOS 102 04/13/2021 1000   AST 15 04/13/2021 1000   ALT 14 04/13/2021 1000   BILITOT 0.3 04/13/2021 1000

## 2021-04-13 NOTE — Assessment & Plan Note (Signed)
She tolerated recent chemotherapy well without major side effects She is concerned whether she is responding to treatment or not I recommend CT imaging for further assessment after 2 days treatment

## 2021-04-20 ENCOUNTER — Other Ambulatory Visit: Payer: Self-pay

## 2021-04-20 ENCOUNTER — Ambulatory Visit (HOSPITAL_COMMUNITY)
Admission: RE | Admit: 2021-04-20 | Discharge: 2021-04-20 | Disposition: A | Discharge status: Home / Self Care | Payer: Self-pay | Source: Ambulatory Visit | Attending: Hematology and Oncology | Admitting: Hematology and Oncology

## 2021-04-20 DIAGNOSIS — C541 Malignant neoplasm of endometrium: Secondary | ICD-10-CM

## 2021-04-20 DIAGNOSIS — M25561 Pain in right knee: Secondary | ICD-10-CM

## 2021-04-20 DIAGNOSIS — M25562 Pain in left knee: Secondary | ICD-10-CM | POA: Insufficient documentation

## 2021-04-20 MED ORDER — IOHEXOL 300 MG/ML  SOLN
100.0000 mL | Freq: Once | INTRAMUSCULAR | Status: AC | PRN
  Administered 2021-04-20: 100 mL via INTRAVENOUS

## 2021-04-20 MED ORDER — SODIUM CHLORIDE (PF) 0.9 % IJ SOLN
INTRAMUSCULAR | Status: AC
  Filled 2021-04-20: qty 50

## 2021-04-24 ENCOUNTER — Inpatient Hospital Stay: Payer: Self-pay | Attending: Gynecologic Oncology | Admitting: Hematology and Oncology

## 2021-04-24 ENCOUNTER — Encounter: Payer: Self-pay | Admitting: Hematology and Oncology

## 2021-04-24 DIAGNOSIS — Z7982 Long term (current) use of aspirin: Secondary | ICD-10-CM | POA: Insufficient documentation

## 2021-04-24 DIAGNOSIS — Z5111 Encounter for antineoplastic chemotherapy: Secondary | ICD-10-CM | POA: Insufficient documentation

## 2021-04-24 DIAGNOSIS — Z79899 Other long term (current) drug therapy: Secondary | ICD-10-CM | POA: Insufficient documentation

## 2021-04-24 DIAGNOSIS — N183 Chronic kidney disease, stage 3 unspecified: Secondary | ICD-10-CM | POA: Insufficient documentation

## 2021-04-24 DIAGNOSIS — D6481 Anemia due to antineoplastic chemotherapy: Secondary | ICD-10-CM | POA: Insufficient documentation

## 2021-04-24 DIAGNOSIS — C541 Malignant neoplasm of endometrium: Secondary | ICD-10-CM | POA: Insufficient documentation

## 2021-04-24 NOTE — Progress Notes (Signed)
HEMATOLOGY-ONCOLOGY ELECTRONIC VISIT PROGRESS NOTE  Patient Care Team: Elizabeth Rakes, MD as PCP - General (Family Medicine) [name], Elizabeth Gobble, MD as PCP - Cardiology (Cardiology)  I connected with by [facility] video conference and verified that I am speaking with the correct person using two identifiers.  I discussed the limitations, risks, security and privacy concerns of performing an evaluation and management service by [facility] and the availability of in person appointments.  I also discussed with the patient that there may be a patient responsible charge related to this service. The patient expressed understanding and agreed to proceed.   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   INTERVAL HISTORY: Please see below for problem oriented charting. 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 ENDOCERVIX, CURETTAGE:  - Adenocarcinoma.  B. ENDOMETRIUM, BIOPSY:  - Adenocarcinoma.  COMMENT:  The differential diagnosis includes high grade  serous carcinoma.  Both  specimens have a similar morphology; high grade serous carcinoma more  commonly arises in the endometrium.  Results reported to [name]  on 01/15/2021.  [name] reviewed the [name].    01/16/2021 Tumor Marker   Patient's tumor was tested for the following markers: CA-125 Results of the tumor marker test revealed normal value, 10.7   01/25/2021 Imaging   CT C/A/P: 1. Small volume fluid in the endometrial canal with 7 mm hypoattenuating lesion in the myometrium of the anterior fundus. 2. No definite evidence for metastatic disease in the chest, abdomen, or pelvis. Small lymph nodes are noted along both pelvic sidewalls. Attention on follow-up recommended. 3. 5 mm ground-glass opacity peripheral left upper lobe. This is likely related to infection/inflammation and potentially scar. Attention on surveillance imaging recommended. 4. 5.8 cm lesion posterior lower uterine segment likely a fibroid. Ultrasound exam from 01/28/2010 demonstrated a 5.2 cm lesion in the left aspect of the posterior lower uterine body. 5. Aortic Atherosclerosis (ICD10-I70.0).   01/29/2021 Surgery   TRH/BSO, SLN biopsy left, selective pelvic LND right, omentectomy  Findings: On EUA, 10-12cm enlarged mobile uterus. On intra-abdomina entry, minimal adhesions between the liver and anterior abdomen on the right. Some changes c/w fatty liver on the left. Omentum, stomach, small and large bowel all grossly normal. Bilateral ovaries normal appearing. Uterus with 5-6cm posterior fibroid, otherwise normal appearing. No mapping to right pelvis. On left, mapping to the level of the superior vessel artery, enlarged lymph node noted (likely sentinel) within the upper aspect of the obturator space. In bilateral pelvic basins, multiple enlarged lymph nodes. On the right, enlarged lymph nodes extended superiorly along the common  iliac vessels. At the end of surgery, no obvious abdominal or pelvic evidence  of disease.   01/29/2021 Pathology Results   A. SENTINEL LYMPH NODE, LEFT INTERNAL ILIAC, BIOPSY:  - Metastatic carcinoma in (1) of (1) lymph node.   B. SENTINEL LYMPH NODE, LEFT OBTURATOR, BIOPSY:  - Metastatic carcinoma in (1) of (1) lymph node.   C. LYMPH NODES, LEFT PELVIC, DISSECTION:  - Metastatic carcinoma in (1) of (3) lymph nodes.   D. LYMPH NODE, RIGHT EXTERNAL AND COMMON ILIAC, BIOPSY:  - Metastatic carcinoma in (1) of (1) lymph node.   E. UTERUS, CERVIX AND BILATERAL FALLOPIAN TUBES AND OVARIES, TOTAL  HYSTERECTOMY AND BILATERAL SALPINGO-OOPHORECTOMY:  - High grade serous carcinoma of endometrium, with invasion more than  half of the myometrium.  - Tumor invades the stromal connective tissue of the cervix.  - No involvement of uterine serosa or adnexa.  - Lymphovascular invasion is identified.  - See oncology table.   F. OMENTUM, OMENTECTOMY:  - Omentum, negative for carcinoma.   G. LYMPH NODES, RIGHT PELVIC, DISSECTION:  - Two lymph nodes, negative for carcinoma (0/2).   ONCOLOGY TABLE:   UTERUS, CARCINOMA OR CARCINOSARCOMA: Resection   Procedure: Total hysterectomy and bilateral salpingo-oophorectomy,  Omentectomy, Lymph node sampling  Histologic Type: Serous carcinoma  Histologic Grade: High grade  Myometrial Invasion: > 50%  Uterine Serosa Involvement: Not identified  Cervical Stroma Involvement: Present  Other Tissue/Organ Involvement: Not identified  Peritoneal/Ascitic Fluid: Negative for carcinoma  Lymphovascular Invasion: Present  Regional Lymph Nodes:       Pelvic Lymph Nodes Examined:            2 Sentinel            6 Non-Sentinel            8 Total       Pelvic Lymph Nodes with Metastasis: 4            Macrometastasis: 3            Micrometastasis: 1            Isolated Tumor Cells: 0            Laterality of Lymph Nodes with Tumor: Right (non-sentinel),  Left (sentinel and non-sentinel)            Extracapsular Extension: Present        Para-Aortic Lymph Nodes Examined: Not applicable        Para-Aortic Lymph Nodes with Metastasis: Not applicable  Distant Metastasis:       Distant Site(s) Involved: Omentum: Not involved  Pathologic Stage Classification (pTNM, AJCC 8th Edition): pT2, pN1a  Ancillary Studies: HER2 will be ordered  Additional Findings: Leiomyomata.  Adenomyosis.  Representative Tumor Block: B1  Comment: [name] reviewed select slides.  (v4.2.0.1)    01/29/2021 Cancer Staging   Staging form: Corpus Uteri - Carcinoma and Carcinosarcoma, AJCC 8th Edition - Clinical stage from 01/29/2021: FIGO Stage IIIC1 (cT1b, cN1a, cM0) - Signed by Lafonda Mosses, MD on 02/02/2021 Histopathologic type: Mixed cell adenocarcinoma Stage prefix: Initial diagnosis Method of lymph node assessment: Other Histologic grade (G): G3 Histologic grading system: 3 grade system Lymph-vascular invasion (LVI): LVI present/identified, NOS Peritoneal cytology results: Negative Pelvic nodal status: Positive Number of pelvic nodes positive from dissection: 4 Number of pelvic nodes examined during dissection: 8 Para-aortic status: Not assessed Lymph node metastasis: Present Omentectomy performed: Yes Morcellation performed: No   02/19/2021 Echocardiogram    1. Left ventricular ejection  fraction, by estimation, is 55 to 60%. The left ventricle has normal function. The left ventricle demonstrates regional wall motion abnormalities (see scoring diagram/findings for description). There is mild left ventricular  hypertrophy. Left ventricular diastolic parameters are consistent with Grade I diastolic dysfunction (impaired relaxation). There is moderate hypokinesis of the left ventricular, basal septal wall and inferior wall. The average left ventricular global longitudinal strain is -17.9 %. The global longitudinal strain is abnormal.  2. Right ventricular systolic function is normal. The right ventricular size is normal. There is normal  pulmonary artery systolic pressure. The estimated right ventricular systolic pressure is 90.3 mmHg.  3. The mitral valve is abnormal. Trivial mitral valve regurgitation.  4. The aortic valve is tricuspid. Aortic valve regurgitation is not visualized.  5. The inferior vena cava is normal in size with greater than 50% respiratory variability, suggesting right atrial pressure of 3 mmHg.   02/20/2021 Procedure   Successful placement of a right IJ approach Power Port with ultrasound and fluoroscopic guidance. The catheter is ready for use.   02/28/2021 -  Chemotherapy    Patient is on Treatment Plan: UTERINE CARBOPLATIN AUC 6 / PACLITAXEL Q21D      04/20/2021 Imaging   Status post hysterectomy and suspected bilateral salpingo-oophorectomy.   2.2 cm fluid density lesion along the left pelvic sidewall likely reflects a postoperative seroma.   No findings suspicious for recurrent or metastatic disease.     REVIEW OF SYSTEMS:   Constitutional: Denies fevers, chills or abnormal weight loss Eyes: Denies blurriness of vision Ears, nose, mouth, throat, and face: Denies mucositis or sore throat Respiratory: Denies cough, dyspnea or wheezes Cardiovascular: Denies palpitation, chest discomfort Gastrointestinal:  Denies nausea, heartburn or change in bowel habits Skin: Denies abnormal skin rashes Lymphatics: Denies new lymphadenopathy or easy bruising Neurological:Denies numbness, tingling or new weaknesses Behavioral/Psych: Mood is stable, no new changes  Extremities: No lower extremity edema All other systems were reviewed with the patient and are negative.  I have reviewed the past medical history, past surgical history, social history and family history with the patient and they are unchanged from previous note.  ALLERGIES:  has No Known Allergies.  MEDICATIONS:  Current Outpatient Medications  Medication Sig Dispense Refill  . acetaminophen (TYLENOL) 500 MG tablet Take 1 tablet (500 mg  total) by mouth every 6 (six) hours as needed. 30 tablet 0  . aspirin EC 81 MG EC tablet Take 1 tablet (81 mg total) by mouth daily.    Marland Kitchen atorvastatin (LIPITOR) 80 MG tablet TAKE 1 TABLET (80 MG TOTAL) BY MOUTH AT BEDTIME. 30 tablet 6  . bismuth subsalicylate (PEPTO BISMOL) 262 MG/15ML suspension Take 30 mLs by mouth every 6 (six) hours as needed for indigestion or diarrhea or loose stools.    . clopidogrel (PLAVIX) 75 MG tablet Take 1 tablet by mouth daily with breakfast 30 tablet 6  . dexamethasone (DECADRON) 4 MG tablet TAKE 2 TABLETS BY MOUTH THE NIGHT BEFORE CHEMOTHERAPY, EVERY 3 WEEKS, X 6 CYCLES 12 tablet 6  . doxazosin (CARDURA) 2 MG tablet TAKE 1 TABLET BY MOUTH DAILY. 30 tablet 1  . Dulaglutide (TRULICITY) 1.5 ES/9.2ZR SOPN Inject 1.5 mg into the skin once a week. (Patient taking differently: Inject 1.5 mg into the skin every Friday.) 4 pen 3  . furosemide (LASIX) 20 MG tablet Take 2 tablets (40 mg total) by mouth daily. Please make PCP visit. 60 tablet 0  . glimepiride (AMARYL) 4 MG tablet TAKE 2 TABLETS (8  MG TOTAL) BY MOUTH DAILY WITH BREAKFAST. 60 tablet 6  . Insulin Pen Needle (TRUEPLUS PEN NEEDLES) 32G X 4 MM MISC Use as directed to inject victoza once daily (Patient not taking: Reported on 02/26/2021) 100 each 3  . isosorbide mononitrate (IMDUR) 30 MG 24 hr tablet Take 2 tablets (60 mg total) by mouth daily. Please make PCP appointment. 60 tablet 0  . lidocaine-prilocaine (EMLA) cream APPLY TO AFFECTED AREA ONCE AS DIRECTED 30 g 3  . lisinopril (ZESTRIL) 20 MG tablet TAKE 1 TABLET BY MOUTH ONCE A DAY 30 tablet 9  . metFORMIN (GLUCOPHAGE) 500 MG tablet Take 2 tablets (1,000 mg total) by mouth 2 (two) times daily with a meal. Please make PCP appt. 120 tablet 0  . metoprolol tartrate (LOPRESSOR) 100 MG tablet TAKE 1 TABLET (100 MG TOTAL) BY MOUTH 2 (TWO) TIMES DAILY. 60 tablet 6  . nitroGLYCERIN (NITROSTAT) 0.4 MG SL tablet Place 1 tablet (0.4 mg total) under the tongue every 5 (five)  minutes as needed for chest pain. (Patient not taking: Reported on 02/26/2021) 25 tablet 2  . ondansetron (ZOFRAN) 8 MG tablet TAKE 1 TABLET BY MOUTH EVERY 8 HOURS AS NEEDED. (Patient not taking: Reported on 02/26/2021) 30 tablet 1  . pantoprazole (PROTONIX) 40 MG tablet TAKE 1 TABLET (40 MG TOTAL) BY MOUTH DAILY. (Patient not taking: Reported on 02/26/2021) 30 tablet 6  . prochlorperazine (COMPAZINE) 10 MG tablet TAKE 1 TABLET BY MOUTH EVERY 6 HOURS AS NEEDED (NAUSEA OR VOMITING). (Patient not taking: Reported on 02/26/2021) 30 tablet 1  . senna-docusate (SENOKOT-S) 8.6-50 MG tablet TAKE 2 TABLETS BY MOUTH AT BEDTIME. FOR AFTER SURGERY, DO NOT TAKE IF HAVING DIARRHEA 30 tablet 0  . traMADol (ULTRAM) 50 MG tablet Take 1 tablet (50 mg total) by mouth every 6 (six) hours as needed. 30 tablet 0   No current facility-administered medications for this visit.    PHYSICAL EXAMINATION: ECOG PERFORMANCE STATUS: 0 - Asymptomatic  LABORATORY DATA:  I have reviewed the data as listed CMP Latest Ref Rng & Units 04/13/2021 03/22/2021 02/26/2021  Glucose 70 - 99 mg/dL 92 250(H) 124(H)  BUN 8 - 23 mg/dL 17 29(H) 18  Creatinine 0.44 - 1.00 mg/dL 0.87 1.30(H) 1.12(H)  Sodium 135 - 145 mmol/L 143 142 141  Potassium 3.5 - 5.1 mmol/L 3.2(L) 4.0 3.8  Chloride 98 - 111 mmol/L 106 107 108  CO2 22 - 32 mmol/L 25 19(L) 25  Calcium 8.9 - 10.3 mg/dL 9.0 8.7(L) 9.5  Total Protein 6.5 - 8.1 g/dL 6.7 7.0 7.3  Total Bilirubin 0.3 - 1.2 mg/dL 0.3 0.3 0.4  Alkaline Phos 38 - 126 U/L 102 105 105  AST 15 - 41 U/L 15 12(L) 12(L)  ALT 0 - 44 U/L '14 16 15    ' Lab Results  Component Value Date   WBC 5.9 04/13/2021   HGB 10.7 (L) 04/13/2021   HCT 32.4 (L) 04/13/2021   MCV 91.0 04/13/2021   PLT 200 04/13/2021   NEUTROABS 2.4 04/13/2021     RADIOGRAPHIC STUDIES: I have reviewed CT imaging with the patient I have personally reviewed the radiological images as listed and agreed with the findings in the report. CT ABDOMEN  PELVIS W CONTRAST  Result Date: 04/20/2021 CLINICAL DATA:  Follow-up endometrial cancer EXAM: CT ABDOMEN AND PELVIS WITH CONTRAST TECHNIQUE: Multidetector CT imaging of the abdomen and pelvis was performed using the standard protocol following bolus administration of intravenous contrast. CONTRAST:  162m OMNIPAQUE IOHEXOL 300 MG/ML  SOLN COMPARISON:  CT abdomen/pelvis dated 01/25/2021 FINDINGS: Lower chest: Lung bases are clear. Hepatobiliary: Liver is within normal limits. Gallbladder is unremarkable. No intrahepatic or extrahepatic ductal dilatation. Pancreas: Within normal limits. 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 Adrenals/Urinary Tract: Adrenal glands are within normal limits. Kidneys are within normal limits.  No hydronephrosis. Thick-walled bladder, although underdistended. Stomach/Bowel: Stomach is within normal limits. No evidence of bowel obstruction. Normal appendix (series 2/image 47). Mild left colonic diverticulosis, without evidence of diverticulitis. Vascular/Lymphatic: No evidence of abdominal aortic aneurysm. Atherosclerotic calcifications of the abdominal aorta and branch vessels. No suspicious abdominopelvic lymphadenopathy. 2.2 cm fluid density lesion along the left pelvic sidewall (series 2/image 73) likely reflects a postoperative seroma. Reproductive: Status post hysterectomy. No adnexal masses. Other: No abdominopelvic ascites. No peritoneal nodularity or frank omental caking. Musculoskeletal: Degenerative changes of the lower thoracic spine. IMPRESSION: Status post hysterectomy and suspected bilateral salpingo-oophorectomy. 2.2 cm fluid density lesion along the left pelvic sidewall likely reflects a postoperative seroma. No findings suspicious for recurrent or metastatic disease. Electronically Signed   By: Julian Hy M.D.   On: 04/20/2021 16:10    I discussed the  assessment and treatment plan with the patient. The patient was provided an opportunity to ask questions and all were answered. The patient agreed with the plan and demonstrated an understanding of the instructions. The patient was advised to call back or seek an in-person evaluation if the symptoms worsen or if the condition fails to improve as anticipated.    I spent 20 minutes for the appointment reviewing test results, discuss management and coordination of care.  Heath Lark, MD 04/24/2021 9:42 AM

## 2021-04-24 NOTE — Assessment & Plan Note (Signed)
I have reviewed multiple imaging studies with the patient and her daughter She has no residual signs of disease Previously noted lymphadenopathy and no longer detectable She has small little fluid collection in the left pelvic region, likely postop in nature I plan to repeat another CT scan at the conclusion of her treatment I explained to the patient the rationale of pursuing both chemotherapy and radiation treatment with curative intent

## 2021-05-03 ENCOUNTER — Inpatient Hospital Stay (HOSPITAL_BASED_OUTPATIENT_CLINIC_OR_DEPARTMENT_OTHER): Payer: Self-pay | Admitting: Hematology and Oncology

## 2021-05-03 ENCOUNTER — Other Ambulatory Visit: Payer: Self-pay

## 2021-05-03 ENCOUNTER — Inpatient Hospital Stay: Payer: Self-pay

## 2021-05-03 ENCOUNTER — Encounter: Payer: Self-pay | Admitting: Hematology and Oncology

## 2021-05-03 ENCOUNTER — Other Ambulatory Visit: Payer: Self-pay | Admitting: Hematology and Oncology

## 2021-05-03 DIAGNOSIS — C541 Malignant neoplasm of endometrium: Secondary | ICD-10-CM

## 2021-05-03 DIAGNOSIS — N183 Chronic kidney disease, stage 3 unspecified: Secondary | ICD-10-CM

## 2021-05-03 DIAGNOSIS — D6481 Anemia due to antineoplastic chemotherapy: Secondary | ICD-10-CM

## 2021-05-03 DIAGNOSIS — T451X5A Adverse effect of antineoplastic and immunosuppressive drugs, initial encounter: Secondary | ICD-10-CM

## 2021-05-03 LAB — CMP (CANCER CENTER ONLY)
ALT: 14 U/L (ref 0–44)
AST: 17 U/L (ref 15–41)
Albumin: 3.3 g/dL — ABNORMAL LOW (ref 3.5–5.0)
Alkaline Phosphatase: 108 U/L (ref 38–126)
Anion gap: 12 (ref 5–15)
BUN: 26 mg/dL — ABNORMAL HIGH (ref 8–23)
CO2: 24 mmol/L (ref 22–32)
Calcium: 9.4 mg/dL (ref 8.9–10.3)
Chloride: 105 mmol/L (ref 98–111)
Creatinine: 1.19 mg/dL — ABNORMAL HIGH (ref 0.44–1.00)
GFR, Estimated: 52 mL/min — ABNORMAL LOW (ref >60)
Glucose, Bld: 104 mg/dL — ABNORMAL HIGH (ref 70–99)
Potassium: 4 mmol/L (ref 3.5–5.1)
Sodium: 141 mmol/L (ref 135–145)
Total Bilirubin: 0.3 mg/dL (ref 0.3–1.2)
Total Protein: 6.9 g/dL (ref 6.5–8.1)

## 2021-05-03 LAB — CBC WITH DIFFERENTIAL (CANCER CENTER ONLY)
Abs Immature Granulocytes: 0.02 10*3/uL (ref 0.00–0.07)
Basophils Absolute: 0 10*3/uL (ref 0.0–0.1)
Basophils Relative: 0 %
Eosinophils Absolute: 0 10*3/uL (ref 0.0–0.5)
Eosinophils Relative: 1 %
HCT: 29.1 % — ABNORMAL LOW (ref 36.0–46.0)
Hemoglobin: 9.9 g/dL — ABNORMAL LOW (ref 12.0–15.0)
Immature Granulocytes: 0 %
Lymphocytes Relative: 35 %
Lymphs Abs: 1.6 10*3/uL (ref 0.7–4.0)
MCH: 30.9 pg (ref 26.0–34.0)
MCHC: 34 g/dL (ref 30.0–36.0)
MCV: 90.9 fL (ref 80.0–100.0)
Monocytes Absolute: 0.5 10*3/uL (ref 0.1–1.0)
Monocytes Relative: 12 %
Neutro Abs: 2.4 10*3/uL (ref 1.7–7.7)
Neutrophils Relative %: 52 %
Platelet Count: 196 10*3/uL (ref 150–400)
RBC: 3.2 MIL/uL — ABNORMAL LOW (ref 3.87–5.11)
RDW: 15.8 % — ABNORMAL HIGH (ref 11.5–15.5)
WBC Count: 4.6 10*3/uL (ref 4.0–10.5)
nRBC: 0 % (ref 0.0–0.2)

## 2021-05-03 MED ORDER — DIPHENHYDRAMINE HCL 50 MG/ML IJ SOLN
INTRAMUSCULAR | Status: AC
  Filled 2021-05-03: qty 1

## 2021-05-03 MED ORDER — SODIUM CHLORIDE 0.9% FLUSH
10.0000 mL | Freq: Once | INTRAVENOUS | Status: AC
  Administered 2021-05-03: 10 mL
  Filled 2021-05-03: qty 10

## 2021-05-03 MED ORDER — HEPARIN SOD (PORK) LOCK FLUSH 100 UNIT/ML IV SOLN
500.0000 [IU] | Freq: Once | INTRAVENOUS | Status: AC | PRN
  Administered 2021-05-03: 500 [IU]
  Filled 2021-05-03: qty 5

## 2021-05-03 MED ORDER — DEXAMETHASONE SODIUM PHOSPHATE 10 MG/ML IJ SOLN
5.0000 mg | Freq: Once | INTRAMUSCULAR | Status: AC
  Administered 2021-05-03: 5 mg via INTRAVENOUS

## 2021-05-03 MED ORDER — FAMOTIDINE 20 MG IN NS 100 ML IVPB
INTRAVENOUS | Status: AC
  Filled 2021-05-03: qty 100

## 2021-05-03 MED ORDER — SODIUM CHLORIDE 0.9 % IV SOLN
140.0000 mg/m2 | Freq: Once | INTRAVENOUS | Status: AC
  Administered 2021-05-03: 288 mg via INTRAVENOUS
  Filled 2021-05-03: qty 48

## 2021-05-03 MED ORDER — DEXAMETHASONE SODIUM PHOSPHATE 10 MG/ML IJ SOLN
INTRAMUSCULAR | Status: AC
  Filled 2021-05-03: qty 1

## 2021-05-03 MED ORDER — DIPHENHYDRAMINE HCL 50 MG/ML IJ SOLN
25.0000 mg | Freq: Once | INTRAMUSCULAR | Status: AC
Start: 2021-05-03 — End: 2021-05-03
  Administered 2021-05-03: 25 mg via INTRAVENOUS

## 2021-05-03 MED ORDER — SODIUM CHLORIDE 0.9 % IV SOLN
150.0000 mg | Freq: Once | INTRAVENOUS | Status: AC
  Administered 2021-05-03: 150 mg via INTRAVENOUS
  Filled 2021-05-03: qty 150

## 2021-05-03 MED ORDER — SODIUM CHLORIDE 0.9 % IV SOLN
500.0000 mg | Freq: Once | INTRAVENOUS | Status: AC
  Administered 2021-05-03: 500 mg via INTRAVENOUS
  Filled 2021-05-03: qty 50

## 2021-05-03 MED ORDER — SODIUM CHLORIDE 0.9 % IV SOLN
Freq: Once | INTRAVENOUS | Status: AC
  Filled 2021-05-03: qty 250

## 2021-05-03 MED ORDER — SODIUM CHLORIDE 0.9% FLUSH
10.0000 mL | INTRAVENOUS | Status: DC | PRN
  Administered 2021-05-03: 10 mL
  Filled 2021-05-03: qty 10

## 2021-05-03 MED ORDER — PALONOSETRON HCL INJECTION 0.25 MG/5ML
INTRAVENOUS | Status: AC
  Filled 2021-05-03: qty 5

## 2021-05-03 MED ORDER — PALONOSETRON HCL INJECTION 0.25 MG/5ML
0.2500 mg | Freq: Once | INTRAVENOUS | Status: AC
  Administered 2021-05-03: 0.25 mg via INTRAVENOUS

## 2021-05-03 MED ORDER — FAMOTIDINE 20 MG IN NS 100 ML IVPB
20.0000 mg | Freq: Once | INTRAVENOUS | Status: AC
  Administered 2021-05-03: 20 mg via INTRAVENOUS

## 2021-05-03 NOTE — Patient Instructions (Signed)
[facility] MEDICAL ONCOLOGY   Discharge Instructions: Thank you for choosing [facility] to provide your oncology and hematology care.   If you have a lab appointment with the Cancer Center, please go directly to the Cancer Center and check in at the registration area.   Wear comfortable clothing and clothing appropriate for easy access to any Portacath or PICC line.   We strive to give you quality time with your provider. You may need to reschedule your appointment if you arrive late (15 or more minutes).  Arriving late affects you and other patients whose appointments are after yours.  Also, if you miss three or more appointments without notifying the office, you may be dismissed from the clinic at the provider's discretion.      For prescription refill requests, have your pharmacy contact our office and allow 72 hours for refills to be completed.    Today you received the following chemotherapy and/or immunotherapy agents: paclitaxel and carboplatin.      To help prevent nausea and vomiting after your treatment, we encourage you to take your nausea medication as directed.  BELOW ARE SYMPTOMS THAT SHOULD BE REPORTED IMMEDIATELY: *FEVER GREATER THAN 100.4 F (38 C) OR HIGHER *CHILLS OR SWEATING *NAUSEA AND VOMITING THAT IS NOT CONTROLLED WITH YOUR NAUSEA MEDICATION *UNUSUAL SHORTNESS OF BREATH *UNUSUAL BRUISING OR BLEEDING *URINARY PROBLEMS (pain or burning when urinating, or frequent urination) *BOWEL PROBLEMS (unusual diarrhea, constipation, pain near the anus) TENDERNESS IN MOUTH AND THROAT WITH OR WITHOUT PRESENCE OF ULCERS (sore throat, sores in mouth, or a toothache) UNUSUAL RASH, SWELLING OR PAIN  UNUSUAL VAGINAL DISCHARGE OR ITCHING   Items with * indicate a potential emergency and should be followed up as soon as possible or go to the Emergency Department if any problems should occur.  Please show the CHEMOTHERAPY ALERT CARD or IMMUNOTHERAPY ALERT  CARD at check-in to the Emergency Department and triage nurse.  Should you have questions after your visit or need to cancel or reschedule your appointment, please contact [facility] MEDICAL ONCOLOG[facility]  Dept: [phone]  and follow the prompts.  Office hours are 8:00 a.m. to 4:30 p.m. Monday - Friday. Please note that voicemails left after 4:00 p.m. may not be returned until the following business day.  We are closed weekends and major holidays. You have access to a nurse at all times for urgent questions. Please call the main number to the clinic Dept: [phone] and follow the prompts.   For any non-urgent questions, you may also contact your provider using MyChart. We now offer e-Visits for anyone 18 and older to request care online for non-urgent symptoms. For details visit [email or web address].   Also download the MyChart app! Go to the app store, search "MyChart", open the app, select [facility], and log in with your MyChart username and password.  Due to Covid, a mask is required upon entering the hospital/clinic. If you do not have a mask, one will be given to you upon arrival. For doctor visits, patients may have 1 support person aged 18 or older with them. For treatment visits, patients cannot have anyone with them due to current Covid guidelines and our immunocompromised population.   

## 2021-05-03 NOTE — Progress Notes (Signed)
[place] OFFICE PROGRESS NOTE  Patient Care Team: [name], MD as PCP - General (Family Medicine) Croitoru, Dani Gobble, MD as PCP - Cardiology (Cardiology)  ASSESSMENT & PLAN:  Papillary serous adenocarcinoma of endometrium [facility]) Her recent CT imaging studies showed no residual signs of disease Previously noted lymphadenopathy and no longer detectable She has small little fluid collection in the left pelvic region, likely postop in nature I plan to repeat another CT scan at the conclusion of her treatment I will reach out to the radiation oncologist to see if we still need to wait to start her radiation treatment We will continue chemotherapy as scheduled  Anemia due to antineoplastic chemotherapy This is likely due to recent treatment. The patient denies recent history of bleeding such as epistaxis, hematuria or hematochezia. She is asymptomatic from the anemia. I will observe for now.  She does not require transfusion now. I will continue the chemotherapy at current dose without dosage adjustment.  If the anemia gets progressive worse in the future, I might have to delay her treatment or adjust the chemotherapy dose.   CKD (chronic kidney disease), stage III ([place]) She has slight acute on chronic renal failure secondary to probably dehydration I will adjust the dose of her chemotherapy today based on her current height, weight and serum creatinine She will continue aggressive lifestyle changes and risk factor modification   No orders of the defined types were placed in this encounter.   All questions were answered. The patient knows to call the clinic with any problems, questions or concerns. The total time spent in the appointment was 20 minutes encounter with patients including review of chart and various tests results, discussions about plan of care and coordination of care plan   Heath Lark, MD 05/03/2021 11:37 AM  INTERVAL HISTORY: Please see below for  problem oriented charting. She is seen prior to cycle 4 of treatment Since last time I talked to her, she feels well No worsening peripheral neuropathy Denies recent changes in bowel habits Appetite is stable  SUMMARY OF ONCOLOGIC HISTORY: Oncology History Overview Note  Pap 12/14/20: adenocarcinoma, HPV negative  HER-2 positive by FISH Her-2 equivocal by [facility]  MSI stable    Papillary serous adenocarcinoma of endometrium ([place])   Initial Diagnosis   Endometrial carcinoma ([place])   01/12/2021 Initial Biopsy   A. ENDOCERVIX, CURETTAGE:  - Adenocarcinoma.  B. ENDOMETRIUM, BIOPSY:  - Adenocarcinoma.  COMMENT:  The differential diagnosis includes high grade serous carcinoma.  Both  specimens have a similar morphology; high grade serous carcinoma more  commonly arises in the endometrium.  Results reported to Dr. Hale Bogus  on 01/15/2021.  Dr. Saralyn Pilar reviewed the case.    01/16/2021 Tumor Marker   Patient's tumor was tested for the following markers: CA-125 Results of the tumor marker test revealed normal value, 10.7   01/25/2021 Imaging   CT C/A/P: 1. Small volume fluid in the endometrial canal with 7 mm hypoattenuating lesion in the myometrium of the anterior fundus. 2. No definite evidence for metastatic disease in the chest, abdomen, or pelvis. Small lymph nodes are noted along both pelvic sidewalls. Attention on follow-up recommended. 3. 5 mm ground-glass opacity peripheral left upper lobe. This is likely related to infection/inflammation and potentially scar. Attention on surveillance imaging recommended. 4. 5.8 cm lesion posterior lower uterine segment likely a fibroid. Ultrasound exam from 01/28/2010 demonstrated a 5.2 cm lesion in the left aspect of the posterior lower uterine body. 5. Aortic Atherosclerosis (  ICD10-I70.0).   01/29/2021 Surgery   TRH/BSO, SLN biopsy left, selective pelvic LND right, omentectomy  Findings: On EUA, 10-12cm enlarged mobile uterus. On  intra-abdomina entry, minimal adhesions between the liver and anterior abdomen on the right. Some changes c/w fatty liver on the left. Omentum, stomach, small and large bowel all grossly normal. Bilateral ovaries normal appearing. Uterus with 5-6cm posterior fibroid, otherwise normal appearing. No mapping to right pelvis. On left, mapping to the level of the superior vessel artery, enlarged lymph node noted (likely sentinel) within the upper aspect of the obturator space. In bilateral pelvic basins, multiple enlarged lymph nodes. On the right, enlarged lymph nodes extended superiorly along the common iliac vessels. At the end of surgery, no obvious abdominal or pelvic evidence of disease.   01/29/2021 Pathology Results   A. SENTINEL LYMPH NODE, LEFT INTERNAL ILIAC, BIOPSY:  - Metastatic carcinoma in (1) of (1) lymph node.   B. SENTINEL LYMPH NODE, LEFT OBTURATOR, BIOPSY:  - Metastatic carcinoma in (1) of (1) lymph node.   C. LYMPH NODES, LEFT PELVIC, DISSECTION:  - Metastatic carcinoma in (1) of (3) lymph nodes.   D. LYMPH NODE, RIGHT EXTERNAL AND COMMON ILIAC, BIOPSY:  - Metastatic carcinoma in (1) of (1) lymph node.   E. UTERUS, CERVIX AND BILATERAL FALLOPIAN TUBES AND OVARIES, TOTAL  HYSTERECTOMY AND BILATERAL SALPINGO-OOPHORECTOMY:  - High grade serous carcinoma of endometrium, with invasion more than  half of the myometrium.  - Tumor invades the stromal connective tissue of the cervix.  - No involvement of uterine serosa or adnexa.  - Lymphovascular invasion is identified.  - See oncology table.   F. OMENTUM, OMENTECTOMY:  - Omentum, negative for carcinoma.   G. LYMPH NODES, RIGHT PELVIC, DISSECTION:  - Two lymph nodes, negative for carcinoma (0/2).   ONCOLOGY TABLE:   UTERUS, CARCINOMA OR CARCINOSARCOMA: Resection   Procedure: Total hysterectomy and bilateral salpingo-oophorectomy,  Omentectomy, Lymph node sampling  Histologic Type: Serous carcinoma  Histologic Grade: High  grade  Myometrial Invasion: > 50%  Uterine Serosa Involvement: Not identified  Cervical Stroma Involvement: Present  Other Tissue/Organ Involvement: Not identified  Peritoneal/Ascitic Fluid: Negative for carcinoma  Lymphovascular Invasion: Present  Regional Lymph Nodes:       Pelvic Lymph Nodes Examined:            2 Sentinel            6 Non-Sentinel            8 Total       Pelvic Lymph Nodes with Metastasis: 4            Macrometastasis: 3            Micrometastasis: 1            Isolated Tumor Cells: 0            Laterality of Lymph Nodes with Tumor: Right (non-sentinel),  Left (sentinel and non-sentinel)            Extracapsular Extension: Present       Para-Aortic Lymph Nodes Examined: Not applicable        Para-Aortic Lymph Nodes with Metastasis: Not applicable  Distant Metastasis:       Distant Site(s) Involved: Omentum: Not involved  Pathologic Stage Classification (pTNM, AJCC 8th Edition): pT2, pN1a  Ancillary Studies: HER2 will be ordered  Additional Findings: Leiomyomata.  Adenomyosis.  Representative Tumor Block: B1  Comment: Dr. Saralyn Pilar reviewed select slides.  (v4.2.0.1)  01/29/2021 Cancer Staging   Staging form: Corpus Uteri - Carcinoma and Carcinosarcoma, AJCC 8th Edition - Clinical stage from 01/29/2021: FIGO Stage IIIC1 (cT1b, cN1a, cM0) - Signed by Lafonda Mosses, MD on 02/02/2021 Histopathologic type: Mixed cell adenocarcinoma Stage prefix: Initial diagnosis Method of lymph node assessment: Other Histologic grade (G): G3 Histologic grading system: 3 grade system Lymph-vascular invasion (LVI): LVI present/identified, NOS Peritoneal cytology results: Negative Pelvic nodal status: Positive Number of pelvic nodes positive from dissection: 4 Number of pelvic nodes examined during dissection: 8 Para-aortic status: Not assessed Lymph node metastasis: Present Omentectomy performed: Yes Morcellation performed: No   02/19/2021 Echocardiogram    1. Left  ventricular ejection fraction, by estimation, is 55 to 60%. The left ventricle has normal function. The left ventricle demonstrates regional wall motion abnormalities (see scoring diagram/findings for description). There is mild left ventricular  hypertrophy. Left ventricular diastolic parameters are consistent with Grade I diastolic dysfunction (impaired relaxation). There is moderate hypokinesis of the left ventricular, basal septal wall and inferior wall. The average left ventricular global longitudinal strain is -17.9 %. The global longitudinal strain is abnormal.  2. Right ventricular systolic function is normal. The right ventricular size is normal. There is normal pulmonary artery systolic pressure. The estimated right ventricular systolic pressure is 54.0 mmHg.  3. The mitral valve is abnormal. Trivial mitral valve regurgitation.  4. The aortic valve is tricuspid. Aortic valve regurgitation is not visualized.  5. The inferior vena cava is normal in size with greater than 50% respiratory variability, suggesting right atrial pressure of 3 mmHg.   02/20/2021 Procedure   Successful placement of a right IJ approach Power Port with ultrasound and fluoroscopic guidance. The catheter is ready for use.   02/28/2021 -  Chemotherapy    Patient is on Treatment Plan: UTERINE CARBOPLATIN AUC 6 / PACLITAXEL Q21D      04/20/2021 Imaging   Status post hysterectomy and suspected bilateral salpingo-oophorectomy.   2.2 cm fluid density lesion along the left pelvic sidewall likely reflects a postoperative seroma.   No findings suspicious for recurrent or metastatic disease.     REVIEW OF SYSTEMS:   Constitutional: Denies fevers, chills or abnormal weight loss Eyes: Denies blurriness of vision Ears, nose, mouth, throat, and face: Denies mucositis or sore throat Respiratory: Denies cough, dyspnea or wheezes Cardiovascular: Denies palpitation, chest discomfort or lower extremity swelling Gastrointestinal:   Denies nausea, heartburn or change in bowel habits Skin: Denies abnormal skin rashes Lymphatics: Denies new lymphadenopathy or easy bruising Neurological:Denies numbness, tingling or new weaknesses Behavioral/Psych: Mood is stable, no new changes  All other systems were reviewed with the patient and are negative.  I have reviewed the past medical history, past surgical history, social history and family history with the patient and they are unchanged from previous note.  ALLERGIES:  has No Known Allergies.  MEDICATIONS:  Current Outpatient Medications  Medication Sig Dispense Refill  . acetaminophen (TYLENOL) 500 MG tablet Take 1 tablet (500 mg total) by mouth every 6 (six) hours as needed. 30 tablet 0  . aspirin EC 81 MG EC tablet Take 1 tablet (81 mg total) by mouth daily.    Marland Kitchen atorvastatin (LIPITOR) 80 MG tablet TAKE 1 TABLET (80 MG TOTAL) BY MOUTH AT BEDTIME. 30 tablet 6  . bismuth subsalicylate (PEPTO BISMOL) 262 MG/15ML suspension Take 30 mLs by mouth every 6 (six) hours as needed for indigestion or diarrhea or loose stools.    . clopidogrel (PLAVIX) 75 MG tablet Take  1 tablet by mouth daily with breakfast 30 tablet 6  . dexamethasone (DECADRON) 4 MG tablet TAKE 2 TABLETS BY MOUTH THE NIGHT BEFORE CHEMOTHERAPY, EVERY 3 WEEKS, X 6 CYCLES 12 tablet 6  . doxazosin (CARDURA) 2 MG tablet TAKE 1 TABLET BY MOUTH DAILY. 30 tablet 1  . Dulaglutide (TRULICITY) 1.5 ON/6.2XB SOPN Inject 1.5 mg into the skin once a week. (Patient taking differently: Inject 1.5 mg into the skin every Friday.) 4 pen 3  . furosemide (LASIX) 20 MG tablet Take 2 tablets (40 mg total) by mouth daily. Please make PCP visit. 60 tablet 0  . glimepiride (AMARYL) 4 MG tablet TAKE 2 TABLETS (8 MG TOTAL) BY MOUTH DAILY WITH BREAKFAST. 60 tablet 6  . Insulin Pen Needle (TRUEPLUS PEN NEEDLES) 32G X 4 MM MISC Use as directed to inject victoza once daily (Patient not taking: Reported on 02/26/2021) 100 each 3  . isosorbide  mononitrate (IMDUR) 30 MG 24 hr tablet Take 2 tablets (60 mg total) by mouth daily. Please make PCP appointment. 60 tablet 0  . lidocaine-prilocaine (EMLA) cream APPLY TO AFFECTED AREA ONCE AS DIRECTED 30 g 3  . lisinopril (ZESTRIL) 20 MG tablet TAKE 1 TABLET BY MOUTH ONCE A DAY 30 tablet 9  . metFORMIN (GLUCOPHAGE) 500 MG tablet Take 2 tablets (1,000 mg total) by mouth 2 (two) times daily with a meal. Please make PCP appt. 120 tablet 0  . metoprolol tartrate (LOPRESSOR) 100 MG tablet TAKE 1 TABLET (100 MG TOTAL) BY MOUTH 2 (TWO) TIMES DAILY. 60 tablet 6  . nitroGLYCERIN (NITROSTAT) 0.4 MG SL tablet Place 1 tablet (0.4 mg total) under the tongue every 5 (five) minutes as needed for chest pain. (Patient not taking: Reported on 02/26/2021) 25 tablet 2  . ondansetron (ZOFRAN) 8 MG tablet TAKE 1 TABLET BY MOUTH EVERY 8 HOURS AS NEEDED. (Patient not taking: Reported on 02/26/2021) 30 tablet 1  . pantoprazole (PROTONIX) 40 MG tablet TAKE 1 TABLET (40 MG TOTAL) BY MOUTH DAILY. (Patient not taking: Reported on 02/26/2021) 30 tablet 6  . prochlorperazine (COMPAZINE) 10 MG tablet TAKE 1 TABLET BY MOUTH EVERY 6 HOURS AS NEEDED (NAUSEA OR VOMITING). (Patient not taking: Reported on 02/26/2021) 30 tablet 1  . senna-docusate (SENOKOT-S) 8.6-50 MG tablet TAKE 2 TABLETS BY MOUTH AT BEDTIME. FOR AFTER SURGERY, DO NOT TAKE IF HAVING DIARRHEA 30 tablet 0  . traMADol (ULTRAM) 50 MG tablet Take 1 tablet (50 mg total) by mouth every 6 (six) hours as needed. 30 tablet 0   No current facility-administered medications for this visit.   Facility-Administered Medications Ordered in Other Visits  Medication Dose Route Frequency Provider Last Rate Last Admin  . CARBOplatin (PARAPLATIN) 500 mg in sodium chloride 0.9 % 250 mL chemo infusion  500 mg Intravenous Once Alvy Bimler, Antar Milks, MD      . famotidine (PEPCID) IVPB 20 mg in NS 100 mL IVPB  20 mg Intravenous Once Alvy Bimler, Alp Goldwater, MD 400 mL/hr at 05/03/21 1126 20 mg at 05/03/21 1126  .  heparin lock flush 100 unit/mL  500 Units Intracatheter Once PRN Alvy Bimler, Akbar Sacra, MD      . PACLitaxel (TAXOL) 288 mg in sodium chloride 0.9 % 250 mL chemo infusion (> 71m/m2)  140 mg/m2 (Treatment Plan Recorded) Intravenous Once Lejon Afzal, MD      . sodium chloride flush (NS) 0.9 % injection 10 mL  10 mL Intracatheter PRN GHeath Lark MD        PHYSICAL EXAMINATION: ECOG PERFORMANCE STATUS:  1 - Symptomatic but completely ambulatory  Vitals:   05/03/21 1028  BP: 120/70  Pulse: 80  Resp: 18  Temp: (!) 97.4 F (36.3 C)  SpO2: 100%   Filed Weights   05/03/21 1028  Weight: 212 lb 9.6 oz (96.4 kg)    GENERAL:alert, no distress and comfortable SKIN: skin color, texture, turgor are normal, no rashes or significant lesions EYES: normal, Conjunctiva are pink and non-injected, sclera clear OROPHARYNX:no exudate, no erythema and lips, buccal mucosa, and tongue normal  NECK: supple, thyroid normal size, non-tender, without nodularity LYMPH:  no palpable lymphadenopathy in the cervical, axillary or inguinal LUNGS: clear to auscultation and percussion with normal breathing effort HEART: regular rate & rhythm and no murmurs and no lower extremity edema ABDOMEN:abdomen soft, non-tender and normal bowel sounds Musculoskeletal:no cyanosis of digits and no clubbing  NEURO: alert & oriented x 3 with fluent speech, no focal motor/sensory deficits  LABORATORY DATA:  I have reviewed the data as listed    Component Value Date/Time   NA 141 05/03/2021 1001   NA 143 10/18/2020 1028   K 4.0 05/03/2021 1001   CL 105 05/03/2021 1001   CO2 24 05/03/2021 1001   GLUCOSE 104 (H) 05/03/2021 1001   BUN 26 (H) 05/03/2021 1001   BUN 13 10/18/2020 1028   CREATININE 1.19 (H) 05/03/2021 1001   CREATININE 0.79 02/11/2017 0943   CALCIUM 9.4 05/03/2021 1001   PROT 6.9 05/03/2021 1001   PROT 6.7 10/18/2020 1028   ALBUMIN 3.3 (L) 05/03/2021 1001   ALBUMIN 4.1 10/18/2020 1028   AST 17 05/03/2021 1001   ALT 14  05/03/2021 1001   ALKPHOS 108 05/03/2021 1001   BILITOT 0.3 05/03/2021 1001   GFRNONAA 52 (L) 05/03/2021 1001   GFRNONAA 83 02/11/2017 0943   GFRAA 70 10/18/2020 1028   GFRAA >89 02/11/2017 0943    No results found for: SPEP, UPEP  Lab Results  Component Value Date   WBC 4.6 05/03/2021   NEUTROABS 2.4 05/03/2021   HGB 9.9 (L) 05/03/2021   HCT 29.1 (L) 05/03/2021   MCV 90.9 05/03/2021   PLT 196 05/03/2021      Chemistry      Component Value Date/Time   NA 141 05/03/2021 1001   NA 143 10/18/2020 1028   K 4.0 05/03/2021 1001   CL 105 05/03/2021 1001   CO2 24 05/03/2021 1001   BUN 26 (H) 05/03/2021 1001   BUN 13 10/18/2020 1028   CREATININE 1.19 (H) 05/03/2021 1001   CREATININE 0.79 02/11/2017 0943      Component Value Date/Time   CALCIUM 9.4 05/03/2021 1001   ALKPHOS 108 05/03/2021 1001   AST 17 05/03/2021 1001   ALT 14 05/03/2021 1001   BILITOT 0.3 05/03/2021 1001

## 2021-05-03 NOTE — Assessment & Plan Note (Signed)

## 2021-05-03 NOTE — Assessment & Plan Note (Signed)
Her recent CT imaging studies showed no residual signs of disease Previously noted lymphadenopathy and no longer detectable She has small little fluid collection in the left pelvic region, likely postop in nature I plan to repeat another CT scan at the conclusion of her treatment I will reach out to the radiation oncologist to see if we still need to wait to start her radiation treatment We will continue chemotherapy as scheduled

## 2021-05-03 NOTE — Assessment & Plan Note (Signed)
She has slight acute on chronic renal failure secondary to probably dehydration I will adjust the dose of her chemotherapy today based on her current height, weight and serum creatinine She will continue aggressive lifestyle changes and risk factor modification 

## 2021-05-10 ENCOUNTER — Telehealth: Payer: Self-pay | Admitting: Oncology

## 2021-05-10 NOTE — Telephone Encounter (Signed)
Called [facility] and spoke to her daughter, [name].  Advised her the treatment plan will be for Brandon to have 6 cycles of chemotherapy and then start radiation.  Discussed that we will call with an appointment to see Dr. Sondra Come when Veronika is close to finishing her 6 cycle on 06/14/21.

## 2021-05-15 ENCOUNTER — Encounter: Payer: Self-pay | Admitting: Hematology and Oncology

## 2021-05-15 ENCOUNTER — Other Ambulatory Visit: Payer: Self-pay | Admitting: Family Medicine

## 2021-05-15 ENCOUNTER — Other Ambulatory Visit: Payer: Self-pay

## 2021-05-15 DIAGNOSIS — E1159 Type 2 diabetes mellitus with other circulatory complications: Secondary | ICD-10-CM

## 2021-05-15 DIAGNOSIS — I5032 Chronic diastolic (congestive) heart failure: Secondary | ICD-10-CM

## 2021-05-15 MED FILL — Atorvastatin Calcium Tab 80 MG (Base Equivalent): ORAL | 30 days supply | Qty: 30 | Fill #1 | Status: AC

## 2021-05-15 MED FILL — Metoprolol Tartrate Tab 100 MG: ORAL | 30 days supply | Qty: 60 | Fill #0 | Status: AC

## 2021-05-15 MED FILL — Glimepiride Tab 4 MG: ORAL | 30 days supply | Qty: 60 | Fill #1 | Status: AC

## 2021-05-15 MED FILL — Lisinopril Tab 20 MG: ORAL | 30 days supply | Qty: 30 | Fill #1 | Status: AC

## 2021-05-15 MED FILL — Pantoprazole Sodium EC Tab 40 MG (Base Equiv): ORAL | 30 days supply | Qty: 30 | Fill #1 | Status: AC

## 2021-05-15 NOTE — Telephone Encounter (Signed)
  Notes to clinic: medication filled by a different provider  Review for continue use and refill    Requested Prescriptions  Pending Prescriptions Disp Refills   doxazosin (CARDURA) 2 MG tablet 30 tablet 1    Sig: TAKE 1 TABLET BY MOUTH DAILY.      There is no refill protocol information for this order

## 2021-05-15 NOTE — Telephone Encounter (Signed)
Notes to clinic:  Appointment has not been scheduled since refill Review another refill  Vm left for patient    Requested Prescriptions  Pending Prescriptions Disp Refills   metFORMIN (GLUCOPHAGE) 500 MG tablet 120 tablet 0    Sig: Take 2 tablets (1,000 mg total) by mouth 2 (two) times daily with a meal. Please make PCP appt.      Endocrinology:  Diabetes - Biguanides Failed - 05/15/2021 10:01 AM      Failed - Cr in normal range and within 360 days    Creatinine  Date Value Ref Range Status  05/03/2021 1.19 (H) 0.44 - 1.00 mg/dL Final   Creat  Date Value Ref Range Status  02/11/2017 0.79 0.50 - 1.05 mg/dL Final    Comment:      For patients > or = 63 years of age: The upper reference limit for Creatinine is approximately 13% higher for people identified as African-American.      Creatinine, Urine  Date Value Ref Range Status  06/19/2019 268.44 mg/dL Final    Comment:    Performed at [facility] [address], [place], [place] [address]          Passed - HBA1C is between 0 and 7.9 and within 180 days    HbA1c, POC (controlled diabetic range)  Date Value Ref Range Status  10/18/2020 7.1 (A) 0.0 - 7.0 % Final   Hgb A1c MFr Bld  Date Value Ref Range Status  01/16/2021 7.1 (H) 4.8 - 5.6 % Final    Comment:    (NOTE) Pre diabetes:          5.7%-6.4%  Diabetes:              >6.4%  Glycemic control for   <7.0% adults with diabetes           Passed - AA eGFR in normal range and within 360 days    GFR, Est African American  Date Value Ref Range Status  02/11/2017 >89 >=60 mL/min Final   GFR calc Af Amer  Date Value Ref Range Status  10/18/2020 70 >59 mL/min/1.73 Final    Comment:    **In accordance with recommendations from the NKF-ASN Task force,**   [facility] is in the process of updating its eGFR calculation to the   2021 CKD-EPI creatinine equation that estimates kidney function   without a race variable.    GFR, Est Non African American  Date  Value Ref Range Status  02/11/2017 83 >=60 mL/min Final   GFR, Estimated  Date Value Ref Range Status  05/03/2021 52 (L) >60 mL/min Final    Comment:    (NOTE) Calculated using the CKD-EPI Creatinine Equation (2021)           Passed - Valid encounter within last 6 months    Recent Outpatient Visits           5 months ago Post-menopausal bleeding   [place], [place], MD   6 months ago Type 2 diabetes mellitus with other circulatory complication, without long-term current use of insulin ([place])   [place], [place], MD   1 year ago Type 2 diabetes mellitus with other circulatory complication, without long-term current use of insulin ([place])   [place], [name], MD   1 year ago Type 2 diabetes mellitus with other circulatory complication, without long-term current use of insulin ([place])  [place], [place], MD   2 years ago Dyslipidemia   [place], [place], MD                  isosorbide mononitrate (IMDUR) 30 MG 24 hr tablet 60 tablet 0    Sig: Take 2 tablets (60 mg total) by mouth daily. Please make PCP appointment.      Cardiovascular:  Nitrates Passed - 05/15/2021 10:01 AM      Passed - Last BP in normal range    BP Readings from Last 1 Encounters:  05/03/21 120/70          Passed - Last Heart Rate in normal range    Pulse Readings from Last 1 Encounters:  05/03/21 80          Passed - Valid encounter within last 12 months    Recent Outpatient Visits           5 months ago Post-menopausal bleeding   [place], [place], MD   6 months ago Type 2 diabetes mellitus with other circulatory complication, without long-term current use of insulin ([place])   [place], [place], MD   1 year ago Type 2  diabetes mellitus with other circulatory complication, without long-term current use of insulin ([place])   [place], [place], MD   1 year ago Type 2 diabetes mellitus with other circulatory complication, without long-term current use of insulin ([place])   [place], [place], MD   2 years ago Dyslipidemia   [place], [name], MD                  furosemide (LASIX) 20 MG tablet 60 tablet 0    Sig: Take 2 tablets (40 mg total) by mouth daily. Please make PCP visit.      Cardiovascular:  Diuretics - Loop Failed - 05/15/2021 10:01 AM      Failed - Cr in normal range and within 360 days    Creatinine  Date Value Ref Range Status  05/03/2021 1.19 (H) 0.44 - 1.00 mg/dL Final   Creat  Date Value Ref Range Status  02/11/2017 0.79 0.50 - 1.05 mg/dL Final    Comment:      For patients > or = 63 years of age: The upper reference limit for Creatinine is approximately 13% higher for people identified as African-American.      Creatinine, Urine  Date Value Ref Range Status  06/19/2019 268.44 mg/dL Final    Comment:    Performed at [facility] [address] [place], [place] [address]          Passed - K in normal range and within 360 days    Potassium  Date Value Ref Range Status  05/03/2021 4.0 3.5 - 5.1 mmol/L Final          Passed - Ca in normal range and within 360 days    Calcium  Date Value Ref Range Status  05/03/2021 9.4 8.9 - 10.3 mg/dL Final   Calcium, Ion  Date Value Ref Range Status  09/04/2008 1.15  Final          Passed - Na in normal range and within 360 days    Sodium  Date Value Ref Range Status  05/03/2021 141 135 - 145  mmol/L Final  10/18/2020 143 134 - 144 mmol/L Final          Passed - Last BP in normal range    BP Readings from Last 1 Encounters:  05/03/21 120/70          Passed - Valid encounter within last 6 months     Recent Outpatient Visits           5 months ago Post-menopausal bleeding   [place], [place], MD   6 months ago Type 2 diabetes mellitus with other circulatory complication, without long-term current use of insulin ([place])   [place], [place], MD   1 year ago Type 2 diabetes mellitus with other circulatory complication, without long-term current use of insulin ([place])   [place], [name], MD   1 year ago Type 2 diabetes mellitus with other circulatory complication, without long-term current use of insulin ([place])   [place], [place], MD   2 years ago Dyslipidemia   [facility] [name], MD

## 2021-05-16 ENCOUNTER — Other Ambulatory Visit: Payer: Self-pay

## 2021-05-17 ENCOUNTER — Telehealth: Payer: Self-pay

## 2021-05-17 NOTE — Telephone Encounter (Signed)
She called and left a message to call her.  Called back. She is asking if next treatment can be moved from 6/9 to 6/10? Offered to send a scheduling message to see if there is any availability.  She declined offer and will leave appts as scheduled,

## 2021-05-21 ENCOUNTER — Other Ambulatory Visit: Payer: Self-pay

## 2021-05-24 ENCOUNTER — Other Ambulatory Visit: Payer: Self-pay

## 2021-05-24 ENCOUNTER — Inpatient Hospital Stay: Payer: Self-pay | Attending: Gynecologic Oncology

## 2021-05-24 ENCOUNTER — Inpatient Hospital Stay: Payer: Self-pay

## 2021-05-24 ENCOUNTER — Inpatient Hospital Stay (HOSPITAL_BASED_OUTPATIENT_CLINIC_OR_DEPARTMENT_OTHER): Payer: Self-pay | Admitting: Hematology and Oncology

## 2021-05-24 ENCOUNTER — Encounter: Payer: Self-pay | Admitting: Hematology and Oncology

## 2021-05-24 DIAGNOSIS — D61818 Other pancytopenia: Secondary | ICD-10-CM | POA: Insufficient documentation

## 2021-05-24 DIAGNOSIS — C541 Malignant neoplasm of endometrium: Secondary | ICD-10-CM | POA: Insufficient documentation

## 2021-05-24 DIAGNOSIS — N183 Chronic kidney disease, stage 3 unspecified: Secondary | ICD-10-CM | POA: Insufficient documentation

## 2021-05-24 DIAGNOSIS — I10 Essential (primary) hypertension: Secondary | ICD-10-CM

## 2021-05-24 DIAGNOSIS — Z7982 Long term (current) use of aspirin: Secondary | ICD-10-CM | POA: Insufficient documentation

## 2021-05-24 DIAGNOSIS — Z79899 Other long term (current) drug therapy: Secondary | ICD-10-CM | POA: Insufficient documentation

## 2021-05-24 DIAGNOSIS — I129 Hypertensive chronic kidney disease with stage 1 through stage 4 chronic kidney disease, or unspecified chronic kidney disease: Secondary | ICD-10-CM | POA: Insufficient documentation

## 2021-05-24 DIAGNOSIS — Z5111 Encounter for antineoplastic chemotherapy: Secondary | ICD-10-CM | POA: Insufficient documentation

## 2021-05-24 LAB — CMP (CANCER CENTER ONLY)
ALT: 16 U/L (ref 0–44)
AST: 17 U/L (ref 15–41)
Albumin: 3.4 g/dL — ABNORMAL LOW (ref 3.5–5.0)
Alkaline Phosphatase: 99 U/L (ref 38–126)
Anion gap: 13 (ref 5–15)
BUN: 15 mg/dL (ref 8–23)
CO2: 23 mmol/L (ref 22–32)
Calcium: 9.4 mg/dL (ref 8.9–10.3)
Chloride: 108 mmol/L (ref 98–111)
Creatinine: 0.93 mg/dL (ref 0.44–1.00)
GFR, Estimated: >60 mL/min (ref >60)
Glucose, Bld: 110 mg/dL — ABNORMAL HIGH (ref 70–99)
Potassium: 3.7 mmol/L (ref 3.5–5.1)
Sodium: 144 mmol/L (ref 135–145)
Total Bilirubin: 0.3 mg/dL (ref 0.3–1.2)
Total Protein: 7.1 g/dL (ref 6.5–8.1)

## 2021-05-24 LAB — CBC WITH DIFFERENTIAL (CANCER CENTER ONLY)
Abs Immature Granulocytes: 0.03 10*3/uL (ref 0.00–0.07)
Basophils Absolute: 0 10*3/uL (ref 0.0–0.1)
Basophils Relative: 1 %
Eosinophils Absolute: 0.1 10*3/uL (ref 0.0–0.5)
Eosinophils Relative: 1 %
HCT: 29.5 % — ABNORMAL LOW (ref 36.0–46.0)
Hemoglobin: 9.6 g/dL — ABNORMAL LOW (ref 12.0–15.0)
Immature Granulocytes: 1 %
Lymphocytes Relative: 32 %
Lymphs Abs: 1.8 10*3/uL (ref 0.7–4.0)
MCH: 30.3 pg (ref 26.0–34.0)
MCHC: 32.5 g/dL (ref 30.0–36.0)
MCV: 93.1 fL (ref 80.0–100.0)
Monocytes Absolute: 0.5 10*3/uL (ref 0.1–1.0)
Monocytes Relative: 9 %
Neutro Abs: 3.3 10*3/uL (ref 1.7–7.7)
Neutrophils Relative %: 56 %
Platelet Count: 144 10*3/uL — ABNORMAL LOW (ref 150–400)
RBC: 3.17 MIL/uL — ABNORMAL LOW (ref 3.87–5.11)
RDW: 16.7 % — ABNORMAL HIGH (ref 11.5–15.5)
WBC Count: 5.7 10*3/uL (ref 4.0–10.5)
nRBC: 0 % (ref 0.0–0.2)

## 2021-05-24 MED ORDER — DIPHENHYDRAMINE HCL 50 MG/ML IJ SOLN
25.0000 mg | Freq: Once | INTRAMUSCULAR | Status: AC
  Administered 2021-05-24: 25 mg via INTRAVENOUS

## 2021-05-24 MED ORDER — FAMOTIDINE 20 MG IN NS 100 ML IVPB
20.0000 mg | Freq: Once | INTRAVENOUS | Status: AC
  Administered 2021-05-24: 20 mg via INTRAVENOUS

## 2021-05-24 MED ORDER — PALONOSETRON HCL INJECTION 0.25 MG/5ML
0.2500 mg | Freq: Once | INTRAVENOUS | Status: AC
  Administered 2021-05-24: 0.25 mg via INTRAVENOUS

## 2021-05-24 MED ORDER — SODIUM CHLORIDE 0.9 % IV SOLN
150.0000 mg | Freq: Once | INTRAVENOUS | Status: AC
  Administered 2021-05-24: 150 mg via INTRAVENOUS
  Filled 2021-05-24: qty 150

## 2021-05-24 MED ORDER — HEPARIN SOD (PORK) LOCK FLUSH 100 UNIT/ML IV SOLN
500.0000 [IU] | Freq: Once | INTRAVENOUS | Status: AC | PRN
  Administered 2021-05-24: 500 [IU]
  Filled 2021-05-24: qty 5

## 2021-05-24 MED ORDER — SODIUM CHLORIDE 0.9% FLUSH
10.0000 mL | Freq: Once | INTRAVENOUS | Status: AC
  Administered 2021-05-24: 10 mL
  Filled 2021-05-24: qty 10

## 2021-05-24 MED ORDER — DEXAMETHASONE SODIUM PHOSPHATE 10 MG/ML IJ SOLN
5.0000 mg | Freq: Once | INTRAMUSCULAR | Status: AC
  Administered 2021-05-24: 5 mg via INTRAVENOUS

## 2021-05-24 MED ORDER — FAMOTIDINE 20 MG IN NS 100 ML IVPB
INTRAVENOUS | Status: AC
  Filled 2021-05-24: qty 100

## 2021-05-24 MED ORDER — SODIUM CHLORIDE 0.9 % IV SOLN
140.0000 mg/m2 | Freq: Once | INTRAVENOUS | Status: AC
  Administered 2021-05-24: 288 mg via INTRAVENOUS
  Filled 2021-05-24: qty 48

## 2021-05-24 MED ORDER — CARBOPLATIN CHEMO INJECTION 600 MG/60ML
500.0000 mg | Freq: Once | INTRAVENOUS | Status: AC
  Administered 2021-05-24: 500 mg via INTRAVENOUS
  Filled 2021-05-24: qty 50

## 2021-05-24 MED ORDER — PALONOSETRON HCL INJECTION 0.25 MG/5ML
INTRAVENOUS | Status: AC
  Filled 2021-05-24: qty 5

## 2021-05-24 MED ORDER — SODIUM CHLORIDE 0.9 % IV SOLN
Freq: Once | INTRAVENOUS | Status: AC
Start: 2021-05-24 — End: 2021-05-24
  Filled 2021-05-24: qty 250

## 2021-05-24 MED ORDER — DIPHENHYDRAMINE HCL 50 MG/ML IJ SOLN
INTRAMUSCULAR | Status: AC
  Filled 2021-05-24: qty 1

## 2021-05-24 MED ORDER — DEXAMETHASONE SODIUM PHOSPHATE 10 MG/ML IJ SOLN
INTRAMUSCULAR | Status: AC
  Filled 2021-05-24: qty 1

## 2021-05-24 MED ORDER — SODIUM CHLORIDE 0.9% FLUSH
10.0000 mL | INTRAVENOUS | Status: DC | PRN
  Administered 2021-05-24: 10 mL
  Filled 2021-05-24: qty 10

## 2021-05-24 NOTE — Patient Instructions (Signed)
[facility]   Discharge Instructions: Thank you for choosing [facility] to provide your oncology and hematology care.   If you have a lab appointment with the Cancer Center, please go directly to the Cancer Center and check in at the registration area.   Wear comfortable clothing and clothing appropriate for easy access to any Portacath or PICC line.   We strive to give you quality time with your provider. You may need to reschedule your appointment if you arrive late (15 or more minutes).  Arriving late affects you and other patients whose appointments are after yours.  Also, if you miss three or more appointments without notifying the office, you may be dismissed from the clinic at the provider's discretion.      For prescription refill requests, have your pharmacy contact our office and allow 72 hours for refills to be completed.    Today you received the following chemotherapy and/or immunotherapy agents: paclitaxel and carboplatin.      To help prevent nausea and vomiting after your treatment, we encourage you to take your nausea medication as directed.  BELOW ARE SYMPTOMS THAT SHOULD BE REPORTED IMMEDIATELY: *FEVER GREATER THAN 100.4 F (38 C) OR HIGHER *CHILLS OR SWEATING *NAUSEA AND VOMITING THAT IS NOT CONTROLLED WITH YOUR NAUSEA MEDICATION *UNUSUAL SHORTNESS OF BREATH *UNUSUAL BRUISING OR BLEEDING *URINARY PROBLEMS (pain or burning when urinating, or frequent urination) *BOWEL PROBLEMS (unusual diarrhea, constipation, pain near the anus) TENDERNESS IN MOUTH AND THROAT WITH OR WITHOUT PRESENCE OF ULCERS (sore throat, sores in mouth, or a toothache) UNUSUAL RASH, SWELLING OR PAIN  UNUSUAL VAGINAL DISCHARGE OR ITCHING   Items with * indicate a potential emergency and should be followed up as soon as possible or go to the Emergency Department if any problems should occur.  Please show the CHEMOTHERAPY ALERT CARD or IMMUNOTHERAPY ALERT  CARD at check-in to the Emergency Department and triage nurse.  Should you have questions after your visit or need to cancel or reschedule your appointment, please contact [facility]  Dept: [phone]  and follow the prompts.  Office hours are 8:00 a.m. to 4:30 p.m. Monday - Friday. Please note that voicemails left after 4:00 p.m. may not be returned until the following business day.  We are closed weekends and major holidays. You have access to a nurse at all times for urgent questions. Please call the main number to the clinic Dept: [phone] and follow the prompts.   For any non-urgent questions, you may also contact your provider using MyChart. We now offer e-Visits for anyone 18 and older to request care online for non-urgent symptoms. For details visit [email or web address].   Also download the MyChart app! Go to the app store, search "MyChart", open the app, select [facility], and log in with your MyChart username and password.  Due to Covid, a mask is required upon entering the hospital/clinic. If you do not have a mask, one will be given to you upon arrival. For doctor visits, patients may have 1 support person aged 18 or older with them. For treatment visits, patients cannot have anyone with them due to current Covid guidelines and our immunocompromised population.   

## 2021-05-24 NOTE — Progress Notes (Signed)
Spoke with Dr. Alvy Bimler about Carboplatin dose. She would like to continue 500 mg dose for now.   Larene Beach, PharmD

## 2021-05-24 NOTE — Assessment & Plan Note (Signed)
Her recent CT imaging studies showed no residual signs of disease Previously noted lymphadenopathy and no longer detectable She has small little fluid collection in the left pelvic region, likely postop in nature I plan to repeat another CT scan at the conclusion of her treatment We will continue chemotherapy as scheduled She will start radiation after completion of chemotherapy Due to fluctuation of her weight and creatinine, I plan to keep using lower dose of carboplatin for this cycle as before

## 2021-05-24 NOTE — Assessment & Plan Note (Signed)
Her blood pressure is quite elevated but could be due to anxiety Observe for now

## 2021-05-24 NOTE — Assessment & Plan Note (Signed)
She has pancytopenia from treatment As above, I plan to keep at lower dose of carboplatin as previous cycle

## 2021-05-24 NOTE — Progress Notes (Signed)
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[facility]) Her recent CT imaging studies showed no residual signs of disease Previously noted lymphadenopathy and no longer detectable She has small little fluid collection in the left pelvic region, likely postop in nature I plan to repeat another CT scan at the conclusion of her treatment We will continue chemotherapy as scheduled She will start radiation after completion of chemotherapy Due to fluctuation of her weight and creatinine, I plan to keep using lower dose of carboplatin for this cycle as before  Pancytopenia, acquired ([place]) She has pancytopenia from treatment As above, I plan to keep at lower dose of carboplatin as previous cycle   CKD (chronic kidney disease), stage III ([place]) She has intermittent fluctuation of her kidney function Observe closely  Essential hypertension Her blood pressure is quite elevated but could be due to anxiety Observe for now  No orders of the defined types were placed in this encounter.   All questions were answered. The patient knows to call the clinic with any problems, questions or concerns. The total time spent in the appointment was 20 minutes encounter with patients including review of chart and various tests results, discussions about plan of care and coordination of care plan   Heath Lark, MD 05/24/2021 12:40 PM  INTERVAL HISTORY: Please see below for problem oriented charting. She returns for cycle 5 of treatment She tolerated last cycle well Denies significant nausea or changes in bowel habits No peripheral neuropathy  SUMMARY OF ONCOLOGIC HISTORY: Oncology History Overview Note  Pap 12/14/20: adenocarcinoma, HPV negative  HER-2 positive by FISH Her-2 equivocal by [facility]  MSI stable     Papillary serous adenocarcinoma of endometrium ([place])   Initial Diagnosis   Endometrial carcinoma ([place])   01/12/2021 Initial Biopsy   A. ENDOCERVIX, CURETTAGE:  - Adenocarcinoma.  B. ENDOMETRIUM, BIOPSY:  - Adenocarcinoma.  COMMENT:  The differential diagnosis includes high grade serous carcinoma.  Both  specimens have a similar morphology; high grade serous carcinoma more  commonly arises in the endometrium.  Results reported to Dr. Hale Bogus  on 01/15/2021.  Dr. Saralyn Pilar reviewed the case.    01/16/2021 Tumor Marker   Patient's tumor was tested for the following markers: CA-125 Results of the tumor marker test revealed normal value, 10.7   01/25/2021 Imaging   CT C/A/P: 1. Small volume fluid in the endometrial canal with 7 mm hypoattenuating lesion in the myometrium of the anterior fundus. 2. No definite evidence for metastatic disease in the chest, abdomen, or pelvis. Small lymph nodes are noted along both pelvic sidewalls. Attention on follow-up recommended. 3. 5 mm ground-glass opacity peripheral left upper lobe. This is likely related to infection/inflammation and potentially scar. Attention on surveillance imaging recommended. 4. 5.8 cm lesion posterior lower uterine segment likely a fibroid. Ultrasound exam from 01/28/2010 demonstrated a 5.2 cm lesion in the left aspect of the posterior lower uterine body. 5. Aortic Atherosclerosis (ICD10-I70.0).   01/29/2021 Surgery   TRH/BSO, SLN biopsy left, selective pelvic LND right, omentectomy  Findings: On EUA, 10-12cm enlarged mobile uterus. On intra-abdomina entry, minimal adhesions between the liver and anterior abdomen on the right. Some changes c/w fatty liver on the left. Omentum, stomach, small and large bowel all grossly normal. Bilateral ovaries normal appearing. Uterus with 5-6cm posterior fibroid, otherwise normal appearing.  No mapping to right pelvis. On left, mapping to the level of the superior vessel artery, enlarged  lymph node noted (likely sentinel) within the upper aspect of the obturator space. In bilateral pelvic basins, multiple enlarged lymph nodes. On the right, enlarged lymph nodes extended superiorly along the common iliac vessels. At the end of surgery, no obvious abdominal or pelvic evidence of disease.   01/29/2021 Pathology Results   A. SENTINEL LYMPH NODE, LEFT INTERNAL ILIAC, BIOPSY:  - Metastatic carcinoma in (1) of (1) lymph node.   B. SENTINEL LYMPH NODE, LEFT OBTURATOR, BIOPSY:  - Metastatic carcinoma in (1) of (1) lymph node.   C. LYMPH NODES, LEFT PELVIC, DISSECTION:  - Metastatic carcinoma in (1) of (3) lymph nodes.   D. LYMPH NODE, RIGHT EXTERNAL AND COMMON ILIAC, BIOPSY:  - Metastatic carcinoma in (1) of (1) lymph node.   E. UTERUS, CERVIX AND BILATERAL FALLOPIAN TUBES AND OVARIES, TOTAL  HYSTERECTOMY AND BILATERAL SALPINGO-OOPHORECTOMY:  - High grade serous carcinoma of endometrium, with invasion more than  half of the myometrium.  - Tumor invades the stromal connective tissue of the cervix.  - No involvement of uterine serosa or adnexa.  - Lymphovascular invasion is identified.  - See oncology table.   F. OMENTUM, OMENTECTOMY:  - Omentum, negative for carcinoma.   G. LYMPH NODES, RIGHT PELVIC, DISSECTION:  - Two lymph nodes, negative for carcinoma (0/2).   ONCOLOGY TABLE:   UTERUS, CARCINOMA OR CARCINOSARCOMA: Resection   Procedure: Total hysterectomy and bilateral salpingo-oophorectomy,  Omentectomy, Lymph node sampling  Histologic Type: Serous carcinoma  Histologic Grade: High grade  Myometrial Invasion: > 50%  Uterine Serosa Involvement: Not identified  Cervical Stroma Involvement: Present  Other Tissue/Organ Involvement: Not identified  Peritoneal/Ascitic Fluid: Negative for carcinoma  Lymphovascular Invasion: Present  Regional Lymph Nodes:       Pelvic Lymph Nodes Examined:            2 Sentinel            6 Non-Sentinel            8 Total        Pelvic Lymph Nodes with Metastasis: 4            Macrometastasis: 3            Micrometastasis: 1            Isolated Tumor Cells: 0            Laterality of Lymph Nodes with Tumor: Right (non-sentinel),  Left (sentinel and non-sentinel)            Extracapsular Extension: Present       Para-Aortic Lymph Nodes Examined: Not applicable        Para-Aortic Lymph Nodes with Metastasis: Not applicable  Distant Metastasis:       Distant Site(s) Involved: Omentum: Not involved  Pathologic Stage Classification (pTNM, AJCC 8th Edition): pT2, pN1a  Ancillary Studies: HER2 will be ordered  Additional Findings: Leiomyomata.  Adenomyosis.  Representative Tumor Block: B1  Comment: Dr. Saralyn Pilar reviewed select slides.  (v4.2.0.1)    01/29/2021 Cancer Staging   Staging form: Corpus Uteri - Carcinoma and Carcinosarcoma, AJCC 8th Edition - Clinical stage from 01/29/2021: FIGO Stage IIIC1 (cT1b, cN1a, cM0) - Signed by Lafonda Mosses, MD on 02/02/2021  Histopathologic type: Mixed cell adenocarcinoma  Stage prefix: Initial diagnosis  Method of lymph node assessment: Other  Histologic grade (G): G3  Histologic grading system: 3 grade system  Lymph-vascular invasion (LVI): LVI present/identified, NOS  Peritoneal cytology results: Negative  Pelvic nodal status: Positive  Number of pelvic nodes positive from dissection: 4  Number of pelvic nodes examined during dissection: 8  Para-aortic status: Not assessed  Lymph node metastasis: Present  Omentectomy performed: Yes  Morcellation performed: No    02/19/2021 Echocardiogram    1. Left ventricular ejection fraction, by estimation, is 55 to 60%. The left ventricle has normal function. The left ventricle demonstrates regional wall motion abnormalities (see scoring diagram/findings for description). There is mild left ventricular  hypertrophy. Left ventricular diastolic parameters are consistent with Grade I diastolic dysfunction (impaired  relaxation). There is moderate hypokinesis of the left ventricular, basal septal wall and inferior wall. The average left ventricular global longitudinal strain is -17.9 %. The global longitudinal strain is abnormal.  2. Right ventricular systolic function is normal. The right ventricular size is normal. There is normal pulmonary artery systolic pressure. The estimated right ventricular systolic pressure is 47.8 mmHg.  3. The mitral valve is abnormal. Trivial mitral valve regurgitation.  4. The aortic valve is tricuspid. Aortic valve regurgitation is not visualized.  5. The inferior vena cava is normal in size with greater than 50% respiratory variability, suggesting right atrial pressure of 3 mmHg.   02/20/2021 Procedure   Successful placement of a right IJ approach Power Port with ultrasound and fluoroscopic guidance. The catheter is ready for use.   02/28/2021 -  Chemotherapy    Patient is on Treatment Plan: UTERINE CARBOPLATIN AUC 6 / PACLITAXEL Q21D       04/20/2021 Imaging   Status post hysterectomy and suspected bilateral salpingo-oophorectomy.   2.2 cm fluid density lesion along the left pelvic sidewall likely reflects a postoperative seroma.   No findings suspicious for recurrent or metastatic disease.     REVIEW OF SYSTEMS:   Constitutional: Denies fevers, chills or abnormal weight loss Eyes: Denies blurriness of vision Ears, nose, mouth, throat, and face: Denies mucositis or sore throat Respiratory: Denies cough, dyspnea or wheezes Cardiovascular: Denies palpitation, chest discomfort or lower extremity swelling Gastrointestinal:  Denies nausea, heartburn or change in bowel habits Skin: Denies abnormal skin rashes Lymphatics: Denies new lymphadenopathy or easy bruising Neurological:Denies numbness, tingling or new weaknesses Behavioral/Psych: Mood is stable, no new changes  All other systems were reviewed with the patient and are negative.  I have reviewed the past medical  history, past surgical history, social history and family history with the patient and they are unchanged from previous note.  ALLERGIES:  has No Known Allergies.  MEDICATIONS:  Current Outpatient Medications  Medication Sig Dispense Refill   acetaminophen (TYLENOL) 500 MG tablet Take 1 tablet (500 mg total) by mouth every 6 (six) hours as needed. 30 tablet 0   aspirin EC 81 MG EC tablet Take 1 tablet (81 mg total) by mouth daily.     atorvastatin (LIPITOR) 80 MG tablet TAKE 1 TABLET (80 MG TOTAL) BY MOUTH AT BEDTIME. 30 tablet 6   bismuth subsalicylate (PEPTO BISMOL) 262 MG/15ML suspension Take 30 mLs by mouth every 6 (six) hours as needed for indigestion or diarrhea or loose stools.     clopidogrel (PLAVIX) 75 MG tablet Take 1 tablet by mouth daily with breakfast 30 tablet 6   dexamethasone (DECADRON) 4 MG tablet TAKE 2 TABLETS BY MOUTH THE NIGHT BEFORE CHEMOTHERAPY, EVERY 3 WEEKS, X 6 CYCLES 12 tablet 6   doxazosin (CARDURA) 2 MG tablet TAKE 1 TABLET BY MOUTH DAILY. 30 tablet 1  Dulaglutide (TRULICITY) 1.5 VO/1.6WV SOPN Inject 1.5 mg into the skin once a week. (Patient taking differently: Inject 1.5 mg into the skin every Friday.) 4 pen 3   furosemide (LASIX) 20 MG tablet Take 2 tablets (40 mg total) by mouth daily. Please make PCP visit. 60 tablet 0   glimepiride (AMARYL) 4 MG tablet TAKE 2 TABLETS (8 MG TOTAL) BY MOUTH DAILY WITH BREAKFAST. 60 tablet 6   Insulin Pen Needle (TRUEPLUS PEN NEEDLES) 32G X 4 MM MISC Use as directed to inject victoza once daily (Patient not taking: Reported on 02/26/2021) 100 each 3   isosorbide mononitrate (IMDUR) 30 MG 24 hr tablet Take 2 tablets (60 mg total) by mouth daily. Please make PCP appointment. 60 tablet 0   lidocaine-prilocaine (EMLA) cream APPLY TO AFFECTED AREA ONCE AS DIRECTED 30 g 3   lisinopril (ZESTRIL) 20 MG tablet TAKE 1 TABLET BY MOUTH ONCE A DAY 30 tablet 9   metFORMIN (GLUCOPHAGE) 500 MG tablet Take 2 tablets (1,000 mg total) by mouth 2  (two) times daily with a meal. Please make PCP appt. 120 tablet 0   metoprolol tartrate (LOPRESSOR) 100 MG tablet TAKE 1 TABLET (100 MG TOTAL) BY MOUTH 2 (TWO) TIMES DAILY. 60 tablet 6   nitroGLYCERIN (NITROSTAT) 0.4 MG SL tablet Place 1 tablet (0.4 mg total) under the tongue every 5 (five) minutes as needed for chest pain. (Patient not taking: Reported on 02/26/2021) 25 tablet 2   ondansetron (ZOFRAN) 8 MG tablet TAKE 1 TABLET BY MOUTH EVERY 8 HOURS AS NEEDED. (Patient not taking: Reported on 02/26/2021) 30 tablet 1   pantoprazole (PROTONIX) 40 MG tablet TAKE 1 TABLET (40 MG TOTAL) BY MOUTH DAILY. (Patient not taking: Reported on 02/26/2021) 30 tablet 6   prochlorperazine (COMPAZINE) 10 MG tablet TAKE 1 TABLET BY MOUTH EVERY 6 HOURS AS NEEDED (NAUSEA OR VOMITING). (Patient not taking: Reported on 02/26/2021) 30 tablet 1   senna-docusate (SENOKOT-S) 8.6-50 MG tablet TAKE 2 TABLETS BY MOUTH AT BEDTIME. FOR AFTER SURGERY, DO NOT TAKE IF HAVING DIARRHEA 30 tablet 0   traMADol (ULTRAM) 50 MG tablet Take 1 tablet (50 mg total) by mouth every 6 (six) hours as needed. 30 tablet 0   No current facility-administered medications for this visit.   Facility-Administered Medications Ordered in Other Visits  Medication Dose Route Frequency Provider Last Rate Last Admin   CARBOplatin (PARAPLATIN) 500 mg in sodium chloride 0.9 % 250 mL chemo infusion  500 mg Intravenous Once Alvy Bimler, Jenai Scaletta, MD       heparin lock flush 100 unit/mL  500 Units Intracatheter Once PRN Alvy Bimler, Lukus Binion, MD       PACLitaxel (TAXOL) 288 mg in sodium chloride 0.9 % 250 mL chemo infusion (> 33m/m2)  140 mg/m2 (Treatment Plan Recorded) Intravenous Once GAlvy Bimler Gumaro Brightbill, MD 99 mL/hr at 05/24/21 1220 288 mg at 05/24/21 1220   sodium chloride flush (NS) 0.9 % injection 10 mL  10 mL Intracatheter PRN GHeath Lark MD        PHYSICAL EXAMINATION: ECOG PERFORMANCE STATUS: 1 - Symptomatic but completely ambulatory  Vitals:   05/24/21 0927  BP: (!) 167/74   Pulse: 68  Resp: 18  Temp: (!) 97 F (36.1 C)  SpO2: 100%   Filed Weights   05/24/21 0927  Weight: 209 lb 9.6 oz (95.1 kg)    GENERAL:alert, no distress and comfortable SKIN: skin color, texture, turgor are normal, no rashes or significant lesions EYES: normal, Conjunctiva are pink and non-injected, sclera clear  OROPHARYNX:no exudate, no erythema and lips, buccal mucosa, and tongue normal  NECK: supple, thyroid normal size, non-tender, without nodularity LYMPH:  no palpable lymphadenopathy in the cervical, axillary or inguinal LUNGS: clear to auscultation and percussion with normal breathing effort HEART: regular rate & rhythm and no murmurs and no lower extremity edema ABDOMEN:abdomen soft, non-tender and normal bowel sounds Musculoskeletal:no cyanosis of digits and no clubbing  NEURO: alert & oriented x 3 with fluent speech, no focal motor/sensory deficits  LABORATORY DATA:  I have reviewed the data as listed    Component Value Date/Time   NA 144 05/24/2021 0909   NA 143 10/18/2020 1028   K 3.7 05/24/2021 0909   CL 108 05/24/2021 0909   CO2 23 05/24/2021 0909   GLUCOSE 110 (H) 05/24/2021 0909   BUN 15 05/24/2021 0909   BUN 13 10/18/2020 1028   CREATININE 0.93 05/24/2021 0909   CREATININE 0.79 02/11/2017 0943   CALCIUM 9.4 05/24/2021 0909   PROT 7.1 05/24/2021 0909   PROT 6.7 10/18/2020 1028   ALBUMIN 3.4 (L) 05/24/2021 0909   ALBUMIN 4.1 10/18/2020 1028   AST 17 05/24/2021 0909   ALT 16 05/24/2021 0909   ALKPHOS 99 05/24/2021 0909   BILITOT 0.3 05/24/2021 0909   GFRNONAA >60 05/24/2021 0909   GFRNONAA 83 02/11/2017 0943   GFRAA 70 10/18/2020 1028   GFRAA >89 02/11/2017 0943    No results found for: SPEP, UPEP  Lab Results  Component Value Date   WBC 5.7 05/24/2021   NEUTROABS 3.3 05/24/2021   HGB 9.6 (L) 05/24/2021   HCT 29.5 (L) 05/24/2021   MCV 93.1 05/24/2021   PLT 144 (L) 05/24/2021      Chemistry      Component Value Date/Time   NA 144  05/24/2021 0909   NA 143 10/18/2020 1028   K 3.7 05/24/2021 0909   CL 108 05/24/2021 0909   CO2 23 05/24/2021 0909   BUN 15 05/24/2021 0909   BUN 13 10/18/2020 1028   CREATININE 0.93 05/24/2021 0909   CREATININE 0.79 02/11/2017 0943      Component Value Date/Time   CALCIUM 9.4 05/24/2021 0909   ALKPHOS 99 05/24/2021 0909   AST 17 05/24/2021 0909   ALT 16 05/24/2021 0909   BILITOT 0.3 05/24/2021 0909

## 2021-05-24 NOTE — Assessment & Plan Note (Signed)
She has intermittent fluctuation of her kidney function Observe closely

## 2021-05-31 ENCOUNTER — Encounter: Payer: Self-pay | Admitting: Hematology and Oncology

## 2021-05-31 ENCOUNTER — Other Ambulatory Visit (HOSPITAL_COMMUNITY): Payer: Self-pay

## 2021-05-31 ENCOUNTER — Other Ambulatory Visit: Payer: Self-pay | Admitting: Family Medicine

## 2021-05-31 ENCOUNTER — Other Ambulatory Visit: Payer: Self-pay

## 2021-05-31 ENCOUNTER — Other Ambulatory Visit: Payer: Self-pay | Admitting: Hematology and Oncology

## 2021-05-31 DIAGNOSIS — E1159 Type 2 diabetes mellitus with other circulatory complications: Secondary | ICD-10-CM

## 2021-05-31 DIAGNOSIS — I5032 Chronic diastolic (congestive) heart failure: Secondary | ICD-10-CM

## 2021-05-31 MED ORDER — METFORMIN HCL 500 MG PO TABS
1000.0000 mg | ORAL_TABLET | Freq: Two times a day (BID) | ORAL | 0 refills | Status: DC
  Filled 2021-05-31: qty 120, 30d supply, fill #0

## 2021-05-31 MED ORDER — ISOSORBIDE MONONITRATE ER 30 MG PO TB24
60.0000 mg | ORAL_TABLET | Freq: Every day | ORAL | 0 refills | Status: DC
  Filled 2021-05-31: qty 60, 30d supply, fill #0

## 2021-05-31 MED ORDER — FUROSEMIDE 20 MG PO TABS
40.0000 mg | ORAL_TABLET | Freq: Every day | ORAL | 0 refills | Status: DC
  Filled 2021-05-31: qty 60, 30d supply, fill #0

## 2021-05-31 MED ORDER — DOXAZOSIN MESYLATE 2 MG PO TABS
ORAL_TABLET | Freq: Every day | ORAL | 1 refills | Status: DC
Start: 2021-05-31 — End: 2021-07-11
  Filled 2021-05-31: qty 30, 30d supply, fill #0

## 2021-05-31 MED FILL — Lisinopril Tab 20 MG: ORAL | 30 days supply | Qty: 30 | Fill #2 | Status: AC

## 2021-05-31 NOTE — Telephone Encounter (Signed)
Notes to clinic:  patient has appt on 06/20/2021 Review for another refill Last refill states that patient must have appt before next refill    Requested Prescriptions  Pending Prescriptions Disp Refills   metFORMIN (GLUCOPHAGE) 500 MG tablet 120 tablet 0    Sig: Take 2 tablets (1,000 mg total) by mouth 2 (two) times daily with a meal. Please make PCP appt.      Endocrinology:  Diabetes - Biguanides Passed - 05/31/2021 10:52 AM      Passed - Cr in normal range and within 360 days    Creatinine  Date Value Ref Range Status  05/24/2021 0.93 0.44 - 1.00 mg/dL Final   Creat  Date Value Ref Range Status  02/11/2017 0.79 0.50 - 1.05 mg/dL Final    Comment:      For patients > or = 63 years of age: The upper reference limit for Creatinine is approximately 13% higher for people identified as African-American.      Creatinine, Urine  Date Value Ref Range Status  06/19/2019 268.44 mg/dL Final    Comment:    Performed at [facility] [address], [place], [place] [address]          Passed - HBA1C is between 0 and 7.9 and within 180 days    HbA1c, POC (controlled diabetic range)  Date Value Ref Range Status  10/18/2020 7.1 (A) 0.0 - 7.0 % Final   Hgb A1c MFr Bld  Date Value Ref Range Status  01/16/2021 7.1 (H) 4.8 - 5.6 % Final    Comment:    (NOTE) Pre diabetes:          5.7%-6.4%  Diabetes:              >6.4%  Glycemic control for   <7.0% adults with diabetes           Passed - AA eGFR in normal range and within 360 days    GFR, Est African American  Date Value Ref Range Status  02/11/2017 >89 >=60 mL/min Final   GFR calc Af Amer  Date Value Ref Range Status  10/18/2020 70 >59 mL/min/1.73 Final    Comment:    **In accordance with recommendations from the NKF-ASN Task force,**   [facility] is in the process of updating its eGFR calculation to the   2021 CKD-EPI creatinine equation that estimates kidney function   without a race variable.    GFR, Est  Non African American  Date Value Ref Range Status  02/11/2017 83 >=60 mL/min Final   GFR, Estimated  Date Value Ref Range Status  05/24/2021 >60 >60 mL/min Final    Comment:    (NOTE) Calculated using the CKD-EPI Creatinine Equation (2021)           Passed - Valid encounter within last 6 months    Recent Outpatient Visits           5 months ago Post-menopausal bleeding   [place], [place], MD   7 months ago Type 2 diabetes mellitus with other circulatory complication, without long-term current use of insulin ([place])   [place], [place], MD   1 year ago Type 2 diabetes mellitus with other circulatory complication, without long-term current use of insulin ([place])   [place], [name], MD   1 year ago Type 2 diabetes mellitus with other circulatory complication, without long-term current use  of insulin [place])   [place] [name], MD   2 years ago Dyslipidemia   [place], [place], MD       Future Appointments             In 2 weeks [place], Dionne Bucy, PA-C [place]   In 3 months Croitoru, [place], MD [facility]               isosorbide mononitrate (IMDUR) 30 MG 24 hr tablet 60 tablet 0    Sig: Take 2 tablets (60 mg total) by mouth daily. Please make PCP appointment.      Cardiovascular:  Nitrates Failed - 05/31/2021 10:52 AM      Failed - Last BP in normal range    BP Readings from Last 1 Encounters:  05/24/21 (!) 167/74          Passed - Last Heart Rate in normal range    Pulse Readings from Last 1 Encounters:  05/24/21 68          Passed - Valid encounter within last 12 months    Recent Outpatient Visits           5 months ago Post-menopausal bleeding   [place], [place], MD    7 months ago Type 2 diabetes mellitus with other circulatory complication, without long-term current use of insulin ([place])   [place], [place], MD   1 year ago Type 2 diabetes mellitus with other circulatory complication, without long-term current use of insulin ([place])   [place], [place], MD   1 year ago Type 2 diabetes mellitus with other circulatory complication, without long-term current use of insulin ([place])   [place], [place], MD   2 years ago Dyslipidemia   [place], [name], MD       Future Appointments             In 2 weeks [place], Dionne Bucy, PA-C [place]   In 3 months Croitoru, [place], MD [facility], [facility]               furosemide (LASIX) 20 MG tablet 60 tablet 0    Sig: Take 2 tablets (40 mg total) by mouth daily. Please make PCP visit.      Cardiovascular:  Diuretics - Loop Failed - 05/31/2021 10:52 AM      Failed - Last BP in normal range    BP Readings from Last 1 Encounters:  05/24/21 (!) 167/74          Passed - K in normal range and within 360 days    Potassium  Date Value Ref Range Status  05/24/2021 3.7 3.5 - 5.1 mmol/L Final          Passed - Ca in normal range and within 360 days    Calcium  Date Value Ref Range Status  05/24/2021 9.4 8.9 - 10.3 mg/dL Final   Calcium, Ion  Date Value Ref Range Status  09/04/2008 1.15  Final          Passed - Na in normal range and within 360 days    Sodium  Date Value Ref Range Status  05/24/2021 144 135 - 145 mmol/L Final  10/18/2020 143 134 -  144 mmol/L Final          Passed - Cr in normal range and within 360 days    Creatinine  Date Value Ref Range Status  05/24/2021 0.93 0.44 - 1.00 mg/dL Final   Creat  Date Value Ref Range Status  02/11/2017 0.79 0.50 - 1.05 mg/dL Final    Comment:       For patients > or = 63 years of age: The upper reference limit for Creatinine is approximately 13% higher for people identified as African-American.      Creatinine, Urine  Date Value Ref Range Status  06/19/2019 268.44 mg/dL Final    Comment:    Performed at [facility] [address], [place], [place] [address]          Passed - Valid encounter within last 6 months    Recent Outpatient Visits           5 months ago Post-menopausal bleeding   [place], [place], MD   7 months ago Type 2 diabetes mellitus with other circulatory complication, without long-term current use of insulin ([place])   [place], [place], MD   1 year ago Type 2 diabetes mellitus with other circulatory complication, without long-term current use of insulin ([place])   [place], [place], MD   1 year ago Type 2 diabetes mellitus with other circulatory complication, without long-term current use of insulin ([place])   [place], [place], MD   2 years ago Dyslipidemia   [place], Enobong, MD       Future Appointments             In 2 weeks [place], Dionne Bucy, PA-C [place]   In 3 months Croitoru, [place], MD [facility], [place]

## 2021-06-01 ENCOUNTER — Other Ambulatory Visit: Payer: Self-pay

## 2021-06-05 ENCOUNTER — Other Ambulatory Visit: Payer: Self-pay

## 2021-06-14 ENCOUNTER — Other Ambulatory Visit: Payer: Self-pay

## 2021-06-14 ENCOUNTER — Inpatient Hospital Stay: Payer: Self-pay

## 2021-06-14 ENCOUNTER — Inpatient Hospital Stay (HOSPITAL_BASED_OUTPATIENT_CLINIC_OR_DEPARTMENT_OTHER): Payer: Self-pay | Admitting: Hematology and Oncology

## 2021-06-14 ENCOUNTER — Other Ambulatory Visit: Payer: Self-pay | Admitting: Hematology and Oncology

## 2021-06-14 ENCOUNTER — Encounter: Payer: Self-pay | Admitting: Hematology and Oncology

## 2021-06-14 DIAGNOSIS — C541 Malignant neoplasm of endometrium: Secondary | ICD-10-CM

## 2021-06-14 DIAGNOSIS — D6481 Anemia due to antineoplastic chemotherapy: Secondary | ICD-10-CM

## 2021-06-14 DIAGNOSIS — G620 Drug-induced polyneuropathy: Secondary | ICD-10-CM

## 2021-06-14 DIAGNOSIS — N183 Chronic kidney disease, stage 3 unspecified: Secondary | ICD-10-CM

## 2021-06-14 DIAGNOSIS — T451X5A Adverse effect of antineoplastic and immunosuppressive drugs, initial encounter: Secondary | ICD-10-CM

## 2021-06-14 LAB — CMP (CANCER CENTER ONLY)
ALT: 16 U/L (ref 0–44)
AST: 18 U/L (ref 15–41)
Albumin: 3.4 g/dL — ABNORMAL LOW (ref 3.5–5.0)
Alkaline Phosphatase: 104 U/L (ref 38–126)
Anion gap: 12 (ref 5–15)
BUN: 16 mg/dL (ref 8–23)
CO2: 22 mmol/L (ref 22–32)
Calcium: 9 mg/dL (ref 8.9–10.3)
Chloride: 108 mmol/L (ref 98–111)
Creatinine: 1.17 mg/dL — ABNORMAL HIGH (ref 0.44–1.00)
GFR, Estimated: 53 mL/min — ABNORMAL LOW (ref >60)
Glucose, Bld: 94 mg/dL (ref 70–99)
Potassium: 3.9 mmol/L (ref 3.5–5.1)
Sodium: 142 mmol/L (ref 135–145)
Total Bilirubin: 0.3 mg/dL (ref 0.3–1.2)
Total Protein: 7 g/dL (ref 6.5–8.1)

## 2021-06-14 LAB — CBC WITH DIFFERENTIAL (CANCER CENTER ONLY)
Abs Immature Granulocytes: 0.01 10*3/uL (ref 0.00–0.07)
Basophils Absolute: 0 10*3/uL (ref 0.0–0.1)
Basophils Relative: 0 %
Eosinophils Absolute: 0 10*3/uL (ref 0.0–0.5)
Eosinophils Relative: 1 %
HCT: 26.9 % — ABNORMAL LOW (ref 36.0–46.0)
Hemoglobin: 9 g/dL — ABNORMAL LOW (ref 12.0–15.0)
Immature Granulocytes: 0 %
Lymphocytes Relative: 42 %
Lymphs Abs: 1.8 10*3/uL (ref 0.7–4.0)
MCH: 32.4 pg (ref 26.0–34.0)
MCHC: 33.5 g/dL (ref 30.0–36.0)
MCV: 96.8 fL (ref 80.0–100.0)
Monocytes Absolute: 0.4 10*3/uL (ref 0.1–1.0)
Monocytes Relative: 11 %
Neutro Abs: 2 10*3/uL (ref 1.7–7.7)
Neutrophils Relative %: 46 %
Platelet Count: 126 10*3/uL — ABNORMAL LOW (ref 150–400)
RBC: 2.78 MIL/uL — ABNORMAL LOW (ref 3.87–5.11)
RDW: 17.2 % — ABNORMAL HIGH (ref 11.5–15.5)
WBC Count: 4.2 10*3/uL (ref 4.0–10.5)
nRBC: 0 % (ref 0.0–0.2)

## 2021-06-14 MED ORDER — DEXAMETHASONE SODIUM PHOSPHATE 10 MG/ML IJ SOLN
5.0000 mg | Freq: Once | INTRAMUSCULAR | Status: AC
  Administered 2021-06-14: 5 mg via INTRAVENOUS

## 2021-06-14 MED ORDER — FAMOTIDINE 20 MG IN NS 100 ML IVPB
20.0000 mg | Freq: Once | INTRAVENOUS | Status: AC
  Administered 2021-06-14: 20 mg via INTRAVENOUS

## 2021-06-14 MED ORDER — SODIUM CHLORIDE 0.9% FLUSH
10.0000 mL | INTRAVENOUS | Status: DC | PRN
  Administered 2021-06-14: 10 mL
  Filled 2021-06-14: qty 10

## 2021-06-14 MED ORDER — FAMOTIDINE 20 MG IN NS 100 ML IVPB
INTRAVENOUS | Status: AC
  Filled 2021-06-14: qty 100

## 2021-06-14 MED ORDER — SODIUM CHLORIDE 0.9 % IV SOLN
490.0000 mg | Freq: Once | INTRAVENOUS | Status: AC
  Administered 2021-06-14: 490 mg via INTRAVENOUS
  Filled 2021-06-14: qty 49

## 2021-06-14 MED ORDER — DEXAMETHASONE SODIUM PHOSPHATE 10 MG/ML IJ SOLN
INTRAMUSCULAR | Status: AC
  Filled 2021-06-14: qty 1

## 2021-06-14 MED ORDER — PALONOSETRON HCL INJECTION 0.25 MG/5ML
0.2500 mg | Freq: Once | INTRAVENOUS | Status: AC
  Administered 2021-06-14: 0.25 mg via INTRAVENOUS

## 2021-06-14 MED ORDER — SODIUM CHLORIDE 0.9 % IV SOLN
105.0000 mg/m2 | Freq: Once | INTRAVENOUS | Status: AC
  Administered 2021-06-14: 210 mg via INTRAVENOUS
  Filled 2021-06-14: qty 35

## 2021-06-14 MED ORDER — HEPARIN SOD (PORK) LOCK FLUSH 100 UNIT/ML IV SOLN
500.0000 [IU] | Freq: Once | INTRAVENOUS | Status: AC | PRN
  Administered 2021-06-14: 500 [IU]
  Filled 2021-06-14: qty 5

## 2021-06-14 MED ORDER — SODIUM CHLORIDE 0.9 % IV SOLN
Freq: Once | INTRAVENOUS | Status: DC
Start: 2021-06-14 — End: 2021-07-11
  Filled 2021-06-14: qty 250

## 2021-06-14 MED ORDER — SODIUM CHLORIDE 0.9 % IV SOLN
150.0000 mg | Freq: Once | INTRAVENOUS | Status: AC
  Administered 2021-06-14: 150 mg via INTRAVENOUS
  Filled 2021-06-14: qty 150

## 2021-06-14 MED ORDER — PALONOSETRON HCL INJECTION 0.25 MG/5ML
INTRAVENOUS | Status: AC
  Filled 2021-06-14: qty 5

## 2021-06-14 MED ORDER — DIPHENHYDRAMINE HCL 50 MG/ML IJ SOLN
25.0000 mg | Freq: Once | INTRAMUSCULAR | Status: AC
  Administered 2021-06-14: 25 mg via INTRAVENOUS

## 2021-06-14 MED ORDER — SODIUM CHLORIDE 0.9 % IV SOLN
Freq: Once | INTRAVENOUS | Status: AC
Start: 2021-06-14 — End: 2021-06-14
  Filled 2021-06-14: qty 250

## 2021-06-14 MED ORDER — DIPHENHYDRAMINE HCL 50 MG/ML IJ SOLN
INTRAMUSCULAR | Status: AC
  Filled 2021-06-14: qty 1

## 2021-06-14 MED ORDER — SODIUM CHLORIDE 0.9% FLUSH
10.0000 mL | Freq: Once | INTRAVENOUS | Status: AC
  Administered 2021-06-14: 10 mL via INTRAVENOUS
  Filled 2021-06-14: qty 10

## 2021-06-14 NOTE — Patient Instructions (Signed)
[facility]   Discharge Instructions: Thank you for choosing [facility] to provide your oncology and hematology care.   If you have a lab appointment with the Cancer Center, please go directly to the Cancer Center and check in at the registration area.   Wear comfortable clothing and clothing appropriate for easy access to any Portacath or PICC line.   We strive to give you quality time with your provider. You may need to reschedule your appointment if you arrive late (15 or more minutes).  Arriving late affects you and other patients whose appointments are after yours.  Also, if you miss three or more appointments without notifying the office, you may be dismissed from the clinic at the provider's discretion.      For prescription refill requests, have your pharmacy contact our office and allow 72 hours for refills to be completed.    Today you received the following chemotherapy and/or immunotherapy agents: paclitaxel and carboplatin.      To help prevent nausea and vomiting after your treatment, we encourage you to take your nausea medication as directed.  BELOW ARE SYMPTOMS THAT SHOULD BE REPORTED IMMEDIATELY: *FEVER GREATER THAN 100.4 F (38 C) OR HIGHER *CHILLS OR SWEATING *NAUSEA AND VOMITING THAT IS NOT CONTROLLED WITH YOUR NAUSEA MEDICATION *UNUSUAL SHORTNESS OF BREATH *UNUSUAL BRUISING OR BLEEDING *URINARY PROBLEMS (pain or burning when urinating, or frequent urination) *BOWEL PROBLEMS (unusual diarrhea, constipation, pain near the anus) TENDERNESS IN MOUTH AND THROAT WITH OR WITHOUT PRESENCE OF ULCERS (sore throat, sores in mouth, or a toothache) UNUSUAL RASH, SWELLING OR PAIN  UNUSUAL VAGINAL DISCHARGE OR ITCHING   Items with * indicate a potential emergency and should be followed up as soon as possible or go to the Emergency Department if any problems should occur.  Please show the CHEMOTHERAPY ALERT CARD or IMMUNOTHERAPY ALERT  CARD at check-in to the Emergency Department and triage nurse.  Should you have questions after your visit or need to cancel or reschedule your appointment, please contact [facility]  Dept: [phone]  and follow the prompts.  Office hours are 8:00 a.m. to 4:30 p.m. Monday - Friday. Please note that voicemails left after 4:00 p.m. may not be returned until the following business day.  We are closed weekends and major holidays. You have access to a nurse at all times for urgent questions. Please call the main number to the clinic Dept: [phone] and follow the prompts.   For any non-urgent questions, you may also contact your provider using MyChart. We now offer e-Visits for anyone 18 and older to request care online for non-urgent symptoms. For details visit [email or web address].   Also download the MyChart app! Go to the app store, search "MyChart", open the app, select [facility], and log in with your MyChart username and password.  Due to Covid, a mask is required upon entering the hospital/clinic. If you do not have a mask, one will be given to you upon arrival. For doctor visits, patients may have 1 support person aged 18 or older with them. For treatment visits, patients cannot have anyone with them due to current Covid guidelines and our immunocompromised population.   

## 2021-06-14 NOTE — Progress Notes (Signed)
[place] OFFICE PROGRESS NOTE  Patient Care Team: [name], MD as PCP - General (Family Medicine) Croitoru, Dani Gobble, MD as PCP - Cardiology (Cardiology)  ASSESSMENT & PLAN:  Papillary serous adenocarcinoma of endometrium [facility]) Her recent CT imaging studies showed no residual signs of disease Previously noted lymphadenopathy and no longer detectable She has small little fluid collection in the left pelvic region, likely postop in nature I do not plan to repeat imaging study until November We will continue chemotherapy as scheduled; I will inform the GYN navigator to have her scheduled She will start radiation after completion of chemotherapy Due to worsening neuropathy, I plan to reduce the dose of Taxol further   Peripheral neuropathy due to chemotherapy [facility]) She does not want to try cryotherapy to reduce risk of peripheral neuropathy I plan to reduce the dose of Taxol  CKD (chronic kidney disease), stage III ([place]) She has intermittent fluctuation of her kidney function I will adjust the dose of carboplatin accordingly Observe closely  Anemia due to antineoplastic chemotherapy This is likely due to recent treatment. The patient denies recent history of bleeding such as epistaxis, hematuria or hematochezia. She is asymptomatic from the anemia. I will observe for now.  She does not require transfusion now. I will continue the chemotherapy at current dose without dosage adjustment  No orders of the defined types were placed in this encounter.   All questions were answered. The patient knows to call the clinic with any problems, questions or concerns. The total time spent in the appointment was 30 minutes encounter with patients including review of chart and various tests results, discussions about plan of care and coordination of care plan   Heath Lark, MD 06/14/2021 1:27 PM  INTERVAL HISTORY: Please see below for problem oriented charting. She returns for  treatment follow-up She has some mild nausea but no vomiting She noticed worsening peripheral neuropathy affecting her fingertips and toes She declines using cryotherapy during treatment Denies recent chest pain or shortness of breath No abdominal pain  SUMMARY OF ONCOLOGIC HISTORY: Oncology History Overview Note  Pap 12/14/20: adenocarcinoma, HPV negative  HER-2 positive by FISH Her-2 equivocal by [facility]  MSI stable    Papillary serous adenocarcinoma of endometrium ([place])   Initial Diagnosis   Endometrial carcinoma ([place])   01/12/2021 Initial Biopsy   A. ENDOCERVIX, CURETTAGE:  - Adenocarcinoma.  B. ENDOMETRIUM, BIOPSY:  - Adenocarcinoma.  COMMENT:  The differential diagnosis includes high grade serous carcinoma.  Both  specimens have a similar morphology; high grade serous carcinoma more  commonly arises in the endometrium.  Results reported to Dr. Hale Bogus  on 01/15/2021.  Dr. Saralyn Pilar reviewed the case.    01/16/2021 Tumor Marker   Patient's tumor was tested for the following markers: CA-125 Results of the tumor marker test revealed normal value, 10.7   01/25/2021 Imaging   CT C/A/P: 1. Small volume fluid in the endometrial canal with 7 mm hypoattenuating lesion in the myometrium of the anterior fundus. 2. No definite evidence for metastatic disease in the chest, abdomen, or pelvis. Small lymph nodes are noted along both pelvic sidewalls. Attention on follow-up recommended. 3. 5 mm ground-glass opacity peripheral left upper lobe. This is likely related to infection/inflammation and potentially scar. Attention on surveillance imaging recommended. 4. 5.8 cm lesion posterior lower uterine segment likely a fibroid. Ultrasound exam from 01/28/2010 demonstrated a 5.2 cm lesion in the left aspect of the posterior lower uterine body. 5. Aortic Atherosclerosis (ICD10-I70.0).  01/29/2021 Surgery   TRH/BSO, SLN biopsy left, selective pelvic LND right, omentectomy  Findings: On  EUA, 10-12cm enlarged mobile uterus. On intra-abdomina entry, minimal adhesions between the liver and anterior abdomen on the right. Some changes c/w fatty liver on the left. Omentum, stomach, small and large bowel all grossly normal. Bilateral ovaries normal appearing. Uterus with 5-6cm posterior fibroid, otherwise normal appearing. No mapping to right pelvis. On left, mapping to the level of the superior vessel artery, enlarged lymph node noted (likely sentinel) within the upper aspect of the obturator space. In bilateral pelvic basins, multiple enlarged lymph nodes. On the right, enlarged lymph nodes extended superiorly along the common iliac vessels. At the end of surgery, no obvious abdominal or pelvic evidence of disease.   01/29/2021 Pathology Results   A. SENTINEL LYMPH NODE, LEFT INTERNAL ILIAC, BIOPSY:  - Metastatic carcinoma in (1) of (1) lymph node.   B. SENTINEL LYMPH NODE, LEFT OBTURATOR, BIOPSY:  - Metastatic carcinoma in (1) of (1) lymph node.   C. LYMPH NODES, LEFT PELVIC, DISSECTION:  - Metastatic carcinoma in (1) of (3) lymph nodes.   D. LYMPH NODE, RIGHT EXTERNAL AND COMMON ILIAC, BIOPSY:  - Metastatic carcinoma in (1) of (1) lymph node.   E. UTERUS, CERVIX AND BILATERAL FALLOPIAN TUBES AND OVARIES, TOTAL  HYSTERECTOMY AND BILATERAL SALPINGO-OOPHORECTOMY:  - High grade serous carcinoma of endometrium, with invasion more than  half of the myometrium.  - Tumor invades the stromal connective tissue of the cervix.  - No involvement of uterine serosa or adnexa.  - Lymphovascular invasion is identified.  - See oncology table.   F. OMENTUM, OMENTECTOMY:  - Omentum, negative for carcinoma.   G. LYMPH NODES, RIGHT PELVIC, DISSECTION:  - Two lymph nodes, negative for carcinoma (0/2).   ONCOLOGY TABLE:   UTERUS, CARCINOMA OR CARCINOSARCOMA: Resection   Procedure: Total hysterectomy and bilateral salpingo-oophorectomy,  Omentectomy, Lymph node sampling  Histologic Type:  Serous carcinoma  Histologic Grade: High grade  Myometrial Invasion: > 50%  Uterine Serosa Involvement: Not identified  Cervical Stroma Involvement: Present  Other Tissue/Organ Involvement: Not identified  Peritoneal/Ascitic Fluid: Negative for carcinoma  Lymphovascular Invasion: Present  Regional Lymph Nodes:       Pelvic Lymph Nodes Examined:            2 Sentinel            6 Non-Sentinel            8 Total       Pelvic Lymph Nodes with Metastasis: 4            Macrometastasis: 3            Micrometastasis: 1            Isolated Tumor Cells: 0            Laterality of Lymph Nodes with Tumor: Right (non-sentinel),  Left (sentinel and non-sentinel)            Extracapsular Extension: Present       Para-Aortic Lymph Nodes Examined: Not applicable        Para-Aortic Lymph Nodes with Metastasis: Not applicable  Distant Metastasis:       Distant Site(s) Involved: Omentum: Not involved  Pathologic Stage Classification (pTNM, AJCC 8th Edition): pT2, pN1a  Ancillary Studies: HER2 will be ordered  Additional Findings: Leiomyomata.  Adenomyosis.  Representative Tumor Block: B1  Comment: Dr. Saralyn Pilar reviewed select slides.  (v4.2.0.1)    01/29/2021 Cancer Staging  Staging form: Corpus Uteri - Carcinoma and Carcinosarcoma, AJCC 8th Edition - Clinical stage from 01/29/2021: FIGO Stage IIIC1 (cT1b, cN1a, cM0) - Signed by Lafonda Mosses, MD on 02/02/2021  Histopathologic type: Mixed cell adenocarcinoma  Stage prefix: Initial diagnosis  Method of lymph node assessment: Other  Histologic grade (G): G3  Histologic grading system: 3 grade system  Lymph-vascular invasion (LVI): LVI present/identified, NOS  Peritoneal cytology results: Negative  Pelvic nodal status: Positive  Number of pelvic nodes positive from dissection: 4  Number of pelvic nodes examined during dissection: 8  Para-aortic status: Not assessed  Lymph node metastasis: Present  Omentectomy performed:  Yes  Morcellation performed: No    02/19/2021 Echocardiogram    1. Left ventricular ejection fraction, by estimation, is 55 to 60%. The left ventricle has normal function. The left ventricle demonstrates regional wall motion abnormalities (see scoring diagram/findings for description). There is mild left ventricular  hypertrophy. Left ventricular diastolic parameters are consistent with Grade I diastolic dysfunction (impaired relaxation). There is moderate hypokinesis of the left ventricular, basal septal wall and inferior wall. The average left ventricular global longitudinal strain is -17.9 %. The global longitudinal strain is abnormal.  2. Right ventricular systolic function is normal. The right ventricular size is normal. There is normal pulmonary artery systolic pressure. The estimated right ventricular systolic pressure is 29.5 mmHg.  3. The mitral valve is abnormal. Trivial mitral valve regurgitation.  4. The aortic valve is tricuspid. Aortic valve regurgitation is not visualized.  5. The inferior vena cava is normal in size with greater than 50% respiratory variability, suggesting right atrial pressure of 3 mmHg.   02/20/2021 Procedure   Successful placement of a right IJ approach Power Port with ultrasound and fluoroscopic guidance. The catheter is ready for use.   02/28/2021 -  Chemotherapy    Patient is on Treatment Plan: UTERINE CARBOPLATIN AUC 6 / PACLITAXEL Q21D       04/20/2021 Imaging   Status post hysterectomy and suspected bilateral salpingo-oophorectomy.   2.2 cm fluid density lesion along the left pelvic sidewall likely reflects a postoperative seroma.   No findings suspicious for recurrent or metastatic disease.     REVIEW OF SYSTEMS:   Constitutional: Denies fevers, chills or abnormal weight loss Eyes: Denies blurriness of vision Ears, nose, mouth, throat, and face: Denies mucositis or sore throat Respiratory: Denies cough, dyspnea or wheezes Cardiovascular: Denies  palpitation, chest discomfort or lower extremity swelling Gastrointestinal:  Denies nausea, heartburn or change in bowel habits Skin: Denies abnormal skin rashes Lymphatics: Denies new lymphadenopathy or easy bruising Behavioral/Psych: Mood is stable, no new changes  All other systems were reviewed with the patient and are negative.  I have reviewed the past medical history, past surgical history, social history and family history with the patient and they are unchanged from previous note.  ALLERGIES:  has No Known Allergies.  MEDICATIONS:  Current Outpatient Medications  Medication Sig Dispense Refill   acetaminophen (TYLENOL) 500 MG tablet Take 1 tablet (500 mg total) by mouth every 6 (six) hours as needed. 30 tablet 0   aspirin EC 81 MG EC tablet Take 1 tablet (81 mg total) by mouth daily.     atorvastatin (LIPITOR) 80 MG tablet TAKE 1 TABLET (80 MG TOTAL) BY MOUTH AT BEDTIME. 30 tablet 6   bismuth subsalicylate (PEPTO BISMOL) 262 MG/15ML suspension Take 30 mLs by mouth every 6 (six) hours as needed for indigestion or diarrhea or loose stools.     clopidogrel (  PLAVIX) 75 MG tablet Take 1 tablet by mouth daily with breakfast 30 tablet 6   dexamethasone (DECADRON) 4 MG tablet TAKE 2 TABLETS BY MOUTH THE NIGHT BEFORE CHEMOTHERAPY, EVERY 3 WEEKS, X 6 CYCLES 12 tablet 6   doxazosin (CARDURA) 2 MG tablet TAKE 1 TABLET BY MOUTH DAILY. 30 tablet 1   Dulaglutide (TRULICITY) 1.5 VZ/5.6LO SOPN Inject 1.5 mg into the skin once a week. (Patient taking differently: Inject 1.5 mg into the skin every Friday.) 4 pen 3   furosemide (LASIX) 20 MG tablet Take 2 tablets (40 mg total) by mouth daily. Please make PCP visit. 60 tablet 0   glimepiride (AMARYL) 4 MG tablet TAKE 2 TABLETS (8 MG TOTAL) BY MOUTH DAILY WITH BREAKFAST. 60 tablet 6   Insulin Pen Needle (TRUEPLUS PEN NEEDLES) 32G X 4 MM MISC Use as directed to inject victoza once daily (Patient not taking: Reported on 02/26/2021) 100 each 3   isosorbide  mononitrate (IMDUR) 30 MG 24 hr tablet Take 2 tablets (60 mg total) by mouth daily. Please make PCP appointment. 60 tablet 0   lidocaine-prilocaine (EMLA) cream APPLY TO AFFECTED AREA ONCE AS DIRECTED 30 g 3   lisinopril (ZESTRIL) 20 MG tablet TAKE 1 TABLET BY MOUTH ONCE A DAY 30 tablet 9   metFORMIN (GLUCOPHAGE) 500 MG tablet Take 2 tablets (1,000 mg total) by mouth 2 (two) times daily with a meal. Please make PCP appt. 120 tablet 0   metoprolol tartrate (LOPRESSOR) 100 MG tablet TAKE 1 TABLET (100 MG TOTAL) BY MOUTH 2 (TWO) TIMES DAILY. 60 tablet 6   nitroGLYCERIN (NITROSTAT) 0.4 MG SL tablet Place 1 tablet (0.4 mg total) under the tongue every 5 (five) minutes as needed for chest pain. (Patient not taking: Reported on 02/26/2021) 25 tablet 2   ondansetron (ZOFRAN) 8 MG tablet TAKE 1 TABLET BY MOUTH EVERY 8 HOURS AS NEEDED. (Patient not taking: Reported on 02/26/2021) 30 tablet 1   pantoprazole (PROTONIX) 40 MG tablet TAKE 1 TABLET (40 MG TOTAL) BY MOUTH DAILY. (Patient not taking: Reported on 02/26/2021) 30 tablet 6   prochlorperazine (COMPAZINE) 10 MG tablet TAKE 1 TABLET BY MOUTH EVERY 6 HOURS AS NEEDED (NAUSEA OR VOMITING). (Patient not taking: Reported on 02/26/2021) 30 tablet 1   senna-docusate (SENOKOT-S) 8.6-50 MG tablet TAKE 2 TABLETS BY MOUTH AT BEDTIME. FOR AFTER SURGERY, DO NOT TAKE IF HAVING DIARRHEA 30 tablet 0   traMADol (ULTRAM) 50 MG tablet Take 1 tablet (50 mg total) by mouth every 6 (six) hours as needed. 30 tablet 0   No current facility-administered medications for this visit.   Facility-Administered Medications Ordered in Other Visits  Medication Dose Route Frequency Provider Last Rate Last Admin   0.9 %  sodium chloride infusion   Intravenous Once Newlin, Enobong, MD       CARBOplatin (PARAPLATIN) 490 mg in sodium chloride 0.9 % 250 mL chemo infusion  490 mg Intravenous Once Alvy Bimler, Louann Hopson, MD       heparin lock flush 100 unit/mL  500 Units Intracatheter Once PRN Alvy Bimler, Aubert Choyce, MD        PACLitaxel (TAXOL) 210 mg in sodium chloride 0.9 % 250 mL chemo infusion (> 32m/m2)  105 mg/m2 (Treatment Plan Recorded) Intravenous Once GAlvy Bimler Reality Dejonge, MD 95 mL/hr at 06/14/21 1313 210 mg at 06/14/21 1313   sodium chloride flush (NS) 0.9 % injection 10 mL  10 mL Intracatheter PRN GHeath Lark MD        PHYSICAL EXAMINATION: ECOG PERFORMANCE STATUS:  1 - Symptomatic but completely ambulatory  Vitals:   06/14/21 1040  BP: (!) 118/56  Pulse: 77  Resp: 17  Temp: (!) 97.2 F (36.2 C)  SpO2: 100%   Filed Weights   06/14/21 1040  Weight: 204 lb (92.5 kg)    GENERAL:alert, no distress and comfortable SKIN: skin color, texture, turgor are normal, no rashes or significant lesions EYES: normal, Conjunctiva are pink and non-injected, sclera clear OROPHARYNX:no exudate, no erythema and lips, buccal mucosa, and tongue normal  NECK: supple, thyroid normal size, non-tender, without nodularity LYMPH:  no palpable lymphadenopathy in the cervical, axillary or inguinal LUNGS: clear to auscultation and percussion with normal breathing effort HEART: regular rate & rhythm and no murmurs and no lower extremity edema ABDOMEN:abdomen soft, non-tender and normal bowel sounds Musculoskeletal:no cyanosis of digits and no clubbing  NEURO: alert & oriented x 3 with fluent speech, no focal motor/sensory deficits  LABORATORY DATA:  I have reviewed the data as listed    Component Value Date/Time   NA 142 06/14/2021 1017   NA 143 10/18/2020 1028   K 3.9 06/14/2021 1017   CL 108 06/14/2021 1017   CO2 22 06/14/2021 1017   GLUCOSE 94 06/14/2021 1017   BUN 16 06/14/2021 1017   BUN 13 10/18/2020 1028   CREATININE 1.17 (H) 06/14/2021 1017   CREATININE 0.79 02/11/2017 0943   CALCIUM 9.0 06/14/2021 1017   PROT 7.0 06/14/2021 1017   PROT 6.7 10/18/2020 1028   ALBUMIN 3.4 (L) 06/14/2021 1017   ALBUMIN 4.1 10/18/2020 1028   AST 18 06/14/2021 1017   ALT 16 06/14/2021 1017   ALKPHOS 104 06/14/2021 1017    BILITOT 0.3 06/14/2021 1017   GFRNONAA 53 (L) 06/14/2021 1017   GFRNONAA 83 02/11/2017 0943   GFRAA 70 10/18/2020 1028   GFRAA >89 02/11/2017 0943    No results found for: SPEP, UPEP  Lab Results  Component Value Date   WBC 4.2 06/14/2021   NEUTROABS 2.0 06/14/2021   HGB 9.0 (L) 06/14/2021   HCT 26.9 (L) 06/14/2021   MCV 96.8 06/14/2021   PLT 126 (L) 06/14/2021      Chemistry      Component Value Date/Time   NA 142 06/14/2021 1017   NA 143 10/18/2020 1028   K 3.9 06/14/2021 1017   CL 108 06/14/2021 1017   CO2 22 06/14/2021 1017   BUN 16 06/14/2021 1017   BUN 13 10/18/2020 1028   CREATININE 1.17 (H) 06/14/2021 1017   CREATININE 0.79 02/11/2017 0943      Component Value Date/Time   CALCIUM 9.0 06/14/2021 1017   ALKPHOS 104 06/14/2021 1017   AST 18 06/14/2021 1017   ALT 16 06/14/2021 1017   BILITOT 0.3 06/14/2021 1017

## 2021-06-14 NOTE — Assessment & Plan Note (Signed)
She has intermittent fluctuation of her kidney function I will adjust the dose of carboplatin accordingly Observe closely

## 2021-06-14 NOTE — Assessment & Plan Note (Signed)
This is likely due to recent treatment. The patient denies recent history of bleeding such as epistaxis, hematuria or hematochezia. She is asymptomatic from the anemia. I will observe for now.  She does not require transfusion now. I will continue the chemotherapy at current dose without dosage adjustment.  

## 2021-06-14 NOTE — Assessment & Plan Note (Signed)
She does not want to try cryotherapy to reduce risk of peripheral neuropathy I plan to reduce the dose of Taxol

## 2021-06-14 NOTE — Assessment & Plan Note (Addendum)
Her recent CT imaging studies showed no residual signs of disease Previously noted lymphadenopathy and no longer detectable She has small little fluid collection in the left pelvic region, likely postop in nature I do not plan to repeat imaging study until November We will continue chemotherapy as scheduled; I will inform the GYN navigator to have her scheduled She will start radiation after completion of chemotherapy Due to worsening neuropathy, I plan to reduce the dose of Taxol further

## 2021-06-15 ENCOUNTER — Encounter: Payer: Self-pay | Admitting: Hematology and Oncology

## 2021-06-19 NOTE — Progress Notes (Addendum)
GYN Location of Tumor / Histology: Endometrial  RHYSE SKOWRON presented with symptoms of:  brownish spotting for a total of 2 months  Biopsies revealed: 01/29/2021   Past/Anticipated interventions by Gyn/Onc surgery, if any:  01/29/2021 Operation: Robotic-assisted laparoscopic total hysterectomy with bilateral salpingoophorectomy, SLN biopsy on the left, selective pelvic LND (external and common iliac on the right), omentectomy   Surgeon: Jeral Pinch MD  Past/Anticipated interventions by medical oncology, if any: Dr Alvy Bimler We discussed some of the risks, benefits, side-effects of carboplatin & Taxol. Treatment is intravenous, every 3 weeks x 6 cycles   Weight changes, if any: yes, approximately 10 pound weight loss over past 2 months  Bowel/Bladder complaints, if any: Yes.  ,  Urinary urgency  Nausea/Vomiting, if any: yes  Pain issues, if any:  yes, body aches, right knee pain with walking  SAFETY ISSUES: Prior radiation? no Pacemaker/ICD? no Possible current pregnancy? no, hysterectomy Is the patient on methotrexate? no  Current Complaints / other details:  neuropathy in fingers and toes  Vitals:   06/25/21 0748  BP: 139/77  Pulse: 81  Resp: 18  Temp: (!) 96.8 F (36 C)  TempSrc: Temporal  SpO2: 100%  Weight: 204 lb 8 oz (92.8 kg)  Height: 5\' 2"  (1.575 m)

## 2021-06-20 ENCOUNTER — Other Ambulatory Visit: Payer: Self-pay

## 2021-06-20 ENCOUNTER — Ambulatory Visit: Payer: Self-pay | Admitting: Physician Assistant

## 2021-06-20 ENCOUNTER — Encounter: Payer: Self-pay | Admitting: Physician Assistant

## 2021-06-20 ENCOUNTER — Encounter: Payer: Self-pay | Admitting: Hematology and Oncology

## 2021-06-20 VITALS — BP 123/84 | HR 104 | Temp 98.2°F | Resp 18 | Ht 62.0 in | Wt 200.0 lb

## 2021-06-20 DIAGNOSIS — E785 Hyperlipidemia, unspecified: Secondary | ICD-10-CM

## 2021-06-20 DIAGNOSIS — I10 Essential (primary) hypertension: Secondary | ICD-10-CM

## 2021-06-20 DIAGNOSIS — E1159 Type 2 diabetes mellitus with other circulatory complications: Secondary | ICD-10-CM

## 2021-06-20 DIAGNOSIS — I25119 Atherosclerotic heart disease of native coronary artery with unspecified angina pectoris: Secondary | ICD-10-CM

## 2021-06-20 DIAGNOSIS — I5032 Chronic diastolic (congestive) heart failure: Secondary | ICD-10-CM

## 2021-06-20 MED ORDER — ATORVASTATIN CALCIUM 80 MG PO TABS
ORAL_TABLET | Freq: Every day | ORAL | 6 refills | Status: DC
  Filled 2021-06-20 – 2021-07-23 (×2): qty 30, 30d supply, fill #0
  Filled 2021-08-29: qty 30, 30d supply, fill #1
  Filled 2021-10-30: qty 30, 30d supply, fill #2
  Filled 2022-01-12 – 2022-01-14 (×2): qty 30, 30d supply, fill #0
  Filled 2022-03-06: qty 30, 30d supply, fill #1

## 2021-06-20 MED ORDER — ISOSORBIDE MONONITRATE ER 30 MG PO TB24
60.0000 mg | ORAL_TABLET | Freq: Every day | ORAL | 2 refills | Status: DC
  Filled 2021-06-20 – 2021-07-23 (×2): qty 60, 30d supply, fill #0
  Filled 2021-08-29: qty 60, 30d supply, fill #1
  Filled 2021-10-30: qty 60, 30d supply, fill #2

## 2021-06-20 MED ORDER — GLIMEPIRIDE 4 MG PO TABS
ORAL_TABLET | ORAL | 6 refills | Status: DC
  Filled 2021-06-20 – 2021-07-23 (×2): qty 60, 30d supply, fill #0
  Filled 2021-08-29: qty 60, 30d supply, fill #1

## 2021-06-20 MED ORDER — TRULICITY 1.5 MG/0.5ML ~~LOC~~ SOAJ
1.5000 mg | SUBCUTANEOUS | 2 refills | Status: DC
  Filled 2021-06-20 – 2021-10-30 (×2): qty 2, 28d supply, fill #0

## 2021-06-20 MED ORDER — LISINOPRIL 20 MG PO TABS
20.0000 mg | ORAL_TABLET | Freq: Every day | ORAL | 2 refills | Status: DC
  Filled 2021-06-20 – 2021-07-23 (×2): qty 30, 30d supply, fill #0
  Filled 2021-08-29: qty 30, 30d supply, fill #1
  Filled 2021-10-30: qty 30, 30d supply, fill #2

## 2021-06-20 MED ORDER — FUROSEMIDE 20 MG PO TABS
40.0000 mg | ORAL_TABLET | Freq: Every day | ORAL | 2 refills | Status: DC
Start: 2021-06-20 — End: 2022-01-14
  Filled 2021-06-20 – 2021-07-23 (×2): qty 60, 30d supply, fill #0
  Filled 2021-08-29: qty 60, 30d supply, fill #1
  Filled 2021-10-30: qty 60, 30d supply, fill #2

## 2021-06-20 MED ORDER — METOPROLOL TARTRATE 100 MG PO TABS
ORAL_TABLET | Freq: Two times a day (BID) | ORAL | 2 refills | Status: DC
  Filled 2021-06-20 – 2021-07-23 (×2): qty 60, 30d supply, fill #0
  Filled 2021-08-29: qty 60, 30d supply, fill #1
  Filled 2021-10-30: qty 60, 30d supply, fill #2

## 2021-06-20 MED ORDER — METFORMIN HCL 500 MG PO TABS
1000.0000 mg | ORAL_TABLET | Freq: Two times a day (BID) | ORAL | 2 refills | Status: DC
  Filled 2021-06-20 – 2021-07-23 (×2): qty 120, 30d supply, fill #0
  Filled 2021-08-29: qty 120, 30d supply, fill #1
  Filled 2021-10-30: qty 120, 30d supply, fill #2

## 2021-06-20 MED ORDER — CLOPIDOGREL BISULFATE 75 MG PO TABS
ORAL_TABLET | ORAL | 6 refills | Status: DC
  Filled 2021-06-20: qty 30, 30d supply, fill #0

## 2021-06-20 MED ORDER — TRUEPLUS PEN NEEDLES 32G X 4 MM MISC
3 refills | Status: DC
  Filled 2021-06-20: qty 100, 25d supply, fill #0

## 2021-06-20 NOTE — Progress Notes (Signed)
Established Patient Office Visit  Subjective:  Patient ID: [name], female    DOB: August 17, 1956  Age: 65 y.o. MRN: [id number]  CC:  Chief Complaint  Patient presents with   Medication Refill    HPI [name] presents for medication refills without any concerns.  Reports that she continues her cancer treatments.  Reports that her blood pressure readings have been within normal limits.  Does endorse that she has been having overall body aches, does attribute this to her cancer treatment.    Past Medical History:  Diagnosis Date   Anginal pain ([place]) 2016   Cancer [facility])    Coronary atherosclerosis of native coronary artery, with stent to LCX in 2006 06/04/2013   Diabetes mellitus without complication (HCC)    GERD (gastroesophageal reflux disease)    Heart attack ([place]) 2006   Hyperlipidemia LDL goal < 70 06/07/2013   Hypertension    Obesity    S/P coronary artery stent placement to RCA with PROMUS DES, 06/07/13, residual disease on OM1, OM2 and rPDA 06/07/2013    Past Surgical History:  Procedure Laterality Date   CARDIAC CATHETERIZATION N/A 08/22/2015   Procedure: Left Heart Cath and Coronary Angiography;  Surgeon: Leonie Man, MD;  Location: [place] [facility];  Service: Cardiovascular;  Laterality: N/A;   CARDIAC CATHETERIZATION N/A 08/22/2015   Procedure: Coronary Stent Intervention;  Surgeon: Leonie Man, MD;  Location: [place] [facility];  Service: Cardiovascular;  Laterality: N/A;   CARDIAC CATHETERIZATION N/A 08/22/2015   Procedure: Left Heart Cath and Coronary Angiography;  Surgeon: Leonie Man, MD;  Location: [place] [facility];  Service: Cardiovascular;  Laterality: N/A;   CORONARY STENT PLACEMENT  2006   TAXUS stent CFX in [facility]   IR IMAGING GUIDED PORT INSERTION  02/20/2021   LEFT HEART CATH AND CORONARY ANGIOGRAPHY N/A 07/21/2018   Procedure: LEFT HEART CATH AND CORONARY ANGIOGRAPHY;  Surgeon: Troy Sine, MD;  Location: [place] [facility];   Service: Cardiovascular;  Laterality: N/A;   LEFT HEART CATHETERIZATION WITH CORONARY ANGIOGRAM N/A 06/07/2013   Procedure: LEFT HEART CATHETERIZATION WITH CORONARY ANGIOGRAM;  Surgeon: Sanda Klein, MD;  Location: [place] CATH LAB;  Service: Cardiovascular;  Laterality: N/A;   LYMPH NODE DISSECTION N/A 01/29/2021   Procedure: SELECTIVE LYMPH NODE DISSECTION;  Surgeon: Lafonda Mosses, MD;  Location: [facility];  Service: Gynecology;  Laterality: N/A;   ROBOTIC ASSISTED TOTAL HYSTERECTOMY WITH BILATERAL SALPINGO OOPHERECTOMY Bilateral 01/29/2021   Procedure: XI ROBOTIC ASSISTED TOTAL HYSTERECTOMY WITH BILATERAL SALPINGO OOPHORECTOMY,;  Surgeon: Lafonda Mosses, MD;  Location: [facility];  Service: Gynecology;  Laterality: Bilateral;   SENTINEL NODE BIOPSY N/A 01/29/2021   Procedure: SENTINEL NODE BIOPSY;  Surgeon: Lafonda Mosses, MD;  Location: [facility];  Service: Gynecology;  Laterality: N/A;    Family History  Problem Relation Age of Onset   Diabetes Mother    Hypertension Mother    Diabetes Sister    Diabetes Brother    Hypertension Brother    Colon cancer Father    Breast cancer Neg Hx    Ovarian cancer Neg Hx    Endometrial cancer Neg Hx    Prostate cancer Neg Hx    Pancreatic cancer Neg Hx     Social History   Socioeconomic History   Marital status: Divorced    Spouse name: Not on file   Number of children: 1   Years of education: Not on file  Highest education level: Not on file  Occupational History   Occupation: Scientist, water quality  Tobacco Use   Smoking status: Former    Pack years: 0.00    Types: Cigars    Quit date: 06/23/2004    Years since quitting: 17.0   Smokeless tobacco: Never   Tobacco comments:    "a few black and milds a day" CigrettesX 30 years  Vaping Use   Vaping Use: Never used  Substance and Sexual Activity   Alcohol use: Yes    Comment: occ   Drug use: No   Sexual activity: Not Currently  Other Topics Concern   Not on file  Social History Narrative    Not on file   Social Determinants of Health   Financial Resource Strain: Not on file  Food Insecurity: No Food Insecurity   Worried About Running Out of Food in the Last Year: Never true   Ran Out of Food in the Last Year: Never true  Transportation Needs: No Transportation Needs   Lack of Transportation (Medical): No   Lack of Transportation (Non-Medical): No  Physical Activity: Not on file  Stress: Not on file  Social Connections: Not on file  Intimate Partner Violence: Not on file    Outpatient Medications Prior to Visit  Medication Sig Dispense Refill   acetaminophen (TYLENOL) 500 MG tablet Take 1 tablet (500 mg total) by mouth every 6 (six) hours as needed. 30 tablet 0   bismuth subsalicylate (PEPTO BISMOL) 262 MG/15ML suspension Take 30 mLs by mouth every 6 (six) hours as needed for indigestion or diarrhea or loose stools.     dexamethasone (DECADRON) 4 MG tablet TAKE 2 TABLETS BY MOUTH THE NIGHT BEFORE CHEMOTHERAPY, EVERY 3 WEEKS, X 6 CYCLES 12 tablet 6   doxazosin (CARDURA) 2 MG tablet TAKE 1 TABLET BY MOUTH DAILY. 30 tablet 1   lidocaine-prilocaine (EMLA) cream APPLY TO AFFECTED AREA ONCE AS DIRECTED 30 g 3   nitroGLYCERIN (NITROSTAT) 0.4 MG SL tablet Place 1 tablet (0.4 mg total) under the tongue every 5 (five) minutes as needed for chest pain. 25 tablet 2   ondansetron (ZOFRAN) 8 MG tablet TAKE 1 TABLET BY MOUTH EVERY 8 HOURS AS NEEDED. 30 tablet 1   prochlorperazine (COMPAZINE) 10 MG tablet TAKE 1 TABLET BY MOUTH EVERY 6 HOURS AS NEEDED (NAUSEA OR VOMITING). 30 tablet 1   traMADol (ULTRAM) 50 MG tablet Take 1 tablet (50 mg total) by mouth every 6 (six) hours as needed. 30 tablet 0   atorvastatin (LIPITOR) 80 MG tablet TAKE 1 TABLET (80 MG TOTAL) BY MOUTH AT BEDTIME. 30 tablet 6   clopidogrel (PLAVIX) 75 MG tablet Take 1 tablet by mouth daily with breakfast 30 tablet 6   Dulaglutide (TRULICITY) 1.5 UJ/8.1XB SOPN Inject 1.5 mg into the skin once a week. (Patient taking  differently: Inject 1.5 mg into the skin every Friday.) 4 pen 3   furosemide (LASIX) 20 MG tablet Take 2 tablets (40 mg total) by mouth daily. Please make PCP visit. 60 tablet 0   glimepiride (AMARYL) 4 MG tablet TAKE 2 TABLETS (8 MG TOTAL) BY MOUTH DAILY WITH BREAKFAST. 60 tablet 6   Insulin Pen Needle (TRUEPLUS PEN NEEDLES) 32G X 4 MM MISC Use as directed to inject victoza once daily 100 each 3   isosorbide mononitrate (IMDUR) 30 MG 24 hr tablet Take 2 tablets (60 mg total) by mouth daily. Please make PCP appointment. 60 tablet 0   lisinopril (ZESTRIL) 20 MG tablet TAKE  1 TABLET BY MOUTH ONCE A DAY 30 tablet 9   metFORMIN (GLUCOPHAGE) 500 MG tablet Take 2 tablets (1,000 mg total) by mouth 2 (two) times daily with a meal. Please make PCP appt. 120 tablet 0   metoprolol tartrate (LOPRESSOR) 100 MG tablet TAKE 1 TABLET (100 MG TOTAL) BY MOUTH 2 (TWO) TIMES DAILY. 60 tablet 6   aspirin EC 81 MG EC tablet Take 1 tablet (81 mg total) by mouth daily. (Patient not taking: Reported on 06/20/2021)     pantoprazole (PROTONIX) 40 MG tablet TAKE 1 TABLET (40 MG TOTAL) BY MOUTH DAILY. (Patient not taking: No sig reported) 30 tablet 6   senna-docusate (SENOKOT-S) 8.6-50 MG tablet TAKE 2 TABLETS BY MOUTH AT BEDTIME. FOR AFTER SURGERY, DO NOT TAKE IF HAVING DIARRHEA (Patient not taking: Reported on 06/20/2021) 30 tablet 0   Facility-Administered Medications Prior to Visit  Medication Dose Route Frequency Provider Last Rate Last Admin   0.9 %  sodium chloride infusion   Intravenous Once [name], MD        No Known Allergies  ROS Review of Systems  Constitutional: Negative.   HENT: Negative.    Eyes: Negative.   Respiratory:  Negative for shortness of breath.   Cardiovascular:  Negative for chest pain.  Gastrointestinal: Negative.   Endocrine: Negative.   Genitourinary: Negative.   Musculoskeletal:  Positive for arthralgias and myalgias.  Skin: Negative.   Allergic/Immunologic: Negative.    Neurological: Negative.   Hematological: Negative.   Psychiatric/Behavioral: Negative.       Objective:    Physical Exam Vitals and nursing note reviewed.  Constitutional:      Appearance: Normal appearance.  HENT:     Head: Normocephalic and atraumatic.     Right Ear: External ear normal.     Left Ear: External ear normal.     Nose: Nose normal.     Mouth/Throat:     Mouth: Mucous membranes are moist.     Pharynx: Oropharynx is clear.  Eyes:     Extraocular Movements: Extraocular movements intact.     Conjunctiva/sclera: Conjunctivae normal.     Pupils: Pupils are equal, round, and reactive to light.  Cardiovascular:     Rate and Rhythm: Normal rate and regular rhythm.     Pulses: Normal pulses.     Heart sounds: Normal heart sounds.  Pulmonary:     Effort: Pulmonary effort is normal.     Breath sounds: Normal breath sounds.  Musculoskeletal:        General: Normal range of motion.     Cervical back: Normal range of motion and neck supple.  Skin:    General: Skin is warm and dry.  Neurological:     General: No focal deficit present.     Mental Status: She is alert and oriented to person, place, and time.  Psychiatric:        Mood and Affect: Mood normal.        Behavior: Behavior normal.        Thought Content: Thought content normal.        Judgment: Judgment normal.    BP 123/84 (BP Location: Left Arm, Patient Position: Sitting, Cuff Size: Normal)   Pulse (!) 104   Temp 98.2 F (36.8 C) (Oral)   Resp 18   Ht 5\' 2"  (1.575 m)   Wt 200 lb (90.7 kg)   SpO2 97%   BMI 36.58 kg/m  Wt Readings from Last 3 Encounters:  06/20/21  200 lb (90.7 kg)  06/14/21 204 lb (92.5 kg)  05/24/21 209 lb 9.6 oz (95.1 kg)     Health Maintenance Due  Topic Date Due   COVID-19 Vaccine (1) Never done   Pneumococcal Vaccine 28-62 Years old (1 - PCV) Never done   OPHTHALMOLOGY EXAM  Never done   Zoster Vaccines- Shingrix (1 of 2) Never done   COLONOSCOPY (Pts 45-52yrs  Insurance coverage will need to be confirmed)  Never done   MAMMOGRAM  06/07/2010   FOOT EXAM  01/17/2021    There are no preventive care reminders to display for this patient.  Lab Results  Component Value Date   TSH 0.997 08/27/2015   Lab Results  Component Value Date   WBC 4.2 06/14/2021   HGB 9.0 (L) 06/14/2021   HCT 26.9 (L) 06/14/2021   MCV 96.8 06/14/2021   PLT 126 (L) 06/14/2021   Lab Results  Component Value Date   NA 142 06/14/2021   K 3.9 06/14/2021   CO2 22 06/14/2021   GLUCOSE 94 06/14/2021   BUN 16 06/14/2021   CREATININE 1.17 (H) 06/14/2021   BILITOT 0.3 06/14/2021   ALKPHOS 104 06/14/2021   AST 18 06/14/2021   ALT 16 06/14/2021   PROT 7.0 06/14/2021   ALBUMIN 3.4 (L) 06/14/2021   CALCIUM 9.0 06/14/2021   ANIONGAP 12 06/14/2021   Lab Results  Component Value Date   CHOL 111 01/18/2020   Lab Results  Component Value Date   HDL 35 (L) 01/18/2020   Lab Results  Component Value Date   LDLCALC 51 01/18/2020   Lab Results  Component Value Date   TRIG 146 01/18/2020   Lab Results  Component Value Date   CHOLHDL 3.2 01/18/2020   Lab Results  Component Value Date   HGBA1C 7.1 (H) 01/16/2021      Assessment & Plan:   Problem List Items Addressed This Visit       Cardiovascular and Mediastinum   Essential hypertension   Relevant Medications   atorvastatin (LIPITOR) 80 MG tablet   furosemide (LASIX) 20 MG tablet   isosorbide mononitrate (IMDUR) 30 MG 24 hr tablet   lisinopril (ZESTRIL) 20 MG tablet   metoprolol tartrate (LOPRESSOR) 100 MG tablet   Coronary artery disease involving native coronary artery of native heart with angina pectoris (HCC)   Relevant Medications   atorvastatin (LIPITOR) 80 MG tablet   clopidogrel (PLAVIX) 75 MG tablet   furosemide (LASIX) 20 MG tablet   isosorbide mononitrate (IMDUR) 30 MG 24 hr tablet   lisinopril (ZESTRIL) 20 MG tablet   metoprolol tartrate (LOPRESSOR) 100 MG tablet     Other    Dyslipidemia - Primary   Relevant Medications   atorvastatin (LIPITOR) 80 MG tablet   Other Relevant Orders   Lipid panel   Other Visit Diagnoses     Type 2 diabetes mellitus with other circulatory complication, without long-term current use of insulin (HCC)       Relevant Medications   atorvastatin (LIPITOR) 80 MG tablet   Dulaglutide (TRULICITY) 1.5 XA/1.2IN SOPN   glimepiride (AMARYL) 4 MG tablet   Insulin Pen Needle (TRUEPLUS PEN NEEDLES) 32G X 4 MM MISC   lisinopril (ZESTRIL) 20 MG tablet   metFORMIN (GLUCOPHAGE) 500 MG tablet   Chronic diastolic heart failure (HCC)       Relevant Medications   atorvastatin (LIPITOR) 80 MG tablet   furosemide (LASIX) 20 MG tablet   isosorbide mononitrate (IMDUR) 30 MG 24  hr tablet   lisinopril (ZESTRIL) 20 MG tablet   metoprolol tartrate (LOPRESSOR) 100 MG tablet       Meds ordered this encounter  Medications   atorvastatin (LIPITOR) 80 MG tablet    Sig: TAKE 1 TABLET (80 MG TOTAL) BY MOUTH AT BEDTIME.    Dispense:  30 tablet    Refill:  6    Order Specific Question:   Supervising Provider    Answer:   Elsie Stain [[id number]]   clopidogrel (PLAVIX) 75 MG tablet    Sig: Take 1 tablet by mouth daily with breakfast    Dispense:  30 tablet    Refill:  6    Order Specific Question:   Supervising Provider    Answer:   Elsie Stain [[id number]]   Dulaglutide (TRULICITY) 1.5 ZO/1.0RU SOPN    Sig: Inject 1.5 mg into the skin once a week.    Dispense:  2 mL    Refill:  2    Order Specific Question:   Supervising Provider    Answer:   Elsie Stain [[id number]]   furosemide (LASIX) 20 MG tablet    Sig: Take 2 tablets (40 mg total) by mouth daily.    Dispense:  60 tablet    Refill:  2    Order Specific Question:   Supervising Provider    Answer:   Joya Gaskins, PATRICK E [[id number]]   glimepiride (AMARYL) 4 MG tablet    Sig: TAKE 2 TABLETS (8 MG TOTAL) BY MOUTH DAILY WITH BREAKFAST.    Dispense:  60 tablet    Refill:  6    Order Specific  Question:   Supervising Provider    Answer:   Elsie Stain [[id number]]   Insulin Pen Needle (TRUEPLUS PEN NEEDLES) 32G X 4 MM MISC    Sig: Use as directed to inject victoza once daily    Dispense:  100 each    Refill:  3    Order Specific Question:   Supervising Provider    Answer:   Elsie Stain [[id number]]   isosorbide mononitrate (IMDUR) 30 MG 24 hr tablet    Sig: Take 2 tablets (60 mg total) by mouth daily.    Dispense:  60 tablet    Refill:  2    Order Specific Question:   Supervising Provider    Answer:   Joya Gaskins, PATRICK E [[id number]]   lisinopril (ZESTRIL) 20 MG tablet    Sig: TAKE 1 TABLET BY MOUTH ONCE A DAY    Dispense:  30 tablet    Refill:  2    Order Specific Question:   Supervising Provider    Answer:   Elsie Stain [[id number]]   metFORMIN (GLUCOPHAGE) 500 MG tablet    Sig: Take 2 tablets (1,000 mg total) by mouth 2 (two) times daily with a meal.    Dispense:  120 tablet    Refill:  2    Order Specific Question:   Supervising Provider    Answer:   Elsie Stain [[id number]]   metoprolol tartrate (LOPRESSOR) 100 MG tablet    Sig: TAKE 1 TABLET (100 MG TOTAL) BY MOUTH 2 (TWO) TIMES DAILY.    Dispense:  60 tablet    Refill:  2    Order Specific Question:   Supervising Provider    Answer:   WRIGHT, PATRICK E [[id number]]  1. Type 2 diabetes mellitus with other circulatory complication, without long-term current use of insulin (HCC) Continue current regimen.  Patient was given an appointment to follow-up with primary care provider in 3 months.  Patient encouraged to check blood glucose levels at home, keep a written log and have available for all office visits. - Dulaglutide (TRULICITY) 1.5 KL/4.9ZP SOPN; Inject 1.5 mg into the skin once a week.  Dispense: 2 mL; Refill: 2 - glimepiride (AMARYL) 4 MG tablet; TAKE 2 TABLETS (8 MG TOTAL) BY MOUTH DAILY WITH BREAKFAST.  Dispense: 60 tablet; Refill: 6 - Insulin Pen Needle (TRUEPLUS PEN NEEDLES) 32G X 4 MM MISC; Use as directed to inject  victoza once daily  Dispense: 100 each; Refill: 3 - metFORMIN (GLUCOPHAGE) 500 MG tablet; Take 2 tablets (1,000 mg total) by mouth 2 (two) times daily with a meal.  Dispense: 120 tablet; Refill: 2  2. Dyslipidemia Continue current regimen  - atorvastatin (LIPITOR) 80 MG tablet; TAKE 1 TABLET (80 MG TOTAL) BY MOUTH AT BEDTIME.  Dispense: 30 tablet; Refill: 6  3. Chronic diastolic heart failure (HCC) Continue current regimen - furosemide (LASIX) 20 MG tablet; Take 2 tablets (40 mg total) by mouth daily.  Dispense: 60 tablet; Refill: 2 - isosorbide mononitrate (IMDUR) 30 MG 24 hr tablet; Take 2 tablets (60 mg total) by mouth daily.  Dispense: 60 tablet; Refill: 2  4. Essential hypertension Continue current regimen.  Patient encouraged to check blood pressure at home, keep a written log and have available for all office visits - lisinopril (ZESTRIL) 20 MG tablet; TAKE 1 TABLET BY MOUTH ONCE A DAY  Dispense: 30 tablet; Refill: 2 - metoprolol tartrate (LOPRESSOR) 100 MG tablet; TAKE 1 TABLET (100 MG TOTAL) BY MOUTH 2 (TWO) TIMES DAILY.  Dispense: 60 tablet; Refill: 2  5. Coronary artery disease involving native coronary artery of native heart with angina pectoris (HCC) Continue current regimen - clopidogrel (PLAVIX) 75 MG tablet; Take 1 tablet by mouth daily with breakfast  Dispense: 30 tablet; Refill: 6    I have reviewed the patient's medical history (PMH, PSH, Social History, Family History, Medications, and allergies) , and have been updated if relevant. I spent 31 minutes reviewing chart and  face to face time with patient.   Follow-up: Return in about 3 months (around 09/20/2021) for At [facility].    Loraine Grip Mayers, PA-C

## 2021-06-20 NOTE — Patient Instructions (Signed)
Please make sure that you [facility]  Please make sure that you call them the day ahead of time to let them know that you are coming so the lab is aware.  We will call you with the lab results as soon as they are available   Please let us know if there is anything else we can do for you.  Kennieth Rad, PA-C Physician Assistant [facility] [email or web address]   Lipid Profile Test Why am I having this test? The lipid profile test can be used to help evaluate your risk for developing heart disease. The test is also used to monitor your levels during treatmentfor high cholesterol to see if you are reaching your goals. What is being tested? A lipid profile measures the following: Total cholesterol. Cholesterol is a waxy, fat-like substance in your blood. If your total cholesterol level is high, this can increase your risk for heart disease. High-density lipoprotein (HDL). This is known as the good cholesterol. Having high levels of HDL decreases your risk for heart disease. Your HDL level may be low if you smoke or do not get enough exercise. Low-density lipoprotein (LDL). This is known as the bad cholesterol. This type causes plaque to build up in your arteries. Having a low level of LDL is best. Having high levels of LDL increases your risk for heart disease. Cholesterol to HDL ratio. This is calculated by dividing your total cholesterol by your HDL cholesterol. The ratio is used by health care providers to determine your risk for heart disease. A low ratio is best. Triglycerides. These are fats that your body can store or burn for energy. Low levels are best. Having high levels of triglycerides increases your risk for heart disease. What kind of sample is taken?  A blood sample is required for this test. It is usually collected by insertinga needle into a blood  vessel. How do I prepare for this test? Do not drink alcohol starting at least 24 hours before your test. Follow any instructions from your health care provider about dietaryrestrictions before your test. Do not eat or drink anything other than water after midnight on the night before thetest, or as told by your health care provider. Tell a health care provider about: All medicines you are taking, including vitamins, herbs, eye drops, creams, and over-the-counter medicines. Any medical conditions you have. Whether you are pregnant or may be pregnant. How are the results reported? Your test results will be reported as values that indicate your cholesterol and triglyceride levels. Your health care provider will compare your results to normal ranges that were established after testing a large group of people (reference ranges). Reference ranges may vary among labs and hospitals. For this test, commonreference ranges are: Total cholesterol Adult or elderly: less than 200 mg/dL. Child: 120-200 mg/dL. Infant: 70-175 mg/dL. Newborn: 53-135 mg/dL. HDL Female: greater than 45 mg/dL. Female: greater than 55 mg/dL. HDL reference values based on your risk for heart disease: Low risk for heart disease: Female: 60 mg/dL. Female: 70 mg/dL. Moderate risk for heart disease: Female: 45 mg/dL. Female: 55 mg/dL. High risk for heart disease: Female: 25 mg/dL. Female: 35 mg/dL. LDL Adults: Your health care provider will determine a target level for LDL based on your risk for heart disease. If you are at low risk, your LDL should be 130 mg/dL or less. If you are at moderate risk, your LDL should be 100  mg/dL or less. If you are at high risk, your LDL should be 70 mg/dL or less. Children: less than 110 mg/dL. Cholesterol to HDL ratio Reference values based on your risk for heart disease: Risk that is half the average risk: Female: 3.4. Female: 3.3. Average risk: Female: 5.0. Female: 4.4. Risk that is two  times average (moderate risk): Female: 10.0. Female: 7.0. Risk that is three times average (high risk): Female: 24.0. Female: 11.0. Triglycerides Adult or elderly: Female: 40-160 mg/dL. Female: 35-135 mg/dL. Children 17-109 years old: Female: 40-163 mg/dL. Female: 40-128 mg/dL. Children 64-82 years old: Female: 36-138 mg/dL. Female: 41-138 mg/dL. Children 60-41 years old: Female: 31-108 mg/dL. Female: 35-114 mg/dL. Children 9-65 years old: Female: 30-86 mg/dL. Female: 32-99 mg/dL. Triglycerides should be less than 400 mg/dL even when you are not fasting. What do the results mean? Results that are within the reference ranges are considered normal. Total cholesterol, LDL, and triglyceride levels that are higher than the reference ranges can mean that you have an increased risk for heart disease. An HDL levelthat is lower than the reference range can also indicate an increased risk. Talk with your health care provider about what your results mean. Questions to ask your health care provider Ask your health care provider, or the department that is doing the test: When will my results be ready? How will I get my results? What are my treatment options? What other tests do I need? What are my next steps? Summary The lipid profile test can be used to help predict the likelihood that you will develop heart disease. It can also help monitor your cholesterol levels during treatment. A lipid profile measures your total cholesterol, high-density lipoprotein (HDL), low-density lipoprotein (LDL), cholesterol to HDL ratio, and triglycerides. Total cholesterol, LDL, and triglyceride levels that are higher than the reference ranges can indicate an increased risk for heart disease. An HDL level that is lower than the reference range can indicate an increased risk for heart disease. Talk with your health care provider about what your results mean. This information is not intended to replace advice given to you by  your health care provider. Make sure you discuss any questions you have with your healthcare provider. Document Revised: 03/22/2020 Document Reviewed: 03/22/2020 Elsevier Patient Education  [employer].

## 2021-06-20 NOTE — Progress Notes (Signed)
Patient has not taken medication today and patient has eaten. Patient completed a round of chemo prior to this appointment. Patient reports generalized aches scaled at an 8 currently. Patient request refills on medications.

## 2021-06-21 DIAGNOSIS — E119 Type 2 diabetes mellitus without complications: Secondary | ICD-10-CM | POA: Insufficient documentation

## 2021-06-22 NOTE — Progress Notes (Signed)
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 female who is seen as a courtesy of Dr. Berline Lopes for an opinion concerning radiation therapy as part of management for her recently diagnosed endometrial cancer. Today, she is accompanied by her her daughter. The patient presented to Dr. Margarita Rana, PCP, on 12/14/2020 for evaluation of postmenopausal spotting. PAP smear was performed at that time and revealed adenocarcinoma.   Subsequently, the patient was seen by Dr. Hale Bogus, gynecologist, who performed an endocervical curettage and endometrial biopsy that revealed adenocarcinoma.  CT scan of chest, abdomen, and pelvis on 01/25/2021 showed small volume fluid in the endometrial canal with a 7 mm hypoattenuating lesion in the myometrium of the anterior fundus. There was no definite evidence for metastatic disease in the chest, abdomen, or pelvis. However, there were small lymph nodes noted along both pelvic sidewalls, a 5 mm ground-glass opacity in the peripheral left upper lobe (likely related to infection/inflammation and potentially scar), and a 5.8 cm lesion in the posterior lower uterine segment that was likely a fibroid.  Dr. Berline Lopes performed a robotic-assisted laparoscopic total hysterectomy with bilateral salpingo-oophorectomy, sentinel lymph node biopsy on the left, selective pelvic LND (external and common iliac on the right), and an omentectomy on 01/29/2021. Pathology from the procedure revealed high-garde serous carcinoma of the endometrium with invasion of more than  half of the myometrium. The tumor invaded the stromal connective tissue of the cervix and there was also noted to be lymphovascular invasion. However, there was no involvement of the uterine serosa or adnexa. The omentum was negative for carcinoma. Two right pelvic lymph nodes were negative for carcinoma. 1/1 left obturator sentinel lymph node, 1/3 left pelvic lymph nodes, and 1/1 right external and common iliac lymph node were positive for metastatic carcinoma.  Operative findings of note were that the patient was noted to have several enlarged pelvic nodes, many of which were removed and returned positive for malignancy.  The patient's case was presented at the Gynecologic Oncology Multidisciplinary Conference on 02/12/2021, at which time it was recommended that she proceed with systemic chemotherapy with Carboplatin/Taxol and Trastuzumab followed by external beam radiation to the pelvis and consideration for vaginal brachytherapy.  The patient began chemotherapy on 02/28/21 (carboplatin AUC 6/ paclitaxel Q21D).   Recent imaging includes a CT of the abdomen and pelvis taken on 04/20/21 which showed a 2.2 cm fluid filled density lesion along the left pelvis sidewall likely reflective of postoperative seroma. Otherwise there were no findings suspicious for recurrent or metastatic disease.         During a visit was Dr. Alvy Bimler on 06/14/21 it was noted that the patient had anemia due to antineoplastic chemotherapy; noted to be likely due to recent treatment. 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   She has recently completed her adjuvant chemotherapy.  This treatment has had a significant effect on her overall performance status.  Patient reports being in  bed most of the  time after her chemotherapy for 2 to 3 weeks.  she denies any pelvic pain vaginal  bleeding or discharge.                PREVIOUS RADIATION THERAPY: No  PAST MEDICAL HISTORY:  Past Medical History:  Diagnosis Date   Anginal pain ([place]) 2016   Cancer ([place])    Coronary atherosclerosis of native coronary artery, with stent to LCX in 2006 06/04/2013   Diabetes mellitus without complication ([facility])    GERD (gastroesophageal reflux disease)    Heart attack ([place]) 2006   Hyperlipidemia LDL goal < 70 06/07/2013   Hypertension    Obesity    S/P coronary artery stent placement to RCA with PROMUS DES, 06/07/13, residual disease on OM1, OM2 and rPDA 06/07/2013    PAST SURGICAL HISTORY: Past Surgical History:  Procedure Laterality Date   CARDIAC CATHETERIZATION N/A 08/22/2015   Procedure: Left Heart Cath and Coronary Angiography;  Surgeon: Leonie Man, MD;  Location: [facility];  Service: Cardiovascular;  Laterality: N/A;   CARDIAC CATHETERIZATION N/A 08/22/2015   Procedure: Coronary Stent Intervention;  Surgeon: Leonie Man, MD;  Location: [place];  Service: Cardiovascular;  Laterality: N/A;   CARDIAC CATHETERIZATION N/A 08/22/2015   Procedure: Left Heart Cath and Coronary Angiography;  Surgeon: Leonie Man, MD;  Location: [place] [facility];  Service: Cardiovascular;  Laterality: N/A;   CORONARY STENT PLACEMENT  2006   TAXUS stent CFX in [facility]   IR IMAGING GUIDED PORT INSERTION  02/20/2021   LEFT HEART CATH AND CORONARY ANGIOGRAPHY N/A 07/21/2018   Procedure: LEFT HEART CATH AND CORONARY ANGIOGRAPHY;  Surgeon: Troy Sine, MD;  Location: [facility];  Service: Cardiovascular;  Laterality: N/A;   LEFT HEART CATHETERIZATION WITH CORONARY ANGIOGRAM N/A 06/07/2013   Procedure: LEFT HEART CATHETERIZATION WITH CORONARY ANGIOGRAM;  Surgeon: Sanda Klein, MD;  Location: [facility] [facility];  Service: Cardiovascular;  Laterality: N/A;   LYMPH NODE DISSECTION N/A 01/29/2021   Procedure: SELECTIVE LYMPH NODE DISSECTION;  Surgeon: Lafonda Mosses, MD;   Location: [facility];  Service: Gynecology;  Laterality: N/A;   ROBOTIC ASSISTED TOTAL HYSTERECTOMY WITH BILATERAL SALPINGO OOPHERECTOMY Bilateral 01/29/2021   Procedure: XI ROBOTIC ASSISTED TOTAL HYSTERECTOMY WITH BILATERAL SALPINGO OOPHORECTOMY,;  Surgeon: Lafonda Mosses, MD;  Location: [facility];  Service: Gynecology;  Laterality: Bilateral;   SENTINEL NODE BIOPSY N/A 01/29/2021   Procedure: SENTINEL NODE BIOPSY;  Surgeon: Lafonda Mosses, MD;  Location: [facility];  Service: Gynecology;  Laterality: N/A;    FAMILY HISTORY:  Family History  Problem Relation Age of Onset   Diabetes Mother    Hypertension Mother    Diabetes Sister    Diabetes Brother    Hypertension Brother    Colon cancer Father    Breast cancer Neg Hx    Ovarian cancer Neg Hx    Endometrial cancer Neg Hx    Prostate cancer Neg Hx    Pancreatic cancer Neg Hx     SOCIAL HISTORY:  Social History   Tobacco Use   Smoking status: Former    Pack years: 0.00    Types: Cigars    Quit date: 06/23/2004    Years since quitting: 17.0   Smokeless tobacco: Never   Tobacco comments:    "a few black and milds a day" CigrettesX 30 years  Vaping Use   Vaping Use: Never used  Substance Use Topics   Alcohol use: Yes  Comment: occ   Drug use: No    ALLERGIES: No Known Allergies  MEDICATIONS:  Current Outpatient Medications  Medication Sig Dispense Refill   acetaminophen (TYLENOL) 500 MG tablet Take 1 tablet (500 mg total) by mouth every 6 (six) hours as needed. 30 tablet 0   aspirin EC 81 MG EC tablet Take 1 tablet (81 mg total) by mouth daily.     atorvastatin (LIPITOR) 80 MG tablet TAKE 1 TABLET (80 MG TOTAL) BY MOUTH AT BEDTIME. 30 tablet 6   bismuth subsalicylate (PEPTO BISMOL) 262 MG/15ML suspension Take 30 mLs by mouth every 6 (six) hours as needed for indigestion or diarrhea or loose stools.     clopidogrel (PLAVIX) 75 MG tablet Take 1 tablet by mouth daily with breakfast 30 tablet 6   Dulaglutide (TRULICITY) 1.5  TK/1.6WF SOPN Inject 1.5 mg into the skin once a week. 2 mL 2   furosemide (LASIX) 20 MG tablet Take 2 tablets (40 mg total) by mouth daily. 60 tablet 2   glimepiride (AMARYL) 4 MG tablet TAKE 2 TABLETS (8 MG TOTAL) BY MOUTH DAILY WITH BREAKFAST. 60 tablet 6   Insulin Pen Needle (TRUEPLUS PEN NEEDLES) 32G X 4 MM MISC Use as directed to inject victoza once daily 100 each 3   isosorbide mononitrate (IMDUR) 30 MG 24 hr tablet Take 2 tablets (60 mg total) by mouth daily. 60 tablet 2   lidocaine-prilocaine (EMLA) cream APPLY TO AFFECTED AREA ONCE AS DIRECTED 30 g 3   lisinopril (ZESTRIL) 20 MG tablet TAKE 1 TABLET BY MOUTH ONCE A DAY 30 tablet 2   metFORMIN (GLUCOPHAGE) 500 MG tablet Take 2 tablets (1,000 mg total) by mouth 2 (two) times daily with a meal. 120 tablet 2   metoprolol tartrate (LOPRESSOR) 100 MG tablet TAKE 1 TABLET (100 MG TOTAL) BY MOUTH 2 (TWO) TIMES DAILY. 60 tablet 2   nitroGLYCERIN (NITROSTAT) 0.4 MG SL tablet Place 1 tablet (0.4 mg total) under the tongue every 5 (five) minutes as needed for chest pain. 25 tablet 2   pantoprazole (PROTONIX) 40 MG tablet TAKE 1 TABLET (40 MG TOTAL) BY MOUTH DAILY. 30 tablet 6   traMADol (ULTRAM) 50 MG tablet Take 1 tablet (50 mg total) by mouth every 6 (six) hours as needed. 30 tablet 0   dexamethasone (DECADRON) 4 MG tablet TAKE 2 TABLETS BY MOUTH THE NIGHT BEFORE CHEMOTHERAPY, EVERY 3 WEEKS, X 6 CYCLES (Patient not taking: Reported on 06/25/2021) 12 tablet 6   doxazosin (CARDURA) 2 MG tablet TAKE 1 TABLET BY MOUTH DAILY. (Patient not taking: Reported on 06/25/2021) 30 tablet 1   ondansetron (ZOFRAN) 8 MG tablet TAKE 1 TABLET BY MOUTH EVERY 8 HOURS AS NEEDED. (Patient not taking: Reported on 06/25/2021) 30 tablet 1   prochlorperazine (COMPAZINE) 10 MG tablet TAKE 1 TABLET BY MOUTH EVERY 6 HOURS AS NEEDED (NAUSEA OR VOMITING). (Patient not taking: Reported on 06/25/2021) 30 tablet 1   senna-docusate (SENOKOT-S) 8.6-50 MG tablet TAKE 2 TABLETS BY MOUTH AT  BEDTIME. FOR AFTER SURGERY, DO NOT TAKE IF HAVING DIARRHEA (Patient not taking: No sig reported) 30 tablet 0   No current facility-administered medications for this encounter.   Facility-Administered Medications Ordered in Other Encounters  Medication Dose Route Frequency Provider Last Rate Last Admin   0.9 %  sodium chloride infusion   Intravenous Once [name], MD        REVIEW OF SYSTEMS:  A 10+ POINT REVIEW OF SYSTEMS WAS OBTAINED including neurology, dermatology, psychiatry,  cardiac, respiratory, lymph, extremities, GI, GU, musculoskeletal, constitutional, reproductive, HEENT.  She denies any vaginal discharge or bleeding at this time.  She denies any pelvic pain.     PHYSICAL EXAM:  height is 5\' 2"  (1.575 m) and weight is 204 lb 8 oz (92.8 kg). Her temporal temperature is 96.8 F (36 C) (abnormal). Her blood pressure is 139/77 and her pulse is 81. Her respiration is 18 and oxygen saturation is 100%.   General: Alert and oriented, in no acute distress, lying on the examination table most of the time for the evaluation in light of significant fatigue HEENT: Head is normocephalic. Extraocular movements are intact.  Neck: Neck is supple, no palpable cervical or supraclavicular lymphadenopathy. Heart: Regular in rate and rhythm with no murmurs, rubs, or gallops. Chest: Clear to auscultation bilaterally, with no rhonchi, wheezes, or rales. Abdomen: Soft, nontender, nondistended, with no rigidity or guarding. Extremities: No cyanosis or edema. Lymphatics: see Neck Exam Skin: No concerning lesions. Musculoskeletal: symmetric strength and muscle tone throughout. Neurologic: Cranial nerves II through XII are grossly intact. No obvious focalities. Speech is fluent. Coordination is intact. Psychiatric: Judgment and insight are intact. Affect is appropriate. Pelvic exam shows normal external genitalia.  A speculum exam is performed.  There are no mucosal lesions noted in the vaginal vault.   No lesions noted along the vaginal cuff.  On bimanual examination the vaginal cuff is intact.  No palpable mass on bimanual examination.  ECOG = 3  0 - Asymptomatic (Fully active, able to carry on all predisease activities without restriction)  1 - Symptomatic but completely ambulatory (Restricted in physically strenuous activity but ambulatory and able to carry out work of a light or sedentary nature. For example, light housework, office work)  2 - Symptomatic, <50% in bed during the day (Ambulatory and capable of all self care but unable to carry out any work activities. Up and about more than 50% of waking hours)  3 - Symptomatic, >50% in bed, but not bedbound (Capable of only limited self-care, confined to bed or chair 50% or more of waking hours)  4 - Bedbound (Completely disabled. Cannot carry on any self-care. Totally confined to bed or chair)  5 - Death   Eustace Pen MM, Creech RH, Tormey DC, et al. [phone]). "Toxicity and response criteria of the [facility]". [place] Oncol. 5 (6): 649-55  LABORATORY DATA:  Lab Results  Component Value Date   WBC 4.2 06/14/2021   HGB 9.0 (L) 06/14/2021   HCT 26.9 (L) 06/14/2021   MCV 96.8 06/14/2021   PLT 126 (L) 06/14/2021   NEUTROABS 2.0 06/14/2021   Lab Results  Component Value Date   NA 142 06/14/2021   K 3.9 06/14/2021   CL 108 06/14/2021   CO2 22 06/14/2021   GLUCOSE 94 06/14/2021   CREATININE 1.17 (H) 06/14/2021   CALCIUM 9.0 06/14/2021      RADIOGRAPHY: No results found.     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  This was the recommendation of adjuvant chemotherapy which the patient has recently completed.  It was also recommended the patient be considered for external beam radiation therapy directed at the pelvis given her significant gross pelvic  lymphadenopathy at the time of her surgery.  In addition findings of extension into the cervical region.  In addition vaginal brachytherapy was recommended as a component of her  treatment given the serous histology and cervical region involvement.  I discussed these recommendations with patient and her daughter.  They are both quite upset given her level of performance status at this time and anticipated upcoming daily radiation therapy for several weeks.  I discussed the general course of treatment anticipated side effects and potential long-term toxicities of external beam and vaginal brachytherapy.  I also discussed that if she were to consider least some treatment but not be willing to proceed with recommendations as above then I would strongly recommend she consider vaginal brachytherapy.  This will be less time involvement and potentially less side effects than what would be involved with external beam radiation therapy.  The patient would not like to proceed with radiation therapy until early August given her overall performance level at this time.   PLAN: After further discussion with her family,  the patient will call to let us know of her intensions concerning adjuvant treatment,  that being  external beam and vaginal brachytherapy or just vaginal brachytherapy or possibly no additional adjuvant treatment at this time given her overall performance status.   35 minutes of total time was spent for this patient encounter, including preparation, face-to-face counseling with the patient and coordination of care, physical exam, and documentation of the encounter.   ------------------------------------------------  Blair Promise, PhD, MD  This document serves as a record of services personally performed by Gery Pray, MD. It was created on his behalf by Roney Mans, a trained medical scribe. The creation of this record is based on the scribe's personal observations and the provider's  statements to them. This document has been checked and approved by the attending provider.

## 2021-06-25 ENCOUNTER — Other Ambulatory Visit: Payer: Self-pay

## 2021-06-25 ENCOUNTER — Ambulatory Visit
Admission: RE | Admit: 2021-06-25 | Discharge: 2021-06-25 | Disposition: A | Discharge status: Home / Self Care | Payer: Self-pay | Source: Ambulatory Visit | Attending: Radiation Oncology | Admitting: Radiation Oncology

## 2021-06-25 ENCOUNTER — Encounter: Payer: Self-pay | Admitting: Radiation Oncology

## 2021-06-25 VITALS — BP 139/77 | HR 81 | Temp 96.8°F | Resp 18 | Ht 62.0 in | Wt 204.5 lb

## 2021-06-25 DIAGNOSIS — Z79899 Other long term (current) drug therapy: Secondary | ICD-10-CM | POA: Insufficient documentation

## 2021-06-25 DIAGNOSIS — K219 Gastro-esophageal reflux disease without esophagitis: Secondary | ICD-10-CM | POA: Insufficient documentation

## 2021-06-25 DIAGNOSIS — I2510 Atherosclerotic heart disease of native coronary artery without angina pectoris: Secondary | ICD-10-CM | POA: Insufficient documentation

## 2021-06-25 DIAGNOSIS — I10 Essential (primary) hypertension: Secondary | ICD-10-CM | POA: Insufficient documentation

## 2021-06-25 DIAGNOSIS — C541 Malignant neoplasm of endometrium: Secondary | ICD-10-CM

## 2021-06-25 DIAGNOSIS — T451X5A Adverse effect of antineoplastic and immunosuppressive drugs, initial encounter: Secondary | ICD-10-CM | POA: Insufficient documentation

## 2021-06-25 DIAGNOSIS — Z7982 Long term (current) use of aspirin: Secondary | ICD-10-CM | POA: Insufficient documentation

## 2021-06-25 DIAGNOSIS — D6481 Anemia due to antineoplastic chemotherapy: Secondary | ICD-10-CM | POA: Insufficient documentation

## 2021-06-25 DIAGNOSIS — G620 Drug-induced polyneuropathy: Secondary | ICD-10-CM | POA: Insufficient documentation

## 2021-06-25 DIAGNOSIS — E785 Hyperlipidemia, unspecified: Secondary | ICD-10-CM | POA: Insufficient documentation

## 2021-06-25 DIAGNOSIS — E119 Type 2 diabetes mellitus without complications: Secondary | ICD-10-CM | POA: Insufficient documentation

## 2021-06-25 LAB — POCT GLYCOSYLATED HEMOGLOBIN (HGB A1C): Hemoglobin A1C: 8 % — AB (ref 4.0–5.6)

## 2021-06-25 NOTE — Progress Notes (Signed)
See MD note for nursing evaluation. °

## 2021-06-25 NOTE — Addendum Note (Signed)
Addended by: Trecia Rogers on: 06/25/2021 10:59 AM   Modules accepted: Orders

## 2021-06-27 ENCOUNTER — Other Ambulatory Visit: Payer: Self-pay

## 2021-07-04 ENCOUNTER — Ambulatory Visit: Payer: Self-pay | Admitting: Radiation Oncology

## 2021-07-09 ENCOUNTER — Emergency Department (HOSPITAL_COMMUNITY): Payer: Self-pay

## 2021-07-09 ENCOUNTER — Observation Stay (HOSPITAL_COMMUNITY)
Admission: EM | Admit: 2021-07-09 | Discharge: 2021-07-11 | Disposition: A | Discharge status: Home / Self Care | Payer: Self-pay | Attending: Internal Medicine | Admitting: Internal Medicine

## 2021-07-09 ENCOUNTER — Other Ambulatory Visit: Payer: Self-pay

## 2021-07-09 DIAGNOSIS — I5032 Chronic diastolic (congestive) heart failure: Secondary | ICD-10-CM | POA: Insufficient documentation

## 2021-07-09 DIAGNOSIS — D61810 Antineoplastic chemotherapy induced pancytopenia: Secondary | ICD-10-CM | POA: Insufficient documentation

## 2021-07-09 DIAGNOSIS — Z79899 Other long term (current) drug therapy: Secondary | ICD-10-CM | POA: Insufficient documentation

## 2021-07-09 DIAGNOSIS — E1122 Type 2 diabetes mellitus with diabetic chronic kidney disease: Secondary | ICD-10-CM | POA: Insufficient documentation

## 2021-07-09 DIAGNOSIS — Z7984 Long term (current) use of oral hypoglycemic drugs: Secondary | ICD-10-CM | POA: Insufficient documentation

## 2021-07-09 DIAGNOSIS — I2510 Atherosclerotic heart disease of native coronary artery without angina pectoris: Secondary | ICD-10-CM | POA: Insufficient documentation

## 2021-07-09 DIAGNOSIS — Z7982 Long term (current) use of aspirin: Secondary | ICD-10-CM | POA: Insufficient documentation

## 2021-07-09 DIAGNOSIS — Z87891 Personal history of nicotine dependence: Secondary | ICD-10-CM | POA: Insufficient documentation

## 2021-07-09 DIAGNOSIS — N183 Chronic kidney disease, stage 3 unspecified: Secondary | ICD-10-CM | POA: Insufficient documentation

## 2021-07-09 DIAGNOSIS — Z20822 Contact with and (suspected) exposure to covid-19: Secondary | ICD-10-CM | POA: Insufficient documentation

## 2021-07-09 DIAGNOSIS — Z9221 Personal history of antineoplastic chemotherapy: Secondary | ICD-10-CM | POA: Insufficient documentation

## 2021-07-09 DIAGNOSIS — Z794 Long term (current) use of insulin: Secondary | ICD-10-CM | POA: Insufficient documentation

## 2021-07-09 DIAGNOSIS — I130 Hypertensive heart and chronic kidney disease with heart failure and stage 1 through stage 4 chronic kidney disease, or unspecified chronic kidney disease: Secondary | ICD-10-CM | POA: Insufficient documentation

## 2021-07-09 DIAGNOSIS — R2243 Localized swelling, mass and lump, lower limb, bilateral: Secondary | ICD-10-CM | POA: Insufficient documentation

## 2021-07-09 DIAGNOSIS — C549 Malignant neoplasm of corpus uteri, unspecified: Secondary | ICD-10-CM | POA: Insufficient documentation

## 2021-07-09 DIAGNOSIS — R079 Chest pain, unspecified: Principal | ICD-10-CM | POA: Insufficient documentation

## 2021-07-09 LAB — BASIC METABOLIC PANEL
Anion gap: 9 (ref 5–15)
BUN: 14 mg/dL (ref 8–23)
CO2: 23 mmol/L (ref 22–32)
Calcium: 9.3 mg/dL (ref 8.9–10.3)
Chloride: 107 mmol/L (ref 98–111)
Creatinine, Ser: 1.13 mg/dL — ABNORMAL HIGH (ref 0.44–1.00)
GFR, Estimated: 55 mL/min — ABNORMAL LOW (ref >60)
Glucose, Bld: 219 mg/dL — ABNORMAL HIGH (ref 70–99)
Potassium: 3.3 mmol/L — ABNORMAL LOW (ref 3.5–5.1)
Sodium: 139 mmol/L (ref 135–145)

## 2021-07-09 LAB — CBC
HCT: 23.8 % — ABNORMAL LOW (ref 36.0–46.0)
Hemoglobin: 7.9 g/dL — ABNORMAL LOW (ref 12.0–15.0)
MCH: 33.3 pg (ref 26.0–34.0)
MCHC: 33.2 g/dL (ref 30.0–36.0)
MCV: 100.4 fL — ABNORMAL HIGH (ref 80.0–100.0)
Platelets: 61 10*3/uL — ABNORMAL LOW (ref 150–400)
RBC: 2.37 MIL/uL — ABNORMAL LOW (ref 3.87–5.11)
RDW: 16 % — ABNORMAL HIGH (ref 11.5–15.5)
WBC: 3.9 10*3/uL — ABNORMAL LOW (ref 4.0–10.5)
nRBC: 0 % (ref 0.0–0.2)

## 2021-07-09 LAB — TROPONIN I (HIGH SENSITIVITY): Troponin I (High Sensitivity): 13 ng/L (ref <18)

## 2021-07-09 MED ORDER — NITROGLYCERIN 0.4 MG SL SUBL
0.4000 mg | SUBLINGUAL_TABLET | Freq: Once | SUBLINGUAL | Status: AC
  Administered 2021-07-09: 0.4 mg via SUBLINGUAL
  Filled 2021-07-09: qty 1

## 2021-07-09 NOTE — ED Provider Notes (Signed)
[facility] EMERGENCY DEPARTMENT Provider Note   CSN: [id number] Arrival date & time: 07/09/21  2234     History Chief Complaint  Patient presents with   Chest Pain    [name] is a 65 y.o. female.  Patient with history of CAD with stents, diabetes, recent diagnosis of uterine cancer here with chest pain and shortness of breath.  States she was having central chest pain and pressure intermittently for the past several days but constant today.  Last for several hours at a time.  Better with rest worse with exertion.  Pain is associated with shortness of breath and nausea but no vomiting.  Pain radiates to her throat and her neck.  Feels similar to her anginal type pain.  She denies any cough or fever. EMS gave her nitroglycerin with some relief. Finished 18 weeks of chemo for uterine cancer on 6/25. No radiation yet. Pain somewhat worse with exertion and ambulation. Leg swelling at baseline. No history of VTE.  The history is provided by the patient.  Chest Pain Associated symptoms: shortness of breath   Associated symptoms: no abdominal pain, no dizziness, no fatigue, no fever, no headache, no nausea, no vomiting and no weakness       Past Medical History:  Diagnosis Date   Anginal pain ([place]) 2016   Cancer ([place])    Coronary atherosclerosis of native coronary artery, with stent to LCX in 2006 06/04/2013   Diabetes mellitus without complication (HCC)    GERD (gastroesophageal reflux disease)    Heart attack (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[place])    Diabetes mellitus type 2 in obese ([place]) 06/25/2019   Angina pectoris ([place]) 06/19/2019   Viral gastroenteritis 06/19/2019   AKI (acute kidney injury) ([place]) 06/19/2019   Coronary artery disease involving native coronary artery of native heart with angina pectoris ([place])    Osteoarthritis of both hands 05/01/2016   Chronic diastolic heart failure ([place]) 12/06/2015   Non-compliance with treatment-(cost) 08/24/2015   Hypertensive cardiovascular disease 08/24/2015   Dyslipidemia 08/24/2015   CAD -S/P RCA DES 08/22/15    NSTEMI (non-ST elevated myocardial infarction) ([place]) 08/18/2015   Morbid obesity ([place]) 06/15/2013   Type 2 diabetes mellitus, uncontrolled ([place]) 06/04/2013   Essential hypertension 06/04/2013    Past Surgical History:  Procedure Laterality Date   CARDIAC CATHETERIZATION N/A 08/22/2015   Procedure: Left Heart Cath and Coronary Angiography;  Surgeon: Leonie Man, MD;  Location: [place] CV LAB;  Service: Cardiovascular;  Laterality: N/A;   CARDIAC CATHETERIZATION N/A 08/22/2015   Procedure: Coronary Stent Intervention;  Surgeon: Leonie Man, MD;  Location: [place] CV LAB;  Service: Cardiovascular;  Laterality: N/A;   CARDIAC CATHETERIZATION N/A 08/22/2015   Procedure: Left Heart Cath and Coronary Angiography;  Surgeon: Leonie Man, MD;  Location: [place] CV LAB;  Service: Cardiovascular;  Laterality: N/A;   CORONARY STENT PLACEMENT  2006   TAXUS stent CFX in [facility]   IR IMAGING GUIDED PORT INSERTION  02/20/2021   LEFT HEART CATH AND CORONARY ANGIOGRAPHY N/A 07/21/2018   Procedure: LEFT HEART CATH AND CORONARY ANGIOGRAPHY;  Surgeon: Troy Sine, MD;  Location: [place] CV LAB;  Service: Cardiovascular;  Laterality: N/A;   LEFT HEART CATHETERIZATION WITH CORONARY ANGIOGRAM N/A 06/07/2013   Procedure: LEFT HEART  CATHETERIZATION WITH CORONARY ANGIOGRAM;  Surgeon: Sanda Klein, MD;  Location: [place] CATH LAB;  Service: Cardiovascular;  Laterality: N/A;   LYMPH NODE DISSECTION N/A 01/29/2021   Procedure: SELECTIVE LYMPH NODE DISSECTION;  Surgeon: Lafonda Mosses, MD;  Location: [facility];  Service: Gynecology;  Laterality: N/A;   ROBOTIC ASSISTED TOTAL HYSTERECTOMY WITH BILATERAL SALPINGO OOPHERECTOMY Bilateral 01/29/2021   Procedure: XI ROBOTIC ASSISTED TOTAL HYSTERECTOMY WITH BILATERAL SALPINGO OOPHORECTOMY,;  Surgeon: Lafonda Mosses, MD;  Location: [facility];  Service: Gynecology;  Laterality: Bilateral;   SENTINEL NODE BIOPSY N/A 01/29/2021   Procedure: SENTINEL NODE BIOPSY;  Surgeon: Lafonda Mosses, MD;  Location: [facility];  Service: Gynecology;  Laterality: N/A;     OB History     Gravida  2   Para  1   Term  1   Preterm  0   AB  1   Living  1      SAB  0   IAB  1   Ectopic  0   Multiple  0   Live Births  1           Family History  Problem Relation Age of Onset   Diabetes Mother    Hypertension Mother    Diabetes Sister    Diabetes Brother    Hypertension Brother    Colon cancer Father    Breast cancer Neg Hx    Ovarian cancer Neg Hx    Endometrial cancer Neg Hx    Prostate cancer Neg Hx    Pancreatic cancer Neg Hx     Social History   Tobacco Use   Smoking status: Former    Types: Cigars    Quit date: 06/23/2004    Years since quitting: 17.0   Smokeless tobacco: Never   Tobacco comments:    "a few black and milds a day" CigrettesX 30 years  Vaping Use   Vaping Use: Never used  Substance Use Topics   Alcohol use: Yes    Comment: occ   Drug use: No    Home Medications Prior to Admission medications   Medication Sig Start Date End Date Taking? Authorizing Provider  acetaminophen (TYLENOL) 500 MG tablet Take 1 tablet (500 mg total) by mouth every 6 (six) hours as needed. 12/31/19   Rodell Perna A, PA-C  aspirin EC 81 MG EC tablet Take 1 tablet (81 mg  total) by mouth daily. 08/24/15   Erlene Quan, PA-C  atorvastatin (LIPITOR) 80 MG tablet TAKE 1 TABLET (80 MG TOTAL) BY MOUTH AT BEDTIME. 06/20/21 06/20/22  Mayers, Cari S, PA-C  bismuth subsalicylate (PEPTO BISMOL) 262 MG/15ML suspension Take 30 mLs by mouth every 6 (six) hours as needed for indigestion or diarrhea or loose stools.    [provider]  clopidogrel (PLAVIX) 75 MG tablet Take 1 tablet by mouth daily with breakfast 06/20/21 06/20/22  Mayers, Cari S, PA-C  dexamethasone (DECADRON) 4 MG tablet TAKE 2 TABLETS BY MOUTH THE NIGHT BEFORE CHEMOTHERAPY, EVERY 3 WEEKS, X 6 CYCLES Patient not taking: Reported on 06/25/2021 02/13/21 02/13/22  Heath Lark, MD  doxazosin (CARDURA) 2 MG tablet TAKE 1 TABLET BY MOUTH DAILY. Patient not taking: Reported on 06/25/2021 05/31/21 05/31/22  Heath Lark, MD  Dulaglutide (TRULICITY) 1.5 0000000 SOPN Inject 1.5 mg into the skin once a week. 06/20/21   Mayers, Cari S, PA-C  furosemide (LASIX) 20 MG tablet Take 2 tablets (40 mg total) by mouth daily. 06/20/21 07/20/21  Mayers, Cari S, PA-C  glimepiride (AMARYL) 4 MG tablet TAKE 2 TABLETS (8 MG TOTAL) BY MOUTH DAILY WITH BREAKFAST. 06/20/21 06/20/22  Mayers, Cari S, PA-C  Insulin Pen Needle (TRUEPLUS PEN NEEDLES) 32G X 4 MM MISC Use as directed to inject victoza once daily 06/20/21   Mayers, Cari S, PA-C  isosorbide mononitrate (IMDUR) 30 MG 24 hr tablet Take 2 tablets (60 mg total) by mouth daily. 06/20/21 07/20/21  Mayers, Cari S, PA-C  lidocaine-prilocaine (EMLA) cream APPLY TO AFFECTED AREA ONCE AS DIRECTED 02/13/21 02/13/22  Heath Lark, MD  lisinopril (ZESTRIL) 20 MG tablet TAKE 1 TABLET BY MOUTH ONCE A DAY 06/20/21 06/20/22  Mayers, Cari S, PA-C  metFORMIN (GLUCOPHAGE) 500 MG tablet Take 2 tablets (1,000 mg total) by mouth 2 (two) times daily with a meal. 06/20/21 07/20/21  Mayers, Cari S, PA-C  metoprolol tartrate (LOPRESSOR) 100 MG tablet TAKE 1 TABLET (100 MG TOTAL) BY MOUTH 2 (TWO) TIMES DAILY. 06/20/21 06/20/22  Mayers, Cari S, PA-C   nitroGLYCERIN (NITROSTAT) 0.4 MG SL tablet Place 1 tablet (0.4 mg total) under the tongue every 5 (five) minutes as needed for chest pain. 08/24/15   Kerin Ransom K, PA-C  ondansetron (ZOFRAN) 8 MG tablet TAKE 1 TABLET BY MOUTH EVERY 8 HOURS AS NEEDED. Patient not taking: Reported on 06/25/2021 02/13/21 02/13/22  Heath Lark, MD  pantoprazole (PROTONIX) 40 MG tablet TAKE 1 TABLET (40 MG TOTAL) BY MOUTH DAILY. 01/08/21 01/08/22  [name], MD  prochlorperazine (COMPAZINE) 10 MG tablet TAKE 1 TABLET BY MOUTH EVERY 6 HOURS AS NEEDED (NAUSEA OR VOMITING). Patient not taking: Reported on 06/25/2021 02/13/21 02/13/22  Heath Lark, MD  senna-docusate (SENOKOT-S) 8.6-50 MG tablet TAKE 2 TABLETS BY MOUTH AT BEDTIME. FOR AFTER SURGERY, DO NOT TAKE IF HAVING DIARRHEA Patient not taking: No sig reported 01/16/21 01/16/22  Joylene John D, NP  traMADol (ULTRAM) 50 MG tablet Take 1 tablet (50 mg total) by mouth every 6 (six) hours as needed. 04/13/21   Heath Lark, MD    Allergies    Patient has no known allergies.  Review of Systems   Review of Systems  Constitutional:  Negative for activity change, appetite change, fatigue and fever.  HENT:  Negative for dental problem and rhinorrhea.   Respiratory:  Positive for chest tightness and shortness of breath.   Cardiovascular:  Positive for chest pain.  Gastrointestinal:  Negative for abdominal pain, nausea and vomiting.  Genitourinary:  Negative for dysuria and hematuria.  Musculoskeletal:  Negative for arthralgias and myalgias.  Skin:  Negative for rash.  Neurological:  Negative for dizziness, weakness and headaches.   all other systems are negative except as noted in the HPI and PMH.   Physical Exam Updated Vital Signs BP (!) 149/79 (BP Location: Right Arm)   Pulse (!) 119   Temp 98.3 F (36.8 C) (Oral)   Resp (!) 26   Ht '5\' 2"'$  (1.575 m)   Wt 92.5 kg   SpO2 100%   BMI 37.31 kg/m   Physical Exam Vitals and nursing note reviewed.  Constitutional:  General: She is not in acute distress.    Appearance: She is well-developed.  HENT:     Head: Normocephalic and atraumatic.     Mouth/Throat:     Pharynx: No oropharyngeal exudate.  Eyes:     Conjunctiva/sclera: Conjunctivae normal.     Pupils: Pupils are equal, round, and reactive to light.  Neck:     Comments: No meningismus. Cardiovascular:     Rate and Rhythm: Regular rhythm. Tachycardia present.     Heart sounds: Normal heart sounds. No murmur heard. Pulmonary:     Effort: Pulmonary effort is normal. No respiratory distress.     Breath sounds: Normal breath sounds.  Chest:     Chest wall: No tenderness.  Abdominal:     Palpations: Abdomen is soft.     Tenderness: There is no abdominal tenderness. There is no guarding or rebound.  Musculoskeletal:        General: No tenderness. Normal range of motion.     Cervical back: Normal range of motion and neck supple.     Right lower leg: Edema present.     Left lower leg: Edema present.  Skin:    General: Skin is warm.  Neurological:     Mental Status: She is alert and oriented to person, place, and time.     Cranial Nerves: No cranial nerve deficit.     Motor: No abnormal muscle tone.     Coordination: Coordination normal.     Comments:  5/5 strength throughout. CN 2-12 intact.Equal grip strength.   Psychiatric:        Behavior: Behavior normal.    ED Results / Procedures / Treatments   Labs (all labs ordered are listed, but only abnormal results are displayed) Labs Reviewed  BASIC METABOLIC PANEL - Abnormal; Notable for the following components:      Result Value   Potassium 3.3 (*)    Glucose, Bld 219 (*)    Creatinine, Ser 1.13 (*)    GFR, Estimated 55 (*)    All other components within normal limits  CBC - Abnormal; Notable for the following components:   WBC 3.9 (*)    RBC 2.37 (*)    Hemoglobin 7.9 (*)    HCT 23.8 (*)    MCV 100.4 (*)    RDW 16.0 (*)    Platelets 61 (*)    All other components within  normal limits  D-DIMER, QUANTITATIVE (NOT AT [facility]) - Abnormal; Notable for the following components:   D-Dimer, Quant 1.99 (*)    All other components within normal limits  RESP PANEL BY RT-PCR (FLU A&B, COVID) [id number]  TROPONIN I (HIGH SENSITIVITY)  TROPONIN I (HIGH SENSITIVITY)    EKG EKG Interpretation  Date/Time:  Monday July 09 2021 23:37:01 EDT Ventricular Rate:  93 PR Interval:  121 QRS Duration: 82 QT Interval:  462 QTC Calculation: 575 R Axis:   65 Text Interpretation: Sinus rhythm Borderline repolarization abnormality Prolonged QT interval Nonspecific T wave abnormality No significant change was found Confirmed by Ezequiel Essex [phone]) on 07/09/2021 11:45:19 PM  Radiology DG Chest 2 View  Result Date: 07/09/2021 CLINICAL DATA:  65 year old female with chest pain. EXAM: CHEST - 2 VIEW COMPARISON:  Chest radiograph dated 11/21/2020. FINDINGS: Right-sided Port-A-Cath with tip over central SVC. There is mild eventration of the right hemidiaphragm. No focal consolidation, pleural effusion, or pneumothorax. Cardiac silhouette is within limits. Atherosclerotic calcification of the aorta. Degenerative changes of the spine. No acute osseous pathology. IMPRESSION:  No active cardiopulmonary disease. Electronically Signed   By: Anner Crete M.D.   On: 07/09/2021 23:17    Procedures Procedures   Medications Ordered in ED Medications - No data to display  ED Course  I have reviewed the triage vital signs and the nursing notes.  Pertinent labs & imaging results that were available during my care of the patient were reviewed by me and considered in my medical decision making (see chart for details).    MDM Rules/Calculators/A&P                          Central chest pain with shortness of breath intermittent for the past day but fairly constant.  Improved with nitroglycerin.  She is tachycardic and tachypneic  EKG without acute ischemia.  Last catheterization showed patent  stent with medically managed disease T waves laterally are inverted which are unchanged from previous  Chest pain resolved after nitroglycerin.  Remains chest pain-free.  Troponin is negative.  Heart rate has improved. Hemoglobin down slightly to 7.9 from 9.  Given her history of tachycardia, tachypnea, history of cancer, CT imaging performed to rule out pulmonary embolism.  Previous Results as above with medically managed CAD and patent stents.  Heart score is 4-5. Patient is agreeable to observation admission overnight.  CT PE pending. Admission discussed with Dr. Nevada Crane.     Final Clinical Impression(s) / ED Diagnoses Final diagnoses:  Chest pain, unspecified type    Rx / DC Orders ED Discharge Orders     None        Lavanna Rog, Annie Main, MD 07/10/21 [phone]

## 2021-07-09 NOTE — ED Triage Notes (Signed)
Brought in by [facility] from home, c/o chest pain and shortness of breath with blurry vision.  Uncomfortable pain radiating to throat. Reports not feeling well for the past few days.   ASA 324 and 1 nitro SL given. 18 left ac.    Hx of MI and uterine cancer. Finished chemo 06/09/21.   Has not taken BP meds. Bp 180/88

## 2021-07-10 ENCOUNTER — Emergency Department (HOSPITAL_COMMUNITY): Payer: Self-pay

## 2021-07-10 ENCOUNTER — Encounter (HOSPITAL_COMMUNITY): Payer: Self-pay | Admitting: Internal Medicine

## 2021-07-10 DIAGNOSIS — R079 Chest pain, unspecified: Secondary | ICD-10-CM | POA: Diagnosis present

## 2021-07-10 LAB — CBG MONITORING, ED
Glucose-Capillary: 155 mg/dL — ABNORMAL HIGH (ref 70–99)
Glucose-Capillary: 158 mg/dL — ABNORMAL HIGH (ref 70–99)
Glucose-Capillary: 120 mg/dL — ABNORMAL HIGH (ref 70–99)

## 2021-07-10 LAB — CBC WITH DIFFERENTIAL/PLATELET
Abs Immature Granulocytes: 0.01 10*3/uL (ref 0.00–0.07)
Basophils Absolute: 0 10*3/uL (ref 0.0–0.1)
Basophils Relative: 0 %
Eosinophils Absolute: 0 10*3/uL (ref 0.0–0.5)
Eosinophils Relative: 1 %
HCT: 25.8 % — ABNORMAL LOW (ref 36.0–46.0)
Hemoglobin: 8.7 g/dL — ABNORMAL LOW (ref 12.0–15.0)
Immature Granulocytes: 0 %
Lymphocytes Relative: 43 %
Lymphs Abs: 1.5 10*3/uL (ref 0.7–4.0)
MCH: 32.2 pg (ref 26.0–34.0)
MCHC: 33.7 g/dL (ref 30.0–36.0)
MCV: 95.6 fL (ref 80.0–100.0)
Monocytes Absolute: 0.4 10*3/uL (ref 0.1–1.0)
Monocytes Relative: 10 %
Neutro Abs: 1.6 10*3/uL — ABNORMAL LOW (ref 1.7–7.7)
Neutrophils Relative %: 46 %
Platelets: 57 10*3/uL — ABNORMAL LOW (ref 150–400)
RBC: 2.7 MIL/uL — ABNORMAL LOW (ref 3.87–5.11)
RDW: 18.1 % — ABNORMAL HIGH (ref 11.5–15.5)
WBC: 3.6 10*3/uL — ABNORMAL LOW (ref 4.0–10.5)
nRBC: 0 % (ref 0.0–0.2)

## 2021-07-10 LAB — BASIC METABOLIC PANEL
Anion gap: 9 (ref 5–15)
BUN: 15 mg/dL (ref 8–23)
CO2: 23 mmol/L (ref 22–32)
Calcium: 8.8 mg/dL — ABNORMAL LOW (ref 8.9–10.3)
Chloride: 102 mmol/L (ref 98–111)
Creatinine, Ser: 0.99 mg/dL (ref 0.44–1.00)
GFR, Estimated: >60 mL/min (ref >60)
Glucose, Bld: 176 mg/dL — ABNORMAL HIGH (ref 70–99)
Potassium: 3.3 mmol/L — ABNORMAL LOW (ref 3.5–5.1)
Sodium: 134 mmol/L — ABNORMAL LOW (ref 135–145)

## 2021-07-10 LAB — RESP PANEL BY RT-PCR (FLU A&B, COVID) ARPGX2
Influenza A by PCR: NEGATIVE
Influenza B by PCR: NEGATIVE
SARS Coronavirus 2 by RT PCR: NEGATIVE

## 2021-07-10 LAB — CBC
HCT: 21.4 % — ABNORMAL LOW (ref 36.0–46.0)
Hemoglobin: 7 g/dL — ABNORMAL LOW (ref 12.0–15.0)
MCH: 32.9 pg (ref 26.0–34.0)
MCHC: 32.7 g/dL (ref 30.0–36.0)
MCV: 100.5 fL — ABNORMAL HIGH (ref 80.0–100.0)
Platelets: 55 10*3/uL — ABNORMAL LOW (ref 150–400)
RBC: 2.13 MIL/uL — ABNORMAL LOW (ref 3.87–5.11)
RDW: 16.4 % — ABNORMAL HIGH (ref 11.5–15.5)
WBC: 3.7 10*3/uL — ABNORMAL LOW (ref 4.0–10.5)
nRBC: 0 % (ref 0.0–0.2)

## 2021-07-10 LAB — MAGNESIUM: Magnesium: 1.5 mg/dL — ABNORMAL LOW (ref 1.7–2.4)

## 2021-07-10 LAB — PHOSPHORUS: Phosphorus: 3.2 mg/dL (ref 2.5–4.6)

## 2021-07-10 LAB — HIV ANTIBODY (ROUTINE TESTING W REFLEX): HIV Screen 4th Generation wRfx: NONREACTIVE

## 2021-07-10 LAB — PREPARE RBC (CROSSMATCH)

## 2021-07-10 LAB — D-DIMER, QUANTITATIVE: D-Dimer, Quant: 1.99 ug/mL-FEU — ABNORMAL HIGH (ref 0.00–0.50)

## 2021-07-10 LAB — TROPONIN I (HIGH SENSITIVITY): Troponin I (High Sensitivity): 14 ng/L (ref <18)

## 2021-07-10 LAB — GLUCOSE, CAPILLARY: Glucose-Capillary: 130 mg/dL — ABNORMAL HIGH (ref 70–99)

## 2021-07-10 MED ORDER — PANTOPRAZOLE SODIUM 40 MG PO TBEC
40.0000 mg | DELAYED_RELEASE_TABLET | Freq: Two times a day (BID) | ORAL | Status: DC
  Administered 2021-07-10 – 2021-07-11 (×2): 40 mg via ORAL
  Filled 2021-07-10 (×2): qty 1

## 2021-07-10 MED ORDER — ACETAMINOPHEN 325 MG PO TABS: 650.0000 mg | ORAL_TABLET | ORAL | Status: DC | PRN

## 2021-07-10 MED ORDER — CLOPIDOGREL BISULFATE 75 MG PO TABS: 75.0000 mg | ORAL_TABLET | Freq: Every day | ORAL | Status: DC

## 2021-07-10 MED ORDER — SODIUM CHLORIDE 0.9% IV SOLUTION: Freq: Once | INTRAVENOUS | Status: AC

## 2021-07-10 MED ORDER — MAGNESIUM SULFATE 2 GM/50ML IV SOLN
2.0000 g | Freq: Once | INTRAVENOUS | Status: AC
  Administered 2021-07-10: 2 g via INTRAVENOUS
  Filled 2021-07-10: qty 50

## 2021-07-10 MED ORDER — INSULIN ASPART 100 UNIT/ML IJ SOLN
0.0000 [IU] | Freq: Three times a day (TID) | INTRAMUSCULAR | Status: DC
  Administered 2021-07-10: 2 [IU] via SUBCUTANEOUS
  Administered 2021-07-11: 3 [IU] via SUBCUTANEOUS

## 2021-07-10 MED ORDER — IOHEXOL 350 MG/ML SOLN
50.0000 mL | Freq: Once | INTRAVENOUS | Status: AC | PRN
  Administered 2021-07-10: 50 mL via INTRAVENOUS

## 2021-07-10 MED ORDER — POTASSIUM CHLORIDE CRYS ER 20 MEQ PO TBCR
40.0000 meq | EXTENDED_RELEASE_TABLET | Freq: Once | ORAL | Status: AC
  Administered 2021-07-10: 40 meq via ORAL
  Filled 2021-07-10: qty 2

## 2021-07-10 MED ORDER — ONDANSETRON HCL 4 MG/2ML IJ SOLN: 4.0000 mg | Freq: Four times a day (QID) | INTRAMUSCULAR | Status: DC | PRN

## 2021-07-10 MED ORDER — PANTOPRAZOLE SODIUM 40 MG PO TBEC: 40.0000 mg | DELAYED_RELEASE_TABLET | Freq: Every day | ORAL | Status: DC

## 2021-07-10 MED ORDER — INSULIN ASPART 100 UNIT/ML IJ SOLN: 0.0000 [IU] | Freq: Every day | INTRAMUSCULAR | Status: DC

## 2021-07-10 MED ORDER — PROCHLORPERAZINE EDISYLATE 10 MG/2ML IJ SOLN: 5.0000 mg | Freq: Four times a day (QID) | INTRAMUSCULAR | Status: DC | PRN

## 2021-07-10 MED ORDER — POTASSIUM CHLORIDE 10 MEQ/100ML IV SOLN
10.0000 meq | INTRAVENOUS | Status: DC
  Administered 2021-07-10: 10 meq via INTRAVENOUS
  Filled 2021-07-10: qty 100

## 2021-07-10 MED ORDER — ASPIRIN EC 81 MG PO TBEC: 81.0000 mg | DELAYED_RELEASE_TABLET | Freq: Every day | ORAL | Status: DC

## 2021-07-10 MED ORDER — NITROGLYCERIN 0.4 MG SL SUBL: 0.4000 mg | SUBLINGUAL_TABLET | SUBLINGUAL | Status: DC | PRN

## 2021-07-10 MED ORDER — ATORVASTATIN CALCIUM 80 MG PO TABS
80.0000 mg | ORAL_TABLET | Freq: Every day | ORAL | Status: DC
  Administered 2021-07-10 – 2021-07-11 (×2): 80 mg via ORAL
  Filled 2021-07-10: qty 1
  Filled 2021-07-10: qty 2

## 2021-07-10 MED ORDER — ENOXAPARIN SODIUM 40 MG/0.4ML IJ SOSY
40.0000 mg | PREFILLED_SYRINGE | INTRAMUSCULAR | Status: DC
  Administered 2021-07-10: 40 mg via SUBCUTANEOUS
  Filled 2021-07-10: qty 0.4

## 2021-07-10 MED ORDER — FUROSEMIDE 40 MG PO TABS
40.0000 mg | ORAL_TABLET | Freq: Every day | ORAL | Status: DC
  Administered 2021-07-10 – 2021-07-11 (×2): 40 mg via ORAL
  Filled 2021-07-10: qty 1
  Filled 2021-07-10: qty 2

## 2021-07-10 NOTE — H&P (Addendum)
History and Physical  [name] [id number] DOB: 08/09/1956 DOA: 07/09/2021  Referring physician: Dr. Wyvonnia Dusky, [employer]. PCP: [name], MD  Outpatient Specialists: Cardiology. Patient coming from: Home.  Chief Complaint: Chest pain  HPI: [name] is a 65 y.o. female with medical history significant for coronary artery disease status post PCI with stent placement, type 2 diabetes, uterine cancer, finished chemotherapy about a month ago (will start radiation in August 2022), who presented to [facility] ED with complaints of exertional chest pain, centrally located intermittently over the past 2 days but constant today.  She describes it as a discomfort feeling going from her throat down to the middle of her chest.  9/10 in severity.  She took Pepto-Bismo to alleviate the pain without relief.  This discomfort is similar to previous MI.  Associated with shortness of breath.  Lasting 20-30 minutes.  Worse with minimal movement.  She endorses finishing 18 weeks of chemotherapy for uterine cancer on 06/09/2021.  She received sublingual nitroglycerin in route and in the ED with resolution of her chest pain.  Upon presentation to the ED, she had a CTA chest which ruled out PE.  Troponin negative x 2.  Admitted to the hospitalist team.  ED Course:  Temperature 98.3.  BP 155/70, pulse 84, respiratory rate 15, O2 saturation 100% on room air.  Lab studies remarkable for low serum potassium 3.3, elevated glucose level 219, creatinine 1.13 with GFR 55, pancytopenia with WBC of 3.9, hemoglobin 7.9 and hematocrit of 61,000.  Review of Systems: Review of systems as noted in the HPI. All other systems reviewed and are negative.   Past Medical History:  Diagnosis Date   Anginal pain ([place]) 2016   Cancer [facility])    Coronary atherosclerosis of native coronary artery, with stent to LCX in 2006 06/04/2013   Diabetes mellitus without complication (HCC)    GERD (gastroesophageal reflux disease)    Heart attack  ([place]) 2006   Hyperlipidemia LDL goal < 70 06/07/2013   Hypertension    Obesity    S/P coronary artery stent placement to RCA with PROMUS DES, 06/07/13, residual disease on OM1, OM2 and rPDA 06/07/2013   Past Surgical History:  Procedure Laterality Date   CARDIAC CATHETERIZATION N/A 08/22/2015   Procedure: Left Heart Cath and Coronary Angiography;  Surgeon: Leonie Man, MD;  Location: [place] CV LAB;  Service: Cardiovascular;  Laterality: N/A;   CARDIAC CATHETERIZATION N/A 08/22/2015   Procedure: Coronary Stent Intervention;  Surgeon: Leonie Man, MD;  Location: [place] [facility];  Service: Cardiovascular;  Laterality: N/A;   CARDIAC CATHETERIZATION N/A 08/22/2015   Procedure: Left Heart Cath and Coronary Angiography;  Surgeon: Leonie Man, MD;  Location: [place] [facility];  Service: Cardiovascular;  Laterality: N/A;   CORONARY STENT PLACEMENT  2006   TAXUS stent CFX in [facility]   IR IMAGING GUIDED PORT INSERTION  02/20/2021   LEFT HEART CATH AND CORONARY ANGIOGRAPHY N/A 07/21/2018   Procedure: LEFT HEART CATH AND CORONARY ANGIOGRAPHY;  Surgeon: Troy Sine, MD;  Location: [place] [facility];  Service: Cardiovascular;  Laterality: N/A;   LEFT HEART CATHETERIZATION WITH CORONARY ANGIOGRAM N/A 06/07/2013   Procedure: LEFT HEART CATHETERIZATION WITH CORONARY ANGIOGRAM;  Surgeon: Sanda Klein, MD;  Location: [place] CATH LAB;  Service: Cardiovascular;  Laterality: N/A;   LYMPH NODE DISSECTION N/A 01/29/2021   Procedure: SELECTIVE LYMPH NODE DISSECTION;  Surgeon: Lafonda Mosses, MD;  Location: [facility];  Service: Gynecology;  Laterality: N/A;   ROBOTIC ASSISTED TOTAL HYSTERECTOMY WITH BILATERAL SALPINGO OOPHERECTOMY Bilateral 01/29/2021   Procedure: XI ROBOTIC ASSISTED TOTAL HYSTERECTOMY WITH BILATERAL SALPINGO OOPHORECTOMY,;  Surgeon: Lafonda Mosses, MD;  Location: [facility];  Service: Gynecology;  Laterality: Bilateral;   SENTINEL NODE BIOPSY N/A 01/29/2021   Procedure: SENTINEL NODE  BIOPSY;  Surgeon: Lafonda Mosses, MD;  Location: [facility];  Service: Gynecology;  Laterality: N/A;    Social History:  reports that she quit smoking about 17 years ago. Her smoking use included cigars. She has never used smokeless tobacco. She reports current alcohol use. She reports that she does not use drugs.   No Known Allergies  Family History  Problem Relation Age of Onset   Diabetes Mother    Hypertension Mother    Diabetes Sister    Diabetes Brother    Hypertension Brother    Colon cancer Father    Breast cancer Neg Hx    Ovarian cancer Neg Hx    Endometrial cancer Neg Hx    Prostate cancer Neg Hx    Pancreatic cancer Neg Hx       Prior to Admission medications   Medication Sig Start Date End Date Taking? Authorizing Provider  acetaminophen (TYLENOL) 500 MG tablet Take 1 tablet (500 mg total) by mouth every 6 (six) hours as needed. 12/31/19   Rodell Perna A, PA-C  aspirin EC 81 MG EC tablet Take 1 tablet (81 mg total) by mouth daily. 08/24/15   Erlene Quan, PA-C  atorvastatin (LIPITOR) 80 MG tablet TAKE 1 TABLET (80 MG TOTAL) BY MOUTH AT BEDTIME. 06/20/21 06/20/22  Mayers, Cari S, PA-C  bismuth subsalicylate (PEPTO BISMOL) 262 MG/15ML suspension Take 30 mLs by mouth every 6 (six) hours as needed for indigestion or diarrhea or loose stools.    [provider]  clopidogrel (PLAVIX) 75 MG tablet Take 1 tablet by mouth daily with breakfast 06/20/21 06/20/22  Mayers, Cari S, PA-C  dexamethasone (DECADRON) 4 MG tablet TAKE 2 TABLETS BY MOUTH THE NIGHT BEFORE CHEMOTHERAPY, EVERY 3 WEEKS, X 6 CYCLES Patient not taking: Reported on 06/25/2021 02/13/21 02/13/22  Heath Lark, MD  doxazosin (CARDURA) 2 MG tablet TAKE 1 TABLET BY MOUTH DAILY. Patient not taking: Reported on 06/25/2021 05/31/21 05/31/22  Heath Lark, MD  Dulaglutide (TRULICITY) 1.5 0000000 SOPN Inject 1.5 mg into the skin once a week. 06/20/21   Mayers, Cari S, PA-C  furosemide (LASIX) 20 MG tablet Take 2 tablets (40 mg  total) by mouth daily. 06/20/21 07/20/21  Mayers, Cari S, PA-C  glimepiride (AMARYL) 4 MG tablet TAKE 2 TABLETS (8 MG TOTAL) BY MOUTH DAILY WITH BREAKFAST. 06/20/21 06/20/22  Mayers, Cari S, PA-C  Insulin Pen Needle (TRUEPLUS PEN NEEDLES) 32G X 4 MM MISC Use as directed to inject victoza once daily 06/20/21   Mayers, Cari S, PA-C  isosorbide mononitrate (IMDUR) 30 MG 24 hr tablet Take 2 tablets (60 mg total) by mouth daily. 06/20/21 07/20/21  Mayers, Cari S, PA-C  lidocaine-prilocaine (EMLA) cream APPLY TO AFFECTED AREA ONCE AS DIRECTED 02/13/21 02/13/22  Heath Lark, MD  lisinopril (ZESTRIL) 20 MG tablet TAKE 1 TABLET BY MOUTH ONCE A DAY 06/20/21 06/20/22  Mayers, Cari S, PA-C  metFORMIN (GLUCOPHAGE) 500 MG tablet Take 2 tablets (1,000 mg total) by mouth 2 (two) times daily with a meal. 06/20/21 07/20/21  Mayers, Cari S, PA-C  metoprolol tartrate (LOPRESSOR) 100 MG tablet TAKE 1 TABLET (100 MG TOTAL) BY MOUTH 2 (TWO) TIMES DAILY.  06/20/21 06/20/22  Mayers, Cari S, PA-C  nitroGLYCERIN (NITROSTAT) 0.4 MG SL tablet Place 1 tablet (0.4 mg total) under the tongue every 5 (five) minutes as needed for chest pain. 08/24/15   Kerin Ransom K, PA-C  ondansetron (ZOFRAN) 8 MG tablet TAKE 1 TABLET BY MOUTH EVERY 8 HOURS AS NEEDED. Patient not taking: Reported on 06/25/2021 02/13/21 02/13/22  Heath Lark, MD  pantoprazole (PROTONIX) 40 MG tablet TAKE 1 TABLET (40 MG TOTAL) BY MOUTH DAILY. 01/08/21 01/08/22  [name], MD  prochlorperazine (COMPAZINE) 10 MG tablet TAKE 1 TABLET BY MOUTH EVERY 6 HOURS AS NEEDED (NAUSEA OR VOMITING). Patient not taking: Reported on 06/25/2021 02/13/21 02/13/22  Heath Lark, MD  senna-docusate (SENOKOT-S) 8.6-50 MG tablet TAKE 2 TABLETS BY MOUTH AT BEDTIME. FOR AFTER SURGERY, DO NOT TAKE IF HAVING DIARRHEA Patient not taking: No sig reported 01/16/21 01/16/22  Joylene John D, NP  traMADol (ULTRAM) 50 MG tablet Take 1 tablet (50 mg total) by mouth every 6 (six) hours as needed. 04/13/21   Heath Lark, MD    Physical  Exam: BP (!) 155/70   Pulse 84   Temp 98.3 F (36.8 C) (Oral)   Resp 15   Ht '5\' 2"'$  (1.575 m)   Wt 92.5 kg   SpO2 100%   BMI 37.31 kg/m   General: 65 y.o. year-old female well developed well nourished in no acute distress.  Alert and oriented x3.  Conversational dyspnea noted. Cardiovascular: Regular rate and rhythm with no rubs or gallops.  No thyromegaly or JVD noted.  Trace lower extremity edema. 2/4 pulses in all 4 extremities. Respiratory: Clear to auscultation with no wheezes or rales. Good inspiratory effort. Abdomen: Soft nontender nondistended with normal bowel sounds x4 quadrants. Muskuloskeletal: No cyanosis or clubbing.  Trace edema noted bilaterally in lower extremities. Neuro: CN II-XII intact, strength, sensation, reflexes Skin: No ulcerative lesions noted or rashes Psychiatry: Judgement and insight appear normal. Mood is appropriate for condition and setting          Labs on Admission:  Basic Metabolic Panel: Recent Labs  Lab 07/09/21 2241  NA 139  K 3.3*  CL 107  CO2 23  GLUCOSE 219*  BUN 14  CREATININE 1.13*  CALCIUM 9.3   Liver Function Tests: No results for input(s): AST, ALT, ALKPHOS, BILITOT, PROT, ALBUMIN in the last 168 hours. No results for input(s): LIPASE, AMYLASE in the last 168 hours. No results for input(s): AMMONIA in the last 168 hours. CBC: Recent Labs  Lab 07/09/21 2241  WBC 3.9*  HGB 7.9*  HCT 23.8*  MCV 100.4*  PLT 61*   Cardiac Enzymes: No results for input(s): CKTOTAL, CKMB, CKMBINDEX, TROPONINI in the last 168 hours.  BNP (last 3 results) No results for input(s): BNP in the last 8760 hours.  ProBNP (last 3 results) No results for input(s): PROBNP in the last 8760 hours.  CBG: No results for input(s): GLUCAP in the last 168 hours.  Radiological Exams on Admission: DG Chest 2 View  Result Date: 07/09/2021 CLINICAL DATA:  65 year old female with chest pain. EXAM: CHEST - 2 VIEW COMPARISON:  Chest radiograph dated  11/21/2020. FINDINGS: Right-sided Port-A-Cath with tip over central SVC. There is mild eventration of the right hemidiaphragm. No focal consolidation, pleural effusion, or pneumothorax. Cardiac silhouette is within limits. Atherosclerotic calcification of the aorta. Degenerative changes of the spine. No acute osseous pathology. IMPRESSION: No active cardiopulmonary disease. Electronically Signed   By: Anner Crete M.D.   On: 07/09/2021  23:17    EKG: I independently viewed the EKG done and my findings are as followed: Sinus rhythm rate of 93.  Nonspecific ST-T changes.  QTc 575.  Assessment/Plan Present on Admission:  Chest pain  Active Problems:   Chest pain Atypical chest pain, rule out ACS Presented with atypical chest pain, relieved after sublingual nitro Received a full dose aspirin. Troponin negative x2 No evidence of acute ischemia on twelve-lead EKG. Admitted observation, monitor overnight on telemetry Obtain limited 2D echo, last 2D echo was on 02/19/2021. Consult cardiology in the morning  Coronary artery disease status post stent placement Chest pain has resolved at the time of this visit however conversational dyspnea present. Resume home aspirin, Plavix, Lipitor  Pancytopenia, suspect chemotherapy related WBC 3.9, hemoglobin 7.9, platelet count 61,000 Repeat CBC in the morning, showing drop in all 3 cell lines.  Hemoglobin down to 7.0, will transfuse 1 unit PRBC for symptomatic anemia.  Symptomatic anemia Hemoglobin dropped this morning down to 7.0 Transfuse 1 unit PRBC to maintain hemoglobin greater than 8.0 in the setting of underlying cardiac disease.  QTC prolongation Admission twelve-lead EKG revealed QTC of 575 Optimize magnesium and potassium levels Repeat twelve-lead EKG on 07/11/2021. Avoid QTC prolonging agents  Uterine cancer Recent treatment, finished chemotherapy about a month ago Follow-up with oncology outpatient  Hypokalemia Serum potassium  3.3 Repleted orally and intravenously Repeat level in the morning  Hypomagnesemia Serum magnesium 1.5 Repleted intravenously Repeat level in the morning  Type 2 diabetes with hyperglycemia Last A1c 8.0 on 06/25/21 Start insulin sliding scale Heart carb modified diet  GERD Resume home PPI.   DVT prophylaxis: Subcu Lovenox daily  Code Status: Full code  Family Communication: None at bedside  Disposition Plan: Admit to telemetry cardiac  Consults called: None  Admission status: Observation status   Status is: Observation   Dispo:  Patient From: Home  Planned Disposition: Home, possibly on 07/11/2021.  Medically stable for discharge: No      Kayleen Memos MD [facility] Pager [phone]  If 7PM-7AM, please contact night-coverage [email or web address] Password [facility]  07/10/2021, 2:38 AM

## 2021-07-10 NOTE — ED Notes (Signed)
Dr. Nevada Crane currently on the phone with patient regarding blood transfusion info.

## 2021-07-10 NOTE — ED Notes (Signed)
Hospitalist at bedside at this time 

## 2021-07-10 NOTE — ED Notes (Signed)
Breakfast Order Placed ?

## 2021-07-10 NOTE — ED Notes (Signed)
Pt states no body has talked to her about getting blood transfusion done, would like to speak first with daughter and MD prior to giving consent.

## 2021-07-10 NOTE — ED Notes (Signed)
Report given to [facility] RN [facility]

## 2021-07-10 NOTE — Progress Notes (Signed)
[place] OF CARE NOTE Patient: FABIHA ROUGEAU TOI:[id number]   PCP: [name], MD DOB: 1956-05-26   DOA: 07/09/2021   DOS: 07/10/2021    Patient was admitted by my colleague earlier on 07/10/2021. I have reviewed the H&P as well as assessment and plan and agree with the same. Important changes in the plan are listed below.  Plan of care: Active Problems:   Chest pain Patient reports chest pain starting in her throat radiating down to her epigastrium.  Suspect this is GI in nature.  Troponins are negative.  EKG unremarkable.  Pain currently resolved. At present we will provide Protonix twice daily and monitor response. Continue to monitor on telemetry.  Dyspnea on exertion Patient reports dyspnea on exertion prior to this admission. Suspect this is secondary to her anemia which is progressively worsening. SP 1 PRBC transfusion.  We will recheck CBC.  Pancytopenia. Recent chemotherapy. Suspect this pancytopenia is likely secondary to chemotherapy although per my discussion with oncology they think that it is unusual to have pancytopenia from her chemotherapy thus far. For now we will monitor with repeat CBC. If blood counts does not improve we will involve hematology for formal consultation. Check iron level, TSH, ESR, reticulocyte count, B12, INR and coombs.  Author: Berle Mull, MD [facility] 07/10/2021 5:24 PM   If 7PM-7AM, please contact night-coverage at [email or web address]

## 2021-07-10 NOTE — ED Notes (Signed)
Blood consent obtained; signed and witnessed by this RN.

## 2021-07-10 NOTE — ED Notes (Signed)
Patient transported to CT 

## 2021-07-10 NOTE — ED Notes (Signed)
Tray ordered with service response with clear liquid diet.

## 2021-07-11 ENCOUNTER — Other Ambulatory Visit: Payer: Self-pay

## 2021-07-11 ENCOUNTER — Telehealth: Payer: Self-pay | Admitting: Hematology and Oncology

## 2021-07-11 ENCOUNTER — Encounter: Payer: Self-pay | Admitting: Hematology and Oncology

## 2021-07-11 LAB — CBC WITH DIFFERENTIAL/PLATELET
Abs Immature Granulocytes: 0 10*3/uL (ref 0.00–0.07)
Basophils Absolute: 0 10*3/uL (ref 0.0–0.1)
Basophils Relative: 0 %
Eosinophils Absolute: 0.1 10*3/uL (ref 0.0–0.5)
Eosinophils Relative: 2 %
HCT: 27.1 % — ABNORMAL LOW (ref 36.0–46.0)
Hemoglobin: 8.9 g/dL — ABNORMAL LOW (ref 12.0–15.0)
Immature Granulocytes: 0 %
Lymphocytes Relative: 46 %
Lymphs Abs: 1.4 10*3/uL (ref 0.7–4.0)
MCH: 31.4 pg (ref 26.0–34.0)
MCHC: 32.8 g/dL (ref 30.0–36.0)
MCV: 95.8 fL (ref 80.0–100.0)
Monocytes Absolute: 0.3 10*3/uL (ref 0.1–1.0)
Monocytes Relative: 9 %
Neutro Abs: 1.3 10*3/uL — ABNORMAL LOW (ref 1.7–7.7)
Neutrophils Relative %: 43 %
Platelets: 67 10*3/uL — ABNORMAL LOW (ref 150–400)
RBC: 2.83 MIL/uL — ABNORMAL LOW (ref 3.87–5.11)
RDW: 18.3 % — ABNORMAL HIGH (ref 11.5–15.5)
WBC: 3.1 10*3/uL — ABNORMAL LOW (ref 4.0–10.5)
nRBC: 0 % (ref 0.0–0.2)

## 2021-07-11 LAB — DIRECT ANTIGLOBULIN TEST (NOT AT ARMC)
DAT, IgG: NEGATIVE
DAT, complement: NEGATIVE

## 2021-07-11 LAB — RETICULOCYTES
Immature Retic Fract: 26.7 % — ABNORMAL HIGH (ref 2.3–15.9)
RBC.: 2.8 MIL/uL — ABNORMAL LOW (ref 3.87–5.11)
Retic Count, Absolute: 41.4 10*3/uL (ref 19.0–186.0)
Retic Ct Pct: 1.5 % (ref 0.4–3.1)

## 2021-07-11 LAB — COMPREHENSIVE METABOLIC PANEL
ALT: 18 U/L (ref 0–44)
AST: 19 U/L (ref 15–41)
Albumin: 3.3 g/dL — ABNORMAL LOW (ref 3.5–5.0)
Alkaline Phosphatase: 85 U/L (ref 38–126)
Anion gap: 9 (ref 5–15)
BUN: 13 mg/dL (ref 8–23)
CO2: 24 mmol/L (ref 22–32)
Calcium: 9.4 mg/dL (ref 8.9–10.3)
Chloride: 107 mmol/L (ref 98–111)
Creatinine, Ser: 1.01 mg/dL — ABNORMAL HIGH (ref 0.44–1.00)
GFR, Estimated: >60 mL/min (ref >60)
Glucose, Bld: 117 mg/dL — ABNORMAL HIGH (ref 70–99)
Potassium: 4 mmol/L (ref 3.5–5.1)
Sodium: 140 mmol/L (ref 135–145)
Total Bilirubin: 0.6 mg/dL (ref 0.3–1.2)
Total Protein: 6.5 g/dL (ref 6.5–8.1)

## 2021-07-11 LAB — FERRITIN: Ferritin: 311 ng/mL — ABNORMAL HIGH (ref 11–307)

## 2021-07-11 LAB — SEDIMENTATION RATE: Sed Rate: 72 mm/hr — ABNORMAL HIGH (ref 0–22)

## 2021-07-11 LAB — TYPE AND SCREEN
ABO/RH(D): O POS
Antibody Screen: NEGATIVE
Unit division: 0

## 2021-07-11 LAB — VITAMIN B12: Vitamin B-12: 323 pg/mL (ref 180–914)

## 2021-07-11 LAB — BPAM RBC
Blood Product Expiration Date: 202208212359
ISSUE DATE / TIME: 202207260910
Unit Type and Rh: 5100

## 2021-07-11 LAB — PROTIME-INR
INR: 1.1 (ref 0.8–1.2)
Prothrombin Time: 14 seconds (ref 11.4–15.2)

## 2021-07-11 LAB — GLUCOSE, CAPILLARY
Glucose-Capillary: 120 mg/dL — ABNORMAL HIGH (ref 70–99)
Glucose-Capillary: 232 mg/dL — ABNORMAL HIGH (ref 70–99)

## 2021-07-11 LAB — TSH: TSH: 1.067 u[IU]/mL (ref 0.350–4.500)

## 2021-07-11 LAB — MAGNESIUM: Magnesium: 1.9 mg/dL (ref 1.7–2.4)

## 2021-07-11 LAB — IRON AND TIBC
Iron: 70 ug/dL (ref 28–170)
Saturation Ratios: 24 % (ref 10.4–31.8)
TIBC: 288 ug/dL (ref 250–450)
UIBC: 218 ug/dL

## 2021-07-11 MED ORDER — PANTOPRAZOLE SODIUM 40 MG PO TBEC
DELAYED_RELEASE_TABLET | ORAL | 0 refills | Status: DC
  Filled 2021-07-11: qty 60, 46d supply, fill #0

## 2021-07-11 MED ORDER — CYANOCOBALAMIN 1000 MCG/ML IJ SOLN
1000.0000 ug | Freq: Once | INTRAMUSCULAR | Status: AC
  Administered 2021-07-11: 1000 ug via SUBCUTANEOUS
  Filled 2021-07-11: qty 1

## 2021-07-11 MED ORDER — ASPIRIN 81 MG PO TBEC: 81.0000 mg | DELAYED_RELEASE_TABLET | Freq: Every day | ORAL | 12 refills | Status: DC

## 2021-07-11 MED ORDER — FERROUS SULFATE 325 (65 FE) MG PO TABS
325.0000 mg | ORAL_TABLET | Freq: Every day | ORAL | 3 refills | Status: DC
  Filled 2021-07-11: qty 30, 30d supply, fill #0

## 2021-07-11 MED ORDER — VITAMIN B-12 1000 MCG PO TABS: 1000.0000 ug | ORAL_TABLET | Freq: Every day | ORAL | Status: DC

## 2021-07-11 MED ORDER — CYANOCOBALAMIN 1000 MCG PO TABS
1000.0000 ug | ORAL_TABLET | Freq: Every day | ORAL | 0 refills | Status: DC
  Filled 2021-07-11: qty 30, 30d supply, fill #0

## 2021-07-11 NOTE — Discharge Summary (Signed)
[facility] Discharge Summary   Patient: [name] [id number]  PCP: [name], MD  Date of admission: 07/09/2021   Date of discharge:  07/11/2021     Discharge Diagnoses:  Principal diagnosis Symptomatic chemotherapy induced anemia  Active Problems:   Chest pain  Admitted From: home Disposition:  Home \  Recommendations for Outpatient Follow-up:  PCP: Follow-up with PCP in 2 weeks. Follow-up with hematology in 1 week with repeat CBC Follow-up with cardiology in 1 month. Follow up LABS/TEST:  cbc Start taking aspirin 2 days later on Friday.   Follow-up Information     [name], MD. Schedule an appointment as soon as possible for a visit in 2 week(s).   Specialty: Family Medicine Contact information: [place] [address] [phone]         Croitoru, Dani Gobble, MD. Schedule an appointment as soon as possible for a visit in 1 week(s).   Specialty: Cardiology Contact information: [address] [place] [address] [phone]         Heath Lark, MD. Schedule an appointment as soon as possible for a visit in 1 week(s).   Specialty: Hematology and Oncology Contact information: [place] [address] [phone]                Discharge Instructions     Diet - low sodium heart healthy   Complete by: As directed    Increase activity slowly   Complete by: As directed        Diet recommendation: Regular diet  Activity: The patient is advised to gradually reintroduce usual activities, as tolerated  Discharge Condition: stable  Code Status: Full code   History of present illness: As per the H and P dictated on admission, "[name] is a 65 y.o. female with medical history significant for coronary artery disease status post PCI with stent placement, type 2 diabetes, uterine cancer, finished chemotherapy about a month ago (will start radiation in August 2022),  who presented to [facility] ED with complaints of exertional chest pain, centrally located intermittently over the past 2 days but constant today.  She describes it as a discomfort feeling going from her throat down to the middle of her chest.  9/10 in severity.  She took Pepto-Bismo to alleviate the pain without relief.  This discomfort is similar to previous MI.  Associated with shortness of breath.  Lasting 20-30 minutes.  Worse with minimal movement.  She endorses finishing 18 weeks of chemotherapy for uterine cancer on 06/09/2021.  She received sublingual nitroglycerin in route and in the ED with resolution of her chest pain.  Upon presentation to the ED, she had a CTA chest which ruled out PE.  Troponin negative x 2.  Admitted to the hospitalist team.   ED Course:  Temperature 98.3.  BP 155/70, pulse 84, respiratory rate 15, O2 saturation 100% on room air.  Lab studies remarkable for low serum potassium 3.3, elevated glucose level 219, creatinine 1.13 with GFR 55, pancytopenia with WBC of 3.9, hemoglobin 7.9 and hematocrit of 61,000."  Hospital Course:  Summary of her active problems in the hospital is as following.  Chest pain likely GI origin.  Patient reports chest pain starting in her throat radiating down to her epigastrium.  Suspect this is GI in nature.  Troponins are negative.  EKG unremarkable.  Pain currently resolved. At present we will provide Protonix twice daily.  Follow up with Cardiology recommended  Dyspnea on exertion Patient reports dyspnea on exertion prior to this admission. Suspect this is secondary to her anemia which is progressively worsening. SP 1 PRBC transfusion.  Improving after transfusion.  No hypoxia.  Pancytopenia. Recent chemotherapy. Relative b12 deficiency Suspect this pancytopenia is likely secondary to chemotherapy although per my discussion with oncology they think that it is unusual to have pancytopenia from her chemotherapy thus far. Stable repeat  CBC. Iron level stable.  B12 relatively low.  Will replace.  Obesity:  Placing the pt at risk of poor outcome.  Body mass index is 37.31 kg/m.   Uterine cancer. Completed chemotherapy a month ago. Follow-up with oncology outpatient.  Hypokalemia, hypomagnesemia, prolonged QTC. Potassium magnesium corrected.  No significant events on telemetry.  Diabetes mellitus, uncontrolled with hyperglycemia without any long-term insulin use or complication. Continue home regimen discharge.  Patient was ambulatory without any assistance. On the day of the discharge the patient's vitals were stable, and no other new acute medical condition were reported. The patient was felt safe to be discharge at Home with no therapy needed on discharge.  Consultants: none Procedures: none  DISCHARGE MEDICATION: Allergies as of 07/11/2021   No Known Allergies      Medication List     TAKE these medications    aspirin 81 MG EC tablet Take 1 tablet (81 mg total) by mouth daily. Start taking on: July 13, 2021 What changed: These instructions start on July 13, 2021. If you are unsure what to do until then, ask your doctor or other care provider.   atorvastatin 80 MG tablet Commonly known as: LIPITOR TAKE 1 TABLET (80 MG TOTAL) BY MOUTH AT BEDTIME.   bismuth subsalicylate [id number] suspension Commonly known as: PEPTO BISMOL Take 30 mLs by mouth every 6 (six) hours as needed for indigestion or diarrhea or loose stools.   cyanocobalamin 1000 MCG tablet Take 1 tablet (1,000 mcg total) by mouth daily. Start taking on: July 12, 2021   furosemide 20 MG tablet Commonly known as: LASIX Take 2 tablets (40 mg total) by mouth daily.   glimepiride 4 MG tablet Commonly known as: AMARYL TAKE 2 TABLETS (8 MG TOTAL) BY MOUTH DAILY WITH BREAKFAST.   isosorbide mononitrate 30 MG 24 hr tablet Commonly known as: IMDUR Take 2 tablets (60 mg total) by mouth daily.   lidocaine-prilocaine cream Commonly known  as: EMLA APPLY TO AFFECTED AREA ONCE AS DIRECTED   lisinopril 20 MG tablet Commonly known as: ZESTRIL TAKE 1 TABLET BY MOUTH ONCE A DAY   metFORMIN 500 MG tablet Commonly known as: GLUCOPHAGE Take 2 tablets (1,000 mg total) by mouth 2 (two) times daily with a meal.   metoprolol tartrate 100 MG tablet Commonly known as: LOPRESSOR TAKE 1 TABLET (100 MG TOTAL) BY MOUTH 2 (TWO) TIMES DAILY.   nitroGLYCERIN 0.4 MG SL tablet Commonly known as: NITROSTAT Place 1 tablet (0.4 mg total) under the tongue every 5 (five) minutes as needed for chest pain.   pantoprazole 40 MG tablet Commonly known as: PROTONIX Take 1 tablet (40 mg total) by mouth 2 (two) times daily before a meal for 14 days, THEN 1 tablet (40 mg total) daily. Start taking on: July 11, 2021 What changed: See the new instructions.   TRUEplus Pen Needles 32G X 4 MM Misc Generic drug: Insulin Pen Needle Use as directed to inject victoza once daily   Trulicity 1.5 0000000 Sopn Generic drug: Dulaglutide Inject 1.5 mg into the skin once a week.  ASK your doctor about these medications    acetaminophen 500 MG tablet Commonly known as: TYLENOL Take 1 tablet (500 mg total) by mouth every 6 (six) hours as needed.   dexamethasone 4 MG tablet Commonly known as: DECADRON TAKE 2 TABLETS BY MOUTH THE NIGHT BEFORE CHEMOTHERAPY, EVERY 3 WEEKS, X 6 CYCLES   ondansetron 8 MG tablet Commonly known as: ZOFRAN TAKE 1 TABLET BY MOUTH EVERY 8 HOURS AS NEEDED.   prochlorperazine 10 MG tablet Commonly known as: COMPAZINE TAKE 1 TABLET BY MOUTH EVERY 6 HOURS AS NEEDED (NAUSEA OR VOMITING).   Senna Plus 8.6-50 MG tablet Generic drug: senna-docusate TAKE 2 TABLETS BY MOUTH AT BEDTIME. FOR AFTER SURGERY, DO NOT TAKE IF HAVING DIARRHEA   traMADol 50 MG tablet Commonly known as: ULTRAM Take 1 tablet (50 mg total) by mouth every 6 (six) hours as needed.        Discharge Exam: Filed Weights   07/09/21 2239  Weight: 92.5 kg    Vitals:   07/11/21 0028 07/11/21 0447  BP: 139/65 133/73  Pulse: 88 83  Resp: 18 18  Temp: 97.7 F (36.5 C) 97.9 F (36.6 C)  SpO2: 99% 100%   General: Appear in mild distress, no Rash; Oral Mucosa Clear, moist. no Abnormal Neck Mass Or lumps, Conjunctiva normal  Cardiovascular: S1 and S2 Present, no Murmur, Respiratory: good respiratory effort, Bilateral Air entry present and CTA, no Crackles, no wheezes Abdomen: Bowel Sound present, Soft and no tenderness Extremities: no Pedal edema Neurology: alert and oriented to time, place, and person affect appropriate. no new focal deficit Gait not checked due to patient safety concerns   The results of significant diagnostics from this hospitalization (including imaging, microbiology, ancillary and laboratory) are listed below for reference.    Significant Diagnostic Studies: DG Chest 2 View  Result Date: 07/09/2021 CLINICAL DATA:  65 year old female with chest pain. EXAM: CHEST - 2 VIEW COMPARISON:  Chest radiograph dated 11/21/2020. FINDINGS: Right-sided Port-A-Cath with tip over central SVC. There is mild eventration of the right hemidiaphragm. No focal consolidation, pleural effusion, or pneumothorax. Cardiac silhouette is within limits. Atherosclerotic calcification of the aorta. Degenerative changes of the spine. No acute osseous pathology. IMPRESSION: No active cardiopulmonary disease. Electronically Signed   By: Anner Crete M.D.   On: 07/09/2021 23:17   CT Angio Chest PE W and/or Wo Contrast  Result Date: 07/10/2021 CLINICAL DATA:  Shortness of breath. EXAM: CT ANGIOGRAPHY CHEST WITH CONTRAST TECHNIQUE: Multidetector CT imaging of the chest was performed using the standard protocol during bolus administration of intravenous contrast. Multiplanar CT image reconstructions and MIPs were obtained to evaluate the vascular anatomy. CONTRAST:  48m OMNIPAQUE IOHEXOL 350 MG/ML SOLN COMPARISON:  January 25, 2021 FINDINGS: Cardiovascular:  A right-sided venous Port-A-Cath is in place. There is moderate severity calcification of the aortic arch without evidence of aortic aneurysm. Satisfactory opacification of the pulmonary arteries to the segmental level. No evidence of pulmonary embolism. Normal heart size. A coronary artery stent is in place. No pericardial effusion. Mediastinum/Nodes: No enlarged mediastinal, hilar, or axillary lymph nodes. Thyroid gland, trachea, and esophagus demonstrate no significant findings. Lungs/Pleura: Lungs are clear. No pleural effusion or pneumothorax. Upper Abdomen: Noninflamed diverticula are seen within the visualized portion of the splenic flexure. Musculoskeletal: Degenerative changes seen within the thoracic spine. Review of the MIP images confirms the above findings. IMPRESSION: 1. No evidence of pulmonary embolism or acute cardiopulmonary disease. 2. Coronary artery stent in place. 3. Colonic diverticulosis. Electronically Signed  By: Virgina Norfolk M.D.   On: 07/10/2021 04:08    Microbiology: Recent Results (from the past 240 hour(s))  Resp Panel by RT-PCR (Flu A&B, Covid) Nasopharyngeal Swab     Status: None   Collection Time: 07/10/21  3:00 AM   Specimen: Nasopharyngeal Swab; Nasopharyngeal(NP) swabs in vial transport medium  Result Value Ref Range Status   SARS Coronavirus 2 by RT PCR NEGATIVE NEGATIVE Final    Comment: (NOTE) SARS-CoV-2 target nucleic acids are NOT DETECTED.  The SARS-CoV-2 RNA is generally detectable in upper respiratory specimens during the acute phase of infection. The lowest concentration of SARS-CoV-2 viral copies this assay can detect is 138 copies/mL. A negative result does not preclude SARS-Cov-2 infection and should not be used as the sole basis for treatment or other patient management decisions. A negative result may occur with  improper specimen collection/handling, submission of specimen other than nasopharyngeal swab, presence of viral mutation(s) within  the areas targeted by this assay, and inadequate number of viral copies(<138 copies/mL). A negative result must be combined with clinical observations, patient history, and epidemiological information. The expected result is Negative.  Fact Sheet for Patients:  EntrepreneurPulse.com.au  Fact Sheet for Healthcare Providers:  IncredibleEmployment.be  This test is no t yet approved or cleared by the [place] FDA and  has been authorized for detection and/or diagnosis of SARS-CoV-2 by FDA under an Emergency Use Authorization (EUA). This EUA will remain  in effect (meaning this test can be used) for the duration of the COVID-19 declaration under Section 564(b)(1) of the Act, 21 U.S.C.section 360bbb-3(b)(1), unless the authorization is terminated  or revoked sooner.       Influenza A by PCR NEGATIVE NEGATIVE Final   Influenza B by PCR NEGATIVE NEGATIVE Final    Comment: (NOTE) The Xpert Xpress SARS-CoV-2/FLU/RSV plus assay is intended as an aid in the diagnosis of influenza from Nasopharyngeal swab specimens and should not be used as a sole basis for treatment. Nasal washings and aspirates are unacceptable for Xpert Xpress SARS-CoV-2/FLU/RSV testing.  Fact Sheet for Patients: EntrepreneurPulse.com.au  Fact Sheet for Healthcare Providers: IncredibleEmployment.be  This test is not yet approved or cleared by the [place] FDA and has been authorized for detection and/or diagnosis of SARS-CoV-2 by FDA under an Emergency Use Authorization (EUA). This EUA will remain in effect (meaning this test can be used) for the duration of the COVID-19 declaration under Section 564(b)(1) of the Act, 21 U.S.C. section 360bbb-3(b)(1), unless the authorization is terminated or revoked.  Performed at [facility], [place] [address], [place], [place] [address]      Labs: CBC: Recent Labs  Lab 07/09/21 2241  07/10/21 0310 07/10/21 1427 07/11/21 0545  WBC 3.9* 3.7* 3.6* 3.1*  NEUTROABS  --   --  1.6* 1.3*  HGB 7.9* 7.0* 8.7* 8.9*  HCT 23.8* 21.4* 25.8* 27.1*  MCV 100.4* 100.5* 95.6 95.8  PLT 61* 55* 57* 67*   Basic Metabolic Panel: Recent Labs  Lab 07/09/21 2241 07/10/21 0310 07/10/21 0311 07/11/21 0545  NA 139 134*  --  140  K 3.3* 3.3*  --  4.0  CL 107 102  --  107  CO2 23 23  --  24  GLUCOSE 219* 176*  --  117*  BUN 14 15  --  13  CREATININE 1.13* 0.99  --  1.01*  CALCIUM 9.3 8.8*  --  9.4  MG  --   --  1.5* 1.9  PHOS  --   --  3.2  --    Liver Function Tests: Recent Labs  Lab 07/11/21 0545  AST 19  ALT 18  ALKPHOS 85  BILITOT 0.6  PROT 6.5  ALBUMIN 3.3*   CBG: Recent Labs  Lab 07/10/21 0854 07/10/21 1441 07/10/21 1655 07/10/21 2207 07/11/21 0603  GLUCAP 155* 158* 120* 130* 120*    Time spent: 35 minutes  Signed:  Berle Mull  [facility]  07/11/2021

## 2021-07-11 NOTE — Telephone Encounter (Signed)
Scheduled appt per 7/27 sch msg. Pt's daughter is aware.

## 2021-07-11 NOTE — Progress Notes (Signed)
RN gave pt discharge instructions and she stated understanding, IV has been removed and her new prescriptions have been escribed to home pharmacy. Pt getting dressed and calling her ride.

## 2021-07-12 ENCOUNTER — Telehealth: Payer: Self-pay

## 2021-07-12 NOTE — Telephone Encounter (Signed)
Transition Care Management Unsuccessful Follow-up Telephone Call  Date of discharge and from where:  [facility] on 07/11/2021  Attempts:  1st Attempt  Reason for unsuccessful TCM follow-up call: called pt, daughter answered , DPR signed ,call dropped . Reach pt again no answer  Left voice message to call back .

## 2021-07-13 ENCOUNTER — Telehealth: Payer: Self-pay | Admitting: *Deleted

## 2021-07-13 ENCOUNTER — Other Ambulatory Visit: Payer: Self-pay

## 2021-07-13 ENCOUNTER — Telehealth: Payer: Self-pay

## 2021-07-13 NOTE — Telephone Encounter (Signed)
Transition Care Management Unsuccessful Follow-up Telephone Call   Date of discharge and from where:  [facility] on 07/11/2021   Attempts:  2nd Attempt   Reason for unsuccessful TCM follow-up call:  Left voice message to call back , unable to reach pt at this time.

## 2021-07-13 NOTE — Telephone Encounter (Signed)
CALLED PATIENT'S  DAUGHTER- [name] Harbaugh TO INFORM OF NEW HDR [place] FOR 07-16-21, SPOKE WITH PATIENT'S DAUGHTER- [name] AND SHE IS AWARE OF THESE APPTS.

## 2021-07-13 NOTE — Progress Notes (Signed)
GYN Location of Tumor / Histology: Endometrial   RITTA DOGAN presented with symptoms of:  brownish spotting for a total of 2 months   Biopsies revealed: 01/29/2021    Past/Anticipated interventions by Gyn/Onc surgery, if any:  01/29/2021 Operation: Robotic-assisted laparoscopic total hysterectomy with bilateral salpingoophorectomy, SLN biopsy on the left, selective pelvic LND (external and common iliac on the right), omentectomy   Surgeon: Jeral Pinch MD   Past/Anticipated interventions by medical oncology, if any: Dr Alvy Bimler We discussed some of the risks, benefits, side-effects of carboplatin & Taxol. Treatment is intravenous, every 3 weeks x 6 cycles    Weight changes, if any: denies   Bowel/Bladder complaints, if any: occasional constipation and diarrhea, urinary urgency, urinary incontinence   Nausea/Vomiting, if any: denies   Pain issues, if any:  right knee pain rated 7/10   SAFETY ISSUES: Prior radiation? no Pacemaker/ICD? no Possible current pregnancy? no, hysterectomy Is the patient on methotrexate? no  Other symptoms or concerns: none  Vitals:   07/16/21 1211  BP: (!) 144/71  Pulse: 95  Resp: 20  Temp: (!) 97.4 F (36.3 C)  SpO2: 100%  Weight: 203 lb 12.8 oz (92.4 kg)  Height: '5\' 3"'$  (1.6 m)

## 2021-07-14 NOTE — Progress Notes (Signed)
  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 She will be treated with a 3.0 cm diameter segmented cylinder. This conforms to her anatomy without undue discomfort.  Vaginal brachytherapy procedure node: The patient was brought to the [place] suite. Identity was confirmed. All relevant records and images related to the planned course of therapy were reviewed. The patient freely provided informed written consent to proceed with treatment after reviewing the details related to the planned course of therapy. The consent form was witnessed and verified by the simulation staff. Then, the patient was set-up in a stable reproducible supine position for radiation therapy. Pelvic exam revealed the vaginal cuff to be intact . The patient's custom vaginal cylinder was placed in the proximal vagina. This was affixed to the CT/MR stabilization plate to prevent slippage. Patient tolerated the placement well.  Verification simulation note:  A fiducial marker was placed within the vaginal cylinder. An AP and lateral film was then obtained through the pelvis area. This documented accurate position of the vaginal cylinder for treatment.  HDR BRACHYTHERAPY TREATMENT  The remote afterloading device was affixed to the vaginal cylinder by catheter. Patient then proceeded to undergo her first high-dose-rate treatment directed at the proximal vagina. The patient was prescribed a dose of 6 gray to be delivered to the mucosal surface. Treatment length was 3.0 cm. Patient was treated with 1 channel using 7 dwell  positions. Treatment time was 208.5 seconds. Iridium 192 was the high-dose-rate source for treatment. The patient tolerated the treatment well. After completion of her therapy, a radiation survey was performed documenting return of the iridium source into the [employer] safe.   PLAN: The patient will return in a few days for her second high dose rate treatment.  ________________________________  -----------------------------------  Blair Promise, PhD, MD  This document serves as a record of services personally performed by Gery Pray, MD. It was created on his behalf by Roney Mans, a trained medical scribe. The creation of this record is based on the scribe's personal observations and the provider's statements to them. This document has been checked and approved by the attending provider.

## 2021-07-14 NOTE — Progress Notes (Signed)
Radiation Oncology         [phone] ________________________________  Name: [name] MRN: [id number]  Date: 07/16/2021  DOB: 14-May-1956  Vaginal Brachytherapy Procedure Note  CC: [name], MD Lafonda Mosses, MD    ICD-10-CM   1. 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  After careful consideration and discussion with her family patient decided to pursue vaginal brachytherapy alone rather than external beam and intracavitary treatments.  Is feeling stronger at this time.  She denies any vaginal bleeding or pelvic pain.  Of note: the patient had a recent ED encounter on 07/09/21 in which she presented to the [facility] ED with complaints of shortness of breath and exertional intermittent central chest pain, which became constant on the day of her ED visit. She described the pain as a discomfort from her throat down to the middle of her chest; rated at a 9/10 in severity. The patient stated that the discomfort is similar to what she felt during a previous MI. En route to the ED, the patient received sublingual nitroglycerin which resolved her chest pain. CT of chest taken upon admission on 07/10/21 ruled out PE; troponin was negative. She was accordingly admitted for further evaluation. EKG performed during hospital stay showed no evidence of acute ischemia. The patient was discharged home on 07/11/21.   ALLERGIES: has No Known Allergies.  Meds: Current Outpatient Medications  Medication Sig Dispense Refill   acetaminophen (TYLENOL) 500 MG tablet Take 1 tablet (500 mg total) by mouth every 6 (six) hours as needed. (Patient taking differently: Take 500 mg by mouth every 6 (six) hours as needed for mild pain or headache.) 30 tablet 0    aspirin 81 MG EC tablet Take 1 tablet (81 mg total) by mouth daily. 30 tablet 12   atorvastatin (LIPITOR) 80 MG tablet TAKE 1 TABLET (80 MG TOTAL) BY MOUTH AT BEDTIME. 30 tablet 6   Dulaglutide (TRULICITY) 1.5 0000000 SOPN Inject 1.5 mg into the skin once a week. 2 mL 2   ferrous sulfate 325 (65 FE) MG tablet Take 1 tablet (325 mg total) by mouth daily. 30 tablet 3   furosemide (LASIX) 20 MG tablet Take 2 tablets (40 mg total) by mouth daily. 60 tablet 2   glimepiride (AMARYL) 4 MG tablet TAKE 2 TABLETS (8 MG TOTAL) BY MOUTH DAILY WITH BREAKFAST. 60 tablet 6   Insulin Pen Needle (TRUEPLUS PEN NEEDLES) 32G X 4 MM MISC Use as directed to inject victoza once daily 100 each 3   isosorbide mononitrate (IMDUR) 30 MG 24 hr tablet Take 2 tablets (60 mg total) by mouth daily. 60 tablet 2   lidocaine-prilocaine (EMLA) cream APPLY TO AFFECTED AREA ONCE AS DIRECTED 30 g 3   lisinopril (ZESTRIL) 20 MG tablet TAKE 1 TABLET BY MOUTH ONCE A DAY 30 tablet 2   metFORMIN (GLUCOPHAGE) 500 MG tablet Take 2 tablets (1,000 mg total) by mouth 2 (two) times daily with a meal. 120 tablet 2   metoprolol tartrate (LOPRESSOR) 100 MG tablet TAKE 1 TABLET (100 MG TOTAL) BY MOUTH 2 (TWO) TIMES DAILY. 60 tablet 2   nitroGLYCERIN (NITROSTAT) 0.4 MG SL tablet Place 1 tablet (0.4 mg total) under the tongue every 5 (five) minutes as needed for chest pain.  25 tablet 2   pantoprazole (PROTONIX) 40 MG tablet Take 1 tablet (40 mg total) by mouth 2 (two) times daily before a meal for 14 days, THEN 1 tablet (40 mg total) daily. 60 tablet 0   traMADol (ULTRAM) 50 MG tablet Take 1 tablet (50 mg total) by mouth every 6 (six) hours as needed. 30 tablet 0   bismuth subsalicylate (PEPTO BISMOL) 262 MG/15ML suspension Take 30 mLs by mouth every 6 (six) hours as needed for indigestion or diarrhea or loose stools. (Patient not taking: Reported on 07/16/2021)     dexamethasone (DECADRON) 4 MG tablet TAKE 2 TABLETS BY MOUTH THE NIGHT BEFORE CHEMOTHERAPY,  EVERY 3 WEEKS, X 6 CYCLES (Patient not taking: No sig reported) 12 tablet 6   ondansetron (ZOFRAN) 8 MG tablet TAKE 1 TABLET BY MOUTH EVERY 8 HOURS AS NEEDED. (Patient not taking: Reported on 07/16/2021) 30 tablet 1   prochlorperazine (COMPAZINE) 10 MG tablet TAKE 1 TABLET BY MOUTH EVERY 6 HOURS AS NEEDED (NAUSEA OR VOMITING). (Patient not taking: No sig reported) 30 tablet 1   senna-docusate (SENOKOT-S) 8.6-50 MG tablet TAKE 2 TABLETS BY MOUTH AT BEDTIME. FOR AFTER SURGERY, DO NOT TAKE IF HAVING DIARRHEA (Patient not taking: No sig reported) 30 tablet 0   cyanocobalamin 1000 MCG tablet Take 1 tablet (1,000 mcg total) by mouth daily. (Patient not taking: Reported on 07/16/2021) 30 tablet 0   No current facility-administered medications for this encounter.    Physical Findings: The patient is in no acute distress. Patient is alert and oriented.  height is '5\' 3"'$  (1.6 m) and weight is 203 lb 12.8 oz (92.4 kg). Her temperature is 97.4 F (36.3 C) (abnormal). Her blood pressure is 144/71 (abnormal) and her pulse is 95. Her respiration is 20 and oxygen saturation is 100%.   No palpable cervical, supraclavicular or axillary lymphoadenopathy. The heart has a regular rate and rhythm. The lungs are clear to auscultation. Abdomen soft and non-tender.  On pelvic examination the external genitalia were unremarkable. A speculum exam was performed. Vaginal cuff intact, no mucosal lesions. On bimanual exam there were no pelvic masses appreciated.  Lab Findings: Lab Results  Component Value Date   WBC 3.1 (L) 07/11/2021   HGB 8.9 (L) 07/11/2021   HCT 27.1 (L) 07/11/2021   MCV 95.8 07/11/2021   PLT 67 (L) 07/11/2021    Radiographic Findings: DG Chest 2 View  Result Date: 07/09/2021 CLINICAL DATA:  65 year old female with chest pain. EXAM: CHEST - 2 VIEW COMPARISON:  Chest radiograph dated 11/21/2020. FINDINGS: Right-sided Port-A-Cath with tip over central SVC. There is mild eventration of the right  hemidiaphragm. No focal consolidation, pleural effusion, or pneumothorax. Cardiac silhouette is within limits. Atherosclerotic calcification of the aorta. Degenerative changes of the spine. No acute osseous pathology. IMPRESSION: No active cardiopulmonary disease. Electronically Signed   By: Anner Crete M.D.   On: 07/09/2021 23:17   CT Angio Chest PE W and/or Wo Contrast  Result Date: 07/10/2021 CLINICAL DATA:  Shortness of breath. EXAM: CT ANGIOGRAPHY CHEST WITH CONTRAST TECHNIQUE: Multidetector CT imaging of the chest was performed using the standard protocol during bolus administration of intravenous contrast. Multiplanar CT image reconstructions and MIPs were obtained to evaluate the vascular anatomy. CONTRAST:  54m OMNIPAQUE IOHEXOL 350 MG/ML SOLN COMPARISON:  January 25, 2021 FINDINGS: Cardiovascular: A right-sided venous Port-A-Cath is in place. There is moderate severity calcification of the aortic arch without evidence of aortic aneurysm. Satisfactory opacification of the pulmonary arteries to the  segmental level. No evidence of pulmonary embolism. Normal heart size. A coronary artery stent is in place. No pericardial effusion. Mediastinum/Nodes: No enlarged mediastinal, hilar, or axillary lymph nodes. Thyroid gland, trachea, and esophagus demonstrate no significant findings. Lungs/Pleura: Lungs are clear. No pleural effusion or pneumothorax. Upper Abdomen: Noninflamed diverticula are seen within the visualized portion of the splenic flexure. Musculoskeletal: Degenerative changes seen within the thoracic spine. Review of the MIP images confirms the above findings. IMPRESSION: 1. No evidence of pulmonary embolism or acute cardiopulmonary disease. 2. Coronary artery stent in place. 3. Colonic diverticulosis. Electronically Signed   By: Virgina Norfolk M.D.   On: 07/10/2021 04:08    Impression: The encounter diagnosis was Papillary serous adenocarcinoma of endometrium ([place]).  Patient was  fitted for a vaginal cylinder. The patient will be treated with a 3.0 cm diameter cylinder with a treatment length of 3.0 cm. This distended the vaginal vault without undue discomfort. The patient tolerated the procedure well.  The patient was successfully fitted for a vaginal cylinder. The patient is appropriate to begin vaginal brachytherapy.   Plan: The patient will proceed with CT simulation and vaginal brachytherapy today.    _______________________________   Blair Promise, PhD, MD  This document serves as a record of services personally performed by Gery Pray, MD. It was created on his behalf by Roney Mans, a trained medical scribe. The creation of this record is based on the scribe's personal observations and the provider's statements to them. This document has been checked and approved by the attending provider.

## 2021-07-16 ENCOUNTER — Ambulatory Visit: Payer: Self-pay

## 2021-07-16 ENCOUNTER — Ambulatory Visit
Admission: RE | Admit: 2021-07-16 | Discharge: 2021-07-16 | Disposition: A | Discharge status: Home / Self Care | Payer: Self-pay | Source: Ambulatory Visit | Attending: Radiation Oncology | Admitting: Radiation Oncology

## 2021-07-16 ENCOUNTER — Ambulatory Visit: Payer: Self-pay | Admitting: Radiation Oncology

## 2021-07-16 ENCOUNTER — Encounter: Payer: Self-pay | Admitting: Radiation Oncology

## 2021-07-16 ENCOUNTER — Telehealth: Payer: Self-pay

## 2021-07-16 ENCOUNTER — Other Ambulatory Visit: Payer: Self-pay

## 2021-07-16 VITALS — BP 144/71 | HR 95 | Temp 97.4°F | Resp 20 | Ht 63.0 in | Wt 203.8 lb

## 2021-07-16 DIAGNOSIS — Z7982 Long term (current) use of aspirin: Secondary | ICD-10-CM | POA: Insufficient documentation

## 2021-07-16 DIAGNOSIS — C541 Malignant neoplasm of endometrium: Secondary | ICD-10-CM | POA: Insufficient documentation

## 2021-07-16 DIAGNOSIS — K5730 Diverticulosis of large intestine without perforation or abscess without bleeding: Secondary | ICD-10-CM | POA: Insufficient documentation

## 2021-07-16 DIAGNOSIS — M47814 Spondylosis without myelopathy or radiculopathy, thoracic region: Secondary | ICD-10-CM | POA: Insufficient documentation

## 2021-07-16 DIAGNOSIS — C241 Malignant neoplasm of ampulla of Vater: Secondary | ICD-10-CM | POA: Insufficient documentation

## 2021-07-16 DIAGNOSIS — Z794 Long term (current) use of insulin: Secondary | ICD-10-CM | POA: Insufficient documentation

## 2021-07-16 DIAGNOSIS — Z79899 Other long term (current) drug therapy: Secondary | ICD-10-CM | POA: Insufficient documentation

## 2021-07-16 DIAGNOSIS — Z510 Encounter for antineoplastic radiation therapy: Secondary | ICD-10-CM | POA: Insufficient documentation

## 2021-07-16 NOTE — Telephone Encounter (Signed)
Transition Care Management Follow-up Telephone Call Date of discharge and from where: 07/11/2021, [facility]  How have you been since you were released from the hospital? She said she is doing good.  She was getting ready for to go to a radiation appointment and was in a hurry. Instructed her to call this CM back with any questions/concerns. Any questions or concerns? No - none reported at this time   Items Reviewed: Did the pt receive and understand the discharge instructions provided? Yes  Medications obtained and verified? Yes  - she said that she has everything.  Other? No  Any new allergies since your discharge? No  Dietary orders reviewed? No Do you have support at home? Yes   Home Care and Equipment/Supplies: Were home health services ordered? no If so, what is the name of the agency? N/a  Has the agency set up a time to come to the patient's home? not applicable Were any new equipment or medical supplies ordered?  No What is the name of the medical supply agency? N/a Were you able to get the supplies/equipment? not applicable Do you have any questions related to the use of the equipment or supplies? No  Functional Questionnaire: (I = Independent and D = Dependent) ADLs: independent  Follow up appointments reviewed:  PCP Hospital f/u appt confirmed? Yes - [place], [place]  07/19/2021.  [facility] f/u appt confirmed? Yes  Scheduled to see radiation oncology on 07/16/2021   Are transportation arrangements needed?  Not discussed If their condition worsens, is the pt aware to call PCP or go to the Emergency Dept.? Yes Was the patient provided with contact information for the PCP's office or ED? Yes Was to pt encouraged to call back with questions or concerns? Yes

## 2021-07-17 ENCOUNTER — Encounter: Payer: Self-pay | Admitting: Hematology and Oncology

## 2021-07-17 ENCOUNTER — Inpatient Hospital Stay (HOSPITAL_BASED_OUTPATIENT_CLINIC_OR_DEPARTMENT_OTHER): Payer: Self-pay | Admitting: Hematology and Oncology

## 2021-07-17 ENCOUNTER — Inpatient Hospital Stay: Payer: Self-pay | Attending: Gynecologic Oncology

## 2021-07-17 DIAGNOSIS — D61818 Other pancytopenia: Secondary | ICD-10-CM

## 2021-07-17 DIAGNOSIS — C541 Malignant neoplasm of endometrium: Secondary | ICD-10-CM | POA: Insufficient documentation

## 2021-07-17 DIAGNOSIS — R7989 Other specified abnormal findings of blood chemistry: Secondary | ICD-10-CM | POA: Insufficient documentation

## 2021-07-17 DIAGNOSIS — N183 Chronic kidney disease, stage 3 unspecified: Secondary | ICD-10-CM

## 2021-07-17 DIAGNOSIS — E1122 Type 2 diabetes mellitus with diabetic chronic kidney disease: Secondary | ICD-10-CM | POA: Insufficient documentation

## 2021-07-17 DIAGNOSIS — I5032 Chronic diastolic (congestive) heart failure: Secondary | ICD-10-CM | POA: Insufficient documentation

## 2021-07-17 DIAGNOSIS — R5383 Other fatigue: Secondary | ICD-10-CM | POA: Insufficient documentation

## 2021-07-17 DIAGNOSIS — Z79899 Other long term (current) drug therapy: Secondary | ICD-10-CM | POA: Insufficient documentation

## 2021-07-17 DIAGNOSIS — Z452 Encounter for adjustment and management of vascular access device: Secondary | ICD-10-CM | POA: Insufficient documentation

## 2021-07-17 DIAGNOSIS — Z794 Long term (current) use of insulin: Secondary | ICD-10-CM | POA: Insufficient documentation

## 2021-07-17 DIAGNOSIS — Z7982 Long term (current) use of aspirin: Secondary | ICD-10-CM | POA: Insufficient documentation

## 2021-07-17 LAB — CBC WITH DIFFERENTIAL (CANCER CENTER ONLY)
Abs Immature Granulocytes: 0.01 10*3/uL (ref 0.00–0.07)
Basophils Absolute: 0 10*3/uL (ref 0.0–0.1)
Basophils Relative: 0 %
Eosinophils Absolute: 0 10*3/uL (ref 0.0–0.5)
Eosinophils Relative: 1 %
HCT: 25.7 % — ABNORMAL LOW (ref 36.0–46.0)
Hemoglobin: 8.5 g/dL — ABNORMAL LOW (ref 12.0–15.0)
Immature Granulocytes: 0 %
Lymphocytes Relative: 45 %
Lymphs Abs: 1.5 10*3/uL (ref 0.7–4.0)
MCH: 32.1 pg (ref 26.0–34.0)
MCHC: 33.1 g/dL (ref 30.0–36.0)
MCV: 97 fL (ref 80.0–100.0)
Monocytes Absolute: 0.3 10*3/uL (ref 0.1–1.0)
Monocytes Relative: 10 %
Neutro Abs: 1.5 10*3/uL — ABNORMAL LOW (ref 1.7–7.7)
Neutrophils Relative %: 44 %
Platelet Count: 148 10*3/uL — ABNORMAL LOW (ref 150–400)
RBC: 2.65 MIL/uL — ABNORMAL LOW (ref 3.87–5.11)
RDW: 17.6 % — ABNORMAL HIGH (ref 11.5–15.5)
WBC Count: 3.4 10*3/uL — ABNORMAL LOW (ref 4.0–10.5)
nRBC: 0 % (ref 0.0–0.2)

## 2021-07-17 LAB — CMP (CANCER CENTER ONLY)
ALT: 17 U/L (ref 0–44)
AST: 17 U/L (ref 15–41)
Albumin: 3.7 g/dL (ref 3.5–5.0)
Alkaline Phosphatase: 86 U/L (ref 38–126)
Anion gap: 11 (ref 5–15)
BUN: 17 mg/dL (ref 8–23)
CO2: 24 mmol/L (ref 22–32)
Calcium: 9.5 mg/dL (ref 8.9–10.3)
Chloride: 108 mmol/L (ref 98–111)
Creatinine: 1.07 mg/dL — ABNORMAL HIGH (ref 0.44–1.00)
GFR, Estimated: 59 mL/min — ABNORMAL LOW (ref >60)
Glucose, Bld: 138 mg/dL — ABNORMAL HIGH (ref 70–99)
Potassium: 3.6 mmol/L (ref 3.5–5.1)
Sodium: 143 mmol/L (ref 135–145)
Total Bilirubin: 0.5 mg/dL (ref 0.3–1.2)
Total Protein: 7.4 g/dL (ref 6.5–8.1)

## 2021-07-17 NOTE — Progress Notes (Signed)
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[facility]) She is recovering well from recent chemotherapy Pancytopenia is gradually improving Continue supportive care and radiation I will see her again in 3 weeks for further follow-up  Pancytopenia, acquired ([place]) This is due to recent side effects of treatment She is gradually improving She does not need transfusion support  CKD (chronic kidney disease), stage III ([place]) She has intermittent elevated serum creatinine due to diabetes and possibly dehydration We discussed the importance of adequate hydration because her kidney function will impact on her blood counts   No orders of the defined types were placed in this encounter.   All questions were answered. The patient knows to call the clinic with any problems, questions or concerns. The total time spent in the appointment was 20 minutes encounter with patients including review of chart and various tests results, discussions about plan of care and coordination of care plan   Heath Lark, MD 07/17/2021 12:24 PM  INTERVAL HISTORY: Please see below for problem oriented charting. She returns with her daughter for further follow-up She complained of fatigue Overall, she is feeling stronger after release from the hospital No recent bleeding   SUMMARY OF ONCOLOGIC HISTORY: Oncology History Overview Note  Pap 12/14/20: adenocarcinoma, HPV negative  HER-2 positive by FISH Her-2 equivocal by [facility]  MSI stable    Papillary serous adenocarcinoma of endometrium ([place])   Initial Diagnosis   Endometrial carcinoma ([place])   01/12/2021 Initial Biopsy   A. ENDOCERVIX, CURETTAGE:  - Adenocarcinoma.  B. ENDOMETRIUM, BIOPSY:  - Adenocarcinoma.  COMMENT:  The differential diagnosis includes high  grade serous carcinoma.  Both  specimens have a similar morphology; high grade serous carcinoma more  commonly arises in the endometrium.  Results reported to Dr. Hale Bogus  on 01/15/2021.  Dr. Saralyn Pilar reviewed the case.    01/16/2021 Tumor Marker   Patient's tumor was tested for the following markers: CA-125 Results of the tumor marker test revealed normal value, 10.7   01/25/2021 Imaging   CT C/A/P: 1. Small volume fluid in the endometrial canal with 7 mm hypoattenuating lesion in the myometrium of the anterior fundus. 2. No definite evidence for metastatic disease in the chest, abdomen, or pelvis. Small lymph nodes are noted along both pelvic sidewalls. Attention on follow-up recommended. 3. 5 mm ground-glass opacity peripheral left upper lobe. This is likely related to infection/inflammation and potentially scar. Attention on surveillance imaging recommended. 4. 5.8 cm lesion posterior lower uterine segment likely a fibroid. Ultrasound exam from 01/28/2010 demonstrated a 5.2 cm lesion in the left aspect of the posterior lower uterine body. 5. Aortic Atherosclerosis (ICD10-I70.0).   01/29/2021 Surgery   TRH/BSO, SLN biopsy left, selective pelvic LND right, omentectomy  Findings: On EUA, 10-12cm enlarged mobile uterus. On intra-abdomina entry, minimal adhesions between the liver and anterior abdomen on the right. Some changes c/w fatty liver on the left. Omentum, stomach, small and large bowel all grossly normal. Bilateral ovaries normal appearing. Uterus with 5-6cm posterior fibroid, otherwise normal appearing. No mapping to right pelvis. On left, mapping to the level of the superior vessel artery, enlarged lymph node noted (likely sentinel) within the upper aspect of the obturator space. In bilateral pelvic basins, multiple enlarged lymph nodes. On the right, enlarged lymph nodes extended superiorly along the common iliac vessels.  At the end of surgery, no obvious abdominal or pelvic  evidence of disease.   01/29/2021 Pathology Results   A. SENTINEL LYMPH NODE, LEFT INTERNAL ILIAC, BIOPSY:  - Metastatic carcinoma in (1) of (1) lymph node.   B. SENTINEL LYMPH NODE, LEFT OBTURATOR, BIOPSY:  - Metastatic carcinoma in (1) of (1) lymph node.   C. LYMPH NODES, LEFT PELVIC, DISSECTION:  - Metastatic carcinoma in (1) of (3) lymph nodes.   D. LYMPH NODE, RIGHT EXTERNAL AND COMMON ILIAC, BIOPSY:  - Metastatic carcinoma in (1) of (1) lymph node.   E. UTERUS, CERVIX AND BILATERAL FALLOPIAN TUBES AND OVARIES, TOTAL  HYSTERECTOMY AND BILATERAL SALPINGO-OOPHORECTOMY:  - High grade serous carcinoma of endometrium, with invasion more than  half of the myometrium.  - Tumor invades the stromal connective tissue of the cervix.  - No involvement of uterine serosa or adnexa.  - Lymphovascular invasion is identified.  - See oncology table.   F. OMENTUM, OMENTECTOMY:  - Omentum, negative for carcinoma.   G. LYMPH NODES, RIGHT PELVIC, DISSECTION:  - Two lymph nodes, negative for carcinoma (0/2).   ONCOLOGY TABLE:   UTERUS, CARCINOMA OR CARCINOSARCOMA: Resection   Procedure: Total hysterectomy and bilateral salpingo-oophorectomy,  Omentectomy, Lymph node sampling  Histologic Type: Serous carcinoma  Histologic Grade: High grade  Myometrial Invasion: > 50%  Uterine Serosa Involvement: Not identified  Cervical Stroma Involvement: Present  Other Tissue/Organ Involvement: Not identified  Peritoneal/Ascitic Fluid: Negative for carcinoma  Lymphovascular Invasion: Present  Regional Lymph Nodes:       Pelvic Lymph Nodes Examined:            2 Sentinel            6 Non-Sentinel            8 Total       Pelvic Lymph Nodes with Metastasis: 4            Macrometastasis: 3            Micrometastasis: 1            Isolated Tumor Cells: 0            Laterality of Lymph Nodes with Tumor: Right (non-sentinel),  Left (sentinel and non-sentinel)            Extracapsular Extension: Present        Para-Aortic Lymph Nodes Examined: Not applicable        Para-Aortic Lymph Nodes with Metastasis: Not applicable  Distant Metastasis:       Distant Site(s) Involved: Omentum: Not involved  Pathologic Stage Classification (pTNM, AJCC 8th Edition): pT2, pN1a  Ancillary Studies: HER2 will be ordered  Additional Findings: Leiomyomata.  Adenomyosis.  Representative Tumor Block: B1  Comment: Dr. Saralyn Pilar reviewed select slides.  (v4.2.0.1)    01/29/2021 Cancer Staging   Staging form: Corpus Uteri - Carcinoma and Carcinosarcoma, AJCC 8th Edition - Clinical stage from 01/29/2021: FIGO Stage IIIC1 (cT1b, cN1a, cM0) - Signed by Lafonda Mosses, MD on 02/02/2021  Histopathologic type: Mixed cell adenocarcinoma  Stage prefix: Initial diagnosis  Method of lymph node assessment: Other  Histologic grade (G): G3  Histologic grading system: 3 grade system  Lymph-vascular invasion (LVI): LVI present/identified, NOS  Peritoneal cytology results: Negative  Pelvic nodal status: Positive  Number of pelvic nodes positive from dissection: 4  Number of pelvic nodes examined during dissection: 8  Para-aortic status: Not assessed  Lymph node metastasis: Present  Omentectomy performed: Yes  Morcellation performed:  No    02/19/2021 Echocardiogram    1. Left ventricular ejection fraction, by estimation, is 55 to 60%. The left ventricle has normal function. The left ventricle demonstrates regional wall motion abnormalities (see scoring diagram/findings for description). There is mild left ventricular  hypertrophy. Left ventricular diastolic parameters are consistent with Grade I diastolic dysfunction (impaired relaxation). There is moderate hypokinesis of the left ventricular, basal septal wall and inferior wall. The average left ventricular global longitudinal strain is -17.9 %. The global longitudinal strain is abnormal.  2. Right ventricular systolic function is normal. The right ventricular  size is normal. There is normal pulmonary artery systolic pressure. The estimated right ventricular systolic pressure is 71.6 mmHg.  3. The mitral valve is abnormal. Trivial mitral valve regurgitation.  4. The aortic valve is tricuspid. Aortic valve regurgitation is not visualized.  5. The inferior vena cava is normal in size with greater than 50% respiratory variability, suggesting right atrial pressure of 3 mmHg.   02/20/2021 Procedure   Successful placement of a right IJ approach Power Port with ultrasound and fluoroscopic guidance. The catheter is ready for use.   02/28/2021 -  Chemotherapy    Patient is on Treatment Plan: UTERINE CARBOPLATIN AUC 6 / PACLITAXEL Q21D       04/20/2021 Imaging   Status post hysterectomy and suspected bilateral salpingo-oophorectomy.   2.2 cm fluid density lesion along the left pelvic sidewall likely reflects a postoperative seroma.   No findings suspicious for recurrent or metastatic disease.     REVIEW OF SYSTEMS:   Constitutional: Denies fevers, chills or abnormal weight loss Eyes: Denies blurriness of vision Ears, nose, mouth, throat, and face: Denies mucositis or sore throat Respiratory: Denies cough, dyspnea or wheezes Cardiovascular: Denies palpitation, chest discomfort or lower extremity swelling Gastrointestinal:  Denies nausea, heartburn or change in bowel habits Skin: Denies abnormal skin rashes Lymphatics: Denies new lymphadenopathy or easy bruising Neurological:Denies numbness, tingling or new weaknesses Behavioral/Psych: Mood is stable, no new changes  All other systems were reviewed with the patient and are negative.  I have reviewed the past medical history, past surgical history, social history and family history with the patient and they are unchanged from previous note.  ALLERGIES:  has No Known Allergies.  MEDICATIONS:  Current Outpatient Medications  Medication Sig Dispense Refill   acetaminophen (TYLENOL) 500 MG tablet Take  1 tablet (500 mg total) by mouth every 6 (six) hours as needed. (Patient taking differently: Take 500 mg by mouth every 6 (six) hours as needed for mild pain or headache.) 30 tablet 0   aspirin 81 MG EC tablet Take 1 tablet (81 mg total) by mouth daily. 30 tablet 12   atorvastatin (LIPITOR) 80 MG tablet TAKE 1 TABLET (80 MG TOTAL) BY MOUTH AT BEDTIME. 30 tablet 6   bismuth subsalicylate (PEPTO BISMOL) 262 MG/15ML suspension Take 30 mLs by mouth every 6 (six) hours as needed for indigestion or diarrhea or loose stools. (Patient not taking: Reported on 07/16/2021)     Dulaglutide (TRULICITY) 1.5 RC/7.8LF SOPN Inject 1.5 mg into the skin once a week. 2 mL 2   furosemide (LASIX) 20 MG tablet Take 2 tablets (40 mg total) by mouth daily. 60 tablet 2   glimepiride (AMARYL) 4 MG tablet TAKE 2 TABLETS (8 MG TOTAL) BY MOUTH DAILY WITH BREAKFAST. 60 tablet 6   Insulin Pen Needle (TRUEPLUS PEN NEEDLES) 32G X 4 MM MISC Use as directed to inject victoza once daily 100 each 3  isosorbide mononitrate (IMDUR) 30 MG 24 hr tablet Take 2 tablets (60 mg total) by mouth daily. 60 tablet 2   lidocaine-prilocaine (EMLA) cream APPLY TO AFFECTED AREA ONCE AS DIRECTED 30 g 3   lisinopril (ZESTRIL) 20 MG tablet TAKE 1 TABLET BY MOUTH ONCE A DAY 30 tablet 2   metFORMIN (GLUCOPHAGE) 500 MG tablet Take 2 tablets (1,000 mg total) by mouth 2 (two) times daily with a meal. 120 tablet 2   metoprolol tartrate (LOPRESSOR) 100 MG tablet TAKE 1 TABLET (100 MG TOTAL) BY MOUTH 2 (TWO) TIMES DAILY. 60 tablet 2   nitroGLYCERIN (NITROSTAT) 0.4 MG SL tablet Place 1 tablet (0.4 mg total) under the tongue every 5 (five) minutes as needed for chest pain. 25 tablet 2   ondansetron (ZOFRAN) 8 MG tablet TAKE 1 TABLET BY MOUTH EVERY 8 HOURS AS NEEDED. (Patient not taking: Reported on 07/16/2021) 30 tablet 1   pantoprazole (PROTONIX) 40 MG tablet Take 1 tablet (40 mg total) by mouth 2 (two) times daily before a meal for 14 days, THEN 1 tablet (40 mg total)  daily. 60 tablet 0   prochlorperazine (COMPAZINE) 10 MG tablet TAKE 1 TABLET BY MOUTH EVERY 6 HOURS AS NEEDED (NAUSEA OR VOMITING). (Patient not taking: No sig reported) 30 tablet 1   senna-docusate (SENOKOT-S) 8.6-50 MG tablet TAKE 2 TABLETS BY MOUTH AT BEDTIME. FOR AFTER SURGERY, DO NOT TAKE IF HAVING DIARRHEA (Patient not taking: No sig reported) 30 tablet 0   traMADol (ULTRAM) 50 MG tablet Take 1 tablet (50 mg total) by mouth every 6 (six) hours as needed. 30 tablet 0   No current facility-administered medications for this visit.    PHYSICAL EXAMINATION: ECOG PERFORMANCE STATUS: 1 - Symptomatic but completely ambulatory  Vitals:   07/17/21 1025  BP: 138/67  Pulse: (!) 103  Resp: 18  Temp: 98.1 F (36.7 C)  SpO2: 100%   Filed Weights   07/17/21 1025  Weight: 202 lb 9.6 oz (91.9 kg)    GENERAL:alert, no distress and comfortable SKIN: skin color, texture, turgor are normal, no rashes or significant lesions EYES: normal, Conjunctiva are pink and non-injected, sclera clear OROPHARYNX:no exudate, no erythema and lips, buccal mucosa, and tongue normal  NECK: supple, thyroid normal size, non-tender, without nodularity LYMPH:  no palpable lymphadenopathy in the cervical, axillary or inguinal LUNGS: clear to auscultation and percussion with normal breathing effort HEART: regular rate & rhythm and no murmurs and no lower extremity edema ABDOMEN:abdomen soft, non-tender and normal bowel sounds Musculoskeletal:no cyanosis of digits and no clubbing  NEURO: alert & oriented x 3 with fluent speech, no focal motor/sensory deficits  LABORATORY DATA:  I have reviewed the data as listed    Component Value Date/Time   NA 143 07/17/2021 1009   NA 143 10/18/2020 1028   K 3.6 07/17/2021 1009   CL 108 07/17/2021 1009   CO2 24 07/17/2021 1009   GLUCOSE 138 (H) 07/17/2021 1009   BUN 17 07/17/2021 1009   BUN 13 10/18/2020 1028   CREATININE 1.07 (H) 07/17/2021 1009   CREATININE 0.79  02/11/2017 0943   CALCIUM 9.5 07/17/2021 1009   PROT 7.4 07/17/2021 1009   PROT 6.7 10/18/2020 1028   ALBUMIN 3.7 07/17/2021 1009   ALBUMIN 4.1 10/18/2020 1028   AST 17 07/17/2021 1009   ALT 17 07/17/2021 1009   ALKPHOS 86 07/17/2021 1009   BILITOT 0.5 07/17/2021 1009   GFRNONAA 59 (L) 07/17/2021 1009   GFRNONAA 83 02/11/2017 0943  GFRAA 70 10/18/2020 1028   GFRAA >89 02/11/2017 0943    No results found for: SPEP, UPEP  Lab Results  Component Value Date   WBC 3.4 (L) 07/17/2021   NEUTROABS 1.5 (L) 07/17/2021   HGB 8.5 (L) 07/17/2021   HCT 25.7 (L) 07/17/2021   MCV 97.0 07/17/2021   PLT 148 (L) 07/17/2021      Chemistry      Component Value Date/Time   NA 143 07/17/2021 1009   NA 143 10/18/2020 1028   K 3.6 07/17/2021 1009   CL 108 07/17/2021 1009   CO2 24 07/17/2021 1009   BUN 17 07/17/2021 1009   BUN 13 10/18/2020 1028   CREATININE 1.07 (H) 07/17/2021 1009   CREATININE 0.79 02/11/2017 0943      Component Value Date/Time   CALCIUM 9.5 07/17/2021 1009   ALKPHOS 86 07/17/2021 1009   AST 17 07/17/2021 1009   ALT 17 07/17/2021 1009   BILITOT 0.5 07/17/2021 1009

## 2021-07-17 NOTE — Assessment & Plan Note (Signed)
This is due to recent side effects of treatment She is gradually improving She does not need transfusion support

## 2021-07-17 NOTE — Assessment & Plan Note (Signed)
She is recovering well from recent chemotherapy Pancytopenia is gradually improving Continue supportive care and radiation I will see her again in 3 weeks for further follow-up

## 2021-07-17 NOTE — Assessment & Plan Note (Signed)
She has intermittent elevated serum creatinine due to diabetes and possibly dehydration We discussed the importance of adequate hydration because her kidney function will impact on her blood counts

## 2021-07-19 ENCOUNTER — Inpatient Hospital Stay: Payer: Self-pay | Admitting: Physician Assistant

## 2021-07-23 ENCOUNTER — Encounter: Payer: Self-pay | Admitting: Hematology and Oncology

## 2021-07-23 ENCOUNTER — Other Ambulatory Visit: Payer: Self-pay | Admitting: Hematology and Oncology

## 2021-07-23 ENCOUNTER — Other Ambulatory Visit: Payer: Self-pay

## 2021-07-25 ENCOUNTER — Other Ambulatory Visit: Payer: Self-pay

## 2021-07-26 ENCOUNTER — Other Ambulatory Visit: Payer: Self-pay

## 2021-07-26 ENCOUNTER — Other Ambulatory Visit (HOSPITAL_COMMUNITY): Payer: Self-pay

## 2021-07-27 ENCOUNTER — Telehealth: Payer: Self-pay

## 2021-07-27 NOTE — Telephone Encounter (Signed)
I think it was DC when she was admitted She can stop and get PCP to monitor her BP

## 2021-07-27 NOTE — Progress Notes (Signed)
Cardiology Office Note:    Date:  08/10/2021   ID:  [name], DOB [birth year], MRN [id number]  PCP:  [name], MD  Cardiologist:  Sanda Klein, MD   Referring MD: [name], MD   Chief Complaint  Patient presents with   Hospitalization Follow-up    Chest pain    History of Present Illness:    [name] is a 65 y.o. female with a hx of endometrial cancer s/p chemotherapy and hysterectomy, CKD stage III, hypertension, CAD, DM, HTN, chronic diastolic heart failure, obesity and GERD.  She has a history of stents placed in the RCA, distal L Cx, and proximal LAD. She underwent repeat heart cath in August 2019 after an abnormal nuclear stress test. Heart cath showed mild ISR in the previously placed RCA and LAD stents, patent distal L Cx stent followed by 100% occlusion just past L Cx stent with collateral to distal RCA.  She has distal disease treated with medical therapy. She was last seen in clinic by Dr. Sallyanne Kuster 01/23/21 to obtain cardiac clearance for hysterectomy. She was stable at that time and proceeded with surgery. She presented to [facility] 07/09/21 with exertional chest pain and shortness of breath. She ruled out with negative CE and nonischemic EKG. Suspected her symptoms were related to chemotherapy vs GI as pain radiated from her throat to her epigastric area. CTA negative for PE. She was discharged the following day with BID protonix.  She presents today for follow up. She is here with her daughter. She will complete her last radiation treatment next Thursday. I congratulated her on this long journey. Protonix has completely resolved her chest discomfort. She denies SOB, orthopnea, lower extremity swelling, and syncope. She continues to battle a metallic taste with food and drink from chemo. She also complains of intermittent tingling in her hands from chemo. I also discussed her A1c and how this could play a role in neuropathy.    Past Medical History:  Diagnosis  Date   Anginal pain ([place]) 2016   Cancer [facility])    Coronary atherosclerosis of native coronary artery, with stent to LCX in 2006 06/04/2013   Diabetes mellitus without complication (HCC)    GERD (gastroesophageal reflux disease)    Heart attack ([place]) 2006   Hyperlipidemia LDL goal < 70 06/07/2013   Hypertension    Obesity    S/P coronary artery stent placement to RCA with PROMUS DES, 06/07/13, residual disease on OM1, OM2 and rPDA 06/07/2013    Past Surgical History:  Procedure Laterality Date   CARDIAC CATHETERIZATION N/A 08/22/2015   Procedure: Left Heart Cath and Coronary Angiography;  Surgeon: Leonie Man, MD;  Location: [place] CV LAB;  Service: Cardiovascular;  Laterality: N/A;   CARDIAC CATHETERIZATION N/A 08/22/2015   Procedure: Coronary Stent Intervention;  Surgeon: Leonie Man, MD;  Location: [place] CV LAB;  Service: Cardiovascular;  Laterality: N/A;   CARDIAC CATHETERIZATION N/A 08/22/2015   Procedure: Left Heart Cath and Coronary Angiography;  Surgeon: Leonie Man, MD;  Location: [place] [facility] LAB;  Service: Cardiovascular;  Laterality: N/A;   CORONARY STENT PLACEMENT  2006   TAXUS stent CFX in [facility]   IR IMAGING GUIDED PORT INSERTION  02/20/2021   LEFT HEART CATH AND CORONARY ANGIOGRAPHY N/A 07/21/2018   Procedure: LEFT HEART CATH AND CORONARY ANGIOGRAPHY;  Surgeon: Troy Sine, MD;  Location: [place] CV LAB;  Service: Cardiovascular;  Laterality: N/A;   LEFT HEART CATHETERIZATION WITH  CORONARY ANGIOGRAM N/A 06/07/2013   Procedure: LEFT HEART CATHETERIZATION WITH CORONARY ANGIOGRAM;  Surgeon: Sanda Klein, MD;  Location: [place] CATH LAB;  Service: Cardiovascular;  Laterality: N/A;   LYMPH NODE DISSECTION N/A 01/29/2021   Procedure: SELECTIVE LYMPH NODE DISSECTION;  Surgeon: Lafonda Mosses, MD;  Location: [facility];  Service: Gynecology;  Laterality: N/A;   ROBOTIC ASSISTED TOTAL HYSTERECTOMY WITH BILATERAL SALPINGO OOPHERECTOMY Bilateral 01/29/2021    Procedure: XI ROBOTIC ASSISTED TOTAL HYSTERECTOMY WITH BILATERAL SALPINGO OOPHORECTOMY,;  Surgeon: Lafonda Mosses, MD;  Location: [facility];  Service: Gynecology;  Laterality: Bilateral;   SENTINEL NODE BIOPSY N/A 01/29/2021   Procedure: SENTINEL NODE BIOPSY;  Surgeon: Lafonda Mosses, MD;  Location: [facility];  Service: Gynecology;  Laterality: N/A;    Current Medications: Current Meds  Medication Sig   acetaminophen (TYLENOL) 500 MG tablet Take 1 tablet (500 mg total) by mouth every 6 (six) hours as needed. (Patient taking differently: Take 500 mg by mouth every 6 (six) hours as needed for mild pain or headache.)   aspirin 81 MG EC tablet Take 1 tablet (81 mg total) by mouth daily.   atorvastatin (LIPITOR) 80 MG tablet TAKE 1 TABLET (80 MG TOTAL) BY MOUTH AT BEDTIME.   bismuth subsalicylate (PEPTO BISMOL) 262 MG/15ML suspension Take 30 mLs by mouth every 6 (six) hours as needed for indigestion or diarrhea or loose stools.   Dulaglutide (TRULICITY) 1.5 0000000 SOPN Inject 1.5 mg into the skin once a week.   furosemide (LASIX) 20 MG tablet Take 2 tablets (40 mg total) by mouth daily.   glimepiride (AMARYL) 4 MG tablet TAKE 2 TABLETS (8 MG TOTAL) BY MOUTH DAILY WITH BREAKFAST.   isosorbide mononitrate (IMDUR) 30 MG 24 hr tablet Take 2 tablets (60 mg total) by mouth daily.   lisinopril (ZESTRIL) 20 MG tablet TAKE 1 TABLET BY MOUTH ONCE A DAY   metFORMIN (GLUCOPHAGE) 500 MG tablet Take 2 tablets (1,000 mg total) by mouth 2 (two) times daily with a meal.   metoprolol tartrate (LOPRESSOR) 100 MG tablet TAKE 1 TABLET (100 MG TOTAL) BY MOUTH 2 (TWO) TIMES DAILY.   nitroGLYCERIN (NITROSTAT) 0.4 MG SL tablet Place 1 tablet (0.4 mg total) under the tongue every 5 (five) minutes as needed for chest pain.   pantoprazole (PROTONIX) 40 MG tablet Take 1 tablet (40 mg total) by mouth 2 (two) times daily before a meal for 14 days, THEN 1 tablet (40 mg total) daily.   traMADol (ULTRAM) 50 MG tablet Take 1  tablet (50 mg total) by mouth every 6 (six) hours as needed.     Allergies:   Patient has no known allergies.   Social History   Socioeconomic History   Marital status: Divorced    Spouse name: Not on file   Number of children: 1   Years of education: Not on file   Highest education level: Not on file  Occupational History   Occupation: Scientist, water quality  Tobacco Use   Smoking status: Former    Types: Cigars    Quit date: 06/23/2004    Years since quitting: 17.1   Smokeless tobacco: Never   Tobacco comments:    "a few black and milds a day" CigrettesX 30 years  Vaping Use   Vaping Use: Never used  Substance and Sexual Activity   Alcohol use: Yes    Comment: occ   Drug use: No   Sexual activity: Not Currently  Other Topics Concern   Not on file  Social History Narrative   Not on file   Social Determinants of Health   Financial Resource Strain: Not on file  Food Insecurity: No Food Insecurity   Worried About Charity fundraiser in the Last Year: Never true   Ran Out of Food in the Last Year: Never true  Transportation Needs: No Transportation Needs   Lack of Transportation (Medical): No   Lack of Transportation (Non-Medical): No  Physical Activity: Not on file  Stress: Not on file  Social Connections: Not on file     Family History: The patient's family history includes Colon cancer in her father; Diabetes in her brother, mother, and sister; Hypertension in her brother and mother. There is no history of Breast cancer, Ovarian cancer, Endometrial cancer, Prostate cancer, or Pancreatic cancer.  ROS:   Please see the history of present illness.     All other systems reviewed and are negative.  EKGs/Labs/Other Studies Reviewed:    The following studies were reviewed today:  Echo 02/19/21:  1. Left ventricular ejection fraction, by estimation, is 55 to 60%. The  left ventricle has normal function. The left ventricle demonstrates  regional wall motion abnormalities (see  scoring diagram/findings for  description). There is mild left ventricular   hypertrophy. Left ventricular diastolic parameters are consistent with  Grade I diastolic dysfunction (impaired relaxation). There is moderate  hypokinesis of the left ventricular, basal septal wall and inferior wall.  The average left ventricular global  longitudinal strain is -17.9 %. The global longitudinal strain is  abnormal.   2. Right ventricular systolic function is normal. The right ventricular  size is normal. There is normal pulmonary artery systolic pressure. The  estimated right ventricular systolic pressure is 0000000 mmHg.   3. The mitral valve is abnormal. Trivial mitral valve regurgitation.   4. The aortic valve is tricuspid. Aortic valve regurgitation is not  visualized.   5. The inferior vena cava is normal in size with greater than 50%  respiratory variability, suggesting right atrial pressure of 3 mmHg.    Left heart cath 07/21/18: Ost RCA to Mid RCA lesion is 15% stenosed. Ost RPDA to RPDA lesion is 20% stenosed. Ost LAD to Prox LAD lesion is 20% stenosed. Dist LAD lesion is 70% stenosed with 65% stenosed side branch in Ost 3rd Sept. Mid Cx to Dist Cx lesion is 10% stenosed. Dist Cx lesion is 100% stenosed. 1st Mrg lesion is 80% stenosed. 2nd Mrg lesion is 50% stenosed.   Coronary obstructive disease with a widely patent stent in the proximal LAD mild irregularity of the LAD with improved distal LAD stenosis now  65 to 70%; circumflex vessel with small first marginal branch with 80% stenosis, 50% stenosis and a small proximal second marginal branch, 20% mid circumflex narrowing with patent proximal to midportion of the distal circumflex stent with total occlusion at the distal aspect of stent.  There is faint collateralization to the distal circumflex via RCA; and patent proximal to mid RCA stent with mild intimal hyperplasia, and mild irregularity of the RCA with 20% PDA stenosis.   LVEDP 19  mm.   RECOMMENDATION: Review of the old films indicates that the very distal circumflex which is now occluded was a very small caliber diminutive vessel.  As result medical therapy will be recommended.  The patient will require more optimal blood pressure control.  Recommend increase beta-blocker therapy.  Recommend addition of Plavix   Recommend uninterrupted dual antiplatelet therapy with Aspirin '81mg'$  daily and Clopidogrel  $'75mg'k$  daily for a minimum of one month and longer if tolerated with multiple stents and recent occlusion.  EKG:  EKG is  ordered today.  The ekg ordered today demonstrates sinus rhythm HR 68, T wave flattening lateral leads, stable from prior  Recent Labs: 07/11/2021: Magnesium 1.9; TSH 1.067 07/17/2021: ALT 17; BUN 17; Creatinine 1.07; Hemoglobin 8.5; Platelet Count 148; Potassium 3.6; Sodium 143  Recent Lipid Panel    Component Value Date/Time   CHOL 111 01/18/2020 1050   TRIG 146 01/18/2020 1050   HDL 35 (L) 01/18/2020 1050   CHOLHDL 3.2 01/18/2020 1050   CHOLHDL 2.4 02/11/2017 1002   VLDL 18 02/11/2017 1002   LDLCALC 51 01/18/2020 1050    Physical Exam:    VS:  BP 124/76   Pulse 68   Ht '5\' 2"'$  (1.575 m)   Wt 204 lb (92.5 kg)   SpO2 99%   BMI 37.31 kg/m     Wt Readings from Last 3 Encounters:  08/10/21 204 lb (92.5 kg)  08/09/21 206 lb 12.8 oz (93.8 kg)  07/17/21 202 lb 9.6 oz (91.9 kg)     GEN:  Well nourished, well developed in no acute distress HEENT: Normal NECK: No JVD; No carotid bruits LYMPHATICS: No lymphadenopathy CARDIAC: RRR, no murmurs, rubs, gallops RESPIRATORY:  Clear to auscultation without rales, wheezing or rhonchi  ABDOMEN: Soft, non-tender, non-distended MUSCULOSKELETAL:  No edema; No deformity  SKIN: Warm and dry NEUROLOGIC:  Alert and oriented x 3 PSYCHIATRIC:  Normal affect   ASSESSMENT:    1. Chest pain, unspecified type   2. Essential hypertension   3. Chronic diastolic CHF (congestive heart failure) ([place])   4.  Dyslipidemia (high LDL; low HDL)   5. Gastroesophageal reflux disease, unspecified whether esophagitis present   6. Coronary artery disease of native artery of native heart with stable angina pectoris ([place])   7. Diabetes mellitus type 2 in obese ([place])   8. Morbid obesity (HCC)    PLAN:    In order of problems listed above:  Chest pain GERD - negative troponin x 2 - BID protonix --> no just once daily - PPI has completely resolved her symptoms   Chronic diastolic heart failure LVH Hypertension - continue 100 mg lopressor BID, 20 mg lisinopril, 60 mg imdur - appears euvolemic, no change in therapy   CAD s/p PCI Hyperlipidemia with LDL goal < 70 - she has a history of PCI to RCA, LAD, Lcx with remaining distal disease - last cath in 2019 without intervention - continue ASA and statin - need updated lipid panel - she is not fasting today, I have asked her to have these drawn with her next fasting labs - LDL in 01/2020 was 51 - she does have some DOE which I believe is consistent with deconditioning, she only walks around the house   DM - metformin, trulicity, amaryl - 123456 8.0% - she is working on this   She is recovering from chemo and radiation. I have asked her to start walking 5 min daily once finished with radiation. She would like to keep her appt next month with Dr. Timmie Foerster to evaluate how she is doing with increased activity. If no symptoms, she may push out her appt with Dr. Sallyanne Kuster.     Medication Adjustments/Labs and Tests Ordered: Current medicines are reviewed at length with the patient today.  Concerns regarding medicines are outlined above.  Orders Placed This Encounter  Procedures   EKG 12-Lead  No orders of the defined types were placed in this encounter.   Signed, Ledora Bottcher, [place]  08/10/2021 8:39 AM    [facility]

## 2021-07-27 NOTE — Telephone Encounter (Signed)
Called and given below message to daughter, [name]. She verbalized understanding.

## 2021-07-27 NOTE — Telephone Encounter (Signed)
Returned her call. She was trying to refill Doxazosin thru her pharmacy. They said the Rx was discontinued. She is still taking the Rx. She is asking if that is true? She uses [facility].

## 2021-07-30 ENCOUNTER — Telehealth: Payer: Self-pay | Admitting: *Deleted

## 2021-07-30 NOTE — Telephone Encounter (Signed)
CALLED PATIENT TO REMND OF HDR [place] 07-31-21 @ 11 AM @ [place], SPOKE WITH PATIENT'S DAUGHTER- [name] AND SHE IS AWARE OF THIS [place].

## 2021-07-30 NOTE — Progress Notes (Signed)
  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 She will be treated with a 3.0 cm diameter segmented cylinder. This conforms to her anatomy without undue discomfort.  Vaginal brachytherapy procedure node: The patient was brought to the [place] suite. Identity was confirmed. All relevant records and images related to the planned course of therapy were reviewed. The patient freely provided informed written consent to proceed with treatment after reviewing the details related to the planned course of therapy. The consent form was witnessed and verified by the simulation staff. Then, the patient was set-up in a stable reproducible supine position for radiation therapy. Pelvic exam revealed the vaginal cuff to be intact . The patient's custom vaginal cylinder was placed in the proximal vagina. This was affixed to the CT/MR stabilization plate to prevent slippage. Patient tolerated the placement well.  Verification simulation note:  A fiducial marker was placed within the vaginal cylinder. An AP and lateral film was then obtained through the pelvis area. This documented accurate position of the vaginal cylinder for treatment.  HDR BRACHYTHERAPY TREATMENT  The remote afterloading device was affixed to the vaginal cylinder by catheter. Patient then proceeded to undergo her second high-dose-rate treatment directed at the proximal vagina. The patient was prescribed a dose of 6 gray to be delivered to the mucosal surface. Treatment length was 3.0 cm. Patient was treated with 1 channel using 7 dwell  positions. Treatment time was 240.1 seconds. Iridium 192 was the high-dose-rate source for treatment. The patient tolerated the treatment well. After completion of her therapy, a radiation survey was performed documenting return of the iridium source into the GammaMed safe.   PLAN: The patient will return next week for her third high dose rate treatment.  ________________________________  -----------------------------------  Blair Promise, PhD, MD  This document serves as a record of services personally performed by Gery Pray, MD. It was created on his behalf by Roney Mans, a trained medical scribe. The creation of this record is based on the scribe's personal observations and the provider's statements to them. This document has been checked and approved by the attending provider.

## 2021-07-31 ENCOUNTER — Ambulatory Visit
Admission: RE | Admit: 2021-07-31 | Discharge: 2021-07-31 | Disposition: A | Discharge status: Home / Self Care | Payer: Self-pay | Source: Ambulatory Visit | Attending: Radiation Oncology | Admitting: Radiation Oncology

## 2021-07-31 ENCOUNTER — Other Ambulatory Visit: Payer: Self-pay

## 2021-07-31 DIAGNOSIS — C541 Malignant neoplasm of endometrium: Secondary | ICD-10-CM

## 2021-08-04 NOTE — Progress Notes (Signed)
  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 She will be treated with a 3.0 cm diameter segmented cylinder. This conforms to her anatomy without undue discomfort.  Vaginal brachytherapy procedure node: The patient was brought to the [place] suite. Identity was confirmed. All relevant records and images related to the planned course of therapy were reviewed. The patient freely provided informed written consent to proceed with treatment after reviewing the details related to the planned course of therapy. The consent form was witnessed and verified by the simulation staff. Then, the patient was set-up in a stable reproducible supine position for radiation therapy. Pelvic exam revealed the vaginal cuff to be intact . The patient's custom vaginal cylinder was placed in the proximal vagina. This was affixed to the CT/MR stabilization plate to prevent slippage. Patient tolerated the placement well.  Verification simulation note:  A fiducial marker was placed within the vaginal cylinder. An AP and lateral film was then obtained through the pelvis area. This documented accurate position of the vaginal cylinder for treatment.  HDR BRACHYTHERAPY TREATMENT  The remote afterloading device was affixed to the vaginal cylinder by catheter. Patient then proceeded to undergo her third high-dose-rate treatment directed at the proximal vagina. The patient was prescribed a dose of 6 gray to be delivered to the mucosal surface. Treatment length was 3.0 cm. Patient was treated with 1 channel using 7 dwell  positions. Treatment time was 256.4 seconds. Iridium 192 was the high-dose-rate source for treatment. The patient tolerated the treatment well. After completion of her therapy, a radiation survey was performed documenting return of the iridium source into the GammaMed safe.   PLAN: The patient will return next week for her fourth high dose rate treatment.  ________________________________  -----------------------------------  Blair Promise, PhD, MD  This document serves as a record of services personally performed by Gery Pray, MD. It was created on his behalf by Roney Mans, a trained medical scribe. The creation of this record is based on the scribe's personal observations and the provider's statements to them. This document has been checked and approved by the attending provider.

## 2021-08-06 ENCOUNTER — Telehealth: Payer: Self-pay | Admitting: *Deleted

## 2021-08-06 NOTE — Telephone Encounter (Signed)
Called patient to remind of HDR Tx.for 08-07-21 @ 11 am, lvm for a return call

## 2021-08-07 ENCOUNTER — Ambulatory Visit
Admission: RE | Admit: 2021-08-07 | Discharge: 2021-08-07 | Disposition: A | Discharge status: Home / Self Care | Payer: Self-pay | Source: Ambulatory Visit | Attending: Radiation Oncology | Admitting: Radiation Oncology

## 2021-08-07 ENCOUNTER — Other Ambulatory Visit: Payer: Self-pay

## 2021-08-07 DIAGNOSIS — C541 Malignant neoplasm of endometrium: Secondary | ICD-10-CM

## 2021-08-09 ENCOUNTER — Inpatient Hospital Stay (HOSPITAL_BASED_OUTPATIENT_CLINIC_OR_DEPARTMENT_OTHER): Payer: Self-pay | Admitting: Hematology and Oncology

## 2021-08-09 ENCOUNTER — Other Ambulatory Visit: Payer: Self-pay

## 2021-08-09 ENCOUNTER — Inpatient Hospital Stay: Payer: Self-pay

## 2021-08-09 ENCOUNTER — Encounter: Payer: Self-pay | Admitting: Hematology and Oncology

## 2021-08-09 VITALS — BP 135/60 | HR 69 | Temp 98.0°F | Resp 18 | Ht 63.0 in | Wt 206.8 lb

## 2021-08-09 DIAGNOSIS — C541 Malignant neoplasm of endometrium: Secondary | ICD-10-CM

## 2021-08-09 DIAGNOSIS — D61818 Other pancytopenia: Secondary | ICD-10-CM

## 2021-08-09 DIAGNOSIS — I5032 Chronic diastolic (congestive) heart failure: Secondary | ICD-10-CM

## 2021-08-09 MED ORDER — SODIUM CHLORIDE 0.9% FLUSH
10.0000 mL | Freq: Once | INTRAVENOUS | Status: AC
  Administered 2021-08-09: 10 mL

## 2021-08-09 MED ORDER — HEPARIN SOD (PORK) LOCK FLUSH 100 UNIT/ML IV SOLN
500.0000 [IU] | Freq: Once | INTRAVENOUS | Status: AC
  Administered 2021-08-09: 500 [IU]

## 2021-08-09 NOTE — Assessment & Plan Note (Signed)
She had recent pancytopenia due to treatment Unfortunately, her labs were not repeated today We discussed the risk and benefits of repeating her labs today but she felt confident given her overall improvement in energy level, we can hold off repeat labs until next month

## 2021-08-09 NOTE — Progress Notes (Signed)
[place] OFFICE PROGRESS NOTE  Patient Care Team: [name], MD as PCP - General (Family Medicine) Croitoru, Dani Gobble, MD as PCP - Cardiology (Cardiology)  ASSESSMENT & PLAN:  Papillary serous adenocarcinoma of endometrium [facility]) She is recovering well from recent chemotherapy Continue supportive care and radiation I plan to repeat imaging study at the end of September for further follow-up  Chronic diastolic heart failure ([place]) Her blood pressure is good She denies recent signs or symptoms of congestive heart failure Observe closely and she will continue medical management  Pancytopenia, acquired [facility]) She had recent pancytopenia due to treatment Unfortunately, her labs were not repeated today We discussed the risk and benefits of repeating her labs today but she felt confident given her overall improvement in energy level, we can hold off repeat labs until next month  Orders Placed This Encounter  Procedures   CT ABDOMEN PELVIS W CONTRAST    Standing Status:   Future    Standing Expiration Date:   08/09/2022    Order Specific Question:   If indicated for the ordered procedure, I authorize the administration of contrast media per Radiology protocol    Answer:   Yes    Order Specific Question:   Preferred imaging location?    Answer:   [facility]    Order Specific Question:   Radiology Contrast Protocol - do NOT remove file path    Answer:   [email or web address]    All questions were answered. The patient knows to call the clinic with any problems, questions or concerns. The total time spent in the appointment was 20 minutes encounter with patients including review of chart and various tests results, discussions about plan of care and coordination of care plan   Heath Lark, MD 08/09/2021 12:05 PM  INTERVAL HISTORY: Please see below for problem oriented charting. She returns with her daughter for further  follow-up She is doing well Her energy level has improved She has occasional mild nausea but no recent changes in bowel habits No recent bleeding No recent signs or symptoms of congestive heart failure  SUMMARY OF ONCOLOGIC HISTORY: Oncology History Overview Note  Pap 12/14/20: adenocarcinoma, HPV negative  HER-2 positive by FISH Her-2 equivocal by [facility]  MSI stable    Papillary serous adenocarcinoma of endometrium ([place])   Initial Diagnosis   Endometrial carcinoma ([place])   01/12/2021 Initial Biopsy   A. ENDOCERVIX, CURETTAGE:  - Adenocarcinoma.  B. ENDOMETRIUM, BIOPSY:  - Adenocarcinoma.  COMMENT:  The differential diagnosis includes high grade serous carcinoma.  Both  specimens have a similar morphology; high grade serous carcinoma more  commonly arises in the endometrium.  Results reported to Dr. Hale Bogus  on 01/15/2021.  Dr. Saralyn Pilar reviewed the case.    01/16/2021 Tumor Marker   Patient's tumor was tested for the following markers: CA-125 Results of the tumor marker test revealed normal value, 10.7   01/25/2021 Imaging   CT C/A/P: 1. Small volume fluid in the endometrial canal with 7 mm hypoattenuating lesion in the myometrium of the anterior fundus. 2. No definite evidence for metastatic disease in the chest, abdomen, or pelvis. Small lymph nodes are noted along both pelvic sidewalls. Attention on follow-up recommended. 3. 5 mm ground-glass opacity peripheral left upper lobe. This is likely related to infection/inflammation and potentially scar. Attention on surveillance imaging recommended. 4. 5.8 cm lesion posterior lower uterine segment likely a fibroid. Ultrasound exam from 01/28/2010 demonstrated a 5.2 cm lesion in the left aspect of  the posterior lower uterine body. 5. Aortic Atherosclerosis (ICD10-I70.0).   01/29/2021 Surgery   TRH/BSO, SLN biopsy left, selective pelvic LND right, omentectomy  Findings: On EUA, 10-12cm enlarged mobile uterus. On  intra-abdomina entry, minimal adhesions between the liver and anterior abdomen on the right. Some changes c/w fatty liver on the left. Omentum, stomach, small and large bowel all grossly normal. Bilateral ovaries normal appearing. Uterus with 5-6cm posterior fibroid, otherwise normal appearing. No mapping to right pelvis. On left, mapping to the level of the superior vessel artery, enlarged lymph node noted (likely sentinel) within the upper aspect of the obturator space. In bilateral pelvic basins, multiple enlarged lymph nodes. On the right, enlarged lymph nodes extended superiorly along the common iliac vessels. At the end of surgery, no obvious abdominal or pelvic evidence of disease.   01/29/2021 Pathology Results   A. SENTINEL LYMPH NODE, LEFT INTERNAL ILIAC, BIOPSY:  - Metastatic carcinoma in (1) of (1) lymph node.   B. SENTINEL LYMPH NODE, LEFT OBTURATOR, BIOPSY:  - Metastatic carcinoma in (1) of (1) lymph node.   C. LYMPH NODES, LEFT PELVIC, DISSECTION:  - Metastatic carcinoma in (1) of (3) lymph nodes.   D. LYMPH NODE, RIGHT EXTERNAL AND COMMON ILIAC, BIOPSY:  - Metastatic carcinoma in (1) of (1) lymph node.   E. UTERUS, CERVIX AND BILATERAL FALLOPIAN TUBES AND OVARIES, TOTAL  HYSTERECTOMY AND BILATERAL SALPINGO-OOPHORECTOMY:  - High grade serous carcinoma of endometrium, with invasion more than  half of the myometrium.  - Tumor invades the stromal connective tissue of the cervix.  - No involvement of uterine serosa or adnexa.  - Lymphovascular invasion is identified.  - See oncology table.   F. OMENTUM, OMENTECTOMY:  - Omentum, negative for carcinoma.   G. LYMPH NODES, RIGHT PELVIC, DISSECTION:  - Two lymph nodes, negative for carcinoma (0/2).   ONCOLOGY TABLE:   UTERUS, CARCINOMA OR CARCINOSARCOMA: Resection   Procedure: Total hysterectomy and bilateral salpingo-oophorectomy,  Omentectomy, Lymph node sampling  Histologic Type: Serous carcinoma  Histologic Grade: High  grade  Myometrial Invasion: > 50%  Uterine Serosa Involvement: Not identified  Cervical Stroma Involvement: Present  Other Tissue/Organ Involvement: Not identified  Peritoneal/Ascitic Fluid: Negative for carcinoma  Lymphovascular Invasion: Present  Regional Lymph Nodes:       Pelvic Lymph Nodes Examined:            2 Sentinel            6 Non-Sentinel            8 Total       Pelvic Lymph Nodes with Metastasis: 4            Macrometastasis: 3            Micrometastasis: 1            Isolated Tumor Cells: 0            Laterality of Lymph Nodes with Tumor: Right (non-sentinel),  Left (sentinel and non-sentinel)            Extracapsular Extension: Present       Para-Aortic Lymph Nodes Examined: Not applicable        Para-Aortic Lymph Nodes with Metastasis: Not applicable  Distant Metastasis:       Distant Site(s) Involved: Omentum: Not involved  Pathologic Stage Classification (pTNM, AJCC 8th Edition): pT2, pN1a  Ancillary Studies: HER2 will be ordered  Additional Findings: Leiomyomata.  Adenomyosis.  Representative Tumor Block: B1  Comment: Dr. Saralyn Pilar  reviewed select slides.  (v4.2.0.1)    01/29/2021 Cancer Staging   Staging form: Corpus Uteri - Carcinoma and Carcinosarcoma, AJCC 8th Edition - Clinical stage from 01/29/2021: FIGO Stage IIIC1 (cT1b, cN1a, cM0) - Signed by Lafonda Mosses, MD on 02/02/2021 Histopathologic type: Mixed cell adenocarcinoma Stage prefix: Initial diagnosis Method of lymph node assessment: Other Histologic grade (G): G3 Histologic grading system: 3 grade system Lymph-vascular invasion (LVI): LVI present/identified, NOS Peritoneal cytology results: Negative Pelvic nodal status: Positive Number of pelvic nodes positive from dissection: 4 Number of pelvic nodes examined during dissection: 8 Para-aortic status: Not assessed Lymph node metastasis: Present Omentectomy performed: Yes Morcellation performed: No   02/19/2021 Echocardiogram    1. Left  ventricular ejection fraction, by estimation, is 55 to 60%. The left ventricle has normal function. The left ventricle demonstrates regional wall motion abnormalities (see scoring diagram/findings for description). There is mild left ventricular  hypertrophy. Left ventricular diastolic parameters are consistent with Grade I diastolic dysfunction (impaired relaxation). There is moderate hypokinesis of the left ventricular, basal septal wall and inferior wall. The average left ventricular global longitudinal strain is -17.9 %. The global longitudinal strain is abnormal.  2. Right ventricular systolic function is normal. The right ventricular size is normal. There is normal pulmonary artery systolic pressure. The estimated right ventricular systolic pressure is 35.3 mmHg.  3. The mitral valve is abnormal. Trivial mitral valve regurgitation.  4. The aortic valve is tricuspid. Aortic valve regurgitation is not visualized.  5. The inferior vena cava is normal in size with greater than 50% respiratory variability, suggesting right atrial pressure of 3 mmHg.   02/20/2021 Procedure   Successful placement of a right IJ approach Power Port with ultrasound and fluoroscopic guidance. The catheter is ready for use.   02/28/2021 -  Chemotherapy    Patient is on Treatment Plan: UTERINE CARBOPLATIN AUC 6 / PACLITAXEL Q21D       04/20/2021 Imaging   Status post hysterectomy and suspected bilateral salpingo-oophorectomy.   2.2 cm fluid density lesion along the left pelvic sidewall likely reflects a postoperative seroma.   No findings suspicious for recurrent or metastatic disease.     REVIEW OF SYSTEMS:   Constitutional: Denies fevers, chills or abnormal weight loss Eyes: Denies blurriness of vision Ears, nose, mouth, throat, and face: Denies mucositis or sore throat Respiratory: Denies cough, dyspnea or wheezes Cardiovascular: Denies palpitation, chest discomfort or lower extremity swelling Gastrointestinal:   Denies nausea, heartburn or change in bowel habits Skin: Denies abnormal skin rashes Lymphatics: Denies new lymphadenopathy or easy bruising Neurological:Denies numbness, tingling or new weaknesses Behavioral/Psych: Mood is stable, no new changes  All other systems were reviewed with the patient and are negative.  I have reviewed the past medical history, past surgical history, social history and family history with the patient and they are unchanged from previous note.  ALLERGIES:  has No Known Allergies.  MEDICATIONS:  Current Outpatient Medications  Medication Sig Dispense Refill   acetaminophen (TYLENOL) 500 MG tablet Take 1 tablet (500 mg total) by mouth every 6 (six) hours as needed. (Patient taking differently: Take 500 mg by mouth every 6 (six) hours as needed for mild pain or headache.) 30 tablet 0   aspirin 81 MG EC tablet Take 1 tablet (81 mg total) by mouth daily. 30 tablet 12   atorvastatin (LIPITOR) 80 MG tablet TAKE 1 TABLET (80 MG TOTAL) BY MOUTH AT BEDTIME. 30 tablet 6   bismuth subsalicylate (PEPTO BISMOL) 262 MG/15ML suspension  Take 30 mLs by mouth every 6 (six) hours as needed for indigestion or diarrhea or loose stools. (Patient not taking: Reported on 07/16/2021)     Dulaglutide (TRULICITY) 1.5 TF/5.7DU SOPN Inject 1.5 mg into the skin once a week. 2 mL 2   furosemide (LASIX) 20 MG tablet Take 2 tablets (40 mg total) by mouth daily. 60 tablet 2   glimepiride (AMARYL) 4 MG tablet TAKE 2 TABLETS (8 MG TOTAL) BY MOUTH DAILY WITH BREAKFAST. 60 tablet 6   Insulin Pen Needle (TRUEPLUS PEN NEEDLES) 32G X 4 MM MISC Use as directed to inject victoza once daily 100 each 3   isosorbide mononitrate (IMDUR) 30 MG 24 hr tablet Take 2 tablets (60 mg total) by mouth daily. 60 tablet 2   lidocaine-prilocaine (EMLA) cream APPLY TO AFFECTED AREA ONCE AS DIRECTED 30 g 3   lisinopril (ZESTRIL) 20 MG tablet TAKE 1 TABLET BY MOUTH ONCE A DAY 30 tablet 2   metFORMIN (GLUCOPHAGE) 500 MG tablet  Take 2 tablets (1,000 mg total) by mouth 2 (two) times daily with a meal. 120 tablet 2   metoprolol tartrate (LOPRESSOR) 100 MG tablet TAKE 1 TABLET (100 MG TOTAL) BY MOUTH 2 (TWO) TIMES DAILY. 60 tablet 2   nitroGLYCERIN (NITROSTAT) 0.4 MG SL tablet Place 1 tablet (0.4 mg total) under the tongue every 5 (five) minutes as needed for chest pain. 25 tablet 2   ondansetron (ZOFRAN) 8 MG tablet TAKE 1 TABLET BY MOUTH EVERY 8 HOURS AS NEEDED. (Patient not taking: Reported on 07/16/2021) 30 tablet 1   pantoprazole (PROTONIX) 40 MG tablet Take 1 tablet (40 mg total) by mouth 2 (two) times daily before a meal for 14 days, THEN 1 tablet (40 mg total) daily. 60 tablet 0   prochlorperazine (COMPAZINE) 10 MG tablet TAKE 1 TABLET BY MOUTH EVERY 6 HOURS AS NEEDED (NAUSEA OR VOMITING). (Patient not taking: No sig reported) 30 tablet 1   senna-docusate (SENOKOT-S) 8.6-50 MG tablet TAKE 2 TABLETS BY MOUTH AT BEDTIME. FOR AFTER SURGERY, DO NOT TAKE IF HAVING DIARRHEA (Patient not taking: No sig reported) 30 tablet 0   traMADol (ULTRAM) 50 MG tablet Take 1 tablet (50 mg total) by mouth every 6 (six) hours as needed. 30 tablet 0   No current facility-administered medications for this visit.    PHYSICAL EXAMINATION: ECOG PERFORMANCE STATUS: 1 - Symptomatic but completely ambulatory  Vitals:   08/09/21 0823  BP: 135/60  Pulse: 69  Resp: 18  Temp: 98 F (36.7 C)  SpO2: 100%   Filed Weights   08/09/21 0823  Weight: 206 lb 12.8 oz (93.8 kg)    GENERAL:alert, no distress and comfortable NEURO: alert & oriented x 3 with fluent speech, no focal motor/sensory deficits  LABORATORY DATA:  I have reviewed the data as listed    Component Value Date/Time   NA 143 07/17/2021 1009   NA 143 10/18/2020 1028   K 3.6 07/17/2021 1009   CL 108 07/17/2021 1009   CO2 24 07/17/2021 1009   GLUCOSE 138 (H) 07/17/2021 1009   BUN 17 07/17/2021 1009   BUN 13 10/18/2020 1028   CREATININE 1.07 (H) 07/17/2021 1009    CREATININE 0.79 02/11/2017 0943   CALCIUM 9.5 07/17/2021 1009   PROT 7.4 07/17/2021 1009   PROT 6.7 10/18/2020 1028   ALBUMIN 3.7 07/17/2021 1009   ALBUMIN 4.1 10/18/2020 1028   AST 17 07/17/2021 1009   ALT 17 07/17/2021 1009   ALKPHOS 86 07/17/2021 1009  BILITOT 0.5 07/17/2021 1009   GFRNONAA 59 (L) 07/17/2021 1009   GFRNONAA 83 02/11/2017 0943   GFRAA 70 10/18/2020 1028   GFRAA >89 02/11/2017 0943    No results found for: SPEP, UPEP  Lab Results  Component Value Date   WBC 3.4 (L) 07/17/2021   NEUTROABS 1.5 (L) 07/17/2021   HGB 8.5 (L) 07/17/2021   HCT 25.7 (L) 07/17/2021   MCV 97.0 07/17/2021   PLT 148 (L) 07/17/2021      Chemistry      Component Value Date/Time   NA 143 07/17/2021 1009   NA 143 10/18/2020 1028   K 3.6 07/17/2021 1009   CL 108 07/17/2021 1009   CO2 24 07/17/2021 1009   BUN 17 07/17/2021 1009   BUN 13 10/18/2020 1028   CREATININE 1.07 (H) 07/17/2021 1009   CREATININE 0.79 02/11/2017 0943      Component Value Date/Time   CALCIUM 9.5 07/17/2021 1009   ALKPHOS 86 07/17/2021 1009   AST 17 07/17/2021 1009   ALT 17 07/17/2021 1009   BILITOT 0.5 07/17/2021 1009

## 2021-08-09 NOTE — Assessment & Plan Note (Signed)
Her blood pressure is good She denies recent signs or symptoms of congestive heart failure Observe closely and she will continue medical management

## 2021-08-09 NOTE — Assessment & Plan Note (Signed)
She is recovering well from recent chemotherapy Continue supportive care and radiation I plan to repeat imaging study at the end of September for further follow-up

## 2021-08-10 ENCOUNTER — Telehealth: Payer: Self-pay | Admitting: *Deleted

## 2021-08-10 ENCOUNTER — Ambulatory Visit (INDEPENDENT_AMBULATORY_CARE_PROVIDER_SITE_OTHER): Payer: Self-pay | Admitting: Physician Assistant

## 2021-08-10 ENCOUNTER — Encounter: Payer: Self-pay | Admitting: Physician Assistant

## 2021-08-10 VITALS — BP 124/76 | HR 68 | Ht 62.0 in | Wt 204.0 lb

## 2021-08-10 DIAGNOSIS — I25118 Atherosclerotic heart disease of native coronary artery with other forms of angina pectoris: Secondary | ICD-10-CM

## 2021-08-10 DIAGNOSIS — E669 Obesity, unspecified: Secondary | ICD-10-CM

## 2021-08-10 DIAGNOSIS — I5032 Chronic diastolic (congestive) heart failure: Secondary | ICD-10-CM

## 2021-08-10 DIAGNOSIS — E785 Hyperlipidemia, unspecified: Secondary | ICD-10-CM

## 2021-08-10 DIAGNOSIS — R079 Chest pain, unspecified: Secondary | ICD-10-CM

## 2021-08-10 DIAGNOSIS — I10 Essential (primary) hypertension: Secondary | ICD-10-CM

## 2021-08-10 DIAGNOSIS — K219 Gastro-esophageal reflux disease without esophagitis: Secondary | ICD-10-CM

## 2021-08-10 DIAGNOSIS — E1169 Type 2 diabetes mellitus with other specified complication: Secondary | ICD-10-CM

## 2021-08-10 NOTE — Patient Instructions (Signed)
Medication Instructions:  No changes  *If you need a refill on your cardiac medications before your next appointment, please call your pharmacy*   Lab Work: No Labs If you have labs (blood work) drawn today and your tests are completely normal, you will receive your results only by: [place] (if you have MyChart) OR A paper copy in the mail If you have any lab test that is abnormal or we need to change your treatment, we will call you to review the results.   Testing/Procedures: No Testing   Follow-Up: At [facility], you and your health needs are our priority.  As part of our continuing mission to provide you with exceptional heart care, we have created designated Provider Care Teams.  These Care Teams include your primary Cardiologist (physician) and Advanced Practice Providers (APPs -  Physician Assistants and Nurse Practitioners) who all work together to provide you with the care you need, when you need it.  We recommend signing up for the patient portal called "MyChart".  Sign up information is provided on this After Visit Summary.  MyChart is used to connect with patients for Virtual Visits (Telemedicine).  Patients are able to view lab/test results, encounter notes, upcoming appointments, etc.  Non-urgent messages can be sent to your provider as well.   To learn more about what you can do with MyChart, go to [email or web address].    Your next appointment:   September 11, 2021 1:20 PM  The format for your next appointment:   In Person  Provider:   Sanda Klein, MD

## 2021-08-10 NOTE — Telephone Encounter (Signed)
CALLED PATIENT TO REMIND OF [facility] [place]. ON 08-13-21 @ 10 AM, SPOKE WITH PATIENT AND SHE IS AWARE OF THIS [place].

## 2021-08-10 NOTE — Progress Notes (Signed)
  Radiation Oncology         [phone] ________________________________  Name: [name] MRN: [id number]  Date: 08/13/2021  DOB: [birth year]  CC: [name], MD  [name], MD  HDR BRACHYTHERAPY NOTE  DIAGNOSIS: The encounter diagnosis was Papillary serous adenocarcinoma of endometrium ([place]).   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 She will be treated with a 3.0 cm diameter segmented cylinder. This conforms to her anatomy without undue discomfort.  Vaginal brachytherapy procedure node: The patient was brought to the [place] suite. Identity was confirmed. All relevant records and images related to the planned course of therapy were reviewed. The patient freely provided informed written consent to proceed with treatment after reviewing the details related to the planned course of therapy. The consent form was witnessed and verified by the simulation staff. Then, the patient was set-up in a stable reproducible supine position for radiation therapy. Pelvic exam revealed the vaginal cuff to be intact . The patient's custom vaginal cylinder was placed in the proximal vagina. This was affixed to the CT/MR stabilization plate to prevent slippage. Patient tolerated the placement well.  Verification simulation note:  A fiducial marker was placed within the vaginal cylinder. An AP and lateral film was then obtained through the pelvis area. This documented accurate position of the vaginal cylinder for treatment.  HDR BRACHYTHERAPY TREATMENT  The remote afterloading device was affixed to the vaginal cylinder by catheter. Patient then proceeded to undergo her fourth high-dose-rate treatment directed at the proximal vagina. The patient was prescribed a dose of 6 gray to be delivered to the mucosal surface. Treatment length was 3.0 cm. Patient was treated with 1 channel using 7 dwell  positions. Treatment time was 271.2 seconds. Iridium 192 was the high-dose-rate source for treatment. The patient tolerated the treatment well. After completion of her therapy, a radiation survey was performed documenting return of the iridium source into the Gamma[employer] safe.   PLAN: The patient will return next week for her fifth high dose rate treatment.  ________________________________  -----------------------------------  Blair Promise, PhD, MD  This document serves as a record of services personally performed by Gery Pray, MD. It was created on his behalf by Roney Mans, a trained medical scribe. The creation of this record is based on the scribe's personal observations and the provider's statements to them. This document has been checked and approved by the attending provider.

## 2021-08-13 ENCOUNTER — Other Ambulatory Visit: Payer: Self-pay

## 2021-08-13 ENCOUNTER — Ambulatory Visit
Admission: RE | Admit: 2021-08-13 | Discharge: 2021-08-13 | Disposition: A | Discharge status: Home / Self Care | Payer: Self-pay | Source: Ambulatory Visit | Attending: Radiation Oncology | Admitting: Radiation Oncology

## 2021-08-13 DIAGNOSIS — C541 Malignant neoplasm of endometrium: Secondary | ICD-10-CM

## 2021-08-15 ENCOUNTER — Telehealth: Payer: Self-pay | Admitting: *Deleted

## 2021-08-15 NOTE — Telephone Encounter (Signed)
Returned patient's daughter's phone call, spoke with [name]

## 2021-08-15 NOTE — Progress Notes (Signed)
  Radiation Oncology         [phone] ________________________________  Name: [name] MRN: [id number]  Date: 08/16/2021  DOB: 13-May-1956  CC: [name], MD  [name], MD  HDR BRACHYTHERAPY NOTE  DIAGNOSIS: The encounter diagnosis was Papillary serous adenocarcinoma of endometrium ([place]).   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 She will be treated with a 3.0 cm diameter segmented cylinder. This conforms to her anatomy without undue discomfort.  Vaginal brachytherapy procedure node: The patient was brought to the [place] suite. Identity was confirmed. All relevant records and images related to the planned course of therapy were reviewed. The patient freely provided informed written consent to proceed with treatment after reviewing the details related to the planned course of therapy. The consent form was witnessed and verified by the simulation staff. Then, the patient was set-up in a stable reproducible supine position for radiation therapy. Pelvic exam revealed the vaginal cuff to be intact . The patient's custom vaginal cylinder was placed in the proximal vagina. This was affixed to the CT/MR stabilization plate to prevent slippage. Patient tolerated the placement well.  Verification simulation note:  A fiducial marker was placed within the vaginal cylinder. An AP and lateral film was then obtained through the pelvis area. This documented accurate position of the vaginal cylinder for treatment.  HDR BRACHYTHERAPY TREATMENT  The remote afterloading device was affixed to the vaginal cylinder by catheter. Patient then proceeded to undergo her fifth high-dose-rate treatment directed at the proximal vagina. The patient was prescribed a dose of 6 gray to be delivered to the mucosal surface. Treatment length was 3.0 cm. Patient was treated with 1 channel using 7 dwell  positions. Treatment time was 279.1 seconds. Iridium 192 was the high-dose-rate source for treatment. The patient tolerated the treatment well. After completion of her therapy, a radiation survey was performed documenting return of the iridium source into the GammaMed safe.   PLAN: The patient has completed her high-dose-rate treatments. She will return for follow-up in one month.  Overall she tolerated her brachytherapy well without any significant side effects. ________________________________  -----------------------------------  Blair Promise, PhD, MD  This document serves as a record of services personally performed by Gery Pray, MD. It was created on his behalf by Roney Mans, a trained medical scribe. The creation of this record is based on the scribe's personal observations and the provider's statements to them. This document has been checked and approved by the attending provider.

## 2021-08-15 NOTE — Telephone Encounter (Signed)
CALLED PATIENT TO REMIND OF [facility]DR [place] 08-16-21 @ 10 AM, SPOKE WITH PATIENT AND SHE IS AWARE OF THIS [place].

## 2021-08-16 ENCOUNTER — Ambulatory Visit
Admission: RE | Admit: 2021-08-16 | Discharge: 2021-08-16 | Disposition: A | Discharge status: Home / Self Care | Payer: Self-pay | Source: Ambulatory Visit | Attending: Radiation Oncology | Admitting: Radiation Oncology

## 2021-08-16 ENCOUNTER — Other Ambulatory Visit: Payer: Self-pay

## 2021-08-16 ENCOUNTER — Encounter: Payer: Self-pay | Admitting: Radiation Oncology

## 2021-08-16 DIAGNOSIS — Z923 Personal history of irradiation: Secondary | ICD-10-CM

## 2021-08-16 DIAGNOSIS — C541 Malignant neoplasm of endometrium: Secondary | ICD-10-CM | POA: Insufficient documentation

## 2021-08-16 DIAGNOSIS — Z510 Encounter for antineoplastic radiation therapy: Secondary | ICD-10-CM | POA: Insufficient documentation

## 2021-08-16 HISTORY — DX: Personal history of irradiation: Z92.3

## 2021-08-29 ENCOUNTER — Other Ambulatory Visit: Payer: Self-pay

## 2021-08-29 ENCOUNTER — Encounter: Payer: Self-pay | Admitting: Hematology and Oncology

## 2021-08-29 ENCOUNTER — Other Ambulatory Visit: Payer: Self-pay | Admitting: Internal Medicine

## 2021-09-03 ENCOUNTER — Other Ambulatory Visit: Payer: Self-pay

## 2021-09-03 ENCOUNTER — Other Ambulatory Visit: Payer: Self-pay | Admitting: Internal Medicine

## 2021-09-04 ENCOUNTER — Other Ambulatory Visit: Payer: Self-pay

## 2021-09-11 ENCOUNTER — Ambulatory Visit: Payer: Self-pay | Admitting: Cardiovascular Disease

## 2021-09-12 ENCOUNTER — Other Ambulatory Visit: Payer: Self-pay

## 2021-09-12 ENCOUNTER — Other Ambulatory Visit: Payer: Self-pay | Admitting: Family Medicine

## 2021-09-12 NOTE — Telephone Encounter (Signed)
Requested medication (s) are due for refill today: Yes  Requested medication (s) are on the active medication list:Yes  Last refill:  07/13/21  Future visit scheduled: Yes  Notes to clinic:  Unable to refill per protocol, last refill by another provider.      Requested Prescriptions  Pending Prescriptions Disp Refills   pantoprazole (PROTONIX) 40 MG tablet 60 tablet 0    Sig: Take 1 tablet (40 mg total) by mouth 2 (two) times daily before a meal for 14 days, THEN 1 tablet (40 mg total) daily.     Gastroenterology: Proton Pump Inhibitors Passed - 09/12/2021  2:37 PM      Passed - Valid encounter within last 12 months    Recent Outpatient Visits           9 months ago Post-menopausal bleeding   [place], [place], MD   10 months ago Type 2 diabetes mellitus with other circulatory complication, without long-term current use of insulin ([place])   [place], [name], MD   1 year ago Type 2 diabetes mellitus with other circulatory complication, without long-term current use of insulin ([place])   [place], [name], MD   1 year ago Type 2 diabetes mellitus with other circulatory complication, without long-term current use of insulin ([place])   [place], Enobong, MD   2 years ago Dyslipidemia   [place], Enobong, MD       Future Appointments             In 1 week [name], MD [place]

## 2021-09-13 ENCOUNTER — Other Ambulatory Visit: Payer: Self-pay

## 2021-09-13 ENCOUNTER — Telehealth: Payer: Self-pay

## 2021-09-13 ENCOUNTER — Encounter: Payer: Self-pay | Admitting: Hematology and Oncology

## 2021-09-13 MED ORDER — PANTOPRAZOLE SODIUM 40 MG PO TBEC
40.0000 mg | DELAYED_RELEASE_TABLET | Freq: Every day | ORAL | 0 refills | Status: DC
  Filled 2021-09-13 – 2022-01-14 (×3): qty 30, 30d supply, fill #0

## 2021-09-13 NOTE — Telephone Encounter (Signed)
Returned daughters call. Canceling 10/3 appts. Daughter moved CT scan to 10/14 due to her Mom having a cold. Appts moved and daughter is aware of new appts.  Just FYI

## 2021-09-14 ENCOUNTER — Inpatient Hospital Stay: Payer: Self-pay

## 2021-09-14 ENCOUNTER — Other Ambulatory Visit: Payer: Self-pay

## 2021-09-14 ENCOUNTER — Encounter: Payer: Self-pay | Admitting: Radiology

## 2021-09-14 ENCOUNTER — Ambulatory Visit (HOSPITAL_COMMUNITY): Payer: Self-pay

## 2021-09-17 ENCOUNTER — Inpatient Hospital Stay: Payer: Self-pay | Admitting: Hematology and Oncology

## 2021-09-19 NOTE — Progress Notes (Incomplete)
  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  Plan: The patient will follow-up with radiation oncology in one month .  ________________________________________________ -----------------------------------  Blair Promise, PhD, MD  This document serves as a record of services personally performed by Gery Pray, MD. It was created on his behalf by Roney Mans, a trained medical scribe. The creation of this record is based on the scribe's personal observations and the provider's statements to them. This document has been checked and approved by the attending provider.

## 2021-09-19 NOTE — Progress Notes (Signed)
Radiation Oncology         [phone] ________________________________  Name: [name] MRN: [id number]  Date: 09/20/2021  DOB: 21-Dec-1955  Follow-Up Visit Note  CC: [name], MD  [name], MD    ICD-10-CM   1. Papillary serous adenocarcinoma of endometrium ([facility])  C54.1       Diagnosis: The encounter diagnosis was papillary serous adenocarcinoma of endometrium ([place]).   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  Since her last visit, the patient completed chemotherapy treatment on 08/09/21, at which time the patient was noted by Dr. Alvy Bimler to have had recent pancytopenia due to treatment. The patient denied repeating labs because she felt confident given her overall improvement in energy level. The patient was otherwise reported to be recovering well from chemotherapy.       She tolerated her radiation therapy well without any obvious lasting side effects.  She has noticed some urinary urgency but only when her bladder is significantly full.  She denies any domino bloating vaginal bleeding or discharge.  She denies any hematuria or rectal bleeding.                             Allergies:  has No Known Allergies.  Meds: Current Outpatient Medications  Medication Sig Dispense Refill   acetaminophen (TYLENOL) 500 MG tablet Take 1 tablet (500 mg total) by mouth every 6 (six) hours as needed. (Patient taking differently: Take 500 mg by mouth every 6 (six) hours as needed for mild pain or headache.) 30 tablet 0   aspirin 81 MG EC tablet Take 1 tablet (81 mg total) by mouth daily. 30 tablet 12   atorvastatin (LIPITOR) 80 MG tablet TAKE 1  TABLET (80 MG TOTAL) BY MOUTH AT BEDTIME. 30 tablet 6   bismuth subsalicylate (PEPTO BISMOL) 262 MG/15ML suspension Take 30 mLs by mouth every 6 (six) hours as needed for indigestion or diarrhea or loose stools.     Dulaglutide (TRULICITY) 1.5 WF/0.9NA SOPN Inject 1.5 mg into the skin once a week. 2 mL 2   furosemide (LASIX) 20 MG tablet Take 2 tablets (40 mg total) by mouth daily. 60 tablet 2   gabapentin (NEURONTIN) 300 MG capsule Take 1 capsule (300 mg total) by mouth 2 (two) times daily. 60 capsule 3   glimepiride (AMARYL) 4 MG tablet Take 1 tablet (4 mg total) by mouth daily with breakfast. 30 tablet 6   isosorbide mononitrate (IMDUR) 30 MG 24 hr tablet Take 2 tablets (60 mg total) by mouth daily. 60 tablet 2   lisinopril (ZESTRIL) 20 MG tablet TAKE 1 TABLET BY MOUTH ONCE A DAY 30 tablet 2   metFORMIN (GLUCOPHAGE) 500 MG tablet Take 2 tablets (1,000 mg total) by mouth 2 (two) times daily with a meal. 120 tablet 2   metoprolol tartrate (LOPRESSOR) 100 MG tablet TAKE 1 TABLET (100 MG TOTAL) BY MOUTH 2 (TWO) TIMES DAILY. 60 tablet 2   nitroGLYCERIN (NITROSTAT) 0.4 MG SL tablet Place 1 tablet (0.4 mg total) under the tongue every 5 (five) minutes as needed for chest pain. 25 tablet 2   pantoprazole (  PROTONIX) 40 MG tablet Take 1 tablet (40 mg total) by mouth daily. 30 tablet 0   traMADol (ULTRAM) 50 MG tablet Take 1 tablet (50 mg total) by mouth every 6 (six) hours as needed. 30 tablet 0   No current facility-administered medications for this encounter.    Physical Findings: The patient is in no acute distress. Patient is alert and oriented.  weight is 209 lb (94.8 kg). Her temperature is 97 F (36.1 C) (abnormal). Her blood pressure is 133/79 and her pulse is 72. Her respiration is 20 and oxygen saturation is 100%. .  No significant changes. Lungs are clear to auscultation bilaterally. Heart has regular rate and rhythm. No palpable cervical, supraclavicular, or axillary adenopathy. Abdomen  soft, non-tender, normal bowel sounds.  Pelvic exam deferred in light of recent completion of treatment.   Lab Findings: Lab Results  Component Value Date   WBC 3.4 (L) 07/17/2021   HGB 8.5 (L) 07/17/2021   HCT 25.7 (L) 07/17/2021   MCV 97.0 07/17/2021   PLT 148 (L) 07/17/2021    Radiographic Findings: No results found.  Impression:   The encounter diagnosis was papillary serous adenocarcinoma of endometrium ([place]).   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  She continues to have some fatigue at this point related to her chemotherapy.  She continues to have some peripheral neuropathy but hopefully this will improve over time.  Plan: Today the patient was given a vaginal dilator and instructions on its use.  Patient will meet with Dr. Berline Lopes in approximately 2 months and radiation oncology in 5 months which will be 6 months out from her radiation therapy.  She will also undergo repeat scanning later this month to be seen by Dr. Alvy Bimler for review of scans.   15 minutes of total time was spent for this patient encounter, including preparation, face-to-face counseling with the patient and coordination of care, physical exam, and documentation of the encounter. ____________________________________  Blair Promise, PhD, MD   This document serves as a record of services personally performed by Gery Pray, MD. It was created on his behalf by Roney Mans, a trained medical scribe. The creation of this record is based on the scribe's personal observations and the provider's statements to them. This document has been checked and approved by the attending provider.

## 2021-09-20 ENCOUNTER — Other Ambulatory Visit: Payer: Self-pay

## 2021-09-20 ENCOUNTER — Ambulatory Visit
Admission: RE | Admit: 2021-09-20 | Discharge: 2021-09-20 | Disposition: A | Discharge status: Home / Self Care | Payer: Self-pay | Source: Ambulatory Visit | Attending: Radiation Oncology | Admitting: Radiation Oncology

## 2021-09-20 ENCOUNTER — Encounter: Payer: Self-pay | Admitting: Hematology and Oncology

## 2021-09-20 ENCOUNTER — Ambulatory Visit: Payer: Self-pay | Attending: Family Medicine | Admitting: Family Medicine

## 2021-09-20 ENCOUNTER — Encounter: Payer: Self-pay | Admitting: Radiation Oncology

## 2021-09-20 VITALS — BP 130/85 | HR 70 | Ht 62.0 in | Wt 204.0 lb

## 2021-09-20 DIAGNOSIS — T451X5A Adverse effect of antineoplastic and immunosuppressive drugs, initial encounter: Secondary | ICD-10-CM

## 2021-09-20 DIAGNOSIS — G629 Polyneuropathy, unspecified: Secondary | ICD-10-CM | POA: Insufficient documentation

## 2021-09-20 DIAGNOSIS — Z7985 Long-term (current) use of injectable non-insulin antidiabetic drugs: Secondary | ICD-10-CM | POA: Insufficient documentation

## 2021-09-20 DIAGNOSIS — G620 Drug-induced polyneuropathy: Secondary | ICD-10-CM

## 2021-09-20 DIAGNOSIS — Z1211 Encounter for screening for malignant neoplasm of colon: Secondary | ICD-10-CM

## 2021-09-20 DIAGNOSIS — R5383 Other fatigue: Secondary | ICD-10-CM | POA: Insufficient documentation

## 2021-09-20 DIAGNOSIS — Z7982 Long term (current) use of aspirin: Secondary | ICD-10-CM | POA: Insufficient documentation

## 2021-09-20 DIAGNOSIS — Z79899 Other long term (current) drug therapy: Secondary | ICD-10-CM | POA: Insufficient documentation

## 2021-09-20 DIAGNOSIS — C541 Malignant neoplasm of endometrium: Secondary | ICD-10-CM | POA: Insufficient documentation

## 2021-09-20 DIAGNOSIS — E1159 Type 2 diabetes mellitus with other circulatory complications: Secondary | ICD-10-CM

## 2021-09-20 DIAGNOSIS — Z1231 Encounter for screening mammogram for malignant neoplasm of breast: Secondary | ICD-10-CM

## 2021-09-20 DIAGNOSIS — R5381 Other malaise: Secondary | ICD-10-CM

## 2021-09-20 DIAGNOSIS — Z923 Personal history of irradiation: Secondary | ICD-10-CM | POA: Insufficient documentation

## 2021-09-20 DIAGNOSIS — Z7984 Long term (current) use of oral hypoglycemic drugs: Secondary | ICD-10-CM | POA: Insufficient documentation

## 2021-09-20 LAB — GLUCOSE, POCT (MANUAL RESULT ENTRY): POC Glucose: 170 mg/dl — AB (ref 70–99)

## 2021-09-20 LAB — POCT GLYCOSYLATED HEMOGLOBIN (HGB A1C): HbA1c, POC (controlled diabetic range): 6.1 % (ref 0.0–7.0)

## 2021-09-20 MED ORDER — GLIMEPIRIDE 4 MG PO TABS
4.0000 mg | ORAL_TABLET | Freq: Every day | ORAL | 6 refills | Status: DC
  Filled 2021-09-20 – 2022-01-14 (×3): qty 30, 30d supply, fill #0
  Filled 2022-03-06: qty 30, 30d supply, fill #1

## 2021-09-20 MED ORDER — GABAPENTIN 300 MG PO CAPS
300.0000 mg | ORAL_CAPSULE | Freq: Two times a day (BID) | ORAL | 3 refills | Status: DC
  Filled 2021-09-20: qty 60, 30d supply, fill #0

## 2021-09-20 NOTE — Progress Notes (Signed)
Numbness and tingling in finger and feet.  Has diarrhea at times.

## 2021-09-20 NOTE — Progress Notes (Signed)
[name] is here today for follow up post radiation to the pelvic.  They completed their radiation on: 08/16/21  Does the patient complain of any of the following:  Pain:Patient denies pain.  Abdominal bloating: no Diarrhea/Constipation: Diarrhea, patient taking Imodium as needed which is effective.  Nausea/Vomiting: Patient reports having nausea.  Vaginal Discharge: no Blood in Urine or Stool: no Urinary Issues (dysuria/incomplete emptying/ incontinence/ increased frequency/urgency): Patient reports having urinary urgency.  Does patient report using vaginal dilator 2-3 times a week and/or sexually active 2-3 weeks: Vaginal Dilators with instructions given at this visit. Patient voiced understanding.  Post radiation skin changes: no   Additional comments if applicable: Vitals:   18/29/93 1452 09/20/21 1453  BP: 133/79 133/79  Pulse: 72 72  Resp: 20 20  Temp: (!) 97 F (36.1 C) (!) 97 F (36.1 C)  SpO2: 100% 100%  Weight: 209 lb (94.8 kg) 209 lb (94.8 kg)

## 2021-09-20 NOTE — Patient Instructions (Signed)
Peripheral Neuropathy ?Peripheral neuropathy is a type of nerve damage. It affects nerves that carry signals between the spinal cord and the arms, legs, and the rest of the body (peripheral nerves). It does not affect nerves in the spinal cord or brain. In peripheral neuropathy, one nerve or a group of nerves may be damaged. Peripheral neuropathy is a broad category that includes many specific nerve disorders, like diabetic neuropathy, hereditary neuropathy, and carpal tunnel syndrome. ?What are the causes? ?This condition may be caused by: ?Diabetes. This is the most common cause of peripheral neuropathy. ?Nerve injury. ?Pressure or stress on a nerve that lasts a long time. ?Lack (deficiency) of B vitamins. This can result from alcoholism, poor diet, or a restricted diet. ?Infections. ?Autoimmune diseases, such as rheumatoid arthritis and systemic lupus erythematosus. ?Nerve diseases that are passed from parent to child (inherited). ?Some medicines, such as cancer medicines (chemotherapy). ?Poisonous (toxic) substances, such as lead and mercury. ?Too little blood flowing to the legs. ?Kidney disease. ?Thyroid disease. ?In some cases, the cause of this condition is not known. ?What are the signs or symptoms? ?Symptoms of this condition depend on which of your nerves is damaged. Common symptoms include: ?Loss of feeling (numbness) in the feet, hands, or both. ?Tingling in the feet, hands, or both. ?Burning pain. ?Very sensitive skin. ?Weakness. ?Not being able to move a part of the body (paralysis). ?Muscle twitching. ?Clumsiness or poor coordination. ?Loss of balance. ?Not being able to control your bladder. ?Feeling dizzy. ?Sexual problems. ?How is this diagnosed? ?Diagnosing and finding the cause of peripheral neuropathy can be difficult. Your health care provider will take your medical history and do a physical exam. A neurological exam will also be done. This involves checking things that are affected by your  brain, spinal cord, and nerves (nervous system). For example, your health care provider will check your reflexes, how you move, and what you can feel. ?You may have other tests, such as: ?Blood tests. ?Electromyogram (EMG) and nerve conduction tests. These tests check nerve function and how well the nerves are controlling the muscles. ?Imaging tests, such as CT scans or MRI to rule out other causes of your symptoms. ?Removing a small piece of nerve to be examined in a lab (nerve biopsy). ?Removing and examining a small amount of the fluid that surrounds the brain and spinal cord (lumbar puncture). ?How is this treated? ?Treatment for this condition may involve: ?Treating the underlying cause of the neuropathy, such as diabetes, kidney disease, or vitamin deficiencies. ?Stopping medicines that can cause neuropathy, such as chemotherapy. ?Medicine to help relieve pain. Medicines may include: ?Prescription or over-the-counter pain medicine. ?Antiseizure medicine. ?Antidepressants. ?Pain-relieving patches that are applied to painful areas of skin. ?Surgery to relieve pressure on a nerve or to destroy a nerve that is causing pain. ?Physical therapy to help improve movement and balance. ?Devices to help you move around (assistive devices). ?Follow these instructions at home: ?Medicines ?Take over-the-counter and prescription medicines only as told by your health care provider. Do not take any other medicines without first asking your health care provider. ?Do not drive or use heavy machinery while taking prescription pain medicine. ?Lifestyle ? ?Do not use any products that contain nicotine or tobacco, such as cigarettes and e-cigarettes. Smoking keeps blood from reaching damaged nerves. If you need help quitting, ask your health care provider. ?Avoid or limit alcohol. Too much alcohol can cause a vitamin B deficiency, and vitamin B is needed for healthy nerves. ?Eat a   healthy diet. This includes: ?Eating foods that are  high in fiber, such as fresh fruits and vegetables, whole grains, and beans. ?Limiting foods that are high in fat and processed sugars, such as fried or sweet foods. ?General instructions ? ?If you have diabetes, work closely with your health care provider to keep your blood sugar under control. ?If you have numbness in your feet: ?Check every day for signs of injury or infection. Watch for redness, warmth, and swelling. ?Wear padded socks and comfortable shoes. These help protect your feet. ?Develop a good support system. Living with peripheral neuropathy can be stressful. Consider talking with a mental health specialist or joining a support group. ?Use assistive devices and attend physical therapy as told by your health care provider. This may include using a walker or a cane. ?Keep all follow-up visits as told by your health care provider. This is important. ?Contact a health care provider if: ?You have new signs or symptoms of peripheral neuropathy. ?You are struggling emotionally from dealing with peripheral neuropathy. ?Your pain is not well-controlled. ?Get help right away if: ?You have an injury or infection that is not healing normally. ?You develop new weakness in an arm or leg. ?You have fallen or do so frequently. ?Summary ?Peripheral neuropathy is when the nerves in the arms, or legs are damaged, resulting in numbness, weakness, or pain. ?There are many causes of peripheral neuropathy, including diabetes, pinched nerves, vitamin deficiencies, autoimmune disease, and hereditary conditions. ?Diagnosing and finding the cause of peripheral neuropathy can be difficult. Your health care provider will take your medical history, do a physical exam, and do tests, including blood tests and nerve function tests. ?Treatment involves treating the underlying cause of the neuropathy and taking medicines to help control pain. Physical therapy and assistive devices may also help. ?This information is not intended to  replace advice given to you by your health care provider. Make sure you discuss any questions you have with your health care provider. ?Document Revised: 09/12/2020 Document Reviewed: 09/12/2020 ?[employer] Patient Education ? 2022 [employer] ? ?

## 2021-09-20 NOTE — Progress Notes (Signed)
Subjective:  Patient ID: [name], female    DOB: November 18, 1956  Age: 65 y.o. MRN: [id number]  CC: Diabetes   HPI [name] is a 65 y.o. year old female with a history of type 2 diabetes mellitus (A1c 6.1), dyslipidemia, coronary artery disease status post  DES stent (in 08/2015 completed anticoagulation with Brilinta and aspirin for 12 months, restarted on Plavix by cardiology due to multiple stents and occlusion from recent cardiac cath), diastolic heart failure (EF> 55-60%, LVH, grade 1 DD, moderate hypokinesis of left ventricular basal septal wall and inferior wall from 02/2021), essential hypertension, COVID-19 is 11/2020 s/p MAB infusion, Adenocarcinoma of Endometrium status post hysterectomy, status post radiation and chemo)    Interval History: Has upcoming appointment with Dr. Luretha Rued of radiation oncology.  Her last visit with medical oncology was in 07/2021 and she was scheduled for repeat CT abdomen and pelvis with contrast. She also had a visit to the cardiology PA 6 weeks ago  Complains of numbness in the tips of her fingers for the last couple of months. Also has had Diarrhea since she had Chemo. It is not everyday. Can occur for 3 days then resolve. She uses Imodium for her symptoms.  Currently compliant with her diabetic regimen but her daughter who accompanies her today complains of the patient loves to eat sweets.  She states she has a good appetite. 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  Dr Gery Pray   Hyperlipidemia LDL goal < 70 06/07/2013   Hypertension    Obesity    S/P coronary artery stent placement to RCA with PROMUS DES,  06/07/13, residual disease on OM1, OM2 and rPDA 06/07/2013    Past Surgical History:  Procedure Laterality Date   CARDIAC CATHETERIZATION N/A 08/22/2015   Procedure: Left Heart Cath and Coronary Angiography;  Surgeon: Leonie Man, MD;  Location: [place] [facility];  Service: Cardiovascular;  Laterality: N/A;   CARDIAC CATHETERIZATION N/A 08/22/2015   Procedure: Coronary Stent Intervention;  Surgeon: Leonie Man, MD;  Location: [place] [facility];  Service: Cardiovascular;  Laterality: N/A;   CARDIAC CATHETERIZATION N/A 08/22/2015   Procedure: Left Heart Cath and Coronary Angiography;  Surgeon: Leonie Man, MD;  Location: [place] [facility];  Service: Cardiovascular;  Laterality: N/A;   CORONARY STENT PLACEMENT  2006   TAXUS stent CFX in [facility]   IR IMAGING GUIDED PORT INSERTION  02/20/2021   LEFT HEART CATH AND CORONARY ANGIOGRAPHY N/A 07/21/2018   Procedure: LEFT HEART CATH AND CORONARY ANGIOGRAPHY;  Surgeon: Troy Sine, MD;  Location: [place] [facility];  Service: Cardiovascular;  Laterality: N/A;   LEFT HEART CATHETERIZATION WITH CORONARY ANGIOGRAM N/A 06/07/2013   Procedure: LEFT HEART CATHETERIZATION WITH CORONARY ANGIOGRAM;  Surgeon: Sanda Klein, MD;  Location: [place] [facility];  Service: Cardiovascular;  Laterality: N/A;   LYMPH NODE DISSECTION N/A 01/29/2021   Procedure: SELECTIVE LYMPH NODE DISSECTION;  Surgeon: Lafonda Mosses, MD;  Location: [facility];  Service: Gynecology;  Laterality: N/A;   ROBOTIC ASSISTED TOTAL HYSTERECTOMY WITH BILATERAL SALPINGO OOPHERECTOMY Bilateral 01/29/2021   Procedure: XI ROBOTIC ASSISTED TOTAL HYSTERECTOMY  WITH BILATERAL SALPINGO OOPHORECTOMY,;  Surgeon: Lafonda Mosses, MD;  Location: [facility];  Service: Gynecology;  Laterality: Bilateral;   SENTINEL NODE BIOPSY N/A 01/29/2021   Procedure: SENTINEL NODE BIOPSY;  Surgeon: Lafonda Mosses, MD;  Location: [facility];  Service: Gynecology;  Laterality: N/A;    Family History  Problem Relation  Age of Onset   Diabetes Mother    Hypertension Mother    Diabetes Sister    Diabetes Brother    Hypertension Brother    Colon cancer Father    Breast cancer Neg Hx    Ovarian cancer Neg Hx    Endometrial cancer Neg Hx    Prostate cancer Neg Hx    Pancreatic cancer Neg Hx     No Known Allergies  Outpatient Medications Prior to Visit  Medication Sig Dispense Refill   aspirin 81 MG EC tablet Take 1 tablet (81 mg total) by mouth daily. 30 tablet 12   atorvastatin (LIPITOR) 80 MG tablet TAKE 1 TABLET (80 MG TOTAL) BY MOUTH AT BEDTIME. 30 tablet 6   Dulaglutide (TRULICITY) 1.5 CB/4.4HQ SOPN Inject 1.5 mg into the skin once a week. 2 mL 2   furosemide (LASIX) 20 MG tablet Take 2 tablets (40 mg total) by mouth daily. 60 tablet 2   glimepiride (AMARYL) 4 MG tablet TAKE 2 TABLETS (8 MG TOTAL) BY MOUTH DAILY WITH BREAKFAST. 60 tablet 6   isosorbide mononitrate (IMDUR) 30 MG 24 hr tablet Take 2 tablets (60 mg total) by mouth daily. 60 tablet 2   lisinopril (ZESTRIL) 20 MG tablet TAKE 1 TABLET BY MOUTH ONCE A DAY 30 tablet 2   metFORMIN (GLUCOPHAGE) 500 MG tablet Take 2 tablets (1,000 mg total) by mouth 2 (two) times daily with a meal. 120 tablet 2   metoprolol tartrate (LOPRESSOR) 100 MG tablet TAKE 1 TABLET (100 MG TOTAL) BY MOUTH 2 (TWO) TIMES DAILY. 60 tablet 2   nitroGLYCERIN (NITROSTAT) 0.4 MG SL tablet Place 1 tablet (0.4 mg total) under the tongue every 5 (five) minutes as needed for chest pain. 25 tablet 2   pantoprazole (PROTONIX) 40 MG tablet Take 1 tablet (40 mg total) by mouth daily. 30 tablet 0   acetaminophen (TYLENOL) 500 MG tablet Take 1 tablet (500 mg total) by mouth every 6 (six) hours as needed. (Patient taking differently: Take 500 mg by mouth every 6 (six) hours as needed for mild pain or headache.) 30 tablet 0   bismuth subsalicylate (PEPTO BISMOL) 262 MG/15ML suspension Take 30 mLs by mouth every 6 (six) hours as needed for indigestion or diarrhea or loose stools.      traMADol (ULTRAM) 50 MG tablet Take 1 tablet (50 mg total) by mouth every 6 (six) hours as needed. 30 tablet 0   No facility-administered medications prior to visit.     ROS Review of Systems  Constitutional:  Negative for activity change, appetite change and fatigue.  HENT:  Negative for congestion, sinus pressure and sore throat.   Eyes:  Negative for visual disturbance.  Respiratory:  Negative for cough, chest tightness, shortness of breath and wheezing.   Cardiovascular:  Negative for chest pain and palpitations.  Gastrointestinal:  Positive for diarrhea. Negative for abdominal distention, abdominal pain and constipation.  Endocrine: Negative for polydipsia.  Genitourinary:  Negative for dysuria and frequency.  Musculoskeletal:  Negative for arthralgias and back pain.  Skin:  Negative for rash.  Neurological:  Positive for numbness. Negative for tremors and light-headedness.  Hematological:  Does not bruise/bleed easily.  Psychiatric/Behavioral:  Negative for agitation and behavioral problems.    Objective:  BP 130/85   Pulse 70   Ht 5\' 2"  (1.575 m)   Wt 204 lb (92.5 kg)   SpO2 100%   BMI 37.31 kg/m   BP/Weight 09/20/2021 08/10/2021 2/37/6283  Systolic BP 151 761 607  Diastolic BP 85 76 60  Wt. (Lbs) 204 204 206.8  BMI 37.31 37.31 36.63      Physical Exam Constitutional:      Appearance: She is well-developed.  Cardiovascular:     Rate and Rhythm: Normal rate.     Heart sounds: Normal heart sounds. No murmur heard. Pulmonary:     Effort: Pulmonary effort is normal.     Breath sounds: Normal breath sounds. No wheezing or rales.  Chest:     Chest wall: No tenderness.  Abdominal:     General: Bowel sounds are normal. There is no distension.     Palpations: Abdomen is soft. There is no mass.     Tenderness: There is no abdominal tenderness.  Musculoskeletal:        General: Normal range of motion.     Right lower leg: No edema.     Left lower leg: No edema.   Neurological:     Mental Status: She is alert and oriented to person, place, and time.  Psychiatric:        Mood and Affect: Mood normal.    CMP Latest Ref Rng & Units 07/17/2021 07/11/2021 07/10/2021  Glucose 70 - 99 mg/dL 138(H) 117(H) 176(H)  BUN 8 - 23 mg/dL 17 13 15   Creatinine 0.44 - 1.00 mg/dL 1.07(H) 1.01(H) 0.99  Sodium 135 - 145 mmol/L 143 140 134(L)  Potassium 3.5 - 5.1 mmol/L 3.6 4.0 3.3(L)  Chloride 98 - 111 mmol/L 108 107 102  CO2 22 - 32 mmol/L 24 24 23   Calcium 8.9 - 10.3 mg/dL 9.5 9.4 8.8(L)  Total Protein 6.5 - 8.1 g/dL 7.4 6.5 -  Total Bilirubin 0.3 - 1.2 mg/dL 0.5 0.6 -  Alkaline Phos 38 - 126 U/L 86 85 -  AST 15 - 41 U/L 17 19 -  ALT 0 - 44 U/L 17 18 -    Lipid Panel     Component Value Date/Time   CHOL 111 01/18/2020 1050   TRIG 146 01/18/2020 1050   HDL 35 (L) 01/18/2020 1050   CHOLHDL 3.2 01/18/2020 1050   CHOLHDL 2.4 02/11/2017 1002   VLDL 18 02/11/2017 1002   LDLCALC 51 01/18/2020 1050    CBC    Component Value Date/Time   WBC 3.4 (L) 07/17/2021 1009   WBC 3.1 (L) 07/11/2021 0545   RBC 2.65 (L) 07/17/2021 1009   HGB 8.5 (L) 07/17/2021 1009   HCT 25.7 (L) 07/17/2021 1009   PLT 148 (L) 07/17/2021 1009   MCV 97.0 07/17/2021 1009   MCH 32.1 07/17/2021 1009   MCHC 33.1 07/17/2021 1009   RDW 17.6 (H) 07/17/2021 1009   LYMPHSABS 1.5 07/17/2021 1009   MONOABS 0.3 07/17/2021 1009   EOSABS 0.0 07/17/2021 1009   BASOSABS 0.0 07/17/2021 1009    Lab Results  Component Value Date   HGBA1C 6.1 09/20/2021    Assessment & Plan:  1. Type 2 diabetes mellitus with other circulatory complication, without long-term current use of insulin (HCC) Controlled with A1c of 6.1; goal is less than 7.0 Concerned about risk of hypoglycemia hence glimepiride has been decreased from 8 mg to  4 mg Counseled on Diabetic diet, my plate method, 161 minutes of moderate intensity exercise/week Blood sugar logs with fasting goals of 80-120 mg/dl, random of less than 180  and in the event of sugars less than 60 mg/dl or greater than 400 mg/dl encouraged to notify the clinic. Advised on the need for annual eye exams, annual foot exams, Pneumonia vaccine. - POCT glucose (manual entry) - POCT glycosylated hemoglobin (Hb A1C) - Lipid panel - Microalbumin / creatinine urine ratio - glimepiride (AMARYL) 4 MG tablet; Take 1 tablet (4 mg total) by mouth daily with breakfast.  Dispense: 30 tablet; Refill: 6  2. Encounter for screening mammogram for malignant neoplasm of breast - MM 3D SCREEN BREAST BILATERAL; Future  3. Screening for colon cancer - Fecal occult blood, imunochemical([facility])  4. Physical deconditioning Secondary to chemo and radiation She will benefit from aquatic therapy Advised on beginning a regular exercise regimen if you minutes a day - Ambulatory referral to Physical Therapy  5. 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 - gabapentin (NEURONTIN) 300 MG capsule; Take 1 capsule (300 mg total) by mouth 2 (two) times daily.  Dispense: 60 capsule; Refill: 3    No orders of the defined types were placed in this encounter.   Return in about 6 months (around 03/21/2022) for Medical conditions.       [name], MD, FAAFP. [facility], [place]   09/20/2021, 11:27 AM

## 2021-09-21 ENCOUNTER — Other Ambulatory Visit: Payer: Self-pay

## 2021-09-21 ENCOUNTER — Encounter: Payer: Self-pay | Admitting: Family Medicine

## 2021-09-21 LAB — LIPID PANEL
Chol/HDL Ratio: 2.7 ratio (ref 0.0–4.4)
Cholesterol, Total: 153 mg/dL (ref 100–199)
HDL: 56 mg/dL (ref >39)
LDL Chol Calc (NIH): 79 mg/dL (ref 0–99)
Triglycerides: 101 mg/dL (ref 0–149)
VLDL Cholesterol Cal: 18 mg/dL (ref 5–40)

## 2021-09-21 LAB — MICROALBUMIN / CREATININE URINE RATIO
Creatinine, Urine: 98.3 mg/dL
Microalb/Creat Ratio: 70 mg/g creat — ABNORMAL HIGH (ref 0–29)
Microalbumin, Urine: 69.1 ug/mL

## 2021-09-25 ENCOUNTER — Other Ambulatory Visit: Payer: Self-pay

## 2021-09-27 ENCOUNTER — Other Ambulatory Visit: Payer: Self-pay

## 2021-09-27 LAB — FECAL OCCULT BLOOD, IMMUNOCHEMICAL: Fecal Occult Bld: NEGATIVE

## 2021-09-28 ENCOUNTER — Ambulatory Visit (HOSPITAL_COMMUNITY)
Admission: RE | Admit: 2021-09-28 | Discharge: 2021-09-28 | Disposition: A | Discharge status: Home / Self Care | Payer: Self-pay | Source: Ambulatory Visit | Attending: Hematology and Oncology | Admitting: Hematology and Oncology

## 2021-09-28 ENCOUNTER — Inpatient Hospital Stay: Payer: Self-pay | Attending: Gynecologic Oncology

## 2021-09-28 ENCOUNTER — Other Ambulatory Visit: Payer: Self-pay

## 2021-09-28 DIAGNOSIS — E119 Type 2 diabetes mellitus without complications: Secondary | ICD-10-CM | POA: Insufficient documentation

## 2021-09-28 DIAGNOSIS — I10 Essential (primary) hypertension: Secondary | ICD-10-CM | POA: Insufficient documentation

## 2021-09-28 DIAGNOSIS — D6481 Anemia due to antineoplastic chemotherapy: Secondary | ICD-10-CM | POA: Insufficient documentation

## 2021-09-28 DIAGNOSIS — C541 Malignant neoplasm of endometrium: Secondary | ICD-10-CM | POA: Insufficient documentation

## 2021-09-28 DIAGNOSIS — T451X5A Adverse effect of antineoplastic and immunosuppressive drugs, initial encounter: Secondary | ICD-10-CM | POA: Insufficient documentation

## 2021-09-28 DIAGNOSIS — D61818 Other pancytopenia: Secondary | ICD-10-CM | POA: Insufficient documentation

## 2021-09-28 DIAGNOSIS — I509 Heart failure, unspecified: Secondary | ICD-10-CM | POA: Insufficient documentation

## 2021-09-28 LAB — CMP (CANCER CENTER ONLY)
ALT: 17 U/L (ref 0–44)
AST: 18 U/L (ref 15–41)
Albumin: 3.3 g/dL — ABNORMAL LOW (ref 3.5–5.0)
Alkaline Phosphatase: 111 U/L (ref 38–126)
Anion gap: 8 (ref 5–15)
BUN: 20 mg/dL (ref 8–23)
CO2: 25 mmol/L (ref 22–32)
Calcium: 9 mg/dL (ref 8.9–10.3)
Chloride: 105 mmol/L (ref 98–111)
Creatinine: 1.02 mg/dL — ABNORMAL HIGH (ref 0.44–1.00)
GFR, Estimated: >60 mL/min (ref >60)
Glucose, Bld: 129 mg/dL — ABNORMAL HIGH (ref 70–99)
Potassium: 4.3 mmol/L (ref 3.5–5.1)
Sodium: 138 mmol/L (ref 135–145)
Total Bilirubin: 0.4 mg/dL (ref 0.3–1.2)
Total Protein: 7.3 g/dL (ref 6.5–8.1)

## 2021-09-28 LAB — CBC WITH DIFFERENTIAL (CANCER CENTER ONLY)
Abs Immature Granulocytes: 0.03 10*3/uL (ref 0.00–0.07)
Basophils Absolute: 0 10*3/uL (ref 0.0–0.1)
Basophils Relative: 0 %
Eosinophils Absolute: 0.1 10*3/uL (ref 0.0–0.5)
Eosinophils Relative: 1 %
HCT: 30.7 % — ABNORMAL LOW (ref 36.0–46.0)
Hemoglobin: 9.9 g/dL — ABNORMAL LOW (ref 12.0–15.0)
Immature Granulocytes: 0 %
Lymphocytes Relative: 26 %
Lymphs Abs: 1.8 10*3/uL (ref 0.7–4.0)
MCH: 32 pg (ref 26.0–34.0)
MCHC: 32.2 g/dL (ref 30.0–36.0)
MCV: 99.4 fL (ref 80.0–100.0)
Monocytes Absolute: 0.5 10*3/uL (ref 0.1–1.0)
Monocytes Relative: 7 %
Neutro Abs: 4.5 10*3/uL (ref 1.7–7.7)
Neutrophils Relative %: 66 %
Platelet Count: 224 10*3/uL (ref 150–400)
RBC: 3.09 MIL/uL — ABNORMAL LOW (ref 3.87–5.11)
RDW: 14.4 % (ref 11.5–15.5)
WBC Count: 6.9 10*3/uL (ref 4.0–10.5)
nRBC: 0 % (ref 0.0–0.2)

## 2021-09-28 MED ORDER — HEPARIN SOD (PORK) LOCK FLUSH 100 UNIT/ML IV SOLN
INTRAVENOUS | Status: AC
  Administered 2021-09-28: 500 [IU] via INTRAVENOUS
  Filled 2021-09-28: qty 5

## 2021-09-28 MED ORDER — SODIUM CHLORIDE 0.9% FLUSH
10.0000 mL | Freq: Once | INTRAVENOUS | Status: AC
  Administered 2021-09-28: 10 mL

## 2021-09-28 MED ORDER — HEPARIN SOD (PORK) LOCK FLUSH 100 UNIT/ML IV SOLN: 500.0000 [IU] | Freq: Once | INTRAVENOUS | Status: AC

## 2021-09-28 MED ORDER — IOHEXOL 350 MG/ML SOLN
80.0000 mL | Freq: Once | INTRAVENOUS | Status: AC | PRN
  Administered 2021-09-28: 100 mL via INTRAVENOUS

## 2021-10-01 ENCOUNTER — Encounter: Payer: Self-pay | Admitting: Hematology and Oncology

## 2021-10-01 ENCOUNTER — Other Ambulatory Visit: Payer: Self-pay

## 2021-10-01 ENCOUNTER — Inpatient Hospital Stay (HOSPITAL_BASED_OUTPATIENT_CLINIC_OR_DEPARTMENT_OTHER): Payer: Self-pay | Admitting: Hematology and Oncology

## 2021-10-01 DIAGNOSIS — C541 Malignant neoplasm of endometrium: Secondary | ICD-10-CM

## 2021-10-01 DIAGNOSIS — I5032 Chronic diastolic (congestive) heart failure: Secondary | ICD-10-CM

## 2021-10-01 DIAGNOSIS — D6481 Anemia due to antineoplastic chemotherapy: Secondary | ICD-10-CM

## 2021-10-01 DIAGNOSIS — T451X5A Adverse effect of antineoplastic and immunosuppressive drugs, initial encounter: Secondary | ICD-10-CM

## 2021-10-01 NOTE — Progress Notes (Signed)
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[facility]) I have reviewed several CT imaging with the patient and her daughter Unfortunately, there are signs of lymphadenopathy in her pelvis suspicious for cancer relapse The locations of these lymph nodes are in close proximity to vasculature; the size of these lymph nodes are small and I do not feel strongly that PET CT scan is going to be helpful It is impossible to obtain biopsy in those locations Due to lack of symptoms, I recommend repeat CT imaging in 3 months, due around mid January 2023 I plan to see her again in 6 weeks for further follow-up with repeat blood work and port flushes  Anemia due to antineoplastic chemotherapy She has severe pancytopenia with treatment Her leukopenia and thrombocytopenia had resolved She has persistent mild anemia but I anticipate it would improve in the near future I will see her again in 6 weeks we will repeat blood work and follow-up  Chronic diastolic heart failure ([place]) She has significant cardiovascular comorbidities including obesity class II, diabetes, hypertension and others And noticed that the patient has gained tremendous amount of weight recently since last time I saw her Clinically, she does not appear to have congestive heart failure We discussed importance of aggressive management of risk factors in case she needs to be started back on treatment in the future  No orders of the defined types were placed in this encounter.   All questions were answered. The patient knows to call the clinic with any problems, questions or concerns. The total time spent in the appointment was 30 minutes encounter with patients including review of chart and various tests results, discussions about plan of care and  coordination of care plan   Heath Lark, MD 10/01/2021 2:11 PM  INTERVAL HISTORY: Please see below for problem oriented charting. she returns for follow-up and review of imaging studies with her daughter She had history of uterine cancer and completed chemotherapy and adjuvant radiation therapy Denies recent exacerbation of congestive heart failure No recent infection Denies abdominal pain or changes in bowel habits  REVIEW OF SYSTEMS:   Constitutional: Denies fevers, chills or abnormal weight loss Eyes: Denies blurriness of vision Ears, nose, mouth, throat, and face: Denies mucositis or sore throat Respiratory: Denies cough, dyspnea or wheezes Cardiovascular: Denies palpitation, chest discomfort or lower extremity swelling Gastrointestinal:  Denies nausea, heartburn or change in bowel habits Skin: Denies abnormal skin rashes Lymphatics: Denies new lymphadenopathy or easy bruising Neurological:Denies numbness, tingling or new weaknesses Behavioral/Psych: Mood is stable, no new changes  All other systems were reviewed with the patient and are negative.  I have reviewed the past medical history, past surgical history, social history and family history with the patient and they are unchanged from previous note.  ALLERGIES:  has No Known Allergies.  MEDICATIONS:  Current Outpatient Medications  Medication Sig Dispense Refill   acetaminophen (TYLENOL) 500 MG tablet Take 1 tablet (500 mg total) by mouth every 6 (six) hours as needed. (Patient taking differently: Take 500 mg by mouth every 6 (six) hours as needed for mild pain or headache.) 30 tablet 0   aspirin 81 MG EC tablet Take 1 tablet (81 mg total) by mouth daily. 30 tablet 12   atorvastatin (LIPITOR) 80 MG tablet TAKE 1 TABLET (80 MG TOTAL) BY MOUTH AT  BEDTIME. 30 tablet 6   bismuth subsalicylate (PEPTO BISMOL) 262 MG/15ML suspension Take 30 mLs by mouth every 6 (six) hours as needed for indigestion or diarrhea or loose stools.      Dulaglutide (TRULICITY) 1.5 WI/0.9BD SOPN Inject 1.5 mg into the skin once a week. 2 mL 2   furosemide (LASIX) 20 MG tablet Take 2 tablets (40 mg total) by mouth daily. 60 tablet 2   gabapentin (NEURONTIN) 300 MG capsule Take 1 capsule (300 mg total) by mouth 2 (two) times daily. 60 capsule 3   glimepiride (AMARYL) 4 MG tablet Take 1 tablet (4 mg total) by mouth daily with breakfast. 30 tablet 6   isosorbide mononitrate (IMDUR) 30 MG 24 hr tablet Take 2 tablets (60 mg total) by mouth daily. 60 tablet 2   lisinopril (ZESTRIL) 20 MG tablet TAKE 1 TABLET BY MOUTH ONCE A DAY 30 tablet 2   metFORMIN (GLUCOPHAGE) 500 MG tablet Take 2 tablets (1,000 mg total) by mouth 2 (two) times daily with a meal. 120 tablet 2   metoprolol tartrate (LOPRESSOR) 100 MG tablet TAKE 1 TABLET (100 MG TOTAL) BY MOUTH 2 (TWO) TIMES DAILY. 60 tablet 2   nitroGLYCERIN (NITROSTAT) 0.4 MG SL tablet Place 1 tablet (0.4 mg total) under the tongue every 5 (five) minutes as needed for chest pain. 25 tablet 2   pantoprazole (PROTONIX) 40 MG tablet Take 1 tablet (40 mg total) by mouth daily. 30 tablet 0   traMADol (ULTRAM) 50 MG tablet Take 1 tablet (50 mg total) by mouth every 6 (six) hours as needed. 30 tablet 0   No current facility-administered medications for this visit.    SUMMARY OF ONCOLOGIC HISTORY: Oncology History Overview Note  Pap 12/14/20: adenocarcinoma, HPV negative  HER-2 positive by FISH Her-2 equivocal by [facility]  MS[facility] stable    Papillary serous adenocarcinoma of endometrium ([place])   Initial Diagnosis   Endometrial carcinoma ([place])   01/12/2021 Initial Biopsy   A. ENDOCERVIX, CURETTAGE:  - Adenocarcinoma.  B. ENDOMETRIUM, BIOPSY:  - Adenocarcinoma.  COMMENT:  The differential diagnosis includes high grade serous carcinoma.  Both  specimens have a similar morphology; high grade serous carcinoma more  commonly arises in the endometrium.  Results reported to Dr. Hale Bogus  on 01/15/2021.  Dr. Saralyn Pilar  reviewed the case.    01/16/2021 Tumor Marker   Patient's tumor was tested for the following markers: CA-125 Results of the tumor marker test revealed normal value, 10.7   01/25/2021 Imaging   CT C/A/P: 1. Small volume fluid in the endometrial canal with 7 mm hypoattenuating lesion in the myometrium of the anterior fundus. 2. No definite evidence for metastatic disease in the chest, abdomen, or pelvis. Small lymph nodes are noted along both pelvic sidewalls. Attention on follow-up recommended. 3. 5 mm ground-glass opacity peripheral left upper lobe. This is likely related to infection/inflammation and potentially scar. Attention on surveillance imaging recommended. 4. 5.8 cm lesion posterior lower uterine segment likely a fibroid. Ultrasound exam from 01/28/2010 demonstrated a 5.2 cm lesion in the left aspect of the posterior lower uterine body. 5. Aortic Atherosclerosis (ICD10-I70.0).   01/29/2021 Surgery   TRH/BSO, SLN biopsy left, selective pelvic LND right, omentectomy  Findings: On EUA, 10-12cm enlarged mobile uterus. On intra-abdomina entry, minimal adhesions between the liver and anterior abdomen on the right. Some changes c/w fatty liver on the left. Omentum, stomach, small and large bowel all grossly normal. Bilateral ovaries normal appearing. Uterus with 5-6cm posterior fibroid, otherwise  normal appearing. No mapping to right pelvis. On left, mapping to the level of the superior vessel artery, enlarged lymph node noted (likely sentinel) within the upper aspect of the obturator space. In bilateral pelvic basins, multiple enlarged lymph nodes. On the right, enlarged lymph nodes extended superiorly along the common iliac vessels. At the end of surgery, no obvious abdominal or pelvic evidence of disease.   01/29/2021 Pathology Results   A. SENTINEL LYMPH NODE, LEFT INTERNAL ILIAC, BIOPSY:  - Metastatic carcinoma in (1) of (1) lymph node.   B. SENTINEL LYMPH NODE, LEFT OBTURATOR,  BIOPSY:  - Metastatic carcinoma in (1) of (1) lymph node.   C. LYMPH NODES, LEFT PELVIC, DISSECTION:  - Metastatic carcinoma in (1) of (3) lymph nodes.   D. LYMPH NODE, RIGHT EXTERNAL AND COMMON ILIAC, BIOPSY:  - Metastatic carcinoma in (1) of (1) lymph node.   E. UTERUS, CERVIX AND BILATERAL FALLOPIAN TUBES AND OVARIES, TOTAL  HYSTERECTOMY AND BILATERAL SALPINGO-OOPHORECTOMY:  - High grade serous carcinoma of endometrium, with invasion more than  half of the myometrium.  - Tumor invades the stromal connective tissue of the cervix.  - No involvement of uterine serosa or adnexa.  - Lymphovascular invasion is identified.  - See oncology table.   F. OMENTUM, OMENTECTOMY:  - Omentum, negative for carcinoma.   G. LYMPH NODES, RIGHT PELVIC, DISSECTION:  - Two lymph nodes, negative for carcinoma (0/2).   ONCOLOGY TABLE:   UTERUS, CARCINOMA OR CARCINOSARCOMA: Resection   Procedure: Total hysterectomy and bilateral salpingo-oophorectomy,  Omentectomy, Lymph node sampling  Histologic Type: Serous carcinoma  Histologic Grade: High grade  Myometrial Invasion: > 50%  Uterine Serosa Involvement: Not identified  Cervical Stroma Involvement: Present  Other Tissue/Organ Involvement: Not identified  Peritoneal/Ascitic Fluid: Negative for carcinoma  Lymphovascular Invasion: Present  Regional Lymph Nodes:       Pelvic Lymph Nodes Examined:            2 Sentinel            6 Non-Sentinel            8 Total       Pelvic Lymph Nodes with Metastasis: 4            Macrometastasis: 3            Micrometastasis: 1            Isolated Tumor Cells: 0            Laterality of Lymph Nodes with Tumor: Right (non-sentinel),  Left (sentinel and non-sentinel)            Extracapsular Extension: Present       Para-Aortic Lymph Nodes Examined: Not applicable        Para-Aortic Lymph Nodes with Metastasis: Not applicable  Distant Metastasis:       Distant Site(s) Involved: Omentum: Not involved   Pathologic Stage Classification (pTNM, AJCC 8th Edition): pT2, pN1a  Ancillary Studies: HER2 will be ordered  Additional Findings: Leiomyomata.  Adenomyosis.  Representative Tumor Block: B1  Comment: Dr. Saralyn Pilar reviewed select slides.  (v4.2.0.1)    01/29/2021 Cancer Staging   Staging form: Corpus Uteri - Carcinoma and Carcinosarcoma, AJCC 8th Edition - Clinical stage from 01/29/2021: FIGO Stage IIIC1 (cT1b, cN1a, cM0) - Signed by Lafonda Mosses, MD on 02/02/2021 Histopathologic type: Mixed cell adenocarcinoma Stage prefix: Initial diagnosis Method of lymph node assessment: Other Histologic grade (G): G3 Histologic grading system: 3 grade system Lymph-vascular invasion (LVI): LVI  present/identified, NOS Peritoneal cytology results: Negative Pelvic nodal status: Positive Number of pelvic nodes positive from dissection: 4 Number of pelvic nodes examined during dissection: 8 Para-aortic status: Not assessed Lymph node metastasis: Present Omentectomy performed: Yes Morcellation performed: No   02/19/2021 Echocardiogram    1. Left ventricular ejection fraction, by estimation, is 55 to 60%. The left ventricle has normal function. The left ventricle demonstrates regional wall motion abnormalities (see scoring diagram/findings for description). There is mild left ventricular  hypertrophy. Left ventricular diastolic parameters are consistent with Grade I diastolic dysfunction (impaired relaxation). There is moderate hypokinesis of the left ventricular, basal septal wall and inferior wall. The average left ventricular global longitudinal strain is -17.9 %. The global longitudinal strain is abnormal.  2. Right ventricular systolic function is normal. The right ventricular size is normal. There is normal pulmonary artery systolic pressure. The estimated right ventricular systolic pressure is 86.5 mmHg.  3. The mitral valve is abnormal. Trivial mitral valve regurgitation.  4. The aortic valve is  tricuspid. Aortic valve regurgitation is not visualized.  5. The inferior vena cava is normal in size with greater than 50% respiratory variability, suggesting right atrial pressure of 3 mmHg.   02/20/2021 Procedure   Successful placement of a right IJ approach Power Port with ultrasound and fluoroscopic guidance. The catheter is ready for use.   02/28/2021 - 06/14/2021 Chemotherapy    Patient is on Treatment Plan: UTERINE CARBOPLATIN AUC 6 / PACLITAXEL Q21D       04/20/2021 Imaging   Status post hysterectomy and suspected bilateral salpingo-oophorectomy.   2.2 cm fluid density lesion along the left pelvic sidewall likely reflects a postoperative seroma.   No findings suspicious for recurrent or metastatic disease.   07/16/2021 - 08/16/2021 Radiation Therapy   Radiation Treatment Dates: 07/16/2021 through 08/16/2021 Site Technique Total Dose (Gy) Dose per Fx (Gy) Completed Fx Beam Energies  Vagina: Pelvis HDR-brachy 30/30 6 5/5 Ir-192        10/01/2021 Imaging   Increasing size of LEFT pelvic lymph nodes, largest approximately 11 mm along the external iliac chain. Given findings and history could consider PET for further evaluation as warranted.   Cystic area along the LEFT pelvic sidewall that was seen previously has improved.   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   Aortic Atherosclerosis (ICD10-I70.0).     PHYSICAL EXAMINATION: ECOG PERFORMANCE STATUS: 1 - Symptomatic but completely ambulatory  Vitals:   10/01/21 1220  BP: (!) 144/67  Pulse: 66  Resp: 18  Temp: 98 F (36.7 C)  SpO2: 100%   Filed Weights   10/01/21 1220  Weight: 221 lb 3.2 oz (100.3 kg)    GENERAL:alert, no distress and comfortable NEURO: alert & oriented x 3 with fluent speech, no focal motor/sensory deficits  LABORATORY DATA:  I have reviewed the data as listed    Component Value Date/Time   NA 138  09/28/2021 1145   NA 143 10/18/2020 1028   K 4.3 09/28/2021 1145   CL 105 09/28/2021 1145   CO2 25 09/28/2021 1145   GLUCOSE 129 (H) 09/28/2021 1145   BUN 20 09/28/2021 1145   BUN 13 10/18/2020 1028   CREATININE 1.02 (H) 09/28/2021 1145   CREATININE 0.79 02/11/2017 0943   CALCIUM 9.0 09/28/2021 1145   PROT 7.3 09/28/2021 1145   PROT 6.7 10/18/2020 1028   ALBUMIN 3.3 (L) 09/28/2021 1145   ALBUMIN 4.1 10/18/2020  1028   AST 18 09/28/2021 1145   ALT 17 09/28/2021 1145   ALKPHOS 111 09/28/2021 1145   BILITOT 0.4 09/28/2021 1145   GFRNONAA >60 09/28/2021 1145   GFRNONAA 83 02/11/2017 0943   GFRAA 70 10/18/2020 1028   GFRAA >89 02/11/2017 0943    No results found for: SPEP, UPEP  Lab Results  Component Value Date   WBC 6.9 09/28/2021   NEUTROABS 4.5 09/28/2021   HGB 9.9 (L) 09/28/2021   HCT 30.7 (L) 09/28/2021   MCV 99.4 09/28/2021   PLT 224 09/28/2021      Chemistry      Component Value Date/Time   NA 138 09/28/2021 1145   NA 143 10/18/2020 1028   K 4.3 09/28/2021 1145   CL 105 09/28/2021 1145   CO2 25 09/28/2021 1145   BUN 20 09/28/2021 1145   BUN 13 10/18/2020 1028   CREATININE 1.02 (H) 09/28/2021 1145   CREATININE 0.79 02/11/2017 0943      Component Value Date/Time   CALCIUM 9.0 09/28/2021 1145   ALKPHOS 111 09/28/2021 1145   AST 18 09/28/2021 1145   ALT 17 09/28/2021 1145   BILITOT 0.4 09/28/2021 1145       RADIOGRAPHIC STUDIES: I have reviewed multiple imaging studies with the patient and her daughter I have personally reviewed the radiological images as listed and agreed with the findings in the report. CT ABDOMEN PELVIS W CONTRAST  Result Date: 10/01/2021 CLINICAL DATA:  Uterine/cervical cancer, assess treatment response in a 65 year old female. EXAM: CT ABDOMEN AND PELVIS WITH CONTRAST TECHNIQUE: Multidetector CT imaging of the abdomen and pelvis was performed using the standard protocol following bolus administration of intravenous contrast.  CONTRAST:  158m OMNIPAQUE IOHEXOL 350 MG/ML SOLN COMPARISON:  Apr 20, 2021. FINDINGS: Lower chest: Signs of coronary artery disease and previous percutaneous coronary intervention. Central venous access device terminates at the upper aspect of the RIGHT atrium. Hepatobiliary: No focal, suspicious hepatic lesion. No pericholecystic stranding. No biliary duct dilation. Portal vein is patent. Pancreas: Normal, without mass, inflammation or ductal dilatation. 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 Spleen: Lobular splenic contour. 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 Adrenals/Urinary Tract: Adrenal glands are unremarkable. Symmetric renal enhancement. No sign of hydronephrosis. No suspicious renal lesion or perinephric stranding. Urinary bladder is grossly unremarkable. Stomach/Bowel: No acute gastrointestinal process. Appendix is normal. Colonic diverticulosis as before. Vascular/Lymphatic: Aortic atherosclerosis. No sign of aneurysm. Smooth contour of the IVC. There is no gastrohepatic or hepatoduodenal ligament lymphadenopathy. No retroperitoneal or mesenteric lymphadenopathy. No pelvic sidewall lymphadenopathy. Reproductive: Post hysterectomy. Cystic area along the LEFT pelvic sidewall that was seen previously has improved. Increased density along the course of the iliac vasculature in the LEFT pelvis since previous imaging, suspect 11 mm lymph node at the site of a previous subcentimeter lymph node approximately 6 mm. (Image 71/2) LEFT lateral common iliac lymph node (image 59/2) 8 mm size. 10 mm lymph node (image 67/2) LEFT hypogastric lymph node. Other: No ascites.  Small fat containing umbilical hernia. Musculoskeletal: Spinal degenerative changes. No acute or destructive bone process. IMPRESSION: Increasing size of LEFT pelvic lymph nodes,  largest approximately 11 mm along the external iliac chain. Given findings and history could consider PET for further evaluation as warranted. Cystic area along the LEFT pelvic sidewall that was seen previously has improved. Chronic occlusion or narrowing  of the splenic vein with associated collateral pathways in the upper abdomen similar to prior imaging. Aortic Atherosclerosis (ICD10-I70.0). Electronically Signed   By: Zetta Bills M.D.   On: 10/01/2021 10:05

## 2021-10-01 NOTE — Assessment & Plan Note (Signed)
She has severe pancytopenia with treatment Her leukopenia and thrombocytopenia had resolved She has persistent mild anemia but I anticipate it would improve in the near future I will see her again in 6 weeks we will repeat blood work and follow-up

## 2021-10-01 NOTE — Assessment & Plan Note (Signed)
She has significant cardiovascular comorbidities including obesity class II, diabetes, hypertension and others And noticed that the patient has gained tremendous amount of weight recently since last time I saw her Clinically, she does not appear to have congestive heart failure We discussed importance of aggressive management of risk factors in case she needs to be started back on treatment in the future

## 2021-10-01 NOTE — Assessment & Plan Note (Signed)
I have reviewed several CT imaging with the patient and her daughter Unfortunately, there are signs of lymphadenopathy in her pelvis suspicious for cancer relapse The locations of these lymph nodes are in close proximity to vasculature; the size of these lymph nodes are small and I do not feel strongly that PET CT scan is going to be helpful It is impossible to obtain biopsy in those locations Due to lack of symptoms, I recommend repeat CT imaging in 3 months, due around mid January 2023 I plan to see her again in 6 weeks for further follow-up with repeat blood work and port flushes

## 2021-10-04 ENCOUNTER — Inpatient Hospital Stay: Payer: Self-pay

## 2021-10-09 ENCOUNTER — Ambulatory Visit: Payer: Self-pay | Attending: Family Medicine | Admitting: Physical Therapy

## 2021-10-30 ENCOUNTER — Other Ambulatory Visit: Payer: Self-pay

## 2021-10-30 ENCOUNTER — Encounter: Payer: Self-pay | Admitting: Hematology and Oncology

## 2021-10-31 ENCOUNTER — Other Ambulatory Visit: Payer: Self-pay

## 2021-11-12 ENCOUNTER — Inpatient Hospital Stay: Payer: Self-pay | Attending: Gynecologic Oncology

## 2021-11-12 ENCOUNTER — Other Ambulatory Visit: Payer: Self-pay

## 2021-11-12 ENCOUNTER — Inpatient Hospital Stay (HOSPITAL_BASED_OUTPATIENT_CLINIC_OR_DEPARTMENT_OTHER): Payer: Self-pay | Admitting: Hematology and Oncology

## 2021-11-12 VITALS — BP 145/72 | HR 73 | Temp 98.0°F | Resp 18 | Ht 62.0 in | Wt 227.2 lb

## 2021-11-12 DIAGNOSIS — C541 Malignant neoplasm of endometrium: Secondary | ICD-10-CM | POA: Insufficient documentation

## 2021-11-12 DIAGNOSIS — D6481 Anemia due to antineoplastic chemotherapy: Secondary | ICD-10-CM | POA: Insufficient documentation

## 2021-11-12 DIAGNOSIS — T451X5A Adverse effect of antineoplastic and immunosuppressive drugs, initial encounter: Secondary | ICD-10-CM

## 2021-11-12 DIAGNOSIS — R531 Weakness: Secondary | ICD-10-CM | POA: Insufficient documentation

## 2021-11-12 DIAGNOSIS — Z7984 Long term (current) use of oral hypoglycemic drugs: Secondary | ICD-10-CM | POA: Insufficient documentation

## 2021-11-12 DIAGNOSIS — N183 Chronic kidney disease, stage 3 unspecified: Secondary | ICD-10-CM | POA: Insufficient documentation

## 2021-11-12 DIAGNOSIS — Z9221 Personal history of antineoplastic chemotherapy: Secondary | ICD-10-CM | POA: Insufficient documentation

## 2021-11-12 DIAGNOSIS — Z923 Personal history of irradiation: Secondary | ICD-10-CM | POA: Insufficient documentation

## 2021-11-12 DIAGNOSIS — Z7982 Long term (current) use of aspirin: Secondary | ICD-10-CM | POA: Insufficient documentation

## 2021-11-12 DIAGNOSIS — Z90710 Acquired absence of both cervix and uterus: Secondary | ICD-10-CM | POA: Insufficient documentation

## 2021-11-12 DIAGNOSIS — Z90722 Acquired absence of ovaries, bilateral: Secondary | ICD-10-CM | POA: Insufficient documentation

## 2021-11-12 DIAGNOSIS — Z79899 Other long term (current) drug therapy: Secondary | ICD-10-CM | POA: Insufficient documentation

## 2021-11-12 LAB — CMP (CANCER CENTER ONLY)
ALT: 13 U/L (ref 0–44)
AST: 12 U/L — ABNORMAL LOW (ref 15–41)
Albumin: 3.4 g/dL — ABNORMAL LOW (ref 3.5–5.0)
Alkaline Phosphatase: 119 U/L (ref 38–126)
Anion gap: 10 (ref 5–15)
BUN: 26 mg/dL — ABNORMAL HIGH (ref 8–23)
CO2: 26 mmol/L (ref 22–32)
Calcium: 9.5 mg/dL (ref 8.9–10.3)
Chloride: 105 mmol/L (ref 98–111)
Creatinine: 1.14 mg/dL — ABNORMAL HIGH (ref 0.44–1.00)
GFR, Estimated: 54 mL/min — ABNORMAL LOW (ref >60)
Glucose, Bld: 152 mg/dL — ABNORMAL HIGH (ref 70–99)
Potassium: 4.3 mmol/L (ref 3.5–5.1)
Sodium: 141 mmol/L (ref 135–145)
Total Bilirubin: 0.3 mg/dL (ref 0.3–1.2)
Total Protein: 7.5 g/dL (ref 6.5–8.1)

## 2021-11-12 LAB — CBC WITH DIFFERENTIAL (CANCER CENTER ONLY)
Abs Immature Granulocytes: 0.01 10*3/uL (ref 0.00–0.07)
Basophils Absolute: 0 10*3/uL (ref 0.0–0.1)
Basophils Relative: 0 %
Eosinophils Absolute: 0.1 10*3/uL (ref 0.0–0.5)
Eosinophils Relative: 1 %
HCT: 33.6 % — ABNORMAL LOW (ref 36.0–46.0)
Hemoglobin: 11 g/dL — ABNORMAL LOW (ref 12.0–15.0)
Immature Granulocytes: 0 %
Lymphocytes Relative: 31 %
Lymphs Abs: 1.8 10*3/uL (ref 0.7–4.0)
MCH: 30.6 pg (ref 26.0–34.0)
MCHC: 32.7 g/dL (ref 30.0–36.0)
MCV: 93.6 fL (ref 80.0–100.0)
Monocytes Absolute: 0.5 10*3/uL (ref 0.1–1.0)
Monocytes Relative: 8 %
Neutro Abs: 3.5 10*3/uL (ref 1.7–7.7)
Neutrophils Relative %: 60 %
Platelet Count: 283 10*3/uL (ref 150–400)
RBC: 3.59 MIL/uL — ABNORMAL LOW (ref 3.87–5.11)
RDW: 13.5 % (ref 11.5–15.5)
WBC Count: 5.8 10*3/uL (ref 4.0–10.5)
nRBC: 0 % (ref 0.0–0.2)

## 2021-11-12 MED ORDER — SODIUM CHLORIDE 0.9% FLUSH
10.0000 mL | Freq: Once | INTRAVENOUS | Status: AC
  Administered 2021-11-12: 12:00:00 10 mL

## 2021-11-12 MED ORDER — HEPARIN SOD (PORK) LOCK FLUSH 100 UNIT/ML IV SOLN
500.0000 [IU] | Freq: Once | INTRAVENOUS | Status: AC
  Administered 2021-11-12: 12:00:00 500 [IU]

## 2021-11-14 ENCOUNTER — Encounter: Payer: Self-pay | Admitting: Hematology and Oncology

## 2021-11-14 NOTE — Assessment & Plan Note (Signed)
She has gained a lot of weight since last time I saw her She has class III with multiple cardiovascular risk factors I spent some time educating the patient and family the importance of weight loss due to association between class III obesity with higher risk of cancer recurrence

## 2021-11-14 NOTE — Progress Notes (Signed)
[facility] OFFICE PROGRESS NOTE  [name] Care Team: [name], MD as PCP - General (Family Medicine) Croitoru, Dani Gobble, MD as PCP - Cardiology (Cardiology)  ASSESSMENT & PLAN:  Papillary serous adenocarcinoma of endometrium [facility]) I spent some time explaining to the [name] and daughter the rationale of careful observation with plan to repeat CT imaging in January Her daughter thought that she should go back on treatment I explained to her that the [name] is weak and frail and the lymphadenopathy, though suspicious, not conclusive signs of disease This treatment break would allow her to recover from side effects of treatment and work on other health issues including dietary modification and weight loss I have given her instructions to schedule CT imaging in January and I will see her back for further discussion about plan of care  Anemia due to antineoplastic chemotherapy Her anemia has improved with treatment Observe closely I suspect her persistent anemia is due to anemia of chronic disease  CKD (chronic kidney disease), stage III ([place]) This is stable We discussed the importance of risk factor modification, dietary changes and weight loss  Obesity, Class III, BMI 40-49.9 (morbid obesity) ([place]) She has gained a lot of weight since last time I saw her She has class III with multiple cardiovascular risk factors I spent some time educating the [name] and family the importance of weight loss due to association between class III obesity with higher risk of cancer recurrence   Orders Placed This Encounter  Procedures   CT ABDOMEN PELVIS W CONTRAST    Standing Status:   Future    Standing Expiration Date:   11/12/2022    Order Specific Question:   If indicated for the ordered procedure, I authorize the administration of contrast media per Radiology protocol    Answer:   Yes    Order Specific Question:   Preferred imaging location?    Answer:   [facility]     Order Specific Question:   Radiology Contrast Protocol - do NOT remove file path    Answer:   [email or web address]    All questions were answered. The [name] knows to call the clinic with any problems, questions or concerns. The total time spent in the appointment was 30 minutes encounter with patients including review of chart and various tests results, discussions about plan of care and coordination of care plan   Heath Lark, MD 11/14/2021 10:01 AM  INTERVAL HISTORY: Please see below for problem oriented charting. she returns for surveillance follow-up with her daughter Since last time I saw her, she has gained a lot of weight She denies recent exacerbation of congestive heart failure Denies abdominal pain or changes in her bowel habits  REVIEW OF SYSTEMS:   Constitutional: Denies fevers, chills or abnormal weight loss Eyes: Denies blurriness of vision Ears, nose, mouth, throat, and face: Denies mucositis or sore throat Respiratory: Denies cough, dyspnea or wheezes Cardiovascular: Denies palpitation, chest discomfort or lower extremity swelling Gastrointestinal:  Denies nausea, heartburn or change in bowel habits Skin: Denies abnormal skin rashes Lymphatics: Denies new lymphadenopathy or easy bruising Neurological:Denies numbness, tingling or new weaknesses Behavioral/Psych: Mood is stable, no new changes  All other systems were reviewed with the [name] and are negative.  I have reviewed the past medical history, past surgical history, social history and family history with the [name] and they are unchanged from previous note.  ALLERGIES:  has No Known Allergies.  MEDICATIONS:  Current Outpatient Medications  Medication Sig Dispense  Refill   aspirin 81 MG EC tablet Take 1 tablet (81 mg total) by mouth daily. 30 tablet 12   atorvastatin (LIPITOR) 80 MG tablet TAKE 1 TABLET (80 MG TOTAL) BY MOUTH AT BEDTIME. 30 tablet 6   Dulaglutide  (TRULICITY) 1.5 CB/6.3AG SOPN Inject 1.5 mg into the skin once a week. 2 mL 2   furosemide (LASIX) 20 MG tablet Take 2 tablets (40 mg total) by mouth daily. 60 tablet 2   gabapentin (NEURONTIN) 300 MG capsule Take 1 capsule (300 mg total) by mouth 2 (two) times daily. 60 capsule 3   glimepiride (AMARYL) 4 MG tablet Take 1 tablet (4 mg total) by mouth daily with breakfast. 30 tablet 6   isosorbide mononitrate (IMDUR) 30 MG 24 hr tablet Take 2 tablets (60 mg total) by mouth daily. 60 tablet 2   lisinopril (ZESTRIL) 20 MG tablet TAKE 1 TABLET BY MOUTH ONCE A DAY 30 tablet 2   metFORMIN (GLUCOPHAGE) 500 MG tablet Take 2 tablets (1,000 mg total) by mouth 2 (two) times daily with a meal. 120 tablet 2   metoprolol tartrate (LOPRESSOR) 100 MG tablet TAKE 1 TABLET (100 MG TOTAL) BY MOUTH 2 (TWO) TIMES DAILY. 60 tablet 2   nitroGLYCERIN (NITROSTAT) 0.4 MG SL tablet Place 1 tablet (0.4 mg total) under the tongue every 5 (five) minutes as needed for chest pain. 25 tablet 2   pantoprazole (PROTONIX) 40 MG tablet Take 1 tablet (40 mg total) by mouth daily. 30 tablet 0   No current facility-administered medications for this visit.    SUMMARY OF ONCOLOGIC HISTORY: Oncology History Overview Note  Pap 12/14/20: adenocarcinoma, HPV negative  HER-2 positive by FISH Her-2 equivocal by [facility]  [facility] stable    Papillary serous adenocarcinoma of endometrium ([place])   Initial Diagnosis   Endometrial carcinoma ([place])   01/12/2021 Initial Biopsy   A. ENDOCERVIX, CURETTAGE:  - Adenocarcinoma.  B. ENDOMETRIUM, BIOPSY:  - Adenocarcinoma.  COMMENT:  The differential diagnosis includes high grade serous carcinoma.  Both  specimens have a similar morphology; high grade serous carcinoma more  commonly arises in the endometrium.  Results reported to Dr. Hale Bogus  on 01/15/2021.  Dr. Saralyn Pilar reviewed the case.    01/16/2021 Tumor Marker   [name]'s tumor was tested for the following markers: CA-125 Results of the tumor  marker test revealed normal value, 10.7   01/25/2021 Imaging   CT C/A/P: 1. Small volume fluid in the endometrial canal with 7 mm hypoattenuating lesion in the myometrium of the anterior fundus. 2. No definite evidence for metastatic disease in the chest, abdomen, or pelvis. Small lymph nodes are noted along both pelvic sidewalls. Attention on follow-up recommended. 3. 5 mm ground-glass opacity peripheral left upper lobe. This is likely related to infection/inflammation and potentially scar. Attention on surveillance imaging recommended. 4. 5.8 cm lesion posterior lower uterine segment likely a fibroid. Ultrasound exam from 01/28/2010 demonstrated a 5.2 cm lesion in the left aspect of the posterior lower uterine body. 5. Aortic Atherosclerosis (ICD10-I70.0).   01/29/2021 Surgery   TRH/BSO, SLN biopsy left, selective pelvic LND right, omentectomy  Findings: On EUA, 10-12cm enlarged mobile uterus. On intra-abdomina entry, minimal adhesions between the liver and anterior abdomen on the right. Some changes c/w fatty liver on the left. Omentum, stomach, small and large bowel all grossly normal. Bilateral ovaries normal appearing. Uterus with 5-6cm posterior fibroid, otherwise normal appearing. No mapping to right pelvis. On left, mapping to the level of the superior vessel  artery, enlarged lymph node noted (likely sentinel) within the upper aspect of the obturator space. In bilateral pelvic basins, multiple enlarged lymph nodes. On the right, enlarged lymph nodes extended superiorly along the common iliac vessels. At the end of surgery, no obvious abdominal or pelvic evidence of disease.   01/29/2021 Pathology Results   A. SENTINEL LYMPH NODE, LEFT INTERNAL ILIAC, BIOPSY:  - Metastatic carcinoma in (1) of (1) lymph node.   B. SENTINEL LYMPH NODE, LEFT OBTURATOR, BIOPSY:  - Metastatic carcinoma in (1) of (1) lymph node.   C. LYMPH NODES, LEFT PELVIC, DISSECTION:  - Metastatic carcinoma in (1)  of (3) lymph nodes.   D. LYMPH NODE, RIGHT EXTERNAL AND COMMON ILIAC, BIOPSY:  - Metastatic carcinoma in (1) of (1) lymph node.   E. UTERUS, CERVIX AND BILATERAL FALLOPIAN TUBES AND OVARIES, TOTAL  HYSTERECTOMY AND BILATERAL SALPINGO-OOPHORECTOMY:  - High grade serous carcinoma of endometrium, with invasion more than  half of the myometrium.  - Tumor invades the stromal connective tissue of the cervix.  - No involvement of uterine serosa or adnexa.  - Lymphovascular invasion is identified.  - See oncology table.   F. OMENTUM, OMENTECTOMY:  - Omentum, negative for carcinoma.   G. LYMPH NODES, RIGHT PELVIC, DISSECTION:  - Two lymph nodes, negative for carcinoma (0/2).   ONCOLOGY TABLE:   UTERUS, CARCINOMA OR CARCINOSARCOMA: Resection   Procedure: Total hysterectomy and bilateral salpingo-oophorectomy,  Omentectomy, Lymph node sampling  Histologic Type: Serous carcinoma  Histologic Grade: High grade  Myometrial Invasion: > 50%  Uterine Serosa Involvement: Not identified  Cervical Stroma Involvement: Present  Other Tissue/Organ Involvement: Not identified  Peritoneal/Ascitic Fluid: Negative for carcinoma  Lymphovascular Invasion: Present  Regional Lymph Nodes:       Pelvic Lymph Nodes Examined:            2 Sentinel            6 Non-Sentinel            8 Total       Pelvic Lymph Nodes with Metastasis: 4            Macrometastasis: 3            Micrometastasis: 1            Isolated Tumor Cells: 0            Laterality of Lymph Nodes with Tumor: Right (non-sentinel),  Left (sentinel and non-sentinel)            Extracapsular Extension: Present       Para-Aortic Lymph Nodes Examined: Not applicable        Para-Aortic Lymph Nodes with Metastasis: Not applicable  Distant Metastasis:       Distant Site(s) Involved: Omentum: Not involved  Pathologic Stage Classification (pTNM, AJCC 8th Edition): pT2, pN1a  Ancillary Studies: HER2 will be ordered  Additional Findings:  Leiomyomata.  Adenomyosis.  Representative Tumor Block: B1  Comment: Dr. Saralyn Pilar reviewed select slides.  (v4.2.0.1)    01/29/2021 Cancer Staging   Staging form: Corpus Uteri - Carcinoma and Carcinosarcoma, AJCC 8th Edition - Clinical stage from 01/29/2021: FIGO Stage IIIC1 (cT1b, cN1a, cM0) - Signed by Lafonda Mosses, MD on 02/02/2021 Histopathologic type: Mixed cell adenocarcinoma Stage prefix: Initial diagnosis Method of lymph node assessment: Other Histologic grade (G): G3 Histologic grading system: 3 grade system Lymph-vascular invasion (LVI): LVI present/identified, NOS Peritoneal cytology results: Negative Pelvic nodal status: Positive Number of pelvic nodes positive from dissection:  4 Number of pelvic nodes examined during dissection: 8 Para-aortic status: Not assessed Lymph node metastasis: Present Omentectomy performed: Yes Morcellation performed: No    02/19/2021 Echocardiogram    1. Left ventricular ejection fraction, by estimation, is 55 to 60%. The left ventricle has normal function. The left ventricle demonstrates regional wall motion abnormalities (see scoring diagram/findings for description). There is mild left ventricular  hypertrophy. Left ventricular diastolic parameters are consistent with Grade I diastolic dysfunction (impaired relaxation). There is moderate hypokinesis of the left ventricular, basal septal wall and inferior wall. The average left ventricular global longitudinal strain is -17.9 %. The global longitudinal strain is abnormal.  2. Right ventricular systolic function is normal. The right ventricular size is normal. There is normal pulmonary artery systolic pressure. The estimated right ventricular systolic pressure is 53.6 mmHg.  3. The mitral valve is abnormal. Trivial mitral valve regurgitation.  4. The aortic valve is tricuspid. Aortic valve regurgitation is not visualized.  5. The inferior vena cava is normal in size with greater than 50%  respiratory variability, suggesting right atrial pressure of 3 mmHg.   02/20/2021 Procedure   Successful placement of a right IJ approach Power Port with ultrasound and fluoroscopic guidance. The catheter is ready for use.   02/28/2021 - 06/14/2021 Chemotherapy    [name] is on Treatment Plan: UTERINE CARBOPLATIN AUC 6 / PACLITAXEL Q21D       04/20/2021 Imaging   Status post hysterectomy and suspected bilateral salpingo-oophorectomy.   2.2 cm fluid density lesion along the left pelvic sidewall likely reflects a postoperative seroma.   No findings suspicious for recurrent or metastatic disease.   07/16/2021 - 08/16/2021 Radiation Therapy   Radiation Treatment Dates: 07/16/2021 through 08/16/2021 Site Technique Total Dose (Gy) Dose per Fx (Gy) Completed Fx Beam Energies  Vagina: Pelvis HDR-brachy 30/30 6 5/5 Ir-192        10/01/2021 Imaging   Increasing size of LEFT pelvic lymph nodes, largest approximately 11 mm along the external iliac chain. Given findings and history could consider PET for further evaluation as warranted.   Cystic area along the LEFT pelvic sidewall that was seen previously has improved.   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   Aortic Atherosclerosis (ICD10-I70.0).     PHYSICAL EXAMINATION: ECOG PERFORMANCE STATUS: 2 - Symptomatic, <50% confined to bed  Vitals:   11/12/21 1202  BP: (!) 145/72  Pulse: 73  Resp: 18  Temp: 98 F (36.7 C)  SpO2: 98%   Filed Weights   11/12/21 1202  Weight: 227 lb 3.2 oz (103.1 kg)    GENERAL:alert, no distress and comfortable SKIN: skin color, texture, turgor are normal, no rashes or significant lesions EYES: normal, Conjunctiva are pink and non-injected, sclera clear OROPHARYNX:no exudate, no erythema and lips, buccal mucosa, and tongue normal  NECK: supple, thyroid normal size, non-tender, without nodularity LYMPH:  no palpable  lymphadenopathy in the cervical, axillary or inguinal LUNGS: clear to auscultation and percussion with normal breathing effort HEART: regular rate & rhythm and no murmurs and no lower extremity edema ABDOMEN:abdomen soft, non-tender and normal bowel sounds Musculoskeletal:no cyanosis of digits and no clubbing  NEURO: alert & oriented x 3 with fluent speech, no focal motor/sensory deficits  LABORATORY DATA:  I have reviewed the data as listed    Component Value Date/Time   NA 141 11/12/2021 1142   NA 143 10/18/2020 1028   K 4.3 11/12/2021  1142   CL 105 11/12/2021 1142   CO2 26 11/12/2021 1142   GLUCOSE 152 (H) 11/12/2021 1142   BUN 26 (H) 11/12/2021 1142   BUN 13 10/18/2020 1028   CREATININE 1.14 (H) 11/12/2021 1142   CREATININE 0.79 02/11/2017 0943   CALCIUM 9.5 11/12/2021 1142   PROT 7.5 11/12/2021 1142   PROT 6.7 10/18/2020 1028   ALBUMIN 3.4 (L) 11/12/2021 1142   ALBUMIN 4.1 10/18/2020 1028   AST 12 (L) 11/12/2021 1142   ALT 13 11/12/2021 1142   ALKPHOS 119 11/12/2021 1142   BILITOT 0.3 11/12/2021 1142   GFRNONAA 54 (L) 11/12/2021 1142   GFRNONAA 83 02/11/2017 0943   GFRAA 70 10/18/2020 1028   GFRAA >89 02/11/2017 0943    No results found for: SPEP, UPEP  Lab Results  Component Value Date   WBC 5.8 11/12/2021   NEUTROABS 3.5 11/12/2021   HGB 11.0 (L) 11/12/2021   HCT 33.6 (L) 11/12/2021   MCV 93.6 11/12/2021   PLT 283 11/12/2021      Chemistry      Component Value Date/Time   NA 141 11/12/2021 1142   NA 143 10/18/2020 1028   K 4.3 11/12/2021 1142   CL 105 11/12/2021 1142   CO2 26 11/12/2021 1142   BUN 26 (H) 11/12/2021 1142   BUN 13 10/18/2020 1028   CREATININE 1.14 (H) 11/12/2021 1142   CREATININE 0.79 02/11/2017 0943      Component Value Date/Time   CALCIUM 9.5 11/12/2021 1142   ALKPHOS 119 11/12/2021 1142   AST 12 (L) 11/12/2021 1142   ALT 13 11/12/2021 1142   BILITOT 0.3 11/12/2021 1142

## 2021-11-14 NOTE — Assessment & Plan Note (Signed)
This is stable We discussed the importance of risk factor modification, dietary changes and weight loss

## 2021-11-14 NOTE — Assessment & Plan Note (Signed)
I spent some time explaining to the patient and daughter the rationale of careful observation with plan to repeat CT imaging in January Her daughter thought that she should go back on treatment I explained to her that the patient is weak and frail and the lymphadenopathy, though suspicious, not conclusive signs of disease This treatment break would allow her to recover from side effects of treatment and work on other health issues including dietary modification and weight loss I have given her instructions to schedule CT imaging in January and I will see her back for further discussion about plan of care

## 2021-11-14 NOTE — Assessment & Plan Note (Signed)
Her anemia has improved with treatment Observe closely I suspect her persistent anemia is due to anemia of chronic disease

## 2021-12-07 ENCOUNTER — Other Ambulatory Visit: Payer: Self-pay

## 2021-12-20 ENCOUNTER — Telehealth: Payer: Self-pay | Admitting: *Deleted

## 2021-12-20 NOTE — Telephone Encounter (Signed)
Shirley from radiation called and scheduled the patient for a follow up appt. Appt scheduled and Enid Derry will contact the patient

## 2021-12-20 NOTE — Telephone Encounter (Signed)
CALLED PATIENT TO INFORM OF FU APPT. WITH DR. Berline [name] ON 01-10-22 - ARRIVAL TIME- 1:30 PM, LVM FOR A RETURN CALL

## 2021-12-21 ENCOUNTER — Ambulatory Visit (HOSPITAL_COMMUNITY)
Admission: RE | Admit: 2021-12-21 | Discharge: 2021-12-21 | Disposition: A | Discharge status: Home / Self Care | Payer: Medicaid Other | Source: Ambulatory Visit | Attending: Hematology and Oncology | Admitting: Hematology and Oncology

## 2021-12-21 ENCOUNTER — Other Ambulatory Visit: Payer: Self-pay

## 2021-12-21 ENCOUNTER — Inpatient Hospital Stay: Payer: Medicaid Other | Attending: Gynecologic Oncology

## 2021-12-21 ENCOUNTER — Encounter (HOSPITAL_COMMUNITY): Payer: Self-pay

## 2021-12-21 DIAGNOSIS — I129 Hypertensive chronic kidney disease with stage 1 through stage 4 chronic kidney disease, or unspecified chronic kidney disease: Secondary | ICD-10-CM | POA: Insufficient documentation

## 2021-12-21 DIAGNOSIS — I509 Heart failure, unspecified: Secondary | ICD-10-CM | POA: Diagnosis not present

## 2021-12-21 DIAGNOSIS — Z79899 Other long term (current) drug therapy: Secondary | ICD-10-CM | POA: Diagnosis not present

## 2021-12-21 DIAGNOSIS — C541 Malignant neoplasm of endometrium: Secondary | ICD-10-CM | POA: Insufficient documentation

## 2021-12-21 DIAGNOSIS — G629 Polyneuropathy, unspecified: Secondary | ICD-10-CM | POA: Insufficient documentation

## 2021-12-21 DIAGNOSIS — N183 Chronic kidney disease, stage 3 unspecified: Secondary | ICD-10-CM | POA: Diagnosis not present

## 2021-12-21 DIAGNOSIS — I700 Atherosclerosis of aorta: Secondary | ICD-10-CM | POA: Insufficient documentation

## 2021-12-21 DIAGNOSIS — Z5111 Encounter for antineoplastic chemotherapy: Secondary | ICD-10-CM | POA: Diagnosis present

## 2021-12-21 DIAGNOSIS — Z9221 Personal history of antineoplastic chemotherapy: Secondary | ICD-10-CM | POA: Insufficient documentation

## 2021-12-21 DIAGNOSIS — Z923 Personal history of irradiation: Secondary | ICD-10-CM | POA: Diagnosis not present

## 2021-12-21 LAB — CBC WITH DIFFERENTIAL (CANCER CENTER ONLY)
Abs Immature Granulocytes: 0.03 10*3/uL (ref 0.00–0.07)
Basophils Absolute: 0 10*3/uL (ref 0.0–0.1)
Basophils Relative: 0 %
Eosinophils Absolute: 0.1 10*3/uL (ref 0.0–0.5)
Eosinophils Relative: 1 %
HCT: 32.3 % — ABNORMAL LOW (ref 36.0–46.0)
Hemoglobin: 10.6 g/dL — ABNORMAL LOW (ref 12.0–15.0)
Immature Granulocytes: 1 %
Lymphocytes Relative: 31 %
Lymphs Abs: 1.8 10*3/uL (ref 0.7–4.0)
MCH: 29.4 pg (ref 26.0–34.0)
MCHC: 32.8 g/dL (ref 30.0–36.0)
MCV: 89.7 fL (ref 80.0–100.0)
Monocytes Absolute: 0.5 10*3/uL (ref 0.1–1.0)
Monocytes Relative: 8 %
Neutro Abs: 3.4 10*3/uL (ref 1.7–7.7)
Neutrophils Relative %: 59 %
Platelet Count: 287 10*3/uL (ref 150–400)
RBC: 3.6 MIL/uL — ABNORMAL LOW (ref 3.87–5.11)
RDW: 13.8 % (ref 11.5–15.5)
WBC Count: 5.8 10*3/uL (ref 4.0–10.5)
nRBC: 0 % (ref 0.0–0.2)

## 2021-12-21 LAB — CMP (CANCER CENTER ONLY)
ALT: 10 U/L (ref 0–44)
AST: 11 U/L — ABNORMAL LOW (ref 15–41)
Albumin: 3.7 g/dL (ref 3.5–5.0)
Alkaline Phosphatase: 106 U/L (ref 38–126)
Anion gap: 10 (ref 5–15)
BUN: 20 mg/dL (ref 8–23)
CO2: 25 mmol/L (ref 22–32)
Calcium: 9.4 mg/dL (ref 8.9–10.3)
Chloride: 105 mmol/L (ref 98–111)
Creatinine: 1.02 mg/dL — ABNORMAL HIGH (ref 0.44–1.00)
GFR, Estimated: >60 mL/min (ref >60)
Glucose, Bld: 158 mg/dL — ABNORMAL HIGH (ref 70–99)
Potassium: 3.8 mmol/L (ref 3.5–5.1)
Sodium: 140 mmol/L (ref 135–145)
Total Bilirubin: 0.4 mg/dL (ref 0.3–1.2)
Total Protein: 7.4 g/dL (ref 6.5–8.1)

## 2021-12-21 MED ORDER — SODIUM CHLORIDE 0.9% FLUSH
10.0000 mL | Freq: Once | INTRAVENOUS | Status: AC
  Administered 2021-12-21: 10 mL

## 2021-12-21 MED ORDER — SODIUM CHLORIDE (PF) 0.9 % IJ SOLN
INTRAMUSCULAR | Status: AC
  Filled 2021-12-21: qty 50

## 2021-12-21 MED ORDER — HEPARIN SOD (PORK) LOCK FLUSH 100 UNIT/ML IV SOLN
500.0000 [IU] | Freq: Once | INTRAVENOUS | Status: AC
  Administered 2021-12-21: 500 [IU] via INTRAVENOUS

## 2021-12-21 MED ORDER — IOHEXOL 350 MG/ML SOLN
80.0000 mL | Freq: Once | INTRAVENOUS | Status: AC | PRN
  Administered 2021-12-21: 80 mL via INTRAVENOUS

## 2021-12-21 MED ORDER — HEPARIN SOD (PORK) LOCK FLUSH 100 UNIT/ML IV SOLN
INTRAVENOUS | Status: AC
  Filled 2021-12-21: qty 5

## 2021-12-24 ENCOUNTER — Other Ambulatory Visit (HOSPITAL_COMMUNITY): Payer: Self-pay

## 2021-12-24 ENCOUNTER — Inpatient Hospital Stay (HOSPITAL_BASED_OUTPATIENT_CLINIC_OR_DEPARTMENT_OTHER): Payer: Medicaid Other | Admitting: Hematology and Oncology

## 2021-12-24 ENCOUNTER — Encounter: Payer: Self-pay | Admitting: Hematology and Oncology

## 2021-12-24 ENCOUNTER — Other Ambulatory Visit: Payer: Self-pay

## 2021-12-24 VITALS — BP 168/86 | HR 61 | Temp 98.1°F | Resp 18 | Ht 62.0 in | Wt 232.6 lb

## 2021-12-24 DIAGNOSIS — N183 Chronic kidney disease, stage 3 unspecified: Secondary | ICD-10-CM

## 2021-12-24 DIAGNOSIS — I5032 Chronic diastolic (congestive) heart failure: Secondary | ICD-10-CM | POA: Diagnosis not present

## 2021-12-24 DIAGNOSIS — C541 Malignant neoplasm of endometrium: Secondary | ICD-10-CM

## 2021-12-24 DIAGNOSIS — Z5111 Encounter for antineoplastic chemotherapy: Secondary | ICD-10-CM | POA: Diagnosis not present

## 2021-12-24 DIAGNOSIS — I10 Essential (primary) hypertension: Secondary | ICD-10-CM | POA: Diagnosis not present

## 2021-12-24 MED ORDER — PROCHLORPERAZINE MALEATE 10 MG PO TABS
10.0000 mg | ORAL_TABLET | Freq: Four times a day (QID) | ORAL | 1 refills | Status: DC | PRN
  Filled 2021-12-24: qty 30, 8d supply, fill #0
  Filled 2022-03-06: qty 30, 8d supply, fill #1

## 2021-12-24 MED ORDER — DEXAMETHASONE 4 MG PO TABS
ORAL_TABLET | ORAL | 6 refills | Status: DC
  Filled 2021-12-24: qty 36, 90d supply, fill #0

## 2021-12-24 MED ORDER — ONDANSETRON HCL 8 MG PO TABS
8.0000 mg | ORAL_TABLET | Freq: Two times a day (BID) | ORAL | 1 refills | Status: DC | PRN
  Filled 2021-12-24: qty 30, 15d supply, fill #0
  Filled 2022-03-06: qty 30, 15d supply, fill #1

## 2021-12-24 NOTE — Progress Notes (Signed)
[place] OFFICE PROGRESS NOTE  Patient Care Team: [name], MD as PCP - General (Family Medicine) Croitoru, Dani Gobble, MD as PCP - Cardiology (Cardiology)  ASSESSMENT & PLAN:  Papillary serous adenocarcinoma of endometrium [facility]) I have reviewed multiple imaging studies with the patient and her daughter Unfortunately, she has clear signs of disease progression The pattern of progression is highly suggestive of metastatic uterine cancer recurrence I do not believe biopsy is indicated We reviewed the current guidelines and discussed treatment options My preference will be to start her on pembrolizumab/Lenvima but the patient is currently uninsured and I am not able to get her treated with this combination regimen The patient have HER2 positive disease We discussed the role of chemotherapy in a palliative fashion, for repeat treatment with combination carboplatin, paclitaxel and trastuzumab The risk, benefits, side effects were fully discussed and she is in agreement to proceed I plan to order a pretreatment echocardiogram I will order carboplatin AUC of 5 and upfront dose reduction of paclitaxel due to risks of peripheral neuropathy I plan minimum 3 cycles of treatment before repeating imaging study   Chronic diastolic heart failure ([place]) We discussed the risk of congestive heart failure with trastuzumab Her echocardiogram from last year showed preserved ejection fraction We will repeat baseline echocardiogram before we start treatment  CKD (chronic kidney disease), stage III ([place]) We discussed importance of risk factor modification while on treatment  Essential hypertension She has uncontrolled hypertension today but likely due to anxiety We will monitor her blood pressure carefully  Orders Placed This Encounter  Procedures   CBC with Differential ([place] Only)    Standing Status:   Standing    Number of Occurrences:   20    Standing Expiration Date:    12/24/2022   CMP ([place] only)    Standing Status:   Standing    Number of Occurrences:   20    Standing Expiration Date:   12/24/2022   ECHOCARDIOGRAM COMPLETE    Standing Status:   Future    Standing Expiration Date:   12/24/2022    Order Specific Question:   Where should this test be performed    Answer:   [place]    Order Specific Question:   Perflutren DEFINITY (image enhancing agent) should be administered unless hypersensitivity or allergy exist    Answer:   Administer Perflutren    Order Specific Question:   Reason for exam-Echo    Answer:   Chemo  [id number]    All questions were answered. The patient knows to call the clinic with any problems, questions or concerns. The total time spent in the appointment was 40 minutes encounter with patients including review of chart and various tests results, discussions about plan of care and coordination of care plan   Heath Lark, MD 12/24/2021 6:03 PM  INTERVAL HISTORY: Please see below for problem oriented charting. she returns for surveillance follow-up due to high risk uterine cancer She does not have any symptoms of abdominal pain, bloating or changes in bowel habits  REVIEW OF SYSTEMS:   Constitutional: Denies fevers, chills or abnormal weight loss Eyes: Denies blurriness of vision Ears, nose, mouth, throat, and face: Denies mucositis or sore throat Respiratory: Denies cough, dyspnea or wheezes Cardiovascular: Denies palpitation, chest discomfort or lower extremity swelling Gastrointestinal:  Denies nausea, heartburn or change in bowel habits Skin: Denies abnormal skin rashes Lymphatics: Denies new lymphadenopathy or easy bruising Neurological:Denies numbness, tingling or new weaknesses Behavioral/Psych:  Mood is stable, no new changes  All other systems were reviewed with the patient and are negative.  I have reviewed the past medical history, past surgical history, social history and family history with the patient and they  are unchanged from previous note.  ALLERGIES:  has No Known Allergies.  MEDICATIONS:  Current Outpatient Medications  Medication Sig Dispense Refill   dexamethasone (DECADRON) 4 MG tablet Take 2 tablets by mouth the night before and 2 tablets the morning of chemotherapy, every 3 weeks x 6 cycles 36 tablet 6   aspirin 81 MG EC tablet Take 1 tablet (81 mg total) by mouth daily. 30 tablet 12   atorvastatin (LIPITOR) 80 MG tablet TAKE 1 TABLET (80 MG TOTAL) BY MOUTH AT BEDTIME. 30 tablet 6   Dulaglutide (TRULICITY) 1.5 HY/0.7PX SOPN Inject 1.5 mg into the skin once a week. 2 mL 2   furosemide (LASIX) 20 MG tablet Take 2 tablets (40 mg total) by mouth daily. 60 tablet 2   gabapentin (NEURONTIN) 300 MG capsule Take 1 capsule (300 mg total) by mouth 2 (two) times daily. 60 capsule 3   glimepiride (AMARYL) 4 MG tablet Take 1 tablet (4 mg total) by mouth daily with breakfast. 30 tablet 6   isosorbide mononitrate (IMDUR) 30 MG 24 hr tablet Take 2 tablets (60 mg total) by mouth daily. 60 tablet 2   lisinopril (ZESTRIL) 20 MG tablet TAKE 1 TABLET BY MOUTH ONCE A DAY 30 tablet 2   metFORMIN (GLUCOPHAGE) 500 MG tablet Take 2 tablets (1,000 mg total) by mouth 2 (two) times daily with a meal. 120 tablet 2   metoprolol tartrate (LOPRESSOR) 100 MG tablet TAKE 1 TABLET (100 MG TOTAL) BY MOUTH 2 (TWO) TIMES DAILY. 60 tablet 2   nitroGLYCERIN (NITROSTAT) 0.4 MG SL tablet Place 1 tablet (0.4 mg total) under the tongue every 5 (five) minutes as needed for chest pain. 25 tablet 2   ondansetron (ZOFRAN) 8 MG tablet Take 1 tablet by mouth 2  times daily as needed for refractory nausea / vomiting. Start on day 3 after carboplatin chemo. 30 tablet 1   pantoprazole (PROTONIX) 40 MG tablet Take 1 tablet (40 mg total) by mouth daily. 30 tablet 0   prochlorperazine (COMPAZINE) 10 MG tablet Take 1 tablet by mouth every 6  hours as needed (Nausea or vomiting). 30 tablet 1   No current facility-administered medications for this  visit.    SUMMARY OF ONCOLOGIC HISTORY: Oncology History Overview Note  Pap 12/14/20: adenocarcinoma, HPV negative  HER-2 positive by FISH Her-2 equivocal by [facility]  MSI stable    Papillary serous adenocarcinoma of endometrium ([place])   Initial Diagnosis   Endometrial carcinoma ([place])   01/12/2021 Initial Biopsy   A. ENDOCERVIX, CURETTAGE:  - Adenocarcinoma.  B. ENDOMETRIUM, BIOPSY:  - Adenocarcinoma.  COMMENT:  The differential diagnosis includes high grade serous carcinoma.  Both  specimens have a similar morphology; high grade serous carcinoma more  commonly arises in the endometrium.  Results reported to Dr. Hale Bogus  on 01/15/2021.  Dr. Saralyn Pilar reviewed the case.    01/16/2021 Tumor Marker   Patient's tumor was tested for the following markers: CA-125 Results of the tumor marker test revealed normal value, 10.7   01/25/2021 Imaging   CT C/A/P: 1. Small volume fluid in the endometrial canal with 7 mm hypoattenuating lesion in the myometrium of the anterior fundus. 2. No definite evidence for metastatic disease in the chest, abdomen, or pelvis. Small lymph  nodes are noted along both pelvic sidewalls. Attention on follow-up recommended. 3. 5 mm ground-glass opacity peripheral left upper lobe. This is likely related to infection/inflammation and potentially scar. Attention on surveillance imaging recommended. 4. 5.8 cm lesion posterior lower uterine segment likely a fibroid. Ultrasound exam from 01/28/2010 demonstrated a 5.2 cm lesion in the left aspect of the posterior lower uterine body. 5. Aortic Atherosclerosis (ICD10-I70.0).   01/29/2021 Surgery   TRH/BSO, SLN biopsy left, selective pelvic LND right, omentectomy  Findings: On EUA, 10-12cm enlarged mobile uterus. On intra-abdomina entry, minimal adhesions between the liver and anterior abdomen on the right. Some changes c/w fatty liver on the left. Omentum, stomach, small and large bowel all grossly normal. Bilateral  ovaries normal appearing. Uterus with 5-6cm posterior fibroid, otherwise normal appearing. No mapping to right pelvis. On left, mapping to the level of the superior vessel artery, enlarged lymph node noted (likely sentinel) within the upper aspect of the obturator space. In bilateral pelvic basins, multiple enlarged lymph nodes. On the right, enlarged lymph nodes extended superiorly along the common iliac vessels. At the end of surgery, no obvious abdominal or pelvic evidence of disease.   01/29/2021 Pathology Results   A. SENTINEL LYMPH NODE, LEFT INTERNAL ILIAC, BIOPSY:  - Metastatic carcinoma in (1) of (1) lymph node.   B. SENTINEL LYMPH NODE, LEFT OBTURATOR, BIOPSY:  - Metastatic carcinoma in (1) of (1) lymph node.   C. LYMPH NODES, LEFT PELVIC, DISSECTION:  - Metastatic carcinoma in (1) of (3) lymph nodes.   D. LYMPH NODE, RIGHT EXTERNAL AND COMMON ILIAC, BIOPSY:  - Metastatic carcinoma in (1) of (1) lymph node.   E. UTERUS, CERVIX AND BILATERAL FALLOPIAN TUBES AND OVARIES, TOTAL  HYSTERECTOMY AND BILATERAL SALPINGO-OOPHORECTOMY:  - High grade serous carcinoma of endometrium, with invasion more than  half of the myometrium.  - Tumor invades the stromal connective tissue of the cervix.  - No involvement of uterine serosa or adnexa.  - Lymphovascular invasion is identified.  - See oncology table.   F. OMENTUM, OMENTECTOMY:  - Omentum, negative for carcinoma.   G. LYMPH NODES, RIGHT PELVIC, DISSECTION:  - Two lymph nodes, negative for carcinoma (0/2).   ONCOLOGY TABLE:   UTERUS, CARCINOMA OR CARCINOSARCOMA: Resection   Procedure: Total hysterectomy and bilateral salpingo-oophorectomy,  Omentectomy, Lymph node sampling  Histologic Type: Serous carcinoma  Histologic Grade: High grade  Myometrial Invasion: > 50%  Uterine Serosa Involvement: Not identified  Cervical Stroma Involvement: Present  Other Tissue/Organ Involvement: Not identified  Peritoneal/Ascitic Fluid: Negative  for carcinoma  Lymphovascular Invasion: Present  Regional Lymph Nodes:       Pelvic Lymph Nodes Examined:            2 Sentinel            6 Non-Sentinel            8 Total       Pelvic Lymph Nodes with Metastasis: 4            Macrometastasis: 3            Micrometastasis: 1            Isolated Tumor Cells: 0            Laterality of Lymph Nodes with Tumor: Right (non-sentinel),  Left (sentinel and non-sentinel)            Extracapsular Extension: Present       Para-Aortic Lymph Nodes Examined: Not applicable  Para-Aortic Lymph Nodes with Metastasis: Not applicable  Distant Metastasis:       Distant Site(s) Involved: Omentum: Not involved  Pathologic Stage Classification (pTNM, AJCC 8th Edition): pT2, pN1a  Ancillary Studies: HER2 will be ordered  Additional Findings: Leiomyomata.  Adenomyosis.  Representative Tumor Block: B1  Comment: Dr. Saralyn Pilar reviewed select slides.  (v4.2.0.1)    01/29/2021 Cancer Staging   Staging form: Corpus Uteri - Carcinoma and Carcinosarcoma, AJCC 8th Edition - Clinical stage from 01/29/2021: FIGO Stage IIIC1 (cT1b, cN1a, cM0) - Signed by Lafonda Mosses, MD on 02/02/2021 Histopathologic type: Mixed cell adenocarcinoma Stage prefix: Initial diagnosis Method of lymph node assessment: Other Histologic grade (G): G3 Histologic grading system: 3 grade system Lymph-vascular invasion (LVI): LVI present/identified, NOS Peritoneal cytology results: Negative Pelvic nodal status: Positive Number of pelvic nodes positive from dissection: 4 Number of pelvic nodes examined during dissection: 8 Para-aortic status: Not assessed Lymph node metastasis: Present Omentectomy performed: Yes Morcellation performed: No    02/19/2021 Echocardiogram    1. Left ventricular ejection fraction, by estimation, is 55 to 60%. The left ventricle has normal function. The left ventricle demonstrates regional wall motion abnormalities (see scoring diagram/findings for  description). There is mild left ventricular  hypertrophy. Left ventricular diastolic parameters are consistent with Grade I diastolic dysfunction (impaired relaxation). There is moderate hypokinesis of the left ventricular, basal septal wall and inferior wall. The average left ventricular global longitudinal strain is -17.9 %. The global longitudinal strain is abnormal.  2. Right ventricular systolic function is normal. The right ventricular size is normal. There is normal pulmonary artery systolic pressure. The estimated right ventricular systolic pressure is 97.9 mmHg.  3. The mitral valve is abnormal. Trivial mitral valve regurgitation.  4. The aortic valve is tricuspid. Aortic valve regurgitation is not visualized.  5. The inferior vena cava is normal in size with greater than 50% respiratory variability, suggesting right atrial pressure of 3 mmHg.   02/20/2021 Procedure   Successful placement of a right IJ approach Power Port with ultrasound and fluoroscopic guidance. The catheter is ready for use.   02/28/2021 - 06/14/2021 Chemotherapy    Patient is on Treatment Plan: UTERINE CARBOPLATIN AUC 6 / PACLITAXEL Q21D       04/20/2021 Imaging   Status post hysterectomy and suspected bilateral salpingo-oophorectomy.   2.2 cm fluid density lesion along the left pelvic sidewall likely reflects a postoperative seroma.   No findings suspicious for recurrent or metastatic disease.   07/16/2021 - 08/16/2021 Radiation Therapy   Radiation Treatment Dates: 07/16/2021 through 08/16/2021 Site Technique Total Dose (Gy) Dose per Fx (Gy) Completed Fx Beam Energies  Vagina: Pelvis HDR-brachy 30/30 6 5/5 Ir-192        10/01/2021 Imaging   Increasing size of LEFT pelvic lymph nodes, largest approximately 11 mm along the external iliac chain. Given findings and history could consider PET for further evaluation as warranted.   Cystic area along the LEFT pelvic sidewall that was seen previously has improved.    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   Aortic Atherosclerosis (ICD10-I70.0).   12/21/2021 Imaging   1. Enlarging soft tissue masses involving the vaginal cuff. 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 Speculum examination and re-biopsy may be confirmatory. PET-CT may be helpful. 2. New small sub 5 mm perirectal and sigmoid mesocolon nodes. 3. Slight progression of pelvic adenopathy as detailed above. 4. No  findings suspicious for abdominal metastatic disease or osseous metastatic disease. 5. Stable age advanced atherosclerotic calcifications involving the aorta and branch vessels and coronary arteries   01/01/2022 -  Chemotherapy   Patient is on Treatment Plan : UTERINE SEROUS CARCINOMA Carboplatin + Paclitaxel + Trastuzumab q21d x 6 Cycles / Trastuzumab q21d       PHYSICAL EXAMINATION: ECOG PERFORMANCE STATUS: 1 - Symptomatic but completely ambulatory  Vitals:   12/24/21 1052  BP: (!) 168/86  Pulse: 61  Resp: 18  Temp: 98.1 F (36.7 C)  SpO2: 97%   Filed Weights   12/24/21 1052  Weight: 232 lb 9.6 oz (105.5 kg)    GENERAL:alert, no distress and comfortable NEURO: alert & oriented x 3 with fluent speech, no focal motor/sensory deficits  LABORATORY DATA:  I have reviewed the data as listed    Component Value Date/Time   NA 140 12/21/2021 0948   NA 143 10/18/2020 1028   K 3.8 12/21/2021 0948   CL 105 12/21/2021 0948   CO2 25 12/21/2021 0948   GLUCOSE 158 (H) 12/21/2021 0948   BUN 20 12/21/2021 0948   BUN 13 10/18/2020 1028   CREATININE 1.02 (H) 12/21/2021 0948   CREATININE 0.79 02/11/2017 0943   CALCIUM 9.4 12/21/2021 0948   PROT 7.4 12/21/2021 0948   PROT 6.7 10/18/2020 1028   ALBUMIN 3.7 12/21/2021 0948   ALBUMIN 4.1 10/18/2020 1028   AST 11 (L) 12/21/2021 0948   ALT 10 12/21/2021 0948   ALKPHOS 106 12/21/2021 0948   BILITOT 0.4  12/21/2021 0948   GFRNONAA >60 12/21/2021 0948   GFRNONAA 83 02/11/2017 0943   GFRAA 70 10/18/2020 1028   GFRAA >89 02/11/2017 0943    No results found for: SPEP, UPEP  Lab Results  Component Value Date   WBC 5.8 12/21/2021   NEUTROABS 3.4 12/21/2021   HGB 10.6 (L) 12/21/2021   HCT 32.3 (L) 12/21/2021   MCV 89.7 12/21/2021   PLT 287 12/21/2021      Chemistry      Component Value Date/Time   NA 140 12/21/2021 0948   NA 143 10/18/2020 1028   K 3.8 12/21/2021 0948   CL 105 12/21/2021 0948   CO2 25 12/21/2021 0948   BUN 20 12/21/2021 0948   BUN 13 10/18/2020 1028   CREATININE 1.02 (H) 12/21/2021 0948   CREATININE 0.79 02/11/2017 0943      Component Value Date/Time   CALCIUM 9.4 12/21/2021 0948   ALKPHOS 106 12/21/2021 0948   AST 11 (L) 12/21/2021 0948   ALT 10 12/21/2021 0948   BILITOT 0.4 12/21/2021 0948       RADIOGRAPHIC STUDIES: I have reviewed multiple CT imaging with the patient I have personally reviewed the radiological images as listed and agreed with the findings in the report. CT ABDOMEN PELVIS W CONTRAST  Result Date: 12/22/2021 CLINICAL DATA:  History of uterine/cervical cancer status post hysterectomy. EXAM: CT ABDOMEN AND PELVIS WITH CONTRAST TECHNIQUE: Multidetector CT imaging of the abdomen and pelvis was performed using the standard protocol following bolus administration of intravenous contrast. CONTRAST:  12m OMNIPAQUE IOHEXOL 350 MG/ML SOLN COMPARISON:  09/28/2021 FINDINGS: Lower chest: The lung bases are clear of acute process. No pleural effusion or pulmonary lesions. The heart is normal in size. No pericardial effusion. Stable age advanced coronary artery calcifications. The distal esophagus and aorta are unremarkable. Hepatobiliary: No hepatic lesions or evidence of peritoneal surface disease. The gallbladder is unremarkable. No intra or extrahepatic biliary dilatation. Pancreas:  No mass, inflammation or ductal dilatation. Spleen: Normal size. No  focal lesions. Adrenals/Urinary Tract: Adrenal glands and kidneys are unremarkable. The bladder is unremarkable. Stomach/Bowel: The stomach, duodenum, small bowel and colon are unremarkable. No inflammatory changes or obstructive findings. Stable colonic diverticulosis. Vascular/Lymphatic: Stable age advanced atherosclerotic calcifications involving the aorta and branch vessels but no aneurysm or dissection. The major venous structures are patent. Stable 7 mm left para-aortic node on image 43/2. 7.5 mm node medial to the left common iliac artery on image 63/2 previously measured 6 mm. Left internal iliac node on image 66/2 measures 11 mm and previously measured 8 mm. 12 mm internal iliac node on the left on image 76/2 previously measured 9 mm. New small sub 5 mm perirectal and sigmoid mesocolon nodes. Reproductive: Status post hysterectomy and bilateral oophorectomy. Other: Enlarging soft tissue mass associated with the left vaginal fornix. This measures approximately 3.1 x 2.5 cm on image 85/2 this previously measured approximately 2.0 x 1.4 cm. There is also prominence of the right vaginal fornix with slight nodularity measuring approximately 2.4 x 2.0 cm on image 84/2. This previously measured 1.7 x 1.6 cm. 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 Speculum examination and re-biopsy may be confirmatory. PET-CT may be helpful also. Musculoskeletal: No significant bony findings. IMPRESSION: 1. Enlarging soft tissue masses involving the vaginal cuff. 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 Speculum examination and re-biopsy may be confirmatory. PET-CT may be helpful. 2. New small sub 5 mm perirectal and sigmoid mesocolon nodes. 3. Slight progression of pelvic adenopathy as detailed above. 4. No findings suspicious for abdominal metastatic disease or osseous metastatic disease. 5. Stable age advanced atherosclerotic calcifications involving the  aorta and branch vessels and coronary arteries. Aortic Atherosclerosis (ICD10-I70.0). Electronically Signed   By: Marijo Sanes M.D.   On: 12/22/2021 09:58

## 2021-12-24 NOTE — Assessment & Plan Note (Signed)
She has uncontrolled hypertension today but likely due to anxiety We will monitor her blood pressure carefully

## 2021-12-24 NOTE — Progress Notes (Signed)
DISCONTINUE ON PATHWAY REGIMEN - Uterine     A cycle is every 21 days:     Paclitaxel      Carboplatin   **Always confirm dose/schedule in your pharmacy ordering system**  REASON: Other Reason PRIOR TREATMENT: UTOS50: Carboplatin AUC=6 + Paclitaxel 175 mg/m2 q21 Days x 6 Cycles TREATMENT RESPONSE: Complete Response (CR)  START OFF PATHWAY REGIMEN - Uterine   [id number]:Carboplatin IV + Paclitaxel IV + Trastuzumab IV q21 Days (C1-6) Followed by Trastuzumab IV q21 Days (C7+):   Cycle 1: A cycle is 21 days:     Trastuzumab-xxxx      Paclitaxel      Carboplatin    Cycles 2 through 6: A cycle is every 21 days:     Trastuzumab-xxxx      Paclitaxel      Carboplatin    Cycles 7 and beyond: A cycle is every 21 days:     Trastuzumab-xxxx   **Always confirm dose/schedule in your pharmacy ordering system**  Patient Characteristics: Serous Carcinoma, Recurrent/Progressive Disease, Second Line, Relapse < 12 Months From Prior Therapy Histology: Serous Carcinoma Therapeutic Status: Recurrent or Progressive Disease Line of Therapy: Second Line Time to Recurrence: Relapse < 12 Months From Prior Therapy Intent of Therapy: Non-Curative / Palliative Intent, Discussed with Patient

## 2021-12-24 NOTE — Assessment & Plan Note (Signed)
I have reviewed multiple imaging studies with the patient and her daughter Unfortunately, she has clear signs of disease progression The pattern of progression is highly suggestive of metastatic uterine cancer recurrence I do not believe biopsy is indicated We reviewed the current guidelines and discussed treatment options My preference will be to start her on pembrolizumab/Lenvima but the patient is currently uninsured and I am not able to get her treated with this combination regimen The patient have HER2 positive disease We discussed the role of chemotherapy in a palliative fashion, for repeat treatment with combination carboplatin, paclitaxel and trastuzumab The risk, benefits, side effects were fully discussed and she is in agreement to proceed I plan to order a pretreatment echocardiogram I will order carboplatin AUC of 5 and upfront dose reduction of paclitaxel due to risks of peripheral neuropathy I plan minimum 3 cycles of treatment before repeating imaging study

## 2021-12-24 NOTE — Assessment & Plan Note (Signed)
We discussed the risk of congestive heart failure with trastuzumab Her echocardiogram from last year showed preserved ejection fraction We will repeat baseline echocardiogram before we start treatment

## 2021-12-24 NOTE — Assessment & Plan Note (Signed)
We discussed importance of risk factor modification while on treatment

## 2021-12-25 ENCOUNTER — Other Ambulatory Visit: Payer: Self-pay | Admitting: Hematology and Oncology

## 2021-12-25 ENCOUNTER — Telehealth: Payer: Self-pay

## 2021-12-25 ENCOUNTER — Ambulatory Visit (HOSPITAL_COMMUNITY)
Admission: RE | Admit: 2021-12-25 | Discharge: 2021-12-25 | Disposition: A | Discharge status: Home / Self Care | Payer: Medicaid Other | Source: Ambulatory Visit | Attending: Hematology and Oncology | Admitting: Hematology and Oncology

## 2021-12-25 ENCOUNTER — Inpatient Hospital Stay: Payer: Medicaid Other

## 2021-12-25 ENCOUNTER — Telehealth: Payer: Self-pay | Admitting: Hematology and Oncology

## 2021-12-25 DIAGNOSIS — I129 Hypertensive chronic kidney disease with stage 1 through stage 4 chronic kidney disease, or unspecified chronic kidney disease: Secondary | ICD-10-CM | POA: Insufficient documentation

## 2021-12-25 DIAGNOSIS — E1122 Type 2 diabetes mellitus with diabetic chronic kidney disease: Secondary | ICD-10-CM | POA: Diagnosis not present

## 2021-12-25 DIAGNOSIS — I2510 Atherosclerotic heart disease of native coronary artery without angina pectoris: Secondary | ICD-10-CM | POA: Diagnosis not present

## 2021-12-25 DIAGNOSIS — C541 Malignant neoplasm of endometrium: Secondary | ICD-10-CM

## 2021-12-25 DIAGNOSIS — Z5111 Encounter for antineoplastic chemotherapy: Secondary | ICD-10-CM | POA: Diagnosis not present

## 2021-12-25 DIAGNOSIS — Z01818 Encounter for other preprocedural examination: Secondary | ICD-10-CM | POA: Diagnosis present

## 2021-12-25 DIAGNOSIS — Z0189 Encounter for other specified special examinations: Secondary | ICD-10-CM | POA: Diagnosis not present

## 2021-12-25 DIAGNOSIS — E785 Hyperlipidemia, unspecified: Secondary | ICD-10-CM | POA: Diagnosis not present

## 2021-12-25 DIAGNOSIS — N189 Chronic kidney disease, unspecified: Secondary | ICD-10-CM | POA: Insufficient documentation

## 2021-12-25 LAB — CMP (CANCER CENTER ONLY)
ALT: 12 U/L (ref 0–44)
AST: 11 U/L — ABNORMAL LOW (ref 15–41)
Albumin: 3.8 g/dL (ref 3.5–5.0)
Alkaline Phosphatase: 102 U/L (ref 38–126)
Anion gap: 8 (ref 5–15)
BUN: 19 mg/dL (ref 8–23)
CO2: 27 mmol/L (ref 22–32)
Calcium: 9.4 mg/dL (ref 8.9–10.3)
Chloride: 107 mmol/L (ref 98–111)
Creatinine: 1.09 mg/dL — ABNORMAL HIGH (ref 0.44–1.00)
GFR, Estimated: 57 mL/min — ABNORMAL LOW (ref >60)
Glucose, Bld: 145 mg/dL — ABNORMAL HIGH (ref 70–99)
Potassium: 3.9 mmol/L (ref 3.5–5.1)
Sodium: 142 mmol/L (ref 135–145)
Total Bilirubin: 0.4 mg/dL (ref 0.3–1.2)
Total Protein: 7.3 g/dL (ref 6.5–8.1)

## 2021-12-25 LAB — CBC WITH DIFFERENTIAL (CANCER CENTER ONLY)
Abs Immature Granulocytes: 0.02 10*3/uL (ref 0.00–0.07)
Basophils Absolute: 0 10*3/uL (ref 0.0–0.1)
Basophils Relative: 1 %
Eosinophils Absolute: 0.1 10*3/uL (ref 0.0–0.5)
Eosinophils Relative: 1 %
HCT: 33.4 % — ABNORMAL LOW (ref 36.0–46.0)
Hemoglobin: 10.7 g/dL — ABNORMAL LOW (ref 12.0–15.0)
Immature Granulocytes: 0 %
Lymphocytes Relative: 28 %
Lymphs Abs: 1.7 10*3/uL (ref 0.7–4.0)
MCH: 29 pg (ref 26.0–34.0)
MCHC: 32 g/dL (ref 30.0–36.0)
MCV: 90.5 fL (ref 80.0–100.0)
Monocytes Absolute: 0.6 10*3/uL (ref 0.1–1.0)
Monocytes Relative: 10 %
Neutro Abs: 3.7 10*3/uL (ref 1.7–7.7)
Neutrophils Relative %: 60 %
Platelet Count: 293 10*3/uL (ref 150–400)
RBC: 3.69 MIL/uL — ABNORMAL LOW (ref 3.87–5.11)
RDW: 13.9 % (ref 11.5–15.5)
WBC Count: 6.1 10*3/uL (ref 4.0–10.5)
nRBC: 0 % (ref 0.0–0.2)

## 2021-12-25 LAB — ECHOCARDIOGRAM COMPLETE
Area-P 1/2: 2.17 cm2
S' Lateral: 3.1 cm

## 2021-12-25 NOTE — Telephone Encounter (Signed)
Called and given appt with Dr. Sallyanne Kuster on 1/18 at 3:30 pm. Daughter verbalized understanding.

## 2021-12-25 NOTE — Telephone Encounter (Signed)
Called and given below message to daughter. She verbalized understanding. Called cardiology and they will call the office back with appt for [name].

## 2021-12-25 NOTE — Telephone Encounter (Signed)
Scheduled appointment per 01/09 los. Patient is aware.

## 2021-12-25 NOTE — Telephone Encounter (Signed)
-----   Message from Heath Lark, MD sent at 12/25/2021  1:12 PM EST ----- Call daughter, let her echo is ok but I would like her to schedule follow-up with cardiologist while on herceptin. Can you call her cardiologist office for appt?

## 2021-12-25 NOTE — Progress Notes (Signed)
°  Echocardiogram 2D Echocardiogram has been performed.  [name] 12/25/2021, 9:48 AM

## 2021-12-25 NOTE — Telephone Encounter (Signed)
Received a call from [facility] with [place].Stated Dr.Gorsuch wanted patient to see Dr.Croitoru to follow up echo she had done today.Appointment scheduled with Dr.Croitoru 1/18 at 3:30 pm.

## 2021-12-26 NOTE — Progress Notes (Signed)
..  Patient is receiving Replacement Medication. Medication: Ogivir (trastuzumab-dkst) Manufacture: [employer] Patient Assistance Program Approval Dates: Approved from 12/26/2021 until 12/26/2022. ID: [id number] Reason: Self Pay First DOS: 01/01/2022.  Marland KitchenJuan Quam, CPhT IV Drug Replacement Specialist [place] Phone: [phone]

## 2021-12-31 MED FILL — Fosaprepitant Dimeglumine For IV Infusion 150 MG (Base Eq): INTRAVENOUS | Qty: 5 | Status: AC

## 2021-12-31 MED FILL — Dexamethasone Sodium Phosphate Inj 100 MG/10ML: INTRAMUSCULAR | Qty: 1 | Status: AC

## 2022-01-01 ENCOUNTER — Other Ambulatory Visit: Payer: Self-pay

## 2022-01-01 ENCOUNTER — Inpatient Hospital Stay: Payer: Medicaid Other

## 2022-01-01 VITALS — BP 146/78 | HR 95 | Temp 98.7°F | Resp 18 | Wt 235.0 lb

## 2022-01-01 DIAGNOSIS — Z5111 Encounter for antineoplastic chemotherapy: Secondary | ICD-10-CM | POA: Diagnosis not present

## 2022-01-01 DIAGNOSIS — C541 Malignant neoplasm of endometrium: Secondary | ICD-10-CM

## 2022-01-01 MED ORDER — SODIUM CHLORIDE 0.9 % IV SOLN
565.0000 mg | Freq: Once | INTRAVENOUS | Status: AC
  Administered 2022-01-01: 570 mg via INTRAVENOUS
  Filled 2022-01-01: qty 57

## 2022-01-01 MED ORDER — SODIUM CHLORIDE 0.9 % IV SOLN
150.0000 mg | Freq: Once | INTRAVENOUS | Status: AC
  Administered 2022-01-01: 150 mg via INTRAVENOUS
  Filled 2022-01-01: qty 150

## 2022-01-01 MED ORDER — SODIUM CHLORIDE 0.9 % IV SOLN: Freq: Once | INTRAVENOUS | Status: AC

## 2022-01-01 MED ORDER — ACETAMINOPHEN 325 MG PO TABS
650.0000 mg | ORAL_TABLET | Freq: Once | ORAL | Status: AC
  Administered 2022-01-01: 650 mg via ORAL
  Filled 2022-01-01: qty 2

## 2022-01-01 MED ORDER — SODIUM CHLORIDE 0.9 % IV SOLN
131.2500 mg/m2 | Freq: Once | INTRAVENOUS | Status: AC
  Administered 2022-01-01: 282 mg via INTRAVENOUS
  Filled 2022-01-01: qty 47

## 2022-01-01 MED ORDER — SODIUM CHLORIDE 0.9 % IV SOLN
10.0000 mg | Freq: Once | INTRAVENOUS | Status: AC
  Administered 2022-01-01: 10 mg via INTRAVENOUS
  Filled 2022-01-01: qty 10

## 2022-01-01 MED ORDER — TRASTUZUMAB-DKST CHEMO 150 MG IV SOLR
6.0000 mg/kg | Freq: Once | INTRAVENOUS | Status: AC
  Administered 2022-01-01: 630 mg via INTRAVENOUS
  Filled 2022-01-01: qty 30

## 2022-01-01 MED ORDER — SODIUM CHLORIDE 0.9% FLUSH
10.0000 mL | INTRAVENOUS | Status: DC | PRN
  Administered 2022-01-01: 10 mL

## 2022-01-01 MED ORDER — PALONOSETRON HCL INJECTION 0.25 MG/5ML
0.2500 mg | Freq: Once | INTRAVENOUS | Status: AC
  Administered 2022-01-01: 0.25 mg via INTRAVENOUS
  Filled 2022-01-01: qty 5

## 2022-01-01 MED ORDER — FAMOTIDINE 20 MG IN NS 100 ML IVPB
20.0000 mg | Freq: Once | INTRAVENOUS | Status: AC
  Administered 2022-01-01: 20 mg via INTRAVENOUS
  Filled 2022-01-01: qty 100

## 2022-01-01 MED ORDER — HEPARIN SOD (PORK) LOCK FLUSH 100 UNIT/ML IV SOLN
500.0000 [IU] | Freq: Once | INTRAVENOUS | Status: AC | PRN
  Administered 2022-01-01: 500 [IU]

## 2022-01-01 MED ORDER — DIPHENHYDRAMINE HCL 50 MG/ML IJ SOLN
25.0000 mg | Freq: Once | INTRAMUSCULAR | Status: AC
  Administered 2022-01-01: 25 mg via INTRAVENOUS
  Filled 2022-01-01: qty 1

## 2022-01-01 NOTE — Patient Instructions (Addendum)
[facility]  Discharge Instructions: Thank you for choosing [facility] to provide your oncology and hematology care.   If you have a lab appointment with the [facility], please go directly to the [facility] and check in at the registration area.   Wear comfortable clothing and clothing appropriate for easy access to any Portacath or PICC line.   We strive to give you quality time with your provider. You may need to reschedule your appointment if you arrive late (15 or more minutes).  Arriving late affects you and other patients whose appointments are after yours.  Also, if you miss three or more appointments without notifying the office, you may be dismissed from the clinic at the providers discretion.      For prescription refill requests, have your pharmacy contact our office and allow 72 hours for refills to be completed.    Today you received the following chemotherapy and/or immunotherapy agents: Ogivri, Taxol, & Carboplatin   To help prevent nausea and vomiting after your treatment, we encourage you to take your nausea medication as directed.  BELOW ARE SYMPTOMS THAT SHOULD BE REPORTED IMMEDIATELY: *FEVER GREATER THAN 100.4 F (38 C) OR HIGHER *CHILLS OR SWEATING *NAUSEA AND VOMITING THAT IS NOT CONTROLLED WITH YOUR NAUSEA MEDICATION *UNUSUAL SHORTNESS OF BREATH *UNUSUAL BRUISING OR BLEEDING *URINARY PROBLEMS (pain or burning when urinating, or frequent urination) *BOWEL PROBLEMS (unusual diarrhea, constipation, pain near the anus) TENDERNESS IN MOUTH AND THROAT WITH OR WITHOUT PRESENCE OF ULCERS (sore throat, sores in mouth, or a toothache) UNUSUAL RASH, SWELLING OR PAIN  UNUSUAL VAGINAL DISCHARGE OR ITCHING   Items with * indicate a potential emergency and should be followed up as soon as possible or go to the Emergency Department if any problems should occur.  Please show the CHEMOTHERAPY ALERT CARD or IMMUNOTHERAPY ALERT CARD  at check-in to the Emergency Department and triage nurse.  Should you have questions after your visit or need to cancel or reschedule your appointment, please contact [facility]  Dept: [phone]  and follow the prompts.  Office hours are 8:00 a.m. to 4:30 p.m. Monday - Friday. Please note that voicemails left after 4:00 p.m. may not be returned until the following business day.  We are closed weekends and major holidays. You have access to a nurse at all times for urgent questions. Please call the main number to the clinic Dept: [phone] and follow the prompts.   For any non-urgent questions, you may also contact your provider using MyChart. We now offer e-Visits for anyone 34 and older to request care online for non-urgent symptoms. For details visit [email or web address].   Also download the MyChart app! Go to the app store, search "MyChart", open the app, select [facility], and log in with your MyChart username and password.  Due to Covid, a mask is required upon entering the hospital/clinic. If you do not have a mask, one will be given to you upon arrival. For doctor visits, patients may have 1 support person aged 26 or older with them. For treatment visits, patients cannot have anyone with them due to current Covid guidelines and our immunocompromised population.   Trastuzumab injection for infusion What is this medication? TRASTUZUMAB (tras TOO zoo mab) is a monoclonal antibody. It is used to treat breast cancer and stomach cancer. This medicine may be used for other purposes; ask your health care provider or pharmacist if you have questions. COMMON BRAND NAME(S):  Herceptin, Donnald Garre What should I tell my care team before I take this medication? They need to know if you have any of these conditions: heart disease heart failure lung or breathing disease, like asthma an unusual or allergic reaction to  trastuzumab, benzyl alcohol, or other medications, foods, dyes, or preservatives pregnant or trying to get pregnant breast-feeding How should I use this medication? This drug is given as an infusion into a vein. It is administered in a hospital or clinic by a specially trained health care professional. Talk to your pediatrician regarding the use of this medicine in children. This medicine is not approved for use in children. Overdosage: If you think you have taken too much of this medicine contact a poison control center or emergency room at once. NOTE: This medicine is only for you. Do not share this medicine with others. What if I miss a dose? It is important not to miss a dose. Call your doctor or health care professional if you are unable to keep an appointment. What may interact with this medication? This medicine may interact with the following medications: certain types of chemotherapy, such as daunorubicin, doxorubicin, epirubicin, and idarubicin This list may not describe all possible interactions. Give your health care provider a list of all the medicines, herbs, non-prescription drugs, or dietary supplements you use. Also tell them if you smoke, drink alcohol, or use illegal drugs. Some items may interact with your medicine. What should I watch for while using this medication? Visit your doctor for checks on your progress. Report any side effects. Continue your course of treatment even though you feel ill unless your doctor tells you to stop. Call your doctor or health care professional for advice if you get a fever, chills or sore throat, or other symptoms of a cold or flu. Do not treat yourself. Try to avoid being around people who are sick. You may experience fever, chills and shaking during your first infusion. These effects are usually mild and can be treated with other medicines. Report any side effects during the infusion to your health care professional. Fever and chills usually  do not happen with later infusions. Do not become pregnant while taking this medicine or for 7 months after stopping it. Women should inform their doctor if they wish to become pregnant or think they might be pregnant. Women of child-bearing potential will need to have a negative pregnancy test before starting this medicine. There is a potential for serious side effects to an unborn child. Talk to your health care professional or pharmacist for more information. Do not breast-feed an infant while taking this medicine or for 7 months after stopping it. Women must use effective birth control with this medicine. What side effects may I notice from receiving this medication? Side effects that you should report to your doctor or health care professional as soon as possible: allergic reactions like skin rash, itching or hives, swelling of the face, lips, or tongue chest pain or palpitations cough dizziness feeling faint or lightheaded, falls fever general ill feeling or flu-like symptoms signs of worsening heart failure like breathing problems; swelling in your legs and feet unusually weak or tired Side effects that usually do not require medical attention (report to your doctor or health care professional if they continue or are bothersome): bone pain changes in taste diarrhea joint pain nausea/vomiting weight loss This list may not describe all possible side effects. Call your doctor for medical advice about side effects.  You may report side effects to FDA at 1-800-FDA-1088. Where should I keep my medication? This drug is given in a hospital or clinic and will not be stored at home. NOTE: This sheet is a summary. It may not cover all possible information. If you have questions about this medicine, talk to your doctor, pharmacist, or health care provider.  2022 Elsevier/Gold Standard (2016-12-17 00:00:00)  Paclitaxel injection What is this medication? PACLITAXEL (PAK li TAX el) is a  chemotherapy drug. It targets fast dividing cells, like cancer cells, and causes these cells to die. This medicine is used to treat ovarian cancer, breast cancer, lung cancer, Kaposi's sarcoma, and other cancers. This medicine may be used for other purposes; ask your health care provider or pharmacist if you have questions. COMMON BRAND NAME(S): Onxol, Taxol What should I tell my care team before I take this medication? They need to know if you have any of these conditions: history of irregular heartbeat liver disease low blood counts, like low white cell, platelet, or red cell counts lung or breathing disease, like asthma tingling of the fingers or toes, or other nerve disorder an unusual or allergic reaction to paclitaxel, alcohol, polyoxyethylated castor oil, other chemotherapy, other medicines, foods, dyes, or preservatives pregnant or trying to get pregnant breast-feeding How should I use this medication? This drug is given as an infusion into a vein. It is administered in a hospital or clinic by a specially trained health care professional. Talk to your pediatrician regarding the use of this medicine in children. Special care may be needed. Overdosage: If you think you have taken too much of this medicine contact a poison control center or emergency room at once. NOTE: This medicine is only for you. Do not share this medicine with others. What if I miss a dose? It is important not to miss your dose. Call your doctor or health care professional if you are unable to keep an appointment. What may interact with this medication? Do not take this medicine with any of the following medications: live virus vaccines This medicine may also interact with the following medications: antiviral medicines for hepatitis, HIV or AIDS certain antibiotics like erythromycin and clarithromycin certain medicines for fungal infections like ketoconazole and itraconazole certain medicines for seizures like  carbamazepine, phenobarbital, phenytoin gemfibrozil nefazodone rifampin St. John's wort This list may not describe all possible interactions. Give your health care provider a list of all the medicines, herbs, non-prescription drugs, or dietary supplements you use. Also tell them if you smoke, drink alcohol, or use illegal drugs. Some items may interact with your medicine. What should I watch for while using this medication? Your condition will be monitored carefully while you are receiving this medicine. You will need important blood work done while you are taking this medicine. This medicine can cause serious allergic reactions. To reduce your risk you will need to take other medicine(s) before treatment with this medicine. If you experience allergic reactions like skin rash, itching or hives, swelling of the face, lips, or tongue, tell your doctor or health care professional right away. In some cases, you may be given additional medicines to help with side effects. Follow all directions for their use. This drug may make you feel generally unwell. This is not uncommon, as chemotherapy can affect healthy cells as well as cancer cells. Report any side effects. Continue your course of treatment even though you feel ill unless your doctor tells you to stop. Call your doctor or health care  professional for advice if you get a fever, chills or sore throat, or other symptoms of a cold or flu. Do not treat yourself. This drug decreases your body's ability to fight infections. Try to avoid being around people who are sick. This medicine may increase your risk to bruise or bleed. Call your doctor or health care professional if you notice any unusual bleeding. Be careful brushing and flossing your teeth or using a toothpick because you may get an infection or bleed more easily. If you have any dental work done, tell your dentist you are receiving this medicine. Avoid taking products that contain aspirin,  acetaminophen, ibuprofen, naproxen, or ketoprofen unless instructed by your doctor. These medicines may hide a fever. Do not become pregnant while taking this medicine. Women should inform their doctor if they wish to become pregnant or think they might be pregnant. There is a potential for serious side effects to an unborn child. Talk to your health care professional or pharmacist for more information. Do not breast-feed an infant while taking this medicine. Men are advised not to father a child while receiving this medicine. This product may contain alcohol. Ask your pharmacist or healthcare provider if this medicine contains alcohol. Be sure to tell all healthcare providers you are taking this medicine. Certain medicines, like metronidazole and disulfiram, can cause an unpleasant reaction when taken with alcohol. The reaction includes flushing, headache, nausea, vomiting, sweating, and increased thirst. The reaction can last from 30 minutes to several hours. What side effects may I notice from receiving this medication? Side effects that you should report to your doctor or health care professional as soon as possible: allergic reactions like skin rash, itching or hives, swelling of the face, lips, or tongue breathing problems changes in vision fast, irregular heartbeat high or low blood pressure mouth sores pain, tingling, numbness in the hands or feet signs of decreased platelets or bleeding - bruising, pinpoint red spots on the skin, black, tarry stools, blood in the urine signs of decreased red blood cells - unusually weak or tired, feeling faint or lightheaded, falls signs of infection - fever or chills, cough, sore throat, pain or difficulty passing urine signs and symptoms of liver injury like dark yellow or brown urine; general ill feeling or flu-like symptoms; light-colored stools; loss of appetite; nausea; right upper belly pain; unusually weak or tired; yellowing of the eyes or  skin swelling of the ankles, feet, hands unusually slow heartbeat Side effects that usually do not require medical attention (report to your doctor or health care professional if they continue or are bothersome): diarrhea hair loss loss of appetite muscle or joint pain nausea, vomiting pain, redness, or irritation at site where injected tiredness This list may not describe all possible side effects. Call your doctor for medical advice about side effects. You may report side effects to FDA at 1-800-FDA-1088. Where should I keep my medication? This drug is given in a hospital or clinic and will not be stored at home. NOTE: This sheet is a summary. It may not cover all possible information. If you have questions about this medicine, talk to your doctor, pharmacist, or health care provider.  2022 Elsevier/Gold Standard (2021-08-21 00:00:00)  Carboplatin injection What is this medication? CARBOPLATIN (KAR boe pla tin) is a chemotherapy drug. It targets fast dividing cells, like cancer cells, and causes these cells to die. This medicine is used to treat ovarian cancer and many other cancers. This medicine may be used for other purposes;  ask your health care provider or pharmacist if you have questions. COMMON BRAND NAME(S): Paraplatin What should I tell my care team before I take this medication? They need to know if you have any of these conditions: blood disorders hearing problems kidney disease recent or ongoing radiation therapy an unusual or allergic reaction to carboplatin, cisplatin, other chemotherapy, other medicines, foods, dyes, or preservatives pregnant or trying to get pregnant breast-feeding How should I use this medication? This drug is usually given as an infusion into a vein. It is administered in a hospital or clinic by a specially trained health care professional. Talk to your pediatrician regarding the use of this medicine in children. Special care may be  needed. Overdosage: If you think you have taken too much of this medicine contact a poison control center or emergency room at once. NOTE: This medicine is only for you. Do not share this medicine with others. What if I miss a dose? It is important not to miss a dose. Call your doctor or health care professional if you are unable to keep an appointment. What may interact with this medication? medicines for seizures medicines to increase blood counts like filgrastim, pegfilgrastim, sargramostim some antibiotics like amikacin, gentamicin, neomycin, streptomycin, tobramycin vaccines Talk to your doctor or health care professional before taking any of these medicines: acetaminophen aspirin ibuprofen ketoprofen naproxen This list may not describe all possible interactions. Give your health care provider a list of all the medicines, herbs, non-prescription drugs, or dietary supplements you use. Also tell them if you smoke, drink alcohol, or use illegal drugs. Some items may interact with your medicine. What should I watch for while using this medication? Your condition will be monitored carefully while you are receiving this medicine. You will need important blood work done while you are taking this medicine. This drug may make you feel generally unwell. This is not uncommon, as chemotherapy can affect healthy cells as well as cancer cells. Report any side effects. Continue your course of treatment even though you feel ill unless your doctor tells you to stop. In some cases, you may be given additional medicines to help with side effects. Follow all directions for their use. Call your doctor or health care professional for advice if you get a fever, chills or sore throat, or other symptoms of a cold or flu. Do not treat yourself. This drug decreases your body's ability to fight infections. Try to avoid being around people who are sick. This medicine may increase your risk to bruise or bleed. Call  your doctor or health care professional if you notice any unusual bleeding. Be careful brushing and flossing your teeth or using a toothpick because you may get an infection or bleed more easily. If you have any dental work done, tell your dentist you are receiving this medicine. Avoid taking products that contain aspirin, acetaminophen, ibuprofen, naproxen, or ketoprofen unless instructed by your doctor. These medicines may hide a fever. Do not become pregnant while taking this medicine. Women should inform their doctor if they wish to become pregnant or think they might be pregnant. There is a potential for serious side effects to an unborn child. Talk to your health care professional or pharmacist for more information. Do not breast-feed an infant while taking this medicine. What side effects may I notice from receiving this medication? Side effects that you should report to your doctor or health care professional as soon as possible: allergic reactions like skin rash, itching or hives, swelling  of the face, lips, or tongue signs of infection - fever or chills, cough, sore throat, pain or difficulty passing urine signs of decreased platelets or bleeding - bruising, pinpoint red spots on the skin, black, tarry stools, nosebleeds signs of decreased red blood cells - unusually weak or tired, fainting spells, lightheadedness breathing problems changes in hearing changes in vision chest pain high blood pressure low blood counts - This drug may decrease the number of white blood cells, red blood cells and platelets. You may be at increased risk for infections and bleeding. nausea and vomiting pain, swelling, redness or irritation at the injection site pain, tingling, numbness in the hands or feet problems with balance, talking, walking trouble passing urine or change in the amount of urine Side effects that usually do not require medical attention (report to your doctor or health care professional  if they continue or are bothersome): hair loss loss of appetite metallic taste in the mouth or changes in taste This list may not describe all possible side effects. Call your doctor for medical advice about side effects. You may report side effects to FDA at 1-800-FDA-1088. Where should I keep my medication? This drug is given in a hospital or clinic and will not be stored at home. NOTE: This sheet is a summary. It may not cover all possible information. If you have questions about this medicine, talk to your doctor, pharmacist, or health care provider.  2022 Elsevier/Gold Standard (2008-05-11 00:00:00)
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
## 2022-01-02 ENCOUNTER — Ambulatory Visit: Payer: Self-pay | Admitting: Cardiovascular Disease

## 2022-01-04 ENCOUNTER — Telehealth: Payer: Self-pay
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
## 2022-01-07 ENCOUNTER — Inpatient Hospital Stay (HOSPITAL_COMMUNITY)
Admission: EM | Admit: 2022-01-07 | Discharge: 2022-01-10 | DRG: 391 | Disposition: A | Discharge status: Home / Self Care | Payer: Medicaid Other | Attending: Internal Medicine | Admitting: Internal Medicine

## 2022-01-07 ENCOUNTER — Emergency Department (HOSPITAL_COMMUNITY): Payer: Medicaid Other

## 2022-01-07 ENCOUNTER — Other Ambulatory Visit: Payer: Self-pay

## 2022-01-07 ENCOUNTER — Encounter (HOSPITAL_COMMUNITY): Payer: Self-pay

## 2022-01-07 DIAGNOSIS — N3000 Acute cystitis without hematuria: Secondary | ICD-10-CM | POA: Diagnosis present

## 2022-01-07 DIAGNOSIS — E1122 Type 2 diabetes mellitus with diabetic chronic kidney disease: Secondary | ICD-10-CM | POA: Diagnosis present

## 2022-01-07 DIAGNOSIS — N179 Acute kidney failure, unspecified: Secondary | ICD-10-CM | POA: Diagnosis present

## 2022-01-07 DIAGNOSIS — C541 Malignant neoplasm of endometrium: Secondary | ICD-10-CM | POA: Diagnosis present

## 2022-01-07 DIAGNOSIS — K219 Gastro-esophageal reflux disease without esophagitis: Secondary | ICD-10-CM | POA: Diagnosis present

## 2022-01-07 DIAGNOSIS — K859 Acute pancreatitis without necrosis or infection, unspecified: Secondary | ICD-10-CM | POA: Diagnosis present

## 2022-01-07 DIAGNOSIS — Z923 Personal history of irradiation: Secondary | ICD-10-CM

## 2022-01-07 DIAGNOSIS — Z9221 Personal history of antineoplastic chemotherapy: Secondary | ICD-10-CM

## 2022-01-07 DIAGNOSIS — R112 Nausea with vomiting, unspecified: Secondary | ICD-10-CM | POA: Diagnosis present

## 2022-01-07 DIAGNOSIS — Z800 Family history of malignant neoplasm of digestive organs: Secondary | ICD-10-CM

## 2022-01-07 DIAGNOSIS — Z7984 Long term (current) use of oral hypoglycemic drugs: Secondary | ICD-10-CM

## 2022-01-07 DIAGNOSIS — Z20822 Contact with and (suspected) exposure to covid-19: Secondary | ICD-10-CM | POA: Diagnosis present

## 2022-01-07 DIAGNOSIS — I5032 Chronic diastolic (congestive) heart failure: Secondary | ICD-10-CM | POA: Diagnosis present

## 2022-01-07 DIAGNOSIS — E785 Hyperlipidemia, unspecified: Secondary | ICD-10-CM | POA: Diagnosis present

## 2022-01-07 DIAGNOSIS — I10 Essential (primary) hypertension: Secondary | ICD-10-CM | POA: Diagnosis present

## 2022-01-07 DIAGNOSIS — K2980 Duodenitis without bleeding: Principal | ICD-10-CM | POA: Diagnosis present

## 2022-01-07 DIAGNOSIS — N1831 Chronic kidney disease, stage 3a: Secondary | ICD-10-CM | POA: Diagnosis present

## 2022-01-07 DIAGNOSIS — Z87891 Personal history of nicotine dependence: Secondary | ICD-10-CM

## 2022-01-07 DIAGNOSIS — Z955 Presence of coronary angioplasty implant and graft: Secondary | ICD-10-CM

## 2022-01-07 DIAGNOSIS — T451X5A Adverse effect of antineoplastic and immunosuppressive drugs, initial encounter: Secondary | ICD-10-CM | POA: Diagnosis present

## 2022-01-07 DIAGNOSIS — Z79899 Other long term (current) drug therapy: Secondary | ICD-10-CM

## 2022-01-07 DIAGNOSIS — Z7982 Long term (current) use of aspirin: Secondary | ICD-10-CM

## 2022-01-07 DIAGNOSIS — E119 Type 2 diabetes mellitus without complications: Secondary | ICD-10-CM

## 2022-01-07 DIAGNOSIS — R109 Unspecified abdominal pain: Secondary | ICD-10-CM | POA: Diagnosis present

## 2022-01-07 DIAGNOSIS — Z833 Family history of diabetes mellitus: Secondary | ICD-10-CM

## 2022-01-07 DIAGNOSIS — Z90710 Acquired absence of both cervix and uterus: Secondary | ICD-10-CM

## 2022-01-07 DIAGNOSIS — Z6841 Body Mass Index (BMI) 40.0 and over, adult: Secondary | ICD-10-CM

## 2022-01-07 DIAGNOSIS — I2510 Atherosclerotic heart disease of native coronary artery without angina pectoris: Secondary | ICD-10-CM | POA: Diagnosis present

## 2022-01-07 DIAGNOSIS — Z8249 Family history of ischemic heart disease and other diseases of the circulatory system: Secondary | ICD-10-CM

## 2022-01-07 DIAGNOSIS — I130 Hypertensive heart and chronic kidney disease with heart failure and stage 1 through stage 4 chronic kidney disease, or unspecified chronic kidney disease: Secondary | ICD-10-CM | POA: Diagnosis present

## 2022-01-07 DIAGNOSIS — E860 Dehydration: Secondary | ICD-10-CM | POA: Diagnosis present

## 2022-01-07 DIAGNOSIS — I252 Old myocardial infarction: Secondary | ICD-10-CM

## 2022-01-07 LAB — COMPREHENSIVE METABOLIC PANEL
ALT: 27 U/L (ref 0–44)
AST: 21 U/L (ref 15–41)
Albumin: 3.5 g/dL (ref 3.5–5.0)
Alkaline Phosphatase: 94 U/L (ref 38–126)
Anion gap: 10 (ref 5–15)
BUN: 19 mg/dL (ref 8–23)
CO2: 24 mmol/L (ref 22–32)
Calcium: 9 mg/dL (ref 8.9–10.3)
Chloride: 100 mmol/L (ref 98–111)
Creatinine, Ser: 1.27 mg/dL — ABNORMAL HIGH (ref 0.44–1.00)
GFR, Estimated: 48 mL/min — ABNORMAL LOW (ref >60)
Glucose, Bld: 250 mg/dL — ABNORMAL HIGH (ref 70–99)
Potassium: 4.2 mmol/L (ref 3.5–5.1)
Sodium: 134 mmol/L — ABNORMAL LOW (ref 135–145)
Total Bilirubin: 0.7 mg/dL (ref 0.3–1.2)
Total Protein: 7.5 g/dL (ref 6.5–8.1)

## 2022-01-07 LAB — CBC
HCT: 33.4 % — ABNORMAL LOW (ref 36.0–46.0)
Hemoglobin: 11.1 g/dL — ABNORMAL LOW (ref 12.0–15.0)
MCH: 29.3 pg (ref 26.0–34.0)
MCHC: 33.2 g/dL (ref 30.0–36.0)
MCV: 88.1 fL (ref 80.0–100.0)
Platelets: 204 10*3/uL (ref 150–400)
RBC: 3.79 MIL/uL — ABNORMAL LOW (ref 3.87–5.11)
RDW: 13.6 % (ref 11.5–15.5)
WBC: 4.2 10*3/uL (ref 4.0–10.5)
nRBC: 0 % (ref 0.0–0.2)

## 2022-01-07 LAB — CK: Total CK: 56 U/L (ref 38–234)

## 2022-01-07 LAB — URINALYSIS, ROUTINE W REFLEX MICROSCOPIC
Bacteria, UA: NONE SEEN
Bilirubin Urine: NEGATIVE
Glucose, UA: 50 mg/dL — AB
Ketones, ur: 20 mg/dL — AB
Nitrite: NEGATIVE
Protein, ur: >=300 mg/dL — AB
Specific Gravity, Urine: 1.02 (ref 1.005–1.030)
WBC, UA: >50 WBC/hpf — ABNORMAL HIGH (ref 0–5)
pH: 5 (ref 5.0–8.0)

## 2022-01-07 LAB — LIPASE, BLOOD: Lipase: 30 U/L (ref 11–51)

## 2022-01-07 LAB — PROTIME-INR
INR: 1 (ref 0.8–1.2)
Prothrombin Time: 13.1 seconds (ref 11.4–15.2)

## 2022-01-07 LAB — APTT: aPTT: 26 seconds (ref 24–36)

## 2022-01-07 MED ORDER — FENTANYL CITRATE PF 50 MCG/ML IJ SOSY
100.0000 ug | PREFILLED_SYRINGE | Freq: Once | INTRAMUSCULAR | Status: AC
  Administered 2022-01-07: 100 ug via INTRAVENOUS
  Filled 2022-01-07: qty 2

## 2022-01-07 MED ORDER — SODIUM CHLORIDE 0.9 % IV SOLN
1.0000 g | Freq: Once | INTRAVENOUS | Status: AC
  Administered 2022-01-08: 01:00:00 1 g via INTRAVENOUS
  Filled 2022-01-07: qty 10

## 2022-01-07 MED ORDER — ONDANSETRON HCL 4 MG/2ML IJ SOLN
4.0000 mg | Freq: Once | INTRAMUSCULAR | Status: AC
Start: 2022-01-07 — End: 2022-01-07
  Administered 2022-01-07: 4 mg via INTRAVENOUS
  Filled 2022-01-07: qty 2

## 2022-01-07 MED ORDER — IOHEXOL 300 MG/ML  SOLN
100.0000 mL | Freq: Once | INTRAMUSCULAR | Status: AC | PRN
  Administered 2022-01-07: 100 mL via INTRAVENOUS

## 2022-01-07 MED ORDER — LACTATED RINGERS IV BOLUS (SEPSIS)
1000.0000 mL | Freq: Once | INTRAVENOUS | Status: AC
  Administered 2022-01-08: 1000 mL via INTRAVENOUS

## 2022-01-07 NOTE — ED Notes (Signed)
[place] (daughter)

## 2022-01-07 NOTE — ED Triage Notes (Signed)
Reports vomiting, weakness, and generalized abdominal pain x 3 days. Recent Chemo treatment tuesday for uterine cancer. Directed here for evaluation.

## 2022-01-08 DIAGNOSIS — R109 Unspecified abdominal pain: Secondary | ICD-10-CM | POA: Diagnosis present

## 2022-01-08 DIAGNOSIS — C541 Malignant neoplasm of endometrium: Secondary | ICD-10-CM | POA: Diagnosis not present

## 2022-01-08 DIAGNOSIS — R1013 Epigastric pain: Secondary | ICD-10-CM | POA: Diagnosis not present

## 2022-01-08 LAB — RESP PANEL BY RT-PCR (FLU A&B, COVID) ARPGX2
Influenza A by PCR: NEGATIVE
Influenza B by PCR: NEGATIVE
SARS Coronavirus 2 by RT PCR: NEGATIVE

## 2022-01-08 LAB — CBC
HCT: 30.7 % — ABNORMAL LOW (ref 36.0–46.0)
Hemoglobin: 10.2 g/dL — ABNORMAL LOW (ref 12.0–15.0)
MCH: 29.6 pg (ref 26.0–34.0)
MCHC: 33.2 g/dL (ref 30.0–36.0)
MCV: 89 fL (ref 80.0–100.0)
Platelets: 179 10*3/uL (ref 150–400)
RBC: 3.45 MIL/uL — ABNORMAL LOW (ref 3.87–5.11)
RDW: 13.6 % (ref 11.5–15.5)
WBC: 2.3 10*3/uL — ABNORMAL LOW (ref 4.0–10.5)
nRBC: 0 % (ref 0.0–0.2)

## 2022-01-08 LAB — CBG MONITORING, ED
Glucose-Capillary: 186 mg/dL — ABNORMAL HIGH (ref 70–99)
Glucose-Capillary: 171 mg/dL — ABNORMAL HIGH (ref 70–99)
Glucose-Capillary: 125 mg/dL — ABNORMAL HIGH (ref 70–99)

## 2022-01-08 LAB — HEMOGLOBIN A1C
Hgb A1c MFr Bld: 7.9 % — ABNORMAL HIGH (ref 4.8–5.6)
Mean Plasma Glucose: 180 mg/dL

## 2022-01-08 LAB — CREATININE, SERUM
Creatinine, Ser: 1.05 mg/dL — ABNORMAL HIGH (ref 0.44–1.00)
GFR, Estimated: 60 mL/min — ABNORMAL LOW (ref >60)

## 2022-01-08 LAB — LACTIC ACID, PLASMA: Lactic Acid, Venous: 0.9 mmol/L (ref 0.5–1.9)

## 2022-01-08 MED ORDER — ASPIRIN EC 81 MG PO TBEC
81.0000 mg | DELAYED_RELEASE_TABLET | Freq: Every day | ORAL | Status: DC
  Administered 2022-01-09 – 2022-01-10 (×2): 81 mg via ORAL
  Filled 2022-01-08 (×2): qty 1

## 2022-01-08 MED ORDER — ASPIRIN 81 MG PO TBEC: 81.0000 mg | DELAYED_RELEASE_TABLET | Freq: Every day | ORAL | Status: DC

## 2022-01-08 MED ORDER — ATORVASTATIN CALCIUM 40 MG PO TABS
80.0000 mg | ORAL_TABLET | Freq: Every day | ORAL | Status: DC
  Administered 2022-01-08 – 2022-01-09 (×2): 80 mg via ORAL
  Filled 2022-01-08 (×2): qty 2

## 2022-01-08 MED ORDER — LIDOCAINE-PRILOCAINE 2.5-2.5 % EX CREA
TOPICAL_CREAM | Freq: Once | CUTANEOUS | Status: DC
  Filled 2022-01-08: qty 5

## 2022-01-08 MED ORDER — HYDROMORPHONE HCL 1 MG/ML IJ SOLN
1.0000 mg | INTRAMUSCULAR | Status: DC | PRN
  Administered 2022-01-08 – 2022-01-10 (×7): 1 mg via INTRAVENOUS
  Filled 2022-01-08 (×8): qty 1

## 2022-01-08 MED ORDER — LISINOPRIL 20 MG PO TABS
20.0000 mg | ORAL_TABLET | Freq: Every day | ORAL | Status: DC
  Administered 2022-01-09 – 2022-01-10 (×2): 20 mg via ORAL
  Filled 2022-01-08 (×2): qty 1

## 2022-01-08 MED ORDER — PANTOPRAZOLE SODIUM 40 MG IV SOLR
40.0000 mg | Freq: Two times a day (BID) | INTRAVENOUS | Status: DC
  Administered 2022-01-08 – 2022-01-10 (×5): 40 mg via INTRAVENOUS
  Filled 2022-01-08 (×5): qty 40

## 2022-01-08 MED ORDER — ISOSORBIDE MONONITRATE ER 60 MG PO TB24
60.0000 mg | ORAL_TABLET | Freq: Every day | ORAL | Status: DC
  Administered 2022-01-09 – 2022-01-10 (×2): 60 mg via ORAL
  Filled 2022-01-08 (×2): qty 1

## 2022-01-08 MED ORDER — HYDRALAZINE HCL 20 MG/ML IJ SOLN
10.0000 mg | Freq: Three times a day (TID) | INTRAMUSCULAR | Status: DC | PRN
  Administered 2022-01-08: 12:00:00 10 mg via INTRAVENOUS
  Filled 2022-01-08 (×2): qty 1

## 2022-01-08 MED ORDER — PROCHLORPERAZINE EDISYLATE 10 MG/2ML IJ SOLN
10.0000 mg | Freq: Four times a day (QID) | INTRAMUSCULAR | Status: DC | PRN
  Administered 2022-01-08: 18:00:00 10 mg via INTRAVENOUS
  Filled 2022-01-08: qty 2

## 2022-01-08 MED ORDER — FENTANYL CITRATE PF 50 MCG/ML IJ SOSY
100.0000 ug | PREFILLED_SYRINGE | Freq: Once | INTRAMUSCULAR | Status: AC
  Administered 2022-01-08: 03:00:00 100 ug via INTRAVENOUS
  Filled 2022-01-08: qty 2

## 2022-01-08 MED ORDER — SODIUM CHLORIDE 0.9 % IV SOLN
1.0000 g | INTRAVENOUS | Status: DC
  Administered 2022-01-08 – 2022-01-09 (×2): 1 g via INTRAVENOUS
  Filled 2022-01-08 (×2): qty 10

## 2022-01-08 MED ORDER — METOPROLOL TARTRATE 50 MG PO TABS
100.0000 mg | ORAL_TABLET | Freq: Two times a day (BID) | ORAL | Status: DC
  Administered 2022-01-08 – 2022-01-10 (×4): 100 mg via ORAL
  Filled 2022-01-08 (×3): qty 2
  Filled 2022-01-08: qty 4

## 2022-01-08 MED ORDER — INSULIN ASPART 100 UNIT/ML IJ SOLN
0.0000 [IU] | Freq: Three times a day (TID) | INTRAMUSCULAR | Status: DC
  Administered 2022-01-08 (×2): 3 [IU] via SUBCUTANEOUS
  Administered 2022-01-09: 08:00:00 2 [IU] via SUBCUTANEOUS
  Administered 2022-01-09: 13:00:00 5 [IU] via SUBCUTANEOUS
  Administered 2022-01-09: 18:00:00 3 [IU] via SUBCUTANEOUS
  Administered 2022-01-10: 11 [IU] via SUBCUTANEOUS
  Administered 2022-01-10: 3 [IU] via SUBCUTANEOUS
  Filled 2022-01-08: qty 0.15

## 2022-01-08 MED ORDER — HYDROMORPHONE HCL 1 MG/ML IJ SOLN
1.0000 mg | Freq: Once | INTRAMUSCULAR | Status: AC
  Administered 2022-01-08: 06:00:00 1 mg via INTRAVENOUS
  Filled 2022-01-08: qty 1

## 2022-01-08 MED ORDER — ENOXAPARIN SODIUM 40 MG/0.4ML IJ SOSY
40.0000 mg | PREFILLED_SYRINGE | INTRAMUSCULAR | Status: DC
  Administered 2022-01-08 – 2022-01-10 (×3): 40 mg via SUBCUTANEOUS
  Filled 2022-01-08 (×3): qty 0.4

## 2022-01-08 MED ORDER — LACTATED RINGERS IV SOLN: INTRAVENOUS | Status: AC

## 2022-01-08 MED ORDER — INSULIN ASPART 100 UNIT/ML IJ SOLN
0.0000 [IU] | Freq: Every day | INTRAMUSCULAR | Status: DC
  Administered 2022-01-09: 21:00:00 3 [IU] via SUBCUTANEOUS
  Filled 2022-01-08: qty 0.05

## 2022-01-08 MED ORDER — FUROSEMIDE 40 MG PO TABS
40.0000 mg | ORAL_TABLET | Freq: Every day | ORAL | Status: DC
  Administered 2022-01-09: 10:00:00 40 mg via ORAL
  Filled 2022-01-08 (×2): qty 1

## 2022-01-08 NOTE — ED Notes (Signed)
Pt sitting up in bed, pt states that her pain is starting to come back, pt requests pain med, pain med given, pt states that she doesn't want her port accessed, pt refused port access

## 2022-01-08 NOTE — ED Notes (Signed)
Pt in bed with eyes closed, resps even and unlabored.  

## 2022-01-08 NOTE — Progress Notes (Signed)
[name]   DOB:1956-06-03   CB#:[id number]    ASSESSMENT & PLAN:  Papillary serous adenocarcinoma of endometrium [facility])  She received first dose of carboplatin, paclitaxel and trastuzumab on January 17, complicated by early onset of nausea, vomiting, abdominal pain, consistent with either flare of pancreatitis versus duodenitis  This is not a known side effects of her chemotherapy that I am aware of, although, she did receive steroids before treatment For now, she would need bowel rest, IV fluids, pain management and supportive care  Severe abdominal pain, nausea and vomiting Continue supportive care as directed I have reviewed her CT imaging and overall, her disease is stable and is too soon to assess for response to treatment   CKD (chronic kidney disease), stage III ([place]) We discussed importance of risk factor modification while on treatment   Essential hypertension She has uncontrolled hypertension today likely due to pain Continue antihypertensives  Code Status Full code  Goals of care Resolution of abdominal pain inability to tolerate oral intake  Discharge planning She will likely be here 2 to 3 days Will follow  All questions were answered. The patient knows to call the clinic with any problems, questions or concerns.   The total time spent in the appointment was 55 minutes encounter with patients including review of chart and various tests results, discussions about plan of care and coordination of care plan  Heath Lark, MD 01/08/2022 9:03 AM  Subjective:  I was informed that the patient is being admitted for possible acute pancreatitis versus duodenitis She received chemotherapy recently for recurrent uterine cancer This morning, after receiving some pain medicine, her pain has subsided She had poor oral intake for the last 4 days and her symptoms started since end of last week She had bowel movement this morning  Objective:  Vitals:   01/08/22 0600 01/08/22  0843  BP: (!) 185/82 (!) 175/77  Pulse: 72 69  Resp: 16 17  Temp:  97.6 F (36.4 C)  SpO2: 97% 96%     Intake/Output Summary (Last 24 hours) at 01/08/2022 6160 Last data filed at 01/08/2022 0603 Gross per 24 hour  Intake 1100 ml  Output --  Net 1100 ml    GENERAL:alert, no distress and comfortable SKIN: skin color, texture, turgor are normal, no rashes or significant lesions EYES: normal, Conjunctiva are pink and non-injected, sclera clear OROPHARYNX:no exudate, no erythema and lips, buccal mucosa, and tongue normal  NECK: supple, thyroid normal size, non-tender, without nodularity LYMPH:  no palpable lymphadenopathy in the cervical, axillary or inguinal LUNGS: clear to auscultation and percussion with normal breathing effort HEART: regular rate & rhythm and no murmurs and no lower extremity edema ABDOMEN:abdomen soft, tenderness in the epigastrium region without rebound or guarding Musculoskeletal:no cyanosis of digits and no clubbing  NEURO: alert & oriented x 3 with fluent speech, no focal motor/sensory deficits   Labs:  Recent Labs    12/21/21 0948 12/25/21 1043 01/07/22 2230  NA 140 142 134*  K 3.8 3.9 4.2  CL 105 107 100  CO2 25 27 24   GLUCOSE 158* 145* 250*  BUN 20 19 19   CREATININE 1.02* 1.09* 1.27*  CALCIUM 9.4 9.4 9.0  GFRNONAA >60 57* 48*  PROT 7.4 7.3 7.5  ALBUMIN 3.7 3.8 3.5  AST 11* 11* 21  ALT 10 12 27   ALKPHOS 106 102 94  BILITOT 0.4 0.4 0.7    Studies: I have personally reviewed her CT imaging CT ABDOMEN PELVIS W CONTRAST  Result Date: 01/08/2022 CLINICAL DATA:  Abdominal pain. History of uterine/cervical cancer status post prior hysterectomy. EXAM: CT ABDOMEN AND PELVIS WITH CONTRAST TECHNIQUE: Multidetector CT imaging of the abdomen and pelvis was performed using the standard protocol following bolus administration of intravenous contrast. RADIATION DOSE REDUCTION: This exam was performed according to the departmental dose-optimization program  which includes automated exposure control, adjustment of the mA and/or kV according to patient size and/or use of iterative reconstruction technique. CONTRAST:  152mL OMNIPAQUE IOHEXOL 300 MG/ML  SOLN COMPARISON:  CT abdomen pelvis dated 12/21/2021. FINDINGS: Lower chest: The visualized lung bases are clear. There is coronary vascular calcification. No intra-abdominal free air or free fluid. Hepatobiliary: The liver is unremarkable. No intrahepatic biliary dilatation. The gallbladder is unremarkable. Pancreas: There is mild haziness adjacent to the head and uncinate process of the pancreas, likely related to inflammatory changes of the duodenum. Acute pancreatitis is not excluded correlation with pancreatic enzymes recommended. No drainable fluid collection/abscess or pseudocyst. Spleen: Normal in size without focal abnormality. Adrenals/Urinary Tract: The adrenal glands unremarkable. The kidneys, visualized ureters, and urinary bladder appear unremarkable. Stomach/Bowel: There is sigmoid diverticulosis without active inflammatory changes. There is inflammatory changes of the duodenal C-loop which may represent duodenitis. There is no bowel obstruction. The appendix is normal. Vascular/Lymphatic: Moderate aortoiliac atherosclerotic disease. The IVC is unremarkable. No portal venous gas. Slightly enlarged appearance of a mildly enlarged left pelvic sidewall lymph node measuring 14 mm short axis (66/2). Mildly enlarged lymph node medial to the left psoas measures 12 mm short axis. Reproductive: Hysterectomy. Grossly similar or slightly enlarged ovoid mass in the left pelvis measuring 3.7 x 2.1 cm (previously 3.1 x 2.5 cm) associated with left vaginal fornix. Similar appearance of the right vaginal fornix with soft tissue nodularity. Several small mildly rounded perirectal lymph nodes again noted Other: None Musculoskeletal: Degenerative changes of the spine. No acute osseous pathology. IMPRESSION: 1. Findings may  represent duodenitis versus pancreatitis. Clinical correlation is recommended. 2. Sigmoid diverticulosis. No bowel obstruction. Normal appendix. 3. Slightly enlarged soft tissue masses associated with vaginal cuff as well as increase in the size of the retroperitoneal adenopathy compared to the prior CT. 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 Clinical correlation is recommended. 4. Aortic Atherosclerosis (ICD10-I70.0). Electronically Signed   By: Anner Crete M.D.   On: 01/08/2022 00:20   CT ABDOMEN PELVIS W CONTRAST  Result Date: 12/22/2021 CLINICAL DATA:  History of uterine/cervical cancer status post hysterectomy. EXAM: CT ABDOMEN AND PELVIS WITH CONTRAST TECHNIQUE: Multidetector CT imaging of the abdomen and pelvis was performed using the standard protocol following bolus administration of intravenous contrast. CONTRAST:  37mL OMNIPAQUE IOHEXOL 350 MG/ML SOLN COMPARISON:  09/28/2021 FINDINGS: Lower chest: The lung bases are clear of acute process. No pleural effusion or pulmonary lesions. The heart is normal in size. No pericardial effusion. Stable age advanced coronary artery calcifications. The distal esophagus and aorta are unremarkable. Hepatobiliary: No hepatic lesions or evidence of peritoneal surface disease. The gallbladder is unremarkable. No intra or extrahepatic biliary dilatation. Pancreas: No mass, inflammation or ductal dilatation. Spleen: Normal size. No focal lesions. Adrenals/Urinary Tract: Adrenal glands and kidneys are unremarkable. The bladder is unremarkable. Stomach/Bowel: The stomach, duodenum, small bowel and colon are unremarkable. No inflammatory changes or obstructive findings. Stable colonic diverticulosis. Vascular/Lymphatic: Stable age advanced atherosclerotic calcifications involving the aorta and branch vessels but no aneurysm or dissection. The major venous structures are patent. Stable 7 mm left para-aortic node on image 43/2. 7.5 mm node medial to the  left common iliac  artery on image 63/2 previously measured 6 mm. Left internal iliac node on image 66/2 measures 11 mm and previously measured 8 mm. 12 mm internal iliac node on the left on image 76/2 previously measured 9 mm. New small sub 5 mm perirectal and sigmoid mesocolon nodes. Reproductive: Status post hysterectomy and bilateral oophorectomy. Other: Enlarging soft tissue mass associated with the left vaginal fornix. This measures approximately 3.1 x 2.5 cm on image 85/2 this previously measured approximately 2.0 x 1.4 cm. There is also prominence of the right vaginal fornix with slight nodularity measuring approximately 2.4 x 2.0 cm on image 84/2. This previously measured 1.7 x 1.6 cm. 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 Speculum examination and re-biopsy may be confirmatory. PET-CT may be helpful also. Musculoskeletal: No significant bony findings. IMPRESSION: 1. Enlarging soft tissue masses involving the vaginal cuff. 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 Speculum examination and re-biopsy may be confirmatory. PET-CT may be helpful. 2. New small sub 5 mm perirectal and sigmoid mesocolon nodes. 3. Slight progression of pelvic adenopathy as detailed above. 4. No findings suspicious for abdominal metastatic disease or osseous metastatic disease. 5. Stable age advanced atherosclerotic calcifications involving the aorta and branch vessels and coronary arteries. Aortic Atherosclerosis (ICD10-I70.0). Electronically Signed   By: Marijo Sanes M.D.   On: 12/22/2021 09:58   DG Chest Port 1 View  Result Date: 01/07/2022 CLINICAL DATA:  Questionable sepsis - evaluate for abnormality Nausea and weakness. EXAM: PORTABLE CHEST 1 VIEW COMPARISON:  07/09/2021 FINDINGS: Right chest port in place. Lung volumes are low. Mild vascular congestion without pulmonary edema. The heart is normal in size. Coronary stent. No focal airspace disease. No pleural effusion  or pneumothorax. No acute osseous abnormalities are seen. IMPRESSION: Low lung volumes with mild vascular congestion. Electronically Signed   By: Keith Rake M.D.   On: 01/07/2022 23:48   ECHOCARDIOGRAM COMPLETE  Result Date: 12/25/2021    ECHOCARDIOGRAM REPORT   Patient Name:   [name] Date of Exam: 12/25/2021 Medical Rec #:  [id number]     Height:       62.0 in Accession #:    [id number]    Weight:       232.6 lb Date of Birth:  04/18/1956    BSA:          2.038 m Patient Age:    27 years      BP:           132/79 mmHg Patient Gender: F             HR:           55 bpm. Exam Location:  Outpatient Procedure: Strain Analysis, 3D Echo and 2D Echo Indications:    Chemo.  History:        Patient has prior history of Echocardiogram examinations, most                 recent 02/19/2021. CAD, chronic kidney disease; Risk                 Factors:Diabetes, Dyslipidemia and Hypertension.  Sonographer:    [place] Referring Phys: [phone] Leib Elahi [place]  1. Global longitudinal strain is -16.1% Mild basal inferior hypoinesis     Compared to echo from 02/2021, no change in overall LVEF /regional wall motion. Global longitudinal strain is mildly less negative (-17.9% to -16.1%. Left ventricular ejection fraction,  by estimation, is 65 to 70%. The left ventricle has normal function. The left ventricle has no regional wall motion abnormalities. There is mild left ventricular hypertrophy. Left ventricular diastolic parameters are indeterminate.  2. Right ventricular systolic function is normal. The right ventricular size is normal. There is normal pulmonary artery systolic pressure.  3. Trivial mitral valve regurgitation.  4. The aortic valve is normal in structure. Aortic valve regurgitation is not visualized.  5. The inferior vena cava is normal in size with greater than 50% respiratory variability, suggesting right atrial pressure of 3 mmHg. FINDINGS  Left Ventricle: Global longitudinal strain is  -16.1% Mild basal inferior hypoinesis Compared to echo from 02/2021, no change in overall LVEF /regional wall motion. Global longitudinal strain is mildly less negative (-17.9% to -16.1%. Left ventricular ejection fraction, by estimation, is 65 to 70%. The left ventricle has normal function. The left ventricle has no regional wall motion abnormalities. The left ventricular internal cavity size was normal in size. There is mild left ventricular hypertrophy. Left ventricular diastolic parameters are indeterminate. Right Ventricle: The right ventricular size is normal. Right vetricular wall thickness was not assessed. Right ventricular systolic function is normal. There is normal pulmonary artery systolic pressure. The tricuspid regurgitant velocity is 2.82 m/s, and with an assumed right atrial pressure of 3 mmHg, the estimated right ventricular systolic pressure is 26.7 mmHg. Left Atrium: Left atrial size was normal in size. Right Atrium: Right atrial size was normal in size. Pericardium: There is no evidence of pericardial effusion. Mitral Valve: Mild mitral annular calcification. Trivial mitral valve regurgitation. Tricuspid Valve: The tricuspid valve is normal in structure. Tricuspid valve regurgitation is trivial. Aortic Valve: The aortic valve is normal in structure. Aortic valve regurgitation is not visualized. Pulmonic Valve: The pulmonic valve was not well visualized. Pulmonic valve regurgitation is trivial. No evidence of pulmonic stenosis. Aorta: The aortic root is normal in size and structure. Venous: The inferior vena cava is normal in size with greater than 50% respiratory variability, suggesting right atrial pressure of 3 mmHg. IAS/Shunts: No atrial level shunt detected by color flow Doppler.  LEFT VENTRICLE PLAX 2D LVIDd:         4.70 cm   Diastology LVIDs:         3.10 cm   LV e' medial:    4.35 cm/s LV PW:         1.20 cm   LV E/e' medial:  17.3 LV IVS:        0.90 cm   LV e' lateral:   5.98 cm/s LVOT  diam:     1.80 cm   LV E/e' lateral: 12.6 LV SV:         68 LV SV Index:   33 LVOT Area:     2.54 cm                           3D Volume EF:                          3D EF:        62 %                          LV EDV:       97 ml  LV ESV:       37 ml                          LV SV:        61 ml RIGHT VENTRICLE             IVC RV S prime:     12.30 cm/s  IVC diam: 1.50 cm TAPSE (M-mode): 2.0 cm LEFT ATRIUM             Index        RIGHT ATRIUM           Index LA diam:        3.90 cm 1.91 cm/m   RA Area:     11.50 cm LA Vol (A2C):   57.6 ml 28.26 ml/m  RA Volume:   23.90 ml  11.72 ml/m LA Vol (A4C):   64.1 ml 31.45 ml/m LA Biplane Vol: 63.9 ml 31.35 ml/m  AORTIC VALVE LVOT Vmax:   108.00 cm/s LVOT Vmean:  71.000 cm/s LVOT VTI:    0.266 m  AORTA Ao Root diam: 2.90 cm MITRAL VALVE               TRICUSPID VALVE MV Area (PHT): 2.17 cm    TR Peak grad:   31.8 mmHg MV Decel Time: 349 msec    TR Vmax:        282.00 cm/s MV E velocity: 75.10 cm/s MV A velocity: 98.10 cm/s  SHUNTS MV E/A ratio:  0.77        Systemic VTI:  0.27 m                            Systemic Diam: 1.80 cm Dorris Carnes MD Electronically signed by Dorris Carnes MD Signature Date/Time: 12/25/2021/1:02:21 PM    Final

## 2022-01-08 NOTE — ED Notes (Signed)
Pt ambulated to bathroom on her own

## 2022-01-08 NOTE — ED Notes (Signed)
Pt ambulatory from the bathroom, pt reports an increase in pain, pt requests pain med.

## 2022-01-08 NOTE — ED Notes (Signed)
Pt in bed with eyes closed, pt arouses easily to verbal sitm, pt denies pain at this time.

## 2022-01-08 NOTE — ED Notes (Signed)
Patient's daughter updated, per patient request and told that she will be admitted for pain control. She also requested that she receive a call when patient is moved to the floor

## 2022-01-08 NOTE — ED Notes (Signed)
ED TO INPATIENT HANDOFF REPORT  Name/Age/Gender [name] 66 y.o. female  Code Status    Code Status Orders  (From admission, onward)           Start     Ordered   01/08/22 0927  Full code  Continuous        01/08/22 0927           Code Status History     Date Active Date Inactive Code Status Order ID Comments User Context   07/10/2021 0232 07/11/2021 1636 Full Code [id number]  Kayleen Memos, DO ED   07/10/2021 0229 07/10/2021 0232 Full Code [id number]  Kayleen Memos, DO ED   01/29/2021 0814 01/30/2021 0030 Full Code [id number]  Dorothyann Gibbs, NP Inpatient   06/19/2019 0319 06/20/2019 1357 Full Code [id number]  Shela Leff, MD ED   07/18/2018 2128 07/21/2018 2029 Full Code [id number]  Toy Baker, MD Inpatient   08/26/2015 0605 08/27/2015 1649 Full Code [id number]  Jay Schlichter, MD ED   08/22/2015 1532 08/24/2015 1632 Full Code [id number]  Leonie Man, MD Inpatient   08/22/2015 0958 08/22/2015 1532 Full Code [id number]  Leonie Man, MD Inpatient   08/18/2015 1835 08/22/2015 0958 Full Code [id number]  Leanor Kail, [place] Inpatient   06/03/2013 2233 06/09/2013 2045 Full Code [id number]  Janell Quiet, MD Inpatient       Home/SNF/Other Home  Chief Complaint Abdominal pain [R10.9]  Level of Care/Admitting Diagnosis ED Disposition     ED Disposition  Admit   Condition  --   [facility]: [place] [facility] [[id number]]  Level of Care: Telemetry [5]  Admit to tele based on following criteria: Monitor for Ischemic changes  May place patient in observation at [facility] or [place] if equivalent level of care is available:: No  Covid Evaluation: Asymptomatic Screening Protocol (No Symptoms)  Diagnosis: Abdominal pain [761950]  Admitting Physician: Rise Patience [[id number]]  Attending Physician: Rise Patience [phone]          Medical History Past Medical History:  Diagnosis Date   Anginal pain ([place]) 2016   Cancer ([place])     Coronary atherosclerosis of native coronary artery, with stent to LCX in 2006 06/04/2013   Diabetes mellitus without complication ([place])    GERD (gastroesophageal reflux disease)    Heart attack ([place]) 2006   History of radiation therapy 08/16/2021   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  Dr Gery Pray   Hyperlipidemia LDL goal < 70 06/07/2013   Hypertension    Obesity    S/P coronary artery stent placement to RCA with PROMUS DES, 06/07/13, residual disease on OM1, OM2 and rPDA 06/07/2013    Allergies No Known Allergies  IV Location/Drains/Wounds Patient Lines/Drains/Airways Status     Active Line/Drains/Airways     Name Placement date Placement time Site Days   Implanted Port 02/20/21 Right Chest 02/20/21  1519  Chest  322   Peripheral IV 01/07/22 20 G Left Antecubital 01/07/22  2321  Antecubital  1   Peripheral IV 01/08/22 20 G 1.16" Anterior;Proximal;Right Forearm 01/08/22  0629  Forearm  less than 1   Incision (Closed) 01/29/21 N/A 01/29/21  1428  -- 344   Incision - 5 Ports Abdomen Right;Lateral Right;Medial Umbilicus Upper;Left Left 01/29/21  1422  -- 344            Labs/Imaging Results for orders placed or performed during the hospital encounter of 01/07/22 (from the past 48 hour(s))  Comprehensive metabolic panel     Status: Abnormal   Collection Time: 01/07/22 10:30 PM  Result Value Ref Range   Sodium 134 (L) 135 - 145 mmol/L   Potassium 4.2 3.5 - 5.1 mmol/L   Chloride 100 98 - 111 mmol/L   CO2 24 22 - 32 mmol/L   Glucose, Bld 250 (H) 70 - 99 mg/dL    Comment: Glucose reference range applies only to samples taken after fasting for at least 8 hours.   BUN 19 8 - 23 mg/dL   Creatinine, Ser 1.27 (H) 0.44 - 1.00 mg/dL   Calcium 9.0 8.9 - 10.3 mg/dL   Total Protein 7.5 6.5 - 8.1 g/dL   Albumin 3.5 3.5 - 5.0 g/dL   AST 21 15 - 41 U/L   ALT 27 0 - 44 U/L   Alkaline Phosphatase 94 38 - 126 U/L   Total Bilirubin 0.7 0.3 - 1.2 mg/dL   GFR, Estimated 48 (L) >60 mL/min     Comment: (NOTE) Calculated using the CKD-EPI Creatinine Equation (2021)    Anion gap 10 5 - 15    Comment: Performed at [facility], [place] [address] [place], [place] [address]  CBC     Status: Abnormal   Collection Time: 01/07/22 10:30 PM  Result Value Ref Range   WBC 4.2 4.0 - 10.5 K/uL   RBC 3.79 (L) 3.87 - 5.11 MIL/uL   Hemoglobin 11.1 (L) 12.0 - 15.0 g/dL   HCT 33.4 (L) 36.0 - 46.0 %   MCV 88.1 80.0 - 100.0 fL   MCH 29.3 26.0 - 34.0 pg   MCHC 33.2 30.0 - 36.0 g/dL   RDW 13.6 11.5 - 15.5 %   Platelets 204 150 - 400 K/uL   nRBC 0.0 0.0 - 0.2 %    Comment: Performed at [facility], [place] [address] [place], [place] [address]  Urinalysis, Routine w reflex microscopic     Status: Abnormal   Collection Time: 01/07/22 10:30 PM  Result Value Ref Range   Color, Urine YELLOW YELLOW   APPearance HAZY (A) CLEAR   Specific Gravity, Urine 1.020 1.005 - 1.030   pH 5.0 5.0 - 8.0   Glucose, UA 50 (A) NEGATIVE mg/dL   Hgb urine dipstick SMALL (A) NEGATIVE   Bilirubin Urine NEGATIVE NEGATIVE   Ketones, ur 20 (A) NEGATIVE mg/dL   Protein, ur >=300 (A) NEGATIVE mg/dL   Nitrite NEGATIVE NEGATIVE   Leukocytes,Ua MODERATE (A) NEGATIVE   RBC / HPF 6-10 0 - 5 RBC/hpf   WBC, UA >50 (H) 0 - 5 WBC/hpf   Bacteria, UA NONE SEEN NONE SEEN   Squamous Epithelial / LPF 6-10 0 - 5   WBC Clumps PRESENT    Mucus PRESENT    Hyaline Casts, UA PRESENT     Comment: Performed at [facility], [place] [address] [place], [place] [address]  CK     Status: None   Collection Time: 01/07/22 10:30 PM  Result Value Ref Range   Total CK 56 38 - 234 U/L    Comment: Performed at [facility], [place] [address], [place], [place] [address]  Protime-INR     Status: None   Collection Time: 01/07/22 10:30 PM  Result Value Ref Range   Prothrombin Time 13.1 11.4 - 15.2 seconds   INR 1.0 0.8 - 1.2    Comment: (NOTE) INR goal varies based on device  and disease states. Performed at  [facility], [place] [address] [place], [place] [address]   APTT     Status: None   Collection Time: 01/07/22 10:30 PM  Result Value Ref Range   aPTT 26 24 - 36 seconds    Comment: Performed at [facility], [place] [address], [place], [place] [address]  Lipase, blood     Status: None   Collection Time: 01/07/22 10:30 PM  Result Value Ref Range   Lipase 30 11 - 51 U/L    Comment: Performed at [facility], [place] [address], [place], [place] [address]  Resp Panel by RT-PCR (Flu A&B, Covid) Nasopharyngeal Swab     Status: None   Collection Time: 01/07/22 11:23 PM   Specimen: Nasopharyngeal Swab; Nasopharyngeal(NP) swabs in vial transport medium  Result Value Ref Range   SARS Coronavirus 2 by RT PCR NEGATIVE NEGATIVE    Comment: (NOTE) SARS-CoV-2 target nucleic acids are NOT DETECTED.  The SARS-CoV-2 RNA is generally detectable in upper respiratory specimens during the acute phase of infection. The lowest concentration of SARS-CoV-2 viral copies this assay can detect is 138 copies/mL. A negative result does not preclude SARS-Cov-2 infection and should not be used as the sole basis for treatment or other patient management decisions. A negative result may occur with  improper specimen collection/handling, submission of specimen other than nasopharyngeal swab, presence of viral mutation(s) within the areas targeted by this assay, and inadequate number of viral copies(<138 copies/mL). A negative result must be combined with clinical observations, patient history, and epidemiological information. The expected result is Negative.  Fact Sheet for Patients:  EntrepreneurPulse.com.au  Fact Sheet for Healthcare Providers:  IncredibleEmployment.be  This test is no t yet approved or cleared by the [place] FDA and  has been authorized for detection and/or diagnosis  of SARS-CoV-2 by FDA under an Emergency Use Authorization (EUA). This EUA will remain  in effect (meaning this test can be used) for the duration of the COVID-19 declaration under Section 564(b)(1) of the Act, 21 U.S.C.section 360bbb-3(b)(1), unless the authorization is terminated  or revoked sooner.       Influenza A by PCR NEGATIVE NEGATIVE   Influenza B by PCR NEGATIVE NEGATIVE    Comment: (NOTE) The Xpert Xpress SARS-CoV-2/FLU/RSV plus assay is intended as an aid in the diagnosis of influenza from Nasopharyngeal swab specimens and should not be used as a sole basis for treatment. Nasal washings and aspirates are unacceptable for Xpert Xpress SARS-CoV-2/FLU/RSV testing.  Fact Sheet for Patients: EntrepreneurPulse.com.au  Fact Sheet for Healthcare Providers: IncredibleEmployment.be  This test is not yet approved or cleared by the [place] FDA and has been authorized for detection and/or diagnosis of SARS-CoV-2 by FDA under an Emergency Use Authorization (EUA). This EUA will remain in effect (meaning this test can be used) for the duration of the COVID-19 declaration under Section 564(b)(1) of the Act, 21 U.S.C. section 360bbb-3(b)(1), unless the authorization is terminated or revoked.  Performed at [facility], [place] [address] [place], [place] [address]   Lactic acid, plasma     Status: None   Collection Time: 01/08/22 12:15 AM  Result Value Ref Range   Lactic Acid, Venous 0.9 0.5 - 1.9 mmol/L    Comment: Performed at [facility], [place] [address] [place], [place] [address]  Creatinine, serum     Status: Abnormal   Collection Time: 01/08/22 10:00 AM  Result Value Ref Range   Creatinine, Ser 1.05 (H)  0.44 - 1.00 mg/dL   GFR, Estimated 60 (L) >60 mL/min    Comment: (NOTE) Calculated using the CKD-EPI Creatinine Equation (2021) Performed at [facility], [place] [address], [place], [place] [address]   CBC     Status: Abnormal   Collection Time: 01/08/22 10:00 AM  Result Value Ref Range   WBC 2.3 (L) 4.0 - 10.5 K/uL   RBC 3.45 (L) 3.87 - 5.11 MIL/uL   Hemoglobin 10.2 (L) 12.0 - 15.0 g/dL   HCT 30.7 (L) 36.0 - 46.0 %   MCV 89.0 80.0 - 100.0 fL   MCH 29.6 26.0 - 34.0 pg   MCHC 33.2 30.0 - 36.0 g/dL   RDW 13.6 11.5 - 15.5 %   Platelets 179 150 - 400 K/uL   nRBC 0.0 0.0 - 0.2 %    Comment: Performed at [facility], [place] [address] [place], [place] [address]  CBG monitoring, ED     Status: Abnormal   Collection Time: 01/08/22 11:39 AM  Result Value Ref Range   Glucose-Capillary 186 (H) 70 - 99 mg/dL    Comment: Glucose reference range applies only to samples taken after fasting for at least 8 hours.   CT ABDOMEN PELVIS W CONTRAST  Result Date: 01/08/2022 CLINICAL DATA:  Abdominal pain. History of uterine/cervical cancer status post prior hysterectomy. EXAM: CT ABDOMEN AND PELVIS WITH CONTRAST TECHNIQUE: Multidetector CT imaging of the abdomen and pelvis was performed using the standard protocol following bolus administration of intravenous contrast. RADIATION DOSE REDUCTION: This exam was performed according to the departmental dose-optimization program which includes automated exposure control, adjustment of the mA and/or kV according to patient size and/or use of iterative reconstruction technique. CONTRAST:  173mL OMNIPAQUE IOHEXOL 300 MG/ML  SOLN COMPARISON:  CT abdomen pelvis dated 12/21/2021. FINDINGS: Lower chest: The visualized lung bases are clear. There is coronary vascular calcification. No intra-abdominal free air or free fluid. Hepatobiliary: The liver is unremarkable. No intrahepatic biliary dilatation. The gallbladder is unremarkable. Pancreas: There is mild haziness adjacent to the head and uncinate process of the pancreas, likely related to inflammatory changes of the duodenum. Acute pancreatitis is not excluded correlation with  pancreatic enzymes recommended. No drainable fluid collection/abscess or pseudocyst. Spleen: Normal in size without focal abnormality. Adrenals/Urinary Tract: The adrenal glands unremarkable. The kidneys, visualized ureters, and urinary bladder appear unremarkable. Stomach/Bowel: There is sigmoid diverticulosis without active inflammatory changes. There is inflammatory changes of the duodenal C-loop which may represent duodenitis. There is no bowel obstruction. The appendix is normal. Vascular/Lymphatic: Moderate aortoiliac atherosclerotic disease. The IVC is unremarkable. No portal venous gas. Slightly enlarged appearance of a mildly enlarged left pelvic sidewall lymph node measuring 14 mm short axis (66/2). Mildly enlarged lymph node medial to the left psoas measures 12 mm short axis. Reproductive: Hysterectomy. Grossly similar or slightly enlarged ovoid mass in the left pelvis measuring 3.7 x 2.1 cm (previously 3.1 x 2.5 cm) associated with left vaginal fornix. Similar appearance of the right vaginal fornix with soft tissue nodularity. Several small mildly rounded perirectal lymph nodes again noted Other: None Musculoskeletal: Degenerative changes of the spine. No acute osseous pathology. IMPRESSION: 1. Findings may represent duodenitis versus pancreatitis. Clinical correlation is recommended. 2. Sigmoid diverticulosis. No bowel obstruction. Normal appendix. 3. Slightly enlarged soft tissue masses associated with vaginal cuff as well as increase in the size of the retroperitoneal adenopathy compared to the prior CT. 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 Clinical correlation is recommended. 4. Aortic  Atherosclerosis (ICD10-I70.0). Electronically Signed   By: Anner Crete M.D.   On: 01/08/2022 00:20   DG Chest Port 1 View  Result Date: 01/07/2022 CLINICAL DATA:  Questionable sepsis - evaluate for abnormality Nausea and weakness. EXAM: PORTABLE CHEST 1 VIEW COMPARISON:  07/09/2021 FINDINGS: Right chest  port in place. Lung volumes are low. Mild vascular congestion without pulmonary edema. The heart is normal in size. Coronary stent. No focal airspace disease. No pleural effusion or pneumothorax. No acute osseous abnormalities are seen. IMPRESSION: Low lung volumes with mild vascular congestion. Electronically Signed   By: Keith Rake M.D.   On: 01/07/2022 23:48    Pending Labs Unresulted Labs (From admission, onward)     Start     Ordered   01/15/22 0500  Creatinine, serum  (enoxaparin (LOVENOX)    CrCl >/= 30 ml/min)  Weekly,   R     Comments: while on enoxaparin therapy    01/08/22 0927   01/09/22 0500  Comprehensive metabolic panel  Tomorrow morning,   R        01/08/22 0927   01/09/22 0500  CBC  Tomorrow morning,   R        01/08/22 0927   01/08/22 0927  Hemoglobin A1c  Once,   R       Comments: To assess prior glycemic control    01/08/22 0927   01/08/22 0927  Gastrointestinal Panel by PCR , Stool  (Gastrointestinal Panel by PCR, Stool                                                                                                                                                     **Does Not include CLOSTRIDIUM DIFFICILE testing. **If CDIFF testing is needed, place order from the "C Difficile Testing" order set.**)  Once,   R        01/08/22 0927   01/07/22 2322  Blood Culture (routine x 2)  (Undifferentiated presentation (screening labs and basic nursing orders))  BLOOD CULTURE X 2,   STAT      01/07/22 2322   01/07/22 2322  Urine Culture  (Undifferentiated presentation (screening labs and basic nursing orders))  ONCE - STAT,   STAT       Question:  Indication  Answer:  Sepsis   01/07/22 2322            Vitals/Pain Today's Vitals   01/08/22 1149 01/08/22 1400 01/08/22 1400 01/08/22 1440  BP: (!) 185/87 (!) 158/94 (!) 179/102   Pulse: 69 97 92   Resp: 18  19   Temp:   (!) 97.5 F (36.4 C)   TempSrc:   Oral   SpO2: 98% 98% 97%   PainSc: 0-No pain  6  3      Isolation Precautions Enteric precautions (UV disinfection)  Medications Medications  lactated ringers infusion ( Intravenous New Bag/Given 01/08/22 0959)  pantoprazole (PROTONIX) injection 40 mg (40 mg Intravenous Given 01/08/22 0953)  prochlorperazine (COMPAZINE) injection 10 mg (has no administration in time range)  HYDROmorphone (DILAUDID) injection 1 mg (1 mg Intravenous Given 01/08/22 1405)  hydrALAZINE (APRESOLINE) injection 10 mg (10 mg Intravenous Given 01/08/22 1151)  lidocaine-prilocaine (EMLA) cream ( Topical Not Given 01/08/22 0952)  atorvastatin (LIPITOR) tablet 80 mg (has no administration in time range)  furosemide (LASIX) tablet 40 mg (has no administration in time range)  isosorbide mononitrate (IMDUR) 24 hr tablet 60 mg (has no administration in time range)  lisinopril (ZESTRIL) tablet 20 mg (has no administration in time range)  metoprolol tartrate (LOPRESSOR) tablet 100 mg (has no administration in time range)  insulin aspart (novoLOG) injection 0-15 Units (3 Units Subcutaneous Given 01/08/22 1147)  insulin aspart (novoLOG) injection 0-5 Units (has no administration in time range)  enoxaparin (LOVENOX) injection 40 mg (40 mg Subcutaneous Given 01/08/22 0952)  cefTRIAXone (ROCEPHIN) 1 g in sodium chloride 0.9 % 100 mL IVPB (has no administration in time range)  aspirin EC tablet 81 mg (has no administration in time range)  fentaNYL (SUBLIMAZE) injection 100 mcg (100 mcg Intravenous Given 01/07/22 2347)  ondansetron (ZOFRAN) injection 4 mg (4 mg Intravenous Given 01/07/22 2347)  lactated ringers bolus 1,000 mL (0 mLs Intravenous Stopped 01/08/22 0603)  cefTRIAXone (ROCEPHIN) 1 g in sodium chloride 0.9 % 100 mL IVPB (0 g Intravenous Stopped 01/08/22 0158)  iohexol (OMNIPAQUE) 300 MG/ML solution 100 mL (100 mLs Intravenous Contrast Given 01/07/22 2358)  fentaNYL (SUBLIMAZE) injection 100 mcg (100 mcg Intravenous Given 01/08/22 0313)  HYDROmorphone (DILAUDID) injection 1 mg (1 mg  Intravenous Given 01/08/22 0603)    Mobility walks with person assist

## 2022-01-08 NOTE — ED Notes (Signed)
Pt in bed, daughter at bedside, pt denies pain or nausea at this time.

## 2022-01-08 NOTE — H&P (Signed)
History and Physical    [name] VZD:[id number] DOB: 1956-06-23 DOA: 01/07/2022  PCP: [name], MD  Patient coming from: Home  Chief Complaint: abdominal pain, N/V  HPI: [name] is a 66 y.o. female with medical history significant of CAD, Dm2, HTN, HLD, GERD, endometrial cancer. Presenting with abdominal pain, N/V. She had her last chemo session 1 week ago. Since that time, she has had N/V/D. She has tried anti-emetics, but she has not been able to keep them down. She has developed sharp, epigastric pain over the last few days. She has tried APAP, but it hasn't helped. When her symptoms did not improve last night, she decided to come to the ED for help. She denies any other aggravating or alleviating factors.   ED Course: She was noted to have pyuria. She was started on rocephin. She was given fluids, anti-emetics, and pain meds. Her pain was not controlled. TRH was called for admission.   Review of Systems:  Denies CP, palpitations, dyspnea, fever, sick contacts. Review of systems is otherwise negative for all not mentioned in HPI.   PMHx Past Medical History:  Diagnosis Date   Anginal pain ([place]) 2016   Cancer [facility])    Coronary atherosclerosis of native coronary artery, with stent to LCX in 2006 06/04/2013   Diabetes mellitus without complication ([place])    GERD (gastroesophageal reflux disease)    Heart attack ([place]) 2006   History of radiation therapy 08/16/2021   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  Dr Gery Pray   Hyperlipidemia LDL goal < 70 06/07/2013   Hypertension    Obesity    S/P coronary artery stent placement to RCA with PROMUS DES, 06/07/13, residual disease on OM1, OM2 and rPDA 06/07/2013    PSHx Past Surgical History:  Procedure Laterality Date   CARDIAC CATHETERIZATION N/A 08/22/2015   Procedure: Left Heart Cath and Coronary Angiography;  Surgeon: Leonie Man, MD;  Location: [place] [facility];  Service: Cardiovascular;  Laterality: N/A;    CARDIAC CATHETERIZATION N/A 08/22/2015   Procedure: Coronary Stent Intervention;  Surgeon: Leonie Man, MD;  Location: [place] [facility];  Service: Cardiovascular;  Laterality: N/A;   CARDIAC CATHETERIZATION N/A 08/22/2015   Procedure: Left Heart Cath and Coronary Angiography;  Surgeon: Leonie Man, MD;  Location: [place] [facility];  Service: Cardiovascular;  Laterality: N/A;   CORONARY STENT PLACEMENT  2006   TAXUS stent CFX in [facility]   IR IMAGING GUIDED PORT INSERTION  02/20/2021   LEFT HEART CATH AND CORONARY ANGIOGRAPHY N/A 07/21/2018   Procedure: LEFT HEART CATH AND CORONARY ANGIOGRAPHY;  Surgeon: Troy Sine, MD;  Location: [place] [facility];  Service: Cardiovascular;  Laterality: N/A;   LEFT HEART CATHETERIZATION WITH CORONARY ANGIOGRAM N/A 06/07/2013   Procedure: LEFT HEART CATHETERIZATION WITH CORONARY ANGIOGRAM;  Surgeon: Sanda Klein, MD;  Location: [place] [facility];  Service: Cardiovascular;  Laterality: N/A;   LYMPH NODE DISSECTION N/A 01/29/2021   Procedure: SELECTIVE LYMPH NODE DISSECTION;  Surgeon: Lafonda Mosses, MD;  Location: [facility];  Service: Gynecology;  Laterality: N/A;   ROBOTIC ASSISTED TOTAL HYSTERECTOMY WITH BILATERAL SALPINGO OOPHERECTOMY Bilateral 01/29/2021   Procedure: XI ROBOTIC ASSISTED TOTAL HYSTERECTOMY WITH BILATERAL SALPINGO OOPHORECTOMY,;  Surgeon: Lafonda Mosses, MD;  Location: [facility];  Service: Gynecology;  Laterality: Bilateral;   SENTINEL NODE BIOPSY N/A 01/29/2021   Procedure: SENTINEL NODE BIOPSY;  Surgeon: Lafonda Mosses, MD;  Location: [facility];  Service: Gynecology;  Laterality:  N/A;    SocHx  reports that she quit smoking about 17 years ago. Her smoking use included cigars. She has never used smokeless tobacco. She reports current alcohol use. She reports that she does not use drugs.  No Known Allergies  FamHx Family History  Problem Relation Age of Onset   Diabetes Mother    Hypertension Mother    Diabetes Sister     Diabetes Brother    Hypertension Brother    Colon cancer Father    Breast cancer Neg Hx    Ovarian cancer Neg Hx    Endometrial cancer Neg Hx    Prostate cancer Neg Hx    Pancreatic cancer Neg Hx     Prior to Admission medications   Medication Sig Start Date End Date Taking? Authorizing Provider  aspirin 81 MG EC tablet Take 1 tablet (81 mg total) by mouth daily. 07/13/21  Yes Lavina Hamman, MD  atorvastatin (LIPITOR) 80 MG tablet TAKE 1 TABLET (80 MG TOTAL) BY MOUTH AT BEDTIME. 06/20/21 06/20/22 Yes Mayers, Cari S, PA-C  dexamethasone (DECADRON) 4 MG tablet Take 2 tablets by mouth the night before and 2 tablets the morning of chemotherapy, every 3 weeks x 6 cycles 12/24/21  Yes Gorsuch, Ni, MD  Dulaglutide (TRULICITY) 1.5 GY/6.5LD SOPN Inject 1.5 mg into the skin once a week. Patient taking differently: Inject 1.5 mg into the skin once a week. Friday/Saturday 06/20/21  Yes Mayers, Cari S, PA-C  furosemide (LASIX) 20 MG tablet Take 2 tablets (40 mg total) by mouth daily. 06/20/21 01/08/23 Yes Mayers, Cari S, PA-C  glimepiride (AMARYL) 4 MG tablet Take 1 tablet (4 mg total) by mouth daily with breakfast. 09/20/21 09/20/22 Yes [name], MD  isosorbide mononitrate (IMDUR) 30 MG 24 hr tablet Take 2 tablets (60 mg total) by mouth daily. 06/20/21 01/08/23 Yes Mayers, Cari S, PA-C  metFORMIN (GLUCOPHAGE) 500 MG tablet Take 2 tablets (1,000 mg total) by mouth 2 (two) times daily with a meal. 06/20/21 01/08/23 Yes Mayers, Cari S, PA-C  metoprolol tartrate (LOPRESSOR) 100 MG tablet TAKE 1 TABLET (100 MG TOTAL) BY MOUTH 2 (TWO) TIMES DAILY. 06/20/21 06/20/22 Yes Mayers, Cari S, PA-C  ondansetron (ZOFRAN) 8 MG tablet Take 1 tablet by mouth 2  times daily as needed for refractory nausea / vomiting. Start on day 3 after carboplatin chemo. 12/24/21  Yes Gorsuch, Ni, MD  pantoprazole (PROTONIX) 40 MG tablet Take 1 tablet (40 mg total) by mouth daily. 09/13/21 01/08/23 Yes [name], MD  prochlorperazine (COMPAZINE) 10 MG  tablet Take 1 tablet by mouth every 6  hours as needed (Nausea or vomiting). 12/24/21  Yes Gorsuch, Ni, MD  lisinopril (ZESTRIL) 20 MG tablet TAKE 1 TABLET BY MOUTH ONCE A DAY 06/20/21 06/20/22  Mayers, Cari S, PA-C  nitroGLYCERIN (NITROSTAT) 0.4 MG SL tablet Place 1 tablet (0.4 mg total) under the tongue every 5 (five) minutes as needed for chest pain. Patient not taking: Reported on 01/08/2022 08/24/15   Erlene Quan, PA-C    Physical Exam: Vitals:   01/08/22 0300 01/08/22 0412 01/08/22 0500 01/08/22 0600  BP: (!) 188/91 (!) 178/87 (!) 190/89 (!) 185/82  Pulse: 79 82 88 72  Resp: 16 16 16 16   Temp:      TempSrc:      SpO2: 97% 94% 97% 97%    General: 66 y.o. female resting in bed in NAD Eyes: PERRL, normal sclera ENMT: Nares patent w/o discharge, orophaynx clear, dentition normal, ears w/o  discharge/lesions/ulcers Neck: Supple, trachea midline Cardiovascular: RRR, +S1, S2, no m/g/r, equal pulses throughout Respiratory: CTABL, no w/r/r, normal WOB GI: BS+, ND, epigastric TTP, no masses noted, no organomegaly noted MSK: No e/c/c Neuro: A&O x 3, no focal deficits Psyc: Appropriate interaction and affect, calm/cooperative  Labs on Admission: I have personally reviewed following labs and imaging studies  CBC: Recent Labs  Lab 01/07/22 2230  WBC 4.2  HGB 11.1*  HCT 33.4*  MCV 88.1  PLT 161   Basic Metabolic Panel: Recent Labs  Lab 01/07/22 2230  NA 134*  K 4.2  CL 100  CO2 24  GLUCOSE 250*  BUN 19  CREATININE 1.27*  CALCIUM 9.0   GFR: Estimated Creatinine Clearance: 52 mL/min (A) (by C-G formula based on SCr of 1.27 mg/dL (H)). Liver Function Tests: Recent Labs  Lab 01/07/22 2230  AST 21  ALT 27  ALKPHOS 94  BILITOT 0.7  PROT 7.5  ALBUMIN 3.5   Recent Labs  Lab 01/07/22 2230  LIPASE 30   No results for input(s): AMMONIA in the last 168 hours. Coagulation Profile: Recent Labs  Lab 01/07/22 2230  INR 1.0   Cardiac Enzymes: Recent Labs  Lab  01/07/22 2230  CKTOTAL 56   BNP (last 3 results) No results for input(s): PROBNP in the last 8760 hours. HbA1C: No results for input(s): HGBA1C in the last 72 hours. CBG: No results for input(s): GLUCAP in the last 168 hours. Lipid Profile: No results for input(s): CHOL, HDL, LDLCALC, TRIG, CHOLHDL, LDLDIRECT in the last 72 hours. Thyroid Function Tests: No results for input(s): TSH, T4TOTAL, FREET4, T3FREE, THYROIDAB in the last 72 hours. Anemia Panel: No results for input(s): VITAMINB12, FOLATE, FERRITIN, TIBC, IRON, RETICCTPCT in the last 72 hours. Urine analysis:    Component Value Date/Time   COLORURINE YELLOW 01/07/2022 2230   APPEARANCEUR HAZY (A) 01/07/2022 2230   LABSPEC 1.020 01/07/2022 2230   PHURINE 5.0 01/07/2022 2230   GLUCOSEU 50 (A) 01/07/2022 2230   HGBUR SMALL (A) 01/07/2022 2230   BILIRUBINUR NEGATIVE 01/07/2022 2230   KETONESUR 20 (A) 01/07/2022 2230   PROTEINUR >=300 (A) 01/07/2022 2230   UROBILINOGEN 0.2 07/29/2015 1900   NITRITE NEGATIVE 01/07/2022 2230   LEUKOCYTESUR MODERATE (A) 01/07/2022 2230    Radiological Exams on Admission: CT ABDOMEN PELVIS W CONTRAST  Result Date: 01/08/2022 CLINICAL DATA:  Abdominal pain. History of uterine/cervical cancer status post prior hysterectomy. EXAM: CT ABDOMEN AND PELVIS WITH CONTRAST TECHNIQUE: Multidetector CT imaging of the abdomen and pelvis was performed using the standard protocol following bolus administration of intravenous contrast. RADIATION DOSE REDUCTION: This exam was performed according to the departmental dose-optimization program which includes automated exposure control, adjustment of the mA and/or kV according to patient size and/or use of iterative reconstruction technique. CONTRAST:  151mL OMNIPAQUE IOHEXOL 300 MG/ML  SOLN COMPARISON:  CT abdomen pelvis dated 12/21/2021. FINDINGS: Lower chest: The visualized lung bases are clear. There is coronary vascular calcification. No intra-abdominal free air  or free fluid. Hepatobiliary: The liver is unremarkable. No intrahepatic biliary dilatation. The gallbladder is unremarkable. Pancreas: There is mild haziness adjacent to the head and uncinate process of the pancreas, likely related to inflammatory changes of the duodenum. Acute pancreatitis is not excluded correlation with pancreatic enzymes recommended. No drainable fluid collection/abscess or pseudocyst. Spleen: Normal in size without focal abnormality. Adrenals/Urinary Tract: The adrenal glands unremarkable. The kidneys, visualized ureters, and urinary bladder appear unremarkable. Stomach/Bowel: There is sigmoid diverticulosis without active inflammatory changes. There  is inflammatory changes of the duodenal C-loop which may represent duodenitis. There is no bowel obstruction. The appendix is normal. Vascular/Lymphatic: Moderate aortoiliac atherosclerotic disease. The IVC is unremarkable. No portal venous gas. Slightly enlarged appearance of a mildly enlarged left pelvic sidewall lymph node measuring 14 mm short axis (66/2). Mildly enlarged lymph node medial to the left psoas measures 12 mm short axis. Reproductive: Hysterectomy. Grossly similar or slightly enlarged ovoid mass in the left pelvis measuring 3.7 x 2.1 cm (previously 3.1 x 2.5 cm) associated with left vaginal fornix. Similar appearance of the right vaginal fornix with soft tissue nodularity. Several small mildly rounded perirectal lymph nodes again noted Other: None Musculoskeletal: Degenerative changes of the spine. No acute osseous pathology. IMPRESSION: 1. Findings may represent duodenitis versus pancreatitis. Clinical correlation is recommended. 2. Sigmoid diverticulosis. No bowel obstruction. Normal appendix. 3. Slightly enlarged soft tissue masses associated with vaginal cuff as well as increase in the size of the retroperitoneal adenopathy compared to the prior CT. 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 Clinical correlation is recommended.  4. Aortic Atherosclerosis (ICD10-I70.0). Electronically Signed   By: Anner Crete M.D.   On: 01/08/2022 00:20   DG Chest Port 1 View  Result Date: 01/07/2022 CLINICAL DATA:  Questionable sepsis - evaluate for abnormality Nausea and weakness. EXAM: PORTABLE CHEST 1 VIEW COMPARISON:  07/09/2021 FINDINGS: Right chest port in place. Lung volumes are low. Mild vascular congestion without pulmonary edema. The heart is normal in size. Coronary stent. No focal airspace disease. No pleural effusion or pneumothorax. No acute osseous abnormalities are seen. IMPRESSION: Low lung volumes with mild vascular congestion. Electronically Signed   By: Keith Rake M.D.   On: 01/07/2022 23:48    EKG: Independently reviewed. Sinus tach, no st elevations  Assessment/Plan Duodenitis vs Pancreatitis N/V     - placed in obs, tele     - continue fluids, anti-emetics, pain control     - lipase is normal     - continue protonix  Pyuria vs UTI     - UA is dirty     - she notes that her urine has been concentrated     - started on rocephin in ED     - follow UCx  Diarrhea     - last BM was last night     - check GI PCR; if negative, then imodium  DM2     - SSI, A1c, glucose checks  HTN     - resume home regimen when tolerating PO; for now PRNs available  Endometrial CA     - imaging as above     - per Dr. Calton Dach last note, she has concern that there is recurrence; continue outpt follow up  Chronic diastolic HF     - watch daily wts, I&O     - holding home diuretic for today d/t dehydration  CKD3a     - at baseline, follow  DVT prophylaxis: lovenox  Code Status: FULL  Family Communication: None at bedside  Consults called: None   Status is: Observation  The patient remains OBS appropriate and will d/c before 2 midnights.  Jonnie Finner DO [facility]  If 7PM-7AM, please contact night-coverage [email or web address]  01/08/2022, 7:28 AM

## 2022-01-08 NOTE — ED Provider Notes (Signed)
[facility] DEPT Provider Note   CSN: [id number] Arrival date & time: 01/07/22  2135     History  Chief Complaint - vomiting, weakness  [name] is a 66 y.o. female.  The history is provided by the patient and a relative.  Weakness Severity:  Moderate Onset quality:  Gradual Duration:  3 days Timing:  Constant Progression:  Worsening Chronicity:  New Relieved by:  Nothing Worsened by:  Nothing Associated symptoms: abdominal pain, diarrhea, myalgias, nausea, shortness of breath and vomiting   Associated symptoms: no chest pain, no dysuria, no fever and no hematochezia    Patient with history of uterine cancer on current chemotherapy, history of CAD, diabetes presents with abdominal pain, vomiting and diarrhea.  She also reports generalized weakness.  She reports symptoms of been ongoing for the past 3 days.  She reports she is unable to take any p.o. fluids. She reports recurrent abdominal pain. 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 Authorizing Provider  aspirin 81 MG EC tablet Take 1 tablet (81 mg total) by mouth daily. 07/13/21  Yes Lavina Hamman, MD  atorvastatin (LIPITOR) 80 MG tablet TAKE 1 TABLET (80 MG TOTAL) BY MOUTH AT  BEDTIME. 06/20/21 06/20/22 Yes Mayers, Cari S, PA-C  dexamethasone (DECADRON) 4 MG tablet Take 2 tablets by mouth the night before and 2 tablets the morning of chemotherapy, every 3 weeks x 6 cycles 12/24/21  Yes Gorsuch, Ni, MD  Dulaglutide (TRULICITY) 1.5 PN/3.6RW SOPN Inject 1.5 mg into the skin once a week. Patient taking differently: Inject 1.5 mg into the skin once a week. Friday/Saturday 06/20/21  Yes Mayers, Cari S, PA-C  furosemide (LASIX) 20 MG tablet Take 2 tablets (40 mg total) by mouth daily. 06/20/21 01/08/23 Yes Mayers, Cari S, PA-C  glimepiride (AMARYL) 4 MG tablet Take 1 tablet (4 mg total) by mouth daily with breakfast. 09/20/21 09/20/22 Yes [name], MD  isosorbide mononitrate (IMDUR) 30 MG 24 hr tablet Take 2 tablets (60 mg total) by mouth daily. 06/20/21 01/08/23 Yes Mayers, Cari S, PA-C  metFORMIN (GLUCOPHAGE) 500 MG tablet Take 2 tablets (1,000 mg total) by mouth 2 (two) times daily with a meal. 06/20/21 01/08/23 Yes Mayers, Cari S, PA-C  metoprolol tartrate (LOPRESSOR) 100 MG tablet TAKE 1 TABLET (100 MG TOTAL) BY MOUTH 2 (TWO) TIMES DAILY. 06/20/21 06/20/22 Yes Mayers, Cari S, PA-C  ondansetron (ZOFRAN) 8 MG tablet Take 1 tablet by mouth 2  times daily as needed for refractory nausea / vomiting. Start on day 3 after carboplatin chemo. 12/24/21  Yes Gorsuch, Ni, MD  pantoprazole (PROTONIX) 40 MG tablet Take 1 tablet (40 mg total) by mouth daily. 09/13/21 01/08/23 Yes [name], MD  prochlorperazine (COMPAZINE) 10 MG tablet Take 1 tablet by mouth every 6  hours as needed (Nausea or vomiting). 12/24/21  Yes Gorsuch, Ni, MD  lisinopril (ZESTRIL) 20 MG tablet TAKE 1 TABLET BY MOUTH ONCE A DAY 06/20/21 06/20/22  Mayers, Cari S, PA-C  nitroGLYCERIN (NITROSTAT) 0.4 MG SL tablet Place 1 tablet (0.4 mg total) under the tongue every 5 (five) minutes as needed for chest pain. Patient not taking: Reported on 01/08/2022 08/24/15   Erlene Quan, PA-C      Allergies    Patient has no known allergies.     Review of Systems   Review of Systems  Constitutional:  Negative for fever.  Respiratory:  Positive for shortness of breath.   Cardiovascular:  Negative for chest pain.  Gastrointestinal:  Positive for abdominal pain, diarrhea, nausea and vomiting. Negative for hematochezia.  Genitourinary:  Negative for dysuria.  Musculoskeletal:  Positive for myalgias.  Neurological:  Positive for weakness.  All other systems reviewed and are negative.  Physical Exam Updated Vital Signs BP (!) 185/82    Pulse 72    Temp 98.5 F (36.9 C) (Oral)    Resp 16    SpO2 97%  Physical Exam CONSTITUTIONAL: Ill-appearing, uncomfortable appearing HEAD: Normocephalic/atraumatic EYES: EOMI/PERRL ENMT: Mucous membranes moist NECK: supple no meningeal signs SPINE/BACK:entire spine nontender CV: S1/S2 noted, no murmurs/rubs/gallops noted LUNGS: Lungs are clear to auscultation bilaterally, no apparent distress ABDOMEN: soft, moderate diffuse tenderness, no rebound or guarding, bowel sounds noted throughout abdomen GU:no cva tenderness NEURO: Pt is awake/alert/appropriate, moves all extremitiesx4.  No facial droop.   EXTREMITIES: pulses normal/equal, full ROM SKIN: warm, color normal PSYCH: no abnormalities of mood noted, alert and oriented to situation  ED Results / Procedures / Treatments   Labs (all labs ordered are listed, but only abnormal results are displayed) Labs Reviewed  COMPREHENSIVE METABOLIC PANEL - Abnormal; Notable for the following components:      Result Value   Sodium 134 (*)    Glucose, Bld 250 (*)    Creatinine, Ser 1.27 (*)    GFR, Estimated 48 (*)    All other components within normal limits  CBC - Abnormal; Notable for the following components:   RBC 3.79 (*)    Hemoglobin 11.1 (*)    HCT 33.4 (*)    All other components within normal limits  URINALYSIS, ROUTINE W REFLEX MICROSCOPIC - Abnormal; Notable for the following components:   APPearance HAZY (*)    Glucose, UA 50  (*)    Hgb urine dipstick SMALL (*)    Ketones, ur 20 (*)    Protein, ur >=300 (*)    Leukocytes,Ua MODERATE (*)    WBC, UA >50 (*)    All other components within normal limits  RESP PANEL BY RT-PCR (FLU A&B, COVID) ARPGX2  CULTURE, BLOOD (ROUTINE X 2)  CULTURE, BLOOD (ROUTINE X 2)  URINE CULTURE  CK  LACTIC ACID, PLASMA  PROTIME-INR  APTT  LIPASE, BLOOD    EKG EKG Interpretation  Date/Time:  Monday January 07 2022 21:50:55 EST Ventricular Rate:  111 PR Interval:  114 QRS Duration: 81 QT Interval:  342 QTC Calculation: 465 R Axis:   77 Text Interpretation: Sinus tachycardia Ventricular premature complex Aberrant complex Confirmed by Ripley Fraise [phone]) on 01/07/2022 11:04:47 PM  Radiology CT ABDOMEN PELVIS W CONTRAST  Result Date: 01/08/2022 CLINICAL DATA:  Abdominal pain. History of uterine/cervical cancer status post prior hysterectomy. EXAM: CT ABDOMEN AND PELVIS WITH CONTRAST TECHNIQUE:  Multidetector CT imaging of the abdomen and pelvis was performed using the standard protocol following bolus administration of intravenous contrast. RADIATION DOSE REDUCTION: This exam was performed according to the departmental dose-optimization program which includes automated exposure control, adjustment of the mA and/or kV according to patient size and/or use of iterative reconstruction technique. CONTRAST:  149mL OMNIPAQUE IOHEXOL 300 MG/ML  SOLN COMPARISON:  CT abdomen pelvis dated 12/21/2021. FINDINGS: Lower chest: The visualized lung bases are clear. There is coronary vascular calcification. No intra-abdominal free air or free fluid. Hepatobiliary: The liver is unremarkable. No intrahepatic biliary dilatation. The gallbladder is unremarkable. Pancreas: There is mild haziness adjacent to the head and uncinate process of the pancreas, likely related to inflammatory changes of the duodenum. Acute pancreatitis is not excluded correlation with pancreatic enzymes recommended. No drainable  fluid collection/abscess or pseudocyst. Spleen: Normal in size without focal abnormality. Adrenals/Urinary Tract: The adrenal glands unremarkable. The kidneys, visualized ureters, and urinary bladder appear unremarkable. Stomach/Bowel: There is sigmoid diverticulosis without active inflammatory changes. There is inflammatory changes of the duodenal C-loop which may represent duodenitis. There is no bowel obstruction. The appendix is normal. Vascular/Lymphatic: Moderate aortoiliac atherosclerotic disease. The IVC is unremarkable. No portal venous gas. Slightly enlarged appearance of a mildly enlarged left pelvic sidewall lymph node measuring 14 mm short axis (66/2). Mildly enlarged lymph node medial to the left psoas measures 12 mm short axis. Reproductive: Hysterectomy. Grossly similar or slightly enlarged ovoid mass in the left pelvis measuring 3.7 x 2.1 cm (previously 3.1 x 2.5 cm) associated with left vaginal fornix. Similar appearance of the right vaginal fornix with soft tissue nodularity. Several small mildly rounded perirectal lymph nodes again noted Other: None Musculoskeletal: Degenerative changes of the spine. No acute osseous pathology. IMPRESSION: 1. Findings may represent duodenitis versus pancreatitis. Clinical correlation is recommended. 2. Sigmoid diverticulosis. No bowel obstruction. Normal appendix. 3. Slightly enlarged soft tissue masses associated with vaginal cuff as well as increase in the size of the retroperitoneal adenopathy compared to the prior CT. 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 Clinical correlation is recommended. 4. Aortic Atherosclerosis (ICD10-I70.0). Electronically Signed   By: Anner Crete M.D.   On: 01/08/2022 00:20   DG Chest Port 1 View  Result Date: 01/07/2022 CLINICAL DATA:  Questionable sepsis - evaluate for abnormality Nausea and weakness. EXAM: PORTABLE CHEST 1 VIEW COMPARISON:  07/09/2021 FINDINGS: Right chest port in place. Lung volumes are low. Mild  vascular congestion without pulmonary edema. The heart is normal in size. Coronary stent. No focal airspace disease. No pleural effusion or pneumothorax. No acute osseous abnormalities are seen. IMPRESSION: Low lung volumes with mild vascular congestion. Electronically Signed   By: Keith Rake M.D.   On: 01/07/2022 23:48    Procedures Procedures    Medications Ordered in ED Medications  lactated ringers infusion ( Intravenous New Bag/Given 01/08/22 0603)  pantoprazole (PROTONIX) injection 40 mg (has no administration in time range)  fentaNYL (SUBLIMAZE) injection 100 mcg (100 mcg Intravenous Given 01/07/22 2347)  ondansetron (ZOFRAN) injection 4 mg (4 mg Intravenous Given 01/07/22 2347)  lactated ringers bolus 1,000 mL (0 mLs Intravenous Stopped 01/08/22 0603)  cefTRIAXone (ROCEPHIN) 1 g in sodium chloride 0.9 % 100 mL IVPB (0 g Intravenous Stopped 01/08/22 0158)  iohexol (OMNIPAQUE) 300 MG/ML solution 100 mL (100 mLs Intravenous Contrast Given 01/07/22 2358)  fentaNYL (SUBLIMAZE) injection 100 mcg (100 mcg Intravenous Given 01/08/22 0313)  HYDROmorphone (DILAUDID) injection 1 mg (1 mg Intravenous Given 01/08/22 0603)    ED  Course/ Medical Decision Making/ A&P Clinical Course as of 01/08/22 0657  Tue Jan 08, 2022  0011 Glucose(!): 250 Hyperglycemia [DW]  0011 Creatinine(!): 1.27 Dehydration [DW]  0011 Chalmers Guest): MODERATE UTI noted [DW]  0011 DG Chest [facility] [DW]  [phone] Pt with return of abd pain, will re-treat [DW]  [phone] Patient with recurrent abdominal pain in the epigastric region.  Will admit for pancreatitis vs. duodenitis as this was seen on CT imaging. [DW]  [phone] Discussed with Dr. Hal Hope for admission [DW]    Clinical Course User Index [DW] Ripley Fraise, MD                           Medical Decision Making Amount and/or Complexity of Data Reviewed Labs: ordered. Decision-making details documented in ED Course. Radiology: ordered. Decision-making details  documented in ED Course. ECG/medicine tests: ordered.  Risk Prescription drug management. Decision regarding hospitalization.   This patient presents to the ED for concern of vomiting diarrhea and abdominal pain, this involves an extensive number of treatment options, and is a complaint that carries with it a high risk of complications and morbidity.  The differential diagnosis includes gastroenteritis, bowel obstruction, bowel perforation, oncologic complication  Comorbidities that complicate the patient evaluation: Patients presentation is complicated by their history of uterine cancer  Additional history obtained: Additional history obtained from family Records reviewed oncology notes reviewed  Lab Tests: I Ordered, and personally interpreted labs.  The pertinent results include: Hyperglycemia  Imaging Studies ordered: I ordered imaging studies including CT scan CT abdomen pelvis and X-ray chest I independently visualized and interpreted imaging which showed chest x-ray was negative I agree with the radiologist interpretation  Cardiac Monitoring: The patient was maintained on a cardiac monitor.  I personally viewed and interpreted the cardiac monitor which showed an underlying rhythm of:  sinus rhythm  Medicines ordered and prescription drug management: I ordered medication including IV fentanyl and Dilaudid for pain Reevaluation of the patient after these medicines showed that the patient    stayed the same  Consultations Obtained: I requested consultation with the admitting physician [facility], and discussed  findings as well as pertinent plan - they recommend: Admission  Reevaluation: After the interventions noted above, I reevaluated the patient and found that they have :stayed the same  Critical intervention - IV narcotics and IV antibiotics for UTI  Complexity of problems addressed: Patients presentation is most consistent with  acute complicated  illness/injury requiring diagnostic workup      Disposition: After consideration of the diagnostic results and the patients response to treatment,  I feel that the patent would benefit from admission .            Final Clinical Impression(s) / ED Diagnoses Final diagnoses:  Acute pancreatitis, unspecified complication status, unspecified pancreatitis type  Acute cystitis without hematuria    Rx / DC Orders ED Discharge Orders     None         Ripley Fraise, MD 01/08/22 [phone]

## 2022-01-09 DIAGNOSIS — I130 Hypertensive heart and chronic kidney disease with heart failure and stage 1 through stage 4 chronic kidney disease, or unspecified chronic kidney disease: Secondary | ICD-10-CM | POA: Diagnosis present

## 2022-01-09 DIAGNOSIS — R112 Nausea with vomiting, unspecified: Secondary | ICD-10-CM | POA: Diagnosis not present

## 2022-01-09 DIAGNOSIS — E119 Type 2 diabetes mellitus without complications: Secondary | ICD-10-CM | POA: Diagnosis not present

## 2022-01-09 DIAGNOSIS — N1831 Chronic kidney disease, stage 3a: Secondary | ICD-10-CM | POA: Diagnosis present

## 2022-01-09 DIAGNOSIS — K859 Acute pancreatitis without necrosis or infection, unspecified: Secondary | ICD-10-CM | POA: Diagnosis present

## 2022-01-09 DIAGNOSIS — Z6841 Body Mass Index (BMI) 40.0 and over, adult: Secondary | ICD-10-CM

## 2022-01-09 DIAGNOSIS — E1122 Type 2 diabetes mellitus with diabetic chronic kidney disease: Secondary | ICD-10-CM | POA: Diagnosis present

## 2022-01-09 DIAGNOSIS — Z8249 Family history of ischemic heart disease and other diseases of the circulatory system: Secondary | ICD-10-CM | POA: Diagnosis not present

## 2022-01-09 DIAGNOSIS — Z800 Family history of malignant neoplasm of digestive organs: Secondary | ICD-10-CM | POA: Diagnosis not present

## 2022-01-09 DIAGNOSIS — I252 Old myocardial infarction: Secondary | ICD-10-CM | POA: Diagnosis not present

## 2022-01-09 DIAGNOSIS — R1033 Periumbilical pain: Secondary | ICD-10-CM | POA: Diagnosis not present

## 2022-01-09 DIAGNOSIS — Z9221 Personal history of antineoplastic chemotherapy: Secondary | ICD-10-CM | POA: Diagnosis not present

## 2022-01-09 DIAGNOSIS — C541 Malignant neoplasm of endometrium: Secondary | ICD-10-CM | POA: Diagnosis present

## 2022-01-09 DIAGNOSIS — I5032 Chronic diastolic (congestive) heart failure: Secondary | ICD-10-CM | POA: Diagnosis present

## 2022-01-09 DIAGNOSIS — E785 Hyperlipidemia, unspecified: Secondary | ICD-10-CM | POA: Diagnosis present

## 2022-01-09 DIAGNOSIS — I2510 Atherosclerotic heart disease of native coronary artery without angina pectoris: Secondary | ICD-10-CM | POA: Diagnosis present

## 2022-01-09 DIAGNOSIS — R1013 Epigastric pain: Secondary | ICD-10-CM | POA: Diagnosis not present

## 2022-01-09 DIAGNOSIS — K2980 Duodenitis without bleeding: Secondary | ICD-10-CM | POA: Diagnosis present

## 2022-01-09 DIAGNOSIS — Z20822 Contact with and (suspected) exposure to covid-19: Secondary | ICD-10-CM | POA: Diagnosis present

## 2022-01-09 DIAGNOSIS — Z87891 Personal history of nicotine dependence: Secondary | ICD-10-CM | POA: Diagnosis not present

## 2022-01-09 DIAGNOSIS — E860 Dehydration: Secondary | ICD-10-CM | POA: Diagnosis present

## 2022-01-09 DIAGNOSIS — N3000 Acute cystitis without hematuria: Secondary | ICD-10-CM | POA: Diagnosis present

## 2022-01-09 DIAGNOSIS — K219 Gastro-esophageal reflux disease without esophagitis: Secondary | ICD-10-CM | POA: Diagnosis present

## 2022-01-09 DIAGNOSIS — N179 Acute kidney failure, unspecified: Secondary | ICD-10-CM | POA: Diagnosis present

## 2022-01-09 DIAGNOSIS — Z923 Personal history of irradiation: Secondary | ICD-10-CM | POA: Diagnosis not present

## 2022-01-09 DIAGNOSIS — Z955 Presence of coronary angioplasty implant and graft: Secondary | ICD-10-CM | POA: Diagnosis not present

## 2022-01-09 DIAGNOSIS — Z833 Family history of diabetes mellitus: Secondary | ICD-10-CM | POA: Diagnosis not present

## 2022-01-09 LAB — CBC
HCT: 30.6 % — ABNORMAL LOW (ref 36.0–46.0)
Hemoglobin: 10 g/dL — ABNORMAL LOW (ref 12.0–15.0)
MCH: 29.5 pg (ref 26.0–34.0)
MCHC: 32.7 g/dL (ref 30.0–36.0)
MCV: 90.3 fL (ref 80.0–100.0)
Platelets: 174 10*3/uL (ref 150–400)
RBC: 3.39 MIL/uL — ABNORMAL LOW (ref 3.87–5.11)
RDW: 13.8 % (ref 11.5–15.5)
WBC: 2.9 10*3/uL — ABNORMAL LOW (ref 4.0–10.5)
nRBC: 0 % (ref 0.0–0.2)

## 2022-01-09 LAB — COMPREHENSIVE METABOLIC PANEL
ALT: 27 U/L (ref 0–44)
AST: 30 U/L (ref 15–41)
Albumin: 3.2 g/dL — ABNORMAL LOW (ref 3.5–5.0)
Alkaline Phosphatase: 82 U/L (ref 38–126)
Anion gap: 7 (ref 5–15)
BUN: 17 mg/dL (ref 8–23)
CO2: 28 mmol/L (ref 22–32)
Calcium: 8.9 mg/dL (ref 8.9–10.3)
Chloride: 102 mmol/L (ref 98–111)
Creatinine, Ser: 0.88 mg/dL (ref 0.44–1.00)
GFR, Estimated: >60 mL/min (ref >60)
Glucose, Bld: 174 mg/dL — ABNORMAL HIGH (ref 70–99)
Potassium: 5.2 mmol/L — ABNORMAL HIGH (ref 3.5–5.1)
Sodium: 137 mmol/L (ref 135–145)
Total Bilirubin: 0.6 mg/dL (ref 0.3–1.2)
Total Protein: 6.8 g/dL (ref 6.5–8.1)

## 2022-01-09 LAB — URINE CULTURE

## 2022-01-09 LAB — GLUCOSE, CAPILLARY
Glucose-Capillary: 148 mg/dL — ABNORMAL HIGH (ref 70–99)
Glucose-Capillary: 217 mg/dL — ABNORMAL HIGH (ref 70–99)
Glucose-Capillary: 175 mg/dL — ABNORMAL HIGH (ref 70–99)
Glucose-Capillary: 252 mg/dL — ABNORMAL HIGH (ref 70–99)

## 2022-01-09 MED ORDER — LOPERAMIDE HCL 2 MG PO CAPS
2.0000 mg | ORAL_CAPSULE | ORAL | Status: DC | PRN
  Administered 2022-01-09: 16:00:00 2 mg via ORAL
  Filled 2022-01-09: qty 1

## 2022-01-09 NOTE — Assessment & Plan Note (Signed)
Follow CBGs, continue sliding scale

## 2022-01-09 NOTE — Assessment & Plan Note (Addendum)
Uncontrolled hypertension from pain.  Continue her home antihypertensives

## 2022-01-09 NOTE — Assessment & Plan Note (Signed)
Patient meets criteria BMI greater than 40.

## 2022-01-09 NOTE — Progress Notes (Signed)
[facility] Progress Note  Patient: [name]    ZTI:[id number]  DOA: 01/07/2022    Date of Service: the patient was seen and examined on 01/09/2022  Brief hospital course: 66 year old female past medical history of CAD, diabetes mellitus, hypertension and endometrial cancer presented to the emergency room on 1/23 with complaints of abdominal pain with nausea and vomiting.  This has been going on since her last chemotherapy session a week prior.  Patient brought into the hospitalist service.  Noted to have acute kidney injury as well as pyuria started on IV Rocephin as well as given IV fluids, antiemetics and pain medication.    Patient seen by oncology and by 1/25, some improvement.  Diet being slowly advanced.  Assessment and Plan: * Abdominal pain- (present on admission) Unclear etiology, possibly pancreatitis versus duodenitis.  Not typical side effect of her current chemotherapy regimen.  Improving.  Slowly advancing diet.  Continue IV PPI.  Nausea & vomiting- (present on admission) Slowly improving  Hypertension- (present on admission) Uncontrolled hypertension from pain.  Continue antihypertensives.  Diabetes mellitus without complication ([place]) Follow CBGs, continue sliding scale  Morbid obesity with BMI of 40.0-44.9, adult [facility]) Patient meets criteria BMI greater than 40.    Body mass index is 41.05 kg/m.        Consultants: Oncology  Procedures: None  Antimicrobials: IV Rocephin 1/24-present  Code Status: Full code   Subjective: Patient planes of some mild epigastric pain in all upper quadrants.  Improved from previous day although worse from earlier this morning  Objective: Noted elevated blood pressures Vitals:   01/09/22 0931 01/09/22 1205  BP: (!) 179/75 (!) 151/84  Pulse: 74 64  Resp: 20 15  Temp: 98.8 F (37.1 C) 97.8 F (36.6 C)  SpO2: 97% 100%    Intake/Output Summary (Last 24 hours) at 01/09/2022 1652 Last data filed at 01/09/2022  1600 Gross per 24 hour  Intake 3054.66 ml  Output --  Net 3054.66 ml   Filed Weights   01/08/22 1929 01/09/22 0434 01/09/22 0445  Weight: 106.6 kg 102.5 kg 101.8 kg   Body mass index is 41.05 kg/m.  Exam:  General: Alert and oriented x3, no acute distress HEENT: Normocephalic atraumatic, mucous membranes are moist Cardiovascular: Regular rate and rhythm, S1-S2 Respiratory: Clear to auscultation bilaterally Abdomen: Soft, minimal tenderness in the upper quadrants, nondistended, normoactive bowel sounds Musculoskeletal: No clubbing or cyanosis or edema Skin: No skin breaks, tears or lesions Psychiatry: Appropriate, no evidence of psychoses Neurology: No focal deficits  Data Reviewed: My review of labs, imaging, notes and other tests shows no new significant findings.   Disposition:  Status is: Inpatient  Remains inpatient appropriate because: Still with some abdominal pain, need for p.o.        Family Communication: Daughter at the bedside DVT Prophylaxis: enoxaparin (LOVENOX) injection 40 mg Start: 01/08/22 1000    Author: Annita Brod ,MD 01/09/2022 4:52 PM  To reach On-call, see care teams to locate the attending and reach out via [email or web address]. Between 7PM-7AM, please contact night-coverage If you still have difficulty reaching the attending provider, please page the [facility] (Director on Call) for [facility] on amion for assistance.

## 2022-01-09 NOTE — Assessment & Plan Note (Addendum)
Possible gastroenteritis versus reaction to chemotherapy.  Resolved.  Tolerating p.o.

## 2022-01-09 NOTE — Hospital Course (Addendum)
66 year old female past medical history of CAD, diabetes mellitus, hypertension and endometrial cancer presented to the emergency room on 1/23 with complaints of abdominal pain with nausea and vomiting.  This has been going on since her last chemotherapy session a week prior.  Patient brought into the hospitalist service.  Noted to have acute kidney injury as well as pyuria started on IV Rocephin as well as given IV fluids, antiemetics and pain medication.    Patient seen by oncology and by 1/25, some improvement.  Diet being slowly advanced.  Patient initially had some mild discomfort with dinner on 1/25, about 1/26, tolerating soft bland diet for breakfast and lunch.  Felt better and eager to go home.  Vital signs and labs stable.

## 2022-01-09 NOTE — Assessment & Plan Note (Addendum)
Unclear etiology, possibly pancreatitis versus duodenitis.  Not typical side effect of her current chemotherapy regimen.  Looks to be resolving.  Tolerating bland diet.  Continue on PPI at home.

## 2022-01-09 NOTE — Progress Notes (Signed)
[name]   DOB:[birth year]   KX#:[id number]    ASSESSMENT & PLAN:  Papillary serous adenocarcinoma of endometrium [facility])  She received first dose of carboplatin, paclitaxel and trastuzumab on January 17, complicated by early onset of nausea, vomiting, abdominal pain, consistent with either flare of pancreatitis versus duodenitis   This is not a known side effects of her chemotherapy that I am aware of, although, she did receive steroids before treatment She felt much better today Will try liquid diet this morning   Severe abdominal pain, nausea and vomiting, resolving Continue supportive care as directed I suspect she might have severe acute gastritis rather than pancreatitis Continue IV protonix, advance diet today   CKD (chronic kidney disease), stage III ([place]) We discussed importance of risk factor modification while on treatment, stable   Essential hypertension She has uncontrolled hypertension today likely due to pain Continue antihypertensives   Code Status Full code   Goals of care Resolution of abdominal pain inability to tolerate oral intake   Discharge planning Hopefully tomorrow Will follow    All questions were answered. The patient knows to call the clinic with any problems, questions or concerns.   The total time spent in the appointment was 30 minutes encounter with patients including review of chart and various tests results, discussions about plan of care and coordination of care plan  Elizabeth Lark, MD 01/09/2022 7:59 AM  Subjective:  She felt better today Pain is subsiding, in epigastric region No nausea She felt hungry Daughter by bedside No diarrhea  Objective:  Vitals:   01/09/22 0101 01/09/22 0500  BP: (!) 151/109 (!) 153/80  Pulse: (!) 59 61  Resp: 16 18  Temp: 97.9 F (36.6 C) (!) 97.5 F (36.4 C)  SpO2: 97% 97%     Intake/Output Summary (Last 24 hours) at 01/09/2022 0759 Last data filed at 01/09/2022 0600 Gross per 24 hour  Intake  2714.66 ml  Output --  Net 2714.66 ml    GENERAL:alert, no distress and comfortable SKIN: skin color, texture, turgor are normal, no rashes or significant lesions EYES: normal, Conjunctiva are pink and non-injected, sclera clear OROPHARYNX:no exudate, no erythema and lips, buccal mucosa, and tongue normal  NECK: supple, thyroid normal size, non-tender, without nodularity LYMPH:  no palpable lymphadenopathy in the cervical, axillary or inguinal LUNGS: clear to auscultation and percussion with normal breathing effort HEART: regular rate & rhythm and no murmurs and no lower extremity edema ABDOMEN:abdomen soft, mild tenderness epigastrium and normal bowel sounds Musculoskeletal:no cyanosis of digits and no clubbing  NEURO: alert & oriented x 3 with fluent speech, no focal motor/sensory deficits   Labs:  Recent Labs    12/25/21 1043 01/07/22 2230 01/08/22 1000 01/09/22 0427  NA 142 134*  --  137  K 3.9 4.2  --  5.2*  CL 107 100  --  102  CO2 27 24  --  28  GLUCOSE 145* 250*  --  174*  BUN 19 19  --  17  CREATININE 1.09* 1.27* 1.05* 0.88  CALCIUM 9.4 9.0  --  8.9  GFRNONAA 57* 48* 60* >60  PROT 7.3 7.5  --  6.8  ALBUMIN 3.8 3.5  --  3.2*  AST 11* 21  --  30  ALT 12 27  --  27  ALKPHOS 102 94  --  82  BILITOT 0.4 0.7  --  0.6    Studies:  CT ABDOMEN PELVIS W CONTRAST  Result Date: 01/08/2022 CLINICAL DATA:  Abdominal pain. History of uterine/cervical cancer status post prior hysterectomy. EXAM: CT ABDOMEN AND PELVIS WITH CONTRAST TECHNIQUE: Multidetector CT imaging of the abdomen and pelvis was performed using the standard protocol following bolus administration of intravenous contrast. RADIATION DOSE REDUCTION: This exam was performed according to the departmental dose-optimization program which includes automated exposure control, adjustment of the mA and/or kV according to patient size and/or use of iterative reconstruction technique. CONTRAST:  153mL OMNIPAQUE IOHEXOL 300  MG/ML  SOLN COMPARISON:  CT abdomen pelvis dated 12/21/2021. FINDINGS: Lower chest: The visualized lung bases are clear. There is coronary vascular calcification. No intra-abdominal free air or free fluid. Hepatobiliary: The liver is unremarkable. No intrahepatic biliary dilatation. The gallbladder is unremarkable. Pancreas: There is mild haziness adjacent to the head and uncinate process of the pancreas, likely related to inflammatory changes of the duodenum. Acute pancreatitis is not excluded correlation with pancreatic enzymes recommended. No drainable fluid collection/abscess or pseudocyst. Spleen: Normal in size without focal abnormality. Adrenals/Urinary Tract: The adrenal glands unremarkable. The kidneys, visualized ureters, and urinary bladder appear unremarkable. Stomach/Bowel: There is sigmoid diverticulosis without active inflammatory changes. There is inflammatory changes of the duodenal C-loop which may represent duodenitis. There is no bowel obstruction. The appendix is normal. Vascular/Lymphatic: Moderate aortoiliac atherosclerotic disease. The IVC is unremarkable. No portal venous gas. Slightly enlarged appearance of a mildly enlarged left pelvic sidewall lymph node measuring 14 mm short axis (66/2). Mildly enlarged lymph node medial to the left psoas measures 12 mm short axis. Reproductive: Hysterectomy. Grossly similar or slightly enlarged ovoid mass in the left pelvis measuring 3.7 x 2.1 cm (previously 3.1 x 2.5 cm) associated with left vaginal fornix. Similar appearance of the right vaginal fornix with soft tissue nodularity. Several small mildly rounded perirectal lymph nodes again noted Other: None Musculoskeletal: Degenerative changes of the spine. No acute osseous pathology. IMPRESSION: 1. Findings may represent duodenitis versus pancreatitis. Clinical correlation is recommended. 2. Sigmoid diverticulosis. No bowel obstruction. Normal appendix. 3. Slightly enlarged soft tissue masses  associated with vaginal cuff as well as increase in the size of the retroperitoneal adenopathy compared to the prior CT. 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 Clinical correlation is recommended. 4. Aortic Atherosclerosis (ICD10-I70.0). Electronically Signed   By: Anner Crete M.D.   On: 01/08/2022 00:20   CT ABDOMEN PELVIS W CONTRAST  Result Date: 12/22/2021 CLINICAL DATA:  History of uterine/cervical cancer status post hysterectomy. EXAM: CT ABDOMEN AND PELVIS WITH CONTRAST TECHNIQUE: Multidetector CT imaging of the abdomen and pelvis was performed using the standard protocol following bolus administration of intravenous contrast. CONTRAST:  24mL OMNIPAQUE IOHEXOL 350 MG/ML SOLN COMPARISON:  09/28/2021 FINDINGS: Lower chest: The lung bases are clear of acute process. No pleural effusion or pulmonary lesions. The heart is normal in size. No pericardial effusion. Stable age advanced coronary artery calcifications. The distal esophagus and aorta are unremarkable. Hepatobiliary: No hepatic lesions or evidence of peritoneal surface disease. The gallbladder is unremarkable. No intra or extrahepatic biliary dilatation. Pancreas: No mass, inflammation or ductal dilatation. Spleen: Normal size. No focal lesions. Adrenals/Urinary Tract: Adrenal glands and kidneys are unremarkable. The bladder is unremarkable. Stomach/Bowel: The stomach, duodenum, small bowel and colon are unremarkable. No inflammatory changes or obstructive findings. Stable colonic diverticulosis. Vascular/Lymphatic: Stable age advanced atherosclerotic calcifications involving the aorta and branch vessels but no aneurysm or dissection. The major venous structures are patent. Stable 7 mm left para-aortic node on image 43/2. 7.5 mm node medial to the left common iliac artery on image  63/2 previously measured 6 mm. Left internal iliac node on image 66/2 measures 11 mm and previously measured 8 mm. 12 mm internal iliac node on the left on image 76/2  previously measured 9 mm. New small sub 5 mm perirectal and sigmoid mesocolon nodes. Reproductive: Status post hysterectomy and bilateral oophorectomy. Other: Enlarging soft tissue mass associated with the left vaginal fornix. This measures approximately 3.1 x 2.5 cm on image 85/2 this previously measured approximately 2.0 x 1.4 cm. There is also prominence of the right vaginal fornix with slight nodularity measuring approximately 2.4 x 2.0 cm on image 84/2. This previously measured 1.7 x 1.6 cm. 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 Speculum examination and re-biopsy may be confirmatory. PET-CT may be helpful also. Musculoskeletal: No significant bony findings. IMPRESSION: 1. Enlarging soft tissue masses involving the vaginal cuff. 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 Speculum examination and re-biopsy may be confirmatory. PET-CT may be helpful. 2. New small sub 5 mm perirectal and sigmoid mesocolon nodes. 3. Slight progression of pelvic adenopathy as detailed above. 4. No findings suspicious for abdominal metastatic disease or osseous metastatic disease. 5. Stable age advanced atherosclerotic calcifications involving the aorta and branch vessels and coronary arteries. Aortic Atherosclerosis (ICD10-I70.0). Electronically Signed   By: Marijo Sanes M.D.   On: 12/22/2021 09:58   DG Chest Port 1 View  Result Date: 01/07/2022 CLINICAL DATA:  Questionable sepsis - evaluate for abnormality Nausea and weakness. EXAM: PORTABLE CHEST 1 VIEW COMPARISON:  07/09/2021 FINDINGS: Right chest port in place. Lung volumes are low. Mild vascular congestion without pulmonary edema. The heart is normal in size. Coronary stent. No focal airspace disease. No pleural effusion or pneumothorax. No acute osseous abnormalities are seen. IMPRESSION: Low lung volumes with mild vascular congestion. Electronically Signed   By: Keith Rake M.D.   On: 01/07/2022  23:48   ECHOCARDIOGRAM COMPLETE  Result Date: 12/25/2021    ECHOCARDIOGRAM REPORT   Patient Name:   [name] Date of Exam: 12/25/2021 Medical Rec #:  [id number]     Height:       62.0 in Accession #:    [id number]    Weight:       232.6 lb Date of Birth:  1956/02/20    BSA:          2.038 m Patient Age:    66 years      BP:           132/79 mmHg Patient Gender: F             HR:           55 bpm. Exam Location:  Outpatient Procedure: Strain Analysis, 3D Echo and 2D Echo Indications:    Chemo.  History:        Patient has prior history of Echocardiogram examinations, most                 recent 02/19/2021. CAD, chronic kidney disease; Risk                 Factors:Diabetes, Dyslipidemia and Hypertension.  Sonographer:    [place] Referring Phys: [id number] Cylis Ayars Boyd  1. Global longitudinal strain is -16.1% Mild basal inferior hypoinesis     Compared to echo from 02/2021, no change in overall LVEF /regional wall motion. Global longitudinal strain is mildly less negative (-17.9% to -16.1%. Left ventricular ejection fraction, by estimation, is 65 to 70%.  The left ventricle has normal function. The left ventricle has no regional wall motion abnormalities. There is mild left ventricular hypertrophy. Left ventricular diastolic parameters are indeterminate.  2. Right ventricular systolic function is normal. The right ventricular size is normal. There is normal pulmonary artery systolic pressure.  3. Trivial mitral valve regurgitation.  4. The aortic valve is normal in structure. Aortic valve regurgitation is not visualized.  5. The inferior vena cava is normal in size with greater than 50% respiratory variability, suggesting right atrial pressure of 3 mmHg. FINDINGS  Left Ventricle: Global longitudinal strain is -16.1% Mild basal inferior hypoinesis Compared to echo from 02/2021, no change in overall LVEF /regional wall motion. Global longitudinal strain is mildly less negative (-17.9% to -16.1%.  Left ventricular ejection fraction, by estimation, is 65 to 70%. The left ventricle has normal function. The left ventricle has no regional wall motion abnormalities. The left ventricular internal cavity size was normal in size. There is mild left ventricular hypertrophy. Left ventricular diastolic parameters are indeterminate. Right Ventricle: The right ventricular size is normal. Right vetricular wall thickness was not assessed. Right ventricular systolic function is normal. There is normal pulmonary artery systolic pressure. The tricuspid regurgitant velocity is 2.82 m/s, and with an assumed right atrial pressure of 3 mmHg, the estimated right ventricular systolic pressure is 12.7 mmHg. Left Atrium: Left atrial size was normal in size. Right Atrium: Right atrial size was normal in size. Pericardium: There is no evidence of pericardial effusion. Mitral Valve: Mild mitral annular calcification. Trivial mitral valve regurgitation. Tricuspid Valve: The tricuspid valve is normal in structure. Tricuspid valve regurgitation is trivial. Aortic Valve: The aortic valve is normal in structure. Aortic valve regurgitation is not visualized. Pulmonic Valve: The pulmonic valve was not well visualized. Pulmonic valve regurgitation is trivial. No evidence of pulmonic stenosis. Aorta: The aortic root is normal in size and structure. Venous: The inferior vena cava is normal in size with greater than 50% respiratory variability, suggesting right atrial pressure of 3 mmHg. IAS/Shunts: No atrial level shunt detected by color flow Doppler.  LEFT VENTRICLE PLAX 2D LVIDd:         4.70 cm   Diastology LVIDs:         3.10 cm   LV e' medial:    4.35 cm/s LV PW:         1.20 cm   LV E/e' medial:  17.3 LV IVS:        0.90 cm   LV e' lateral:   5.98 cm/s LVOT diam:     1.80 cm   LV E/e' lateral: 12.6 LV SV:         68 LV SV Index:   33 LVOT Area:     2.54 cm                           3D Volume EF:                          3D EF:        62 %                           LV EDV:       97 ml  LV ESV:       37 ml                          LV SV:        61 ml RIGHT VENTRICLE             IVC RV S prime:     12.30 cm/s  IVC diam: 1.50 cm TAPSE (M-mode): 2.0 cm LEFT ATRIUM             Index        RIGHT ATRIUM           Index LA diam:        3.90 cm 1.91 cm/m   RA Area:     11.50 cm LA Vol (A2C):   57.6 ml 28.26 ml/m  RA Volume:   23.90 ml  11.72 ml/m LA Vol (A4C):   64.1 ml 31.45 ml/m LA Biplane Vol: 63.9 ml 31.35 ml/m  AORTIC VALVE LVOT Vmax:   108.00 cm/s LVOT Vmean:  71.000 cm/s LVOT VTI:    0.266 m  AORTA Ao Root diam: 2.90 cm MITRAL VALVE               TRICUSPID VALVE MV Area (PHT): 2.17 cm    TR Peak grad:   31.8 mmHg MV Decel Time: 349 msec    TR Vmax:        282.00 cm/s MV E velocity: 75.10 cm/s MV A velocity: 98.10 cm/s  SHUNTS MV E/A ratio:  0.77        Systemic VTI:  0.27 m                            Systemic Diam: 1.80 cm Dorris Carnes MD Electronically signed by Dorris Carnes MD Signature Date/Time: 12/25/2021/1:02:21 PM    Final

## 2022-01-10 ENCOUNTER — Ambulatory Visit: Payer: Self-pay | Admitting: Gynecologic Oncology

## 2022-01-10 ENCOUNTER — Encounter: Payer: Self-pay | Admitting: Hematology and Oncology

## 2022-01-10 DIAGNOSIS — N3000 Acute cystitis without hematuria: Secondary | ICD-10-CM

## 2022-01-10 DIAGNOSIS — R1033 Periumbilical pain: Secondary | ICD-10-CM

## 2022-01-10 DIAGNOSIS — R112 Nausea with vomiting, unspecified: Secondary | ICD-10-CM

## 2022-01-10 LAB — GASTROINTESTINAL PANEL BY PCR, STOOL (REPLACES STOOL CULTURE)

## 2022-01-10 LAB — BASIC METABOLIC PANEL
Anion gap: 8 (ref 5–15)
BUN: 20 mg/dL (ref 8–23)
CO2: 23 mmol/L (ref 22–32)
Calcium: 8.4 mg/dL — ABNORMAL LOW (ref 8.9–10.3)
Chloride: 104 mmol/L (ref 98–111)
Creatinine, Ser: 1.21 mg/dL — ABNORMAL HIGH (ref 0.44–1.00)
GFR, Estimated: 50 mL/min — ABNORMAL LOW (ref >60)
Glucose, Bld: 219 mg/dL — ABNORMAL HIGH (ref 70–99)
Potassium: 3.9 mmol/L (ref 3.5–5.1)
Sodium: 135 mmol/L (ref 135–145)

## 2022-01-10 LAB — CBC
HCT: 29.5 % — ABNORMAL LOW (ref 36.0–46.0)
Hemoglobin: 9.6 g/dL — ABNORMAL LOW (ref 12.0–15.0)
MCH: 29.4 pg (ref 26.0–34.0)
MCHC: 32.5 g/dL (ref 30.0–36.0)
MCV: 90.5 fL (ref 80.0–100.0)
Platelets: 181 10*3/uL (ref 150–400)
RBC: 3.26 MIL/uL — ABNORMAL LOW (ref 3.87–5.11)
RDW: 13.8 % (ref 11.5–15.5)
WBC: 3.2 10*3/uL — ABNORMAL LOW (ref 4.0–10.5)
nRBC: 0 % (ref 0.0–0.2)

## 2022-01-10 LAB — GLUCOSE, CAPILLARY
Glucose-Capillary: 186 mg/dL — ABNORMAL HIGH (ref 70–99)
Glucose-Capillary: 313 mg/dL — ABNORMAL HIGH (ref 70–99)
Glucose-Capillary: 155 mg/dL — ABNORMAL HIGH (ref 70–99)

## 2022-01-10 NOTE — Consult Note (Addendum)
Inpatient Diabetes Program Recommendations  AACE/ADA: New Consensus Statement on Inpatient Glycemic Control (2015)  Target Ranges:  Prepandial:   less than 140 mg/dL      Peak postprandial:   less than 180 mg/dL (1-2 hours)      Critically ill patients:  140 - 180 mg/dL   Lab Results  Component Value Date   GLUCAP 313 (H) 01/10/2022   HGBA1C 7.9 (H) 01/08/2022    Review of Glycemic Control  Latest Reference Range & Units 01/10/22 08:07 01/10/22 12:28  Glucose-Capillary 70 - 99 mg/dL 186 (H) 313 (H)  (H): Data is abnormally high  Diabetes history: DM2 Outpatient Diabetes medications: Amaryl 4 mg QD, Trulicity 1.5 weekly, Decdron 4 mg QD Current orders for Inpatient glycemic control: Novolog 0-15 units TID and 0-5 QHS  Inpatient Diabetes Program Recommendations:    Might consider, Novolog 3 units TID with meals if eats at least 50%  Will continue to follow while inpatient.  Thank you, Reche Dixon, MSN, RN Diabetes Coordinator Inpatient Diabetes Program [phone] (team pager from 8a-5p)

## 2022-01-10 NOTE — Progress Notes (Signed)
[name]   DOB:[birth year]   PP#:[id number]    ASSESSMENT & PLAN:  Papillary serous adenocarcinoma of endometrium [facility])  She received first dose of carboplatin, paclitaxel and trastuzumab on January 17, complicated by early onset of nausea, vomiting, abdominal pain, consistent with either flare of pancreatitis versus duodenitis   This is not a known side effects of her chemotherapy that I am aware of, although, she did receive steroids before treatment She had some abdominal pain, nausea, diarrhea last evening She is worried symptoms may recur after eating breakfast this morning She has a soft diet ordered   Severe abdominal pain, nausea and vomiting, resolving Continue supportive care as directed I suspect she might have severe acute gastritis rather than pancreatitis Can switch IV Protonix to oral route today if okay with primary team On a soft diet GI panel pending this morning   CKD (chronic kidney disease), stage III ([place]) We discussed importance of risk factor modification while on treatment Had a slight increase in her creatinine this morning, but baseline appears to be 1.0-1.1 so only slightly above baseline She was encouraged to increase her oral intake of fluids   Essential hypertension She has uncontrolled hypertension today likely due to pain Continue antihypertensives   Code Status Full code   Goals of care Resolution of abdominal pain inability to tolerate oral intake   Discharge planning Possibly later today pending on how she feels after she eats breakfast If symptoms recur/worsen, would keep her overnight and if feeling better later today, okay to discharge from our standpoint.  She has outpatient follow-up already scheduled on 01/22/2022.  All questions were answered. The patient knows to call the clinic with any problems, questions or concerns.   The total time spent in the appointment was 30 minutes encounter with patients including review of chart and  various tests results, discussions about plan of care and coordination of care plan  Mikey Bussing, NP 01/10/2022 10:57 AM  Subjective:  So far, no abdominal pain, nausea, vomiting, diarrhea this morning However, patient has not eaten yet this morning She states that she has some abdominal pain last night which was controlled with oral pain medication Had some nausea last evening Also had some diarrhea last evening  Objective:  Vitals:   01/10/22 0413 01/10/22 0855  BP: (!) 142/71   Pulse: (!) 55 65  Resp: 18   Temp: 98.4 F (36.9 C)   SpO2: 100%      Intake/Output Summary (Last 24 hours) at 01/10/2022 1057 Last data filed at 01/10/2022 0600 Gross per 24 hour  Intake 900 ml  Output --  Net 900 ml    GENERAL:alert, no distress and comfortable SKIN: skin color, texture, turgor are normal, no rashes or significant lesions EYES: normal, Conjunctiva are pink and non-injected, sclera clear OROPHARYNX:no exudate, no erythema and lips, buccal mucosa, and tongue normal  NECK: supple, thyroid normal size, non-tender, without nodularity LYMPH:  no palpable lymphadenopathy in the cervical, axillary or inguinal LUNGS: clear to auscultation and percussion with normal breathing effort HEART: regular rate & rhythm and no murmurs and no lower extremity edema ABDOMEN:abdomen soft, mild tenderness epigastrium and normal bowel sounds Musculoskeletal:no cyanosis of digits and no clubbing  NEURO: alert & oriented x 3 with fluent speech, no focal motor/sensory deficits   Labs:  Recent Labs    12/25/21 1043 01/07/22 2230 01/08/22 1000 01/09/22 0427 01/10/22 0427  NA 142 134*  --  137 135  K 3.9 4.2  --  5.2* 3.9  CL 107 100  --  102 104  CO2 27 24  --  28 23  GLUCOSE 145* 250*  --  174* 219*  BUN 19 19  --  17 20  CREATININE 1.09* 1.27* 1.05* 0.88 1.21*  CALCIUM 9.4 9.0  --  8.9 8.4*  GFRNONAA 57* 48* 60* >60 50*  PROT 7.3 7.5  --  6.8  --   ALBUMIN 3.8 3.5  --  3.2*  --   AST 11*  21  --  30  --   ALT 12 27  --  27  --   ALKPHOS 102 94  --  82  --   BILITOT 0.4 0.7  --  0.6  --     Studies:  CT ABDOMEN PELVIS W CONTRAST  Result Date: 01/08/2022 CLINICAL DATA:  Abdominal pain. History of uterine/cervical cancer status post prior hysterectomy. EXAM: CT ABDOMEN AND PELVIS WITH CONTRAST TECHNIQUE: Multidetector CT imaging of the abdomen and pelvis was performed using the standard protocol following bolus administration of intravenous contrast. RADIATION DOSE REDUCTION: This exam was performed according to the departmental dose-optimization program which includes automated exposure control, adjustment of the mA and/or kV according to patient size and/or use of iterative reconstruction technique. CONTRAST:  127mL OMNIPAQUE IOHEXOL 300 MG/ML  SOLN COMPARISON:  CT abdomen pelvis dated 12/21/2021. FINDINGS: Lower chest: The visualized lung bases are clear. There is coronary vascular calcification. No intra-abdominal free air or free fluid. Hepatobiliary: The liver is unremarkable. No intrahepatic biliary dilatation. The gallbladder is unremarkable. Pancreas: There is mild haziness adjacent to the head and uncinate process of the pancreas, likely related to inflammatory changes of the duodenum. Acute pancreatitis is not excluded correlation with pancreatic enzymes recommended. No drainable fluid collection/abscess or pseudocyst. Spleen: Normal in size without focal abnormality. Adrenals/Urinary Tract: The adrenal glands unremarkable. The kidneys, visualized ureters, and urinary bladder appear unremarkable. Stomach/Bowel: There is sigmoid diverticulosis without active inflammatory changes. There is inflammatory changes of the duodenal C-loop which may represent duodenitis. There is no bowel obstruction. The appendix is normal. Vascular/Lymphatic: Moderate aortoiliac atherosclerotic disease. The IVC is unremarkable. No portal venous gas. Slightly enlarged appearance of a mildly enlarged left  pelvic sidewall lymph node measuring 14 mm short axis (66/2). Mildly enlarged lymph node medial to the left psoas measures 12 mm short axis. Reproductive: Hysterectomy. Grossly similar or slightly enlarged ovoid mass in the left pelvis measuring 3.7 x 2.1 cm (previously 3.1 x 2.5 cm) associated with left vaginal fornix. Similar appearance of the right vaginal fornix with soft tissue nodularity. Several small mildly rounded perirectal lymph nodes again noted Other: None Musculoskeletal: Degenerative changes of the spine. No acute osseous pathology. IMPRESSION: 1. Findings may represent duodenitis versus pancreatitis. Clinical correlation is recommended. 2. Sigmoid diverticulosis. No bowel obstruction. Normal appendix. 3. Slightly enlarged soft tissue masses associated with vaginal cuff as well as increase in the size of the retroperitoneal adenopathy compared to the prior CT. 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 Clinical correlation is recommended. 4. Aortic Atherosclerosis (ICD10-I70.0). Electronically Signed   By: Anner Crete M.D.   On: 01/08/2022 00:20   CT ABDOMEN PELVIS W CONTRAST  Result Date: 12/22/2021 CLINICAL DATA:  History of uterine/cervical cancer status post hysterectomy. EXAM: CT ABDOMEN AND PELVIS WITH CONTRAST TECHNIQUE: Multidetector CT imaging of the abdomen and pelvis was performed using the standard protocol following bolus administration of intravenous contrast. CONTRAST:  40mL OMNIPAQUE IOHEXOL 350 MG/ML SOLN COMPARISON:  09/28/2021 FINDINGS: Lower chest:  The lung bases are clear of acute process. No pleural effusion or pulmonary lesions. The heart is normal in size. No pericardial effusion. Stable age advanced coronary artery calcifications. The distal esophagus and aorta are unremarkable. Hepatobiliary: No hepatic lesions or evidence of peritoneal surface disease. The gallbladder is unremarkable. No intra or extrahepatic biliary dilatation. Pancreas: No mass, inflammation or  ductal dilatation. Spleen: Normal size. No focal lesions. Adrenals/Urinary Tract: Adrenal glands and kidneys are unremarkable. The bladder is unremarkable. Stomach/Bowel: The stomach, duodenum, small bowel and colon are unremarkable. No inflammatory changes or obstructive findings. Stable colonic diverticulosis. Vascular/Lymphatic: Stable age advanced atherosclerotic calcifications involving the aorta and branch vessels but no aneurysm or dissection. The major venous structures are patent. Stable 7 mm left para-aortic node on image 43/2. 7.5 mm node medial to the left common iliac artery on image 63/2 previously measured 6 mm. Left internal iliac node on image 66/2 measures 11 mm and previously measured 8 mm. 12 mm internal iliac node on the left on image 76/2 previously measured 9 mm. New small sub 5 mm perirectal and sigmoid mesocolon nodes. Reproductive: Status post hysterectomy and bilateral oophorectomy. Other: Enlarging soft tissue mass associated with the left vaginal fornix. This measures approximately 3.1 x 2.5 cm on image 85/2 this previously measured approximately 2.0 x 1.4 cm. There is also prominence of the right vaginal fornix with slight nodularity measuring approximately 2.4 x 2.0 cm on image 84/2. This previously measured 1.7 x 1.6 cm. 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 Speculum examination and re-biopsy may be confirmatory. PET-CT may be helpful also. Musculoskeletal: No significant bony findings. IMPRESSION: 1. Enlarging soft tissue masses involving the vaginal cuff. 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 Speculum examination and re-biopsy may be confirmatory. PET-CT may be helpful. 2. New small sub 5 mm perirectal and sigmoid mesocolon nodes. 3. Slight progression of pelvic adenopathy as detailed above. 4. No findings suspicious for abdominal metastatic disease or osseous metastatic disease. 5. Stable age advanced  atherosclerotic calcifications involving the aorta and branch vessels and coronary arteries. Aortic Atherosclerosis (ICD10-I70.0). Electronically Signed   By: Marijo Sanes M.D.   On: 12/22/2021 09:58   DG Chest Port 1 View  Result Date: 01/07/2022 CLINICAL DATA:  Questionable sepsis - evaluate for abnormality Nausea and weakness. EXAM: PORTABLE CHEST 1 VIEW COMPARISON:  07/09/2021 FINDINGS: Right chest port in place. Lung volumes are low. Mild vascular congestion without pulmonary edema. The heart is normal in size. Coronary stent. No focal airspace disease. No pleural effusion or pneumothorax. No acute osseous abnormalities are seen. IMPRESSION: Low lung volumes with mild vascular congestion. Electronically Signed   By: Keith Rake M.D.   On: 01/07/2022 23:48   ECHOCARDIOGRAM COMPLETE  Result Date: 12/25/2021    ECHOCARDIOGRAM REPORT   Patient Name:   LISAANN ATHA Date of Exam: 12/25/2021 Medical Rec #:  [id number]     Height:       62.0 in Accession #:    [id number]    Weight:       232.6 lb Date of Birth:  [birth year]    BSA:          2.038 m Patient Age:    54 years      BP:           132/79 mmHg Patient Gender: F             HR:  55 bpm. Exam Location:  Outpatient Procedure: Strain Analysis, 3D Echo and 2D Echo Indications:    Chemo.  History:        Patient has prior history of Echocardiogram examinations, most                 recent 02/19/2021. CAD, chronic kidney disease; Risk                 Factors:Diabetes, Dyslipidemia and Hypertension.  Sonographer:    [place] Referring Phys: [phone] NI [place]  1. Global longitudinal strain is -16.1% Mild basal inferior hypoinesis     Compared to echo from 02/2021, no change in overall LVEF /regional wall motion. Global longitudinal strain is mildly less negative (-17.9% to -16.1%. Left ventricular ejection fraction, by estimation, is 65 to 70%. The left ventricle has normal function. The left ventricle has no regional wall  motion abnormalities. There is mild left ventricular hypertrophy. Left ventricular diastolic parameters are indeterminate.  2. Right ventricular systolic function is normal. The right ventricular size is normal. There is normal pulmonary artery systolic pressure.  3. Trivial mitral valve regurgitation.  4. The aortic valve is normal in structure. Aortic valve regurgitation is not visualized.  5. The inferior vena cava is normal in size with greater than 50% respiratory variability, suggesting right atrial pressure of 3 mmHg. FINDINGS  Left Ventricle: Global longitudinal strain is -16.1% Mild basal inferior hypoinesis Compared to echo from 02/2021, no change in overall LVEF /regional wall motion. Global longitudinal strain is mildly less negative (-17.9% to -16.1%. Left ventricular ejection fraction, by estimation, is 65 to 70%. The left ventricle has normal function. The left ventricle has no regional wall motion abnormalities. The left ventricular internal cavity size was normal in size. There is mild left ventricular hypertrophy. Left ventricular diastolic parameters are indeterminate. Right Ventricle: The right ventricular size is normal. Right vetricular wall thickness was not assessed. Right ventricular systolic function is normal. There is normal pulmonary artery systolic pressure. The tricuspid regurgitant velocity is 2.82 m/s, and with an assumed right atrial pressure of 3 mmHg, the estimated right ventricular systolic pressure is 02.7 mmHg. Left Atrium: Left atrial size was normal in size. Right Atrium: Right atrial size was normal in size. Pericardium: There is no evidence of pericardial effusion. Mitral Valve: Mild mitral annular calcification. Trivial mitral valve regurgitation. Tricuspid Valve: The tricuspid valve is normal in structure. Tricuspid valve regurgitation is trivial. Aortic Valve: The aortic valve is normal in structure. Aortic valve regurgitation is not visualized. Pulmonic Valve: The  pulmonic valve was not well visualized. Pulmonic valve regurgitation is trivial. No evidence of pulmonic stenosis. Aorta: The aortic root is normal in size and structure. Venous: The inferior vena cava is normal in size with greater than 50% respiratory variability, suggesting right atrial pressure of 3 mmHg. IAS/Shunts: No atrial level shunt detected by color flow Doppler.  LEFT VENTRICLE PLAX 2D LVIDd:         4.70 cm   Diastology LVIDs:         3.10 cm   LV e' medial:    4.35 cm/s LV PW:         1.20 cm   LV E/e' medial:  17.3 LV IVS:        0.90 cm   LV e' lateral:   5.98 cm/s LVOT diam:     1.80 cm   LV E/e' lateral: 12.6 LV SV:  68 LV SV Index:   33 LVOT Area:     2.54 cm                           3D Volume EF:                          3D EF:        62 %                          LV EDV:       97 ml                          LV ESV:       37 ml                          LV SV:        61 ml RIGHT VENTRICLE             IVC RV S prime:     12.30 cm/s  IVC diam: 1.50 cm TAPSE (M-mode): 2.0 cm LEFT ATRIUM             Index        RIGHT ATRIUM           Index LA diam:        3.90 cm 1.91 cm/m   RA Area:     11.50 cm LA Vol (A2C):   57.6 ml 28.26 ml/m  RA Volume:   23.90 ml  11.72 ml/m LA Vol (A4C):   64.1 ml 31.45 ml/m LA Biplane Vol: 63.9 ml 31.35 ml/m  AORTIC VALVE LVOT Vmax:   108.00 cm/s LVOT Vmean:  71.000 cm/s LVOT VTI:    0.266 m  AORTA Ao Root diam: 2.90 cm MITRAL VALVE               TRICUSPID VALVE MV Area (PHT): 2.17 cm    TR Peak grad:   31.8 mmHg MV Decel Time: 349 msec    TR Vmax:        282.00 cm/s MV E velocity: 75.10 cm/s MV A velocity: 98.10 cm/s  SHUNTS MV E/A ratio:  0.77        Systemic VTI:  0.27 m                            Systemic Diam: 1.80 cm Dorris Carnes MD Electronically signed by Dorris Carnes MD Signature Date/Time: 12/25/2021/1:02:21 PM    Final

## 2022-01-10 NOTE — Progress Notes (Signed)
Oncology Discharge Planning Admission Note  [facility] at [facility] Address: [address], [place], [place] [address] Hours of Operation:  8am - 5pm, Monday - Friday  Clinic Contact Information:  [phone]  Oncology Care Team: Medical Oncologist:  Dr Heath Lark  Dr. Alvy Bimler is aware of this hospital admission dated 01/09/22 and has assessed patient at bedside.  The cancer center will follow [name] inpatient care to assist with discharge planning as indicated by the oncologist.  We will  arrange follow up care.  Disclaimer:  This [place] note does not imply a formal consult request has been made by the admitting attending for this admission or there will be an inpatient consult completed by oncology.  Please request oncology consults as per standard process as indicated.

## 2022-01-10 NOTE — Progress Notes (Signed)
Went over discharge papers with patient and family.  All quesitons answered.  VSS.  AVS given to patient.  Pt wheeled out via NT.

## 2022-01-10 NOTE — Progress Notes (Signed)
PHARMACY - PHYSICIAN COMMUNICATION CRITICAL VALUE ALERT - BLOOD CULTURE IDENTIFICATION (BCID)  ZAKIRA RESSEL is an 66 y.o. female who presented to [facility] on 01/07/2022 with a chief complaint of abdominal pain, n/v.  Assessment:  1/4 BCx bottles growing GPR (suspect contaminant); no BCID run  Name of physician (or Provider) Contacted: Lyman Speller  Current antibiotics: Rocephin (r/o UTI)  Changes to prescribed antibiotics recommended: no additional coverage necessary; consider stopping Rocephin if no convincing indication of UTI 48 hr into admission Recommendations accepted by provider  No results found for this or any previous visit.  Nayab Aten A 01/10/2022  8:03 AM

## 2022-01-10 NOTE — Discharge Summary (Addendum)
Physician Discharge Summary   Patient: [name] MRN: [id number] DOB: Jul 26, 1956  Admit date:     01/07/2022  Discharge date: 01/10/22  Discharge Physician: Annita Brod   PCP: [name], MD   Recommendations at discharge:   Follow-up with oncology as scheduled.  Discharge Diagnoses Active Problems:   Hypertension   Diabetes mellitus without complication ([place])   Morbid obesity with BMI of 40.0-44.9, adult (HCC)  Principal Problem (Resolved):   Abdominal pain Resolved Problems:   Nausea & vomiting   Hospital Course   66 year old female past medical history of CAD, diabetes mellitus, hypertension and endometrial cancer presented to the emergency room on 1/23 with complaints of abdominal pain with nausea and vomiting.  This has been going on since her last chemotherapy session a week prior.  Patient brought into the hospitalist service.  Noted to have acute kidney injury as well as pyuria started on IV Rocephin as well as given IV fluids, antiemetics and pain medication.    Patient seen by oncology and by 1/25, some improvement.  Diet being slowly advanced.  Patient initially had some mild discomfort with dinner on 1/25, about 1/26, tolerating soft bland diet for breakfast and lunch.  Felt better and eager to go home.  Vital signs and labs stable.  * Abdominal pain-resolved as of 01/10/2022, (present on admission) Unclear etiology, possibly pancreatitis versus duodenitis.  Not typical side effect of her current chemotherapy regimen.  Looks to be resolving.  Tolerating bland diet.  Continue on PPI at home.  Nausea & vomiting-resolved as of 01/10/2022, (present on admission) Possible gastroenteritis versus reaction to chemotherapy.  Resolved.  Tolerating p.o.  Hypertension- (present on admission) Uncontrolled hypertension from pain.  Continue her home antihypertensives  Diabetes mellitus without complication (HCC) Follow CBGs, continue sliding scale  Morbid obesity  with BMI of 40.0-44.9, adult [facility]) Patient meets criteria BMI greater than 40.   AKI: Mild, secondary to poor po.    Consultants: Oncology Procedures performed: None Disposition: Home Diet recommendation: Soft bland diet until patient feels fully recovered and then carb modified low-sodium diet  DISCHARGE MEDICATION: Allergies as of 01/10/2022   No Known Allergies      Medication List     TAKE these medications    aspirin 81 MG EC tablet Take 1 tablet (81 mg total) by mouth daily.   atorvastatin 80 MG tablet Commonly known as: LIPITOR TAKE 1 TABLET (80 MG TOTAL) BY MOUTH AT BEDTIME.   dexamethasone 4 MG tablet Commonly known as: DECADRON Take 2 tablets by mouth the night before and 2 tablets the morning of chemotherapy, every 3 weeks x 6 cycles   furosemide 20 MG tablet Commonly known as: LASIX Take 2 tablets (40 mg total) by mouth daily.   glimepiride 4 MG tablet Commonly known as: AMARYL Take 1 tablet (4 mg total) by mouth daily with breakfast.   isosorbide mononitrate 30 MG 24 hr tablet Commonly known as: IMDUR Take 2 tablets (60 mg total) by mouth daily.   lisinopril 20 MG tablet Commonly known as: ZESTRIL TAKE 1 TABLET BY MOUTH ONCE A DAY   metFORMIN 500 MG tablet Commonly known as: GLUCOPHAGE Take 2 tablets (1,000 mg total) by mouth 2 (two) times daily with a meal.   metoprolol tartrate 100 MG tablet Commonly known as: LOPRESSOR TAKE 1 TABLET (100 MG TOTAL) BY MOUTH 2 (TWO) TIMES DAILY.   nitroGLYCERIN 0.4 MG SL tablet Commonly known as: NITROSTAT Place 1 tablet (0.4 mg total) under  the tongue every 5 (five) minutes as needed for chest pain.   ondansetron 8 MG tablet Commonly known as: Zofran Take 1 tablet by mouth 2  times daily as needed for refractory nausea / vomiting. Start on day 3 after carboplatin chemo.   pantoprazole 40 MG tablet Commonly known as: PROTONIX Take 1 tablet (40 mg total) by mouth daily.   prochlorperazine 10 MG  tablet Commonly known as: COMPAZINE Take 1 tablet by mouth every 6  hours as needed (Nausea or vomiting).   Trulicity 1.5 TG/2.5WL Sopn Generic drug: Dulaglutide Inject 1.5 mg into the skin once a week. What changed: additional instructions         Discharge Exam: Filed Weights   01/08/22 1929 01/09/22 0434 01/09/22 0445  Weight: 106.6 kg 102.5 kg 101.8 kg   General: Alert and oriented x3, no acute distress, fatigued Cardiovascular: Regular rate and rhythm, S1-S2 Lungs: Clear auscultation bilaterally  Condition at discharge: improving  The results of significant diagnostics from this hospitalization (including imaging, microbiology, ancillary and laboratory) are listed below for reference.   Imaging Studies: CT ABDOMEN PELVIS W CONTRAST  Result Date: 01/08/2022 CLINICAL DATA:  Abdominal pain. History of uterine/cervical cancer status post prior hysterectomy. EXAM: CT ABDOMEN AND PELVIS WITH CONTRAST TECHNIQUE: Multidetector CT imaging of the abdomen and pelvis was performed using the standard protocol following bolus administration of intravenous contrast. RADIATION DOSE REDUCTION: This exam was performed according to the departmental dose-optimization program which includes automated exposure control, adjustment of the mA and/or kV according to patient size and/or use of iterative reconstruction technique. CONTRAST:  145mL OMNIPAQUE IOHEXOL 300 MG/ML  SOLN COMPARISON:  CT abdomen pelvis dated 12/21/2021. FINDINGS: Lower chest: The visualized lung bases are clear. There is coronary vascular calcification. No intra-abdominal free air or free fluid. Hepatobiliary: The liver is unremarkable. No intrahepatic biliary dilatation. The gallbladder is unremarkable. Pancreas: There is mild haziness adjacent to the head and uncinate process of the pancreas, likely related to inflammatory changes of the duodenum. Acute pancreatitis is not excluded correlation with pancreatic enzymes recommended. No  drainable fluid collection/abscess or pseudocyst. Spleen: Normal in size without focal abnormality. Adrenals/Urinary Tract: The adrenal glands unremarkable. The kidneys, visualized ureters, and urinary bladder appear unremarkable. Stomach/Bowel: There is sigmoid diverticulosis without active inflammatory changes. There is inflammatory changes of the duodenal C-loop which may represent duodenitis. There is no bowel obstruction. The appendix is normal. Vascular/Lymphatic: Moderate aortoiliac atherosclerotic disease. The IVC is unremarkable. No portal venous gas. Slightly enlarged appearance of a mildly enlarged left pelvic sidewall lymph node measuring 14 mm short axis (66/2). Mildly enlarged lymph node medial to the left psoas measures 12 mm short axis. Reproductive: Hysterectomy. Grossly similar or slightly enlarged ovoid mass in the left pelvis measuring 3.7 x 2.1 cm (previously 3.1 x 2.5 cm) associated with left vaginal fornix. Similar appearance of the right vaginal fornix with soft tissue nodularity. Several small mildly rounded perirectal lymph nodes again noted Other: None Musculoskeletal: Degenerative changes of the spine. No acute osseous pathology. IMPRESSION: 1. Findings may represent duodenitis versus pancreatitis. Clinical correlation is recommended. 2. Sigmoid diverticulosis. No bowel obstruction. Normal appendix. 3. Slightly enlarged soft tissue masses associated with vaginal cuff as well as increase in the size of the retroperitoneal adenopathy compared to the prior CT. 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 Clinical correlation is recommended. 4. Aortic Atherosclerosis (ICD10-I70.0). Electronically Signed   By: Anner Crete M.D.   On: 01/08/2022 00:20   CT ABDOMEN PELVIS W CONTRAST  Result Date: 12/22/2021 CLINICAL DATA:  History of uterine/cervical cancer status post hysterectomy. EXAM: CT ABDOMEN AND PELVIS WITH CONTRAST TECHNIQUE: Multidetector CT imaging of the abdomen and pelvis was  performed using the standard protocol following bolus administration of intravenous contrast. CONTRAST:  42mL OMNIPAQUE IOHEXOL 350 MG/ML SOLN COMPARISON:  09/28/2021 FINDINGS: Lower chest: The lung bases are clear of acute process. No pleural effusion or pulmonary lesions. The heart is normal in size. No pericardial effusion. Stable age advanced coronary artery calcifications. The distal esophagus and aorta are unremarkable. Hepatobiliary: No hepatic lesions or evidence of peritoneal surface disease. The gallbladder is unremarkable. No intra or extrahepatic biliary dilatation. Pancreas: No mass, inflammation or ductal dilatation. Spleen: Normal size. No focal lesions. Adrenals/Urinary Tract: Adrenal glands and kidneys are unremarkable. The bladder is unremarkable. Stomach/Bowel: The stomach, duodenum, small bowel and colon are unremarkable. No inflammatory changes or obstructive findings. Stable colonic diverticulosis. Vascular/Lymphatic: Stable age advanced atherosclerotic calcifications involving the aorta and branch vessels but no aneurysm or dissection. The major venous structures are patent. Stable 7 mm left para-aortic node on image 43/2. 7.5 mm node medial to the left common iliac artery on image 63/2 previously measured 6 mm. Left internal iliac node on image 66/2 measures 11 mm and previously measured 8 mm. 12 mm internal iliac node on the left on image 76/2 previously measured 9 mm. New small sub 5 mm perirectal and sigmoid mesocolon nodes. Reproductive: Status post hysterectomy and bilateral oophorectomy. Other: Enlarging soft tissue mass associated with the left vaginal fornix. This measures approximately 3.1 x 2.5 cm on image 85/2 this previously measured approximately 2.0 x 1.4 cm. There is also prominence of the right vaginal fornix with slight nodularity measuring approximately 2.4 x 2.0 cm on image 84/2. This previously measured 1.7 x 1.6 cm. Findings are highly suspicious for recurrent tumor  involving the upper vagina. Speculum examination and re-biopsy may be confirmatory. PET-CT may be helpful also. Musculoskeletal: No significant bony findings. IMPRESSION: 1. Enlarging soft tissue masses involving the vaginal cuff. 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 Speculum examination and re-biopsy may be confirmatory. PET-CT may be helpful. 2. New small sub 5 mm perirectal and sigmoid mesocolon nodes. 3. Slight progression of pelvic adenopathy as detailed above. 4. No findings suspicious for abdominal metastatic disease or osseous metastatic disease. 5. Stable age advanced atherosclerotic calcifications involving the aorta and branch vessels and coronary arteries. Aortic Atherosclerosis (ICD10-I70.0). Electronically Signed   By: Marijo Sanes M.D.   On: 12/22/2021 09:58   DG Chest Port 1 View  Result Date: 01/07/2022 CLINICAL DATA:  Questionable sepsis - evaluate for abnormality Nausea and weakness. EXAM: PORTABLE CHEST 1 VIEW COMPARISON:  07/09/2021 FINDINGS: Right chest port in place. Lung volumes are low. Mild vascular congestion without pulmonary edema. The heart is normal in size. Coronary stent. No focal airspace disease. No pleural effusion or pneumothorax. No acute osseous abnormalities are seen. IMPRESSION: Low lung volumes with mild vascular congestion. Electronically Signed   By: Keith Rake M.D.   On: 01/07/2022 23:48   ECHOCARDIOGRAM COMPLETE  Result Date: 12/25/2021    ECHOCARDIOGRAM REPORT   Patient Name:   KATHLENE YANO Date of Exam: 12/25/2021 Medical Rec #:  [id number]     Height:       62.0 in Accession #:    [id number]    Weight:       232.6 lb Date of Birth:  1956-08-19    BSA:  2.038 m Patient Age:    68 years      BP:           132/79 mmHg Patient Gender: F             HR:           55 bpm. Exam Location:  Outpatient Procedure: Strain Analysis, 3D Echo and 2D Echo Indications:    Chemo.  History:        Patient has prior history of  Echocardiogram examinations, most                 recent 02/19/2021. CAD, chronic kidney disease; Risk                 Factors:Diabetes, Dyslipidemia and Hypertension.  Sonographer:    [place] Referring Phys: [phone] [facility]  1. Global longitudinal strain is -16.1% Mild basal inferior hypoinesis     Compared to echo from 02/2021, no change in overall LVEF /regional wall motion. Global longitudinal strain is mildly less negative (-17.9% to -16.1%. Left ventricular ejection fraction, by estimation, is 65 to 70%. The left ventricle has normal function. The left ventricle has no regional wall motion abnormalities. There is mild left ventricular hypertrophy. Left ventricular diastolic parameters are indeterminate.  2. Right ventricular systolic function is normal. The right ventricular size is normal. There is normal pulmonary artery systolic pressure.  3. Trivial mitral valve regurgitation.  4. The aortic valve is normal in structure. Aortic valve regurgitation is not visualized.  5. The inferior vena cava is normal in size with greater than 50% respiratory variability, suggesting right atrial pressure of 3 mmHg. FINDINGS  Left Ventricle: Global longitudinal strain is -16.1% Mild basal inferior hypoinesis Compared to echo from 02/2021, no change in overall LVEF /regional wall motion. Global longitudinal strain is mildly less negative (-17.9% to -16.1%. Left ventricular ejection fraction, by estimation, is 65 to 70%. The left ventricle has normal function. The left ventricle has no regional wall motion abnormalities. The left ventricular internal cavity size was normal in size. There is mild left ventricular hypertrophy. Left ventricular diastolic parameters are indeterminate. Right Ventricle: The right ventricular size is normal. Right vetricular wall thickness was not assessed. Right ventricular systolic function is normal. There is normal pulmonary artery systolic pressure. The tricuspid  regurgitant velocity is 2.82 m/s, and with an assumed right atrial pressure of 3 mmHg, the estimated right ventricular systolic pressure is 29.9 mmHg. Left Atrium: Left atrial size was normal in size. Right Atrium: Right atrial size was normal in size. Pericardium: There is no evidence of pericardial effusion. Mitral Valve: Mild mitral annular calcification. Trivial mitral valve regurgitation. Tricuspid Valve: The tricuspid valve is normal in structure. Tricuspid valve regurgitation is trivial. Aortic Valve: The aortic valve is normal in structure. Aortic valve regurgitation is not visualized. Pulmonic Valve: The pulmonic valve was not well visualized. Pulmonic valve regurgitation is trivial. No evidence of pulmonic stenosis. Aorta: The aortic root is normal in size and structure. Venous: The inferior vena cava is normal in size with greater than 50% respiratory variability, suggesting right atrial pressure of 3 mmHg. IAS/Shunts: No atrial level shunt detected by color flow Doppler.  LEFT VENTRICLE PLAX 2D LVIDd:         4.70 cm   Diastology LVIDs:         3.10 cm   LV e' medial:    4.35 cm/s LV PW:  1.20 cm   LV E/e' medial:  17.3 LV IVS:        0.90 cm   LV e' lateral:   5.98 cm/s LVOT diam:     1.80 cm   LV E/e' lateral: 12.6 LV SV:         68 LV SV Index:   33 LVOT Area:     2.54 cm                           3D Volume EF:                          3D EF:        62 %                          LV EDV:       97 ml                          LV ESV:       37 ml                          LV SV:        61 ml RIGHT VENTRICLE             IVC RV S prime:     12.30 cm/s  IVC diam: 1.50 cm TAPSE (M-mode): 2.0 cm LEFT ATRIUM             Index        RIGHT ATRIUM           Index LA diam:        3.90 cm 1.91 cm/m   RA Area:     11.50 cm LA Vol (A2C):   57.6 ml 28.26 ml/m  RA Volume:   23.90 ml  11.72 ml/m LA Vol (A4C):   64.1 ml 31.45 ml/m LA Biplane Vol: 63.9 ml 31.35 ml/m  AORTIC VALVE LVOT Vmax:   108.00 cm/s LVOT  Vmean:  71.000 cm/s LVOT VTI:    0.266 m  AORTA Ao Root diam: 2.90 cm MITRAL VALVE               TRICUSPID VALVE MV Area (PHT): 2.17 cm    TR Peak grad:   31.8 mmHg MV Decel Time: 349 msec    TR Vmax:        282.00 cm/s MV E velocity: 75.10 cm/s MV A velocity: 98.10 cm/s  SHUNTS MV E/A ratio:  0.77        Systemic VTI:  0.27 m                            Systemic Diam: 1.80 cm Dorris Carnes MD Electronically signed by Dorris Carnes MD Signature Date/Time: 12/25/2021/1:02:21 PM    Final     Microbiology: Results for orders placed or performed during the hospital encounter of 01/07/22  Urine Culture     Status: Abnormal   Collection Time: 01/07/22 10:30 PM   Specimen: In/Out Cath Urine  Result Value Ref Range Status   Specimen Description   Final    IN/OUT CATH URINE Performed at [facility], [place] [address] [place], [place] [address]    Special Requests  Final    NONE Performed at [facility], [place] [address] [place], [place] [address]    Culture MULTIPLE SPECIES PRESENT, SUGGEST RECOLLECTION (A)  Final   Report Status 01/09/2022 FINAL  Final  Resp Panel by RT-PCR (Flu A&B, Covid) Nasopharyngeal Swab     Status: None   Collection Time: 01/07/22 11:23 PM   Specimen: Nasopharyngeal Swab; Nasopharyngeal(NP) swabs in vial transport medium  Result Value Ref Range Status   SARS Coronavirus 2 by RT PCR NEGATIVE NEGATIVE Final    Comment: (NOTE) SARS-CoV-2 target nucleic acids are NOT DETECTED.  The SARS-CoV-2 RNA is generally detectable in upper respiratory specimens during the acute phase of infection. The lowest concentration of SARS-CoV-2 viral copies this assay can detect is 138 copies/mL. A negative result does not preclude SARS-Cov-2 infection and should not be used as the sole basis for treatment or other patient management decisions. A negative result may occur with  improper specimen collection/handling, submission of specimen other than  nasopharyngeal swab, presence of viral mutation(s) within the areas targeted by this assay, and inadequate number of viral copies(<138 copies/mL). A negative result must be combined with clinical observations, patient history, and epidemiological information. The expected result is Negative.  Fact Sheet for Patients:  EntrepreneurPulse.com.au  Fact Sheet for Healthcare Providers:  IncredibleEmployment.be  This test is no t yet approved or cleared by the [place] FDA and  has been authorized for detection and/or diagnosis of SARS-CoV-2 by FDA under an Emergency Use Authorization (EUA). This EUA will remain  in effect (meaning this test can be used) for the duration of the COVID-19 declaration under Section 564(b)(1) of the Act, 21 U.S.C.section 360bbb-3(b)(1), unless the authorization is terminated  or revoked sooner.       Influenza A by PCR NEGATIVE NEGATIVE Final   Influenza B by PCR NEGATIVE NEGATIVE Final    Comment: (NOTE) The Xpert Xpress SARS-CoV-2/FLU/RSV plus assay is intended as an aid in the diagnosis of influenza from Nasopharyngeal swab specimens and should not be used as a sole basis for treatment. Nasal washings and aspirates are unacceptable for Xpert Xpress SARS-CoV-2/FLU/RSV testing.  Fact Sheet for Patients: EntrepreneurPulse.com.au  Fact Sheet for Healthcare Providers: IncredibleEmployment.be  This test is not yet approved or cleared by the [place] FDA and has been authorized for detection and/or diagnosis of SARS-CoV-2 by FDA under an Emergency Use Authorization (EUA). This EUA will remain in effect (meaning this test can be used) for the duration of the COVID-19 declaration under Section 564(b)(1) of the Act, 21 U.S.C. section 360bbb-3(b)(1), unless the authorization is terminated or revoked.  Performed at [facility], [place] [address] [place], [place] [address]   Blood Culture (routine x 2)     Status: None (Preliminary result)   Collection Time: 01/08/22 12:15 AM   Specimen: BLOOD  Result Value Ref Range Status   Specimen Description   Final    BLOOD LEFT ANTECUBITAL Performed at [place] [address], [place], [place] [address]    Special Requests   Final    BOTTLES DRAWN AEROBIC AND ANAEROBIC Blood Culture adequate volume Performed at [place] [address], [place], [place] [address]    Culture   Final    NO GROWTH 2 DAYS Performed at [place] [address] [place], [place] [address]    Report Status PENDING  Incomplete  Blood Culture (routine x 2)     Status:  None (Preliminary result)   Collection Time: 01/08/22 12:30 AM   Specimen: BLOOD  Result Value Ref Range Status   Specimen Description   Final    BLOOD RIGHT ANTECUBITAL Performed at [place] [address] [place], [place] [address]    Special Requests   Final    BOTTLES DRAWN AEROBIC AND ANAEROBIC Blood Culture adequate volume Performed at [place] [address] [place], [place] [address]    Culture  Setup Time   Final    GRAM POSITIVE RODS ANAEROBIC BOTTLE ONLY CRITICAL RESULT CALLED TO, READ BACK BY AND VERIFIED WITH: PHARMD M LILISTON [id number] AT 38 AM BY CM    Culture   Final    CULTURE REINCUBATED FOR BETTER GROWTH Performed at [facility], [place] [address] [place], [place] [address]    Report Status PENDING  Incomplete  Gastrointestinal Panel by PCR , Stool     Status: None   Collection Time: 01/09/22 10:04 AM   Specimen: Stool  Result Value Ref Range Status   Campylobacter species NOT DETECTED NOT DETECTED Final   Plesimonas shigelloides NOT DETECTED NOT DETECTED Final   Salmonella species NOT DETECTED NOT DETECTED Final   Yersinia enterocolitica NOT DETECTED NOT DETECTED Final   Vibrio species NOT DETECTED NOT  DETECTED Final   Vibrio cholerae NOT DETECTED NOT DETECTED Final   Enteroaggregative E coli (EAEC) NOT DETECTED NOT DETECTED Final   Enteropathogenic E coli (EPEC) NOT DETECTED NOT DETECTED Final   Enterotoxigenic E coli (ETEC) NOT DETECTED NOT DETECTED Final   Shiga like toxin producing E coli (STEC) NOT DETECTED NOT DETECTED Final   Shigella/Enteroinvasive E coli (EIEC) NOT DETECTED NOT DETECTED Final   Cryptosporidium NOT DETECTED NOT DETECTED Final   Cyclospora cayetanensis NOT DETECTED NOT DETECTED Final   Entamoeba histolytica NOT DETECTED NOT DETECTED Final   Giardia lamblia NOT DETECTED NOT DETECTED Final   Adenovirus F40/41 NOT DETECTED NOT DETECTED Final   Astrovirus NOT DETECTED NOT DETECTED Final   Norovirus GI/GII NOT DETECTED NOT DETECTED Final   Rotavirus A NOT DETECTED NOT DETECTED Final   Sapovirus (I, II, IV, and V) NOT DETECTED NOT DETECTED Final    Comment: Performed at [facility], [place] [place], [place] [address]    Labs: CBC: Recent Labs  Lab 01/07/22 2230 01/08/22 1000 01/09/22 0427 01/10/22 0427  WBC 4.2 2.3* 2.9* 3.2*  HGB 11.1* 10.2* 10.0* 9.6*  HCT 33.4* 30.7* 30.6* 29.5*  MCV 88.1 89.0 90.3 90.5  PLT 204 179 174 824   Basic Metabolic Panel: Recent Labs  Lab 01/07/22 2230 01/08/22 1000 01/09/22 0427 01/10/22 0427  NA 134*  --  137 135  K 4.2  --  5.2* 3.9  CL 100  --  102 104  CO2 24  --  28 23  GLUCOSE 250*  --  174* 219*  BUN 19  --  17 20  CREATININE 1.27* 1.05* 0.88 1.21*  CALCIUM 9.0  --  8.9 8.4*   Liver Function Tests: Recent Labs  Lab 01/07/22 2230 01/09/22 0427  AST 21 30  ALT 27 27  ALKPHOS 94 82  BILITOT 0.7 0.6  PROT 7.5 6.8  ALBUMIN 3.5 3.2*   CBG: Recent Labs  Lab 01/09/22 1202 01/09/22 1707 01/09/22 2041 01/10/22 0807 01/10/22 1228  GLUCAP 217* 175* 252* 186* 313*    Discharge time spent: less than 30 minutes.  Signed: Annita Brod, MD [facility] 01/10/2022

## 2022-01-11 ENCOUNTER — Telehealth: Payer: Self-pay

## 2022-01-11 ENCOUNTER — Telehealth: Payer: Self-pay | Admitting: *Deleted

## 2022-01-11 NOTE — Telephone Encounter (Signed)
Called and left below message asking her to call the office back. 

## 2022-01-11 NOTE — Telephone Encounter (Signed)
Transition Care Management Unsuccessful Follow-up Telephone Call  Date of discharge and from where:  [facility] on 01/10/2022  Attempts:  1st Attempt  Reason for unsuccessful TCM follow-up call:  Left voice message unable to reach patient. Call back requested.

## 2022-01-11 NOTE — Telephone Encounter (Signed)
Called back and spoke with daughter. [name] is doing okay and glad to be home. She is able to eat and drink with no problems. She had a little pain this morning and took tylenol and got relief. They will call the office back if needed and appreciated the call.

## 2022-01-11 NOTE — Telephone Encounter (Signed)
Latandra Aguon's daughter [name] was contacted by telephone to verify understanding of discharge instructions status post their most recent discharge from the hospital on the date:  01/10/22.  Inpatient discharge AVS was re-reviewed with patient, along with cancer center appointments.  Verification of understanding for oncology specific follow-up was validated using the Teach Back method.    Transportation to appointments were confirmed for the patient as being self/caregiver.  All questions were addressed to their satisfaction upon completion of this post discharge follow-up call for outpatient oncology.

## 2022-01-11 NOTE — Telephone Encounter (Signed)
-----   Message from Heath Lark, MD sent at 01/11/2022  8:47 AM EST ----- She was DC yesterday, can you call and ask how she is doing?

## 2022-01-12 ENCOUNTER — Other Ambulatory Visit: Payer: Self-pay | Admitting: Physician Assistant

## 2022-01-12 DIAGNOSIS — I5032 Chronic diastolic (congestive) heart failure: Secondary | ICD-10-CM

## 2022-01-12 DIAGNOSIS — I10 Essential (primary) hypertension: Secondary | ICD-10-CM

## 2022-01-12 DIAGNOSIS — E1159 Type 2 diabetes mellitus with other circulatory complications: Secondary | ICD-10-CM

## 2022-01-13 LAB — CULTURE, BLOOD (ROUTINE X 2)
Culture: NO GROWTH
Special Requests: ADEQUATE
Special Requests: ADEQUATE

## 2022-01-14 ENCOUNTER — Other Ambulatory Visit: Payer: Self-pay

## 2022-01-14 ENCOUNTER — Telehealth: Payer: Self-pay

## 2022-01-14 ENCOUNTER — Other Ambulatory Visit (HOSPITAL_COMMUNITY): Payer: Self-pay

## 2022-01-14 ENCOUNTER — Other Ambulatory Visit: Payer: Self-pay | Admitting: Family Medicine

## 2022-01-14 ENCOUNTER — Encounter: Payer: Self-pay | Admitting: Hematology and Oncology

## 2022-01-14 DIAGNOSIS — E1159 Type 2 diabetes mellitus with other circulatory complications: Secondary | ICD-10-CM

## 2022-01-14 DIAGNOSIS — I5032 Chronic diastolic (congestive) heart failure: Secondary | ICD-10-CM

## 2022-01-14 DIAGNOSIS — I10 Essential (primary) hypertension: Secondary | ICD-10-CM

## 2022-01-14 MED ORDER — METFORMIN HCL 500 MG PO TABS
1000.0000 mg | ORAL_TABLET | Freq: Two times a day (BID) | ORAL | 2 refills | Status: DC
  Filled 2022-01-14: qty 120, 30d supply, fill #0
  Filled 2022-03-06: qty 120, 30d supply, fill #1

## 2022-01-14 MED ORDER — ISOSORBIDE MONONITRATE ER 30 MG PO TB24
60.0000 mg | ORAL_TABLET | Freq: Every day | ORAL | 2 refills | Status: DC
  Filled 2022-01-14: qty 60, 30d supply, fill #0
  Filled 2022-03-06: qty 60, 30d supply, fill #1

## 2022-01-14 MED ORDER — LISINOPRIL 20 MG PO TABS
20.0000 mg | ORAL_TABLET | Freq: Every day | ORAL | 2 refills | Status: DC
  Filled 2022-01-14: qty 30, 30d supply, fill #0
  Filled 2022-03-06: qty 30, 30d supply, fill #1

## 2022-01-14 MED ORDER — METOPROLOL TARTRATE 100 MG PO TABS
ORAL_TABLET | Freq: Two times a day (BID) | ORAL | 2 refills | Status: DC
Start: 2022-01-14 — End: 2022-03-18
  Filled 2022-01-14: qty 60, 30d supply, fill #0
  Filled 2022-03-06: qty 60, 30d supply, fill #1

## 2022-01-14 MED ORDER — FUROSEMIDE 20 MG PO TABS
40.0000 mg | ORAL_TABLET | Freq: Every day | ORAL | 2 refills | Status: DC
  Filled 2022-01-14: qty 60, 30d supply, fill #0

## 2022-01-14 NOTE — Telephone Encounter (Signed)
Those medications do not need prior authorizations.  If she is requesting for refills I have done the refills.

## 2022-01-14 NOTE — Telephone Encounter (Signed)
From the discharge call:  Call completed with patient's daughter, [name].  She said her mother is doing okay, eating well, just tired.    Medication list reviewed.  Patient is waiting for delivery of atorvastatin, glimepiride, and pantoprazole.  [name] has contacted [facility] about other medications that are needed and the pharmacy is waiting for prior auths for furosemide, imdur, lisinopril, metformin and lopressor.   Scheduled to see Dr Margarita Rana  on 03/18/2022. Her daughter did not want to schedule an appointment for her to be seen sooner.    Scheduled to see oncology- 01/22/2022.

## 2022-01-14 NOTE — Telephone Encounter (Signed)
pre scriber not at practice. Requested Prescriptions  Refused Prescriptions Disp Refills   furosemide (LASIX) 20 MG tablet 60 tablet 2    Sig: Take 2 tablets (40 mg total) by mouth daily.     There is no refill protocol information for this order     isosorbide mononitrate (IMDUR) 30 MG 24 hr tablet 60 tablet 2    Sig: Take 2 tablets (60 mg total) by mouth daily.     There is no refill protocol information for this order     lisinopril (ZESTRIL) 20 MG tablet 30 tablet 2    Sig: TAKE 1 TABLET BY MOUTH ONCE A DAY     There is no refill protocol information for this order     metFORMIN (GLUCOPHAGE) 500 MG tablet 120 tablet 2    Sig: Take 2 tablets (1,000 mg total) by mouth 2 (two) times daily with a meal.     There is no refill protocol information for this order     metoprolol tartrate (LOPRESSOR) 100 MG tablet 60 tablet 2    Sig: TAKE 1 TABLET (100 MG TOTAL) BY MOUTH 2 (TWO) TIMES DAILY.     There is no refill protocol information for this order

## 2022-01-14 NOTE — Telephone Encounter (Signed)
Transition Care Management Follow-up Telephone Call  Call completed with patient's daughter, [name].  DPR on file to speak with her.  Date of discharge and from where: 01/10/2022, [facility]  How have you been since you were released from the hospital? She said her mother is doing okay, eating well, just tired.  Any questions or concerns? Yes - questions about medications noted below.   Items Reviewed: Did the pt receive and understand the discharge instructions provided? Yes  Medications obtained and verified?  Medication list reviewed.  Patient is waiting for delivery of atorvastatin, glimepiride, and pantoprazole.  [name] has contacted [facility] about other medications that are needed and the pharmacy is waiting for prior auths for furosemide, imdur, lisinopril, metformin and lopressor.  Other? No  Any new allergies since your discharge? No  Dietary orders reviewed? Yes Do you have support at home? Yes , her daughter  [place] and Equipment/Supplies: Were home health services ordered? no If so, what is the name of the agency? N/a  Has the agency set up a time to come to the patient's home? not applicable Were any new equipment or medical supplies ordered?  No What is the name of the medical supply agency? N/a Were you able to get the supplies/equipment? not applicable Do you have any questions related to the use of the equipment or supplies? No  She has a glucometer.   Functional Questionnaire: (I = Independent and D = Dependent) ADLs: independent. Has cane to use with ambulation.    Follow up appointments reviewed:  PCP Hospital f/u appt confirmed? Yes  Scheduled to see Dr Margarita Rana  on 03/18/2022. Her daughter did not want to schedule an appointment for her to be seen sooner.  [facility] f/u appt confirmed? Yes  Scheduled to see oncology- 01/22/2022. Are transportation arrangements needed? No  If their condition worsens, is the pt aware to call PCP or go to the  Emergency Dept.? Yes Was the patient provided with contact information for the PCP's office or ED? Yes Was to pt encouraged to call back with questions or concerns? Yes

## 2022-01-14 NOTE — Telephone Encounter (Signed)
Call placed to patient's daughter, [name], and informed her that Dr Margarita Rana did the refills for the medications requested, prior authorizations were not needed. [name] said she will call the pharmacy now and decide how they will get the medications.

## 2022-01-21 MED FILL — Dexamethasone Sodium Phosphate Inj 100 MG/10ML: INTRAMUSCULAR | Qty: 1 | Status: AC

## 2022-01-21 MED FILL — Fosaprepitant Dimeglumine For IV Infusion 150 MG (Base Eq): INTRAVENOUS | Qty: 5 | Status: AC

## 2022-01-22 ENCOUNTER — Other Ambulatory Visit: Payer: Self-pay | Admitting: Hematology and Oncology

## 2022-01-22 ENCOUNTER — Encounter: Payer: Self-pay | Admitting: Hematology and Oncology

## 2022-01-22 ENCOUNTER — Other Ambulatory Visit: Payer: Self-pay

## 2022-01-22 ENCOUNTER — Inpatient Hospital Stay (HOSPITAL_BASED_OUTPATIENT_CLINIC_OR_DEPARTMENT_OTHER): Payer: Medicaid Other | Admitting: Hematology and Oncology

## 2022-01-22 ENCOUNTER — Inpatient Hospital Stay: Payer: Medicaid Other | Attending: Gynecologic Oncology

## 2022-01-22 ENCOUNTER — Inpatient Hospital Stay: Payer: Medicaid Other

## 2022-01-22 ENCOUNTER — Other Ambulatory Visit (HOSPITAL_COMMUNITY): Payer: Self-pay

## 2022-01-22 VITALS — HR 90

## 2022-01-22 DIAGNOSIS — M25562 Pain in left knee: Secondary | ICD-10-CM

## 2022-01-22 DIAGNOSIS — I3481 Nonrheumatic mitral (valve) annulus calcification: Secondary | ICD-10-CM | POA: Diagnosis not present

## 2022-01-22 DIAGNOSIS — Z7985 Long-term (current) use of injectable non-insulin antidiabetic drugs: Secondary | ICD-10-CM | POA: Diagnosis not present

## 2022-01-22 DIAGNOSIS — C541 Malignant neoplasm of endometrium: Secondary | ICD-10-CM | POA: Insufficient documentation

## 2022-01-22 DIAGNOSIS — Z7982 Long term (current) use of aspirin: Secondary | ICD-10-CM | POA: Diagnosis not present

## 2022-01-22 DIAGNOSIS — N183 Chronic kidney disease, stage 3 unspecified: Secondary | ICD-10-CM | POA: Insufficient documentation

## 2022-01-22 DIAGNOSIS — Z79899 Other long term (current) drug therapy: Secondary | ICD-10-CM | POA: Insufficient documentation

## 2022-01-22 DIAGNOSIS — I10 Essential (primary) hypertension: Secondary | ICD-10-CM

## 2022-01-22 DIAGNOSIS — Z7984 Long term (current) use of oral hypoglycemic drugs: Secondary | ICD-10-CM | POA: Insufficient documentation

## 2022-01-22 DIAGNOSIS — D61818 Other pancytopenia: Secondary | ICD-10-CM

## 2022-01-22 DIAGNOSIS — R109 Unspecified abdominal pain: Secondary | ICD-10-CM | POA: Insufficient documentation

## 2022-01-22 DIAGNOSIS — I5032 Chronic diastolic (congestive) heart failure: Secondary | ICD-10-CM | POA: Insufficient documentation

## 2022-01-22 DIAGNOSIS — G629 Polyneuropathy, unspecified: Secondary | ICD-10-CM | POA: Insufficient documentation

## 2022-01-22 DIAGNOSIS — K5730 Diverticulosis of large intestine without perforation or abscess without bleeding: Secondary | ICD-10-CM | POA: Insufficient documentation

## 2022-01-22 DIAGNOSIS — I130 Hypertensive heart and chronic kidney disease with heart failure and stage 1 through stage 4 chronic kidney disease, or unspecified chronic kidney disease: Secondary | ICD-10-CM | POA: Diagnosis not present

## 2022-01-22 DIAGNOSIS — E1122 Type 2 diabetes mellitus with diabetic chronic kidney disease: Secondary | ICD-10-CM | POA: Insufficient documentation

## 2022-01-22 DIAGNOSIS — G620 Drug-induced polyneuropathy: Secondary | ICD-10-CM

## 2022-01-22 DIAGNOSIS — T451X5A Adverse effect of antineoplastic and immunosuppressive drugs, initial encounter: Secondary | ICD-10-CM

## 2022-01-22 DIAGNOSIS — Z5111 Encounter for antineoplastic chemotherapy: Secondary | ICD-10-CM | POA: Insufficient documentation

## 2022-01-22 DIAGNOSIS — M25569 Pain in unspecified knee: Secondary | ICD-10-CM | POA: Insufficient documentation

## 2022-01-22 DIAGNOSIS — M25561 Pain in right knee: Secondary | ICD-10-CM

## 2022-01-22 LAB — CBC WITH DIFFERENTIAL (CANCER CENTER ONLY)
Abs Immature Granulocytes: 0.01 10*3/uL (ref 0.00–0.07)
Basophils Absolute: 0 10*3/uL (ref 0.0–0.1)
Basophils Relative: 0 %
Eosinophils Absolute: 0 10*3/uL (ref 0.0–0.5)
Eosinophils Relative: 1 %
HCT: 30.7 % — ABNORMAL LOW (ref 36.0–46.0)
Hemoglobin: 10 g/dL — ABNORMAL LOW (ref 12.0–15.0)
Immature Granulocytes: 0 %
Lymphocytes Relative: 33 %
Lymphs Abs: 1.4 10*3/uL (ref 0.7–4.0)
MCH: 28.8 pg (ref 26.0–34.0)
MCHC: 32.6 g/dL (ref 30.0–36.0)
MCV: 88.5 fL (ref 80.0–100.0)
Monocytes Absolute: 0.5 10*3/uL (ref 0.1–1.0)
Monocytes Relative: 10 %
Neutro Abs: 2.5 10*3/uL (ref 1.7–7.7)
Neutrophils Relative %: 56 %
Platelet Count: 134 10*3/uL — ABNORMAL LOW (ref 150–400)
RBC: 3.47 MIL/uL — ABNORMAL LOW (ref 3.87–5.11)
RDW: 14.5 % (ref 11.5–15.5)
WBC Count: 4.4 10*3/uL (ref 4.0–10.5)
nRBC: 0 % (ref 0.0–0.2)

## 2022-01-22 LAB — CMP (CANCER CENTER ONLY)
ALT: 22 U/L (ref 0–44)
AST: 15 U/L (ref 15–41)
Albumin: 3.8 g/dL (ref 3.5–5.0)
Alkaline Phosphatase: 124 U/L (ref 38–126)
Anion gap: 8 (ref 5–15)
BUN: 19 mg/dL (ref 8–23)
CO2: 29 mmol/L (ref 22–32)
Calcium: 10.3 mg/dL (ref 8.9–10.3)
Chloride: 102 mmol/L (ref 98–111)
Creatinine: 0.97 mg/dL (ref 0.44–1.00)
GFR, Estimated: >60 mL/min (ref >60)
Glucose, Bld: 218 mg/dL — ABNORMAL HIGH (ref 70–99)
Potassium: 4 mmol/L (ref 3.5–5.1)
Sodium: 139 mmol/L (ref 135–145)
Total Bilirubin: 0.4 mg/dL (ref 0.3–1.2)
Total Protein: 7.5 g/dL (ref 6.5–8.1)

## 2022-01-22 MED ORDER — DIPHENHYDRAMINE HCL 50 MG/ML IJ SOLN
25.0000 mg | Freq: Once | INTRAMUSCULAR | Status: AC
  Administered 2022-01-22: 25 mg via INTRAVENOUS
  Filled 2022-01-22: qty 1

## 2022-01-22 MED ORDER — SODIUM CHLORIDE 0.9 % IV SOLN
607.5000 mg | Freq: Once | INTRAVENOUS | Status: AC
  Administered 2022-01-22: 610 mg via INTRAVENOUS
  Filled 2022-01-22: qty 61

## 2022-01-22 MED ORDER — SODIUM CHLORIDE 0.9 % IV SOLN
105.0000 mg/m2 | Freq: Once | INTRAVENOUS | Status: AC
  Administered 2022-01-22: 222 mg via INTRAVENOUS
  Filled 2022-01-22: qty 37

## 2022-01-22 MED ORDER — ACETAMINOPHEN 325 MG PO TABS
650.0000 mg | ORAL_TABLET | Freq: Once | ORAL | Status: AC
  Administered 2022-01-22: 650 mg via ORAL
  Filled 2022-01-22: qty 2

## 2022-01-22 MED ORDER — OXYCODONE HCL 5 MG PO TABS
5.0000 mg | ORAL_TABLET | Freq: Four times a day (QID) | ORAL | 0 refills | Status: DC | PRN
  Filled 2022-01-22: qty 30, 8d supply, fill #0

## 2022-01-22 MED ORDER — TRASTUZUMAB-DKST CHEMO 150 MG IV SOLR
6.0000 mg/kg | Freq: Once | INTRAVENOUS | Status: AC
  Administered 2022-01-22: 609 mg via INTRAVENOUS
  Filled 2022-01-22: qty 29

## 2022-01-22 MED ORDER — SODIUM CHLORIDE 0.9% FLUSH
10.0000 mL | Freq: Once | INTRAVENOUS | Status: AC
  Administered 2022-01-22: 10 mL

## 2022-01-22 MED ORDER — SODIUM CHLORIDE 0.9% FLUSH: 10.0000 mL | INTRAVENOUS | Status: DC | PRN

## 2022-01-22 MED ORDER — SODIUM CHLORIDE 0.9 % IV SOLN
150.0000 mg | Freq: Once | INTRAVENOUS | Status: AC
  Administered 2022-01-22: 150 mg via INTRAVENOUS
  Filled 2022-01-22: qty 150

## 2022-01-22 MED ORDER — HEPARIN SOD (PORK) LOCK FLUSH 100 UNIT/ML IV SOLN: 500.0000 [IU] | Freq: Once | INTRAVENOUS | Status: DC | PRN

## 2022-01-22 MED ORDER — SODIUM CHLORIDE 0.9 % IV SOLN
10.0000 mg | Freq: Once | INTRAVENOUS | Status: AC
  Administered 2022-01-22: 10 mg via INTRAVENOUS
  Filled 2022-01-22: qty 10

## 2022-01-22 MED ORDER — SODIUM CHLORIDE 0.9 % IV SOLN: Freq: Once | INTRAVENOUS | Status: AC

## 2022-01-22 MED ORDER — PALONOSETRON HCL INJECTION 0.25 MG/5ML
0.2500 mg | Freq: Once | INTRAVENOUS | Status: AC
  Administered 2022-01-22: 0.25 mg via INTRAVENOUS
  Filled 2022-01-22: qty 5

## 2022-01-22 MED ORDER — FAMOTIDINE IN NACL 20-0.9 MG/50ML-% IV SOLN
20.0000 mg | Freq: Once | INTRAVENOUS | Status: AC
  Administered 2022-01-22: 20 mg via INTRAVENOUS
  Filled 2022-01-22: qty 50

## 2022-01-22 NOTE — Assessment & Plan Note (Signed)
Her blood pressure is profoundly elevated likely due to pain We will proceed with treatment without delay

## 2022-01-22 NOTE — Assessment & Plan Note (Signed)
She has no clinical signs of congestive heart failure We discussed importance of close monitoring of blood pressure

## 2022-01-22 NOTE — Assessment & Plan Note (Signed)
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

## 2022-01-22 NOTE — Assessment & Plan Note (Signed)
I will reduce the dose of Taxol further

## 2022-01-22 NOTE — Assessment & Plan Note (Signed)
She has significant knee pain This likely exacerbate her blood pressure I will prescribe a small amount of pain medicine today I did warn her about risk of constipation

## 2022-01-22 NOTE — Assessment & Plan Note (Signed)
She has significant fluctuation of her renal function I will adjust the dose of chemotherapy accordingly

## 2022-01-22 NOTE — Assessment & Plan Note (Signed)
This is due to recent treatment Observe closely

## 2022-01-22 NOTE — Patient Instructions (Addendum)
[facility]  Discharge Instructions: Thank you for choosing [facility] to provide your oncology and hematology care.   If you have a lab appointment with the [facility], please go directly to the [facility] and check in at the registration area.   Wear comfortable clothing and clothing appropriate for easy access to any Portacath or PICC line.   We strive to give you quality time with your provider. You may need to reschedule your appointment if you arrive late (15 or more minutes).  Arriving late affects you and other patients whose appointments are after yours.  Also, if you miss three or more appointments without notifying the office, you may be dismissed from the clinic at the providers discretion.      For prescription refill requests, have your pharmacy contact our office and allow 72 hours for refills to be completed.    Today you received the following chemotherapy and/or immunotherapy agents: Ogivri, Taxol, & Carboplatin   To help prevent nausea and vomiting after your treatment, we encourage you to take your nausea medication as directed.  BELOW ARE SYMPTOMS THAT SHOULD BE REPORTED IMMEDIATELY: *FEVER GREATER THAN 100.4 F (38 C) OR HIGHER *CHILLS OR SWEATING *NAUSEA AND VOMITING THAT IS NOT CONTROLLED WITH YOUR NAUSEA MEDICATION *UNUSUAL SHORTNESS OF BREATH *UNUSUAL BRUISING OR BLEEDING *URINARY PROBLEMS (pain or burning when urinating, or frequent urination) *BOWEL PROBLEMS (unusual diarrhea, constipation, pain near the anus) TENDERNESS IN MOUTH AND THROAT WITH OR WITHOUT PRESENCE OF ULCERS (sore throat, sores in mouth, or a toothache) UNUSUAL RASH, SWELLING OR PAIN  UNUSUAL VAGINAL DISCHARGE OR ITCHING   Items with * indicate a potential emergency and should be followed up as soon as possible or go to the Emergency Department if any problems should occur.  Please show the CHEMOTHERAPY ALERT CARD or IMMUNOTHERAPY ALERT CARD  at check-in to the Emergency Department and triage nurse.  Should you have questions after your visit or need to cancel or reschedule your appointment, please contact [facility]  Dept: [phone]  and follow the prompts.  Office hours are 8:00 a.m. to 4:30 p.m. Monday - Friday. Please note that voicemails left after 4:00 p.m. may not be returned until the following business day.  We are closed weekends and major holidays. You have access to a nurse at all times for urgent questions. Please call the main number to the clinic Dept: [phone] and follow the prompts.   For any non-urgent questions, you may also contact your provider using MyChart. We now offer e-Visits for anyone 72 and older to request care online for non-urgent symptoms. For details visit [email or web address].   Also download the MyChart app! Go to the app store, search "MyChart", open the app, select [facility], and log in with your MyChart username and password.  Due to Covid, a mask is required upon entering the hospital/clinic. If you do not have a mask, one will be given to you upon arrival. For doctor visits, patients may have 1 support person aged 36 or older with them. For treatment visits, patients cannot have anyone with them due to current Covid guidelines and our immunocompromised population.

## 2022-01-22 NOTE — Progress Notes (Signed)
[place] OFFICE PROGRESS NOTE  Patient Care Team: [name], MD as PCP - General (Family Medicine) Croitoru, Dani Gobble, MD as PCP - Cardiology (Cardiology)  ASSESSMENT & PLAN:  Papillary serous adenocarcinoma of endometrium [facility]) She has lost a lot of weight since recent hospitalization She is also had significant neuropathy from prior treatment I plan to reduce the dose of Taxol and to adjust the chemotherapy based on her most current weight I recommend minimum 4 cycles of treatment before repeating imaging study  Pancytopenia, acquired [facility]) This is due to recent treatment Observe closely  CKD (chronic kidney disease), stage III ([place]) She has significant fluctuation of her renal function I will adjust the dose of chemotherapy accordingly  Chronic diastolic heart failure ([place]) She has no clinical signs of congestive heart failure We discussed importance of close monitoring of blood pressure  Hypertension Her blood pressure is profoundly elevated likely due to pain We will proceed with treatment without delay  Joint pain, knee She has significant knee pain This likely exacerbate her blood pressure I will prescribe a small amount of pain medicine today I did warn her about risk of constipation  Peripheral neuropathy due to chemotherapy ([place]) I will reduce the dose of Taxol further  No orders of the defined types were placed in this encounter.   All questions were answered. The patient knows to call the clinic with any problems, questions or concerns. The total time spent in the appointment was 40 minutes encounter with patients including review of chart and various tests results, discussions about plan of care and coordination of care plan   Heath Lark, MD 01/22/2022 9:04 AM  INTERVAL HISTORY: Please see below for problem oriented charting. she returns for treatment follow-up seen prior to cycle 2 of carboplatin, paclitaxel and trastuzumab for  recurrent metastatic uterine cancer She was recently hospitalized for pancreatitis She continues to have intermittent abdominal pain as well as loose stool She has lost a lot of weight She complained of severe knee pain today She complains of peripheral neuropathy especially on her feet  REVIEW OF SYSTEMS:   Constitutional: Denies fevers, chills Eyes: Denies blurriness of vision Ears, nose, mouth, throat, and face: Denies mucositis or sore throat Respiratory: Denies cough, dyspnea or wheezes Cardiovascular: Denies palpitation, chest discomfort or lower extremity swelling Skin: Denies abnormal skin rashes Lymphatics: Denies new lymphadenopathy or easy bruising Behavioral/Psych: Mood is stable, no new changes  All other systems were reviewed with the patient and are negative.  I have reviewed the past medical history, past surgical history, social history and family history with the patient and they are unchanged from previous note.  ALLERGIES:  has No Known Allergies.  MEDICATIONS:  Current Outpatient Medications  Medication Sig Dispense Refill   oxyCODONE (OXY IR/ROXICODONE) 5 MG immediate release tablet Take 1 tablet by mouth every 6 hours as needed for severe pain. 30 tablet 0   aspirin 81 MG EC tablet Take 1 tablet (81 mg total) by mouth daily. 30 tablet 12   atorvastatin (LIPITOR) 80 MG tablet TAKE 1 TABLET (80 MG TOTAL) BY MOUTH AT BEDTIME. 30 tablet 6   dexamethasone (DECADRON) 4 MG tablet Take 2 tablets by mouth the night before and 2 tablets the morning of chemotherapy, every 3 weeks x 6 cycles 36 tablet 6   Dulaglutide (TRULICITY) 1.5 JE/5.6DJ SOPN Inject 1.5 mg into the skin once a week. (Patient taking differently: Inject 1.5 mg into the skin once a week. Friday/Saturday) 2 mL  2   furosemide (LASIX) 20 MG tablet Take 2 tablets (40 mg total) by mouth daily. 60 tablet 2   glimepiride (AMARYL) 4 MG tablet Take 1 tablet (4 mg total) by mouth daily with breakfast. 30 tablet 6    isosorbide mononitrate (IMDUR) 30 MG 24 hr tablet Take 2 tablets (60 mg total) by mouth daily. 60 tablet 2   lisinopril (ZESTRIL) 20 MG tablet TAKE 1 TABLET BY MOUTH ONCE A DAY 30 tablet 2   metFORMIN (GLUCOPHAGE) 500 MG tablet Take 2 tablets (1,000 mg total) by mouth 2 (two) times daily with a meal. 120 tablet 2   metoprolol tartrate (LOPRESSOR) 100 MG tablet TAKE 1 TABLET (100 MG TOTAL) BY MOUTH 2 (TWO) TIMES DAILY. 60 tablet 2   nitroGLYCERIN (NITROSTAT) 0.4 MG SL tablet Place 1 tablet (0.4 mg total) under the tongue every 5 (five) minutes as needed for chest pain. (Patient not taking: Reported on 01/08/2022) 25 tablet 2   ondansetron (ZOFRAN) 8 MG tablet Take 1 tablet by mouth 2  times daily as needed for refractory nausea / vomiting. Start on day 3 after carboplatin chemo. 30 tablet 1   pantoprazole (PROTONIX) 40 MG tablet Take 1 tablet (40 mg total) by mouth daily. 30 tablet 0   prochlorperazine (COMPAZINE) 10 MG tablet Take 1 tablet by mouth every 6  hours as needed (Nausea or vomiting). 30 tablet 1   No current facility-administered medications for this visit.    SUMMARY OF ONCOLOGIC HISTORY: Oncology History Overview Note  Pap 12/14/20: adenocarcinoma, HPV negative  HER-2 positive by FISH Her-2 equivocal by [facility]  MSI stable    Papillary serous adenocarcinoma of endometrium ([place])   Initial Diagnosis   Endometrial carcinoma ([place])   01/12/2021 Initial Biopsy   A. ENDOCERVIX, CURETTAGE:  - Adenocarcinoma.  B. ENDOMETRIUM, BIOPSY:  - Adenocarcinoma.  COMMENT:  The differential diagnosis includes high grade serous carcinoma.  Both  specimens have a similar morphology; high grade serous carcinoma more  commonly arises in the endometrium.  Results reported to Dr. Hale Bogus  on 01/15/2021.  Dr. Saralyn Pilar reviewed the case.    01/16/2021 Tumor Marker   Patient's tumor was tested for the following markers: CA-125 Results of the tumor marker test revealed normal value, 10.7    01/25/2021 Imaging   CT C/A/P: 1. Small volume fluid in the endometrial canal with 7 mm hypoattenuating lesion in the myometrium of the anterior fundus. 2. No definite evidence for metastatic disease in the chest, abdomen, or pelvis. Small lymph nodes are noted along both pelvic sidewalls. Attention on follow-up recommended. 3. 5 mm ground-glass opacity peripheral left upper lobe. This is likely related to infection/inflammation and potentially scar. Attention on surveillance imaging recommended. 4. 5.8 cm lesion posterior lower uterine segment likely a fibroid. Ultrasound exam from 01/28/2010 demonstrated a 5.2 cm lesion in the left aspect of the posterior lower uterine body. 5. Aortic Atherosclerosis (ICD10-I70.0).   01/29/2021 Surgery   TRH/BSO, SLN biopsy left, selective pelvic LND right, omentectomy  Findings: On EUA, 10-12cm enlarged mobile uterus. On intra-abdomina entry, minimal adhesions between the liver and anterior abdomen on the right. Some changes c/w fatty liver on the left. Omentum, stomach, small and large bowel all grossly normal. Bilateral ovaries normal appearing. Uterus with 5-6cm posterior fibroid, otherwise normal appearing. No mapping to right pelvis. On left, mapping to the level of the superior vessel artery, enlarged lymph node noted (likely sentinel) within the upper aspect of the obturator space. In bilateral  pelvic basins, multiple enlarged lymph nodes. On the right, enlarged lymph nodes extended superiorly along the common iliac vessels. At the end of surgery, no obvious abdominal or pelvic evidence of disease.   01/29/2021 Pathology Results   A. SENTINEL LYMPH NODE, LEFT INTERNAL ILIAC, BIOPSY:  - Metastatic carcinoma in (1) of (1) lymph node.   B. SENTINEL LYMPH NODE, LEFT OBTURATOR, BIOPSY:  - Metastatic carcinoma in (1) of (1) lymph node.   C. LYMPH NODES, LEFT PELVIC, DISSECTION:  - Metastatic carcinoma in (1) of (3) lymph nodes.   D. LYMPH NODE,  RIGHT EXTERNAL AND COMMON ILIAC, BIOPSY:  - Metastatic carcinoma in (1) of (1) lymph node.   E. UTERUS, CERVIX AND BILATERAL FALLOPIAN TUBES AND OVARIES, TOTAL  HYSTERECTOMY AND BILATERAL SALPINGO-OOPHORECTOMY:  - High grade serous carcinoma of endometrium, with invasion more than  half of the myometrium.  - Tumor invades the stromal connective tissue of the cervix.  - No involvement of uterine serosa or adnexa.  - Lymphovascular invasion is identified.  - See oncology table.   F. OMENTUM, OMENTECTOMY:  - Omentum, negative for carcinoma.   G. LYMPH NODES, RIGHT PELVIC, DISSECTION:  - Two lymph nodes, negative for carcinoma (0/2).   ONCOLOGY TABLE:   UTERUS, CARCINOMA OR CARCINOSARCOMA: Resection   Procedure: Total hysterectomy and bilateral salpingo-oophorectomy,  Omentectomy, Lymph node sampling  Histologic Type: Serous carcinoma  Histologic Grade: High grade  Myometrial Invasion: > 50%  Uterine Serosa Involvement: Not identified  Cervical Stroma Involvement: Present  Other Tissue/Organ Involvement: Not identified  Peritoneal/Ascitic Fluid: Negative for carcinoma  Lymphovascular Invasion: Present  Regional Lymph Nodes:       Pelvic Lymph Nodes Examined:            2 Sentinel            6 Non-Sentinel            8 Total       Pelvic Lymph Nodes with Metastasis: 4            Macrometastasis: 3            Micrometastasis: 1            Isolated Tumor Cells: 0            Laterality of Lymph Nodes with Tumor: Right (non-sentinel),  Left (sentinel and non-sentinel)            Extracapsular Extension: Present       Para-Aortic Lymph Nodes Examined: Not applicable        Para-Aortic Lymph Nodes with Metastasis: Not applicable  Distant Metastasis:       Distant Site(s) Involved: Omentum: Not involved  Pathologic Stage Classification (pTNM, AJCC 8th Edition): pT2, pN1a  Ancillary Studies: HER2 will be ordered  Additional Findings: Leiomyomata.  Adenomyosis.  Representative  Tumor Block: B1  Comment: Dr. Saralyn Pilar reviewed select slides.  (v4.2.0.1)    01/29/2021 Cancer Staging   Staging form: Corpus Uteri - Carcinoma and Carcinosarcoma, AJCC 8th Edition - Clinical stage from 01/29/2021: FIGO Stage IIIC1 (cT1b, cN1a, cM0) - Signed by Lafonda Mosses, MD on 02/02/2021 Histopathologic type: Mixed cell adenocarcinoma Stage prefix: Initial diagnosis Method of lymph node assessment: Other Histologic grade (G): G3 Histologic grading system: 3 grade system Lymph-vascular invasion (LVI): LVI present/identified, NOS Peritoneal cytology results: Negative Pelvic nodal status: Positive Number of pelvic nodes positive from dissection: 4 Number of pelvic nodes examined during dissection: 8 Para-aortic status: Not assessed Lymph node metastasis: Present  Omentectomy performed: Yes Morcellation performed: No    02/19/2021 Echocardiogram    1. Left ventricular ejection fraction, by estimation, is 55 to 60%. The left ventricle has normal function. The left ventricle demonstrates regional wall motion abnormalities (see scoring diagram/findings for description). There is mild left ventricular  hypertrophy. Left ventricular diastolic parameters are consistent with Grade I diastolic dysfunction (impaired relaxation). There is moderate hypokinesis of the left ventricular, basal septal wall and inferior wall. The average left ventricular global longitudinal strain is -17.9 %. The global longitudinal strain is abnormal.  2. Right ventricular systolic function is normal. The right ventricular size is normal. There is normal pulmonary artery systolic pressure. The estimated right ventricular systolic pressure is 69.4 mmHg.  3. The mitral valve is abnormal. Trivial mitral valve regurgitation.  4. The aortic valve is tricuspid. Aortic valve regurgitation is not visualized.  5. The inferior vena cava is normal in size with greater than 50% respiratory variability, suggesting right atrial  pressure of 3 mmHg.   02/20/2021 Procedure   Successful placement of a right IJ approach Power Port with ultrasound and fluoroscopic guidance. The catheter is ready for use.   02/28/2021 - 06/14/2021 Chemotherapy    Patient is on Treatment Plan: UTERINE CARBOPLATIN AUC 6 / PACLITAXEL Q21D       04/20/2021 Imaging   Status post hysterectomy and suspected bilateral salpingo-oophorectomy.   2.2 cm fluid density lesion along the left pelvic sidewall likely reflects a postoperative seroma.   No findings suspicious for recurrent or metastatic disease.   07/16/2021 - 08/16/2021 Radiation Therapy   Radiation Treatment Dates: 07/16/2021 through 08/16/2021 Site Technique Total Dose (Gy) Dose per Fx (Gy) Completed Fx Beam Energies  Vagina: Pelvis HDR-brachy 30/30 6 5/5 Ir-192        10/01/2021 Imaging   Increasing size of LEFT pelvic lymph nodes, largest approximately 11 mm along the external iliac chain. Given findings and history could consider PET for further evaluation as warranted.   Cystic area along the LEFT pelvic sidewall that was seen previously has improved.   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   Aortic Atherosclerosis (ICD10-I70.0).   12/21/2021 Imaging   1. Enlarging soft tissue masses involving the vaginal cuff. 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 Speculum examination and re-biopsy may be confirmatory. PET-CT may be helpful. 2. New small sub 5 mm perirectal and sigmoid mesocolon nodes. 3. Slight progression of pelvic adenopathy as detailed above. 4. No findings suspicious for abdominal metastatic disease or osseous metastatic disease. 5. Stable age advanced atherosclerotic calcifications involving the aorta and branch vessels and coronary arteries   12/25/2021 Echocardiogram    1. Global longitudinal strain is -16.1% Mild basal inferior hypoinesis.  Compared to echo from 02/2021, no change in overall LVEF /regional wall motion. Global longitudinal strain is mildly less negative (-17.9% to -16.1%. Left ventricular ejection fraction, by estimation, is 65 to 70%. The left ventricle has normal function. The left ventricle has no regional wall motion abnormalities. There is mild left ventricular hypertrophy. Left ventricular diastolic parameters are indeterminate.  2. Right ventricular systolic function is normal. The right ventricular size is normal. There is normal pulmonary artery systolic pressure.  3. Trivial mitral valve regurgitation.  4. The aortic valve is normal in structure. Aortic valve regurgitation is not visualized.  5. The inferior vena cava is normal in size with greater than 50% respiratory variability, suggesting  right atrial pressure of 3 mmHg.     01/01/2022 -  Chemotherapy   Patient is on Treatment Plan : UTERINE SEROUS CARCINOMA Carboplatin + Paclitaxel + Trastuzumab q21d x 6 Cycles / Trastuzumab q21d       PHYSICAL EXAMINATION: ECOG PERFORMANCE STATUS: 1 - Symptomatic but completely ambulatory  Vitals:   01/22/22 0838  BP: (!) 166/72  Pulse: (!) 115  Resp: 18  Temp: 97.7 F (36.5 C)  SpO2: 100%   Filed Weights   01/22/22 0838  Weight: 227 lb (103 kg)    GENERAL:alert, no distress and comfortable NEURO: alert & oriented x 3 with fluent speech, no focal motor/sensory deficits  LABORATORY DATA:  I have reviewed the data as listed    Component Value Date/Time   NA 139 01/22/2022 0808   NA 143 10/18/2020 1028   K 4.0 01/22/2022 0808   CL 102 01/22/2022 0808   CO2 29 01/22/2022 0808   GLUCOSE 218 (H) 01/22/2022 0808   BUN 19 01/22/2022 0808   BUN 13 10/18/2020 1028   CREATININE 0.97 01/22/2022 0808   CREATININE 0.79 02/11/2017 0943   CALCIUM 10.3 01/22/2022 0808   PROT 7.5 01/22/2022 0808   PROT 6.7 10/18/2020 1028   ALBUMIN 3.8 01/22/2022 0808   ALBUMIN 4.1 10/18/2020 1028   AST 15 01/22/2022 0808    ALT 22 01/22/2022 0808   ALKPHOS 124 01/22/2022 0808   BILITOT 0.4 01/22/2022 0808   GFRNONAA >60 01/22/2022 0808   GFRNONAA 83 02/11/2017 0943   GFRAA 70 10/18/2020 1028   GFRAA >89 02/11/2017 0943    No results found for: SPEP, UPEP  Lab Results  Component Value Date   WBC 4.4 01/22/2022   NEUTROABS 2.5 01/22/2022   HGB 10.0 (L) 01/22/2022   HCT 30.7 (L) 01/22/2022   MCV 88.5 01/22/2022   PLT 134 (L) 01/22/2022      Chemistry      Component Value Date/Time   NA 139 01/22/2022 0808   NA 143 10/18/2020 1028   K 4.0 01/22/2022 0808   CL 102 01/22/2022 0808   CO2 29 01/22/2022 0808   BUN 19 01/22/2022 0808   BUN 13 10/18/2020 1028   CREATININE 0.97 01/22/2022 0808   CREATININE 0.79 02/11/2017 0943      Component Value Date/Time   CALCIUM 10.3 01/22/2022 0808   ALKPHOS 124 01/22/2022 0808   AST 15 01/22/2022 0808   ALT 22 01/22/2022 0808   BILITOT 0.4 01/22/2022 0808       RADIOGRAPHIC STUDIES: I have personally reviewed the radiological images as listed and agreed with the findings in the report. CT ABDOMEN PELVIS W CONTRAST  Result Date: 01/08/2022 CLINICAL DATA:  Abdominal pain. History of uterine/cervical cancer status post prior hysterectomy. EXAM: CT ABDOMEN AND PELVIS WITH CONTRAST TECHNIQUE: Multidetector CT imaging of the abdomen and pelvis was performed using the standard protocol following bolus administration of intravenous contrast. RADIATION DOSE REDUCTION: This exam was performed according to the departmental dose-optimization program which includes automated exposure control, adjustment of the mA and/or kV according to patient size and/or use of iterative reconstruction technique. CONTRAST:  178m OMNIPAQUE IOHEXOL 300 MG/ML  SOLN COMPARISON:  CT abdomen pelvis dated 12/21/2021. FINDINGS: Lower chest: The visualized lung bases are clear. There is coronary vascular calcification. No intra-abdominal free air or free fluid. Hepatobiliary: The liver is  unremarkable. No intrahepatic biliary dilatation. The gallbladder is unremarkable. Pancreas: There is mild haziness adjacent to the head and uncinate process of  the pancreas, likely related to inflammatory changes of the duodenum. Acute pancreatitis is not excluded correlation with pancreatic enzymes recommended. No drainable fluid collection/abscess or pseudocyst. Spleen: Normal in size without focal abnormality. Adrenals/Urinary Tract: The adrenal glands unremarkable. The kidneys, visualized ureters, and urinary bladder appear unremarkable. Stomach/Bowel: There is sigmoid diverticulosis without active inflammatory changes. There is inflammatory changes of the duodenal C-loop which may represent duodenitis. There is no bowel obstruction. The appendix is normal. Vascular/Lymphatic: Moderate aortoiliac atherosclerotic disease. The IVC is unremarkable. No portal venous gas. Slightly enlarged appearance of a mildly enlarged left pelvic sidewall lymph node measuring 14 mm short axis (66/2). Mildly enlarged lymph node medial to the left psoas measures 12 mm short axis. Reproductive: Hysterectomy. Grossly similar or slightly enlarged ovoid mass in the left pelvis measuring 3.7 x 2.1 cm (previously 3.1 x 2.5 cm) associated with left vaginal fornix. Similar appearance of the right vaginal fornix with soft tissue nodularity. Several small mildly rounded perirectal lymph nodes again noted Other: None Musculoskeletal: Degenerative changes of the spine. No acute osseous pathology. IMPRESSION: 1. Findings may represent duodenitis versus pancreatitis. Clinical correlation is recommended. 2. Sigmoid diverticulosis. No bowel obstruction. Normal appendix. 3. Slightly enlarged soft tissue masses associated with vaginal cuff as well as increase in the size of the retroperitoneal adenopathy compared to the prior CT. 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 Clinical correlation is recommended. 4. Aortic Atherosclerosis (ICD10-I70.0).  Electronically Signed   By: Anner Crete M.D.   On: 01/08/2022 00:20   DG Chest Port 1 View  Result Date: 01/07/2022 CLINICAL DATA:  Questionable sepsis - evaluate for abnormality Nausea and weakness. EXAM: PORTABLE CHEST 1 VIEW COMPARISON:  07/09/2021 FINDINGS: Right chest port in place. Lung volumes are low. Mild vascular congestion without pulmonary edema. The heart is normal in size. Coronary stent. No focal airspace disease. No pleural effusion or pneumothorax. No acute osseous abnormalities are seen. IMPRESSION: Low lung volumes with mild vascular congestion. Electronically Signed   By: Keith Rake M.D.   On: 01/07/2022 23:48   ECHOCARDIOGRAM COMPLETE  Result Date: 12/25/2021    ECHOCARDIOGRAM REPORT   Patient Name:   ANIELA CANIGLIA Date of Exam: 12/25/2021 Medical Rec #:  [id number]     Height:       62.0 in Accession #:    [id number]    Weight:       232.6 lb Date of Birth:  1956-01-19    BSA:          2.038 m Patient Age:    7 years      BP:           132/79 mmHg Patient Gender: F             HR:           55 bpm. Exam Location:  Outpatient Procedure: Strain Analysis, 3D Echo and 2D Echo Indications:    Chemo.  History:        Patient has prior history of Echocardiogram examinations, most                 recent 02/19/2021. CAD, chronic kidney disease; Risk                 Factors:Diabetes, Dyslipidemia and Hypertension.  Sonographer:    [place] Referring Phys: [phone] Annie Saephan [place]  1. Global longitudinal strain is -16.1% Mild basal inferior hypoinesis     Compared to echo from 02/2021, no change in overall LVEF /  regional wall motion. Global longitudinal strain is mildly less negative (-17.9% to -16.1%. Left ventricular ejection fraction, by estimation, is 65 to 70%. The left ventricle has normal function. The left ventricle has no regional wall motion abnormalities. There is mild left ventricular hypertrophy. Left ventricular diastolic parameters are indeterminate.  2.  Right ventricular systolic function is normal. The right ventricular size is normal. There is normal pulmonary artery systolic pressure.  3. Trivial mitral valve regurgitation.  4. The aortic valve is normal in structure. Aortic valve regurgitation is not visualized.  5. The inferior vena cava is normal in size with greater than 50% respiratory variability, suggesting right atrial pressure of 3 mmHg. FINDINGS  Left Ventricle: Global longitudinal strain is -16.1% Mild basal inferior hypoinesis Compared to echo from 02/2021, no change in overall LVEF /regional wall motion. Global longitudinal strain is mildly less negative (-17.9% to -16.1%. Left ventricular ejection fraction, by estimation, is 65 to 70%. The left ventricle has normal function. The left ventricle has no regional wall motion abnormalities. The left ventricular internal cavity size was normal in size. There is mild left ventricular hypertrophy. Left ventricular diastolic parameters are indeterminate. Right Ventricle: The right ventricular size is normal. Right vetricular wall thickness was not assessed. Right ventricular systolic function is normal. There is normal pulmonary artery systolic pressure. The tricuspid regurgitant velocity is 2.82 m/s, and with an assumed right atrial pressure of 3 mmHg, the estimated right ventricular systolic pressure is 34.7 mmHg. Left Atrium: Left atrial size was normal in size. Right Atrium: Right atrial size was normal in size. Pericardium: There is no evidence of pericardial effusion. Mitral Valve: Mild mitral annular calcification. Trivial mitral valve regurgitation. Tricuspid Valve: The tricuspid valve is normal in structure. Tricuspid valve regurgitation is trivial. Aortic Valve: The aortic valve is normal in structure. Aortic valve regurgitation is not visualized. Pulmonic Valve: The pulmonic valve was not well visualized. Pulmonic valve regurgitation is trivial. No evidence of pulmonic stenosis. Aorta: The aortic  root is normal in size and structure. Venous: The inferior vena cava is normal in size with greater than 50% respiratory variability, suggesting right atrial pressure of 3 mmHg. IAS/Shunts: No atrial level shunt detected by color flow Doppler.  LEFT VENTRICLE PLAX 2D LVIDd:         4.70 cm   Diastology LVIDs:         3.10 cm   LV e' medial:    4.35 cm/s LV PW:         1.20 cm   LV E/e' medial:  17.3 LV IVS:        0.90 cm   LV e' lateral:   5.98 cm/s LVOT diam:     1.80 cm   LV E/e' lateral: 12.6 LV SV:         68 LV SV Index:   33 LVOT Area:     2.54 cm                           3D Volume EF:                          3D EF:        62 %                          LV EDV:       97 ml  LV ESV:       37 ml                          LV SV:        61 ml RIGHT VENTRICLE             IVC RV S prime:     12.30 cm/s  IVC diam: 1.50 cm TAPSE (M-mode): 2.0 cm LEFT ATRIUM             Index        RIGHT ATRIUM           Index LA diam:        3.90 cm 1.91 cm/m   RA Area:     11.50 cm LA Vol (A2C):   57.6 ml 28.26 ml/m  RA Volume:   23.90 ml  11.72 ml/m LA Vol (A4C):   64.1 ml 31.45 ml/m LA Biplane Vol: 63.9 ml 31.35 ml/m  AORTIC VALVE LVOT Vmax:   108.00 cm/s LVOT Vmean:  71.000 cm/s LVOT VTI:    0.266 m  AORTA Ao Root diam: 2.90 cm MITRAL VALVE               TRICUSPID VALVE MV Area (PHT): 2.17 cm    TR Peak grad:   31.8 mmHg MV Decel Time: 349 msec    TR Vmax:        282.00 cm/s MV E velocity: 75.10 cm/s MV A velocity: 98.10 cm/s  SHUNTS MV E/A ratio:  0.77        Systemic VTI:  0.27 m                            Systemic Diam: 1.80 cm Dorris Carnes MD Electronically signed by Dorris Carnes MD Signature Date/Time: 12/25/2021/1:02:21 PM    Final

## 2022-01-24 ENCOUNTER — Other Ambulatory Visit: Payer: Self-pay

## 2022-01-24 ENCOUNTER — Other Ambulatory Visit (HOSPITAL_COMMUNITY): Payer: Self-pay

## 2022-01-24 ENCOUNTER — Telehealth: Payer: Self-pay

## 2022-01-24 MED ORDER — PANTOPRAZOLE SODIUM 40 MG PO TBEC
40.0000 mg | DELAYED_RELEASE_TABLET | Freq: Every day | ORAL | 1 refills | Status: DC
  Filled 2022-01-24 – 2022-03-24 (×3): qty 30, 30d supply, fill #0

## 2022-01-24 NOTE — Telephone Encounter (Signed)
Called and given below message to [place]. She verbalized understanding.  [facility] does have anymore Protonix Rx. [name] thinks that would help with indigestion. Okay to send refill to [facility]?

## 2022-01-24 NOTE — Telephone Encounter (Signed)
Called and told [name] Rx sent to pharmacy for Protonix. She verbalized understanding.

## 2022-01-24 NOTE — Telephone Encounter (Signed)
She can have Glucerna if needed Sometimes I suggest the patient to take a trip to the grocery store or watch food channels to get ideas about food that appeals to them

## 2022-01-24 NOTE — Telephone Encounter (Signed)
-----   Message from Heath Lark, MD sent at 01/24/2022  9:17 AM EST ----- Can you call if her pain is better? How many pain medicine did she take? I only sent a small prescription if she is using 4 per day we might not have enough to last through the weekend

## 2022-01-24 NOTE — Telephone Encounter (Signed)
Called and given below message to daughter. She verbalized understanding and is with her mom now. Her mom has 26 oxycodone left. She just took a pain pill. The pain gets a little better after taking oxycodone. Pain was worse during the night. She is taking tum's prn for possible indigestion. She is having pain under both breasts/ across chest area. She is having a hard time eating due to lack of appetite. Daughter is asking if it is okay to take Glucerna shakes? Or what do you suggest?

## 2022-01-24 NOTE — Telephone Encounter (Signed)
Yes pls

## 2022-01-28 ENCOUNTER — Telehealth: Payer: Self-pay

## 2022-01-28 ENCOUNTER — Encounter (HOSPITAL_COMMUNITY): Payer: Self-pay

## 2022-01-28 ENCOUNTER — Emergency Department (HOSPITAL_COMMUNITY): Payer: Medicaid Other

## 2022-01-28 ENCOUNTER — Emergency Department (HOSPITAL_COMMUNITY)
Admission: EM | Admit: 2022-01-28 | Discharge: 2022-01-28 | Disposition: A | Discharge status: Home / Self Care | Payer: Medicaid Other | Attending: Emergency Medicine | Admitting: Emergency Medicine

## 2022-01-28 DIAGNOSIS — R1084 Generalized abdominal pain: Secondary | ICD-10-CM | POA: Insufficient documentation

## 2022-01-28 DIAGNOSIS — Z79899 Other long term (current) drug therapy: Secondary | ICD-10-CM | POA: Diagnosis not present

## 2022-01-28 DIAGNOSIS — R1012 Left upper quadrant pain: Secondary | ICD-10-CM | POA: Diagnosis not present

## 2022-01-28 DIAGNOSIS — R1011 Right upper quadrant pain: Secondary | ICD-10-CM | POA: Insufficient documentation

## 2022-01-28 DIAGNOSIS — C55 Malignant neoplasm of uterus, part unspecified: Secondary | ICD-10-CM | POA: Insufficient documentation

## 2022-01-28 LAB — COMPREHENSIVE METABOLIC PANEL
ALT: 29 U/L (ref 0–44)
AST: 22 U/L (ref 15–41)
Albumin: 2.9 g/dL — ABNORMAL LOW (ref 3.5–5.0)
Alkaline Phosphatase: 91 U/L (ref 38–126)
Anion gap: 7 (ref 5–15)
BUN: 25 mg/dL — ABNORMAL HIGH (ref 8–23)
CO2: 23 mmol/L (ref 22–32)
Calcium: 7.9 mg/dL — ABNORMAL LOW (ref 8.9–10.3)
Chloride: 104 mmol/L (ref 98–111)
Creatinine, Ser: 1.16 mg/dL — ABNORMAL HIGH (ref 0.44–1.00)
GFR, Estimated: 53 mL/min — ABNORMAL LOW (ref >60)
Glucose, Bld: 233 mg/dL — ABNORMAL HIGH (ref 70–99)
Potassium: 4.4 mmol/L (ref 3.5–5.1)
Sodium: 134 mmol/L — ABNORMAL LOW (ref 135–145)
Total Bilirubin: 0.3 mg/dL (ref 0.3–1.2)
Total Protein: 6.2 g/dL — ABNORMAL LOW (ref 6.5–8.1)

## 2022-01-28 LAB — CBC WITH DIFFERENTIAL/PLATELET
Abs Immature Granulocytes: 0.01 10*3/uL (ref 0.00–0.07)
Basophils Absolute: 0 10*3/uL (ref 0.0–0.1)
Basophils Relative: 0 %
Eosinophils Absolute: 0 10*3/uL (ref 0.0–0.5)
Eosinophils Relative: 0 %
HCT: 30.5 % — ABNORMAL LOW (ref 36.0–46.0)
Hemoglobin: 10 g/dL — ABNORMAL LOW (ref 12.0–15.0)
Immature Granulocytes: 0 %
Lymphocytes Relative: 38 %
Lymphs Abs: 1.1 10*3/uL (ref 0.7–4.0)
MCH: 29.2 pg (ref 26.0–34.0)
MCHC: 32.8 g/dL (ref 30.0–36.0)
MCV: 89.2 fL (ref 80.0–100.0)
Monocytes Absolute: 0.2 10*3/uL (ref 0.1–1.0)
Monocytes Relative: 6 %
Neutro Abs: 1.6 10*3/uL — ABNORMAL LOW (ref 1.7–7.7)
Neutrophils Relative %: 56 %
Platelets: 126 10*3/uL — ABNORMAL LOW (ref 150–400)
RBC: 3.42 MIL/uL — ABNORMAL LOW (ref 3.87–5.11)
RDW: 14 % (ref 11.5–15.5)
WBC: 2.8 10*3/uL — ABNORMAL LOW (ref 4.0–10.5)
nRBC: 0 % (ref 0.0–0.2)

## 2022-01-28 LAB — LIPASE, BLOOD: Lipase: 39 U/L (ref 11–51)

## 2022-01-28 MED ORDER — FENTANYL CITRATE PF 50 MCG/ML IJ SOSY
100.0000 ug | PREFILLED_SYRINGE | Freq: Once | INTRAMUSCULAR | Status: AC
  Administered 2022-01-28: 100 ug via INTRAVENOUS
  Filled 2022-01-28: qty 2

## 2022-01-28 MED ORDER — SODIUM CHLORIDE 0.9 % IV SOLN: INTRAVENOUS | Status: DC

## 2022-01-28 MED ORDER — IOHEXOL 300 MG/ML  SOLN
100.0000 mL | Freq: Once | INTRAMUSCULAR | Status: AC | PRN
  Administered 2022-01-28: 100 mL via INTRAVENOUS

## 2022-01-28 MED ORDER — ONDANSETRON HCL 4 MG/2ML IJ SOLN
4.0000 mg | Freq: Once | INTRAMUSCULAR | Status: AC
  Administered 2022-01-28: 4 mg via INTRAVENOUS
  Filled 2022-01-28: qty 2

## 2022-01-28 NOTE — ED Triage Notes (Signed)
Pt arrived via [facility] from hotel(home).   Pt reports she has uterine cancer. Pt is on 2nd round of chemo. Pt states when she has chemo she gets epigastric/chest pain, shob, and n/v.   Last Chemo treatment last Tuesday and reports sx are worse.    A/Ox4   Pt seen here for same about 2 weeks ago.    Vs: HR-105 127/81  Pt given aspirin by ems   6/10 pain  Denies nausea at this time    20g in left ac.   CBG-300 Hx: DM2   Pt does trulicity every Friday.   Pt has not had any meds today.
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
## 2022-01-28 NOTE — Telephone Encounter (Signed)
Returned call to daughter. [name]'s pain is worse. Pain in mainly under her breasts, she is taking pain medication and asking for it every 3 hours. She is having nausea and vomiting.  [name] is concerned and calling 911 to take her to ED at [facility] and her family have all tested positive for covid. [name] has not tested for covid.

## 2022-01-28 NOTE — Discharge Instructions (Signed)
Testing today did not show any acute complications from your uterine cancer or chemotherapy.  There was no sign of another reason for your pain.  Use ibuprofen or acetaminophen for pain.  Follow-up with your PCP as soon as possible for further evaluation and treatment.

## 2022-01-29 ENCOUNTER — Telehealth: Payer: Self-pay

## 2022-01-29 NOTE — Telephone Encounter (Signed)
Called and given below message to daughter. She verbalized understanding. She will give her Mom clear liquids and pain medication.  She will try to get a covid test and test her Mom. She will call the office back if needed.
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
## 2022-01-29 NOTE — Telephone Encounter (Signed)
I do not have ER notes yet, looked at her labs and CT She has mild inflammation in her bowels similar to previous time, recommend clear liquid and bowel rest and pain management

## 2022-01-30 ENCOUNTER — Other Ambulatory Visit: Payer: Self-pay | Admitting: Hematology and Oncology

## 2022-01-30 ENCOUNTER — Telehealth: Payer: Self-pay | Admitting: Oncology

## 2022-01-30 NOTE — Telephone Encounter (Signed)
Called [name] to see how [name] is feeling.  She said [name] is no better or worse.  She continues to have pain and is taking oxycodone q 6 hours.  She tried to sip broth yesterday and ended up vomiting so now she is just drinking water.    Asked if she needs to be seen tomorrow by [name] at 11:20 and they would like to take the appointment.  Appointment has been scheduled.

## 2022-01-30 NOTE — Telephone Encounter (Signed)
Thanks for asking, no need labs I placed order for IVF and IV zofran

## 2022-01-30 NOTE — Telephone Encounter (Signed)
[place] and advised her of appointment for fluids at 12:30 after Dr. Calton Dach appointment tomorrow.

## 2022-01-30 NOTE — Telephone Encounter (Signed)
Can you try to get her appt for IVF afterwards? Thanks

## 2022-01-31 ENCOUNTER — Inpatient Hospital Stay: Payer: Medicaid Other

## 2022-01-31 ENCOUNTER — Inpatient Hospital Stay (HOSPITAL_BASED_OUTPATIENT_CLINIC_OR_DEPARTMENT_OTHER): Payer: Medicaid Other | Admitting: Hematology and Oncology

## 2022-01-31 ENCOUNTER — Other Ambulatory Visit: Payer: Self-pay

## 2022-01-31 ENCOUNTER — Other Ambulatory Visit: Payer: Self-pay | Admitting: Hematology and Oncology

## 2022-01-31 ENCOUNTER — Other Ambulatory Visit (HOSPITAL_COMMUNITY): Payer: Self-pay

## 2022-01-31 ENCOUNTER — Encounter: Payer: Self-pay | Admitting: Hematology and Oncology

## 2022-01-31 VITALS — BP 136/72 | HR 91 | Temp 98.1°F | Resp 17

## 2022-01-31 VITALS — BP 160/92 | HR 113 | Temp 99.1°F | Resp 18 | Ht 62.0 in | Wt 215.0 lb

## 2022-01-31 DIAGNOSIS — R1084 Generalized abdominal pain: Secondary | ICD-10-CM

## 2022-01-31 DIAGNOSIS — C541 Malignant neoplasm of endometrium: Secondary | ICD-10-CM

## 2022-01-31 DIAGNOSIS — N183 Chronic kidney disease, stage 3 unspecified: Secondary | ICD-10-CM

## 2022-01-31 DIAGNOSIS — Z7189 Other specified counseling: Secondary | ICD-10-CM | POA: Insufficient documentation

## 2022-01-31 DIAGNOSIS — Z95828 Presence of other vascular implants and grafts: Secondary | ICD-10-CM

## 2022-01-31 DIAGNOSIS — Z5111 Encounter for antineoplastic chemotherapy: Secondary | ICD-10-CM | POA: Diagnosis not present

## 2022-01-31 LAB — CMP (CANCER CENTER ONLY)
ALT: 29 U/L (ref 0–44)
AST: 19 U/L (ref 15–41)
Albumin: 3.9 g/dL (ref 3.5–5.0)
Alkaline Phosphatase: 112 U/L (ref 38–126)
Anion gap: 10 (ref 5–15)
BUN: 18 mg/dL (ref 8–23)
CO2: 25 mmol/L (ref 22–32)
Calcium: 9.2 mg/dL (ref 8.9–10.3)
Chloride: 98 mmol/L (ref 98–111)
Creatinine: 1.17 mg/dL — ABNORMAL HIGH (ref 0.44–1.00)
GFR, Estimated: 52 mL/min — ABNORMAL LOW (ref >60)
Glucose, Bld: 226 mg/dL — ABNORMAL HIGH (ref 70–99)
Potassium: 4.5 mmol/L (ref 3.5–5.1)
Sodium: 133 mmol/L — ABNORMAL LOW (ref 135–145)
Total Bilirubin: 0.4 mg/dL (ref 0.3–1.2)
Total Protein: 7.6 g/dL (ref 6.5–8.1)

## 2022-01-31 LAB — CBC WITH DIFFERENTIAL (CANCER CENTER ONLY)
Abs Immature Granulocytes: 0.01 10*3/uL (ref 0.00–0.07)
Basophils Absolute: 0 10*3/uL (ref 0.0–0.1)
Basophils Relative: 0 %
Eosinophils Absolute: 0 10*3/uL (ref 0.0–0.5)
Eosinophils Relative: 0 %
HCT: 29.4 % — ABNORMAL LOW (ref 36.0–46.0)
Hemoglobin: 9.8 g/dL — ABNORMAL LOW (ref 12.0–15.0)
Immature Granulocytes: 0 %
Lymphocytes Relative: 35 %
Lymphs Abs: 1 10*3/uL (ref 0.7–4.0)
MCH: 28.7 pg (ref 26.0–34.0)
MCHC: 33.3 g/dL (ref 30.0–36.0)
MCV: 86.2 fL (ref 80.0–100.0)
Monocytes Absolute: 0.5 10*3/uL (ref 0.1–1.0)
Monocytes Relative: 18 %
Neutro Abs: 1.3 10*3/uL — ABNORMAL LOW (ref 1.7–7.7)
Neutrophils Relative %: 47 %
Platelet Count: 141 10*3/uL — ABNORMAL LOW (ref 150–400)
RBC: 3.41 MIL/uL — ABNORMAL LOW (ref 3.87–5.11)
RDW: 14 % (ref 11.5–15.5)
WBC Count: 2.8 10*3/uL — ABNORMAL LOW (ref 4.0–10.5)
nRBC: 0 % (ref 0.0–0.2)

## 2022-01-31 LAB — LIPASE, BLOOD: Lipase: 24 U/L (ref 11–51)

## 2022-01-31 LAB — AMYLASE: Amylase: 100 U/L (ref 28–100)

## 2022-01-31 MED ORDER — HEPARIN SOD (PORK) LOCK FLUSH 100 UNIT/ML IV SOLN
500.0000 [IU] | Freq: Once | INTRAVENOUS | Status: AC
  Administered 2022-01-31: 500 [IU] via INTRAVENOUS

## 2022-01-31 MED ORDER — ONDANSETRON HCL 4 MG/2ML IJ SOLN
8.0000 mg | Freq: Once | INTRAMUSCULAR | Status: AC
  Administered 2022-01-31: 8 mg via INTRAVENOUS
  Filled 2022-01-31: qty 4

## 2022-01-31 MED ORDER — MORPHINE SULFATE (PF) 4 MG/ML IV SOLN
4.0000 mg | Freq: Once | INTRAVENOUS | Status: AC
  Administered 2022-01-31: 4 mg via INTRAVENOUS
  Filled 2022-01-31: qty 1

## 2022-01-31 MED ORDER — SODIUM CHLORIDE 0.9 % IV SOLN: Freq: Once | INTRAVENOUS | Status: DC

## 2022-01-31 MED ORDER — SODIUM CHLORIDE 0.9% FLUSH
10.0000 mL | Freq: Once | INTRAVENOUS | Status: AC
  Administered 2022-01-31: 10 mL via INTRAVENOUS

## 2022-01-31 MED ORDER — SODIUM CHLORIDE 0.9 % IV SOLN: Freq: Once | INTRAVENOUS | Status: AC

## 2022-01-31 NOTE — Assessment & Plan Note (Signed)
She tolerated treatment very poorly with recurrent ER visit and severe duodenitis Her performance status score has deteriorated significantly She has uncontrolled pain, dehydration, nausea and severe constipation I will try to provide supportive care here but she will likely needs to go to the emergency department for further evaluation

## 2022-01-31 NOTE — Assessment & Plan Note (Signed)
She has severe abdominal pain, uncontrolled by oral medications I have reviewed her CT imaging from the ER 3 days ago which showed duodenitis She is profoundly tender in the epigastric region She is not able to eat and have severe nausea I will provide IV fluids, pain medicine and IV antiemetics I will draw serum amylase and lipase to rule out pancreatitis

## 2022-01-31 NOTE — Assessment & Plan Note (Signed)
She tolerated chemotherapy very poorly with recurrent ER visit and hospitalization with different side effects each time I am concerned she might not be able to tolerate further chemotherapy I will attempt to provide supportive care here in the cancer center is much as I can but if her symptoms are not resolved within the next few hours, I will have to direct her to the emergency department for admission and management due to failed outpatient therapy

## 2022-01-31 NOTE — Assessment & Plan Note (Signed)
The patient has chronic kidney disease She is tachycardic and appears dehydrated We will provide IV fluid support now

## 2022-01-31 NOTE — Progress Notes (Signed)
Early OFFICE PROGRESS NOTE  Patient Care Team: [name], MD as PCP - General (Family Medicine) Croitoru, Dani Gobble, MD as PCP - Cardiology (Cardiology)  ASSESSMENT & PLAN:  Papillary serous adenocarcinoma of endometrium [facility]) She tolerated treatment very poorly with recurrent ER visit and severe duodenitis Her performance status score has deteriorated significantly She has uncontrolled pain, dehydration, nausea and severe constipation I will try to provide supportive care here but she will likely needs to go to the emergency department for further evaluation  Abdominal pain She has severe abdominal pain, uncontrolled by oral medications I have reviewed her CT imaging from the ER 3 days ago which showed duodenitis She is profoundly tender in the epigastric region She is not able to eat and have severe nausea I will provide IV fluids, pain medicine and IV antiemetics I will draw serum amylase and lipase to rule out pancreatitis  CKD (chronic kidney disease), stage III ([place]) The patient has chronic kidney disease She is tachycardic and appears dehydrated We will provide IV fluid support now  Goals of care, counseling/discussion She tolerated chemotherapy very poorly with recurrent ER visit and hospitalization with different side effects each time I am concerned she might not be able to tolerate further chemotherapy I will attempt to provide supportive care here in the cancer center is much as I can but if her symptoms are not resolved within the next few hours, I will have to direct her to the emergency department for admission and management due to failed outpatient therapy  Orders Placed This Encounter  Procedures   Amylase    Standing Status:   Future    Number of Occurrences:   1    Standing Expiration Date:   01/31/2023   Lipase, blood    Standing Status:   Future    Number of Occurrences:   1    Standing Expiration Date:   01/31/2023    All  questions were answered. The patient knows to call the clinic with any problems, questions or concerns. The total time spent in the appointment was 40 minutes encounter with patients including review of chart and various tests results, discussions about plan of care and coordination of care plan   Heath Lark, MD 01/31/2022 12:45 PM  INTERVAL HISTORY: Please see below for problem oriented charting. she returns for urgent evaluation She was brought into the ER by ambulance 3 days ago for severe abdominal pain I have reviewed documentation, blood work and imaging study which showed some duodenitis The patient was discharged several hours later but did not feel better Her daughter has attempted to give the patient round-the-clock pain medicine, antiemetics and clear liquid diet but she is not getting better She has no bowel movement since last Wednesday She appears weak and debilitated in my office  REVIEW OF SYSTEMS:   Constitutional: Denies fevers, chills or abnormal weight loss Eyes: Denies blurriness of vision Ears, nose, mouth, throat, and face: Denies mucositis or sore throat Respiratory: Denies cough, dyspnea or wheezes Cardiovascular: Denies palpitation, chest discomfort or lower extremity swelling Skin: Denies abnormal skin rashes Lymphatics: Denies new lymphadenopathy or easy bruising Behavioral/Psych: Mood is stable, no new changes  All other systems were reviewed with the patient and are negative.  I have reviewed the past medical history, past surgical history, social history and family history with the patient and they are unchanged from previous note.  ALLERGIES:  has No Known Allergies.  MEDICATIONS:  Current Outpatient Medications  Medication  Sig Dispense Refill   aspirin 81 MG EC tablet Take 1 tablet (81 mg total) by mouth daily. 30 tablet 12   atorvastatin (LIPITOR) 80 MG tablet TAKE 1 TABLET (80 MG TOTAL) BY MOUTH AT BEDTIME. 30 tablet 6   dexamethasone (DECADRON) 4  MG tablet Take 2 tablets by mouth the night before and 2 tablets the morning of chemotherapy, every 3 weeks x 6 cycles 36 tablet 6   Dulaglutide (TRULICITY) 1.5 AJ/2.8NO SOPN Inject 1.5 mg into the skin once a week. (Patient taking differently: Inject 1.5 mg into the skin once a week. Friday/Saturday) 2 mL 2   furosemide (LASIX) 20 MG tablet Take 2 tablets (40 mg total) by mouth daily. 60 tablet 2   glimepiride (AMARYL) 4 MG tablet Take 1 tablet (4 mg total) by mouth daily with breakfast. 30 tablet 6   isosorbide mononitrate (IMDUR) 30 MG 24 hr tablet Take 2 tablets (60 mg total) by mouth daily. 60 tablet 2   lisinopril (ZESTRIL) 20 MG tablet TAKE 1 TABLET BY MOUTH ONCE A DAY 30 tablet 2   metFORMIN (GLUCOPHAGE) 500 MG tablet Take 2 tablets (1,000 mg total) by mouth 2 (two) times daily with a meal. 120 tablet 2   metoprolol tartrate (LOPRESSOR) 100 MG tablet TAKE 1 TABLET (100 MG TOTAL) BY MOUTH 2 (TWO) TIMES DAILY. 60 tablet 2   nitroGLYCERIN (NITROSTAT) 0.4 MG SL tablet Place 1 tablet (0.4 mg total) under the tongue every 5 (five) minutes as needed for chest pain. (Patient not taking: Reported on 01/08/2022) 25 tablet 2   ondansetron (ZOFRAN) 8 MG tablet Take 1 tablet by mouth 2  times daily as needed for refractory nausea / vomiting. Start on day 3 after carboplatin chemo. 30 tablet 1   oxyCODONE (OXY IR/ROXICODONE) 5 MG immediate release tablet Take 1 tablet by mouth every 6 hours as needed for severe pain. 30 tablet 0   pantoprazole (PROTONIX) 40 MG tablet Take 1 tablet (40 mg total) by mouth daily. 30 tablet 1   prochlorperazine (COMPAZINE) 10 MG tablet Take 1 tablet by mouth every 6  hours as needed (Nausea or vomiting). 30 tablet 1   No current facility-administered medications for this visit.   Facility-Administered Medications Ordered in Other Visits  Medication Dose Route Frequency Provider Last Rate Last Admin   morphine (PF) 4 MG/ML injection 4 mg  4 mg Intravenous Once Alvy Bimler, Luva Metzger, MD        ondansetron (ZOFRAN) injection 8 mg  8 mg Intravenous Once Heath Lark, MD        SUMMARY OF ONCOLOGIC HISTORY: Oncology History Overview Note  Pap 12/14/20: adenocarcinoma, HPV negative  HER-2 positive by FISH Her-2 equivocal by [facility]  MSI stable    Papillary serous adenocarcinoma of endometrium ([place])   Initial Diagnosis   Endometrial carcinoma ([place])   01/12/2021 Initial Biopsy   A. ENDOCERVIX, CURETTAGE:  - Adenocarcinoma.  B. ENDOMETRIUM, BIOPSY:  - Adenocarcinoma.  COMMENT:  The differential diagnosis includes high grade serous carcinoma.  Both  specimens have a similar morphology; high grade serous carcinoma more  commonly arises in the endometrium.  Results reported to Dr. Hale Bogus  on 01/15/2021.  Dr. Saralyn Pilar reviewed the case.    01/16/2021 Tumor Marker   Patient's tumor was tested for the following markers: CA-125 Results of the tumor marker test revealed normal value, 10.7   01/25/2021 Imaging   CT C/A/P: 1. Small volume fluid in the endometrial canal with 7 mm hypoattenuating lesion  in the myometrium of the anterior fundus. 2. No definite evidence for metastatic disease in the chest, abdomen, or pelvis. Small lymph nodes are noted along both pelvic sidewalls. Attention on follow-up recommended. 3. 5 mm ground-glass opacity peripheral left upper lobe. This is likely related to infection/inflammation and potentially scar. Attention on surveillance imaging recommended. 4. 5.8 cm lesion posterior lower uterine segment likely a fibroid. Ultrasound exam from 01/28/2010 demonstrated a 5.2 cm lesion in the left aspect of the posterior lower uterine body. 5. Aortic Atherosclerosis (ICD10-I70.0).   01/29/2021 Surgery   TRH/BSO, SLN biopsy left, selective pelvic LND right, omentectomy  Findings: On EUA, 10-12cm enlarged mobile uterus. On intra-abdomina entry, minimal adhesions between the liver and anterior abdomen on the right. Some changes c/w fatty liver on the  left. Omentum, stomach, small and large bowel all grossly normal. Bilateral ovaries normal appearing. Uterus with 5-6cm posterior fibroid, otherwise normal appearing. No mapping to right pelvis. On left, mapping to the level of the superior vessel artery, enlarged lymph node noted (likely sentinel) within the upper aspect of the obturator space. In bilateral pelvic basins, multiple enlarged lymph nodes. On the right, enlarged lymph nodes extended superiorly along the common iliac vessels. At the end of surgery, no obvious abdominal or pelvic evidence of disease.   01/29/2021 Pathology Results   A. SENTINEL LYMPH NODE, LEFT INTERNAL ILIAC, BIOPSY:  - Metastatic carcinoma in (1) of (1) lymph node.   B. SENTINEL LYMPH NODE, LEFT OBTURATOR, BIOPSY:  - Metastatic carcinoma in (1) of (1) lymph node.   C. LYMPH NODES, LEFT PELVIC, DISSECTION:  - Metastatic carcinoma in (1) of (3) lymph nodes.   D. LYMPH NODE, RIGHT EXTERNAL AND COMMON ILIAC, BIOPSY:  - Metastatic carcinoma in (1) of (1) lymph node.   E. UTERUS, CERVIX AND BILATERAL FALLOPIAN TUBES AND OVARIES, TOTAL  HYSTERECTOMY AND BILATERAL SALPINGO-OOPHORECTOMY:  - High grade serous carcinoma of endometrium, with invasion more than  half of the myometrium.  - Tumor invades the stromal connective tissue of the cervix.  - No involvement of uterine serosa or adnexa.  - Lymphovascular invasion is identified.  - See oncology table.   F. OMENTUM, OMENTECTOMY:  - Omentum, negative for carcinoma.   G. LYMPH NODES, RIGHT PELVIC, DISSECTION:  - Two lymph nodes, negative for carcinoma (0/2).   ONCOLOGY TABLE:   UTERUS, CARCINOMA OR CARCINOSARCOMA: Resection   Procedure: Total hysterectomy and bilateral salpingo-oophorectomy,  Omentectomy, Lymph node sampling  Histologic Type: Serous carcinoma  Histologic Grade: High grade  Myometrial Invasion: > 50%  Uterine Serosa Involvement: Not identified  Cervical Stroma Involvement: Present  Other  Tissue/Organ Involvement: Not identified  Peritoneal/Ascitic Fluid: Negative for carcinoma  Lymphovascular Invasion: Present  Regional Lymph Nodes:       Pelvic Lymph Nodes Examined:            2 Sentinel            6 Non-Sentinel            8 Total       Pelvic Lymph Nodes with Metastasis: 4            Macrometastasis: 3            Micrometastasis: 1            Isolated Tumor Cells: 0            Laterality of Lymph Nodes with Tumor: Right (non-sentinel),  Left (sentinel and non-sentinel)  Extracapsular Extension: Present       Para-Aortic Lymph Nodes Examined: Not applicable        Para-Aortic Lymph Nodes with Metastasis: Not applicable  Distant Metastasis:       Distant Site(s) Involved: Omentum: Not involved  Pathologic Stage Classification (pTNM, AJCC 8th Edition): pT2, pN1a  Ancillary Studies: HER2 will be ordered  Additional Findings: Leiomyomata.  Adenomyosis.  Representative Tumor Block: B1  Comment: Dr. Saralyn Pilar reviewed select slides.  (v4.2.0.1)    01/29/2021 Cancer Staging   Staging form: Corpus Uteri - Carcinoma and Carcinosarcoma, AJCC 8th Edition - Clinical stage from 01/29/2021: FIGO Stage IIIC1 (cT1b, cN1a, cM0) - Signed by Lafonda Mosses, MD on 02/02/2021 Histopathologic type: Mixed cell adenocarcinoma Stage prefix: Initial diagnosis Method of lymph node assessment: Other Histologic grade (G): G3 Histologic grading system: 3 grade system Lymph-vascular invasion (LVI): LVI present/identified, NOS Peritoneal cytology results: Negative Pelvic nodal status: Positive Number of pelvic nodes positive from dissection: 4 Number of pelvic nodes examined during dissection: 8 Para-aortic status: Not assessed Lymph node metastasis: Present Omentectomy performed: Yes Morcellation performed: No    02/19/2021 Echocardiogram    1. Left ventricular ejection fraction, by estimation, is 55 to 60%. The left ventricle has normal function. The left ventricle  demonstrates regional wall motion abnormalities (see scoring diagram/findings for description). There is mild left ventricular  hypertrophy. Left ventricular diastolic parameters are consistent with Grade I diastolic dysfunction (impaired relaxation). There is moderate hypokinesis of the left ventricular, basal septal wall and inferior wall. The average left ventricular global longitudinal strain is -17.9 %. The global longitudinal strain is abnormal.  2. Right ventricular systolic function is normal. The right ventricular size is normal. There is normal pulmonary artery systolic pressure. The estimated right ventricular systolic pressure is 50.5 mmHg.  3. The mitral valve is abnormal. Trivial mitral valve regurgitation.  4. The aortic valve is tricuspid. Aortic valve regurgitation is not visualized.  5. The inferior vena cava is normal in size with greater than 50% respiratory variability, suggesting right atrial pressure of 3 mmHg.   02/20/2021 Procedure   Successful placement of a right IJ approach Power Port with ultrasound and fluoroscopic guidance. The catheter is ready for use.   02/28/2021 - 06/14/2021 Chemotherapy    Patient is on Treatment Plan: UTERINE CARBOPLATIN AUC 6 / PACLITAXEL Q21D       04/20/2021 Imaging   Status post hysterectomy and suspected bilateral salpingo-oophorectomy.   2.2 cm fluid density lesion along the left pelvic sidewall likely reflects a postoperative seroma.   No findings suspicious for recurrent or metastatic disease.   07/16/2021 - 08/16/2021 Radiation Therapy   Radiation Treatment Dates: 07/16/2021 through 08/16/2021 Site Technique Total Dose (Gy) Dose per Fx (Gy) Completed Fx Beam Energies  Vagina: Pelvis HDR-brachy 30/30 6 5/5 Ir-192        10/01/2021 Imaging   Increasing size of LEFT pelvic lymph nodes, largest approximately 11 mm along the external iliac chain. Given findings and history could consider PET for further evaluation as warranted.   Cystic  area along the LEFT pelvic sidewall that was seen previously has improved.   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   Aortic Atherosclerosis (ICD10-I70.0).   12/21/2021 Imaging   1. Enlarging soft tissue masses involving the vaginal cuff. 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 Speculum examination and re-biopsy may be confirmatory. PET-CT may be helpful.  2. New small sub 5 mm perirectal and sigmoid mesocolon nodes. 3. Slight progression of pelvic adenopathy as detailed above. 4. No findings suspicious for abdominal metastatic disease or osseous metastatic disease. 5. Stable age advanced atherosclerotic calcifications involving the aorta and branch vessels and coronary arteries   12/25/2021 Echocardiogram    1. Global longitudinal strain is -16.1% Mild basal inferior hypoinesis. Compared to echo from 02/2021, no change in overall LVEF /regional wall motion. Global longitudinal strain is mildly less negative (-17.9% to -16.1%. Left ventricular ejection fraction, by estimation, is 65 to 70%. The left ventricle has normal function. The left ventricle has no regional wall motion abnormalities. There is mild left ventricular hypertrophy. Left ventricular diastolic parameters are indeterminate.  2. Right ventricular systolic function is normal. The right ventricular size is normal. There is normal pulmonary artery systolic pressure.  3. Trivial mitral valve regurgitation.  4. The aortic valve is normal in structure. Aortic valve regurgitation is not visualized.  5. The inferior vena cava is normal in size with greater than 50% respiratory variability, suggesting right atrial pressure of 3 mmHg.     01/01/2022 -  Chemotherapy   Patient is on Treatment Plan : UTERINE SEROUS CARCINOMA Carboplatin + Paclitaxel + Trastuzumab q21d x 6 Cycles / Trastuzumab q21d       PHYSICAL  EXAMINATION: ECOG PERFORMANCE STATUS: 3 - Symptomatic, >50% confined to bed  Vitals:   01/31/22 1137  BP: (!) 160/92  Pulse: (!) 113  Resp: 18  Temp: 99.1 F (37.3 C)  SpO2: 100%   Filed Weights   01/31/22 1137  Weight: 215 lb (97.5 kg)    GENERAL:alert, in distress from pain SKIN: She looks pale EYES: normal, Conjunctiva are pink and non-injected, sclera clear OROPHARYNX:no exudate, no erythema and lips, buccal mucosa, and tongue normal  NECK: supple, thyroid normal size, non-tender, without nodularity LYMPH:  no palpable lymphadenopathy in the cervical, axillary or inguinal LUNGS: clear to auscultation and percussion with normal breathing effort HEART: Tachycardia, no murmurs and no lower extremity edema ABDOMEN:abdomen is tender to palpation in the epigastric region Musculoskeletal:no cyanosis of digits and no clubbing  NEURO: alert & oriented x 3 with fluent speech, no focal motor/sensory deficits  LABORATORY DATA:  I have reviewed the data as listed    Component Value Date/Time   NA 134 (L) 01/28/2022 1518   NA 143 10/18/2020 1028   K 4.4 01/28/2022 1518   CL 104 01/28/2022 1518   CO2 23 01/28/2022 1518   GLUCOSE 233 (H) 01/28/2022 1518   BUN 25 (H) 01/28/2022 1518   BUN 13 10/18/2020 1028   CREATININE 1.16 (H) 01/28/2022 1518   CREATININE 0.97 01/22/2022 0808   CREATININE 0.79 02/11/2017 0943   CALCIUM 7.9 (L) 01/28/2022 1518   PROT 6.2 (L) 01/28/2022 1518   PROT 6.7 10/18/2020 1028   ALBUMIN 2.9 (L) 01/28/2022 1518   ALBUMIN 4.1 10/18/2020 1028   AST 22 01/28/2022 1518   AST 15 01/22/2022 0808   ALT 29 01/28/2022 1518   ALT 22 01/22/2022 0808   ALKPHOS 91 01/28/2022 1518   BILITOT 0.3 01/28/2022 1518   BILITOT 0.4 01/22/2022 0808   GFRNONAA 53 (L) 01/28/2022 1518   GFRNONAA >60 01/22/2022 0808   GFRNONAA 83 02/11/2017 0943   GFRAA 70 10/18/2020 1028   GFRAA >89 02/11/2017 0943    No results found for: SPEP, UPEP  Lab Results  Component Value  Date   WBC 2.8 (L) 01/31/2022   NEUTROABS 1.3 (  L) 01/31/2022   HGB 9.8 (L) 01/31/2022   HCT 29.4 (L) 01/31/2022   MCV 86.2 01/31/2022   PLT 141 (L) 01/31/2022      Chemistry      Component Value Date/Time   NA 134 (L) 01/28/2022 1518   NA 143 10/18/2020 1028   K 4.4 01/28/2022 1518   CL 104 01/28/2022 1518   CO2 23 01/28/2022 1518   BUN 25 (H) 01/28/2022 1518   BUN 13 10/18/2020 1028   CREATININE 1.16 (H) 01/28/2022 1518   CREATININE 0.97 01/22/2022 0808   CREATININE 0.79 02/11/2017 0943      Component Value Date/Time   CALCIUM 7.9 (L) 01/28/2022 1518   ALKPHOS 91 01/28/2022 1518   AST 22 01/28/2022 1518   AST 15 01/22/2022 0808   ALT 29 01/28/2022 1518   ALT 22 01/22/2022 0808   BILITOT 0.3 01/28/2022 1518   BILITOT 0.4 01/22/2022 6568       RADIOGRAPHIC STUDIES: I have reviewed her CT imaging I have personally reviewed the radiological images as listed and agreed with the findings in the report. CT Abdomen Pelvis W Contrast  Result Date: 01/28/2022 CLINICAL DATA:  Epigastric abdominal pain, shortness of breath, nausea vomiting, endometrial carcinoma receiving chemotherapy EXAM: CT ABDOMEN AND PELVIS WITH CONTRAST TECHNIQUE: Multidetector CT imaging of the abdomen and pelvis was performed using the standard protocol following bolus administration of intravenous contrast. RADIATION DOSE REDUCTION: This exam was performed according to the departmental dose-optimization program which includes automated exposure control, adjustment of the mA and/or kV according to patient size and/or use of iterative reconstruction technique. CONTRAST:  158m OMNIPAQUE IOHEXOL 300 MG/ML  SOLN COMPARISON:  01/07/2022 FINDINGS: Lower chest: No acute abnormality. Hepatobiliary: No focal liver abnormality is seen. No gallstones, gallbladder wall thickening, or biliary dilatation. Pancreas: Unremarkable. No pancreatic ductal dilatation or surrounding inflammatory changes. Spleen: Normal in size  without focal abnormality. Adrenals/Urinary Tract: Adrenal glands are unremarkable. Kidneys are normal, without renal calculi, focal lesion, or hydronephrosis. Bladder is unremarkable. Stomach/Bowel: Similar nonspecific thickening of the duodenal C loop may be related to under distension versus improving duodenitis when compared to 01/07/2022. Negative for bowel obstruction, significant dilatation, ileus, or free air. Normal retrocecal appendix identified. Scattered colonic diverticulosis without acute inflammatory process. No free fluid, fluid collection, hemorrhage, hematoma, abscess or ascites. Vascular/Lymphatic: Aortic atherosclerosis noted without aneurysm, dissection or occlusive process. No retroperitoneal hemorrhage or hematoma. Mesenteric and renal vasculature appears to remain patent. No veno-occlusive process. Stable left iliac pelvic sidewall mild adenopathy measuring 1.4 cm in diameter, image 70/2. No inguinal adenopathy. Reproductive: Previous hysterectomy. Stable indeterminate soft tissue prominence of the vaginal cuff. No pelvic free fluid, fluid collection, hemorrhage or hematoma. No adnexal mass. Other: No abdominal wall hernia or abnormality. No abdominopelvic ascites. Musculoskeletal: Degenerative changes throughout the spine with lower lumbar facet arthropathy. No acute osseous finding. IMPRESSION: No acute intra-abdominal or pelvic finding by CT. Interval improvement in the previous mild duodenitis changes by CT when compared to 01/07/2022. Colonic diverticulosis without acute inflammatory process. Normal appendix Remote hysterectomy. Stable indeterminate soft tissue prominence of the vaginal cuff and left pelvic sidewall mild adenopathy, suspicious for residual disease. Aortic Atherosclerosis (ICD10-I70.0). Electronically Signed   By: MJerilynn Mages  Shick M.D.   On: 01/28/2022 17:41   CT ABDOMEN PELVIS W CONTRAST  Result Date: 01/08/2022 CLINICAL DATA:  Abdominal pain. History of uterine/cervical  cancer status post prior hysterectomy. EXAM: CT ABDOMEN AND PELVIS WITH CONTRAST TECHNIQUE: Multidetector CT imaging of the abdomen and  pelvis was performed using the standard protocol following bolus administration of intravenous contrast. RADIATION DOSE REDUCTION: This exam was performed according to the departmental dose-optimization program which includes automated exposure control, adjustment of the mA and/or kV according to patient size and/or use of iterative reconstruction technique. CONTRAST:  145m OMNIPAQUE IOHEXOL 300 MG/ML  SOLN COMPARISON:  CT abdomen pelvis dated 12/21/2021. FINDINGS: Lower chest: The visualized lung bases are clear. There is coronary vascular calcification. No intra-abdominal free air or free fluid. Hepatobiliary: The liver is unremarkable. No intrahepatic biliary dilatation. The gallbladder is unremarkable. Pancreas: There is mild haziness adjacent to the head and uncinate process of the pancreas, likely related to inflammatory changes of the duodenum. Acute pancreatitis is not excluded correlation with pancreatic enzymes recommended. No drainable fluid collection/abscess or pseudocyst. Spleen: Normal in size without focal abnormality. Adrenals/Urinary Tract: The adrenal glands unremarkable. The kidneys, visualized ureters, and urinary bladder appear unremarkable. Stomach/Bowel: There is sigmoid diverticulosis without active inflammatory changes. There is inflammatory changes of the duodenal C-loop which may represent duodenitis. There is no bowel obstruction. The appendix is normal. Vascular/Lymphatic: Moderate aortoiliac atherosclerotic disease. The IVC is unremarkable. No portal venous gas. Slightly enlarged appearance of a mildly enlarged left pelvic sidewall lymph node measuring 14 mm short axis (66/2). Mildly enlarged lymph node medial to the left psoas measures 12 mm short axis. Reproductive: Hysterectomy. Grossly similar or slightly enlarged ovoid mass in the left pelvis  measuring 3.7 x 2.1 cm (previously 3.1 x 2.5 cm) associated with left vaginal fornix. Similar appearance of the right vaginal fornix with soft tissue nodularity. Several small mildly rounded perirectal lymph nodes again noted Other: None Musculoskeletal: Degenerative changes of the spine. No acute osseous pathology. IMPRESSION: 1. Findings may represent duodenitis versus pancreatitis. Clinical correlation is recommended. 2. Sigmoid diverticulosis. No bowel obstruction. Normal appendix. 3. Slightly enlarged soft tissue masses associated with vaginal cuff as well as increase in the size of the retroperitoneal adenopathy compared to the prior CT. 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 Clinical correlation is recommended. 4. Aortic Atherosclerosis (ICD10-I70.0). Electronically Signed   By: AAnner CreteM.D.   On: 01/08/2022 00:20   DG Chest Port 1 View  Result Date: 01/28/2022 CLINICAL DATA:  Dyspnea, uterine cancer on chemotherapy EXAM: PORTABLE CHEST 1 VIEW COMPARISON:  01/04/2022 chest radiograph. FINDINGS: Right internal jugular Port-A-Cath terminates in the lower third of the SVC. Stable cardiomediastinal silhouette with normal heart size. No pneumothorax. No pleural effusion. Lungs appear clear, with no acute consolidative airspace disease and no pulmonary edema. IMPRESSION: No active disease. Electronically Signed   By: JIlona SorrelM.D.   On: 01/28/2022 15:18   DG Chest Port 1 View  Result Date: 01/07/2022 CLINICAL DATA:  Questionable sepsis - evaluate for abnormality Nausea and weakness. EXAM: PORTABLE CHEST 1 VIEW COMPARISON:  07/09/2021 FINDINGS: Right chest port in place. Lung volumes are low. Mild vascular congestion without pulmonary edema. The heart is normal in size. Coronary stent. No focal airspace disease. No pleural effusion or pneumothorax. No acute osseous abnormalities are seen. IMPRESSION: Low lung volumes with mild vascular congestion. Electronically Signed   By: MKeith Rake M.D.   On: 01/07/2022 23:48

## 2022-01-31 NOTE — Patient Instructions (Signed)

## 2022-02-01 ENCOUNTER — Telehealth: Payer: Self-pay

## 2022-02-01 ENCOUNTER — Encounter: Payer: Self-pay | Admitting: Hematology and Oncology

## 2022-02-01 ENCOUNTER — Inpatient Hospital Stay: Payer: Medicaid Other

## 2022-02-01 ENCOUNTER — Other Ambulatory Visit: Payer: Self-pay | Admitting: Hematology and Oncology

## 2022-02-01 ENCOUNTER — Other Ambulatory Visit (HOSPITAL_COMMUNITY): Payer: Self-pay

## 2022-02-01 VITALS — BP 163/84 | HR 101 | Temp 98.4°F | Resp 18

## 2022-02-01 DIAGNOSIS — R1084 Generalized abdominal pain: Secondary | ICD-10-CM

## 2022-02-01 DIAGNOSIS — Z5111 Encounter for antineoplastic chemotherapy: Secondary | ICD-10-CM | POA: Diagnosis not present

## 2022-02-01 DIAGNOSIS — C541 Malignant neoplasm of endometrium: Secondary | ICD-10-CM

## 2022-02-01 MED ORDER — ONDANSETRON HCL 4 MG/2ML IJ SOLN
8.0000 mg | Freq: Once | INTRAMUSCULAR | Status: AC
  Administered 2022-02-01: 8 mg via INTRAVENOUS
  Filled 2022-02-01: qty 4

## 2022-02-01 MED ORDER — MORPHINE SULFATE (PF) 4 MG/ML IV SOLN
4.0000 mg | Freq: Once | INTRAVENOUS | Status: AC
  Administered 2022-02-01: 4 mg via INTRAVENOUS
  Filled 2022-02-01: qty 1

## 2022-02-01 MED ORDER — HEPARIN SOD (PORK) LOCK FLUSH 100 UNIT/ML IV SOLN
500.0000 [IU] | Freq: Once | INTRAVENOUS | Status: AC | PRN
  Administered 2022-02-01: 500 [IU]

## 2022-02-01 MED ORDER — SODIUM CHLORIDE 0.9 % IV SOLN: Freq: Once | INTRAVENOUS | Status: AC

## 2022-02-01 MED ORDER — MORPHINE SULFATE 4 MG/ML IJ SOLN: 4.0000 mg | Freq: Once | INTRAMUSCULAR | Status: DC

## 2022-02-01 MED ORDER — SODIUM CHLORIDE 0.9% FLUSH
10.0000 mL | Freq: Once | INTRAVENOUS | Status: AC | PRN
  Administered 2022-02-01: 10 mL

## 2022-02-01 MED ORDER — MORPHINE SULFATE 15 MG PO TABS
15.0000 mg | ORAL_TABLET | ORAL | 0 refills | Status: DC | PRN
  Filled 2022-02-01: qty 30, 5d supply, fill #0

## 2022-02-01 NOTE — Progress Notes (Signed)
RN received V/O from Dr. Alvy Bimler for Morphone 4 mg IV  x 1 dose to be given in infusion today.

## 2022-02-01 NOTE — Telephone Encounter (Signed)
Called daughter and instructed to take Morphine prn for pain. Rx sent to [facility]. Daughter verbalized understanding.

## 2022-02-01 NOTE — Patient Instructions (Signed)

## 2022-02-02 ENCOUNTER — Other Ambulatory Visit: Payer: Self-pay

## 2022-02-02 ENCOUNTER — Inpatient Hospital Stay: Payer: Medicaid Other

## 2022-02-02 VITALS — BP 111/59 | HR 69 | Temp 97.9°F | Resp 17

## 2022-02-02 DIAGNOSIS — Z5111 Encounter for antineoplastic chemotherapy: Secondary | ICD-10-CM | POA: Diagnosis not present

## 2022-02-02 DIAGNOSIS — C541 Malignant neoplasm of endometrium: Secondary | ICD-10-CM

## 2022-02-02 MED ORDER — MORPHINE SULFATE (PF) 4 MG/ML IV SOLN
4.0000 mg | Freq: Once | INTRAVENOUS | Status: AC
  Administered 2022-02-02: 4 mg via INTRAVENOUS
  Filled 2022-02-02: qty 1

## 2022-02-02 MED ORDER — ONDANSETRON HCL 4 MG/2ML IJ SOLN
8.0000 mg | Freq: Once | INTRAMUSCULAR | Status: AC
  Administered 2022-02-02: 8 mg via INTRAVENOUS
  Filled 2022-02-02: qty 4

## 2022-02-02 MED ORDER — HEPARIN SOD (PORK) LOCK FLUSH 100 UNIT/ML IV SOLN
500.0000 [IU] | Freq: Once | INTRAVENOUS | Status: AC | PRN
  Administered 2022-02-02: 500 [IU]

## 2022-02-02 MED ORDER — SODIUM CHLORIDE 0.9% FLUSH
10.0000 mL | Freq: Once | INTRAVENOUS | Status: AC | PRN
  Administered 2022-02-02: 10 mL

## 2022-02-02 MED ORDER — SODIUM CHLORIDE 0.9 % IV SOLN: Freq: Once | INTRAVENOUS | Status: AC

## 2022-02-02 MED ORDER — ALTEPLASE 2 MG IJ SOLR: 2.0000 mg | Freq: Once | INTRAMUSCULAR | Status: DC | PRN

## 2022-02-02 NOTE — Patient Instructions (Signed)

## 2022-02-04 ENCOUNTER — Ambulatory Visit: Payer: Self-pay

## 2022-02-04 ENCOUNTER — Emergency Department (HOSPITAL_COMMUNITY): Payer: Medicaid Other

## 2022-02-04 ENCOUNTER — Other Ambulatory Visit: Payer: Self-pay

## 2022-02-04 ENCOUNTER — Observation Stay (HOSPITAL_COMMUNITY): Payer: Medicaid Other

## 2022-02-04 ENCOUNTER — Inpatient Hospital Stay (HOSPITAL_COMMUNITY)
Admission: EM | Admit: 2022-02-04 | Discharge: 2022-02-18 | DRG: 391 | Disposition: A | Discharge status: Home Health | Payer: Medicaid Other | Attending: Family Medicine | Admitting: Family Medicine

## 2022-02-04 ENCOUNTER — Encounter (HOSPITAL_COMMUNITY): Payer: Self-pay | Admitting: *Deleted

## 2022-02-04 ENCOUNTER — Encounter: Payer: Self-pay | Admitting: Hematology and Oncology

## 2022-02-04 ENCOUNTER — Telehealth: Payer: Self-pay

## 2022-02-04 DIAGNOSIS — R7989 Other specified abnormal findings of blood chemistry: Secondary | ICD-10-CM | POA: Diagnosis not present

## 2022-02-04 DIAGNOSIS — E860 Dehydration: Secondary | ICD-10-CM | POA: Diagnosis present

## 2022-02-04 DIAGNOSIS — Z955 Presence of coronary angioplasty implant and graft: Secondary | ICD-10-CM

## 2022-02-04 DIAGNOSIS — E669 Obesity, unspecified: Secondary | ICD-10-CM | POA: Diagnosis present

## 2022-02-04 DIAGNOSIS — Z7982 Long term (current) use of aspirin: Secondary | ICD-10-CM

## 2022-02-04 DIAGNOSIS — E43 Unspecified severe protein-calorie malnutrition: Secondary | ICD-10-CM | POA: Diagnosis present

## 2022-02-04 DIAGNOSIS — E1165 Type 2 diabetes mellitus with hyperglycemia: Secondary | ICD-10-CM | POA: Diagnosis present

## 2022-02-04 DIAGNOSIS — Z79899 Other long term (current) drug therapy: Secondary | ICD-10-CM

## 2022-02-04 DIAGNOSIS — Z833 Family history of diabetes mellitus: Secondary | ICD-10-CM

## 2022-02-04 DIAGNOSIS — Y929 Unspecified place or not applicable: Secondary | ICD-10-CM

## 2022-02-04 DIAGNOSIS — Z6839 Body mass index (BMI) 39.0-39.9, adult: Secondary | ICD-10-CM

## 2022-02-04 DIAGNOSIS — C55 Malignant neoplasm of uterus, part unspecified: Secondary | ICD-10-CM

## 2022-02-04 DIAGNOSIS — D696 Thrombocytopenia, unspecified: Secondary | ICD-10-CM | POA: Diagnosis present

## 2022-02-04 DIAGNOSIS — K5732 Diverticulitis of large intestine without perforation or abscess without bleeding: Principal | ICD-10-CM | POA: Diagnosis present

## 2022-02-04 DIAGNOSIS — R197 Diarrhea, unspecified: Secondary | ICD-10-CM

## 2022-02-04 DIAGNOSIS — K219 Gastro-esophageal reflux disease without esophagitis: Secondary | ICD-10-CM | POA: Diagnosis present

## 2022-02-04 DIAGNOSIS — W19XXXA Unspecified fall, initial encounter: Secondary | ICD-10-CM | POA: Diagnosis present

## 2022-02-04 DIAGNOSIS — I130 Hypertensive heart and chronic kidney disease with heart failure and stage 1 through stage 4 chronic kidney disease, or unspecified chronic kidney disease: Secondary | ICD-10-CM | POA: Diagnosis present

## 2022-02-04 DIAGNOSIS — D6481 Anemia due to antineoplastic chemotherapy: Secondary | ICD-10-CM | POA: Diagnosis present

## 2022-02-04 DIAGNOSIS — I2510 Atherosclerotic heart disease of native coronary artery without angina pectoris: Secondary | ICD-10-CM | POA: Diagnosis present

## 2022-02-04 DIAGNOSIS — I5032 Chronic diastolic (congestive) heart failure: Secondary | ICD-10-CM | POA: Diagnosis present

## 2022-02-04 DIAGNOSIS — R638 Other symptoms and signs concerning food and fluid intake: Secondary | ICD-10-CM

## 2022-02-04 DIAGNOSIS — Z87891 Personal history of nicotine dependence: Secondary | ICD-10-CM

## 2022-02-04 DIAGNOSIS — K5792 Diverticulitis of intestine, part unspecified, without perforation or abscess without bleeding: Secondary | ICD-10-CM | POA: Diagnosis not present

## 2022-02-04 DIAGNOSIS — D6959 Other secondary thrombocytopenia: Secondary | ICD-10-CM | POA: Diagnosis present

## 2022-02-04 DIAGNOSIS — E785 Hyperlipidemia, unspecified: Secondary | ICD-10-CM | POA: Diagnosis present

## 2022-02-04 DIAGNOSIS — C541 Malignant neoplasm of endometrium: Secondary | ICD-10-CM | POA: Diagnosis present

## 2022-02-04 DIAGNOSIS — D849 Immunodeficiency, unspecified: Secondary | ICD-10-CM | POA: Diagnosis present

## 2022-02-04 DIAGNOSIS — R32 Unspecified urinary incontinence: Secondary | ICD-10-CM | POA: Diagnosis present

## 2022-02-04 DIAGNOSIS — I10 Essential (primary) hypertension: Secondary | ICD-10-CM | POA: Diagnosis present

## 2022-02-04 DIAGNOSIS — N1831 Chronic kidney disease, stage 3a: Secondary | ICD-10-CM | POA: Diagnosis present

## 2022-02-04 DIAGNOSIS — I252 Old myocardial infarction: Secondary | ICD-10-CM

## 2022-02-04 DIAGNOSIS — T451X5A Adverse effect of antineoplastic and immunosuppressive drugs, initial encounter: Secondary | ICD-10-CM | POA: Diagnosis present

## 2022-02-04 DIAGNOSIS — K719 Toxic liver disease, unspecified: Secondary | ICD-10-CM | POA: Diagnosis present

## 2022-02-04 DIAGNOSIS — K6289 Other specified diseases of anus and rectum: Secondary | ICD-10-CM | POA: Diagnosis present

## 2022-02-04 DIAGNOSIS — Z8249 Family history of ischemic heart disease and other diseases of the circulatory system: Secondary | ICD-10-CM

## 2022-02-04 DIAGNOSIS — Z7984 Long term (current) use of oral hypoglycemic drugs: Secondary | ICD-10-CM

## 2022-02-04 DIAGNOSIS — E1122 Type 2 diabetes mellitus with diabetic chronic kidney disease: Secondary | ICD-10-CM | POA: Diagnosis present

## 2022-02-04 DIAGNOSIS — M25511 Pain in right shoulder: Secondary | ICD-10-CM | POA: Diagnosis present

## 2022-02-04 DIAGNOSIS — U071 COVID-19: Secondary | ICD-10-CM | POA: Diagnosis present

## 2022-02-04 DIAGNOSIS — Z800 Family history of malignant neoplasm of digestive organs: Secondary | ICD-10-CM

## 2022-02-04 DIAGNOSIS — D61810 Antineoplastic chemotherapy induced pancytopenia: Secondary | ICD-10-CM | POA: Diagnosis present

## 2022-02-04 DIAGNOSIS — R109 Unspecified abdominal pain: Secondary | ICD-10-CM

## 2022-02-04 DIAGNOSIS — K649 Unspecified hemorrhoids: Secondary | ICD-10-CM | POA: Diagnosis present

## 2022-02-04 DIAGNOSIS — C787 Secondary malignant neoplasm of liver and intrahepatic bile duct: Secondary | ICD-10-CM | POA: Diagnosis present

## 2022-02-04 LAB — RESP PANEL BY RT-PCR (FLU A&B, COVID) ARPGX2
Influenza A by PCR: NEGATIVE
Influenza B by PCR: NEGATIVE
SARS Coronavirus 2 by RT PCR: POSITIVE — AB

## 2022-02-04 LAB — COMPREHENSIVE METABOLIC PANEL
ALT: 21 U/L (ref 0–44)
AST: 19 U/L (ref 15–41)
Albumin: 2.7 g/dL — ABNORMAL LOW (ref 3.5–5.0)
Alkaline Phosphatase: 82 U/L (ref 38–126)
Anion gap: 7 (ref 5–15)
BUN: 11 mg/dL (ref 8–23)
CO2: 26 mmol/L (ref 22–32)
Calcium: 8.4 mg/dL — ABNORMAL LOW (ref 8.9–10.3)
Chloride: 102 mmol/L (ref 98–111)
Creatinine, Ser: 1.01 mg/dL — ABNORMAL HIGH (ref 0.44–1.00)
GFR, Estimated: >60 mL/min (ref >60)
Glucose, Bld: 123 mg/dL — ABNORMAL HIGH (ref 70–99)
Potassium: 3.5 mmol/L (ref 3.5–5.1)
Sodium: 135 mmol/L (ref 135–145)
Total Bilirubin: 0.2 mg/dL — ABNORMAL LOW (ref 0.3–1.2)
Total Protein: 6.2 g/dL — ABNORMAL LOW (ref 6.5–8.1)

## 2022-02-04 LAB — URINALYSIS, ROUTINE W REFLEX MICROSCOPIC
Bilirubin Urine: NEGATIVE
Glucose, UA: NEGATIVE mg/dL
Ketones, ur: 5 mg/dL — AB
Nitrite: NEGATIVE
Protein, ur: 30 mg/dL — AB
Specific Gravity, Urine: 1.029 (ref 1.005–1.030)
pH: 5 (ref 5.0–8.0)

## 2022-02-04 LAB — CBC
HCT: 25.4 % — ABNORMAL LOW (ref 36.0–46.0)
Hemoglobin: 8.4 g/dL — ABNORMAL LOW (ref 12.0–15.0)
MCH: 29.5 pg (ref 26.0–34.0)
MCHC: 33.1 g/dL (ref 30.0–36.0)
MCV: 89.1 fL (ref 80.0–100.0)
Platelets: 79 10*3/uL — ABNORMAL LOW (ref 150–400)
RBC: 2.85 MIL/uL — ABNORMAL LOW (ref 3.87–5.11)
RDW: 14.5 % (ref 11.5–15.5)
WBC: 4.7 10*3/uL (ref 4.0–10.5)
nRBC: 0 % (ref 0.0–0.2)

## 2022-02-04 LAB — TROPONIN I (HIGH SENSITIVITY)
Troponin I (High Sensitivity): 65 ng/L — ABNORMAL HIGH (ref <18)
Troponin I (High Sensitivity): 67 ng/L — ABNORMAL HIGH (ref <18)

## 2022-02-04 LAB — LACTIC ACID, PLASMA: Lactic Acid, Venous: 0.9 mmol/L (ref 0.5–1.9)

## 2022-02-04 LAB — LIPASE, BLOOD: Lipase: 18 U/L (ref 11–51)

## 2022-02-04 MED ORDER — ZINC OXIDE 40 % EX OINT
TOPICAL_OINTMENT | Freq: Two times a day (BID) | CUTANEOUS | Status: DC
  Administered 2022-02-06 – 2022-02-14 (×5): 1 via TOPICAL
  Filled 2022-02-04 (×2): qty 57

## 2022-02-04 MED ORDER — DOCUSATE SODIUM 100 MG PO CAPS: 100.0000 mg | ORAL_CAPSULE | Freq: Every day | ORAL | Status: DC | PRN

## 2022-02-04 MED ORDER — ACETAMINOPHEN 650 MG RE SUPP: 650.0000 mg | Freq: Four times a day (QID) | RECTAL | Status: DC | PRN

## 2022-02-04 MED ORDER — PIPERACILLIN-TAZOBACTAM 3.375 G IVPB
3.3750 g | Freq: Three times a day (TID) | INTRAVENOUS | Status: DC
  Administered 2022-02-05 – 2022-02-06 (×5): 3.375 g via INTRAVENOUS
  Filled 2022-02-04 (×6): qty 50

## 2022-02-04 MED ORDER — CALCIUM CARBONATE ANTACID 500 MG PO CHEW
1.0000 | CHEWABLE_TABLET | Freq: Every day | ORAL | Status: DC
  Administered 2022-02-05 – 2022-02-18 (×14): 200 mg via ORAL
  Filled 2022-02-04 (×13): qty 1

## 2022-02-04 MED ORDER — METOPROLOL TARTRATE 50 MG PO TABS
100.0000 mg | ORAL_TABLET | Freq: Two times a day (BID) | ORAL | Status: DC
Start: 2022-02-05 — End: 2022-02-18
  Administered 2022-02-05 – 2022-02-18 (×28): 100 mg via ORAL
  Filled 2022-02-04 (×6): qty 2
  Filled 2022-02-04: qty 4
  Filled 2022-02-04 (×9): qty 2
  Filled 2022-02-04: qty 4
  Filled 2022-02-04 (×11): qty 2

## 2022-02-04 MED ORDER — ZINC SULFATE 220 (50 ZN) MG PO CAPS
220.0000 mg | ORAL_CAPSULE | Freq: Every day | ORAL | Status: DC
  Administered 2022-02-05 – 2022-02-07 (×3): 220 mg via ORAL
  Filled 2022-02-04 (×3): qty 1

## 2022-02-04 MED ORDER — METRONIDAZOLE 500 MG/100ML IV SOLN
500.0000 mg | Freq: Two times a day (BID) | INTRAVENOUS | Status: DC
  Administered 2022-02-04: 500 mg via INTRAVENOUS
  Filled 2022-02-04: qty 100

## 2022-02-04 MED ORDER — LIDOCAINE HCL URETHRAL/MUCOSAL 2 % EX GEL
1.0000 "application " | Freq: Once | CUTANEOUS | Status: AC
  Administered 2022-02-04: 1
  Filled 2022-02-04: qty 11

## 2022-02-04 MED ORDER — PIPERACILLIN-TAZOBACTAM 3.375 G IVPB 30 MIN
3.3750 g | Freq: Once | INTRAVENOUS | Status: AC
  Administered 2022-02-04: 3.375 g via INTRAVENOUS
  Filled 2022-02-04: qty 50

## 2022-02-04 MED ORDER — ASPIRIN EC 81 MG PO TBEC
81.0000 mg | DELAYED_RELEASE_TABLET | Freq: Every day | ORAL | Status: DC
  Administered 2022-02-05 – 2022-02-06 (×2): 81 mg via ORAL
  Filled 2022-02-04 (×2): qty 1

## 2022-02-04 MED ORDER — HYDRALAZINE HCL 25 MG PO TABS
25.0000 mg | ORAL_TABLET | Freq: Four times a day (QID) | ORAL | Status: DC | PRN
  Administered 2022-02-10 – 2022-02-12 (×3): 25 mg via ORAL
  Filled 2022-02-04 (×3): qty 1

## 2022-02-04 MED ORDER — ACETAMINOPHEN 325 MG PO TABS
650.0000 mg | ORAL_TABLET | Freq: Four times a day (QID) | ORAL | Status: DC | PRN
  Administered 2022-02-05 – 2022-02-13 (×4): 650 mg via ORAL
  Filled 2022-02-04 (×5): qty 2

## 2022-02-04 MED ORDER — ASCORBIC ACID 500 MG PO TABS
500.0000 mg | ORAL_TABLET | Freq: Every day | ORAL | Status: DC
  Administered 2022-02-05 – 2022-02-18 (×14): 500 mg via ORAL
  Filled 2022-02-04 (×14): qty 1

## 2022-02-04 MED ORDER — NIRMATRELVIR/RITONAVIR (PAXLOVID)TABLET
3.0000 | ORAL_TABLET | Freq: Two times a day (BID) | ORAL | Status: AC
  Administered 2022-02-04 – 2022-02-09 (×10): 3 via ORAL
  Filled 2022-02-04: qty 30

## 2022-02-04 MED ORDER — IOHEXOL 300 MG/ML  SOLN
100.0000 mL | Freq: Once | INTRAMUSCULAR | Status: AC | PRN
  Administered 2022-02-04: 100 mL via INTRAVENOUS

## 2022-02-04 MED ORDER — ONDANSETRON HCL 4 MG/2ML IJ SOLN
4.0000 mg | Freq: Four times a day (QID) | INTRAMUSCULAR | Status: DC | PRN
  Administered 2022-02-13 – 2022-02-16 (×2): 4 mg via INTRAVENOUS
  Filled 2022-02-04 (×2): qty 2

## 2022-02-04 MED ORDER — SODIUM CHLORIDE 0.9 % IV SOLN: INTRAVENOUS | Status: DC

## 2022-02-04 MED ORDER — MORPHINE SULFATE 15 MG PO TABS
15.0000 mg | ORAL_TABLET | ORAL | Status: DC | PRN
  Administered 2022-02-05 – 2022-02-17 (×17): 15 mg via ORAL
  Filled 2022-02-04 (×17): qty 1

## 2022-02-04 MED ORDER — PANTOPRAZOLE SODIUM 40 MG PO TBEC
40.0000 mg | DELAYED_RELEASE_TABLET | Freq: Every day | ORAL | Status: DC
  Administered 2022-02-05 – 2022-02-18 (×14): 40 mg via ORAL
  Filled 2022-02-04 (×14): qty 1

## 2022-02-04 MED ORDER — ONDANSETRON HCL 4 MG PO TABS: 4.0000 mg | ORAL_TABLET | Freq: Four times a day (QID) | ORAL | Status: DC | PRN

## 2022-02-04 MED ORDER — INSULIN ASPART 100 UNIT/ML IJ SOLN
0.0000 [IU] | Freq: Three times a day (TID) | INTRAMUSCULAR | Status: DC
  Administered 2022-02-06 – 2022-02-07 (×3): 2 [IU] via SUBCUTANEOUS
  Administered 2022-02-08 – 2022-02-10 (×4): 3 [IU] via SUBCUTANEOUS
  Administered 2022-02-10: 2 [IU] via SUBCUTANEOUS
  Administered 2022-02-10 – 2022-02-11 (×2): 3 [IU] via SUBCUTANEOUS
  Administered 2022-02-11: 5 [IU] via SUBCUTANEOUS
  Administered 2022-02-11: 2 [IU] via SUBCUTANEOUS
  Administered 2022-02-12 (×2): 3 [IU] via SUBCUTANEOUS
  Administered 2022-02-12: 5 [IU] via SUBCUTANEOUS
  Administered 2022-02-13 – 2022-02-15 (×9): 3 [IU] via SUBCUTANEOUS
  Administered 2022-02-16: 2 [IU] via SUBCUTANEOUS
  Administered 2022-02-16 – 2022-02-17 (×3): 3 [IU] via SUBCUTANEOUS
  Administered 2022-02-17: 5 [IU] via SUBCUTANEOUS
  Administered 2022-02-17: 3 [IU] via SUBCUTANEOUS
  Administered 2022-02-18: 5 [IU] via SUBCUTANEOUS
  Administered 2022-02-18: 3 [IU] via SUBCUTANEOUS
  Filled 2022-02-04: qty 0.15

## 2022-02-04 MED ORDER — POLYETHYLENE GLYCOL 3350 17 G PO PACK: 17.0000 g | PACK | Freq: Every day | ORAL | Status: DC | PRN

## 2022-02-04 MED ORDER — SODIUM CHLORIDE 0.9 % IV BOLUS
1000.0000 mL | Freq: Once | INTRAVENOUS | Status: AC
  Administered 2022-02-04: 1000 mL via INTRAVENOUS

## 2022-02-04 NOTE — ED Provider Notes (Signed)
°  Physical Exam  BP (!) 194/77    Pulse 86    Temp (!) 97.4 F (36.3 C) (Oral)    Resp 20    Ht 5\' 2"  (1.575 m)    Wt 97.5 kg    SpO2 (!) 89%    BMI 39.32 kg/m   Physical Exam  Procedures  Procedures  ED Course / MDM   Clinical Course as of 02/04/22 [place] Feb 04, 2022  1414 Chest x-ray interpreted by me as no acute infiltrate.  Awaiting radiology reading. [MB]  1548 I secure messaged Dr. Alvy Bimler about the patient being here.  She said she has been in the office getting daily IV fluids nausea medication and pain medicine.  She recommends admitting her to the hospital and she will see her [MB]    Clinical Course User Index [MB] Hayden Rasmussen, MD   Medical Decision Making Care assumed at 4 PM.  Patient is here with weakness and confusion.  Patient also has been having diarrhea.  Patient appears dehydrated.  Patient's labs were unremarkable in signout pending CT head and CT abdomen pelvis.  5:46 PM CT showed diverticulitis.  I ordered Zosyn and also Flagyl (to cover for C. Difficile).  Patient is COVID-positive.  Unclear what her onset of symptoms are.  Since she is weak all over, I ordered remdesivir.  Patient does not qualify for steroids as she is not hypoxic.  At this point hospitalist to admit for dehydration, diverticulitis, COVID   Amount and/or Complexity of Data Reviewed External Data Reviewed: notes. Labs: ordered. Decision-making details documented in ED Course. Radiology: ordered and independent interpretation performed. Decision-making details documented in ED Course.  Risk OTC drugs. Prescription drug management.          Drenda Freeze, MD 02/04/22 [phone]

## 2022-02-04 NOTE — ED Notes (Signed)
This tech and another tech changed and cleaned pt up placed pt in new gown and linen changed

## 2022-02-04 NOTE — Assessment & Plan Note (Addendum)
Patient is on Lipitor as an outpatient.  Held while the patient was on Paxlovid. We will resume once LFTs improved.

## 2022-02-04 NOTE — Assessment & Plan Note (Addendum)
Thrombocytopenia. Chemotherapy-induced pancytopenia. Oncology consulted appreciate assistance. Monitor for hemoglobin and platelet stability. Transfuse for hemoglobin less than 7.  S/P 1 PRBC transfusion on 2/25. Hemoglobin remained stable after transfusion. Platelet count remained stable. Per oncology okay to resume DVT prophylaxis as long as platelets remain above 50.

## 2022-02-04 NOTE — Assessment & Plan Note (Addendum)
Incidental. Asymptomatic. Chest x-ray clear.  Patient is immunocompromised secondary to known cancer on chemotherapy.  Diagnosed on 2/20 and will need isolation for 21 days. Completed Paxlovid x5 days

## 2022-02-04 NOTE — H&P (Signed)
History and Physical    Patient: [name] PPI:[id number] DOB: [birth year] DOA: 02/04/2022 DOS: the patient was seen and examined on 02/04/2022 PCP: [name], MD  Patient coming from: Home  Chief Complaint:  Chief Complaint  Patient presents with   Diarrhea    HPI: [name] is a 66 y.o. female with medical history significant of endometrial cancer on chemotherapy, hypertension, hyperlipidemia, GERD, diabetes. Patient presented secondary to being "out of it," having "loose bowels," chest pain, poor appetite. Symptoms started about 2 weeks ago, after she received her last chemo treatment. Her oncologist started given her IV fluid infusions which helped a little. She was taking nausea and pain medication which did not help. She reports some nausea and vomiting that has now improved.  Review of Systems: As mentioned in the history of present illness. All other systems reviewed and are negative. Past Medical History:  Diagnosis Date   Anginal pain ([place]) 2016   Cancer [facility])    Coronary atherosclerosis of native coronary artery, with stent to LCX in 2006 06/04/2013   Diabetes mellitus without complication ([place])    GERD (gastroesophageal reflux disease)    Heart attack ([place]) 2006   History of radiation therapy 08/16/2021   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  Dr Gery Pray   Hyperlipidemia LDL goal < 70 06/07/2013   Hypertension    Obesity    S/P coronary artery stent placement to RCA with PROMUS DES, 06/07/13, residual disease on OM1, OM2 and rPDA 06/07/2013   Past Surgical History:  Procedure Laterality Date   CARDIAC CATHETERIZATION N/A 08/22/2015   Procedure: Left Heart Cath and Coronary Angiography;  Surgeon: Leonie Man, MD;  Location: [place] CV LAB;  Service: Cardiovascular;  Laterality: N/A;   CARDIAC CATHETERIZATION N/A 08/22/2015   Procedure: Coronary Stent Intervention;  Surgeon: Leonie Man, MD;  Location: [place] CV LAB;  Service: Cardiovascular;   Laterality: N/A;   CARDIAC CATHETERIZATION N/A 08/22/2015   Procedure: Left Heart Cath and Coronary Angiography;  Surgeon: Leonie Man, MD;  Location: [place] CV LAB;  Service: Cardiovascular;  Laterality: N/A;   CORONARY STENT PLACEMENT  2006   TAXUS stent CFX in [facility]   IR IMAGING GUIDED PORT INSERTION  02/20/2021   LEFT HEART CATH AND CORONARY ANGIOGRAPHY N/A 07/21/2018   Procedure: LEFT HEART CATH AND CORONARY ANGIOGRAPHY;  Surgeon: Troy Sine, MD;  Location: [place] CV LAB;  Service: Cardiovascular;  Laterality: N/A;   LEFT HEART CATHETERIZATION WITH CORONARY ANGIOGRAM N/A 06/07/2013   Procedure: LEFT HEART CATHETERIZATION WITH CORONARY ANGIOGRAM;  Surgeon: Sanda Klein, MD;  Location: [place] CATH LAB;  Service: Cardiovascular;  Laterality: N/A;   LYMPH NODE DISSECTION N/A 01/29/2021   Procedure: SELECTIVE LYMPH NODE DISSECTION;  Surgeon: Lafonda Mosses, MD;  Location: [facility];  Service: Gynecology;  Laterality: N/A;   ROBOTIC ASSISTED TOTAL HYSTERECTOMY WITH BILATERAL SALPINGO OOPHERECTOMY Bilateral 01/29/2021   Procedure: XI ROBOTIC ASSISTED TOTAL HYSTERECTOMY WITH BILATERAL SALPINGO OOPHORECTOMY,;  Surgeon: Lafonda Mosses, MD;  Location: [facility];  Service: Gynecology;  Laterality: Bilateral;   SENTINEL NODE BIOPSY N/A 01/29/2021   Procedure: SENTINEL NODE BIOPSY;  Surgeon: Lafonda Mosses, MD;  Location: [facility];  Service: Gynecology;  Laterality: N/A;   Social History:  reports that she quit smoking about 17 years ago. Her smoking use included cigars. She has never used smokeless tobacco. She reports current alcohol use. She reports that she does not use drugs.  No Known Allergies  Family History  Problem Relation Age of Onset   Diabetes Mother    Hypertension Mother    Diabetes Sister    Diabetes Brother    Hypertension Brother    Colon cancer Father    Breast cancer Neg Hx    Ovarian cancer Neg Hx    Endometrial cancer Neg Hx    Prostate cancer Neg Hx     Pancreatic cancer Neg Hx     Prior to Admission medications   Medication Sig Start Date End Date Taking? Authorizing Provider  aspirin 81 MG EC tablet Take 1 tablet (81 mg total) by mouth daily. 07/13/21   Lavina Hamman, MD  atorvastatin (LIPITOR) 80 MG tablet TAKE 1 TABLET (80 MG TOTAL) BY MOUTH AT BEDTIME. 06/20/21 06/20/22  Mayers, Cari S, PA-C  dexamethasone (DECADRON) 4 MG tablet Take 2 tablets by mouth the night before and 2 tablets the morning of chemotherapy, every 3 weeks x 6 cycles 12/24/21   Alvy Bimler, Ni, MD  Dulaglutide (TRULICITY) 1.5 GX/2.1JH SOPN Inject 1.5 mg into the skin once a week. Patient taking differently: Inject 1.5 mg into the skin once a week. Friday/Saturday 06/20/21   Mayers, Cari S, PA-C  furosemide (LASIX) 20 MG tablet Take 2 tablets (40 mg total) by mouth daily. 01/14/22 02/14/22  [name], MD  glimepiride (AMARYL) 4 MG tablet Take 1 tablet (4 mg total) by mouth daily with breakfast. 09/20/21 09/20/22  [name], MD  isosorbide mononitrate (IMDUR) 30 MG 24 hr tablet Take 2 tablets (60 mg total) by mouth daily. 01/14/22 02/14/22  [name], MD  lisinopril (ZESTRIL) 20 MG tablet TAKE 1 TABLET BY MOUTH ONCE A DAY 01/14/22 01/14/23  [name], MD  metFORMIN (GLUCOPHAGE) 500 MG tablet Take 2 tablets (1,000 mg total) by mouth 2 (two) times daily with a meal. 01/14/22 02/14/22  [name], MD  metoprolol tartrate (LOPRESSOR) 100 MG tablet TAKE 1 TABLET (100 MG TOTAL) BY MOUTH 2 (TWO) TIMES DAILY. 01/14/22 01/14/23  [name], MD  morphine (MSIR) 15 MG tablet Take 1 tablet (15 mg total) by mouth every 4 (four) hours as needed for severe pain. 02/01/22   Heath Lark, MD  nitroGLYCERIN (NITROSTAT) 0.4 MG SL tablet Place 1 tablet (0.4 mg total) under the tongue every 5 (five) minutes as needed for chest pain. Patient not taking: Reported on 01/08/2022 08/24/15   Erlene Quan, PA-C  ondansetron (ZOFRAN) 8 MG tablet Take 1 tablet by mouth 2  times daily as needed  for refractory nausea / vomiting. Start on day 3 after carboplatin chemo. 12/24/21   Heath Lark, MD  pantoprazole (PROTONIX) 40 MG tablet Take 1 tablet (40 mg total) by mouth daily. 01/24/22 02/23/22  Heath Lark, MD  prochlorperazine (COMPAZINE) 10 MG tablet Take 1 tablet by mouth every 6  hours as needed (Nausea or vomiting). 12/24/21   Heath Lark, MD    Physical Exam: Vitals:   02/04/22 1600 02/04/22 1700 02/04/22 1730 02/04/22 1745  BP: (!) 156/79 (!) 194/77 (!) 153/81 (!) 160/78  Pulse: 73 86 74 78  Resp: 15 20 19  (!) 7  Temp:      TempSrc:      SpO2: 100% (!) 89% 99% 92%  Weight:      Height:       General exam: Appears calm and comfortable and in no acute distress. Conversant HEENT: oral mucosa is moist Respiratory: Clear to auscultation. Respiratory effort normal with no intercostal retractions or use of accessory  muscles Cardiovascular: S1 & S2 heard, RRR. No murmurs, rubs, gallops or clicks. No edema Gastrointestinal: Abdomen is non-distended, soft and non-tender. No masses felt. Normal bowel sounds heard Neurologic: No focal neurological deficits Musculoskeletal: No calf tenderness Skin: No cyanosis. No new rashes Psychiatry: Alert and oriented x3. Memory intact. Mood & affect appropriate  Data Reviewed:  BMP significant for a creatinine of 1.01 and albumin of 2.7 CBC significant for hemoglobin of 8.4 and platelets of 79,000 Troponin of 65 > 67  Assessment and Plan: * Acute diverticulitis Mild on imaging. Located in sigmoid colon. Patient given Zosyn IV empirically in the ED. Blood cultures obtained. -Continue Zosyn IV for now  Thrombocytopenia ([place]) Possibly related to chemotherapy. Patient is just under two weeks out from last treatment. -CBC daily  COVID-19 virus infection Incidental. Asymptomatic. Chest x-ray clear. Patient is immunocompromised secondary to known cancer on chemotherapy. Diagnosed on 2/20 and will need isolation for 21 days. -Paxlovid x5  days -Daily CBC, CMP, CRP, D-dimer  Acute diarrhea Patient with a two week persistent course of diarrhea. Associated incontinence. Watery stools. Family states they were told it is not secondary to recently received chemotherapy. Afebrile and no leukocytosis. Immune compromised. -Check C. Difficile and GI pathogen panel -IV fluids  Anemia due to antineoplastic chemotherapy- (present on admission) Baseline hemoglobin is around 10. Slightly lower at 8.4 today. No evidence of bleeding. Asymptomatic. -CBC in AM  Hypertension- (present on admission) Patient is on Imdur, lisinopril and metoprolol as an outpatient. Blood pressure uncontrolled currently -Restart home metoprolol to prevent rebound tachycardia -Hold lisinopril while having significant diarrhea. Hold Imdur for now and restart if blood pressure still uncontrolled  Chronic kidney disease, stage 3a ([place])- (present on admission) Stable.  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 Alvy Bimler and is currently receiving chemotherapy. Last treatment on 01/22/22. Dr. Alvy Bimler added to treatment team.  Chronic diastolic heart failure ([place])- (present on admission) No evidence of exacerbation. History of normal LVEF with grade 1 diastolic dysfunction. Patient is on lasix as an outpatient -Hold Lasix while having diarrhea/increased fluid losses  Dyslipidemia- (present on admission) Patient is on Lipitor as an outpatient. -Hold Lipitor while on Paxlovid  Obesity (BMI 30-39.9)- (present on admission) Body mass index is 39.32 kg/m.  Uncontrolled type 2 diabetes mellitus with hyperglycemia, without long-term current use of insulin ([place])- (present on admission) Patient is on Trulicity, glimepiride and metformin as an outpatient. Last hemoglobin A1C of 7.9%. Uncontrolled. -Hold home regimen -SSI while inpatient  Decreased oral intake Possibly related to acute illness vs cancer therapy.  No physical impediments. -Dietitician  Right shoulder pain Likely related to fall. Good passive range of motion but poor active strength. -Shoulder x-ray -OT eval     Advance Care Planning: Full Code  Consults: Hematology/Oncology  Family Communication: Daughter at bedside   Author: Cordelia Poche, MD 02/04/2022 5:52 PM  For on call review [email or web address].

## 2022-02-04 NOTE — Assessment & Plan Note (Addendum)
No evidence of exacerbation. History of normal LVEF with grade 1 diastolic dysfunction.  Patient is on lasix as an outpatient, resume

## 2022-02-04 NOTE — Progress Notes (Signed)
Pharmacy Antibiotic Note  [name] is a 66 y.o. female admitted on 02/04/2022 with  intra-abdominal infection .  Pharmacy has been consulted for Zosyn dosing.  Plan: Zosyn 3.375g IV q8h (4 hour infusion). No dose adjustments anticipated.  Pharmacy will sign off and monitor peripherally via electronic surveillance software for any changes in renal function or micro data.   Height: 5\' 2"  (157.5 cm) Weight: 97.5 kg (215 lb) IBW/kg (Calculated) : 50.1  Temp (24hrs), Avg:97.4 F (36.3 C), Min:97.4 F (36.3 C), Max:97.4 F (36.3 C)  Recent Labs  Lab 01/31/22 1230 02/04/22 1341 02/04/22 1717  WBC 2.8* 4.7  --   CREATININE 1.17* 1.01*  --   LATICACIDVEN  --   --  0.9    Estimated Creatinine Clearance: 62.2 mL/min (A) (by C-G formula based on SCr of 1.01 mg/dL (H)).    No Known Allergies  Antimicrobials this admission: 2/20 Zosyn >>   Dose adjustments this admission:  Microbiology results: 2/20 BCx:  2/20 Resp PCR: COVID +  Thank you for allowing pharmacy to be a part of this patients care.  Netta Cedars PharmD 02/04/2022 7:27 PM

## 2022-02-04 NOTE — ED Triage Notes (Addendum)
BIB EMS Ca pt with Uterine Ca, diarrhea and unable to eat x 4 days. Fell 4 days ago, anterior chest wall pain. 130/60-90-98% CBG 170   Pt does not recall when she fell, states she fell twice.

## 2022-02-04 NOTE — Assessment & Plan Note (Addendum)
Patient is on Imdur, lisinopril and metoprolol as an outpatient.  Blood pressure significantly uncontrolled. Continue lisinopril, metoprolol, Imdur, Lasix, Norvasc and hydralazine as needed.

## 2022-02-04 NOTE — Telephone Encounter (Signed)
Called to follow up on not showing up for 8 am infusion. [name] her daughter is crying on the phone and upset. Keena fell x 2 on Saturday. She was having some constipation and now she is having bowel incontinence. She is not even aware that she is having a bm per [place]. Sumayyah is having confusion now. She has not been out of the bed since Saturday and is just laying in the bed sleeping. She is no longer taking pain medication and has not ate anything since Saturday.  Instructed per Dr. Alvy Bimler, call 911 or go to ED to be evaluated now. [name] verbalized understanding.

## 2022-02-04 NOTE — Assessment & Plan Note (Deleted)
Patient with a two week persistent course of diarrhea. Associated incontinence. Watery stools. Family states they were told it is not secondary to recently received chemotherapy. Afebrile and no leukocytosis. Immune compromised. Diarrhea now seems to be improving. C. Difficile collection attempted but failed secondary to inappropriate stool consistency. GI pathogen panel pending -IV fluids

## 2022-02-04 NOTE — Assessment & Plan Note (Addendum)
Patient follows with Dr. Alvy Bimler and is currently receiving chemotherapy. Last treatment on 01/22/22. Appreciate Dr. Alvy Bimler assistance. Patient reported 1 episode of vaginal bleeding on 2/22.  No further bleeding reported after that.

## 2022-02-04 NOTE — Assessment & Plan Note (Addendum)
CT scan shows mild sigmoid colon diverticulitis. Started on IV Zosyn. Later on transition to oral Augmentin.  Now on Omnicef Flagyl.  Last day of antibiotic 3/1. Cultures so far negative. GI pathogen panel and C. difficile negative. Treated with Questran and Imodium.  GI consulted.  Outpatient colonoscopy recommended.

## 2022-02-04 NOTE — ED Notes (Signed)
Pt's pain has improved. She is resting and comfortable.

## 2022-02-04 NOTE — Assessment & Plan Note (Deleted)
Possibly related to acute illness vs cancer therapy. No physical impediments. -Dietitician

## 2022-02-04 NOTE — ED Provider Notes (Addendum)
[place] [facility] DEPT Provider Note   CSN: [id number] Arrival date & time: 02/04/22  1243     History  Chief Complaint  Patient presents with   Diarrhea    [name] is a 66 y.o. female.  Patient brought in from home by ambulance for evaluation of diarrhea and poor intake.  She has a history of uterine cancer follows with Dr. Simeon Craft such and is active on chemotherapy.  She had a fall 2 days ago x2.  Patient complaining some pain in her rectum from diarrhea.  Daughter said diarrhea has been profuse and incontinent over the last few days.  No fevers.  Recently switched from oxycodone to morphine.  Increased confusion and decreased appetite and intake.  More difficulty with ambulation  The history is provided by the patient.  Diarrhea Quality:  Watery Severity:  Moderate Onset quality:  Gradual Duration:  2 days Timing:  Intermittent Progression:  Unchanged Relieved by:  None tried Worsened by:  Nothing Ineffective treatments:  None tried Associated symptoms: abdominal pain   Associated symptoms: no recent cough, no fever, no headaches and no vomiting       Home Medications Prior to Admission medications   Medication Sig Start Date End Date Taking? Authorizing Provider  aspirin 81 MG EC tablet Take 1 tablet (81 mg total) by mouth daily. 07/13/21   Lavina Hamman, MD  atorvastatin (LIPITOR) 80 MG tablet TAKE 1 TABLET (80 MG TOTAL) BY MOUTH AT BEDTIME. 06/20/21 06/20/22  Mayers, Cari S, PA-C  dexamethasone (DECADRON) 4 MG tablet Take 2 tablets by mouth the night before and 2 tablets the morning of chemotherapy, every 3 weeks x 6 cycles 12/24/21   Alvy Bimler, Ni, MD  Dulaglutide (TRULICITY) 1.5 IE/3.3IR SOPN Inject 1.5 mg into the skin once a week. Patient taking differently: Inject 1.5 mg into the skin once a week. Friday/Saturday 06/20/21   Mayers, Cari S, PA-C  furosemide (LASIX) 20 MG tablet Take 2 tablets (40 mg total) by mouth daily. 01/14/22 02/14/22  [name], MD  glimepiride (AMARYL) 4 MG tablet Take 1 tablet (4 mg total) by mouth daily with breakfast. 09/20/21 09/20/22  [name], MD  isosorbide mononitrate (IMDUR) 30 MG 24 hr tablet Take 2 tablets (60 mg total) by mouth daily. 01/14/22 02/14/22  [name], MD  lisinopril (ZESTRIL) 20 MG tablet TAKE 1 TABLET BY MOUTH ONCE A DAY 01/14/22 01/14/23  [name], MD  metFORMIN (GLUCOPHAGE) 500 MG tablet Take 2 tablets (1,000 mg total) by mouth 2 (two) times daily with a meal. 01/14/22 02/14/22  [name], MD  metoprolol tartrate (LOPRESSOR) 100 MG tablet TAKE 1 TABLET (100 MG TOTAL) BY MOUTH 2 (TWO) TIMES DAILY. 01/14/22 01/14/23  [name], MD  morphine (MSIR) 15 MG tablet Take 1 tablet (15 mg total) by mouth every 4 (four) hours as needed for severe pain. 02/01/22   Heath Lark, MD  nitroGLYCERIN (NITROSTAT) 0.4 MG SL tablet Place 1 tablet (0.4 mg total) under the tongue every 5 (five) minutes as needed for chest pain. Patient not taking: Reported on 01/08/2022 08/24/15   Erlene Quan, PA-C  ondansetron (ZOFRAN) 8 MG tablet Take 1 tablet by mouth 2  times daily as needed for refractory nausea / vomiting. Start on day 3 after carboplatin chemo. 12/24/21   Heath Lark, MD  pantoprazole (PROTONIX) 40 MG tablet Take 1 tablet (40 mg total) by mouth daily. 01/24/22 02/23/22  Heath Lark, MD  prochlorperazine (COMPAZINE) 10 MG  tablet Take 1 tablet by mouth every 6  hours as needed (Nausea or vomiting). 12/24/21   Heath Lark, MD      Allergies    Patient has no known allergies.    Review of Systems   Review of Systems  Constitutional:  Positive for fatigue. Negative for fever.  HENT:  Negative for sore throat.   Respiratory:  Negative for cough.   Cardiovascular:  Positive for chest pain.  Gastrointestinal:  Positive for abdominal pain, diarrhea and rectal pain. Negative for vomiting.  Genitourinary:  Negative for dysuria.  Musculoskeletal:  Positive for gait problem.  Skin:  Negative  for rash.  Neurological:  Negative for headaches.   Physical Exam Updated Vital Signs BP 124/82 (BP Location: Left Arm)    Pulse 82    Temp (!) 97.4 F (36.3 C) (Oral)    Resp 18    Ht 5\' 2"  (1.575 m)    Wt 97.5 kg    SpO2 92%    BMI 39.32 kg/m  Physical Exam Vitals and nursing note reviewed.  Constitutional:      General: She is not in acute distress.    Appearance: Normal appearance. She is well-developed.  HENT:     Head: Normocephalic and atraumatic.  Eyes:     Conjunctiva/sclera: Conjunctivae normal.  Cardiovascular:     Rate and Rhythm: Normal rate and regular rhythm.     Heart sounds: No murmur heard. Pulmonary:     Effort: Pulmonary effort is normal. No respiratory distress.     Breath sounds: Normal breath sounds.  Abdominal:     Palpations: Abdomen is soft.     Tenderness: There is no abdominal tenderness.  Musculoskeletal:        General: No swelling.     Cervical back: Neck supple.  Skin:    General: Skin is warm and dry.     Capillary Refill: Capillary refill takes less than 2 seconds.  Neurological:     General: No focal deficit present.     Mental Status: She is alert.    ED Results / Procedures / Treatments   Labs (all labs ordered are listed, but only abnormal results are displayed) Labs Reviewed  RESP PANEL BY RT-PCR (FLU A&B, COVID) ARPGX2 - Abnormal; Notable for the following components:      Result Value   SARS Coronavirus 2 by RT PCR POSITIVE (*)    All other components within normal limits  COMPREHENSIVE METABOLIC PANEL - Abnormal; Notable for the following components:   Glucose, Bld 123 (*)    Creatinine, Ser 1.01 (*)    Calcium 8.4 (*)    Total Protein 6.2 (*)    Albumin 2.7 (*)    Total Bilirubin 0.2 (*)    All other components within normal limits  CBC - Abnormal; Notable for the following components:   RBC 2.85 (*)    Hemoglobin 8.4 (*)    HCT 25.4 (*)    Platelets 79 (*)    All other components within normal limits  TROPONIN I  (HIGH SENSITIVITY) - Abnormal; Notable for the following components:   Troponin I (High Sensitivity) 65 (*)    All other components within normal limits  TROPONIN I (HIGH SENSITIVITY) - Abnormal; Notable for the following components:   Troponin I (High Sensitivity) 67 (*)    All other components within normal limits  GASTROINTESTINAL PANEL BY PCR, STOOL (REPLACES STOOL CULTURE)  C DIFFICILE QUICK SCREEN W PCR REFLEX  CULTURE, BLOOD (ROUTINE X 2)  CULTURE, BLOOD (ROUTINE X 2)  LIPASE, BLOOD  LACTIC ACID, PLASMA  URINALYSIS, ROUTINE W REFLEX MICROSCOPIC  LACTIC ACID, PLASMA    EKG EKG Interpretation  Date/Time:  Monday February 04 2022 13:35:17 EST Ventricular Rate:  77 PR Interval:  135 QRS Duration: 85 QT Interval:  372 QTC Calculation: 421 R Axis:   70 Text Interpretation: Sinus rhythm no acute ST, Ts No significant change since prior 1/23 Confirmed by Aletta Edouard [phone]) on 02/04/2022 3:25:23 PM  Radiology DG Shoulder Right  Result Date: 02/04/2022 CLINICAL DATA:  Uterine cancer.  Diarrhea.  Right shoulder pain. EXAM: RIGHT SHOULDER - 2+ VIEW COMPARISON:  None. FINDINGS: There is diffuse decreased bone mineralization. Mild acromioclavicular joint space narrowing with mild inferior degenerative osteophytes. Mild inferior glenoid degenerative osteophytosis. No acute fracture is seen. No dislocation. Partial visualization of right chest port a catheter. 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 Electronically Signed   By: Yvonne Kendall M.D.   On: 02/04/2022 19:08   CT Head Wo Contrast  Result Date: 02/04/2022 CLINICAL DATA:  Mental status change, unknown cause EXAM: CT HEAD WITHOUT CONTRAST TECHNIQUE: Contiguous axial images were obtained from the base of the skull through the vertex without intravenous contrast. RADIATION DOSE REDUCTION: This exam was performed according to the departmental dose-optimization program which includes automated  exposure control, adjustment of the mA and/or kV according to patient size and/or use of iterative reconstruction technique. COMPARISON:  None. FINDINGS: Brain: There is no acute intracranial hemorrhage, mass effect, or edema. Gray-white differentiation is preserved. Small focus of low density in the white matter adjacent to the posterior body of the left lateral ventricle. There is no extra-axial fluid collection. Ventricles and sulci are within normal limits in size and configuration. Vascular: There is atherosclerotic calcification at the skull base. Skull: Calvarium is unremarkable. Sinuses/Orbits: No acute finding. Other: None. IMPRESSION: No acute intracranial hemorrhage. 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 Electronically Signed   By: Macy Mis M.D.   On: 02/04/2022 16:55   CT Abdomen Pelvis W Contrast  Result Date: 02/04/2022 CLINICAL DATA:  Abdominal pain.  History of uterine cancer EXAM: CT ABDOMEN AND PELVIS WITH CONTRAST TECHNIQUE: Multidetector CT imaging of the abdomen and pelvis was performed using the standard protocol following bolus administration of intravenous contrast. RADIATION DOSE REDUCTION: This exam was performed according to the departmental dose-optimization program which includes automated exposure control, adjustment of the mA and/or kV according to patient size and/or use of iterative reconstruction technique. CONTRAST:  126mL OMNIPAQUE IOHEXOL 300 MG/ML  SOLN COMPARISON:  01/28/2022 FINDINGS: Lower chest: Included lung bases are clear. Heart size within normal limits. Hepatobiliary: No focal liver abnormality is seen. No gallstones, gallbladder wall thickening, or biliary dilatation. Pancreas: Unremarkable. No pancreatic ductal dilatation or surrounding inflammatory changes. Spleen: Normal in size without focal abnormality. Adrenals/Urinary Tract: Unremarkable adrenal glands. Kidneys enhance  symmetrically without focal lesion, stone, or hydronephrosis. Ureters are nondilated. Urinary bladder appears unremarkable. Stomach/Bowel: Colonic diverticulosis with new mild pericolonic fat stranding adjacent to a prominent diverticula at the proximal sigmoid colon where there is short segment bowel thickening (series 5, images 66-68). Scattered diverticulosis within the remaining sigmoid and descending colon. No additional sites of focal inflammation. Normal appendix in the right lower quadrant. No dilated loops of bowel. Stomach within normal limits. Vascular/Lymphatic: Scattered atherosclerotic calcifications throughout the aortoiliac axis. Stable mildly enlarged left pelvic sidewall lymph node measuring  approximately 1.3 cm short axis (series 5, image 74). Reproductive: Prior hysterectomy. Stable prominence at the vaginal cuff, indeterminate. No adnexal masses. Other: Mild presacral fluid/edema. No organized abdominopelvic fluid collection. No pneumoperitoneum. Musculoskeletal: No new or acute bony findings. IMPRESSION: 1. Acute uncomplicated sigmoid diverticulitis. 2. Prior hysterectomy with stable prominence at the vaginal cuff, indeterminate. Stable mildly enlarged left pelvic sidewall lymph node. Aortic Atherosclerosis (ICD10-I70.0). Electronically Signed   By: Davina Poke D.O.   On: 02/04/2022 16:58   DG Chest Port 1 View  Result Date: 02/04/2022 CLINICAL DATA:  History of uterine cancer. Diarrhea. Unable to eat for 4 days. EXAM: PORTABLE CHEST 1 VIEW COMPARISON:  January 28, 2022 FINDINGS: The heart size and mediastinal contours are stable. Right central venous line is unchanged. Both lungs are clear. The visualized skeletal structures are unremarkable. IMPRESSION: No active disease. Electronically Signed   By: Abelardo Diesel M.D.   On: 02/04/2022 14:20    Procedures Procedures    Medications Ordered in ED Medications  liver oil-zinc oxide (DESITIN) 40 % ointment (has no administration  in time range)  nirmatrelvir/ritonavir EUA (PAXLOVID) 3 tablet (has no administration in time range)  0.9 %  sodium chloride infusion (has no administration in time range)  sodium chloride 0.9 % bolus 1,000 mL (0 mLs Intravenous Stopped 02/04/22 1523)  iohexol (OMNIPAQUE) 300 MG/ML solution 100 mL (100 mLs Intravenous Contrast Given 02/04/22 1635)  piperacillin-tazobactam (ZOSYN) IVPB 3.375 g (0 g Intravenous Stopped 02/04/22 1840)    ED Course/ Medical Decision Making/ A&P Clinical Course as of 02/04/22 1913  Mon Feb 04, 2022  1414 Chest x-ray interpreted by me as no acute infiltrate.  Awaiting radiology reading. [MB]  1548 I secure messaged Dr. Alvy Bimler about the patient being here.  She said she has been in the office getting daily IV fluids nausea medication and pain medicine.  She recommends admitting her to the hospital and she will see her [MB]    Clinical Course User Index [MB] Hayden Rasmussen, MD                           Medical Decision Making Amount and/or Complexity of Data Reviewed Labs: ordered. Radiology: ordered.  Risk OTC drugs. Prescription drug management. Decision regarding hospitalization.  TESHARA MOREE was evaluated in Emergency Department on 02/04/2022 for the symptoms described in the history of present illness. She was evaluated in the context of the global COVID-19 pandemic, which necessitated consideration that the patient might be at risk for infection with the SARS-CoV-2 virus that causes COVID-19. Institutional protocols and algorithms that pertain to the evaluation of patients at risk for COVID-19 are in a state of rapid change based on information released by regulatory bodies including the CDC and federal and state organizations. These policies and algorithms were followed during the patient's care in the ED.  This patient complains of diarrhea weakness falls chest pain; this involves an extensive number of treatment Options and is a complaint that  carries with it a high risk of complications and morbidity. The differential includes failure to thrive, dehydration, chemotherapy reaction, COVID, flu, intestinal infection, anemia, metabolic derangement, sepsis  I ordered, reviewed and interpreted labs, which included CBC with normal white count, hemoglobin lower than priors, platelets low likely secondary to chemotherapy, chemistries fairly unremarkable, COVID-positive, troponins elevated need to be trended, lactate not elevated, blood culture sent, stool studies ordered and not obtained I ordered medication IV fluids and reviewed  PMP when indicated. I ordered imaging studies which included chest x-ray, CT head and abdomen and I independently    visualized and interpreted imaging which showed acute diverticulitis Additional history obtained from patient's daughter Previous records obtained and reviewed in epic including prior oncology notes I consulted Dr. Alvy Bimler oncology and discussed lab and imaging findings and discussed disposition.  Cardiac monitoring reviewed, patient in normal sinus rhythm Social determinants considered, no significant barriers Critical Interventions: None  After the interventions stated above, I reevaluated the patient and found patient to be fairly asymptomatic at rest. Admission and further testing considered, patient will need to be admitted to the hospital.  Her care is signed out to oncoming provider Dr. Darl Householder to follow-up on results of CT head and abdomen.  Plan is for admission to the hospital for hydration and further management of her symptoms.          Final Clinical Impression(s) / ED Diagnoses Final diagnoses:  Diverticulitis  COVID  Malignant neoplasm of uterus, unspecified site [facility])  Diarrhea, unspecified type    Rx / DC Orders ED Discharge Orders     None         Hayden Rasmussen, MD 02/04/22 1917    Hayden Rasmussen, MD 02/20/22 1018

## 2022-02-04 NOTE — Assessment & Plan Note (Addendum)
Baseline serum creatinine around 1.1. Currently serum creatinine normal. Remaining stable.

## 2022-02-04 NOTE — Assessment & Plan Note (Addendum)
Body mass index is 39.32 kg/m.  Placing the patient at high risk of poor outcome.

## 2022-02-04 NOTE — Assessment & Plan Note (Signed)
Patient is on Trulicity, glimepiride and metformin as an outpatient. Last hemoglobin A1C of 7.9%. Uncontrolled. -Hold home regimen -SSI while inpatient

## 2022-02-04 NOTE — Assessment & Plan Note (Deleted)
Possibly related to chemotherapy. Patient is just under two weeks out from last treatment. -CBC daily

## 2022-02-04 NOTE — Assessment & Plan Note (Addendum)
Shoulder XR negative. Monitor.

## 2022-02-05 ENCOUNTER — Telehealth: Payer: Self-pay

## 2022-02-05 DIAGNOSIS — I2510 Atherosclerotic heart disease of native coronary artery without angina pectoris: Secondary | ICD-10-CM | POA: Diagnosis present

## 2022-02-05 DIAGNOSIS — Z6839 Body mass index (BMI) 39.0-39.9, adult: Secondary | ICD-10-CM | POA: Diagnosis not present

## 2022-02-05 DIAGNOSIS — R7989 Other specified abnormal findings of blood chemistry: Secondary | ICD-10-CM | POA: Diagnosis not present

## 2022-02-05 DIAGNOSIS — N1831 Chronic kidney disease, stage 3a: Secondary | ICD-10-CM | POA: Diagnosis present

## 2022-02-05 DIAGNOSIS — R197 Diarrhea, unspecified: Secondary | ICD-10-CM | POA: Diagnosis not present

## 2022-02-05 DIAGNOSIS — D61810 Antineoplastic chemotherapy induced pancytopenia: Secondary | ICD-10-CM | POA: Diagnosis present

## 2022-02-05 DIAGNOSIS — K719 Toxic liver disease, unspecified: Secondary | ICD-10-CM | POA: Diagnosis present

## 2022-02-05 DIAGNOSIS — T451X5A Adverse effect of antineoplastic and immunosuppressive drugs, initial encounter: Secondary | ICD-10-CM | POA: Diagnosis present

## 2022-02-05 DIAGNOSIS — E43 Unspecified severe protein-calorie malnutrition: Secondary | ICD-10-CM | POA: Diagnosis present

## 2022-02-05 DIAGNOSIS — K219 Gastro-esophageal reflux disease without esophagitis: Secondary | ICD-10-CM | POA: Diagnosis present

## 2022-02-05 DIAGNOSIS — I5032 Chronic diastolic (congestive) heart failure: Secondary | ICD-10-CM | POA: Diagnosis present

## 2022-02-05 DIAGNOSIS — E1165 Type 2 diabetes mellitus with hyperglycemia: Secondary | ICD-10-CM | POA: Diagnosis present

## 2022-02-05 DIAGNOSIS — I252 Old myocardial infarction: Secondary | ICD-10-CM | POA: Diagnosis not present

## 2022-02-05 DIAGNOSIS — R609 Edema, unspecified: Secondary | ICD-10-CM | POA: Diagnosis not present

## 2022-02-05 DIAGNOSIS — D6959 Other secondary thrombocytopenia: Secondary | ICD-10-CM | POA: Diagnosis present

## 2022-02-05 DIAGNOSIS — K5792 Diverticulitis of intestine, part unspecified, without perforation or abscess without bleeding: Secondary | ICD-10-CM | POA: Diagnosis present

## 2022-02-05 DIAGNOSIS — E860 Dehydration: Secondary | ICD-10-CM | POA: Diagnosis present

## 2022-02-05 DIAGNOSIS — D849 Immunodeficiency, unspecified: Secondary | ICD-10-CM | POA: Diagnosis present

## 2022-02-05 DIAGNOSIS — U071 COVID-19: Secondary | ICD-10-CM | POA: Diagnosis present

## 2022-02-05 DIAGNOSIS — E1122 Type 2 diabetes mellitus with diabetic chronic kidney disease: Secondary | ICD-10-CM | POA: Diagnosis present

## 2022-02-05 DIAGNOSIS — E669 Obesity, unspecified: Secondary | ICD-10-CM | POA: Diagnosis present

## 2022-02-05 DIAGNOSIS — D6481 Anemia due to antineoplastic chemotherapy: Secondary | ICD-10-CM | POA: Diagnosis present

## 2022-02-05 DIAGNOSIS — Y929 Unspecified place or not applicable: Secondary | ICD-10-CM | POA: Diagnosis not present

## 2022-02-05 DIAGNOSIS — R778 Other specified abnormalities of plasma proteins: Secondary | ICD-10-CM | POA: Diagnosis not present

## 2022-02-05 DIAGNOSIS — E785 Hyperlipidemia, unspecified: Secondary | ICD-10-CM | POA: Diagnosis present

## 2022-02-05 DIAGNOSIS — C541 Malignant neoplasm of endometrium: Secondary | ICD-10-CM | POA: Diagnosis present

## 2022-02-05 DIAGNOSIS — I130 Hypertensive heart and chronic kidney disease with heart failure and stage 1 through stage 4 chronic kidney disease, or unspecified chronic kidney disease: Secondary | ICD-10-CM | POA: Diagnosis present

## 2022-02-05 DIAGNOSIS — K5732 Diverticulitis of large intestine without perforation or abscess without bleeding: Secondary | ICD-10-CM | POA: Diagnosis present

## 2022-02-05 DIAGNOSIS — C787 Secondary malignant neoplasm of liver and intrahepatic bile duct: Secondary | ICD-10-CM | POA: Diagnosis present

## 2022-02-05 DIAGNOSIS — W19XXXA Unspecified fall, initial encounter: Secondary | ICD-10-CM | POA: Diagnosis present

## 2022-02-05 DIAGNOSIS — K649 Unspecified hemorrhoids: Secondary | ICD-10-CM | POA: Diagnosis present

## 2022-02-05 LAB — GLUCOSE, CAPILLARY
Glucose-Capillary: 119 mg/dL — ABNORMAL HIGH (ref 70–99)
Glucose-Capillary: 108 mg/dL — ABNORMAL HIGH (ref 70–99)

## 2022-02-05 LAB — COMPREHENSIVE METABOLIC PANEL
ALT: 19 U/L (ref 0–44)
AST: 18 U/L (ref 15–41)
Albumin: 2.5 g/dL — ABNORMAL LOW (ref 3.5–5.0)
Alkaline Phosphatase: 79 U/L (ref 38–126)
Anion gap: 7 (ref 5–15)
BUN: 9 mg/dL (ref 8–23)
CO2: 23 mmol/L (ref 22–32)
Calcium: 8.1 mg/dL — ABNORMAL LOW (ref 8.9–10.3)
Chloride: 105 mmol/L (ref 98–111)
Creatinine, Ser: 1 mg/dL (ref 0.44–1.00)
GFR, Estimated: >60 mL/min (ref >60)
Glucose, Bld: 83 mg/dL (ref 70–99)
Potassium: 3.4 mmol/L — ABNORMAL LOW (ref 3.5–5.1)
Sodium: 135 mmol/L (ref 135–145)
Total Bilirubin: 0.4 mg/dL (ref 0.3–1.2)
Total Protein: 6 g/dL — ABNORMAL LOW (ref 6.5–8.1)

## 2022-02-05 LAB — CBC WITH DIFFERENTIAL/PLATELET
Abs Immature Granulocytes: 0.06 10*3/uL (ref 0.00–0.07)
Basophils Absolute: 0 10*3/uL (ref 0.0–0.1)
Basophils Relative: 0 %
Eosinophils Absolute: 0 10*3/uL (ref 0.0–0.5)
Eosinophils Relative: 0 %
HCT: 25.2 % — ABNORMAL LOW (ref 36.0–46.0)
Hemoglobin: 8.4 g/dL — ABNORMAL LOW (ref 12.0–15.0)
Immature Granulocytes: 1 %
Lymphocytes Relative: 22 %
Lymphs Abs: 1 10*3/uL (ref 0.7–4.0)
MCH: 29.7 pg (ref 26.0–34.0)
MCHC: 33.3 g/dL (ref 30.0–36.0)
MCV: 89 fL (ref 80.0–100.0)
Monocytes Absolute: 0.9 10*3/uL (ref 0.1–1.0)
Monocytes Relative: 19 %
Neutro Abs: 2.6 10*3/uL (ref 1.7–7.7)
Neutrophils Relative %: 58 %
Platelets: 66 10*3/uL — ABNORMAL LOW (ref 150–400)
RBC: 2.83 MIL/uL — ABNORMAL LOW (ref 3.87–5.11)
RDW: 14.6 % (ref 11.5–15.5)
WBC: 4.5 10*3/uL (ref 4.0–10.5)
nRBC: 0 % (ref 0.0–0.2)

## 2022-02-05 LAB — CBG MONITORING, ED
Glucose-Capillary: 79 mg/dL (ref 70–99)
Glucose-Capillary: 156 mg/dL — ABNORMAL HIGH (ref 70–99)

## 2022-02-05 LAB — D-DIMER, QUANTITATIVE: D-Dimer, Quant: 1.55 ug/mL-FEU — ABNORMAL HIGH (ref 0.00–0.50)

## 2022-02-05 LAB — C-REACTIVE PROTEIN: CRP: 22.9 mg/dL — ABNORMAL HIGH (ref <1.0)

## 2022-02-05 MED ORDER — LISINOPRIL 20 MG PO TABS
20.0000 mg | ORAL_TABLET | Freq: Every day | ORAL | Status: DC
  Administered 2022-02-05 – 2022-02-06 (×2): 20 mg via ORAL
  Filled 2022-02-05: qty 2
  Filled 2022-02-05: qty 1

## 2022-02-05 MED ORDER — POTASSIUM CHLORIDE CRYS ER 20 MEQ PO TBCR
40.0000 meq | EXTENDED_RELEASE_TABLET | Freq: Once | ORAL | Status: AC
  Administered 2022-02-05: 40 meq via ORAL
  Filled 2022-02-05: qty 2

## 2022-02-05 MED ORDER — SACCHAROMYCES BOULARDII 250 MG PO CAPS
250.0000 mg | ORAL_CAPSULE | Freq: Two times a day (BID) | ORAL | Status: DC
  Administered 2022-02-05 – 2022-02-18 (×24): 250 mg via ORAL
  Filled 2022-02-05 (×24): qty 1

## 2022-02-05 MED ORDER — ISOSORBIDE MONONITRATE ER 60 MG PO TB24
60.0000 mg | ORAL_TABLET | Freq: Every day | ORAL | Status: DC
  Administered 2022-02-05 – 2022-02-18 (×14): 60 mg via ORAL
  Filled 2022-02-05 (×14): qty 1

## 2022-02-05 MED ORDER — LOPERAMIDE HCL 2 MG PO CAPS
2.0000 mg | ORAL_CAPSULE | Freq: Once | ORAL | Status: AC
  Administered 2022-02-05: 2 mg via ORAL
  Filled 2022-02-05: qty 1

## 2022-02-05 NOTE — Evaluation (Signed)
Occupational Therapy Evaluation Patient Details Name: [name] MRN: [id number] DOB: 08-Jun-1956 Today's Date: 02/05/2022   History of Present Illness Patient 66 year old female who presented with diarhea and pain after a fall. patient was noted to have acute divirticulitis,thrombocytopenia, COIVD 19. x-ray of R shoulder was noted to have mild acromiavicular and mild glenohumeral osteoarthrits. PMH: endometrial CA, HTN, hyperlipidemia, GERD, diabetes.   Clinical Impression   Patient is a 66 year old female who was noted to have had minimal decline in the ability to participate in ADLs. Patient was noted to have increased pain in R shoulder, decreased functional activity tolerance, and decreased standing balance impacting participation in ADLs. Patient would continue to benefit from skilled OT services at this time while admitted and after d/c to address noted deficits in order to improve overall safety and independence in ADLs.       Recommendations for follow up therapy are one component of a multi-disciplinary discharge planning process, led by the attending physician.  Recommendations may be updated based on patient status, additional functional criteria and insurance authorization.   Follow Up Recommendations  No OT follow up    Assistance Recommended at Discharge PRN  Patient can return home with the following Direct supervision/assist for medications management;Direct supervision/assist for financial management;Assistance with cooking/housework    Functional Status Assessment  Patient has had a recent decline in their functional status and demonstrates the ability to make significant improvements in function in a reasonable and predictable amount of time.  Equipment Recommendations  None recommended by OT    Recommendations for Other Services       Precautions / Restrictions Precautions Precaution Comments: R shoulder pain Restrictions Weight Bearing Restrictions: No       Mobility Bed Mobility Overal bed mobility: Needs Assistance Bed Mobility: Supine to Sit, Sit to Supine     Supine to sit: Supervision Sit to supine: Min guard   General bed mobility comments: with increased time for getting in and out of bed. increased time to get BLE back into bed    Transfers                          Balance Overall balance assessment: Mild deficits observed, not formally tested                                         ADL either performed or assessed with clinical judgement   ADL Overall ADL's : Needs assistance/impaired Eating/Feeding: Modified independent   Grooming: Wash/dry face;Wash/dry hands;Set up;Sitting   Upper Body Bathing: Set up;Sitting   Lower Body Bathing: Set up;Sitting/lateral leans   Upper Body Dressing : Sitting;Min guard   Lower Body Dressing: Min guard;Sit to/from stand;Sitting/lateral leans   Toilet Transfer: Min Dispensing optician Details (indicate cue type and reason): BSC in room with increased time. Toileting- Water quality scientist and Hygiene: Min guard;Sit to/from stand Toileting - Clothing Manipulation Details (indicate cue type and reason): simulated     Functional mobility during ADLs: Min guard       Vision Patient Visual Report: Other (comment) (patient reported that cataract felt worse than prior to getting ill.) Vision Assessment?: No apparent visual deficits     Perception     Praxis      Pertinent Vitals/Pain Pain Assessment Pain Assessment: 0-10 Pain Score: 9  Pain Location: R shoulder (  while holding UE over eyes) Pain Descriptors / Indicators: Grimacing     Hand Dominance Right   Extremity/Trunk Assessment Upper Extremity Assessment Upper Extremity Assessment: RUE deficits/detail RUE Deficits / Details: patients ROM was [facility]. Patient was able to reach up over head with no ROM limitations. patient reported pain of 8/10. patient was able to use UE for  supine to sit on edge of bed. no formal MMT completed with pain levels.   Lower Extremity Assessment Lower Extremity Assessment: Defer to PT evaluation       Communication Communication Communication: No difficulties   Cognition Arousal/Alertness: Awake/alert Behavior During Therapy: WFL for tasks assessed/performed Overall Cognitive Status: Within Functional Limits for tasks assessed                                       General Comments       Exercises     Shoulder Instructions      Home Living Family/patient expects to be discharged to:: Private residence Living Arrangements: Children   Type of Home: House Home Access: Stairs to enter Technical brewer of Steps: 3   Home Layout: One level     Bathroom Shower/Tub: Tub/shower unit         Home Equipment: Shower seat          Prior Functioning/Environment Prior Level of Function : Independent/Modified Independent               ADLs Comments: patient was independent ADLs, patients daughter assists with medications        OT Problem List:        OT Treatment/Interventions: Self-care/ADL training;Neuromuscular education;Energy conservation;DME and/or AE instruction;Manual therapy;Therapeutic activities    OT Goals(Current goals can be found in the care plan section) Acute Rehab OT Goals Patient Stated Goal: to get pain under control OT Goal Formulation: With patient Time For Goal Achievement: 02/19/22 Potential to Achieve Goals: Good  OT Frequency: Min 1X/week    Co-evaluation              AM-PAC OT "6 Clicks" Daily Activity     Outcome Measure Help from another person eating meals?: None Help from another person taking care of personal grooming?: A Little Help from another person toileting, which includes using toliet, bedpan, or urinal?: A Little Help from another person bathing (including washing, rinsing, drying)?: A Little Help from another person to put on and  taking off regular upper body clothing?: A Little Help from another person to put on and taking off regular lower body clothing?: A Little 6 Click Score: 19   End of Session Nurse Communication: Mobility status  Activity Tolerance: Patient tolerated treatment well Patient left: in bed;with call bell/phone within reach  OT Visit Diagnosis: Unsteadiness on feet (R26.81);Other abnormalities of gait and mobility (R26.89)                Time: 0240-9735 OT Time Calculation (min): 17 min Charges:  OT General Charges $OT Visit: 1 Visit OT Evaluation $OT Eval Low Complexity: 1 Low  Jackelyn Poling OTR/L, MS Acute Rehabilitation Department Office# [phone] Pager# [phone]   Marcellina Millin 02/05/2022, 3:03 PM

## 2022-02-05 NOTE — ED Notes (Signed)
Breakfast provided.

## 2022-02-05 NOTE — ED Notes (Signed)
Up to bedside commode to have BM. No signs of distress. No complaints of pain. Complains of generalized weakness.

## 2022-02-05 NOTE — Progress Notes (Signed)
PROGRESS NOTE    [name]  PJA:[id number] DOB: 14-Mar-1956 DOA: 02/04/2022 PCP: [name], MD   Brief Narrative: [name] is a 66 y.o. female with a history of endometrial cancer on chemotherapy, hypertension, hyperlipidemia, GERD, diabetes. Patient presented secondary to diarrhea and confusion. Workup in the ED revealed evidence of acute diverticulitis. She was started on empiric antibiotics. Diarrhea of uncertain etiology at this time. IV fluids initiated. Also incidentally found to have COVID-19 infection, on room air and without pulmonary infiltrates. Started on Paxlovid. CRP significantly elevated.   Assessment and Plan: * Acute diverticulitis Mild on imaging. Located in sigmoid colon. Patient given Zosyn IV empirically in the ED. Blood cultures obtained and are no growth to date. -Continue Zosyn IV for now  Thrombocytopenia ([place]) Possibly related to chemotherapy. Patient is just under two weeks out from last treatment. -CBC daily  COVID-19 virus infection Incidental. Asymptomatic. Chest x-ray clear. Patient is immunocompromised secondary to known cancer on chemotherapy. Diagnosed on 2/20 and will need isolation for 21 days. CRP significantly elevated; unsure if related to COVID-19 vs underlying chronic illness -Paxlovid x5 days -Daily CBC, CMP, CRP, D-dimer  Acute diarrhea Patient with a two week persistent course of diarrhea. Associated incontinence. Watery stools. Family states they were told it is not secondary to recently received chemotherapy. Afebrile and no leukocytosis. Immune compromised. Diarrhea now seems to be improving. C. Difficile collection attempted but failed secondary to inappropriate stool consistency. GI pathogen panel pending -IV fluids  Anemia due to antineoplastic chemotherapy- (present on admission) Baseline hemoglobin is around 10. Slightly lower at 8.4 on admission and is stable. No evidence of bleeding. Asymptomatic.  Hypertension-  (present on admission) Patient is on Imdur, lisinopril and metoprolol as an outpatient. Blood pressure significantly uncontrolled. -Continue home metoprolol -Restart home lisinopril and Imdur  Chronic kidney disease, stage 3a ([place])- (present on admission) Stable.  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)- (present on admission) Patient follows with Dr. Alvy Bimler and is currently receiving chemotherapy. Last treatment on 01/22/22. Dr. Alvy Bimler added to treatment team.  Chronic diastolic heart failure ([place])- (present on admission) No evidence of exacerbation. History of normal LVEF with grade 1 diastolic dysfunction. Patient is on lasix as an outpatient -Hold Lasix while having diarrhea/increased fluid losses  Dyslipidemia- (present on admission) Patient is on Lipitor as an outpatient. -Hold Lipitor while on Paxlovid  Obesity (BMI 30-39.9)- (present on admission) Body mass index is 39.32 kg/m.  Uncontrolled type 2 diabetes mellitus with hyperglycemia, without long-term current use of insulin ([place])- (present on admission) Patient is on Trulicity, glimepiride and metformin as an outpatient. Last hemoglobin A1C of 7.9%. Uncontrolled. -Hold home regimen -SSI while inpatient  Decreased oral intake Possibly related to acute illness vs cancer therapy. No physical impediments. -Dietitician  Right shoulder pain Likely related to fall. Good passive range of motion but poor active strength. -Shoulder x-ray -OT eval    DVT prophylaxis: SCDs Code Status:   Code Status: Full Code Family Communication: None at bedside Disposition Plan: Discharge home vs SNF pending PT/OT evaluation. Discharge readiness probably in 24 hours pending stable/improved CRP and improvement in diarrhea volume, in addition to oral transition of antibiotics   Consultants:  Medical oncology  Procedures:  None  Antimicrobials: Zosyn IV    Subjective: Patient reports no diarrhea overnight. She has  not tried to eat anything yet. Still states she "can't eat" but does not give any barriers to eating except lack of desire.  Objective: BP (!) 194/165  Pulse 68    Temp (!) 97.4 F (36.3 C) (Oral)    Resp 16    Ht 5\' 2"  (1.575 m)    Wt 97.5 kg    SpO2 99%    BMI 39.32 kg/m   Examination:  General exam: Appears calm and comfortable Respiratory system: Clear to auscultation. Respiratory effort normal. Cardiovascular system: S1 & S2 heard, RRR. Gastrointestinal system: Abdomen is nondistended, soft and nontender. No organomegaly or masses felt. Normal bowel sounds heard. Central nervous system: Alert and oriented. No focal neurological deficits. Musculoskeletal: No edema. No calf tenderness Skin: No cyanosis. No rashes Psychiatry: Judgement and insight appear normal. Mood & affect appropriate.    Data Reviewed: I have personally reviewed following labs and imaging studies  CBC Lab Results  Component Value Date   WBC 4.5 02/05/2022   RBC 2.83 (L) 02/05/2022   HGB 8.4 (L) 02/05/2022   HCT 25.2 (L) 02/05/2022   MCV 89.0 02/05/2022   MCH 29.7 02/05/2022   PLT 66 (L) 02/05/2022   MCHC 33.3 02/05/2022   RDW 14.6 02/05/2022   LYMPHSABS 1.0 02/05/2022   MONOABS 0.9 02/05/2022   EOSABS 0.0 02/05/2022   BASOSABS 0.0 85/01/7740     Last metabolic panel Lab Results  Component Value Date   NA 135 02/05/2022   K 3.4 (L) 02/05/2022   CL 105 02/05/2022   CO2 23 02/05/2022   BUN 9 02/05/2022   CREATININE 1.00 02/05/2022   GLUCOSE 83 02/05/2022   GFRNONAA >60 02/05/2022   GFRAA 70 10/18/2020   CALCIUM 8.1 (L) 02/05/2022   PHOS 3.2 07/10/2021   PROT 6.0 (L) 02/05/2022   ALBUMIN 2.5 (L) 02/05/2022   LABGLOB 2.6 10/18/2020   AGRATIO 1.6 10/18/2020   BILITOT 0.4 02/05/2022   ALKPHOS 79 02/05/2022   AST 18 02/05/2022   ALT 19 02/05/2022   ANIONGAP 7 02/05/2022    GFR: Estimated Creatinine Clearance: 62.8 mL/min (by C-G formula based on SCr of 1 mg/dL).  Recent Results  (from the past 240 hour(s))  Resp Panel by RT-PCR (Flu A&B, Covid) Nasopharyngeal Swab     Status: Abnormal   Collection Time: 02/04/22  1:53 PM   Specimen: Nasopharyngeal Swab; Nasopharyngeal(NP) swabs in vial transport medium  Result Value Ref Range Status   SARS Coronavirus 2 by RT PCR POSITIVE (A) NEGATIVE Final    Comment: (NOTE) SARS-CoV-2 target nucleic acids are DETECTED.  The SARS-CoV-2 RNA is generally detectable in upper respiratory specimens during the acute phase of infection. Positive results are indicative of the presence of the identified virus, but do not rule out bacterial infection or co-infection with other pathogens not detected by the test. Clinical correlation with patient history and other diagnostic information is necessary to determine patient infection status. The expected result is Negative.  Fact Sheet for Patients: EntrepreneurPulse.com.au  Fact Sheet for Healthcare Providers: IncredibleEmployment.be  This test is not yet approved or cleared by the [place] FDA and  has been authorized for detection and/or diagnosis of SARS-CoV-2 by FDA under an Emergency Use Authorization (EUA).  This EUA will remain in effect (meaning this test can be used) for the duration of  the COVID-19 declaration under Section 564(b)(1) of the A ct, 21 U.S.C. section 360bbb-3(b)(1), unless the authorization is terminated or revoked sooner.     Influenza A by PCR NEGATIVE NEGATIVE Final   Influenza B by PCR NEGATIVE NEGATIVE Final    Comment: (NOTE) The Xpert Xpress SARS-CoV-2/FLU/RSV plus assay is intended as  an aid in the diagnosis of influenza from Nasopharyngeal swab specimens and should not be used as a sole basis for treatment. Nasal washings and aspirates are unacceptable for Xpert Xpress SARS-CoV-2/FLU/RSV testing.  Fact Sheet for Patients: EntrepreneurPulse.com.au  Fact Sheet for Healthcare  Providers: IncredibleEmployment.be  This test is not yet approved or cleared by the [place] FDA and has been authorized for detection and/or diagnosis of SARS-CoV-2 by FDA under an Emergency Use Authorization (EUA). This EUA will remain in effect (meaning this test can be used) for the duration of the COVID-19 declaration under Section 564(b)(1) of the Act, 21 U.S.C. section 360bbb-3(b)(1), unless the authorization is terminated or revoked.  Performed at [facility], [place] [address], [place], [place] [address]   Blood culture (routine x 2)     Status: None (Preliminary result)   Collection Time: 02/04/22  5:17 PM   Specimen: BLOOD  Result Value Ref Range Status   Specimen Description   Final    BLOOD LEFT ANTECUBITAL Performed at [facility] [address] [place], [place] [address]    Special Requests   Final    BOTTLES DRAWN AEROBIC AND ANAEROBIC Blood Culture adequate volume Performed at [facility] [address], [place], [place] [address]    Culture   Final    NO GROWTH < 12 HOURS Performed at [facility] [address] [place], [place] [address]    Report Status PENDING  Incomplete  Blood culture (routine x 2)     Status: None (Preliminary result)   Collection Time: 02/04/22  5:24 PM   Specimen: BLOOD  Result Value Ref Range Status   Specimen Description   Final    BLOOD RIGHT ANTECUBITAL Performed at [facility] [address] [place], [place] [address]    Special Requests   Final    BOTTLES DRAWN AEROBIC AND ANAEROBIC Blood Culture adequate volume Performed at [facility] [address], [place], [place] [address]    Culture   Final    NO GROWTH < 12 HOURS Performed at [facility] [address] [place], [place] [address]    Report Status PENDING  Incomplete      Radiology Studies: DG Shoulder Right  Result  Date: 02/04/2022 CLINICAL DATA:  Uterine cancer.  Diarrhea.  Right shoulder pain. EXAM: RIGHT SHOULDER - 2+ VIEW COMPARISON:  None. FINDINGS: There is diffuse decreased bone mineralization. Mild acromioclavicular joint space narrowing with mild inferior degenerative osteophytes. Mild inferior glenoid degenerative osteophytosis. No acute fracture is seen. No dislocation. Partial visualization of right chest port a catheter. 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 Electronically Signed   By: Yvonne Kendall M.D.   On: 02/04/2022 19:08   CT Head Wo Contrast  Result Date: 02/04/2022 CLINICAL DATA:  Mental status change, unknown cause EXAM: CT HEAD WITHOUT CONTRAST TECHNIQUE: Contiguous axial images were obtained from the base of the skull through the vertex without intravenous contrast. RADIATION DOSE REDUCTION: This exam was performed according to the departmental dose-optimization program which includes automated exposure control, adjustment of the mA and/or kV according to patient size and/or use of iterative reconstruction technique. COMPARISON:  None. FINDINGS: Brain: There is no acute intracranial hemorrhage, mass effect, or edema. Gray-white differentiation is preserved. Small focus of low density in the white matter adjacent to the posterior body of the left lateral ventricle. There is no extra-axial fluid collection. Ventricles and sulci are  within normal limits in size and configuration. Vascular: There is atherosclerotic calcification at the skull base. Skull: Calvarium is unremarkable. Sinuses/Orbits: No acute finding. Other: None. IMPRESSION: No acute intracranial hemorrhage. 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 Electronically Signed   By: Macy Mis M.D.   On: 02/04/2022 16:55   CT Abdomen Pelvis W Contrast  Result Date: 02/04/2022 CLINICAL DATA:  Abdominal pain.  History of  uterine cancer EXAM: CT ABDOMEN AND PELVIS WITH CONTRAST TECHNIQUE: Multidetector CT imaging of the abdomen and pelvis was performed using the standard protocol following bolus administration of intravenous contrast. RADIATION DOSE REDUCTION: This exam was performed according to the departmental dose-optimization program which includes automated exposure control, adjustment of the mA and/or kV according to patient size and/or use of iterative reconstruction technique. CONTRAST:  120mL OMNIPAQUE IOHEXOL 300 MG/ML  SOLN COMPARISON:  01/28/2022 FINDINGS: Lower chest: Included lung bases are clear. Heart size within normal limits. Hepatobiliary: No focal liver abnormality is seen. No gallstones, gallbladder wall thickening, or biliary dilatation. Pancreas: Unremarkable. No pancreatic ductal dilatation or surrounding inflammatory changes. Spleen: Normal in size without focal abnormality. Adrenals/Urinary Tract: Unremarkable adrenal glands. Kidneys enhance symmetrically without focal lesion, stone, or hydronephrosis. Ureters are nondilated. Urinary bladder appears unremarkable. Stomach/Bowel: Colonic diverticulosis with new mild pericolonic fat stranding adjacent to a prominent diverticula at the proximal sigmoid colon where there is short segment bowel thickening (series 5, images 66-68). Scattered diverticulosis within the remaining sigmoid and descending colon. No additional sites of focal inflammation. Normal appendix in the right lower quadrant. No dilated loops of bowel. Stomach within normal limits. Vascular/Lymphatic: Scattered atherosclerotic calcifications throughout the aortoiliac axis. Stable mildly enlarged left pelvic sidewall lymph node measuring approximately 1.3 cm short axis (series 5, image 74). Reproductive: Prior hysterectomy. Stable prominence at the vaginal cuff, indeterminate. No adnexal masses. Other: Mild presacral fluid/edema. No organized abdominopelvic fluid collection. No pneumoperitoneum.  Musculoskeletal: No new or acute bony findings. IMPRESSION: 1. Acute uncomplicated sigmoid diverticulitis. 2. Prior hysterectomy with stable prominence at the vaginal cuff, indeterminate. Stable mildly enlarged left pelvic sidewall lymph node. Aortic Atherosclerosis (ICD10-I70.0). Electronically Signed   By: Davina Poke D.O.   On: 02/04/2022 16:58   DG Chest Port 1 View  Result Date: 02/04/2022 CLINICAL DATA:  History of uterine cancer. Diarrhea. Unable to eat for 4 days. EXAM: PORTABLE CHEST 1 VIEW COMPARISON:  January 28, 2022 FINDINGS: The heart size and mediastinal contours are stable. Right central venous line is unchanged. Both lungs are clear. The visualized skeletal structures are unremarkable. IMPRESSION: No active disease. Electronically Signed   By: Abelardo Diesel M.D.   On: 02/04/2022 14:20      LOS: 0 days    Cordelia Poche, MD [facility] 02/05/2022, 2:36 PM   If 7PM-7AM, please contact night-coverage [email or web address]

## 2022-02-05 NOTE — ED Notes (Signed)
Lab states stool sample too formed for c diff panel.

## 2022-02-05 NOTE — ED Notes (Signed)
Lunch tray provided. 

## 2022-02-05 NOTE — ED Notes (Signed)
Pt incontinent. Watery stool on bed/self. Linens changed and pt cleaned

## 2022-02-05 NOTE — Plan of Care (Signed)
New admission 1517.  Diverticulitis, COVID 19 IVF, IV Abx per orders Diet as tolerated.  Support given to patient throughout admission process, pt reports feeling overwhelmed and tearful.   Problem: Education: Goal: Knowledge of General Education information will improve Description: Including pain rating scale, medication(s)/side effects and non-pharmacologic comfort measures Outcome: Progressing   Problem: Health Behavior/Discharge Planning: Goal: Ability to manage health-related needs will improve Outcome: Progressing   Problem: Clinical Measurements: Goal: Ability to maintain clinical measurements within normal limits will improve Outcome: Progressing Goal: Will remain free from infection Outcome: Progressing Goal: Diagnostic test results will improve Outcome: Progressing Goal: Respiratory complications will improve Outcome: Progressing Goal: Cardiovascular complication will be avoided Outcome: Progressing   Problem: Activity: Goal: Risk for activity intolerance will decrease Outcome: Progressing   Problem: Nutrition: Goal: Adequate nutrition will be maintained Outcome: Progressing   Problem: Coping: Goal: Level of anxiety will decrease Outcome: Progressing   Problem: Elimination: Goal: Will not experience complications related to bowel motility Outcome: Progressing Goal: Will not experience complications related to urinary retention Outcome: Progressing   Problem: Pain Managment: Goal: General experience of comfort will improve Outcome: Progressing   Problem: Safety: Goal: Ability to remain free from injury will improve Outcome: Progressing   Problem: Skin Integrity: Goal: Risk for impaired skin integrity will decrease Outcome: Progressing   Problem: Education: Goal: Knowledge of risk factors and measures for prevention of condition will improve Outcome: Progressing   Problem: Coping: Goal: Psychosocial and spiritual needs will be supported Outcome:  Progressing   Problem: Respiratory: Goal: Will maintain a patent airway Outcome: Progressing Goal: Complications related to the disease process, condition or treatment will be avoided or minimized Outcome: Progressing

## 2022-02-05 NOTE — Hospital Course (Signed)
[name] is a 66 y.o. female with a history of endometrial cancer on chemotherapy, hypertension, hyperlipidemia, GERD, diabetes. Patient presented secondary to diarrhea and confusion. Workup in the ED revealed evidence of acute diverticulitis. She was started on empiric antibiotics. Diarrhea of uncertain etiology at this time. IV fluids initiated. Also incidentally found to have COVID-19 infection, on room air and without pulmonary infiltrates. Started on Paxlovid. CRP significantly elevated.

## 2022-02-05 NOTE — Progress Notes (Signed)
Pt does not want her port accessed at this time.  Requesting to use PIV for fluids and antibiotics.

## 2022-02-05 NOTE — ED Notes (Signed)
Up to bedside commode. No signs of distress. VSS

## 2022-02-05 NOTE — Telephone Encounter (Signed)
Called [name] to tell her appts 2/28 canceled. Will send scheduling message to reschedule in 21 days from yesterdat. Positive for covid yuesterday. [name] aware.

## 2022-02-06 LAB — COMPREHENSIVE METABOLIC PANEL
ALT: 35 U/L (ref 0–44)
AST: 42 U/L — ABNORMAL HIGH (ref 15–41)
Albumin: 2.3 g/dL — ABNORMAL LOW (ref 3.5–5.0)
Alkaline Phosphatase: 169 U/L — ABNORMAL HIGH (ref 38–126)
Anion gap: 5 (ref 5–15)
BUN: 9 mg/dL (ref 8–23)
CO2: 22 mmol/L (ref 22–32)
Calcium: 7.7 mg/dL — ABNORMAL LOW (ref 8.9–10.3)
Chloride: 108 mmol/L (ref 98–111)
Creatinine, Ser: 0.88 mg/dL (ref 0.44–1.00)
GFR, Estimated: >60 mL/min (ref >60)
Glucose, Bld: 119 mg/dL — ABNORMAL HIGH (ref 70–99)
Potassium: 3.2 mmol/L — ABNORMAL LOW (ref 3.5–5.1)
Sodium: 135 mmol/L (ref 135–145)
Total Bilirubin: 0.5 mg/dL (ref 0.3–1.2)
Total Protein: 5.6 g/dL — ABNORMAL LOW (ref 6.5–8.1)

## 2022-02-06 LAB — CBC WITH DIFFERENTIAL/PLATELET
Abs Immature Granulocytes: 0.05 10*3/uL (ref 0.00–0.07)
Basophils Absolute: 0 10*3/uL (ref 0.0–0.1)
Basophils Relative: 0 %
Eosinophils Absolute: 0 10*3/uL (ref 0.0–0.5)
Eosinophils Relative: 0 %
HCT: 22.3 % — ABNORMAL LOW (ref 36.0–46.0)
Hemoglobin: 7.3 g/dL — ABNORMAL LOW (ref 12.0–15.0)
Immature Granulocytes: 1 %
Lymphocytes Relative: 27 %
Lymphs Abs: 1 10*3/uL (ref 0.7–4.0)
MCH: 29.2 pg (ref 26.0–34.0)
MCHC: 32.7 g/dL (ref 30.0–36.0)
MCV: 89.2 fL (ref 80.0–100.0)
Monocytes Absolute: 0.5 10*3/uL (ref 0.1–1.0)
Monocytes Relative: 15 %
Neutro Abs: 2 10*3/uL (ref 1.7–7.7)
Neutrophils Relative %: 57 %
Platelets: 58 10*3/uL — ABNORMAL LOW (ref 150–400)
RBC: 2.5 MIL/uL — ABNORMAL LOW (ref 3.87–5.11)
RDW: 14.9 % (ref 11.5–15.5)
WBC: 3.6 10*3/uL — ABNORMAL LOW (ref 4.0–10.5)
nRBC: 0 % (ref 0.0–0.2)

## 2022-02-06 LAB — GASTROINTESTINAL PANEL BY PCR, STOOL (REPLACES STOOL CULTURE)

## 2022-02-06 LAB — GLUCOSE, CAPILLARY
Glucose-Capillary: 104 mg/dL — ABNORMAL HIGH (ref 70–99)
Glucose-Capillary: 125 mg/dL — ABNORMAL HIGH (ref 70–99)
Glucose-Capillary: 131 mg/dL — ABNORMAL HIGH (ref 70–99)
Glucose-Capillary: 123 mg/dL — ABNORMAL HIGH (ref 70–99)

## 2022-02-06 LAB — CBC
HCT: 22.4 % — ABNORMAL LOW (ref 36.0–46.0)
Hemoglobin: 7.2 g/dL — ABNORMAL LOW (ref 12.0–15.0)
MCH: 28.9 pg (ref 26.0–34.0)
MCHC: 32.1 g/dL (ref 30.0–36.0)
MCV: 90 fL (ref 80.0–100.0)
Platelets: 64 10*3/uL — ABNORMAL LOW (ref 150–400)
RBC: 2.49 MIL/uL — ABNORMAL LOW (ref 3.87–5.11)
RDW: 14.8 % (ref 11.5–15.5)
WBC: 4.8 10*3/uL (ref 4.0–10.5)
nRBC: 0 % (ref 0.0–0.2)

## 2022-02-06 LAB — D-DIMER, QUANTITATIVE: D-Dimer, Quant: 1.88 ug/mL-FEU — ABNORMAL HIGH (ref 0.00–0.50)

## 2022-02-06 LAB — BASIC METABOLIC PANEL
Anion gap: 6 (ref 5–15)
BUN: 7 mg/dL — ABNORMAL LOW (ref 8–23)
CO2: 22 mmol/L (ref 22–32)
Calcium: 8.1 mg/dL — ABNORMAL LOW (ref 8.9–10.3)
Chloride: 110 mmol/L (ref 98–111)
Creatinine, Ser: 1.11 mg/dL — ABNORMAL HIGH (ref 0.44–1.00)
GFR, Estimated: 56 mL/min — ABNORMAL LOW (ref >60)
Glucose, Bld: 131 mg/dL — ABNORMAL HIGH (ref 70–99)
Potassium: 4.2 mmol/L (ref 3.5–5.1)
Sodium: 138 mmol/L (ref 135–145)

## 2022-02-06 LAB — C-REACTIVE PROTEIN: CRP: 14.6 mg/dL — ABNORMAL HIGH (ref <1.0)

## 2022-02-06 MED ORDER — GLUCERNA SHAKE PO LIQD
237.0000 mL | Freq: Three times a day (TID) | ORAL | Status: DC
  Administered 2022-02-06 – 2022-02-10 (×10): 237 mL via ORAL
  Filled 2022-02-06 (×15): qty 237

## 2022-02-06 MED ORDER — POTASSIUM CHLORIDE CRYS ER 20 MEQ PO TBCR
40.0000 meq | EXTENDED_RELEASE_TABLET | ORAL | Status: AC
  Administered 2022-02-06 (×2): 40 meq via ORAL
  Filled 2022-02-06 (×2): qty 2

## 2022-02-06 MED ORDER — ADULT MULTIVITAMIN W/MINERALS CH
1.0000 | ORAL_TABLET | Freq: Every day | ORAL | Status: DC
  Administered 2022-02-06 – 2022-02-18 (×13): 1 via ORAL
  Filled 2022-02-06 (×13): qty 1

## 2022-02-06 MED ORDER — AMOXICILLIN-POT CLAVULANATE 875-125 MG PO TABS
1.0000 | ORAL_TABLET | Freq: Two times a day (BID) | ORAL | Status: DC
  Administered 2022-02-06 – 2022-02-07 (×3): 1 via ORAL
  Filled 2022-02-06 (×3): qty 1

## 2022-02-06 NOTE — Plan of Care (Signed)
No acute events overnight. Problem: Education: Goal: Knowledge of General Education information will improve Description: Including pain rating scale, medication(s)/side effects and non-pharmacologic comfort measures Outcome: Progressing   Problem: Health Behavior/Discharge Planning: Goal: Ability to manage health-related needs will improve Outcome: Progressing   Problem: Clinical Measurements: Goal: Ability to maintain clinical measurements within normal limits will improve Outcome: Progressing Goal: Will remain free from infection Outcome: Progressing Goal: Diagnostic test results will improve Outcome: Progressing Goal: Respiratory complications will improve Outcome: Progressing Goal: Cardiovascular complication will be avoided Outcome: Progressing   Problem: Activity: Goal: Risk for activity intolerance will decrease Outcome: Progressing   Problem: Nutrition: Goal: Adequate nutrition will be maintained Outcome: Progressing   Problem: Coping: Goal: Level of anxiety will decrease Outcome: Progressing   Problem: Elimination: Goal: Will not experience complications related to bowel motility Outcome: Progressing Goal: Will not experience complications related to urinary retention Outcome: Progressing   Problem: Pain Managment: Goal: General experience of comfort will improve Outcome: Progressing   Problem: Safety: Goal: Ability to remain free from injury will improve Outcome: Progressing   Problem: Skin Integrity: Goal: Risk for impaired skin integrity will decrease Outcome: Progressing   Problem: Education: Goal: Knowledge of risk factors and measures for prevention of condition will improve Outcome: Progressing   Problem: Coping: Goal: Psychosocial and spiritual needs will be supported Outcome: Progressing   Problem: Respiratory: Goal: Will maintain a patent airway Outcome: Progressing Goal: Complications related to the disease process, condition or  treatment will be avoided or minimized Outcome: Progressing   

## 2022-02-06 NOTE — Progress Notes (Signed)
PROGRESS NOTE    [name]  MIW:[id number] DOB: Sep 21, 1956 DOA: 02/04/2022 PCP: [name], MD   Brief Narrative: [name] is a 66 y.o. female with a history of endometrial cancer on chemotherapy, hypertension, hyperlipidemia, GERD, diabetes. Patient presented secondary to diarrhea and confusion. Workup in the ED revealed evidence of acute diverticulitis. She was started on empiric antibiotics. Diarrhea of uncertain etiology at this time. IV fluids initiated. Also incidentally found to have COVID-19 infection, on room air and without pulmonary infiltrates. Started on Paxlovid. CRP significantly elevated.   Assessment and Plan: * Acute diverticulitis Mild on imaging. Located in sigmoid colon. Patient given Zosyn IV empirically in the ED. Blood cultures obtained and are no growth to date. Switch to oral Augmentin. Blood cultures negative. Reportedly had an episode of blood in the vaginal discharge although that could be colitis/diverticulitis as well as it was not witnessed. Monitor. Check CBC.  Decreased oral intake Possibly related to acute illness vs cancer therapy. No physical impediments. -Dietitician  Right shoulder pain Likely related to fall. Good passive range of motion but poor active strength. -Shoulder x-ray -OT eval  Thrombocytopenia (HCC) Possibly related to chemotherapy. Patient is just under two weeks out from last treatment. -CBC daily  COVID-19 virus infection Incidental. Asymptomatic. Chest x-ray clear. Patient is immunocompromised secondary to known cancer on chemotherapy. Diagnosed on 2/20 and will need isolation for 21 days. CRP significantly elevated; unsure if related to COVID-19 vs underlying chronic illness -Paxlovid x5 days -Daily CBC, CMP, CRP, D-dimer  Acute diarrhea Patient with a two week persistent course of diarrhea. Associated incontinence. Watery stools. Family states they were told it is not secondary to recently received  chemotherapy. Afebrile and no leukocytosis. Immune compromised. Diarrhea now seems to be improving. C. Difficile collection attempted but failed secondary to inappropriate stool consistency. GI pathogen panel negative Treated with IV fluid.  Anemia due to antineoplastic chemotherapy- (present on admission) Baseline hemoglobin is around 10. Slightly lower at 8.4 on admission and is stable. No evidence of bleeding. Asymptomatic.  Chronic kidney disease, stage 3a ([place])- (present on admission) Stable.  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 Alvy Bimler and is currently receiving chemotherapy. Last treatment on 01/22/22. Dr. Alvy Bimler added to treatment team. They will follow-up tomorrow.  Chronic diastolic heart failure ([place])- (present on admission) No evidence of exacerbation. History of normal LVEF with grade 1 diastolic dysfunction. Patient is on lasix as an outpatient -Hold Lasix while having diarrhea/increased fluid losses  Dyslipidemia- (present on admission) Patient is on Lipitor as an outpatient. -Hold Lipitor while on Paxlovid  Obesity (BMI 30-39.9)- (present on admission) Body mass index is 39.32 kg/m.  Hypertension- (present on admission) Patient is on Imdur, lisinopril and metoprolol as an outpatient. Blood pressure significantly uncontrolled. -Continue home metoprolol -Restart home lisinopril and Imdur  Uncontrolled type 2 diabetes mellitus with hyperglycemia, without long-term current use of insulin ([place])- (present on admission) Patient is on Trulicity, glimepiride and metformin as an outpatient. Last hemoglobin A1C of 7.9%. Uncontrolled. -Hold home regimen -SSI while inpatient    DVT prophylaxis: SCDs Code Status:   Code Status: Full Code Family Communication: None at bedside Disposition Plan: Discharge home vs SNF pending PT/OT evaluation. Discharge readiness probably in 24 hours pending stable/improved CRP and  improvement in diarrhea volume, in addition to oral transition of antibiotics   Consultants:  Medical oncology  Procedures:  None  Antimicrobials: Zosyn IV    Subjective: Reports fatigue.  No nausea or vomiting.  No further episode of bowel movement since yesterday.  Passing gas.  Minimal oral intake.  Reports abdominal pain in the lower abdomen still present.  Per RN had an episode of small amount of blood from her vagina.  Objective: BP 124/65 (BP Location: Left Arm)    Pulse 73    Temp 97.8 F (36.6 C) (Oral)    Resp 20    Ht 5\' 2"  (1.575 m)    Wt 97.5 kg    SpO2 97%    BMI 39.32 kg/m   Examination: General: Appear in mild distress, no Rash; Oral Mucosa Clear, moist. no Abnormal Neck Mass Or lumps, Conjunctiva normal  Cardiovascular: S1 and S2 Present, no Murmur, Respiratory: good respiratory effort, Bilateral Air entry present and CTA, no Crackles, no wheezes Abdomen: Bowel Sound present, Soft and no tenderness Extremities: no Pedal edema Neurology: alert and oriented to time, place, and person affect appropriate. no new focal deficit Gait not checked due to patient safety concerns    Data Reviewed:  I have Reviewed nursing notes, Vitals, and Lab results since pt's last encounter. Pertinent lab results CBC, BMP I have ordered test including CBC, BMP I have discussed pt's care plan and test results with oncology.   Author:  Berle Mull, MD [facility] 02/06/2022  8:31 PM   To reach On-call, see care teams to locate the attending and reach out to them via [email or web address]. If 7PM-7AM, please contact night-coverage If you still have difficulty reaching the attending provider, please page the [facility] (Director on Call) for [facility] on amion for assistance.

## 2022-02-06 NOTE — Progress Notes (Signed)
Initial Nutrition Assessment  DOCUMENTATION CODES:   Obesity unspecified  INTERVENTION:   -Glucerna Shake po TID, each supplement provides 220 kcal and 10 grams of protein  -Multivitamin with minerals daily  NUTRITION DIAGNOSIS:   Increased nutrient needs related to cancer and cancer related treatments as evidenced by estimated needs.  GOAL:   Patient will meet greater than or equal to 90% of their needs  MONITOR:   PO intake, Supplement acceptance, Labs, Weight trends, I & O's  REASON FOR ASSESSMENT:   Consult Assessment of nutrition requirement/status  ASSESSMENT:   66 y.o. female with a history of endometrial cancer on chemotherapy, hypertension, hyperlipidemia, GERD, diabetes. Patient presented secondary to diarrhea and confusion. Workup in the ED revealed evidence of acute diverticulitis.  Patient not available at time of visit, working with therapies. Will gather history at later date.  Per chart review, pt undergoing chemotherapy for endometrial cancer, last treatment  2/7. Since treatment pt has been having N/V and diarrhea. Appetite has been poor. Incidentally COVID-19+. Will order protein shake given poor PO.  Per weight records, pt's weight has fluctuated over the past year.  02/28/21: 206 lbs. 01/01/22: 234 lbs Current weight; 215 lbs  Per nursing documentation, pt with mild BLE edema.   Medications: Vitamin C, Calcium carbonate, KLOR-CON, Florastor, Zinc sulfate  Labs reviewed: CBGs: 104-156 Low K  NUTRITION - FOCUSED PHYSICAL EXAM:  Unable to complete  Diet Order:   Diet Order             Diet Carb Modified Fluid consistency: Thin; Room service appropriate? Yes  Diet effective now                   EDUCATION NEEDS:   No education needs have been identified at this time  Skin:  Skin Assessment: Reviewed RN Assessment  Last BM:  2/21  Height:   Ht Readings from Last 1 Encounters:  02/05/22 5\' 2"  (1.575 m)    Weight:   Wt  Readings from Last 1 Encounters:  02/04/22 97.5 kg    BMI:  Body mass index is 39.32 kg/m.  Estimated Nutritional Needs:   Kcal:  0100-7121  Protein:  85-105g  Fluid:  2L/day  Clayton Bibles, MS, RD, LDN Inpatient Clinical Dietitian Contact information available via Amion

## 2022-02-06 NOTE — Evaluation (Addendum)
Physical Therapy Evaluation Patient Details Name: [name] MRN: [id number] DOB: [birth year] Today's Date: 02/06/2022  History of Present Illness  66 y.o. female with medical history significant of endometrial cancer on chemotherapy, hypertension, hyperlipidemia, GERD, diabetes. Patient presented secondary to being "out of it," having "loose bowels," chest pain, poor appetite. Dx of diverticulitis, incidental COVID finding.  Clinical Impression  Pt admitted with above diagnosis. Pt ambulated 6' with RW, no loss of balance, distance limited by pelvic pressure/pain. Instructed pt in seated BLE strengthening exercises to be done independently to minimize deconditioning during hospitalization. She has 24* assist available at home. Good progress expected.  Pt currently with functional limitations due to the deficits listed below (see PT Problem List). Pt will benefit from skilled PT to increase their independence and safety with mobility to allow discharge to the venue listed below.      Recommendations for follow up therapy are one component of a multi-disciplinary discharge planning process, led by the attending physician.  Recommendations may be updated based on patient status, additional functional criteria and insurance authorization.  Follow Up Recommendations Home health PT    Assistance Recommended at Discharge Set up Supervision/Assistance  Patient can return home with the following  A little help with bathing/dressing/bathroom;Help with stairs or ramp for entrance;Assistance with cooking/housework;Assist for transportation    Equipment Recommendations Rollator (4 wheels) (pt's daughter stated they can purchase a rollator, pt is uninsured)  Recommendations for Other Services       Functional Status Assessment Patient has had a recent decline in their functional status and demonstrates the ability to make significant improvements in function in a reasonable and predictable amount of  time.     Precautions / Restrictions Precautions Precautions: Shoulder Type of Shoulder Precautions: R shoulder pain (due to recent fall) Restrictions Weight Bearing Restrictions: No      Mobility  Bed Mobility               General bed mobility comments: sitting up at edge of bed    Transfers Overall transfer level: Needs assistance Equipment used: Rolling walker (2 wheels) Transfers: Sit to/from Stand Sit to Stand: Supervision           General transfer comment: supervision for safety due to recent fall    Ambulation/Gait Ambulation/Gait assistance: Supervision Gait Distance (Feet): 35 Feet Assistive device: Rolling walker (2 wheels) Gait Pattern/deviations: Step-through pattern, Decreased stride length Gait velocity: decr     General Gait Details: steady with RW, distance limited by pelvic pressure/pain  Stairs            Wheelchair Mobility    Modified Rankin (Stroke Patients Only)       Balance Overall balance assessment: Modified Independent                                           Pertinent Vitals/Pain Pain Assessment Pain Score: 6  Pain Location: R shoulder Pain Descriptors / Indicators: Guarding Pain Intervention(s): Limited activity within patient's tolerance, Monitored during session, Patient requesting pain meds-RN notified    Home Living Family/patient expects to be discharged to:: Private residence Living Arrangements: Children Available Help at Discharge: Family;Available 24 hours/day Type of Home: House Home Access: Stairs to enter Entrance Stairs-Rails: None Entrance Stairs-Number of Steps: 3   Home Layout: One level Home Equipment: Shower seat;Cane - single point  Prior Function Prior Level of Function : Independent/Modified Independent             Mobility Comments: walks with a cane, had 2 fall 02/02/22 (no other falls in past 6 months) ADLs Comments: patient was independent ADLs,  patients daughter assists with medications     Hand Dominance   Dominant Hand: Right    Extremity/Trunk Assessment   Upper Extremity Assessment Upper Extremity Assessment: Defer to OT evaluation    Lower Extremity Assessment Lower Extremity Assessment: RLE deficits/detail;LLE deficits/detail RLE Deficits / Details: knee ext +4/5, sensation intact to light touch RLE Sensation: history of peripheral neuropathy LLE Deficits / Details: knee ext +4/5, sensation intact to light touch LLE Sensation: history of peripheral neuropathy       Communication   Communication: No difficulties  Cognition Arousal/Alertness: Awake/alert Behavior During Therapy: WFL for tasks assessed/performed Overall Cognitive Status: Within Functional Limits for tasks assessed                                          General Comments      Exercises  PT demonstrated seated marching, long arc quads and ankle pumps to pt. She was too fatigued to attempt them. Encouraged her to perform them independently for deconditioning.    Assessment/Plan    PT Assessment Patient needs continued PT services  PT Problem List Decreased activity tolerance;Decreased balance;Decreased mobility;Pain       PT Treatment Interventions Gait training;Therapeutic activities;Therapeutic exercise;Patient/family education;Functional mobility training;Balance training    PT Goals (Current goals can be found in the Care Plan section)  Acute Rehab PT Goals Patient Stated Goal: to be able to walk farther PT Goal Formulation: With patient/family Time For Goal Achievement: 02/20/22 Potential to Achieve Goals: Good    Frequency Min 3X/week     Co-evaluation               AM-PAC PT "6 Clicks" Mobility  Outcome Measure Help needed turning from your back to your side while in a flat bed without using bedrails?: A Little Help needed moving from lying on your back to sitting on the side of a flat bed without  using bedrails?: A Little Help needed moving to and from a bed to a chair (including a wheelchair)?: None Help needed standing up from a chair using your arms (e.g., wheelchair or bedside chair)?: None Help needed to walk in hospital room?: A Little Help needed climbing 3-5 steps with a railing? : A Little 6 Click Score: 20    End of Session   Activity Tolerance: Patient limited by pain Patient left: in bed;with family/visitor present;with call bell/phone within reach Nurse Communication: Mobility status PT Visit Diagnosis: Difficulty in walking, not elsewhere classified (R26.2);Pain Pain - Right/Left: Right Pain - part of body: Shoulder    Time: 3888-2800 PT Time Calculation (min) (ACUTE ONLY): 17 min   Charges:   PT Evaluation $PT Eval Moderate Complexity: 1 Mod         Philomena Doheny PT 02/06/2022  Acute Rehabilitation Services Pager [phone] Office [phone]

## 2022-02-07 ENCOUNTER — Inpatient Hospital Stay (HOSPITAL_COMMUNITY): Payer: Medicaid Other

## 2022-02-07 DIAGNOSIS — R778 Other specified abnormalities of plasma proteins: Secondary | ICD-10-CM

## 2022-02-07 DIAGNOSIS — K5792 Diverticulitis of intestine, part unspecified, without perforation or abscess without bleeding: Secondary | ICD-10-CM

## 2022-02-07 DIAGNOSIS — I5032 Chronic diastolic (congestive) heart failure: Secondary | ICD-10-CM

## 2022-02-07 DIAGNOSIS — R609 Edema, unspecified: Secondary | ICD-10-CM

## 2022-02-07 LAB — CBC WITH DIFFERENTIAL/PLATELET
Abs Immature Granulocytes: 0.09 10*3/uL — ABNORMAL HIGH (ref 0.00–0.07)
Basophils Absolute: 0 10*3/uL (ref 0.0–0.1)
Basophils Relative: 0 %
Eosinophils Absolute: 0 10*3/uL (ref 0.0–0.5)
Eosinophils Relative: 0 %
HCT: 22.3 % — ABNORMAL LOW (ref 36.0–46.0)
Hemoglobin: 7.2 g/dL — ABNORMAL LOW (ref 12.0–15.0)
Immature Granulocytes: 2 %
Lymphocytes Relative: 35 %
Lymphs Abs: 1.4 10*3/uL (ref 0.7–4.0)
MCH: 29.3 pg (ref 26.0–34.0)
MCHC: 32.3 g/dL (ref 30.0–36.0)
MCV: 90.7 fL (ref 80.0–100.0)
Monocytes Absolute: 0.6 10*3/uL (ref 0.1–1.0)
Monocytes Relative: 15 %
Neutro Abs: 1.9 10*3/uL (ref 1.7–7.7)
Neutrophils Relative %: 48 %
Platelets: 58 10*3/uL — ABNORMAL LOW (ref 150–400)
RBC: 2.46 MIL/uL — ABNORMAL LOW (ref 3.87–5.11)
RDW: 15.2 % (ref 11.5–15.5)
WBC: 4 10*3/uL (ref 4.0–10.5)
nRBC: 0.5 % — ABNORMAL HIGH (ref 0.0–0.2)

## 2022-02-07 LAB — COMPREHENSIVE METABOLIC PANEL
ALT: 40 U/L (ref 0–44)
AST: 44 U/L — ABNORMAL HIGH (ref 15–41)
Albumin: 2.3 g/dL — ABNORMAL LOW (ref 3.5–5.0)
Alkaline Phosphatase: 205 U/L — ABNORMAL HIGH (ref 38–126)
Anion gap: 5 (ref 5–15)
BUN: 7 mg/dL — ABNORMAL LOW (ref 8–23)
CO2: 19 mmol/L — ABNORMAL LOW (ref 22–32)
Calcium: 7.8 mg/dL — ABNORMAL LOW (ref 8.9–10.3)
Chloride: 111 mmol/L (ref 98–111)
Creatinine, Ser: 1.06 mg/dL — ABNORMAL HIGH (ref 0.44–1.00)
GFR, Estimated: 59 mL/min — ABNORMAL LOW (ref >60)
Glucose, Bld: 113 mg/dL — ABNORMAL HIGH (ref 70–99)
Potassium: 3.8 mmol/L (ref 3.5–5.1)
Sodium: 135 mmol/L (ref 135–145)
Total Bilirubin: 0.3 mg/dL (ref 0.3–1.2)
Total Protein: 5.8 g/dL — ABNORMAL LOW (ref 6.5–8.1)

## 2022-02-07 LAB — GLUCOSE, CAPILLARY
Glucose-Capillary: 134 mg/dL — ABNORMAL HIGH (ref 70–99)
Glucose-Capillary: 63 mg/dL — ABNORMAL LOW (ref 70–99)
Glucose-Capillary: 108 mg/dL — ABNORMAL HIGH (ref 70–99)
Glucose-Capillary: 111 mg/dL — ABNORMAL HIGH (ref 70–99)

## 2022-02-07 LAB — ECHOCARDIOGRAM COMPLETE
AR max vel: 3.69 cm2
AV Area VTI: 3.74 cm2
AV Area mean vel: 3.4 cm2
AV Mean grad: 6 mmHg
AV Peak grad: 10.5 mmHg
Ao pk vel: 1.62 m/s
Area-P 1/2: 3.74 cm2
Height: 62 in
MV M vel: 5.23 m/s
MV Peak grad: 109.4 mmHg
MV VTI: 3.62 cm2
S' Lateral: 2.3 cm
Weight: 3440 oz

## 2022-02-07 LAB — D-DIMER, QUANTITATIVE: D-Dimer, Quant: 2.14 ug/mL-FEU — ABNORMAL HIGH (ref 0.00–0.50)

## 2022-02-07 LAB — C-REACTIVE PROTEIN: CRP: 12.8 mg/dL — ABNORMAL HIGH (ref <1.0)

## 2022-02-07 MED ORDER — CEFDINIR 300 MG PO CAPS
300.0000 mg | ORAL_CAPSULE | Freq: Two times a day (BID) | ORAL | Status: AC
  Administered 2022-02-07 – 2022-02-13 (×13): 300 mg via ORAL
  Filled 2022-02-07 (×13): qty 1

## 2022-02-07 MED ORDER — DIBUCAINE 1 % EX OINT
TOPICAL_OINTMENT | CUTANEOUS | Status: DC | PRN
  Filled 2022-02-07: qty 28.35

## 2022-02-07 MED ORDER — LOPERAMIDE HCL 2 MG PO CAPS
2.0000 mg | ORAL_CAPSULE | Freq: Once | ORAL | Status: AC
  Administered 2022-02-07: 2 mg via ORAL
  Filled 2022-02-07: qty 1

## 2022-02-07 MED ORDER — SODIUM CHLORIDE 0.9% FLUSH: 10.0000 mL | INTRAVENOUS | Status: DC | PRN

## 2022-02-07 MED ORDER — METRONIDAZOLE 500 MG PO TABS
500.0000 mg | ORAL_TABLET | Freq: Two times a day (BID) | ORAL | Status: AC
  Administered 2022-02-07 – 2022-02-13 (×13): 500 mg via ORAL
  Filled 2022-02-07 (×13): qty 1

## 2022-02-07 MED ORDER — CHLORHEXIDINE GLUCONATE CLOTH 2 % EX PADS
6.0000 | MEDICATED_PAD | Freq: Every day | CUTANEOUS | Status: DC
  Administered 2022-02-07 – 2022-02-17 (×11): 6 via TOPICAL

## 2022-02-07 MED ORDER — SODIUM CHLORIDE 0.9% FLUSH
10.0000 mL | Freq: Two times a day (BID) | INTRAVENOUS | Status: DC
  Administered 2022-02-09 – 2022-02-17 (×17): 10 mL

## 2022-02-07 NOTE — Plan of Care (Signed)
Pt continues having rectal pain this shift. Pain stated as a 10. This nurse was unable to see an redness, fissures, or hemorrhoids upon inspection. Problem: Education: Goal: Knowledge of General Education information will improve Description: Including pain rating scale, medication(s)/side effects and non-pharmacologic comfort measures Outcome: Progressing   Problem: Health Behavior/Discharge Planning: Goal: Ability to manage health-related needs will improve Outcome: Progressing   Problem: Clinical Measurements: Goal: Ability to maintain clinical measurements within normal limits will improve Outcome: Progressing Goal: Will remain free from infection Outcome: Progressing Goal: Diagnostic test results will improve Outcome: Progressing Goal: Respiratory complications will improve Outcome: Progressing Goal: Cardiovascular complication will be avoided Outcome: Progressing   Problem: Activity: Goal: Risk for activity intolerance will decrease Outcome: Progressing   Problem: Nutrition: Goal: Adequate nutrition will be maintained Outcome: Progressing   Problem: Coping: Goal: Level of anxiety will decrease Outcome: Progressing   Problem: Elimination: Goal: Will not experience complications related to bowel motility Outcome: Progressing Goal: Will not experience complications related to urinary retention Outcome: Progressing   Problem: Pain Managment: Goal: General experience of comfort will improve Outcome: Progressing   Problem: Safety: Goal: Ability to remain free from injury will improve Outcome: Progressing   Problem: Skin Integrity: Goal: Risk for impaired skin integrity will decrease Outcome: Progressing   Problem: Education: Goal: Knowledge of risk factors and measures for prevention of condition will improve Outcome: Progressing   Problem: Coping: Goal: Psychosocial and spiritual needs will be supported Outcome: Progressing   Problem: Respiratory: Goal:  Will maintain a patent airway Outcome: Progressing Goal: Complications related to the disease process, condition or treatment will be avoided or minimized Outcome: Progressing

## 2022-02-07 NOTE — Progress Notes (Signed)
Bilateral lower extremity venous duplex completed. Refer to "CV Proc" under chart review to view preliminary results.  02/07/2022 12:34 PM Kelby Aline., MHA, RVT, RDCS, RDMS

## 2022-02-07 NOTE — Progress Notes (Signed)
[name]   DOB:March 28, 1956   OE#:[id number]    ASSESSMENT & PLAN:  Recurrent uterine cancer I have reviewed her CT imaging Lymphadenopathy is stable Continue supportive care  Acquired pancytopenia Likely exacerbated by recent antibiotics and recent chemotherapy Monitor closely Consider blood transfusion support due to history of elevated troponin and heart failure  Elevated troponin, history of heart failure Could be exacerbated by recent use of trastuzumab I will order another echocardiogram  Diverticulitis She continues to have severe abdominal pain and not eating She will continue taking pain medicine and antibiotics If not better, consider consulting GI  Severe protein calorie malnutrition She has not been eating well for the past month, in fact since last hospitalization from January She has failed outpatient IV fluid support, IV pain medicine and IV Zofran We will start calorie count If she does not meet dietary requirements, do not discharge She might need total parenteral nutrition  Recent fall She is very debilitated Consider PT while hospitalized  COVID positivity She is at extremely high risk due to immunocompromise state from recent chemotherapy, history of congestive heart failure and now severe pancytopenia Continue Paxlovid  Discharge planning She is not ready to be discharged She does not have established primary care doctor She does not qualify for home health due to lack of insurance Due to COVID positivity, I cannot bring her back to the cancer center for further supportive care and she has failed outpatient therapy prior to admission Please do not discharge her until she is able to eat and drink reasonably normal.  Premature discharge from hospital could lead to death Her daughter is updated  All questions were answered. The patient knows to call the clinic with any problems, questions or concerns.   The total time spent in the appointment was 55  minutes encounter with patients including review of chart and various tests results, discussions about plan of care and coordination of care plan  Heath Lark, MD 02/07/2022 9:36 AM  Subjective:  The patient is seen in the room.  She continues to have intermittent abdominal pain and inability to eat due to severe bloating after attempt to eat.  She reported recent vaginal bleeding to the hospitalist.  She told me she has vaginal pain.  She denies nausea.  She has loose stool  Objective:  Vitals:   02/06/22 2104 02/07/22 0440  BP: (!) 146/66 (!) 137/54  Pulse: 73 68  Resp: 20 17  Temp: 98.5 F (36.9 C) 97.9 F (36.6 C)  SpO2: 97% 94%     Intake/Output Summary (Last 24 hours) at 02/07/2022 1829 Last data filed at 02/06/2022 1300 Gross per 24 hour  Intake 120 ml  Output --  Net 120 ml    GENERAL:alert, no distress and comfortable SKIN: skin color, texture, turgor are normal, no rashes or significant lesions EYES: normal, Conjunctiva are pink and non-injected, sclera clear OROPHARYNX:no exudate, no erythema and lips, buccal mucosa, and tongue normal  NECK: supple, thyroid normal size, non-tender, without nodularity LYMPH:  no palpable lymphadenopathy in the cervical, axillary or inguinal LUNGS: clear to auscultation and percussion with normal breathing effort HEART: regular rate & rhythm and no murmurs and no lower extremity edema ABDOMEN:abdomen soft, diffuse tenderness without rebound or guarding Musculoskeletal:no cyanosis of digits and no clubbing  NEURO: alert & oriented x 3 with fluent speech, no focal motor/sensory deficits   Labs:  Recent Labs    02/05/22 0530 02/06/22 0532 02/06/22 2019 02/07/22 0518  NA 135 135  138 135  K 3.4* 3.2* 4.2 3.8  CL 105 108 110 111  CO2 23 22 22  19*  GLUCOSE 83 119* 131* 113*  BUN 9 9 7* 7*  CREATININE 1.00 0.88 1.11* 1.06*  CALCIUM 8.1* 7.7* 8.1* 7.8*  GFRNONAA >60 >60 56* 59*  PROT 6.0* 5.6*  --  5.8*  ALBUMIN 2.5* 2.3*  --   2.3*  AST 18 42*  --  44*  ALT 19 35  --  40  ALKPHOS 79 169*  --  205*  BILITOT 0.4 0.5  --  0.3    Studies: I have reviewed her CT imaging DG Shoulder Right  Result Date: 02/04/2022 CLINICAL DATA:  Uterine cancer.  Diarrhea.  Right shoulder pain. EXAM: RIGHT SHOULDER - 2+ VIEW COMPARISON:  None. FINDINGS: There is diffuse decreased bone mineralization. Mild acromioclavicular joint space narrowing with mild inferior degenerative osteophytes. Mild inferior glenoid degenerative osteophytosis. No acute fracture is seen. No dislocation. Partial visualization of right chest port a catheter. 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 Electronically Signed   By: Yvonne Kendall M.D.   On: 02/04/2022 19:08   CT Head Wo Contrast  Result Date: 02/04/2022 CLINICAL DATA:  Mental status change, unknown cause EXAM: CT HEAD WITHOUT CONTRAST TECHNIQUE: Contiguous axial images were obtained from the base of the skull through the vertex without intravenous contrast. RADIATION DOSE REDUCTION: This exam was performed according to the departmental dose-optimization program which includes automated exposure control, adjustment of the mA and/or kV according to patient size and/or use of iterative reconstruction technique. COMPARISON:  None. FINDINGS: Brain: There is no acute intracranial hemorrhage, mass effect, or edema. Gray-white differentiation is preserved. Small focus of low density in the white matter adjacent to the posterior body of the left lateral ventricle. There is no extra-axial fluid collection. Ventricles and sulci are within normal limits in size and configuration. Vascular: There is atherosclerotic calcification at the skull base. Skull: Calvarium is unremarkable. Sinuses/Orbits: No acute finding. Other: None. IMPRESSION: No acute intracranial hemorrhage. 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  Electronically Signed   By: Macy Mis M.D.   On: 02/04/2022 16:55   CT Abdomen Pelvis W Contrast  Result Date: 02/04/2022 CLINICAL DATA:  Abdominal pain.  History of uterine cancer EXAM: CT ABDOMEN AND PELVIS WITH CONTRAST TECHNIQUE: Multidetector CT imaging of the abdomen and pelvis was performed using the standard protocol following bolus administration of intravenous contrast. RADIATION DOSE REDUCTION: This exam was performed according to the departmental dose-optimization program which includes automated exposure control, adjustment of the mA and/or kV according to patient size and/or use of iterative reconstruction technique. CONTRAST:  124mL OMNIPAQUE IOHEXOL 300 MG/ML  SOLN COMPARISON:  01/28/2022 FINDINGS: Lower chest: Included lung bases are clear. Heart size within normal limits. Hepatobiliary: No focal liver abnormality is seen. No gallstones, gallbladder wall thickening, or biliary dilatation. Pancreas: Unremarkable. No pancreatic ductal dilatation or surrounding inflammatory changes. Spleen: Normal in size without focal abnormality. Adrenals/Urinary Tract: Unremarkable adrenal glands. Kidneys enhance symmetrically without focal lesion, stone, or hydronephrosis. Ureters are nondilated. Urinary bladder appears unremarkable. Stomach/Bowel: Colonic diverticulosis with new mild pericolonic fat stranding adjacent to a prominent diverticula at the proximal sigmoid colon where there is short segment bowel thickening (series 5, images 66-68). Scattered diverticulosis within the remaining sigmoid and descending colon. No additional sites of focal inflammation. Normal appendix in the right lower quadrant. No dilated loops of bowel. Stomach within normal limits.  Vascular/Lymphatic: Scattered atherosclerotic calcifications throughout the aortoiliac axis. Stable mildly enlarged left pelvic sidewall lymph node measuring approximately 1.3 cm short axis (series 5, image 74). Reproductive: Prior hysterectomy.  Stable prominence at the vaginal cuff, indeterminate. No adnexal masses. Other: Mild presacral fluid/edema. No organized abdominopelvic fluid collection. No pneumoperitoneum. Musculoskeletal: No new or acute bony findings. IMPRESSION: 1. Acute uncomplicated sigmoid diverticulitis. 2. Prior hysterectomy with stable prominence at the vaginal cuff, indeterminate. Stable mildly enlarged left pelvic sidewall lymph node. Aortic Atherosclerosis (ICD10-I70.0). Electronically Signed   By: Davina Poke D.O.   On: 02/04/2022 16:58   CT Abdomen Pelvis W Contrast  Result Date: 01/28/2022 CLINICAL DATA:  Epigastric abdominal pain, shortness of breath, nausea vomiting, endometrial carcinoma receiving chemotherapy EXAM: CT ABDOMEN AND PELVIS WITH CONTRAST TECHNIQUE: Multidetector CT imaging of the abdomen and pelvis was performed using the standard protocol following bolus administration of intravenous contrast. RADIATION DOSE REDUCTION: This exam was performed according to the departmental dose-optimization program which includes automated exposure control, adjustment of the mA and/or kV according to patient size and/or use of iterative reconstruction technique. CONTRAST:  134mL OMNIPAQUE IOHEXOL 300 MG/ML  SOLN COMPARISON:  01/07/2022 FINDINGS: Lower chest: No acute abnormality. Hepatobiliary: No focal liver abnormality is seen. No gallstones, gallbladder wall thickening, or biliary dilatation. Pancreas: Unremarkable. No pancreatic ductal dilatation or surrounding inflammatory changes. Spleen: Normal in size without focal abnormality. Adrenals/Urinary Tract: Adrenal glands are unremarkable. Kidneys are normal, without renal calculi, focal lesion, or hydronephrosis. Bladder is unremarkable. Stomach/Bowel: Similar nonspecific thickening of the duodenal C loop may be related to under distension versus improving duodenitis when compared to 01/07/2022. Negative for bowel obstruction, significant dilatation, ileus, or free air.  Normal retrocecal appendix identified. Scattered colonic diverticulosis without acute inflammatory process. No free fluid, fluid collection, hemorrhage, hematoma, abscess or ascites. Vascular/Lymphatic: Aortic atherosclerosis noted without aneurysm, dissection or occlusive process. No retroperitoneal hemorrhage or hematoma. Mesenteric and renal vasculature appears to remain patent. No veno-occlusive process. Stable left iliac pelvic sidewall mild adenopathy measuring 1.4 cm in diameter, image 70/2. No inguinal adenopathy. Reproductive: Previous hysterectomy. Stable indeterminate soft tissue prominence of the vaginal cuff. No pelvic free fluid, fluid collection, hemorrhage or hematoma. No adnexal mass. Other: No abdominal wall hernia or abnormality. No abdominopelvic ascites. Musculoskeletal: Degenerative changes throughout the spine with lower lumbar facet arthropathy. No acute osseous finding. IMPRESSION: No acute intra-abdominal or pelvic finding by CT. Interval improvement in the previous mild duodenitis changes by CT when compared to 01/07/2022. Colonic diverticulosis without acute inflammatory process. Normal appendix Remote hysterectomy. Stable indeterminate soft tissue prominence of the vaginal cuff and left pelvic sidewall mild adenopathy, suspicious for residual disease. Aortic Atherosclerosis (ICD10-I70.0). Electronically Signed   By: Jerilynn Mages.  Shick M.D.   On: 01/28/2022 17:41   DG Chest Port 1 View  Result Date: 02/04/2022 CLINICAL DATA:  History of uterine cancer. Diarrhea. Unable to eat for 4 days. EXAM: PORTABLE CHEST 1 VIEW COMPARISON:  January 28, 2022 FINDINGS: The heart size and mediastinal contours are stable. Right central venous line is unchanged. Both lungs are clear. The visualized skeletal structures are unremarkable. IMPRESSION: No active disease. Electronically Signed   By: Abelardo Diesel M.D.   On: 02/04/2022 14:20   DG Chest Port 1 View  Result Date: 01/28/2022 CLINICAL DATA:  Dyspnea,  uterine cancer on chemotherapy EXAM: PORTABLE CHEST 1 VIEW COMPARISON:  01/04/2022 chest radiograph. FINDINGS: Right internal jugular Port-A-Cath terminates in the lower third of the SVC. Stable cardiomediastinal silhouette with normal heart size.  No pneumothorax. No pleural effusion. Lungs appear clear, with no acute consolidative airspace disease and no pulmonary edema. IMPRESSION: No active disease. Electronically Signed   By: Ilona Sorrel M.D.   On: 01/28/2022 15:18

## 2022-02-07 NOTE — Progress Notes (Signed)
Occupational Therapy Treatment Patient Details Name: [name] MRN: [id number] DOB: 31-Jul-1956 Today's Date: 02/07/2022   History of present illness 66 y.o. female with medical history significant of endometrial cancer on chemotherapy, hypertension, hyperlipidemia, GERD, diabetes. Patient presented secondary to being "out of it," having "loose bowels," chest pain, poor appetite. Dx of diverticulitis, incidental COVID finding.   OT comments  Patient agreeable to OT. Ambulating at supervision level with rolling walker and able to transfer without physical assistance. Patient reporting pain is improved in R shoulder, AROM WFL. Patient progressing well towards acute OT goals.   Recommendations for follow up therapy are one component of a multi-disciplinary discharge planning process, led by the attending physician.  Recommendations may be updated based on patient status, additional functional criteria and insurance authorization.    Follow Up Recommendations  No OT follow up    Assistance Recommended at Discharge PRN  Patient can return home with the following  Direct supervision/assist for medications management;Direct supervision/assist for financial management;Assistance with cooking/housework   Equipment Recommendations  None recommended by OT       Precautions / Restrictions Precautions Type of Shoulder Precautions: R shoulder pain (due to recent fall) Restrictions Weight Bearing Restrictions: No       Mobility Bed Mobility Overal bed mobility: Independent                     Balance Overall balance assessment: Modified Independent                                         ADL either performed or assessed with clinical judgement   ADL Overall ADL's : Needs assistance/impaired Eating/Feeding: Independent                   Lower Body Dressing: Independent;Sitting/lateral leans Lower Body Dressing Details (indicate cue type and reason):  To don shoes Toilet Transfer: Supervision/safety;Ambulation;Rolling walker (2 wheels) Toilet Transfer Details (indicate cue type and reason): Patient able to ambulate around room and transfer to recliner chair with rolling walker and no physical assistance         Functional mobility during ADLs: Supervision/safety      Extremity/Trunk Assessment Upper Extremity Assessment Upper Extremity Assessment: RUE deficits/detail RUE Deficits / Details: Patient AROM WFL and reports no pain today. Educated patient in gentle ROM/stretching if pain reoccurs             Cognition Arousal/Alertness: Awake/alert Behavior During Therapy: WFL for tasks assessed/performed Overall Cognitive Status: Within Functional Limits for tasks assessed                                                     Pertinent Vitals/ Pain       Pain Assessment Pain Assessment: No/denies pain         Frequency  Min 1X/week        Progress Toward Goals  OT Goals(current goals can now be found in the care plan section)  Progress towards OT goals: Progressing toward goals  Acute Rehab OT Goals Patient Stated Goal: Home soon OT Goal Formulation: With patient Time For Goal Achievement: 02/19/22 Potential to Achieve Goals: Good  Plan Discharge plan remains appropriate  AM-PAC OT "6 Clicks" Daily Activity     Outcome Measure   Help from another person eating meals?: None Help from another person taking care of personal grooming?: A Little Help from another person toileting, which includes using toliet, bedpan, or urinal?: A Little Help from another person bathing (including washing, rinsing, drying)?: A Little Help from another person to put on and taking off regular upper body clothing?: A Little Help from another person to put on and taking off regular lower body clothing?: A Little 6 Click Score: 19    End of Session Equipment Utilized During Treatment: Rolling walker (2  wheels)  OT Visit Diagnosis: Unsteadiness on feet (R26.81);Other abnormalities of gait and mobility (R26.89)   Activity Tolerance Patient tolerated treatment well   Patient Left in chair;with call bell/phone within reach   Nurse Communication Mobility status        Time: 1259-1315 OT Time Calculation (min): 16 min  Charges: OT General Charges $OT Visit: 1 Visit OT Treatments $Self Care/Home Management : 8-22 mins  Delbert Phenix OT OT pager: [place] 02/07/2022, 2:16 PM

## 2022-02-07 NOTE — Progress Notes (Signed)
PROGRESS NOTE    [name]  IHK:[id number] DOB: 06-08-1956 DOA: 02/04/2022 PCP: [name], MD   Brief Narrative: [name] is a 66 y.o. female with a history of endometrial cancer on chemotherapy, hypertension, hyperlipidemia, GERD, diabetes. Patient presented secondary to diarrhea and confusion. Workup in the ED revealed evidence of acute diverticulitis. She was started on empiric antibiotics. Diarrhea of uncertain etiology at this time. IV fluids initiated. Also incidentally found to have COVID-19 infection, on room air and without pulmonary infiltrates. Started on Paxlovid. CRP significantly elevated.   Assessment and Plan: * Acute diverticulitis Mild on imaging. Located in sigmoid colon. Patient given Zosyn IV empirically in the ED. Blood cultures obtained and are no growth to date. Blood cultures negative. Patient was switched to oral Augmentin.  We will continue with ongoing diarrhea we will switch to [facility] and Flagyl.  Decreased oral intake Possibly related to acute illness vs cancer therapy. No physical impediments. -Dietitician consulted for calorie count.  Right shoulder pain Likely related to fall. Good passive range of motion but poor active strength. -Shoulder x-ray negative for any acute fracture. -OT eval  Thrombocytopenia (HCC) Possibly related to chemotherapy. Patient is just under two weeks out from last treatment. -CBC daily Appreciate oncology assistance. Lower extremity Doppler negative for any DVT.  COVID-19 virus infection Incidental. Asymptomatic. Chest x-ray clear. Patient is immunocompromised secondary to known cancer on chemotherapy. Diagnosed on 2/20 and will need isolation for 21 days. CRP significantly elevated; unsure if related to COVID-19 vs underlying chronic illness -Paxlovid x5 days  Acute diarrhea Patient with a two week persistent course of diarrhea. Associated incontinence. Watery stools. Family states they were told it is  not secondary to recently received chemotherapy. Afebrile and no leukocytosis. Immune compromised. Diarrhea now seems to be improving. C. Difficile collection attempted but failed secondary to inappropriate stool consistency. GI pathogen panel negative. 1 dose of Imodium.  Ideally would benefit from cholestyramine if continues to have ongoing diarrhea. Treated with IV fluid.  Anemia due to antineoplastic chemotherapy- (present on admission) Baseline hemoglobin is around 10. Slightly lower at 8.4 on admission and is stable. No evidence of bleeding. Asymptomatic.  Chronic kidney disease, stage 3a ([place])- (present on admission) Stable.  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 Alvy Bimler and is currently receiving chemotherapy. Last treatment on 01/22/22. Oncology following.  Chronic diastolic heart failure ([place])- (present on admission) No evidence of exacerbation. History of normal LVEF with grade 1 diastolic dysfunction. Patient is on lasix as an outpatient -Hold Lasix while having diarrhea/increased fluid losses  Dyslipidemia- (present on admission) Patient is on Lipitor as an outpatient. -Hold Lipitor while on Paxlovid  Obesity (BMI 30-39.9)- (present on admission) Body mass index is 39.32 kg/m.  Hypertension- (present on admission) Patient is on Imdur, lisinopril and metoprolol as an outpatient. Blood pressure significantly uncontrolled. -Continue home metoprolol -Restart home lisinopril and Imdur  Uncontrolled type 2 diabetes mellitus with hyperglycemia, without long-term current use of insulin ([place])- (present on admission) Patient is on Trulicity, glimepiride and metformin as an outpatient. Last hemoglobin A1C of 7.9%. Uncontrolled. -Hold home regimen -SSI while inpatient    DVT prophylaxis: SCDs Code Status:   Code Status: Full Code Family Communication: None at bedside Disposition Plan: Concern with ongoing  diarrhea.  Plan discharge home.   Consultants:  Medical oncology  Procedures:  None  Antimicrobials: Zosyn IV    Subjective: No further bleeding.  Reports diarrhea.  No nausea  no vomiting.  No blood in the stool.  No blood in the urine.  Objective: BP (!) 132/56 (BP Location: Right Wrist)    Pulse 71    Temp 98.1 F (36.7 C)    Resp 16    Ht 5\' 2"  (1.575 m)    Wt 97.5 kg    SpO2 95%    BMI 39.32 kg/m   Examination: General: Appear in mild distress, no Rash; Oral Mucosa Clear, moist. no Abnormal Neck Mass Or lumps, Conjunctiva normal  Cardiovascular: S1 and S2 Present, no Murmur, Respiratory: good respiratory effort, Bilateral Air entry present and CTA, no Crackles, no wheezes Abdomen: Bowel Sound present, Soft and mild diffuse tenderness Extremities: no Pedal edema Neurology: alert and oriented to time, place, and person affect appropriate. no new focal deficit Gait not checked due to patient safety concerns    Data Reviewed:  I have Reviewed nursing notes, Vitals, and Lab results since pt's last encounter. Pertinent lab results CBC and BMP and CRP I have ordered test including CBC BMP CRP I have discussed pt's care plan and test results with oncology.    Author:  Berle Mull, MD [facility] 02/07/2022  9:02 PM   To reach On-call, see care teams to locate the attending and reach out to them via [email or web address]. If 7PM-7AM, please contact night-coverage If you still have difficulty reaching the attending provider, please page the [facility] (Director on Call) for [facility] on amion for assistance.

## 2022-02-08 LAB — D-DIMER, QUANTITATIVE: D-Dimer, Quant: 3.1 ug/mL-FEU — ABNORMAL HIGH (ref 0.00–0.50)

## 2022-02-08 LAB — CBC WITH DIFFERENTIAL/PLATELET
Abs Immature Granulocytes: 0.23 10*3/uL — ABNORMAL HIGH (ref 0.00–0.07)
Basophils Absolute: 0 10*3/uL (ref 0.0–0.1)
Basophils Relative: 1 %
Eosinophils Absolute: 0 10*3/uL (ref 0.0–0.5)
Eosinophils Relative: 0 %
HCT: 22.9 % — ABNORMAL LOW (ref 36.0–46.0)
Hemoglobin: 7.4 g/dL — ABNORMAL LOW (ref 12.0–15.0)
Immature Granulocytes: 5 %
Lymphocytes Relative: 31 %
Lymphs Abs: 1.3 10*3/uL (ref 0.7–4.0)
MCH: 28.8 pg (ref 26.0–34.0)
MCHC: 32.3 g/dL (ref 30.0–36.0)
MCV: 89.1 fL (ref 80.0–100.0)
Monocytes Absolute: 0.6 10*3/uL (ref 0.1–1.0)
Monocytes Relative: 14 %
Neutro Abs: 2.1 10*3/uL (ref 1.7–7.7)
Neutrophils Relative %: 49 %
Platelets: 59 10*3/uL — ABNORMAL LOW (ref 150–400)
RBC: 2.57 MIL/uL — ABNORMAL LOW (ref 3.87–5.11)
RDW: 15.2 % (ref 11.5–15.5)
WBC: 4.3 10*3/uL (ref 4.0–10.5)
nRBC: 0 % (ref 0.0–0.2)

## 2022-02-08 LAB — COMPREHENSIVE METABOLIC PANEL
ALT: 50 U/L — ABNORMAL HIGH (ref 0–44)
AST: 52 U/L — ABNORMAL HIGH (ref 15–41)
Albumin: 2.4 g/dL — ABNORMAL LOW (ref 3.5–5.0)
Alkaline Phosphatase: 337 U/L — ABNORMAL HIGH (ref 38–126)
Anion gap: 6 (ref 5–15)
BUN: 7 mg/dL — ABNORMAL LOW (ref 8–23)
CO2: 22 mmol/L (ref 22–32)
Calcium: 8.1 mg/dL — ABNORMAL LOW (ref 8.9–10.3)
Chloride: 110 mmol/L (ref 98–111)
Creatinine, Ser: 1.02 mg/dL — ABNORMAL HIGH (ref 0.44–1.00)
GFR, Estimated: >60 mL/min (ref >60)
Glucose, Bld: 162 mg/dL — ABNORMAL HIGH (ref 70–99)
Potassium: 3.7 mmol/L (ref 3.5–5.1)
Sodium: 138 mmol/L (ref 135–145)
Total Bilirubin: 0.5 mg/dL (ref 0.3–1.2)
Total Protein: 6.3 g/dL — ABNORMAL LOW (ref 6.5–8.1)

## 2022-02-08 LAB — GLUCOSE, CAPILLARY
Glucose-Capillary: 120 mg/dL — ABNORMAL HIGH (ref 70–99)
Glucose-Capillary: 172 mg/dL — ABNORMAL HIGH (ref 70–99)
Glucose-Capillary: 159 mg/dL — ABNORMAL HIGH (ref 70–99)
Glucose-Capillary: 128 mg/dL — ABNORMAL HIGH (ref 70–99)

## 2022-02-08 LAB — C-REACTIVE PROTEIN: CRP: 11.7 mg/dL — ABNORMAL HIGH (ref <1.0)

## 2022-02-08 MED ORDER — ENOXAPARIN SODIUM 40 MG/0.4ML IJ SOSY
40.0000 mg | PREFILLED_SYRINGE | Freq: Every day | INTRAMUSCULAR | Status: DC
  Administered 2022-02-08 – 2022-02-18 (×11): 40 mg via SUBCUTANEOUS
  Filled 2022-02-08 (×11): qty 0.4

## 2022-02-08 MED ORDER — CHOLESTYRAMINE LIGHT 4 G PO PACK
4.0000 g | PACK | Freq: Every day | ORAL | Status: DC
Start: 2022-02-08 — End: 2022-02-18
  Administered 2022-02-08 – 2022-02-15 (×5): 4 g via ORAL
  Filled 2022-02-08 (×11): qty 1

## 2022-02-08 MED ORDER — AMLODIPINE BESYLATE 5 MG PO TABS
5.0000 mg | ORAL_TABLET | Freq: Every day | ORAL | Status: DC
  Administered 2022-02-08 – 2022-02-18 (×11): 5 mg via ORAL
  Filled 2022-02-08 (×10): qty 1

## 2022-02-08 NOTE — Progress Notes (Signed)
Progress Note   Patient: FOREST PRUDEN OJJ:[id number] DOB: Jul 11, 1956 DOA: 02/04/2022     Hospitalization day: 3 DOS: the patient was seen and examined on 02/08/2022   Brief hospital course: CORNELLA EMMER is a 66 y.o. female with a history of endometrial cancer on chemotherapy, hypertension, hyperlipidemia, GERD, diabetes. Patient presented secondary to diarrhea and confusion. Workup in the ED revealed evidence of acute diverticulitis. She was started on empiric antibiotics. Diarrhea of uncertain etiology at this time. IV fluids initiated. Also incidentally found to have COVID-19 infection, on room air and without pulmonary infiltrates. Started on Paxlovid. CRP significantly elevated.  Assessment and Plan: * Acute diverticulitis- (present on admission) CT scan shows mild sigmoid colon diverticulitis. Started on IV Zosyn. Later on transition to oral Augmentin. Now we will switch to [facility] and Flagyl due to concern with ongoing diarrhea. Cultures so far negative. GI pathogen panel negative. C. difficile collection was attempted but failed. Treated with Questran. Stop IV fluid.  Anemia due to antineoplastic chemotherapy- (present on admission) Thrombocytopenia. Chemotherapy-induced pancytopenia. Oncology consulted appreciate assistance. Monitor for hemoglobin and platelet stability. Transfuse for hemoglobin less than 7. Per oncology okay to resume DVT prophylaxis as long as platelets remain above 50.  COVID-19 virus infection- (present on admission) Incidental. Asymptomatic. Chest x-ray clear. Patient is immunocompromised secondary to known cancer on chemotherapy. Diagnosed on 2/20 and will need isolation for 21 days. CRP significantly elevated; unsure if related to COVID-19 vs underlying chronic illness -Paxlovid x5 days  Chronic kidney disease, stage 3a ([name])- (present on admission) Baseline serum creatinine around 1.1. Currently serum creatinine 1.02. Remaining stable.  Holding  lisinopril.  Papillary serous adenocarcinoma of endometrium [place])- (present on admission) Patient follows with Dr. Alvy Bimler and is currently receiving chemotherapy. Last treatment on 01/22/22. Appreciate Dr. Marrian Salvage assistance. Patient reported 1 episode of vaginal bleeding on 2/22.  No further bleeding reported after that.  Uncontrolled type 2 diabetes mellitus with hyperglycemia, without long-term current use of insulin ([place])- (present on admission) Patient is on Trulicity, glimepiride and metformin as an outpatient. Last hemoglobin A1C of 7.9%. Uncontrolled. -Hold home regimen -SSI while inpatient  Right shoulder pain- (present on admission) Shoulder XR negative. Monitor.  Chronic diastolic heart failure ([place])- (present on admission) No evidence of exacerbation. History of normal LVEF with grade 1 diastolic dysfunction. Patient is on lasix as an outpatient -Hold Lasix while having diarrhea/increased fluid losses  Dyslipidemia- (present on admission) Patient is on Lipitor as an outpatient. -Hold Lipitor while on Paxlovid  Obesity (BMI 30-39.9)- (present on admission) Body mass index is 39.32 kg/m.  Placing the patient at high risk of poor outcome.  Hypertension- (present on admission) Patient is on Imdur, lisinopril and metoprolol as an outpatient. Blood pressure significantly uncontrolled. -Continue home metoprolol and Imdur.  Hold lisinopril.        Subjective: Continue to report diarrhea.  No nausea no vomiting.  Minimal oral intake with hypoglycemic episode yesterday.  Physical Exam: Vitals:   02/07/22 1300 02/07/22 2136 02/08/22 0521 02/08/22 1147  BP: (!) 132/56 (!) 187/64 (!) 172/75 (!) 144/69  Pulse: 71 77 72 66  Resp: 16 16 18 20   Temp: 98.1 F (36.7 C) 98.1 F (36.7 C) (!) 97.5 F (36.4 C) 97.7 F (36.5 C)  TempSrc:   Oral Oral  SpO2: 95% 97% 97% 96%  Weight:      Height:       General: Appear in mild distress; no visible Abnormal Neck Mass Or lumps,  Conjunctiva normal  Cardiovascular: S1 and S2 Present, no Murmur, Respiratory: good respiratory effort, Bilateral Air entry present and CTA, no Crackles, no wheezes Abdomen: Bowel Sound present Extremities: no Pedal edema Neurology: alert and oriented to time, place, and person Gait not checked due to patient safety concerns   Data Reviewed:  I have Reviewed nursing notes, Vitals, and Lab results since pt's last encounter. Pertinent lab results CBC, BMP I have ordered test including CBC, BMP, CRP I have reviewed the last note from oncology,  I have discussed pt's care plan and test results with oncology.   Family Communication: None at bedside, will discuss with daughter tomorrow.  Disposition: Status is: Inpatient Remains inpatient appropriate because: Ongoing concerns with poor p.o. intake with hypoglycemic episodes.  Author: Berle Mull, MD 02/08/2022 6:59 PM  For on call review [email or web address].

## 2022-02-08 NOTE — Plan of Care (Signed)
°  Problem: Education: Goal: Knowledge of General Education information will improve Description: Including pain rating scale, medication(s)/side effects and non-pharmacologic comfort measures Outcome: Progressing   Problem: Health Behavior/Discharge Planning: Goal: Ability to manage health-related needs will improve Outcome: Progressing   Problem: Clinical Measurements: Goal: Ability to maintain clinical measurements within normal limits will improve Outcome: Progressing Goal: Respiratory complications will improve Outcome: Progressing   Problem: Activity: Goal: Risk for activity intolerance will decrease Outcome: Progressing   Problem: Nutrition: Goal: Adequate nutrition will be maintained Outcome: Progressing   Problem: Pain Managment: Goal: General experience of comfort will improve Outcome: Progressing   Problem: Safety: Goal: Ability to remain free from injury will improve Outcome: Progressing   Problem: Skin Integrity: Goal: Risk for impaired skin integrity will decrease Outcome: Progressing   Problem: Education: Goal: Knowledge of risk factors and measures for prevention of condition will improve Outcome: Progressing

## 2022-02-08 NOTE — Progress Notes (Signed)
Physical Therapy Treatment Patient Details Name: [name] MRN: [id number] DOB: Jul 29, 1956 Today's Date: 02/08/2022   History of Present Illness 66 y.o. female with medical history significant of endometrial cancer on chemotherapy, hypertension, hyperlipidemia, GERD, diabetes. Patient presented secondary to being "out of it," having "loose bowels," chest pain, poor appetite. Dx of diverticulitis, incidental COVID finding.    PT Comments    Pt ambulated 15' x 2 (once with RW, once with IV pole), with seated rest break, distance limited by pelvic pressure. Instructed pt in seated BUE/LE exercises and encouraged her to perform these independently to minimize deconditioning during hospitalization.     Recommendations for follow up therapy are one component of a multi-disciplinary discharge planning process, led by the attending physician.  Recommendations may be updated based on patient status, additional functional criteria and insurance authorization.  Follow Up Recommendations  Home health PT     Assistance Recommended at Discharge Set up Supervision/Assistance  Patient can return home with the following A little help with bathing/dressing/bathroom;Help with stairs or ramp for entrance;Assistance with cooking/housework;Assist for transportation   Equipment Recommendations  Rollator (4 wheels)    Recommendations for Other Services       Precautions / Restrictions Precautions Precautions: Fall Type of Shoulder Precautions: R shoulder pain (due to recent fall) Restrictions Weight Bearing Restrictions: No     Mobility  Bed Mobility Overal bed mobility: Modified Independent Bed Mobility: Supine to Sit     Supine to sit: Modified independent (Device/Increase time)     General bed mobility comments: used bedrail    Transfers Overall transfer level: Needs assistance Equipment used: Rolling walker (2 wheels) Transfers: Sit to/from Stand Sit to Stand: Supervision            General transfer comment: supervision for safety due to recent fall    Ambulation/Gait Ambulation/Gait assistance: Supervision Gait Distance (Feet): 30 Feet (15' x 2 with seated rest break) Assistive device: Rolling walker (2 wheels), IV Pole Gait Pattern/deviations: Step-through pattern, Decreased stride length Gait velocity: decr     General Gait Details: 15' x 2 with seated rest break (first bout with RW, second bout with IV pole), distance limited by pelvic pressure ("It feels like the bottom is falling out"), no loss of balance   Stairs             Wheelchair Mobility    Modified Rankin (Stroke Patients Only)       Balance Overall balance assessment: Modified Independent                                          Cognition Arousal/Alertness: Awake/alert Behavior During Therapy: WFL for tasks assessed/performed Overall Cognitive Status: Within Functional Limits for tasks assessed                                          Exercises General Exercises - Upper Extremity Shoulder Flexion: AROM, Both, 10 reps, Seated General Exercises - Lower Extremity Ankle Circles/Pumps: AROM, Both, 10 reps, Seated Long Arc Quad: AROM, Both, 10 reps, Seated Hip Flexion/Marching: AROM, Both, 10 reps, Seated    General Comments        Pertinent Vitals/Pain Pain Assessment Pain Assessment: No/denies pain    Home Living  Prior Function            PT Goals (current goals can now be found in the care plan section) Acute Rehab PT Goals Patient Stated Goal: to be able to walk farther PT Goal Formulation: With patient Time For Goal Achievement: 02/20/22 Potential to Achieve Goals: Good Progress towards PT goals: Progressing toward goals    Frequency    Min 3X/week      PT Plan Current plan remains appropriate    Co-evaluation              AM-PAC PT "6 Clicks" Mobility   Outcome  Measure  Help needed turning from your back to your side while in a flat bed without using bedrails?: None Help needed moving from lying on your back to sitting on the side of a flat bed without using bedrails?: A Little Help needed moving to and from a bed to a chair (including a wheelchair)?: None Help needed standing up from a chair using your arms (e.g., wheelchair or bedside chair)?: None Help needed to walk in hospital room?: A Little Help needed climbing 3-5 steps with a railing? : A Little 6 Click Score: 21    End of Session Equipment Utilized During Treatment: Gait belt Activity Tolerance: Patient tolerated treatment well Patient left: in bed;with call bell/phone within reach Nurse Communication: Mobility status PT Visit Diagnosis: Difficulty in walking, not elsewhere classified (R26.2);Pain     Time: 1103-1594 PT Time Calculation (min) (ACUTE ONLY): 13 min  Charges:  $Gait Training: 8-22 mins                    Blondell Reveal Kistler PT 02/08/2022  Acute Rehabilitation Services Pager [phone] Office [phone]

## 2022-02-08 NOTE — Progress Notes (Addendum)
Nutrition Follow-up  DOCUMENTATION CODES:   Obesity unspecified  INTERVENTION:   -Continue to encourage good intakes  -Glucerna Shake po TID, each supplement provides 220 kcal and 10 grams of protein   NUTRITION DIAGNOSIS:   Increased nutrient needs related to cancer and cancer related treatments as evidenced by estimated needs.  Ongoing.  GOAL:   Patient will meet greater than or equal to 90% of their needs  Progressing.  MONITOR:   PO intake, Supplement acceptance, Labs, Weight trends, I & O's  REASON FOR ASSESSMENT:   Calorie Count  ASSESSMENT:   66 y.o. female with a history of endometrial cancer on chemotherapy, hypertension, hyperlipidemia, GERD, diabetes. Patient presented secondary to diarrhea and confusion. Workup in the ED revealed evidence of acute diverticulitis.  Calorie Count: 2/23 B: 240 kcals, 16g protein L: 315 kcals, 19g protein D: 345 kcals, 20g protein Supplements: sipping on Glucerna but not finishing them (~220 kcals, 10g protein)  Total x 24 hrs: 1120 kcals (64% of needs), 65g protein (76% of needs)  2/24 B: 430 kcals, 17g protein L: 450 kcals, 27g protein D: pt reports not eating, drank unsweet tea 0% Supplements: sipping on Glucerna but not finishing them (~220 kcals, 10g protein)  Total intake: 880 kcals (50% of needs),  44g protein (51% of needs)  **Addendum: completed calorie count for 2/24  Patient reports feeling like she will finish most of her lunch. States she plans to eat dinner  as well and feels like she is tolerating her meals at this time. States she is only sipping on Glucernas.   Suspect pt will get close to needs if intakes remain the same and she drinks at least the equivalent of 1 Glucerna daily.  Admission weight: 215 lbs. No other weights this admission.  Medications: Vitamin C, calcium carbonate, Multivitamin with minerals daily, Florastor  Labs reviewed: CBGs: 108-172  Diet Order:   Diet Order              Diet Carb Modified Fluid consistency: Thin; Room service appropriate? Yes  Diet effective now                   EDUCATION NEEDS:   No education needs have been identified at this time  Skin:  Skin Assessment: Reviewed RN Assessment  Last BM:  2/24  Height:   Ht Readings from Last 1 Encounters:  02/05/22 5\' 2"  (1.575 m)    Weight:   Wt Readings from Last 1 Encounters:  02/04/22 97.5 kg    BMI:  Body mass index is 39.32 kg/m.  Estimated Nutritional Needs:   Kcal:  8916-9450  Protein:  85-105g  Fluid:  2L/day   Elizabeth Bibles, MS, RD, LDN Inpatient Clinical Dietitian Contact information available via Amion

## 2022-02-08 NOTE — TOC Initial Note (Addendum)
Transition of Care [facility]) - Initial/Assessment Note    Patient Details  Name: [name] MRN: [id number] Date of Birth: 11-12-1956  Transition of Care [facility] CM/SW Contact:    Trish Mage, LCSW Phone Number: 02/08/2022, 11:10 AM  Clinical Narrative:   Oncology patient is recommended for [facility] PT.  Reached out to Amy at [facility] as they have [place] this week and requested their help.  Amy will get back to me. TOC will continue to follow during the course of hospitalization.  Addenduum: [facility] declined, saying "we have numerous patient's scheduled the first half of next week." If patient remains here on Tuesday, will request services from next agency up.                Expected Discharge Plan: [place] Barriers to Discharge: No Barriers Identified   Patient Goals and CMS Choice Patient states their goals for this hospitalization and ongoing recovery are:: go home CMS Medicare.gov Compare Post Acute Care list provided to:: Patient Choice offered to / list presented to : Patient  Expected Discharge Plan and Services Expected Discharge Plan: [place]   Discharge Planning Services: CM Consult   Living arrangements for the past 2 months: Single Family Home                                      Prior Living Arrangements/Services Living arrangements for the past 2 months: Single Family Home Lives with:: Adult Children Patient language and need for interpreter reviewed:: Yes        Need for Family Participation in Patient Care: Yes (Comment) Care giver support system in place?: Yes (comment)   Criminal Activity/Legal Involvement Pertinent to Current Situation/Hospitalization: No - Comment as needed  Activities of Daily Living Home Assistive Devices/Equipment: Cane (specify quad or straight), Eyeglasses (reading glasses) ADL Screening (condition at time of admission) Patient's cognitive ability adequate to safely complete daily  activities?: No (has had confusion per computer notes) Is the patient deaf or have difficulty hearing?: No Does the patient have difficulty seeing, even when wearing glasses/contacts?: No Does the patient have difficulty concentrating, remembering, or making decisions?: No Patient able to express need for assistance with ADLs?: Yes Does the patient have difficulty dressing or bathing?: No Independently performs ADLs?: No Communication: Independent Dressing (OT): Needs assistance Is this a change from baseline?: Change from baseline, expected to last <3days Grooming: Independent Feeding: Independent Bathing: Needs assistance Is this a change from baseline?: Change from baseline, expected to last <3 days Toileting: Needs assistance Is this a change from baseline?: Change from baseline, expected to last <3 days In/Out Bed: Needs assistance Is this a change from baseline?: Change from baseline, expected to last <3 days Walks in Home: Needs assistance Is this a change from baseline?: Change from baseline, expected to last <3 days Does the patient have difficulty walking or climbing stairs?: Yes Weakness of Legs: Both Weakness of Arms/Hands: Both  Permission Sought/Granted                  Emotional Assessment       Orientation: : Oriented to Self, Oriented to Place, Oriented to  Time, Oriented to Situation Alcohol / Substance Use: Not Applicable Psych Involvement: No (comment)  Admission diagnosis:  Diverticulitis [K57.92] Right shoulder pain [M25.511] Acute diverticulitis [K57.92] Diarrhea, unspecified type [R19.7] Malignant neoplasm of uterus, unspecified  site [facility]) [C55] COVID [U07.1] Patient Active Problem List   Diagnosis Date Noted   Acute diverticulitis 02/04/2022   Acute diarrhea 02/04/2022   COVID-19 virus infection 02/04/2022   Thrombocytopenia ([place]) 02/04/2022   Right shoulder pain 02/04/2022   Decreased oral intake 02/04/2022   Goals of care,  counseling/discussion 01/31/2022   Morbid obesity with BMI of 40.0-44.9, adult ([place])    Abdominal pain 01/08/2022   Chest pain 07/10/2021   Diabetes mellitus without complication ([place]) 62/56/3893   Peripheral neuropathy due to chemotherapy ([place]) 06/14/2021   Pancytopenia, acquired ([place]) 05/24/2021   Anemia due to antineoplastic chemotherapy 04/13/2021   Chronic kidney disease, stage 3a ([place]) 03/22/2021   Joint pain, knee 03/07/2021   Papillary serous adenocarcinoma of endometrium ([place])    Diabetes mellitus type 2 in obese ([place]) 06/25/2019   Angina pectoris ([place]) 06/19/2019   Viral gastroenteritis 06/19/2019   AKI (acute kidney injury) ([place]) 06/19/2019   Coronary artery disease involving native coronary artery of native heart with angina pectoris ([place])    Osteoarthritis of both hands 05/01/2016   Chronic diastolic heart failure ([place]) 12/06/2015   Non-compliance with treatment-(cost) 08/24/2015   Hypertensive cardiovascular disease 08/24/2015   Dyslipidemia 08/24/2015   CAD -S/P RCA DES 08/22/15    NSTEMI (non-ST elevated myocardial infarction) ([place]) 08/18/2015   Obesity (BMI 30-39.9) 06/15/2013   Uncontrolled type 2 diabetes mellitus with hyperglycemia, without long-term current use of insulin ([place]) 06/04/2013   Hypertension 06/04/2013   PCP:  [name], MD Pharmacy:   [facility], [place] [address] Phone: [phone] Fax: [place] [address] Phone: [phone] Fax: [phone]     Social Determinants of Health (SDOH) Interventions    Readmission Risk Interventions Readmission Risk Prevention Plan 01/10/2022  Transportation Screening Complete  PCP or Specialist Appt within 5-7 Days Complete  Home Care Screening Complete  Medication Review (RN CM) Complete  Some recent data might be hidden

## 2022-02-09 ENCOUNTER — Inpatient Hospital Stay (HOSPITAL_COMMUNITY): Payer: Medicaid Other

## 2022-02-09 DIAGNOSIS — K6289 Other specified diseases of anus and rectum: Secondary | ICD-10-CM | POA: Diagnosis present

## 2022-02-09 LAB — GLUCOSE, CAPILLARY
Glucose-Capillary: 160 mg/dL — ABNORMAL HIGH (ref 70–99)
Glucose-Capillary: 114 mg/dL — ABNORMAL HIGH (ref 70–99)
Glucose-Capillary: 194 mg/dL — ABNORMAL HIGH (ref 70–99)
Glucose-Capillary: 150 mg/dL — ABNORMAL HIGH (ref 70–99)
Glucose-Capillary: 202 mg/dL — ABNORMAL HIGH (ref 70–99)

## 2022-02-09 LAB — CBC WITH DIFFERENTIAL/PLATELET
Basophils Absolute: 0 10*3/uL (ref 0.0–0.1)
Basophils Relative: 0 %
Eosinophils Absolute: 0 10*3/uL (ref 0.0–0.5)
Eosinophils Relative: 1 %
HCT: 21.9 % — ABNORMAL LOW (ref 36.0–46.0)
Hemoglobin: 7.1 g/dL — ABNORMAL LOW (ref 12.0–15.0)
Lymphocytes Relative: 45 %
Lymphs Abs: 1.6 10*3/uL (ref 0.7–4.0)
MCH: 29.2 pg (ref 26.0–34.0)
MCHC: 32.4 g/dL (ref 30.0–36.0)
MCV: 90.1 fL (ref 80.0–100.0)
Monocytes Absolute: 0.2 10*3/uL (ref 0.1–1.0)
Monocytes Relative: 6 %
Neutro Abs: 1.7 10*3/uL (ref 1.7–7.7)
Neutrophils Relative %: 48 %
Platelets: 59 10*3/uL — ABNORMAL LOW (ref 150–400)
RBC: 2.43 MIL/uL — ABNORMAL LOW (ref 3.87–5.11)
RDW: 15.3 % (ref 11.5–15.5)
WBC: 3.6 10*3/uL — ABNORMAL LOW (ref 4.0–10.5)
nRBC: 0 % (ref 0.0–0.2)

## 2022-02-09 LAB — CBC
HCT: 25.4 % — ABNORMAL LOW (ref 36.0–46.0)
Hemoglobin: 8.2 g/dL — ABNORMAL LOW (ref 12.0–15.0)
MCH: 28.1 pg (ref 26.0–34.0)
MCHC: 32.3 g/dL (ref 30.0–36.0)
MCV: 87 fL (ref 80.0–100.0)
Platelets: 63 10*3/uL — ABNORMAL LOW (ref 150–400)
RBC: 2.92 MIL/uL — ABNORMAL LOW (ref 3.87–5.11)
RDW: 17.6 % — ABNORMAL HIGH (ref 11.5–15.5)
WBC: 4.4 10*3/uL (ref 4.0–10.5)
nRBC: 0.5 % — ABNORMAL HIGH (ref 0.0–0.2)

## 2022-02-09 LAB — COMPREHENSIVE METABOLIC PANEL
ALT: 53 U/L — ABNORMAL HIGH (ref 0–44)
AST: 54 U/L — ABNORMAL HIGH (ref 15–41)
Albumin: 2.4 g/dL — ABNORMAL LOW (ref 3.5–5.0)
Alkaline Phosphatase: 325 U/L — ABNORMAL HIGH (ref 38–126)
Anion gap: 7 (ref 5–15)
BUN: 6 mg/dL — ABNORMAL LOW (ref 8–23)
CO2: 22 mmol/L (ref 22–32)
Calcium: 8.2 mg/dL — ABNORMAL LOW (ref 8.9–10.3)
Chloride: 107 mmol/L (ref 98–111)
Creatinine, Ser: 0.9 mg/dL (ref 0.44–1.00)
GFR, Estimated: >60 mL/min (ref >60)
Glucose, Bld: 110 mg/dL — ABNORMAL HIGH (ref 70–99)
Potassium: 3.5 mmol/L (ref 3.5–5.1)
Sodium: 136 mmol/L (ref 135–145)
Total Bilirubin: 0.4 mg/dL (ref 0.3–1.2)
Total Protein: 6 g/dL — ABNORMAL LOW (ref 6.5–8.1)

## 2022-02-09 LAB — PROTIME-INR
INR: 1.2 (ref 0.8–1.2)
Prothrombin Time: 15 seconds (ref 11.4–15.2)

## 2022-02-09 LAB — CULTURE, BLOOD (ROUTINE X 2)
Culture: NO GROWTH
Culture: NO GROWTH
Special Requests: ADEQUATE
Special Requests: ADEQUATE

## 2022-02-09 LAB — C-REACTIVE PROTEIN: CRP: 8 mg/dL — ABNORMAL HIGH (ref <1.0)

## 2022-02-09 LAB — FIBRINOGEN: Fibrinogen: 532 mg/dL — ABNORMAL HIGH (ref 210–475)

## 2022-02-09 LAB — D-DIMER, QUANTITATIVE: D-Dimer, Quant: 2.53 ug/mL-FEU — ABNORMAL HIGH (ref 0.00–0.50)

## 2022-02-09 LAB — PREPARE RBC (CROSSMATCH)

## 2022-02-09 MED ORDER — HYDROCORTISONE (PERIANAL) 2.5 % EX CREA
TOPICAL_CREAM | Freq: Two times a day (BID) | CUTANEOUS | Status: DC
  Filled 2022-02-09 (×3): qty 28.35

## 2022-02-09 MED ORDER — FUROSEMIDE 40 MG PO TABS
40.0000 mg | ORAL_TABLET | Freq: Every day | ORAL | Status: DC
  Filled 2022-02-09: qty 1

## 2022-02-09 MED ORDER — SODIUM CHLORIDE 0.9% IV SOLUTION: Freq: Once | INTRAVENOUS | Status: AC

## 2022-02-09 NOTE — Assessment & Plan Note (Addendum)
CT abdomen unremarkable. Patient reports gradual improvement in pain. No significant hard mass or lesion identified on rectal exam.

## 2022-02-09 NOTE — Progress Notes (Signed)
Progress Note   Patient: [name] [id number]:[id number] DOB: 1956/07/04 DOA: 02/04/2022     Hospitalization day: 4 DOS: the patient was seen and examined on 02/09/2022   Brief hospital course: MCKINSEY KEAGLE is a 66 y.o. female with a history of endometrial cancer on chemotherapy, hypertension, hyperlipidemia, GERD, diabetes. Patient presented secondary to diarrhea and confusion. Workup in the ED revealed evidence of acute diverticulitis. She was started on empiric antibiotics. Diarrhea of uncertain etiology at this time. IV fluids initiated. Also incidentally found to have COVID-19 infection, on room air and without pulmonary infiltrates. Started on Paxlovid. CRP significantly elevated.  Assessment and Plan: * Acute diverticulitis- (present on admission) CT scan shows mild sigmoid colon diverticulitis. Started on IV Zosyn. Later on transition to oral Augmentin. Now we will switch to [facility] and Flagyl due to concern with ongoing diarrhea. Cultures so far negative. GI pathogen panel negative. C. difficile collection was attempted but failed. Treated with Questran. Stop IV fluid.  Will require outpatient GI follow-up for colonoscopy.  Anemia due to antineoplastic chemotherapy- (present on admission) Thrombocytopenia. Chemotherapy-induced pancytopenia. Oncology consulted appreciate assistance. Monitor for hemoglobin and platelet stability. Transfuse for hemoglobin less than 7.  S/P 1 PRBC transfusion on 2/25. Per oncology okay to resume DVT prophylaxis as long as platelets remain above 50.  COVID-19 virus infection- (present on admission) Incidental. Asymptomatic. Chest x-ray clear. Patient is immunocompromised secondary to known cancer on chemotherapy. Diagnosed on 2/20 and will need isolation for 21 days. CRP significantly elevated; unsure if related to COVID-19 vs underlying chronic illness -Paxlovid x5 days  Chronic kidney disease, stage 3a ([place])- (present on admission) Baseline  serum creatinine around 1.1. Currently serum creatinine normal. Remaining stable.  Holding lisinopril.  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 Alvy Bimler and is currently receiving chemotherapy. Last treatment on 01/22/22. Appreciate Dr. Marrian Salvage assistance. Patient reported 1 episode of vaginal bleeding on 2/22.  No further bleeding reported after that.  Uncontrolled type 2 diabetes mellitus with hyperglycemia, without long-term current use of insulin ([place])- (present on admission) Patient is on Trulicity, glimepiride and metformin as an outpatient. Last hemoglobin A1C of 7.9%. Uncontrolled. -Hold home regimen -SSI while inpatient  Rectal pain- (present on admission) CT abdomen unremarkable. Patient reports gradual improvement in pain. Monitor.  For now.  Right shoulder pain- (present on admission) Shoulder XR negative. Monitor.  Chronic diastolic heart failure ([place])- (present on admission) No evidence of exacerbation. History of normal LVEF with grade 1 diastolic dysfunction. Patient is on lasix as an outpatient -Hold Lasix while having diarrhea/increased fluid losses  Dyslipidemia- (present on admission) Patient is on Lipitor as an outpatient. -Hold Lipitor while on Paxlovid  Obesity (BMI 30-39.9)- (present on admission) Body mass index is 39.32 kg/m.  Placing the patient at high risk of poor outcome.  Hypertension- (present on admission) Patient is on Imdur, lisinopril and metoprolol as an outpatient. Blood pressure significantly uncontrolled. -Continue home metoprolol and Imdur.  Hold lisinopril.        Subjective: No nausea or vomiting no fever no chills.  Ongoing complaint of rectal pain.  Intermittently reports blood on her wipe.  Oral intake improving.  Physical Exam: Vitals:   02/09/22 1213 02/09/22 1235 02/09/22 1500 02/09/22 1955  BP: (!) 158/91 (!) 154/71 (!) 170/93 (!) 188/78  Pulse: 66 62 62 75   Resp: 17 18 16 18   Temp: (!) 97.2 F (36.2 C) (!) 97.3 F (36.3 C) 97.8 F (36.6 C) 98  F (36.7 C)  TempSrc: Oral Oral Oral Oral  SpO2: 100% 99% 97% 100%  Weight:      Height:       General: Appear in mild distress; no visible Abnormal Neck Mass Or lumps, Conjunctiva normal Cardiovascular: S1 and S2 Present, no Murmur, Respiratory: good respiratory effort, Bilateral Air entry present and CTA, no Crackles, no wheezes Abdomen: Bowel Sound present Extremities: no Pedal edema Neurology: alert and oriented to time, place, and person Gait not checked due to patient safety concerns   Data Reviewed:  I have Reviewed nursing notes, Vitals, and Lab results since pt's last encounter. Pertinent lab results CBC and BMP I have ordered test including CBC and BMP    Family Communication: Daughter at bedside  Disposition: Status is: Inpatient Remains inpatient appropriate because: Ongoing concern with diarrhea and abdominal pain as well as requiring blood transfusion Author: Berle Mull, MD 02/09/2022 8:39 PM  For on call review [email or web address].

## 2022-02-10 LAB — C-REACTIVE PROTEIN: CRP: 6.5 mg/dL — ABNORMAL HIGH (ref <1.0)

## 2022-02-10 LAB — CBC WITH DIFFERENTIAL/PLATELET
Abs Immature Granulocytes: 0.12 10*3/uL — ABNORMAL HIGH (ref 0.00–0.07)
Basophils Absolute: 0 10*3/uL (ref 0.0–0.1)
Basophils Relative: 0 %
Eosinophils Absolute: 0 10*3/uL (ref 0.0–0.5)
Eosinophils Relative: 0 %
HCT: 27 % — ABNORMAL LOW (ref 36.0–46.0)
Hemoglobin: 9 g/dL — ABNORMAL LOW (ref 12.0–15.0)
Immature Granulocytes: 3 %
Lymphocytes Relative: 40 %
Lymphs Abs: 1.5 10*3/uL (ref 0.7–4.0)
MCH: 28.6 pg (ref 26.0–34.0)
MCHC: 33.3 g/dL (ref 30.0–36.0)
MCV: 85.7 fL (ref 80.0–100.0)
Monocytes Absolute: 0.4 10*3/uL (ref 0.1–1.0)
Monocytes Relative: 10 %
Neutro Abs: 1.7 10*3/uL (ref 1.7–7.7)
Neutrophils Relative %: 47 %
Platelets: 62 10*3/uL — ABNORMAL LOW (ref 150–400)
RBC: 3.15 MIL/uL — ABNORMAL LOW (ref 3.87–5.11)
RDW: 18.2 % — ABNORMAL HIGH (ref 11.5–15.5)
WBC: 3.7 10*3/uL — ABNORMAL LOW (ref 4.0–10.5)
nRBC: 0.5 % — ABNORMAL HIGH (ref 0.0–0.2)

## 2022-02-10 LAB — GLUCOSE, CAPILLARY
Glucose-Capillary: 129 mg/dL — ABNORMAL HIGH (ref 70–99)
Glucose-Capillary: 151 mg/dL — ABNORMAL HIGH (ref 70–99)
Glucose-Capillary: 155 mg/dL — ABNORMAL HIGH (ref 70–99)
Glucose-Capillary: 138 mg/dL — ABNORMAL HIGH (ref 70–99)

## 2022-02-10 LAB — COMPREHENSIVE METABOLIC PANEL
ALT: 55 U/L — ABNORMAL HIGH (ref 0–44)
AST: 46 U/L — ABNORMAL HIGH (ref 15–41)
Albumin: 2.8 g/dL — ABNORMAL LOW (ref 3.5–5.0)
Alkaline Phosphatase: 329 U/L — ABNORMAL HIGH (ref 38–126)
Anion gap: 8 (ref 5–15)
BUN: 5 mg/dL — ABNORMAL LOW (ref 8–23)
CO2: 22 mmol/L (ref 22–32)
Calcium: 8.5 mg/dL — ABNORMAL LOW (ref 8.9–10.3)
Chloride: 105 mmol/L (ref 98–111)
Creatinine, Ser: 0.8 mg/dL (ref 0.44–1.00)
GFR, Estimated: >60 mL/min (ref >60)
Glucose, Bld: 157 mg/dL — ABNORMAL HIGH (ref 70–99)
Potassium: 3.6 mmol/L (ref 3.5–5.1)
Sodium: 135 mmol/L (ref 135–145)
Total Bilirubin: 0.5 mg/dL (ref 0.3–1.2)
Total Protein: 6.7 g/dL (ref 6.5–8.1)

## 2022-02-10 LAB — MAGNESIUM: Magnesium: 1.7 mg/dL (ref 1.7–2.4)

## 2022-02-10 LAB — LACTIC ACID, PLASMA: Lactic Acid, Venous: 0.6 mmol/L (ref 0.5–1.9)

## 2022-02-10 LAB — OCCULT BLOOD X 1 CARD TO LAB, STOOL: Fecal Occult Bld: POSITIVE — AB

## 2022-02-10 MED ORDER — LIDOCAINE HCL URETHRAL/MUCOSAL 2 % EX GEL
1.0000 "application " | Freq: Once | CUTANEOUS | Status: AC
  Administered 2022-02-10: 1
  Filled 2022-02-10: qty 5

## 2022-02-10 MED ORDER — FUROSEMIDE 20 MG PO TABS
20.0000 mg | ORAL_TABLET | Freq: Every day | ORAL | Status: DC
  Administered 2022-02-10 – 2022-02-13 (×4): 20 mg via ORAL
  Filled 2022-02-10 (×4): qty 1

## 2022-02-10 NOTE — Progress Notes (Signed)
Progress Note   Patient: [name] QVZ:[id number] DOB: 01-30-1956 DOA: 02/04/2022     Hospitalization day: 5 DOS: the patient was seen and examined on 02/10/2022   Brief hospital course: CLAIRA JETER is a 66 y.o. female with a history of endometrial cancer on chemotherapy, hypertension, hyperlipidemia, GERD, diabetes. Patient presented secondary to diarrhea and confusion. Workup in the ED revealed evidence of acute diverticulitis. She was started on empiric antibiotics. Diarrhea of uncertain etiology at this time. IV fluids initiated. Also incidentally found to have COVID-19 infection, on room air and without pulmonary infiltrates. Started on Paxlovid. CRP significantly elevated.  Assessment and Plan: * Acute diverticulitis- (present on admission) CT scan shows mild sigmoid colon diverticulitis. Started on IV Zosyn. Later on transition to oral Augmentin. Now we will switch to [facility] and Flagyl due to concern with ongoing diarrhea. Cultures so far negative. GI pathogen panel negative. C. difficile collection was attempted but failed. Treated with Questran. Will require outpatient GI follow-up for colonoscopy.  Anemia due to antineoplastic chemotherapy- (present on admission) Thrombocytopenia. Chemotherapy-induced pancytopenia. Oncology consulted appreciate assistance. Monitor for hemoglobin and platelet stability. Transfuse for hemoglobin less than 7.  S/P 1 PRBC transfusion on 2/25. Hemoglobin remained stable after transfusion.  Platelet count remained stable. Per oncology okay to resume DVT prophylaxis as long as platelets remain above 50.  COVID-19 virus infection- (present on admission) Incidental. Asymptomatic. Chest x-ray clear. Patient is immunocompromised secondary to known cancer on chemotherapy. Diagnosed on 2/20 and will need isolation for 21 days. CRP significantly elevated to 12.8 on admission.  Now trending down to 6.5. Completed Paxlovid x5 days  Chronic kidney  disease, stage 3a ([place])- (present on admission) Baseline serum creatinine around 1.1. Currently serum creatinine normal. Remaining stable.  Holding lisinopril.  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 Alvy Bimler and is currently receiving chemotherapy. Last treatment on 01/22/22. Appreciate Dr. Marrian Salvage assistance. Patient reported 1 episode of vaginal bleeding on 2/22.  No further bleeding reported after that.  Uncontrolled type 2 diabetes mellitus with hyperglycemia, without long-term current use of insulin ([place])- (present on admission) Patient is on Trulicity, glimepiride and metformin as an outpatient. Last hemoglobin A1C of 7.9%. Uncontrolled. -Hold home regimen -SSI while inpatient  Rectal pain- (present on admission) CT abdomen unremarkable. Patient reports gradual improvement in pain. Due to ongoing complaint after informed consent with the use of lidocaine jelly rectal examination performed on 2/26.  FOBT is positive although no visible blood on the sample.  No significant hard mass or lesion identified on rectal exam.  Right shoulder pain- (present on admission) Shoulder XR negative. Monitor.  Chronic diastolic heart failure ([place])- (present on admission) No evidence of exacerbation. History of normal LVEF with grade 1 diastolic dysfunction. Patient is on lasix as an outpatient -Hold Lasix while having diarrhea/increased fluid losses  Dyslipidemia- (present on admission) Patient is on Lipitor as an outpatient. -Hold Lipitor while on Paxlovid  Obesity (BMI 30-39.9)- (present on admission) Body mass index is 39.32 kg/m.  Placing the patient at high risk of poor outcome.  Hypertension- (present on admission) Patient is on Imdur, lisinopril and metoprolol as an outpatient. Blood pressure significantly uncontrolled. -Continue home metoprolol and Imdur.  Hold lisinopril.   Subjective: No nausea no vomiting.  Pain  improving.  Initially patient was not able to sit up at the edge of the bed due to the rectal pain now she is able to sit up and ambulate.  Pain still  rated as 8 out of 10 intermittent in nature.  Diarrhea frequency improving.  Physical Exam: Vitals:   02/09/22 2112 02/10/22 0616 02/10/22 1452 02/10/22 1456  BP: (!) 159/75 (!) 173/83 (!) 192/107 (!) 181/102  Pulse: 73 69 68 72  Resp:  18 20 20   Temp:  98.1 F (36.7 C) 98 F (36.7 C)   TempSrc:  Oral Oral   SpO2:  100% 100% 100%  Weight:      Height:       General: Appear in mild distress; no visible Abnormal Neck Mass Or lumps, Conjunctiva normal Cardiovascular: S1 and S2 Present, no Murmur, Respiratory: good respiratory effort, Bilateral Air entry present and CTA, no Crackles, no wheezes Abdomen: Bowel Sound present Extremities: no Pedal edema Neurology: alert and oriented to time, place, and person Gait not checked due to patient safety concerns  Rectal exam.  No visible blood.  Brown color stool.  There is a visible bulge.  No hard lump or lesion identified on exam.  Data Reviewed:  I have Reviewed nursing notes, Vitals, and Lab results since pt's last encounter. Pertinent lab results CBC and BMP I have ordered test including CBC and BMP    Family Communication: Discussed with daughter on the phone  Disposition: Status is: Inpatient Remains inpatient appropriate because: Ongoing concerns with poor p.o. intake.  Will await oncology recommendation before discharging as well as completion of the calorie count.  Author: Berle Mull, MD 02/10/2022 8:05 PM  For on call review [email or web address].

## 2022-02-10 NOTE — Plan of Care (Signed)

## 2022-02-11 DIAGNOSIS — R7989 Other specified abnormal findings of blood chemistry: Secondary | ICD-10-CM | POA: Diagnosis not present

## 2022-02-11 LAB — TYPE AND SCREEN
ABO/RH(D): O POS
Antibody Screen: NEGATIVE
Unit division: 0

## 2022-02-11 LAB — IRON AND TIBC
Iron: 74 ug/dL (ref 28–170)
Saturation Ratios: 36 % — ABNORMAL HIGH (ref 10.4–31.8)
TIBC: 206 ug/dL — ABNORMAL LOW (ref 250–450)
UIBC: 132 ug/dL

## 2022-02-11 LAB — CBC WITH DIFFERENTIAL/PLATELET
Abs Immature Granulocytes: 0.08 10*3/uL — ABNORMAL HIGH (ref 0.00–0.07)
Basophils Absolute: 0 10*3/uL (ref 0.0–0.1)
Basophils Relative: 0 %
Eosinophils Absolute: 0 10*3/uL (ref 0.0–0.5)
Eosinophils Relative: 0 %
HCT: 25.7 % — ABNORMAL LOW (ref 36.0–46.0)
Hemoglobin: 8.3 g/dL — ABNORMAL LOW (ref 12.0–15.0)
Immature Granulocytes: 2 %
Lymphocytes Relative: 36 %
Lymphs Abs: 1.4 10*3/uL (ref 0.7–4.0)
MCH: 27.9 pg (ref 26.0–34.0)
MCHC: 32.3 g/dL (ref 30.0–36.0)
MCV: 86.2 fL (ref 80.0–100.0)
Monocytes Absolute: 0.5 10*3/uL (ref 0.1–1.0)
Monocytes Relative: 12 %
Neutro Abs: 1.9 10*3/uL (ref 1.7–7.7)
Neutrophils Relative %: 50 %
Platelets: 59 10*3/uL — ABNORMAL LOW (ref 150–400)
RBC: 2.98 MIL/uL — ABNORMAL LOW (ref 3.87–5.11)
RDW: 17.6 % — ABNORMAL HIGH (ref 11.5–15.5)
WBC: 3.8 10*3/uL — ABNORMAL LOW (ref 4.0–10.5)
nRBC: 0.5 % — ABNORMAL HIGH (ref 0.0–0.2)

## 2022-02-11 LAB — COMPREHENSIVE METABOLIC PANEL WITH GFR
ALT: 74 U/L — ABNORMAL HIGH (ref 0–44)
AST: 89 U/L — ABNORMAL HIGH (ref 15–41)
Albumin: 2.6 g/dL — ABNORMAL LOW (ref 3.5–5.0)
Alkaline Phosphatase: 293 U/L — ABNORMAL HIGH (ref 38–126)
Anion gap: 5 (ref 5–15)
BUN: 7 mg/dL — ABNORMAL LOW (ref 8–23)
CO2: 24 mmol/L (ref 22–32)
Calcium: 8.4 mg/dL — ABNORMAL LOW (ref 8.9–10.3)
Chloride: 109 mmol/L (ref 98–111)
Creatinine, Ser: 0.71 mg/dL (ref 0.44–1.00)
GFR, Estimated: >60 mL/min (ref >60)
Glucose, Bld: 151 mg/dL — ABNORMAL HIGH (ref 70–99)
Potassium: 3.6 mmol/L (ref 3.5–5.1)
Sodium: 138 mmol/L (ref 135–145)
Total Bilirubin: 0.5 mg/dL (ref 0.3–1.2)
Total Protein: 6.3 g/dL — ABNORMAL LOW (ref 6.5–8.1)

## 2022-02-11 LAB — C-REACTIVE PROTEIN: CRP: 4.9 mg/dL — ABNORMAL HIGH (ref <1.0)

## 2022-02-11 LAB — D-DIMER, QUANTITATIVE: D-Dimer, Quant: 2.47 ug/mL-FEU — ABNORMAL HIGH (ref 0.00–0.50)

## 2022-02-11 LAB — GLUCOSE, CAPILLARY
Glucose-Capillary: 155 mg/dL — ABNORMAL HIGH (ref 70–99)
Glucose-Capillary: 222 mg/dL — ABNORMAL HIGH (ref 70–99)
Glucose-Capillary: 145 mg/dL — ABNORMAL HIGH (ref 70–99)
Glucose-Capillary: 119 mg/dL — ABNORMAL HIGH (ref 70–99)

## 2022-02-11 LAB — BPAM RBC
Blood Product Expiration Date: 202303272359
ISSUE DATE / TIME: 202302251206
Unit Type and Rh: 5100

## 2022-02-11 LAB — MAGNESIUM: Magnesium: 1.7 mg/dL (ref 1.7–2.4)

## 2022-02-11 LAB — FERRITIN: Ferritin: 575 ng/mL — ABNORMAL HIGH (ref 11–307)

## 2022-02-11 MED ORDER — BOOST PLUS PO LIQD
237.0000 mL | Freq: Three times a day (TID) | ORAL | Status: DC
Start: 2022-02-11 — End: 2022-02-12
  Administered 2022-02-11: 237 mL via ORAL
  Filled 2022-02-11 (×3): qty 237

## 2022-02-11 NOTE — Assessment & Plan Note (Signed)
Appreciate GI assistance. Monitor.  Likely in the setting of antibiotics.

## 2022-02-11 NOTE — Progress Notes (Signed)
Physical Therapy Treatment Patient Details Name: [name] MRN: [id number] DOB: [birth year] Today's Date: 02/11/2022   History of Present Illness 66 y.o. female with medical history significant of endometrial cancer on chemotherapy, hypertension, hyperlipidemia, GERD, diabetes. Patient presented secondary to being "out of it," having "loose bowels," chest pain, poor appetite. Dx of diverticulitis, incidental COVID finding.    PT Comments    General Comments: AxO x 3 very pleasant lady but feels "tired", "wore out".  "I don't want to do this". Daughter encouraged pt to work with Physical Therapy.  Assisted off bed to bathroom then amb in hallway. General bed mobility comments: pt self able and sitting EOB Indep with daughter in room. General transfer comment: supervision for safety due to recent fall.  Pt self toilet "I would like some privacy".  Good use of hands to steady self. General Gait Details: amb to and from bathroom ans in room without any AD as pt presented with occassional "furniture" support.  Assisted with amb in hallway with walker at Supervision level with <25% VC's on direction and safety with turns. Pt moves well but has MAX c/o feeling like she has "no energy" (CHEMO/COVID)  Pt plans to return home with family support.    Recommendations for follow up therapy are one component of a multi-disciplinary discharge planning process, led by the attending physician.  Recommendations may be updated based on patient status, additional functional criteria and insurance authorization.  Follow Up Recommendations  Home health PT     Assistance Recommended at Discharge    Patient can return home with the following A little help with bathing/dressing/bathroom;Help with stairs or ramp for entrance;Assistance with cooking/housework;Assist for transportation   Equipment Recommendations  Rollator (4 wheels)    Recommendations for Other Services       Precautions / Restrictions  Precautions Precautions: Fall Type of Shoulder Precautions: R shoulder pain (due to recent fall) Restrictions Other Position/Activity Restrictions: CHEMO fatigue     Mobility  Bed Mobility Overal bed mobility: Modified Independent             General bed mobility comments: pt self able and sitting EOB Indep with daughter in room.    Transfers Overall transfer level: Needs assistance Equipment used: Rolling walker (2 wheels), None Transfers: Sit to/from Stand Sit to Stand: Supervision           General transfer comment: supervision for safety due to recent fall.  Pt self toilet "I would like some privacy".  Good use of hands to steady self.    Ambulation/Gait Ambulation/Gait assistance: Supervision Gait Distance (Feet): 65 Feet Assistive device: Rolling walker (2 wheels), IV Pole, None Gait Pattern/deviations: Step-through pattern, Decreased stride length Gait velocity: decr     General Gait Details: amb to and from bathroom ans in room without any AD as pt presented with occassional "furniture" support.  Assisted with amb in hallway with walker at Supervision level with <25% VC's on direction and safety with turns.   Stairs             Wheelchair Mobility    Modified Rankin (Stroke Patients Only)       Balance                                            Cognition Arousal/Alertness: Awake/alert Behavior During Therapy: WFL for tasks assessed/performed Overall Cognitive Status: Within  Functional Limits for tasks assessed                                 General Comments: AxO x 3 very pleasant lady but feels "tired", "wore out".  "I don't want to do this". Daughter encouraged pt to wprk with Physical Therapy.        Exercises      General Comments        Pertinent Vitals/Pain Pain Assessment Pain Assessment: Faces Faces Pain Scale: Hurts a little bit Pain Location: R shoulder Pain Descriptors / Indicators:  Grimacing    Home Living                          Prior Function            PT Goals (current goals can now be found in the care plan section) Progress towards PT goals: Progressing toward goals    Frequency    Min 3X/week      PT Plan Current plan remains appropriate    Co-evaluation              AM-PAC PT "6 Clicks" Mobility   Outcome Meas Help needed turning from your back to your side while in a flat bed without using bedrails?: None Help needed moving from lying on your back to sitting on the side of a flat bed without using bedrails?: None Help needed moving to and from a bed to a chair (including a wheelchair)?: None Help needed standing up from a chair using your arms (e.g., wheelchair or bedside chair)?: None Help needed to walk in hospital room?: A Little Help needed climbing 3-5 steps with a railing? : A Little 6 Click Score: 22    End of Session Equipment Utilized During Treatment: Gait belt Activity Tolerance: Patient tolerated treatment well Patient left: in chair;with call bell/phone within reach;with family/visitor present Nurse Communication: Mobility status PT Visit Diagnosis: Difficulty in walking, not elsewhere classified (R26.2);Pain Pain - Right/Left: Right Pain - part of body: Shoulder     Time: 1047-1105 PT Time Calculation (min) (ACUTE ONLY): 18 min  Charges:  $Gait Training: 8-22 mins                    Rica Koyanagi  PTA Acute  Rehabilitation Services Pager      [phone] Office      [phone]

## 2022-02-11 NOTE — Consult Note (Signed)
Consultation  Referring Provider: Dr. Posey Pronto   Primary Care Physician:  [name], MD Primary Gastroenterologist: Dr. Havery Moros Reason for Consultation: Diarrhea, Rectal Pain, +hemoccult, Increased LFT's             HPI:   [name] is a 66 y.o. female with a past medical history significant for endometrial cancer on chemotherapy, hypertension, hyperlipidemia, GERD and diabetes, who presented to the hospital on 02/04/2022 with diarrhea.    At time of admission patient presents secondary to being "out of it and having loose bowels", chest pain and a poor appetite.  Apparently describes symptoms that started 2 weeks prior after receiving her last chemo treatment.  Apparently her oncologist to start her on IV fluid infusions which helped a little bit.  Patient diagnosed with acute diverticulitis in the sigmoid colon and given Zosyn in the ID.  Also diagnosed incidentally with a COVID-19 virus infection and given Paxlovid x5 days.    Today, the patient is seen with her daughter on the phone.  She explains that she started with incontinent loose stools and had increased rectal pain which she describes as a constant discomfort initially rated as a 10/10 which brought her into the hospital.  Since being here over the past 7 days the rectal pain is now 7-8/10 and her stools are getting slightly better may be 4-5 a day that at least let her know they are coming, but are still occurring throughout the night and sometimes wake her at 3 in the morning.  Denies any abdominal pain.  Does describe some bright red blood on the toilet paper after wiping.    Daughter addressed this concern that patient's parents were both diagnosed with colon cancer at one point during their lives and the patient has never had a colonoscopy.    Denies fever, chills, weight loss, nausea or vomiting.  ED course: 02/04/2022 CT abdomen pelvis with contrast with acute uncomplicated sigmoid diverticulitis and prior  hysterectomy  Hospital course: Patient initially treated for diverticulitis with Zosyn and then later transitioned to oral Augmentin and switch to [facility] and Flagyl due to concern with ongoing diarrhea, GI path panel negative, C. difficile never collected, started on Questran, continues with rectal pain-rectal exam 2/26 by hospitalist with FOBT positive stool and no significant hard mass or lesion; LFTs increasing-02/08/2022 AST 52, ALT 50, total bili 0.5, alk phos 337--> 02/11/2022 AST 89, ALT 74, alk phos 293; hemoglobin 8.3 (9.0 on 2/26)  NO GI History  Past Medical History:  Diagnosis Date   Anginal pain ([place]) 2016   Cancer ([place])    Coronary atherosclerosis of native coronary artery, with stent to LCX in 2006 06/04/2013   Diabetes mellitus without complication (HCC)    GERD (gastroesophageal reflux disease)    Heart attack ([place]) 2006   History of radiation therapy 08/16/2021   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  Dr Gery Pray   Hyperlipidemia LDL goal < 70 06/07/2013   Hypertension    Obesity    S/P coronary artery stent placement to RCA with PROMUS DES, 06/07/13, residual disease on OM1, OM2 and rPDA 06/07/2013    Past Surgical History:  Procedure Laterality Date   CARDIAC CATHETERIZATION N/A 08/22/2015   Procedure: Left Heart Cath and Coronary Angiography;  Surgeon: Leonie Man, MD;  Location: [place] CV LAB;  Service: Cardiovascular;  Laterality: N/A;   CARDIAC CATHETERIZATION N/A 08/22/2015   Procedure: Coronary Stent Intervention;  Surgeon: Leonie Man, MD;  Location: [facility]  INVASIVE CV LAB;  Service: Cardiovascular;  Laterality: N/A;   CARDIAC CATHETERIZATION N/A 08/22/2015   Procedure: Left Heart Cath and Coronary Angiography;  Surgeon: Leonie Man, MD;  Location: [place] CV LAB;  Service: Cardiovascular;  Laterality: N/A;   CORONARY STENT PLACEMENT  2006   TAXUS stent CFX in [facility]   IR IMAGING GUIDED PORT INSERTION  02/20/2021   LEFT HEART CATH AND CORONARY  ANGIOGRAPHY N/A 07/21/2018   Procedure: LEFT HEART CATH AND CORONARY ANGIOGRAPHY;  Surgeon: Troy Sine, MD;  Location: [place] CV LAB;  Service: Cardiovascular;  Laterality: N/A;   LEFT HEART CATHETERIZATION WITH CORONARY ANGIOGRAM N/A 06/07/2013   Procedure: LEFT HEART CATHETERIZATION WITH CORONARY ANGIOGRAM;  Surgeon: Sanda Klein, MD;  Location: [place] CATH LAB;  Service: Cardiovascular;  Laterality: N/A;   LYMPH NODE DISSECTION N/A 01/29/2021   Procedure: SELECTIVE LYMPH NODE DISSECTION;  Surgeon: Lafonda Mosses, MD;  Location: [facility];  Service: Gynecology;  Laterality: N/A;   ROBOTIC ASSISTED TOTAL HYSTERECTOMY WITH BILATERAL SALPINGO OOPHERECTOMY Bilateral 01/29/2021   Procedure: XI ROBOTIC ASSISTED TOTAL HYSTERECTOMY WITH BILATERAL SALPINGO OOPHORECTOMY,;  Surgeon: Lafonda Mosses, MD;  Location: [facility];  Service: Gynecology;  Laterality: Bilateral;   SENTINEL NODE BIOPSY N/A 01/29/2021   Procedure: SENTINEL NODE BIOPSY;  Surgeon: Lafonda Mosses, MD;  Location: [facility];  Service: Gynecology;  Laterality: N/A;    Family History  Problem Relation Age of Onset   Diabetes Mother    Hypertension Mother    Diabetes Sister    Diabetes Brother    Hypertension Brother    Colon cancer Father    Breast cancer Neg Hx    Ovarian cancer Neg Hx    Endometrial cancer Neg Hx    Prostate cancer Neg Hx    Pancreatic cancer Neg Hx      Social History   Tobacco Use   Smoking status: Former    Types: Cigars    Quit date: 06/23/2004    Years since quitting: 17.6   Smokeless tobacco: Never   Tobacco comments:    "a few black and milds a day" CigrettesX 30 years  Vaping Use   Vaping Use: Never used  Substance Use Topics   Alcohol use: Yes    Comment: occ   Drug use: No    Prior to Admission medications   Medication Sig Start Date End Date Taking? Authorizing Provider  aspirin 81 MG EC tablet Take 1 tablet (81 mg total) by mouth daily. 07/13/21  Yes Lavina Hamman, MD   atorvastatin (LIPITOR) 80 MG tablet TAKE 1 TABLET (80 MG TOTAL) BY MOUTH AT BEDTIME. 06/20/21 06/20/22 Yes Mayers, Cari S, PA-C  calcium carbonate (TUMS - DOSED IN MG ELEMENTAL CALCIUM) 500 MG chewable tablet Chew 1 tablet by mouth daily.   Yes [provider]  dexamethasone (DECADRON) 4 MG tablet Take 2 tablets by mouth the night before and 2 tablets the morning of chemotherapy, every 3 weeks x 6 cycles 12/24/21  Yes Gorsuch, Ni, MD  docusate sodium (COLACE) 100 MG capsule Take 100 mg by mouth daily as needed for mild constipation.   Yes [provider]  Dulaglutide (TRULICITY) 1.5 LD/3.5TS SOPN Inject 1.5 mg into the skin once a week. Patient taking differently: Inject 1.5 mg into the skin once a week. Friday/Saturday 06/20/21  Yes Mayers, Cari S, PA-C  furosemide (LASIX) 20 MG tablet Take 2 tablets (40 mg total) by mouth daily. 01/14/22 02/14/22 Yes Newlin, Enobong,  MD  glimepiride (AMARYL) 4 MG tablet Take 1 tablet (4 mg total) by mouth daily with breakfast. 09/20/21 09/20/22 Yes [name], MD  isosorbide mononitrate (IMDUR) 30 MG 24 hr tablet Take 2 tablets (60 mg total) by mouth daily. 01/14/22 02/14/22 Yes Newlin, [name], MD  lisinopril (ZESTRIL) 20 MG tablet TAKE 1 TABLET BY MOUTH ONCE A DAY 01/14/22 01/14/23 Yes [name], MD  liver oil-zinc oxide (DESITIN) 40 % ointment Apply 1 application topically as needed for irritation.   Yes [provider]  metFORMIN (GLUCOPHAGE) 500 MG tablet Take 2 tablets (1,000 mg total) by mouth 2 (two) times daily with a meal. 01/14/22 02/14/22 Yes Newlin, Enobong, MD  metoprolol tartrate (LOPRESSOR) 100 MG tablet TAKE 1 TABLET (100 MG TOTAL) BY MOUTH 2 (TWO) TIMES DAILY. 01/14/22 01/14/23 Yes [name], MD  morphine (MSIR) 15 MG tablet Take 1 tablet (15 mg total) by mouth every 4 (four) hours as needed for severe pain. 02/01/22  Yes Gorsuch, Ni, MD  nitroGLYCERIN (NITROSTAT) 0.4 MG SL tablet Place 1 tablet (0.4 mg total) under the tongue  every 5 (five) minutes as needed for chest pain. 08/24/15  Yes Kilroy, Luke K, PA-C  ondansetron (ZOFRAN) 8 MG tablet Take 1 tablet by mouth 2  times daily as needed for refractory nausea / vomiting. Start on day 3 after carboplatin chemo. 12/24/21  Yes Gorsuch, Ni, MD  pantoprazole (PROTONIX) 40 MG tablet Take 1 tablet (40 mg total) by mouth daily. 01/24/22 02/23/22 Yes Gorsuch, Ni, MD  prochlorperazine (COMPAZINE) 10 MG tablet Take 1 tablet by mouth every 6  hours as needed (Nausea or vomiting). 12/24/21  Yes Heath Lark, MD    Current Facility-Administered Medications  Medication Dose Route Frequency Provider Last Rate Last Admin   acetaminophen (TYLENOL) tablet 650 mg  650 mg Oral Q6H PRN Mariel Aloe, MD   650 mg at 02/10/22 1707   Or   acetaminophen (TYLENOL) suppository 650 mg  650 mg Rectal Q6H PRN Mariel Aloe, MD       amLODipine (NORVASC) tablet 5 mg  5 mg Oral Daily Lavina Hamman, MD   5 mg at 02/11/22 1123   ascorbic acid (VITAMIN C) tablet 500 mg  500 mg Oral Daily Mariel Aloe, MD   500 mg at 02/11/22 1122   calcium carbonate (TUMS - dosed in mg elemental calcium) chewable tablet 200 mg of elemental calcium  1 tablet Oral Daily Mariel Aloe, MD   200 mg of elemental calcium at 02/11/22 1122   cefdinir (OMNICEF) capsule 300 mg  300 mg Oral Q12H Lavina Hamman, MD   300 mg at 02/11/22 7824   Chlorhexidine Gluconate Cloth 2 % PADS 6 each  6 each Topical Daily Lavina Hamman, MD   6 each at 02/11/22 1126   cholestyramine light (PREVALITE) packet 4 g  4 g Oral Q1200 Lavina Hamman, MD   4 g at 02/11/22 1305   dibucaine (NUPERCAINAL) 1 % ointment   Topical PRN Lavina Hamman, MD   Given at 02/08/22 0852   enoxaparin (LOVENOX) injection 40 mg  40 mg Subcutaneous Daily Lavina Hamman, MD   40 mg at 02/11/22 1127   furosemide (LASIX) tablet 20 mg  20 mg Oral Daily Lavina Hamman, MD   20 mg at 02/11/22 1122   hydrALAZINE (APRESOLINE) tablet 25 mg  25 mg Oral Q6H PRN Mariel Aloe, MD   25 mg at 02/11/22 [phone]  hydrocortisone (ANUSOL-HC) 2.5 % rectal cream   Rectal BID Lavina Hamman, MD   Given at 02/11/22 1125   insulin aspart (novoLOG) injection 0-15 Units  0-15 Units Subcutaneous TID WC Mariel Aloe, MD   5 Units at 02/11/22 1306   isosorbide mononitrate (IMDUR) 24 hr tablet 60 mg  60 mg Oral Daily Mariel Aloe, MD   60 mg at 02/11/22 1122   lactose free nutrition (BOOST PLUS) liquid 237 mL  237 mL Oral TID WC Lavina Hamman, MD       liver oil-zinc oxide (DESITIN) 40 % ointment   Topical BID Mariel Aloe, MD   Given at 02/11/22 1125   metoprolol tartrate (LOPRESSOR) tablet 100 mg  100 mg Oral BID Mariel Aloe, MD   100 mg at 02/11/22 1122   metroNIDAZOLE (FLAGYL) tablet 500 mg  500 mg Oral Q12H Lavina Hamman, MD   500 mg at 02/11/22 1122   morphine (MSIR) tablet 15 mg  15 mg Oral Q4H PRN Mariel Aloe, MD   15 mg at 02/09/22 2117   multivitamin with minerals tablet 1 tablet  1 tablet Oral Daily Lavina Hamman, MD   1 tablet at 02/11/22 1122   ondansetron (ZOFRAN) tablet 4 mg  4 mg Oral Q6H PRN Mariel Aloe, MD       Or   ondansetron [facility]) injection 4 mg  4 mg Intravenous Q6H PRN Mariel Aloe, MD       pantoprazole (PROTONIX) EC tablet 40 mg  40 mg Oral Daily Mariel Aloe, MD   40 mg at 02/11/22 1122   saccharomyces boulardii (FLORASTOR) capsule 250 mg  250 mg Oral BID Kathryne Eriksson, NP   250 mg at 02/11/22 1122   sodium chloride flush (NS) 0.9 % injection 10-40 mL  10-40 mL Intracatheter Q12H Lavina Hamman, MD   10 mL at 02/11/22 1142   sodium chloride flush (NS) 0.9 % injection 10-40 mL  10-40 mL Intracatheter PRN Lavina Hamman, MD        Allergies as of 02/04/2022   (No Known Allergies)     Review of Systems:    Constitutional: No weight loss, fever or chills Skin: No rash  Cardiovascular: No chest pain  Respiratory: No SOB Gastrointestinal: See HPI and otherwise negative Genitourinary: No dysuria  Neurological: No  headache, dizziness or syncope Musculoskeletal: No new muscle or joint pain Hematologic: No bruising Psychiatric: No history of depression or anxiety    Physical Exam:  Vital signs in last 24 hours: Temp:  [97.8 F (36.6 C)-98 F (36.7 C)] 97.8 F (36.6 C) (02/27 0639) Pulse Rate:  [68-74] 72 (02/27 0639) Resp:  [18-20] 18 (02/27 0639) BP: (134-192)/(76-107) 185/81 (02/27 0639) SpO2:  [96 %-100 %] 98 % (02/27 0639) Last BM Date : 02/09/22 General:   Pleasant obese AA female appears to be in NAD, Well developed, Well nourished, alert and cooperative Head:  Normocephalic and atraumatic. Eyes:   PEERL, EOMI. No icterus. Conjunctiva pink. Ears:  Normal auditory acuity. Neck:  Supple Throat: Oral cavity and pharynx without inflammation, swelling or lesion. Teeth in good condition. Lungs: Respirations even and unlabored. Lungs clear to auscultation bilaterally.   No wheezes, crackles, or rhonchi.  Heart: Normal S1, S2. No MRG. Regular rate and rhythm. No peripheral edema, cyanosis or pallor.  Abdomen:  Soft, nondistended, nontender. No rebound or guarding. Normal bowel sounds. No appreciable masses or hepatomegaly. Rectal:  External: no abnormality; Internal: pain to posterior of rectum with light palpation (unable to visualize if fissure) Msk:  Symmetrical without gross deformities. Peripheral pulses intact.  Extremities:  Without edema, no deformity or joint abnormality. Neurologic:  Alert and  oriented x4;  grossly normal neurologically.  Skin:   Dry and intact without significant lesions or rashes. Psychiatric: Demonstrates good judgement and reason without abnormal affect or behaviors.  LAB RESULTS: Recent Labs    02/09/22 1756 02/10/22 0818 02/11/22 0319  WBC 4.4 3.7* 3.8*  HGB 8.2* 9.0* 8.3*  HCT 25.4* 27.0* 25.7*  PLT 63* 62* 59*   BMET Recent Labs    02/09/22 0355 02/10/22 0818 02/11/22 0319  NA 136 135 138  K 3.5 3.6 3.6  CL 107 105 109  CO2 '22 22 24  ' GLUCOSE  110* 157* 151*  BUN 6* 5* 7*  CREATININE 0.90 0.80 0.71  CALCIUM 8.2* 8.5* 8.4*   LFT Recent Labs    02/11/22 0319  PROT 6.3*  ALBUMIN 2.6*  AST 89*  ALT 74*  ALKPHOS 293*  BILITOT 0.5   PT/INR Recent Labs    02/09/22 0806  LABPROT 15.0  INR 1.2     Impression / Plan:  Impression: 1.  Rectal pain: Describes since time of admission with increased bowel movements/diarrhea, pain posteriorly on exam but could not appreciate fissure, does feel some relief with Anusol and lidocaine 2.  Anemia: With Hemoccult positive testing and some bright red blood after wiping per patient, she is pancytopenic and this likely represents result from recent chemotherapy 3.  History of endometrial cancer: On chemo 4.  Acute diverticulitis: Treated since admission on 4 different antibiotics 5.  Diarrhea: Seems to be decreasing since admission 6.  CKD stage III 7.  Chronic diastolic heart failure 8.  Obesity 9.  Diabetes 10.  Family history colon cancer: In patient's mother and father, uncertain ages 62.  Elevated LFTS: Increased just starting during this admission; consider DILI most likely but will also consider autoimmune and viral hepatitis because 12.  COVID-positive: Status posttreatment  Plan: 1.  At the moment given recent diverticulitis there are no plans for colonoscopy during this hospitalization.  Would need to wait at least another 5 to 7 weeks from now unless urgent cause arises. 2.  Will add on iron studies, ANA, ASMA, alpha-1 antitrypsin and AMA as well as viral hepatitis panel-but suspect elevated LFTs are from medications given during this hospitalization 3.  Agree with rectal numbing cream as well as Anusol for now, could not appreciate a rectal fissure but this could also be the cause of rectal pain, glad it is at least decreasing some since admission 4.  Continue to trend LFTs 5.  Patient is pancytopenic meaning that the anemia is likely coming from chemotherapy 6.  Will need  to update the patient's daughter tomorrow once we have the exact plan as she is very involved in her mother's care  Thank you for your kind consultation, we will continue to follow.  Lavone Nian [facility]  02/11/2022, 1:55 PM

## 2022-02-11 NOTE — Progress Notes (Signed)
Progress Note   Patient: [name] GEX:[id number] DOB: [birth year] DOA: 02/04/2022     Hospitalization day: 6 DOS: the patient was seen and examined on 02/11/2022   Brief hospital course: [name] is a 66 y.o. female with a history of endometrial cancer on chemotherapy, hypertension, hyperlipidemia, GERD, diabetes. Patient presented secondary to diarrhea and confusion. Workup in the ED revealed evidence of acute diverticulitis. She was started on empiric antibiotics. Diarrhea of uncertain etiology at this time. IV fluids initiated. Also incidentally found to have COVID-19 infection, on room air and without pulmonary infiltrates. Started on Paxlovid. CRP significantly elevated. . Assessment and Plan: * Acute diverticulitis- (present on admission) CT scan shows mild sigmoid colon diverticulitis. Started on IV Zosyn. Later on transition to oral Augmentin.  Now on Omnicef Flagyl.  Last day of antibiotic 3/1. Cultures so far negative. GI pathogen panel negative. C. difficile collection was attempted but failed. Treated with Questran.  GI consulted.  Outpatient colonoscopy recommended.  LFT elevation Appreciate GI assistance. Monitor.  Likely in the setting of antibiotics.  Rectal pain- (present on admission) CT abdomen unremarkable. Patient reports gradual improvement in pain. Due to ongoing complaint after informed consent with the use of lidocaine jelly rectal examination performed on 2/26.  FOBT is positive although no visible blood on the sample.  No significant hard mass or lesion identified on rectal exam.  Anemia due to antineoplastic chemotherapy- (present on admission) Thrombocytopenia. Chemotherapy-induced pancytopenia. Oncology consulted appreciate assistance. Monitor for hemoglobin and platelet stability. Transfuse for hemoglobin less than 7.  S/P 1 PRBC transfusion on 2/25. Hemoglobin remained stable after transfusion. Platelet count remained stable. Per oncology  okay to resume DVT prophylaxis as long as platelets remain above 50.  COVID-19 virus infection- (present on admission) Incidental. Asymptomatic. Chest x-ray clear. Patient is immunocompromised secondary to known cancer on chemotherapy. Diagnosed on 2/20 and will need isolation for 21 days. CRP significantly elevated to 12.8 on admission.  Now trending down to 6.5. Completed Paxlovid x5 days  Chronic kidney disease, stage 3a ([place])- (present on admission) Baseline serum creatinine around 1.1. Currently serum creatinine normal. Remaining stable.  Holding lisinopril.  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 Alvy Bimler and is currently receiving chemotherapy. Last treatment on 01/22/22. Appreciate Dr. Alvy Bimler assistance. Patient reported 1 episode of vaginal bleeding on 2/22.  No further bleeding reported after that.  Uncontrolled type 2 diabetes mellitus with hyperglycemia, without long-term current use of insulin ([place])- (present on admission) Patient is on Trulicity, glimepiride and metformin as an outpatient. Last hemoglobin A1C of 7.9%. Uncontrolled. -Hold home regimen -SSI while inpatient  Right shoulder pain- (present on admission) Shoulder XR negative. Monitor.  Chronic diastolic heart failure ([place])- (present on admission) No evidence of exacerbation. History of normal LVEF with grade 1 diastolic dysfunction. Patient is on lasix as an outpatient -Hold Lasix while having diarrhea/increased fluid losses  Dyslipidemia- (present on admission) Patient is on Lipitor as an outpatient. -Hold Lipitor while on Paxlovid  Obesity (BMI 30-39.9)- (present on admission) Body mass index is 39.32 kg/m.  Placing the patient at high risk of poor outcome.  Hypertension- (present on admission) Patient is on Imdur, lisinopril and metoprolol as an outpatient. Blood pressure significantly uncontrolled. -Continue home metoprolol and Imdur.   Hold lisinopril.  Subjective:.  No nausea no vomiting no fever no chills.  Pain about the same in the rectal area.  No blood in the stool.  Oral intake improving.  Patient  not taking protein shake as she is worried that it is causing diarrhea.  Physical Exam: Vitals:   02/10/22 1456 02/10/22 2010 02/11/22 0639 02/11/22 1452  BP: (!) 181/102 134/76 (!) 185/81 (!) 169/82  Pulse: 72 74 72 68  Resp: 20 18 18 18   Temp:  97.9 F (36.6 C) 97.8 F (36.6 C) 98.2 F (36.8 C)  TempSrc:  Oral Oral Oral  SpO2: 100% 96% 98% 98%  Weight:      Height:       General: Appear in mild distress; no visible Abnormal Neck Mass Or lumps, Conjunctiva normal Cardiovascular: S1 and S2 Present, no Murmur, Respiratory: good respiratory effort, Bilateral Air entry present and CTA, no Crackles, no wheezes Abdomen: Bowel Sound present Extremities: no Pedal edema Neurology: alert and oriented to time, place, and person Gait not checked due to patient safety concerns   Data Reviewed:  I have Reviewed nursing notes, Vitals, and Lab results since pt's last encounter. Pertinent lab results CBC and CMP I have ordered test including CBC and CMP I have discussed pt's care plan and test results with GI.   Family Communication: Daughter at bedside  Disposition: Status is: Inpatient Remains inpatient appropriate because: Worsening LFT requiring further work-up.  Oncology still worried about patient's discharge with regards to symptom control concern and ongoing concern with poor p.o. intake.  Author: Berle Mull, MD 02/11/2022 7:12 PM  For on call review [email or web address].

## 2022-02-12 ENCOUNTER — Inpatient Hospital Stay: Payer: Medicaid Other | Admitting: Hematology and Oncology

## 2022-02-12 ENCOUNTER — Inpatient Hospital Stay: Payer: Medicaid Other

## 2022-02-12 LAB — C DIFFICILE (CDIFF) QUICK SCRN (NO PCR REFLEX)
C Diff antigen: NEGATIVE
C Diff interpretation: NOT DETECTED
C Diff toxin: NEGATIVE

## 2022-02-12 LAB — GLUCOSE, CAPILLARY
Glucose-Capillary: 164 mg/dL — ABNORMAL HIGH (ref 70–99)
Glucose-Capillary: 180 mg/dL — ABNORMAL HIGH (ref 70–99)
Glucose-Capillary: 217 mg/dL — ABNORMAL HIGH (ref 70–99)
Glucose-Capillary: 159 mg/dL — ABNORMAL HIGH (ref 70–99)

## 2022-02-12 LAB — CBC WITH DIFFERENTIAL/PLATELET
Abs Immature Granulocytes: 0.05 10*3/uL (ref 0.00–0.07)
Basophils Absolute: 0 10*3/uL (ref 0.0–0.1)
Basophils Relative: 0 %
Eosinophils Absolute: 0 10*3/uL (ref 0.0–0.5)
Eosinophils Relative: 0 %
HCT: 25.5 % — ABNORMAL LOW (ref 36.0–46.0)
Hemoglobin: 8.3 g/dL — ABNORMAL LOW (ref 12.0–15.0)
Immature Granulocytes: 1 %
Lymphocytes Relative: 37 %
Lymphs Abs: 1.5 10*3/uL (ref 0.7–4.0)
MCH: 28.1 pg (ref 26.0–34.0)
MCHC: 32.5 g/dL (ref 30.0–36.0)
MCV: 86.4 fL (ref 80.0–100.0)
Monocytes Absolute: 0.4 10*3/uL (ref 0.1–1.0)
Monocytes Relative: 10 %
Neutro Abs: 2.2 10*3/uL (ref 1.7–7.7)
Neutrophils Relative %: 52 %
Platelets: 61 10*3/uL — ABNORMAL LOW (ref 150–400)
RBC: 2.95 MIL/uL — ABNORMAL LOW (ref 3.87–5.11)
RDW: 18.1 % — ABNORMAL HIGH (ref 11.5–15.5)
WBC: 4.2 10*3/uL (ref 4.0–10.5)
nRBC: 0.5 % — ABNORMAL HIGH (ref 0.0–0.2)

## 2022-02-12 LAB — COMPREHENSIVE METABOLIC PANEL
ALT: 98 U/L — ABNORMAL HIGH (ref 0–44)
AST: 114 U/L — ABNORMAL HIGH (ref 15–41)
Albumin: 2.7 g/dL — ABNORMAL LOW (ref 3.5–5.0)
Alkaline Phosphatase: 355 U/L — ABNORMAL HIGH (ref 38–126)
Anion gap: 7 (ref 5–15)
BUN: 7 mg/dL — ABNORMAL LOW (ref 8–23)
CO2: 23 mmol/L (ref 22–32)
Calcium: 8.3 mg/dL — ABNORMAL LOW (ref 8.9–10.3)
Chloride: 107 mmol/L (ref 98–111)
Creatinine, Ser: 0.98 mg/dL (ref 0.44–1.00)
GFR, Estimated: >60 mL/min (ref >60)
Glucose, Bld: 148 mg/dL — ABNORMAL HIGH (ref 70–99)
Potassium: 3.4 mmol/L — ABNORMAL LOW (ref 3.5–5.1)
Sodium: 137 mmol/L (ref 135–145)
Total Bilirubin: 0.6 mg/dL (ref 0.3–1.2)
Total Protein: 6.4 g/dL — ABNORMAL LOW (ref 6.5–8.1)

## 2022-02-12 LAB — MAGNESIUM: Magnesium: 1.8 mg/dL (ref 1.7–2.4)

## 2022-02-12 LAB — ANA W/REFLEX IF POSITIVE: Anti Nuclear Antibody (ANA): NEGATIVE

## 2022-02-12 LAB — ANTI-SMOOTH MUSCLE ANTIBODY, IGG: F-Actin IgG: 5 Units (ref 0–19)

## 2022-02-12 LAB — ALPHA-1-ANTITRYPSIN: A-1 Antitrypsin, Ser: 225 mg/dL — ABNORMAL HIGH (ref 101–187)

## 2022-02-12 LAB — MITOCHONDRIAL ANTIBODIES: Mitochondrial M2 Ab, IgG: <20 Units (ref 0.0–20.0)

## 2022-02-12 MED ORDER — LISINOPRIL 20 MG PO TABS
20.0000 mg | ORAL_TABLET | Freq: Every day | ORAL | Status: DC
  Administered 2022-02-12 – 2022-02-18 (×7): 20 mg via ORAL
  Filled 2022-02-12 (×7): qty 1

## 2022-02-12 MED ORDER — LOPERAMIDE HCL 2 MG PO CAPS: 2.0000 mg | ORAL_CAPSULE | ORAL | Status: DC | PRN

## 2022-02-12 MED ORDER — ASPIRIN EC 81 MG PO TBEC
81.0000 mg | DELAYED_RELEASE_TABLET | Freq: Every day | ORAL | Status: DC
  Administered 2022-02-13 – 2022-02-15 (×3): 81 mg via ORAL
  Filled 2022-02-12 (×3): qty 1

## 2022-02-12 MED ORDER — LOPERAMIDE HCL 2 MG PO CAPS
2.0000 mg | ORAL_CAPSULE | Freq: Every day | ORAL | Status: DC
  Administered 2022-02-12 – 2022-02-13 (×2): 2 mg via ORAL
  Filled 2022-02-12 (×2): qty 1

## 2022-02-12 MED ORDER — BOOST / RESOURCE BREEZE PO LIQD CUSTOM
1.0000 | Freq: Three times a day (TID) | ORAL | Status: DC
  Administered 2022-02-12 – 2022-02-17 (×6): 1 via ORAL

## 2022-02-12 NOTE — Progress Notes (Signed)
° °   Progress Note   Subjective  Chief Complaint: Diarrhea, Rectal pain, Hemoccult positive, Increased LFTs  Continues with diarrhea this morning 3 bowel movements just after waking, mostly loose stool.  Does tell me the rectal pain is improved.  Aware of plans to wait on colonoscopy given recent diverticulitis.  Denies any new complaints or concerns.  She has not tried to eat yet today.   Objective   Vital signs in last 24 hours: Temp:  [97.8 F (36.6 C)-98.4 F (36.9 C)] 97.8 F (36.6 C) (02/28 0604) Pulse Rate:  [68-83] 78 (02/28 0604) Resp:  [17-18] 17 (02/28 0604) BP: (164-198)/(82-98) 164/98 (02/28 0604) SpO2:  [98 %-100 %] 100 % (02/28 0604) Last BM Date : 02/11/22 General:    overweight AA female in NAD Heart:  Regular rate and rhythm; no murmurs Lungs: Respirations even and unlabored, lungs CTA bilaterally Abdomen:  Soft, nontender and nondistended. Normal bowel sounds. Psych:  Cooperative. Normal mood and affect.  Intake/Output from previous day: 02/27 0701 - 02/28 0700 In: 220 [P.O.:220] Out: -    Lab Results: Recent Labs    02/10/22 0818 02/11/22 0319 02/12/22 0340  WBC 3.7* 3.8* 4.2  HGB 9.0* 8.3* 8.3*  HCT 27.0* 25.7* 25.5*  PLT 62* 59* 61*   BMET Recent Labs    02/10/22 0818 02/11/22 0319 02/12/22 0340  NA 135 138 137  K 3.6 3.6 3.4*  CL 105 109 107  CO2 $Re'22 24 23  'Piv$ GLUCOSE 157* 151* 148*  BUN 5* 7* 7*  CREATININE 0.80 0.71 0.98  CALCIUM 8.5* 8.4* 8.3*   Hepatic Function Latest Ref Rng & Units 02/12/2022 02/11/2022 02/10/2022  Total Protein 6.5 - 8.1 g/dL 6.4(L) 6.3(L) 6.7  Albumin 3.5 - 5.0 g/dL 2.7(L) 2.6(L) 2.8(L)  AST 15 - 41 U/L 114(H) 89(H) 46(H)  ALT 0 - 44 U/L 98(H) 74(H) 55(H)  Alk Phosphatase 38 - 126 U/L 355(H) 293(H) 329(H)  Total Bilirubin 0.3 - 1.2 mg/dL 0.6 0.5 0.5  Bilirubin, Direct 0.0 - 0.2 mg/dL - - -       Assessment / Plan:   Assessment: 1.  Rectal pain: Likely fissure or hemorrhoids, seems to be improving with  Anusol and numbing cream as needed 2.  Acute diverticulitis: Diagnosed this admission and improved after treatment with antibiotics 3.  Diarrhea: Continues, this could be related to above and antibiotic usage +/- the fact that patient has COVID and is on chemo 4.  Family history of colon cancer: In patient's father and mother 64.  Elevated LFTs: Continue to increase today, work-up is pending 6.  COVID-positive 7.  Endometrial cancer 8.  CKD stage III 9.  Chronic diastolic heart failure  Plan: Continue to monitor LFTs Work-up pending for above We will start Imodium daily Again patient will need colonoscopy but this will be 6 to 8 weeks from now given recent diverticulitis.  This is explained to the patient's daughter yesterday.  Thank you for your kind consultation, we will continue to follow.   LOS: 7 days   Levin Erp  02/12/2022, 9:08 AM

## 2022-02-12 NOTE — TOC Progression Note (Addendum)
Transition of Care [facility]) - Progression Note    Patient Details  Name: [name] MRN: [id number] Date of Birth: 1956-09-06  Transition of Care [facility]) CM/SW Contact  Ross Ludwig, [place] Phone Number: 02/12/2022, 4:17 PM  Clinical Narrative:     Patient does not have insurance and was denied for [facility] by charity last week.  CSW to continue to follow patient's progress throughout discharge planning.  CSW asked [facility] who is covering charity this week to review patient for [facility] PT.  Waiting for a decision.  Expected Discharge Plan: [place] Barriers to Discharge: No Barriers Identified  Expected Discharge Plan and Services Expected Discharge Plan: [place]   Discharge Planning Services: CM Consult   Living arrangements for the past 2 months: Single Family Home                                       Social Determinants of Health (SDOH) Interventions    Readmission Risk Interventions Readmission Risk Prevention Plan 01/10/2022  Transportation Screening Complete  PCP or Specialist Appt within 5-7 Days Complete  Home Care Screening Complete  Medication Review (RN CM) Complete  Some recent data might be hidden

## 2022-02-12 NOTE — Progress Notes (Signed)
Nutrition Follow-up  DOCUMENTATION CODES:   Obesity unspecified  INTERVENTION:   -Switched to [employer] po TID, each supplement provides 250 kcal and 9 grams of protein  -d/c Boost Plus per pt request  -Encourage good PO intakes  -48 hour Calorie Count  NUTRITION DIAGNOSIS:   Increased nutrient needs related to cancer and cancer related treatments as evidenced by estimated needs.  Ongoing.  GOAL:   Patient will meet greater than or equal to 90% of their needs  Progressing.  MONITOR:   PO intake, Supplement acceptance, Labs, Weight trends, I & O's  ASSESSMENT:   66 y.o. female with a history of endometrial cancer on chemotherapy, hypertension, hyperlipidemia, GERD, diabetes. Patient presented secondary to diarrhea and confusion. Workup in the ED revealed evidence of acute diverticulitis.  Patient reports her appetite has improved. She is eating better.   Recent intakes: 2/25: consumed 50-75% of meals 2/26: not documented 2/27: consumed 100% of breakfast but no other meals documented  Pt states she thinks the Glucerna shakes were causing diarrhea. She was switched to Boost Plus. Pt states today she will not drink the Boost, she thinks she cannot tolerate the dairy. Offered our plant based protein shake or to try clear supplements Boost Breeze. Pt wants to try Boost Breeze. Will order.   Admission weight: 215 lbs. No other weights this admission.  Medications: Vitamin C, Tums, Lasix, Imodium, Multivitamin with minerals daily, florastor  Labs reviewed: CBGs: 119-164 Low K  Diet Order:   Diet Order             Diet Carb Modified Fluid consistency: Thin; Room service appropriate? Yes  Diet effective now                   EDUCATION NEEDS:   No education needs have been identified at this time  Skin:  Skin Assessment: Reviewed RN Assessment  Last BM:  2/24  Height:   Ht Readings from Last 1 Encounters:  02/05/22 5\' 2"  (1.575 m)    Weight:    Wt Readings from Last 1 Encounters:  02/04/22 97.5 kg    BMI:  Body mass index is 39.32 kg/m.  Estimated Nutritional Needs:   Kcal:  6269-4854  Protein:  85-105g  Fluid:  2L/day  Clayton Bibles, MS, RD, LDN Inpatient Clinical Dietitian Contact information available via Amion

## 2022-02-12 NOTE — Discharge Planning (Signed)
Oncology Discharge Planning Admission Note  [facility] Address: [address], [place], [place] [address] Hours of Operation:  8am - 5pm, Monday - Friday  Clinic Contact Information:  [phone]  Oncology Care Team: Medical Oncologist:  Dr.Ni Gorsuch  Dr Alvy Bimler and Myrtha Mantis - NP are aware of this hospital admission dated 02/04/22 and has been assessed at the bedside.  The cancer center will follow [name] inpatient care to assist with discharge planning as indicated by the oncologist.  We will arrange for necessary outpatient care closer to discharge.  Disclaimer:  This [place] note does not imply a formal consult request has been made by the admitting attending for this admission or there will be an inpatient consult completed by oncology.  Please request oncology consults as per standard process as indicated.

## 2022-02-12 NOTE — Progress Notes (Addendum)
[name]   DOB:January 07, 1956   HK#:[id number]    I saw the patient, examined her and agree with documentation as follows  ASSESSMENT & PLAN:  Recurrent uterine cancer I have reviewed her CT imaging Lymphadenopathy is stable Continue supportive care We will delay her next treatment for 21 days from the date of diagnosis Frankly, given recurrent hospitalization, it is not clear to me she can tolerate further chemotherapy I will resume discussion in the outpatient  Acquired pancytopenia Likely exacerbated by recent antibiotics and recent chemotherapy Monitor closely She was transfused recently Observe closely She does not need blood today No contraindication for her to receive anticoagulation therapy as HER platelet count is greater than 50,000  Elevated troponin, history of heart failure Could be exacerbated by recent use of trastuzumab Echocardiogram performed 02/07/2022 showed LVEF of 60 to 65% However, given her high risk for, I do not believe she is a candidate for further treatment with trastuzumab in the future  Diverticulitis She continues to have abdominal discomfort and loose stools Trying to eat more She will continue taking pain medicine and antibiotics GI following and no plans for colonoscopy at this time Continue supportive care  Severe protein calorie malnutrition She has not been eating well for the past month, in fact since last hospitalization from January She has failed outpatient IV fluid support, IV pain medicine and IV Zofran She is trying to eat more Receiving Glucerna shakes but this may be exacerbating her diarrhea and would recommend finding an alternative supplement If she does not meet dietary requirements, do not discharge She might need total parenteral nutrition We will start her on calorie count  Recent fall She is very debilitated Consider PT while hospitalized  COVID positivity She is at extremely high risk due to immunocompromise state  from recent chemotherapy, history of congestive heart failure and now severe pancytopenia Completed Paxlovid  Discharge planning She is not ready to be discharged She does not have established primary care doctor She does not qualify for home health due to lack of insurance Due to COVID positivity, I cannot bring her back to the cancer center for further supportive care for 21 days and she has failed outpatient therapy prior to admission Please do not discharge her until she is able to eat and drink reasonably normal.  Premature discharge from hospital could lead to death Her daughter is updated  Mikey Bussing, NP 02/12/2022 7:58 AM Heath Lark, MD  Subjective:  Continues to have loose stools and abdominal discomfort Rectal pain improved Overall, she is trying to eat more  No nausea or vomiting reported  Objective:  Vitals:   02/11/22 2100 02/12/22 0604  BP: (!) 198/82 (!) 164/98  Pulse: 83 78  Resp: 17 17  Temp: 98.4 F (36.9 C) 97.8 F (36.6 C)  SpO2: 100% 100%     Intake/Output Summary (Last 24 hours) at 02/12/2022 0758 Last data filed at 02/11/2022 0900 Gross per 24 hour  Intake 220 ml  Output --  Net 220 ml    GENERAL:alert, no distress and comfortable SKIN: skin color, texture, turgor are normal, no rashes or significant lesions EYES: normal, Conjunctiva are pink and non-injected, sclera clear OROPHARYNX:no exudate, no erythema and lips, buccal mucosa, and tongue normal  NECK: supple, thyroid normal size, non-tender, without nodularity LYMPH:  no palpable lymphadenopathy in the cervical, axillary or inguinal LUNGS: clear to auscultation and percussion with normal breathing effort HEART: regular rate & rhythm and no murmurs and no lower extremity  edema ABDOMEN:abdomen soft, diffuse tenderness without rebound or guarding Musculoskeletal:no cyanosis of digits and no clubbing  NEURO: alert & oriented x 3 with fluent speech, no focal motor/sensory deficits   Labs:   Recent Labs    02/10/22 0818 02/11/22 0319 02/12/22 0340  NA 135 138 137  K 3.6 3.6 3.4*  CL 105 109 107  CO2 22 24 23   GLUCOSE 157* 151* 148*  BUN 5* 7* 7*  CREATININE 0.80 0.71 0.98  CALCIUM 8.5* 8.4* 8.3*  GFRNONAA >60 >60 >60  PROT 6.7 6.3* 6.4*  ALBUMIN 2.8* 2.6* 2.7*  AST 46* 89* 114*  ALT 55* 74* 98*  ALKPHOS 329* 293* 355*  BILITOT 0.5 0.5 0.6    Studies: I have reviewed her CT imaging DG Shoulder Right  Result Date: 02/04/2022 CLINICAL DATA:  Uterine cancer.  Diarrhea.  Right shoulder pain. EXAM: RIGHT SHOULDER - 2+ VIEW COMPARISON:  None. FINDINGS: There is diffuse decreased bone mineralization. Mild acromioclavicular joint space narrowing with mild inferior degenerative osteophytes. Mild inferior glenoid degenerative osteophytosis. No acute fracture is seen. No dislocation. Partial visualization of right chest port a catheter. 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 Electronically Signed   By: Yvonne Kendall M.D.   On: 02/04/2022 19:08   CT Head Wo Contrast  Result Date: 02/04/2022 CLINICAL DATA:  Mental status change, unknown cause EXAM: CT HEAD WITHOUT CONTRAST TECHNIQUE: Contiguous axial images were obtained from the base of the skull through the vertex without intravenous contrast. RADIATION DOSE REDUCTION: This exam was performed according to the departmental dose-optimization program which includes automated exposure control, adjustment of the mA and/or kV according to patient size and/or use of iterative reconstruction technique. COMPARISON:  None. FINDINGS: Brain: There is no acute intracranial hemorrhage, mass effect, or edema. Gray-white differentiation is preserved. Small focus of low density in the white matter adjacent to the posterior body of the left lateral ventricle. There is no extra-axial fluid collection. Ventricles and sulci are within normal limits in size and configuration. Vascular: There is atherosclerotic  calcification at the skull base. Skull: Calvarium is unremarkable. Sinuses/Orbits: No acute finding. Other: None. IMPRESSION: No acute intracranial hemorrhage. 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 Electronically Signed   By: Macy Mis M.D.   On: 02/04/2022 16:55   CT Abdomen Pelvis W Contrast  Result Date: 02/04/2022 CLINICAL DATA:  Abdominal pain.  History of uterine cancer EXAM: CT ABDOMEN AND PELVIS WITH CONTRAST TECHNIQUE: Multidetector CT imaging of the abdomen and pelvis was performed using the standard protocol following bolus administration of intravenous contrast. RADIATION DOSE REDUCTION: This exam was performed according to the departmental dose-optimization program which includes automated exposure control, adjustment of the mA and/or kV according to patient size and/or use of iterative reconstruction technique. CONTRAST:  138mL OMNIPAQUE IOHEXOL 300 MG/ML  SOLN COMPARISON:  01/28/2022 FINDINGS: Lower chest: Included lung bases are clear. Heart size within normal limits. Hepatobiliary: No focal liver abnormality is seen. No gallstones, gallbladder wall thickening, or biliary dilatation. Pancreas: Unremarkable. No pancreatic ductal dilatation or surrounding inflammatory changes. Spleen: Normal in size without focal abnormality. Adrenals/Urinary Tract: Unremarkable adrenal glands. Kidneys enhance symmetrically without focal lesion, stone, or hydronephrosis. Ureters are nondilated. Urinary bladder appears unremarkable. Stomach/Bowel: Colonic diverticulosis with new mild pericolonic fat stranding adjacent to a prominent diverticula at the proximal sigmoid colon where there is short segment bowel thickening (series 5, images 66-68). Scattered diverticulosis within the remaining sigmoid and descending colon.  No additional sites of focal inflammation. Normal appendix in the right lower quadrant. No dilated loops of bowel.  Stomach within normal limits. Vascular/Lymphatic: Scattered atherosclerotic calcifications throughout the aortoiliac axis. Stable mildly enlarged left pelvic sidewall lymph node measuring approximately 1.3 cm short axis (series 5, image 74). Reproductive: Prior hysterectomy. Stable prominence at the vaginal cuff, indeterminate. No adnexal masses. Other: Mild presacral fluid/edema. No organized abdominopelvic fluid collection. No pneumoperitoneum. Musculoskeletal: No new or acute bony findings. IMPRESSION: 1. Acute uncomplicated sigmoid diverticulitis. 2. Prior hysterectomy with stable prominence at the vaginal cuff, indeterminate. Stable mildly enlarged left pelvic sidewall lymph node. Aortic Atherosclerosis (ICD10-I70.0). Electronically Signed   By: Davina Poke D.O.   On: 02/04/2022 16:58   CT Abdomen Pelvis W Contrast  Result Date: 01/28/2022 CLINICAL DATA:  Epigastric abdominal pain, shortness of breath, nausea vomiting, endometrial carcinoma receiving chemotherapy EXAM: CT ABDOMEN AND PELVIS WITH CONTRAST TECHNIQUE: Multidetector CT imaging of the abdomen and pelvis was performed using the standard protocol following bolus administration of intravenous contrast. RADIATION DOSE REDUCTION: This exam was performed according to the departmental dose-optimization program which includes automated exposure control, adjustment of the mA and/or kV according to patient size and/or use of iterative reconstruction technique. CONTRAST:  181mL OMNIPAQUE IOHEXOL 300 MG/ML  SOLN COMPARISON:  01/07/2022 FINDINGS: Lower chest: No acute abnormality. Hepatobiliary: No focal liver abnormality is seen. No gallstones, gallbladder wall thickening, or biliary dilatation. Pancreas: Unremarkable. No pancreatic ductal dilatation or surrounding inflammatory changes. Spleen: Normal in size without focal abnormality. Adrenals/Urinary Tract: Adrenal glands are unremarkable. Kidneys are normal, without renal calculi, focal lesion, or  hydronephrosis. Bladder is unremarkable. Stomach/Bowel: Similar nonspecific thickening of the duodenal C loop may be related to under distension versus improving duodenitis when compared to 01/07/2022. Negative for bowel obstruction, significant dilatation, ileus, or free air. Normal retrocecal appendix identified. Scattered colonic diverticulosis without acute inflammatory process. No free fluid, fluid collection, hemorrhage, hematoma, abscess or ascites. Vascular/Lymphatic: Aortic atherosclerosis noted without aneurysm, dissection or occlusive process. No retroperitoneal hemorrhage or hematoma. Mesenteric and renal vasculature appears to remain patent. No veno-occlusive process. Stable left iliac pelvic sidewall mild adenopathy measuring 1.4 cm in diameter, image 70/2. No inguinal adenopathy. Reproductive: Previous hysterectomy. Stable indeterminate soft tissue prominence of the vaginal cuff. No pelvic free fluid, fluid collection, hemorrhage or hematoma. No adnexal mass. Other: No abdominal wall hernia or abnormality. No abdominopelvic ascites. Musculoskeletal: Degenerative changes throughout the spine with lower lumbar facet arthropathy. No acute osseous finding. IMPRESSION: No acute intra-abdominal or pelvic finding by CT. Interval improvement in the previous mild duodenitis changes by CT when compared to 01/07/2022. Colonic diverticulosis without acute inflammatory process. Normal appendix Remote hysterectomy. Stable indeterminate soft tissue prominence of the vaginal cuff and left pelvic sidewall mild adenopathy, suspicious for residual disease. Aortic Atherosclerosis (ICD10-I70.0). Electronically Signed   By: Jerilynn Mages.  Shick M.D.   On: 01/28/2022 17:41   DG CHEST PORT 1 VIEW  Result Date: 02/09/2022 CLINICAL DATA:  COVID positive EXAM: PORTABLE CHEST 1 VIEW COMPARISON:  02/04/2022 FINDINGS: Cardiac size is within normal limits. Tip of right IJ chest port is seen in the superior vena cava. Lung fields are clear  of any infiltrates or pulmonary edema. There is no pleural effusion or pneumothorax. IMPRESSION: No active disease. Electronically Signed   By: Elmer Picker M.D.   On: 02/09/2022 09:31   DG Chest Port 1 View  Result Date: 02/04/2022 CLINICAL DATA:  History of uterine cancer. Diarrhea. Unable to eat for 4 days. EXAM:  PORTABLE CHEST 1 VIEW COMPARISON:  January 28, 2022 FINDINGS: The heart size and mediastinal contours are stable. Right central venous line is unchanged. Both lungs are clear. The visualized skeletal structures are unremarkable. IMPRESSION: No active disease. Electronically Signed   By: Abelardo Diesel M.D.   On: 02/04/2022 14:20   DG Chest Port 1 View  Result Date: 01/28/2022 CLINICAL DATA:  Dyspnea, uterine cancer on chemotherapy EXAM: PORTABLE CHEST 1 VIEW COMPARISON:  01/04/2022 chest radiograph. FINDINGS: Right internal jugular Port-A-Cath terminates in the lower third of the SVC. Stable cardiomediastinal silhouette with normal heart size. No pneumothorax. No pleural effusion. Lungs appear clear, with no acute consolidative airspace disease and no pulmonary edema. IMPRESSION: No active disease. Electronically Signed   By: Ilona Sorrel M.D.   On: 01/28/2022 15:18   DG Abd Portable 1V  Result Date: 02/09/2022 CLINICAL DATA:  Abdominal pain EXAM: PORTABLE ABDOMEN - 1 VIEW COMPARISON:  CT abdomen done on 02/04/2022 FINDINGS: Bowel gas pattern is nonspecific. Is no small bowel dilation. Gas and stool are present in colon. No abnormal masses are seen. There is faint 11 mm ring-like calcific density in the right mid abdomen which corresponds to a diverticulum with high density in the ascending colon seen in the previous CT. Kidneys are partly obscured by bowel contents. IMPRESSION: Nonspecific bowel gas pattern. Electronically Signed   By: Elmer Picker M.D.   On: 02/09/2022 09:30   ECHOCARDIOGRAM COMPLETE  Result Date: 02/07/2022    ECHOCARDIOGRAM REPORT   Patient Name:   [name] Date of Exam: 02/07/2022 Medical Rec #:  [id number]     Height:       62.0 in Accession #:    [id number]    Weight:       215.0 lb Date of Birth:  1956-06-27    BSA:          1.971 m Patient Age:    66 years      BP:           137/54 mmHg Patient Gender: F             HR:           73 bpm. Exam Location:  Inpatient Procedure: 2D Echo, Cardiac Doppler, Color Doppler and Strain Analysis Indications:    Elevated troponin  History:        Patient has prior history of Echocardiogram examinations, most                 recent 12/25/2021. CAD and Angina; Risk Factors:Hypertension,                 Diabetes and Dyslipidemia. 06/07/2013 stent.  Sonographer:    Roxana Referring Phys: [id number] Caliya Narine [place]  1. Density in right atrium (probably related to porta cath); not as well visualized on previous study.  2. Left ventricular ejection fraction, by estimation, is 60 to 65%. The left ventricle has normal function. The left ventricle has no regional wall motion abnormalities. There is mild left ventricular hypertrophy. Left ventricular diastolic parameters are consistent with Grade I diastolic dysfunction (impaired relaxation). Elevated left atrial pressure.  3. Right ventricular systolic function is normal. The right ventricular size is normal.  4. Left atrial size was moderately dilated.  5. The mitral valve is normal in structure. Mild mitral valve regurgitation. No evidence of mitral stenosis.  6. The aortic valve is tricuspid. Aortic valve regurgitation is not visualized. Aortic valve sclerosis is  present, with no evidence of aortic valve stenosis.  7. The inferior vena cava is normal in size with greater than 50% respiratory variability, suggesting right atrial pressure of 3 mmHg. FINDINGS  Left Ventricle: Left ventricular ejection fraction, by estimation, is 60 to 65%. The left ventricle has normal function. The left ventricle has no regional wall motion abnormalities. The left ventricular  internal cavity size was normal in size. There is  mild left ventricular hypertrophy. Left ventricular diastolic parameters are consistent with Grade I diastolic dysfunction (impaired relaxation). Elevated left atrial pressure. Right Ventricle: The right ventricular size is normal. Right ventricular systolic function is normal. Left Atrium: Left atrial size was moderately dilated. Right Atrium: Right atrial size was normal in size. Pericardium: There is no evidence of pericardial effusion. Mitral Valve: The mitral valve is normal in structure. Mild mitral annular calcification. Mild mitral valve regurgitation. No evidence of mitral valve stenosis. MV peak gradient, 7.6 mmHg. The mean mitral valve gradient is 3.0 mmHg. Tricuspid Valve: The tricuspid valve is normal in structure. Tricuspid valve regurgitation is trivial. No evidence of tricuspid stenosis. Aortic Valve: The aortic valve is tricuspid. Aortic valve regurgitation is not visualized. Aortic valve sclerosis is present, with no evidence of aortic valve stenosis. Aortic valve mean gradient measures 6.0 mmHg. Aortic valve peak gradient measures 10.5 mmHg. Aortic valve area, by VTI measures 3.74 cm. Pulmonic Valve: The pulmonic valve was normal in structure. Pulmonic valve regurgitation is not visualized. No evidence of pulmonic stenosis. Aorta: The aortic root is normal in size and structure. Venous: The inferior vena cava is normal in size with greater than 50% respiratory variability, suggesting right atrial pressure of 3 mmHg. IAS/Shunts: No atrial level shunt detected by color flow Doppler. Additional Comments: Density in right atrium (probably related to porta cath); not as well visualized on previous study.  LEFT VENTRICLE PLAX 2D LVIDd:         4.10 cm   Diastology LVIDs:         2.30 cm   LV e' medial:    5.55 cm/s LV PW:         1.40 cm   LV E/e' medial:  19.5 LV IVS:        1.20 cm   LV e' lateral:   8.70 cm/s LVOT diam:     2.30 cm   LV E/e'  lateral: 12.4 LV SV:         122 LV SV Index:   62 LVOT Area:     4.15 cm  RIGHT VENTRICLE RV Basal diam:  3.30 cm RV Mid diam:    2.90 cm RV S prime:     14.30 cm/s TAPSE (M-mode): 2.4 cm LEFT ATRIUM           Index        RIGHT ATRIUM           Index LA diam:      3.70 cm 1.88 cm/m   RA Area:     12.40 cm LA Vol (A4C): 87.3 ml 44.28 ml/m  RA Volume:   27.00 ml  13.70 ml/m  AORTIC VALVE                     PULMONIC VALVE AV Area (Vmax):    3.69 cm      PV Vmax:       0.99 m/s AV Area (Vmean):   3.40 cm      PV Vmean:  65.150 cm/s AV Area (VTI):     3.74 cm      PV VTI:        0.185 m AV Vmax:           162.00 cm/s   PV Peak grad:  3.9 mmHg AV Vmean:          110.000 cm/s  PV Mean grad:  2.0 mmHg AV VTI:            0.327 m AV Peak Grad:      10.5 mmHg AV Mean Grad:      6.0 mmHg LVOT Vmax:         144.00 cm/s LVOT Vmean:        90.000 cm/s LVOT VTI:          0.294 m LVOT/AV VTI ratio: 0.90  AORTA Ao Root diam: 2.60 cm Ao Asc diam:  3.10 cm MITRAL VALVE                TRICUSPID VALVE MV Area (PHT): 3.74 cm     TR Peak grad:   42.0 mmHg MV Area VTI:   3.62 cm     TR Vmax:        324.00 cm/s MV Peak grad:  7.6 mmHg MV Mean grad:  3.0 mmHg     SHUNTS MV Vmax:       1.38 m/s     Systemic VTI:  0.29 m MV Vmean:      77.0 cm/s    Systemic Diam: 2.30 cm MV Decel Time: 203 msec MR Peak grad: 109.4 mmHg MR Mean grad: 75.0 mmHg MR Vmax:      523.00 cm/s MR Vmean:     414.0 cm/s MV E velocity: 108.00 cm/s MV A velocity: 121.00 cm/s MV E/A ratio:  0.89 Kirk Ruths MD Electronically signed by Kirk Ruths MD Signature Date/Time: 02/07/2022/2:40:51 PM    Final    VAS [place] LOWER EXTREMITY VENOUS (DVT)  Result Date: 02/07/2022  Lower Venous DVT Study Patient Name:  [name]  Date of Exam:   02/07/2022 Medical Rec #: [id number]      Accession #:    [id number] Date of Birth: 1956/05/08     Patient Gender: F Patient Age:   46 years Exam Location:  [facility] Procedure:      VAS [place] LOWER EXTREMITY VENOUS  (DVT) Referring Phys: PRANAV PATEL --------------------------------------------------------------------------------  Indications: Edema.  Comparison Study: 07/19/2018- negative right lower extremity venous duplex Performing Technologist: Maudry Mayhew MHA, RDMS, RVT, RDCS  Examination Guidelines: A complete evaluation includes B-mode imaging, spectral Doppler, color Doppler, and power Doppler as needed of all accessible portions of each vessel. Bilateral testing is considered an integral part of a complete examination. Limited examinations for reoccurring indications may be performed as noted. The reflux portion of the exam is performed with the patient in reverse Trendelenburg.  +---------+---------------+---------+-----------+----------+--------------+  RIGHT     Compressibility Phasicity Spontaneity Properties Thrombus Aging  +---------+---------------+---------+-----------+----------+--------------+  CFV       Full            Yes       Yes                                    +---------+---------------+---------+-----------+----------+--------------+  SFJ       Full                                                             +---------+---------------+---------+-----------+----------+--------------+  FV Prox   Full                                                             +---------+---------------+---------+-----------+----------+--------------+  FV Mid    Full                                                             +---------+---------------+---------+-----------+----------+--------------+  FV Distal Full                                                             +---------+---------------+---------+-----------+----------+--------------+  PFV       Full                                                             +---------+---------------+---------+-----------+----------+--------------+  POP       Full            Yes       Yes                                     +---------+---------------+---------+-----------+----------+--------------+  PTV       Full                                                             +---------+---------------+---------+-----------+----------+--------------+  PERO      Full                                                             +---------+---------------+---------+-----------+----------+--------------+   +---------+---------------+---------+-----------+----------+--------------+  LEFT      Compressibility Phasicity Spontaneity Properties Thrombus Aging  +---------+---------------+---------+-----------+----------+--------------+  CFV       Full            Yes       Yes                                    +---------+---------------+---------+-----------+----------+--------------+  SFJ       Full                                                             +---------+---------------+---------+-----------+----------+--------------+  FV Prox   Full                                                             +---------+---------------+---------+-----------+----------+--------------+  FV Mid    Full                                                             +---------+---------------+---------+-----------+----------+--------------+  FV Distal Full                                                             +---------+---------------+---------+-----------+----------+--------------+  PFV       Full                                                             +---------+---------------+---------+-----------+----------+--------------+  POP       Full            Yes       Yes                                    +---------+---------------+---------+-----------+----------+--------------+  PTV       Full                                                             +---------+---------------+---------+-----------+----------+--------------+  PERO      Full                                                              +---------+---------------+---------+-----------+----------+--------------+     Summary: RIGHT: - There is no evidence of deep vein thrombosis in the lower extremity.  - No cystic structure found in the popliteal fossa.  LEFT: - There is no evidence of deep vein thrombosis in the lower extremity.  - No cystic structure found in the popliteal fossa.  *See table(s) above for measurements and observations. Electronically signed by Orlie Pollen on 02/07/2022 at 6:12:48 PM.    Final

## 2022-02-12 NOTE — Progress Notes (Signed)
Progress Note   Patient: [name] NGE:[id number] DOB: 1956/02/12 DOA: 02/04/2022     Hospitalization day: 7 DOS: the patient was seen and examined on 02/12/2022   Brief hospital course: [name] is a 66 y.o. female with a history of endometrial cancer on chemotherapy, hypertension, hyperlipidemia, GERD, diabetes. Patient presented secondary to diarrhea and confusion. Workup in the ED revealed evidence of acute diverticulitis. She was started on empiric antibiotics. Diarrhea of uncertain etiology at this time. IV fluids initiated. Also incidentally found to have COVID-19 infection, on room air and without pulmonary infiltrates. Started on Paxlovid. CRP significantly elevated.  Assessment and Plan: * Acute diverticulitis- (present on admission) CT scan shows mild sigmoid colon diverticulitis. Started on IV Zosyn. Later on transition to oral Augmentin.  Now on Omnicef Flagyl.  Last day of antibiotic 3/1. Cultures so far negative. GI pathogen panel and C. difficile negative. Treated with Questran and Imodium.  GI consulted.  Outpatient colonoscopy recommended.  LFT elevation Appreciate GI assistance. Monitor.  Likely in the setting of antibiotics.  Rectal pain- (present on admission) CT abdomen unremarkable. Patient reports gradual improvement in pain. No significant hard mass or lesion identified on rectal exam.  Anemia due to antineoplastic chemotherapy- (present on admission) Thrombocytopenia. Chemotherapy-induced pancytopenia. Oncology consulted appreciate assistance. Monitor for hemoglobin and platelet stability. Transfuse for hemoglobin less than 7.  S/P 1 PRBC transfusion on 2/25. Hemoglobin remained stable after transfusion. Platelet count remained stable. Per oncology okay to resume DVT prophylaxis as long as platelets remain above 50.  COVID-19 virus infection- (present on admission) Incidental. Asymptomatic. Chest x-ray clear.  Patient is immunocompromised  secondary to known cancer on chemotherapy.  Diagnosed on 2/20 and will need isolation for 21 days. Completed Paxlovid x5 days  Chronic kidney disease, stage 3a ([place])- (present on admission) Baseline serum creatinine around 1.1. Currently serum creatinine normal. Remaining stable.  Papillary serous adenocarcinoma of endometrium ([place])- (present on admission) Patient follows with Dr. Alvy Bimler and is currently receiving chemotherapy. Last treatment on 01/22/22. Appreciate Dr. Alvy Bimler assistance. Patient reported 1 episode of vaginal bleeding on 2/22.  No further bleeding reported after that.  Uncontrolled type 2 diabetes mellitus with hyperglycemia, without long-term current use of insulin ([place])- (present on admission) Patient is on Trulicity, glimepiride and metformin as an outpatient. Last hemoglobin A1C of 7.9%. Uncontrolled. -Hold home regimen -SSI while inpatient  Right shoulder pain- (present on admission) Shoulder XR negative. Monitor.  Chronic diastolic heart failure ([place])- (present on admission) No evidence of exacerbation. History of normal LVEF with grade 1 diastolic dysfunction.  Patient is on lasix as an outpatient, resume  Dyslipidemia- (present on admission) Patient is on Lipitor as an outpatient.  Held while the patient was on Paxlovid. We will resume once LFTs improved.  Obesity (BMI 30-39.9)- (present on admission) Body mass index is 39.32 kg/m.  Placing the patient at high risk of poor outcome.  Hypertension- (present on admission) Patient is on Imdur, lisinopril and metoprolol as an outpatient.  Blood pressure significantly uncontrolled. Continue lisinopril, metoprolol, Imdur, Lasix, Norvasc and hydralazine as needed.  Subjective: No nausea no vomiting no fever no chills.  No chest pain abdominal pain.  Oral intake improving.  Physical Exam: Vitals:   02/11/22 1452 02/11/22 2100 02/12/22 0604 02/12/22 1432  BP: (!) 169/82 (!) 198/82 (!) 164/98 (!) 198/99   Pulse: 68 83 78 82  Resp: 18 17 17 16   Temp: 98.2 F (36.8 C) 98.4 F (36.9 C) 97.8 F (36.6 C) 97.8  F (36.6 C)  TempSrc: Oral Oral Oral Oral  SpO2: 98% 100% 100% 100%  Weight:      Height:       General: Appear in mild distress; no visible Abnormal Neck Mass Or lumps, Conjunctiva normal Cardiovascular: S1 and S2 Present, no Murmur, Respiratory: good respiratory effort, Bilateral Air entry present and CTA, no Crackles, no wheezes Abdomen: Bowel Sound present Extremities: trace Pedal edema Neurology: alert and oriented to time, place, and person Gait not checked due to patient safety concerns   Data Reviewed:  I have Reviewed nursing notes, Vitals, and Lab results since pt's last encounter. Pertinent lab results CBC and CMP I have ordered test including CBC and CMP I have reviewed the last note from GI and oncology,  I have discussed pt's care plan and test results with dietitian.   Family Communication: None at bedside, discussed with daughter on 2/27.  Disposition: Status is: Inpatient Remains inpatient appropriate because: LFT continues to worsen.  GI perform further work-up. Ongoing diarrhea.  Author: Berle Mull, MD 02/12/2022 6:05 PM  For on call review [email or web address].

## 2022-02-13 ENCOUNTER — Inpatient Hospital Stay (HOSPITAL_COMMUNITY): Payer: Medicaid Other

## 2022-02-13 LAB — COMPREHENSIVE METABOLIC PANEL
ALT: 105 U/L — ABNORMAL HIGH (ref 0–44)
AST: 89 U/L — ABNORMAL HIGH (ref 15–41)
Albumin: 2.8 g/dL — ABNORMAL LOW (ref 3.5–5.0)
Alkaline Phosphatase: 340 U/L — ABNORMAL HIGH (ref 38–126)
Anion gap: 7 (ref 5–15)
BUN: 11 mg/dL (ref 8–23)
CO2: 23 mmol/L (ref 22–32)
Calcium: 8.6 mg/dL — ABNORMAL LOW (ref 8.9–10.3)
Chloride: 108 mmol/L (ref 98–111)
Creatinine, Ser: 0.83 mg/dL (ref 0.44–1.00)
GFR, Estimated: >60 mL/min (ref >60)
Glucose, Bld: 216 mg/dL — ABNORMAL HIGH (ref 70–99)
Potassium: 3.8 mmol/L (ref 3.5–5.1)
Sodium: 138 mmol/L (ref 135–145)
Total Bilirubin: 0.4 mg/dL (ref 0.3–1.2)
Total Protein: 6.7 g/dL (ref 6.5–8.1)

## 2022-02-13 LAB — GLUCOSE, CAPILLARY
Glucose-Capillary: 192 mg/dL — ABNORMAL HIGH (ref 70–99)
Glucose-Capillary: 155 mg/dL — ABNORMAL HIGH (ref 70–99)
Glucose-Capillary: 199 mg/dL — ABNORMAL HIGH (ref 70–99)
Glucose-Capillary: 160 mg/dL — ABNORMAL HIGH (ref 70–99)

## 2022-02-13 MED ORDER — PROSOURCE PLUS PO LIQD
30.0000 mL | Freq: Three times a day (TID) | ORAL | Status: DC
  Administered 2022-02-13 – 2022-02-14 (×2): 30 mL via ORAL
  Filled 2022-02-13 (×2): qty 30

## 2022-02-13 MED ORDER — FUROSEMIDE 20 MG PO TABS
20.0000 mg | ORAL_TABLET | ORAL | Status: DC
  Administered 2022-02-15 – 2022-02-17 (×2): 20 mg via ORAL
  Filled 2022-02-13 (×2): qty 1

## 2022-02-13 MED ORDER — LOPERAMIDE HCL 2 MG PO CAPS
2.0000 mg | ORAL_CAPSULE | Freq: Three times a day (TID) | ORAL | Status: DC
  Administered 2022-02-13 – 2022-02-16 (×10): 2 mg via ORAL
  Filled 2022-02-13 (×10): qty 1

## 2022-02-13 MED ORDER — RESOURCE INSTANT PROTEIN PO PWD PACKET
1.0000 | Freq: Three times a day (TID) | ORAL | Status: DC
  Filled 2022-02-13 (×17): qty 6

## 2022-02-13 NOTE — Progress Notes (Signed)
? ?   Progress Note ? ? Subjective  ?Chief Complaint: Diarrhea, rectal pain, increased LFTs ? ?Patient is growing frustrated with ongoing diarrhea, apparently has about 3-4 loose bowel movements that wake her from sleep around 3-4 in the morning and then oftentimes does not have any further after that, but this is giving her "rectal and vaginal" irritation.  She is tearful this morning.  Tells me she does not think that the Imodium helped.  Describes drinking a "orange liquid" in the evenings which she thinks is making her diarrhea worse.  Also some nausea and generalized stomach "uneasiness" today. ? ? Objective  ? ?Vital signs in last 24 hours: ?Temp:  [97.7 ?F (36.5 ?C)-98.4 ?F (36.9 ?C)] 97.7 ?F (36.5 ?C) (03/01 3202) ?Pulse Rate:  [72-85] 74 (03/01 3343) ?Resp:  [16-18] 18 (03/01 0629) ?BP: (155-198)/(74-107) 155/85 (03/01 5686) ?SpO2:  [99 %-100 %] 99 % (03/01 0629) ?Last BM Date : 02/12/22 ?General:    Overweight AA female in NAD ?Heart:  Regular rate and rhythm; no murmurs ?Lungs: Respirations even and unlabored, lungs CTA bilaterally ?Abdomen:  Soft, mild generalized TTP and nondistended. Normal bowel sounds. ?Psych:  Cooperative. Normal mood and affect. ? ?Intake/Output from previous day: ?02/28 0701 - 03/01 0700 ?In: 31 [P.O.:476] ?Out: -  ?Intake/Output this shift: ?Total I/O ?In: 66 [P.O.:480; I.V.:10] ?Out: -  ? ?Lab Results: ?Recent Labs  ?  02/11/22 ?0319 02/12/22 ?0340  ?WBC 3.8* 4.2  ?HGB 8.3* 8.3*  ?HCT 25.7* 25.5*  ?PLT 59* 61*  ? ?BMET ?Recent Labs  ?  02/11/22 ?0319 02/12/22 ?0340  ?NA 138 137  ?K 3.6 3.4*  ?CL 109 107  ?CO2 24 23  ?GLUCOSE 151* 148*  ?BUN 7* 7*  ?CREATININE 0.71 0.98  ?CALCIUM 8.4* 8.3*  ? ?Hepatic Function Latest Ref Rng & Units 02/12/2022 02/11/2022 02/10/2022  ?Total Protein 6.5 - 8.1 g/dL 6.4(L) 6.3(L) 6.7  ?Albumin 3.5 - 5.0 g/dL 2.7(L) 2.6(L) 2.8(L)  ?AST 15 - 41 U/L 114(H) 89(H) 46(H)  ?ALT 0 - 44 U/L 98(H) 74(H) 55(H)  ?Alk Phosphatase 38 - 126 U/L 355(H) 293(H) 329(H)   ?Total Bilirubin 0.3 - 1.2 mg/dL 0.6 0.5 0.5  ?Bilirubin, Direct 0.0 - 0.2 mg/dL - - -  ?  ? ? Assessment / Plan:   ?Assessment: ?1.  Elevated LFTs: LFTs were trending up yesterday, repeat pending for today, again likely Dili given recent antibiotics+/- COVID but acute hep panel and ANA are pending ?2.  Acute diverticulitis: Treated, no ongoing pain ?3.  Rectal pain: Resolved ?4.  Diarrhea: Continues and is very frustrating for the patient regardless of adding Imodium yesterday, stool studies including C. difficile are negative; likely combination of COVID and recent antibiotics ?5.  Family history of colon cancer ?6.  COVID-positive ?7.  Endometrial cancer ?8.  CKD stage III ?9.  Chronic diastolic heart failure ? ?Plan: ?1.  Repeat LFTs from this morning are still pending, would continue to trend while patient is hospitalized ?2.  Will increase Imodium and schedule 3 times daily with the last dose before bedtime to see if this helps slow stool that wake her up from sleep ?3.  Again patient will need colonoscopy but it will be 6 to 8 weeks from now given recent diverticulitis. ? ?Thank you for your kind consultation, we will continue to follow. ? ? LOS: 8 days  ? ?Lavone Nian [facility]  02/13/2022, 10:23 AM ? ?  ?

## 2022-02-13 NOTE — Progress Notes (Signed)
Occupational Therapy Discharge ?Patient Details ?Name: [name] ?MRN: [id number] ?DOB: 08-28-1956 ?Today's Date: 02/13/2022 ?Time: 0699-9672 ?OT Time Calculation (min): 7 min ? ? ? ?Patient discharged from OT services secondary to goals met and no further OT needs identified. ? ?Please see latest therapy progress note for current level of functioning and progress toward goals.   ? ?Progress and discharge plan discussed with patient and/or caregiver: Patient/Caregiver agrees with plan ? ?Patient supine in bed when therapist entered the room. Patient reports 10/10 pain and nausea. Patient is not able to currently tolerate therapy. However, she reports, and nursing verifies, that she is completely independent in room. She reports independence with all ADLs and gets up and sits in chair or window seat PRN. She reports having assistance of family at home at all times and has a shower chair for when she doesn't feel well. She reports no OT needs at this time. OT will sign off. No charge will be entered for this visit. ?   ? ?Deshawnda Acrey L Dayne Dekay ?02/13/2022, 12:59 PM  ?

## 2022-02-13 NOTE — Progress Notes (Addendum)
Calorie Count Note ? ?48 hour calorie count ordered. ? ?Diet: CHO modified ?Supplements:  ?-Boost Breeze po TID, each supplement provides 250 kcal and 9 grams of protein ? ? ?2/28: ?Breakfast: 300 kcals, 16g protein ?Lunch: 375 kcals, 14g protein ?Dinner: 230 kcals, 17g protein ?Supplements: Boost Breeze, 250 kcals, 9g protein ? ?Total intake: ?1155 kcal (66% of minimum estimated needs)  ?56g protein (65% of minimum estimated needs) ? ?3/1: ?Breakfast: 220 kcals, 13g protein ?Lunch: 65 kcals, 1g protein ?Dinner: 100 kcals, 5g protein ?Supplements: 1 Prosource Plus, 100 kcals, 15g protein ? ?Total intake: ?485 kcals (27% of minimum estimated needs)  ?34g protein (40% of minimum estimated needs) ? ?**Even with eating meals, pt needs to consume supplements or snacks in between meals to meet needs. Will add additional supplements for pt to try today. ?NT to alert RD of intakes today. ? ?3/2: Spoke with patient. Reports eating well yesterday. Pt does attempt to eat meals but does not consume enough to meet needs. She is still c/o diarrhea. Reviewed some food items to add to meals today to help bulk the stool  (bananas, rice, applesauce, toast, pasta). ?She is not drinking the [facility] and has not eaten any snacks yet. She agrees to taking the Prosource Plus today so will increase this to QID to provide (400 kcals, 60g protein). With Prosource x 4 +some meal intake, pt will at least meet her protein needs (~85-100g ). ? ?Nutrition Dx: Increased nutrient needs related to cancer and cancer related treatments as evidenced by estimated needs. ? ?Goal: Pt to meet >/= 90% of their estimated nutrition needs  ? ?Intervention:  ?-Boost Breeze po TID, each supplement provides 250 kcal and 9 grams of protein ?-Beneprotein TID,each provides 25 kcals, 6g protein ?-Prosource Plus PO QID, each provides 100 kcals and 15g protein ?-Continue afternoon and evening snacks ? ?Clayton Bibles, MS, RD, LDN ?Inpatient Clinical  Dietitian ?Contact information available via Amion ?  ?

## 2022-02-13 NOTE — Progress Notes (Signed)
Physical Therapy Treatment ?Patient Details ?Name: [name] ?MRN: [id number] ?DOB: 12/02/1956 ?Today's Date: 02/13/2022 ? ? ?History of Present Illness 66 y.o. female with medical history significant of endometrial cancer on chemotherapy, hypertension, hyperlipidemia, GERD, diabetes. Patient presented secondary to being "out of it," having "loose bowels," chest pain, poor appetite. Dx of diverticulitis, incidental COVID finding. ? ?  ?PT Comments  ? ? Pt has met/nearly met goals.  She is mobilizing and performing ADLs in room independently.  Ambulated 100' in hall without AD and VSS. Pt no longer requires skilled acute PT services - will sign off.    ?Recommendations for follow up therapy are one component of a multi-disciplinary discharge planning process, led by the attending physician.  Recommendations may be updated based on patient status, additional functional criteria and insurance authorization. ? ?Follow Up Recommendations ? No PT follow up ?  ?  ?Assistance Recommended at Discharge PRN  ?Patient can return home with the following Assistance with cooking/housework;Help with stairs or ramp for entrance ?  ?Equipment Recommendations ? Rollator (4 wheels)  ?  ?Recommendations for Other Services   ? ? ?  ?Precautions / Restrictions Precautions ?Precautions: None  ?  ? ?Mobility ? Bed Mobility ?Overal bed mobility: Modified Independent ?  ?  ?  ?  ?  ?  ?General bed mobility comments: Pt up in chair at arrival.  Pt reports (RN confirms) pt has been mobilizing in room without assist, does own ADLs ?  ? ?Transfers ?Overall transfer level: Modified independent ?Equipment used: Rolling walker (2 wheels) ?Transfers: Sit to/from Stand ?Sit to Stand: Modified independent (Device/Increase time) ?  ?  ?  ?  ?  ?General transfer comment: Had supervision during therapy but has been doing independently in room ?  ? ?Ambulation/Gait ?Ambulation/Gait assistance: Supervision ?Gait Distance (Feet): 100 Feet ?Assistive device:  None ?Gait Pattern/deviations: Step-through pattern ?Gait velocity: decr ?  ?  ?General Gait Details: Pt ambulating in hallway with supervision - occasional use of handrail.  Has been mobilizing in room independently. REports baseline mobility ? ? ?Stairs ?  ?  ?  ?  ?  ? ? ?Wheelchair Mobility ?  ? ?Modified Rankin (Stroke Patients Only) ?  ? ? ?  ?Balance Overall balance assessment: Modified Independent ?  ?  ?  ?  ?  ?  ?  ?  ?  ?  ?  ?  ?  ?  ?  ?  ?  ?  ?  ? ?  ?Cognition Arousal/Alertness: Awake/alert ?Behavior During Therapy: Flat affect ?Overall Cognitive Status: Within Functional Limits for tasks assessed ?  ?  ?  ?  ?  ?  ?  ?  ?  ?  ?  ?  ?  ?  ?  ?  ?General Comments: Very pleasant but just reports "I don't feel good", "wore out" ;required encouragment for therapy ?  ?  ? ?  ?Exercises   ? ?  ?General Comments General comments (skin integrity, edema, etc.): VSS ?  ?  ? ?Pertinent Vitals/Pain Pain Assessment ?Pain Assessment: No/denies pain  ? ? ?Home Living   ?  ?  ?  ?  ?  ?  ?  ?  ?  ?   ?  ?Prior Function    ?  ?  ?   ? ?PT Goals (current goals can now be found in the care plan section) Acute Rehab PT Goals ?PT Goal Formulation: All assessment and education  complete, DC therapy ?Progress towards PT goals: Goals met/education completed, patient discharged from PT ? ?  ?Frequency ? ? ?   ? ? ? ?  ?PT Plan Discharge plan needs to be updated  ? ? ?Co-evaluation   ?  ?  ?  ?  ? ?  ?AM-PAC PT "6 Clicks" Mobility   ?Outcome Measure ? Help needed turning from your back to your side while in a flat bed without using bedrails?: None ?Help needed moving from lying on your back to sitting on the side of a flat bed without using bedrails?: None ?Help needed moving to and from a bed to a chair (including a wheelchair)?: None ?Help needed standing up from a chair using your arms (e.g., wheelchair or bedside chair)?: None ?Help needed to walk in hospital room?: None ?Help needed climbing 3-5 steps with a railing? : A  Little ?6 Click Score: 23 ? ?  ?End of Session Equipment Utilized During Treatment: Gait belt ?Activity Tolerance: Patient tolerated treatment well ?Patient left: in chair;with call bell/phone within reach ?Nurse Communication: Mobility status ?PT Visit Diagnosis: Difficulty in walking, not elsewhere classified (R26.2);Pain ?  ? ? ?Time: 1031-2811 ?PT Time Calculation (min) (ACUTE ONLY): 13 min ? ?Charges:  $Gait Training: 8-22 mins          ?          ? ?Abran Richard, PT ?Acute Rehab Services ?Pager [phone] ?[facility] [phone] ? ? ? ?Mikael Spray Lanecia Sliva ?02/13/2022, 5:25 PM ? ?

## 2022-02-13 NOTE — Progress Notes (Signed)
?PROGRESS NOTE ? ? ?[name]  CVE:[id number] DOB: 1956-01-22 DOA: 02/04/2022 ?PCP: [name], MD  ?Brief Narrative:  ?89 blk home dwell fem ?Known history of recurrent uterine cancer stage IIIc status post TAH/BSO omentectomy selective lymph nodule removal-uterine carboplatin AUC 6/paclitaxel q. 21D/trastuzumab + XRT--ECOG status 3--- follows with Dr. Rozetta Nunnery ?HTN, HLD, reflux, DM TY 2 ?Last OV 01/31/2022 noted to be tolerating systemic chemo very poorly--also had fall X2 on 2/20+ bowel incontinence ?Presented to ED with diarrhea and CT scan showed mild diverticulitis ?Coincidently COVID-positive (2/20) and received 5 days of Paxlovid ?Initially was treated with Zosyn and then oral Augmentin as well as Omnicef/Flagyl-C. difficile never collected started on Questran ?Also had relatively elevated LFTs ?Being treated empirically for what seems like colonic diverticulitis-GI saw the patient in consult and have deferred colonoscopy at this time because of her acute onset ? ? ?Hospital-Problem based course ? ??  Diverticulitis on admission ?Etiology unclear-DDx chemo XRT with resulting mucositis/diarrhea [XRT might have caused this?] ?Patient getting abdominal 1 view today presumably to rule out obstipation ?GI pathogen panel -2/21, C. difficile -2/28 ?I Imodium increased to 3 times daily, continue cholestyramine 4 g daily, Florastor 250 twice daily, Tums daily ?patient continues on cefdinir and Flagyl--- stop date 02/16/2022 ?Does need outpatient colonoscopy to determine etiology especially as fecal occult positive-unclear if she is ever had any screening ?LFT elevation?  2/2 antibiotics ?Trend seems to be stable-bilirubin is 0.4-alpha-1 antitrypsin was 225-unclear how to interpret-defer to GI ?Could obtain gamma GGT as alk phos elevation may be bony related given her underlying malignancy? ?Recurrent uterine cancer stage IIIc ?Relative pancytopenia ?Appreciate Dr. Rozetta Nunnery input ?Baseline hemoglobin in the 10  range range and platelets in the 1 20-1 40--suspect cytopenias combination of chemo as well as acute illness transfuse 1 unit PRBC 2/25 ?Continue Lovenox if platelets above 50, continue MS IR 15 every 4 as needed severe pain ?Does not meet transfusion trigger at this time ?Iron studies show relatively saturated hemogram ferritin is elevated presumably as an acute phase reactant--- no real role for iron infusion at this time ?Asymptomatic COVID-19 ?As immunocompromise patient did receive Paxlovid X 5 days ?CRP has trended down nicely-patient is not requiring any oxygen ?DM TY 2 A1c 7.9 ?Patient not eating much today-sugars are controlled in the 1 50-2 15 range-continue sliding scale ?As outpatient resume Trulicity metformin glimepiride ?HFpEF grade 1 diastolic dysfunction without any exacerbation ?HTN ?Continue amlodipine 5, hydralazine as a as needed, Imdur 60, lisinopril 20, metoprolol 100 twice daily ?Does not appear significantly volume overloaded-change Lasix to q. other day ? ?DVT prophylaxis: Lovenox ?Code Status: Full ?Family Communication: None present today ?Disposition:  ?Status is: Inpatient ?Remains inpatient appropriate because:  ? ?Needs adequate PO input and nutrition --this cannot be reliably administered in the outpatient setting given her COVID-positive state and she will need to remain inpatient until her calorie count is completed and she is tolerating enough to be able to maintain nutrition and healing ? ? ?  ?Consultants:  ?Oncologist Dr. Rozetta Nunnery ?Gastroenterology Dr. Lorenso Courier ?As above ?Procedures:  ? ?Antimicrobials:   ? ? ?Subjective: ?Patient seems a little frustrated overall however she has been ambulating fairly well and has met all of the benchmarks from gastroenterology's perspective ?She has no chest pain no fever ?No nausea no vomiting ?She is not really eating as if food was cold ? ?Objective: ?Vitals:  ? 02/12/22 1927 02/13/22 0629 02/13/22 0258 02/13/22 1515  ?BP: (!) 166/74 (!)  163/107 (!) 155/85 Marland Kitchen)  158/96  ?Pulse: 85 72 74 92  ?Resp: '18 18  20  ' ?Temp: 98.4 ?F (36.9 ?C) 97.7 ?F (36.5 ?C)  97.7 ?F (36.5 ?C)  ?TempSrc: Oral Oral  Axillary  ?SpO2: 99% 99%  100%  ?Weight:      ?Height:      ? ? ?Intake/Output Summary (Last 24 hours) at 02/13/2022 1653 ?Last data filed at 02/13/2022 1535 ?Gross per 24 hour  ?Intake 970 ml  ?Output --  ?Net 970 ml  ? ?Filed Weights  ? 02/04/22 1307  ?Weight: 97.5 kg  ? ? ?Examination: ? ?Morbidly obese black female no distress ?EOMI NCAT no focal deficit ?CTA B no added sound no rales no rhonchi ?ROM intact ?Moving all 4 limbs equally without any focal deficit ?Neurologically intact ?Power 5/5 ? ?Data Reviewed: personally reviewed  ? ?CBC ?   ?Component Value Date/Time  ? WBC 4.2 02/12/2022 0340  ? RBC 2.95 (L) 02/12/2022 0340  ? HGB 8.3 (L) 02/12/2022 0340  ? HGB 9.8 (L) 01/31/2022 1230  ? HCT 25.5 (L) 02/12/2022 0340  ? PLT 61 (L) 02/12/2022 0340  ? PLT 141 (L) 01/31/2022 1230  ? MCV 86.4 02/12/2022 0340  ? MCH 28.1 02/12/2022 0340  ? MCHC 32.5 02/12/2022 0340  ? RDW 18.1 (H) 02/12/2022 0340  ? LYMPHSABS 1.5 02/12/2022 0340  ? MONOABS 0.4 02/12/2022 0340  ? EOSABS 0.0 02/12/2022 0340  ? BASOSABS 0.0 02/12/2022 0340  ? ?CMP Latest Ref Rng & Units 02/13/2022 02/12/2022 02/11/2022  ?Glucose 70 - 99 mg/dL 216(H) 148(H) 151(H)  ?BUN 8 - 23 mg/dL 11 7(L) 7(L)  ?Creatinine 0.44 - 1.00 mg/dL 0.83 0.98 0.71  ?Sodium 135 - 145 mmol/L 138 137 138  ?Potassium 3.5 - 5.1 mmol/L 3.8 3.4(L) 3.6  ?Chloride 98 - 111 mmol/L 108 107 109  ?CO2 22 - 32 mmol/L '23 23 24  ' ?Calcium 8.9 - 10.3 mg/dL 8.6(L) 8.3(L) 8.4(L)  ?Total Protein 6.5 - 8.1 g/dL 6.7 6.4(L) 6.3(L)  ?Total Bilirubin 0.3 - 1.2 mg/dL 0.4 0.6 0.5  ?Alkaline Phos 38 - 126 U/L 340(H) 355(H) 293(H)  ?AST 15 - 41 U/L 89(H) 114(H) 89(H)  ?ALT 0 - 44 U/L 105(H) 98(H) 74(H)  ? ? ? ?Radiology Studies: ?No results found. ? ? ?Scheduled Meds: ? (feeding supplement) PROSource Plus  30 mL Oral TID BM  ? amLODipine  5 mg Oral Daily  ?  vitamin C  500 mg Oral Daily  ? aspirin EC  81 mg Oral Daily  ? calcium carbonate  1 tablet Oral Daily  ? cefdinir  300 mg Oral Q12H  ? Chlorhexidine Gluconate Cloth  6 each Topical Daily  ? cholestyramine light  4 g Oral Q1200  ? enoxaparin (LOVENOX) injection  40 mg Subcutaneous Daily  ? feeding supplement  1 Container Oral TID BM  ? [START ON 02/15/2022] furosemide  20 mg Oral QODAY  ? hydrocortisone   Rectal BID  ? insulin aspart  0-15 Units Subcutaneous TID WC  ? isosorbide mononitrate  60 mg Oral Daily  ? lisinopril  20 mg Oral Daily  ? liver oil-zinc oxide   Topical BID  ? loperamide  2 mg Oral TID  ? metoprolol tartrate  100 mg Oral BID  ? metroNIDAZOLE  500 mg Oral Q12H  ? multivitamin with minerals  1 tablet Oral Daily  ? pantoprazole  40 mg Oral Daily  ? protein supplement  1 Scoop Oral TID WC  ? saccharomyces boulardii  250 mg Oral  BID  ? sodium chloride flush  10-40 mL Intracatheter Q12H  ? ?Continuous Infusions: ? ? LOS: 8 days  ? ?Time spent: 49 ? ?Nita Sells, MD ?[facility] ?To contact the attending provider between 7A-7P or the covering provider during after hours 7P-7A, please log into the web site [email or web address] and access using universal [place] password for that web site. If you do not have the password, please call the hospital operator. ? ?02/13/2022, 4:53 PM  ? ? ?

## 2022-02-14 DIAGNOSIS — R197 Diarrhea, unspecified: Secondary | ICD-10-CM

## 2022-02-14 DIAGNOSIS — R7989 Other specified abnormal findings of blood chemistry: Secondary | ICD-10-CM

## 2022-02-14 LAB — GLUCOSE, CAPILLARY
Glucose-Capillary: 175 mg/dL — ABNORMAL HIGH (ref 70–99)
Glucose-Capillary: 166 mg/dL — ABNORMAL HIGH (ref 70–99)
Glucose-Capillary: 158 mg/dL — ABNORMAL HIGH (ref 70–99)
Glucose-Capillary: 162 mg/dL — ABNORMAL HIGH (ref 70–99)

## 2022-02-14 LAB — CBC WITH DIFFERENTIAL/PLATELET
Abs Immature Granulocytes: 0.02 10*3/uL (ref 0.00–0.07)
Basophils Absolute: 0 10*3/uL (ref 0.0–0.1)
Basophils Relative: 0 %
Eosinophils Absolute: 0 10*3/uL (ref 0.0–0.5)
Eosinophils Relative: 0 %
HCT: 25.3 % — ABNORMAL LOW (ref 36.0–46.0)
Hemoglobin: 8 g/dL — ABNORMAL LOW (ref 12.0–15.0)
Immature Granulocytes: 0 %
Lymphocytes Relative: 24 %
Lymphs Abs: 1.3 10*3/uL (ref 0.7–4.0)
MCH: 28.2 pg (ref 26.0–34.0)
MCHC: 31.6 g/dL (ref 30.0–36.0)
MCV: 89.1 fL (ref 80.0–100.0)
Monocytes Absolute: 0.4 10*3/uL (ref 0.1–1.0)
Monocytes Relative: 8 %
Neutro Abs: 3.7 10*3/uL (ref 1.7–7.7)
Neutrophils Relative %: 68 %
Platelets: 55 10*3/uL — ABNORMAL LOW (ref 150–400)
RBC: 2.84 MIL/uL — ABNORMAL LOW (ref 3.87–5.11)
RDW: 18.1 % — ABNORMAL HIGH (ref 11.5–15.5)
WBC: 5.5 10*3/uL (ref 4.0–10.5)
nRBC: 0 % (ref 0.0–0.2)

## 2022-02-14 LAB — COMPREHENSIVE METABOLIC PANEL
ALT: 108 U/L — ABNORMAL HIGH (ref 0–44)
AST: 101 U/L — ABNORMAL HIGH (ref 15–41)
Albumin: 2.6 g/dL — ABNORMAL LOW (ref 3.5–5.0)
Alkaline Phosphatase: 395 U/L — ABNORMAL HIGH (ref 38–126)
Anion gap: 6 (ref 5–15)
BUN: 13 mg/dL (ref 8–23)
CO2: 24 mmol/L (ref 22–32)
Calcium: 8.7 mg/dL — ABNORMAL LOW (ref 8.9–10.3)
Chloride: 106 mmol/L (ref 98–111)
Creatinine, Ser: 0.88 mg/dL (ref 0.44–1.00)
GFR, Estimated: >60 mL/min (ref >60)
Glucose, Bld: 187 mg/dL — ABNORMAL HIGH (ref 70–99)
Potassium: 3.7 mmol/L (ref 3.5–5.1)
Sodium: 136 mmol/L (ref 135–145)
Total Bilirubin: 0.4 mg/dL (ref 0.3–1.2)
Total Protein: 6.1 g/dL — ABNORMAL LOW (ref 6.5–8.1)

## 2022-02-14 LAB — PROTIME-INR
INR: 1.2 (ref 0.8–1.2)
Prothrombin Time: 14.9 seconds (ref 11.4–15.2)

## 2022-02-14 LAB — IGG: IgG (Immunoglobin G), Serum: 397 mg/dL — ABNORMAL LOW (ref 586–1602)

## 2022-02-14 LAB — GAMMA GT: GGT: 789 U/L — ABNORMAL HIGH (ref 7–50)

## 2022-02-14 MED ORDER — PROSOURCE PLUS PO LIQD
30.0000 mL | Freq: Three times a day (TID) | ORAL | Status: DC
  Administered 2022-02-14 – 2022-02-18 (×10): 30 mL via ORAL
  Filled 2022-02-14 (×12): qty 30

## 2022-02-14 NOTE — Progress Notes (Addendum)
[name]   DOB:December 10, 1956   PJ#:[id number]     I have seen the patient, examined him and agree with the documentation as follows ASSESSMENT & PLAN:  Recurrent uterine cancer I have reviewed her CT imaging Lymphadenopathy is stable Continue supportive care We will delay her next treatment for 21 days from the date of COVID diagnosis Frankly, given recurrent hospitalization, it is not clear to me she can tolerate further chemotherapy I will resume discussion in the outpatient  Acquired pancytopenia Likely exacerbated by recent antibiotics and recent chemotherapy Monitor closely She was transfused recently Observe closely She does not need blood today No contraindication for her to receive anticoagulation therapy as HER platelet count is greater than 50,000  Elevated troponin, history of heart failure Could be exacerbated by recent use of trastuzumab Echocardiogram performed 02/07/2022 showed LVEF of 60 to 65% However, given her high risk for, I do not believe she is a candidate for further treatment with trastuzumab in the future  Diverticulitis Abdominal pain seems to be intermittent at this point She is not having any loose stools this morning Trying to eat more She will continue taking pain medicine and antibiotics GI following and no plans for colonoscopy at this time Continue supportive care  Severe protein calorie malnutrition She has not been eating well for the past month, in fact since last hospitalization from January She has failed outpatient IV fluid support, IV pain medicine and IV Zofran She is trying to eat more She was receiving Glucerna shakes but this may have been  exacerbating her diarrhea -has been switched to boost breeze If she does not meet dietary requirements, do not discharge She might need total parenteral nutrition 48-hour calorie count completed  Recent fall She is very debilitated Consider PT while hospitalized  COVID positivity She is  at extremely high risk due to immunocompromise state from recent chemotherapy, history of congestive heart failure and now severe pancytopenia Completed Paxlovid  Transaminitis LFTs continue to slowly climb Since CT did not show any liver abnormality or gallstones ?due to antibiotics  Discharge planning Her clinical course seems to be overall improving with the exception of rising LFTs She does not have established primary care doctor She does not qualify for home health due to lack of insurance Due to COVID positivity, I cannot bring her back to the cancer center for further supportive care for 21 days and she has failed outpatient therapy prior to admission Please do not discharge her until she is able to eat and drink reasonably normal.  Premature discharge from hospital could lead to death If she is still here next week, I will return to check on her on Monday If she is better and stable for discharge, she has appointment scheduled for 02/26/2022  Mikey Bussing, NP 02/14/2022 10:38 AM Heath Lark, MD Subjective:  Had pain last night but reports it is better today She denies having any loose stools this morning She continues to try to eat more No nausea or vomiting reported  Objective:  Vitals:   02/13/22 1515 02/13/22 2030  BP: (!) 158/96 121/75  Pulse: 92 88  Resp: 20 20  Temp: 97.7 F (36.5 C) 97.8 F (36.6 C)  SpO2: 100% 97%     Intake/Output Summary (Last 24 hours) at 02/14/2022 1038 Last data filed at 02/13/2022 1535 Gross per 24 hour  Intake 480 ml  Output --  Net 480 ml    GENERAL:alert, no distress and comfortable SKIN: skin color, texture, turgor  are normal, no rashes or significant lesions EYES: normal, Conjunctiva are pink and non-injected, sclera clear OROPHARYNX:no exudate, no erythema and lips, buccal mucosa, and tongue normal  NECK: supple, thyroid normal size, non-tender, without nodularity LYMPH:  no palpable lymphadenopathy in the cervical, axillary  or inguinal LUNGS: clear to auscultation and percussion with normal breathing effort HEART: regular rate & rhythm and no murmurs and no lower extremity edema ABDOMEN:abdomen soft, diffuse tenderness without rebound or guarding Musculoskeletal:no cyanosis of digits and no clubbing  NEURO: alert & oriented x 3 with fluent speech, no focal motor/sensory deficits   Labs:  Recent Labs    02/12/22 0340 02/13/22 1023 02/14/22 0400  NA 137 138 136  K 3.4* 3.8 3.7  CL 107 108 106  CO2 23 23 24   GLUCOSE 148* 216* 187*  BUN 7* 11 13  CREATININE 0.98 0.83 0.88  CALCIUM 8.3* 8.6* 8.7*  GFRNONAA >60 >60 >60  PROT 6.4* 6.7 6.1*  ALBUMIN 2.7* 2.8* 2.6*  AST 114* 89* 101*  ALT 98* 105* 108*  ALKPHOS 355* 340* 395*  BILITOT 0.6 0.4 0.4    Studies: I have reviewed her CT imaging DG Shoulder Right  Result Date: 02/04/2022 CLINICAL DATA:  Uterine cancer.  Diarrhea.  Right shoulder pain. EXAM: RIGHT SHOULDER - 2+ VIEW COMPARISON:  None. FINDINGS: There is diffuse decreased bone mineralization. Mild acromioclavicular joint space narrowing with mild inferior degenerative osteophytes. Mild inferior glenoid degenerative osteophytosis. No acute fracture is seen. No dislocation. Partial visualization of right chest port a catheter. 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 Electronically Signed   By: Yvonne Kendall M.D.   On: 02/04/2022 19:08   DG Abd 1 View  Result Date: 02/13/2022 CLINICAL DATA:  Abdominal pain EXAM: ABDOMEN - 1 VIEW COMPARISON:  02/09/2022 abdominal radiograph, CT abdomen and pelvis 02/04/2022 FINDINGS: Nonobstructive bowel gas pattern. No bowel dilatation or bowel wall thickening. Lung bases clear with chronic elevation of RIGHT diaphragm noted. 9 mm high attenuation curvilinear focus is identified in the RIGHT mid abdomen; this corresponds to retained contrast or calcified material within a diverticulum of the ascending colon on a recent CT. No definite  urinary tract calcifications identified. Multiple small pelvic phleboliths. IMPRESSION: No acute abnormalities. Electronically Signed   By: Lavonia Dana M.D.   On: 02/13/2022 17:30   CT Head Wo Contrast  Result Date: 02/04/2022 CLINICAL DATA:  Mental status change, unknown cause EXAM: CT HEAD WITHOUT CONTRAST TECHNIQUE: Contiguous axial images were obtained from the base of the skull through the vertex without intravenous contrast. RADIATION DOSE REDUCTION: This exam was performed according to the departmental dose-optimization program which includes automated exposure control, adjustment of the mA and/or kV according to patient size and/or use of iterative reconstruction technique. COMPARISON:  None. FINDINGS: Brain: There is no acute intracranial hemorrhage, mass effect, or edema. Gray-white differentiation is preserved. Small focus of low density in the white matter adjacent to the posterior body of the left lateral ventricle. There is no extra-axial fluid collection. Ventricles and sulci are within normal limits in size and configuration. Vascular: There is atherosclerotic calcification at the skull base. Skull: Calvarium is unremarkable. Sinuses/Orbits: No acute finding. Other: None. IMPRESSION: No acute intracranial hemorrhage. 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 Electronically Signed   By: Macy Mis M.D.   On: 02/04/2022 16:55   CT Abdomen Pelvis W Contrast  Result Date: 02/04/2022 CLINICAL DATA:  Abdominal pain.  History of  uterine cancer EXAM: CT ABDOMEN AND PELVIS WITH CONTRAST TECHNIQUE: Multidetector CT imaging of the abdomen and pelvis was performed using the standard protocol following bolus administration of intravenous contrast. RADIATION DOSE REDUCTION: This exam was performed according to the departmental dose-optimization program which includes automated exposure control, adjustment of the mA and/or kV  according to patient size and/or use of iterative reconstruction technique. CONTRAST:  144mL OMNIPAQUE IOHEXOL 300 MG/ML  SOLN COMPARISON:  01/28/2022 FINDINGS: Lower chest: Included lung bases are clear. Heart size within normal limits. Hepatobiliary: No focal liver abnormality is seen. No gallstones, gallbladder wall thickening, or biliary dilatation. Pancreas: Unremarkable. No pancreatic ductal dilatation or surrounding inflammatory changes. Spleen: Normal in size without focal abnormality. Adrenals/Urinary Tract: Unremarkable adrenal glands. Kidneys enhance symmetrically without focal lesion, stone, or hydronephrosis. Ureters are nondilated. Urinary bladder appears unremarkable. Stomach/Bowel: Colonic diverticulosis with new mild pericolonic fat stranding adjacent to a prominent diverticula at the proximal sigmoid colon where there is short segment bowel thickening (series 5, images 66-68). Scattered diverticulosis within the remaining sigmoid and descending colon. No additional sites of focal inflammation. Normal appendix in the right lower quadrant. No dilated loops of bowel. Stomach within normal limits. Vascular/Lymphatic: Scattered atherosclerotic calcifications throughout the aortoiliac axis. Stable mildly enlarged left pelvic sidewall lymph node measuring approximately 1.3 cm short axis (series 5, image 74). Reproductive: Prior hysterectomy. Stable prominence at the vaginal cuff, indeterminate. No adnexal masses. Other: Mild presacral fluid/edema. No organized abdominopelvic fluid collection. No pneumoperitoneum. Musculoskeletal: No new or acute bony findings. IMPRESSION: 1. Acute uncomplicated sigmoid diverticulitis. 2. Prior hysterectomy with stable prominence at the vaginal cuff, indeterminate. Stable mildly enlarged left pelvic sidewall lymph node. Aortic Atherosclerosis (ICD10-I70.0). Electronically Signed   By: Davina Poke D.O.   On: 02/04/2022 16:58   CT Abdomen Pelvis W Contrast  Result  Date: 01/28/2022 CLINICAL DATA:  Epigastric abdominal pain, shortness of breath, nausea vomiting, endometrial carcinoma receiving chemotherapy EXAM: CT ABDOMEN AND PELVIS WITH CONTRAST TECHNIQUE: Multidetector CT imaging of the abdomen and pelvis was performed using the standard protocol following bolus administration of intravenous contrast. RADIATION DOSE REDUCTION: This exam was performed according to the departmental dose-optimization program which includes automated exposure control, adjustment of the mA and/or kV according to patient size and/or use of iterative reconstruction technique. CONTRAST:  173mL OMNIPAQUE IOHEXOL 300 MG/ML  SOLN COMPARISON:  01/07/2022 FINDINGS: Lower chest: No acute abnormality. Hepatobiliary: No focal liver abnormality is seen. No gallstones, gallbladder wall thickening, or biliary dilatation. Pancreas: Unremarkable. No pancreatic ductal dilatation or surrounding inflammatory changes. Spleen: Normal in size without focal abnormality. Adrenals/Urinary Tract: Adrenal glands are unremarkable. Kidneys are normal, without renal calculi, focal lesion, or hydronephrosis. Bladder is unremarkable. Stomach/Bowel: Similar nonspecific thickening of the duodenal C loop may be related to under distension versus improving duodenitis when compared to 01/07/2022. Negative for bowel obstruction, significant dilatation, ileus, or free air. Normal retrocecal appendix identified. Scattered colonic diverticulosis without acute inflammatory process. No free fluid, fluid collection, hemorrhage, hematoma, abscess or ascites. Vascular/Lymphatic: Aortic atherosclerosis noted without aneurysm, dissection or occlusive process. No retroperitoneal hemorrhage or hematoma. Mesenteric and renal vasculature appears to remain patent. No veno-occlusive process. Stable left iliac pelvic sidewall mild adenopathy measuring 1.4 cm in diameter, image 70/2. No inguinal adenopathy. Reproductive: Previous hysterectomy. Stable  indeterminate soft tissue prominence of the vaginal cuff. No pelvic free fluid, fluid collection, hemorrhage or hematoma. No adnexal mass. Other: No abdominal wall hernia or abnormality. No abdominopelvic ascites. Musculoskeletal: Degenerative changes throughout the spine with lower  lumbar facet arthropathy. No acute osseous finding. IMPRESSION: No acute intra-abdominal or pelvic finding by CT. Interval improvement in the previous mild duodenitis changes by CT when compared to 01/07/2022. Colonic diverticulosis without acute inflammatory process. Normal appendix Remote hysterectomy. Stable indeterminate soft tissue prominence of the vaginal cuff and left pelvic sidewall mild adenopathy, suspicious for residual disease. Aortic Atherosclerosis (ICD10-I70.0). Electronically Signed   By: Jerilynn Mages.  Shick M.D.   On: 01/28/2022 17:41   DG CHEST PORT 1 VIEW  Result Date: 02/09/2022 CLINICAL DATA:  COVID positive EXAM: PORTABLE CHEST 1 VIEW COMPARISON:  02/04/2022 FINDINGS: Cardiac size is within normal limits. Tip of right IJ chest port is seen in the superior vena cava. Lung fields are clear of any infiltrates or pulmonary edema. There is no pleural effusion or pneumothorax. IMPRESSION: No active disease. Electronically Signed   By: Elmer Picker M.D.   On: 02/09/2022 09:31   DG Chest Port 1 View  Result Date: 02/04/2022 CLINICAL DATA:  History of uterine cancer. Diarrhea. Unable to eat for 4 days. EXAM: PORTABLE CHEST 1 VIEW COMPARISON:  January 28, 2022 FINDINGS: The heart size and mediastinal contours are stable. Right central venous line is unchanged. Both lungs are clear. The visualized skeletal structures are unremarkable. IMPRESSION: No active disease. Electronically Signed   By: Abelardo Diesel M.D.   On: 02/04/2022 14:20   DG Chest Port 1 View  Result Date: 01/28/2022 CLINICAL DATA:  Dyspnea, uterine cancer on chemotherapy EXAM: PORTABLE CHEST 1 VIEW COMPARISON:  01/04/2022 chest radiograph. FINDINGS:  Right internal jugular Port-A-Cath terminates in the lower third of the SVC. Stable cardiomediastinal silhouette with normal heart size. No pneumothorax. No pleural effusion. Lungs appear clear, with no acute consolidative airspace disease and no pulmonary edema. IMPRESSION: No active disease. Electronically Signed   By: Ilona Sorrel M.D.   On: 01/28/2022 15:18   DG Abd Portable 1V  Result Date: 02/09/2022 CLINICAL DATA:  Abdominal pain EXAM: PORTABLE ABDOMEN - 1 VIEW COMPARISON:  CT abdomen done on 02/04/2022 FINDINGS: Bowel gas pattern is nonspecific. Is no small bowel dilation. Gas and stool are present in colon. No abnormal masses are seen. There is faint 11 mm ring-like calcific density in the right mid abdomen which corresponds to a diverticulum with high density in the ascending colon seen in the previous CT. Kidneys are partly obscured by bowel contents. IMPRESSION: Nonspecific bowel gas pattern. Electronically Signed   By: Elmer Picker M.D.   On: 02/09/2022 09:30   ECHOCARDIOGRAM COMPLETE  Result Date: 02/07/2022    ECHOCARDIOGRAM REPORT   Patient Name:   [name] Date of Exam: 02/07/2022 Medical Rec #:  [id number]     Height:       62.0 in Accession #:    [id number]    Weight:       215.0 lb Date of Birth:  08/15/1956    BSA:          1.971 m Patient Age:    42 years      BP:           137/54 mmHg Patient Gender: F             HR:           73 bpm. Exam Location:  Inpatient Procedure: 2D Echo, Cardiac Doppler, Color Doppler and Strain Analysis Indications:    Elevated troponin  History:        Patient has prior history of Echocardiogram examinations, most  recent 12/25/2021. CAD and Angina; Risk Factors:Hypertension,                 Diabetes and Dyslipidemia. 06/07/2013 stent.  Sonographer:    [place] Referring Phys: [id number] Danuta Huseman [place]  1. Density in right atrium (probably related to porta cath); not as well visualized on previous study.  2. Left  ventricular ejection fraction, by estimation, is 60 to 65%. The left ventricle has normal function. The left ventricle has no regional wall motion abnormalities. There is mild left ventricular hypertrophy. Left ventricular diastolic parameters are consistent with Grade I diastolic dysfunction (impaired relaxation). Elevated left atrial pressure.  3. Right ventricular systolic function is normal. The right ventricular size is normal.  4. Left atrial size was moderately dilated.  5. The mitral valve is normal in structure. Mild mitral valve regurgitation. No evidence of mitral stenosis.  6. The aortic valve is tricuspid. Aortic valve regurgitation is not visualized. Aortic valve sclerosis is present, with no evidence of aortic valve stenosis.  7. The inferior vena cava is normal in size with greater than 50% respiratory variability, suggesting right atrial pressure of 3 mmHg. FINDINGS  Left Ventricle: Left ventricular ejection fraction, by estimation, is 60 to 65%. The left ventricle has normal function. The left ventricle has no regional wall motion abnormalities. The left ventricular internal cavity size was normal in size. There is  mild left ventricular hypertrophy. Left ventricular diastolic parameters are consistent with Grade I diastolic dysfunction (impaired relaxation). Elevated left atrial pressure. Right Ventricle: The right ventricular size is normal. Right ventricular systolic function is normal. Left Atrium: Left atrial size was moderately dilated. Right Atrium: Right atrial size was normal in size. Pericardium: There is no evidence of pericardial effusion. Mitral Valve: The mitral valve is normal in structure. Mild mitral annular calcification. Mild mitral valve regurgitation. No evidence of mitral valve stenosis. MV peak gradient, 7.6 mmHg. The mean mitral valve gradient is 3.0 mmHg. Tricuspid Valve: The tricuspid valve is normal in structure. Tricuspid valve regurgitation is trivial. No evidence of  tricuspid stenosis. Aortic Valve: The aortic valve is tricuspid. Aortic valve regurgitation is not visualized. Aortic valve sclerosis is present, with no evidence of aortic valve stenosis. Aortic valve mean gradient measures 6.0 mmHg. Aortic valve peak gradient measures 10.5 mmHg. Aortic valve area, by VTI measures 3.74 cm. Pulmonic Valve: The pulmonic valve was normal in structure. Pulmonic valve regurgitation is not visualized. No evidence of pulmonic stenosis. Aorta: The aortic root is normal in size and structure. Venous: The inferior vena cava is normal in size with greater than 50% respiratory variability, suggesting right atrial pressure of 3 mmHg. IAS/Shunts: No atrial level shunt detected by color flow Doppler. Additional Comments: Density in right atrium (probably related to porta cath); not as well visualized on previous study.  LEFT VENTRICLE PLAX 2D LVIDd:         4.10 cm   Diastology LVIDs:         2.30 cm   LV e' medial:    5.55 cm/s LV PW:         1.40 cm   LV E/e' medial:  19.5 LV IVS:        1.20 cm   LV e' lateral:   8.70 cm/s LVOT diam:     2.30 cm   LV E/e' lateral: 12.4 LV SV:         122 LV SV Index:   62 LVOT Area:  4.15 cm  RIGHT VENTRICLE RV Basal diam:  3.30 cm RV Mid diam:    2.90 cm RV S prime:     14.30 cm/s TAPSE (M-mode): 2.4 cm LEFT ATRIUM           Index        RIGHT ATRIUM           Index LA diam:      3.70 cm 1.88 cm/m   RA Area:     12.40 cm LA Vol (A4C): 87.3 ml 44.28 ml/m  RA Volume:   27.00 ml  13.70 ml/m  AORTIC VALVE                     PULMONIC VALVE AV Area (Vmax):    3.69 cm      PV Vmax:       0.99 m/s AV Area (Vmean):   3.40 cm      PV Vmean:      65.150 cm/s AV Area (VTI):     3.74 cm      PV VTI:        0.185 m AV Vmax:           162.00 cm/s   PV Peak grad:  3.9 mmHg AV Vmean:          110.000 cm/s  PV Mean grad:  2.0 mmHg AV VTI:            0.327 m AV Peak Grad:      10.5 mmHg AV Mean Grad:      6.0 mmHg LVOT Vmax:         144.00 cm/s LVOT Vmean:         90.000 cm/s LVOT VTI:          0.294 m LVOT/AV VTI ratio: 0.90  AORTA Ao Root diam: 2.60 cm Ao Asc diam:  3.10 cm MITRAL VALVE                TRICUSPID VALVE MV Area (PHT): 3.74 cm     TR Peak grad:   42.0 mmHg MV Area VTI:   3.62 cm     TR Vmax:        324.00 cm/s MV Peak grad:  7.6 mmHg MV Mean grad:  3.0 mmHg     SHUNTS MV Vmax:       1.38 m/s     Systemic VTI:  0.29 m MV Vmean:      77.0 cm/s    Systemic Diam: 2.30 cm MV Decel Time: 203 msec MR Peak grad: 109.4 mmHg MR Mean grad: 75.0 mmHg MR Vmax:      523.00 cm/s MR Vmean:     414.0 cm/s MV E velocity: 108.00 cm/s MV A velocity: 121.00 cm/s MV E/A ratio:  0.89 Kirk Ruths MD Electronically signed by Kirk Ruths MD Signature Date/Time: 02/07/2022/2:40:51 PM    Final    VAS [place] LOWER EXTREMITY VENOUS (DVT)  Result Date: 02/07/2022  Lower Venous DVT Study Patient Name:  [name]  Date of Exam:   02/07/2022 Medical Rec #: [id number]      Accession #:    [id number] Date of Birth: 1956/12/04     Patient Gender: F Patient Age:   12 years Exam Location:  [facility] Procedure:      VAS [place] LOWER EXTREMITY VENOUS (DVT) Referring Phys: PRANAV PATEL --------------------------------------------------------------------------------  Indications: Edema.  Comparison Study: 07/19/2018- negative right lower extremity venous duplex  Performing Technologist: Maudry Mayhew MHA, RDMS, RVT, RDCS  Examination Guidelines: A complete evaluation includes B-mode imaging, spectral Doppler, color Doppler, and power Doppler as needed of all accessible portions of each vessel. Bilateral testing is considered an integral part of a complete examination. Limited examinations for reoccurring indications may be performed as noted. The reflux portion of the exam is performed with the patient in reverse Trendelenburg.  +---------+---------------+---------+-----------+----------+--------------+  RIGHT     Compressibility Phasicity Spontaneity Properties Thrombus Aging   +---------+---------------+---------+-----------+----------+--------------+  CFV       Full            Yes       Yes                                    +---------+---------------+---------+-----------+----------+--------------+  SFJ       Full                                                             +---------+---------------+---------+-----------+----------+--------------+  FV Prox   Full                                                             +---------+---------------+---------+-----------+----------+--------------+  FV Mid    Full                                                             +---------+---------------+---------+-----------+----------+--------------+  FV Distal Full                                                             +---------+---------------+---------+-----------+----------+--------------+  PFV       Full                                                             +---------+---------------+---------+-----------+----------+--------------+  POP       Full            Yes       Yes                                    +---------+---------------+---------+-----------+----------+--------------+  PTV       Full                                                             +---------+---------------+---------+-----------+----------+--------------+  PERO      Full                                                             +---------+---------------+---------+-----------+----------+--------------+   +---------+---------------+---------+-----------+----------+--------------+  LEFT      Compressibility Phasicity Spontaneity Properties Thrombus Aging  +---------+---------------+---------+-----------+----------+--------------+  CFV       Full            Yes       Yes                                    +---------+---------------+---------+-----------+----------+--------------+  SFJ       Full                                                              +---------+---------------+---------+-----------+----------+--------------+  FV Prox   Full                                                             +---------+---------------+---------+-----------+----------+--------------+  FV Mid    Full                                                             +---------+---------------+---------+-----------+----------+--------------+  FV Distal Full                                                             +---------+---------------+---------+-----------+----------+--------------+  PFV       Full                                                             +---------+---------------+---------+-----------+----------+--------------+  POP       Full            Yes       Yes                                    +---------+---------------+---------+-----------+----------+--------------+  PTV       Full                                                             +---------+---------------+---------+-----------+----------+--------------+  PERO      Full                                                             +---------+---------------+---------+-----------+----------+--------------+     Summary: RIGHT: - There is no evidence of deep vein thrombosis in the lower extremity.  - No cystic structure found in the popliteal fossa.  LEFT: - There is no evidence of deep vein thrombosis in the lower extremity.  - No cystic structure found in the popliteal fossa.  *See table(s) above for measurements and observations. Electronically signed by Orlie Pollen on 02/07/2022 at 6:12:48 PM.    Final

## 2022-02-14 NOTE — Progress Notes (Addendum)
? ? ? ?  [facility] Progress Note ? ?CC:  Diarrhea, rectal pain, elevated LFTs ? ?Subjective:  Feeling better today.  No pain currently.  No BM since yesterday. ? ?Objective:  ?Vital signs in last 24 hours: ?Temp:  [97.7 ?F (36.5 ?C)-97.8 ?F (36.6 ?C)] 97.8 ?F (36.6 ?C) (03/01 2030) ?Pulse Rate:  [88-92] 88 (03/01 2030) ?Resp:  [20] 20 (03/01 2030) ?BP: (121-158)/(75-96) 121/75 (03/01 2030) ?SpO2:  [97 %-100 %] 97 % (03/01 2030) ?Last BM Date : 02/13/22 ?General:  Alert, Well-developed, in NAD ?Heart:  Regular rate and rhythm; no murmurs ?Pulm:  CTAB.  No W/R/R. ?Abdomen:  Soft, non-distended.  BS present.  Minimal lower abdominal TTP. ?Extremities:  Without edema. ?Neurologic:  Alert and oriented x 4;  grossly normal neurologically. ?Psych:  Alert and cooperative. Normal mood and affect. ? ?Intake/Output from previous day: ?03/01 0701 - 03/02 0700 ?In: 58 [P.O.:960; I.V.:10] ?Out: -  ? ?Lab Results: ?Recent Labs  ?  02/12/22 ?0340 02/14/22 ?0400  ?WBC 4.2 5.5  ?HGB 8.3* 8.0*  ?HCT 25.5* 25.3*  ?PLT 61* 55*  ? ?BMET ?Recent Labs  ?  02/12/22 ?9147 02/13/22 ?1023 02/14/22 ?0400  ?NA 137 138 136  ?K 3.4* 3.8 3.7  ?CL 107 108 106  ?CO2 23 23 24   ?GLUCOSE 148* 216* 187*  ?BUN 7* 11 13  ?CREATININE 0.98 0.83 0.88  ?CALCIUM 8.3* 8.6* 8.7*  ? ?LFT ?Recent Labs  ?  02/14/22 ?0400  ?PROT 6.1*  ?ALBUMIN 2.6*  ?AST 101*  ?ALT 108*  ?ALKPHOS 395*  ?BILITOT 0.4  ? ?PT/INR ?Recent Labs  ?  02/14/22 ?0400  ?LABPROT 14.9  ?INR 1.2  ? ? ?DG Abd 1 View ? ?Result Date: 02/13/2022 ?CLINICAL DATA:  Abdominal pain EXAM: ABDOMEN - 1 VIEW COMPARISON:  02/09/2022 abdominal radiograph, CT abdomen and pelvis 02/04/2022 FINDINGS: Nonobstructive bowel gas pattern. No bowel dilatation or bowel wall thickening. Lung bases clear with chronic elevation of RIGHT diaphragm noted. 9 mm high attenuation curvilinear focus is identified in the RIGHT mid abdomen; this corresponds to retained contrast or calcified material within a diverticulum  of the ascending colon on a recent CT. No definite urinary tract calcifications identified. Multiple small pelvic phleboliths. IMPRESSION: No acute abnormalities. Electronically Signed   By: Lavonia Dana M.D.   On: 02/13/2022 17:30   ? ?Assessment / Plan: ?1.  Elevated LFTs: LFTs stable/up slightly, thought likely Dili given recent antibiotics +/- COVID but acute hep panel is pending.  Hopefully see LFTs down-trending soon. ?2.  Acute diverticulitis: Treated/completed. ?3.  Rectal pain: Resolved. ?4.  Diarrhea: Improved today, no BM since yesterday.  Using some Imodium.  Stool studies including C. difficile are negative; likely combination of COVID and recent antibiotics.  She was drinking a lot of Glucerna so that could be causing some of her diarrhea. ?5.  Family history of colon cancer ?6.  COVID-positive, asymptomatic ?7.  Endometrial cancer ?8.  CKD stage III ?9.  Chronic diastolic heart failure ?10. Pancytopenia:  Due to chemo.  Stable overall. ? ? LOS: 9 days  ? ?Laban Emperor. Zehr  02/14/2022, 9:25 AM ? ?  ? [facility] Attending  ? ?I have also seen and evaluated the patient. She is improved re: diarrhea. Likely multifactorial as are LFT changes, though DILI likely primary cause. ? ?We will check again tomorrow but may be close to signing off. ? ?Gatha Mayer, MD, [facility] Regal ?[facility] ?02/14/2022 1:35 PM ? ? ?

## 2022-02-14 NOTE — Plan of Care (Signed)

## 2022-02-14 NOTE — Progress Notes (Signed)
?PROGRESS NOTE ? ? ?[name]  YKD:[id number] DOB: February 12, 1956 DOA: 02/04/2022 ?PCP: [name], MD  ?Brief Narrative:  ?89 blk home dwell fem ?Known history of recurrent uterine cancer stage IIIc status post TAH/BSO omentectomy selective lymph nodule removal-uterine carboplatin AUC 6/paclitaxel q. 21D/trastuzumab + XRT--ECOG status 3--- follows with Dr. Rozetta Nunnery ?HTN, HLD, reflux, DM TY 2 ?Last OV 01/31/2022 noted to be tolerating systemic chemo very poorly--also had fall X2 on 2/20+ bowel incontinence ?Presented to ED with diarrhea and CT scan showed mild diverticulitis ?Coincidently COVID-positive (2/20) and received 5 days of Paxlovid ?Initially was treated with Zosyn and then oral Augmentin as well as Omnicef/Flagyl-C. difficile never collected started on Questran ?Also had relatively elevated LFTs ?Being treated empirically for like colonic diverticulitis-GI saw the patient in consult and have deferred colonoscopy at this time because of her acute onset ? ? ?Hospital-Problem based course ? ??  Diverticulitis on admission ?Antibiotic related diarrhea ?Etiology unclear-DDx chemo XRT with resulting mucositis/diarrhea [XRT might have caused this?] ?GI pathogen panel -2/21, C. difficile -2/28 ?Imodium 3 times daily, continue cholestyramine 4 g daily, Florastor 250 twice daily, Tums daily ?cefdinir and Flagyl finished 02/14/2022 ?Does need outpatient colonoscopy- as fecal occult positive ?LFT elevation?  2/2 antibiotics ?Trend seems to be stable-bilirubin is 0.4-alpha-1 antitrypsin was 225-unclear how to interpret-defer to GI ?Recurrent uterine cancer stage IIIc ?Relative pancytopenia secondary to recent chemo, acute illness ?Appreciate Dr. Rozetta Nunnery input ?Baseline hemoglobin 10 range  ? " "  platelets 120-140 ?S/p 1 unit PRBC 2/25 ?Continue Lovenox if platelets above 50-MS IR 15 every 4 as needed severe pain ?Per Oncology ?Asymptomatic COVID-19 ?As immunocompromise patient did receive Paxlovid X 5 days ?CRP  improved ?DM TY 2 A1c 7.9 ?CBG 166-175 ?Range-continue sliding scale ?As outpatient resume Trulicity metformin glimepiride ?HFpEF grade 1 diastolic dysfunction without any exacerbation ?HTN ?Continue amlodipine 5, hydralazine as a as needed, Imdur 60, lisinopril 20, metoprolol 100 twice daily ?not volume overloaded-change Lasix to q. other day ?Obesity BMI >35 with cancer related malnutrition calorie ?count completed on 3/2-not meeting her nutritional goals at this time follow interventions per nutritionist ? ? ?DVT prophylaxis: Lovenox ?Code Status: Full ?Family Communication: None present today ?Disposition:  ?Status is: Inpatient ?Remains inpatient appropriate because:  ? ?Needs adequate PO input and nutrition --this cannot be reliably administered in the outpatient setting given her COVID-positive state and she will need to remain inpatient until her nutrition improves ? ? ? ? ?  ?Consultants:  ?Oncologist Dr. Rozetta Nunnery ?Gastroenterology Dr. Lorenso Courier ?As above ?Procedures:  ? ?Antimicrobials:   ? ? ?Subjective: ? ?A little quieter today ?No distress ?No chest pain ?No fever no chills ?Looks like she walked well with therapy and they have actually signed off as she is ambulating 100 feet without assistive ?She might need a rollator ? ?Objective: ?Vitals:  ? 02/13/22 0629 02/13/22 3976 02/13/22 1515 02/13/22 2030  ?BP: (!) 163/107 (!) 155/85 (!) 158/96 121/75  ?Pulse: 72 74 92 88  ?Resp: 18  20 20   ?Temp: 97.7 ?F (36.5 ?C)  97.7 ?F (36.5 ?C) 97.8 ?F (36.6 ?C)  ?TempSrc: Oral  Axillary Oral  ?SpO2: 99%  100% 97%  ?Weight:      ?Height:      ? ? ?Intake/Output Summary (Last 24 hours) at 02/14/2022 1501 ?Last data filed at 02/13/2022 1535 ?Gross per 24 hour  ?Intake 240 ml  ?Output --  ?Net 240 ml  ? ? ?Filed Weights  ? 02/04/22 1307  ?Weight: 97.5 kg  ? ? ?  Examination: ? ?obese black female no distress ?EOMI NCAT no focal deficit ?CTA B no added sound  ?ROM intact ?Moving all 4 limbs equally without any focal deficit no  lower extremity edema ? ?Neurologically intact ?Power 5/5 ? ?Data Reviewed: personally reviewed  ? ?CBC ?   ?Component Value Date/Time  ? WBC 5.5 02/14/2022 0400  ? RBC 2.84 (L) 02/14/2022 0400  ? HGB 8.0 (L) 02/14/2022 0400  ? HGB 9.8 (L) 01/31/2022 1230  ? HCT 25.3 (L) 02/14/2022 0400  ? PLT 55 (L) 02/14/2022 0400  ? PLT 141 (L) 01/31/2022 1230  ? MCV 89.1 02/14/2022 0400  ? MCH 28.2 02/14/2022 0400  ? MCHC 31.6 02/14/2022 0400  ? RDW 18.1 (H) 02/14/2022 0400  ? LYMPHSABS 1.3 02/14/2022 0400  ? MONOABS 0.4 02/14/2022 0400  ? EOSABS 0.0 02/14/2022 0400  ? BASOSABS 0.0 02/14/2022 0400  ? ?CMP Latest Ref Rng & Units 02/14/2022 02/13/2022 02/12/2022  ?Glucose 70 - 99 mg/dL 187(H) 216(H) 148(H)  ?BUN 8 - 23 mg/dL 13 11 7(L)  ?Creatinine 0.44 - 1.00 mg/dL 0.88 0.83 0.98  ?Sodium 135 - 145 mmol/L 136 138 137  ?Potassium 3.5 - 5.1 mmol/L 3.7 3.8 3.4(L)  ?Chloride 98 - 111 mmol/L 106 108 107  ?CO2 22 - 32 mmol/L 24 23 23   ?Calcium 8.9 - 10.3 mg/dL 8.7(L) 8.6(L) 8.3(L)  ?Total Protein 6.5 - 8.1 g/dL 6.1(L) 6.7 6.4(L)  ?Total Bilirubin 0.3 - 1.2 mg/dL 0.4 0.4 0.6  ?Alkaline Phos 38 - 126 U/L 395(H) 340(H) 355(H)  ?AST 15 - 41 U/L 101(H) 89(H) 114(H)  ?ALT 0 - 44 U/L 108(H) 105(H) 98(H)  ? ? ? ?Radiology Studies: ?DG Abd 1 View ? ?Result Date: 02/13/2022 ?CLINICAL DATA:  Abdominal pain EXAM: ABDOMEN - 1 VIEW COMPARISON:  02/09/2022 abdominal radiograph, CT abdomen and pelvis 02/04/2022 FINDINGS: Nonobstructive bowel gas pattern. No bowel dilatation or bowel wall thickening. Lung bases clear with chronic elevation of RIGHT diaphragm noted. 9 mm high attenuation curvilinear focus is identified in the RIGHT mid abdomen; this corresponds to retained contrast or calcified material within a diverticulum of the ascending colon on a recent CT. No definite urinary tract calcifications identified. Multiple small pelvic phleboliths. IMPRESSION: No acute abnormalities. Electronically Signed   By: Lavonia Dana M.D.   On: 02/13/2022 17:30    ? ? ?Scheduled Meds: ? (feeding supplement) PROSource Plus  30 mL Oral TID PC & HS  ? amLODipine  5 mg Oral Daily  ? vitamin C  500 mg Oral Daily  ? aspirin EC  81 mg Oral Daily  ? calcium carbonate  1 tablet Oral Daily  ? Chlorhexidine Gluconate Cloth  6 each Topical Daily  ? cholestyramine light  4 g Oral Q1200  ? enoxaparin (LOVENOX) injection  40 mg Subcutaneous Daily  ? feeding supplement  1 Container Oral TID BM  ? [START ON 02/15/2022] furosemide  20 mg Oral QODAY  ? hydrocortisone   Rectal BID  ? insulin aspart  0-15 Units Subcutaneous TID WC  ? isosorbide mononitrate  60 mg Oral Daily  ? lisinopril  20 mg Oral Daily  ? liver oil-zinc oxide   Topical BID  ? loperamide  2 mg Oral TID  ? metoprolol tartrate  100 mg Oral BID  ? multivitamin with minerals  1 tablet Oral Daily  ? pantoprazole  40 mg Oral Daily  ? protein supplement  1 Scoop Oral TID WC  ? saccharomyces boulardii  250 mg Oral BID  ?  sodium chloride flush  10-40 mL Intracatheter Q12H  ? ?Continuous Infusions: ? ? LOS: 9 days  ? ?Time spent: 29 ? ?Nita Sells, MD ?[facility] ?To contact the attending provider between 7A-7P or the covering provider during after hours 7P-7A, please log into the web site [email or web address] and access using universal [place] password for that web site. If you do not have the password, please call the hospital operator. ? ?02/14/2022, 3:01 PM  ? ? ?

## 2022-02-15 LAB — GLUCOSE, CAPILLARY
Glucose-Capillary: 167 mg/dL — ABNORMAL HIGH (ref 70–99)
Glucose-Capillary: 199 mg/dL — ABNORMAL HIGH (ref 70–99)
Glucose-Capillary: 191 mg/dL — ABNORMAL HIGH (ref 70–99)
Glucose-Capillary: 199 mg/dL — ABNORMAL HIGH (ref 70–99)
Glucose-Capillary: 182 mg/dL — ABNORMAL HIGH (ref 70–99)

## 2022-02-15 LAB — HEPATITIS PANEL, ACUTE
HCV Ab: NONREACTIVE
Hep A IgM: NONREACTIVE
Hep B C IgM: NONREACTIVE
Hepatitis B Surface Ag: NONREACTIVE

## 2022-02-15 LAB — HEPATIC FUNCTION PANEL
ALT: 92 U/L — ABNORMAL HIGH (ref 0–44)
AST: 51 U/L — ABNORMAL HIGH (ref 15–41)
Albumin: 2.6 g/dL — ABNORMAL LOW (ref 3.5–5.0)
Alkaline Phosphatase: 347 U/L — ABNORMAL HIGH (ref 38–126)
Bilirubin, Direct: 0.1 mg/dL (ref 0.0–0.2)
Indirect Bilirubin: 0.1 mg/dL — ABNORMAL LOW (ref 0.3–0.9)
Total Bilirubin: 0.2 mg/dL — ABNORMAL LOW (ref 0.3–1.2)
Total Protein: 5.9 g/dL — ABNORMAL LOW (ref 6.5–8.1)

## 2022-02-15 NOTE — Progress Notes (Signed)
? ? ? ?  [facility] Progress Note ? ?CC:  Diarrhea, rectal pain, elevated LFTs ? ?Subjective:  Still feeling better today.  No further diarrhea.  Using some Imodium. ? ?Objective:  ?Vital signs in last 24 hours: ?Temp:  [97.8 ?F (36.6 ?C)-98.3 ?F (36.8 ?C)] 98.3 ?F (36.8 ?C) (03/03 0425) ?Pulse Rate:  [70-72] 70 (03/03 0425) ?Resp:  [18] 18 (03/03 0425) ?BP: (128-129)/(61-64) 128/64 (03/03 0425) ?SpO2:  [97 %-98 %] 98 % (03/03 0425) ?Weight:  [97.7 kg] 97.7 kg (03/03 0425) ?Last BM Date : 02/14/22 (per pt) ?General:  Alert, Well-developed, in NAD ?Heart:  Regular rate and rhythm; no murmurs ?Pulm:  CTAB.  No W/R/R. ?Abdomen:  Soft, non-distended.  BS present.  Minimal TTP. ?Extremities:  Without edema. ?Neurologic:  Alert and oriented x 4;  grossly normal neurologically. ?Psych:  Alert and cooperative. Normal mood and affect. ? ?Intake/Output this shift: ?Total I/O ?In: 118 [P.O.:118] ?Out: -  ? ?Lab Results: ?Recent Labs  ?  02/14/22 ?0400  ?WBC 5.5  ?HGB 8.0*  ?HCT 25.3*  ?PLT 55*  ? ?BMET ?Recent Labs  ?  02/13/22 ?1023 02/14/22 ?0400  ?NA 138 136  ?K 3.8 3.7  ?CL 108 106  ?CO2 23 24  ?GLUCOSE 216* 187*  ?BUN 11 13  ?CREATININE 0.83 0.88  ?CALCIUM 8.6* 8.7*  ? ?LFT ?Recent Labs  ?  02/14/22 ?0400  ?PROT 6.1*  ?ALBUMIN 2.6*  ?AST 101*  ?ALT 108*  ?ALKPHOS 395*  ?BILITOT 0.4  ? ?PT/INR ?Recent Labs  ?  02/14/22 ?0400  ?LABPROT 14.9  ?INR 1.2  ? ?DG Abd 1 View ? ?Result Date: 02/13/2022 ?CLINICAL DATA:  Abdominal pain EXAM: ABDOMEN - 1 VIEW COMPARISON:  02/09/2022 abdominal radiograph, CT abdomen and pelvis 02/04/2022 FINDINGS: Nonobstructive bowel gas pattern. No bowel dilatation or bowel wall thickening. Lung bases clear with chronic elevation of RIGHT diaphragm noted. 9 mm high attenuation curvilinear focus is identified in the RIGHT mid abdomen; this corresponds to retained contrast or calcified material within a diverticulum of the ascending colon on a recent CT. No definite urinary tract  calcifications identified. Multiple small pelvic phleboliths. IMPRESSION: No acute abnormalities. Electronically Signed   By: Lavonia Dana M.D.   On: 02/13/2022 17:30   ? ?Assessment / Plan: ?1.  Elevated LFTs: LFTs stable/up slightly, thought likely Dili given recent antibiotics +/- COVID but acute hep panel is negative.  LFTs trending down slightly today. ?2.  Acute diverticulitis: Treated/completed. ?3.  Rectal pain: Resolved. ?4.  Diarrhea: Improved/resolved.  Using some Imodium.  Stool studies including C. difficile are negative; likely combination of COVID and recent antibiotics.  She was drinking a lot of Glucerna so that could have been causing some of her diarrhea. ?5.  Family history of colon cancer ?6.  COVID-positive, asymptomatic ?7.  Endometrial cancer ?8.  CKD stage III ?9.  Chronic diastolic heart failure ?10. Pancytopenia:  Due to chemo.  Stable overall. ? ? ? LOS: 10 days  ? ?Laban Emperor. Hadia Minier  02/15/2022, 9:46 AM ? ?  ?

## 2022-02-15 NOTE — Progress Notes (Addendum)
PROGRESS NOTE   [name]  YIR:[id number] DOB: 01/17/1956 DOA: 02/04/2022 PCP: [name], MD  Brief Narrative:  64 blk home dwell fem Known history of recurrent uterine cancer stage IIIc status post TAH/BSO omentectomy selective lymph nodule removal-uterine carboplatin AUC 6/paclitaxel q. 21D/trastuzumab + XRT--ECOG status 3--- follows with Dr. Rozetta Nunnery HTN, HLD, reflux, DM TY 2 Last OV 01/31/2022 noted to be tolerating systemic chemo very poorly--also had fall X2 on 2/20+ bowel incontinence Presented to ED with diarrhea and CT scan showed mild diverticulitis Coincidently COVID-positive (2/20) and received 5 days of Paxlovid Initially was treated with Zosyn and then oral Augmentin as well as Omnicef/Flagyl-C. difficile never collected started on Questran Also had relatively elevated LFTs Being treated empirically for like colonic diverticulitis-GI saw the patient in consult and have deferred colonoscopy at this time because of her acute onset  Calorie count was low but she is improving slowly and is eating a diet  Hospital-Problem based course  ?  Diverticulitis on admission Antibiotic related diarrhea Etiology unclear-DDx chemo XRT with resulting mucositis/diarrhea [XRT might have caused this?] GI pathogen panel -2/21, C. difficile -2/28 Imodium 3 times daily, continue cholestyramine 4 g daily, Florastor 250 twice daily, Tums daily--- diarrhea is much improved cefdinir and Flagyl finished 02/14/2022 Does need consideration for outpatient colonoscopy if goals are still aligned with curative intent-[ fecal occult positive] LFT elevation?  2/2 antibiotics Downtrending --bilirubin normalizing alk phos still elevated-alpha-1 antitrypsin was 225 GGT was elevated raising possibility that this is liver related-I wonder if she has metastases to the liver? Recurrent uterine cancer stage IIIc Relative pancytopenia secondary to recent chemo, acute illness Appreciate Dr. Rozetta Nunnery  input Baseline hemoglobin 10 range   " "  platelets 120-140 S/p 1 unit PRBC 2/25 Continue Lovenox if platelets above 50-MS IR 15 every 4 as needed severe pain check routine labs a.m. I will discontinue her aspirin prior to discharge Asymptomatic COVID-19 As immunocompromise patient did receive Paxlovid X 5 days CRP improved DM TY 2 A1c 7.9 CBG 190s Range-continue sliding scale As outpatient resume Trulicity metformin glimepiride HFpEF grade 1 diastolic dysfunction without any exacerbation HTN Continue amlodipine 5, hydralazine as a as needed, Imdur 60, lisinopril 20, metoprolol 100 twice daily not volume overloaded-change Lasix to q. other day Obesity BMI >35 with cancer related malnutrition calorie count completed on 3/2-she is eating 100% today She is not having diarrhea I think she is about as close as we can get her to reasonable nutritional status Hopefully we can discharge her a.m. 3/4   DVT prophylaxis: Lovenox Code Status: Full Family Communication:  D/W [name] [phone])  patient daughter-she voices concerns about the LFTs-she became emotional while we were talking about things and is hopeful for good outcome-I explained to her clearly that this will be discussed by her oncologist in the outpatient setting-emotional support given and discussed also with patient with regards to likelihood of probable discharge tomorrow if she is continuing to eat 100%  Disposition: 0 Status is: Inpatient Remains inpatient appropriate because:   Needs adequate PO input and nutrition --this cannot be reliably administered in the outpatient setting given her COVID-positive state and she will need to remain inpatient until her nutrition improves   Consultants:  Oncologist Dr. Rozetta Nunnery Gastroenterology Dr. Lorenso Courier As above Procedures:   Antimicrobials:     Subjective:  Seems happy that daughter is in the room Intense emotionality daughter which we navigated together-everyone  seemed a little bit more reassured that with planning we can hopefully  have a good discussion as an outpatient with her oncologist about goals of care. I shared that I am confident that in the next several days we can discharge her home with the intent to revisit her cancer issues in the outpatient setting She has not had any diarrhea today She has no abdominal pain   Objective: Vitals:   02/13/22 2030 02/14/22 2056 02/15/22 0425 02/15/22 1300  BP: 121/75 129/61 128/64 132/67  Pulse: 88 72 70 72  Resp: '20 18 18 18  ' Temp: 97.8 F (36.6 C) 97.8 F (36.6 C) 98.3 F (36.8 C) 98.1 F (36.7 C)  TempSrc: Oral Oral Oral Oral  SpO2: 97% 97% 98% 98%  Weight:   97.7 kg   Height:        Intake/Output Summary (Last 24 hours) at 02/15/2022 1549 Last data filed at 02/15/2022 0900 Gross per 24 hour  Intake 118 ml  Output --  Net 118 ml    Filed Weights   02/04/22 1307 02/15/22 0425  Weight: 97.5 kg 97.7 kg    Examination:  Obese pleasant Mallampati 4 EOMI NCAT no focal deficit no icterus Chest clear no rales rhonchi Abdomen obese nontender no rebound cannot appreciate organomegaly ROM intact Some lower extremity edema trace no gallop Psych euthymic but seems a little bit more quiet   Data Reviewed: personally reviewed   CBC    Component Value Date/Time   WBC 5.5 02/14/2022 0400   RBC 2.84 (L) 02/14/2022 0400   HGB 8.0 (L) 02/14/2022 0400   HGB 9.8 (L) 01/31/2022 1230   HCT 25.3 (L) 02/14/2022 0400   PLT 55 (L) 02/14/2022 0400   PLT 141 (L) 01/31/2022 1230   MCV 89.1 02/14/2022 0400   MCH 28.2 02/14/2022 0400   MCHC 31.6 02/14/2022 0400   RDW 18.1 (H) 02/14/2022 0400   LYMPHSABS 1.3 02/14/2022 0400   MONOABS 0.4 02/14/2022 0400   EOSABS 0.0 02/14/2022 0400   BASOSABS 0.0 02/14/2022 0400   CMP Latest Ref Rng & Units 02/15/2022 02/14/2022 02/13/2022  Glucose 70 - 99 mg/dL - 187(H) 216(H)  BUN 8 - 23 mg/dL - 13 11  Creatinine 0.44 - 1.00 mg/dL - 0.88 0.83  Sodium 135 - 145  mmol/L - 136 138  Potassium 3.5 - 5.1 mmol/L - 3.7 3.8  Chloride 98 - 111 mmol/L - 106 108  CO2 22 - 32 mmol/L - 24 23  Calcium 8.9 - 10.3 mg/dL - 8.7(L) 8.6(L)  Total Protein 6.5 - 8.1 g/dL 5.9(L) 6.1(L) 6.7  Total Bilirubin 0.3 - 1.2 mg/dL 0.2(L) 0.4 0.4  Alkaline Phos 38 - 126 U/L 347(H) 395(H) 340(H)  AST 15 - 41 U/L 51(H) 101(H) 89(H)  ALT 0 - 44 U/L 92(H) 108(H) 105(H)     Radiology Studies: DG Abd 1 View  Result Date: 02/13/2022 CLINICAL DATA:  Abdominal pain EXAM: ABDOMEN - 1 VIEW COMPARISON:  02/09/2022 abdominal radiograph, CT abdomen and pelvis 02/04/2022 FINDINGS: Nonobstructive bowel gas pattern. No bowel dilatation or bowel wall thickening. Lung bases clear with chronic elevation of RIGHT diaphragm noted. 9 mm high attenuation curvilinear focus is identified in the RIGHT mid abdomen; this corresponds to retained contrast or calcified material within a diverticulum of the ascending colon on a recent CT. No definite urinary tract calcifications identified. Multiple small pelvic phleboliths. IMPRESSION: No acute abnormalities. Electronically Signed   By: Lavonia Dana M.D.   On: 02/13/2022 17:30     Scheduled Meds:  (feeding supplement) PROSource Plus  30 mL  Oral TID PC & HS   amLODipine  5 mg Oral Daily   vitamin C  500 mg Oral Daily   aspirin EC  81 mg Oral Daily   calcium carbonate  1 tablet Oral Daily   Chlorhexidine Gluconate Cloth  6 each Topical Daily   cholestyramine light  4 g Oral Q1200   enoxaparin (LOVENOX) injection  40 mg Subcutaneous Daily   feeding supplement  1 Container Oral TID BM   furosemide  20 mg Oral QODAY   hydrocortisone   Rectal BID   insulin aspart  0-15 Units Subcutaneous TID WC   isosorbide mononitrate  60 mg Oral Daily   lisinopril  20 mg Oral Daily   liver oil-zinc oxide   Topical BID   loperamide  2 mg Oral TID   metoprolol tartrate  100 mg Oral BID   multivitamin with minerals  1 tablet Oral Daily   pantoprazole  40 mg Oral Daily    protein supplement  1 Scoop Oral TID WC   saccharomyces boulardii  250 mg Oral BID   sodium chloride flush  10-40 mL Intracatheter Q12H   Continuous Infusions:   LOS: 10 days   Time spent: 45  Prolonged in perrson care  Nita Sells, MD [facility] To contact the attending provider between 7A-7P or the covering provider during after hours 7P-7A, please log into the web site [email or web address] and access using universal [place] password for that web site. If you do not have the password, please call the hospital operator.  02/15/2022, 3:49 PM

## 2022-02-16 ENCOUNTER — Inpatient Hospital Stay (HOSPITAL_COMMUNITY): Payer: Medicaid Other

## 2022-02-16 LAB — CBC WITH DIFFERENTIAL/PLATELET
Band Neutrophils: 0 %
Basophils Absolute: 0 10*3/uL (ref 0.0–0.1)
Basophils Relative: 1 %
Eosinophils Absolute: 0 10*3/uL (ref 0.0–0.5)
Eosinophils Relative: 1 %
HCT: 23.7 % — ABNORMAL LOW (ref 36.0–46.0)
Hemoglobin: 7.7 g/dL — ABNORMAL LOW (ref 12.0–15.0)
Lymphocytes Relative: 31 %
Lymphs Abs: 1.3 10*3/uL (ref 0.7–4.0)
MCH: 28.8 pg (ref 26.0–34.0)
MCHC: 32.5 g/dL (ref 30.0–36.0)
MCV: 88.8 fL (ref 80.0–100.0)
Monocytes Absolute: 0.3 10*3/uL (ref 0.1–1.0)
Monocytes Relative: 8 %
Neutro Abs: 2.5 10*3/uL (ref 1.7–7.7)
Neutrophils Relative %: 59 %
Platelets: 63 10*3/uL — ABNORMAL LOW (ref 150–400)
RBC: 2.67 MIL/uL — ABNORMAL LOW (ref 3.87–5.11)
RDW: 18.6 % — ABNORMAL HIGH (ref 11.5–15.5)
WBC: 4.2 10*3/uL (ref 4.0–10.5)
nRBC: 0 % (ref 0.0–0.2)
nRBC: 0 /100 WBC

## 2022-02-16 LAB — COMPREHENSIVE METABOLIC PANEL
ALT: 74 U/L — ABNORMAL HIGH (ref 0–44)
AST: 36 U/L (ref 15–41)
Albumin: 2.8 g/dL — ABNORMAL LOW (ref 3.5–5.0)
Alkaline Phosphatase: 306 U/L — ABNORMAL HIGH (ref 38–126)
Anion gap: 6 (ref 5–15)
BUN: 16 mg/dL (ref 8–23)
CO2: 22 mmol/L (ref 22–32)
Calcium: 8.4 mg/dL — ABNORMAL LOW (ref 8.9–10.3)
Chloride: 106 mmol/L (ref 98–111)
Creatinine, Ser: 0.96 mg/dL (ref 0.44–1.00)
GFR, Estimated: >60 mL/min (ref >60)
Glucose, Bld: 201 mg/dL — ABNORMAL HIGH (ref 70–99)
Potassium: 3.6 mmol/L (ref 3.5–5.1)
Sodium: 134 mmol/L — ABNORMAL LOW (ref 135–145)
Total Bilirubin: 0.2 mg/dL — ABNORMAL LOW (ref 0.3–1.2)
Total Protein: 6.4 g/dL — ABNORMAL LOW (ref 6.5–8.1)

## 2022-02-16 LAB — GLUCOSE, CAPILLARY
Glucose-Capillary: 166 mg/dL — ABNORMAL HIGH (ref 70–99)
Glucose-Capillary: 155 mg/dL — ABNORMAL HIGH (ref 70–99)
Glucose-Capillary: 126 mg/dL — ABNORMAL HIGH (ref 70–99)
Glucose-Capillary: 162 mg/dL — ABNORMAL HIGH (ref 70–99)

## 2022-02-16 MED ORDER — LOPERAMIDE HCL 2 MG PO CAPS
2.0000 mg | ORAL_CAPSULE | Freq: Two times a day (BID) | ORAL | Status: DC
  Administered 2022-02-17: 2 mg via ORAL
  Filled 2022-02-16 (×3): qty 1

## 2022-02-16 NOTE — Progress Notes (Signed)
?PROGRESS NOTE ? ? ?[name]  RPZ:[id number] DOB: 26-Oct-1956 DOA: 02/04/2022 ?PCP: [name], MD  ?Brief Narrative:  ?30 blk home dwell fem ?Known history of recurrent uterine cancer stage IIIc status post TAH/BSO omentectomy selective lymph nodule removal-uterine carboplatin AUC 6/paclitaxel q. 21D/trastuzumab + XRT--ECOG status 3--- follows with Dr. Rozetta Nunnery ?HTN, HLD, reflux, DM TY 2 ?Last OV 01/31/2022 noted to be tolerating systemic chemo very poorly--also had fall X2 on 2/20+ bowel incontinence ?Presented to ED with diarrhea and CT scan showed mild diverticulitis ?Coincidently COVID-positive (2/20) and received 5 days of Paxlovid ?Initially was treated with Zosyn and then oral Augmentin as well as Omnicef/Flagyl-C. difficile never collected started on Questran ?Also had relatively elevated LFTs ?Being treated empirically for like colonic diverticulitis-GI saw the patient in consult and have deferred colonoscopy at this time because of her acute onset ? ?Calorie count was low but she is improving slowly and has had intermittent nausea vomiting and inability to eat ?Hopeful for adequate calorie count and can discharge subsequently ? ? ?Hospital-Problem based course ? ??  Diverticulitis on admission/Antibiotic related diarrhea ?Etiology unclear-DDx chemo XRT with resulting mucositis/diarrhea [XRT might have caused this?] ?GI pathogen panel -2/21, C. difficile -2/28 ?Cut back Imodium to twice a day  ?continue cholestyramine 4 g daily, Florastor 250 twice daily, Tums daily ?cefdinir and Flagyl finished 02/14/2022 ?She is having abdominal pain today and her eating is stalled in addition she is having diarrhea-get abdominal x-ray 02/16/2022-does not show any organic pathology-May need to proceed to CT scan of pain is worse tomorrow ?LFT elevation?  2/2 antibiotics ?Downtrending --bilirubin normalizing alk phos still elevated-alpha-1 antitrypsin was 225 ?GGT was elevated  2/2 liver mets ?Recurrent uterine cancer  stage IIIc ?Relative pancytopenia secondary to recent chemo, acute illness ?Appreciate Dr. Rozetta Nunnery input ?Baseline hemoglobin 10 range, currently 7.7 ? " "  platelets 120-140-currently 60s ?S/p 1 unit PRBC 2/25 ?Continue Lovenox if platelets above 50- ?MS IR 15 every 4 as needed severe pain ?Asymptomatic COVID-19 ?As immunocompromised, patient did receive Paxlovid X 5 days ?CRP improved ?DM TY 2 A1c 7.9 ?CBG 1 20-1 60 ?Range-continue sliding scale ?As outpatient resume Trulicity metformin glimepiride ?HFpEF grade 1 diastolic dysfunction without any exacerbation ?HTN ?Continue amlodipine 5, hydralazine as a as needed, Imdur 60, lisinopril 20, metoprolol 100 twice daily ?not volume overloaded-change Lasix to q. other day ?Obesity BMI >35 with cancer related malnutrition calorie ?count completed on 3/2-she is not eating again today ?See above discussion ? ? ?DVT prophylaxis: Lovenox ?Code Status: Full ?Family Communication: ? D/W [name] 3/3 [phone])  patient daughter-we will update as needed ?Disposition: 0 ?Status is: Inpatient ?Remains inpatient appropriate because:  ? ?Needs adequate PO input and nutrition --this cannot be reliably administered in the outpatient setting given her COVID-positive state and she will need to remain inpatient until her nutrition improves ? ? ?Consultants:  ?Oncologist Dr. Rozetta Nunnery ?Gastroenterology Dr. Lorenso Courier ? ?Procedures:  ? ?Antimicrobials:   ? ? ?Subjective: ? ?Loose stools from about 2 AM this morning to 8 AM semiformed ?Not hungry some lower quadrant abdominal pain ?No rebound no guarding although some nausea ?Only eating chicken soup and crackers today has not eaten solids ? ? ?Objective: ?Vitals:  ? 02/15/22 2026 02/16/22 0500 02/16/22 0559 02/16/22 1409  ?BP: (!) 146/68  (!) 170/90 130/66  ?Pulse: 74  86 66  ?Resp: _0 ?Temp: 97.8 ?F (36.6 ?C)  99.1 ?F (37.3 ?C) 98.9 ?F (37.2 ?C)  ?TempSrc: Oral  Oral  Oral  ?SpO2: 96%  100% 96%  ?Weight:  97.3 kg    ?Height:       ? ?No intake or output data in the 24 hours ending 02/16/22 1745 ? ?Filed Weights  ? 02/04/22 1307 02/15/22 0425 02/16/22 0500  ?Weight: 97.5 kg 97.7 kg 97.3 kg  ? ? ?Examination: ? ?Obese pleasant Mallampati 4 ?EOMI NCAT no focal deficit no icterus ?Chest clear no rales rhonchi ?Abdomen obese slightly tender in the lower quadrant with no rebound ?ROM intact ?Some lower extremity edema trace no gallop ?Psych euthymic but seems a little bit more quiet ? ? ?Data Reviewed: personally reviewed  ? ?CBC ?   ?Component Value Date/Time  ? WBC 4.2 02/16/2022 0327  ? RBC 2.67 (L) 02/16/2022 0327  ? HGB 7.7 (L) 02/16/2022 0327  ? HGB 9.8 (L) 01/31/2022 1230  ? HCT 23.7 (L) 02/16/2022 0327  ? PLT 63 (L) 02/16/2022 0327  ? PLT 141 (L) 01/31/2022 1230  ? MCV 88.8 02/16/2022 0327  ? MCH 28.8 02/16/2022 0327  ? MCHC 32.5 02/16/2022 0327  ? RDW 18.6 (H) 02/16/2022 0327  ? LYMPHSABS 1.3 02/16/2022 0327  ? MONOABS 0.3 02/16/2022 0327  ? EOSABS 0.0 02/16/2022 0327  ? BASOSABS 0.0 02/16/2022 0327  ? ?CMP Latest Ref Rng & Units 02/16/2022 02/15/2022 02/14/2022  ?Glucose 70 - 99 mg/dL 201(H) - 187(H)  ?BUN 8 - 23 mg/dL 16 - 13  ?Creatinine 0.44 - 1.00 mg/dL 0.96 - 0.88  ?Sodium 135 - 145 mmol/L 134(L) - 136  ?Potassium 3.5 - 5.1 mmol/L 3.6 - 3.7  ?Chloride 98 - 111 mmol/L 106 - 106  ?CO2 22 - 32 mmol/L 22 - 24  ?Calcium 8.9 - 10.3 mg/dL 8.4(L) - 8.7(L)  ?Total Protein 6.5 - 8.1 g/dL 6.4(L) 5.9(L) 6.1(L)  ?Total Bilirubin 0.3 - 1.2 mg/dL 0.2(L) 0.2(L) 0.4  ?Alkaline Phos 38 - 126 U/L 306(H) 347(H) 395(H)  ?AST 15 - 41 U/L 36 51(H) 101(H)  ?ALT 0 - 44 U/L 74(H) 92(H) 108(H)  ? ? ? ?Radiology Studies: ?DG Abd 2 Views ? ?Result Date: 02/16/2022 ?CLINICAL DATA:  Colonic diverticulitis. On chemotherapy for uterine cancer. EXAM: ABDOMEN - 2 VIEW COMPARISON:  Abdominal x-ray 02/13/2022. CT abdomen and pelvis 02/04/2022. FINDINGS: The bowel gas pattern is normal. There is no evidence of free air. Phleboliths are unchanged in the pelvis. Retained contrast  within colonic diverticulum is unchanged in the right abdomen. No acute fractures are seen. There are vascular calcifications in the pelvis. Catheters are partially visualized overlying the chest. IMPRESSION: 1. No acute abnormality. Electronically Signed   By: Ronney Asters M.D.   On: 02/16/2022 17:17   ? ? ?Scheduled Meds: ? (feeding supplement) PROSource Plus  30 mL Oral TID PC & HS  ? amLODipine  5 mg Oral Daily  ? vitamin C  500 mg Oral Daily  ? calcium carbonate  1 tablet Oral Daily  ? Chlorhexidine Gluconate Cloth  6 each Topical Daily  ? cholestyramine light  4 g Oral Q1200  ? enoxaparin (LOVENOX) injection  40 mg Subcutaneous Daily  ? feeding supplement  1 Container Oral TID BM  ? furosemide  20 mg Oral QODAY  ? hydrocortisone   Rectal BID  ? insulin aspart  0-15 Units Subcutaneous TID WC  ? isosorbide mononitrate  60 mg Oral Daily  ? lisinopril  20 mg Oral Daily  ? liver oil-zinc oxide   Topical BID  ? [START ON 02/17/2022] loperamide  2 mg  Oral BID  ? metoprolol tartrate  100 mg Oral BID  ? multivitamin with minerals  1 tablet Oral Daily  ? pantoprazole  40 mg Oral Daily  ? protein supplement  1 Scoop Oral TID WC  ? saccharomyces boulardii  250 mg Oral BID  ? sodium chloride flush  10-40 mL Intracatheter Q12H  ? ?Continuous Infusions: ? ? LOS: 11 days  ? ?Time spent: 25 ? ? ? ?Nita Sells, MD ?[facility] ?To contact the attending provider between 7A-7P or the covering provider during after hours 7P-7A, please log into the web site [email or web address] and access using universal [place] password for that web site. If you do not have the password, please call the hospital operator. ? ?02/16/2022, 5:45 PM  ? ? ?

## 2022-02-17 LAB — COMPREHENSIVE METABOLIC PANEL
ALT: 67 U/L — ABNORMAL HIGH (ref 0–44)
AST: 37 U/L (ref 15–41)
Albumin: 2.6 g/dL — ABNORMAL LOW (ref 3.5–5.0)
Alkaline Phosphatase: 313 U/L — ABNORMAL HIGH (ref 38–126)
Anion gap: 6 (ref 5–15)
BUN: 16 mg/dL (ref 8–23)
CO2: 23 mmol/L (ref 22–32)
Calcium: 8.3 mg/dL — ABNORMAL LOW (ref 8.9–10.3)
Chloride: 105 mmol/L (ref 98–111)
Creatinine, Ser: 0.87 mg/dL (ref 0.44–1.00)
GFR, Estimated: >60 mL/min (ref >60)
Glucose, Bld: 175 mg/dL — ABNORMAL HIGH (ref 70–99)
Potassium: 3.7 mmol/L (ref 3.5–5.1)
Sodium: 134 mmol/L — ABNORMAL LOW (ref 135–145)
Total Bilirubin: 0.3 mg/dL (ref 0.3–1.2)
Total Protein: 6.1 g/dL — ABNORMAL LOW (ref 6.5–8.1)

## 2022-02-17 LAB — CBC WITH DIFFERENTIAL/PLATELET
Abs Immature Granulocytes: 0.02 10*3/uL (ref 0.00–0.07)
Basophils Absolute: 0 10*3/uL (ref 0.0–0.1)
Basophils Relative: 0 %
Eosinophils Absolute: 0 10*3/uL (ref 0.0–0.5)
Eosinophils Relative: 1 %
HCT: 23.1 % — ABNORMAL LOW (ref 36.0–46.0)
Hemoglobin: 7.3 g/dL — ABNORMAL LOW (ref 12.0–15.0)
Immature Granulocytes: 1 %
Lymphocytes Relative: 42 %
Lymphs Abs: 1.6 10*3/uL (ref 0.7–4.0)
MCH: 28.5 pg (ref 26.0–34.0)
MCHC: 31.6 g/dL (ref 30.0–36.0)
MCV: 90.2 fL (ref 80.0–100.0)
Monocytes Absolute: 0.5 10*3/uL (ref 0.1–1.0)
Monocytes Relative: 14 %
Neutro Abs: 1.5 10*3/uL — ABNORMAL LOW (ref 1.7–7.7)
Neutrophils Relative %: 42 %
Platelets: 67 10*3/uL — ABNORMAL LOW (ref 150–400)
RBC: 2.56 MIL/uL — ABNORMAL LOW (ref 3.87–5.11)
RDW: 18.9 % — ABNORMAL HIGH (ref 11.5–15.5)
WBC: 3.7 10*3/uL — ABNORMAL LOW (ref 4.0–10.5)
nRBC: 0 % (ref 0.0–0.2)

## 2022-02-17 LAB — GLUCOSE, CAPILLARY
Glucose-Capillary: 176 mg/dL — ABNORMAL HIGH (ref 70–99)
Glucose-Capillary: 233 mg/dL — ABNORMAL HIGH (ref 70–99)
Glucose-Capillary: 196 mg/dL — ABNORMAL HIGH (ref 70–99)
Glucose-Capillary: 196 mg/dL — ABNORMAL HIGH (ref 70–99)

## 2022-02-17 MED ORDER — FAMOTIDINE 20 MG PO TABS
20.0000 mg | ORAL_TABLET | Freq: Once | ORAL | Status: AC
Start: 2022-02-17 — End: 2022-02-17
  Administered 2022-02-17: 20 mg via ORAL
  Filled 2022-02-17: qty 1

## 2022-02-17 NOTE — Progress Notes (Signed)
?PROGRESS NOTE ? ? ?CAYTLIN BETTER  EPP:[id number] DOB: 04/20/1956 DOA: 02/04/2022 ?PCP: [name], MD  ?Brief Narrative:  ?60 blk home dwell fem ?Known history of recurrent uterine cancer stage IIIc status post TAH/BSO omentectomy selective lymph nodule removal-uterine carboplatin AUC 6/paclitaxel q. 21D/trastuzumab + XRT--ECOG status 3--- follows with Dr. Rozetta Nunnery ?HTN, HLD, reflux, DM TY 2 ?Last OV 01/31/2022 noted to be tolerating systemic chemo very poorly--also had fall X2 on 2/20+ bowel incontinence ?Presented to ED with diarrhea and CT scan showed mild diverticulitis ?Coincidently COVID-positive (2/20) and received 5 days of Paxlovid ?Initially was treated with Zosyn and then oral Augmentin as well as Omnicef/Flagyl-C. difficile never collected started on Questran ?Also had relatively elevated LFTs ?Being treated empirically for like colonic diverticulitis-GI saw the patient in consult and have deferred colonoscopy at this time because of her acute onset ? ?Calorie count was low but she is improving slowly and has had intermittent nausea vomiting and inability to eat ?Hopeful for adequate calorie count and can discharge subsequently ? ? ?Hospital-Problem based course ? ??  Diverticulitis on admission/Antibiotic related diarrhea ?Etiology unclear-DDx chemo XRT with resulting mucositis/diarrhea [XRT might have caused this?] ?GI pathogen panel -2/21, C. difficile -2/28 ?Imodium to twice a day now ?continue cholestyramine 4 g daily, Florastor 250 twice daily, Tums daily ?cefdinir and Flagyl finished 02/14/2022 ?Axr 3/4 neg for obstruction --intermittent diarrhea now ?LFT elevation?  2/2 antibiotics ?Down-trending 1 antitrypsin was 225 ?GGT was elevated 2/2 liver mets ?Recurrent uterine cancer stage IIIc ?Relative pancytopenia secondary to recent chemo, acute illness ?Appreciate Dr. Rozetta Nunnery input ?Baseline hemoglobin 10 range, currently 7.3 ? " "  platelets 120-140-currently 60s ?Likely will transfuse in  am ?S/p 1 unit PRBC 2/25 ?Continue Lovenox if platelets above 50 ?MS IR 15 every 4 as needed severe pain ?Asymptomatic COVID-19 ?As immunocompromised, patient did receive Paxlovid X 5 days ?CRP improved ?DM TY 2 A1c 7.9 ?CBG 170-240 Range-continue sliding scale ?As outpatient resume glimepiride alone ?HFpEF grade 1 diastolic dysfunction without any exacerbation ?HTN ?Continue amlodipine 5, hydralazine as a as needed, Imdur 60, lisinopril 20, metoprolol 100 twice daily ?not volume overloaded-change Lasix to q. other day ?Obesity BMI >35 with cancer related malnutrition calorie ?count completed on 3/2 ?See above discussion ? ?DVT prophylaxis: Lovenox ?Code Status: Full ?Family Communication: ? D/W Hartley Barefoot--- [phone]) ?Disposition: 0 ?Status is: Inpatient ?Remains inpatient appropriate because:  ? ?Needs adequate PO input and nutrition --this cannot be reliably administered in the outpatient setting given her COVID-positive state and she will need to remain inpatient until her nutrition improves for 2-3 days consecutively--otherwise she will be right back ? ? ?Consultants:  ?Oncologist Dr. Rozetta Nunnery ?Gastroenterology Dr. Lorenso Courier ? ?Subjective: ? ?Eating fair ?-vacillating sympt dependant on diet ?No cp ?Overall stable today ?Answered daughters q's Re; transfusion--I think her anemia is multifactorial ? ?Objective: ?Vitals:  ? 02/16/22 1409 02/16/22 2111 02/17/22 0500 02/17/22 0702  ?BP: 130/66 111/68  (!) 153/84  ?Pulse: 66 74  70  ?Resp: '16 18  18  '$ ?Temp: 98.9 ?F (37.2 ?C) 98 ?F (36.7 ?C)  98.4 ?F (36.9 ?C)  ?TempSrc: Oral Oral  Oral  ?SpO2: 96% 100%  100%  ?Weight:   97.8 kg   ?Height:      ? ?No intake or output data in the 24 hours ending 02/17/22 0956 ? ?Filed Weights  ? 02/15/22 0425 02/16/22 0500 02/17/22 0500  ?Weight: 97.7 kg 97.3 kg 97.8 kg  ? ? ?Examination: ? ?Obese pleasant Mallampati 4 ?EOMI  NCAT no focal deficit no icterus ?Chest clear no rales rhonchi ?Abdomen obese slightly tender in the lower  quadrant with no rebound ?ROM intact ?Some lower extremity edema trace no gallop ?Psych euthymic but seems a little bit more quiet ? ? ?Data Reviewed: personally reviewed  ? ?CBC ?   ?Component Value Date/Time  ? WBC 3.7 (L) 02/17/2022 0325  ? RBC 2.56 (L) 02/17/2022 0325  ? HGB 7.3 (L) 02/17/2022 0325  ? HGB 9.8 (L) 01/31/2022 1230  ? HCT 23.1 (L) 02/17/2022 0325  ? PLT 67 (L) 02/17/2022 0325  ? PLT 141 (L) 01/31/2022 1230  ? MCV 90.2 02/17/2022 0325  ? MCH 28.5 02/17/2022 0325  ? MCHC 31.6 02/17/2022 0325  ? RDW 18.9 (H) 02/17/2022 0325  ? LYMPHSABS 1.6 02/17/2022 0325  ? MONOABS 0.5 02/17/2022 0325  ? EOSABS 0.0 02/17/2022 0325  ? BASOSABS 0.0 02/17/2022 0325  ? ?CMP Latest Ref Rng & Units 02/17/2022 02/16/2022 02/15/2022  ?Glucose 70 - 99 mg/dL 175(H) 201(H) -  ?BUN 8 - 23 mg/dL 16 16 -  ?Creatinine 0.44 - 1.00 mg/dL 0.87 0.96 -  ?Sodium 135 - 145 mmol/L 134(L) 134(L) -  ?Potassium 3.5 - 5.1 mmol/L 3.7 3.6 -  ?Chloride 98 - 111 mmol/L 105 106 -  ?CO2 22 - 32 mmol/L 23 22 -  ?Calcium 8.9 - 10.3 mg/dL 8.3(L) 8.4(L) -  ?Total Protein 6.5 - 8.1 g/dL 6.1(L) 6.4(L) 5.9(L)  ?Total Bilirubin 0.3 - 1.2 mg/dL 0.3 0.2(L) 0.2(L)  ?Alkaline Phos 38 - 126 U/L 313(H) 306(H) 347(H)  ?AST 15 - 41 U/L 37 36 51(H)  ?ALT 0 - 44 U/L 67(H) 74(H) 92(H)  ? ? ? ?Radiology Studies: ?DG Abd 2 Views ? ?Result Date: 02/16/2022 ?CLINICAL DATA:  Colonic diverticulitis. On chemotherapy for uterine cancer. EXAM: ABDOMEN - 2 VIEW COMPARISON:  Abdominal x-ray 02/13/2022. CT abdomen and pelvis 02/04/2022. FINDINGS: The bowel gas pattern is normal. There is no evidence of free air. Phleboliths are unchanged in the pelvis. Retained contrast within colonic diverticulum is unchanged in the right abdomen. No acute fractures are seen. There are vascular calcifications in the pelvis. Catheters are partially visualized overlying the chest. IMPRESSION: 1. No acute abnormality. Electronically Signed   By: Ronney Asters M.D.   On: 02/16/2022 17:17   ? ? ?Scheduled  Meds: ? (feeding supplement) PROSource Plus  30 mL Oral TID PC & HS  ? amLODipine  5 mg Oral Daily  ? vitamin C  500 mg Oral Daily  ? calcium carbonate  1 tablet Oral Daily  ? Chlorhexidine Gluconate Cloth  6 each Topical Daily  ? cholestyramine light  4 g Oral Q1200  ? enoxaparin (LOVENOX) injection  40 mg Subcutaneous Daily  ? feeding supplement  1 Container Oral TID BM  ? furosemide  20 mg Oral QODAY  ? hydrocortisone   Rectal BID  ? insulin aspart  0-15 Units Subcutaneous TID WC  ? isosorbide mononitrate  60 mg Oral Daily  ? lisinopril  20 mg Oral Daily  ? liver oil-zinc oxide   Topical BID  ? loperamide  2 mg Oral BID  ? metoprolol tartrate  100 mg Oral BID  ? multivitamin with minerals  1 tablet Oral Daily  ? pantoprazole  40 mg Oral Daily  ? protein supplement  1 Scoop Oral TID WC  ? saccharomyces boulardii  250 mg Oral BID  ? sodium chloride flush  10-40 mL Intracatheter Q12H  ? ?Continuous Infusions: ? ? LOS:  12 days  ? ?Time spent: 15 ? ?Nita Sells, MD ?[facility] ?To contact the attending provider between 7A-7P or the covering provider during after hours 7P-7A, please log into the web site [email or web address] and access using universal [place] password for that web site. If you do not have the password, please call the hospital operator. ? ?02/17/2022, 9:56 AM  ? ? ?

## 2022-02-17 NOTE — Plan of Care (Signed)

## 2022-02-18 ENCOUNTER — Other Ambulatory Visit: Payer: Self-pay | Admitting: Hematology and Oncology

## 2022-02-18 ENCOUNTER — Other Ambulatory Visit (HOSPITAL_COMMUNITY): Payer: Self-pay

## 2022-02-18 ENCOUNTER — Encounter: Payer: Self-pay | Admitting: Hematology and Oncology

## 2022-02-18 ENCOUNTER — Ambulatory Visit: Payer: MEDICAID | Admitting: Radiation Oncology

## 2022-02-18 DIAGNOSIS — D61818 Other pancytopenia: Secondary | ICD-10-CM

## 2022-02-18 LAB — CBC WITH DIFFERENTIAL/PLATELET
Abs Immature Granulocytes: 0.01 10*3/uL (ref 0.00–0.07)
Basophils Absolute: 0 10*3/uL (ref 0.0–0.1)
Basophils Relative: 0 %
Eosinophils Absolute: 0 10*3/uL (ref 0.0–0.5)
Eosinophils Relative: 1 %
HCT: 24.6 % — ABNORMAL LOW (ref 36.0–46.0)
Hemoglobin: 7.7 g/dL — ABNORMAL LOW (ref 12.0–15.0)
Immature Granulocytes: 0 %
Lymphocytes Relative: 43 %
Lymphs Abs: 1.3 10*3/uL (ref 0.7–4.0)
MCH: 27.8 pg (ref 26.0–34.0)
MCHC: 31.3 g/dL (ref 30.0–36.0)
MCV: 88.8 fL (ref 80.0–100.0)
Monocytes Absolute: 0.5 10*3/uL (ref 0.1–1.0)
Monocytes Relative: 17 %
Neutro Abs: 1.2 10*3/uL — ABNORMAL LOW (ref 1.7–7.7)
Neutrophils Relative %: 39 %
Platelets: 73 10*3/uL — ABNORMAL LOW (ref 150–400)
RBC: 2.77 MIL/uL — ABNORMAL LOW (ref 3.87–5.11)
RDW: 18.5 % — ABNORMAL HIGH (ref 11.5–15.5)
WBC: 3 10*3/uL — ABNORMAL LOW (ref 4.0–10.5)
nRBC: 0 % (ref 0.0–0.2)

## 2022-02-18 LAB — BASIC METABOLIC PANEL
Anion gap: 7 (ref 5–15)
BUN: 15 mg/dL (ref 8–23)
CO2: 24 mmol/L (ref 22–32)
Calcium: 8.6 mg/dL — ABNORMAL LOW (ref 8.9–10.3)
Chloride: 105 mmol/L (ref 98–111)
Creatinine, Ser: 0.82 mg/dL (ref 0.44–1.00)
GFR, Estimated: >60 mL/min (ref >60)
Glucose, Bld: 204 mg/dL — ABNORMAL HIGH (ref 70–99)
Potassium: 3.8 mmol/L (ref 3.5–5.1)
Sodium: 136 mmol/L (ref 135–145)

## 2022-02-18 LAB — GLUCOSE, CAPILLARY
Glucose-Capillary: 173 mg/dL — ABNORMAL HIGH (ref 70–99)
Glucose-Capillary: 230 mg/dL — ABNORMAL HIGH (ref 70–99)

## 2022-02-18 LAB — PREPARE RBC (CROSSMATCH)

## 2022-02-18 MED ORDER — FUROSEMIDE 20 MG PO TABS: 20.0000 mg | ORAL_TABLET | ORAL | Status: DC

## 2022-02-18 MED ORDER — HYDROCORTISONE (PERIANAL) 2.5 % EX CREA
TOPICAL_CREAM | Freq: Two times a day (BID) | CUTANEOUS | 0 refills | Status: DC
  Filled 2022-02-18: qty 30, 15d supply, fill #0

## 2022-02-18 MED ORDER — SODIUM CHLORIDE 0.9% IV SOLUTION
Freq: Once | INTRAVENOUS | Status: AC
Start: 2022-02-18 — End: 2022-02-18

## 2022-02-18 MED ORDER — CHOLESTYRAMINE LIGHT 4 G PO PACK
4.0000 g | PACK | Freq: Every day | ORAL | 0 refills | Status: DC
  Filled 2022-02-18: qty 30, 30d supply, fill #0

## 2022-02-18 MED ORDER — HEPARIN SOD (PORK) LOCK FLUSH 100 UNIT/ML IV SOLN
500.0000 [IU] | INTRAVENOUS | Status: AC | PRN
  Administered 2022-02-18: 500 [IU]
  Filled 2022-02-18: qty 5

## 2022-02-18 MED ORDER — SACCHAROMYCES BOULARDII 250 MG PO CAPS
250.0000 mg | ORAL_CAPSULE | Freq: Two times a day (BID) | ORAL | 0 refills | Status: DC
  Filled 2022-02-18 – 2022-03-06 (×2): qty 60, 30d supply, fill #0

## 2022-02-18 MED ORDER — LOPERAMIDE HCL 2 MG PO CAPS
2.0000 mg | ORAL_CAPSULE | Freq: Two times a day (BID) | ORAL | 0 refills | Status: DC
Start: 2022-02-18 — End: 2022-03-18
  Filled 2022-02-18: qty 30, 15d supply, fill #0

## 2022-02-18 MED ORDER — AMLODIPINE BESYLATE 5 MG PO TABS
5.0000 mg | ORAL_TABLET | Freq: Every day | ORAL | 0 refills | Status: DC
  Filled 2022-02-18: qty 30, 30d supply, fill #0

## 2022-02-18 NOTE — Discharge Summary (Signed)
Physician Discharge Summary  [name] CBJ:[id number] DOB: 04/08/1956 DOA: 02/04/2022  PCP: [name], MD  Admit date: 02/04/2022 Discharge date: 02/18/2022  Time spent: 33 minutes  Recommendations for Outpatient Follow-up:  Will need Chem-12, CBC iron level at next lab draw-Will CC oncology with regards to the same Recommend careful consideration of aspirin going forward-I have resumed it cautiously Recommend outpatient goals of care as per oncology--also may need to have colonoscopy depending on this discussion Note dosage changes of Lasix going to every other day in addition to changes of several other meds per [facility]   Discharge Diagnoses:  MAIN problem for hospitalization   Diarrhea diverticulitis anemia thrombocytopenia  Please see below for itemized issues addressed in HOpsital- refer to other progress notes for clarity if needed  Discharge Condition: fair  Diet recommendation:   Filed Weights   02/16/22 0500 02/17/22 0500 02/18/22 0454  Weight: 97.3 kg 97.8 kg 100 kg    History of present illness:  53 blk home dwell fem Known history of recurrent uterine cancer stage IIIc status post TAH/BSO omentectomy selective lymph nodule removal-uterine carboplatin AUC 6/paclitaxel q. 21D/trastuzumab + XRT--ECOG status 3--- follows with Dr. Rozetta Nunnery HTN, HLD, reflux, DM TY 2 Last OV 01/31/2022 noted to be tolerating systemic chemo very poorly--also had fall X2 on 2/20+ bowel incontinence Presented to ED with diarrhea and CT scan showed mild diverticulitis Coincidently COVID-positive (2/20) and received 5 days of Paxlovid Initially was treated with Zosyn and then oral Augmentin as well as Omnicef/Flagyl-C. difficile never collected started on Questran Also had relatively elevated LFTs Being treated empirically for like colonic diverticulitis-GI saw the patient in consult and have deferred colonoscopy at this time because of her acute onset   Calorie count was low but she is  improving slowly and has had intermittent nausea vomiting and inability to eat Hopeful for adequate calorie count and can discharge subsequently    Hospital Course:  ?  Diverticulitis on admission/Antibiotic related diarrhea Etiology unclear-DDx chemo XRT with resulting mucositis/diarrhea [XRT might have caused this?] GI pathogen panel -2/21, C. difficile -2/28 Imodium to twice a day now and will continue in the outpatient setting continue cholestyramine 4 g daily, Florastor 250 twice daily, Tums daily cefdinir and Flagyl finished 02/14/2022 Axr 3/4 neg for obstruction --intermittent diarrhea now GI saw the patient during hospital stay--she was Hemoccult positive and at some point patient will need to have a colonoscopy although this may not be in alignment withgoals of care-in addition patient was seen for the below LFT elevation?  2/2 antibiotics Down-trending 1 antitrypsin was 225 GGT was elevated 2/2 liver mets Recurrent uterine cancer stage IIIc Relative pancytopenia secondary to recent chemo, acute illness Hemoglobin in the 10 range and platelets in the 120 range-did need transfusion S/p 1 unit PRBC 2/25 status posttransfusion again 3/6 Continue Lovenox if platelets above 50 MS IR 15 every 4 as needed severe pain Asymptomatic COVID-19 As immunocompromised, patient did receive Paxlovid X 5 days CRP improved Does not require isolation when outside of the hospital DM TY 2 A1c 7.9 CBG 170-240 Range-continue sliding scale As outpatient resume glimepiride alone HFpEF grade 1 diastolic dysfunction without any exacerbation Prior CAD with stenting 2014/2019 HTN Continue amlodipine 5, hydralazine as a as needed, Imdur 60, lisinopril 20, metoprolol 100 twice daily Will resume cautiously aspirin 81-I have discussed clearly with family and patient that this may need to be discontinued in the outpatient not volume overloaded-change Lasix to q. other day Obesity BMI >35 with  cancer related  malnutrition calorie count completed on 3/2 See above discussion  Procedures:  Consultations: Gastroenterology Oncology  Discharge Exam: Vitals:   02/18/22 0845 02/18/22 1104  BP: (!) 145/63 (!) 146/74  Pulse: (!) 105 65  Resp: 16 16  Temp: 97.8 F (36.6 C) 97.8 F (36.6 C)  SpO2: 100% 100%   Awake alert coherent eating drinking No diarrhea X 2 days No nausea no vomiting no chest pain  General Exam on discharge  Awake coherent no distress EOMI NCAT no focal deficit CTA B no rales no rhonchi No wheeze No rales no rhonchi Abdomen soft no rebound or guarding Trace lower extremity edema Neurologically intact  Discharge Instructions   Discharge Instructions     Diet - low sodium heart healthy   Complete by: As directed    Discharge instructions   Complete by: As directed    Please follow-up in the outpatient setting with oncology-we will order home health to follow you at home to assist-I would suggest that you resume your aspirin but if you have any signs or symptoms of bleeding stop it immediately and let your oncologist know Continue to eat as best as you can-Dr. Rozetta Nunnery will set up an outpatient appointment to discuss goals of care with you in the near future, I would defer further labs/work-up to her in addition to further discussions about the plan of care Continue all of the new medications that we have prescribed for your diarrhea-we did not think it was infectious I would not focus on your liver enzymes-I think that this was all related to either your chemo or your underlying infection Best of luck, have a nice spring   Increase activity slowly   Complete by: As directed       Allergies as of 02/18/2022   No Known Allergies      Medication List     STOP taking these medications    dexamethasone 4 MG tablet Commonly known as: DECADRON       TAKE these medications    amLODipine 5 MG tablet Commonly known as: NORVASC Take 1 tablet by mouth  daily. Start taking on: February 19, 2022   aspirin 81 MG EC tablet Take 1 tablet (81 mg total) by mouth daily.   atorvastatin 80 MG tablet Commonly known as: LIPITOR TAKE 1 TABLET (80 MG TOTAL) BY MOUTH AT BEDTIME.   calcium carbonate 500 MG chewable tablet Commonly known as: TUMS - dosed in mg elemental calcium Chew 1 tablet by mouth daily.   cholestyramine light 4 g packet Commonly known as: PREVALITE Dissolve in water as directed then take 1 packet by mouth daily at 12 noon.   docusate sodium 100 MG capsule Commonly known as: COLACE Take 100 mg by mouth daily as needed for mild constipation.   furosemide 20 MG tablet Commonly known as: LASIX Take 1 tablet (20 mg total) by mouth every other day. Start taking on: February 19, 2022 What changed:  how much to take when to take this   glimepiride 4 MG tablet Commonly known as: AMARYL Take 1 tablet (4 mg total) by mouth daily with breakfast.   hydrocortisone 2.5 % rectal cream Commonly known as: ANUSOL-HC Place rectally as directed 2 times daily.   isosorbide mononitrate 30 MG 24 hr tablet Commonly known as: IMDUR Take 2 tablets (60 mg total) by mouth daily.   lisinopril 20 MG tablet Commonly known as: ZESTRIL TAKE 1 TABLET BY MOUTH ONCE A DAY   liver  oil-zinc oxide 40 % ointment Commonly known as: DESITIN Apply 1 application topically as needed for irritation.   loperamide 2 MG capsule Commonly known as: IMODIUM Take 1 capsule by mouth 2 times daily.   metFORMIN 500 MG tablet Commonly known as: GLUCOPHAGE Take 2 tablets (1,000 mg total) by mouth 2 (two) times daily with a meal.   metoprolol tartrate 100 MG tablet Commonly known as: LOPRESSOR TAKE 1 TABLET (100 MG TOTAL) BY MOUTH 2 (TWO) TIMES DAILY.   morphine 15 MG tablet Commonly known as: MSIR Take 1 tablet (15 mg total) by mouth every 4 (four) hours as needed for severe pain.   nitroGLYCERIN 0.4 MG SL tablet Commonly known as: NITROSTAT Place 1 tablet  (0.4 mg total) under the tongue every 5 (five) minutes as needed for chest pain.   ondansetron 8 MG tablet Commonly known as: Zofran Take 1 tablet by mouth 2  times daily as needed for refractory nausea / vomiting. Start on day 3 after carboplatin chemo.   pantoprazole 40 MG tablet Commonly known as: PROTONIX Take 1 tablet (40 mg total) by mouth daily.   prochlorperazine 10 MG tablet Commonly known as: COMPAZINE Take 1 tablet by mouth every 6  hours as needed (Nausea or vomiting).   saccharomyces boulardii 250 MG capsule Commonly known as: FLORASTOR Take 1 capsule (250 mg total) by mouth 2 (two) times daily.   Trulicity 1.5 JE/5.6DJ Sopn Generic drug: Dulaglutide Inject 1.5 mg into the skin once a week. What changed: additional instructions       No Known Allergies    The results of significant diagnostics from this hospitalization (including imaging, microbiology, ancillary and laboratory) are listed below for reference.    Significant Diagnostic Studies: DG Shoulder Right  Result Date: 02/04/2022 CLINICAL DATA:  Uterine cancer.  Diarrhea.  Right shoulder pain. EXAM: RIGHT SHOULDER - 2+ VIEW COMPARISON:  None. FINDINGS: There is diffuse decreased bone mineralization. Mild acromioclavicular joint space narrowing with mild inferior degenerative osteophytes. Mild inferior glenoid degenerative osteophytosis. No acute fracture is seen. No dislocation. Partial visualization of right chest port a catheter. 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 Electronically Signed   By: Yvonne Kendall M.D.   On: 02/04/2022 19:08   DG Abd 1 View  Result Date: 02/13/2022 CLINICAL DATA:  Abdominal pain EXAM: ABDOMEN - 1 VIEW COMPARISON:  02/09/2022 abdominal radiograph, CT abdomen and pelvis 02/04/2022 FINDINGS: Nonobstructive bowel gas pattern. No bowel dilatation or bowel wall thickening. Lung bases clear with chronic elevation of RIGHT diaphragm noted. 9 mm high  attenuation curvilinear focus is identified in the RIGHT mid abdomen; this corresponds to retained contrast or calcified material within a diverticulum of the ascending colon on a recent CT. No definite urinary tract calcifications identified. Multiple small pelvic phleboliths. IMPRESSION: No acute abnormalities. Electronically Signed   By: Lavonia Dana M.D.   On: 02/13/2022 17:30   CT Head Wo Contrast  Result Date: 02/04/2022 CLINICAL DATA:  Mental status change, unknown cause EXAM: CT HEAD WITHOUT CONTRAST TECHNIQUE: Contiguous axial images were obtained from the base of the skull through the vertex without intravenous contrast. RADIATION DOSE REDUCTION: This exam was performed according to the departmental dose-optimization program which includes automated exposure control, adjustment of the mA and/or kV according to patient size and/or use of iterative reconstruction technique. COMPARISON:  None. FINDINGS: Brain: There is no acute intracranial hemorrhage, mass effect, or edema. Gray-white differentiation is preserved. Small focus of low density in the white matter adjacent  to the posterior body of the left lateral ventricle. There is no extra-axial fluid collection. Ventricles and sulci are within normal limits in size and configuration. Vascular: There is atherosclerotic calcification at the skull base. Skull: Calvarium is unremarkable. Sinuses/Orbits: No acute finding. Other: None. IMPRESSION: No acute intracranial hemorrhage. 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 Electronically Signed   By: Macy Mis M.D.   On: 02/04/2022 16:55   CT Abdomen Pelvis W Contrast  Result Date: 02/04/2022 CLINICAL DATA:  Abdominal pain.  History of uterine cancer EXAM: CT ABDOMEN AND PELVIS WITH CONTRAST TECHNIQUE: Multidetector CT imaging of the abdomen and pelvis was performed using the standard protocol following bolus administration of  intravenous contrast. RADIATION DOSE REDUCTION: This exam was performed according to the departmental dose-optimization program which includes automated exposure control, adjustment of the mA and/or kV according to patient size and/or use of iterative reconstruction technique. CONTRAST:  115m OMNIPAQUE IOHEXOL 300 MG/ML  SOLN COMPARISON:  01/28/2022 FINDINGS: Lower chest: Included lung bases are clear. Heart size within normal limits. Hepatobiliary: No focal liver abnormality is seen. No gallstones, gallbladder wall thickening, or biliary dilatation. Pancreas: Unremarkable. No pancreatic ductal dilatation or surrounding inflammatory changes. Spleen: Normal in size without focal abnormality. Adrenals/Urinary Tract: Unremarkable adrenal glands. Kidneys enhance symmetrically without focal lesion, stone, or hydronephrosis. Ureters are nondilated. Urinary bladder appears unremarkable. Stomach/Bowel: Colonic diverticulosis with new mild pericolonic fat stranding adjacent to a prominent diverticula at the proximal sigmoid colon where there is short segment bowel thickening (series 5, images 66-68). Scattered diverticulosis within the remaining sigmoid and descending colon. No additional sites of focal inflammation. Normal appendix in the right lower quadrant. No dilated loops of bowel. Stomach within normal limits. Vascular/Lymphatic: Scattered atherosclerotic calcifications throughout the aortoiliac axis. Stable mildly enlarged left pelvic sidewall lymph node measuring approximately 1.3 cm short axis (series 5, image 74). Reproductive: Prior hysterectomy. Stable prominence at the vaginal cuff, indeterminate. No adnexal masses. Other: Mild presacral fluid/edema. No organized abdominopelvic fluid collection. No pneumoperitoneum. Musculoskeletal: No new or acute bony findings. IMPRESSION: 1. Acute uncomplicated sigmoid diverticulitis. 2. Prior hysterectomy with stable prominence at the vaginal cuff, indeterminate. Stable  mildly enlarged left pelvic sidewall lymph node. Aortic Atherosclerosis (ICD10-I70.0). Electronically Signed   By: NDavina PokeD.O.   On: 02/04/2022 16:58   CT Abdomen Pelvis W Contrast  Result Date: 01/28/2022 CLINICAL DATA:  Epigastric abdominal pain, shortness of breath, nausea vomiting, endometrial carcinoma receiving chemotherapy EXAM: CT ABDOMEN AND PELVIS WITH CONTRAST TECHNIQUE: Multidetector CT imaging of the abdomen and pelvis was performed using the standard protocol following bolus administration of intravenous contrast. RADIATION DOSE REDUCTION: This exam was performed according to the departmental dose-optimization program which includes automated exposure control, adjustment of the mA and/or kV according to patient size and/or use of iterative reconstruction technique. CONTRAST:  1074mOMNIPAQUE IOHEXOL 300 MG/ML  SOLN COMPARISON:  01/07/2022 FINDINGS: Lower chest: No acute abnormality. Hepatobiliary: No focal liver abnormality is seen. No gallstones, gallbladder wall thickening, or biliary dilatation. Pancreas: Unremarkable. No pancreatic ductal dilatation or surrounding inflammatory changes. Spleen: Normal in size without focal abnormality. Adrenals/Urinary Tract: Adrenal glands are unremarkable. Kidneys are normal, without renal calculi, focal lesion, or hydronephrosis. Bladder is unremarkable. Stomach/Bowel: Similar nonspecific thickening of the duodenal C loop may be related to under distension versus improving duodenitis when compared to 01/07/2022. Negative for bowel obstruction, significant dilatation, ileus, or free air. Normal retrocecal appendix identified. Scattered colonic diverticulosis without acute  inflammatory process. No free fluid, fluid collection, hemorrhage, hematoma, abscess or ascites. Vascular/Lymphatic: Aortic atherosclerosis noted without aneurysm, dissection or occlusive process. No retroperitoneal hemorrhage or hematoma. Mesenteric and renal vasculature appears to  remain patent. No veno-occlusive process. Stable left iliac pelvic sidewall mild adenopathy measuring 1.4 cm in diameter, image 70/2. No inguinal adenopathy. Reproductive: Previous hysterectomy. Stable indeterminate soft tissue prominence of the vaginal cuff. No pelvic free fluid, fluid collection, hemorrhage or hematoma. No adnexal mass. Other: No abdominal wall hernia or abnormality. No abdominopelvic ascites. Musculoskeletal: Degenerative changes throughout the spine with lower lumbar facet arthropathy. No acute osseous finding. IMPRESSION: No acute intra-abdominal or pelvic finding by CT. Interval improvement in the previous mild duodenitis changes by CT when compared to 01/07/2022. Colonic diverticulosis without acute inflammatory process. Normal appendix Remote hysterectomy. Stable indeterminate soft tissue prominence of the vaginal cuff and left pelvic sidewall mild adenopathy, suspicious for residual disease. Aortic Atherosclerosis (ICD10-I70.0). Electronically Signed   By: Jerilynn Mages.  Shick M.D.   On: 01/28/2022 17:41   DG CHEST PORT 1 VIEW  Result Date: 02/09/2022 CLINICAL DATA:  COVID positive EXAM: PORTABLE CHEST 1 VIEW COMPARISON:  02/04/2022 FINDINGS: Cardiac size is within normal limits. Tip of right IJ chest port is seen in the superior vena cava. Lung fields are clear of any infiltrates or pulmonary edema. There is no pleural effusion or pneumothorax. IMPRESSION: No active disease. Electronically Signed   By: Elmer Picker M.D.   On: 02/09/2022 09:31   DG Chest Port 1 View  Result Date: 02/04/2022 CLINICAL DATA:  History of uterine cancer. Diarrhea. Unable to eat for 4 days. EXAM: PORTABLE CHEST 1 VIEW COMPARISON:  January 28, 2022 FINDINGS: The heart size and mediastinal contours are stable. Right central venous line is unchanged. Both lungs are clear. The visualized skeletal structures are unremarkable. IMPRESSION: No active disease. Electronically Signed   By: Abelardo Diesel M.D.   On:  02/04/2022 14:20   DG Chest Port 1 View  Result Date: 01/28/2022 CLINICAL DATA:  Dyspnea, uterine cancer on chemotherapy EXAM: PORTABLE CHEST 1 VIEW COMPARISON:  01/04/2022 chest radiograph. FINDINGS: Right internal jugular Port-A-Cath terminates in the lower third of the SVC. Stable cardiomediastinal silhouette with normal heart size. No pneumothorax. No pleural effusion. Lungs appear clear, with no acute consolidative airspace disease and no pulmonary edema. IMPRESSION: No active disease. Electronically Signed   By: Ilona Sorrel M.D.   On: 01/28/2022 15:18   DG Abd 2 Views  Result Date: 02/16/2022 CLINICAL DATA:  Colonic diverticulitis. On chemotherapy for uterine cancer. EXAM: ABDOMEN - 2 VIEW COMPARISON:  Abdominal x-ray 02/13/2022. CT abdomen and pelvis 02/04/2022. FINDINGS: The bowel gas pattern is normal. There is no evidence of free air. Phleboliths are unchanged in the pelvis. Retained contrast within colonic diverticulum is unchanged in the right abdomen. No acute fractures are seen. There are vascular calcifications in the pelvis. Catheters are partially visualized overlying the chest. IMPRESSION: 1. No acute abnormality. Electronically Signed   By: Ronney Asters M.D.   On: 02/16/2022 17:17   DG Abd Portable 1V  Result Date: 02/09/2022 CLINICAL DATA:  Abdominal pain EXAM: PORTABLE ABDOMEN - 1 VIEW COMPARISON:  CT abdomen done on 02/04/2022 FINDINGS: Bowel gas pattern is nonspecific. Is no small bowel dilation. Gas and stool are present in colon. No abnormal masses are seen. There is faint 11 mm ring-like calcific density in the right mid abdomen which corresponds to a diverticulum with high density in the ascending colon seen in the  previous CT. Kidneys are partly obscured by bowel contents. IMPRESSION: Nonspecific bowel gas pattern. Electronically Signed   By: Elmer Picker M.D.   On: 02/09/2022 09:30   ECHOCARDIOGRAM COMPLETE  Result Date: 02/07/2022    ECHOCARDIOGRAM REPORT    Patient Name:   SEIRA [name] Date of Exam: 02/07/2022 Medical Rec #:  [id number]     Height:       62.0 in Accession #:    [id number]    Weight:       215.0 lb Date of Birth:  02-03-1956    BSA:          1.971 m Patient Age:    10 years      BP:           137/54 mmHg Patient Gender: F             HR:           73 bpm. Exam Location:  Inpatient Procedure: 2D Echo, Cardiac Doppler, Color Doppler and Strain Analysis Indications:    Elevated troponin  History:        Patient has prior history of Echocardiogram examinations, most                 recent 12/25/2021. CAD and Angina; Risk Factors:Hypertension,                 Diabetes and Dyslipidemia. 06/07/2013 stent.  Sonographer:    [place] Referring Phys: [id number] NI Stickney  1. Density in right atrium (probably related to porta cath); not as well visualized on previous study.  2. Left ventricular ejection fraction, by estimation, is 60 to 65%. The left ventricle has normal function. The left ventricle has no regional wall motion abnormalities. There is mild left ventricular hypertrophy. Left ventricular diastolic parameters are consistent with Grade I diastolic dysfunction (impaired relaxation). Elevated left atrial pressure.  3. Right ventricular systolic function is normal. The right ventricular size is normal.  4. Left atrial size was moderately dilated.  5. The mitral valve is normal in structure. Mild mitral valve regurgitation. No evidence of mitral stenosis.  6. The aortic valve is tricuspid. Aortic valve regurgitation is not visualized. Aortic valve sclerosis is present, with no evidence of aortic valve stenosis.  7. The inferior vena cava is normal in size with greater than 50% respiratory variability, suggesting right atrial pressure of 3 mmHg. FINDINGS  Left Ventricle: Left ventricular ejection fraction, by estimation, is 60 to 65%. The left ventricle has normal function. The left ventricle has no regional wall motion abnormalities. The  left ventricular internal cavity size was normal in size. There is  mild left ventricular hypertrophy. Left ventricular diastolic parameters are consistent with Grade I diastolic dysfunction (impaired relaxation). Elevated left atrial pressure. Right Ventricle: The right ventricular size is normal. Right ventricular systolic function is normal. Left Atrium: Left atrial size was moderately dilated. Right Atrium: Right atrial size was normal in size. Pericardium: There is no evidence of pericardial effusion. Mitral Valve: The mitral valve is normal in structure. Mild mitral annular calcification. Mild mitral valve regurgitation. No evidence of mitral valve stenosis. MV peak gradient, 7.6 mmHg. The mean mitral valve gradient is 3.0 mmHg. Tricuspid Valve: The tricuspid valve is normal in structure. Tricuspid valve regurgitation is trivial. No evidence of tricuspid stenosis. Aortic Valve: The aortic valve is tricuspid. Aortic valve regurgitation is not visualized. Aortic valve sclerosis is present, with no evidence of aortic valve stenosis. Aortic valve  mean gradient measures 6.0 mmHg. Aortic valve peak gradient measures 10.5 mmHg. Aortic valve area, by VTI measures 3.74 cm. Pulmonic Valve: The pulmonic valve was normal in structure. Pulmonic valve regurgitation is not visualized. No evidence of pulmonic stenosis. Aorta: The aortic root is normal in size and structure. Venous: The inferior vena cava is normal in size with greater than 50% respiratory variability, suggesting right atrial pressure of 3 mmHg. IAS/Shunts: No atrial level shunt detected by color flow Doppler. Additional Comments: Density in right atrium (probably related to porta cath); not as well visualized on previous study.  LEFT VENTRICLE PLAX 2D LVIDd:         4.10 cm   Diastology LVIDs:         2.30 cm   LV e' medial:    5.55 cm/s LV PW:         1.40 cm   LV E/e' medial:  19.5 LV IVS:        1.20 cm   LV e' lateral:   8.70 cm/s LVOT diam:     2.30 cm    LV E/e' lateral: 12.4 LV SV:         122 LV SV Index:   62 LVOT Area:     4.15 cm  RIGHT VENTRICLE RV Basal diam:  3.30 cm RV Mid diam:    2.90 cm RV S prime:     14.30 cm/s TAPSE (M-mode): 2.4 cm LEFT ATRIUM           Index        RIGHT ATRIUM           Index LA diam:      3.70 cm 1.88 cm/m   RA Area:     12.40 cm LA Vol (A4C): 87.3 ml 44.28 ml/m  RA Volume:   27.00 ml  13.70 ml/m  AORTIC VALVE                     PULMONIC VALVE AV Area (Vmax):    3.69 cm      PV Vmax:       0.99 m/s AV Area (Vmean):   3.40 cm      PV Vmean:      65.150 cm/s AV Area (VTI):     3.74 cm      PV VTI:        0.185 m AV Vmax:           162.00 cm/s   PV Peak grad:  3.9 mmHg AV Vmean:          110.000 cm/s  PV Mean grad:  2.0 mmHg AV VTI:            0.327 m AV Peak Grad:      10.5 mmHg AV Mean Grad:      6.0 mmHg LVOT Vmax:         144.00 cm/s LVOT Vmean:        90.000 cm/s LVOT VTI:          0.294 m LVOT/AV VTI ratio: 0.90  AORTA Ao Root diam: 2.60 cm Ao Asc diam:  3.10 cm MITRAL VALVE                TRICUSPID VALVE MV Area (PHT): 3.74 cm     TR Peak grad:   42.0 mmHg MV Area VTI:   3.62 cm     TR Vmax:        324.00  cm/s MV Peak grad:  7.6 mmHg MV Mean grad:  3.0 mmHg     SHUNTS MV Vmax:       1.38 m/s     Systemic VTI:  0.29 m MV Vmean:      77.0 cm/s    Systemic Diam: 2.30 cm MV Decel Time: 203 msec MR Peak grad: 109.4 mmHg MR Mean grad: 75.0 mmHg MR Vmax:      523.00 cm/s MR Vmean:     414.0 cm/s MV E velocity: 108.00 cm/s MV A velocity: 121.00 cm/s MV E/A ratio:  0.89 Kirk Ruths MD Electronically signed by Kirk Ruths MD Signature Date/Time: 02/07/2022/2:40:51 PM    Final    VAS [place] LOWER EXTREMITY VENOUS (DVT)  Result Date: 02/07/2022  Lower Venous DVT Study Patient Name:  WINSLOW VERRILL  Date of Exam:   02/07/2022 Medical Rec #: [id number]      Accession #:    [id number] Date of Birth: 04-19-1956     Patient Gender: F Patient Age:   66 years Exam Location:  [facility] Procedure:      VAS [place] LOWER  EXTREMITY VENOUS (DVT) Referring Phys: PRANAV PATEL --------------------------------------------------------------------------------  Indications: Edema.  Comparison Study: 07/19/2018- negative right lower extremity venous duplex Performing Technologist: Maudry Mayhew MHA, RDMS, RVT, RDCS  Examination Guidelines: A complete evaluation includes B-mode imaging, spectral Doppler, color Doppler, and power Doppler as needed of all accessible portions of each vessel. Bilateral testing is considered an integral part of a complete examination. Limited examinations for reoccurring indications may be performed as noted. The reflux portion of the exam is performed with the patient in reverse Trendelenburg.  +---------+---------------+---------+-----------+----------+--------------+  RIGHT     Compressibility Phasicity Spontaneity Properties Thrombus Aging  +---------+---------------+---------+-----------+----------+--------------+  CFV       Full            Yes       Yes                                    +---------+---------------+---------+-----------+----------+--------------+  SFJ       Full                                                             +---------+---------------+---------+-----------+----------+--------------+  FV Prox   Full                                                             +---------+---------------+---------+-----------+----------+--------------+  FV Mid    Full                                                             +---------+---------------+---------+-----------+----------+--------------+  FV Distal Full                                                             +---------+---------------+---------+-----------+----------+--------------+  PFV       Full                                                             +---------+---------------+---------+-----------+----------+--------------+  POP       Full            Yes       Yes                                     +---------+---------------+---------+-----------+----------+--------------+  PTV       Full                                                             +---------+---------------+---------+-----------+----------+--------------+  PERO      Full                                                             +---------+---------------+---------+-----------+----------+--------------+   +---------+---------------+---------+-----------+----------+--------------+  LEFT      Compressibility Phasicity Spontaneity Properties Thrombus Aging  +---------+---------------+---------+-----------+----------+--------------+  CFV       Full            Yes       Yes                                    +---------+---------------+---------+-----------+----------+--------------+  SFJ       Full                                                             +---------+---------------+---------+-----------+----------+--------------+  FV Prox   Full                                                             +---------+---------------+---------+-----------+----------+--------------+  FV Mid    Full                                                             +---------+---------------+---------+-----------+----------+--------------+  FV Distal Full                                                             +---------+---------------+---------+-----------+----------+--------------+  PFV       Full                                                             +---------+---------------+---------+-----------+----------+--------------+  POP       Full            Yes       Yes                                    +---------+---------------+---------+-----------+----------+--------------+  PTV       Full                                                             +---------+---------------+---------+-----------+----------+--------------+  PERO      Full                                                              +---------+---------------+---------+-----------+----------+--------------+     Summary: RIGHT: - There is no evidence of deep vein thrombosis in the lower extremity.  - No cystic structure found in the popliteal fossa.  LEFT: - There is no evidence of deep vein thrombosis in the lower extremity.  - No cystic structure found in the popliteal fossa.  *See table(s) above for measurements and observations. Electronically signed by Orlie Pollen on 02/07/2022 at 6:12:48 PM.    Final     Microbiology: Recent Results (from the past 240 hour(s))  C Difficile Quick Screen (NO PCR Reflex)     Status: None   Collection Time: 02/12/22  5:40 AM   Specimen: STOOL  Result Value Ref Range Status   C Diff antigen NEGATIVE NEGATIVE Final   C Diff toxin NEGATIVE NEGATIVE Final   C Diff interpretation No C. difficile detected.  Final    Comment: Performed at [facility], [place] [address], [place], [place] [address]     Labs: Basic Metabolic Panel: Recent Labs  Lab 02/12/22 0340 02/13/22 1023 02/14/22 0400 02/16/22 0327 02/17/22 0325 02/18/22 0335  NA 137 138 136 134* 134* 136  K 3.4* 3.8 3.7 3.6 3.7 3.8  CL 107 108 106 106 105 105  CO2 '23 23 24 22 23 24  '$ GLUCOSE 148* 216* 187* 201* 175* 204*  BUN 7* '11 13 16 16 15  '$ CREATININE 0.98 0.83 0.88 0.96 0.87 0.82  CALCIUM 8.3* 8.6* 8.7* 8.4* 8.3* 8.6*  MG 1.8  --   --   --   --   --    Liver Function Tests: Recent Labs  Lab 02/13/22 1023 02/14/22 0400 02/15/22 0910 02/16/22 0327 02/17/22 0325  AST 89* 101* 51* 36 37  ALT 105* 108* 92* 74* 67*  ALKPHOS 340* 395* 347* 306* 313*  BILITOT 0.4 0.4 0.2* 0.2* 0.3  PROT 6.7 6.1* 5.9* 6.4* 6.1*  ALBUMIN 2.8*  2.6* 2.6* 2.8* 2.6*   No results for input(s): LIPASE, AMYLASE in the last 168 hours. No results for input(s): AMMONIA in the last 168 hours. CBC: Recent Labs  Lab 02/12/22 0340 02/14/22 0400 02/16/22 0327 02/17/22 0325 02/18/22 0335  WBC 4.2 5.5 4.2 3.7* 3.0*  NEUTROABS  2.2 3.7 2.5 1.5* 1.2*  HGB 8.3* 8.0* 7.7* 7.3* 7.7*  HCT 25.5* 25.3* 23.7* 23.1* 24.6*  MCV 86.4 89.1 88.8 90.2 88.8  PLT 61* 55* 63* 67* 73*   Cardiac Enzymes: No results for input(s): CKTOTAL, CKMB, CKMBINDEX, TROPONINI in the last 168 hours. BNP: BNP (last 3 results) No results for input(s): BNP in the last 8760 hours.  ProBNP (last 3 results) No results for input(s): PROBNP in the last 8760 hours.  CBG: Recent Labs  Lab 02/17/22 1213 02/17/22 1644 02/17/22 2013 02/18/22 0742 02/18/22 1141  GLUCAP 233* 196* 196* 173* 230*       Signed:  Nita Sells MD   [facility] 02/18/2022, 1:15 PM

## 2022-02-18 NOTE — Progress Notes (Addendum)
[name]   DOB:Oct 31, 1956   ZO#:[id number]    I saw the patient, examined her and agree with documentation as follows ASSESSMENT & PLAN:  Recurrent uterine cancer I have reviewed her CT imaging Lymphadenopathy is stable Continue supportive care We will delay her next treatment for 21 days from the date of COVID diagnosis Frankly, given recurrent hospitalization, it is not clear to me she can tolerate further chemotherapy I will resume discussion in the outpatient  Acquired pancytopenia Likely exacerbated by recent antibiotics and recent chemotherapy Monitor closely She was transfused recently Observe closely Hemoglobin 7.7 today and she is receiving 1 unit PRBCs No contraindication for her to receive anticoagulation therapy as her platelet count is greater than 50,000  Elevated troponin, history of heart failure Could be exacerbated by recent use of trastuzumab Echocardiogram performed 02/07/2022 showed LVEF of 60 to 65% However, given her high risk for, I do not believe she is a candidate for further treatment with trastuzumab in the future  Diverticulitis Abdominal pain seems to be intermittent at this point She is not having any loose stools this morning Trying to eat more She has completed a course of antibiotic On oral pain medication and pain is overall well controlled at this time GI following and no plans for colonoscopy at this time Continue supportive care  Severe protein calorie malnutrition She has not been eating well for the past month, in fact since last hospitalization from January She has failed outpatient IV fluid support, IV pain medicine and IV Zofran She is trying to eat more She was receiving Glucerna shakes but this may have been  exacerbating her diarrhea -has been switched to boost breeze 48-hour calorie count completed Her appetite is improving  Recent fall She is very debilitated Consider PT while hospitalized  COVID positivity She is at  extremely high risk due to immunocompromise state from recent chemotherapy, history of congestive heart failure and now severe pancytopenia Completed Paxlovid  Transaminitis LFTs elevated, but improving Since CT did not show any liver abnormality or gallstones ?due to antibiotics  Discharge planning Her clinical course seems to be overall improving  She does not have established primary care doctor She does not qualify for home health due to lack of insurance Due to COVID positivity, I cannot bring her back to the cancer center for further supportive care for 21 days and she has failed outpatient therapy prior to admission Overall seems to be improving and may be stable for discharge in the next day If she is better and stable for discharge, she has appointment scheduled for 02/26/2022  Mikey Bussing, NP 02/18/2022 10:21 AM Heath Lark, MD  Subjective:  Abdominal pain improved No bowel movement since Saturday Trying to eat more Denies nausea and vomiting  Objective:  Vitals:   02/18/22 0816 02/18/22 0847  BP: 123/65 (!) 145/63  Pulse: 62 (!) 105  Resp: 16 16  Temp: 98 F (36.7 C) 97.8 F (36.6 C)  SpO2: 99% 100%     Intake/Output Summary (Last 24 hours) at 02/18/2022 1021 Last data filed at 02/17/2022 2300 Gross per 24 hour  Intake 1107 ml  Output --  Net 1107 ml    GENERAL:alert, no distress and comfortable SKIN: skin color, texture, turgor are normal, no rashes or significant lesions EYES: normal, Conjunctiva are pink and non-injected, sclera clear OROPHARYNX:no exudate, no erythema and lips, buccal mucosa, and tongue normal  NECK: supple, thyroid normal size, non-tender, without nodularity LYMPH:  no palpable lymphadenopathy in  the cervical, axillary or inguinal LUNGS: clear to auscultation and percussion with normal breathing effort HEART: regular rate & rhythm and no murmurs and no lower extremity edema ABDOMEN:abdomen soft, diffuse tenderness without rebound or  guarding Musculoskeletal:no cyanosis of digits and no clubbing  NEURO: alert & oriented x 3 with fluent speech, no focal motor/sensory deficits   Labs:  Recent Labs    02/15/22 0910 02/16/22 0327 02/17/22 0325 02/18/22 0335  NA  --  134* 134* 136  K  --  3.6 3.7 3.8  CL  --  106 105 105  CO2  --  '22 23 24  '$ GLUCOSE  --  201* 175* 204*  BUN  --  '16 16 15  '$ CREATININE  --  0.96 0.87 0.82  CALCIUM  --  8.4* 8.3* 8.6*  GFRNONAA  --  >60 >60 >60  PROT 5.9* 6.4* 6.1*  --   ALBUMIN 2.6* 2.8* 2.6*  --   AST 51* 36 37  --   ALT 92* 74* 67*  --   ALKPHOS 347* 306* 313*  --   BILITOT 0.2* 0.2* 0.3  --   BILIDIR 0.1  --   --   --   IBILI 0.1*  --   --   --     Studies: I have reviewed her CT imaging DG Shoulder Right  Result Date: 02/04/2022 CLINICAL DATA:  Uterine cancer.  Diarrhea.  Right shoulder pain. EXAM: RIGHT SHOULDER - 2+ VIEW COMPARISON:  None. FINDINGS: There is diffuse decreased bone mineralization. Mild acromioclavicular joint space narrowing with mild inferior degenerative osteophytes. Mild inferior glenoid degenerative osteophytosis. No acute fracture is seen. No dislocation. Partial visualization of right chest port a catheter. 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 Electronically Signed   By: Yvonne Kendall M.D.   On: 02/04/2022 19:08   DG Abd 1 View  Result Date: 02/13/2022 CLINICAL DATA:  Abdominal pain EXAM: ABDOMEN - 1 VIEW COMPARISON:  02/09/2022 abdominal radiograph, CT abdomen and pelvis 02/04/2022 FINDINGS: Nonobstructive bowel gas pattern. No bowel dilatation or bowel wall thickening. Lung bases clear with chronic elevation of RIGHT diaphragm noted. 9 mm high attenuation curvilinear focus is identified in the RIGHT mid abdomen; this corresponds to retained contrast or calcified material within a diverticulum of the ascending colon on a recent CT. No definite urinary tract calcifications identified. Multiple small pelvic phleboliths.  IMPRESSION: No acute abnormalities. Electronically Signed   By: Lavonia Dana M.D.   On: 02/13/2022 17:30   CT Head Wo Contrast  Result Date: 02/04/2022 CLINICAL DATA:  Mental status change, unknown cause EXAM: CT HEAD WITHOUT CONTRAST TECHNIQUE: Contiguous axial images were obtained from the base of the skull through the vertex without intravenous contrast. RADIATION DOSE REDUCTION: This exam was performed according to the departmental dose-optimization program which includes automated exposure control, adjustment of the mA and/or kV according to patient size and/or use of iterative reconstruction technique. COMPARISON:  None. FINDINGS: Brain: There is no acute intracranial hemorrhage, mass effect, or edema. Gray-white differentiation is preserved. Small focus of low density in the white matter adjacent to the posterior body of the left lateral ventricle. There is no extra-axial fluid collection. Ventricles and sulci are within normal limits in size and configuration. Vascular: There is atherosclerotic calcification at the skull base. Skull: Calvarium is unremarkable. Sinuses/Orbits: No acute finding. Other: None. IMPRESSION: No acute intracranial hemorrhage. 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 Electronically Signed  By: Macy Mis M.D.   On: 02/04/2022 16:55   CT Abdomen Pelvis W Contrast  Result Date: 02/04/2022 CLINICAL DATA:  Abdominal pain.  History of uterine cancer EXAM: CT ABDOMEN AND PELVIS WITH CONTRAST TECHNIQUE: Multidetector CT imaging of the abdomen and pelvis was performed using the standard protocol following bolus administration of intravenous contrast. RADIATION DOSE REDUCTION: This exam was performed according to the departmental dose-optimization program which includes automated exposure control, adjustment of the mA and/or kV according to patient size and/or use of iterative reconstruction technique.  CONTRAST:  163m OMNIPAQUE IOHEXOL 300 MG/ML  SOLN COMPARISON:  01/28/2022 FINDINGS: Lower chest: Included lung bases are clear. Heart size within normal limits. Hepatobiliary: No focal liver abnormality is seen. No gallstones, gallbladder wall thickening, or biliary dilatation. Pancreas: Unremarkable. No pancreatic ductal dilatation or surrounding inflammatory changes. Spleen: Normal in size without focal abnormality. Adrenals/Urinary Tract: Unremarkable adrenal glands. Kidneys enhance symmetrically without focal lesion, stone, or hydronephrosis. Ureters are nondilated. Urinary bladder appears unremarkable. Stomach/Bowel: Colonic diverticulosis with new mild pericolonic fat stranding adjacent to a prominent diverticula at the proximal sigmoid colon where there is short segment bowel thickening (series 5, images 66-68). Scattered diverticulosis within the remaining sigmoid and descending colon. No additional sites of focal inflammation. Normal appendix in the right lower quadrant. No dilated loops of bowel. Stomach within normal limits. Vascular/Lymphatic: Scattered atherosclerotic calcifications throughout the aortoiliac axis. Stable mildly enlarged left pelvic sidewall lymph node measuring approximately 1.3 cm short axis (series 5, image 74). Reproductive: Prior hysterectomy. Stable prominence at the vaginal cuff, indeterminate. No adnexal masses. Other: Mild presacral fluid/edema. No organized abdominopelvic fluid collection. No pneumoperitoneum. Musculoskeletal: No new or acute bony findings. IMPRESSION: 1. Acute uncomplicated sigmoid diverticulitis. 2. Prior hysterectomy with stable prominence at the vaginal cuff, indeterminate. Stable mildly enlarged left pelvic sidewall lymph node. Aortic Atherosclerosis (ICD10-I70.0). Electronically Signed   By: NDavina PokeD.O.   On: 02/04/2022 16:58   CT Abdomen Pelvis W Contrast  Result Date: 01/28/2022 CLINICAL DATA:  Epigastric abdominal pain, shortness of  breath, nausea vomiting, endometrial carcinoma receiving chemotherapy EXAM: CT ABDOMEN AND PELVIS WITH CONTRAST TECHNIQUE: Multidetector CT imaging of the abdomen and pelvis was performed using the standard protocol following bolus administration of intravenous contrast. RADIATION DOSE REDUCTION: This exam was performed according to the departmental dose-optimization program which includes automated exposure control, adjustment of the mA and/or kV according to patient size and/or use of iterative reconstruction technique. CONTRAST:  1089mOMNIPAQUE IOHEXOL 300 MG/ML  SOLN COMPARISON:  01/07/2022 FINDINGS: Lower chest: No acute abnormality. Hepatobiliary: No focal liver abnormality is seen. No gallstones, gallbladder wall thickening, or biliary dilatation. Pancreas: Unremarkable. No pancreatic ductal dilatation or surrounding inflammatory changes. Spleen: Normal in size without focal abnormality. Adrenals/Urinary Tract: Adrenal glands are unremarkable. Kidneys are normal, without renal calculi, focal lesion, or hydronephrosis. Bladder is unremarkable. Stomach/Bowel: Similar nonspecific thickening of the duodenal C loop may be related to under distension versus improving duodenitis when compared to 01/07/2022. Negative for bowel obstruction, significant dilatation, ileus, or free air. Normal retrocecal appendix identified. Scattered colonic diverticulosis without acute inflammatory process. No free fluid, fluid collection, hemorrhage, hematoma, abscess or ascites. Vascular/Lymphatic: Aortic atherosclerosis noted without aneurysm, dissection or occlusive process. No retroperitoneal hemorrhage or hematoma. Mesenteric and renal vasculature appears to remain patent. No veno-occlusive process. Stable left iliac pelvic sidewall mild adenopathy measuring 1.4 cm in diameter, image 70/2. No inguinal adenopathy. Reproductive: Previous hysterectomy. Stable indeterminate soft tissue prominence of the vaginal cuff. No pelvic  free  fluid, fluid collection, hemorrhage or hematoma. No adnexal mass. Other: No abdominal wall hernia or abnormality. No abdominopelvic ascites. Musculoskeletal: Degenerative changes throughout the spine with lower lumbar facet arthropathy. No acute osseous finding. IMPRESSION: No acute intra-abdominal or pelvic finding by CT. Interval improvement in the previous mild duodenitis changes by CT when compared to 01/07/2022. Colonic diverticulosis without acute inflammatory process. Normal appendix Remote hysterectomy. Stable indeterminate soft tissue prominence of the vaginal cuff and left pelvic sidewall mild adenopathy, suspicious for residual disease. Aortic Atherosclerosis (ICD10-I70.0). Electronically Signed   By: Jerilynn Mages.  Shick M.D.   On: 01/28/2022 17:41   DG CHEST PORT 1 VIEW  Result Date: 02/09/2022 CLINICAL DATA:  COVID positive EXAM: PORTABLE CHEST 1 VIEW COMPARISON:  02/04/2022 FINDINGS: Cardiac size is within normal limits. Tip of right IJ chest port is seen in the superior vena cava. Lung fields are clear of any infiltrates or pulmonary edema. There is no pleural effusion or pneumothorax. IMPRESSION: No active disease. Electronically Signed   By: Elmer Picker M.D.   On: 02/09/2022 09:31   DG Chest Port 1 View  Result Date: 02/04/2022 CLINICAL DATA:  History of uterine cancer. Diarrhea. Unable to eat for 4 days. EXAM: PORTABLE CHEST 1 VIEW COMPARISON:  January 28, 2022 FINDINGS: The heart size and mediastinal contours are stable. Right central venous line is unchanged. Both lungs are clear. The visualized skeletal structures are unremarkable. IMPRESSION: No active disease. Electronically Signed   By: Abelardo Diesel M.D.   On: 02/04/2022 14:20   DG Chest Port 1 View  Result Date: 01/28/2022 CLINICAL DATA:  Dyspnea, uterine cancer on chemotherapy EXAM: PORTABLE CHEST 1 VIEW COMPARISON:  01/04/2022 chest radiograph. FINDINGS: Right internal jugular Port-A-Cath terminates in the lower third of the  SVC. Stable cardiomediastinal silhouette with normal heart size. No pneumothorax. No pleural effusion. Lungs appear clear, with no acute consolidative airspace disease and no pulmonary edema. IMPRESSION: No active disease. Electronically Signed   By: Ilona Sorrel M.D.   On: 01/28/2022 15:18   DG Abd 2 Views  Result Date: 02/16/2022 CLINICAL DATA:  Colonic diverticulitis. On chemotherapy for uterine cancer. EXAM: ABDOMEN - 2 VIEW COMPARISON:  Abdominal x-ray 02/13/2022. CT abdomen and pelvis 02/04/2022. FINDINGS: The bowel gas pattern is normal. There is no evidence of free air. Phleboliths are unchanged in the pelvis. Retained contrast within colonic diverticulum is unchanged in the right abdomen. No acute fractures are seen. There are vascular calcifications in the pelvis. Catheters are partially visualized overlying the chest. IMPRESSION: 1. No acute abnormality. Electronically Signed   By: Ronney Asters M.D.   On: 02/16/2022 17:17   DG Abd Portable 1V  Result Date: 02/09/2022 CLINICAL DATA:  Abdominal pain EXAM: PORTABLE ABDOMEN - 1 VIEW COMPARISON:  CT abdomen done on 02/04/2022 FINDINGS: Bowel gas pattern is nonspecific. Is no small bowel dilation. Gas and stool are present in colon. No abnormal masses are seen. There is faint 11 mm ring-like calcific density in the right mid abdomen which corresponds to a diverticulum with high density in the ascending colon seen in the previous CT. Kidneys are partly obscured by bowel contents. IMPRESSION: Nonspecific bowel gas pattern. Electronically Signed   By: Elmer Picker M.D.   On: 02/09/2022 09:30   ECHOCARDIOGRAM COMPLETE  Result Date: 02/07/2022    ECHOCARDIOGRAM REPORT   Patient Name:   HIDAYA DANIEL Date of Exam: 02/07/2022 Medical Rec #:  [id number]     Height:  62.0 in Accession #:    [id number]    Weight:       215.0 lb Date of Birth:  Dec 07, 1956    BSA:          1.971 m Patient Age:    31 years      BP:           137/54 mmHg Patient  Gender: F             HR:           73 bpm. Exam Location:  Inpatient Procedure: 2D Echo, Cardiac Doppler, Color Doppler and Strain Analysis Indications:    Elevated troponin  History:        Patient has prior history of Echocardiogram examinations, most                 recent 12/25/2021. CAD and Angina; Risk Factors:Hypertension,                 Diabetes and Dyslipidemia. 06/07/2013 stent.  Sonographer:    [place] Referring Phys: [id number] Timathy Newberry [place]  1. Density in right atrium (probably related to porta cath); not as well visualized on previous study.  2. Left ventricular ejection fraction, by estimation, is 60 to 65%. The left ventricle has normal function. The left ventricle has no regional wall motion abnormalities. There is mild left ventricular hypertrophy. Left ventricular diastolic parameters are consistent with Grade I diastolic dysfunction (impaired relaxation). Elevated left atrial pressure.  3. Right ventricular systolic function is normal. The right ventricular size is normal.  4. Left atrial size was moderately dilated.  5. The mitral valve is normal in structure. Mild mitral valve regurgitation. No evidence of mitral stenosis.  6. The aortic valve is tricuspid. Aortic valve regurgitation is not visualized. Aortic valve sclerosis is present, with no evidence of aortic valve stenosis.  7. The inferior vena cava is normal in size with greater than 50% respiratory variability, suggesting right atrial pressure of 3 mmHg. FINDINGS  Left Ventricle: Left ventricular ejection fraction, by estimation, is 60 to 65%. The left ventricle has normal function. The left ventricle has no regional wall motion abnormalities. The left ventricular internal cavity size was normal in size. There is  mild left ventricular hypertrophy. Left ventricular diastolic parameters are consistent with Grade I diastolic dysfunction (impaired relaxation). Elevated left atrial pressure. Right Ventricle: The right  ventricular size is normal. Right ventricular systolic function is normal. Left Atrium: Left atrial size was moderately dilated. Right Atrium: Right atrial size was normal in size. Pericardium: There is no evidence of pericardial effusion. Mitral Valve: The mitral valve is normal in structure. Mild mitral annular calcification. Mild mitral valve regurgitation. No evidence of mitral valve stenosis. MV peak gradient, 7.6 mmHg. The mean mitral valve gradient is 3.0 mmHg. Tricuspid Valve: The tricuspid valve is normal in structure. Tricuspid valve regurgitation is trivial. No evidence of tricuspid stenosis. Aortic Valve: The aortic valve is tricuspid. Aortic valve regurgitation is not visualized. Aortic valve sclerosis is present, with no evidence of aortic valve stenosis. Aortic valve mean gradient measures 6.0 mmHg. Aortic valve peak gradient measures 10.5 mmHg. Aortic valve area, by VTI measures 3.74 cm. Pulmonic Valve: The pulmonic valve was normal in structure. Pulmonic valve regurgitation is not visualized. No evidence of pulmonic stenosis. Aorta: The aortic root is normal in size and structure. Venous: The inferior vena cava is normal in size with greater than 50% respiratory variability, suggesting right atrial pressure of  3 mmHg. IAS/Shunts: No atrial level shunt detected by color flow Doppler. Additional Comments: Density in right atrium (probably related to porta cath); not as well visualized on previous study.  LEFT VENTRICLE PLAX 2D LVIDd:         4.10 cm   Diastology LVIDs:         2.30 cm   LV e' medial:    5.55 cm/s LV PW:         1.40 cm   LV E/e' medial:  19.5 LV IVS:        1.20 cm   LV e' lateral:   8.70 cm/s LVOT diam:     2.30 cm   LV E/e' lateral: 12.4 LV SV:         122 LV SV Index:   62 LVOT Area:     4.15 cm  RIGHT VENTRICLE RV Basal diam:  3.30 cm RV Mid diam:    2.90 cm RV S prime:     14.30 cm/s TAPSE (M-mode): 2.4 cm LEFT ATRIUM           Index        RIGHT ATRIUM           Index LA diam:       3.70 cm 1.88 cm/m   RA Area:     12.40 cm LA Vol (A4C): 87.3 ml 44.28 ml/m  RA Volume:   27.00 ml  13.70 ml/m  AORTIC VALVE                     PULMONIC VALVE AV Area (Vmax):    3.69 cm      PV Vmax:       0.99 m/s AV Area (Vmean):   3.40 cm      PV Vmean:      65.150 cm/s AV Area (VTI):     3.74 cm      PV VTI:        0.185 m AV Vmax:           162.00 cm/s   PV Peak grad:  3.9 mmHg AV Vmean:          110.000 cm/s  PV Mean grad:  2.0 mmHg AV VTI:            0.327 m AV Peak Grad:      10.5 mmHg AV Mean Grad:      6.0 mmHg LVOT Vmax:         144.00 cm/s LVOT Vmean:        90.000 cm/s LVOT VTI:          0.294 m LVOT/AV VTI ratio: 0.90  AORTA Ao Root diam: 2.60 cm Ao Asc diam:  3.10 cm MITRAL VALVE                TRICUSPID VALVE MV Area (PHT): 3.74 cm     TR Peak grad:   42.0 mmHg MV Area VTI:   3.62 cm     TR Vmax:        324.00 cm/s MV Peak grad:  7.6 mmHg MV Mean grad:  3.0 mmHg     SHUNTS MV Vmax:       1.38 m/s     Systemic VTI:  0.29 m MV Vmean:      77.0 cm/s    Systemic Diam: 2.30 cm MV Decel Time: 203 msec MR Peak grad: 109.4 mmHg MR Mean grad: 75.0 mmHg MR Vmax:  523.00 cm/s MR Vmean:     414.0 cm/s MV E velocity: 108.00 cm/s MV A velocity: 121.00 cm/s MV E/A ratio:  0.89 Kirk Ruths MD Electronically signed by Kirk Ruths MD Signature Date/Time: 02/07/2022/2:40:51 PM    Final    VAS [place] LOWER EXTREMITY VENOUS (DVT)  Result Date: 02/07/2022  Lower Venous DVT Study Patient Name:  TOMMYE LEHENBAUER  Date of Exam:   02/07/2022 Medical Rec #: [id number]      Accession #:    [id number] Date of Birth: 1956/08/16     Patient Gender: F Patient Age:   57 years Exam Location:  [facility] Procedure:      VAS [place] LOWER EXTREMITY VENOUS (DVT) Referring Phys: PRANAV PATEL --------------------------------------------------------------------------------  Indications: Edema.  Comparison Study: 07/19/2018- negative right lower extremity venous duplex Performing Technologist: Maudry Mayhew MHA,  RDMS, RVT, RDCS  Examination Guidelines: A complete evaluation includes B-mode imaging, spectral Doppler, color Doppler, and power Doppler as needed of all accessible portions of each vessel. Bilateral testing is considered an integral part of a complete examination. Limited examinations for reoccurring indications may be performed as noted. The reflux portion of the exam is performed with the patient in reverse Trendelenburg.  +---------+---------------+---------+-----------+----------+--------------+  RIGHT     Compressibility Phasicity Spontaneity Properties Thrombus Aging  +---------+---------------+---------+-----------+----------+--------------+  CFV       Full            Yes       Yes                                    +---------+---------------+---------+-----------+----------+--------------+  SFJ       Full                                                             +---------+---------------+---------+-----------+----------+--------------+  FV Prox   Full                                                             +---------+---------------+---------+-----------+----------+--------------+  FV Mid    Full                                                             +---------+---------------+---------+-----------+----------+--------------+  FV Distal Full                                                             +---------+---------------+---------+-----------+----------+--------------+  PFV       Full                                                             +---------+---------------+---------+-----------+----------+--------------+  POP       Full            Yes       Yes                                    +---------+---------------+---------+-----------+----------+--------------+  PTV       Full                                                             +---------+---------------+---------+-----------+----------+--------------+  PERO      Full                                                              +---------+---------------+---------+-----------+----------+--------------+   +---------+---------------+---------+-----------+----------+--------------+  LEFT      Compressibility Phasicity Spontaneity Properties Thrombus Aging  +---------+---------------+---------+-----------+----------+--------------+  CFV       Full            Yes       Yes                                    +---------+---------------+---------+-----------+----------+--------------+  SFJ       Full                                                             +---------+---------------+---------+-----------+----------+--------------+  FV Prox   Full                                                             +---------+---------------+---------+-----------+----------+--------------+  FV Mid    Full                                                             +---------+---------------+---------+-----------+----------+--------------+  FV Distal Full                                                             +---------+---------------+---------+-----------+----------+--------------+  PFV       Full                                                             +---------+---------------+---------+-----------+----------+--------------+  POP       Full            Yes       Yes                                    +---------+---------------+---------+-----------+----------+--------------+  PTV       Full                                                             +---------+---------------+---------+-----------+----------+--------------+  PERO      Full                                                             +---------+---------------+---------+-----------+----------+--------------+     Summary: RIGHT: - There is no evidence of deep vein thrombosis in the lower extremity.  - No cystic structure found in the popliteal fossa.  LEFT: - There is no evidence of deep vein thrombosis in the lower extremity.  - No cystic structure found in the popliteal  fossa.  *See table(s) above for measurements and observations. Electronically signed by Orlie Pollen on 02/07/2022 at 6:12:48 PM.    Final

## 2022-02-18 NOTE — Progress Notes (Incomplete)
Patient given AVS, home medications from outpatient pharmacy ?

## 2022-02-19 ENCOUNTER — Telehealth: Payer: Self-pay

## 2022-02-19 LAB — BPAM RBC
Blood Product Expiration Date: 202304052359
ISSUE DATE / TIME: 202303060822
Unit Type and Rh: 5100

## 2022-02-19 LAB — TYPE AND SCREEN
ABO/RH(D): O POS
Antibody Screen: NEGATIVE
Unit division: 0

## 2022-02-19 NOTE — Telephone Encounter (Signed)
Transition Care Management Follow-up Telephone Call ? ?Call completed with patient's daughter, [name].  DPR on file to speak with her.  ?Date of discharge and from where: 02/18/2022, [facility]  ?How have you been since you were released from the hospital? [name] stated that her mother is eating okay, so far, so good.  ?Any questions or concerns? No ? ?Items Reviewed: ?Did the pt receive and understand the discharge instructions provided? Yes  ?Medications obtained and verified? Yes  - [name] said that she has all of her medications and she did not have any questions about the med regime.  ?Other? No  ?Any new allergies since your discharge? No  ?Dietary orders reviewed? No ?Do you have support at home? Yes  ? ?Home Care and Equipment/Supplies: ?Were home health services ordered? no ?If so, what is the name of the agency? N/a  ?Has the agency set up a time to come to the patient's home? not applicable ?Were any new equipment or medical supplies ordered?  No ?What is the name of the medical supply agency? N/a ?Were you able to get the supplies/equipment? not applicable ?Do you have any questions related to the use of the equipment or supplies? No ? ?Functional Questionnaire: (I = Independent and D = Dependent) ?ADLs: independent. [name] helps with medication management. The patient has a cane that she uses when going out of the house.  ? ? ?Follow up appointments reviewed: ? ?PCP Hospital f/u appt confirmed? Yes  Scheduled to see Dr Margarita Rana  - 43/2023.  ?[facility] f/u appt confirmed? Yes  Scheduled to see oncology - 02/26/2022 and GI - 03/05/2022.  ?Are transportation arrangements needed? No  ?If their condition worsens, is the pt aware to call PCP or go to the Emergency Dept.? Yes ?Was the patient provided with contact information for the PCP's office or ED? Yes ?Was to pt encouraged to call back with questions or concerns? Yes ? ?

## 2022-02-19 NOTE — Telephone Encounter (Signed)
From the discharge call: ? ?Call completed with patient's daughter, [name].  DPR on file to speak with her.  ?[name] stated that her mother is eating okay, so far, so good.  ? ?[name] said that she has all of her medications and she did not have any questions about the med regime.  ? ?Scheduled to see Dr Margarita Rana  - 43/2023, oncology - 02/26/2022 and GI - 03/05/2022.  ? ?

## 2022-02-26 ENCOUNTER — Other Ambulatory Visit: Payer: Self-pay | Admitting: Hematology and Oncology

## 2022-02-26 ENCOUNTER — Inpatient Hospital Stay: Payer: Medicaid Other | Admitting: Hematology and Oncology

## 2022-02-26 ENCOUNTER — Inpatient Hospital Stay: Payer: Medicaid Other

## 2022-02-26 ENCOUNTER — Encounter: Payer: Self-pay | Admitting: Hematology and Oncology

## 2022-02-26 ENCOUNTER — Inpatient Hospital Stay: Payer: Medicaid Other | Attending: Gynecologic Oncology | Admitting: Hematology and Oncology

## 2022-02-26 ENCOUNTER — Ambulatory Visit: Payer: Self-pay

## 2022-02-26 ENCOUNTER — Telehealth: Payer: Self-pay

## 2022-02-26 ENCOUNTER — Other Ambulatory Visit: Payer: Self-pay

## 2022-02-26 DIAGNOSIS — Z7189 Other specified counseling: Secondary | ICD-10-CM | POA: Diagnosis not present

## 2022-02-26 DIAGNOSIS — I700 Atherosclerosis of aorta: Secondary | ICD-10-CM | POA: Diagnosis not present

## 2022-02-26 DIAGNOSIS — Z79899 Other long term (current) drug therapy: Secondary | ICD-10-CM | POA: Insufficient documentation

## 2022-02-26 DIAGNOSIS — I5032 Chronic diastolic (congestive) heart failure: Secondary | ICD-10-CM | POA: Diagnosis not present

## 2022-02-26 DIAGNOSIS — K5732 Diverticulitis of large intestine without perforation or abscess without bleeding: Secondary | ICD-10-CM | POA: Diagnosis not present

## 2022-02-26 DIAGNOSIS — Z7984 Long term (current) use of oral hypoglycemic drugs: Secondary | ICD-10-CM | POA: Insufficient documentation

## 2022-02-26 DIAGNOSIS — I110 Hypertensive heart disease with heart failure: Secondary | ICD-10-CM | POA: Insufficient documentation

## 2022-02-26 DIAGNOSIS — I3481 Nonrheumatic mitral (valve) annulus calcification: Secondary | ICD-10-CM | POA: Insufficient documentation

## 2022-02-26 DIAGNOSIS — C541 Malignant neoplasm of endometrium: Secondary | ICD-10-CM | POA: Insufficient documentation

## 2022-02-26 DIAGNOSIS — D61818 Other pancytopenia: Secondary | ICD-10-CM | POA: Diagnosis not present

## 2022-02-26 DIAGNOSIS — Z7982 Long term (current) use of aspirin: Secondary | ICD-10-CM | POA: Diagnosis not present

## 2022-02-26 NOTE — Assessment & Plan Note (Signed)
She tolerated treatment very poorly with recurrent hospitalizations ?She is doing better since discharge from the hospital ?Her last imaging study from the hospital showed no significant signs of residual disease except for a small pelvic lymph node ?I recommend we take a treatment break and focus on supportive care ?I plan to see her again next month for further follow-up ?After reviewing all the test results, she is in agreement to take a break from treatment ?

## 2022-02-26 NOTE — Assessment & Plan Note (Signed)
She had pancytopenia from treatment requiring transfusion support when she was in the hospital ?Clinically, she felt better ?For now, I recommend close observation ?I plan to repeat blood work next month for further follow-up ?

## 2022-02-26 NOTE — Progress Notes (Signed)
[place] [place] ?OFFICE PROGRESS NOTE ? ?Patient Care Team: ?[name], MD as PCP - General (Family Medicine) ?Croitoru, Dani Gobble, MD as PCP - Cardiology (Cardiology) ? 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 ?Pancytopenia, acquired ([place]) ?She had pancytopenia from treatment requiring transfusion support when she was in the hospital ?Clinically, she felt better ?For now, I recommend close observation ?I plan to repeat blood work next month for further follow-up ? ?Chronic diastolic heart failure ([place]) ?Last month, she has evidence of elevated troponin ?Thankfully, echocardiogram showed no evidence of congestive heart failure ?Observe closely for now ? ?Goals of care, counseling/discussion ?We have significant discussions about goals of care ?I try my best ability to explain to the daughter and the patient the importance of focusing on quality of life at this point ?We need to focus on healthy dietary choices, improve physical activity as tolerated and take a break from treatment to recover from side effects of treatment ?It is not clear to me whether she can be retreated with similar chemotherapy or switch to immunotherapy once she has Medicaid ? ?No orders of the defined types were placed in this encounter. ? ? ?All questions were answered. The patient knows to call the clinic with any problems, questions or concerns. ?The total time spent in the appointment was 40 minutes encounter with patients including  review of chart and various tests results, discussions about plan of care and coordination of care plan ?  ?Heath Lark, MD ?02/26/2022 10:41 AM ? ?INTERVAL HISTORY: ?Please see below for problem oriented charting. ?she returns for treatment follow-up with her daughter ?Since discharge from the hospital, she felt better ?She denies further abdominal pain and has not taken any morphine sulfate ?She has regular normal bowel habits ?She is eating better ?No recent fever or chills ? ?REVIEW OF SYSTEMS:   ?Constitutional: Denies fevers, chills or abnormal weight loss ?Eyes: Denies blurriness of vision ?Ears, nose, mouth, throat, and face: Denies mucositis or sore throat ?Respiratory: Denies cough, dyspnea or wheezes ?Cardiovascular: Denies palpitation, chest discomfort or lower extremity swelling ?Gastrointestinal:  Denies nausea, heartburn or change in bowel habits ?Skin: Denies abnormal skin rashes ?Lymphatics: Denies new lymphadenopathy or easy bruising ?Neurological:Denies numbness, tingling or new weaknesses ?Behavioral/Psych: Mood is stable, no new changes  ?All other systems were reviewed with the patient and are negative. ? ?I have reviewed the past medical history, past surgical history, social history and family history with the patient and they are unchanged from previous note. ? ?ALLERGIES:  has No Known Allergies. ? ?MEDICATIONS:  ?Current Outpatient Medications  ?Medication Sig Dispense Refill  ? amLODipine (NORVASC) 5 MG tablet Take 1 tablet by mouth daily. 30 tablet 0  ? aspirin 81 MG EC tablet Take 1 tablet (81 mg total) by mouth daily. 30 tablet 12  ? atorvastatin (LIPITOR) 80 MG tablet TAKE 1 TABLET (80 MG TOTAL) BY MOUTH AT BEDTIME. 30 tablet 6  ? calcium carbonate (TUMS - DOSED IN MG ELEMENTAL CALCIUM) 500 MG chewable tablet Chew 1 tablet  by mouth daily.    ? cholestyramine light (PREVALITE) 4 g packet Dissolve in water as directed then take 1 packet by mouth daily at 12 noon. 30 each 0  ? docusate  sodium (COLACE) 100 MG capsule Take 100 mg by mouth daily as needed for mild constipation.    ? Dulaglutide (TRULICITY) 1.5 SH/7.64YO SOPN Inject 1.5 mg into the skin once a week. (Patient taking differently: Inject 1.5 mg into the skin once a week. Friday/Saturday) 2 mL 2  ? furosemide (LASIX) 20 MG tablet Take 1 tablet (20 mg total) by mouth every other day. 30 tablet   ? glimepiride (AMARYL) 4 MG tablet Take 1 tablet (4 mg total) by mouth daily with breakfast. 30 tablet 6  ? hydrocortisone (ANUSOL-HC) 2.5 % rectal cream Place rectally as directed 2 times daily. 30 g 0  ? isosorbide mononitrate (IMDUR) 30 MG 24 hr tablet Take 2 tablets (60 mg total) by mouth daily. 60 tablet 2  ? lisinopril (ZESTRIL) 20 MG tablet TAKE 1 TABLET BY MOUTH ONCE A DAY 30 tablet 2  ? liver oil-zinc oxide (DESITIN) 40 % ointment Apply 1 application topically as needed for irritation.    ? loperamide (IMODIUM) 2 MG capsule Take 1 capsule by mouth 2 times daily. 30 capsule 0  ? metFORMIN (GLUCOPHAGE) 500 MG tablet Take 2 tablets (1,000 mg total) by mouth 2 (two) times daily with a meal. 120 tablet 2  ? metoprolol tartrate (LOPRESSOR) 100 MG tablet TAKE 1 TABLET (100 MG TOTAL) BY MOUTH 2 (TWO) TIMES DAILY. 60 tablet 2  ? morphine (MSIR) 15 MG tablet Take 1 tablet (15 mg total) by mouth every 4 (four) hours as needed for severe pain. 30 tablet 0  ? nitroGLYCERIN (NITROSTAT) 0.4 MG SL tablet Place 1 tablet (0.4 mg total) under the tongue every 5 (five) minutes as needed for chest pain. 25 tablet 2  ? ondansetron (ZOFRAN) 8 MG tablet Take 1 tablet by mouth 2  times daily as needed for refractory nausea / vomiting. Start on day 3 after carboplatin chemo. 30 tablet 1  ? pantoprazole (PROTONIX) 40 MG tablet Take 1 tablet (40 mg total) by mouth daily. 30 tablet 1  ? prochlorperazine (COMPAZINE) 10 MG tablet Take 1 tablet by mouth every 6  hours as needed (Nausea or vomiting). 30 tablet 1  ? saccharomyces boulardii (FLORASTOR) 250 MG capsule Take 1  capsule (250 mg total) by mouth 2 (two) times daily. 60 capsule 0  ? ?No current facility-administered medications for this visit.  ? ? ?SUMMARY OF ONCOLOGIC HISTORY: ?Oncology History Overview Note  ?Pap 12/14/20: adenocarcinoma, HPV negative ? ?HER-2 positive by FISH ?Her-2 equivocal by IHC ? ?MSI stable ? ?  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 Initial Diagnosis  ? Endometrial carcinoma ([place]) ?  ?01/12/2021 Initial Biopsy  ? A. ENDOCERVIX, CURETTAGE:  ?- Adenocarcinoma.  ?B. ENDOMETRIUM, BIOPSY:  ?- Adenocarcinoma.  ?COMMENT:  ?The differential diagnosis includes high grade serous carcinoma.  Both  ?specimens have a similar morphology; high grade serous carcinoma more  ?commonly arises in the endometrium.  Results reported to Dr. Hale Bogus  ?on 01/15/2021.  Dr. Saralyn Pilar reviewed the case.  ?  ?01/16/2021 Tumor Marker  ? Patient's tumor was tested for the following markers: CA-125 ?Results of the tumor marker test revealed normal value, 10.7 ?  ?01/25/2021 Imaging  ? CT C/A/P: ?1. Small volume fluid in the endometrial canal with 7 mm ?hypoattenuating lesion in the myometrium of  the anterior fundus. ?2. No definite evidence for metastatic disease in the chest, ?abdomen, or pelvis. Small lymph nodes are noted along both pelvic ?sidewalls. Attention on follow-up recommended. ?3. 5 mm ground-glass opacity peripheral left upper lobe. This is ?likely related to infection/inflammation and potentially scar. ?Attention on surveillance imaging recommended. ?4. 5.8 cm lesion posterior lower uterine segment likely a fibroid. ?Ultrasound exam from 01/28/2010 demonstrated a 5.2 cm lesion in the ?left aspect of the posterior lower uterine body. ?5. Aortic Atherosclerosis (ICD10-I70.0). ?  ?01/29/2021 Surgery  ? TRH/BSO, SLN biopsy left, selective pelvic LND right, omentectomy ? ?Findings: On EUA, 10-12cm enlarged mobile uterus. On intra-abdomina entry, minimal adhesions between the liver and anterior abdomen on  the right. Some changes c/w fatty liver on the left. Omentum, stomach, small and large bowel all grossly normal. Bilateral ovaries normal appearing. Uterus with 5-6cm posterior fibroid, otherwise normal ap

## 2022-02-26 NOTE — Telephone Encounter (Signed)
Called to remind of today's appt. They will arrive shortly. ?

## 2022-02-26 NOTE — Assessment & Plan Note (Signed)
Last month, she has evidence of elevated troponin ?Thankfully, echocardiogram showed no evidence of congestive heart failure ?Observe closely for now ?

## 2022-02-26 NOTE — Assessment & Plan Note (Signed)
We have significant discussions about goals of care ?I try my best ability to explain to the daughter and the patient the importance of focusing on quality of life at this point ?We need to focus on healthy dietary choices, improve physical activity as tolerated and take a break from treatment to recover from side effects of treatment ?It is not clear to me whether she can be retreated with similar chemotherapy or switch to immunotherapy once she has Medicaid ?

## 2022-03-05 ENCOUNTER — Ambulatory Visit: Payer: Self-pay | Admitting: Gastroenterology

## 2022-03-06 ENCOUNTER — Other Ambulatory Visit: Payer: Self-pay

## 2022-03-06 ENCOUNTER — Encounter: Payer: Self-pay | Admitting: Hematology and Oncology

## 2022-03-13 ENCOUNTER — Other Ambulatory Visit: Payer: Self-pay

## 2022-03-18 ENCOUNTER — Encounter: Payer: Self-pay | Admitting: Gastroenterology

## 2022-03-18 ENCOUNTER — Encounter: Payer: Self-pay | Admitting: Hematology and Oncology

## 2022-03-18 ENCOUNTER — Telehealth: Payer: Self-pay

## 2022-03-18 ENCOUNTER — Ambulatory Visit: Payer: Medicaid Other | Attending: Family Medicine | Admitting: Family Medicine

## 2022-03-18 ENCOUNTER — Encounter: Payer: Self-pay | Admitting: Family Medicine

## 2022-03-18 ENCOUNTER — Ambulatory Visit (INDEPENDENT_AMBULATORY_CARE_PROVIDER_SITE_OTHER): Payer: Medicaid Other | Admitting: Gastroenterology

## 2022-03-18 ENCOUNTER — Other Ambulatory Visit: Payer: Self-pay

## 2022-03-18 VITALS — BP 143/86 | HR 79 | Ht 62.0 in | Wt 219.8 lb

## 2022-03-18 VITALS — BP 122/80 | HR 88 | Ht 62.0 in | Wt 217.0 lb

## 2022-03-18 DIAGNOSIS — Z6841 Body Mass Index (BMI) 40.0 and over, adult: Secondary | ICD-10-CM

## 2022-03-18 DIAGNOSIS — N939 Abnormal uterine and vaginal bleeding, unspecified: Secondary | ICD-10-CM

## 2022-03-18 DIAGNOSIS — I110 Hypertensive heart disease with heart failure: Secondary | ICD-10-CM | POA: Diagnosis not present

## 2022-03-18 DIAGNOSIS — I10 Essential (primary) hypertension: Secondary | ICD-10-CM

## 2022-03-18 DIAGNOSIS — E1159 Type 2 diabetes mellitus with other circulatory complications: Secondary | ICD-10-CM

## 2022-03-18 DIAGNOSIS — I5032 Chronic diastolic (congestive) heart failure: Secondary | ICD-10-CM

## 2022-03-18 DIAGNOSIS — E785 Hyperlipidemia, unspecified: Secondary | ICD-10-CM

## 2022-03-18 DIAGNOSIS — R7989 Other specified abnormal findings of blood chemistry: Secondary | ICD-10-CM | POA: Diagnosis not present

## 2022-03-18 LAB — POCT GLYCOSYLATED HEMOGLOBIN (HGB A1C): HbA1c, POC (controlled diabetic range): 7.3 % — AB (ref 0.0–7.0)

## 2022-03-18 LAB — GLUCOSE, POCT (MANUAL RESULT ENTRY): POC Glucose: 127 mg/dl — AB (ref 70–99)

## 2022-03-18 MED ORDER — GLIMEPIRIDE 4 MG PO TABS
4.0000 mg | ORAL_TABLET | Freq: Every day | ORAL | 6 refills | Status: DC
  Filled 2022-03-18: qty 30, 30d supply, fill #0
  Filled 2022-05-28: qty 30, 30d supply, fill #1
  Filled 2022-07-08: qty 30, 30d supply, fill #2
  Filled 2022-08-11: qty 30, 30d supply, fill #3
  Filled 2022-09-23: qty 30, 30d supply, fill #4
  Filled 2022-11-08: qty 30, 30d supply, fill #5
  Filled 2022-12-23: qty 30, 30d supply, fill #6

## 2022-03-18 MED ORDER — ATORVASTATIN CALCIUM 80 MG PO TABS
ORAL_TABLET | Freq: Every day | ORAL | 6 refills | Status: DC
  Filled 2022-03-18: qty 30, 30d supply, fill #0
  Filled 2022-05-06: qty 30, 30d supply, fill #1
  Filled 2022-06-04: qty 30, 30d supply, fill #2
  Filled 2022-07-05: qty 30, 30d supply, fill #3
  Filled 2022-08-11: qty 30, 30d supply, fill #4
  Filled 2022-09-23: qty 30, 30d supply, fill #5
  Filled 2022-11-08: qty 30, 30d supply, fill #6

## 2022-03-18 MED ORDER — FUROSEMIDE 20 MG PO TABS
20.0000 mg | ORAL_TABLET | ORAL | 3 refills | Status: DC
  Filled 2022-03-18: qty 15, 30d supply, fill #0
  Filled 2022-07-08: qty 15, 30d supply, fill #1

## 2022-03-18 MED ORDER — AMLODIPINE BESYLATE 5 MG PO TABS
5.0000 mg | ORAL_TABLET | Freq: Every day | ORAL | 6 refills | Status: DC
  Filled 2022-03-18: qty 30, 30d supply, fill #0

## 2022-03-18 MED ORDER — ISOSORBIDE MONONITRATE ER 30 MG PO TB24
60.0000 mg | ORAL_TABLET | Freq: Every day | ORAL | 6 refills | Status: DC
  Filled 2022-03-18: qty 60, 30d supply, fill #0
  Filled 2022-05-06: qty 60, 30d supply, fill #1
  Filled 2022-05-28: qty 60, 30d supply, fill #2
  Filled 2022-07-05: qty 60, 30d supply, fill #3
  Filled 2022-08-11: qty 60, 30d supply, fill #4
  Filled 2022-09-23: qty 60, 30d supply, fill #5
  Filled 2022-11-08: qty 60, 30d supply, fill #6

## 2022-03-18 MED ORDER — METFORMIN HCL 500 MG PO TABS
1000.0000 mg | ORAL_TABLET | Freq: Two times a day (BID) | ORAL | 6 refills | Status: DC
  Filled 2022-03-18: qty 120, 30d supply, fill #0
  Filled 2022-05-06: qty 120, 30d supply, fill #1
  Filled 2022-05-28: qty 120, 30d supply, fill #2
  Filled 2022-07-05: qty 120, 30d supply, fill #3
  Filled 2022-08-11: qty 120, 30d supply, fill #4
  Filled 2022-09-23: qty 120, 30d supply, fill #5
  Filled 2022-11-08: qty 120, 30d supply, fill #6

## 2022-03-18 MED ORDER — TRULICITY 1.5 MG/0.5ML ~~LOC~~ SOAJ
1.5000 mg | SUBCUTANEOUS | 6 refills | Status: DC
  Filled 2022-03-18: qty 2, 28d supply, fill #0
  Filled 2022-08-11: qty 2, 28d supply, fill #1
  Filled 2022-09-23: qty 2, 28d supply, fill #2
  Filled 2023-03-17: qty 2, 28d supply, fill #3

## 2022-03-18 MED ORDER — LISINOPRIL 20 MG PO TABS
20.0000 mg | ORAL_TABLET | Freq: Every day | ORAL | 6 refills | Status: DC
  Filled 2022-03-18: qty 30, 30d supply, fill #0
  Filled 2022-05-28: qty 30, 30d supply, fill #1
  Filled 2022-07-05: qty 30, 30d supply, fill #2

## 2022-03-18 MED ORDER — METOPROLOL TARTRATE 100 MG PO TABS
ORAL_TABLET | Freq: Two times a day (BID) | ORAL | 6 refills | Status: DC
  Filled 2022-03-18: qty 60, 30d supply, fill #0
  Filled 2022-05-06: qty 60, 30d supply, fill #1
  Filled 2022-06-04: qty 60, 30d supply, fill #2
  Filled 2022-07-08: qty 60, 30d supply, fill #3
  Filled 2022-08-11: qty 60, 30d supply, fill #4
  Filled 2022-09-23: qty 60, 30d supply, fill #5
  Filled 2022-11-08: qty 60, 30d supply, fill #6

## 2022-03-18 NOTE — Patient Instructions (Signed)
Your provider has requested that you go to the basement level for lab work before leaving today. Press "B" on the elevator. The lab is located at the first door on the left as you exit the elevator. ? ?If you are age 64 or younger, your body mass index should be between 19-25. Your Body mass index is 39.69 kg/m?Marland Kitchen If this is out of the aformentioned range listed, please consider follow up with your Primary Care Provider.  ? ?________________________________________________________ ? ?The [facility] providers would like to encourage you to use [facility] to communicate with providers for non-urgent requests or questions.  Due to long hold times on the telephone, sending your provider a message by [facility] may be a faster and more efficient way to get a response.  Please allow 48 business hours for a response.  Please remember that this is for non-urgent requests.  ?_______________________________________________________ ? ? ?

## 2022-03-18 NOTE — Progress Notes (Signed)
Agree with assessment and plan as outlined. Hopefully she can get insurance at some point in the near future and once recovered from diverticulitis and her other acute issues she can call and schedule colonoscopy at her convenience, she is very overdue for this. ?

## 2022-03-18 NOTE — Telephone Encounter (Signed)
Met with the patient and her daughter, [name], when she was in the office today for her appointment with Dr Margarita Rana. ?[name] explained that her mother has applied for Medicaid multiple times  and has been denied. She has tried to contact [employer] but has not been able to reach anyone  Her mother has been approved for disability but she does not believe that should have influenced the Medicaid denial. ?[name] went on to explain that her mother is in need for pills to treat her cancer and she is not able to afford the medication without insurance coverage. The pills cost $20,000.  ?The patient and her daughter were in agreement to placing a referral to [employer] [place] for assistance.  The referral was then sent to [name], attorney Alethia Berthold as an urgent request.   ?

## 2022-03-18 NOTE — Patient Instructions (Signed)
Exercising to Stay Healthy °To become healthy and stay healthy, it is recommended that you do moderate-intensity and vigorous-intensity exercise. You can tell that you are exercising at a moderate intensity if your heart starts beating faster and you start breathing faster but can still hold a conversation. You can tell that you are exercising at a vigorous intensity if you are breathing much harder and faster and cannot hold a conversation while exercising. °How can exercise benefit me? °Exercising regularly is important. It has many health benefits, such as: °Improving overall fitness, flexibility, and endurance. °Increasing bone density. °Helping with weight control. °Decreasing body fat. °Increasing muscle strength and endurance. °Reducing stress and tension, anxiety, depression, or anger. °Improving overall health. °What guidelines should I follow while exercising? °Before you start a new exercise program, talk with your health care provider. °Do not exercise so much that you hurt yourself, feel dizzy, or get very short of breath. °Wear comfortable clothes and wear shoes with good support. °Drink plenty of water while you exercise to prevent dehydration or heat stroke. °Work out until your breathing and your heartbeat get faster (moderate intensity). °How often should I exercise? °Choose an activity that you enjoy, and set realistic goals. Your health care provider can help you make an activity plan that is individually designed and works best for you. °Exercise regularly as told by your health care provider. This may include: °Doing strength training two times a week, such as: °Lifting weights. °Using resistance bands. °Push-ups. °Sit-ups. °Yoga. °Doing a certain intensity of exercise for a given amount of time. Choose from these options: °A total of 150 minutes of moderate-intensity exercise every week. °A total of 75 minutes of vigorous-intensity exercise every week. °A mix of moderate-intensity and  vigorous-intensity exercise every week. °Children, pregnant women, people who have not exercised regularly, people who are overweight, and older adults may need to talk with a health care provider about what activities are safe to perform. If you have a medical condition, be sure to talk with your health care provider before you start a new exercise program. °What are some exercise ideas? °Moderate-intensity exercise ideas include: °Walking 1 mile (1.6 km) in about 15 minutes. °Biking. °Hiking. °Golfing. °Dancing. °Water aerobics. °Vigorous-intensity exercise ideas include: °Walking 4.5 miles (7.2 km) or more in about 1 hour. °Jogging or running 5 miles (8 km) in about 1 hour. °Biking 10 miles (16.1 km) or more in about 1 hour. °Lap swimming. °Roller-skating or in-line skating. °Cross-country skiing. °Vigorous competitive sports, such as football, basketball, and soccer. °Jumping rope. °Aerobic dancing. °What are some everyday activities that can help me get exercise? °Yard work, such as: °Pushing a lawn mower. °Raking and bagging leaves. °Washing your car. °Pushing a stroller. °Shoveling snow. °Gardening. °Washing windows or floors. °How can I be more active in my day-to-day activities? °Use stairs instead of an elevator. °Take a walk during your lunch break. °If you drive, park your car farther away from your work or school. °If you take public transportation, get off one stop early and walk the rest of the way. °Stand up or walk around during all of your indoor phone calls. °Get up, stretch, and walk around every 30 minutes throughout the day. °Enjoy exercise with a friend. Support to continue exercising will help you keep a regular routine of activity. °Where to find more information °You can find more information about exercising to stay healthy from: °U.S. Department of Health and Human Services: www.hhs.gov °Centers for Disease Control and Prevention (  CDC): www.cdc.gov °Summary °Exercising regularly is  important. It will improve your overall fitness, flexibility, and endurance. °Regular exercise will also improve your overall health. It can help you control your weight, reduce stress, and improve your bone density. °Do not exercise so much that you hurt yourself, feel dizzy, or get very short of breath. °Before you start a new exercise program, talk with your health care provider. °This information is not intended to replace advice given to you by your health care provider. Make sure you discuss any questions you have with your health care provider. °Document Revised: 03/30/2021 Document Reviewed: 03/30/2021 °Elsevier Patient Education © 2022 [employer] ° °

## 2022-03-18 NOTE — Progress Notes (Signed)
? ? ? ?03/18/2022 ?[name] ?[id number] ?Jan 15, 1956 ? ? ?HISTORY OF PRESENT ILLNESS: This is a 66 year old female who is a patient of Dr. Doyne Keel, assigned to him during an office visit in 2017.  She is actually here today for hospital follow-up.  She was seen by our service during recent hospitalization at which time she was evaluated for elevated LFTs as well as complaints of diarrhea and abdominal pain.  She was found to have diverticulitis by CT scan and was treated for that.  Elevated LFTs were thought to be due to the antibiotics that she received for the diverticulitis and possibly due to her COVID infection, which was otherwise asymptomatic.  She also has uterine cancer and received radiation treatments in August 2022.  She had been on chemo recently, but her daughter who is with her today tells me that they are taking a break from that since that has caused a lot of adverse side effects and issues recently.  Patient reports family history of colon cancer in both her mother and father.  She has never had a colonoscopy.  She is no longer having diarrhea.  No rectal pain.  She reports some intermittent abdominal pain for which she takes Tylenol and that seems to help.  Daughter reports that she thinks her pain is more due to the cancer as she has also had some vaginal bleeding recently. ? ?Last LFTs were checked on 02/17/2022 at which time AST was 37, ALT 67, alk phos 313, total bili 0.3. ? ? ?Past Medical History:  ?Diagnosis Date  ? Anginal pain ([place]) 2016  ? Cancer [facility])   ? Coronary atherosclerosis of native coronary artery, with stent to LCX in 2006 06/04/2013  ? Diabetes mellitus without complication ([place])   ? GERD (gastroesophageal reflux disease)   ? Heart attack [facility]) 2006  ? History of radiation therapy 08/16/2021  ? 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  Dr Gery Pray  ? Hyperlipidemia LDL goal < 70 06/07/2013  ? Hypertension   ? Obesity   ? S/P coronary artery stent placement to RCA with  PROMUS DES, 06/07/13, residual disease on OM1, OM2 and rPDA 06/07/2013  ? ?Past Surgical History:  ?Procedure Laterality Date  ? CARDIAC CATHETERIZATION N/A 08/22/2015  ? Procedure: Left Heart Cath and Coronary Angiography;  Surgeon: Leonie Man, MD;  Location: [place] [facility];  Service: Cardiovascular;  Laterality: N/A;  ? CARDIAC CATHETERIZATION N/A 08/22/2015  ? Procedure: Coronary Stent Intervention;  Surgeon: Leonie Man, MD;  Location: [place] [facility];  Service: Cardiovascular;  Laterality: N/A;  ? CARDIAC CATHETERIZATION N/A 08/22/2015  ? Procedure: Left Heart Cath and Coronary Angiography;  Surgeon: Leonie Man, MD;  Location: [place] [facility];  Service: Cardiovascular;  Laterality: N/A;  ? CORONARY STENT PLACEMENT  2006  ? TAXUS stent CFX in [facility]  ? IR IMAGING GUIDED PORT INSERTION  02/20/2021  ? LEFT HEART CATH AND CORONARY ANGIOGRAPHY N/A 07/21/2018  ? Procedure: LEFT HEART CATH AND CORONARY ANGIOGRAPHY;  Surgeon: Troy Sine, MD;  Location: [place] [facility];  Service: Cardiovascular;  Laterality: N/A;  ? LEFT HEART CATHETERIZATION WITH CORONARY ANGIOGRAM N/A 06/07/2013  ? Procedure: LEFT HEART CATHETERIZATION WITH CORONARY ANGIOGRAM;  Surgeon: Sanda Klein, MD;  Location: [facility] CATH LAB;  Service: Cardiovascular;  Laterality: N/A;  ? LYMPH NODE DISSECTION N/A 01/29/2021  ? Procedure: SELECTIVE LYMPH NODE DISSECTION;  Surgeon: Lafonda Mosses, MD;  Location: [facility];  Service: Gynecology;  Laterality: N/A;  ?  ROBOTIC ASSISTED TOTAL HYSTERECTOMY WITH BILATERAL SALPINGO OOPHERECTOMY Bilateral 01/29/2021  ? Procedure: XI ROBOTIC ASSISTED TOTAL HYSTERECTOMY WITH BILATERAL SALPINGO OOPHORECTOMY,;  Surgeon: Lafonda Mosses, MD;  Location: [facility];  Service: Gynecology;  Laterality: Bilateral;  ? SENTINEL NODE BIOPSY N/A 01/29/2021  ? Procedure: SENTINEL NODE BIOPSY;  Surgeon: Lafonda Mosses, MD;  [facility]: [facility];  Service: Gynecology;  Laterality: N/A;  ? ? reports that she quit  smoking about 17 years ago. Her smoking use included cigars. She has never used smokeless tobacco. She reports current alcohol use. She reports that she does not use drugs. ?family history includes Colon cancer in her father; Diabetes in her brother, mother, and sister; Hypertension in her brother and mother. ?No Known Allergies ? ?  ?Outpatient Encounter Medications as of 03/18/2022  ?Medication Sig  ? amLODipine (NORVASC) 5 MG tablet Take 1 tablet by mouth daily.  ? aspirin 81 MG EC tablet Take 1 tablet (81 mg total) by mouth daily.  ? atorvastatin (LIPITOR) 80 MG tablet TAKE 1 TABLET (80 MG TOTAL) BY MOUTH AT BEDTIME.  ? calcium carbonate (TUMS - DOSED IN MG ELEMENTAL CALCIUM) 500 MG chewable tablet Chew 1 tablet by mouth daily.  ? cholestyramine light (PREVALITE) 4 g packet Dissolve in water as directed then take 1 packet by mouth daily at 12 noon.  ? Dulaglutide (TRULICITY) 1.5 ZO/1.0RU SOPN Inject 1.5 mg into the skin once a week.  ? furosemide (LASIX) 20 MG tablet Take 1 tablet (20 mg total) by mouth every other day.  ? glimepiride (AMARYL) 4 MG tablet Take 1 tablet (4 mg total) by mouth daily with breakfast.  ? hydrocortisone (ANUSOL-HC) 2.5 % rectal cream Place rectally as directed 2 times daily.  ? isosorbide mononitrate (IMDUR) 30 MG 24 hr tablet Take 2 tablets (60 mg total) by mouth daily.  ? lisinopril (ZESTRIL) 20 MG tablet TAKE 1 TABLET BY MOUTH ONCE A DAY  ? liver oil-zinc oxide (DESITIN) 40 % ointment Apply 1 application topically as needed for irritation.  ? metFORMIN (GLUCOPHAGE) 500 MG tablet Take 2 tablets (1,000 mg total) by mouth 2 (two) times daily with a meal.  ? metoprolol tartrate (LOPRESSOR) 100 MG tablet TAKE 1 TABLET (100 MG TOTAL) BY MOUTH 2 (TWO) TIMES DAILY.  ? nitroGLYCERIN (NITROSTAT) 0.4 MG SL tablet Place 1 tablet (0.4 mg total) under the tongue every 5 (five) minutes as needed for chest pain.  ? saccharomyces boulardii (FLORASTOR) 250 MG capsule Take 1 capsule (250 mg total) by  mouth 2 (two) times daily.  ? ondansetron (ZOFRAN) 8 MG tablet Take 1 tablet by mouth 2  times daily as needed for refractory nausea / vomiting. Start on day 3 after carboplatin chemo. (Patient not taking: Reported on 03/18/2022)  ? pantoprazole (PROTONIX) 40 MG tablet Take 1 tablet (40 mg total) by mouth daily.  ? prochlorperazine (COMPAZINE) 10 MG tablet Take 1 tablet by mouth every 6  hours as needed (Nausea or vomiting). (Patient not taking: Reported on 03/18/2022)  ? [DISCONTINUED] docusate sodium (COLACE) 100 MG capsule Take 100 mg by mouth daily as needed for mild constipation. (Patient not taking: Reported on 03/18/2022)  ? [DISCONTINUED] loperamide (IMODIUM) 2 MG capsule Take 1 capsule by mouth 2 times daily. (Patient not taking: Reported on 03/18/2022)  ? [DISCONTINUED] morphine (MSIR) 15 MG tablet Take 1 tablet (15 mg total) by mouth every 4 (four) hours as needed for severe pain. (Patient not taking: Reported on 03/18/2022)  ? ?No facility-administered encounter  medications on file as of 03/18/2022.  ? ? ? ?REVIEW OF SYSTEMS  : All other systems reviewed and negative except where noted in the History of Present Illness. ? ? ?PHYSICAL EXAM: ?BP 122/80 (BP Location: Right Arm, Patient Position: Sitting, Cuff Size: Normal)   Pulse 88   Ht '5\' 2"'  (1.575 m)   Wt 217 lb (98.4 kg)   SpO2 97%   BMI 39.69 kg/m?  ?General: Well developed female in no acute distress ?Head: Normocephalic and atraumatic ?Eyes:  Sclerae anicteric, conjunctiva pink. ?Ears: Normal auditory acuity ?Lungs: Clear throughout to auscultation; no W/R/R. ?Heart: Regular rate and rhythm; no M/R/G. ?Abdomen: Soft, non-distended.  BS present.  Non-tender. ?Musculoskeletal: Symmetrical with no gross deformities  ?Skin: No lesions on visible extremities ?Extremities: No edema  ?Neurological: Alert oriented x 4, grossly non-focal ?Psychological:  Alert and cooperative. Normal mood and affect ? ?ASSESSMENT AND PLAN: ?1.  Elevated LFTs: Thought likely Dili  given recent antibiotics +/- COVID but acute hep panel is negative.  No repeat LFTs in about a month.   ?2.  Acute diverticulitis: Treated/completed.  Currently having mild intermittent abdominal pains for which she ta

## 2022-03-18 NOTE — Progress Notes (Signed)
? ?Subjective:  ?Patient ID: [name], female    DOB: 1956-11-06  Age: 66 y.o. MRN: [id number] ? ?CC: Diabetes ? ? ?HPI ?KESHARA KIGER is a 66 y.o. year old female with a history of type 2 diabetes mellitus (A1c 7.3), dyslipidemia, coronary artery disease status post  DES stent (in 08/2015 completed anticoagulation with Brilinta and aspirin for 12 months, restarted on Plavix by cardiology due to multiple stents and occlusion from recent cardiac cath), diastolic heart failure (EF 60-65% LVH, grade 1 DD, moderate hypokinesis of left ventricular basal septal wall and inferior wall from 02/2021), essential hypertension, COVID-19 is 11/2020 s/p MAB infusion, Adenocarcinoma of Endometrium status post hysterectomy, status post radiation and chemo) ?Hospitalized in 01/2022 for diverticulitis, COVID and pancytopenia requiring transfusion secondary to chemo. ? ?Interval History: ?She has been unable to get Medicaid as she has been denied twice and this has been a limitation to initiating immunotherapy.  Daughter. ?IV chemo has been tough on her with hospitalizations as a result, reduced sleep, reduced appetite . Last chemo was in Feb. Feels energy is good today and is getting back to her baseline. ?Last oncology visit was 3 weeks ago and notes reviewed.  Supportive therapy at the moment and consideration for immunotherapy once she has Medicaid. ? ?She started spotting again noticed when she wipes and seen on the sanitary pad which she has to wear. ?She has no dysuria or hematuria.  She has not been to see GYN oncology in a while. ?CT abdomen and pelvis from 02/04/2022 revealed: ?IMPRESSION: ?1. Acute uncomplicated sigmoid diverticulitis. ?2. Prior hysterectomy with stable prominence at the vaginal cuff, ?indeterminate. Stable mildly enlarged left pelvic sidewall lymph ?node. ? ? ?She does have some dyspnea but no chest pain.  Has not been to see cardiology in a while. ?Of note she has gained about 7 pounds in the last 3  weeks.  Daughter attributes this to eating out a lot as they are currently in between homes but she promises that once she settled down she will start cooking again ?Past Medical History:  ?Diagnosis Date  ? Anginal pain ([place]) 2016  ? Cancer [facility])   ? Coronary atherosclerosis of native coronary artery, with stent to LCX in 2006 06/04/2013  ? Diabetes mellitus without complication ([place])   ? GERD (gastroesophageal reflux disease)   ? Heart attack [facility]) 2006  ? History of radiation therapy 08/16/2021  ? 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  Dr Gery Pray  ? Hyperlipidemia LDL goal < 70 06/07/2013  ? Hypertension   ? Obesity   ? S/P coronary artery stent placement to RCA with PROMUS DES, 06/07/13, residual disease on OM1, OM2 and rPDA 06/07/2013  ? ? ?Past Surgical History:  ?Procedure Laterality Date  ? CARDIAC CATHETERIZATION N/A 08/22/2015  ? Procedure: Left Heart Cath and Coronary Angiography;  Surgeon: Leonie Man, MD;  Location: [place] [facility];  Service: Cardiovascular;  Laterality: N/A;  ? CARDIAC CATHETERIZATION N/A 08/22/2015  ? Procedure: Coronary Stent Intervention;  Surgeon: Leonie Man, MD;  Location: [place] [facility];  Service: Cardiovascular;  Laterality: N/A;  ? CARDIAC CATHETERIZATION N/A 08/22/2015  ? Procedure: Left Heart Cath and Coronary Angiography;  Surgeon: Leonie Man, MD;  Location: [place] [facility];  Service: Cardiovascular;  Laterality: N/A;  ? CORONARY STENT PLACEMENT  2006  ? TAXUS stent CFX in [facility]  ? IR IMAGING GUIDED PORT INSERTION  02/20/2021  ? LEFT HEART CATH AND CORONARY ANGIOGRAPHY N/A 07/21/2018  ?  Procedure: LEFT HEART CATH AND CORONARY ANGIOGRAPHY;  Surgeon: Troy Sine, MD;  Location: [place] [facility];  Service: Cardiovascular;  Laterality: N/A;  ? LEFT HEART CATHETERIZATION WITH CORONARY ANGIOGRAM N/A 06/07/2013  ? Procedure: LEFT HEART CATHETERIZATION WITH CORONARY ANGIOGRAM;  Surgeon: Sanda Klein, MD;  Location: [facility] CATH LAB;  Service: Cardiovascular;   Laterality: N/A;  ? LYMPH NODE DISSECTION N/A 01/29/2021  ? Procedure: SELECTIVE LYMPH NODE DISSECTION;  Surgeon: Lafonda Mosses, MD;  [facility];  Service: Gynecology;  Laterality: N/A;  ? ROBOTIC ASSISTED TOTAL HYSTERECTOMY WITH BILATERAL SALPINGO OOPHERECTOMY Bilateral 01/29/2021  ? Procedure: XI ROBOTIC ASSISTED TOTAL HYSTERECTOMY WITH BILATERAL SALPINGO OOPHORECTOMY,;  Surgeon: Lafonda Mosses, MD;  Location: [facility];  Service: Gynecology;  Laterality: Bilateral;  ? SENTINEL NODE BIOPSY N/A 01/29/2021  ? Procedure: SENTINEL NODE BIOPSY;  Surgeon: Lafonda Mosses, MD;  [facility];  Service: Gynecology;  Laterality: N/A;  ? ? ?Family History  ?Problem Relation Age of Onset  ? Colon cancer Mother   ? Diabetes Mother   ? Hypertension Mother   ? Colon cancer Father   ? Diabetes Sister   ? Diabetes Brother   ? Hypertension Brother   ? Breast cancer Neg Hx   ? Ovarian cancer Neg Hx   ? Endometrial cancer Neg Hx   ? Prostate cancer Neg Hx   ? Pancreatic cancer Neg Hx   ? ? ?Social History  ? ?Socioeconomic History  ? Marital status: Divorced  ?  Spouse name: Not on file  ? Number of children: 1  ? Years of education: Not on file  ? Highest education level: Not on file  ?Occupational History  ? Occupation: Scientist, water quality  ?Tobacco Use  ? Smoking status: Former  ?  Types: Cigars  ?  Quit date: 06/23/2004  ?  Years since quitting: 17.7  ? Smokeless tobacco: Never  ? Tobacco comments:  ?  "a few black and milds a day" CigrettesX 30 years  ?Vaping Use  ? Vaping Use: Never used  ?Substance and Sexual Activity  ? Alcohol use: Yes  ?  Comment: occ  ? Drug use: No  ? Sexual activity: Not Currently  ?Other Topics Concern  ? Not on file  ?Social History Narrative  ? Not on file  ? ?Social Determinants of Health  ? ?Financial Resource Strain: Not on file  ?Food Insecurity: Not on file  ?Transportation Needs: Not on file  ?Physical Activity: Not on file  ?Stress: Not on file  ?Social Connections: Not on file  ? ? ?No  Known Allergies ? ?Outpatient Medications Prior to Visit  ?Medication Sig Dispense Refill  ? aspirin 81 MG EC tablet Take 1 tablet (81 mg total) by mouth daily. 30 tablet 12  ? calcium carbonate (TUMS - DOSED IN MG ELEMENTAL CALCIUM) 500 MG chewable tablet Chew 1 tablet by mouth daily.    ? cholestyramine light (PREVALITE) 4 g packet Dissolve in water as directed then take 1 packet by mouth daily at 12 noon. 30 each 0  ? hydrocortisone (ANUSOL-HC) 2.5 % rectal cream Place rectally as directed 2 times daily. 30 g 0  ? liver oil-zinc oxide (DESITIN) 40 % ointment Apply 1 application topically as needed for irritation.    ? nitroGLYCERIN (NITROSTAT) 0.4 MG SL tablet Place 1 tablet (0.4 mg total) under the tongue every 5 (five) minutes as needed for chest pain. 25 tablet 2  ? ondansetron (ZOFRAN) 8 MG tablet Take 1 tablet  by mouth 2  times daily as needed for refractory nausea / vomiting. Start on day 3 after carboplatin chemo. (Patient not taking: Reported on 03/18/2022) 30 tablet 1  ? prochlorperazine (COMPAZINE) 10 MG tablet Take 1 tablet by mouth every 6  hours as needed (Nausea or vomiting). (Patient not taking: Reported on 03/18/2022) 30 tablet 1  ? saccharomyces boulardii (FLORASTOR) 250 MG capsule Take 1 capsule (250 mg total) by mouth 2 (two) times daily. 60 capsule 0  ? amLODipine (NORVASC) 5 MG tablet Take 1 tablet by mouth daily. 30 tablet 0  ? atorvastatin (LIPITOR) 80 MG tablet TAKE 1 TABLET (80 MG TOTAL) BY MOUTH AT BEDTIME. 30 tablet 6  ? Dulaglutide (TRULICITY) 1.5 XV/4.0GQ SOPN Inject 1.5 mg into the skin once a week. (Patient taking differently: Inject 1.5 mg into the skin once a week. Friday/Saturday) 2 mL 2  ? furosemide (LASIX) 20 MG tablet Take 1 tablet (20 mg total) by mouth every other day. 30 tablet   ? glimepiride (AMARYL) 4 MG tablet Take 1 tablet (4 mg total) by mouth daily with breakfast. 30 tablet 6  ? lisinopril (ZESTRIL) 20 MG tablet TAKE 1 TABLET BY MOUTH ONCE A DAY 30 tablet 2  ?  metoprolol tartrate (LOPRESSOR) 100 MG tablet TAKE 1 TABLET (100 MG TOTAL) BY MOUTH 2 (TWO) TIMES DAILY. 60 tablet 2  ? pantoprazole (PROTONIX) 40 MG tablet Take 1 tablet (40 mg total) by mouth daily. 30 tablet 1  ?

## 2022-03-18 NOTE — Progress Notes (Signed)
Vaginal bleeding. ?

## 2022-03-19 ENCOUNTER — Telehealth: Payer: Self-pay | Admitting: *Deleted

## 2022-03-19 ENCOUNTER — Encounter: Payer: Self-pay | Admitting: Hematology and Oncology

## 2022-03-19 NOTE — Telephone Encounter (Signed)
Spoke with the patient and scheduled a follow up appt on 4/14 ?

## 2022-03-25 ENCOUNTER — Other Ambulatory Visit (HOSPITAL_COMMUNITY): Payer: Self-pay

## 2022-03-26 ENCOUNTER — Encounter: Payer: Self-pay | Admitting: Hematology and Oncology

## 2022-03-26 ENCOUNTER — Inpatient Hospital Stay: Payer: Medicaid Other | Attending: Gynecologic Oncology

## 2022-03-26 ENCOUNTER — Inpatient Hospital Stay (HOSPITAL_BASED_OUTPATIENT_CLINIC_OR_DEPARTMENT_OTHER): Payer: Medicaid Other | Admitting: Hematology and Oncology

## 2022-03-26 ENCOUNTER — Other Ambulatory Visit: Payer: Self-pay

## 2022-03-26 VITALS — BP 138/65 | HR 75 | Temp 97.4°F | Resp 18 | Ht 62.0 in | Wt 215.4 lb

## 2022-03-26 DIAGNOSIS — Z7985 Long-term (current) use of injectable non-insulin antidiabetic drugs: Secondary | ICD-10-CM | POA: Insufficient documentation

## 2022-03-26 DIAGNOSIS — Z7984 Long term (current) use of oral hypoglycemic drugs: Secondary | ICD-10-CM | POA: Diagnosis not present

## 2022-03-26 DIAGNOSIS — Z6841 Body Mass Index (BMI) 40.0 and over, adult: Secondary | ICD-10-CM | POA: Insufficient documentation

## 2022-03-26 DIAGNOSIS — Z9221 Personal history of antineoplastic chemotherapy: Secondary | ICD-10-CM | POA: Diagnosis not present

## 2022-03-26 DIAGNOSIS — D649 Anemia, unspecified: Secondary | ICD-10-CM | POA: Insufficient documentation

## 2022-03-26 DIAGNOSIS — Z79899 Other long term (current) drug therapy: Secondary | ICD-10-CM | POA: Insufficient documentation

## 2022-03-26 DIAGNOSIS — N939 Abnormal uterine and vaginal bleeding, unspecified: Secondary | ICD-10-CM | POA: Diagnosis not present

## 2022-03-26 DIAGNOSIS — E1165 Type 2 diabetes mellitus with hyperglycemia: Secondary | ICD-10-CM | POA: Diagnosis not present

## 2022-03-26 DIAGNOSIS — Z90722 Acquired absence of ovaries, bilateral: Secondary | ICD-10-CM | POA: Insufficient documentation

## 2022-03-26 DIAGNOSIS — C541 Malignant neoplasm of endometrium: Secondary | ICD-10-CM | POA: Insufficient documentation

## 2022-03-26 DIAGNOSIS — E669 Obesity, unspecified: Secondary | ICD-10-CM | POA: Diagnosis not present

## 2022-03-26 DIAGNOSIS — Z90710 Acquired absence of both cervix and uterus: Secondary | ICD-10-CM | POA: Diagnosis not present

## 2022-03-26 DIAGNOSIS — Z923 Personal history of irradiation: Secondary | ICD-10-CM | POA: Diagnosis not present

## 2022-03-26 DIAGNOSIS — D61818 Other pancytopenia: Secondary | ICD-10-CM

## 2022-03-26 LAB — CBC WITH DIFFERENTIAL (CANCER CENTER ONLY)
Abs Immature Granulocytes: 0.02 10*3/uL (ref 0.00–0.07)
Basophils Absolute: 0 10*3/uL (ref 0.0–0.1)
Basophils Relative: 0 %
Eosinophils Absolute: 0 10*3/uL (ref 0.0–0.5)
Eosinophils Relative: 1 %
HCT: 28.7 % — ABNORMAL LOW (ref 36.0–46.0)
Hemoglobin: 9.6 g/dL — ABNORMAL LOW (ref 12.0–15.0)
Immature Granulocytes: 0 %
Lymphocytes Relative: 25 %
Lymphs Abs: 1.5 10*3/uL (ref 0.7–4.0)
MCH: 29.5 pg (ref 26.0–34.0)
MCHC: 33.4 g/dL (ref 30.0–36.0)
MCV: 88.3 fL (ref 80.0–100.0)
Monocytes Absolute: 0.6 10*3/uL (ref 0.1–1.0)
Monocytes Relative: 10 %
Neutro Abs: 3.9 10*3/uL (ref 1.7–7.7)
Neutrophils Relative %: 64 %
Platelet Count: 237 10*3/uL (ref 150–400)
RBC: 3.25 MIL/uL — ABNORMAL LOW (ref 3.87–5.11)
RDW: 18.2 % — ABNORMAL HIGH (ref 11.5–15.5)
WBC Count: 6.1 10*3/uL (ref 4.0–10.5)
nRBC: 0 % (ref 0.0–0.2)

## 2022-03-26 LAB — CMP (CANCER CENTER ONLY)
ALT: 23 U/L (ref 0–44)
AST: 21 U/L (ref 15–41)
Albumin: 3.5 g/dL (ref 3.5–5.0)
Alkaline Phosphatase: 106 U/L (ref 38–126)
Anion gap: 9 (ref 5–15)
BUN: 14 mg/dL (ref 8–23)
CO2: 27 mmol/L (ref 22–32)
Calcium: 9.2 mg/dL (ref 8.9–10.3)
Chloride: 102 mmol/L (ref 98–111)
Creatinine: 1.01 mg/dL — ABNORMAL HIGH (ref 0.44–1.00)
GFR, Estimated: >60 mL/min (ref >60)
Glucose, Bld: 247 mg/dL — ABNORMAL HIGH (ref 70–99)
Potassium: 3.9 mmol/L (ref 3.5–5.1)
Sodium: 138 mmol/L (ref 135–145)
Total Bilirubin: 0.4 mg/dL (ref 0.3–1.2)
Total Protein: 7.4 g/dL (ref 6.5–8.1)

## 2022-03-26 LAB — SAMPLE TO BLOOD BANK

## 2022-03-26 MED ORDER — SODIUM CHLORIDE 0.9% FLUSH
10.0000 mL | Freq: Once | INTRAVENOUS | Status: AC
  Administered 2022-03-26: 10 mL

## 2022-03-26 MED ORDER — HEPARIN SOD (PORK) LOCK FLUSH 100 UNIT/ML IV SOLN
500.0000 [IU] | Freq: Once | INTRAVENOUS | Status: AC
  Administered 2022-03-26: 500 [IU]

## 2022-03-26 NOTE — Assessment & Plan Note (Signed)
The patient have class II obesity and poorly controlled diabetes ?We discussed importance of aggressive lifestyle changes and dietary modification ?

## 2022-03-26 NOTE — Assessment & Plan Note (Signed)
She has improved dramatically over the past month ?She is eating better, felt great and improvement overall ?We will order CT imaging next month for further follow-up ?Overall, I do not believe she tolerated carboplatin, paclitaxel and trastuzumab despite improvement of disease control ?If she had residual disease or recurrent disease on next CT imaging, I would consider giving her combination therapy pembrolizumab with Lenvima ?

## 2022-03-26 NOTE — Assessment & Plan Note (Signed)
She has intermittent scant vaginal bleeding ?She has appointment pending to see GYN surgeon for evaluation ?She has mild anemia but overall stable ?

## 2022-03-26 NOTE — Progress Notes (Signed)
[place] ?OFFICE PROGRESS NOTE ? ?Patient Care Team: ?[name], MD as PCP - General (Family Medicine) ?Croitoru, Dani Gobble, MD as PCP - Cardiology (Cardiology) ? 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 ?Vaginal spotting ?She has intermittent scant vaginal bleeding ?She has appointment pending to see GYN surgeon for evaluation ?She has mild anemia but overall stable ? ?Uncontrolled type 2 diabetes mellitus with hyperglycemia, without long-term current use of insulin ([place]) ?The patient have class II obesity and poorly controlled diabetes ?We discussed importance of aggressive lifestyle changes and dietary modification ? ?Orders Placed This Encounter  ?Procedures  ? CT CHEST ABDOMEN PELVIS W CONTRAST  ?  Standing Status:   Future  ?  Standing Expiration Date:   03/27/2023  ?  Order Specific Question:   Preferred imaging location?  ?  Answer:   [facility]  ?  Order Specific Question:   Radiology Contrast Protocol - do NOT remove file path  ?  Answer:   [email or web address]  ? ? ?All questions were answered. The patient knows to call the clinic with any problems, questions or concerns. ?The total time spent in the appointment was 20 minutes encounter with patients including review of chart and various tests results, discussions about plan of care and coordination of care plan ?  ?Heath Lark, MD ?03/26/2022 2:19 PM ? ?INTERVAL HISTORY: ?Please see below for problem oriented charting. ?she returns  for follow-up for history of recurrent uterine cancer ?She is here accompanied by her daughter ?She felt great since discontinuation of treatment ?She has occasional vaginal spotting, not significant ?Denies pelvic pain ?No changes in bowel habits ? ?REVIEW OF SYSTEMS:   ?Constitutional: Denies fevers, chills or abnormal weight loss ?Eyes: Denies blurriness of vision ?Ears, nose, mouth, throat, and face: Denies mucositis or sore throat ?Respiratory: Denies cough, dyspnea or wheezes ?Cardiovascular: Denies palpitation, chest discomfort or lower extremity swelling ?Gastrointestinal:  Denies nausea, heartburn or change in bowel habits ?Skin: Denies abnormal skin rashes ?Lymphatics: Denies new lymphadenopathy or easy bruising ?Neurological:Denies numbness, tingling or new weaknesses ?Behavioral/Psych: Mood is stable, no new changes  ?All other systems were reviewed with the patient and are negative. ? ?I have reviewed the past medical history, past surgical history, social history and family history with the patient and they are unchanged from previous note. ? ?ALLERGIES:  has No Known Allergies. ? ?MEDICATIONS:  ?Current Outpatient Medications  ?Medication Sig Dispense Refill  ? amLODipine (NORVASC) 5 MG tablet Take 1 tablet by mouth daily. 30 tablet 6  ? aspirin 81 MG EC tablet Take 1 tablet (81 mg total) by mouth daily. 30 tablet 12  ? atorvastatin (LIPITOR) 80 MG tablet TAKE 1 TABLET (80 MG TOTAL) BY MOUTH AT BEDTIME. 30 tablet 6  ? calcium carbonate (TUMS - DOSED IN MG ELEMENTAL CALCIUM) 500 MG chewable tablet Chew 1 tablet by mouth daily.    ? cholestyramine light (PREVALITE) 4 g packet Dissolve in water as directed then take 1 packet by mouth daily at 12 noon. 30 each 0  ? Dulaglutide (TRULICITY) 1.5 EH/6.3JS SOPN Inject 1.5 mg  into the skin once a week. 2 mL 6  ? furosemide (LASIX) 20 MG tablet Take 1 tablet (20 mg total) by mouth every other day. 30 tablet 3  ? glimepiride (AMARYL) 4 MG tablet Take 1 tablet (4  mg total) by mouth daily with breakfast. 30 tablet 6  ? hydrocortisone (ANUSOL-HC) 2.5 % rectal cream Place rectally as directed 2 times daily. 30 g 0  ? isosorbide mononitrate (IMDUR) 30 MG 24 hr tablet Take 2 tablets (60 mg total) by mouth daily. 60 tablet 6  ? lisinopril (ZESTRIL) 20 MG tablet TAKE 1 TABLET BY MOUTH ONCE A DAY 30 tablet 6  ? liver oil-zinc oxide (DESITIN) 40 % ointment Apply 1 application topically as needed for irritation.    ? metFORMIN (GLUCOPHAGE) 500 MG tablet Take 2 tablets (1,000 mg total) by mouth 2 (two) times daily with a meal. 120 tablet 6  ? metoprolol tartrate (LOPRESSOR) 100 MG tablet TAKE 1 TABLET (100 MG TOTAL) BY MOUTH 2 (TWO) TIMES DAILY. 60 tablet 6  ? nitroGLYCERIN (NITROSTAT) 0.4 MG SL tablet Place 1 tablet (0.4 mg total) under the tongue every 5 (five) minutes as needed for chest pain. 25 tablet 2  ? ondansetron (ZOFRAN) 8 MG tablet Take 1 tablet by mouth 2  times daily as needed for refractory nausea / vomiting. Start on day 3 after carboplatin chemo. (Patient not taking: Reported on 03/18/2022) 30 tablet 1  ? pantoprazole (PROTONIX) 40 MG tablet Take 1 tablet (40 mg total) by mouth daily. 30 tablet 1  ? prochlorperazine (COMPAZINE) 10 MG tablet Take 1 tablet by mouth every 6  hours as needed (Nausea or vomiting). (Patient not taking: Reported on 03/18/2022) 30 tablet 1  ? saccharomyces boulardii (FLORASTOR) 250 MG capsule Take 1 capsule (250 mg total) by mouth 2 (two) times daily. 60 capsule 0  ? ?No current facility-administered medications for this visit.  ? ? ?SUMMARY OF ONCOLOGIC HISTORY: ?Oncology History Overview Note  ?Pap 12/14/20: adenocarcinoma, HPV negative ? ?HER-2 positive by FISH ?Her-2 equivocal by IHC ? ?MSI stable ? ?  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 Initial Diagnosis  ? Endometrial carcinoma ([place]) ?  ?01/12/2021 Initial Biopsy  ? A. ENDOCERVIX, CURETTAGE:  ?- Adenocarcinoma.  ?B. ENDOMETRIUM, BIOPSY:  ?- Adenocarcinoma.  ?COMMENT:   ?The differential diagnosis includes high grade serous carcinoma.  Both  ?specimens have a similar morphology; high grade serous carcinoma more  ?commonly arises in the endometrium.  Results reported to Dr. Hale Bogus  ?on 01/15/2021.  Dr. Saralyn Pilar reviewed the case.  ?  ?01/16/2021 Tumor Marker  ? Patient's tumor was tested for the following markers: CA-125 ?Results of the tumor marker test revealed normal value, 10.7 ?  ?01/25/2021 Imaging  ? CT C/A/P: ?1. Small volume fluid in the endometrial canal with 7 mm ?hypoattenuating lesion in the myometrium of the anterior fundus. ?2. No definite evidence for metastatic disease in the chest, ?abdomen, or pelvis. Small lymph nodes are noted along both pelvic ?sidewalls. Attention on follow-up recommended. ?3. 5 mm ground-glass opacity peripheral left upper lobe. This is ?likely related to infection/inflammation and potentially scar. ?Attention on surveillance imaging recommended. ?4. 5.8 cm lesion posterior lower uterine segment likely a fibroid. ?Ultrasound exam from 01/28/2010 demonstrated a 5.2 cm lesion in the ?left aspect of the posterior lower uterine body. ?5. Aortic Atherosclerosis (ICD10-I70.0). ?  ?01/29/2021 Surgery  ? TRH/BSO, SLN biopsy left, selective pelvic LND right, omentectomy ? ?Findings: On EUA, 10-12cm enlarged mobile  uterus. On intra-abdomina entry, minimal adhesions between the liver and anterior abdomen on the right. Some changes c/w fatty liver on the left. Omentum, stomach, small and large bowel all grossly normal. Bilateral ovaries normal appearing. Uterus with 5-6cm posterior fibroid, otherwise normal appearing. No mapping to right pelvis. On left, mapping to the level of the superior vessel artery, enlarged lymph node noted (likely sentinel) within the upper aspect of the obturator space. In bilateral pelvic basins, multiple enlarged lymph nodes. On the right, enlarged lymph nodes extended superiorly along the common iliac vessels. ?At the end of  surgery, no obvious abdominal or pelvic evidence of disease. ?  ?01/29/2021 Pathology Results  ? A. SENTINEL LYMPH NODE, LEFT INTERNAL ILIAC, BIOPSY:  ?- Metastatic carcinoma in (1) of (1) lymph node.  ?

## 2022-03-28 ENCOUNTER — Encounter: Payer: Self-pay | Admitting: Gynecologic Oncology

## 2022-03-28 ENCOUNTER — Encounter: Payer: Self-pay | Admitting: Hematology and Oncology

## 2022-03-29 ENCOUNTER — Inpatient Hospital Stay (HOSPITAL_BASED_OUTPATIENT_CLINIC_OR_DEPARTMENT_OTHER): Payer: Medicaid Other | Admitting: Gynecologic Oncology

## 2022-03-29 ENCOUNTER — Encounter: Payer: Self-pay | Admitting: Gynecologic Oncology

## 2022-03-29 ENCOUNTER — Other Ambulatory Visit: Payer: Self-pay

## 2022-03-29 VITALS — BP 128/74 | HR 75 | Temp 97.9°F | Resp 16 | Ht 62.0 in | Wt 220.0 lb

## 2022-03-29 DIAGNOSIS — C541 Malignant neoplasm of endometrium: Secondary | ICD-10-CM | POA: Diagnosis present

## 2022-03-29 DIAGNOSIS — N939 Abnormal uterine and vaginal bleeding, unspecified: Secondary | ICD-10-CM

## 2022-03-29 NOTE — Patient Instructions (Signed)
It was good to see you today.  I will call you with your biopsy results, which will be back sometime next week.  I have spoken with Dr. Alvy Bimler about what I saw and felt on exam today.  We will wait to see what your CT scan shows to make a decision about moving forward with treatment. ?

## 2022-03-29 NOTE — Progress Notes (Signed)
Gynecologic Oncology Return Clinic Visit ? ?03/29/2022 ? ?Reason for Visit: Vaginal bleeding ? ?Treatment History: ?Oncology History Overview Note  ?Pap 12/14/20: adenocarcinoma, HPV negative ? ?HER-2 positive by FISH ?Her-2 equivocal by IHC ? ?MSI stable ? ?  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 Initial Diagnosis  ? Endometrial carcinoma ([place]) ?  ?01/12/2021 Initial Biopsy  ? A. ENDOCERVIX, CURETTAGE:  ?- Adenocarcinoma.  ?B. ENDOMETRIUM, BIOPSY:  ?- Adenocarcinoma.  ?COMMENT:  ?The differential diagnosis includes high grade serous carcinoma.  Both  ?specimens have a similar morphology; high grade serous carcinoma more  ?commonly arises in the endometrium.  Results reported to Dr. Hale Bogus  ?on 01/15/2021.  Dr. Saralyn Pilar reviewed the case.  ?  ?01/16/2021 Tumor Marker  ? Patient's tumor was tested for the following markers: CA-125 ?Results of the tumor marker test revealed normal value, 10.7 ?  ?01/25/2021 Imaging  ? CT C/A/P: ?1. Small volume fluid in the endometrial canal with 7 mm ?hypoattenuating lesion in the myometrium of the anterior fundus. ?2. No definite evidence for metastatic disease in the chest, ?abdomen, or pelvis. Small lymph nodes are noted along both pelvic ?sidewalls. Attention on follow-up recommended. ?3. 5 mm ground-glass opacity peripheral left upper lobe. This is ?likely related to infection/inflammation and potentially scar. ?Attention on surveillance imaging recommended. ?4. 5.8 cm lesion posterior lower uterine segment likely a fibroid. ?Ultrasound exam from 01/28/2010 demonstrated a 5.2 cm lesion in the ?left aspect of the posterior lower uterine body. ?5. Aortic Atherosclerosis (ICD10-I70.0). ?  ?01/29/2021 Surgery  ? TRH/BSO, SLN biopsy left, selective pelvic LND right, omentectomy ? ?Findings: On EUA, 10-12cm enlarged mobile uterus. On intra-abdomina entry, minimal adhesions between the liver and anterior abdomen on the right. Some changes c/w fatty liver on the left.  Omentum, stomach, small and large bowel all grossly normal. Bilateral ovaries normal appearing. Uterus with 5-6cm posterior fibroid, otherwise normal appearing. No mapping to right pelvis. On left, mapping to the level of the superior vessel artery, enlarged lymph node noted (likely sentinel) within the upper aspect of the obturator space. In bilateral pelvic basins, multiple enlarged lymph nodes. On the right, enlarged lymph nodes extended superiorly along the common iliac vessels. ?At the end of surgery, no obvious abdominal or pelvic evidence of disease. ?  ?01/29/2021 Pathology Results  ? A. SENTINEL LYMPH NODE, LEFT INTERNAL ILIAC, BIOPSY:  ?- Metastatic carcinoma in (1) of (1) lymph node.  ? ?B. SENTINEL LYMPH NODE, LEFT OBTURATOR, BIOPSY:  ?- Metastatic carcinoma in (1) of (1) lymph node.  ? ?C. LYMPH NODES, LEFT PELVIC, DISSECTION:  ?- Metastatic carcinoma in (1) of (3) lymph nodes.  ? ?D. LYMPH NODE, RIGHT EXTERNAL AND COMMON ILIAC, BIOPSY:  ?- Metastatic carcinoma in (1) of (1) lymph node.  ? ?E. UTERUS, CERVIX AND BILATERAL FALLOPIAN TUBES AND OVARIES, TOTAL  ?HYSTERECTOMY AND BILATERAL SALPINGO-OOPHORECTOMY:  ?- High grade serous carcinoma of endometrium, with invasion more than  ?half of the myometrium.  ?- Tumor invades the stromal connective tissue of the cervix.  ?- No involvement of uterine serosa or adnexa.  ?- Lymphovascular invasion is identified.  ?- See oncology table.  ? ?F. OMENTUM, OMENTECTOMY:  ?- Omentum, negative for carcinoma.  ? ?G. LYMPH NODES, RIGHT PELVIC, DISSECTION:  ?- Two lymph nodes, negative for carcinoma (0/2).  ? ?ONCOLOGY TABLE:  ? ?UTERUS, CARCINOMA OR CARCINOSARCOMA: Resection  ? ?Procedure: Total hysterectomy and bilateral salpingo-oophorectomy,  ?Omentectomy, Lymph node sampling  ?Histologic Type: Serous carcinoma  ?Histologic Grade: High  grade  ?Myometrial Invasion: > 50%  ?Uterine Serosa Involvement: Not identified  ?Cervical Stroma Involvement: Present  ?Other  Tissue/Organ Involvement: Not identified  ?Peritoneal/Ascitic Fluid: Negative for carcinoma  ?Lymphovascular Invasion: Present  ?Regional Lymph Nodes:  ?     Pelvic Lymph Nodes Examined:  ?          2 Sentinel  ?          6 Non-Sentinel  ?          8 Total  ?     Pelvic Lymph Nodes with Metastasis: 4  ?          Macrometastasis: 3  ?          Micrometastasis: 1  ?          Isolated Tumor Cells: 0  ?          Laterality of Lymph Nodes with Tumor: Right (non-sentinel),  ?Left (sentinel and non-sentinel)  ?          Extracapsular Extension: Present  ?     Para-Aortic Lymph Nodes Examined: Not applicable  ? ?     Para-Aortic Lymph Nodes with Metastasis: Not applicable  ?Distant Metastasis:  ?     Distant Site(s) Involved: Omentum: Not involved  ?Pathologic Stage Classification (pTNM, AJCC 8th Edition): pT2, pN1a  ?Ancillary Studies: HER2 will be ordered  ?Additional Findings: Leiomyomata.  Adenomyosis.  ?Representative Tumor Block: B1  ?Comment: Dr. Saralyn Pilar reviewed select slides.  ?(v4.2.0.1)  ?  ?01/29/2021 Cancer Staging  ? Staging form: Corpus Uteri - Carcinoma and Carcinosarcoma, AJCC 8th Edition ?- Clinical stage from 01/29/2021: FIGO Stage IIIC1 (cT1b, cN1a, cM0) - Signed by Lafonda Mosses, MD on 02/02/2021 ?Histopathologic type: Mixed cell adenocarcinoma ?Stage prefix: Initial diagnosis ?Method of lymph node assessment: Other ?Histologic grade (G): G3 ?Histologic grading system: 3 grade system ?Lymph-vascular invasion (LVI): LVI present/identified, NOS ?Peritoneal cytology results: Negative ?Pelvic nodal status: Positive ?Number of pelvic nodes positive from dissection: 4 ?Number of pelvic nodes examined during dissection: 8 ?Para-aortic status: Not assessed ?Lymph node metastasis: Present ?Omentectomy performed: Yes ?Morcellation performed: No ? ?  ?02/19/2021 Echocardiogram  ?  1. Left ventricular ejection fraction, by estimation, is 55 to 60%. The left ventricle has normal function. The left ventricle  demonstrates regional wall motion abnormalities (see scoring diagram/findings for description). There is mild left ventricular  hypertrophy. Left ventricular diastolic parameters are consistent with Grade I diastolic dysfunction (impaired relaxation). There is moderate hypokinesis of the left ventricular, basal septal wall and inferior wall. The average left ventricular global longitudinal strain is -17.9 %. The global longitudinal strain is abnormal. ? 2. Right ventricular systolic function is normal. The right ventricular size is normal. There is normal pulmonary artery systolic pressure. The estimated right ventricular systolic pressure is 61.6 mmHg. ? 3. The mitral valve is abnormal. Trivial mitral valve regurgitation. ? 4. The aortic valve is tricuspid. Aortic valve regurgitation is not visualized. ? 5. The inferior vena cava is normal in size with greater than 50% respiratory variability, suggesting right atrial pressure of 3 mmHg. ?  ?02/20/2021 Procedure  ? Successful placement of a right IJ approach Power Port with ultrasound and fluoroscopic guidance. The catheter is ready for use. ?  ?02/28/2021 - 06/14/2021 Chemotherapy  ?  Patient is on Treatment Plan: UTERINE CARBOPLATIN AUC 6 / PACLITAXEL Q21D ? ?  ? ?  ?04/20/2021 Imaging  ? Status post hysterectomy and suspected bilateral salpingo-oophorectomy. ?  ?2.2 cm fluid density  lesion along the left pelvic sidewall likely reflects a postoperative seroma. ?  ?No findings suspicious for recurrent or metastatic disease. ?  ?07/16/2021 - 08/16/2021 Radiation Therapy  ? Radiation Treatment Dates: 07/16/2021 through 08/16/2021 ?Site Technique Total Dose (Gy) Dose per Fx (Gy) Completed Fx Beam Energies  ?Vagina: Pelvis HDR-brachy 30/30 6 5/5 Ir-192  ?  ?  ?  ?10/01/2021 Imaging  ? Increasing size of LEFT pelvic lymph nodes, largest approximately 11 mm along the external iliac chain. Given findings and history could consider PET for further evaluation as warranted. ?  ?Cystic  area along the LEFT pelvic sidewall that was seen previously has improved. ?  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 ?  ?Ao

## 2022-04-04 ENCOUNTER — Encounter: Payer: Self-pay | Admitting: Hematology and Oncology

## 2022-04-04 ENCOUNTER — Telehealth: Payer: Self-pay

## 2022-04-04 ENCOUNTER — Other Ambulatory Visit: Payer: Self-pay | Admitting: Hematology and Oncology

## 2022-04-04 DIAGNOSIS — C541 Malignant neoplasm of endometrium: Secondary | ICD-10-CM

## 2022-04-04 LAB — SURGICAL PATHOLOGY

## 2022-04-04 NOTE — Progress Notes (Signed)
DISCONTINUE OFF PATHWAY REGIMEN - Uterine ? ? ?[id number]:Carboplatin IV + Paclitaxel IV + Trastuzumab IV q21 Days (C1-6) Followed by Trastuzumab IV q21 Days (C7+): ?  Cycle 1: A cycle is 21 days: ?    Trastuzumab-xxxx  ?    Paclitaxel  ?    Carboplatin  ?  Cycles 2 through 6: A cycle is every 21 days: ?    Trastuzumab-xxxx  ?    Paclitaxel  ?    Carboplatin  ?  Cycles 7 and beyond: A cycle is every 21 days: ?    Trastuzumab-xxxx  ? ?**Always confirm dose/schedule in your pharmacy ordering system** ? ?REASON: Toxicities / Adverse Event ?PRIOR TREATMENT: Off Pathway: Carboplatin IV + Paclitaxel IV + Trastuzumab IV q21 Days (C1-6) Followed by Trastuzumab IV q21 Days (C7+) ?TREATMENT RESPONSE: Stable Disease (SD) ? ?START ON PATHWAY REGIMEN - Uterine ? ? ?  A cycle is every 21 days: ?    Lenvatinib  ?    Pembrolizumab  ? ?**Always confirm dose/schedule in your pharmacy ordering system** ? ?Patient Characteristics: ?Serous Carcinoma, Recurrent/Progressive Disease, Third [place] and Beyond, HER2 Positive ?Histology: Serous Carcinoma ?Therapeutic Status: Recurrent or Progressive Disease ?Line of Therapy: Third Line and Beyond ?HER2 Status: Positive ?Intent of Therapy: ?Non-Curative / Palliative Intent, Discussed with Patient ?

## 2022-04-04 NOTE — Telephone Encounter (Signed)
[name], given radiology scheduling # and ask her to call and move CT up earlier. Call the office  once it is scheduled. Will move port/lab flush appt and Dr. Alvy Bimler appt. ?

## 2022-04-05 ENCOUNTER — Other Ambulatory Visit: Payer: Self-pay | Admitting: Pharmacist

## 2022-04-05 ENCOUNTER — Telehealth: Payer: Self-pay | Admitting: Gynecologic Oncology

## 2022-04-05 ENCOUNTER — Other Ambulatory Visit: Payer: Self-pay | Admitting: Hematology and Oncology

## 2022-04-05 ENCOUNTER — Telehealth: Payer: Self-pay

## 2022-04-05 ENCOUNTER — Other Ambulatory Visit (HOSPITAL_COMMUNITY): Payer: Self-pay

## 2022-04-05 ENCOUNTER — Encounter: Payer: Self-pay | Admitting: Hematology and Oncology

## 2022-04-05 DIAGNOSIS — C541 Malignant neoplasm of endometrium: Secondary | ICD-10-CM

## 2022-04-05 MED ORDER — LENVATINIB (20 MG DAILY DOSE) 2 X 10 MG PO CPPK
10.0000 mg | ORAL_CAPSULE | Freq: Every day | ORAL | 11 refills | Status: DC
  Filled 2022-04-05: qty 60, 60d supply, fill #0

## 2022-04-05 MED ORDER — LENVIMA (10 MG DAILY DOSE) 10 MG PO CPPK
10.0000 mg | ORAL_CAPSULE | Freq: Every day | ORAL | 11 refills | Status: DC
  Filled 2022-04-05 – 2022-04-11 (×2): qty 30, 30d supply, fill #0
  Filled 2022-05-15: qty 30, 30d supply, fill #1
  Filled 2022-06-13: qty 30, 30d supply, fill #2
  Filled 2022-07-19: qty 30, 30d supply, fill #3

## 2022-04-05 NOTE — Telephone Encounter (Signed)
Called back and spoke with daughter. Per Dr. Alvy Bimler per previous discussion, she will prescribe Lenvima and Pembro. Dr. Alvy Bimler is creating a treatment plan and sending a scheduling message for infusion. Reviewed appts at [facility] and given appt with Dr. Alvy Bimler on 5/2 at 1 pm for 45 mins. The office is working on the [place] for [place], do not start [place] until 5/2 office visit and she is instructed to start. ?[name] verbalized understanding. ?

## 2022-04-05 NOTE — Telephone Encounter (Signed)
Discussed biopsy results with patient and her daughter (I had previously released the biopsy result to them).  The patient is scheduled for CT scan on the 27th and sees Dr. Alvy Bimler shortly after.  They are aware of plans for next treatment, and the patient at this time is wanting to proceed with cancer directed treatment.  All questions answered. ? ?Jeral Pinch MD ?Gynecologic Oncology ? ?

## 2022-04-05 NOTE — Telephone Encounter (Signed)
Oral Oncology Patient Advocate Encounter ? ?After completing a benefits investigation, prior authorization for Lenvima is not required at this time through [place]. ? ?Patient's copay is $4.   ? ?Wynn Maudlin CPHT ?Specialty Pharmacy Patient Advocate ?[place] ?Phone [phone] ?Fax [phone] ?04/05/2022 10:06 AM ? ? ?  ? ?

## 2022-04-05 NOTE — Telephone Encounter (Signed)
Called back and spoke with [facility] instructed per Dr. Alvy Bimler do not start [place] until seen by Dr. Alvy Bimler 5/2. ?

## 2022-04-08 ENCOUNTER — Telehealth: Payer: Self-pay

## 2022-04-08 NOTE — Telephone Encounter (Addendum)
Oral Oncology Pharmacist Encounter ? ?Received new prescription for lenvatinib [place]) for the treatment of endometrial cancer in conjunction with pembrolizumab, planned duration until disease progression or unacceptable toxicity. ? ?Labs from 03/26/22 (CBC, CMP) assessed, no interventions needed. TSH, total protein are standing orders. Prescription dose and frequency assessed. ? ?Current medication list in Epic reviewed, DDIs with lenvima identified: none ? ?Evaluated chart and no patient barriers to medication adherence noted.  ? ?Patient agreement for treatment documented in MD note on 03/26/2022. ? ?Prescription has been e-scribed to the [facility] for benefits analysis and approval. ? ?Oral Oncology Clinic will continue to follow for insurance authorization, copayment issues, initial counseling and start date. ? ?Drema Halon, PharmD ?Hematology/Oncology Clinical Pharmacist ?[facility] ?[phone] ?04/08/2022 9:11 AM ? ? ?

## 2022-04-11 ENCOUNTER — Ambulatory Visit (HOSPITAL_COMMUNITY)
Admission: RE | Admit: 2022-04-11 | Discharge: 2022-04-11 | Disposition: A | Discharge status: Home / Self Care | Payer: Medicaid Other | Source: Ambulatory Visit | Attending: Hematology and Oncology | Admitting: Hematology and Oncology

## 2022-04-11 ENCOUNTER — Other Ambulatory Visit (HOSPITAL_COMMUNITY): Payer: Self-pay

## 2022-04-11 ENCOUNTER — Inpatient Hospital Stay: Payer: Medicaid Other

## 2022-04-11 DIAGNOSIS — C541 Malignant neoplasm of endometrium: Secondary | ICD-10-CM

## 2022-04-11 DIAGNOSIS — D61818 Other pancytopenia: Secondary | ICD-10-CM

## 2022-04-11 LAB — CBC WITH DIFFERENTIAL (CANCER CENTER ONLY)
Abs Immature Granulocytes: 0.05 10*3/uL (ref 0.00–0.07)
Basophils Absolute: 0 10*3/uL (ref 0.0–0.1)
Basophils Relative: 0 %
Eosinophils Absolute: 0.1 10*3/uL (ref 0.0–0.5)
Eosinophils Relative: 1 %
HCT: 30.1 % — ABNORMAL LOW (ref 36.0–46.0)
Hemoglobin: 9.8 g/dL — ABNORMAL LOW (ref 12.0–15.0)
Immature Granulocytes: 1 %
Lymphocytes Relative: 27 %
Lymphs Abs: 1.9 10*3/uL (ref 0.7–4.0)
MCH: 29.8 pg (ref 26.0–34.0)
MCHC: 32.6 g/dL (ref 30.0–36.0)
MCV: 91.5 fL (ref 80.0–100.0)
Monocytes Absolute: 0.6 10*3/uL (ref 0.1–1.0)
Monocytes Relative: 8 %
Neutro Abs: 4.4 10*3/uL (ref 1.7–7.7)
Neutrophils Relative %: 63 %
Platelet Count: 275 10*3/uL (ref 150–400)
RBC: 3.29 MIL/uL — ABNORMAL LOW (ref 3.87–5.11)
RDW: 17.4 % — ABNORMAL HIGH (ref 11.5–15.5)
WBC Count: 6.9 10*3/uL (ref 4.0–10.5)
nRBC: 0 % (ref 0.0–0.2)

## 2022-04-11 LAB — CMP (CANCER CENTER ONLY)
ALT: 21 U/L (ref 0–44)
AST: 18 U/L (ref 15–41)
Albumin: 3.8 g/dL (ref 3.5–5.0)
Alkaline Phosphatase: 100 U/L (ref 38–126)
Anion gap: 8 (ref 5–15)
BUN: 17 mg/dL (ref 8–23)
CO2: 24 mmol/L (ref 22–32)
Calcium: 9.5 mg/dL (ref 8.9–10.3)
Chloride: 105 mmol/L (ref 98–111)
Creatinine: 1 mg/dL (ref 0.44–1.00)
GFR, Estimated: >60 mL/min (ref >60)
Glucose, Bld: 135 mg/dL — ABNORMAL HIGH (ref 70–99)
Potassium: 4 mmol/L (ref 3.5–5.1)
Sodium: 137 mmol/L (ref 135–145)
Total Bilirubin: 0.4 mg/dL (ref 0.3–1.2)
Total Protein: 8 g/dL (ref 6.5–8.1)

## 2022-04-11 LAB — SAMPLE TO BLOOD BANK

## 2022-04-11 LAB — TOTAL PROTEIN, URINE DIPSTICK: Protein, ur: 30 mg/dL — AB

## 2022-04-11 LAB — TSH: TSH: 0.921 u[IU]/mL (ref 0.350–4.500)

## 2022-04-11 MED ORDER — HEPARIN SOD (PORK) LOCK FLUSH 100 UNIT/ML IV SOLN: 500.0000 [IU] | Freq: Once | INTRAVENOUS | Status: DC

## 2022-04-11 MED ORDER — SODIUM CHLORIDE 0.9% FLUSH
10.0000 mL | Freq: Once | INTRAVENOUS | Status: AC
  Administered 2022-04-11: 10 mL

## 2022-04-11 MED ORDER — HEPARIN SOD (PORK) LOCK FLUSH 100 UNIT/ML IV SOLN
INTRAVENOUS | Status: AC
  Administered 2022-04-11: 500 [IU]
  Filled 2022-04-11: qty 5

## 2022-04-11 MED ORDER — IOHEXOL 300 MG/ML  SOLN
100.0000 mL | Freq: Once | INTRAMUSCULAR | Status: AC | PRN
  Administered 2022-04-11: 100 mL via INTRAVENOUS

## 2022-04-12 ENCOUNTER — Encounter: Payer: Self-pay | Admitting: Cardiovascular Disease

## 2022-04-12 ENCOUNTER — Ambulatory Visit (INDEPENDENT_AMBULATORY_CARE_PROVIDER_SITE_OTHER): Payer: Medicaid Other | Admitting: Cardiovascular Disease

## 2022-04-12 ENCOUNTER — Other Ambulatory Visit (HOSPITAL_COMMUNITY): Payer: Self-pay

## 2022-04-12 VITALS — BP 110/56 | HR 76 | Ht 62.0 in | Wt 216.2 lb

## 2022-04-12 DIAGNOSIS — E1169 Type 2 diabetes mellitus with other specified complication: Secondary | ICD-10-CM

## 2022-04-12 DIAGNOSIS — I10 Essential (primary) hypertension: Secondary | ICD-10-CM

## 2022-04-12 DIAGNOSIS — I5032 Chronic diastolic (congestive) heart failure: Secondary | ICD-10-CM

## 2022-04-12 DIAGNOSIS — I25118 Atherosclerotic heart disease of native coronary artery with other forms of angina pectoris: Secondary | ICD-10-CM | POA: Diagnosis not present

## 2022-04-12 DIAGNOSIS — I110 Hypertensive heart disease with heart failure: Secondary | ICD-10-CM

## 2022-04-12 DIAGNOSIS — I43 Cardiomyopathy in diseases classified elsewhere: Secondary | ICD-10-CM

## 2022-04-12 DIAGNOSIS — C541 Malignant neoplasm of endometrium: Secondary | ICD-10-CM

## 2022-04-12 DIAGNOSIS — E785 Hyperlipidemia, unspecified: Secondary | ICD-10-CM

## 2022-04-12 DIAGNOSIS — E669 Obesity, unspecified: Secondary | ICD-10-CM

## 2022-04-12 DIAGNOSIS — Z5111 Encounter for antineoplastic chemotherapy: Secondary | ICD-10-CM

## 2022-04-12 LAB — T4: T4, Total: 8.8 ug/dL (ref 4.5–12.0)

## 2022-04-12 MED ORDER — NITROGLYCERIN 0.4 MG SL SUBL
0.4000 mg | SUBLINGUAL_TABLET | SUBLINGUAL | 2 refills | Status: DC | PRN
  Filled 2022-04-12: qty 25, 10d supply, fill #0

## 2022-04-12 NOTE — Patient Instructions (Signed)
Medication Instructions:  ?No changes ?*If you need a refill on your cardiac medications before your next appointment, please call your pharmacy* ? ? ?Lab Work: ?None ordered ?If you have labs (blood work) drawn today and your tests are completely normal, you will receive your results only by: ?MyChart Message (if you have MyChart) OR ?A paper copy in the mail ?If you have any lab test that is abnormal or we need to change your treatment, we will call you to review the results. ? ? ?Testing/Procedures: ?Your physician has requested that you have an echocardiogram in June. Echocardiography is a painless test that uses sound waves to create images of your heart. It provides your doctor with information about the size and shape of your heart and how well your heart?s chambers and valves are working. You may receive an ultrasound enhancing agent through an IV if needed to better visualize your heart during the echo.This procedure takes approximately one hour. There are no restrictions for this procedure. This will take place at the [address], [address].  ? ? ? ?Follow-Up: ?At [facility], you and your health needs are our priority.  As part of our continuing mission to provide you with exceptional heart care, we have created designated Provider Care Teams.  These Care Teams include your primary Cardiologist (physician) and Advanced Practice Providers (APPs -  Physician Assistants and Nurse Practitioners) who all work together to provide you with the care you need, when you need it. ? ?We recommend signing up for the patient portal called "MyChart".  Sign up information is provided on this After Visit Summary.  MyChart is used to connect with patients for Virtual Visits (Telemedicine).  Patients are able to view lab/test results, encounter notes, upcoming appointments, etc.  Non-urgent messages can be sent to your provider as well.   ?To learn more about what you can do with MyChart, go to  [email or web address].   ? ?Your next appointment:   ?Follow up with Dr. Sallyanne Kuster in June after the ECHO ? ? ?

## 2022-04-12 NOTE — Progress Notes (Signed)
Patient ID: [name], female   DOB: [birth year], 66 y.o.   MRN: [id number] ?  ? ?Cardiology Office Note   ? ?Date:  04/14/2022  ? ?ID:  [name], DOB July 21, 1956, MRN [id number] ? ?PCP:  [name], MD  ?Cardiologist:   Sanda Klein, MD  ? ?No chief complaint on file. ? ? ?History of Present Illness:  ?[name] is a 66 y.o. female with chronic diastolic heart failure due to a combination of hypertensive and ischemic heart disease, s/p total abdominal hysterectomy with bilateral salpingo-oophorectomy with suspicion for residual endometrial cancer based on imaging studies. ? ?She has not done well with previous chemotherapy.  She is seeing Dr. Alvy Bimler on Tuesday to start treatment with Keytruda + Lenmiva.  She was hospitalized in February with diverticulitis.  An echocardiogram was performed during that admission, but she did not have clinical heart failure exacerbation or coronary symptoms.. ? ?From a cardiac point of view she has been very well compensated.  She has not had chest pain in over a year.  She is only taking a tiny dose of loop diuretic (furosemide 20 mg every other day and has not required additional dose titration or hospitalization for heart failure.  Denies orthopnea, PND, leg edema.  Has not had palpitations, dizziness or syncope.  Has no history of atrial fibrillation, stroke/TIA or other embolic events. ? ?Metabolic control is also pretty good.  Most recent hemoglobin A1c was only borderline high at 7.3%.  Most recent LDL cholesterol was likewise borderline at 79, but now has a markedly improved HDL of 56.  She is mildly anemic with a hemoglobin of 9.6 and has normal renal function and electrolytes.  She has lost a little weight, but remains severely obese with a BMI of almost 40. ? ?Her most recent echocardiogram in February 2023 showed normal left ventricular systolic function with an EF 60 to 65% and impaired relaxation changes.  Her last revascularization procedure was  placement of a drug-eluting stent in the right coronary artery in September 2016 and she had a repeat coronary angiogram in August 2019, with stable findings.   ? ?She underwent cardiac catheterization after an abnormal stress test that described extensive anterior and lateral ischemia in August 2019.  Cardiac catheterization showed occlusion of the distal left circumflex coronary artery, multiple moderate stenoses in  relatively small vessels (70% distal LAD, 80% OM1, 50% OM 2) and a widely patent stent in the right coronary artery with a maximum stenosis of 20%.  Medical therapy was recommended. ? ? ? ?Past Medical History:  ?Diagnosis Date  ? Anginal pain ([place]) 2016  ? Cancer [facility])   ? Coronary atherosclerosis of native coronary artery, with stent to LCX in 2006 06/04/2013  ? Diabetes mellitus without complication ([place])   ? GERD (gastroesophageal reflux disease)   ? Heart attack [facility]) 2006  ? History of radiation therapy 08/16/2021  ? 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  Dr Gery Pray  ? Hyperlipidemia LDL goal < 70 06/07/2013  ? Hypertension   ? Obesity   ? S/P coronary artery stent placement to RCA with PROMUS DES, 06/07/13, residual disease on OM1, OM2 and rPDA 06/07/2013  ? ? ?Past Surgical History:  ?Procedure Laterality Date  ? CARDIAC CATHETERIZATION N/A 08/22/2015  ? Procedure: Left Heart Cath and Coronary Angiography;  Surgeon: Leonie Man, MD;  Location: [facility];  Service: Cardiovascular;  Laterality: N/A;  ? CARDIAC CATHETERIZATION N/A 08/22/2015  ? Procedure: Coronary Stent Intervention;  Surgeon: Leonie Man, MD;  Location: [place] [facility];  Service: Cardiovascular;  Laterality: N/A;  ? CARDIAC CATHETERIZATION N/A 08/22/2015  ? Procedure: Left Heart Cath and Coronary Angiography;  Surgeon: Leonie Man, MD;  Location: [place] [facility];  Service: Cardiovascular;  Laterality: N/A;  ? CORONARY STENT PLACEMENT  2006  ? TAXUS stent CFX in [facility]  ? IR IMAGING GUIDED PORT INSERTION   02/20/2021  ? LEFT HEART CATH AND CORONARY ANGIOGRAPHY N/A 07/21/2018  ? Procedure: LEFT HEART CATH AND CORONARY ANGIOGRAPHY;  Surgeon: Troy Sine, MD;  Location: [place] [facility];  Service: Cardiovascular;  Laterality: N/A;  ? LEFT HEART CATHETERIZATION WITH CORONARY ANGIOGRAM N/A 06/07/2013  ? Procedure: LEFT HEART CATHETERIZATION WITH CORONARY ANGIOGRAM;  Surgeon: Sanda Klein, MD;  Location: [facility] C[facility];  Service: Cardiovascular;  Laterality: N/A;  ? LYMPH NODE DISSECTION N/A 01/29/2021  ? Procedure: SELECTIVE LYMPH NODE DISSECTION;  Surgeon: Lafonda Mosses, MD;  Location: [facility];  Service: Gynecology;  Laterality: N/A;  ? ROBOTIC ASSISTED TOTAL HYSTERECTOMY WITH BILATERAL SALPINGO OOPHERECTOMY Bilateral 01/29/2021  ? Procedure: XI ROBOTIC ASSISTED TOTAL HYSTERECTOMY WITH BILATERAL SALPINGO OOPHORECTOMY,;  Surgeon: Lafonda Mosses, MD;  Location: [facility];  Service: Gynecology;  Laterality: Bilateral;  ? SENTINEL NODE BIOPSY N/A 01/29/2021  ? Procedure: SENTINEL NODE BIOPSY;  Surgeon: Lafonda Mosses, MD;  Location: [facility];  Service: Gynecology;  Laterality: N/A;  ? ? ?Outpatient Medications Prior to Visit  ?Medication Sig Dispense Refill  ? amLODipine (NORVASC) 5 MG tablet Take 1 tablet by mouth daily. 30 tablet 6  ? aspirin 81 MG EC tablet Take 1 tablet (81 mg total) by mouth daily. 30 tablet 12  ? atorvastatin (LIPITOR) 80 MG tablet TAKE 1 TABLET (80 MG TOTAL) BY MOUTH AT BEDTIME. 30 tablet 6  ? cholestyramine light (PREVALITE) 4 g packet Dissolve in water as directed then take 1 packet by mouth daily at 12 noon. 30 each 0  ? Dulaglutide (TRULICITY) 1.5 QI/2.9NL SOPN Inject 1.5 mg into the skin once a week. 2 mL 6  ? furosemide (LASIX) 20 MG tablet Take 1 tablet (20 mg total) by mouth every other day. 30 tablet 3  ? glimepiride (AMARYL) 4 MG tablet Take 1 tablet (4 mg total) by mouth daily with breakfast. 30 tablet 6  ? isosorbide mononitrate (IMDUR) 30 MG 24 hr tablet Take 2 tablets (60 mg  total) by mouth daily. 60 tablet 6  ? lisinopril (ZESTRIL) 20 MG tablet TAKE 1 TABLET BY MOUTH ONCE A DAY 30 tablet 6  ? metFORMIN (GLUCOPHAGE) 500 MG tablet Take 2 tablets (1,000 mg total) by mouth 2 (two) times daily with a meal. 120 tablet 6  ? metoprolol tartrate (LOPRESSOR) 100 MG tablet TAKE 1 TABLET (100 MG TOTAL) BY MOUTH 2 (TWO) TIMES DAILY. 60 tablet 6  ? lenvatinib 10 mg daily dose (LENVIMA, 10 MG DAILY DOSE,) capsule Take 1 capsule (10 mg total) by mouth daily. (Patient not taking: Reported on 04/12/2022) 30 capsule 11  ? nitroGLYCERIN (NITROSTAT) 0.4 MG SL tablet Place 1 tablet (0.4 mg total) under the tongue every 5 (five) minutes as needed for chest pain. (Patient not taking: Reported on 04/12/2022) 25 tablet 2  ? ?No facility-administered medications prior to visit.  ?  ? ?Allergies:   Patient has no known allergies.  ? ?Social History  ? ?Socioeconomic History  ? Marital status: Divorced  ?  Spouse name: Not on file  ? Number  of children: 1  ? Years of education: Not on file  ? Highest education level: Not on file  ?Occupational History  ? Occupation: Scientist, water quality  ?Tobacco Use  ? Smoking status: Former  ?  Types: Cigars  ?  Quit date: 06/23/2004  ?  Years since quitting: 17.8  ? Smokeless tobacco: Never  ? Tobacco comments:  ?  "a few black and milds a day" CigrettesX 30 years  ?Vaping Use  ? Vaping Use: Never used  ?Substance and Sexual Activity  ? Alcohol use: Yes  ?  Comment: occ  ? Drug use: No  ? Sexual activity: Not Currently  ?Other Topics Concern  ? Not on file  ?Social History Narrative  ? Not on file  ? ?Social Determinants of Health  ? ?Financial Resource Strain: Not on file  ?Food Insecurity: Not on file  ?Transportation Needs: Not on file  ?Physical Activity: Not on file  ?Stress: Not on file  ?Social Connections: Not on file  ?  ?ROS:   ?Please see the history of present illness.   All other systems are reviewed and are negative.  ? ?PHYSICAL EXAM:   ?VS:  BP (!) 110/56 (BP Location: Left Arm,  Patient Position: Sitting, Cuff Size: Large)   Pulse 76   Ht '5\' 2"'$  (1.575 m)   Wt 216 lb 3.2 oz (98.1 kg)   SpO2 95%   BMI 39.54 kg/m?    ? ? ?General: Alert, oriented x3, no distress, severely obese ?Head: no

## 2022-04-15 ENCOUNTER — Other Ambulatory Visit (HOSPITAL_COMMUNITY): Payer: Self-pay

## 2022-04-15 NOTE — Telephone Encounter (Signed)
Oral Oncology Pharmacist Encounter ? ?Patient will pick up Lenvima medication on 04/16/22 prior to appointment with Dr. Alvy Bimler. Patient will be counseled in person at the meeting. ? ?Drema Halon, PharmD ?Hematology/Oncology Clinical Pharmacist ?[facility] ?[phone] ?

## 2022-04-16 ENCOUNTER — Encounter: Payer: Self-pay | Admitting: Hematology and Oncology

## 2022-04-16 ENCOUNTER — Other Ambulatory Visit: Payer: Self-pay

## 2022-04-16 ENCOUNTER — Inpatient Hospital Stay: Payer: Medicaid Other | Attending: Gynecologic Oncology | Admitting: Hematology and Oncology

## 2022-04-16 ENCOUNTER — Inpatient Hospital Stay: Payer: Medicaid Other

## 2022-04-16 ENCOUNTER — Other Ambulatory Visit (HOSPITAL_COMMUNITY): Payer: Self-pay

## 2022-04-16 DIAGNOSIS — M47814 Spondylosis without myelopathy or radiculopathy, thoracic region: Secondary | ICD-10-CM | POA: Diagnosis not present

## 2022-04-16 DIAGNOSIS — Z7982 Long term (current) use of aspirin: Secondary | ICD-10-CM | POA: Diagnosis not present

## 2022-04-16 DIAGNOSIS — D6481 Anemia due to antineoplastic chemotherapy: Secondary | ICD-10-CM | POA: Diagnosis not present

## 2022-04-16 DIAGNOSIS — E1169 Type 2 diabetes mellitus with other specified complication: Secondary | ICD-10-CM | POA: Diagnosis not present

## 2022-04-16 DIAGNOSIS — Z923 Personal history of irradiation: Secondary | ICD-10-CM | POA: Diagnosis not present

## 2022-04-16 DIAGNOSIS — Z79899 Other long term (current) drug therapy: Secondary | ICD-10-CM | POA: Insufficient documentation

## 2022-04-16 DIAGNOSIS — R109 Unspecified abdominal pain: Secondary | ICD-10-CM | POA: Insufficient documentation

## 2022-04-16 DIAGNOSIS — I129 Hypertensive chronic kidney disease with stage 1 through stage 4 chronic kidney disease, or unspecified chronic kidney disease: Secondary | ICD-10-CM | POA: Diagnosis not present

## 2022-04-16 DIAGNOSIS — T451X5A Adverse effect of antineoplastic and immunosuppressive drugs, initial encounter: Secondary | ICD-10-CM | POA: Diagnosis not present

## 2022-04-16 DIAGNOSIS — E1122 Type 2 diabetes mellitus with diabetic chronic kidney disease: Secondary | ICD-10-CM | POA: Insufficient documentation

## 2022-04-16 DIAGNOSIS — Z7984 Long term (current) use of oral hypoglycemic drugs: Secondary | ICD-10-CM | POA: Insufficient documentation

## 2022-04-16 DIAGNOSIS — C541 Malignant neoplasm of endometrium: Secondary | ICD-10-CM | POA: Insufficient documentation

## 2022-04-16 DIAGNOSIS — E119 Type 2 diabetes mellitus without complications: Secondary | ICD-10-CM | POA: Insufficient documentation

## 2022-04-16 DIAGNOSIS — N1831 Chronic kidney disease, stage 3a: Secondary | ICD-10-CM | POA: Insufficient documentation

## 2022-04-16 DIAGNOSIS — I700 Atherosclerosis of aorta: Secondary | ICD-10-CM | POA: Insufficient documentation

## 2022-04-16 DIAGNOSIS — Z5112 Encounter for antineoplastic immunotherapy: Secondary | ICD-10-CM | POA: Insufficient documentation

## 2022-04-16 DIAGNOSIS — Z7985 Long-term (current) use of injectable non-insulin antidiabetic drugs: Secondary | ICD-10-CM | POA: Diagnosis not present

## 2022-04-16 DIAGNOSIS — E669 Obesity, unspecified: Secondary | ICD-10-CM | POA: Diagnosis not present

## 2022-04-16 DIAGNOSIS — K5730 Diverticulosis of large intestine without perforation or abscess without bleeding: Secondary | ICD-10-CM | POA: Diagnosis not present

## 2022-04-16 DIAGNOSIS — Z7189 Other specified counseling: Secondary | ICD-10-CM

## 2022-04-16 MED ORDER — SODIUM CHLORIDE 0.9% FLUSH
10.0000 mL | INTRAVENOUS | Status: DC | PRN
  Administered 2022-04-16: 10 mL

## 2022-04-16 MED ORDER — SODIUM CHLORIDE 0.9 % IV SOLN: Freq: Once | INTRAVENOUS | Status: AC

## 2022-04-16 MED ORDER — SODIUM CHLORIDE 0.9 % IV SOLN
200.0000 mg | Freq: Once | INTRAVENOUS | Status: AC
  Administered 2022-04-16: 200 mg via INTRAVENOUS
  Filled 2022-04-16: qty 200

## 2022-04-16 MED ORDER — HEPARIN SOD (PORK) LOCK FLUSH 100 UNIT/ML IV SOLN
500.0000 [IU] | Freq: Once | INTRAVENOUS | Status: AC | PRN
  Administered 2022-04-16: 500 [IU]

## 2022-04-16 NOTE — Assessment & Plan Note (Signed)
We had numerous discussions about goals of care ?She is interested for treatment ?We discussed the importance of lifestyle modification while on treatment ?

## 2022-04-16 NOTE — Assessment & Plan Note (Signed)
She has multifactorial anemia, likely due to mild vaginal bleeding and chronic kidney disease ?Observe closely ?

## 2022-04-16 NOTE — Patient Instructions (Signed)
[place] ?Discharge Instructions for Patients Receiving Chemotherapy ? ?Today you received the following chemotherapy agents keytruda  ? ?To help prevent nausea and vomiting after your treatment, we encourage you to take your nausea medication as directed ?  ?If you develop nausea and vomiting that is not controlled by your nausea medication, call the clinic.  ? ?BELOW ARE SYMPTOMS THAT SHOULD BE REPORTED IMMEDIATELY: ?*FEVER GREATER THAN 100.5 F ?*CHILLS WITH OR WITHOUT FEVER ?NAUSEA AND VOMITING THAT IS NOT CONTROLLED WITH YOUR NAUSEA MEDICATION ?*UNUSUAL SHORTNESS OF BREATH ?*UNUSUAL BRUISING OR BLEEDING ?TENDERNESS IN MOUTH AND THROAT WITH OR WITHOUT PRESENCE OF ULCERS ?*URINARY PROBLEMS ?*BOWEL PROBLEMS ?UNUSUAL RASH ?Items with * indicate a potential emergency and should be followed up as soon as possible. ? ?Feel free to call the clinic you have any questions or concerns. The clinic phone number is [phone]. ? ?Pembrolizumab injection ?What is this medication? ?PEMBROLIZUMAB (pem broe liz ue mab) is a monoclonal antibody. It is used to treat certain types of cancer. ?This medicine may be used for other purposes; ask your health care provider or pharmacist if you have questions. ?COMMON BRAND NAME(S): Keytruda ?What should I tell my care team before I take this medication? ?They need to know if you have any of these conditions: ?autoimmune diseases like Crohn's disease, ulcerative colitis, or lupus ?have had or planning to have an allogeneic stem cell transplant (uses someone else's stem cells) ?history of organ transplant ?history of chest radiation ?nervous system problems like myasthenia gravis or Guillain-Barre syndrome ?an unusual or allergic reaction to pembrolizumab, other medicines, foods, dyes, or preservatives ?pregnant or trying to get pregnant ?breast-feeding ?How should I use this medication? ?This medicine is for infusion into a vein. It is given by a health care  professional in a hospital or clinic setting. ?A special MedGuide will be given to you before each treatment. Be sure to read this information carefully each time. ?Talk to your pediatrician regarding the use of this medicine in children. While this drug may be prescribed for children as young as 6 months for selected conditions, precautions do apply. ?Overdosage: If you think you have taken too much of this medicine contact a poison control center or emergency room at once. ?NOTE: This medicine is only for you. Do not share this medicine with others. ?What if I miss a dose? ?It is important not to miss your dose. Call your doctor or health care professional if you are unable to keep an appointment. ?What may interact with this medication? ?Interactions have not been studied. ?This list may not describe all possible interactions. Give your health care provider a list of all the medicines, herbs, non-prescription drugs, or dietary supplements you use. Also tell them if you smoke, drink alcohol, or use illegal drugs. Some items may interact with your medicine. ?What should I watch for while using this medication? ?Your condition will be monitored carefully while you are receiving this medicine. ?You may need blood work done while you are taking this medicine. ?Do not become pregnant while taking this medicine or for 4 months after stopping it. Women should inform their doctor if they wish to become pregnant or think they might be pregnant. There is a potential for serious side effects to an unborn child. Talk to your health care professional or pharmacist for more information. Do not breast-feed an infant while taking this medicine or for 4 months after the last dose. ?What side effects may I notice from receiving  this medication? ?Side effects that you should report to your doctor or health care professional as soon as possible: ?allergic reactions like skin rash, itching or hives, swelling of the face, lips, or  tongue ?bloody or black, tarry ?breathing problems ?changes in vision ?chest pain ?chills ?confusion ?constipation ?cough ?diarrhea ?dizziness or feeling faint or lightheaded ?fast or irregular heartbeat ?fever ?flushing ?joint pain ?low blood counts - this medicine may decrease the number of white blood cells, red blood cells and platelets. You may be at increased risk for infections and bleeding. ?muscle pain ?muscle weakness ?pain, tingling, numbness in the hands or feet ?persistent headache ?redness, blistering, peeling or loosening of the skin, including inside the mouth ?signs and symptoms of high blood sugar such as dizziness; dry mouth; dry skin; fruity breath; nausea; stomach pain; increased hunger or thirst; increased urination ?signs and symptoms of kidney injury like trouble passing urine or change in the amount of urine ?signs and symptoms of liver injury like dark urine, light-colored stools, loss of appetite, nausea, right upper belly pain, yellowing of the eyes or skin ?sweating ?swollen lymph nodes ?weight loss ?Side effects that usually do not require medical attention (report to your doctor or health care professional if they continue or are bothersome): ?decreased appetite ?hair loss ?tiredness ?This list may not describe all possible side effects. Call your doctor for medical advice about side effects. You may report side effects to FDA at 1-800-FDA-1088. ?Where should I keep my medication? ?This drug is given in a hospital or clinic and will not be stored at home. ?NOTE: This sheet is a summary. It may not cover all possible information. If you have questions about this medicine, talk to your doctor, pharmacist, or health care provider. ?? 2023 Elsevier/Gold Standard (2021-11-02 00:00:00) ? ?

## 2022-04-16 NOTE — Assessment & Plan Note (Signed)
She has gained some weight since last time I saw her ?We discussed importance of risk factor modification and lifestyle changes ?

## 2022-04-16 NOTE — Telephone Encounter (Signed)
Oral Chemotherapy Pharmacist Encounter ? ?I spoke with patient for overview of: Lenvima (lenvatinib) for the treatment of endometrial cancer in conjunction with pembrolizumab, planned duration until disease progression or unacceptable toxicity.  ? ?Counseled patient on administration, dosing, side effects, monitoring, drug-food interactions, safe handling, storage, and disposal. ? ?Labs from 04/11/22 reviewed. ? ?Patient will take Lenvima '10mg'$  capsules, 1 capsules ('10mg'$ ) by mouth once daily, with or without food, at approximately the same time each day. ? ?Lenvima start date: 04/16/2022 ? ?Adverse effects include but are not limited to: hypertension, hand-foot syndrome, diarrhea, joint pain, fatigue, headache, decreased calcium, proteinuria, increased risk of blood clots, and cardiac conduction issues.   ?Patient will obtain anti diarrheal and alert the office of 4 or more loose stools above baseline. ? ?Patient instructed to notify office of any upcoming invasive procedures.  ?Michel Santee will be held for 6 days prior to scheduled surgery, restart based on healing and clinical judgement.  ? ?Reviewed with patient importance of keeping a medication schedule and plan for any missed doses. No barriers to medication adherence identified. ? ?Medication reconciliation performed and medication/allergy list updated. ? ?Insurance authorization for Michel Santee has been obtained. ?Test claim at the pharmacy revealed copayment $4 for 1st fill of 30 days. ?Patient picked up medication from [facility] on 04/16/2022. ? ?Patient informed the pharmacy will reach out 5-7 days prior to needing next fill of Lenvima to coordinate continued medication acquisition to prevent break in therapy. ? ?All questions answered. ? ?Mrs. Rabbani voiced understanding and appreciation.  ? ?Medication education handout placed in mail for patient. Patient knows to call the office with questions or concerns. Oral Chemotherapy Clinic phone number  provided to patient.  ? ?Drema Halon, PharmD ?Hematology/Oncology Clinical Pharmacist ?[facility] ?[phone] ?04/16/2022   1:46 PM ? ? ?

## 2022-04-16 NOTE — Progress Notes (Signed)
[place] ?OFFICE PROGRESS NOTE ? ?Patient Care Team: ?[name], MD as PCP - General (Family Medicine) ?Croitoru, Dani Gobble, MD as PCP - Cardiology (Cardiology) ? 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 ?Anemia due to antineoplastic chemotherapy ?She has multifactorial anemia, likely due to mild vaginal bleeding and chronic kidney disease ?Observe closely ? ?Diabetes mellitus type 2 in obese [facility]) ?She has gained some weight since last time I saw her ?We discussed importance of risk factor modification and lifestyle changes ? ?Chronic kidney disease, stage 3a ([place]) ?She has chronic kidney disease and is at risk of dehydration and severe anemia ?We will monitor closely ? ?Goals of care, counseling/discussion ?We had numerous discussions about goals of care ?She is interested for treatment ?We discussed the importance of lifestyle modification while on treatment ? ?No orders of the defined types were placed in this encounter. ? ? ?All questions were answered. The patient knows to call the clinic with any problems, questions or concerns. ?The total time spent in the appointment was 40 minutes encounter with patients including review of chart and various tests results, discussions about plan of care and coordination of care plan ?  ?Heath Lark, MD ?04/16/2022 1:30 PM ? ?INTERVAL HISTORY: ?Please see below for problem oriented charting. ?she returns for treatment follow-up seen prior to  chemotherapy with pembrolizumab and lenvatinib for recurrent metastatic uterine cancer  ?She has mild vaginal spotting since her recent biopsy ?Her energy level is fair ? ?REVIEW OF SYSTEMS:   ?Constitutional: Denies fevers, chills or abnormal weight loss ?Eyes: Denies blurriness of vision ?Ears, nose, mouth, throat, and face: Denies mucositis or sore throat ?Respiratory: Denies cough, dyspnea or wheezes ?Cardiovascular: Denies palpitation, chest discomfort or lower extremity swelling ?Gastrointestinal:  Denies nausea, heartburn or change in bowel habits ?Skin: Denies abnormal skin rashes ?Lymphatics: Denies new lymphadenopathy or easy bruising ?Neurological:Denies numbness, tingling or new weaknesses ?Behavioral/Psych: Mood is stable, no new changes  ?All other systems were reviewed with the patient and are negative. ? ?I have reviewed the past medical history, past surgical history, social history and family history with the patient and they are unchanged from previous note. ? ?ALLERGIES:  has No Known Allergies. ? ?MEDICATIONS:  ?Current Outpatient Medications  ?Medication Sig Dispense Refill  ? amLODipine (NORVASC) 5 MG tablet Take 1 tablet by mouth daily. 30 tablet 6  ? aspirin 81 MG EC tablet Take 1 tablet (81 mg total) by mouth daily. 30 tablet 12  ? atorvastatin (LIPITOR) 80 MG tablet TAKE 1 TABLET (80 MG TOTAL) BY MOUTH AT BEDTIME. 30 tablet 6  ? cholestyramine light (PREVALITE) 4 g packet Dissolve in water as directed then take 1 packet by mouth daily at 12 noon. 30 each 0  ? Dulaglutide (TRULICITY) 1.5 BS/9.6GE SOPN Inject 1.5 mg into the skin once a week. 2 mL 6  ? furosemide (LASIX) 20 MG tablet Take 1 tablet (20 mg total) by mouth every other day. 30 tablet 3  ? glimepiride (AMARYL) 4 MG tablet Take 1 tablet (4 mg  total) by mouth daily with breakfast. 30 tablet 6  ? isosorbide mononitrate (IMDUR) 30 MG 24 hr tablet Take 2 tablets (60 mg total) by mouth daily. 60 tablet 6  ? lenvatinib 10 mg daily dose  (LENVIMA, 10 MG DAILY DOSE,) capsule Take 1 capsule (10 mg total) by mouth daily. (Patient not taking: Reported on 04/12/2022) 30 capsule 11  ? lisinopril (ZESTRIL) 20 MG tablet TAKE 1 TABLET BY MOUTH ONCE A DAY 30 tablet 6  ? metFORMIN (GLUCOPHAGE) 500 MG tablet Take 2 tablets (1,000 mg total) by mouth 2 (two) times daily with a meal. 120 tablet 6  ? metoprolol tartrate (LOPRESSOR) 100 MG tablet TAKE 1 TABLET (100 MG TOTAL) BY MOUTH 2 (TWO) TIMES DAILY. 60 tablet 6  ? nitroGLYCERIN (NITROSTAT) 0.4 MG SL tablet Place 1 tablet (0.4 mg total) under the tongue every 5 (five) minutes as needed for chest pain. 25 tablet 2  ? ?No current facility-administered medications for this visit.  ? ? ?SUMMARY OF ONCOLOGIC HISTORY: ?Oncology History Overview Note  ?Pap 12/14/20: adenocarcinoma, HPV negative ? ?HER-2 positive by FISH ?Her-2 equivocal by IHC ? ?MSI stable ? ?  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 Initial Diagnosis  ? Endometrial carcinoma ([place]) ?  ?01/12/2021 Initial Biopsy  ? A. ENDOCERVIX, CURETTAGE:  ?- Adenocarcinoma.  ?B. ENDOMETRIUM, BIOPSY:  ?- Adenocarcinoma.  ?COMMENT:  ?The differential diagnosis includes high grade serous carcinoma.  Both  ?specimens have a similar morphology; high grade serous carcinoma more  ?commonly arises in the endometrium.  Results reported to Dr. Hale Bogus  ?on 01/15/2021.  Dr. Saralyn Pilar reviewed the case.  ?  ?01/16/2021 Tumor Marker  ? Patient's tumor was tested for the following markers: CA-125 ?Results of the tumor marker test revealed normal value, 10.7 ?  ?01/25/2021 Imaging  ? CT C/A/P: ?1. Small volume fluid in the endometrial canal with 7 mm ?hypoattenuating lesion in the myometrium of the anterior fundus. ?2. No definite evidence for metastatic disease in the chest, ?abdomen, or pelvis. Small lymph nodes are noted along both pelvic ?sidewalls. Attention on follow-up recommended. ?3. 5 mm ground-glass opacity peripheral left upper lobe. This is ?likely  related to infection/inflammation and potentially scar. ?Attention on surveillance imaging recommended. ?4. 5.8 cm lesion posterior lower uterine segment likely a fibroid. ?Ultrasound exam from 01/28/2010 demonstrated a 5.2 cm lesion in the ?left aspect of the posterior lower uterine body. ?5. Aortic Atherosclerosis (ICD10-I70.0). ?  ?01/29/2021 Surgery  ? TRH/BSO, SLN biopsy left, selective pelvic LND right, omentectomy ? ?Findings: On EUA, 10-12cm enlarged mobile uterus. On intra-abdomina entry, minimal adhesions between the liver and anterior abdomen on the right. Some changes c/w fatty liver on the left. Omentum, stomach, small and large bowel all grossly normal. Bilateral ovaries normal appearing. Uterus with 5-6cm posterior fibroid, otherwise normal appearing. No mapping to right pelvis. On left, mapping to the level of the superior vessel artery, enlarged lymph node noted (likely sentinel) within the upper aspect of the obturator space. In bilateral pelvic basins, multiple enlarged lymph nodes. On the right, enlarged lymph nodes extended superiorly along the common iliac vessels. ?At the end of surgery, no obvious abdominal or pelvic evidence of disease. ?  ?01/29/2021 Pathology Results  ? A. SENTINEL LYMPH NODE, LEFT INTERNAL ILIAC, BIOPSY:  ?- Metastatic carcinoma in (1) of (1) lymph node.  ? ?B. SENTINEL LYMPH NODE, LEFT OBTURATOR, BIOPSY:  ?- Metastatic carcinoma in (1) of (1) lymph node.  ? ?C. LYMPH NODES, LEFT PELVIC,  DISSECTION:  ?- Metastatic carcinoma in (1) of (3) lymph nodes.  ? ?D. LYMPH NODE, RIGHT EXTERNAL AND COMMON ILIAC, BIOPSY:  ?- Metastatic carcinoma in (1) of (1) lymph node.  ? ?E. UTERUS, CERVIX AND BILATERAL FALLOPIAN TUBES AND OVARIES, TOTAL  ?HYSTERECTOMY AND BILATERAL SALPINGO-OOPHORECTOMY:  ?- High grade serous carcinoma of endometrium, with invasion more than  ?half of the myometrium.  ?- Tumor invades the stromal connective tissue of the cervix.  ?- No involvement of uterine serosa  or adnexa.  ?- Lymphovascular invasion is identified.  ?- See oncology table.  ? ?F. OMENTUM, OMENTECTOMY:  ?- Omentum, negative for carcinoma.  ? ?G. LYMPH NODES, RIGHT PELVIC, DISSECTION:  ?- Two lymph nodes, ne

## 2022-04-16 NOTE — Assessment & Plan Note (Signed)
She has chronic kidney disease and is at risk of dehydration and severe anemia ?We will monitor closely ?

## 2022-04-16 NOTE — Assessment & Plan Note (Signed)
I have reviewed recent biopsy report and CT imaging with the patient and her daughter ?She has cancer recurrence but relatively asymptomatic, except for mild vaginal spotting ?We discussed the risk and benefits of combination chemotherapy with pembrolizumab and lenvatinib ?She is in agreement to proceed ?We discussed the importance of daily blood pressure monitoring ?Due to high risk disease with comorbidities, I will see her next week for toxicity review and supportive care ?

## 2022-04-17 ENCOUNTER — Telehealth: Payer: Self-pay | Admitting: *Deleted

## 2022-04-17 NOTE — Telephone Encounter (Signed)
-----   Message from Regan Rakers, RN sent at 04/16/2022  2:48 PM EDT ----- ?Regarding: first time keytruda/gorsuch ?First time [place], tolerated well.  Dr Alvy Bimler ? ?

## 2022-04-17 NOTE — Telephone Encounter (Signed)
Called pt to see how she did with her treatment yesterday.  She reports doing well with no c/o's.  She also states she knows how to reach [place] with any concerns.   ?

## 2022-04-25 ENCOUNTER — Other Ambulatory Visit (HOSPITAL_COMMUNITY): Payer: Self-pay

## 2022-04-25 ENCOUNTER — Inpatient Hospital Stay (HOSPITAL_BASED_OUTPATIENT_CLINIC_OR_DEPARTMENT_OTHER): Payer: Medicaid Other | Admitting: Hematology and Oncology

## 2022-04-25 DIAGNOSIS — I10 Essential (primary) hypertension: Secondary | ICD-10-CM

## 2022-04-25 DIAGNOSIS — N1831 Chronic kidney disease, stage 3a: Secondary | ICD-10-CM

## 2022-04-25 DIAGNOSIS — C541 Malignant neoplasm of endometrium: Secondary | ICD-10-CM

## 2022-04-25 MED ORDER — AMLODIPINE BESYLATE 10 MG PO TABS
10.0000 mg | ORAL_TABLET | Freq: Every day | ORAL | 1 refills | Status: DC
  Filled 2022-04-25: qty 30, 30d supply, fill #0
  Filled 2022-05-28: qty 30, 30d supply, fill #1

## 2022-04-26 ENCOUNTER — Encounter: Payer: Self-pay | Admitting: Hematology and Oncology

## 2022-04-26 NOTE — Assessment & Plan Note (Signed)
She tolerated recent treatment well ?However, I am still concerned about her difficulties tracking her oral fluid intake, blood pressure and others ?She is made aware that her immunotherapy will not work without Lenvima ?Lenvima causes high blood pressure and if her urine protein is high, her treatment will be discontinued ?I will see her again in 2 weeks for further follow-up prior to cycle 2 of treatment ?

## 2022-04-26 NOTE — Progress Notes (Signed)
[place] ?OFFICE PROGRESS NOTE ? ?Patient Care Team: ?[name], MD as PCP - General (Family Medicine) ?Croitoru, Dani Gobble, MD as PCP - Cardiology (Cardiology) ? 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 ?Hypertension ?She has poorly controlled hypertension ?We discussed the importance of getting her blood pressure monitored and good blood pressure control while on treatment ? ?Chronic kidney disease, stage 3a ([place]) ?She had history of intermittent elevated creatinine due to poor oral fluid intake ?We discussed importance of tracking her oral fluid hydration ? ?No orders of the defined types were placed in this encounter. ? ? ?All questions were answered. The patient knows to call the clinic with any problems, questions or concerns. ?The total time spent in the appointment was 20 minutes encounter with patients including review of chart and various tests results, discussions about plan of care and coordination of care plan ?  ?Heath Lark, MD ?04/26/2022 10:08 AM ? ?INTERVAL HISTORY: ?Please see below for problem oriented charting. ?she returns for treatment follow-up with her daughter ?Her daughter brought with her documented flowsheet from the past 7 days ?On review, the flowsheet documentation is incomplete ?There were missing information regarding her food intake, oral fluid intake, blood pressure and others ?She has not been eating regular healthy food choices at home ?She has not documented regular bowel movement ?Her oral fluid intake is sporadic ?But  overall, she denies side effects from treatment so far ? ?REVIEW OF SYSTEMS:   ?Constitutional: Denies fevers, chills or abnormal weight loss ?Eyes: Denies blurriness of vision ?Ears, nose, mouth, throat, and face: Denies mucositis or sore throat ?Respiratory: Denies cough, dyspnea or wheezes ?Cardiovascular: Denies palpitation, chest discomfort or lower extremity swelling ?Gastrointestinal:  Denies nausea, heartburn or change in bowel habits ?Skin: Denies abnormal skin rashes ?Lymphatics: Denies new lymphadenopathy or easy bruising ?Neurological:Denies numbness, tingling or new weaknesses ?Behavioral/Psych: Mood is stable, no new changes  ?All other systems were reviewed with the patient and are negative. ? ?I have reviewed the past medical history, past surgical history, social history and family history with the patient and they are unchanged from previous note. ? ?ALLERGIES:  has No Known Allergies. ? ?MEDICATIONS:  ?Current Outpatient Medications  ?Medication Sig Dispense Refill  ? amLODipine (NORVASC) 10 MG tablet Take 1 tablet (10 mg total) by mouth daily. 30 tablet 1  ? aspirin 81 MG EC tablet Take 1 tablet (81 mg total) by mouth daily. 30 tablet 12  ? atorvastatin (LIPITOR) 80 MG tablet TAKE 1 TABLET (80 MG TOTAL) BY MOUTH AT BEDTIME. 30 tablet 6  ? cholestyramine light (PREVALITE) 4 g packet Dissolve in water as directed then take 1 packet by mouth daily at 12 noon. 30 each 0  ? Dulaglutide (TRULICITY) 1.5 SW/5.4OE SOPN Inject 1.5 mg into the skin once a week. 2 mL 6  ? furosemide (LASIX) 20 MG tablet Take 1 tablet (20 mg total) by mouth every other day. 30 tablet 3  ? glimepiride (AMARYL) 4 MG tablet Take 1 tablet (4 mg total) by mouth daily with breakfast. 30 tablet 6  ?  isosorbide mononitrate (IMDUR) 30 MG 24 hr tablet Take 2 tablets (60 mg total) by mouth daily. 60 tablet 6  ? lenvatinib 10 mg daily dose (LENVIMA, 10 MG DAILY DOSE,) capsule Take 1 capsule (10 mg total) by mouth daily. (Patient not  taking: Reported on 04/12/2022) 30 capsule 11  ? lisinopril (ZESTRIL) 20 MG tablet TAKE 1 TABLET BY MOUTH ONCE A DAY 30 tablet 6  ? metFORMIN (GLUCOPHAGE) 500 MG tablet Take 2 tablets (1,000 mg total) by mouth 2 (two) times daily with a meal. 120 tablet 6  ? metoprolol tartrate (LOPRESSOR) 100 MG tablet TAKE 1 TABLET (100 MG TOTAL) BY MOUTH 2 (TWO) TIMES DAILY. 60 tablet 6  ? nitroGLYCERIN (NITROSTAT) 0.4 MG SL tablet Place 1 tablet (0.4 mg total) under the tongue every 5 (five) minutes as needed for chest pain. 25 tablet 2  ? ?No current facility-administered medications for this visit.  ? ? ?SUMMARY OF ONCOLOGIC HISTORY: ?Oncology History Overview Note  ?Pap 12/14/20: adenocarcinoma, HPV negative ? ?HER-2 positive by FISH ?Her-2 equivocal by IHC ? ?MSI stable ? ?  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 Initial Diagnosis  ? Endometrial carcinoma ([place]) ?  ?01/12/2021 Initial Biopsy  ? A. ENDOCERVIX, CURETTAGE:  ?- Adenocarcinoma.  ?B. ENDOMETRIUM, BIOPSY:  ?- Adenocarcinoma.  ?COMMENT:  ?The differential diagnosis includes high grade serous carcinoma.  Both  ?specimens have a similar morphology; high grade serous carcinoma more  ?commonly arises in the endometrium.  Results reported to Dr. Hale Bogus  ?on 01/15/2021.  Dr. Saralyn Pilar reviewed the case.  ?  ?01/16/2021 Tumor Marker  ? Patient's tumor was tested for the following markers: CA-125 ?Results of the tumor marker test revealed normal value, 10.7 ?  ?01/25/2021 Imaging  ? CT C/A/P: ?1. Small volume fluid in the endometrial canal with 7 mm ?hypoattenuating lesion in the myometrium of the anterior fundus. ?2. No definite evidence for metastatic disease in the chest, ?abdomen, or pelvis. Small lymph nodes are noted along both pelvic ?sidewalls. Attention on follow-up recommended. ?3. 5 mm ground-glass opacity peripheral left upper lobe. This is ?likely related to infection/inflammation and potentially scar. ?Attention on surveillance imaging  recommended. ?4. 5.8 cm lesion posterior lower uterine segment likely a fibroid. ?Ultrasound exam from 01/28/2010 demonstrated a 5.2 cm lesion in the ?left aspect of the posterior lower uterine body. ?5. Aortic Atherosclerosis (ICD10-I70.0). ?  ?01/29/2021 Surgery  ? TRH/BSO, SLN biopsy left, selective pelvic LND right, omentectomy ? ?Findings: On EUA, 10-12cm enlarged mobile uterus. On intra-abdomina entry, minimal adhesions between the liver and anterior abdomen on the right. Some changes c/w fatty liver on the left. Omentum, stomach, small and large bowel all grossly normal. Bilateral ovaries normal appearing. Uterus with 5-6cm posterior fibroid, otherwise normal appearing. No mapping to right pelvis. On left, mapping to the level of the superior vessel artery, enlarged lymph node noted (likely sentinel) within the upper aspect of the obturator space. In bilateral pelvic basins, multiple enlarged lymph nodes. On the right, enlarged lymph nodes extended superiorly along the common iliac vessels. ?At the end of surgery, no obvious abdominal or pelvic evidence of disease. ?  ?01/29/2021 Pathology Results  ? A. SENTINEL LYMPH NODE, LEFT INTERNAL ILIAC, BIOPSY:  ?- Metastatic carcinoma in (1) of (1) lymph node.  ? ?B. SENTINEL LYMPH NODE, LEFT OBTURATOR, BIOPSY:  ?- Metastatic carcinoma in (1) of (1) lymph node.  ? ?C. LYMPH NODES, LEFT PELVIC, DISSECTION:  ?- Metastatic carcinoma in (1) of (3) lymph nodes.  ? ?  D. LYMPH NODE, RIGHT EXTERNAL AND COMMON ILIAC, BIOPSY:  ?- Metastatic carcinoma in (1) of (1) lymph node.  ? ?E. UTERUS, CERVIX AND BILATERAL FALLOPIAN TUBES AND OVARIES, TOTAL  ?HYSTERECTOMY AND BILATERAL SALPINGO-OOPHORECTOMY:  ?- High grade serous carcinoma of endometrium, with invasion more than  ?half of the myometrium.  ?- Tumor invades the stromal connective tissue of the cervix.  ?- No involvement of uterine serosa or adnexa.  ?- Lymphovascular invasion is identified.  ?- See oncology table.  ? ?F.  OMENTUM, OMENTECTOMY:  ?- Omentum, negative for carcinoma.  ? ?G. LYMPH NODES, RIGHT PELVIC, DISSECTION:  ?- Two lymph nodes, negative for carcinoma (0/2).  ? ?ONCOLOGY TABLE:  ? ?UTERUS, CARCINOMA OR CARCINOSARCOMA: Res

## 2022-04-26 NOTE — Assessment & Plan Note (Signed)
She had history of intermittent elevated creatinine due to poor oral fluid intake ?We discussed importance of tracking her oral fluid hydration ?

## 2022-04-26 NOTE — Assessment & Plan Note (Signed)
She has poorly controlled hypertension ?We discussed the importance of getting her blood pressure monitored and good blood pressure control while on treatment ?

## 2022-04-29 ENCOUNTER — Other Ambulatory Visit (HOSPITAL_COMMUNITY): Payer: Self-pay

## 2022-04-30 ENCOUNTER — Telehealth: Payer: Self-pay

## 2022-04-30 NOTE — Telephone Encounter (Signed)
Called and given below message to [place]. She verbalized understanding. ? ?5/14 am bp 148/96 ?5/15 am bp 117/81 ?5/16 am bp 126/84. ?They will continue checking bp. ?

## 2022-04-30 NOTE — Telephone Encounter (Signed)
Ok looks good ?

## 2022-04-30 NOTE — Telephone Encounter (Signed)
-----   Message from Heath Lark, MD sent at 04/30/2022  8:35 AM EDT ----- ?Can you call her or [name] to get BP readings? ? ?

## 2022-05-01 ENCOUNTER — Telehealth: Payer: Self-pay | Admitting: *Deleted

## 2022-05-01 NOTE — Telephone Encounter (Signed)
? ?  Pre-operative Risk Assessment  ?  ?Patient Name: [name]  ?DOB: 02-10-1956 ?MRN: [id number]  ? ?  ? ?Request for Surgical Clearance   ? ?Procedure:   vitrectomy  ? ?Date of Surgery:  Clearance 05/23/22                              ?   ?Surgeon:  Dr. Baird Cancer ?Surgeon's Group or Practice Name:  [facility] ?Phone number:  [phone] ?Fax number:  [phone] ?  ?Type of Clearance Requested:   ?- Pharmacy:  Hold Aspirin for 7 days prior ?  ?Type of Anesthesia:  MAC ?  ?Additional requests/questions:   ? ?Signed, ?Esthefany Herrig A Aron Inge   ?05/01/2022, 2:53 PM  ? ?

## 2022-05-02 NOTE — Telephone Encounter (Signed)
   Patient Name: [name]  DOB: 01/14/1956 MRN: [id number]  Primary Cardiologist: Sanda Klein, MD  Chart reviewed as part of pre-operative protocol coverage.  Patient had recent OV 04/12/22, with echo planned 05/27/22. Will route to Dr. Sallyanne Kuster for input on holding ASA if felt acceptable, otherwise appears from note she was relatively well compensated without any new concerns or symptoms. 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 Dr. Sallyanne Kuster - Please route response to P CV DIV PREOP (the pre-op pool). Thank you.   Charlie Pitter, PA-C 05/02/2022, 1:28 PM

## 2022-05-02 NOTE — Telephone Encounter (Signed)
OK to hold ASA for 5-7 days for the procedure

## 2022-05-03 ENCOUNTER — Telehealth: Payer: Self-pay

## 2022-05-03 ENCOUNTER — Ambulatory Visit (HOSPITAL_COMMUNITY): Payer: Medicaid Other

## 2022-05-03 ENCOUNTER — Other Ambulatory Visit: Payer: Medicaid Other

## 2022-05-03 NOTE — Telephone Encounter (Signed)
    Patient Name: [name]  DOB: 20-Mar-1956 MRN: [id number]  Primary Cardiologist: Sanda Klein, MD  Chart reviewed as part of pre-operative protocol coverage. Given past medical history and time since last visit, based on ACC/AHA guidelines, [name] would be at acceptable risk for the planned procedure without further cardiovascular testing. I spoke with patient who affirms no new cardiac symptoms since recent OV. Per Dr. Sallyanne Kuster, OK to hold ASA for the procedure as requested. The patient was advised that if she develops new symptoms prior to surgery to contact our office to arrange for a follow-up visit, and she verbalized understanding.  I will route this recommendation to the requesting party via Epic fax function and remove from pre-op pool.  Please call with questions.  Charlie Pitter, PA-C 05/03/2022, 8:56 AM

## 2022-05-03 NOTE — Telephone Encounter (Signed)
Called regarding medical clearance form faxed to office. Told Dr. Alvy Bimler does not provide medical clearance. She can send something related to a oncology standpoint if needed. Per office staff they will reach out to PCP.

## 2022-05-06 ENCOUNTER — Other Ambulatory Visit (HOSPITAL_COMMUNITY): Payer: Self-pay

## 2022-05-07 ENCOUNTER — Inpatient Hospital Stay: Payer: Medicaid Other

## 2022-05-07 ENCOUNTER — Encounter: Payer: Self-pay | Admitting: Hematology and Oncology

## 2022-05-07 ENCOUNTER — Ambulatory Visit: Payer: Medicaid Other | Admitting: Hematology and Oncology

## 2022-05-07 ENCOUNTER — Inpatient Hospital Stay (HOSPITAL_BASED_OUTPATIENT_CLINIC_OR_DEPARTMENT_OTHER): Payer: Medicaid Other | Admitting: Hematology and Oncology

## 2022-05-07 ENCOUNTER — Other Ambulatory Visit (HOSPITAL_COMMUNITY): Payer: Self-pay

## 2022-05-07 DIAGNOSIS — T451X5A Adverse effect of antineoplastic and immunosuppressive drugs, initial encounter: Secondary | ICD-10-CM

## 2022-05-07 DIAGNOSIS — E1169 Type 2 diabetes mellitus with other specified complication: Secondary | ICD-10-CM

## 2022-05-07 DIAGNOSIS — E669 Obesity, unspecified: Secondary | ICD-10-CM

## 2022-05-07 DIAGNOSIS — C541 Malignant neoplasm of endometrium: Secondary | ICD-10-CM

## 2022-05-07 DIAGNOSIS — N1831 Chronic kidney disease, stage 3a: Secondary | ICD-10-CM

## 2022-05-07 DIAGNOSIS — D6481 Anemia due to antineoplastic chemotherapy: Secondary | ICD-10-CM

## 2022-05-07 DIAGNOSIS — I10 Essential (primary) hypertension: Secondary | ICD-10-CM

## 2022-05-07 LAB — CMP (CANCER CENTER ONLY)
ALT: 79 U/L — ABNORMAL HIGH (ref 0–44)
AST: 44 U/L — ABNORMAL HIGH (ref 15–41)
Albumin: 3.4 g/dL — ABNORMAL LOW (ref 3.5–5.0)
Alkaline Phosphatase: 84 U/L (ref 38–126)
Anion gap: 8 (ref 5–15)
BUN: 15 mg/dL (ref 8–23)
CO2: 25 mmol/L (ref 22–32)
Calcium: 9.2 mg/dL (ref 8.9–10.3)
Chloride: 107 mmol/L (ref 98–111)
Creatinine: 1.14 mg/dL — ABNORMAL HIGH (ref 0.44–1.00)
GFR, Estimated: 54 mL/min — ABNORMAL LOW (ref >60)
Glucose, Bld: 136 mg/dL — ABNORMAL HIGH (ref 70–99)
Potassium: 3.9 mmol/L (ref 3.5–5.1)
Sodium: 140 mmol/L (ref 135–145)
Total Bilirubin: 0.4 mg/dL (ref 0.3–1.2)
Total Protein: 7.7 g/dL (ref 6.5–8.1)

## 2022-05-07 LAB — CBC WITH DIFFERENTIAL (CANCER CENTER ONLY)
Abs Immature Granulocytes: 0.02 10*3/uL (ref 0.00–0.07)
Basophils Absolute: 0 10*3/uL (ref 0.0–0.1)
Basophils Relative: 0 %
Eosinophils Absolute: 0 10*3/uL (ref 0.0–0.5)
Eosinophils Relative: 1 %
HCT: 29.3 % — ABNORMAL LOW (ref 36.0–46.0)
Hemoglobin: 9.8 g/dL — ABNORMAL LOW (ref 12.0–15.0)
Immature Granulocytes: 0 %
Lymphocytes Relative: 26 %
Lymphs Abs: 1.5 10*3/uL (ref 0.7–4.0)
MCH: 30.2 pg (ref 26.0–34.0)
MCHC: 33.4 g/dL (ref 30.0–36.0)
MCV: 90.4 fL (ref 80.0–100.0)
Monocytes Absolute: 0.5 10*3/uL (ref 0.1–1.0)
Monocytes Relative: 9 %
Neutro Abs: 3.5 10*3/uL (ref 1.7–7.7)
Neutrophils Relative %: 64 %
Platelet Count: 256 10*3/uL (ref 150–400)
RBC: 3.24 MIL/uL — ABNORMAL LOW (ref 3.87–5.11)
RDW: 15.1 % (ref 11.5–15.5)
WBC Count: 5.5 10*3/uL (ref 4.0–10.5)
nRBC: 0 % (ref 0.0–0.2)

## 2022-05-07 LAB — TSH: TSH: 1.74 u[IU]/mL (ref 0.350–4.500)

## 2022-05-07 MED ORDER — SODIUM CHLORIDE 0.9 % IV SOLN: Freq: Once | INTRAVENOUS | Status: AC

## 2022-05-07 MED ORDER — SODIUM CHLORIDE 0.9% FLUSH
10.0000 mL | INTRAVENOUS | Status: DC | PRN
  Administered 2022-05-07: 10 mL

## 2022-05-07 MED ORDER — HEPARIN SOD (PORK) LOCK FLUSH 100 UNIT/ML IV SOLN
500.0000 [IU] | Freq: Once | INTRAVENOUS | Status: AC | PRN
  Administered 2022-05-07: 500 [IU]

## 2022-05-07 MED ORDER — SODIUM CHLORIDE 0.9% FLUSH
10.0000 mL | Freq: Once | INTRAVENOUS | Status: AC
  Administered 2022-05-07: 10 mL

## 2022-05-07 MED ORDER — SODIUM CHLORIDE 0.9 % IV SOLN
200.0000 mg | Freq: Once | INTRAVENOUS | Status: AC
  Administered 2022-05-07: 200 mg via INTRAVENOUS
  Filled 2022-05-07: qty 200

## 2022-05-07 NOTE — Progress Notes (Signed)
[name] OFFICE PROGRESS NOTE  Patient Care Team: Elizabeth Rakes, MD as PCP - General (Family Medicine) Croitoru, Dani Gobble, MD as PCP - Cardiology (Cardiology)  ASSESSMENT & PLAN:  Papillary serous adenocarcinoma of endometrium [facility]) She tolerated recent treatment well She continues to have intermittent suprapubic cramping and very light vaginal bleeding We will proceed with treatment as scheduled I continue to eat and reinforced the importance of adequate oral intake, hydration and blood pressure monitoring  Anemia due to antineoplastic chemotherapy She has multifactorial anemia, likely due to mild vaginal bleeding and chronic kidney disease Observe closely  Chronic kidney disease, stage 3a ([place]) She has chronic kidney disease multifactorial related to her diabetes and uncontrolled hypertension We will monitor closely I reinforced the importance of adequate fluid hydration and blood pressure control  Diabetes mellitus type 2 in obese ([place]) I have reviewed documentation of oral fluid intake She is not eating enough protein and eating too much carbohydrates We discussed importance of dietary modification while on treatment  Hypertension She has history of poorly controlled hypertension Her blood pressure documentation over the past 10 days were reviewed and they were satisfactory She will continue her current antihypertensives  No orders of the defined types were placed in this encounter.   All questions were answered. The patient knows to call the clinic with any problems, questions or concerns. The total time spent in the appointment was 30 minutes encounter with patients including review of chart and various tests results, discussions about plan of care and coordination of care plan   Elizabeth Lark, MD 05/07/2022 1:19 PM  INTERVAL HISTORY: Please see below for problem oriented charting. she returns for treatment follow-up seen prior to cycle 2 of  treatment She is here accompanied by her daughter She brought with her flowsheet that documented her oral intake She is consistently eating breakfast and dinner and only snack during lunchtime Her food choices indicated too much carbohydrates She is having daily bowel movement and normal blood pressure She is drinking more liquids per day She has occasional abdominal cramping Her vaginal bleeding is getting lighter  REVIEW OF SYSTEMS:   Constitutional: Denies fevers, chills or abnormal weight loss Eyes: Denies blurriness of vision Ears, nose, mouth, throat, and face: Denies mucositis or sore throat Respiratory: Denies cough, dyspnea or wheezes Cardiovascular: Denies palpitation, chest discomfort or lower extremity swelling Gastrointestinal:  Denies nausea, heartburn or change in bowel habits Skin: Denies abnormal skin rashes Lymphatics: Denies new lymphadenopathy or easy bruising Neurological:Denies numbness, tingling or new weaknesses Behavioral/Psych: Mood is stable, no new changes  All other systems were reviewed with the patient and are negative.  I have reviewed the past medical history, past surgical history, social history and family history with the patient and they are unchanged from previous note.  ALLERGIES:  has No Known Allergies.  MEDICATIONS:  Current Outpatient Medications  Medication Sig Dispense Refill   amLODipine (NORVASC) 10 MG tablet Take 1 tablet (10 mg total) by mouth daily. 30 tablet 1   aspirin 81 MG EC tablet Take 1 tablet (81 mg total) by mouth daily. 30 tablet 12   atorvastatin (LIPITOR) 80 MG tablet TAKE 1 TABLET (80 MG TOTAL) BY MOUTH AT BEDTIME. 30 tablet 6   cholestyramine light (PREVALITE) 4 g packet Dissolve in water as directed then take 1 packet by mouth daily at 12 noon. 30 each 0   Dulaglutide (TRULICITY) 1.5 GO/1.1XB SOPN Inject 1.5 mg into the skin once a week. 2 mL 6  furosemide (LASIX) 20 MG tablet Take 1 tablet (20 mg total) by mouth  every other day. 30 tablet 3   glimepiride (AMARYL) 4 MG tablet Take 1 tablet (4 mg total) by mouth daily with breakfast. 30 tablet 6   isosorbide mononitrate (IMDUR) 30 MG 24 hr tablet Take 2 tablets (60 mg total) by mouth daily. 60 tablet 6   lenvatinib 10 mg daily dose (LENVIMA, 10 MG DAILY DOSE,) capsule Take 1 capsule (10 mg total) by mouth daily. (Patient not taking: Reported on 04/12/2022) 30 capsule 11   lisinopril (ZESTRIL) 20 MG tablet TAKE 1 TABLET BY MOUTH ONCE A DAY 30 tablet 6   metFORMIN (GLUCOPHAGE) 500 MG tablet Take 2 tablets (1,000 mg total) by mouth 2 (two) times daily with a meal. 120 tablet 6   metoprolol tartrate (LOPRESSOR) 100 MG tablet TAKE 1 TABLET (100 MG TOTAL) BY MOUTH 2 (TWO) TIMES DAILY. 60 tablet 6   nitroGLYCERIN (NITROSTAT) 0.4 MG SL tablet Place 1 tablet (0.4 mg total) under the tongue every 5 (five) minutes as needed for chest pain. 25 tablet 2   No current facility-administered medications for this visit.   Facility-Administered Medications Ordered in Other Visits  Medication Dose Route Frequency Provider Last Rate Last Admin   heparin lock flush 100 unit/mL  500 Units Intracatheter Once PRN Elizabeth Bimler, Aryav Wimberly, MD       pembrolizumab (KEYTRUDA) 200 mg in sodium chloride 0.9 % 50 mL chemo infusion  200 mg Intravenous Once Elizabeth Bimler, Kalany Diekmann, MD 116 mL/hr at 05/07/22 1254 200 mg at 05/07/22 1254   sodium chloride flush (NS) 0.9 % injection 10 mL  10 mL Intracatheter PRN Elizabeth Lark, MD        SUMMARY OF ONCOLOGIC HISTORY: Oncology History Overview Note  Pap 12/14/20: adenocarcinoma, HPV negative  HER-2 positive by FISH Her-2 equivocal by [facility]  MSI stable    Papillary serous adenocarcinoma of endometrium ([place])   Initial Diagnosis   Endometrial carcinoma ([place])   01/12/2021 Initial Biopsy   A. ENDOCERVIX, CURETTAGE:  - Adenocarcinoma.  B. ENDOMETRIUM, BIOPSY:  - Adenocarcinoma.  COMMENT:  The differential diagnosis includes high grade serous carcinoma.  Both   specimens have a similar morphology; high grade serous carcinoma more  commonly arises in the endometrium.  Results reported to Dr. Hale [name]  on 01/15/2021.  Dr. Saralyn Pilar reviewed the case.    01/16/2021 Tumor Marker   Patient's tumor was tested for the following markers: CA-125 Results of the tumor marker test revealed normal value, 10.7   01/25/2021 Imaging   CT C/A/P: 1. Small volume fluid in the endometrial canal with 7 mm hypoattenuating lesion in the myometrium of the anterior fundus. 2. No definite evidence for metastatic disease in the chest, abdomen, or pelvis. Small lymph nodes are noted along both pelvic sidewalls. Attention on follow-up recommended. 3. 5 mm ground-glass opacity peripheral left upper lobe. This is likely related to infection/inflammation and potentially scar. Attention on surveillance imaging recommended. 4. 5.8 cm lesion posterior lower uterine segment likely a fibroid. Ultrasound exam from 01/28/2010 demonstrated a 5.2 cm lesion in the left aspect of the posterior lower uterine body. 5. Aortic Atherosclerosis (ICD10-I70.0).   01/29/2021 Surgery   TRH/BSO, SLN biopsy left, selective pelvic LND right, omentectomy  Findings: On EUA, 10-12cm enlarged mobile uterus. On intra-abdomina entry, minimal adhesions between the liver and anterior abdomen on the right. Some changes c/w fatty liver on the left. Omentum, stomach, small and large bowel all grossly normal. Bilateral ovaries  normal appearing. Uterus with 5-6cm posterior fibroid, otherwise normal appearing. No mapping to right pelvis. On left, mapping to the level of the superior vessel artery, enlarged lymph node noted (likely sentinel) within the upper aspect of the obturator space. In bilateral pelvic basins, multiple enlarged lymph nodes. On the right, enlarged lymph nodes extended superiorly along the common iliac vessels. At the end of surgery, no obvious abdominal or pelvic evidence of disease.    01/29/2021 Pathology Results   A. SENTINEL LYMPH NODE, LEFT INTERNAL ILIAC, BIOPSY:  - Metastatic carcinoma in (1) of (1) lymph node.   B. SENTINEL LYMPH NODE, LEFT OBTURATOR, BIOPSY:  - Metastatic carcinoma in (1) of (1) lymph node.   C. LYMPH NODES, LEFT PELVIC, DISSECTION:  - Metastatic carcinoma in (1) of (3) lymph nodes.   D. LYMPH NODE, RIGHT EXTERNAL AND COMMON ILIAC, BIOPSY:  - Metastatic carcinoma in (1) of (1) lymph node.   E. UTERUS, CERVIX AND BILATERAL FALLOPIAN TUBES AND OVARIES, TOTAL  HYSTERECTOMY AND BILATERAL SALPINGO-OOPHORECTOMY:  - High grade serous carcinoma of endometrium, with invasion more than  half of the myometrium.  - Tumor invades the stromal connective tissue of the cervix.  - No involvement of uterine serosa or adnexa.  - Lymphovascular invasion is identified.  - See oncology table.   F. OMENTUM, OMENTECTOMY:  - Omentum, negative for carcinoma.   G. LYMPH NODES, RIGHT PELVIC, DISSECTION:  - Two lymph nodes, negative for carcinoma (0/2).   ONCOLOGY TABLE:   UTERUS, CARCINOMA OR CARCINOSARCOMA: Resection   Procedure: Total hysterectomy and bilateral salpingo-oophorectomy,  Omentectomy, Lymph node sampling  Histologic Type: Serous carcinoma  Histologic Grade: High grade  Myometrial Invasion: > 50%  Uterine Serosa Involvement: Not identified  Cervical Stroma Involvement: Present  Other Tissue/Organ Involvement: Not identified  Peritoneal/Ascitic Fluid: Negative for carcinoma  Lymphovascular Invasion: Present  Regional Lymph Nodes:       Pelvic Lymph Nodes Examined:            2 Sentinel            6 Non-Sentinel            8 Total       Pelvic Lymph Nodes with Metastasis: 4            Macrometastasis: 3            Micrometastasis: 1            Isolated Tumor Cells: 0            Laterality of Lymph Nodes with Tumor: Right (non-sentinel),  Left (sentinel and non-sentinel)            Extracapsular Extension: Present       Para-Aortic  Lymph Nodes Examined: Not applicable        Para-Aortic Lymph Nodes with Metastasis: Not applicable  Distant Metastasis:       Distant Site(s) Involved: Omentum: Not involved  Pathologic Stage Classification (pTNM, AJCC 8th Edition): pT2, pN1a  Ancillary Studies: HER2 will be ordered  Additional Findings: Leiomyomata.  Adenomyosis.  Representative Tumor Block: B1  Comment: Dr. Saralyn Pilar reviewed select slides.  (v4.2.0.1)    01/29/2021 Cancer Staging   Staging form: Corpus Uteri - Carcinoma and Carcinosarcoma, AJCC 8th Edition - Clinical stage from 01/29/2021: FIGO Stage IIIC1 (cT1b, cN1a, cM0) - Signed by Lafonda Mosses, MD on 02/02/2021 Histopathologic type: Mixed cell adenocarcinoma Stage prefix: Initial diagnosis Method of lymph node assessment: Other Histologic grade (G): G3 Histologic grading  system: 3 grade system Lymph-vascular invasion (LVI): LVI present/identified, NOS Peritoneal cytology results: Negative Pelvic nodal status: Positive Number of pelvic nodes positive from dissection: 4 Number of pelvic nodes examined during dissection: 8 Para-aortic status: Not assessed Lymph node metastasis: Present Omentectomy performed: Yes Morcellation performed: No    02/19/2021 Echocardiogram    1. Left ventricular ejection fraction, by estimation, is 55 to 60%. The left ventricle has normal function. The left ventricle demonstrates regional wall motion abnormalities (see scoring diagram/findings for description). There is mild left ventricular  hypertrophy. Left ventricular diastolic parameters are consistent with Grade I diastolic dysfunction (impaired relaxation). There is moderate hypokinesis of the left ventricular, basal septal wall and inferior wall. The average left ventricular global longitudinal strain is -17.9 %. The global longitudinal strain is abnormal.  2. Right ventricular systolic function is normal. The right ventricular size is normal. There is normal pulmonary artery  systolic pressure. The estimated right ventricular systolic pressure is 09.7 mmHg.  3. The mitral valve is abnormal. Trivial mitral valve regurgitation.  4. The aortic valve is tricuspid. Aortic valve regurgitation is not visualized.  5. The inferior vena cava is normal in size with greater than 50% respiratory variability, suggesting right atrial pressure of 3 mmHg.   02/20/2021 Procedure   Successful placement of a right IJ approach Power Port with ultrasound and fluoroscopic guidance. The catheter is ready for use.   02/28/2021 - 06/14/2021 Chemotherapy    Patient is on Treatment Plan: UTERINE CARBOPLATIN AUC 6 / PACLITAXEL Q21D       04/20/2021 Imaging   Status post hysterectomy and suspected bilateral salpingo-oophorectomy.   2.2 cm fluid density lesion along the left pelvic sidewall likely reflects a postoperative seroma.   No findings suspicious for recurrent or metastatic disease.   07/16/2021 - 08/16/2021 Radiation Therapy   Radiation Treatment Dates: 07/16/2021 through 08/16/2021 Site Technique Total Dose (Gy) Dose per Fx (Gy) Completed Fx Beam Energies  Vagina: Pelvis HDR-brachy 30/30 6 5/5 Ir-192        10/01/2021 Imaging   Increasing size of LEFT pelvic lymph nodes, largest approximately 11 mm along the external iliac chain. Given findings and history could consider PET for further evaluation as warranted.   Cystic area along the LEFT pelvic sidewall that was seen previously has improved.   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   Aortic Atherosclerosis (ICD10-I70.0).   12/21/2021 Imaging   1. Enlarging soft tissue masses involving the vaginal cuff. 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 Speculum examination and re-biopsy may be confirmatory. PET-CT may be helpful. 2. New small sub 5 mm perirectal and sigmoid mesocolon nodes. 3. Slight progression of  pelvic adenopathy as detailed above. 4. No findings suspicious for abdominal metastatic disease or osseous metastatic disease. 5. Stable age advanced atherosclerotic calcifications involving the aorta and branch vessels and coronary arteries   12/25/2021 Echocardiogram    1. Global longitudinal strain is -16.1% Mild basal inferior hypoinesis. Compared to echo from 02/2021, no change in overall LVEF /regional wall motion. Global longitudinal strain is mildly less negative (-17.9% to -16.1%. Left ventricular ejection fraction, by estimation, is 65 to 70%. The left ventricle has normal function. The left ventricle has no regional wall motion abnormalities. There is mild left ventricular hypertrophy. Left ventricular diastolic parameters are indeterminate.  2. Right ventricular systolic function is normal. The right ventricular size is normal. There is normal  pulmonary artery systolic pressure.  3. Trivial mitral valve regurgitation.  4. The aortic valve is normal in structure. Aortic valve regurgitation is not visualized.  5. The inferior vena cava is normal in size with greater than 50% respiratory variability, suggesting right atrial pressure of 3 mmHg.     01/01/2022 - 01/22/2022 Chemotherapy   Patient is on Treatment Plan : UTERINE SEROUS CARCINOMA Carboplatin + Paclitaxel + Trastuzumab q21d x 6 Cycles / Trastuzumab q21d      03/29/2022 Pathology Results   A.   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   COMMENT:   The carcinoma has diffuse strong Pax8 immunoreactivity (Mullerian marker) and abnormal overexpression of nuclear p53 and relatively diffuse strong p16 staining.  Estrogen receptor (ER) is weakly reactive  and there is focal strong Napsin A immunoreactivity.  The immunophenotype is most characteristic of serous carcinoma.   The history of prior treatment  for endometrial serous carcinoma is noted.   04/12/2022 Imaging   1. Surgical change of prior hysterectomy with increased nodular thickening at the vaginal cuff, suspicious for recurrent disease. Consider further evaluation with PET-CT versus direct tissue sampling. 2. Unchanged prominent/mildly enlarged retroperitoneal and pelvic lymph nodes, nonspecific but in the setting of disease recurrence these would be at least somewhat suspicious for nodal involvement. Consider attention on follow-up imaging. 3. No evidence of metastatic disease within the chest. 4. Left-sided colonic diverticulosis without findings of acute diverticulitis. 5.  Aortic Atherosclerosis (ICD10-I70.0).   04/16/2022 -  Chemotherapy   Patient is on Treatment Plan : UTERINE Lenvatinib (20) D1-21 + Pembrolizumab (200) D1 q21d        PHYSICAL EXAMINATION: ECOG PERFORMANCE STATUS: 1 - Symptomatic but completely ambulatory  Vitals:   05/07/22 1140  BP: 132/75  Pulse: 82  Resp: 18  Temp: 97.7 F (36.5 C)  SpO2: 100%   Filed Weights   05/07/22 1140  Weight: 209 lb (94.8 kg)    GENERAL:alert, no distress and comfortable NEURO: alert & oriented x 3 with fluent speech, no focal motor/sensory deficits  LABORATORY DATA:  I have reviewed the data as listed    Component Value Date/Time   NA 140 05/07/2022 1106   NA 143 10/18/2020 1028   K 3.9 05/07/2022 1106   CL 107 05/07/2022 1106   CO2 25 05/07/2022 1106   GLUCOSE 136 (H) 05/07/2022 1106   BUN 15 05/07/2022 1106   BUN 13 10/18/2020 1028   CREATININE 1.14 (H) 05/07/2022 1106   CREATININE 0.79 02/11/2017 0943   CALCIUM 9.2 05/07/2022 1106   PROT 7.7 05/07/2022 1106   PROT 6.7 10/18/2020 1028   ALBUMIN 3.4 (L) 05/07/2022 1106   ALBUMIN 4.1 10/18/2020 1028   AST 44 (H) 05/07/2022 1106   ALT 79 (H) 05/07/2022 1106   ALKPHOS 84 05/07/2022 1106   BILITOT 0.4 05/07/2022 1106   GFRNONAA 54 (L) 05/07/2022 1106   GFRNONAA 83 02/11/2017 0943   GFRAA 70  10/18/2020 1028   GFRAA >89 02/11/2017 0943    No results found for: SPEP, UPEP  Lab Results  Component Value Date   WBC 5.5 05/07/2022   NEUTROABS 3.5 05/07/2022   HGB 9.8 (L) 05/07/2022   HCT 29.3 (L) 05/07/2022   MCV 90.4 05/07/2022   PLT 256 05/07/2022      Chemistry      Component Value Date/Time   NA 140 05/07/2022 1106  NA 143 10/18/2020 1028   K 3.9 05/07/2022 1106   CL 107 05/07/2022 1106   CO2 25 05/07/2022 1106   BUN 15 05/07/2022 1106   BUN 13 10/18/2020 1028   CREATININE 1.14 (H) 05/07/2022 1106   CREATININE 0.79 02/11/2017 0943      Component Value Date/Time   CALCIUM 9.2 05/07/2022 1106   ALKPHOS 84 05/07/2022 1106   AST 44 (H) 05/07/2022 1106   ALT 79 (H) 05/07/2022 1106   BILITOT 0.4 05/07/2022 1106

## 2022-05-07 NOTE — Patient Instructions (Signed)
[place] Discharge Instructions for Patients Receiving Chemotherapy  Today you received the following chemotherapy agents Beryle Flock   To help prevent nausea and vomiting after your treatment, we encourage you to take your nausea medication as directed   If you develop nausea and vomiting that is not controlled by your nausea medication, call the clinic.   BELOW ARE SYMPTOMS THAT SHOULD BE REPORTED IMMEDIATELY: *FEVER GREATER THAN 100.5 F *CHILLS WITH OR WITHOUT FEVER NAUSEA AND VOMITING THAT IS NOT CONTROLLED WITH YOUR NAUSEA MEDICATION *UNUSUAL SHORTNESS OF BREATH *UNUSUAL BRUISING OR BLEEDING TENDERNESS IN MOUTH AND THROAT WITH OR WITHOUT PRESENCE OF ULCERS *URINARY PROBLEMS *BOWEL PROBLEMS UNUSUAL RASH Items with * indicate a potential emergency and should be followed up as soon as possible.  Feel free to call the clinic you have any questions or concerns. The clinic phone number is [phone].  Pembrolizumab injection What is this medication? PEMBROLIZUMAB (pem broe liz ue mab) is a monoclonal antibody. It is used to treat certain types of cancer. This medicine may be used for other purposes; ask your health care provider or pharmacist if you have questions. COMMON BRAND NAME(S): Keytruda What should I tell my care team before I take this medication? They need to know if you have any of these conditions: autoimmune diseases like Crohn's disease, ulcerative colitis, or lupus have had or planning to have an allogeneic stem cell transplant (uses someone else's stem cells) history of organ transplant history of chest radiation nervous system problems like myasthenia gravis or Guillain-Barre syndrome an unusual or allergic reaction to pembrolizumab, other medicines, foods, dyes, or preservatives pregnant or trying to get pregnant breast-feeding How should I use this medication? This medicine is for infusion into a vein. It is given by a health care  professional in a hospital or clinic setting. A special MedGuide will be given to you before each treatment. Be sure to read this information carefully each time. Talk to your pediatrician regarding the use of this medicine in children. While this drug may be prescribed for children as young as 6 months for selected conditions, precautions do apply. Overdosage: If you think you have taken too much of this medicine contact a poison control center or emergency room at once. NOTE: This medicine is only for you. Do not share this medicine with others. What if I miss a dose? It is important not to miss your dose. Call your doctor or health care professional if you are unable to keep an appointment. What may interact with this medication? Interactions have not been studied. This list may not describe all possible interactions. Give your health care provider a list of all the medicines, herbs, non-prescription drugs, or dietary supplements you use. Also tell them if you smoke, drink alcohol, or use illegal drugs. Some items may interact with your medicine. What should I watch for while using this medication? Your condition will be monitored carefully while you are receiving this medicine. You may need blood work done while you are taking this medicine. Do not become pregnant while taking this medicine or for 4 months after stopping it. Women should inform their doctor if they wish to become pregnant or think they might be pregnant. There is a potential for serious side effects to an unborn child. Talk to your health care professional or pharmacist for more information. Do not breast-feed an infant while taking this medicine or for 4 months after the last dose. What side effects may I notice from receiving  this medication? Side effects that you should report to your doctor or health care professional as soon as possible: allergic reactions like skin rash, itching or hives, swelling of the face, lips, or  tongue bloody or black, tarry breathing problems changes in vision chest pain chills confusion constipation cough diarrhea dizziness or feeling faint or lightheaded fast or irregular heartbeat fever flushing joint pain low blood counts - this medicine may decrease the number of white blood cells, red blood cells and platelets. You may be at increased risk for infections and bleeding. muscle pain muscle weakness pain, tingling, numbness in the hands or feet persistent headache redness, blistering, peeling or loosening of the skin, including inside the mouth signs and symptoms of high blood sugar such as dizziness; dry mouth; dry skin; fruity breath; nausea; stomach pain; increased hunger or thirst; increased urination signs and symptoms of kidney injury like trouble passing urine or change in the amount of urine signs and symptoms of liver injury like dark urine, light-colored stools, loss of appetite, nausea, right upper belly pain, yellowing of the eyes or skin sweating swollen lymph nodes weight loss Side effects that usually do not require medical attention (report to your doctor or health care professional if they continue or are bothersome): decreased appetite hair loss tiredness This list may not describe all possible side effects. Call your doctor for medical advice about side effects. You may report side effects to FDA at 1-800-FDA-1088. Where should I keep my medication? This drug is given in a hospital or clinic and will not be stored at home. NOTE: This sheet is a summary. It may not cover all possible information. If you have questions about this medicine, talk to your doctor, pharmacist, or health care provider.  2023 Elsevier/Gold Standard (2021-11-02 00:00:00)

## 2022-05-07 NOTE — Assessment & Plan Note (Signed)
She has history of poorly controlled hypertension Her blood pressure documentation over the past 10 days were reviewed and they were satisfactory She will continue her current antihypertensives

## 2022-05-07 NOTE — Assessment & Plan Note (Signed)
She tolerated recent treatment well She continues to have intermittent suprapubic cramping and very light vaginal bleeding We will proceed with treatment as scheduled I continue to eat and reinforced the importance of adequate oral intake, hydration and blood pressure monitoring

## 2022-05-07 NOTE — Assessment & Plan Note (Signed)
I have reviewed documentation of oral fluid intake She is not eating enough protein and eating too much carbohydrates We discussed importance of dietary modification while on treatment

## 2022-05-07 NOTE — Assessment & Plan Note (Signed)
She has chronic kidney disease multifactorial related to her diabetes and uncontrolled hypertension We will monitor closely I reinforced the importance of adequate fluid hydration and blood pressure control 

## 2022-05-07 NOTE — Assessment & Plan Note (Signed)
She has multifactorial anemia, likely due to mild vaginal bleeding and chronic kidney disease Observe closely

## 2022-05-08 ENCOUNTER — Other Ambulatory Visit (HOSPITAL_COMMUNITY): Payer: Self-pay

## 2022-05-08 LAB — T4: T4, Total: 9.5 ug/dL (ref 4.5–12.0)

## 2022-05-15 ENCOUNTER — Other Ambulatory Visit (HOSPITAL_COMMUNITY): Payer: Self-pay

## 2022-05-21 ENCOUNTER — Other Ambulatory Visit (HOSPITAL_COMMUNITY): Payer: Self-pay

## 2022-05-21 MED ORDER — OFLOXACIN 0.3 % OP SOLN
OPHTHALMIC | 1 refills | Status: DC
  Filled 2022-05-21: qty 5, 20d supply, fill #0
  Filled 2022-05-22: qty 5, 20d supply, fill #1

## 2022-05-21 MED ORDER — KETOROLAC TROMETHAMINE 0.5 % OP SOLN
OPHTHALMIC | 1 refills | Status: DC
  Filled 2022-05-21: qty 5, 20d supply, fill #0
  Filled 2022-05-22: qty 5, 20d supply, fill #1

## 2022-05-21 MED ORDER — PREDNISOLONE ACETATE 1 % OP SUSP
OPHTHALMIC | 1 refills | Status: DC
  Filled 2022-05-21: qty 10, 30d supply, fill #0

## 2022-05-22 ENCOUNTER — Other Ambulatory Visit (HOSPITAL_COMMUNITY): Payer: Self-pay

## 2022-05-22 ENCOUNTER — Encounter: Payer: Self-pay | Admitting: Hematology and Oncology

## 2022-05-27 ENCOUNTER — Ambulatory Visit (HOSPITAL_COMMUNITY): Payer: Medicaid Other

## 2022-05-28 ENCOUNTER — Other Ambulatory Visit (HOSPITAL_COMMUNITY): Payer: Self-pay

## 2022-05-28 ENCOUNTER — Encounter: Payer: Self-pay | Admitting: Hematology and Oncology

## 2022-05-28 ENCOUNTER — Inpatient Hospital Stay (HOSPITAL_BASED_OUTPATIENT_CLINIC_OR_DEPARTMENT_OTHER): Payer: Medicaid Other | Admitting: Hematology and Oncology

## 2022-05-28 ENCOUNTER — Inpatient Hospital Stay: Payer: Medicaid Other | Attending: Gynecologic Oncology

## 2022-05-28 ENCOUNTER — Other Ambulatory Visit: Payer: Self-pay

## 2022-05-28 ENCOUNTER — Inpatient Hospital Stay: Payer: Medicaid Other

## 2022-05-28 VITALS — BP 136/81 | HR 70 | Temp 97.7°F | Resp 16

## 2022-05-28 DIAGNOSIS — Z7982 Long term (current) use of aspirin: Secondary | ICD-10-CM | POA: Diagnosis not present

## 2022-05-28 DIAGNOSIS — D6481 Anemia due to antineoplastic chemotherapy: Secondary | ICD-10-CM

## 2022-05-28 DIAGNOSIS — Z9079 Acquired absence of other genital organ(s): Secondary | ICD-10-CM | POA: Diagnosis not present

## 2022-05-28 DIAGNOSIS — Z90710 Acquired absence of both cervix and uterus: Secondary | ICD-10-CM | POA: Diagnosis not present

## 2022-05-28 DIAGNOSIS — C541 Malignant neoplasm of endometrium: Secondary | ICD-10-CM | POA: Diagnosis not present

## 2022-05-28 DIAGNOSIS — I10 Essential (primary) hypertension: Secondary | ICD-10-CM | POA: Insufficient documentation

## 2022-05-28 DIAGNOSIS — T451X5A Adverse effect of antineoplastic and immunosuppressive drugs, initial encounter: Secondary | ICD-10-CM | POA: Diagnosis not present

## 2022-05-28 DIAGNOSIS — Z7984 Long term (current) use of oral hypoglycemic drugs: Secondary | ICD-10-CM | POA: Diagnosis not present

## 2022-05-28 DIAGNOSIS — Z90722 Acquired absence of ovaries, bilateral: Secondary | ICD-10-CM | POA: Insufficient documentation

## 2022-05-28 DIAGNOSIS — Z5112 Encounter for antineoplastic immunotherapy: Secondary | ICD-10-CM | POA: Insufficient documentation

## 2022-05-28 DIAGNOSIS — Z7985 Long-term (current) use of injectable non-insulin antidiabetic drugs: Secondary | ICD-10-CM | POA: Diagnosis not present

## 2022-05-28 DIAGNOSIS — Z79899 Other long term (current) drug therapy: Secondary | ICD-10-CM | POA: Diagnosis not present

## 2022-05-28 LAB — CBC WITH DIFFERENTIAL (CANCER CENTER ONLY)
Abs Immature Granulocytes: 0.01 10*3/uL (ref 0.00–0.07)
Basophils Absolute: 0 10*3/uL (ref 0.0–0.1)
Basophils Relative: 0 %
Eosinophils Absolute: 0.1 10*3/uL (ref 0.0–0.5)
Eosinophils Relative: 1 %
HCT: 31.9 % — ABNORMAL LOW (ref 36.0–46.0)
Hemoglobin: 10.7 g/dL — ABNORMAL LOW (ref 12.0–15.0)
Immature Granulocytes: 0 %
Lymphocytes Relative: 32 %
Lymphs Abs: 1.7 10*3/uL (ref 0.7–4.0)
MCH: 30.1 pg (ref 26.0–34.0)
MCHC: 33.5 g/dL (ref 30.0–36.0)
MCV: 89.9 fL (ref 80.0–100.0)
Monocytes Absolute: 0.6 10*3/uL (ref 0.1–1.0)
Monocytes Relative: 11 %
Neutro Abs: 3 10*3/uL (ref 1.7–7.7)
Neutrophils Relative %: 56 %
Platelet Count: 281 10*3/uL (ref 150–400)
RBC: 3.55 MIL/uL — ABNORMAL LOW (ref 3.87–5.11)
RDW: 15.2 % (ref 11.5–15.5)
WBC Count: 5.3 10*3/uL (ref 4.0–10.5)
nRBC: 0 % (ref 0.0–0.2)

## 2022-05-28 LAB — CMP (CANCER CENTER ONLY)
ALT: 25 U/L (ref 0–44)
AST: 21 U/L (ref 15–41)
Albumin: 3.6 g/dL (ref 3.5–5.0)
Alkaline Phosphatase: 81 U/L (ref 38–126)
Anion gap: 8 (ref 5–15)
BUN: 17 mg/dL (ref 8–23)
CO2: 27 mmol/L (ref 22–32)
Calcium: 9.9 mg/dL (ref 8.9–10.3)
Chloride: 104 mmol/L (ref 98–111)
Creatinine: 0.93 mg/dL (ref 0.44–1.00)
GFR, Estimated: >60 mL/min (ref >60)
Glucose, Bld: 119 mg/dL — ABNORMAL HIGH (ref 70–99)
Potassium: 4 mmol/L (ref 3.5–5.1)
Sodium: 139 mmol/L (ref 135–145)
Total Bilirubin: 0.4 mg/dL (ref 0.3–1.2)
Total Protein: 7.6 g/dL (ref 6.5–8.1)

## 2022-05-28 LAB — TSH: TSH: 3.772 u[IU]/mL (ref 0.350–4.500)

## 2022-05-28 MED ORDER — SODIUM CHLORIDE 0.9% FLUSH
10.0000 mL | Freq: Once | INTRAVENOUS | Status: AC
  Administered 2022-05-28: 10 mL

## 2022-05-28 MED ORDER — SODIUM CHLORIDE 0.9 % IV SOLN: Freq: Once | INTRAVENOUS | Status: AC

## 2022-05-28 MED ORDER — HEPARIN SOD (PORK) LOCK FLUSH 100 UNIT/ML IV SOLN
500.0000 [IU] | Freq: Once | INTRAVENOUS | Status: AC | PRN
  Administered 2022-05-28: 500 [IU]

## 2022-05-28 MED ORDER — SODIUM CHLORIDE 0.9% FLUSH
10.0000 mL | INTRAVENOUS | Status: DC | PRN
  Administered 2022-05-28: 10 mL

## 2022-05-28 MED ORDER — SODIUM CHLORIDE 0.9 % IV SOLN
200.0000 mg | Freq: Once | INTRAVENOUS | Status: AC
  Administered 2022-05-28: 200 mg via INTRAVENOUS
  Filled 2022-05-28: qty 200

## 2022-05-28 NOTE — Patient Instructions (Signed)
[facility] Discharge Instructions for Patients Receiving Chemotherapy  Today you received the following chemotherapy agents Beryle Flock   To help prevent nausea and vomiting after your treatment, we encourage you to take your nausea medication as directed   If you develop nausea and vomiting that is not controlled by your nausea medication, call the clinic.   BELOW ARE SYMPTOMS THAT SHOULD BE REPORTED IMMEDIATELY: *FEVER GREATER THAN 100.5 F *CHILLS WITH OR WITHOUT FEVER NAUSEA AND VOMITING THAT IS NOT CONTROLLED WITH YOUR NAUSEA MEDICATION *UNUSUAL SHORTNESS OF BREATH *UNUSUAL BRUISING OR BLEEDING TENDERNESS IN MOUTH AND THROAT WITH OR WITHOUT PRESENCE OF ULCERS *URINARY PROBLEMS *BOWEL PROBLEMS UNUSUAL RASH Items with * indicate a potential emergency and should be followed up as soon as possible.  Feel free to call the clinic you have any questions or concerns. The clinic phone number is [phone].  Pembrolizumab injection What is this medication? PEMBROLIZUMAB (pem broe liz ue mab) is a monoclonal antibody. It is used to treat certain types of cancer. This medicine may be used for other purposes; ask your health care provider or pharmacist if you have questions. COMMON BRAND NAME(S): Keytruda What should I tell my care team before I take this medication? They need to know if you have any of these conditions: autoimmune diseases like Crohn's disease, ulcerative colitis, or lupus have had or planning to have an allogeneic stem cell transplant (uses someone else's stem cells) history of organ transplant history of chest radiation nervous system problems like myasthenia gravis or Guillain-Barre syndrome an unusual or allergic reaction to pembrolizumab, other medicines, foods, dyes, or preservatives pregnant or trying to get pregnant breast-feeding How should I use this medication? This medicine is for infusion into a vein. It is given by a health care  professional in a hospital or clinic setting. A special MedGuide will be given to you before each treatment. Be sure to read this information carefully each time. Talk to your pediatrician regarding the use of this medicine in children. While this drug may be prescribed for children as young as 6 months for selected conditions, precautions do apply. Overdosage: If you think you have taken too much of this medicine contact a poison control center or emergency room at once. NOTE: This medicine is only for you. Do not share this medicine with others. What if I miss a dose? It is important not to miss your dose. Call your doctor or health care professional if you are unable to keep an appointment. What may interact with this medication? Interactions have not been studied. This list may not describe all possible interactions. Give your health care provider a list of all the medicines, herbs, non-prescription drugs, or dietary supplements you use. Also tell them if you smoke, drink alcohol, or use illegal drugs. Some items may interact with your medicine. What should I watch for while using this medication? Your condition will be monitored carefully while you are receiving this medicine. You may need blood work done while you are taking this medicine. Do not become pregnant while taking this medicine or for 4 months after stopping it. Women should inform their doctor if they wish to become pregnant or think they might be pregnant. There is a potential for serious side effects to an unborn child. Talk to your health care professional or pharmacist for more information. Do not breast-feed an infant while taking this medicine or for 4 months after the last dose. What side effects may I notice from receiving  this medication? Side effects that you should report to your doctor or health care professional as soon as possible: allergic reactions like skin rash, itching or hives, swelling of the face, lips, or  tongue bloody or black, tarry breathing problems changes in vision chest pain chills confusion constipation cough diarrhea dizziness or feeling faint or lightheaded fast or irregular heartbeat fever flushing joint pain low blood counts - this medicine may decrease the number of white blood cells, red blood cells and platelets. You may be at increased risk for infections and bleeding. muscle pain muscle weakness pain, tingling, numbness in the hands or feet persistent headache redness, blistering, peeling or loosening of the skin, including inside the mouth signs and symptoms of high blood sugar such as dizziness; dry mouth; dry skin; fruity breath; nausea; stomach pain; increased hunger or thirst; increased urination signs and symptoms of kidney injury like trouble passing urine or change in the amount of urine signs and symptoms of liver injury like dark urine, light-colored stools, loss of appetite, nausea, right upper belly pain, yellowing of the eyes or skin sweating swollen lymph nodes weight loss Side effects that usually do not require medical attention (report to your doctor or health care professional if they continue or are bothersome): decreased appetite hair loss tiredness This list may not describe all possible side effects. Call your doctor for medical advice about side effects. You may report side effects to FDA at 1-800-FDA-1088. Where should I keep my medication? This drug is given in a hospital or clinic and will not be stored at home. NOTE: This sheet is a summary. It may not cover all possible information. If you have questions about this medicine, talk to your doctor, pharmacist, or health care provider.  2023 [employer] (2021-11-02 00:00:00)

## 2022-05-28 NOTE — Progress Notes (Signed)
[place] OFFICE PROGRESS NOTE  Patient Care Team: [name], MD as PCP - General (Family Medicine) Croitoru, Dani Gobble, MD as PCP - Cardiology (Cardiology)  ASSESSMENT & PLAN:  Papillary serous adenocarcinoma of endometrium [facility]) So far, she tolerated treatment very well I recommend minimum 4 cycles of treatment before repeating imaging study According to the patient, her documented blood pressure at home is satisfactory We will proceed with treatment without delay  Anemia due to antineoplastic chemotherapy She has multifactorial anemia, component of anemia chronic illness and from prior treatment Observe closely  Hypertension Her blood pressure is elevated today but according to the patient, it was normal at home We will proceed with treatment without delay  No orders of the defined types were placed in this encounter.   All questions were answered. The patient knows to call the clinic with any problems, questions or concerns. The total time spent in the appointment was 20 minutes encounter with patients including review of chart and various tests results, discussions about plan of care and coordination of care plan   Heath Lark, MD 05/28/2022 3:39 PM  INTERVAL HISTORY: Please see below for problem oriented charting. she returns for treatment follow-up seen prior to third cycle of treatment Her daughter is not present She felt well but she is somewhat tearful today She could not state exactly why she is tearful She continues to have chronic intermittent abdominal pain but it does not bother her Denies recent constipation or nausea She did not bring her documented blood pressure from home according to the patient, it has been within normal range  REVIEW OF SYSTEMS:   Constitutional: Denies fevers, chills or abnormal weight loss Eyes: Denies blurriness of vision Ears, nose, mouth, throat, and face: Denies mucositis or sore throat Respiratory: Denies  cough, dyspnea or wheezes Cardiovascular: Denies palpitation, chest discomfort or lower extremity swelling Gastrointestinal:  Denies nausea, heartburn or change in bowel habits Skin: Denies abnormal skin rashes Lymphatics: Denies new lymphadenopathy or easy bruising Neurological:Denies numbness, tingling or new weaknesses Behavioral/Psych: Mood is stable, no new changes  All other systems were reviewed with the patient and are negative.  I have reviewed the past medical history, past surgical history, social history and family history with the patient and they are unchanged from previous note.  ALLERGIES:  has No Known Allergies.  MEDICATIONS:  Current Outpatient Medications  Medication Sig Dispense Refill   amLODipine (NORVASC) 10 MG tablet Take 1 tablet (10 mg total) by mouth daily. 30 tablet 1   aspirin 81 MG EC tablet Take 1 tablet (81 mg total) by mouth daily. 30 tablet 12   atorvastatin (LIPITOR) 80 MG tablet TAKE 1 TABLET (80 MG TOTAL) BY MOUTH AT BEDTIME. 30 tablet 6   cholestyramine light (PREVALITE) 4 g packet Dissolve in water as directed then take 1 packet by mouth daily at 12 noon. 30 each 0   Dulaglutide (TRULICITY) 1.5 WU/9.8JX SOPN Inject 1.5 mg into the skin once a week. 2 mL 6   furosemide (LASIX) 20 MG tablet Take 1 tablet (20 mg total) by mouth every other day. 30 tablet 3   glimepiride (AMARYL) 4 MG tablet Take 1 tablet (4 mg total) by mouth daily with breakfast. 30 tablet 6   isosorbide mononitrate (IMDUR) 30 MG 24 hr tablet Take 2 tablets (60 mg total) by mouth daily. 60 tablet 6   ketorolac (ACULAR) 0.5 % ophthalmic solution Instill 1 drop into left eye 4 times a day STARTING 1  DAY BEFORE SURGERY 5 mL 1   lenvatinib 10 mg daily dose (LENVIMA, 10 MG DAILY DOSE,) capsule Take 1 capsule (10 mg total) by mouth daily. (Patient not taking: Reported on 04/12/2022) 30 capsule 11   lisinopril (ZESTRIL) 20 MG tablet TAKE 1 TABLET BY MOUTH ONCE A DAY 30 tablet 6   metFORMIN  (GLUCOPHAGE) 500 MG tablet Take 2 tablets (1,000 mg total) by mouth 2 (two) times daily with a meal. 120 tablet 6   metoprolol tartrate (LOPRESSOR) 100 MG tablet TAKE 1 TABLET (100 MG TOTAL) BY MOUTH 2 (TWO) TIMES DAILY. 60 tablet 6   nitroGLYCERIN (NITROSTAT) 0.4 MG SL tablet Place 1 tablet (0.4 mg total) under the tongue every 5 (five) minutes as needed for chest pain. 25 tablet 2   ofloxacin (OCUFLOX) 0.3 % ophthalmic solution Instill 1 drop into left eye 4 times a day STARTING 1 DAY BEFORE SURGERY 5 mL 1   prednisoLONE acetate (PRED FORTE) 1 % ophthalmic suspension Instill 1 drop into left eye 4 times a day STARTING AFTER SURGERY 10 mL 1   No current facility-administered medications for this visit.   Facility-Administered Medications Ordered in Other Visits  Medication Dose Route Frequency Provider Last Rate Last Admin   sodium chloride flush (NS) 0.9 % injection 10 mL  10 mL Intracatheter PRN Alvy Bimler, Khyrie Masi, MD   10 mL at 05/28/22 1334    SUMMARY OF ONCOLOGIC HISTORY: Oncology History Overview Note  Pap 12/14/20: adenocarcinoma, HPV negative  HER-2 positive by FISH Her-2 equivocal by [facility]  [facility] stable    Papillary serous adenocarcinoma of endometrium ([place])   Initial Diagnosis   Endometrial carcinoma ([place])   01/12/2021 Initial Biopsy   A. ENDOCERVIX, CURETTAGE:  - Adenocarcinoma.  B. ENDOMETRIUM, BIOPSY:  - Adenocarcinoma.  COMMENT:  The differential diagnosis includes high grade serous carcinoma.  Both  specimens have a similar morphology; high grade serous carcinoma more  commonly arises in the endometrium.  Results reported to Dr. Hale Bogus  on 01/15/2021.  Dr. Saralyn Pilar reviewed the case.    01/16/2021 Tumor Marker   Patient's tumor was tested for the following markers: CA-125 Results of the tumor marker test revealed normal value, 10.7   01/25/2021 Imaging   CT C/A/P: 1. Small volume fluid in the endometrial canal with 7 mm hypoattenuating lesion in the myometrium of the  anterior fundus. 2. No definite evidence for metastatic disease in the chest, abdomen, or pelvis. Small lymph nodes are noted along both pelvic sidewalls. Attention on follow-up recommended. 3. 5 mm ground-glass opacity peripheral left upper lobe. This is likely related to infection/inflammation and potentially scar. Attention on surveillance imaging recommended. 4. 5.8 cm lesion posterior lower uterine segment likely a fibroid. Ultrasound exam from 01/28/2010 demonstrated a 5.2 cm lesion in the left aspect of the posterior lower uterine body. 5. Aortic Atherosclerosis (ICD10-I70.0).   01/29/2021 Surgery   TRH/BSO, SLN biopsy left, selective pelvic LND right, omentectomy  Findings: On EUA, 10-12cm enlarged mobile uterus. On intra-abdomina entry, minimal adhesions between the liver and anterior abdomen on the right. Some changes c/w fatty liver on the left. Omentum, stomach, small and large bowel all grossly normal. Bilateral ovaries normal appearing. Uterus with 5-6cm posterior fibroid, otherwise normal appearing. No mapping to right pelvis. On left, mapping to the level of the superior vessel artery, enlarged lymph node noted (likely sentinel) within the upper aspect of the obturator space. In bilateral pelvic basins, multiple enlarged lymph nodes. On the right, enlarged lymph nodes  extended superiorly along the common iliac vessels. At the end of surgery, no obvious abdominal or pelvic evidence of disease.   01/29/2021 Pathology Results   A. SENTINEL LYMPH NODE, LEFT INTERNAL ILIAC, BIOPSY:  - Metastatic carcinoma in (1) of (1) lymph node.   B. SENTINEL LYMPH NODE, LEFT OBTURATOR, BIOPSY:  - Metastatic carcinoma in (1) of (1) lymph node.   C. LYMPH NODES, LEFT PELVIC, DISSECTION:  - Metastatic carcinoma in (1) of (3) lymph nodes.   D. LYMPH NODE, RIGHT EXTERNAL AND COMMON ILIAC, BIOPSY:  - Metastatic carcinoma in (1) of (1) lymph node.   E. UTERUS, CERVIX AND BILATERAL FALLOPIAN TUBES  AND OVARIES, TOTAL  HYSTERECTOMY AND BILATERAL SALPINGO-OOPHORECTOMY:  - High grade serous carcinoma of endometrium, with invasion more than  half of the myometrium.  - Tumor invades the stromal connective tissue of the cervix.  - No involvement of uterine serosa or adnexa.  - Lymphovascular invasion is identified.  - See oncology table.   F. OMENTUM, OMENTECTOMY:  - Omentum, negative for carcinoma.   G. LYMPH NODES, RIGHT PELVIC, DISSECTION:  - Two lymph nodes, negative for carcinoma (0/2).   ONCOLOGY TABLE:   UTERUS, CARCINOMA OR CARCINOSARCOMA: Resection   Procedure: Total hysterectomy and bilateral salpingo-oophorectomy,  Omentectomy, Lymph node sampling  Histologic Type: Serous carcinoma  Histologic Grade: High grade  Myometrial Invasion: > 50%  Uterine Serosa Involvement: Not identified  Cervical Stroma Involvement: Present  Other Tissue/Organ Involvement: Not identified  Peritoneal/Ascitic Fluid: Negative for carcinoma  Lymphovascular Invasion: Present  Regional Lymph Nodes:       Pelvic Lymph Nodes Examined:            2 Sentinel            6 Non-Sentinel            8 Total       Pelvic Lymph Nodes with Metastasis: 4            Macrometastasis: 3            Micrometastasis: 1            Isolated Tumor Cells: 0            Laterality of Lymph Nodes with Tumor: Right (non-sentinel),  Left (sentinel and non-sentinel)            Extracapsular Extension: Present       Para-Aortic Lymph Nodes Examined: Not applicable        Para-Aortic Lymph Nodes with Metastasis: Not applicable  Distant Metastasis:       Distant Site(s) Involved: Omentum: Not involved  Pathologic Stage Classification (pTNM, AJCC 8th Edition): pT2, pN1a  Ancillary Studies: HER2 will be ordered  Additional Findings: Leiomyomata.  Adenomyosis.  Representative Tumor Block: B1  Comment: Dr. Saralyn Pilar reviewed select slides.  (v4.2.0.1)    01/29/2021 Cancer Staging   Staging form: Corpus Uteri -  Carcinoma and Carcinosarcoma, AJCC 8th Edition - Clinical stage from 01/29/2021: FIGO Stage IIIC1 (cT1b, cN1a, cM0) - Signed by Lafonda Mosses, MD on 02/02/2021 Histopathologic type: Mixed cell adenocarcinoma Stage prefix: Initial diagnosis Method of lymph node assessment: Other Histologic grade (G): G3 Histologic grading system: 3 grade system Lymph-vascular invasion (LVI): LVI present/identified, NOS Peritoneal cytology results: Negative Pelvic nodal status: Positive Number of pelvic nodes positive from dissection: 4 Number of pelvic nodes examined during dissection: 8 Para-aortic status: Not assessed Lymph node metastasis: Present Omentectomy performed: Yes Morcellation performed: No   02/19/2021 Echocardiogram  1. Left ventricular ejection fraction, by estimation, is 55 to 60%. The left ventricle has normal function. The left ventricle demonstrates regional wall motion abnormalities (see scoring diagram/findings for description). There is mild left ventricular  hypertrophy. Left ventricular diastolic parameters are consistent with Grade I diastolic dysfunction (impaired relaxation). There is moderate hypokinesis of the left ventricular, basal septal wall and inferior wall. The average left ventricular global longitudinal strain is -17.9 %. The global longitudinal strain is abnormal.  2. Right ventricular systolic function is normal. The right ventricular size is normal. There is normal pulmonary artery systolic pressure. The estimated right ventricular systolic pressure is 81.1 mmHg.  3. The mitral valve is abnormal. Trivial mitral valve regurgitation.  4. The aortic valve is tricuspid. Aortic valve regurgitation is not visualized.  5. The inferior vena cava is normal in size with greater than 50% respiratory variability, suggesting right atrial pressure of 3 mmHg.   02/20/2021 Procedure   Successful placement of a right IJ approach Power Port with ultrasound and fluoroscopic guidance.  The catheter is ready for use.   02/28/2021 - 06/14/2021 Chemotherapy    Patient is on Treatment Plan: UTERINE CARBOPLATIN AUC 6 / PACLITAXEL Q21D       04/20/2021 Imaging   Status post hysterectomy and suspected bilateral salpingo-oophorectomy.   2.2 cm fluid density lesion along the left pelvic sidewall likely reflects a postoperative seroma.   No findings suspicious for recurrent or metastatic disease.   07/16/2021 - 08/16/2021 Radiation Therapy   Radiation Treatment Dates: 07/16/2021 through 08/16/2021 Site Technique Total Dose (Gy) Dose per Fx (Gy) Completed Fx Beam Energies  Vagina: Pelvis HDR-brachy 30/30 6 5/5 Ir-192        10/01/2021 Imaging   Increasing size of LEFT pelvic lymph nodes, largest approximately 11 mm along the external iliac chain. Given findings and history could consider PET for further evaluation as warranted.   Cystic area along the LEFT pelvic sidewall that was seen previously has improved.   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   Aortic Atherosclerosis (ICD10-I70.0).   12/21/2021 Imaging   1. Enlarging soft tissue masses involving the vaginal cuff. 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 Speculum examination and re-biopsy may be confirmatory. PET-CT may be helpful. 2. New small sub 5 mm perirectal and sigmoid mesocolon nodes. 3. Slight progression of pelvic adenopathy as detailed above. 4. No findings suspicious for abdominal metastatic disease or osseous metastatic disease. 5. Stable age advanced atherosclerotic calcifications involving the aorta and branch vessels and coronary arteries   12/25/2021 Echocardiogram    1. Global longitudinal strain is -16.1% Mild basal inferior hypoinesis. Compared to echo from 02/2021, no change in overall LVEF /regional wall motion. Global longitudinal strain is mildly less negative (-17.9% to -16.1%.  Left ventricular ejection fraction, by estimation, is 65 to 70%. The left ventricle has normal function. The left ventricle has no regional wall motion abnormalities. There is mild left ventricular hypertrophy. Left ventricular diastolic parameters are indeterminate.  2. Right ventricular systolic function is normal. The right ventricular size is normal. There is normal pulmonary artery systolic pressure.  3. Trivial mitral valve regurgitation.  4. The aortic valve is normal in structure. Aortic valve regurgitation is not visualized.  5. The inferior vena cava is normal in size with greater than 50% respiratory variability, suggesting right atrial pressure of 3 mmHg.     01/01/2022 - 01/22/2022 Chemotherapy  Patient is on Treatment Plan : UTERINE SEROUS CARCINOMA Carboplatin + Paclitaxel + Trastuzumab q21d x 6 Cycles / Trastuzumab q21d     03/29/2022 Pathology Results   A.   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   COMMENT:   The carcinoma has diffuse strong Pax8 immunoreactivity (Mullerian marker) and abnormal overexpression of nuclear p53 and relatively diffuse strong p16 staining.  Estrogen receptor (ER) is weakly reactive  and there is focal strong Napsin A immunoreactivity.  The immunophenotype is most characteristic of serous carcinoma.   The history of prior treatment for endometrial serous carcinoma is noted.   04/12/2022 Imaging   1. Surgical change of prior hysterectomy with increased nodular thickening at the vaginal cuff, suspicious for recurrent disease. Consider further evaluation with PET-CT versus direct tissue sampling. 2. Unchanged prominent/mildly enlarged retroperitoneal and pelvic lymph nodes, nonspecific but in the setting of disease recurrence these would be at least somewhat suspicious for nodal involvement. Consider attention on follow-up  imaging. 3. No evidence of metastatic disease within the chest. 4. Left-sided colonic diverticulosis without findings of acute diverticulitis. 5.  Aortic Atherosclerosis (ICD10-I70.0).   04/16/2022 -  Chemotherapy   Patient is on Treatment Plan : UTERINE Lenvatinib (20) D1-21 + Pembrolizumab (200) D1 q21d       PHYSICAL EXAMINATION: ECOG PERFORMANCE STATUS: 1 - Symptomatic but completely ambulatory  Vitals:   05/28/22 1139  BP: (!) 151/80  Pulse: 70  Resp: 16  Temp: 97.7 F (36.5 C)  SpO2: 100%   Filed Weights   05/28/22 1139  Weight: 208 lb 6.4 oz (94.5 kg)    GENERAL:alert, no distress and comfortable NEURO: alert & oriented x 3 with fluent speech, no focal motor/sensory deficits  LABORATORY DATA:  I have reviewed the data as listed    Component Value Date/Time   NA 139 05/28/2022 1113   NA 143 10/18/2020 1028   K 4.0 05/28/2022 1113   CL 104 05/28/2022 1113   CO2 27 05/28/2022 1113   GLUCOSE 119 (H) 05/28/2022 1113   BUN 17 05/28/2022 1113   BUN 13 10/18/2020 1028   CREATININE 0.93 05/28/2022 1113   CREATININE 0.79 02/11/2017 0943   CALCIUM 9.9 05/28/2022 1113   PROT 7.6 05/28/2022 1113   PROT 6.7 10/18/2020 1028   ALBUMIN 3.6 05/28/2022 1113   ALBUMIN 4.1 10/18/2020 1028   AST 21 05/28/2022 1113   ALT 25 05/28/2022 1113   ALKPHOS 81 05/28/2022 1113   BILITOT 0.4 05/28/2022 1113   GFRNONAA >60 05/28/2022 1113   GFRNONAA 83 02/11/2017 0943   GFRAA 70 10/18/2020 1028   GFRAA >89 02/11/2017 0943    No results found for: "SPEP", "UPEP"  Lab Results  Component Value Date   WBC 5.3 05/28/2022   NEUTROABS 3.0 05/28/2022   HGB 10.7 (L) 05/28/2022   HCT 31.9 (L) 05/28/2022   MCV 89.9 05/28/2022   PLT 281 05/28/2022      Chemistry      Component Value Date/Time   NA 139 05/28/2022 1113   NA 143 10/18/2020 1028   K 4.0 05/28/2022 1113   CL 104 05/28/2022 1113   CO2 27 05/28/2022 1113   BUN 17 05/28/2022 1113   BUN 13 10/18/2020 1028   CREATININE  0.93 05/28/2022 1113   CREATININE 0.79 02/11/2017 0943      Component Value Date/Time   CALCIUM 9.9 05/28/2022 1113  ALKPHOS 81 05/28/2022 1113   AST 21 05/28/2022 1113   ALT 25 05/28/2022 1113   BILITOT 0.4 05/28/2022 1113

## 2022-05-28 NOTE — Assessment & Plan Note (Signed)
Her blood pressure is elevated today but according to the patient, it was normal at home We will proceed with treatment without delay

## 2022-05-28 NOTE — Assessment & Plan Note (Signed)
So far, she tolerated treatment very well I recommend minimum 4 cycles of treatment before repeating imaging study According to the patient, her documented blood pressure at home is satisfactory We will proceed with treatment without delay

## 2022-05-28 NOTE — Assessment & Plan Note (Signed)
She has multifactorial anemia, component of anemia chronic illness and from prior treatment Observe closely 

## 2022-05-29 ENCOUNTER — Other Ambulatory Visit (HOSPITAL_COMMUNITY): Payer: Self-pay

## 2022-05-29 LAB — T4: T4, Total: 9.6 ug/dL (ref 4.5–12.0)

## 2022-06-05 ENCOUNTER — Other Ambulatory Visit (HOSPITAL_COMMUNITY): Payer: Self-pay

## 2022-06-12 ENCOUNTER — Other Ambulatory Visit (HOSPITAL_COMMUNITY): Payer: Self-pay

## 2022-06-13 ENCOUNTER — Other Ambulatory Visit (HOSPITAL_COMMUNITY): Payer: Self-pay

## 2022-06-13 ENCOUNTER — Ambulatory Visit: Payer: Medicaid Other | Admitting: Cardiovascular Disease

## 2022-06-14 ENCOUNTER — Other Ambulatory Visit (HOSPITAL_COMMUNITY): Payer: Self-pay

## 2022-06-17 ENCOUNTER — Ambulatory Visit (HOSPITAL_COMMUNITY): Payer: Medicaid Other

## 2022-06-19 ENCOUNTER — Ambulatory Visit: Payer: Medicaid Other | Admitting: Adult Health

## 2022-06-20 ENCOUNTER — Encounter: Payer: Self-pay | Admitting: Hematology and Oncology

## 2022-06-20 ENCOUNTER — Inpatient Hospital Stay: Payer: Medicaid Other | Attending: Gynecologic Oncology

## 2022-06-20 ENCOUNTER — Other Ambulatory Visit: Payer: Self-pay

## 2022-06-20 ENCOUNTER — Inpatient Hospital Stay: Payer: Medicaid Other

## 2022-06-20 ENCOUNTER — Inpatient Hospital Stay (HOSPITAL_BASED_OUTPATIENT_CLINIC_OR_DEPARTMENT_OTHER): Payer: Medicaid Other | Admitting: Hematology and Oncology

## 2022-06-20 DIAGNOSIS — D6481 Anemia due to antineoplastic chemotherapy: Secondary | ICD-10-CM

## 2022-06-20 DIAGNOSIS — N1831 Chronic kidney disease, stage 3a: Secondary | ICD-10-CM

## 2022-06-20 DIAGNOSIS — E1169 Type 2 diabetes mellitus with other specified complication: Secondary | ICD-10-CM | POA: Diagnosis not present

## 2022-06-20 DIAGNOSIS — Z7982 Long term (current) use of aspirin: Secondary | ICD-10-CM | POA: Insufficient documentation

## 2022-06-20 DIAGNOSIS — Z79899 Other long term (current) drug therapy: Secondary | ICD-10-CM | POA: Insufficient documentation

## 2022-06-20 DIAGNOSIS — C541 Malignant neoplasm of endometrium: Secondary | ICD-10-CM | POA: Insufficient documentation

## 2022-06-20 DIAGNOSIS — Z7984 Long term (current) use of oral hypoglycemic drugs: Secondary | ICD-10-CM | POA: Insufficient documentation

## 2022-06-20 DIAGNOSIS — E669 Obesity, unspecified: Secondary | ICD-10-CM

## 2022-06-20 DIAGNOSIS — D638 Anemia in other chronic diseases classified elsewhere: Secondary | ICD-10-CM | POA: Insufficient documentation

## 2022-06-20 DIAGNOSIS — Z7985 Long-term (current) use of injectable non-insulin antidiabetic drugs: Secondary | ICD-10-CM | POA: Diagnosis not present

## 2022-06-20 DIAGNOSIS — Z5112 Encounter for antineoplastic immunotherapy: Secondary | ICD-10-CM | POA: Diagnosis not present

## 2022-06-20 DIAGNOSIS — I700 Atherosclerosis of aorta: Secondary | ICD-10-CM | POA: Diagnosis not present

## 2022-06-20 DIAGNOSIS — E1122 Type 2 diabetes mellitus with diabetic chronic kidney disease: Secondary | ICD-10-CM | POA: Insufficient documentation

## 2022-06-20 DIAGNOSIS — I130 Hypertensive heart and chronic kidney disease with heart failure and stage 1 through stage 4 chronic kidney disease, or unspecified chronic kidney disease: Secondary | ICD-10-CM | POA: Diagnosis not present

## 2022-06-20 DIAGNOSIS — T451X5A Adverse effect of antineoplastic and immunosuppressive drugs, initial encounter: Secondary | ICD-10-CM

## 2022-06-20 LAB — CBC WITH DIFFERENTIAL (CANCER CENTER ONLY)
Abs Immature Granulocytes: 0.02 10*3/uL (ref 0.00–0.07)
Basophils Absolute: 0 10*3/uL (ref 0.0–0.1)
Basophils Relative: 0 %
Eosinophils Absolute: 0 10*3/uL (ref 0.0–0.5)
Eosinophils Relative: 1 %
HCT: 33 % — ABNORMAL LOW (ref 36.0–46.0)
Hemoglobin: 11.4 g/dL — ABNORMAL LOW (ref 12.0–15.0)
Immature Granulocytes: 0 %
Lymphocytes Relative: 24 %
Lymphs Abs: 1.3 10*3/uL (ref 0.7–4.0)
MCH: 30.6 pg (ref 26.0–34.0)
MCHC: 34.5 g/dL (ref 30.0–36.0)
MCV: 88.5 fL (ref 80.0–100.0)
Monocytes Absolute: 0.5 10*3/uL (ref 0.1–1.0)
Monocytes Relative: 10 %
Neutro Abs: 3.4 10*3/uL (ref 1.7–7.7)
Neutrophils Relative %: 65 %
Platelet Count: 269 10*3/uL (ref 150–400)
RBC: 3.73 MIL/uL — ABNORMAL LOW (ref 3.87–5.11)
RDW: 14.9 % (ref 11.5–15.5)
WBC Count: 5.3 10*3/uL (ref 4.0–10.5)
nRBC: 0 % (ref 0.0–0.2)

## 2022-06-20 LAB — CMP (CANCER CENTER ONLY)
ALT: 34 U/L (ref 0–44)
AST: 25 U/L (ref 15–41)
Albumin: 3.6 g/dL (ref 3.5–5.0)
Alkaline Phosphatase: 109 U/L (ref 38–126)
Anion gap: 10 (ref 5–15)
BUN: 14 mg/dL (ref 8–23)
CO2: 24 mmol/L (ref 22–32)
Calcium: 9.2 mg/dL (ref 8.9–10.3)
Chloride: 105 mmol/L (ref 98–111)
Creatinine: 1.05 mg/dL — ABNORMAL HIGH (ref 0.44–1.00)
GFR, Estimated: 60 mL/min — ABNORMAL LOW (ref >60)
Glucose, Bld: 204 mg/dL — ABNORMAL HIGH (ref 70–99)
Potassium: 3.6 mmol/L (ref 3.5–5.1)
Sodium: 139 mmol/L (ref 135–145)
Total Bilirubin: 0.4 mg/dL (ref 0.3–1.2)
Total Protein: 7.3 g/dL (ref 6.5–8.1)

## 2022-06-20 LAB — TSH: TSH: 3.365 u[IU]/mL (ref 0.350–4.500)

## 2022-06-20 MED ORDER — SODIUM CHLORIDE 0.9% FLUSH
10.0000 mL | Freq: Once | INTRAVENOUS | Status: AC
  Administered 2022-06-20: 10 mL

## 2022-06-20 MED ORDER — HEPARIN SOD (PORK) LOCK FLUSH 100 UNIT/ML IV SOLN
500.0000 [IU] | Freq: Once | INTRAVENOUS | Status: AC | PRN
  Administered 2022-06-20: 500 [IU]

## 2022-06-20 MED ORDER — SODIUM CHLORIDE 0.9 % IV SOLN: Freq: Once | INTRAVENOUS | Status: AC

## 2022-06-20 MED ORDER — SODIUM CHLORIDE 0.9 % IV SOLN
200.0000 mg | Freq: Once | INTRAVENOUS | Status: AC
  Administered 2022-06-20: 200 mg via INTRAVENOUS
  Filled 2022-06-20: qty 200

## 2022-06-20 MED ORDER — SODIUM CHLORIDE 0.9% FLUSH
10.0000 mL | INTRAVENOUS | Status: DC | PRN
  Administered 2022-06-20: 10 mL

## 2022-06-20 NOTE — Assessment & Plan Note (Signed)
She has chronic kidney disease multifactorial related to her diabetes and uncontrolled hypertension We will monitor closely I reinforced the importance of adequate fluid hydration and blood pressure control

## 2022-06-20 NOTE — Assessment & Plan Note (Signed)
She has multifactorial anemia, component of anemia chronic illness and from prior treatment Observe closely 

## 2022-06-20 NOTE — Assessment & Plan Note (Addendum)
We discussed importance of dietary modification while on treatment I emphasized the importance of high-protein intake

## 2022-06-20 NOTE — Patient Instructions (Signed)
[facility] MEDICAL ONCOLOGY  Discharge Instructions: Thank you for choosing [facility] to provide your oncology and hematology care.   If you have a lab appointment with the Cancer Center, please go directly to the Cancer Center and check in at the registration area.   Wear comfortable clothing and clothing appropriate for easy access to any Portacath or PICC line.   We strive to give you quality time with your provider. You may need to reschedule your appointment if you arrive late (15 or more minutes).  Arriving late affects you and other patients whose appointments are after yours.  Also, if you miss three or more appointments without notifying the office, you may be dismissed from the clinic at the provider's discretion.      For prescription refill requests, have your pharmacy contact our office and allow 72 hours for refills to be completed.    Today you received the following chemotherapy and/or immunotherapy agents: Keytruda      To help prevent nausea and vomiting after your treatment, we encourage you to take your nausea medication as directed.  BELOW ARE SYMPTOMS THAT SHOULD BE REPORTED IMMEDIATELY: *FEVER GREATER THAN 100.4 F (38 C) OR HIGHER *CHILLS OR SWEATING *NAUSEA AND VOMITING THAT IS NOT CONTROLLED WITH YOUR NAUSEA MEDICATION *UNUSUAL SHORTNESS OF BREATH *UNUSUAL BRUISING OR BLEEDING *URINARY PROBLEMS (pain or burning when urinating, or frequent urination) *BOWEL PROBLEMS (unusual diarrhea, constipation, pain near the anus) TENDERNESS IN MOUTH AND THROAT WITH OR WITHOUT PRESENCE OF ULCERS (sore throat, sores in mouth, or a toothache) UNUSUAL RASH, SWELLING OR PAIN  UNUSUAL VAGINAL DISCHARGE OR ITCHING   Items with * indicate a potential emergency and should be followed up as soon as possible or go to the Emergency Department if any problems should occur.  Please show the CHEMOTHERAPY ALERT CARD or IMMUNOTHERAPY ALERT CARD at check-in to  the Emergency Department and triage nurse.  Should you have questions after your visit or need to cancel or reschedule your appointment, please contact [facility] MEDICAL ONCOLOGY  Dept: [phone]  and follow the prompts.  Office hours are 8:00 a.m. to 4:30 p.m. Monday - Friday. Please note that voicemails left after 4:00 p.m. may not be returned until the following business day.  We are closed weekends and major holidays. You have access to a nurse at all times for urgent questions. Please call the main number to the clinic Dept: [phone] and follow the prompts.   For any non-urgent questions, you may also contact your provider using MyChart. We now offer e-Visits for anyone 18 and older to request care online for non-urgent symptoms. For details visit [email or web address].   Also download the MyChart app! Go to the app store, search "MyChart", open the app, select , and log in with your MyChart username and password.  Masks are optional in the cancer centers. If you would like for your care team to wear a mask while they are taking care of you, please let them know. For doctor visits, patients may have with them one support person who is at least 64 years old. At this time, visitors are not allowed in the infusion area. 

## 2022-06-20 NOTE — Assessment & Plan Note (Signed)
So far, she tolerated treatment very well I plan to repeat imaging study in of the month for objective assessment of response to treatment According to the patient, her documented blood pressure at home is satisfactory We will proceed with treatment without delay

## 2022-06-20 NOTE — Progress Notes (Signed)
[place] OFFICE PROGRESS NOTE  Patient Care Team: [name], MD as PCP - General (Family Medicine) Croitoru, Dani Gobble, MD as PCP - Cardiology (Cardiology)  ASSESSMENT & PLAN:  Papillary serous adenocarcinoma of endometrium [facility]) So far, she tolerated treatment very well I plan to repeat imaging study in of the month for objective assessment of response to treatment According to the patient, her documented blood pressure at home is satisfactory We will proceed with treatment without delay  Anemia due to antineoplastic chemotherapy She has multifactorial anemia, component of anemia chronic illness and from prior treatment Observe closely  Chronic kidney disease, stage 3a ([place]) She has chronic kidney disease multifactorial related to her diabetes and uncontrolled hypertension We will monitor closely I reinforced the importance of adequate fluid hydration and blood pressure control  Diabetes mellitus type 2 in obese [facility]) We discussed importance of dietary modification while on treatment I emphasized the importance of high-protein intake  Orders Placed This Encounter  Procedures   CT ABDOMEN PELVIS W CONTRAST    Standing Status:   Future    Standing Expiration Date:   06/21/2023    Order Specific Question:   If indicated for the ordered procedure, I authorize the administration of contrast media per Radiology protocol    Answer:   Yes    Order Specific Question:   Preferred imaging location?    Answer:   [facility]    Order Specific Question:   Radiology Contrast Protocol - do NOT remove file path    Answer:   [email or web address]    All questions were answered. The patient knows to call the clinic with any problems, questions or concerns. The total time spent in the appointment was 30 minutes encounter with patients including review of chart and various tests results, discussions about plan of care and coordination  of care plan   Heath Lark, MD 06/20/2022 11:59 AM  INTERVAL HISTORY: Please see below for problem oriented charting. she returns for treatment follow-up with her daughter Her blood pressure at home is satisfactory According to her daughter, her diet is slightly better but she has difficulty preparing for her meals and typically would like to eat something that is easy to make and typically higher in carbs/sugar She has intermittent vaginal spotting  REVIEW OF SYSTEMS:   Constitutional: Denies fevers, chills or abnormal weight loss Eyes: Denies blurriness of vision Ears, nose, mouth, throat, and face: Denies mucositis or sore throat Respiratory: Denies cough, dyspnea or wheezes Cardiovascular: Denies palpitation, chest discomfort or lower extremity swelling Gastrointestinal:  Denies nausea, heartburn or change in bowel habits Skin: Denies abnormal skin rashes Lymphatics: Denies new lymphadenopathy or easy bruising Neurological:Denies numbness, tingling or new weaknesses Behavioral/Psych: Mood is stable, no new changes  All other systems were reviewed with the patient and are negative.  I have reviewed the past medical history, past surgical history, social history and family history with the patient and they are unchanged from previous note.  ALLERGIES:  has No Known Allergies.  MEDICATIONS:  Current Outpatient Medications  Medication Sig Dispense Refill   amLODipine (NORVASC) 10 MG tablet Take 1 tablet (10 mg total) by mouth daily. 30 tablet 1   aspirin 81 MG EC tablet Take 1 tablet (81 mg total) by mouth daily. 30 tablet 12   atorvastatin (LIPITOR) 80 MG tablet TAKE 1 TABLET (80 MG TOTAL) BY MOUTH AT BEDTIME. 30 tablet 6   cholestyramine light (PREVALITE) 4 g packet Dissolve in water as directed  then take 1 packet by mouth daily at 12 noon. 30 each 0   Dulaglutide (TRULICITY) 1.5 GN/5.6OZ SOPN Inject 1.5 mg into the skin once a week. 2 mL 6   furosemide (LASIX) 20 MG tablet Take 1  tablet (20 mg total) by mouth every other day. 30 tablet 3   glimepiride (AMARYL) 4 MG tablet Take 1 tablet (4 mg total) by mouth daily with breakfast. 30 tablet 6   isosorbide mononitrate (IMDUR) 30 MG 24 hr tablet Take 2 tablets (60 mg total) by mouth daily. 60 tablet 6   ketorolac (ACULAR) 0.5 % ophthalmic solution Instill 1 drop into left eye 4 times a day STARTING 1 DAY BEFORE SURGERY 5 mL 1   lenvatinib 10 mg daily dose (LENVIMA, 10 MG DAILY DOSE,) capsule Take 1 capsule (10 mg total) by mouth daily. (Patient not taking: Reported on 04/12/2022) 30 capsule 11   lisinopril (ZESTRIL) 20 MG tablet TAKE 1 TABLET BY MOUTH ONCE A DAY 30 tablet 6   metFORMIN (GLUCOPHAGE) 500 MG tablet Take 2 tablets (1,000 mg total) by mouth 2 (two) times daily with a meal. 120 tablet 6   metoprolol tartrate (LOPRESSOR) 100 MG tablet TAKE 1 TABLET (100 MG TOTAL) BY MOUTH 2 (TWO) TIMES DAILY. 60 tablet 6   nitroGLYCERIN (NITROSTAT) 0.4 MG SL tablet Place 1 tablet (0.4 mg total) under the tongue every 5 (five) minutes as needed for chest pain. 25 tablet 2   ofloxacin (OCUFLOX) 0.3 % ophthalmic solution Instill 1 drop into left eye 4 times a day STARTING 1 DAY BEFORE SURGERY 5 mL 1   prednisoLONE acetate (PRED FORTE) 1 % ophthalmic suspension Instill 1 drop into left eye 4 times a day STARTING AFTER SURGERY 10 mL 1   No current facility-administered medications for this visit.    SUMMARY OF ONCOLOGIC HISTORY: Oncology History Overview Note  Pap 12/14/20: adenocarcinoma, HPV negative  HER-2 positive by FISH Her-2 equivocal by [facility]  MSI stable    Papillary serous adenocarcinoma of endometrium ([place])   Initial Diagnosis   Endometrial carcinoma ([place])   01/12/2021 Initial Biopsy   A. ENDOCERVIX, CURETTAGE:  - Adenocarcinoma.  B. ENDOMETRIUM, BIOPSY:  - Adenocarcinoma.  COMMENT:  The differential diagnosis includes high grade serous carcinoma.  Both  specimens have a similar morphology; high grade serous  carcinoma more  commonly arises in the endometrium.  Results reported to Dr. Hale Bogus  on 01/15/2021.  Dr. Saralyn Pilar reviewed the case.    01/16/2021 Tumor Marker   Patient's tumor was tested for the following markers: CA-125 Results of the tumor marker test revealed normal value, 10.7   01/25/2021 Imaging   CT C/A/P: 1. Small volume fluid in the endometrial canal with 7 mm hypoattenuating lesion in the myometrium of the anterior fundus. 2. No definite evidence for metastatic disease in the chest, abdomen, or pelvis. Small lymph nodes are noted along both pelvic sidewalls. Attention on follow-up recommended. 3. 5 mm ground-glass opacity peripheral left upper lobe. This is likely related to infection/inflammation and potentially scar. Attention on surveillance imaging recommended. 4. 5.8 cm lesion posterior lower uterine segment likely a fibroid. Ultrasound exam from 01/28/2010 demonstrated a 5.2 cm lesion in the left aspect of the posterior lower uterine body. 5. Aortic Atherosclerosis (ICD10-I70.0).   01/29/2021 Surgery   TRH/BSO, SLN biopsy left, selective pelvic LND right, omentectomy  Findings: On EUA, 10-12cm enlarged mobile uterus. On intra-abdomina entry, minimal adhesions between the liver and anterior abdomen on the right. Some  changes c/w fatty liver on the left. Omentum, stomach, small and large bowel all grossly normal. Bilateral ovaries normal appearing. Uterus with 5-6cm posterior fibroid, otherwise normal appearing. No mapping to right pelvis. On left, mapping to the level of the superior vessel artery, enlarged lymph node noted (likely sentinel) within the upper aspect of the obturator space. In bilateral pelvic basins, multiple enlarged lymph nodes. On the right, enlarged lymph nodes extended superiorly along the common iliac vessels. At the end of surgery, no obvious abdominal or pelvic evidence of disease.   01/29/2021 Pathology Results   A. SENTINEL LYMPH NODE, LEFT  INTERNAL ILIAC, BIOPSY:  - Metastatic carcinoma in (1) of (1) lymph node.   B. SENTINEL LYMPH NODE, LEFT OBTURATOR, BIOPSY:  - Metastatic carcinoma in (1) of (1) lymph node.   C. LYMPH NODES, LEFT PELVIC, DISSECTION:  - Metastatic carcinoma in (1) of (3) lymph nodes.   D. LYMPH NODE, RIGHT EXTERNAL AND COMMON ILIAC, BIOPSY:  - Metastatic carcinoma in (1) of (1) lymph node.   E. UTERUS, CERVIX AND BILATERAL FALLOPIAN TUBES AND OVARIES, TOTAL  HYSTERECTOMY AND BILATERAL SALPINGO-OOPHORECTOMY:  - High grade serous carcinoma of endometrium, with invasion more than  half of the myometrium.  - Tumor invades the stromal connective tissue of the cervix.  - No involvement of uterine serosa or adnexa.  - Lymphovascular invasion is identified.  - See oncology table.   F. OMENTUM, OMENTECTOMY:  - Omentum, negative for carcinoma.   G. LYMPH NODES, RIGHT PELVIC, DISSECTION:  - Two lymph nodes, negative for carcinoma (0/2).   ONCOLOGY TABLE:   UTERUS, CARCINOMA OR CARCINOSARCOMA: Resection   Procedure: Total hysterectomy and bilateral salpingo-oophorectomy,  Omentectomy, Lymph node sampling  Histologic Type: Serous carcinoma  Histologic Grade: High grade  Myometrial Invasion: > 50%  Uterine Serosa Involvement: Not identified  Cervical Stroma Involvement: Present  Other Tissue/Organ Involvement: Not identified  Peritoneal/Ascitic Fluid: Negative for carcinoma  Lymphovascular Invasion: Present  Regional Lymph Nodes:       Pelvic Lymph Nodes Examined:            2 Sentinel            6 Non-Sentinel            8 Total       Pelvic Lymph Nodes with Metastasis: 4            Macrometastasis: 3            Micrometastasis: 1            Isolated Tumor Cells: 0            Laterality of Lymph Nodes with Tumor: Right (non-sentinel),  Left (sentinel and non-sentinel)            Extracapsular Extension: Present       Para-Aortic Lymph Nodes Examined: Not applicable        Para-Aortic Lymph  Nodes with Metastasis: Not applicable  Distant Metastasis:       Distant Site(s) Involved: Omentum: Not involved  Pathologic Stage Classification (pTNM, AJCC 8th Edition): pT2, pN1a  Ancillary Studies: HER2 will be ordered  Additional Findings: Leiomyomata.  Adenomyosis.  Representative Tumor Block: B1  Comment: Dr. Saralyn Pilar reviewed select slides.  (v4.2.0.1)    01/29/2021 Cancer Staging   Staging form: Corpus Uteri - Carcinoma and Carcinosarcoma, AJCC 8th Edition - Clinical stage from 01/29/2021: FIGO Stage IIIC1 (cT1b, cN1a, cM0) - Signed by Lafonda Mosses, MD on 02/02/2021 Histopathologic type: Mixed  cell adenocarcinoma Stage prefix: Initial diagnosis Method of lymph node assessment: Other Histologic grade (G): G3 Histologic grading system: 3 grade system Lymph-vascular invasion (LVI): LVI present/identified, NOS Peritoneal cytology results: Negative Pelvic nodal status: Positive Number of pelvic nodes positive from dissection: 4 Number of pelvic nodes examined during dissection: 8 Para-aortic status: Not assessed Lymph node metastasis: Present Omentectomy performed: Yes Morcellation performed: No   02/19/2021 Echocardiogram    1. Left ventricular ejection fraction, by estimation, is 55 to 60%. The left ventricle has normal function. The left ventricle demonstrates regional wall motion abnormalities (see scoring diagram/findings for description). There is mild left ventricular  hypertrophy. Left ventricular diastolic parameters are consistent with Grade I diastolic dysfunction (impaired relaxation). There is moderate hypokinesis of the left ventricular, basal septal wall and inferior wall. The average left ventricular global longitudinal strain is -17.9 %. The global longitudinal strain is abnormal.  2. Right ventricular systolic function is normal. The right ventricular size is normal. There is normal pulmonary artery systolic pressure. The estimated right ventricular systolic  pressure is 70.1 mmHg.  3. The mitral valve is abnormal. Trivial mitral valve regurgitation.  4. The aortic valve is tricuspid. Aortic valve regurgitation is not visualized.  5. The inferior vena cava is normal in size with greater than 50% respiratory variability, suggesting right atrial pressure of 3 mmHg.   02/20/2021 Procedure   Successful placement of a right IJ approach Power Port with ultrasound and fluoroscopic guidance. The catheter is ready for use.   02/28/2021 - 06/14/2021 Chemotherapy    Patient is on Treatment Plan: UTERINE CARBOPLATIN AUC 6 / PACLITAXEL Q21D       04/20/2021 Imaging   Status post hysterectomy and suspected bilateral salpingo-oophorectomy.   2.2 cm fluid density lesion along the left pelvic sidewall likely reflects a postoperative seroma.   No findings suspicious for recurrent or metastatic disease.   07/16/2021 - 08/16/2021 Radiation Therapy   Radiation Treatment Dates: 07/16/2021 through 08/16/2021 Site Technique Total Dose (Gy) Dose per Fx (Gy) Completed Fx Beam Energies  Vagina: Pelvis HDR-brachy 30/30 6 5/5 Ir-192        10/01/2021 Imaging   Increasing size of LEFT pelvic lymph nodes, largest approximately 11 mm along the external iliac chain. Given findings and history could consider PET for further evaluation as warranted.   Cystic area along the LEFT pelvic sidewall that was seen previously has improved.   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   Aortic Atherosclerosis (ICD10-I70.0).   12/21/2021 Imaging   1. Enlarging soft tissue masses involving the vaginal cuff. 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 Speculum examination and re-biopsy may be confirmatory. PET-CT may be helpful. 2. New small sub 5 mm perirectal and sigmoid mesocolon nodes. 3. Slight progression of pelvic adenopathy as detailed above. 4. No findings  suspicious for abdominal metastatic disease or osseous metastatic disease. 5. Stable age advanced atherosclerotic calcifications involving the aorta and branch vessels and coronary arteries   12/25/2021 Echocardiogram    1. Global longitudinal strain is -16.1% Mild basal inferior hypoinesis. Compared to echo from 02/2021, no change in overall LVEF /regional wall motion. Global longitudinal strain is mildly less negative (-17.9% to -16.1%. Left ventricular ejection fraction, by estimation, is 65 to 70%. The left ventricle has normal function. The left ventricle has no regional wall motion abnormalities. There is mild left ventricular hypertrophy. Left ventricular diastolic parameters are indeterminate.  2. Right ventricular systolic function is normal. The right ventricular size is normal. There is normal pulmonary artery systolic pressure.  3. Trivial mitral valve regurgitation.  4. The aortic valve is normal in structure. Aortic valve regurgitation is not visualized.  5. The inferior vena cava is normal in size with greater than 50% respiratory variability, suggesting right atrial pressure of 3 mmHg.     01/01/2022 - 01/22/2022 Chemotherapy   Patient is on Treatment Plan : UTERINE SEROUS CARCINOMA Carboplatin + Paclitaxel + Trastuzumab q21d x 6 Cycles / Trastuzumab q21d     03/29/2022 Pathology Results   A.   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   COMMENT:   The carcinoma has diffuse strong Pax8 immunoreactivity (Mullerian marker) and abnormal overexpression of nuclear p53 and relatively diffuse strong p16 staining.  Estrogen receptor (ER) is weakly reactive  and there is focal strong Napsin A immunoreactivity.  The immunophenotype is most characteristic of serous carcinoma.   The history of prior treatment for endometrial serous carcinoma is noted.   04/12/2022  Imaging   1. Surgical change of prior hysterectomy with increased nodular thickening at the vaginal cuff, suspicious for recurrent disease. Consider further evaluation with PET-CT versus direct tissue sampling. 2. Unchanged prominent/mildly enlarged retroperitoneal and pelvic lymph nodes, nonspecific but in the setting of disease recurrence these would be at least somewhat suspicious for nodal involvement. Consider attention on follow-up imaging. 3. No evidence of metastatic disease within the chest. 4. Left-sided colonic diverticulosis without findings of acute diverticulitis. 5.  Aortic Atherosclerosis (ICD10-I70.0).   04/16/2022 -  Chemotherapy   Patient is on Treatment Plan : UTERINE Lenvatinib (20) D1-21 + Pembrolizumab (200) D1 q21d       PHYSICAL EXAMINATION: ECOG PERFORMANCE STATUS: 1 - Symptomatic but completely ambulatory  Vitals:   06/20/22 1139  BP: 112/77  Pulse: 73  Resp: 18  Temp: 97.9 F (36.6 C)  SpO2: 100%   Filed Weights   06/20/22 1139  Weight: 200 lb 12.8 oz (91.1 kg)    GENERAL:alert, no distress and comfortable NEURO: alert & oriented x 3 with fluent speech, no focal motor/sensory deficits  LABORATORY DATA:  I have reviewed the data as listed    Component Value Date/Time   NA 139 05/28/2022 1113   NA 143 10/18/2020 1028   K 4.0 05/28/2022 1113   CL 104 05/28/2022 1113   CO2 27 05/28/2022 1113   GLUCOSE 119 (H) 05/28/2022 1113   BUN 17 05/28/2022 1113   BUN 13 10/18/2020 1028   CREATININE 0.93 05/28/2022 1113   CREATININE 0.79 02/11/2017 0943   CALCIUM 9.9 05/28/2022 1113   PROT 7.6 05/28/2022 1113   PROT 6.7 10/18/2020 1028   ALBUMIN 3.6 05/28/2022 1113   ALBUMIN 4.1 10/18/2020 1028   AST 21 05/28/2022 1113   ALT 25 05/28/2022 1113   ALKPHOS 81 05/28/2022 1113   BILITOT 0.4 05/28/2022 1113   GFRNONAA >60 05/28/2022 1113   GFRNONAA 83 02/11/2017 0943   GFRAA 70 10/18/2020 1028   GFRAA >89 02/11/2017 0943    No results found for:  "SPEP", "UPEP"  Lab Results  Component Value Date   WBC 5.3 06/20/2022   NEUTROABS 3.4 06/20/2022   HGB 11.4 (L) 06/20/2022   HCT 33.0 (L) 06/20/2022   MCV 88.5 06/20/2022   PLT 269 06/20/2022      Chemistry  Component Value Date/Time   NA 139 05/28/2022 1113   NA 143 10/18/2020 1028   K 4.0 05/28/2022 1113   CL 104 05/28/2022 1113   CO2 27 05/28/2022 1113   BUN 17 05/28/2022 1113   BUN 13 10/18/2020 1028   CREATININE 0.93 05/28/2022 1113   CREATININE 0.79 02/11/2017 0943      Component Value Date/Time   CALCIUM 9.9 05/28/2022 1113   ALKPHOS 81 05/28/2022 1113   AST 21 05/28/2022 1113   ALT 25 05/28/2022 1113   BILITOT 0.4 05/28/2022 1113

## 2022-06-21 LAB — T4: T4, Total: 8.7 ug/dL (ref 4.5–12.0)

## 2022-06-28 ENCOUNTER — Ambulatory Visit (HOSPITAL_COMMUNITY): Payer: Medicaid Other | Attending: Cardiology

## 2022-06-28 DIAGNOSIS — I5032 Chronic diastolic (congestive) heart failure: Secondary | ICD-10-CM | POA: Insufficient documentation

## 2022-06-28 DIAGNOSIS — I352 Nonrheumatic aortic (valve) stenosis with insufficiency: Secondary | ICD-10-CM | POA: Diagnosis not present

## 2022-06-28 DIAGNOSIS — Z5111 Encounter for antineoplastic chemotherapy: Secondary | ICD-10-CM | POA: Insufficient documentation

## 2022-06-28 LAB — ECHOCARDIOGRAM COMPLETE
Area-P 1/2: 2.84 cm2
S' Lateral: 2.2 cm

## 2022-07-05 ENCOUNTER — Encounter: Payer: Self-pay | Admitting: Hematology and Oncology

## 2022-07-05 ENCOUNTER — Other Ambulatory Visit: Payer: Self-pay | Admitting: Hematology and Oncology

## 2022-07-05 ENCOUNTER — Telehealth: Payer: Self-pay

## 2022-07-05 ENCOUNTER — Other Ambulatory Visit (HOSPITAL_COMMUNITY): Payer: Self-pay

## 2022-07-05 MED ORDER — AMLODIPINE BESYLATE 10 MG PO TABS
10.0000 mg | ORAL_TABLET | Freq: Every day | ORAL | 1 refills | Status: DC
  Filled 2022-07-05: qty 30, 30d supply, fill #0

## 2022-07-05 NOTE — Telephone Encounter (Signed)
Called pt, spoke with her daughter who wanted clarification on BP meds.  Reviewed norvasc dose, '10mg'$ .  Daughter verbalized understanding and thanks

## 2022-07-08 ENCOUNTER — Other Ambulatory Visit: Payer: Self-pay

## 2022-07-09 ENCOUNTER — Other Ambulatory Visit: Payer: Self-pay

## 2022-07-09 ENCOUNTER — Inpatient Hospital Stay: Payer: Medicaid Other

## 2022-07-09 ENCOUNTER — Ambulatory Visit (HOSPITAL_COMMUNITY)
Admission: RE | Admit: 2022-07-09 | Discharge: 2022-07-09 | Disposition: A | Discharge status: Home / Self Care | Payer: Medicaid Other | Source: Ambulatory Visit | Attending: Hematology and Oncology | Admitting: Hematology and Oncology

## 2022-07-09 ENCOUNTER — Other Ambulatory Visit (HOSPITAL_COMMUNITY): Payer: Self-pay

## 2022-07-09 ENCOUNTER — Telehealth: Payer: Self-pay

## 2022-07-09 DIAGNOSIS — C541 Malignant neoplasm of endometrium: Secondary | ICD-10-CM

## 2022-07-09 DIAGNOSIS — Z5112 Encounter for antineoplastic immunotherapy: Secondary | ICD-10-CM | POA: Diagnosis not present

## 2022-07-09 LAB — CBC WITH DIFFERENTIAL (CANCER CENTER ONLY)
Abs Immature Granulocytes: 0.01 10*3/uL (ref 0.00–0.07)
Basophils Absolute: 0 10*3/uL (ref 0.0–0.1)
Basophils Relative: 0 %
Eosinophils Absolute: 0 10*3/uL (ref 0.0–0.5)
Eosinophils Relative: 1 %
HCT: 33 % — ABNORMAL LOW (ref 36.0–46.0)
Hemoglobin: 11.3 g/dL — ABNORMAL LOW (ref 12.0–15.0)
Immature Granulocytes: 0 %
Lymphocytes Relative: 34 %
Lymphs Abs: 1.8 10*3/uL (ref 0.7–4.0)
MCH: 30.2 pg (ref 26.0–34.0)
MCHC: 34.2 g/dL (ref 30.0–36.0)
MCV: 88.2 fL (ref 80.0–100.0)
Monocytes Absolute: 0.5 10*3/uL (ref 0.1–1.0)
Monocytes Relative: 10 %
Neutro Abs: 2.9 10*3/uL (ref 1.7–7.7)
Neutrophils Relative %: 55 %
Platelet Count: 256 10*3/uL (ref 150–400)
RBC: 3.74 MIL/uL — ABNORMAL LOW (ref 3.87–5.11)
RDW: 14.7 % (ref 11.5–15.5)
WBC Count: 5.3 10*3/uL (ref 4.0–10.5)
nRBC: 0 % (ref 0.0–0.2)

## 2022-07-09 LAB — CMP (CANCER CENTER ONLY)
ALT: 40 U/L (ref 0–44)
AST: 32 U/L (ref 15–41)
Albumin: 3.7 g/dL (ref 3.5–5.0)
Alkaline Phosphatase: 165 U/L — ABNORMAL HIGH (ref 38–126)
Anion gap: 9 (ref 5–15)
BUN: 14 mg/dL (ref 8–23)
CO2: 27 mmol/L (ref 22–32)
Calcium: 9.5 mg/dL (ref 8.9–10.3)
Chloride: 104 mmol/L (ref 98–111)
Creatinine: 0.84 mg/dL (ref 0.44–1.00)
GFR, Estimated: >60 mL/min (ref >60)
Glucose, Bld: 126 mg/dL — ABNORMAL HIGH (ref 70–99)
Potassium: 3.5 mmol/L (ref 3.5–5.1)
Sodium: 140 mmol/L (ref 135–145)
Total Bilirubin: 0.5 mg/dL (ref 0.3–1.2)
Total Protein: 7.2 g/dL (ref 6.5–8.1)

## 2022-07-09 LAB — TOTAL PROTEIN, URINE DIPSTICK: Protein, ur: 300 mg/dL — AB

## 2022-07-09 LAB — TSH: TSH: 4.533 u[IU]/mL — ABNORMAL HIGH (ref 0.350–4.500)

## 2022-07-09 MED ORDER — HEPARIN SOD (PORK) LOCK FLUSH 100 UNIT/ML IV SOLN
500.0000 [IU] | Freq: Once | INTRAVENOUS | Status: AC
Start: 2022-07-09 — End: 2022-07-09
  Administered 2022-07-09: 500 [IU] via INTRAVENOUS

## 2022-07-09 MED ORDER — IOHEXOL 300 MG/ML  SOLN
100.0000 mL | Freq: Once | INTRAMUSCULAR | Status: AC | PRN
  Administered 2022-07-09: 100 mL via INTRAVENOUS

## 2022-07-09 MED ORDER — SODIUM CHLORIDE (PF) 0.9 % IJ SOLN
INTRAMUSCULAR | Status: AC
  Filled 2022-07-09: qty 50

## 2022-07-09 MED ORDER — SODIUM CHLORIDE 0.9% FLUSH
10.0000 mL | Freq: Once | INTRAVENOUS | Status: AC
  Administered 2022-07-09: 10 mL

## 2022-07-09 MED ORDER — HEPARIN SOD (PORK) LOCK FLUSH 100 UNIT/ML IV SOLN
INTRAVENOUS | Status: AC
  Filled 2022-07-09: qty 5

## 2022-07-09 NOTE — Telephone Encounter (Signed)
Called and given below message to [place]. She verbalized understanding and will stop then Lenvima.

## 2022-07-09 NOTE — Telephone Encounter (Signed)
-----   Message from Heath Lark, MD sent at 07/09/2022 11:39 AM EDT ----- Regarding: high urine protein Pls call her daughter [name] and make sure she is checking her BP Her urine protein is very high I recommend she hold Lenvima until I see her

## 2022-07-10 ENCOUNTER — Other Ambulatory Visit (HOSPITAL_COMMUNITY): Payer: Self-pay

## 2022-07-10 NOTE — Progress Notes (Deleted)
Cardiology Office Note:    Date:  07/10/2022   ID:  [name], DOB October 20, 1956, MRN [id number]  PCP:  [name], [place] Cardiologist: Sanda Klein, MD   Reason for visit: 3 mo follow-up post echo  History of Present Illness:    [name] is a 66 y.o. female with a hx of  chronic diastolic heart failure due to a combination of hypertensive and ischemic heart disease, s/p total abdominal hysterectomy with bilateral salpingo-oophorectomy with suspicion for residual endometrial cancer based on imaging studies, obesity, CAD status post stent to RCA 2016.    She last saw Dr. Dr. Sallyanne Kuster in April 2023.  She was doing well only taking Lasix 20 mg every other day without heart failure symptoms.  She is planned to start treatment for endometrial cancer with pembrolizumab and lenvatinib.  Dr. Dr. Sallyanne Kuster planned echo after her first cycle of therapy and every 3 months thereafter.  Monitor for heart failure decompensation and atrial fibrillation.  Today, ***  Chronic diastolic heart failure -2D echo June 28, 2022 with EF 60 to 65%, mild LVH, grade 1 diastolic dysfunction, normal RV no significant valve disease. -Repeat 2D echo every 3 months while on cancer therapies per Dr. Sallyanne Kuster -***  Coronary artery disease -PCI to RCA 2016 -After an abnormal stress test that described extensive anterior and lateral ischemia in August 2019.  Cardiac catheterization showed occlusion of the distal left circumflex coronary artery, multiple moderate stenoses in  relatively small vessels (70% distal LAD, 80% OM1, 50% OM 2) and a widely patent stent in the right coronary artery with a maximum stenosis of 20%.  Medical therapy was recommended. -***  Hypertension -*** -Goal BP is <130/80.  Recommend DASH diet (high in vegetables, fruits, low-fat dairy products, whole grains, poultry, fish, and nuts and low in sweets, sugar-sweetened beverages, and red meats), salt restriction and  increase physical activity.  Hyperlipidemia -*** -Discussed cholesterol lowering diets - Mediterranean diet, DASH diet, vegetarian diet, low-carbohydrate diet and avoidance of trans fats.  Discussed healthier choice substitutes.  Nuts, high-fiber foods, and fiber supplements may also improve lipids.    Obesity -Discussed how even a 5-10% weight loss can have cardiovascular benefits.   -Recommend moderate intensity activity for 30 minutes 5 days/week and the DASH diet.  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  Dr Gery Pray   Hyperlipidemia LDL goal < 70 06/07/2013   Hypertension    Obesity    S/P coronary artery stent placement to RCA with PROMUS DES, 06/07/13, residual disease on OM1, OM2 and rPDA 06/07/2013    Past Surgical History:  Procedure Laterality Date   CARDIAC CATHETERIZATION N/A 08/22/2015   Procedure: Left Heart Cath and Coronary Angiography;  Surgeon: Leonie Man, MD;  Location: [place] [facility];  Service: Cardiovascular;  Laterality: N/A;   CARDIAC CATHETERIZATION N/A 08/22/2015   Procedure: Coronary Stent Intervention;  Surgeon: Leonie Man, MD;  Location: [place] [facility];  Service: Cardiovascular;  Laterality: N/A;   CARDIAC CATHETERIZATION N/A 08/22/2015   Procedure: Left Heart Cath and Coronary Angiography;  Surgeon: Leonie Man, MD;  Location: [place] [facility];  Service: Cardiovascular;  Laterality: N/A;   CORONARY STENT PLACEMENT  2006   TAXUS stent CFX in [facility]   IR IMAGING GUIDED PORT INSERTION  02/20/2021   LEFT HEART CATH AND CORONARY ANGIOGRAPHY N/A 07/21/2018   Procedure: LEFT  HEART CATH AND CORONARY ANGIOGRAPHY;  Surgeon: Troy Sine, MD;  Location: [place] [facility];  Service: Cardiovascular;  Laterality: N/A;   LEFT HEART CATHETERIZATION WITH CORONARY ANGIOGRAM N/A 06/07/2013   Procedure: LEFT HEART CATHETERIZATION WITH CORONARY ANGIOGRAM;  Surgeon: Sanda Klein, MD;  Location: [place] [facility];  Service: Cardiovascular;  Laterality: N/A;   LYMPH NODE DISSECTION N/A 01/29/2021   Procedure: SELECTIVE LYMPH NODE DISSECTION;  Surgeon: Lafonda Mosses, MD;  Location: [facility];  Service: Gynecology;  Laterality: N/A;   ROBOTIC ASSISTED TOTAL HYSTERECTOMY WITH BILATERAL SALPINGO OOPHERECTOMY Bilateral 01/29/2021   Procedure: XI ROBOTIC ASSISTED TOTAL HYSTERECTOMY WITH BILATERAL SALPINGO OOPHORECTOMY,;  Surgeon: Lafonda Mosses, MD;  Location: [facility];  Service: Gynecology;  Laterality: Bilateral;   SENTINEL NODE BIOPSY N/A 01/29/2021   Procedure: SENTINEL NODE BIOPSY;  Surgeon: Lafonda Mosses, MD;  Location: [facility];  Service: Gynecology;  Laterality: N/A;    Current Medications: No outpatient medications have been marked as taking for the 07/11/22 encounter (Appointment) with Warren Lacy, PA-C.     Allergies:   Patient has no known allergies.   Social History   Socioeconomic History   Marital status: Divorced    Spouse name: Not on file   Number of children: 1   Years of education: Not on file   Highest education level: Not on file  Occupational History   Occupation: Scientist, water quality  Tobacco Use   Smoking status: Former    Types: Cigars    Quit date: 06/23/2004    Years since quitting: 18.0   Smokeless tobacco: Never   Tobacco comments:    "a few black and milds a day" CigrettesX 30 years  Vaping Use   Vaping Use: Never used  Substance and Sexual Activity   Alcohol use: Yes    Comment: occ   Drug use: No   Sexual activity: Not Currently  Other Topics Concern   Not on file  Social History Narrative   Not on file   Social Determinants of  Health   Financial Resource Strain: Not on file  Food Insecurity: No Food Insecurity (01/12/2021)   Hunger Vital Sign    Worried About Running Out of Food in the Last Year: Never true    Ran Out of Food in the Last Year: Never true  Transportation Needs: No Transportation Needs (01/12/2021)   PRAPARE - Hydrologist (Medical): No    Lack of Transportation (Non-Medical): No  Physical Activity: Not on file  Stress: Not on file  Social Connections: Not on file     Family History: The patient's family history includes Colon cancer in her father and mother; Diabetes in her brother, mother, and sister; Hypertension in her brother and mother. There is no history of Breast cancer, Ovarian cancer, Endometrial cancer, Prostate cancer, or Pancreatic cancer.  ROS:   Please see the history of present illness.     EKGs/Labs/Other Studies Reviewed:    EKG:  The ekg ordered today demonstrates ***  Recent Labs: 02/12/2022: Magnesium 1.8 07/09/2022: ALT 40; BUN 14; Creatinine 0.84; Hemoglobin 11.3; Platelet Count 256; Potassium 3.5; Sodium 140; TSH 4.533  Recent Lipid Panel Lab Results  Component Value Date/Time   CHOL 153 09/20/2021 12:05 PM   TRIG 101 09/20/2021 12:05 PM   HDL 56 09/20/2021 12:05 PM   [place] 79 09/20/2021 12:05 PM    Physical Exam:    VS:  There were no vitals taken for this visit.   No data found.  No BP recorded.  {Refresh Note OR Click here to enter BP  :1}***    Wt Readings from Last 3 Encounters:  06/20/22 200 lb 12.8 oz (91.1 kg)  05/28/22 208 lb 6.4 oz (94.5 kg)  05/07/22 209 lb (94.8 kg)     GEN: *** Well nourished, well developed in no acute distress HEENT: Normal NECK: No JVD; No carotid bruits CARDIAC: ***RRR, no murmurs, rubs, gallops RESPIRATORY:  Clear to auscultation without rales, wheezing or rhonchi  ABDOMEN: Soft, non-tender, non-distended MUSCULOSKELETAL: No edema; No deformity  SKIN: Warm and dry NEUROLOGIC:   Alert and oriented PSYCHIATRIC:  Normal affect     ASSESSMENT AND PLAN   ***   {Are you ordering a CV Procedure (e.g. stress test, cath, DCCV, TEE, etc)?   Press F2        :[id number]}    Medication Adjustments/Labs and Tests Ordered: Current medicines are reviewed at length with the patient today.  Concerns regarding medicines are outlined above.  No orders of the defined types were placed in this encounter.  No orders of the defined types were placed in this encounter.   There are no Patient Instructions on file for this visit.   Signed, Warren Lacy, PA-C  07/10/2022 2:20 PM    [facility]

## 2022-07-11 ENCOUNTER — Other Ambulatory Visit (HOSPITAL_COMMUNITY): Payer: Self-pay

## 2022-07-11 ENCOUNTER — Ambulatory Visit: Payer: Medicaid Other | Admitting: Physician Assistant

## 2022-07-11 ENCOUNTER — Inpatient Hospital Stay: Payer: Medicaid Other

## 2022-07-11 ENCOUNTER — Inpatient Hospital Stay (HOSPITAL_BASED_OUTPATIENT_CLINIC_OR_DEPARTMENT_OTHER): Payer: Medicaid Other | Admitting: Hematology and Oncology

## 2022-07-11 ENCOUNTER — Encounter: Payer: Self-pay | Admitting: Hematology and Oncology

## 2022-07-11 DIAGNOSIS — D6481 Anemia due to antineoplastic chemotherapy: Secondary | ICD-10-CM | POA: Diagnosis not present

## 2022-07-11 DIAGNOSIS — C541 Malignant neoplasm of endometrium: Secondary | ICD-10-CM | POA: Diagnosis not present

## 2022-07-11 DIAGNOSIS — I25118 Atherosclerotic heart disease of native coronary artery with other forms of angina pectoris: Secondary | ICD-10-CM

## 2022-07-11 DIAGNOSIS — I10 Essential (primary) hypertension: Secondary | ICD-10-CM

## 2022-07-11 DIAGNOSIS — Z5112 Encounter for antineoplastic immunotherapy: Secondary | ICD-10-CM | POA: Diagnosis not present

## 2022-07-11 DIAGNOSIS — T451X5A Adverse effect of antineoplastic and immunosuppressive drugs, initial encounter: Secondary | ICD-10-CM | POA: Diagnosis not present

## 2022-07-11 DIAGNOSIS — I5032 Chronic diastolic (congestive) heart failure: Secondary | ICD-10-CM

## 2022-07-11 LAB — T4: T4, Total: 8.7 ug/dL (ref 4.5–12.0)

## 2022-07-11 MED ORDER — SODIUM CHLORIDE 0.9 % IV SOLN
200.0000 mg | Freq: Once | INTRAVENOUS | Status: AC
  Administered 2022-07-11: 200 mg via INTRAVENOUS
  Filled 2022-07-11: qty 200

## 2022-07-11 MED ORDER — SODIUM CHLORIDE 0.9% FLUSH
10.0000 mL | INTRAVENOUS | Status: DC | PRN
  Administered 2022-07-11: 10 mL

## 2022-07-11 MED ORDER — SODIUM CHLORIDE 0.9 % IV SOLN: Freq: Once | INTRAVENOUS | Status: AC

## 2022-07-11 MED ORDER — HEPARIN SOD (PORK) LOCK FLUSH 100 UNIT/ML IV SOLN
500.0000 [IU] | Freq: Once | INTRAVENOUS | Status: AC | PRN
  Administered 2022-07-11: 500 [IU]

## 2022-07-11 MED ORDER — SODIUM CHLORIDE 0.9% FLUSH
10.0000 mL | Freq: Once | INTRAVENOUS | Status: AC
  Administered 2022-07-11: 10 mL

## 2022-07-11 MED ORDER — DOXAZOSIN MESYLATE 4 MG PO TABS
4.0000 mg | ORAL_TABLET | Freq: Every day | ORAL | 1 refills | Status: DC
  Filled 2022-07-11: qty 30, 30d supply, fill #0

## 2022-07-11 NOTE — Assessment & Plan Note (Addendum)
We discussed importance of blood pressure management Her Lenvima was placed on hold and her blood pressure has normalized I recommend addition of Cardura We discussed importance of blood pressure monitoring She is instructed to call me if her systolic blood pressure is over 301 or diastolic over 95 We have extensive discussions about the importance of dietary modification

## 2022-07-11 NOTE — Patient Instructions (Signed)
[facility]   Discharge Instructions: Thank you for choosing [facility] to provide your oncology and hematology care.   If you have a lab appointment with the Cancer Center, please go directly to the Cancer Center and check in at the registration area.   Wear comfortable clothing and clothing appropriate for easy access to any Portacath or PICC line.   We strive to give you quality time with your provider. You may need to reschedule your appointment if you arrive late (15 or more minutes).  Arriving late affects you and other patients whose appointments are after yours.  Also, if you miss three or more appointments without notifying the office, you may be dismissed from the clinic at the provider's discretion.      For prescription refill requests, have your pharmacy contact our office and allow 72 hours for refills to be completed.    Today you received the following chemotherapy and/or immunotherapy agents: Pembrolizumab (Keytruda)      To help prevent nausea and vomiting after your treatment, we encourage you to take your nausea medication as directed.  BELOW ARE SYMPTOMS THAT SHOULD BE REPORTED IMMEDIATELY: *FEVER GREATER THAN 100.4 F (38 C) OR HIGHER *CHILLS OR SWEATING *NAUSEA AND VOMITING THAT IS NOT CONTROLLED WITH YOUR NAUSEA MEDICATION *UNUSUAL SHORTNESS OF BREATH *UNUSUAL BRUISING OR BLEEDING *URINARY PROBLEMS (pain or burning when urinating, or frequent urination) *BOWEL PROBLEMS (unusual diarrhea, constipation, pain near the anus) TENDERNESS IN MOUTH AND THROAT WITH OR WITHOUT PRESENCE OF ULCERS (sore throat, sores in mouth, or a toothache) UNUSUAL RASH, SWELLING OR PAIN  UNUSUAL VAGINAL DISCHARGE OR ITCHING   Items with * indicate a potential emergency and should be followed up as soon as possible or go to the Emergency Department if any problems should occur.  Please show the CHEMOTHERAPY ALERT CARD or IMMUNOTHERAPY ALERT  CARD at check-in to the Emergency Department and triage nurse.  Should you have questions after your visit or need to cancel or reschedule your appointment, please contact [facility]  Dept: [phone]  and follow the prompts.  Office hours are 8:00 a.m. to 4:30 p.m. Monday - Friday. Please note that voicemails left after 4:00 p.m. may not be returned until the following business day.  We are closed weekends and major holidays. You have access to a nurse at all times for urgent questions. Please call the main number to the clinic Dept: [phone] and follow the prompts.   For any non-urgent questions, you may also contact your provider using MyChart. We now offer e-Visits for anyone 18 and older to request care online for non-urgent symptoms. For details visit [email or web address].   Also download the MyChart app! Go to the app store, search "MyChart", open the app, select [facility], and log in with your MyChart username and password.  Masks are optional in the cancer centers. If you would like for your care team to wear a mask while they are taking care of you, please let them know. For doctor visits, patients may have with them one support person who is at least 64 years old. At this time, visitors are not allowed in the infusion area. 

## 2022-07-11 NOTE — Assessment & Plan Note (Addendum)
I have reviewed multiple imaging studies with the patient and her family She has slight growth in the lymph nodes but overall they are still quite small The biggest concern I have is the area near the vaginal cuff that could increase her risk of bleeding While she tolerated treatment well, she has uncontrolled hypertension which I believe stems from poor dietary choices and lack of close monitoring of her blood pressure We discussed the role of switching treatment She had difficulties tolerating chemotherapy with severe intermittent abdominal pain with each cycle of treatment leading to hospitalization We discussed prognosis with and without treatment Ultimately, I plan to proceed with treatment as scheduled for 3 more cycles before repeating imaging study

## 2022-07-11 NOTE — Progress Notes (Signed)
[name] OFFICE PROGRESS NOTE  Patient Care Team: Elizabeth Rakes, MD as PCP - General (Family Medicine) [name], Elizabeth Gobble, MD as PCP - Cardiology (Cardiology)  ASSESSMENT & PLAN:  Papillary serous adenocarcinoma of endometrium [name]) I have reviewed multiple imaging studies with the patient and her family She has slight growth in the lymph nodes but overall they are still quite small The biggest concern I have is the area near the vaginal cuff that could increase her risk of bleeding While she tolerated treatment well, she has uncontrolled hypertension which I believe stems from poor dietary choices and lack of close monitoring of her blood pressure We discussed the role of switching treatment She had difficulties tolerating chemotherapy with severe intermittent abdominal pain with each cycle of treatment leading to hospitalization We discussed prognosis with and without treatment Ultimately, I plan to proceed with treatment as scheduled for 3 more cycles before repeating imaging study   Essential hypertension We discussed importance of blood pressure management Her Lenvima was placed on hold and her blood pressure has normalized I recommend addition of Cardura We discussed importance of blood pressure monitoring She is instructed to call me if her systolic blood pressure is over 829 or diastolic over 95 We have extensive discussions about the importance of dietary modification  Anemia due to antineoplastic chemotherapy She has multifactorial anemia, component of anemia chronic illness and from prior treatment Observe closely  No orders of the defined types were placed in this encounter.   All questions were answered. The patient knows to call the clinic with any problems, questions or concerns. The total time spent in the appointment was 40 minutes encounter with patients including review of chart and various tests results, discussions about plan of care and  coordination of care plan   Elizabeth Lark, MD 07/11/2022 12:29 PM  INTERVAL HISTORY: Please see below for problem oriented charting. she returns for treatment follow-up on pembrolizumab and Lenvima for recurrent metastatic uterine cancer She did not bring her blood pressure reading today According to the daughter, her diastolic blood pressure has been high She denies recent vaginal bleeding or abdominal pain  REVIEW OF SYSTEMS:   Constitutional: Denies fevers, chills or abnormal weight loss Eyes: Denies blurriness of vision Ears, nose, mouth, throat, and face: Denies mucositis or sore throat Respiratory: Denies cough, dyspnea or wheezes Cardiovascular: Denies palpitation, chest discomfort or lower extremity swelling Gastrointestinal:  Denies nausea, heartburn or change in bowel habits Skin: Denies abnormal skin rashes Lymphatics: Denies new lymphadenopathy or easy bruising Neurological:Denies numbness, tingling or new weaknesses Behavioral/Psych: Mood is stable, no new changes  All other systems were reviewed with the patient and are negative.  I have reviewed the past medical history, past surgical history, social history and family history with the patient and they are unchanged from previous note.  ALLERGIES:  has No Known Allergies.  MEDICATIONS:  Current Outpatient Medications  Medication Sig Dispense Refill   doxazosin (CARDURA) 4 MG tablet Take 1 tablet (4 mg total) by mouth daily. 30 tablet 1   amLODipine (NORVASC) 10 MG tablet Take 1 tablet (10 mg total) by mouth daily. 30 tablet 1   aspirin 81 MG EC tablet Take 1 tablet (81 mg total) by mouth daily. 30 tablet 12   atorvastatin (LIPITOR) 80 MG tablet TAKE 1 TABLET (80 MG TOTAL) BY MOUTH AT BEDTIME. 30 tablet 6   cholestyramine light (PREVALITE) 4 g packet Dissolve in water as directed then take 1 packet by mouth daily  at 12 noon. 30 each 0   Dulaglutide (TRULICITY) 1.5 Trudy Kory/7.7OE SOPN Inject 1.5 mg into the skin once a week. 2  mL 6   furosemide (LASIX) 20 MG tablet Take 1 tablet (20 mg total) by mouth every other day. 30 tablet 3   glimepiride (AMARYL) 4 MG tablet Take 1 tablet (4 mg total) by mouth daily with breakfast. 30 tablet 6   isosorbide mononitrate (IMDUR) 30 MG 24 hr tablet Take 2 tablets (60 mg total) by mouth daily. 60 tablet 6   lenvatinib 10 mg daily dose (LENVIMA, 10 MG DAILY DOSE,) capsule Take 1 capsule (10 mg total) by mouth daily. (Patient not taking: Reported on 04/12/2022) 30 capsule 11   lisinopril (ZESTRIL) 20 MG tablet TAKE 1 TABLET BY MOUTH ONCE A DAY 30 tablet 6   metFORMIN (GLUCOPHAGE) 500 MG tablet Take 2 tablets (1,000 mg total) by mouth 2 (two) times daily with a meal. 120 tablet 6   metoprolol tartrate (LOPRESSOR) 100 MG tablet TAKE 1 TABLET (100 MG TOTAL) BY MOUTH 2 (TWO) TIMES DAILY. 60 tablet 6   nitroGLYCERIN (NITROSTAT) 0.4 MG SL tablet Place 1 tablet (0.4 mg total) under the tongue every 5 (five) minutes as needed for chest pain. 25 tablet 2   No current facility-administered medications for this visit.   Facility-Administered Medications Ordered in Other Visits  Medication Dose Route Frequency Provider Last Rate Last Admin   heparin lock flush 100 unit/mL  500 Units Intracatheter Once PRN [name], Elizabeth Beste, MD       pembrolizumab (KEYTRUDA) 200 mg in sodium chloride 0.9 % 50 mL chemo infusion  200 mg Intravenous Once [name], Elizabeth Iovino, MD 116 mL/hr at 07/11/22 1218 200 mg at 07/11/22 1218   sodium chloride flush (NS) 0.9 % injection 10 mL  10 mL Intracatheter PRN Elizabeth Lark, MD        SUMMARY OF ONCOLOGIC HISTORY: Oncology History Overview Note  Pap 12/14/20: adenocarcinoma, HPV negative  HER-2 positive by FISH Her-2 equivocal by [facility] stable    Papillary serous adenocarcinoma of endometrium ([place])   Initial Diagnosis   Endometrial carcinoma ([place])   01/12/2021 Initial Biopsy   A. ENDOCERVIX, CURETTAGE:  - Adenocarcinoma.  B. ENDOMETRIUM, BIOPSY:  - Adenocarcinoma.  COMMENT:   The differential diagnosis includes high grade serous carcinoma.  Both  specimens have a similar morphology; high grade serous carcinoma more  commonly arises in the endometrium.  Results reported to Dr. Hale Bogus  on 01/15/2021.  Dr. Saralyn Pilar reviewed the case.    01/16/2021 Tumor Marker   Patient's tumor was tested for the following markers: CA-125 Results of the tumor marker test revealed normal value, 10.7   01/25/2021 Imaging   CT C/A/P: 1. Small volume fluid in the endometrial canal with 7 mm hypoattenuating lesion in the myometrium of the anterior fundus. 2. No definite evidence for metastatic disease in the chest, abdomen, or pelvis. Small lymph nodes are noted along both pelvic sidewalls. Attention on follow-up recommended. 3. 5 mm ground-glass opacity peripheral left upper lobe. This is likely related to infection/inflammation and potentially scar. Attention on surveillance imaging recommended. 4. 5.8 cm lesion posterior lower uterine segment likely a fibroid. Ultrasound exam from 01/28/2010 demonstrated a 5.2 cm lesion in the left aspect of the posterior lower uterine body. 5. Aortic Atherosclerosis (ICD10-I70.0).   01/29/2021 Surgery   TRH/BSO, SLN biopsy left, selective pelvic LND right, omentectomy  Findings: On EUA, 10-12cm enlarged mobile uterus. On intra-abdomina entry, minimal adhesions between  the liver and anterior abdomen on the right. Some changes c/w fatty liver on the left. Omentum, stomach, small and large bowel all grossly normal. Bilateral ovaries normal appearing. Uterus with 5-6cm posterior fibroid, otherwise normal appearing. No mapping to right pelvis. On left, mapping to the level of the superior vessel artery, enlarged lymph node noted (likely sentinel) within the upper aspect of the obturator space. In bilateral pelvic basins, multiple enlarged lymph nodes. On the right, enlarged lymph nodes extended superiorly along the common iliac vessels. At the end of  surgery, no obvious abdominal or pelvic evidence of disease.   01/29/2021 Pathology Results   A. SENTINEL LYMPH NODE, LEFT INTERNAL ILIAC, BIOPSY:  - Metastatic carcinoma in (1) of (1) lymph node.   B. SENTINEL LYMPH NODE, LEFT OBTURATOR, BIOPSY:  - Metastatic carcinoma in (1) of (1) lymph node.   C. LYMPH NODES, LEFT PELVIC, DISSECTION:  - Metastatic carcinoma in (1) of (3) lymph nodes.   D. LYMPH NODE, RIGHT EXTERNAL AND COMMON ILIAC, BIOPSY:  - Metastatic carcinoma in (1) of (1) lymph node.   E. UTERUS, CERVIX AND BILATERAL FALLOPIAN TUBES AND OVARIES, TOTAL  HYSTERECTOMY AND BILATERAL SALPINGO-OOPHORECTOMY:  - High grade serous carcinoma of endometrium, with invasion more than  half of the myometrium.  - Tumor invades the stromal connective tissue of the cervix.  - No involvement of uterine serosa or adnexa.  - Lymphovascular invasion is identified.  - See oncology table.   F. OMENTUM, OMENTECTOMY:  - Omentum, negative for carcinoma.   G. LYMPH NODES, RIGHT PELVIC, DISSECTION:  - Two lymph nodes, negative for carcinoma (0/2).   ONCOLOGY TABLE:   UTERUS, CARCINOMA OR CARCINOSARCOMA: Resection   Procedure: Total hysterectomy and bilateral salpingo-oophorectomy,  Omentectomy, Lymph node sampling  Histologic Type: Serous carcinoma  Histologic Grade: High grade  Myometrial Invasion: > 50%  Uterine Serosa Involvement: Not identified  Cervical Stroma Involvement: Present  Other Tissue/Organ Involvement: Not identified  Peritoneal/Ascitic Fluid: Negative for carcinoma  Lymphovascular Invasion: Present  Regional Lymph Nodes:       Pelvic Lymph Nodes Examined:            2 Sentinel            6 Non-Sentinel            8 Total       Pelvic Lymph Nodes with Metastasis: 4            Macrometastasis: 3            Micrometastasis: 1            Isolated Tumor Cells: 0            Laterality of Lymph Nodes with Tumor: Right (non-sentinel),  Left (sentinel and non-sentinel)             Extracapsular Extension: Present       Para-Aortic Lymph Nodes Examined: Not applicable        Para-Aortic Lymph Nodes with Metastasis: Not applicable  Distant Metastasis:       Distant Site(s) Involved: Omentum: Not involved  Pathologic Stage Classification (pTNM, AJCC 8th Edition): pT2, pN1a  Ancillary Studies: HER2 will be ordered  Additional Findings: Leiomyomata.  Adenomyosis.  Representative Tumor Block: B1  Comment: Dr. Saralyn Pilar reviewed select slides.  (v4.2.0.1)    01/29/2021 Cancer Staging   Staging form: Corpus Uteri - Carcinoma and Carcinosarcoma, AJCC 8th Edition - Clinical stage from 01/29/2021: FIGO Stage IIIC1 (cT1b, cN1a, cM0) - Signed by  Lafonda Mosses, MD on 02/02/2021 Histopathologic type: Mixed cell adenocarcinoma Stage prefix: Initial diagnosis Method of lymph node assessment: Other Histologic grade (G): G3 Histologic grading system: 3 grade system Lymph-vascular invasion (LVI): LVI present/identified, NOS Peritoneal cytology results: Negative Pelvic nodal status: Positive Number of pelvic nodes positive from dissection: 4 Number of pelvic nodes examined during dissection: 8 Para-aortic status: Not assessed Lymph node metastasis: Present Omentectomy performed: Yes Morcellation performed: No   02/19/2021 Echocardiogram    1. Left ventricular ejection fraction, by estimation, is 55 to 60%. The left ventricle has normal function. The left ventricle demonstrates regional wall motion abnormalities (see scoring diagram/findings for description). There is mild left ventricular  hypertrophy. Left ventricular diastolic parameters are consistent with Grade I diastolic dysfunction (impaired relaxation). There is moderate hypokinesis of the left ventricular, basal septal wall and inferior wall. The average left ventricular global longitudinal strain is -17.9 %. The global longitudinal strain is abnormal.  2. Right ventricular systolic function is normal. The right  ventricular size is normal. There is normal pulmonary artery systolic pressure. The estimated right ventricular systolic pressure is 32.1 mmHg.  3. The mitral valve is abnormal. Trivial mitral valve regurgitation.  4. The aortic valve is tricuspid. Aortic valve regurgitation is not visualized.  5. The inferior vena cava is normal in size with greater than 50% respiratory variability, suggesting right atrial pressure of 3 mmHg.   02/20/2021 Procedure   Successful placement of a right IJ approach Power Port with ultrasound and fluoroscopic guidance. The catheter is ready for use.   02/28/2021 - 06/14/2021 Chemotherapy    Patient is on Treatment Plan: UTERINE CARBOPLATIN AUC 6 / PACLITAXEL Q21D       04/20/2021 Imaging   Status post hysterectomy and suspected bilateral salpingo-oophorectomy.   2.2 cm fluid density lesion along the left pelvic sidewall likely reflects a postoperative seroma.   No findings suspicious for recurrent or metastatic disease.   07/16/2021 - 08/16/2021 Radiation Therapy   Radiation Treatment Dates: 07/16/2021 through 08/16/2021 Site Technique Total Dose (Gy) Dose per Fx (Gy) Completed Fx Beam Energies  Vagina: Pelvis HDR-brachy 30/30 6 5/5 Ir-192        10/01/2021 Imaging   Increasing size of LEFT pelvic lymph nodes, largest approximately 11 mm along the external iliac chain. Given findings and history could consider PET for further evaluation as warranted.   Cystic area along the LEFT pelvic sidewall that was seen previously has improved.   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   Aortic Atherosclerosis (ICD10-I70.0).   12/21/2021 Imaging   1. Enlarging soft tissue masses involving the vaginal cuff. 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 Speculum examination and re-biopsy may be confirmatory. PET-CT may be helpful. 2. New small sub 5 mm  perirectal and sigmoid mesocolon nodes. 3. Slight progression of pelvic adenopathy as detailed above. 4. No findings suspicious for abdominal metastatic disease or osseous metastatic disease. 5. Stable age advanced atherosclerotic calcifications involving the aorta and branch vessels and coronary arteries   12/25/2021 Echocardiogram    1. Global longitudinal strain is -16.1% Mild basal inferior hypoinesis. Compared to echo from 02/2021, no change in overall LVEF /regional wall motion. Global longitudinal strain is mildly less negative (-17.9% to -16.1%. Left ventricular ejection fraction, by estimation, is 65 to 70%. The left ventricle has normal function. The left ventricle has no regional wall motion abnormalities. There is mild  left ventricular hypertrophy. Left ventricular diastolic parameters are indeterminate.  2. Right ventricular systolic function is normal. The right ventricular size is normal. There is normal pulmonary artery systolic pressure.  3. Trivial mitral valve regurgitation.  4. The aortic valve is normal in structure. Aortic valve regurgitation is not visualized.  5. The inferior vena cava is normal in size with greater than 50% respiratory variability, suggesting right atrial pressure of 3 mmHg.     01/01/2022 - 01/22/2022 Chemotherapy   Patient is on Treatment Plan : UTERINE SEROUS CARCINOMA Carboplatin + Paclitaxel + Trastuzumab q21d x 6 Cycles / Trastuzumab q21d     03/29/2022 Pathology Results   A.   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   COMMENT:   The carcinoma has diffuse strong Pax8 immunoreactivity (Mullerian marker) and abnormal overexpression of nuclear p53 and relatively diffuse strong p16 staining.  Estrogen receptor (ER) is weakly reactive  and there is focal strong Napsin A immunoreactivity.  The immunophenotype is most  characteristic of serous carcinoma.   The history of prior treatment for endometrial serous carcinoma is noted.   04/12/2022 Imaging   1. Surgical change of prior hysterectomy with increased nodular thickening at the vaginal cuff, suspicious for recurrent disease. Consider further evaluation with PET-CT versus direct tissue sampling. 2. Unchanged prominent/mildly enlarged retroperitoneal and pelvic lymph nodes, nonspecific but in the setting of disease recurrence these would be at least somewhat suspicious for nodal involvement. Consider attention on follow-up imaging. 3. No evidence of metastatic disease within the chest. 4. Left-sided colonic diverticulosis without findings of acute diverticulitis. 5.  Aortic Atherosclerosis (ICD10-I70.0).   04/16/2022 -  Chemotherapy   Patient is on Treatment Plan : UTERINE Lenvatinib (20) D1-21 + Pembrolizumab (200) D1 q21d     07/09/2022 Imaging   Mild increase in mild left para-aortic and left iliac lymphadenopathy, consistent with metastatic lymphadenopathy.   Stable nodular thickening of vaginal cuff, consistent with vaginal recurrence.   Colonic diverticulosis, without radiographic evidence of diverticulitis.   Probable tiny calcified gallstone. No radiographic evidence of cholecystitis.   Aortic Atherosclerosis (ICD10-I70.0).       PHYSICAL EXAMINATION: ECOG PERFORMANCE STATUS: 1 - Symptomatic but completely ambulatory  Vitals:   07/11/22 1116  BP: 121/65  Pulse: 77  Resp: 18  Temp: 98.1 F (36.7 C)  SpO2: 99%   Filed Weights   07/11/22 1116  Weight: 207 lb (93.9 kg)    GENERAL:alert, no distress and comfortable NEURO: alert & oriented x 3 with fluent speech, no focal motor/sensory deficits  LABORATORY DATA:  I have reviewed the data as listed    Component Value Date/Time   NA 140 07/09/2022 1110   NA 143 10/18/2020 1028   K 3.5 07/09/2022 1110   CL 104 07/09/2022 1110   CO2 27 07/09/2022 1110   GLUCOSE 126 (H) 07/09/2022  1110   BUN 14 07/09/2022 1110   BUN 13 10/18/2020 1028   CREATININE 0.84 07/09/2022 1110   CREATININE 0.79 02/11/2017 0943   CALCIUM 9.5 07/09/2022 1110   PROT 7.2 07/09/2022 1110   PROT 6.7 10/18/2020 1028   ALBUMIN 3.7 07/09/2022 1110   ALBUMIN 4.1 10/18/2020 1028   AST 32 07/09/2022 1110   ALT 40 07/09/2022 1110   ALKPHOS 165 (H) 07/09/2022 1110   BILITOT 0.5 07/09/2022 1110   GFRNONAA >60 07/09/2022 1110   GFRNONAA 83 02/11/2017 0943  GFRAA 70 10/18/2020 1028   GFRAA >89 02/11/2017 0943    No results found for: "SPEP", "UPEP"  Lab Results  Component Value Date   WBC 5.3 07/09/2022   NEUTROABS 2.9 07/09/2022   HGB 11.3 (L) 07/09/2022   HCT 33.0 (L) 07/09/2022   MCV 88.2 07/09/2022   PLT 256 07/09/2022      Chemistry      Component Value Date/Time   NA 140 07/09/2022 1110   NA 143 10/18/2020 1028   K 3.5 07/09/2022 1110   CL 104 07/09/2022 1110   CO2 27 07/09/2022 1110   BUN 14 07/09/2022 1110   BUN 13 10/18/2020 1028   CREATININE 0.84 07/09/2022 1110   CREATININE 0.79 02/11/2017 0943      Component Value Date/Time   CALCIUM 9.5 07/09/2022 1110   ALKPHOS 165 (H) 07/09/2022 1110   AST 32 07/09/2022 1110   ALT 40 07/09/2022 1110   BILITOT 0.5 07/09/2022 1110       RADIOGRAPHIC STUDIES: I have reviewed multiple imaging studies with the patient and her daughter I have personally reviewed the radiological images as listed and agreed with the findings in the report. CT ABDOMEN PELVIS W CONTRAST  Result Date: 07/09/2022 CLINICAL DATA:  Follow-up metastatic endometrial carcinoma. Assess treatment response. * Tracking Code: BO * EXAM: CT ABDOMEN AND PELVIS WITH CONTRAST TECHNIQUE: Multidetector CT imaging of the abdomen and pelvis was performed using the standard protocol following bolus administration of intravenous contrast. RADIATION DOSE REDUCTION: This exam was performed according to the departmental dose-optimization program which includes automated exposure  control, adjustment of the mA and/or kV according to patient size and/or use of iterative reconstruction technique. CONTRAST:  169m OMNIPAQUE IOHEXOL 300 MG/ML  SOLN COMPARISON:  04/11/2022 FINDINGS: Lower Chest: No acute findings. Hepatobiliary: No hepatic masses identified. Probable tiny 2 mm calcified gallstone noted. No evidence of cholecystitis or biliary ductal dilatation. Pancreas:  No mass or inflammatory changes. Spleen: Within normal limits in size and appearance. Adrenals/Urinary Tract: No masses identified. No evidence of ureteral calculi or hydronephrosis. Stomach/Bowel: No evidence of obstruction, inflammatory process or abnormal fluid collections. Normal appendix visualized. Diverticulosis is seen mainly involving the descending and sigmoid colon, however there is no evidence of diverticulitis. Vascular/Lymphatic: Mild retroperitoneal lymphadenopathy in left para-aortic region shows mild increase since previous study, with index lymph node measuring 1.4 cm on image 42/2, compared to 10 mm previously. Mild lymphadenopathy again seen in the left common, internal, and external iliac chains. One left external iliac lymph node measures 13 mm on image 78/2, compared to 10 mm previously. Other iliac lymphadenopathy remains stable. Aortic atherosclerotic calcification incidentally noted. No acute vascular findings. Reproductive: Prior hysterectomy. Nodular thickening of the vaginal cuff is again seen, with largest area on the left measuring 3.4 x 3.1 cm, without significant change when remeasured on prior study. This is consistent with vaginal recurrence. No new or enlarging pelvic masses identified. No evidence of ascites. Other:  None. Musculoskeletal:  No suspicious bone lesions identified. IMPRESSION: Mild increase in mild left para-aortic and left iliac lymphadenopathy, consistent with metastatic lymphadenopathy. Stable nodular thickening of vaginal cuff, consistent with vaginal recurrence. Colonic  diverticulosis, without radiographic evidence of diverticulitis. Probable tiny calcified gallstone. No radiographic evidence of cholecystitis. Aortic Atherosclerosis (ICD10-I70.0). Electronically Signed   By: JMarlaine HindM.D.   On: 07/09/2022 15:38   ECHOCARDIOGRAM COMPLETE  Result Date: 06/28/2022    ECHOCARDIOGRAM REPORT   Patient Name:   Elizabeth COUSEDate of Exam:  06/28/2022 Medical Rec #:  [id number]     Height:       62.0 in Accession #:    [id number]    Weight:       200.8 lb Date of Birth:  07/29/1956    BSA:          1.915 m Patient Age:    66 years      BP:           112/77 mmHg Patient Gender: F             HR:           69 bpm. Exam Location:  [facility] Procedure: 2D Echo, Cardiac Doppler, Color Doppler, 3D Echo and Strain Analysis Indications:    Chemotherapy Management Z51.11  History:        Patient has prior history of Echocardiogram examinations, most                 recent 02/07/2022. CHF, CAD; Risk Factors:Hypertension, Diabetes                 and Dyslipidemia.  Sonographer:    Mikki Santee RDCS Referring Phys: [place]  1. Left ventricular ejection fraction, by estimation, is 60 to 65%. Left ventricular ejection fraction by 3D volume is 60 %. The left ventricle has normal function. The left ventricle has no regional wall motion abnormalities. There is mild left ventricular hypertrophy. Left ventricular diastolic parameters are consistent with Grade I diastolic dysfunction (impaired relaxation). The average left ventricular global longitudinal strain is -20.7 %. The global longitudinal strain is normal.  2. Right ventricular systolic function is normal. The right ventricular size is normal.  3. The mitral valve is normal in structure. Trivial mitral valve regurgitation. No evidence of mitral stenosis.  4. The aortic valve is tricuspid. Aortic valve regurgitation is not visualized. Aortic valve sclerosis is present, with no evidence of aortic valve stenosis.  FINDINGS  Left Ventricle: Left ventricular ejection fraction, by estimation, is 60 to 65%. Left ventricular ejection fraction by 3D volume is 60 %. The left ventricle has normal function. The left ventricle has no regional wall motion abnormalities. The average left ventricular global longitudinal strain is -20.7 %. The global longitudinal strain is normal. The left ventricular internal cavity size was small. There is mild left ventricular hypertrophy. Left ventricular diastolic parameters are consistent with Grade I diastolic dysfunction (impaired relaxation). Right Ventricle: The right ventricular size is normal. No increase in right ventricular wall thickness. Right ventricular systolic function is normal. Left Atrium: Left atrial size was normal in size. Right Atrium: Right atrial size was normal in size. Pericardium: Trivial pericardial effusion is present. Mitral Valve: The mitral valve is normal in structure. Trivial mitral valve regurgitation. No evidence of mitral valve stenosis. Tricuspid Valve: The tricuspid valve is normal in structure. Tricuspid valve regurgitation is trivial. Aortic Valve: The aortic valve is tricuspid. Aortic valve regurgitation is not visualized. Aortic valve sclerosis is present, with no evidence of aortic valve stenosis. Pulmonic Valve: The pulmonic valve was not well visualized. Pulmonic valve regurgitation is not visualized. Aorta: The aortic root and ascending aorta are structurally normal, with no evidence of dilitation. Venous: The inferior vena cava was not well visualized. IAS/Shunts: The interatrial septum was not well visualized.  LEFT VENTRICLE PLAX 2D LVIDd:         3.60 cm         Diastology LVIDs:  2.20 cm         LV e' medial:    3.81 cm/s LV PW:         1.20 cm         LV E/e' medial:  16.4 LV IVS:        1.20 cm         LV e' lateral:   5.98 cm/s LVOT diam:     2.20 cm         LV E/e' lateral: 10.4 LV SV:         89 LV SV Index:   46              2D LVOT  Area:     3.80 cm        Longitudinal                                Strain                                2D Strain GLS  -20.7 %                                Avg:                                 3D Volume EF                                LV 3D EF:    Left                                             ventricul                                             ar                                             ejection                                             fraction                                             by 3D                                             volume is  60 %.                                 3D Volume EF:                                3D EF:        60 %                                LV EDV:       118 ml                                LV ESV:       47 ml                                LV SV:        71 ml RIGHT VENTRICLE RV Basal diam:  2.80 cm RV S prime:     12.00 cm/s TAPSE (M-mode): 1.2 cm LEFT ATRIUM             Index        RIGHT ATRIUM           Index LA diam:        3.90 cm 2.04 cm/m   RA Area:     11.00 cm LA Vol (A2C):   26.1 ml 13.63 ml/m  RA Volume:   21.50 ml  11.23 ml/m LA Vol (A4C):   43.5 ml 22.72 ml/m LA Biplane Vol: 35.2 ml 18.38 ml/m  AORTIC VALVE LVOT Vmax:   104.00 cm/s LVOT Vmean:  68.100 cm/s LVOT VTI:    0.234 m  AORTA Ao Root diam: 2.80 cm Ao Asc diam:  3.20 cm MITRAL VALVE               TRICUSPID VALVE MV Area (PHT): 2.84 cm    TR Peak grad:   22.3 mmHg MV Decel Time: 267 msec    TR Vmax:        236.00 cm/s MV E velocity: 62.30 cm/s MV A velocity: 99.20 cm/s  SHUNTS MV E/A ratio:  0.63        Systemic VTI:  0.23 m                            Systemic Diam: 2.20 cm Oswaldo Milian MD Electronically signed by Oswaldo Milian MD Signature Date/Time: 06/28/2022/11:25:30 PM    Final

## 2022-07-11 NOTE — Assessment & Plan Note (Signed)
She has multifactorial anemia, component of anemia chronic illness and from prior treatment Observe closely 

## 2022-07-12 ENCOUNTER — Other Ambulatory Visit: Payer: Self-pay

## 2022-07-17 ENCOUNTER — Other Ambulatory Visit (HOSPITAL_COMMUNITY): Payer: Self-pay

## 2022-07-19 ENCOUNTER — Other Ambulatory Visit (HOSPITAL_COMMUNITY): Payer: Self-pay

## 2022-07-23 IMAGING — CT CT ABD-PELV W/ CM
2 of 5 series · 16 of 46 positions shown, 18 images · IV contrast (omnipaque)
Comparison: CT abdomen/pelvis dated 01/25/2021

CLINICAL DATA: Follow-up endometrial cancer

EXAM:
CT ABDOMEN AND PELVIS WITH CONTRAST
TECHNIQUE: Multidetector CT imaging of the abdomen and pelvis was performed
using the standard protocol following bolus administration of
intravenous contrast.
CONTRAST:  100mL OMNIPAQUE IOHEXOL 300 MG/ML  SOLN

[Series 2: axial st · axial · 0.98mm/px · z∈[+1176,+1616]mm · 13 of 100 slices shown, 15 images]
[im 6/100  soft-tissue]
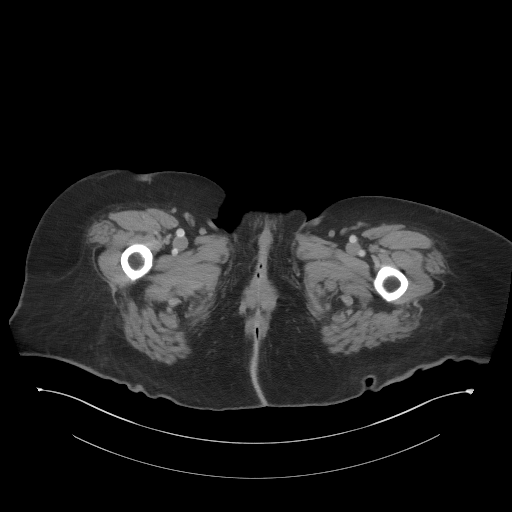
[im 6/100  bone]
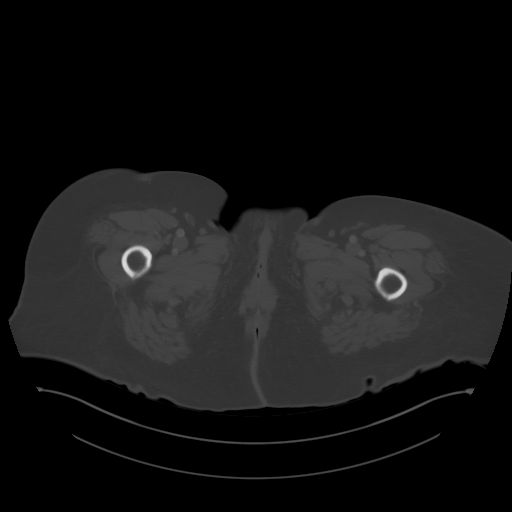
[im 12/100  soft-tissue]
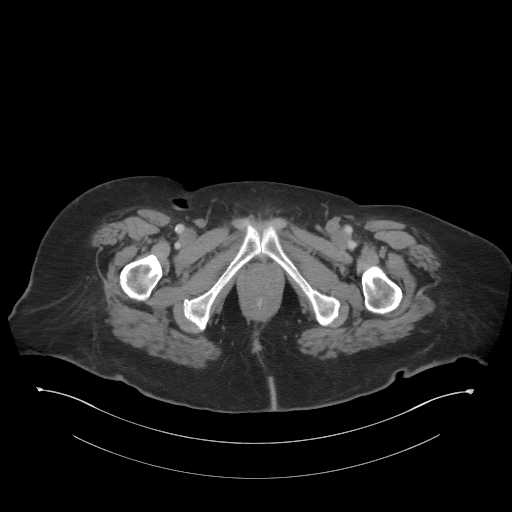
[im 24/100  soft-tissue]
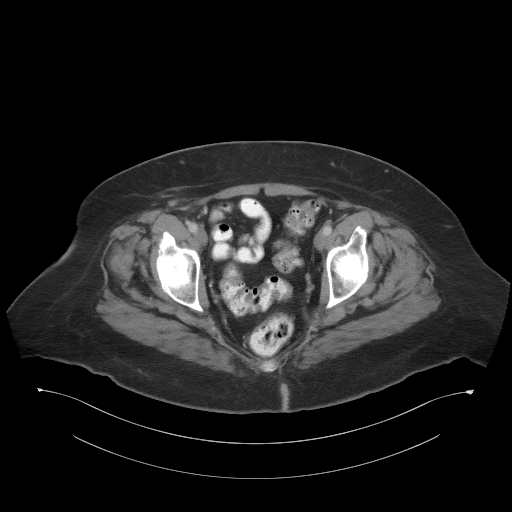
[im 30/100  soft-tissue]
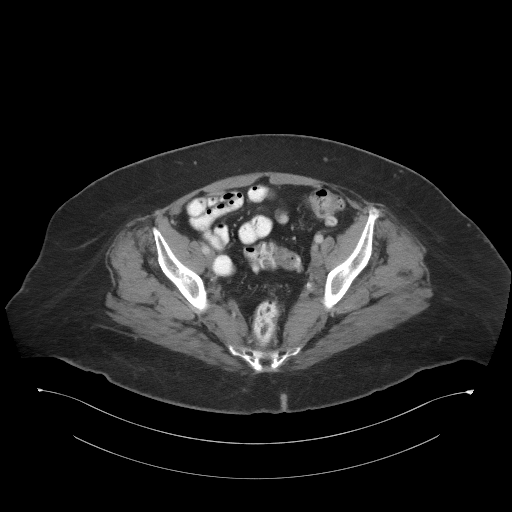
[im 35/100  soft-tissue]
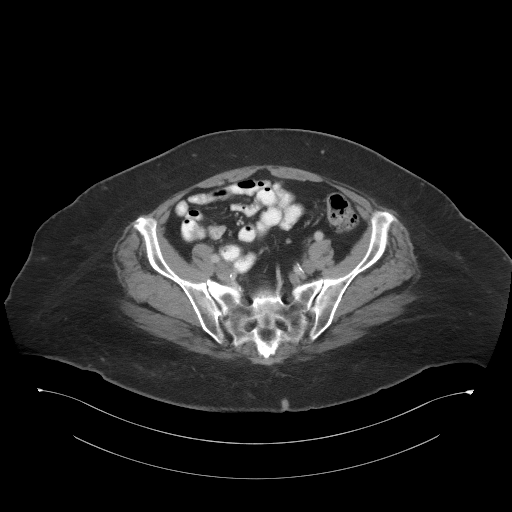
[im 41/100  soft-tissue]
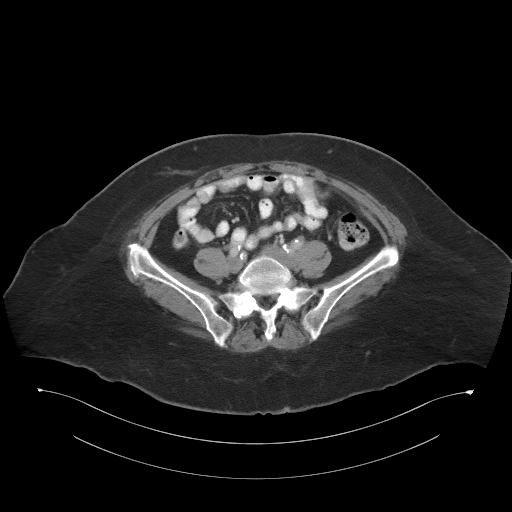
[im 53/100  soft-tissue]
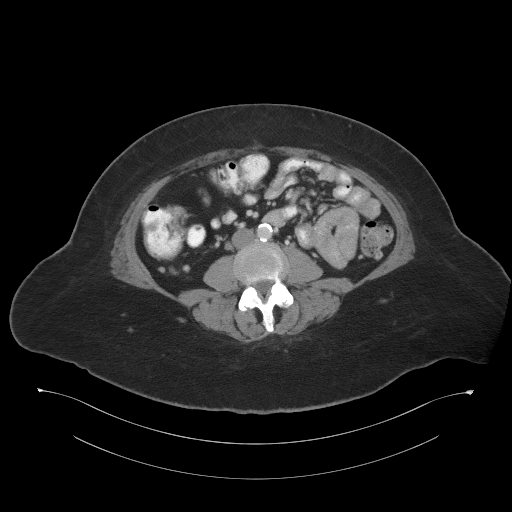
[im 59/100  soft-tissue]
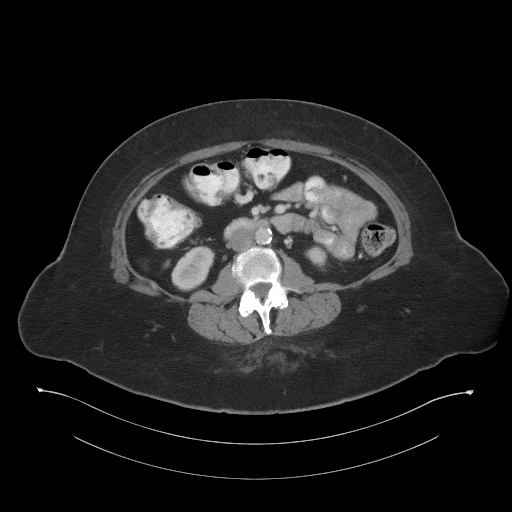
[im 65/100  soft-tissue]
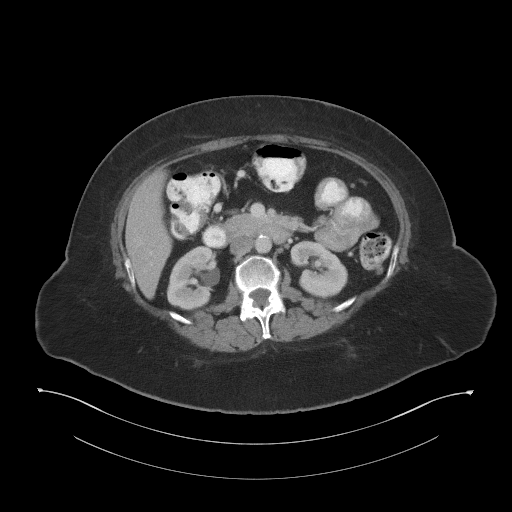
[im 65/100  bone]
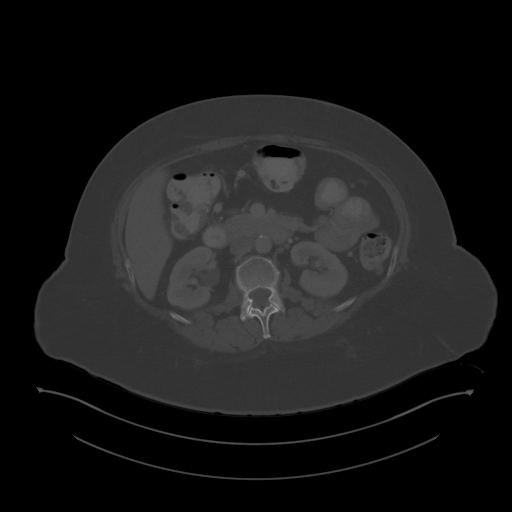
[im 70/100  soft-tissue]
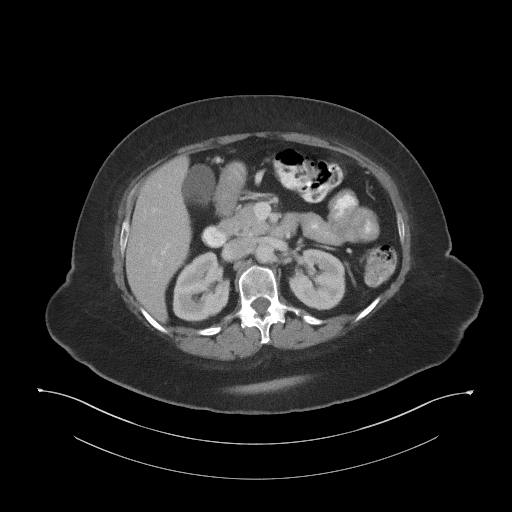
[im 76/100  soft-tissue]
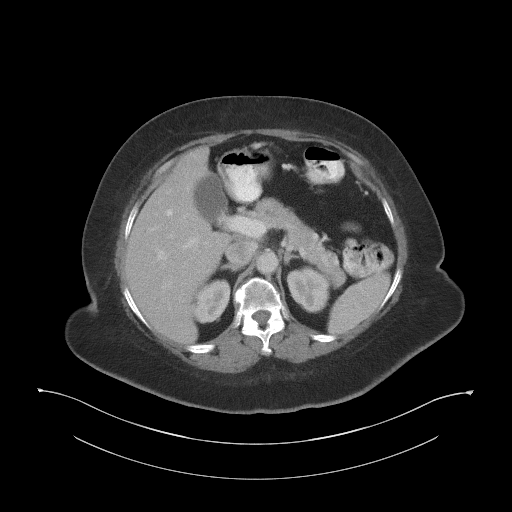
[im 88/100  soft-tissue]
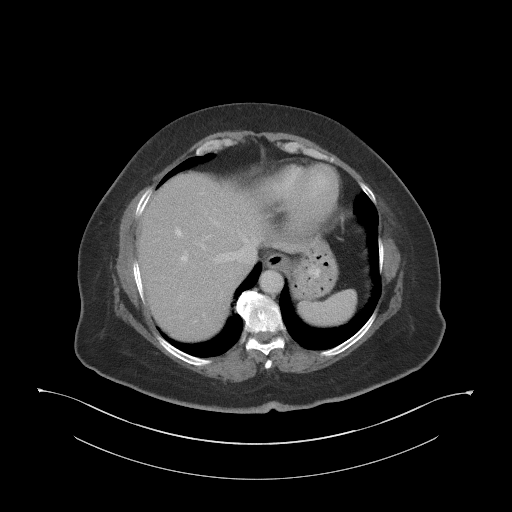
[im 94/100  soft-tissue]
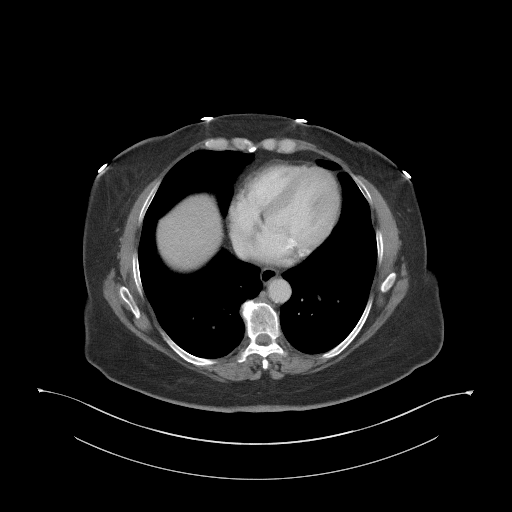

[Series 5: coronal st · coronal · 0.83mm/px · 3 of 119 slices shown]
[im 40/119  soft-tissue]
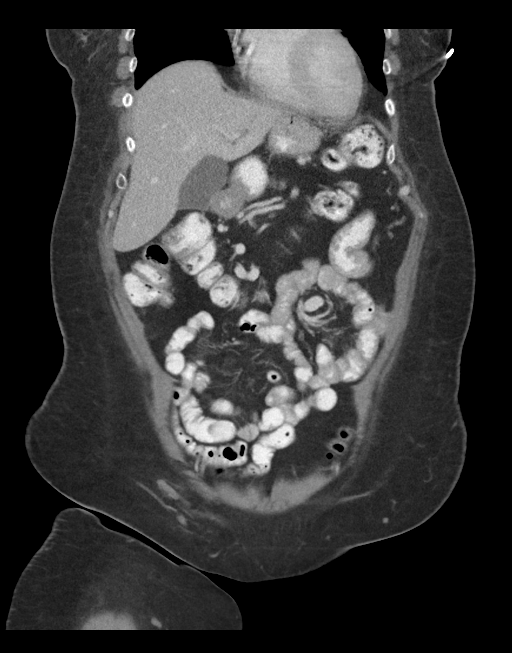
[im 53/119  soft-tissue]
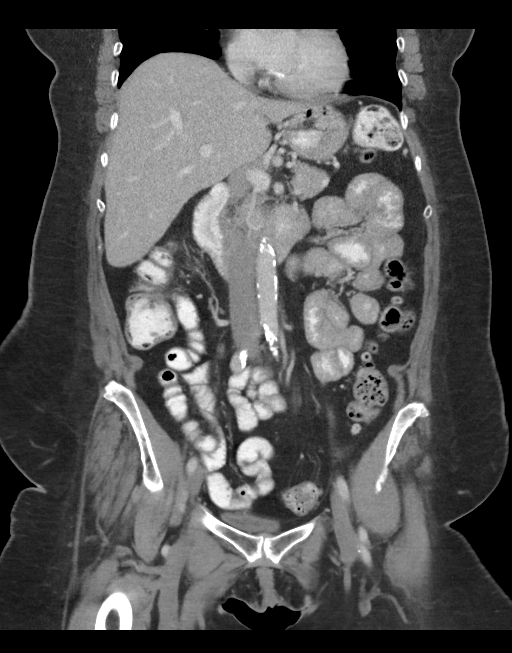
[im 66/119  soft-tissue]
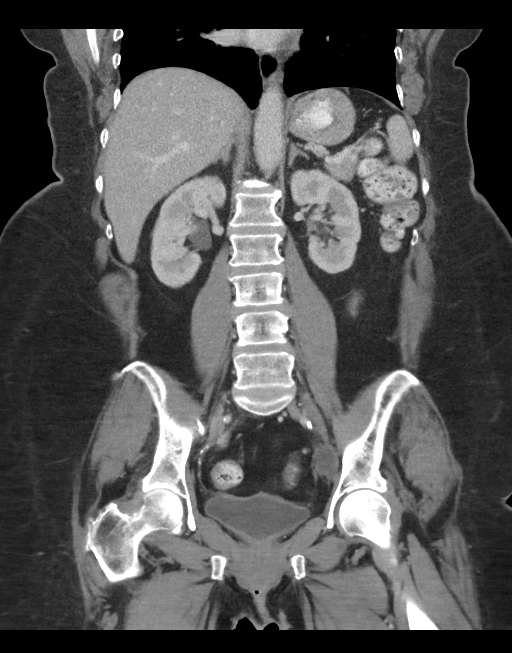

[16 of 46 positions shown; findings below may reference images not displayed]

FINDINGS: Lower chest: Lung bases are clear.

Hepatobiliary: Liver is within normal limits.

Gallbladder is unremarkable. No intrahepatic or extrahepatic ductal
dilatation.

Pancreas: Within normal limits.

Spleen: Central scarring with a 14 mm fluid density lesion (series
2/image 18), likely related to prior splenic infarct, unchanged and
benign.

Adrenals/Urinary Tract: Adrenal glands are within normal limits.

Kidneys are within normal limits.  No hydronephrosis.

Thick-walled bladder, although underdistended.

Stomach/Bowel: Stomach is within normal limits.

No evidence of bowel obstruction.

Normal appendix (series 2/image 47).

Mild left colonic diverticulosis, without evidence of
diverticulitis.

Vascular/Lymphatic: No evidence of abdominal aortic aneurysm.

Atherosclerotic calcifications of the abdominal aorta and branch
vessels.

No suspicious abdominopelvic lymphadenopathy.

2.2 cm fluid density lesion along the left pelvic sidewall (series
2/image 73) likely reflects a postoperative seroma.

Reproductive: Status post hysterectomy.

No adnexal masses.

Other: No abdominopelvic ascites.

No peritoneal nodularity or frank omental caking.

Musculoskeletal: Degenerative changes of the lower thoracic spine.
IMPRESSION: Status post hysterectomy and suspected bilateral
salpingo-oophorectomy.

2.2 cm fluid density lesion along the left pelvic sidewall likely
reflects a postoperative seroma.

No findings suspicious for recurrent or metastatic disease.

## 2022-07-24 ENCOUNTER — Encounter: Payer: Self-pay | Admitting: Physician Assistant

## 2022-07-31 ENCOUNTER — Other Ambulatory Visit: Payer: Self-pay

## 2022-08-02 ENCOUNTER — Other Ambulatory Visit: Payer: Self-pay | Admitting: Hematology and Oncology

## 2022-08-02 ENCOUNTER — Inpatient Hospital Stay: Payer: Medicaid Other

## 2022-08-02 ENCOUNTER — Other Ambulatory Visit: Payer: Self-pay

## 2022-08-02 ENCOUNTER — Encounter: Payer: Self-pay | Admitting: Hematology and Oncology

## 2022-08-02 ENCOUNTER — Inpatient Hospital Stay (HOSPITAL_BASED_OUTPATIENT_CLINIC_OR_DEPARTMENT_OTHER): Payer: Medicaid Other | Admitting: Hematology and Oncology

## 2022-08-02 ENCOUNTER — Inpatient Hospital Stay: Payer: Medicaid Other | Attending: Gynecologic Oncology

## 2022-08-02 VITALS — BP 132/66 | HR 64 | Temp 97.6°F | Resp 18

## 2022-08-02 DIAGNOSIS — I959 Hypotension, unspecified: Secondary | ICD-10-CM | POA: Insufficient documentation

## 2022-08-02 DIAGNOSIS — C541 Malignant neoplasm of endometrium: Secondary | ICD-10-CM | POA: Insufficient documentation

## 2022-08-02 DIAGNOSIS — Z79899 Other long term (current) drug therapy: Secondary | ICD-10-CM | POA: Insufficient documentation

## 2022-08-02 DIAGNOSIS — N1831 Chronic kidney disease, stage 3a: Secondary | ICD-10-CM

## 2022-08-02 DIAGNOSIS — Z90710 Acquired absence of both cervix and uterus: Secondary | ICD-10-CM | POA: Diagnosis not present

## 2022-08-02 DIAGNOSIS — I129 Hypertensive chronic kidney disease with stage 1 through stage 4 chronic kidney disease, or unspecified chronic kidney disease: Secondary | ICD-10-CM | POA: Diagnosis not present

## 2022-08-02 DIAGNOSIS — Z5112 Encounter for antineoplastic immunotherapy: Secondary | ICD-10-CM | POA: Diagnosis present

## 2022-08-02 DIAGNOSIS — I10 Essential (primary) hypertension: Secondary | ICD-10-CM

## 2022-08-02 LAB — CBC WITH DIFFERENTIAL (CANCER CENTER ONLY)
Abs Immature Granulocytes: 0.01 10*3/uL (ref 0.00–0.07)
Basophils Absolute: 0 10*3/uL (ref 0.0–0.1)
Basophils Relative: 0 %
Eosinophils Absolute: 0 10*3/uL (ref 0.0–0.5)
Eosinophils Relative: 1 %
HCT: 30.9 % — ABNORMAL LOW (ref 36.0–46.0)
Hemoglobin: 10.6 g/dL — ABNORMAL LOW (ref 12.0–15.0)
Immature Granulocytes: 0 %
Lymphocytes Relative: 32 %
Lymphs Abs: 1.4 10*3/uL (ref 0.7–4.0)
MCH: 30.9 pg (ref 26.0–34.0)
MCHC: 34.3 g/dL (ref 30.0–36.0)
MCV: 90.1 fL (ref 80.0–100.0)
Monocytes Absolute: 0.5 10*3/uL (ref 0.1–1.0)
Monocytes Relative: 10 %
Neutro Abs: 2.5 10*3/uL (ref 1.7–7.7)
Neutrophils Relative %: 57 %
Platelet Count: 261 10*3/uL (ref 150–400)
RBC: 3.43 MIL/uL — ABNORMAL LOW (ref 3.87–5.11)
RDW: 14.8 % (ref 11.5–15.5)
WBC Count: 4.5 10*3/uL (ref 4.0–10.5)
nRBC: 0 % (ref 0.0–0.2)

## 2022-08-02 LAB — CMP (CANCER CENTER ONLY)
ALT: 32 U/L (ref 0–44)
AST: 31 U/L (ref 15–41)
Albumin: 3.2 g/dL — ABNORMAL LOW (ref 3.5–5.0)
Alkaline Phosphatase: 116 U/L (ref 38–126)
Anion gap: 9 (ref 5–15)
BUN: 21 mg/dL (ref 8–23)
CO2: 25 mmol/L (ref 22–32)
Calcium: 9.6 mg/dL (ref 8.9–10.3)
Chloride: 107 mmol/L (ref 98–111)
Creatinine: 1.26 mg/dL — ABNORMAL HIGH (ref 0.44–1.00)
GFR, Estimated: 48 mL/min — ABNORMAL LOW (ref >60)
Glucose, Bld: 172 mg/dL — ABNORMAL HIGH (ref 70–99)
Potassium: 3.9 mmol/L (ref 3.5–5.1)
Sodium: 141 mmol/L (ref 135–145)
Total Bilirubin: 0.4 mg/dL (ref 0.3–1.2)
Total Protein: 7.3 g/dL (ref 6.5–8.1)

## 2022-08-02 LAB — TSH: TSH: 3.894 u[IU]/mL (ref 0.350–4.500)

## 2022-08-02 MED ORDER — SODIUM CHLORIDE 0.9 % IV SOLN: Freq: Once | INTRAVENOUS | Status: AC

## 2022-08-02 MED ORDER — ALTEPLASE 2 MG IJ SOLR: 2.0000 mg | Freq: Once | INTRAMUSCULAR | Status: DC | PRN

## 2022-08-02 MED ORDER — HEPARIN SOD (PORK) LOCK FLUSH 100 UNIT/ML IV SOLN: 250.0000 [IU] | Freq: Once | INTRAVENOUS | Status: DC | PRN

## 2022-08-02 MED ORDER — SODIUM CHLORIDE 0.9% FLUSH: 3.0000 mL | Freq: Once | INTRAVENOUS | Status: DC | PRN

## 2022-08-02 MED ORDER — SODIUM CHLORIDE 0.9% FLUSH
10.0000 mL | Freq: Once | INTRAVENOUS | Status: AC | PRN
  Administered 2022-08-02: 10 mL

## 2022-08-02 MED ORDER — HEPARIN SOD (PORK) LOCK FLUSH 100 UNIT/ML IV SOLN
500.0000 [IU] | Freq: Once | INTRAVENOUS | Status: AC | PRN
  Administered 2022-08-02: 500 [IU]

## 2022-08-02 MED ORDER — SODIUM CHLORIDE 0.9% FLUSH
10.0000 mL | Freq: Once | INTRAVENOUS | Status: AC
  Administered 2022-08-02: 10 mL

## 2022-08-02 NOTE — Assessment & Plan Note (Signed)
She has chronic renal failure She is not drinking enough fluids and has significant hypotension today With aggressive IV fluid resuscitation, her vital signs improved Her medications were adjusted as above She will return next week for further evaluation

## 2022-08-02 NOTE — Progress Notes (Signed)
Per Alvy Bimler MD, no TX today. Pt to receive 1L NS over 2 hours.

## 2022-08-02 NOTE — Progress Notes (Signed)
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[facility]) She is weak and in signs of acute renal failure and severe hypotension Her treatment was canceled and she improved with IV fluid resuscitation The plan of care is fully discussed with her daughter I will put Cardura, amlodipine, lenvatinib, furosemide and lisinopril on hold, pending further evaluation next week  Chronic kidney disease, stage 3a ([place]) She has chronic renal failure She is not drinking enough fluids and has significant hypotension today With aggressive IV fluid resuscitation, her vital signs improved Her medications were adjusted as above She will return next week for further evaluation  Essential hypertension She had history of uncontrolled hypertension on lenvatinib However, her blood pressure is extremely low, 87/57, improved back to normal after 1 L of fluids As above, several of her blood pressure medications were placed on hold I spent a lot of time discussing the plan of care with her daughter She will make sure her mother drinks enough fluid and to monitor her blood pressure closely at home  No orders of the defined types were placed in this encounter.   All questions were answered. The patient knows to call the clinic with any problems, questions or concerns. The total time spent in the appointment was 40 minutes encounter with patients including review of chart and various tests results, discussions about plan of care and coordination of care plan   Heath Lark, MD 08/02/2022 6:18 PM  INTERVAL HISTORY: Please see below for problem oriented charting. she returns for treatment follow-up by herself I spoke with her daughter twice She came in profoundly weak and dizzy She did not bring documentation of her blood  sugar or blood pressure from home According to the patient, all her numbers were normal She did not eat much this morning and only drinking a can of Ensure prior to coming here  REVIEW OF SYSTEMS:   Constitutional: Denies fevers, chills or abnormal weight loss Eyes: Denies blurriness of vision Ears, nose, mouth, throat, and face: Denies mucositis or sore throat Respiratory: Denies cough, dyspnea or wheezes Cardiovascular: Denies palpitation, chest discomfort or lower extremity swelling Gastrointestinal:  Denies nausea, heartburn or change in bowel habits Skin: Denies abnormal skin rashes Behavioral/Psych: Mood is stable, no new changes  All other systems were reviewed with the patient and are negative.  I have reviewed the past medical history, past surgical history, social history and family history with the patient and they are unchanged from previous note.  ALLERGIES:  has No Known Allergies.  MEDICATIONS:  Current Outpatient Medications  Medication Sig Dispense Refill   aspirin 81 MG EC tablet Take 1 tablet (81 mg total) by mouth daily. 30 tablet 12   atorvastatin (LIPITOR) 80 MG tablet TAKE 1 TABLET (80 MG TOTAL) BY MOUTH AT BEDTIME. 30 tablet 6   cholestyramine light (PREVALITE) 4 g packet Dissolve in water as directed then take 1 packet by mouth daily at 12 noon. 30 each 0   Dulaglutide (TRULICITY) 1.5 ZY/6.0YT SOPN Inject 1.5 mg into the skin once a week. 2 mL 6   glimepiride (AMARYL) 4 MG tablet Take 1 tablet (4 mg total) by mouth daily with breakfast. 30 tablet 6   isosorbide mononitrate (IMDUR) 30 MG 24 hr tablet Take 2 tablets (60 mg total) by mouth daily. 60 tablet 6  metFORMIN (GLUCOPHAGE) 500 MG tablet Take 2 tablets (1,000 mg total) by mouth 2 (two) times daily with a meal. 120 tablet 6   metoprolol tartrate (LOPRESSOR) 100 MG tablet TAKE 1 TABLET (100 MG TOTAL) BY MOUTH 2 (TWO) TIMES DAILY. 60 tablet 6   nitroGLYCERIN (NITROSTAT) 0.4 MG SL tablet Place 1 tablet (0.4 mg  total) under the tongue every 5 (five) minutes as needed for chest pain. 25 tablet 2   No current facility-administered medications for this visit.   Facility-Administered Medications Ordered in Other Visits  Medication Dose Route Frequency Provider Last Rate Last Admin   alteplase (CATHFLO ACTIVASE) injection 2 mg  2 mg Intracatheter Once PRN Alvy Bimler, Izola Teague, MD       heparin lock flush 100 unit/mL  250 Units Intracatheter Once PRN Alvy Bimler, Mieshia Pepitone, MD       sodium chloride flush (NS) 0.9 % injection 3 mL  3 mL Intracatheter Once PRN Heath Lark, MD        SUMMARY OF ONCOLOGIC HISTORY: Oncology History Overview Note  Pap 12/14/20: adenocarcinoma, HPV negative  HER-2 positive by FISH Her-2 equivocal by [facility]  MSI stable    Papillary serous adenocarcinoma of endometrium ([place])   Initial Diagnosis   Endometrial carcinoma ([place])   01/12/2021 Initial Biopsy   A. ENDOCERVIX, CURETTAGE:  - Adenocarcinoma.  B. ENDOMETRIUM, BIOPSY:  - Adenocarcinoma.  COMMENT:  The differential diagnosis includes high grade serous carcinoma.  Both  specimens have a similar morphology; high grade serous carcinoma more  commonly arises in the endometrium.  Results reported to Dr. Hale Bogus  on 01/15/2021.  Dr. Saralyn Pilar reviewed the case.    01/16/2021 Tumor Marker   Patient's tumor was tested for the following markers: CA-125 Results of the tumor marker test revealed normal value, 10.7   01/25/2021 Imaging   CT C/A/P: 1. Small volume fluid in the endometrial canal with 7 mm hypoattenuating lesion in the myometrium of the anterior fundus. 2. No definite evidence for metastatic disease in the chest, abdomen, or pelvis. Small lymph nodes are noted along both pelvic sidewalls. Attention on follow-up recommended. 3. 5 mm ground-glass opacity peripheral left upper lobe. This is likely related to infection/inflammation and potentially scar. Attention on surveillance imaging recommended. 4. 5.8 cm lesion posterior  lower uterine segment likely a fibroid. Ultrasound exam from 01/28/2010 demonstrated a 5.2 cm lesion in the left aspect of the posterior lower uterine body. 5. Aortic Atherosclerosis (ICD10-I70.0).   01/29/2021 Surgery   TRH/BSO, SLN biopsy left, selective pelvic LND right, omentectomy  Findings: On EUA, 10-12cm enlarged mobile uterus. On intra-abdomina entry, minimal adhesions between the liver and anterior abdomen on the right. Some changes c/w fatty liver on the left. Omentum, stomach, small and large bowel all grossly normal. Bilateral ovaries normal appearing. Uterus with 5-6cm posterior fibroid, otherwise normal appearing. No mapping to right pelvis. On left, mapping to the level of the superior vessel artery, enlarged lymph node noted (likely sentinel) within the upper aspect of the obturator space. In bilateral pelvic basins, multiple enlarged lymph nodes. On the right, enlarged lymph nodes extended superiorly along the common iliac vessels. At the end of surgery, no obvious abdominal or pelvic evidence of disease.   01/29/2021 Pathology Results   A. SENTINEL LYMPH NODE, LEFT INTERNAL ILIAC, BIOPSY:  - Metastatic carcinoma in (1) of (1) lymph node.   B. SENTINEL LYMPH NODE, LEFT OBTURATOR, BIOPSY:  - Metastatic carcinoma in (1) of (1) lymph node.   C. LYMPH NODES, LEFT  PELVIC, DISSECTION:  - Metastatic carcinoma in (1) of (3) lymph nodes.   D. LYMPH NODE, RIGHT EXTERNAL AND COMMON ILIAC, BIOPSY:  - Metastatic carcinoma in (1) of (1) lymph node.   E. UTERUS, CERVIX AND BILATERAL FALLOPIAN TUBES AND OVARIES, TOTAL  HYSTERECTOMY AND BILATERAL SALPINGO-OOPHORECTOMY:  - High grade serous carcinoma of endometrium, with invasion more than  half of the myometrium.  - Tumor invades the stromal connective tissue of the cervix.  - No involvement of uterine serosa or adnexa.  - Lymphovascular invasion is identified.  - See oncology table.   F. OMENTUM, OMENTECTOMY:  - Omentum, negative  for carcinoma.   G. LYMPH NODES, RIGHT PELVIC, DISSECTION:  - Two lymph nodes, negative for carcinoma (0/2).   ONCOLOGY TABLE:   UTERUS, CARCINOMA OR CARCINOSARCOMA: Resection   Procedure: Total hysterectomy and bilateral salpingo-oophorectomy,  Omentectomy, Lymph node sampling  Histologic Type: Serous carcinoma  Histologic Grade: High grade  Myometrial Invasion: > 50%  Uterine Serosa Involvement: Not identified  Cervical Stroma Involvement: Present  Other Tissue/Organ Involvement: Not identified  Peritoneal/Ascitic Fluid: Negative for carcinoma  Lymphovascular Invasion: Present  Regional Lymph Nodes:       Pelvic Lymph Nodes Examined:            2 Sentinel            6 Non-Sentinel            8 Total       Pelvic Lymph Nodes with Metastasis: 4            Macrometastasis: 3            Micrometastasis: 1            Isolated Tumor Cells: 0            Laterality of Lymph Nodes with Tumor: Right (non-sentinel),  Left (sentinel and non-sentinel)            Extracapsular Extension: Present       Para-Aortic Lymph Nodes Examined: Not applicable        Para-Aortic Lymph Nodes with Metastasis: Not applicable  Distant Metastasis:       Distant Site(s) Involved: Omentum: Not involved  Pathologic Stage Classification (pTNM, AJCC 8th Edition): pT2, pN1a  Ancillary Studies: HER2 will be ordered  Additional Findings: Leiomyomata.  Adenomyosis.  Representative Tumor Block: B1  Comment: Dr. Saralyn Pilar reviewed select slides.  (v4.2.0.1)    01/29/2021 Cancer Staging   Staging form: Corpus Uteri - Carcinoma and Carcinosarcoma, AJCC 8th Edition - Clinical stage from 01/29/2021: FIGO Stage IIIC1 (cT1b, cN1a, cM0) - Signed by Lafonda Mosses, MD on 02/02/2021 Histopathologic type: Mixed cell adenocarcinoma Stage prefix: Initial diagnosis Method of lymph node assessment: Other Histologic grade (G): G3 Histologic grading system: 3 grade system Lymph-vascular invasion (LVI): LVI  present/identified, NOS Peritoneal cytology results: Negative Pelvic nodal status: Positive Number of pelvic nodes positive from dissection: 4 Number of pelvic nodes examined during dissection: 8 Para-aortic status: Not assessed Lymph node metastasis: Present Omentectomy performed: Yes Morcellation performed: No   02/19/2021 Echocardiogram    1. Left ventricular ejection fraction, by estimation, is 55 to 60%. The left ventricle has normal function. The left ventricle demonstrates regional wall motion abnormalities (see scoring diagram/findings for description). There is mild left ventricular  hypertrophy. Left ventricular diastolic parameters are consistent with Grade I diastolic dysfunction (impaired relaxation). There is moderate hypokinesis of the left ventricular, basal septal wall and inferior wall. The average left ventricular global  longitudinal strain is -17.9 %. The global longitudinal strain is abnormal.  2. Right ventricular systolic function is normal. The right ventricular size is normal. There is normal pulmonary artery systolic pressure. The estimated right ventricular systolic pressure is 78.4 mmHg.  3. The mitral valve is abnormal. Trivial mitral valve regurgitation.  4. The aortic valve is tricuspid. Aortic valve regurgitation is not visualized.  5. The inferior vena cava is normal in size with greater than 50% respiratory variability, suggesting right atrial pressure of 3 mmHg.   02/20/2021 Procedure   Successful placement of a right IJ approach Power Port with ultrasound and fluoroscopic guidance. The catheter is ready for use.   02/28/2021 - 06/14/2021 Chemotherapy    Patient is on Treatment Plan: UTERINE CARBOPLATIN AUC 6 / PACLITAXEL Q21D       04/20/2021 Imaging   Status post hysterectomy and suspected bilateral salpingo-oophorectomy.   2.2 cm fluid density lesion along the left pelvic sidewall likely reflects a postoperative seroma.   No findings suspicious for  recurrent or metastatic disease.   07/16/2021 - 08/16/2021 Radiation Therapy   Radiation Treatment Dates: 07/16/2021 through 08/16/2021 Site Technique Total Dose (Gy) Dose per Fx (Gy) Completed Fx Beam Energies  Vagina: Pelvis HDR-brachy 30/30 6 5/5 Ir-192        10/01/2021 Imaging   Increasing size of LEFT pelvic lymph nodes, largest approximately 11 mm along the external iliac chain. Given findings and history could consider PET for further evaluation as warranted.   Cystic area along the LEFT pelvic sidewall that was seen previously has improved.   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   Aortic Atherosclerosis (ICD10-I70.0).   12/21/2021 Imaging   1. Enlarging soft tissue masses involving the vaginal cuff. 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 Speculum examination and re-biopsy may be confirmatory. PET-CT may be helpful. 2. New small sub 5 mm perirectal and sigmoid mesocolon nodes. 3. Slight progression of pelvic adenopathy as detailed above. 4. No findings suspicious for abdominal metastatic disease or osseous metastatic disease. 5. Stable age advanced atherosclerotic calcifications involving the aorta and branch vessels and coronary arteries   12/25/2021 Echocardiogram    1. Global longitudinal strain is -16.1% Mild basal inferior hypoinesis. Compared to echo from 02/2021, no change in overall LVEF /regional wall motion. Global longitudinal strain is mildly less negative (-17.9% to -16.1%. Left ventricular ejection fraction, by estimation, is 65 to 70%. The left ventricle has normal function. The left ventricle has no regional wall motion abnormalities. There is mild left ventricular hypertrophy. Left ventricular diastolic parameters are indeterminate.  2. Right ventricular systolic function is normal. The right ventricular size is normal. There is normal pulmonary  artery systolic pressure.  3. Trivial mitral valve regurgitation.  4. The aortic valve is normal in structure. Aortic valve regurgitation is not visualized.  5. The inferior vena cava is normal in size with greater than 50% respiratory variability, suggesting right atrial pressure of 3 mmHg.     01/01/2022 - 01/22/2022 Chemotherapy   Patient is on Treatment Plan : UTERINE SEROUS CARCINOMA Carboplatin + Paclitaxel + Trastuzumab q21d x 6 Cycles / Trastuzumab q21d     03/29/2022 Pathology Results   A.   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   COMMENT:   The carcinoma has diffuse  strong Pax8 immunoreactivity (Mullerian marker) and abnormal overexpression of nuclear p53 and relatively diffuse strong p16 staining.  Estrogen receptor (ER) is weakly reactive  and there is focal strong Napsin A immunoreactivity.  The immunophenotype is most characteristic of serous carcinoma.   The history of prior treatment for endometrial serous carcinoma is noted.   04/12/2022 Imaging   1. Surgical change of prior hysterectomy with increased nodular thickening at the vaginal cuff, suspicious for recurrent disease. Consider further evaluation with PET-CT versus direct tissue sampling. 2. Unchanged prominent/mildly enlarged retroperitoneal and pelvic lymph nodes, nonspecific but in the setting of disease recurrence these would be at least somewhat suspicious for nodal involvement. Consider attention on follow-up imaging. 3. No evidence of metastatic disease within the chest. 4. Left-sided colonic diverticulosis without findings of acute diverticulitis. 5.  Aortic Atherosclerosis (ICD10-I70.0).   04/16/2022 -  Chemotherapy   Patient is on Treatment Plan : UTERINE Lenvatinib (20) D1-21 + Pembrolizumab (200) D1 q21d     07/09/2022 Imaging   Mild increase in mild left para-aortic and  left iliac lymphadenopathy, consistent with metastatic lymphadenopathy.   Stable nodular thickening of vaginal cuff, consistent with vaginal recurrence.   Colonic diverticulosis, without radiographic evidence of diverticulitis.   Probable tiny calcified gallstone. No radiographic evidence of cholecystitis.   Aortic Atherosclerosis (ICD10-I70.0).       PHYSICAL EXAMINATION: ECOG PERFORMANCE STATUS: 2 - Symptomatic, <50% confined to bed  Vitals:   08/02/22 1328  BP: (!) 87/57  Pulse: 70  Resp: 18  Temp: 97.7 F (36.5 C)  SpO2: 100%   Filed Weights   08/02/22 1328  Weight: 205 lb 3.2 oz (93.1 kg)    GENERAL:alert, no distress and comfortable.  She appears weak and lethargic NEURO: alert & oriented x 3 with fluent speech, no focal motor/sensory deficits  LABORATORY DATA:  I have reviewed the data as listed    Component Value Date/Time   NA 141 08/02/2022 1235   NA 143 10/18/2020 1028   K 3.9 08/02/2022 1235   CL 107 08/02/2022 1235   CO2 25 08/02/2022 1235   GLUCOSE 172 (H) 08/02/2022 1235   BUN 21 08/02/2022 1235   BUN 13 10/18/2020 1028   CREATININE 1.26 (H) 08/02/2022 1235   CREATININE 0.79 02/11/2017 0943   CALCIUM 9.6 08/02/2022 1235   PROT 7.3 08/02/2022 1235   PROT 6.7 10/18/2020 1028   ALBUMIN 3.2 (L) 08/02/2022 1235   ALBUMIN 4.1 10/18/2020 1028   AST 31 08/02/2022 1235   ALT 32 08/02/2022 1235   ALKPHOS 116 08/02/2022 1235   BILITOT 0.4 08/02/2022 1235   GFRNONAA 48 (L) 08/02/2022 1235   GFRNONAA 83 02/11/2017 0943   GFRAA 70 10/18/2020 1028   GFRAA >89 02/11/2017 0943    No results found for: "SPEP", "UPEP"  Lab Results  Component Value Date   WBC 4.5 08/02/2022   NEUTROABS 2.5 08/02/2022   HGB 10.6 (L) 08/02/2022   HCT 30.9 (L) 08/02/2022   MCV 90.1 08/02/2022   PLT 261 08/02/2022      Chemistry      Component Value Date/Time   NA 141 08/02/2022 1235   NA 143 10/18/2020 1028   K 3.9 08/02/2022 1235   CL 107 08/02/2022 1235   CO2  25 08/02/2022 1235   BUN 21 08/02/2022 1235   BUN 13 10/18/2020 1028   CREATININE 1.26 (H) 08/02/2022 1235   CREATININE 0.79 02/11/2017 0943      Component Value Date/Time   CALCIUM  9.6 08/02/2022 1235   ALKPHOS 116 08/02/2022 1235   AST 31 08/02/2022 1235   ALT 32 08/02/2022 1235   BILITOT 0.4 08/02/2022 1235

## 2022-08-02 NOTE — Assessment & Plan Note (Signed)
She is weak and in signs of acute renal failure and severe hypotension Her treatment was canceled and she improved with IV fluid resuscitation The plan of care is fully discussed with her daughter I will put Cardura, amlodipine, lenvatinib, furosemide and lisinopril on hold, pending further evaluation next week

## 2022-08-02 NOTE — Assessment & Plan Note (Signed)
She had history of uncontrolled hypertension on lenvatinib However, her blood pressure is extremely low, 87/57, improved back to normal after 1 L of fluids As above, several of her blood pressure medications were placed on hold I spent a lot of time discussing the plan of care with her daughter She will make sure her mother drinks enough fluid and to monitor her blood pressure closely at home

## 2022-08-02 NOTE — Patient Instructions (Signed)
Rehydration, Adult Rehydration is the replacement of body fluids, salts, and minerals (electrolytes) that are lost during dehydration. Dehydration is when there is not enough water or other fluids in the body. This happens when you lose more fluids than you take in. Common causes of dehydration include: Not drinking enough fluids. This can occur when you are ill or doing activities that require a lot of energy, especially in hot weather. Conditions that cause loss of water or other fluids, such as diarrhea, vomiting, sweating, or urinating a lot. Other illnesses, such as fever or infection. Certain medicines, such as those that remove excess fluid from the body (diuretics). Symptoms of mild or moderate dehydration may include thirst, dry lips and mouth, and dizziness. Symptoms of severe dehydration may include increased heart rate, confusion, fainting, and not urinating. For severe dehydration, you may need to get fluids through an IV at the hospital. For mild or moderate dehydration, you can usually rehydrate at home by drinking certain fluids as told by your health care provider. What are the risks? Generally, rehydration is safe. However, taking in too much fluid (overhydration) can be a problem. This is rare. Overhydration can cause an electrolyte imbalance, kidney failure, or a decrease in salt (sodium) levels in the body. Supplies needed You will need an oral rehydration solution (ORS) if your health care provider tells you to use one. This is a drink to treat dehydration. It can be found in pharmacies and retail stores. How to rehydrate Fluids Follow instructions from your health care provider for rehydration. The kind of fluid and the amount you should drink depend on your condition. In general, you should choose drinks that you prefer. If told by your health care provider, drink an ORS. Make an ORS by following instructions on the package. Start by drinking small amounts, about  cup (120  mL) every 5-10 minutes. Slowly increase how much you drink until you have taken the amount recommended by your health care provider. Drink enough clear fluids to keep your urine pale yellow. If you were told to drink an ORS, finish it first, then start slowly drinking other clear fluids. Drink fluids such as: Water. This includes sparkling water and flavored water. Drinking only water can lead to having too little sodium in your body (hyponatremia). Follow the advice of your health care provider. Water from ice chips you suck on. Fruit juice with water you add to it (diluted). Sports drinks. Hot or cold herbal teas. Broth-based soups. Milk or milk products. Food Follow instructions from your health care provider about what to eat while you rehydrate. Your health care provider may recommend that you slowly begin eating regular foods in small amounts. Eat foods that contain a healthy balance of electrolytes, such as bananas, oranges, potatoes, tomatoes, and spinach. Avoid foods that are greasy or contain a lot of sugar. In some cases, you may get nutrition through a feeding tube that is passed through your nose and into your stomach (nasogastric tube, or NG tube). This may be done if you have uncontrolled vomiting or diarrhea. Beverages to avoid  Certain beverages may make dehydration worse. While you rehydrate, avoid drinking alcohol. How to tell if you are recovering from dehydration You may be recovering from dehydration if: You are urinating more often than before you started rehydrating. Your urine is pale yellow. Your energy level improves. You vomit less frequently. You have diarrhea less frequently. Your appetite improves or returns to normal. You feel less dizzy or less light-headed.   Your skin tone and color start to look more normal. Follow these instructions at home: Take over-the-counter and prescription medicines only as told by your health care provider. Do not take sodium  tablets. Doing this can lead to having too much sodium in your body (hypernatremia). Contact a health care provider if: You continue to have symptoms of mild or moderate dehydration, such as: Thirst. Dry lips. Slightly dry mouth. Dizziness. Dark urine or less urine than normal. Muscle cramps. You continue to vomit or have diarrhea. Get help right away if you: Have symptoms of dehydration that get worse. Have a fever. Have a severe headache. Have been vomiting and the following happens: Your vomiting gets worse or does not go away. Your vomit includes blood or green matter (bile). You cannot eat or drink without vomiting. Have problems with urination or bowel movements, such as: Diarrhea that gets worse or does not go away. Blood in your stool (feces). This may cause stool to look black and tarry. Not urinating, or urinating only a small amount of very dark urine, within 6-8 hours. Have trouble breathing. Have symptoms that get worse with treatment. These symptoms may represent a serious problem that is an emergency. Do not wait to see if the symptoms will go away. Get medical help right away. Call your local emergency services (911 in the [place] Do not drive yourself to the hospital. Summary Rehydration is the replacement of body fluids and minerals (electrolytes) that are lost during dehydration. Follow instructions from your health care provider for rehydration. The kind of fluid and amount you should drink depend on your condition. Slowly increase how much you drink until you have taken the amount recommended by your health care provider. Contact your health care provider if you continue to show signs of mild or moderate dehydration. This information is not intended to replace advice given to you by your health care provider. Make sure you discuss any questions you have with your health care provider. Document Revised: 02/02/2020 Document Reviewed: 12/13/2019 [employer] Patient  Education  2023 [employer]  

## 2022-08-03 LAB — T4: T4, Total: 8.3 ug/dL (ref 4.5–12.0)

## 2022-08-05 ENCOUNTER — Telehealth: Payer: Self-pay | Admitting: Hematology and Oncology

## 2022-08-05 ENCOUNTER — Telehealth: Payer: Self-pay

## 2022-08-05 ENCOUNTER — Other Ambulatory Visit (HOSPITAL_COMMUNITY): Payer: Self-pay

## 2022-08-05 NOTE — Telephone Encounter (Signed)
Returned call to daughter. She wanted to verify medications on hold until next visit. Per note, I Cardura, amlodipine, lenvatinib, furosemide and lisinopril on hold. She verbalized understanding and she is holding those medications.

## 2022-08-05 NOTE — Telephone Encounter (Signed)
Scheduled per 8/18 in basket, pt daughter has been called and confirmed appt

## 2022-08-06 ENCOUNTER — Other Ambulatory Visit (HOSPITAL_COMMUNITY): Payer: Self-pay

## 2022-08-07 ENCOUNTER — Other Ambulatory Visit: Payer: Self-pay

## 2022-08-08 ENCOUNTER — Other Ambulatory Visit (HOSPITAL_COMMUNITY): Payer: Self-pay

## 2022-08-09 ENCOUNTER — Other Ambulatory Visit (HOSPITAL_COMMUNITY): Payer: Self-pay

## 2022-08-09 ENCOUNTER — Encounter: Payer: Self-pay | Admitting: Hematology and Oncology

## 2022-08-09 ENCOUNTER — Other Ambulatory Visit: Payer: Self-pay

## 2022-08-09 ENCOUNTER — Inpatient Hospital Stay (HOSPITAL_BASED_OUTPATIENT_CLINIC_OR_DEPARTMENT_OTHER): Payer: Medicaid Other | Admitting: Hematology and Oncology

## 2022-08-09 ENCOUNTER — Inpatient Hospital Stay: Payer: Medicaid Other

## 2022-08-09 VITALS — BP 146/72 | HR 76 | Resp 16

## 2022-08-09 DIAGNOSIS — E669 Obesity, unspecified: Secondary | ICD-10-CM | POA: Diagnosis not present

## 2022-08-09 DIAGNOSIS — I10 Essential (primary) hypertension: Secondary | ICD-10-CM

## 2022-08-09 DIAGNOSIS — C541 Malignant neoplasm of endometrium: Secondary | ICD-10-CM

## 2022-08-09 DIAGNOSIS — E1169 Type 2 diabetes mellitus with other specified complication: Secondary | ICD-10-CM

## 2022-08-09 DIAGNOSIS — Z5112 Encounter for antineoplastic immunotherapy: Secondary | ICD-10-CM | POA: Diagnosis not present

## 2022-08-09 LAB — CBC WITH DIFFERENTIAL (CANCER CENTER ONLY)
Abs Immature Granulocytes: 0.02 10*3/uL (ref 0.00–0.07)
Basophils Absolute: 0 10*3/uL (ref 0.0–0.1)
Basophils Relative: 0 %
Eosinophils Absolute: 0 10*3/uL (ref 0.0–0.5)
Eosinophils Relative: 1 %
HCT: 29.8 % — ABNORMAL LOW (ref 36.0–46.0)
Hemoglobin: 9.9 g/dL — ABNORMAL LOW (ref 12.0–15.0)
Immature Granulocytes: 0 %
Lymphocytes Relative: 27 %
Lymphs Abs: 1.3 10*3/uL (ref 0.7–4.0)
MCH: 30.4 pg (ref 26.0–34.0)
MCHC: 33.2 g/dL (ref 30.0–36.0)
MCV: 91.4 fL (ref 80.0–100.0)
Monocytes Absolute: 0.6 10*3/uL (ref 0.1–1.0)
Monocytes Relative: 13 %
Neutro Abs: 2.9 10*3/uL (ref 1.7–7.7)
Neutrophils Relative %: 59 %
Platelet Count: 272 10*3/uL (ref 150–400)
RBC: 3.26 MIL/uL — ABNORMAL LOW (ref 3.87–5.11)
RDW: 15.2 % (ref 11.5–15.5)
WBC Count: 4.9 10*3/uL (ref 4.0–10.5)
nRBC: 0 % (ref 0.0–0.2)

## 2022-08-09 LAB — CMP (CANCER CENTER ONLY)
ALT: 23 U/L (ref 0–44)
AST: 20 U/L (ref 15–41)
Albumin: 3.6 g/dL (ref 3.5–5.0)
Alkaline Phosphatase: 109 U/L (ref 38–126)
Anion gap: 7 (ref 5–15)
BUN: 20 mg/dL (ref 8–23)
CO2: 23 mmol/L (ref 22–32)
Calcium: 9.9 mg/dL (ref 8.9–10.3)
Chloride: 107 mmol/L (ref 98–111)
Creatinine: 0.86 mg/dL (ref 0.44–1.00)
GFR, Estimated: >60 mL/min (ref >60)
Glucose, Bld: 134 mg/dL — ABNORMAL HIGH (ref 70–99)
Potassium: 3.9 mmol/L (ref 3.5–5.1)
Sodium: 137 mmol/L (ref 135–145)
Total Bilirubin: 0.4 mg/dL (ref 0.3–1.2)
Total Protein: 7 g/dL (ref 6.5–8.1)

## 2022-08-09 LAB — TOTAL PROTEIN, URINE DIPSTICK: Protein, ur: 300 mg/dL — AB

## 2022-08-09 MED ORDER — SODIUM CHLORIDE 0.9% FLUSH
10.0000 mL | INTRAVENOUS | Status: DC | PRN
  Administered 2022-08-09: 10 mL

## 2022-08-09 MED ORDER — SODIUM CHLORIDE 0.9% FLUSH
10.0000 mL | Freq: Once | INTRAVENOUS | Status: AC
  Administered 2022-08-09: 10 mL

## 2022-08-09 MED ORDER — SODIUM CHLORIDE 0.9 % IV SOLN: Freq: Once | INTRAVENOUS | Status: AC

## 2022-08-09 MED ORDER — SODIUM CHLORIDE 0.9 % IV SOLN
200.0000 mg | Freq: Once | INTRAVENOUS | Status: AC
  Administered 2022-08-09: 200 mg via INTRAVENOUS
  Filled 2022-08-09: qty 8

## 2022-08-09 MED ORDER — HEPARIN SOD (PORK) LOCK FLUSH 100 UNIT/ML IV SOLN
500.0000 [IU] | Freq: Once | INTRAVENOUS | Status: AC | PRN
  Administered 2022-08-09: 500 [IU]

## 2022-08-09 NOTE — Assessment & Plan Note (Signed)
She is noncompliant with dietary restrictions and crave sweet drinks We discussed importance of avoidance of artificial sweetener and sweet tea

## 2022-08-09 NOTE — Assessment & Plan Note (Signed)
Her blood pressure is elevated because I took her off several different antihypertensives lately With improvement of performance status and oral intake, I recommend resumption of amlodipine as well as lisinopril She is instructed to continue to check her blood pressure at home

## 2022-08-09 NOTE — Progress Notes (Signed)
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[place]) Overall, the performance status has improved We will resume treatment today I gave her written instruction to resume Lenvima at home and she will receive pembrolizumab today I plan a few more cycles of treatment before repeating CT imaging in October  Chronic kidney disease, stage 3a ([place]) Her oral intake remain poor but much improved I recommend we continue to hold her diuretic therapy and increase oral fluid intake as tolerated  Essential hypertension Her blood pressure is elevated because I took her off several different antihypertensives lately With improvement of performance status and oral intake, I recommend resumption of amlodipine as well as lisinopril She is instructed to continue to check her blood pressure at home  Diabetes mellitus type 2 in obese [facility]) She is noncompliant with dietary restrictions and crave sweet drinks We discussed importance of avoidance of artificial sweetener and sweet tea  No orders of the defined types were placed in this encounter.   All questions were answered. The patient knows to call the clinic with any problems, questions or concerns. The total time spent in the appointment was 30 minutes encounter with patients including review of chart and various tests results, discussions about plan of care and coordination of care plan   Heath Lark, MD 08/09/2022 9:47 AM  INTERVAL HISTORY: Please see below for problem oriented charting. she returns for treatment follow-up with her daughter She brought with her documented blood pressure from home Over the past few days, her blood pressure started to trend up Her oral intake has improved and she drinks approximately 60 to 80 ounces of liquid but  according to her daughter, in general, she craves sweet drinks  REVIEW OF SYSTEMS:   Constitutional: Denies fevers, chills or abnormal weight loss Eyes: Denies blurriness of vision Ears, nose, mouth, throat, and face: Denies mucositis or sore throat Respiratory: Denies cough, dyspnea or wheezes Cardiovascular: Denies palpitation, chest discomfort or lower extremity swelling Gastrointestinal:  Denies nausea, heartburn or change in bowel habits Skin: Denies abnormal skin rashes Lymphatics: Denies new lymphadenopathy or easy bruising Neurological:Denies numbness, tingling or new weaknesses Behavioral/Psych: Mood is stable, no new changes  All other systems were reviewed with the patient and are negative.  I have reviewed the past medical history, past surgical history, social history and family history with the patient and they are unchanged from previous note.  ALLERGIES:  has No Known Allergies.  MEDICATIONS:  Current Outpatient Medications  Medication Sig Dispense Refill   amLODipine (NORVASC) 10 MG tablet Take 10 mg by mouth daily.     lisinopril (ZESTRIL) 20 MG tablet Take 20 mg by mouth daily.     aspirin 81 MG EC tablet Take 1 tablet (81 mg total) by mouth daily. 30 tablet 12   atorvastatin (LIPITOR) 80 MG tablet TAKE 1 TABLET (80 MG TOTAL) BY MOUTH AT BEDTIME. 30 tablet 6   cholestyramine light (PREVALITE) 4 g packet Dissolve in water as directed then take 1 packet by mouth daily at 12 noon. 30 each 0   Dulaglutide (TRULICITY) 1.5 TD/3.2KG SOPN Inject 1.5 mg into the skin once a week. 2 mL 6   glimepiride (AMARYL) 4 MG tablet Take 1 tablet (4 mg total) by mouth daily with breakfast. 30 tablet 6   isosorbide mononitrate (IMDUR) 30 MG  24 hr tablet Take 2 tablets (60 mg total) by mouth daily. 60 tablet 6   metFORMIN (GLUCOPHAGE) 500 MG tablet Take 2 tablets (1,000 mg total) by mouth 2 (two) times daily with a meal. 120 tablet 6   metoprolol tartrate (LOPRESSOR) 100 MG tablet TAKE 1  TABLET (100 MG TOTAL) BY MOUTH 2 (TWO) TIMES DAILY. 60 tablet 6   nitroGLYCERIN (NITROSTAT) 0.4 MG SL tablet Place 1 tablet (0.4 mg total) under the tongue every 5 (five) minutes as needed for chest pain. 25 tablet 2   No current facility-administered medications for this visit.   Facility-Administered Medications Ordered in Other Visits  Medication Dose Route Frequency Provider Last Rate Last Admin   0.9 %  sodium chloride infusion   Intravenous Once Alvy Bimler, Helen Winterhalter, MD       heparin lock flush 100 unit/mL  500 Units Intracatheter Once PRN Alvy Bimler, Jahki Witham, MD       pembrolizumab (KEYTRUDA) 200 mg in sodium chloride 0.9 % 50 mL chemo infusion  200 mg Intravenous Once Alvy Bimler, Kainat Pizana, MD       sodium chloride flush (NS) 0.9 % injection 10 mL  10 mL Intracatheter PRN Heath Lark, MD        SUMMARY OF ONCOLOGIC HISTORY: Oncology History Overview Note  Pap 12/14/20: adenocarcinoma, HPV negative  HER-2 positive by FISH Her-2 equivocal by [facility]  MSI stable    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 ENDOCERVIX, CURETTAGE:  - Adenocarcinoma.  B. ENDOMETRIUM, BIOPSY:  - Adenocarcinoma.  COMMENT:  The differential diagnosis includes high grade serous carcinoma.  Both  specimens have a similar morphology; high grade serous carcinoma more  commonly arises in the endometrium.  Results reported to Dr. Hale Bogus  on 01/15/2021.  Dr. Saralyn Pilar reviewed the case.    01/16/2021 Tumor Marker   Patient's tumor was tested for the following markers: CA-125 Results of the tumor marker test revealed normal value, 10.7   01/25/2021 Imaging   CT C/A/P: 1. Small volume fluid in the endometrial canal with 7 mm hypoattenuating lesion in the myometrium of the anterior fundus. 2. No definite evidence for metastatic disease in the chest, abdomen, or pelvis. Small lymph nodes are noted along both pelvic sidewalls. Attention  on follow-up recommended. 3. 5 mm ground-glass opacity peripheral left upper lobe. This is likely related to infection/inflammation and potentially scar. Attention on surveillance imaging recommended. 4. 5.8 cm lesion posterior lower uterine segment likely a fibroid. Ultrasound exam from 01/28/2010 demonstrated a 5.2 cm lesion in the left aspect of the posterior lower uterine body. 5. Aortic Atherosclerosis (ICD10-I70.0).   01/29/2021 Surgery   TRH/BSO, SLN biopsy left, selective pelvic LND right, omentectomy  Findings: On EUA, 10-12cm enlarged mobile uterus. On intra-abdomina entry, minimal adhesions between the liver and anterior abdomen on the right. Some changes c/w fatty liver on the left. Omentum, stomach, small and large bowel all grossly normal. Bilateral ovaries normal appearing. Uterus with 5-6cm posterior fibroid, otherwise normal appearing. No mapping to right pelvis. On left, mapping to the level of the superior vessel artery, enlarged lymph node noted (likely sentinel) within the upper aspect of the obturator space. In bilateral pelvic basins, multiple enlarged lymph nodes. On the right, enlarged lymph nodes extended superiorly along the common iliac vessels. At the end of surgery, no obvious abdominal or pelvic evidence of disease.   01/29/2021 Pathology Results   A. SENTINEL  LYMPH NODE, LEFT INTERNAL ILIAC, BIOPSY:  - Metastatic carcinoma in (1) of (1) lymph node.   B. SENTINEL LYMPH NODE, LEFT OBTURATOR, BIOPSY:  - Metastatic carcinoma in (1) of (1) lymph node.   C. LYMPH NODES, LEFT PELVIC, DISSECTION:  - Metastatic carcinoma in (1) of (3) lymph nodes.   D. LYMPH NODE, RIGHT EXTERNAL AND COMMON ILIAC, BIOPSY:  - Metastatic carcinoma in (1) of (1) lymph node.   E. UTERUS, CERVIX AND BILATERAL FALLOPIAN TUBES AND OVARIES, TOTAL  HYSTERECTOMY AND BILATERAL SALPINGO-OOPHORECTOMY:  - High grade serous carcinoma of endometrium, with invasion more than  half of the myometrium.   - Tumor invades the stromal connective tissue of the cervix.  - No involvement of uterine serosa or adnexa.  - Lymphovascular invasion is identified.  - See oncology table.   F. OMENTUM, OMENTECTOMY:  - Omentum, negative for carcinoma.   G. LYMPH NODES, RIGHT PELVIC, DISSECTION:  - Two lymph nodes, negative for carcinoma (0/2).   ONCOLOGY TABLE:   UTERUS, CARCINOMA OR CARCINOSARCOMA: Resection   Procedure: Total hysterectomy and bilateral salpingo-oophorectomy,  Omentectomy, Lymph node sampling  Histologic Type: Serous carcinoma  Histologic Grade: High grade  Myometrial Invasion: > 50%  Uterine Serosa Involvement: Not identified  Cervical Stroma Involvement: Present  Other Tissue/Organ Involvement: Not identified  Peritoneal/Ascitic Fluid: Negative for carcinoma  Lymphovascular Invasion: Present  Regional Lymph Nodes:       Pelvic Lymph Nodes Examined:            2 Sentinel            6 Non-Sentinel            8 Total       Pelvic Lymph Nodes with Metastasis: 4            Macrometastasis: 3            Micrometastasis: 1            Isolated Tumor Cells: 0            Laterality of Lymph Nodes with Tumor: Right (non-sentinel),  Left (sentinel and non-sentinel)            Extracapsular Extension: Present       Para-Aortic Lymph Nodes Examined: Not applicable        Para-Aortic Lymph Nodes with Metastasis: Not applicable  Distant Metastasis:       Distant Site(s) Involved: Omentum: Not involved  Pathologic Stage Classification (pTNM, AJCC 8th Edition): pT2, pN1a  Ancillary Studies: HER2 will be ordered  Additional Findings: Leiomyomata.  Adenomyosis.  Representative Tumor Block: B1  Comment: Dr. Saralyn Pilar reviewed select slides.  (v4.2.0.1)    01/29/2021 Cancer Staging   Staging form: Corpus Uteri - Carcinoma and Carcinosarcoma, AJCC 8th Edition - Clinical stage from 01/29/2021: FIGO Stage IIIC1 (cT1b, cN1a, cM0) - Signed by Lafonda Mosses, MD on  02/02/2021 Histopathologic type: Mixed cell adenocarcinoma Stage prefix: Initial diagnosis Method of lymph node assessment: Other Histologic grade (G): G3 Histologic grading system: 3 grade system Lymph-vascular invasion (LVI): LVI present/identified, NOS Peritoneal cytology results: Negative Pelvic nodal status: Positive Number of pelvic nodes positive from dissection: 4 Number of pelvic nodes examined during dissection: 8 Para-aortic status: Not assessed Lymph node metastasis: Present Omentectomy performed: Yes Morcellation performed: No   02/19/2021 Echocardiogram    1. Left ventricular ejection fraction, by estimation, is 55 to 60%. The left ventricle has normal function. The left ventricle demonstrates regional wall motion abnormalities (see scoring diagram/findings  for description). There is mild left ventricular  hypertrophy. Left ventricular diastolic parameters are consistent with Grade I diastolic dysfunction (impaired relaxation). There is moderate hypokinesis of the left ventricular, basal septal wall and inferior wall. The average left ventricular global longitudinal strain is -17.9 %. The global longitudinal strain is abnormal.  2. Right ventricular systolic function is normal. The right ventricular size is normal. There is normal pulmonary artery systolic pressure. The estimated right ventricular systolic pressure is 23.5 mmHg.  3. The mitral valve is abnormal. Trivial mitral valve regurgitation.  4. The aortic valve is tricuspid. Aortic valve regurgitation is not visualized.  5. The inferior vena cava is normal in size with greater than 50% respiratory variability, suggesting right atrial pressure of 3 mmHg.   02/20/2021 Procedure   Successful placement of a right IJ approach Power Port with ultrasound and fluoroscopic guidance. The catheter is ready for use.   02/28/2021 - 06/14/2021 Chemotherapy    Patient is on Treatment Plan: UTERINE CARBOPLATIN AUC 6 / PACLITAXEL Q21D        04/20/2021 Imaging   Status post hysterectomy and suspected bilateral salpingo-oophorectomy.   2.2 cm fluid density lesion along the left pelvic sidewall likely reflects a postoperative seroma.   No findings suspicious for recurrent or metastatic disease.   07/16/2021 - 08/16/2021 Radiation Therapy   Radiation Treatment Dates: 07/16/2021 through 08/16/2021 Site Technique Total Dose (Gy) Dose per Fx (Gy) Completed Fx Beam Energies  Vagina: Pelvis HDR-brachy 30/30 6 5/5 Ir-192        10/01/2021 Imaging   Increasing size of LEFT pelvic lymph nodes, largest approximately 11 mm along the external iliac chain. Given findings and history could consider PET for further evaluation as warranted.   Cystic area along the LEFT pelvic sidewall that was seen previously has improved.   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   Aortic Atherosclerosis (ICD10-I70.0).   12/21/2021 Imaging   1. Enlarging soft tissue masses involving the vaginal cuff. 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 Speculum examination and re-biopsy may be confirmatory. PET-CT may be helpful. 2. New small sub 5 mm perirectal and sigmoid mesocolon nodes. 3. Slight progression of pelvic adenopathy as detailed above. 4. No findings suspicious for abdominal metastatic disease or osseous metastatic disease. 5. Stable age advanced atherosclerotic calcifications involving the aorta and branch vessels and coronary arteries   12/25/2021 Echocardiogram    1. Global longitudinal strain is -16.1% Mild basal inferior hypoinesis. Compared to echo from 02/2021, no change in overall LVEF /regional wall motion. Global longitudinal strain is mildly less negative (-17.9% to -16.1%. Left ventricular ejection fraction, by estimation, is 65 to 70%. The left ventricle has normal function. The left ventricle has no regional wall motion  abnormalities. There is mild left ventricular hypertrophy. Left ventricular diastolic parameters are indeterminate.  2. Right ventricular systolic function is normal. The right ventricular size is normal. There is normal pulmonary artery systolic pressure.  3. Trivial mitral valve regurgitation.  4. The aortic valve is normal in structure. Aortic valve regurgitation is not visualized.  5. The inferior vena cava is normal in size with greater than 50% respiratory variability, suggesting right atrial pressure of 3 mmHg.     01/01/2022 - 01/22/2022 Chemotherapy   Patient is on Treatment Plan : UTERINE SEROUS CARCINOMA Carboplatin + Paclitaxel + Trastuzumab q21d x 6 Cycles / Trastuzumab q21d     03/29/2022  Pathology Results   A.   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   COMMENT:   The carcinoma has diffuse strong Pax8 immunoreactivity (Mullerian marker) and abnormal overexpression of nuclear p53 and relatively diffuse strong p16 staining.  Estrogen receptor (ER) is weakly reactive  and there is focal strong Napsin A immunoreactivity.  The immunophenotype is most characteristic of serous carcinoma.   The history of prior treatment for endometrial serous carcinoma is noted.   04/12/2022 Imaging   1. Surgical change of prior hysterectomy with increased nodular thickening at the vaginal cuff, suspicious for recurrent disease. Consider further evaluation with PET-CT versus direct tissue sampling. 2. Unchanged prominent/mildly enlarged retroperitoneal and pelvic lymph nodes, nonspecific but in the setting of disease recurrence these would be at least somewhat suspicious for nodal involvement. Consider attention on follow-up imaging. 3. No evidence of metastatic disease within the chest. 4. Left-sided colonic diverticulosis without findings of acute diverticulitis. 5.   Aortic Atherosclerosis (ICD10-I70.0).   04/16/2022 -  Chemotherapy   Patient is on Treatment Plan : UTERINE Lenvatinib (20) D1-21 + Pembrolizumab (200) D1 q21d     07/09/2022 Imaging   Mild increase in mild left para-aortic and left iliac lymphadenopathy, consistent with metastatic lymphadenopathy.   Stable nodular thickening of vaginal cuff, consistent with vaginal recurrence.   Colonic diverticulosis, without radiographic evidence of diverticulitis.   Probable tiny calcified gallstone. No radiographic evidence of cholecystitis.   Aortic Atherosclerosis (ICD10-I70.0).       PHYSICAL EXAMINATION: ECOG PERFORMANCE STATUS: 1 - Symptomatic but completely ambulatory  Vitals:   08/09/22 0927  BP: (!) 150/73  Pulse: 73  Resp: 18  Temp: (!) 97.4 F (36.3 C)  SpO2: 100%   Filed Weights   08/09/22 0927  Weight: 208 lb 3.2 oz (94.4 kg)    GENERAL:alert, no distress and comfortable NEURO: alert & oriented x 3 with fluent speech, no focal motor/sensory deficits  LABORATORY DATA:  I have reviewed the data as listed    Component Value Date/Time   NA 137 08/09/2022 0901   NA 143 10/18/2020 1028   K 3.9 08/09/2022 0901   CL 107 08/09/2022 0901   CO2 23 08/09/2022 0901   GLUCOSE 134 (H) 08/09/2022 0901   BUN 20 08/09/2022 0901   BUN 13 10/18/2020 1028   CREATININE 0.86 08/09/2022 0901   CREATININE 0.79 02/11/2017 0943   CALCIUM 9.9 08/09/2022 0901   PROT 7.0 08/09/2022 0901   PROT 6.7 10/18/2020 1028   ALBUMIN 3.6 08/09/2022 0901   ALBUMIN 4.1 10/18/2020 1028   AST 20 08/09/2022 0901   ALT 23 08/09/2022 0901   ALKPHOS 109 08/09/2022 0901   BILITOT 0.4 08/09/2022 0901   GFRNONAA >60 08/09/2022 0901   GFRNONAA 83 02/11/2017 0943   GFRAA 70 10/18/2020 1028   GFRAA >89 02/11/2017 0943    No results found for: "SPEP", "UPEP"  Lab Results  Component Value Date   WBC 4.9 08/09/2022   NEUTROABS 2.9 08/09/2022   HGB 9.9 (L) 08/09/2022   HCT 29.8 (L) 08/09/2022   MCV  91.4 08/09/2022   PLT 272 08/09/2022      Chemistry      Component Value Date/Time   NA 137 08/09/2022 0901   NA 143 10/18/2020 1028   K 3.9 08/09/2022 0901   CL 107 08/09/2022 0901   CO2 23 08/09/2022 0901   BUN 20  08/09/2022 0901   BUN 13 10/18/2020 1028   CREATININE 0.86 08/09/2022 0901   CREATININE 0.79 02/11/2017 0943      Component Value Date/Time   CALCIUM 9.9 08/09/2022 0901   ALKPHOS 109 08/09/2022 0901   AST 20 08/09/2022 0901   ALT 23 08/09/2022 0901   BILITOT 0.4 08/09/2022 0901

## 2022-08-09 NOTE — Assessment & Plan Note (Signed)
Her oral intake remain poor but much improved I recommend we continue to hold her diuretic therapy and increase oral fluid intake as tolerated

## 2022-08-09 NOTE — Patient Instructions (Addendum)
[facility]   Discharge Instructions: Thank you for choosing [facility] to provide your oncology and hematology care.   If you have a lab appointment with the [facility], please go directly to the [facility] and check in at the registration area.   Wear comfortable clothing and clothing appropriate for easy access to any Portacath or PICC line.   We strive to give you quality time with your provider. You may need to reschedule your appointment if you arrive late (15 or more minutes).  Arriving late affects you and other patients whose appointments are after yours.  Also, if you miss three or more appointments without notifying the office, you may be dismissed from the clinic at the provider's discretion.      For prescription refill requests, have your pharmacy contact our office and allow 72 hours for refills to be completed.    Today you received the following chemotherapy and/or immunotherapy agents: Pembrolizumab Beryle Flock)      To help prevent nausea and vomiting after your treatment, we encourage you to take your nausea medication as directed.  BELOW ARE SYMPTOMS THAT SHOULD BE REPORTED IMMEDIATELY: *FEVER GREATER THAN 100.4 F (38 C) OR HIGHER *CHILLS OR SWEATING *NAUSEA AND VOMITING THAT IS NOT CONTROLLED WITH YOUR NAUSEA MEDICATION *UNUSUAL SHORTNESS OF BREATH *UNUSUAL BRUISING OR BLEEDING *URINARY PROBLEMS (pain or burning when urinating, or frequent urination) *BOWEL PROBLEMS (unusual diarrhea, constipation, pain near the anus) TENDERNESS IN MOUTH AND THROAT WITH OR WITHOUT PRESENCE OF ULCERS (sore throat, sores in mouth, or a toothache) UNUSUAL RASH, SWELLING OR PAIN  UNUSUAL VAGINAL DISCHARGE OR ITCHING   Items with * indicate a potential emergency and should be followed up as soon as possible or go to the Emergency Department if any problems should occur.  Please show the CHEMOTHERAPY ALERT CARD or IMMUNOTHERAPY ALERT  CARD at check-in to the Emergency Department and triage nurse.  Should you have questions after your visit or need to cancel or reschedule your appointment, please contact [facility]  Dept: [phone]  and follow the prompts.  Office hours are 8:00 a.m. to 4:30 p.m. Monday - Friday. Please note that voicemails left after 4:00 p.m. may not be returned until the following business day.  We are closed weekends and major holidays. You have access to a nurse at all times for urgent questions. Please call the main number to the clinic Dept: [phone] and follow the prompts.   For any non-urgent questions, you may also contact your provider using MyChart. We now offer e-Visits for anyone 86 and older to request care online for non-urgent symptoms. For details visit [email or web address].   Also download the MyChart app! Go to the app store, search "MyChart", open the app, select [facility], and log in with your MyChart username and password.  Masks are optional in the cancer centers. If you would like for your care team to wear a mask while they are taking care of you, please let them know. For doctor visits, patients may have with them one support person who is at least 64 years old. At this time, visitors are not allowed in the infusion area.  Pembrolizumab Injection What is this medication? PEMBROLIZUMAB (PEM broe LIZ ue mab) treats some types of cancer. It works by helping your immune system slow or stop the spread of cancer cells. It is a monoclonal antibody. This medicine may be used for other purposes; ask your health  care provider or pharmacist if you have questions. COMMON BRAND NAME(S): Keytruda What should I tell my care team before I take this medication? They need to know if you have any of these conditions: Allogeneic stem cell transplant (uses someone else's stem cells) Autoimmune diseases, such as Crohn disease, ulcerative colitis, lupus History of  chest radiation Nervous system problems, such as Guillain-Barre syndrome, myasthenia gravis Organ transplant An unusual or allergic reaction to pembrolizumab, other medications, foods, dyes, or preservatives Pregnant or trying to get pregnant Breast-feeding How should I use this medication? This medication is injected into a vein. It is given by your care team in a hospital or clinic setting. A special MedGuide will be given to you before each treatment. Be sure to read this information carefully each time. Talk to your care team about the use of this medication in children. While it may be prescribed for children as young as 6 months for selected conditions, precautions do apply. Overdosage: If you think you have taken too much of this medicine contact a poison control center or emergency room at once. NOTE: This medicine is only for you. Do not share this medicine with others. What if I miss a dose? Keep appointments for follow-up doses. It is important not to miss your dose. Call your care team if you are unable to keep an appointment. What may interact with this medication? Interactions have not been studied. This list may not describe all possible interactions. Give your health care provider a list of all the medicines, herbs, non-prescription drugs, or dietary supplements you use. Also tell them if you smoke, drink alcohol, or use illegal drugs. Some items may interact with your medicine. What should I watch for while using this medication? Your condition will be monitored carefully while you are receiving this medication. You may need blood work while taking this medication. This medication may cause serious skin reactions. They can happen weeks to months after starting the medication. Contact your care team right away if you notice fevers or flu-like symptoms with a rash. The rash may be red or purple and then turn into blisters or peeling of the skin. You may also notice a red rash with  swelling of the face, lips, or lymph nodes in your neck or under your arms. Tell your care team right away if you have any change in your eyesight. Talk to your care team if you may be pregnant. Serious birth defects can occur if you take this medication during pregnancy and for 4 months after the last dose. You will need a negative pregnancy test before starting this medication. Contraception is recommended while taking this medication and for 4 months after the last dose. Your care team can help you find the option that works for you. Do not breastfeed while taking this medication and for 4 months after the last dose. What side effects may I notice from receiving this medication? Side effects that you should report to your care team as soon as possible: Allergic reactions--skin rash, itching, hives, swelling of the face, lips, tongue, or throat Dry cough, shortness of breath or trouble breathing Eye pain, redness, irritation, or discharge with blurry or decreased vision Heart muscle inflammation--unusual weakness or fatigue, shortness of breath, chest pain, fast or irregular heartbeat, dizziness, swelling of the ankles, feet, or hands Hormone gland problems--headache, sensitivity to light, unusual weakness or fatigue, dizziness, fast or irregular heartbeat, increased sensitivity to cold or heat, excessive sweating, constipation, hair loss, increased thirst or  amount of urine, tremors or shaking, irritability Infusion reactions--chest pain, shortness of breath or trouble breathing, feeling faint or lightheaded Kidney injury (glomerulonephritis)--decrease in the amount of urine, red or dark brown urine, foamy or bubbly urine, swelling of the ankles, hands, or feet Liver injury--right upper belly pain, loss of appetite, nausea, light-colored stool, dark yellow or brown urine, yellowing skin or eyes, unusual weakness or fatigue Pain, tingling, or numbness in the hands or feet, muscle weakness, change in  vision, confusion or trouble speaking, loss of balance or coordination, trouble walking, seizures Rash, fever, and swollen lymph nodes Redness, blistering, peeling, or loosening of the skin, including inside the mouth Sudden or severe stomach pain, bloody diarrhea, fever, nausea, vomiting Side effects that usually do not require medical attention (report to your care team if they continue or are bothersome): Bone, joint, or muscle pain Diarrhea Fatigue Loss of appetite Nausea Skin rash This list may not describe all possible side effects. Call your doctor for medical advice about side effects. You may report side effects to FDA at 1-800-FDA-1088. Where should I keep my medication? This medication is given in a hospital or clinic. It will not be stored at home. NOTE: This sheet is a summary. It may not cover all possible information. If you have questions about this medicine, talk to your doctor, pharmacist, or health care provider.  2023 Elsevier/Gold Standard (2022-03-25 00:00:00)

## 2022-08-09 NOTE — Assessment & Plan Note (Signed)
Overall, the performance status has improved We will resume treatment today I gave her written instruction to resume Lenvima at home and she will receive pembrolizumab today I plan a few more cycles of treatment before repeating CT imaging in October

## 2022-08-10 ENCOUNTER — Other Ambulatory Visit: Payer: Self-pay

## 2022-08-11 ENCOUNTER — Other Ambulatory Visit: Payer: Self-pay | Admitting: Family Medicine

## 2022-08-11 ENCOUNTER — Other Ambulatory Visit: Payer: Self-pay

## 2022-08-12 ENCOUNTER — Other Ambulatory Visit (HOSPITAL_COMMUNITY): Payer: Self-pay

## 2022-08-12 ENCOUNTER — Other Ambulatory Visit: Payer: Self-pay | Admitting: Hematology and Oncology

## 2022-08-12 DIAGNOSIS — C541 Malignant neoplasm of endometrium: Secondary | ICD-10-CM

## 2022-08-12 MED ORDER — AMLODIPINE BESYLATE 10 MG PO TABS
10.0000 mg | ORAL_TABLET | Freq: Every day | ORAL | 0 refills | Status: DC
  Filled 2022-08-12: qty 90, 90d supply, fill #0

## 2022-08-12 MED ORDER — LENVIMA (10 MG DAILY DOSE) 10 MG PO CPPK
10.0000 mg | ORAL_CAPSULE | Freq: Every day | ORAL | 11 refills | Status: DC
  Filled 2022-08-12: qty 30, 30d supply, fill #0
  Filled 2022-09-23: qty 30, 30d supply, fill #1
  Filled 2022-10-21: qty 30, 30d supply, fill #2
  Filled 2022-11-20 – 2022-11-27 (×2): qty 30, 30d supply, fill #3
  Filled 2022-12-23: qty 30, 30d supply, fill #4
  Filled 2023-01-22: qty 30, 30d supply, fill #5
  Filled 2023-04-24: qty 30, 30d supply, fill #6

## 2022-08-12 MED ORDER — LISINOPRIL 20 MG PO TABS
20.0000 mg | ORAL_TABLET | Freq: Every day | ORAL | 0 refills | Status: DC
Start: 2022-08-12 — End: 2022-08-30
  Filled 2022-08-12: qty 90, 90d supply, fill #0

## 2022-08-16 ENCOUNTER — Other Ambulatory Visit: Payer: Self-pay

## 2022-08-20 ENCOUNTER — Other Ambulatory Visit: Payer: Self-pay | Admitting: Hematology and Oncology

## 2022-08-20 DIAGNOSIS — C541 Malignant neoplasm of endometrium: Secondary | ICD-10-CM

## 2022-08-20 NOTE — Progress Notes (Signed)
ON PATHWAY REGIMEN - Uterine  No Change  Continue With Treatment as Ordered.  Original Decision Date/Time: 04/04/2022 14:28     A cycle is every 21 days:     Lenvatinib      Pembrolizumab   **Always confirm dose/schedule in your pharmacy ordering system**  Patient Characteristics: Serous Carcinoma, Recurrent/Progressive Disease, Third Line and Beyond, HER2 Positive Histology: Serous Carcinoma Therapeutic Status: Recurrent or Progressive Disease Line of Therapy: Third Line and Beyond HER2 Status: Positive Intent of Therapy: Non-Curative / Palliative Intent, Discussed with Patient

## 2022-08-30 ENCOUNTER — Encounter: Payer: Self-pay | Admitting: Hematology and Oncology

## 2022-08-30 ENCOUNTER — Inpatient Hospital Stay: Payer: Medicaid Other | Attending: Gynecologic Oncology

## 2022-08-30 ENCOUNTER — Inpatient Hospital Stay (HOSPITAL_BASED_OUTPATIENT_CLINIC_OR_DEPARTMENT_OTHER): Payer: Medicaid Other | Admitting: Hematology and Oncology

## 2022-08-30 ENCOUNTER — Inpatient Hospital Stay: Payer: Medicaid Other

## 2022-08-30 ENCOUNTER — Other Ambulatory Visit: Payer: Self-pay

## 2022-08-30 VITALS — BP 147/74 | HR 78 | Temp 98.0°F | Resp 18 | Ht 62.0 in | Wt 204.2 lb

## 2022-08-30 DIAGNOSIS — C541 Malignant neoplasm of endometrium: Secondary | ICD-10-CM

## 2022-08-30 DIAGNOSIS — T451X5A Adverse effect of antineoplastic and immunosuppressive drugs, initial encounter: Secondary | ICD-10-CM | POA: Diagnosis not present

## 2022-08-30 DIAGNOSIS — Z7982 Long term (current) use of aspirin: Secondary | ICD-10-CM | POA: Diagnosis not present

## 2022-08-30 DIAGNOSIS — Z923 Personal history of irradiation: Secondary | ICD-10-CM | POA: Diagnosis not present

## 2022-08-30 DIAGNOSIS — Z79899 Other long term (current) drug therapy: Secondary | ICD-10-CM | POA: Diagnosis not present

## 2022-08-30 DIAGNOSIS — D6481 Anemia due to antineoplastic chemotherapy: Secondary | ICD-10-CM | POA: Diagnosis not present

## 2022-08-30 DIAGNOSIS — I10 Essential (primary) hypertension: Secondary | ICD-10-CM | POA: Diagnosis not present

## 2022-08-30 DIAGNOSIS — I700 Atherosclerosis of aorta: Secondary | ICD-10-CM | POA: Insufficient documentation

## 2022-08-30 DIAGNOSIS — Z7984 Long term (current) use of oral hypoglycemic drugs: Secondary | ICD-10-CM | POA: Diagnosis not present

## 2022-08-30 DIAGNOSIS — Z7985 Long-term (current) use of injectable non-insulin antidiabetic drugs: Secondary | ICD-10-CM | POA: Insufficient documentation

## 2022-08-30 DIAGNOSIS — Z5112 Encounter for antineoplastic immunotherapy: Secondary | ICD-10-CM | POA: Insufficient documentation

## 2022-08-30 LAB — CMP (CANCER CENTER ONLY)
ALT: 20 U/L (ref 0–44)
AST: 20 U/L (ref 15–41)
Albumin: 3.2 g/dL — ABNORMAL LOW (ref 3.5–5.0)
Alkaline Phosphatase: 82 U/L (ref 38–126)
Anion gap: 7 (ref 5–15)
BUN: 20 mg/dL (ref 8–23)
CO2: 24 mmol/L (ref 22–32)
Calcium: 9.6 mg/dL (ref 8.9–10.3)
Chloride: 108 mmol/L (ref 98–111)
Creatinine: 0.9 mg/dL (ref 0.44–1.00)
GFR, Estimated: >60 mL/min (ref >60)
Glucose, Bld: 140 mg/dL — ABNORMAL HIGH (ref 70–99)
Potassium: 3.8 mmol/L (ref 3.5–5.1)
Sodium: 139 mmol/L (ref 135–145)
Total Bilirubin: 0.6 mg/dL (ref 0.3–1.2)
Total Protein: 7.2 g/dL (ref 6.5–8.1)

## 2022-08-30 LAB — CBC WITH DIFFERENTIAL (CANCER CENTER ONLY)
Abs Immature Granulocytes: 0.01 10*3/uL (ref 0.00–0.07)
Basophils Absolute: 0 10*3/uL (ref 0.0–0.1)
Basophils Relative: 0 %
Eosinophils Absolute: 0.1 10*3/uL (ref 0.0–0.5)
Eosinophils Relative: 1 %
HCT: 32.2 % — ABNORMAL LOW (ref 36.0–46.0)
Hemoglobin: 10.9 g/dL — ABNORMAL LOW (ref 12.0–15.0)
Immature Granulocytes: 0 %
Lymphocytes Relative: 31 %
Lymphs Abs: 1.5 10*3/uL (ref 0.7–4.0)
MCH: 30.4 pg (ref 26.0–34.0)
MCHC: 33.9 g/dL (ref 30.0–36.0)
MCV: 89.7 fL (ref 80.0–100.0)
Monocytes Absolute: 0.6 10*3/uL (ref 0.1–1.0)
Monocytes Relative: 12 %
Neutro Abs: 2.7 10*3/uL (ref 1.7–7.7)
Neutrophils Relative %: 56 %
Platelet Count: 281 10*3/uL (ref 150–400)
RBC: 3.59 MIL/uL — ABNORMAL LOW (ref 3.87–5.11)
RDW: 14.2 % (ref 11.5–15.5)
WBC Count: 4.8 10*3/uL (ref 4.0–10.5)
nRBC: 0 % (ref 0.0–0.2)

## 2022-08-30 MED ORDER — HEPARIN SOD (PORK) LOCK FLUSH 100 UNIT/ML IV SOLN
500.0000 [IU] | Freq: Once | INTRAVENOUS | Status: AC | PRN
  Administered 2022-08-30: 500 [IU]

## 2022-08-30 MED ORDER — LISINOPRIL 20 MG PO TABS: 30.0000 mg | ORAL_TABLET | Freq: Every day | ORAL | 0 refills | Status: DC

## 2022-08-30 MED ORDER — SODIUM CHLORIDE 0.9% FLUSH
10.0000 mL | INTRAVENOUS | Status: DC | PRN
  Administered 2022-08-30: 10 mL

## 2022-08-30 MED ORDER — SODIUM CHLORIDE 0.9 % IV SOLN: Freq: Once | INTRAVENOUS | Status: AC

## 2022-08-30 MED ORDER — SODIUM CHLORIDE 0.9 % IV SOLN
200.0000 mg | Freq: Once | INTRAVENOUS | Status: AC
  Administered 2022-08-30: 200 mg via INTRAVENOUS
  Filled 2022-08-30: qty 200

## 2022-08-30 MED ORDER — SODIUM CHLORIDE 0.9% FLUSH
10.0000 mL | Freq: Once | INTRAVENOUS | Status: AC
  Administered 2022-08-30: 10 mL

## 2022-08-30 NOTE — Progress Notes (Signed)
[place] OFFICE PROGRESS NOTE  Patient Care Team: [name], MD as PCP - General (Family Medicine) Croitoru, Dani Gobble, MD as PCP - Cardiology (Cardiology)  ASSESSMENT & PLAN:  Papillary serous adenocarcinoma of endometrium [facility]) She tolerated recent treatment well except for elevated blood pressure I am concerned about recurrent vaginal spotting I recommend we proceed with treatment without delay and plan to repeat CT imaging in 2 weeks for further follow-up  Essential hypertension Her blood pressure remain elevated I recommend increasing the dose of lisinopril to 30 mg daily  Anemia due to antineoplastic chemotherapy She has multifactorial anemia, component of anemia chronic illness and from prior treatment Observe closely  Orders Placed This Encounter  Procedures   CT ABDOMEN PELVIS W CONTRAST    Standing Status:   Future    Standing Expiration Date:   08/31/2023    Order Specific Question:   If indicated for the ordered procedure, I authorize the administration of contrast media per Radiology protocol    Answer:   Yes    Order Specific Question:   Preferred imaging location?    Answer:   [facility]    Order Specific Question:   Radiology Contrast Protocol - do NOT remove file path    Answer:   [email or web address]    All questions were answered. The patient knows to call the clinic with any problems, questions or concerns. The total time spent in the appointment was 30 minutes encounter with patients including review of chart and various tests results, discussions about plan of care and coordination of care plan   Heath Lark, MD 08/30/2022 12:05 PM  INTERVAL HISTORY: Please see below for problem oriented charting. she returns for treatment follow-up with her daughter, seen prior to treatment She brought with her documented blood pressure from home Majority of her blood pressure is mildly elevated with  systolic blood pressure around 1 50-1 60 She has noticed some intermittent vaginal spotting Denies abdominal pain  REVIEW OF SYSTEMS:   Constitutional: Denies fevers, chills or abnormal weight loss Eyes: Denies blurriness of vision Ears, nose, mouth, throat, and face: Denies mucositis or sore throat Respiratory: Denies cough, dyspnea or wheezes Cardiovascular: Denies palpitation, chest discomfort or lower extremity swelling Gastrointestinal:  Denies nausea, heartburn or change in bowel habits Skin: Denies abnormal skin rashes Lymphatics: Denies new lymphadenopathy or easy bruising Neurological:Denies numbness, tingling or new weaknesses Behavioral/Psych: Mood is stable, no new changes  All other systems were reviewed with the patient and are negative.  I have reviewed the past medical history, past surgical history, social history and family history with the patient and they are unchanged from previous note.  ALLERGIES:  has No Known Allergies.  MEDICATIONS:  Current Outpatient Medications  Medication Sig Dispense Refill   amLODipine (NORVASC) 10 MG tablet Take 1 tablet (10 mg total) by mouth daily. 90 tablet 0   aspirin 81 MG EC tablet Take 1 tablet (81 mg total) by mouth daily. 30 tablet 12   atorvastatin (LIPITOR) 80 MG tablet TAKE 1 TABLET (80 MG TOTAL) BY MOUTH AT BEDTIME. 30 tablet 6   Dulaglutide (TRULICITY) 1.5 TM/2.1RZ SOPN Inject 1.5 mg into the skin once a week. 2 mL 6   glimepiride (AMARYL) 4 MG tablet Take 1 tablet (4 mg total) by mouth daily with breakfast. 30 tablet 6   isosorbide mononitrate (IMDUR) 30 MG 24 hr tablet Take 2 tablets (60 mg total) by mouth daily. 60 tablet 6   lenvatinib 10 mg  daily dose (LENVIMA, 10 MG DAILY DOSE,) capsule Take 1 capsule (10 mg total) by mouth daily. 30 capsule 11   lisinopril (ZESTRIL) 20 MG tablet Take 1.5 tablets (30 mg total) by mouth daily. 90 tablet 0   metFORMIN (GLUCOPHAGE) 500 MG tablet Take 2 tablets (1,000 mg total) by mouth  2 (two) times daily with a meal. 120 tablet 6   metoprolol tartrate (LOPRESSOR) 100 MG tablet TAKE 1 TABLET (100 MG TOTAL) BY MOUTH 2 (TWO) TIMES DAILY. 60 tablet 6   nitroGLYCERIN (NITROSTAT) 0.4 MG SL tablet Place 1 tablet (0.4 mg total) under the tongue every 5 (five) minutes as needed for chest pain. 25 tablet 2   No current facility-administered medications for this visit.   Facility-Administered Medications Ordered in Other Visits  Medication Dose Route Frequency Provider Last Rate Last Admin   sodium chloride flush (NS) 0.9 % injection 10 mL  10 mL Intracatheter PRN Alvy Bimler, Ambrosia Wisnewski, MD   10 mL at 08/30/22 1052    SUMMARY OF ONCOLOGIC HISTORY: Oncology History Overview Note  Pap 12/14/20: adenocarcinoma, HPV negative  HER-2 positive by FISH Her-2 equivocal by [facility]  MSI stable    Papillary serous adenocarcinoma of endometrium ([place])   Initial Diagnosis   Endometrial carcinoma ([place])   01/12/2021 Initial Biopsy   A. ENDOCERVIX, CURETTAGE:  - Adenocarcinoma.  B. ENDOMETRIUM, BIOPSY:  - Adenocarcinoma.  COMMENT:  The differential diagnosis includes high grade serous carcinoma.  Both  specimens have a similar morphology; high grade serous carcinoma more  commonly arises in the endometrium.  Results reported to Dr. Hale Bogus  on 01/15/2021.  Dr. Saralyn Pilar reviewed the case.    01/16/2021 Tumor Marker   Patient's tumor was tested for the following markers: CA-125 Results of the tumor marker test revealed normal value, 10.7   01/25/2021 Imaging   CT C/A/P: 1. Small volume fluid in the endometrial canal with 7 mm hypoattenuating lesion in the myometrium of the anterior fundus. 2. No definite evidence for metastatic disease in the chest, abdomen, or pelvis. Small lymph nodes are noted along both pelvic sidewalls. Attention on follow-up recommended. 3. 5 mm ground-glass opacity peripheral left upper lobe. This is likely related to infection/inflammation and potentially  scar. Attention on surveillance imaging recommended. 4. 5.8 cm lesion posterior lower uterine segment likely a fibroid. Ultrasound exam from 01/28/2010 demonstrated a 5.2 cm lesion in the left aspect of the posterior lower uterine body. 5. Aortic Atherosclerosis (ICD10-I70.0).   01/29/2021 Surgery   TRH/BSO, SLN biopsy left, selective pelvic LND right, omentectomy  Findings: On EUA, 10-12cm enlarged mobile uterus. On intra-abdomina entry, minimal adhesions between the liver and anterior abdomen on the right. Some changes c/w fatty liver on the left. Omentum, stomach, small and large bowel all grossly normal. Bilateral ovaries normal appearing. Uterus with 5-6cm posterior fibroid, otherwise normal appearing. No mapping to right pelvis. On left, mapping to the level of the superior vessel artery, enlarged lymph node noted (likely sentinel) within the upper aspect of the obturator space. In bilateral pelvic basins, multiple enlarged lymph nodes. On the right, enlarged lymph nodes extended superiorly along the common iliac vessels. At the end of surgery, no obvious abdominal or pelvic evidence of disease.   01/29/2021 Pathology Results   A. SENTINEL LYMPH NODE, LEFT INTERNAL ILIAC, BIOPSY:  - Metastatic carcinoma in (1) of (1) lymph node.   B. SENTINEL LYMPH NODE, LEFT OBTURATOR, BIOPSY:  - Metastatic carcinoma in (1) of (1) lymph node.   C. LYMPH  NODES, LEFT PELVIC, DISSECTION:  - Metastatic carcinoma in (1) of (3) lymph nodes.   D. LYMPH NODE, RIGHT EXTERNAL AND COMMON ILIAC, BIOPSY:  - Metastatic carcinoma in (1) of (1) lymph node.   E. UTERUS, CERVIX AND BILATERAL FALLOPIAN TUBES AND OVARIES, TOTAL  HYSTERECTOMY AND BILATERAL SALPINGO-OOPHORECTOMY:  - High grade serous carcinoma of endometrium, with invasion more than  half of the myometrium.  - Tumor invades the stromal connective tissue of the cervix.  - No involvement of uterine serosa or adnexa.  - Lymphovascular invasion is  identified.  - See oncology table.   F. OMENTUM, OMENTECTOMY:  - Omentum, negative for carcinoma.   G. LYMPH NODES, RIGHT PELVIC, DISSECTION:  - Two lymph nodes, negative for carcinoma (0/2).   ONCOLOGY TABLE:   UTERUS, CARCINOMA OR CARCINOSARCOMA: Resection   Procedure: Total hysterectomy and bilateral salpingo-oophorectomy,  Omentectomy, Lymph node sampling  Histologic Type: Serous carcinoma  Histologic Grade: High grade  Myometrial Invasion: > 50%  Uterine Serosa Involvement: Not identified  Cervical Stroma Involvement: Present  Other Tissue/Organ Involvement: Not identified  Peritoneal/Ascitic Fluid: Negative for carcinoma  Lymphovascular Invasion: Present  Regional Lymph Nodes:       Pelvic Lymph Nodes Examined:            2 Sentinel            6 Non-Sentinel            8 Total       Pelvic Lymph Nodes with Metastasis: 4            Macrometastasis: 3            Micrometastasis: 1            Isolated Tumor Cells: 0            Laterality of Lymph Nodes with Tumor: Right (non-sentinel),  Left (sentinel and non-sentinel)            Extracapsular Extension: Present       Para-Aortic Lymph Nodes Examined: Not applicable        Para-Aortic Lymph Nodes with Metastasis: Not applicable  Distant Metastasis:       Distant Site(s) Involved: Omentum: Not involved  Pathologic Stage Classification (pTNM, AJCC 8th Edition): pT2, pN1a  Ancillary Studies: HER2 will be ordered  Additional Findings: Leiomyomata.  Adenomyosis.  Representative Tumor Block: B1  Comment: Dr. Saralyn Pilar reviewed select slides.  (v4.2.0.1)    01/29/2021 Cancer Staging   Staging form: Corpus Uteri - Carcinoma and Carcinosarcoma, AJCC 8th Edition - Clinical stage from 01/29/2021: FIGO Stage IIIC1 (cT1b, cN1a, cM0) - Signed by Lafonda Mosses, MD on 02/02/2021 Histopathologic type: Mixed cell adenocarcinoma Stage prefix: Initial diagnosis Method of lymph node assessment: Other Histologic grade (G):  G3 Histologic grading system: 3 grade system Lymph-vascular invasion (LVI): LVI present/identified, NOS Peritoneal cytology results: Negative Pelvic nodal status: Positive Number of pelvic nodes positive from dissection: 4 Number of pelvic nodes examined during dissection: 8 Para-aortic status: Not assessed Lymph node metastasis: Present Omentectomy performed: Yes Morcellation performed: No   02/19/2021 Echocardiogram    1. Left ventricular ejection fraction, by estimation, is 55 to 60%. The left ventricle has normal function. The left ventricle demonstrates regional wall motion abnormalities (see scoring diagram/findings for description). There is mild left ventricular  hypertrophy. Left ventricular diastolic parameters are consistent with Grade I diastolic dysfunction (impaired relaxation). There is moderate hypokinesis of the left ventricular, basal septal wall and inferior wall. The average left  ventricular global longitudinal strain is -17.9 %. The global longitudinal strain is abnormal.  2. Right ventricular systolic function is normal. The right ventricular size is normal. There is normal pulmonary artery systolic pressure. The estimated right ventricular systolic pressure is 03.5 mmHg.  3. The mitral valve is abnormal. Trivial mitral valve regurgitation.  4. The aortic valve is tricuspid. Aortic valve regurgitation is not visualized.  5. The inferior vena cava is normal in size with greater than 50% respiratory variability, suggesting right atrial pressure of 3 mmHg.   02/20/2021 Procedure   Successful placement of a right IJ approach Power Port with ultrasound and fluoroscopic guidance. The catheter is ready for use.   02/28/2021 - 06/14/2021 Chemotherapy    Patient is on Treatment Plan: UTERINE CARBOPLATIN AUC 6 / PACLITAXEL Q21D       04/20/2021 Imaging   Status post hysterectomy and suspected bilateral salpingo-oophorectomy.   2.2 cm fluid density lesion along the left pelvic  sidewall likely reflects a postoperative seroma.   No findings suspicious for recurrent or metastatic disease.   07/16/2021 - 08/16/2021 Radiation Therapy   Radiation Treatment Dates: 07/16/2021 through 08/16/2021 Site Technique Total Dose (Gy) Dose per Fx (Gy) Completed Fx Beam Energies  Vagina: Pelvis HDR-brachy 30/30 6 5/5 Ir-192        10/01/2021 Imaging   Increasing size of LEFT pelvic lymph nodes, largest approximately 11 mm along the external iliac chain. Given findings and history could consider PET for further evaluation as warranted.   Cystic area along the LEFT pelvic sidewall that was seen previously has improved.   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   Aortic Atherosclerosis (ICD10-I70.0).   12/21/2021 Imaging   1. Enlarging soft tissue masses involving the vaginal cuff. 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 Speculum examination and re-biopsy may be confirmatory. PET-CT may be helpful. 2. New small sub 5 mm perirectal and sigmoid mesocolon nodes. 3. Slight progression of pelvic adenopathy as detailed above. 4. No findings suspicious for abdominal metastatic disease or osseous metastatic disease. 5. Stable age advanced atherosclerotic calcifications involving the aorta and branch vessels and coronary arteries   12/25/2021 Echocardiogram    1. Global longitudinal strain is -16.1% Mild basal inferior hypoinesis. Compared to echo from 02/2021, no change in overall LVEF /regional wall motion. Global longitudinal strain is mildly less negative (-17.9% to -16.1%. Left ventricular ejection fraction, by estimation, is 65 to 70%. The left ventricle has normal function. The left ventricle has no regional wall motion abnormalities. There is mild left ventricular hypertrophy. Left ventricular diastolic parameters are indeterminate.  2. Right ventricular systolic  function is normal. The right ventricular size is normal. There is normal pulmonary artery systolic pressure.  3. Trivial mitral valve regurgitation.  4. The aortic valve is normal in structure. Aortic valve regurgitation is not visualized.  5. The inferior vena cava is normal in size with greater than 50% respiratory variability, suggesting right atrial pressure of 3 mmHg.     01/01/2022 - 01/22/2022 Chemotherapy   Patient is on Treatment Plan : UTERINE SEROUS CARCINOMA Carboplatin + Paclitaxel + Trastuzumab q21d x 6 Cycles / Trastuzumab q21d     03/29/2022 Pathology Results   A.   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   COMMENT:   The carcinoma  has diffuse strong Pax8 immunoreactivity (Mullerian marker) and abnormal overexpression of nuclear p53 and relatively diffuse strong p16 staining.  Estrogen receptor (ER) is weakly reactive  and there is focal strong Napsin A immunoreactivity.  The immunophenotype is most characteristic of serous carcinoma.   The history of prior treatment for endometrial serous carcinoma is noted.   04/12/2022 Imaging   1. Surgical change of prior hysterectomy with increased nodular thickening at the vaginal cuff, suspicious for recurrent disease. Consider further evaluation with PET-CT versus direct tissue sampling. 2. Unchanged prominent/mildly enlarged retroperitoneal and pelvic lymph nodes, nonspecific but in the setting of disease recurrence these would be at least somewhat suspicious for nodal involvement. Consider attention on follow-up imaging. 3. No evidence of metastatic disease within the chest. 4. Left-sided colonic diverticulosis without findings of acute diverticulitis. 5.  Aortic Atherosclerosis (ICD10-I70.0).   04/16/2022 - 08/09/2022 Chemotherapy   Patient is on Treatment Plan : UTERINE Lenvatinib (20) D1-21 +  Pembrolizumab (200) D1 q21d     04/16/2022 -  Chemotherapy   Patient is on Treatment Plan : UTERINE Lenvatinib (20) D1-21 + Pembrolizumab (200) D1 q21d     07/09/2022 Imaging   Mild increase in mild left para-aortic and left iliac lymphadenopathy, consistent with metastatic lymphadenopathy.   Stable nodular thickening of vaginal cuff, consistent with vaginal recurrence.   Colonic diverticulosis, without radiographic evidence of diverticulitis.   Probable tiny calcified gallstone. No radiographic evidence of cholecystitis.   Aortic Atherosclerosis (ICD10-I70.0).       PHYSICAL EXAMINATION: ECOG PERFORMANCE STATUS: 1 - Symptomatic but completely ambulatory  Vitals:   08/30/22 0905  BP: (!) 147/74  Pulse: 78  Resp: 18  Temp: 98 F (36.7 C)  SpO2: 100%   Filed Weights   08/30/22 0905  Weight: 204 lb 3.2 oz (92.6 kg)    GENERAL:alert, no distress and comfortable NEURO: alert & oriented x 3 with fluent speech, no focal motor/sensory deficits  LABORATORY DATA:  I have reviewed the data as listed    Component Value Date/Time   NA 139 08/30/2022 0842   NA 143 10/18/2020 1028   K 3.8 08/30/2022 0842   CL 108 08/30/2022 0842   CO2 24 08/30/2022 0842   GLUCOSE 140 (H) 08/30/2022 0842   BUN 20 08/30/2022 0842   BUN 13 10/18/2020 1028   CREATININE 0.90 08/30/2022 0842   CREATININE 0.79 02/11/2017 0943   CALCIUM 9.6 08/30/2022 0842   PROT 7.2 08/30/2022 0842   PROT 6.7 10/18/2020 1028   ALBUMIN 3.2 (L) 08/30/2022 0842   ALBUMIN 4.1 10/18/2020 1028   AST 20 08/30/2022 0842   ALT 20 08/30/2022 0842   ALKPHOS 82 08/30/2022 0842   BILITOT 0.6 08/30/2022 0842   GFRNONAA >60 08/30/2022 0842   GFRNONAA 83 02/11/2017 0943   GFRAA 70 10/18/2020 1028   GFRAA >89 02/11/2017 0943    No results found for: "SPEP", "UPEP"  Lab Results  Component Value Date   WBC 4.8 08/30/2022   NEUTROABS 2.7 08/30/2022   HGB 10.9 (L) 08/30/2022   HCT 32.2 (L) 08/30/2022   MCV 89.7  08/30/2022   PLT 281 08/30/2022      Chemistry      Component Value Date/Time   NA 139 08/30/2022 0842   NA 143 10/18/2020 1028   K 3.8 08/30/2022 0842   CL 108 08/30/2022 0842   CO2 24 08/30/2022 0842   BUN 20 08/30/2022 0842   BUN 13 10/18/2020 1028   CREATININE 0.90 08/30/2022 1324  CREATININE 0.79 02/11/2017 0943      Component Value Date/Time   CALCIUM 9.6 08/30/2022 0842   ALKPHOS 82 08/30/2022 0842   AST 20 08/30/2022 0842   ALT 20 08/30/2022 0842   BILITOT 0.6 08/30/2022 0340

## 2022-08-30 NOTE — Assessment & Plan Note (Signed)
She tolerated recent treatment well except for elevated blood pressure I am concerned about recurrent vaginal spotting I recommend we proceed with treatment without delay and plan to repeat CT imaging in 2 weeks for further follow-up

## 2022-08-30 NOTE — Assessment & Plan Note (Signed)
Her blood pressure remain elevated I recommend increasing the dose of lisinopril to 30 mg daily

## 2022-08-30 NOTE — Assessment & Plan Note (Signed)
She has multifactorial anemia, component of anemia chronic illness and from prior treatment Observe closely 

## 2022-08-31 ENCOUNTER — Other Ambulatory Visit: Payer: Self-pay

## 2022-09-01 ENCOUNTER — Encounter: Payer: Self-pay | Admitting: Hematology and Oncology

## 2022-09-02 ENCOUNTER — Other Ambulatory Visit: Payer: Self-pay

## 2022-09-02 MED ORDER — LISINOPRIL 10 MG PO TABS
30.0000 mg | ORAL_TABLET | Freq: Every day | ORAL | 0 refills | Status: DC
  Filled 2022-09-02: qty 90, 30d supply, fill #0

## 2022-09-05 ENCOUNTER — Other Ambulatory Visit (HOSPITAL_COMMUNITY): Payer: Self-pay

## 2022-09-08 ENCOUNTER — Other Ambulatory Visit: Payer: Self-pay | Admitting: Family Medicine

## 2022-09-08 DIAGNOSIS — E1159 Type 2 diabetes mellitus with other circulatory complications: Secondary | ICD-10-CM

## 2022-09-09 NOTE — Telephone Encounter (Signed)
Requested Prescriptions  Pending Prescriptions Disp Refills  . TRULICITY 1.5 MM/7.6KG SOPN [Pharmacy Med Name: Trulicity Subcutaneous Solution Pen-injector 1.5 MG/0.5ML]  0    Sig: INJECT 1.5 MG (0.5ML) UNDER THE SKIN ONCE A WEEK     Endocrinology:  Diabetes - GLP-1 Receptor Agonists Passed - 09/08/2022  8:49 PM      Passed - HBA1C is between 0 and 7.9 and within 180 days    HbA1c, POC (controlled diabetic range)  Date Value Ref Range Status  03/18/2022 7.3 (A) 0.0 - 7.0 % Final         Passed - Valid encounter within last 6 months    Recent Outpatient Visits          5 months ago Type 2 diabetes mellitus with other circulatory complication, without long-term current use of insulin ([place])   [place], [place], MD   11 months ago Type 2 diabetes mellitus with other circulatory complication, without long-term current use of insulin ([place])   [facility] [place], [name], MD   1 year ago Post-menopausal bleeding   [place], [place], MD   1 year ago Type 2 diabetes mellitus with other circulatory complication, without long-term current use of insulin ([place])   [place], [name], MD   2 years ago Type 2 diabetes mellitus with other circulatory complication, without long-term current use of insulin ([place])   [facility] [name], MD

## 2022-09-10 ENCOUNTER — Ambulatory Visit (HOSPITAL_COMMUNITY)
Admission: RE | Admit: 2022-09-10 | Discharge: 2022-09-10 | Disposition: A | Discharge status: Home / Self Care | Payer: Medicaid Other | Source: Ambulatory Visit | Attending: Hematology and Oncology | Admitting: Hematology and Oncology

## 2022-09-10 DIAGNOSIS — C541 Malignant neoplasm of endometrium: Secondary | ICD-10-CM | POA: Diagnosis not present

## 2022-09-10 MED ORDER — IOHEXOL 300 MG/ML  SOLN
100.0000 mL | Freq: Once | INTRAMUSCULAR | Status: AC | PRN
  Administered 2022-09-10: 100 mL via INTRAVENOUS

## 2022-09-12 ENCOUNTER — Inpatient Hospital Stay (HOSPITAL_BASED_OUTPATIENT_CLINIC_OR_DEPARTMENT_OTHER): Payer: Medicaid Other | Admitting: Hematology and Oncology

## 2022-09-12 ENCOUNTER — Other Ambulatory Visit: Payer: Self-pay

## 2022-09-12 ENCOUNTER — Encounter: Payer: Self-pay | Admitting: Hematology and Oncology

## 2022-09-12 VITALS — BP 139/69 | HR 80 | Temp 97.4°F | Resp 18 | Ht 62.0 in | Wt 199.6 lb

## 2022-09-12 DIAGNOSIS — N939 Abnormal uterine and vaginal bleeding, unspecified: Secondary | ICD-10-CM

## 2022-09-12 DIAGNOSIS — C541 Malignant neoplasm of endometrium: Secondary | ICD-10-CM

## 2022-09-12 DIAGNOSIS — Z5112 Encounter for antineoplastic immunotherapy: Secondary | ICD-10-CM | POA: Diagnosis not present

## 2022-09-12 DIAGNOSIS — Z7189 Other specified counseling: Secondary | ICD-10-CM | POA: Diagnosis not present

## 2022-09-12 DIAGNOSIS — I10 Essential (primary) hypertension: Secondary | ICD-10-CM | POA: Diagnosis not present

## 2022-09-12 NOTE — Assessment & Plan Note (Signed)
We discussed goals of care While the patient had multiple issues receiving current treatment, overall, she has positive response to therapy We will continue the same indefinitely

## 2022-09-12 NOTE — Assessment & Plan Note (Signed)
Overall, the burden of disease is less I suspect her recent vaginal bleeding is related to the presence of necrotic tumor and her antiplatelet agents The patient is reassured

## 2022-09-12 NOTE — Assessment & Plan Note (Signed)
I have reviewed multiple imaging studies with the patient and her daughter Overall, she has positive response to treatment Her recent vaginal bleeding is likely due to necrotic tumor We will continue treatment indefinitely I plan to repeat imaging study again at the end of the year

## 2022-09-12 NOTE — Assessment & Plan Note (Signed)
Her blood pressure is slightly controlled with aggressive monitoring and dietary modification We will continue similar prescription for now 

## 2022-09-12 NOTE — Progress Notes (Signed)
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[facility]) I have reviewed multiple imaging studies with the patient and her daughter Overall, she has positive response to treatment Her recent vaginal bleeding is likely due to necrotic tumor We will continue treatment indefinitely I plan to repeat imaging study again at the end of the year  Essential hypertension Her blood pressure is slightly controlled with aggressive monitoring and dietary modification We will continue similar prescription for now  Vaginal spotting Overall, the burden of disease is less I suspect her recent vaginal bleeding is related to the presence of necrotic tumor and her antiplatelet agents The patient is reassured  Goals of care, counseling/discussion We discussed goals of care While the patient had multiple issues receiving current treatment, overall, she has positive response to therapy We will continue the same indefinitely  No orders of the defined types were placed in this encounter.   All questions were answered. The patient knows to call the clinic with any problems, questions or concerns. The total time spent in the appointment was 30 minutes encounter with patients including review of chart and various tests results, discussions about plan of care and coordination of care plan   Heath Lark, MD 09/12/2022 10:47 AM  INTERVAL HISTORY: Please see below for problem oriented charting. she returns for treatment follow-up with her daughter Since last time I saw her, she is doing well She had regular bowel movement and her blood pressure is usually within normal range She continues to have irregular vaginal spotting  REVIEW OF SYSTEMS:   Constitutional: Denies fevers, chills or abnormal weight loss Eyes:  Denies blurriness of vision Ears, nose, mouth, throat, and face: Denies mucositis or sore throat Respiratory: Denies cough, dyspnea or wheezes Cardiovascular: Denies palpitation, chest discomfort or lower extremity swelling Gastrointestinal:  Denies nausea, heartburn or change in bowel habits Skin: Denies abnormal skin rashes Lymphatics: Denies new lymphadenopathy or easy bruising Neurological:Denies numbness, tingling or new weaknesses Behavioral/Psych: Mood is stable, no new changes  All other systems were reviewed with the patient and are negative.  I have reviewed the past medical history, past surgical history, social history and family history with the patient and they are unchanged from previous note.  ALLERGIES:  has No Known Allergies.  MEDICATIONS:  Current Outpatient Medications  Medication Sig Dispense Refill   amLODipine (NORVASC) 10 MG tablet Take 1 tablet (10 mg total) by mouth daily. 90 tablet 0   aspirin 81 MG EC tablet Take 1 tablet (81 mg total) by mouth daily. 30 tablet 12   atorvastatin (LIPITOR) 80 MG tablet TAKE 1 TABLET (80 MG TOTAL) BY MOUTH AT BEDTIME. 30 tablet 6   Dulaglutide (TRULICITY) 1.5 EV/0.3JK SOPN Inject 1.5 mg into the skin once a week. 2 mL 6   glimepiride (AMARYL) 4 MG tablet Take 1 tablet (4 mg total) by mouth daily with breakfast. 30 tablet 6   isosorbide mononitrate (IMDUR) 30 MG 24 hr tablet Take 2 tablets (60 mg total) by mouth daily. 60 tablet 6   lenvatinib 10 mg daily dose (LENVIMA, 10 MG DAILY DOSE,) capsule Take 1 capsule (10 mg total) by mouth daily. 30 capsule 11   lisinopril (ZESTRIL) 10 MG tablet Take 3 tablets (30 mg total) by mouth daily. 90 tablet 0   metFORMIN (GLUCOPHAGE) 500 MG tablet Take  2 tablets (1,000 mg total) by mouth 2 (two) times daily with a meal. 120 tablet 6   metoprolol tartrate (LOPRESSOR) 100 MG tablet TAKE 1 TABLET (100 MG TOTAL) BY MOUTH 2 (TWO) TIMES DAILY. 60 tablet 6   nitroGLYCERIN (NITROSTAT) 0.4 MG SL tablet  Place 1 tablet (0.4 mg total) under the tongue every 5 (five) minutes as needed for chest pain. 25 tablet 2   No current facility-administered medications for this visit.    SUMMARY OF ONCOLOGIC HISTORY: Oncology History Overview Note  Pap 12/14/20: adenocarcinoma, HPV negative  HER-2 positive by FISH Her-2 equivocal by [facility]  MSI stable    Papillary serous adenocarcinoma of endometrium ([place])   Initial Diagnosis   Endometrial carcinoma ([place])   01/12/2021 Initial Biopsy   A. ENDOCERVIX, CURETTAGE:  - Adenocarcinoma.  B. ENDOMETRIUM, BIOPSY:  - Adenocarcinoma.  COMMENT:  The differential diagnosis includes high grade serous carcinoma.  Both  specimens have a similar morphology; high grade serous carcinoma more  commonly arises in the endometrium.  Results reported to Dr. Hale Bogus  on 01/15/2021.  Dr. Saralyn Pilar reviewed the case.    01/16/2021 Tumor Marker   Patient's tumor was tested for the following markers: CA-125 Results of the tumor marker test revealed normal value, 10.7   01/25/2021 Imaging   CT C/A/P: 1. Small volume fluid in the endometrial canal with 7 mm hypoattenuating lesion in the myometrium of the anterior fundus. 2. No definite evidence for metastatic disease in the chest, abdomen, or pelvis. Small lymph nodes are noted along both pelvic sidewalls. Attention on follow-up recommended. 3. 5 mm ground-glass opacity peripheral left upper lobe. This is likely related to infection/inflammation and potentially scar. Attention on surveillance imaging recommended. 4. 5.8 cm lesion posterior lower uterine segment likely a fibroid. Ultrasound exam from 01/28/2010 demonstrated a 5.2 cm lesion in the left aspect of the posterior lower uterine body. 5. Aortic Atherosclerosis (ICD10-I70.0).   01/29/2021 Surgery   TRH/BSO, SLN biopsy left, selective pelvic LND right, omentectomy  Findings: On EUA, 10-12cm enlarged mobile uterus. On intra-abdomina entry, minimal adhesions  between the liver and anterior abdomen on the right. Some changes c/w fatty liver on the left. Omentum, stomach, small and large bowel all grossly normal. Bilateral ovaries normal appearing. Uterus with 5-6cm posterior fibroid, otherwise normal appearing. No mapping to right pelvis. On left, mapping to the level of the superior vessel artery, enlarged lymph node noted (likely sentinel) within the upper aspect of the obturator space. In bilateral pelvic basins, multiple enlarged lymph nodes. On the right, enlarged lymph nodes extended superiorly along the common iliac vessels. At the end of surgery, no obvious abdominal or pelvic evidence of disease.   01/29/2021 Pathology Results   A. SENTINEL LYMPH NODE, LEFT INTERNAL ILIAC, BIOPSY:  - Metastatic carcinoma in (1) of (1) lymph node.   B. SENTINEL LYMPH NODE, LEFT OBTURATOR, BIOPSY:  - Metastatic carcinoma in (1) of (1) lymph node.   C. LYMPH NODES, LEFT PELVIC, DISSECTION:  - Metastatic carcinoma in (1) of (3) lymph nodes.   D. LYMPH NODE, RIGHT EXTERNAL AND COMMON ILIAC, BIOPSY:  - Metastatic carcinoma in (1) of (1) lymph node.   E. UTERUS, CERVIX AND BILATERAL FALLOPIAN TUBES AND OVARIES, TOTAL  HYSTERECTOMY AND BILATERAL SALPINGO-OOPHORECTOMY:  - High grade serous carcinoma of endometrium, with invasion more than  half of the myometrium.  - Tumor invades the stromal connective tissue of the cervix.  - No involvement of uterine serosa or adnexa.  -  Lymphovascular invasion is identified.  - See oncology table.   F. OMENTUM, OMENTECTOMY:  - Omentum, negative for carcinoma.   G. LYMPH NODES, RIGHT PELVIC, DISSECTION:  - Two lymph nodes, negative for carcinoma (0/2).   ONCOLOGY TABLE:   UTERUS, CARCINOMA OR CARCINOSARCOMA: Resection   Procedure: Total hysterectomy and bilateral salpingo-oophorectomy,  Omentectomy, Lymph node sampling  Histologic Type: Serous carcinoma  Histologic Grade: High grade  Myometrial Invasion: > 50%   Uterine Serosa Involvement: Not identified  Cervical Stroma Involvement: Present  Other Tissue/Organ Involvement: Not identified  Peritoneal/Ascitic Fluid: Negative for carcinoma  Lymphovascular Invasion: Present  Regional Lymph Nodes:       Pelvic Lymph Nodes Examined:            2 Sentinel            6 Non-Sentinel            8 Total       Pelvic Lymph Nodes with Metastasis: 4            Macrometastasis: 3            Micrometastasis: 1            Isolated Tumor Cells: 0            Laterality of Lymph Nodes with Tumor: Right (non-sentinel),  Left (sentinel and non-sentinel)            Extracapsular Extension: Present       Para-Aortic Lymph Nodes Examined: Not applicable        Para-Aortic Lymph Nodes with Metastasis: Not applicable  Distant Metastasis:       Distant Site(s) Involved: Omentum: Not involved  Pathologic Stage Classification (pTNM, AJCC 8th Edition): pT2, pN1a  Ancillary Studies: HER2 will be ordered  Additional Findings: Leiomyomata.  Adenomyosis.  Representative Tumor Block: B1  Comment: Dr. Saralyn Pilar reviewed select slides.  (v4.2.0.1)    01/29/2021 Cancer Staging   Staging form: Corpus Uteri - Carcinoma and Carcinosarcoma, AJCC 8th Edition - Clinical stage from 01/29/2021: FIGO Stage IIIC1 (cT1b, cN1a, cM0) - Signed by Lafonda Mosses, MD on 02/02/2021 Histopathologic type: Mixed cell adenocarcinoma Stage prefix: Initial diagnosis Method of lymph node assessment: Other Histologic grade (G): G3 Histologic grading system: 3 grade system Lymph-vascular invasion (LVI): LVI present/identified, NOS Peritoneal cytology results: Negative Pelvic nodal status: Positive Number of pelvic nodes positive from dissection: 4 Number of pelvic nodes examined during dissection: 8 Para-aortic status: Not assessed Lymph node metastasis: Present Omentectomy performed: Yes Morcellation performed: No   02/19/2021 Echocardiogram    1. Left ventricular ejection fraction, by  estimation, is 55 to 60%. The left ventricle has normal function. The left ventricle demonstrates regional wall motion abnormalities (see scoring diagram/findings for description). There is mild left ventricular  hypertrophy. Left ventricular diastolic parameters are consistent with Grade I diastolic dysfunction (impaired relaxation). There is moderate hypokinesis of the left ventricular, basal septal wall and inferior wall. The average left ventricular global longitudinal strain is -17.9 %. The global longitudinal strain is abnormal.  2. Right ventricular systolic function is normal. The right ventricular size is normal. There is normal pulmonary artery systolic pressure. The estimated right ventricular systolic pressure is 93.2 mmHg.  3. The mitral valve is abnormal. Trivial mitral valve regurgitation.  4. The aortic valve is tricuspid. Aortic valve regurgitation is not visualized.  5. The inferior vena cava is normal in size with greater than 50% respiratory variability, suggesting right atrial pressure of 3 mmHg.  02/20/2021 Procedure   Successful placement of a right IJ approach Power Port with ultrasound and fluoroscopic guidance. The catheter is ready for use.   02/28/2021 - 06/14/2021 Chemotherapy    Patient is on Treatment Plan: UTERINE CARBOPLATIN AUC 6 / PACLITAXEL Q21D       04/20/2021 Imaging   Status post hysterectomy and suspected bilateral salpingo-oophorectomy.   2.2 cm fluid density lesion along the left pelvic sidewall likely reflects a postoperative seroma.   No findings suspicious for recurrent or metastatic disease.   07/16/2021 - 08/16/2021 Radiation Therapy   Radiation Treatment Dates: 07/16/2021 through 08/16/2021 Site Technique Total Dose (Gy) Dose per Fx (Gy) Completed Fx Beam Energies  Vagina: Pelvis HDR-brachy 30/30 6 5/5 Ir-192        10/01/2021 Imaging   Increasing size of LEFT pelvic lymph nodes, largest approximately 11 mm along the external iliac chain. Given  findings and history could consider PET for further evaluation as warranted.   Cystic area along the LEFT pelvic sidewall that was seen previously has improved.   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   Aortic Atherosclerosis (ICD10-I70.0).   12/21/2021 Imaging   1. Enlarging soft tissue masses involving the vaginal cuff. 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 Speculum examination and re-biopsy may be confirmatory. PET-CT may be helpful. 2. New small sub 5 mm perirectal and sigmoid mesocolon nodes. 3. Slight progression of pelvic adenopathy as detailed above. 4. No findings suspicious for abdominal metastatic disease or osseous metastatic disease. 5. Stable age advanced atherosclerotic calcifications involving the aorta and branch vessels and coronary arteries   12/25/2021 Echocardiogram    1. Global longitudinal strain is -16.1% Mild basal inferior hypoinesis. Compared to echo from 02/2021, no change in overall LVEF /regional wall motion. Global longitudinal strain is mildly less negative (-17.9% to -16.1%. Left ventricular ejection fraction, by estimation, is 65 to 70%. The left ventricle has normal function. The left ventricle has no regional wall motion abnormalities. There is mild left ventricular hypertrophy. Left ventricular diastolic parameters are indeterminate.  2. Right ventricular systolic function is normal. The right ventricular size is normal. There is normal pulmonary artery systolic pressure.  3. Trivial mitral valve regurgitation.  4. The aortic valve is normal in structure. Aortic valve regurgitation is not visualized.  5. The inferior vena cava is normal in size with greater than 50% respiratory variability, suggesting right atrial pressure of 3 mmHg.     01/01/2022 - 01/22/2022 Chemotherapy   Patient is on Treatment Plan : UTERINE SEROUS CARCINOMA  Carboplatin + Paclitaxel + Trastuzumab q21d x 6 Cycles / Trastuzumab q21d     03/29/2022 Pathology Results   A.   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   COMMENT:   The carcinoma has diffuse strong Pax8 immunoreactivity (Mullerian marker) and abnormal overexpression of nuclear p53 and relatively diffuse strong p16 staining.  Estrogen receptor (ER) is weakly reactive  and there is focal strong Napsin A immunoreactivity.  The immunophenotype is most characteristic of serous carcinoma.   The history of prior treatment for endometrial serous carcinoma is noted.   04/12/2022 Imaging   1. Surgical change of prior hysterectomy with increased nodular thickening at the vaginal cuff, suspicious for recurrent disease. Consider further evaluation with PET-CT versus direct tissue sampling. 2. Unchanged  prominent/mildly enlarged retroperitoneal and pelvic lymph nodes, nonspecific but in the setting of disease recurrence these would be at least somewhat suspicious for nodal involvement. Consider attention on follow-up imaging. 3. No evidence of metastatic disease within the chest. 4. Left-sided colonic diverticulosis without findings of acute diverticulitis. 5.  Aortic Atherosclerosis (ICD10-I70.0).   04/16/2022 - 08/09/2022 Chemotherapy   Patient is on Treatment Plan : UTERINE Lenvatinib (20) D1-21 + Pembrolizumab (200) D1 q21d     04/16/2022 -  Chemotherapy   Patient is on Treatment Plan : UTERINE Lenvatinib (20) D1-21 + Pembrolizumab (200) D1 q21d     07/09/2022 Imaging   Mild increase in mild left para-aortic and left iliac lymphadenopathy, consistent with metastatic lymphadenopathy.   Stable nodular thickening of vaginal cuff, consistent with vaginal recurrence.   Colonic diverticulosis, without radiographic evidence of diverticulitis.   Probable tiny calcified  gallstone. No radiographic evidence of cholecystitis.   Aortic Atherosclerosis (ICD10-I70.0).     09/12/2022 Imaging   1. Mild decrease in thickening along the vaginal cuff. 2. LEFT iliac and LEFT periaortic retroperitoneal adenopathy is stable to slightly decreased. 3. No evidence of new lymphadenopathy or progressive metastatic uterine carcinoma.        PHYSICAL EXAMINATION: ECOG PERFORMANCE STATUS: 1 - Symptomatic but completely ambulatory  Vitals:   09/12/22 0950  BP: 139/69  Pulse: 80  Resp: 18  Temp: (!) 97.4 F (36.3 C)  SpO2: 100%   Filed Weights   09/12/22 0950  Weight: 199 lb 9.6 oz (90.5 kg)    GENERAL:alert, no distress and comfortable NEURO: alert & oriented x 3 with fluent speech, no focal motor/sensory deficits  LABORATORY DATA:  I have reviewed the data as listed    Component Value Date/Time   NA 139 08/30/2022 0842   NA 143 10/18/2020 1028   K 3.8 08/30/2022 0842   CL 108 08/30/2022 0842   CO2 24 08/30/2022 0842   GLUCOSE 140 (H) 08/30/2022 0842   BUN 20 08/30/2022 0842   BUN 13 10/18/2020 1028   CREATININE 0.90 08/30/2022 0842   CREATININE 0.79 02/11/2017 0943   CALCIUM 9.6 08/30/2022 0842   PROT 7.2 08/30/2022 0842   PROT 6.7 10/18/2020 1028   ALBUMIN 3.2 (L) 08/30/2022 0842   ALBUMIN 4.1 10/18/2020 1028   AST 20 08/30/2022 0842   ALT 20 08/30/2022 0842   ALKPHOS 82 08/30/2022 0842   BILITOT 0.6 08/30/2022 0842   GFRNONAA >60 08/30/2022 0842   GFRNONAA 83 02/11/2017 0943   GFRAA 70 10/18/2020 1028   GFRAA >89 02/11/2017 0943    No results found for: "SPEP", "UPEP"  Lab Results  Component Value Date   WBC 4.8 08/30/2022   NEUTROABS 2.7 08/30/2022   HGB 10.9 (L) 08/30/2022   HCT 32.2 (L) 08/30/2022   MCV 89.7 08/30/2022   PLT 281 08/30/2022      Chemistry      Component Value Date/Time   NA 139 08/30/2022 0842   NA 143 10/18/2020 1028   K 3.8 08/30/2022 0842   CL 108 08/30/2022 0842   CO2 24 08/30/2022 0842   BUN 20  08/30/2022 0842   BUN 13 10/18/2020 1028   CREATININE 0.90 08/30/2022 0842   CREATININE 0.79 02/11/2017 0943      Component Value Date/Time   CALCIUM 9.6 08/30/2022 0842   ALKPHOS 82 08/30/2022 0842   AST 20 08/30/2022 0842   ALT 20 08/30/2022 0842   BILITOT 0.6 08/30/2022 3300  RADIOGRAPHIC STUDIES: I have reviewed CT imaging with the patient I have personally reviewed the radiological images as listed and agreed with the findings in the report. CT ABDOMEN PELVIS W CONTRAST  Result Date: 09/12/2022 CLINICAL DATA:  Uterine/cervical carcinoma. Assess treatment response. * Tracking Code: BO * EXAM: CT ABDOMEN AND PELVIS WITH CONTRAST TECHNIQUE: Multidetector CT imaging of the abdomen and pelvis was performed using the standard protocol following bolus administration of intravenous contrast. RADIATION DOSE REDUCTION: This exam was performed according to the departmental dose-optimization program which includes automated exposure control, adjustment of the mA and/or kV according to patient size and/or use of iterative reconstruction technique. CONTRAST:  185m OMNIPAQUE IOHEXOL 300 MG/ML  SOLN COMPARISON:  None Available. FINDINGS: Lower chest: Lung bases are clear. Hepatobiliary: No focal hepatic lesion. No biliary duct dilatation. Common bile duct is normal. Pancreas: Pancreas is normal. No ductal dilatation. No pancreatic inflammation. Spleen: Normal spleen Adrenals/urinary tract: Adrenal glands and kidneys are normal. The ureters and bladder normal. Stomach/Bowel: Stomach, small bowel, appendix, and cecum are normal. The colon and rectosigmoid colon are normal. Vascular/Lymphatic: Abdominal aorta is normal caliber with atherosclerotic calcification. See reproductive section for lymph node description. Reproductive: Post hysterectomy. Thickening along the vaginal cuff is less prominent. For example measured at the same level the LEFT aspect of the vaginal cuff measures 23 mm compared to 26 mm  on prior. Nodular focus more midline measuring 9 mm decreased from 15 mm (image 81/2). No pelvic sidewall abnormality. LEFT external iliac lymph node measuring 10 mm (image 75/2) compares to 12 mm. Presumed lymphoid tissue in the in the crotch of the aortic bifurcation measures 17 mm compared to 18 mm. Small lymph node LEFT of the aorta the level kidneys measures 12 mm compared to 11 mm. Similar lymph node in the retroperitoneum LEFT aorta at the level the renal veins measures 12 mm compared to 14 mm. Other: No free fluid. Musculoskeletal: No aggressive osseous lesion. IMPRESSION: 1. Mild decrease in thickening along the vaginal cuff. 2. LEFT iliac and LEFT periaortic retroperitoneal adenopathy is stable to slightly decreased. 3. No evidence of new lymphadenopathy or progressive metastatic uterine carcinoma. Electronically Signed   By: SSuzy BouchardM.D.   On: 09/12/2022 08:09

## 2022-09-13 ENCOUNTER — Other Ambulatory Visit: Payer: Self-pay

## 2022-09-16 ENCOUNTER — Other Ambulatory Visit: Payer: Self-pay

## 2022-09-20 ENCOUNTER — Ambulatory Visit: Payer: Medicaid Other | Admitting: Hematology and Oncology

## 2022-09-20 ENCOUNTER — Inpatient Hospital Stay: Payer: Medicaid Other | Attending: Gynecologic Oncology

## 2022-09-20 ENCOUNTER — Inpatient Hospital Stay: Payer: Medicaid Other

## 2022-09-20 VITALS — BP 140/70 | HR 80 | Temp 97.9°F | Resp 18 | Wt 200.5 lb

## 2022-09-20 DIAGNOSIS — C541 Malignant neoplasm of endometrium: Secondary | ICD-10-CM | POA: Insufficient documentation

## 2022-09-20 DIAGNOSIS — Z5112 Encounter for antineoplastic immunotherapy: Secondary | ICD-10-CM | POA: Insufficient documentation

## 2022-09-20 LAB — CBC WITH DIFFERENTIAL (CANCER CENTER ONLY)
Abs Immature Granulocytes: 0.02 10*3/uL (ref 0.00–0.07)
Basophils Absolute: 0 10*3/uL (ref 0.0–0.1)
Basophils Relative: 0 %
Eosinophils Absolute: 0 10*3/uL (ref 0.0–0.5)
Eosinophils Relative: 1 %
HCT: 32.8 % — ABNORMAL LOW (ref 36.0–46.0)
Hemoglobin: 11.2 g/dL — ABNORMAL LOW (ref 12.0–15.0)
Immature Granulocytes: 0 %
Lymphocytes Relative: 32 %
Lymphs Abs: 1.5 10*3/uL (ref 0.7–4.0)
MCH: 30.6 pg (ref 26.0–34.0)
MCHC: 34.1 g/dL (ref 30.0–36.0)
MCV: 89.6 fL (ref 80.0–100.0)
Monocytes Absolute: 0.5 10*3/uL (ref 0.1–1.0)
Monocytes Relative: 11 %
Neutro Abs: 2.6 10*3/uL (ref 1.7–7.7)
Neutrophils Relative %: 56 %
Platelet Count: 282 10*3/uL (ref 150–400)
RBC: 3.66 MIL/uL — ABNORMAL LOW (ref 3.87–5.11)
RDW: 14.2 % (ref 11.5–15.5)
WBC Count: 4.7 10*3/uL (ref 4.0–10.5)
nRBC: 0 % (ref 0.0–0.2)

## 2022-09-20 LAB — CMP (CANCER CENTER ONLY)
ALT: 15 U/L (ref 0–44)
AST: 15 U/L (ref 15–41)
Albumin: 3.4 g/dL — ABNORMAL LOW (ref 3.5–5.0)
Alkaline Phosphatase: 79 U/L (ref 38–126)
Anion gap: 8 (ref 5–15)
BUN: 18 mg/dL (ref 8–23)
CO2: 26 mmol/L (ref 22–32)
Calcium: 9.1 mg/dL (ref 8.9–10.3)
Chloride: 105 mmol/L (ref 98–111)
Creatinine: 1 mg/dL (ref 0.44–1.00)
GFR, Estimated: >60 mL/min (ref >60)
Glucose, Bld: 201 mg/dL — ABNORMAL HIGH (ref 70–99)
Potassium: 3.6 mmol/L (ref 3.5–5.1)
Sodium: 139 mmol/L (ref 135–145)
Total Bilirubin: 0.4 mg/dL (ref 0.3–1.2)
Total Protein: 7.1 g/dL (ref 6.5–8.1)

## 2022-09-20 MED ORDER — SODIUM CHLORIDE 0.9 % IV SOLN: Freq: Once | INTRAVENOUS | Status: AC

## 2022-09-20 MED ORDER — HEPARIN SOD (PORK) LOCK FLUSH 100 UNIT/ML IV SOLN
500.0000 [IU] | Freq: Once | INTRAVENOUS | Status: AC | PRN
  Administered 2022-09-20: 500 [IU]

## 2022-09-20 MED ORDER — SODIUM CHLORIDE 0.9% FLUSH
10.0000 mL | Freq: Once | INTRAVENOUS | Status: AC
  Administered 2022-09-20: 10 mL

## 2022-09-20 MED ORDER — SODIUM CHLORIDE 0.9 % IV SOLN
200.0000 mg | Freq: Once | INTRAVENOUS | Status: AC
  Administered 2022-09-20: 200 mg via INTRAVENOUS
  Filled 2022-09-20: qty 200

## 2022-09-20 MED ORDER — SODIUM CHLORIDE 0.9% FLUSH
10.0000 mL | INTRAVENOUS | Status: DC | PRN
  Administered 2022-09-20: 10 mL

## 2022-09-20 NOTE — Patient Instructions (Signed)
[name]   Discharge Instructions: Thank you for choosing [facility] to provide your [name] and hematology care.   If you have a lab appointment with the [facility], please go directly to the [facility] and check in at the registration area.   Wear comfortable clothing and clothing appropriate for easy access to any Portacath or PICC line.   We strive to give you quality time with your provider. You may need to reschedule your appointment if you arrive late (15 or more minutes).  Arriving late affects you and other patients whose appointments are after yours.  Also, if you miss three or more appointments without notifying the office, you may be dismissed from the clinic at the provider's discretion.      For prescription refill requests, have your pharmacy contact our office and allow 72 hours for refills to be completed.    Today you received the following chemotherapy and/or immunotherapy agents: Pembrolizumab Beryle Flock)      To help prevent nausea and vomiting after your treatment, we encourage you to take your nausea medication as directed.  BELOW ARE SYMPTOMS THAT SHOULD BE REPORTED IMMEDIATELY: *FEVER GREATER THAN 100.4 F (38 C) OR HIGHER *CHILLS OR SWEATING *NAUSEA AND VOMITING THAT IS NOT CONTROLLED WITH YOUR NAUSEA MEDICATION *UNUSUAL SHORTNESS OF BREATH *UNUSUAL BRUISING OR BLEEDING *URINARY PROBLEMS (pain or burning when urinating, or frequent urination) *BOWEL PROBLEMS (unusual diarrhea, constipation, pain near the anus) TENDERNESS IN MOUTH AND THROAT WITH OR WITHOUT PRESENCE OF ULCERS (sore throat, sores in mouth, or a toothache) UNUSUAL RASH, SWELLING OR PAIN  UNUSUAL VAGINAL DISCHARGE OR ITCHING   Items with * indicate a potential emergency and should be followed up as soon as possible or go to the Emergency Department if any problems should occur.  Please show the CHEMOTHERAPY ALERT CARD or IMMUNOTHERAPY ALERT  CARD at check-in to the Emergency Department and triage nurse.  Should you have questions after your visit or need to cancel or reschedule your appointment, please contact [facility]  Dept: [phone]  and follow the prompts.  Office hours are 8:00 a.m. to 4:30 p.m. Monday - Friday. Please note that voicemails left after 4:00 p.m. may not be returned until the following business day.  We are closed weekends and major holidays. You have access to a nurse at all times for urgent questions. Please call the main number to the clinic Dept: [phone] and follow the prompts.   For any non-urgent questions, you may also contact your provider using MyChart. We now offer e-Visits for anyone 39 and older to request care online for non-urgent symptoms. For details visit [email or web address].   Also download the MyChart app! Go to the app store, search "MyChart", open the app, select [facility], and log in with your MyChart username and password.  Masks are optional in the cancer centers. If you would like for your care team to wear a mask while they are taking care of you, please let them know. For doctor visits, patients may have with them one support person who is at least 63 years old. At this time, visitors are not allowed in the infusion area.  Pembrolizumab Injection What is this medication? PEMBROLIZUMAB (PEM broe LIZ ue mab) treats some types of cancer. It works by helping your immune system slow or stop the spread of cancer cells. It is a monoclonal antibody. This medicine may be used for other purposes; ask your health  care provider or pharmacist if you have questions. COMMON BRAND NAME(S): Keytruda What should I tell my care team before I take this medication? They need to know if you have any of these conditions: Allogeneic stem cell transplant (uses someone else's stem cells) Autoimmune diseases, such as Crohn disease, ulcerative colitis, lupus History of  chest radiation Nervous system problems, such as Guillain-Barre syndrome, myasthenia gravis Organ transplant An unusual or allergic reaction to pembrolizumab, other medications, foods, dyes, or preservatives Pregnant or trying to get pregnant Breast-feeding How should I use this medication? This medication is injected into a vein. It is given by your care team in a hospital or clinic setting. A special MedGuide will be given to you before each treatment. Be sure to read this information carefully each time. Talk to your care team about the use of this medication in children. While it may be prescribed for children as young as 6 months for selected conditions, precautions do apply. Overdosage: If you think you have taken too much of this medicine contact a poison control center or emergency room at once. NOTE: This medicine is only for you. Do not share this medicine with others. What if I miss a dose? Keep appointments for follow-up doses. It is important not to miss your dose. Call your care team if you are unable to keep an appointment. What may interact with this medication? Interactions have not been studied. This list may not describe all possible interactions. Give your health care provider a list of all the medicines, herbs, non-prescription drugs, or dietary supplements you use. Also tell them if you smoke, drink alcohol, or use illegal drugs. Some items may interact with your medicine. What should I watch for while using this medication? Your condition will be monitored carefully while you are receiving this medication. You may need blood work while taking this medication. This medication may cause serious skin reactions. They can happen weeks to months after starting the medication. Contact your care team right away if you notice fevers or flu-like symptoms with a rash. The rash may be red or purple and then turn into blisters or peeling of the skin. You may also notice a red rash with  swelling of the face, lips, or lymph nodes in your neck or under your arms. Tell your care team right away if you have any change in your eyesight. Talk to your care team if you may be pregnant. Serious birth defects can occur if you take this medication during pregnancy and for 4 months after the last dose. You will need a negative pregnancy test before starting this medication. Contraception is recommended while taking this medication and for 4 months after the last dose. Your care team can help you find the option that works for you. Do not breastfeed while taking this medication and for 4 months after the last dose. What side effects may I notice from receiving this medication? Side effects that you should report to your care team as soon as possible: Allergic reactions--skin rash, itching, hives, swelling of the face, lips, tongue, or throat Dry cough, shortness of breath or trouble breathing Eye pain, redness, irritation, or discharge with blurry or decreased vision Heart muscle inflammation--unusual weakness or fatigue, shortness of breath, chest pain, fast or irregular heartbeat, dizziness, swelling of the ankles, feet, or hands Hormone gland problems--headache, sensitivity to light, unusual weakness or fatigue, dizziness, fast or irregular heartbeat, increased sensitivity to cold or heat, excessive sweating, constipation, hair loss, increased thirst or  amount of urine, tremors or shaking, irritability Infusion reactions--chest pain, shortness of breath or trouble breathing, feeling faint or lightheaded Kidney injury (glomerulonephritis)--decrease in the amount of urine, red or dark brown urine, foamy or bubbly urine, swelling of the ankles, hands, or feet Liver injury--right upper belly pain, loss of appetite, nausea, light-colored stool, dark yellow or brown urine, yellowing skin or eyes, unusual weakness or fatigue Pain, tingling, or numbness in the hands or feet, muscle weakness, change in  vision, confusion or trouble speaking, loss of balance or coordination, trouble walking, seizures Rash, fever, and swollen lymph nodes Redness, blistering, peeling, or loosening of the skin, including inside the mouth Sudden or severe stomach pain, bloody diarrhea, fever, nausea, vomiting Side effects that usually do not require medical attention (report to your care team if they continue or are bothersome): Bone, joint, or muscle pain Diarrhea Fatigue Loss of appetite Nausea Skin rash This list may not describe all possible side effects. Call your doctor for medical advice about side effects. You may report side effects to FDA at 1-800-FDA-1088. Where should I keep my medication? This medication is given in a hospital or clinic. It will not be stored at home. NOTE: This sheet is a summary. It may not cover all possible information. If you have questions about this medicine, talk to your doctor, pharmacist, or health care provider.  2023 [employer]/Gold Standard (2022-03-25 00:00:00)

## 2022-09-23 ENCOUNTER — Other Ambulatory Visit (HOSPITAL_COMMUNITY): Payer: Self-pay

## 2022-10-02 ENCOUNTER — Other Ambulatory Visit (HOSPITAL_COMMUNITY): Payer: Self-pay

## 2022-10-11 ENCOUNTER — Inpatient Hospital Stay: Payer: Medicaid Other

## 2022-10-11 ENCOUNTER — Inpatient Hospital Stay (HOSPITAL_BASED_OUTPATIENT_CLINIC_OR_DEPARTMENT_OTHER): Payer: Medicaid Other | Admitting: Hematology and Oncology

## 2022-10-11 ENCOUNTER — Encounter: Payer: Self-pay | Admitting: Hematology and Oncology

## 2022-10-11 VITALS — BP 150/86 | HR 63 | Resp 18

## 2022-10-11 VITALS — BP 154/96 | HR 65 | Resp 18 | Ht 62.0 in | Wt 196.6 lb

## 2022-10-11 DIAGNOSIS — R21 Rash and other nonspecific skin eruption: Secondary | ICD-10-CM

## 2022-10-11 DIAGNOSIS — Z5112 Encounter for antineoplastic immunotherapy: Secondary | ICD-10-CM | POA: Diagnosis not present

## 2022-10-11 DIAGNOSIS — E119 Type 2 diabetes mellitus without complications: Secondary | ICD-10-CM

## 2022-10-11 DIAGNOSIS — C541 Malignant neoplasm of endometrium: Secondary | ICD-10-CM | POA: Diagnosis not present

## 2022-10-11 LAB — CBC WITH DIFFERENTIAL (CANCER CENTER ONLY)
Abs Immature Granulocytes: 0.01 10*3/uL (ref 0.00–0.07)
Basophils Absolute: 0 10*3/uL (ref 0.0–0.1)
Basophils Relative: 1 %
Eosinophils Absolute: 0.1 10*3/uL (ref 0.0–0.5)
Eosinophils Relative: 2 %
HCT: 34.6 % — ABNORMAL LOW (ref 36.0–46.0)
Hemoglobin: 11.7 g/dL — ABNORMAL LOW (ref 12.0–15.0)
Immature Granulocytes: 0 %
Lymphocytes Relative: 31 %
Lymphs Abs: 1.4 10*3/uL (ref 0.7–4.0)
MCH: 30.1 pg (ref 26.0–34.0)
MCHC: 33.8 g/dL (ref 30.0–36.0)
MCV: 88.9 fL (ref 80.0–100.0)
Monocytes Absolute: 0.6 10*3/uL (ref 0.1–1.0)
Monocytes Relative: 14 %
Neutro Abs: 2.3 10*3/uL (ref 1.7–7.7)
Neutrophils Relative %: 52 %
Platelet Count: 278 10*3/uL (ref 150–400)
RBC: 3.89 MIL/uL (ref 3.87–5.11)
RDW: 14.5 % (ref 11.5–15.5)
WBC Count: 4.4 10*3/uL (ref 4.0–10.5)
nRBC: 0 % (ref 0.0–0.2)

## 2022-10-11 LAB — CMP (CANCER CENTER ONLY)
ALT: 13 U/L (ref 0–44)
AST: 15 U/L (ref 15–41)
Albumin: 3.6 g/dL (ref 3.5–5.0)
Alkaline Phosphatase: 73 U/L (ref 38–126)
Anion gap: 8 (ref 5–15)
BUN: 19 mg/dL (ref 8–23)
CO2: 25 mmol/L (ref 22–32)
Calcium: 9.5 mg/dL (ref 8.9–10.3)
Chloride: 106 mmol/L (ref 98–111)
Creatinine: 0.99 mg/dL (ref 0.44–1.00)
GFR, Estimated: >60 mL/min (ref >60)
Glucose, Bld: 187 mg/dL — ABNORMAL HIGH (ref 70–99)
Potassium: 3.4 mmol/L — ABNORMAL LOW (ref 3.5–5.1)
Sodium: 139 mmol/L (ref 135–145)
Total Bilirubin: 0.4 mg/dL (ref 0.3–1.2)
Total Protein: 7.4 g/dL (ref 6.5–8.1)

## 2022-10-11 LAB — TSH: TSH: 3.181 u[IU]/mL (ref 0.350–4.500)

## 2022-10-11 MED ORDER — SODIUM CHLORIDE 0.9 % IV SOLN
200.0000 mg | Freq: Once | INTRAVENOUS | Status: AC
  Administered 2022-10-11: 200 mg via INTRAVENOUS
  Filled 2022-10-11: qty 200

## 2022-10-11 MED ORDER — SODIUM CHLORIDE 0.9% FLUSH
10.0000 mL | INTRAVENOUS | Status: DC | PRN
  Administered 2022-10-11: 10 mL

## 2022-10-11 MED ORDER — SODIUM CHLORIDE 0.9 % IV SOLN: Freq: Once | INTRAVENOUS | Status: AC

## 2022-10-11 MED ORDER — HEPARIN SOD (PORK) LOCK FLUSH 100 UNIT/ML IV SOLN
500.0000 [IU] | Freq: Once | INTRAVENOUS | Status: AC | PRN
  Administered 2022-10-11: 500 [IU]

## 2022-10-11 MED ORDER — SODIUM CHLORIDE 0.9% FLUSH
10.0000 mL | Freq: Once | INTRAVENOUS | Status: AC
  Administered 2022-10-11: 10 mL

## 2022-10-11 NOTE — Assessment & Plan Note (Signed)
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

## 2022-10-11 NOTE — Assessment & Plan Note (Signed)
We discussed importance of dietary modification while on treatment 

## 2022-10-11 NOTE — Assessment & Plan Note (Signed)
This is likely due to contact dermatitis She avoided using the new soap I recommend over-the-counter antihistamines as needed

## 2022-10-11 NOTE — Progress Notes (Signed)
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   All questions were answered. The patient knows to call the clinic with any problems, questions or concerns. The total time spent in the appointment was 20 minutes encounter with patients including review of chart and various tests results, discussions about plan of care and coordination of care plan   Heath Lark, MD 10/11/2022 11:52 AM  INTERVAL HISTORY: Please see below for problem oriented charting. she returns for treatment follow-up on Lenvima and pembrolizumab for recurrent uterine cancer She continues to have abnormal brown discharge intermittently No other new side effects from treatment so far She developed skin rash around her neck which she attributed to new soap  REVIEW OF SYSTEMS:   Constitutional: Denies fevers, chills or abnormal weight loss Eyes: Denies blurriness of vision Ears, nose, mouth, throat, and face: Denies mucositis or sore throat Respiratory: Denies cough, dyspnea or wheezes Cardiovascular: Denies palpitation, chest discomfort or lower extremity swelling Gastrointestinal:  Denies nausea, heartburn or change in bowel habits Lymphatics: Denies new lymphadenopathy or easy  bruising Neurological:Denies numbness, tingling or new weaknesses Behavioral/Psych: Mood is stable, no new changes  All other systems were reviewed with the patient and are negative.  I have reviewed the past medical history, past surgical history, social history and family history with the patient and they are unchanged from previous note.  ALLERGIES:  has No Known Allergies.  MEDICATIONS:  Current Outpatient Medications  Medication Sig Dispense Refill   amLODipine (NORVASC) 10 MG tablet Take 1 tablet (10 mg total) by mouth daily. 90 tablet 0   aspirin 81 MG EC tablet Take 1 tablet (81 mg total) by mouth daily. 30 tablet 12   atorvastatin (LIPITOR) 80 MG tablet TAKE 1 TABLET (80 MG TOTAL) BY MOUTH AT BEDTIME. 30 tablet 6   Dulaglutide (TRULICITY) 1.5 ZO/1.0RU SOPN Inject 1.5 mg into the skin once a week. 2 mL 6   glimepiride (AMARYL) 4 MG tablet Take 1 tablet (4 mg total) by mouth daily with breakfast. 30 tablet 6   isosorbide mononitrate (IMDUR) 30 MG 24 hr tablet Take 2 tablets (60 mg total) by mouth daily. 60 tablet 6   lenvatinib 10 mg daily dose (LENVIMA, 10 MG DAILY DOSE,) capsule Take 1 capsule (10 mg total) by mouth daily. 30 capsule 11   lisinopril (ZESTRIL) 10 MG tablet Take 3 tablets (30 mg total) by mouth daily. 90 tablet 0   metFORMIN (GLUCOPHAGE) 500 MG tablet Take 2 tablets (1,000 mg total) by mouth 2 (two) times daily with a meal. 120 tablet 6   metoprolol tartrate (LOPRESSOR) 100 MG tablet TAKE 1 TABLET (100 MG TOTAL) BY MOUTH 2 (TWO) TIMES DAILY. 60 tablet 6   nitroGLYCERIN (NITROSTAT) 0.4 MG SL tablet Place 1 tablet (0.4 mg total) under the tongue every 5 (  five) minutes as needed for chest pain. 25 tablet 2   No current facility-administered medications for this visit.   Facility-Administered Medications Ordered in Other Visits  Medication Dose Route Frequency Provider Last Rate Last Admin   heparin lock flush 100 unit/mL  500 Units Intracatheter Once PRN Alvy Bimler, Machael Raine,  MD       pembrolizumab (KEYTRUDA) 200 mg in sodium chloride 0.9 % 50 mL chemo infusion  200 mg Intravenous Once Alvy Bimler, Zarra Geffert, MD 116 mL/hr at 10/11/22 1137 200 mg at 10/11/22 1137   sodium chloride flush (NS) 0.9 % injection 10 mL  10 mL Intracatheter PRN Heath Lark, MD        SUMMARY OF ONCOLOGIC HISTORY: Oncology History Overview Note  Pap 12/14/20: adenocarcinoma, HPV negative  HER-2 positive by FISH Her-2 equivocal by [facility]  MSI stable    Papillary serous adenocarcinoma of endometrium ([place])   Initial Diagnosis   Endometrial carcinoma ([place])   01/12/2021 Initial Biopsy   A. ENDOCERVIX, CURETTAGE:  - Adenocarcinoma.  B. ENDOMETRIUM, BIOPSY:  - Adenocarcinoma.  COMMENT:  The differential diagnosis includes high grade serous carcinoma.  Both  specimens have a similar morphology; high grade serous carcinoma more  commonly arises in the endometrium.  Results reported to Dr. Hale Bogus  on 01/15/2021.  Dr. Saralyn Pilar reviewed the case.    01/16/2021 Tumor Marker   Patient's tumor was tested for the following markers: CA-125 Results of the tumor marker test revealed normal value, 10.7   01/25/2021 Imaging   CT C/A/P: 1. Small volume fluid in the endometrial canal with 7 mm hypoattenuating lesion in the myometrium of the anterior fundus. 2. No definite evidence for metastatic disease in the chest, abdomen, or pelvis. Small lymph nodes are noted along both pelvic sidewalls. Attention on follow-up recommended. 3. 5 mm ground-glass opacity peripheral left upper lobe. This is likely related to infection/inflammation and potentially scar. Attention on surveillance imaging recommended. 4. 5.8 cm lesion posterior lower uterine segment likely a fibroid. Ultrasound exam from 01/28/2010 demonstrated a 5.2 cm lesion in the left aspect of the posterior lower uterine body. 5. Aortic Atherosclerosis (ICD10-I70.0).   01/29/2021 Surgery   TRH/BSO, SLN biopsy left, selective pelvic LND right,  omentectomy  Findings: On EUA, 10-12cm enlarged mobile uterus. On intra-abdomina entry, minimal adhesions between the liver and anterior abdomen on the right. Some changes c/w fatty liver on the left. Omentum, stomach, small and large bowel all grossly normal. Bilateral ovaries normal appearing. Uterus with 5-6cm posterior fibroid, otherwise normal appearing. No mapping to right pelvis. On left, mapping to the level of the superior vessel artery, enlarged lymph node noted (likely sentinel) within the upper aspect of the obturator space. In bilateral pelvic basins, multiple enlarged lymph nodes. On the right, enlarged lymph nodes extended superiorly along the common iliac vessels. At the end of surgery, no obvious abdominal or pelvic evidence of disease.   01/29/2021 Pathology Results   A. SENTINEL LYMPH NODE, LEFT INTERNAL ILIAC, BIOPSY:  - Metastatic carcinoma in (1) of (1) lymph node.   B. SENTINEL LYMPH NODE, LEFT OBTURATOR, BIOPSY:  - Metastatic carcinoma in (1) of (1) lymph node.   C. LYMPH NODES, LEFT PELVIC, DISSECTION:  - Metastatic carcinoma in (1) of (3) lymph nodes.   D. LYMPH NODE, RIGHT EXTERNAL AND COMMON ILIAC, BIOPSY:  - Metastatic carcinoma in (1) of (1) lymph node.   E. UTERUS, CERVIX AND BILATERAL FALLOPIAN TUBES AND OVARIES, TOTAL  HYSTERECTOMY AND BILATERAL SALPINGO-OOPHORECTOMY:  - High grade  serous carcinoma of endometrium, with invasion more than  half of the myometrium.  - Tumor invades the stromal connective tissue of the cervix.  - No involvement of uterine serosa or adnexa.  - Lymphovascular invasion is identified.  - See oncology table.   F. OMENTUM, OMENTECTOMY:  - Omentum, negative for carcinoma.   G. LYMPH NODES, RIGHT PELVIC, DISSECTION:  - Two lymph nodes, negative for carcinoma (0/2).   ONCOLOGY TABLE:   UTERUS, CARCINOMA OR CARCINOSARCOMA: Resection   Procedure: Total hysterectomy and bilateral salpingo-oophorectomy,  Omentectomy, Lymph node  sampling  Histologic Type: Serous carcinoma  Histologic Grade: High grade  Myometrial Invasion: > 50%  Uterine Serosa Involvement: Not identified  Cervical Stroma Involvement: Present  Other Tissue/Organ Involvement: Not identified  Peritoneal/Ascitic Fluid: Negative for carcinoma  Lymphovascular Invasion: Present  Regional Lymph Nodes:       Pelvic Lymph Nodes Examined:            2 Sentinel            6 Non-Sentinel            8 Total       Pelvic Lymph Nodes with Metastasis: 4            Macrometastasis: 3            Micrometastasis: 1            Isolated Tumor Cells: 0            Laterality of Lymph Nodes with Tumor: Right (non-sentinel),  Left (sentinel and non-sentinel)            Extracapsular Extension: Present       Para-Aortic Lymph Nodes Examined: Not applicable        Para-Aortic Lymph Nodes with Metastasis: Not applicable  Distant Metastasis:       Distant Site(s) Involved: Omentum: Not involved  Pathologic Stage Classification (pTNM, AJCC 8th Edition): pT2, pN1a  Ancillary Studies: HER2 will be ordered  Additional Findings: Leiomyomata.  Adenomyosis.  Representative Tumor Block: B1  Comment: Dr. Saralyn Pilar reviewed select slides.  (v4.2.0.1)    01/29/2021 Cancer Staging   Staging form: Corpus Uteri - Carcinoma and Carcinosarcoma, AJCC 8th Edition - Clinical stage from 01/29/2021: FIGO Stage IIIC1 (cT1b, cN1a, cM0) - Signed by Lafonda Mosses, MD on 02/02/2021 Histopathologic type: Mixed cell adenocarcinoma Stage prefix: Initial diagnosis Method of lymph node assessment: Other Histologic grade (G): G3 Histologic grading system: 3 grade system Lymph-vascular invasion (LVI): LVI present/identified, NOS Peritoneal cytology results: Negative Pelvic nodal status: Positive Number of pelvic nodes positive from dissection: 4 Number of pelvic nodes examined during dissection: 8 Para-aortic status: Not assessed Lymph node metastasis: Present Omentectomy performed:  Yes Morcellation performed: No   02/19/2021 Echocardiogram    1. Left ventricular ejection fraction, by estimation, is 55 to 60%. The left ventricle has normal function. The left ventricle demonstrates regional wall motion abnormalities (see scoring diagram/findings for description). There is mild left ventricular  hypertrophy. Left ventricular diastolic parameters are consistent with Grade I diastolic dysfunction (impaired relaxation). There is moderate hypokinesis of the left ventricular, basal septal wall and inferior wall. The average left ventricular global longitudinal strain is -17.9 %. The global longitudinal strain is abnormal.  2. Right ventricular systolic function is normal. The right ventricular size is normal. There is normal pulmonary artery systolic pressure. The estimated right ventricular systolic pressure is 03.4 mmHg.  3. The mitral valve is abnormal. Trivial mitral valve regurgitation.  4.  The aortic valve is tricuspid. Aortic valve regurgitation is not visualized.  5. The inferior vena cava is normal in size with greater than 50% respiratory variability, suggesting right atrial pressure of 3 mmHg.   02/20/2021 Procedure   Successful placement of a right IJ approach Power Port with ultrasound and fluoroscopic guidance. The catheter is ready for use.   02/28/2021 - 06/14/2021 Chemotherapy    Patient is on Treatment Plan: UTERINE CARBOPLATIN AUC 6 / PACLITAXEL Q21D       04/20/2021 Imaging   Status post hysterectomy and suspected bilateral salpingo-oophorectomy.   2.2 cm fluid density lesion along the left pelvic sidewall likely reflects a postoperative seroma.   No findings suspicious for recurrent or metastatic disease.   07/16/2021 - 08/16/2021 Radiation Therapy   Radiation Treatment Dates: 07/16/2021 through 08/16/2021 Site Technique Total Dose (Gy) Dose per Fx (Gy) Completed Fx Beam Energies  Vagina: Pelvis HDR-brachy 30/30 6 5/5 Ir-192        10/01/2021 Imaging   Increasing  size of LEFT pelvic lymph nodes, largest approximately 11 mm along the external iliac chain. Given findings and history could consider PET for further evaluation as warranted.   Cystic area along the LEFT pelvic sidewall that was seen previously has improved.   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   Aortic Atherosclerosis (ICD10-I70.0).   12/21/2021 Imaging   1. Enlarging soft tissue masses involving the vaginal cuff. 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 Speculum examination and re-biopsy may be confirmatory. PET-CT may be helpful. 2. New small sub 5 mm perirectal and sigmoid mesocolon nodes. 3. Slight progression of pelvic adenopathy as detailed above. 4. No findings suspicious for abdominal metastatic disease or osseous metastatic disease. 5. Stable age advanced atherosclerotic calcifications involving the aorta and branch vessels and coronary arteries   12/25/2021 Echocardiogram    1. Global longitudinal strain is -16.1% Mild basal inferior hypoinesis. Compared to echo from 02/2021, no change in overall LVEF /regional wall motion. Global longitudinal strain is mildly less negative (-17.9% to -16.1%. Left ventricular ejection fraction, by estimation, is 65 to 70%. The left ventricle has normal function. The left ventricle has no regional wall motion abnormalities. There is mild left ventricular hypertrophy. Left ventricular diastolic parameters are indeterminate.  2. Right ventricular systolic function is normal. The right ventricular size is normal. There is normal pulmonary artery systolic pressure.  3. Trivial mitral valve regurgitation.  4. The aortic valve is normal in structure. Aortic valve regurgitation is not visualized.  5. The inferior vena cava is normal in size with greater than 50% respiratory variability, suggesting right atrial pressure of 3 mmHg.      01/01/2022 - 01/22/2022 Chemotherapy   Patient is on Treatment Plan : UTERINE SEROUS CARCINOMA Carboplatin + Paclitaxel + Trastuzumab q21d x 6 Cycles / Trastuzumab q21d     03/29/2022 Pathology Results   A.   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   COMMENT:   The carcinoma has diffuse strong Pax8 immunoreactivity (Mullerian marker) and abnormal overexpression of nuclear p53 and relatively diffuse strong p16 staining.  Estrogen receptor (ER) is weakly reactive  and there is focal strong Napsin A immunoreactivity.  The immunophenotype is most characteristic of serous carcinoma.   The history of prior treatment for endometrial serous carcinoma is  noted.   04/12/2022 Imaging   1. Surgical change of prior hysterectomy with increased nodular thickening at the vaginal cuff, suspicious for recurrent disease. Consider further evaluation with PET-CT versus direct tissue sampling. 2. Unchanged prominent/mildly enlarged retroperitoneal and pelvic lymph nodes, nonspecific but in the setting of disease recurrence these would be at least somewhat suspicious for nodal involvement. Consider attention on follow-up imaging. 3. No evidence of metastatic disease within the chest. 4. Left-sided colonic diverticulosis without findings of acute diverticulitis. 5.  Aortic Atherosclerosis (ICD10-I70.0).   04/16/2022 - 08/09/2022 Chemotherapy   Patient is on Treatment Plan : UTERINE Lenvatinib (20) D1-21 + Pembrolizumab (200) D1 q21d     04/16/2022 -  Chemotherapy   Patient is on Treatment Plan : UTERINE Lenvatinib (20) D1-21 + Pembrolizumab (200) D1 q21d     07/09/2022 Imaging   Mild increase in mild left para-aortic and left iliac lymphadenopathy, consistent with metastatic lymphadenopathy.   Stable nodular thickening of vaginal cuff, consistent with vaginal recurrence.   Colonic  diverticulosis, without radiographic evidence of diverticulitis.   Probable tiny calcified gallstone. No radiographic evidence of cholecystitis.   Aortic Atherosclerosis (ICD10-I70.0).     09/12/2022 Imaging   1. Mild decrease in thickening along the vaginal cuff. 2. LEFT iliac and LEFT periaortic retroperitoneal adenopathy is stable to slightly decreased. 3. No evidence of new lymphadenopathy or progressive metastatic uterine carcinoma.        PHYSICAL EXAMINATION: ECOG PERFORMANCE STATUS: 1 - Symptomatic but completely ambulatory  Vitals:   10/11/22 1039  BP: (!) 154/96  Pulse: 65  Resp: 18  SpO2: 99%   Filed Weights   10/11/22 1039  Weight: 196 lb 9.6 oz (89.2 kg)    GENERAL:alert, no distress and comfortable NEURO: alert & oriented x 3 with fluent speech, no focal motor/sensory deficits  LABORATORY DATA:  I have reviewed the data as listed    Component Value Date/Time   NA 139 10/11/2022 1014   NA 143 10/18/2020 1028   K 3.4 (L) 10/11/2022 1014   CL 106 10/11/2022 1014   CO2 25 10/11/2022 1014   GLUCOSE 187 (H) 10/11/2022 1014   BUN 19 10/11/2022 1014   BUN 13 10/18/2020 1028   CREATININE 0.99 10/11/2022 1014   CREATININE 0.79 02/11/2017 0943   CALCIUM 9.5 10/11/2022 1014   PROT 7.4 10/11/2022 1014   PROT 6.7 10/18/2020 1028   ALBUMIN 3.6 10/11/2022 1014   ALBUMIN 4.1 10/18/2020 1028   AST 15 10/11/2022 1014   ALT 13 10/11/2022 1014   ALKPHOS 73 10/11/2022 1014   BILITOT 0.4 10/11/2022 1014   GFRNONAA >60 10/11/2022 1014   GFRNONAA 83 02/11/2017 0943   GFRAA 70 10/18/2020 1028   GFRAA >89 02/11/2017 0943    No results found for: "SPEP", "UPEP"  Lab Results  Component Value Date   WBC 4.4 10/11/2022   NEUTROABS 2.3 10/11/2022   HGB 11.7 (L) 10/11/2022   HCT 34.6 (L) 10/11/2022   MCV 88.9 10/11/2022   PLT 278 10/11/2022      Chemistry      Component Value Date/Time   NA 139 10/11/2022 1014   NA 143 10/18/2020 1028   K 3.4 (L) 10/11/2022  1014   CL 106 10/11/2022 1014   CO2 25 10/11/2022 1014   BUN 19 10/11/2022 1014   BUN 13 10/18/2020 1028   CREATININE 0.99 10/11/2022 1014   CREATININE 0.79 02/11/2017 0943      Component Value Date/Time   CALCIUM 9.5 10/11/2022  1014   ALKPHOS 73 10/11/2022 1014   AST 15 10/11/2022 1014   ALT 13 10/11/2022 1014   BILITOT 0.4 10/11/2022 1014

## 2022-10-11 NOTE — Patient Instructions (Signed)
[facility] MEDICAL ONCOLOGY   Discharge Instructions: Thank you for choosing [facility] to provide your oncology and hematology care.   If you have a lab appointment with the Cancer Center, please go directly to the Cancer Center and check in at the registration area.   Wear comfortable clothing and clothing appropriate for easy access to any Portacath or PICC line.   We strive to give you quality time with your provider. You may need to reschedule your appointment if you arrive late (15 or more minutes).  Arriving late affects you and other patients whose appointments are after yours.  Also, if you miss three or more appointments without notifying the office, you may be dismissed from the clinic at the provider's discretion.      For prescription refill requests, have your pharmacy contact our office and allow 72 hours for refills to be completed.    Today you received the following chemotherapy and/or immunotherapy agents: Pembrolizumab (Keytruda)      To help prevent nausea and vomiting after your treatment, we encourage you to take your nausea medication as directed.  BELOW ARE SYMPTOMS THAT SHOULD BE REPORTED IMMEDIATELY: *FEVER GREATER THAN 100.4 F (38 C) OR HIGHER *CHILLS OR SWEATING *NAUSEA AND VOMITING THAT IS NOT CONTROLLED WITH YOUR NAUSEA MEDICATION *UNUSUAL SHORTNESS OF BREATH *UNUSUAL BRUISING OR BLEEDING *URINARY PROBLEMS (pain or burning when urinating, or frequent urination) *BOWEL PROBLEMS (unusual diarrhea, constipation, pain near the anus) TENDERNESS IN MOUTH AND THROAT WITH OR WITHOUT PRESENCE OF ULCERS (sore throat, sores in mouth, or a toothache) UNUSUAL RASH, SWELLING OR PAIN  UNUSUAL VAGINAL DISCHARGE OR ITCHING   Items with * indicate a potential emergency and should be followed up as soon as possible or go to the Emergency Department if any problems should occur.  Please show the CHEMOTHERAPY ALERT CARD or IMMUNOTHERAPY ALERT  CARD at check-in to the Emergency Department and triage nurse.  Should you have questions after your visit or need to cancel or reschedule your appointment, please contact [facility] MEDICAL ONCOLOGY  Dept: [phone]  and follow the prompts.  Office hours are 8:00 a.m. to 4:30 p.m. Monday - Friday. Please note that voicemails left after 4:00 p.m. may not be returned until the following business day.  We are closed weekends and major holidays. You have access to a nurse at all times for urgent questions. Please call the main number to the clinic Dept: [phone] and follow the prompts.   For any non-urgent questions, you may also contact your provider using MyChart. We now offer e-Visits for anyone 18 and older to request care online for non-urgent symptoms. For details visit [email or web address].   Also download the MyChart app! Go to the app store, search "MyChart", open the app, select , and log in with your MyChart username and password.  Masks are optional in the cancer centers. If you would like for your care team to wear a mask while they are taking care of you, please let them know. You may have one support person who is at least 64 years old accompany you for your appointments. 

## 2022-10-12 LAB — T4: T4, Total: 9.2 ug/dL (ref 4.5–12.0)

## 2022-10-17 ENCOUNTER — Other Ambulatory Visit (HOSPITAL_COMMUNITY): Payer: Self-pay

## 2022-10-21 ENCOUNTER — Other Ambulatory Visit (HOSPITAL_COMMUNITY): Payer: Self-pay

## 2022-10-25 ENCOUNTER — Other Ambulatory Visit: Payer: Self-pay

## 2022-10-30 ENCOUNTER — Other Ambulatory Visit (HOSPITAL_COMMUNITY): Payer: Self-pay

## 2022-11-01 ENCOUNTER — Inpatient Hospital Stay: Payer: Medicaid Other | Attending: Gynecologic Oncology

## 2022-11-01 ENCOUNTER — Inpatient Hospital Stay: Payer: Medicaid Other

## 2022-11-01 ENCOUNTER — Inpatient Hospital Stay (HOSPITAL_BASED_OUTPATIENT_CLINIC_OR_DEPARTMENT_OTHER): Payer: Medicaid Other | Admitting: Hematology and Oncology

## 2022-11-01 ENCOUNTER — Encounter: Payer: Self-pay | Admitting: Hematology and Oncology

## 2022-11-01 VITALS — BP 149/96 | HR 75 | Temp 97.4°F | Resp 18 | Ht 62.0 in | Wt 197.7 lb

## 2022-11-01 DIAGNOSIS — C541 Malignant neoplasm of endometrium: Secondary | ICD-10-CM | POA: Diagnosis not present

## 2022-11-01 DIAGNOSIS — I10 Essential (primary) hypertension: Secondary | ICD-10-CM | POA: Diagnosis not present

## 2022-11-01 DIAGNOSIS — D61818 Other pancytopenia: Secondary | ICD-10-CM

## 2022-11-01 DIAGNOSIS — Z7985 Long-term (current) use of injectable non-insulin antidiabetic drugs: Secondary | ICD-10-CM | POA: Insufficient documentation

## 2022-11-01 DIAGNOSIS — Z79899 Other long term (current) drug therapy: Secondary | ICD-10-CM | POA: Insufficient documentation

## 2022-11-01 DIAGNOSIS — Z5112 Encounter for antineoplastic immunotherapy: Secondary | ICD-10-CM | POA: Diagnosis present

## 2022-11-01 DIAGNOSIS — Z7984 Long term (current) use of oral hypoglycemic drugs: Secondary | ICD-10-CM | POA: Insufficient documentation

## 2022-11-01 DIAGNOSIS — Z7982 Long term (current) use of aspirin: Secondary | ICD-10-CM | POA: Insufficient documentation

## 2022-11-01 LAB — CMP (CANCER CENTER ONLY)
ALT: 13 U/L (ref 0–44)
AST: 14 U/L — ABNORMAL LOW (ref 15–41)
Albumin: 3.7 g/dL (ref 3.5–5.0)
Alkaline Phosphatase: 79 U/L (ref 38–126)
Anion gap: 9 (ref 5–15)
BUN: 18 mg/dL (ref 8–23)
CO2: 26 mmol/L (ref 22–32)
Calcium: 9.5 mg/dL (ref 8.9–10.3)
Chloride: 105 mmol/L (ref 98–111)
Creatinine: 0.86 mg/dL (ref 0.44–1.00)
GFR, Estimated: >60 mL/min (ref >60)
Glucose, Bld: 140 mg/dL — ABNORMAL HIGH (ref 70–99)
Potassium: 3.5 mmol/L (ref 3.5–5.1)
Sodium: 140 mmol/L (ref 135–145)
Total Bilirubin: 0.5 mg/dL (ref 0.3–1.2)
Total Protein: 7.3 g/dL (ref 6.5–8.1)

## 2022-11-01 LAB — CBC WITH DIFFERENTIAL (CANCER CENTER ONLY)
Abs Immature Granulocytes: 0.01 10*3/uL (ref 0.00–0.07)
Basophils Absolute: 0 10*3/uL (ref 0.0–0.1)
Basophils Relative: 0 %
Eosinophils Absolute: 0.1 10*3/uL (ref 0.0–0.5)
Eosinophils Relative: 1 %
HCT: 33.8 % — ABNORMAL LOW (ref 36.0–46.0)
Hemoglobin: 11.5 g/dL — ABNORMAL LOW (ref 12.0–15.0)
Immature Granulocytes: 0 %
Lymphocytes Relative: 34 %
Lymphs Abs: 1.7 10*3/uL (ref 0.7–4.0)
MCH: 30.7 pg (ref 26.0–34.0)
MCHC: 34 g/dL (ref 30.0–36.0)
MCV: 90.4 fL (ref 80.0–100.0)
Monocytes Absolute: 0.4 10*3/uL (ref 0.1–1.0)
Monocytes Relative: 9 %
Neutro Abs: 2.7 10*3/uL (ref 1.7–7.7)
Neutrophils Relative %: 56 %
Platelet Count: 273 10*3/uL (ref 150–400)
RBC: 3.74 MIL/uL — ABNORMAL LOW (ref 3.87–5.11)
RDW: 14.8 % (ref 11.5–15.5)
WBC Count: 4.9 10*3/uL (ref 4.0–10.5)
nRBC: 0 % (ref 0.0–0.2)

## 2022-11-01 MED ORDER — HEPARIN SOD (PORK) LOCK FLUSH 100 UNIT/ML IV SOLN
500.0000 [IU] | Freq: Once | INTRAVENOUS | Status: AC | PRN
  Administered 2022-11-01: 500 [IU]

## 2022-11-01 MED ORDER — SODIUM CHLORIDE 0.9 % IV SOLN: Freq: Once | INTRAVENOUS | Status: AC

## 2022-11-01 MED ORDER — SODIUM CHLORIDE 0.9% FLUSH
10.0000 mL | INTRAVENOUS | Status: DC | PRN
  Administered 2022-11-01: 10 mL

## 2022-11-01 MED ORDER — SODIUM CHLORIDE 0.9% FLUSH
10.0000 mL | Freq: Once | INTRAVENOUS | Status: AC
  Administered 2022-11-01: 10 mL

## 2022-11-01 MED ORDER — SODIUM CHLORIDE 0.9 % IV SOLN
200.0000 mg | Freq: Once | INTRAVENOUS | Status: AC
  Administered 2022-11-01: 200 mg via INTRAVENOUS
  Filled 2022-11-01: qty 200

## 2022-11-01 NOTE — Progress Notes (Signed)
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[facility]) Her recent imaging in September show positive response to treatment Her recent vaginal bleeding is likely due to necrotic tumor We will continue treatment indefinitely I plan to repeat imaging study again after the new year  Pancytopenia, acquired ([place]) She had pancytopenia from treatment  Overall, she is not symptomatic For now, I recommend close observation  Essential hypertension Her blood pressure is slightly controlled with aggressive monitoring and dietary modification We will continue similar prescription for now  No orders of the defined types were placed in this encounter.   All questions were answered. The patient knows to call the clinic with any problems, questions or concerns. The total time spent in the appointment was 20 minutes encounter with patients including review of chart and various tests results, discussions about plan of care and coordination of care plan   Heath Lark, MD 11/01/2022 12:39 PM  INTERVAL HISTORY: Please see below for problem oriented charting. she returns for treatment follow-up on Lenvima and pembrolizumab She continues to have intermittent brownish discharge Otherwise, she tolerated treatment very well Documented blood pressure from home were within normal range  REVIEW OF SYSTEMS:   Constitutional: Denies fevers, chills or abnormal weight loss Eyes: Denies blurriness of vision Ears, nose, mouth, throat, and face: Denies mucositis or sore throat Respiratory: Denies cough, dyspnea or wheezes Cardiovascular: Denies palpitation, chest discomfort or lower extremity swelling Gastrointestinal:  Denies nausea, heartburn or change in bowel habits Skin: Denies abnormal skin rashes Lymphatics:  Denies new lymphadenopathy or easy bruising Neurological:Denies numbness, tingling or new weaknesses Behavioral/Psych: Mood is stable, no new changes  All other systems were reviewed with the patient and are negative.  I have reviewed the past medical history, past surgical history, social history and family history with the patient and they are unchanged from previous note.  ALLERGIES:  has No Known Allergies.  MEDICATIONS:  Current Outpatient Medications  Medication Sig Dispense Refill   amLODipine (NORVASC) 10 MG tablet Take 1 tablet (10 mg total) by mouth daily. 90 tablet 0   aspirin 81 MG EC tablet Take 1 tablet (81 mg total) by mouth daily. 30 tablet 12   atorvastatin (LIPITOR) 80 MG tablet TAKE 1 TABLET (80 MG TOTAL) BY MOUTH AT BEDTIME. 30 tablet 6   Dulaglutide (TRULICITY) 1.5 UQ/3.3HL SOPN Inject 1.5 mg into the skin once a week. 2 mL 6   glimepiride (AMARYL) 4 MG tablet Take 1 tablet (4 mg total) by mouth daily with breakfast. 30 tablet 6   isosorbide mononitrate (IMDUR) 30 MG 24 hr tablet Take 2 tablets (60 mg total) by mouth daily. 60 tablet 6   lenvatinib 10 mg daily dose (LENVIMA, 10 MG DAILY DOSE,) capsule Take 1 capsule (10 mg total) by mouth daily. 30 capsule 11   lisinopril (ZESTRIL) 10 MG tablet Take 3 tablets (30 mg total) by mouth daily. 90 tablet 0   metFORMIN (GLUCOPHAGE) 500 MG tablet Take 2 tablets (1,000 mg total) by mouth 2 (two) times daily with a meal. 120 tablet 6   metoprolol tartrate (LOPRESSOR) 100 MG tablet TAKE 1 TABLET (100 MG TOTAL) BY MOUTH 2 (TWO) TIMES DAILY. 60 tablet 6   nitroGLYCERIN (NITROSTAT) 0.4 MG SL tablet Place 1 tablet (0.4 mg total) under the tongue every 5 (five) minutes as  needed for chest pain. 25 tablet 2   No current facility-administered medications for this visit.   Facility-Administered Medications Ordered in Other Visits  Medication Dose Route Frequency Provider Last Rate Last Admin   sodium chloride flush (NS) 0.9 % injection  10 mL  10 mL Intracatheter PRN Alvy Bimler, Mohogany Toppins, MD   10 mL at 11/01/22 1219    SUMMARY OF ONCOLOGIC HISTORY: Oncology History Overview Note  Pap 12/14/20: adenocarcinoma, HPV negative  HER-2 positive by FISH Her-2 equivocal by [facility]  MSI stable    Papillary serous adenocarcinoma of endometrium ([place])   Initial Diagnosis   Endometrial carcinoma ([place])   01/12/2021 Initial Biopsy   A. ENDOCERVIX, CURETTAGE:  - Adenocarcinoma.  B. ENDOMETRIUM, BIOPSY:  - Adenocarcinoma.  COMMENT:  The differential diagnosis includes high grade serous carcinoma.  Both  specimens have a similar morphology; high grade serous carcinoma more  commonly arises in the endometrium.  Results reported to Dr. Hale Bogus  on 01/15/2021.  Dr. Saralyn Pilar reviewed the case.    01/16/2021 Tumor Marker   Patient's tumor was tested for the following markers: CA-125 Results of the tumor marker test revealed normal value, 10.7   01/25/2021 Imaging   CT C/A/P: 1. Small volume fluid in the endometrial canal with 7 mm hypoattenuating lesion in the myometrium of the anterior fundus. 2. No definite evidence for metastatic disease in the chest, abdomen, or pelvis. Small lymph nodes are noted along both pelvic sidewalls. Attention on follow-up recommended. 3. 5 mm ground-glass opacity peripheral left upper lobe. This is likely related to infection/inflammation and potentially scar. Attention on surveillance imaging recommended. 4. 5.8 cm lesion posterior lower uterine segment likely a fibroid. Ultrasound exam from 01/28/2010 demonstrated a 5.2 cm lesion in the left aspect of the posterior lower uterine body. 5. Aortic Atherosclerosis (ICD10-I70.0).   01/29/2021 Surgery   TRH/BSO, SLN biopsy left, selective pelvic LND right, omentectomy  Findings: On EUA, 10-12cm enlarged mobile uterus. On intra-abdomina entry, minimal adhesions between the liver and anterior abdomen on the right. Some changes c/w fatty liver on the left.  Omentum, stomach, small and large bowel all grossly normal. Bilateral ovaries normal appearing. Uterus with 5-6cm posterior fibroid, otherwise normal appearing. No mapping to right pelvis. On left, mapping to the level of the superior vessel artery, enlarged lymph node noted (likely sentinel) within the upper aspect of the obturator space. In bilateral pelvic basins, multiple enlarged lymph nodes. On the right, enlarged lymph nodes extended superiorly along the common iliac vessels. At the end of surgery, no obvious abdominal or pelvic evidence of disease.   01/29/2021 Pathology Results   A. SENTINEL LYMPH NODE, LEFT INTERNAL ILIAC, BIOPSY:  - Metastatic carcinoma in (1) of (1) lymph node.   B. SENTINEL LYMPH NODE, LEFT OBTURATOR, BIOPSY:  - Metastatic carcinoma in (1) of (1) lymph node.   C. LYMPH NODES, LEFT PELVIC, DISSECTION:  - Metastatic carcinoma in (1) of (3) lymph nodes.   D. LYMPH NODE, RIGHT EXTERNAL AND COMMON ILIAC, BIOPSY:  - Metastatic carcinoma in (1) of (1) lymph node.   E. UTERUS, CERVIX AND BILATERAL FALLOPIAN TUBES AND OVARIES, TOTAL  HYSTERECTOMY AND BILATERAL SALPINGO-OOPHORECTOMY:  - High grade serous carcinoma of endometrium, with invasion more than  half of the myometrium.  - Tumor invades the stromal connective tissue of the cervix.  - No involvement of uterine serosa or adnexa.  - Lymphovascular invasion is identified.  - See oncology table.   F. OMENTUM, OMENTECTOMY:  - Omentum, negative  for carcinoma.   G. LYMPH NODES, RIGHT PELVIC, DISSECTION:  - Two lymph nodes, negative for carcinoma (0/2).   ONCOLOGY TABLE:   UTERUS, CARCINOMA OR CARCINOSARCOMA: Resection   Procedure: Total hysterectomy and bilateral salpingo-oophorectomy,  Omentectomy, Lymph node sampling  Histologic Type: Serous carcinoma  Histologic Grade: High grade  Myometrial Invasion: > 50%  Uterine Serosa Involvement: Not identified  Cervical Stroma Involvement: Present  Other  Tissue/Organ Involvement: Not identified  Peritoneal/Ascitic Fluid: Negative for carcinoma  Lymphovascular Invasion: Present  Regional Lymph Nodes:       Pelvic Lymph Nodes Examined:            2 Sentinel            6 Non-Sentinel            8 Total       Pelvic Lymph Nodes with Metastasis: 4            Macrometastasis: 3            Micrometastasis: 1            Isolated Tumor Cells: 0            Laterality of Lymph Nodes with Tumor: Right (non-sentinel),  Left (sentinel and non-sentinel)            Extracapsular Extension: Present       Para-Aortic Lymph Nodes Examined: Not applicable        Para-Aortic Lymph Nodes with Metastasis: Not applicable  Distant Metastasis:       Distant Site(s) Involved: Omentum: Not involved  Pathologic Stage Classification (pTNM, AJCC 8th Edition): pT2, pN1a  Ancillary Studies: HER2 will be ordered  Additional Findings: Leiomyomata.  Adenomyosis.  Representative Tumor Block: B1  Comment: Dr. Saralyn Pilar reviewed select slides.  (v4.2.0.1)    01/29/2021 Cancer Staging   Staging form: Corpus Uteri - Carcinoma and Carcinosarcoma, AJCC 8th Edition - Clinical stage from 01/29/2021: FIGO Stage IIIC1 (cT1b, cN1a, cM0) - Signed by Lafonda Mosses, MD on 02/02/2021 Histopathologic type: Mixed cell adenocarcinoma Stage prefix: Initial diagnosis Method of lymph node assessment: Other Histologic grade (G): G3 Histologic grading system: 3 grade system Lymph-vascular invasion (LVI): LVI present/identified, NOS Peritoneal cytology results: Negative Pelvic nodal status: Positive Number of pelvic nodes positive from dissection: 4 Number of pelvic nodes examined during dissection: 8 Para-aortic status: Not assessed Lymph node metastasis: Present Omentectomy performed: Yes Morcellation performed: No   02/19/2021 Echocardiogram    1. Left ventricular ejection fraction, by estimation, is 55 to 60%. The left ventricle has normal function. The left ventricle  demonstrates regional wall motion abnormalities (see scoring diagram/findings for description). There is mild left ventricular  hypertrophy. Left ventricular diastolic parameters are consistent with Grade I diastolic dysfunction (impaired relaxation). There is moderate hypokinesis of the left ventricular, basal septal wall and inferior wall. The average left ventricular global longitudinal strain is -17.9 %. The global longitudinal strain is abnormal.  2. Right ventricular systolic function is normal. The right ventricular size is normal. There is normal pulmonary artery systolic pressure. The estimated right ventricular systolic pressure is 56.3 mmHg.  3. The mitral valve is abnormal. Trivial mitral valve regurgitation.  4. The aortic valve is tricuspid. Aortic valve regurgitation is not visualized.  5. The inferior vena cava is normal in size with greater than 50% respiratory variability, suggesting right atrial pressure of 3 mmHg.   02/20/2021 Procedure   Successful placement of a right IJ approach Power Port with ultrasound and fluoroscopic  guidance. The catheter is ready for use.   02/28/2021 - 06/14/2021 Chemotherapy    Patient is on Treatment Plan: UTERINE CARBOPLATIN AUC 6 / PACLITAXEL Q21D       04/20/2021 Imaging   Status post hysterectomy and suspected bilateral salpingo-oophorectomy.   2.2 cm fluid density lesion along the left pelvic sidewall likely reflects a postoperative seroma.   No findings suspicious for recurrent or metastatic disease.   07/16/2021 - 08/16/2021 Radiation Therapy   Radiation Treatment Dates: 07/16/2021 through 08/16/2021 Site Technique Total Dose (Gy) Dose per Fx (Gy) Completed Fx Beam Energies  Vagina: Pelvis HDR-brachy 30/30 6 5/5 Ir-192        10/01/2021 Imaging   Increasing size of LEFT pelvic lymph nodes, largest approximately 11 mm along the external iliac chain. Given findings and history could consider PET for further evaluation as warranted.   Cystic  area along the LEFT pelvic sidewall that was seen previously has improved.   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   Aortic Atherosclerosis (ICD10-I70.0).   12/21/2021 Imaging   1. Enlarging soft tissue masses involving the vaginal cuff. 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 Speculum examination and re-biopsy may be confirmatory. PET-CT may be helpful. 2. New small sub 5 mm perirectal and sigmoid mesocolon nodes. 3. Slight progression of pelvic adenopathy as detailed above. 4. No findings suspicious for abdominal metastatic disease or osseous metastatic disease. 5. Stable age advanced atherosclerotic calcifications involving the aorta and branch vessels and coronary arteries   12/25/2021 Echocardiogram    1. Global longitudinal strain is -16.1% Mild basal inferior hypoinesis. Compared to echo from 02/2021, no change in overall LVEF /regional wall motion. Global longitudinal strain is mildly less negative (-17.9% to -16.1%. Left ventricular ejection fraction, by estimation, is 65 to 70%. The left ventricle has normal function. The left ventricle has no regional wall motion abnormalities. There is mild left ventricular hypertrophy. Left ventricular diastolic parameters are indeterminate.  2. Right ventricular systolic function is normal. The right ventricular size is normal. There is normal pulmonary artery systolic pressure.  3. Trivial mitral valve regurgitation.  4. The aortic valve is normal in structure. Aortic valve regurgitation is not visualized.  5. The inferior vena cava is normal in size with greater than 50% respiratory variability, suggesting right atrial pressure of 3 mmHg.     01/01/2022 - 01/22/2022 Chemotherapy   Patient is on Treatment Plan : UTERINE SEROUS CARCINOMA Carboplatin + Paclitaxel + Trastuzumab q21d x 6 Cycles / Trastuzumab q21d     03/29/2022  Pathology Results   A.   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   COMMENT:   The carcinoma has diffuse strong Pax8 immunoreactivity (Mullerian marker) and abnormal overexpression of nuclear p53 and relatively diffuse strong p16 staining.  Estrogen receptor (ER) is weakly reactive  and there is focal strong Napsin A immunoreactivity.  The immunophenotype is most characteristic of serous carcinoma.   The history of prior treatment for endometrial serous carcinoma is noted.   04/12/2022 Imaging   1. Surgical change of prior hysterectomy with increased nodular thickening at the vaginal cuff, suspicious for recurrent disease. Consider further evaluation with PET-CT versus direct tissue sampling. 2. Unchanged prominent/mildly enlarged retroperitoneal and pelvic lymph nodes, nonspecific but in the setting of disease recurrence these would  be at least somewhat suspicious for nodal involvement. Consider attention on follow-up imaging. 3. No evidence of metastatic disease within the chest. 4. Left-sided colonic diverticulosis without findings of acute diverticulitis. 5.  Aortic Atherosclerosis (ICD10-I70.0).   04/16/2022 - 08/09/2022 Chemotherapy   Patient is on Treatment Plan : UTERINE Lenvatinib (20) D1-21 + Pembrolizumab (200) D1 q21d     04/16/2022 -  Chemotherapy   Patient is on Treatment Plan : UTERINE Lenvatinib (20) D1-21 + Pembrolizumab (200) D1 q21d     07/09/2022 Imaging   Mild increase in mild left para-aortic and left iliac lymphadenopathy, consistent with metastatic lymphadenopathy.   Stable nodular thickening of vaginal cuff, consistent with vaginal recurrence.   Colonic diverticulosis, without radiographic evidence of diverticulitis.   Probable tiny calcified gallstone. No radiographic evidence of cholecystitis.   Aortic Atherosclerosis  (ICD10-I70.0).     09/12/2022 Imaging   1. Mild decrease in thickening along the vaginal cuff. 2. LEFT iliac and LEFT periaortic retroperitoneal adenopathy is stable to slightly decreased. 3. No evidence of new lymphadenopathy or progressive metastatic uterine carcinoma.        PHYSICAL EXAMINATION: ECOG PERFORMANCE STATUS: 1 - Symptomatic but completely ambulatory  Vitals:   11/01/22 1043  BP: (!) 149/96  Pulse: 75  Resp: 18  Temp: (!) 97.4 F (36.3 C)  SpO2: 100%   Filed Weights   11/01/22 1043  Weight: 197 lb 11.2 oz (89.7 kg)    GENERAL:alert, no distress and comfortable NEURO: alert & oriented x 3 with fluent speech, no focal motor/sensory deficits  LABORATORY DATA:  I have reviewed the data as listed    Component Value Date/Time   NA 140 11/01/2022 1015   NA 143 10/18/2020 1028   K 3.5 11/01/2022 1015   CL 105 11/01/2022 1015   CO2 26 11/01/2022 1015   GLUCOSE 140 (H) 11/01/2022 1015   BUN 18 11/01/2022 1015   BUN 13 10/18/2020 1028   CREATININE 0.86 11/01/2022 1015   CREATININE 0.79 02/11/2017 0943   CALCIUM 9.5 11/01/2022 1015   PROT 7.3 11/01/2022 1015   PROT 6.7 10/18/2020 1028   ALBUMIN 3.7 11/01/2022 1015   ALBUMIN 4.1 10/18/2020 1028   AST 14 (L) 11/01/2022 1015   ALT 13 11/01/2022 1015   ALKPHOS 79 11/01/2022 1015   BILITOT 0.5 11/01/2022 1015   GFRNONAA >60 11/01/2022 1015   GFRNONAA 83 02/11/2017 0943   GFRAA 70 10/18/2020 1028   GFRAA >89 02/11/2017 0943    No results found for: "SPEP", "UPEP"  Lab Results  Component Value Date   WBC 4.9 11/01/2022   NEUTROABS 2.7 11/01/2022   HGB 11.5 (L) 11/01/2022   HCT 33.8 (L) 11/01/2022   MCV 90.4 11/01/2022   PLT 273 11/01/2022      Chemistry      Component Value Date/Time   NA 140 11/01/2022 1015   NA 143 10/18/2020 1028   K 3.5 11/01/2022 1015   CL 105 11/01/2022 1015   CO2 26 11/01/2022 1015   BUN 18 11/01/2022 1015   BUN 13 10/18/2020 1028   CREATININE 0.86 11/01/2022 1015    CREATININE 0.79 02/11/2017 0943      Component Value Date/Time   CALCIUM 9.5 11/01/2022 1015   ALKPHOS 79 11/01/2022 1015   AST 14 (L) 11/01/2022 1015   ALT 13 11/01/2022 1015   BILITOT 0.5 11/01/2022 1015

## 2022-11-01 NOTE — Assessment & Plan Note (Signed)
She had pancytopenia from treatment  Overall, she is not symptomatic For now, I recommend close observation

## 2022-11-01 NOTE — Assessment & Plan Note (Addendum)
Her recent imaging in September show positive response to treatment Her recent vaginal bleeding is likely due to necrotic tumor We will continue treatment indefinitely I plan to repeat imaging study again after the new year

## 2022-11-01 NOTE — Assessment & Plan Note (Signed)
Her blood pressure is slightly controlled with aggressive monitoring and dietary modification We will continue similar prescription for now

## 2022-11-02 ENCOUNTER — Other Ambulatory Visit: Payer: Self-pay

## 2022-11-08 ENCOUNTER — Other Ambulatory Visit (HOSPITAL_COMMUNITY): Payer: Self-pay

## 2022-11-08 ENCOUNTER — Other Ambulatory Visit: Payer: Self-pay | Admitting: Family Medicine

## 2022-11-09 ENCOUNTER — Other Ambulatory Visit: Payer: Self-pay

## 2022-11-11 ENCOUNTER — Other Ambulatory Visit (HOSPITAL_BASED_OUTPATIENT_CLINIC_OR_DEPARTMENT_OTHER): Payer: Self-pay

## 2022-11-11 ENCOUNTER — Other Ambulatory Visit (HOSPITAL_COMMUNITY): Payer: Self-pay

## 2022-11-11 MED ORDER — AMLODIPINE BESYLATE 10 MG PO TABS
10.0000 mg | ORAL_TABLET | Freq: Every day | ORAL | 0 refills | Status: DC
  Filled 2022-11-11: qty 90, 90d supply, fill #0

## 2022-11-11 NOTE — Telephone Encounter (Signed)
Patient needs OV, will refill medication until an OV can be made. Patient needs refills for additional refills.  Requested Prescriptions  Pending Prescriptions Disp Refills   amLODipine (NORVASC) 10 MG tablet 30 tablet 0    Sig: Take 1 tablet (10 mg total) by mouth daily.     Cardiovascular: Calcium Channel Blockers 2 Failed - 11/08/2022 11:24 AM      Failed - Last BP in normal range    BP Readings from Last 1 Encounters:  11/01/22 (!) 149/96         Failed - Valid encounter within last 6 months    Recent Outpatient Visits           7 months ago Type 2 diabetes mellitus with other circulatory complication, without long-term current use of insulin ([place])   [place], [place], MD   1 year ago Type 2 diabetes mellitus with other circulatory complication, without long-term current use of insulin ([place])   [facility] [place], [name], MD   1 year ago Post-menopausal bleeding   [place], [name], MD   2 years ago Type 2 diabetes mellitus with other circulatory complication, without long-term current use of insulin ([place])   [place], [name], MD   2 years ago Type 2 diabetes mellitus with other circulatory complication, without long-term current use of insulin [facility])   [place], [place], MD              Passed - Last Heart Rate in normal range    Pulse Readings from Last 1 Encounters:  11/01/22 75

## 2022-11-18 ENCOUNTER — Other Ambulatory Visit (HOSPITAL_COMMUNITY): Payer: Self-pay

## 2022-11-20 ENCOUNTER — Other Ambulatory Visit (HOSPITAL_COMMUNITY): Payer: Self-pay

## 2022-11-22 ENCOUNTER — Other Ambulatory Visit: Payer: Self-pay | Admitting: Hematology and Oncology

## 2022-11-22 ENCOUNTER — Inpatient Hospital Stay: Payer: Medicaid Other

## 2022-11-22 ENCOUNTER — Encounter: Payer: Self-pay | Admitting: Hematology and Oncology

## 2022-11-22 ENCOUNTER — Inpatient Hospital Stay: Payer: Medicaid Other | Attending: Gynecologic Oncology | Admitting: Hematology and Oncology

## 2022-11-22 VITALS — BP 154/92 | HR 75 | Temp 98.9°F | Resp 16 | Ht 62.0 in | Wt 197.8 lb

## 2022-11-22 DIAGNOSIS — C541 Malignant neoplasm of endometrium: Secondary | ICD-10-CM

## 2022-11-22 DIAGNOSIS — Z5112 Encounter for antineoplastic immunotherapy: Secondary | ICD-10-CM | POA: Diagnosis present

## 2022-11-22 DIAGNOSIS — Z79899 Other long term (current) drug therapy: Secondary | ICD-10-CM | POA: Diagnosis not present

## 2022-11-22 DIAGNOSIS — I10 Essential (primary) hypertension: Secondary | ICD-10-CM | POA: Diagnosis not present

## 2022-11-22 DIAGNOSIS — D6481 Anemia due to antineoplastic chemotherapy: Secondary | ICD-10-CM | POA: Diagnosis not present

## 2022-11-22 DIAGNOSIS — T451X5A Adverse effect of antineoplastic and immunosuppressive drugs, initial encounter: Secondary | ICD-10-CM | POA: Diagnosis not present

## 2022-11-22 DIAGNOSIS — Z7985 Long-term (current) use of injectable non-insulin antidiabetic drugs: Secondary | ICD-10-CM | POA: Diagnosis not present

## 2022-11-22 DIAGNOSIS — Z7984 Long term (current) use of oral hypoglycemic drugs: Secondary | ICD-10-CM | POA: Diagnosis not present

## 2022-11-22 DIAGNOSIS — Z7982 Long term (current) use of aspirin: Secondary | ICD-10-CM | POA: Insufficient documentation

## 2022-11-22 LAB — CBC WITH DIFFERENTIAL (CANCER CENTER ONLY)
Abs Immature Granulocytes: 0.01 10*3/uL (ref 0.00–0.07)
Basophils Absolute: 0 10*3/uL (ref 0.0–0.1)
Basophils Relative: 1 %
Eosinophils Absolute: 0.1 10*3/uL (ref 0.0–0.5)
Eosinophils Relative: 1 %
HCT: 36.1 % (ref 36.0–46.0)
Hemoglobin: 12.1 g/dL (ref 12.0–15.0)
Immature Granulocytes: 0 %
Lymphocytes Relative: 35 %
Lymphs Abs: 1.9 10*3/uL (ref 0.7–4.0)
MCH: 30.6 pg (ref 26.0–34.0)
MCHC: 33.5 g/dL (ref 30.0–36.0)
MCV: 91.2 fL (ref 80.0–100.0)
Monocytes Absolute: 0.5 10*3/uL (ref 0.1–1.0)
Monocytes Relative: 9 %
Neutro Abs: 2.9 10*3/uL (ref 1.7–7.7)
Neutrophils Relative %: 54 %
Platelet Count: 269 10*3/uL (ref 150–400)
RBC: 3.96 MIL/uL (ref 3.87–5.11)
RDW: 14.6 % (ref 11.5–15.5)
WBC Count: 5.4 10*3/uL (ref 4.0–10.5)
nRBC: 0 % (ref 0.0–0.2)

## 2022-11-22 LAB — CMP (CANCER CENTER ONLY)
ALT: 11 U/L (ref 0–44)
AST: 14 U/L — ABNORMAL LOW (ref 15–41)
Albumin: 3.5 g/dL (ref 3.5–5.0)
Alkaline Phosphatase: 82 U/L (ref 38–126)
Anion gap: 7 (ref 5–15)
BUN: 15 mg/dL (ref 8–23)
CO2: 28 mmol/L (ref 22–32)
Calcium: 9.7 mg/dL (ref 8.9–10.3)
Chloride: 105 mmol/L (ref 98–111)
Creatinine: 0.86 mg/dL (ref 0.44–1.00)
GFR, Estimated: >60 mL/min (ref >60)
Glucose, Bld: 112 mg/dL — ABNORMAL HIGH (ref 70–99)
Potassium: 3.6 mmol/L (ref 3.5–5.1)
Sodium: 140 mmol/L (ref 135–145)
Total Bilirubin: 0.4 mg/dL (ref 0.3–1.2)
Total Protein: 7.1 g/dL (ref 6.5–8.1)

## 2022-11-22 MED ORDER — SODIUM CHLORIDE 0.9% FLUSH
10.0000 mL | INTRAVENOUS | Status: DC | PRN
  Administered 2022-11-22: 10 mL

## 2022-11-22 MED ORDER — SODIUM CHLORIDE 0.9% FLUSH
10.0000 mL | Freq: Once | INTRAVENOUS | Status: AC
  Administered 2022-11-22: 10 mL

## 2022-11-22 MED ORDER — SODIUM CHLORIDE 0.9 % IV SOLN: Freq: Once | INTRAVENOUS | Status: AC

## 2022-11-22 MED ORDER — SODIUM CHLORIDE 0.9 % IV SOLN
200.0000 mg | Freq: Once | INTRAVENOUS | Status: AC
  Administered 2022-11-22: 200 mg via INTRAVENOUS
  Filled 2022-11-22: qty 200

## 2022-11-22 MED ORDER — HEPARIN SOD (PORK) LOCK FLUSH 100 UNIT/ML IV SOLN
500.0000 [IU] | Freq: Once | INTRAVENOUS | Status: AC | PRN
  Administered 2022-11-22: 500 [IU]

## 2022-11-22 NOTE — Progress Notes (Signed)
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[facility]) Her recent imaging in September show positive response to treatment Her recent vaginal bleeding is likely due to necrotic tumor We will continue treatment indefinitely I plan to repeat imaging study again after the new year  Anemia due to antineoplastic chemotherapy She has multifactorial anemia, component of anemia chronic illness and from prior treatment Observe closely  Essential hypertension Her blood pressure is intermittently elevated but her home blood pressure monitoring was within normal range She will continue current medications  No orders of the defined types were placed in this encounter.   All questions were answered. The patient knows to call the clinic with any problems, questions or concerns. The total time spent in the appointment was 25 minutes encounter with patients including review of chart and various tests results, discussions about plan of care and coordination of care plan   Heath Lark, MD 11/22/2022 9:40 AM  INTERVAL HISTORY: Please see below for problem oriented charting. she returns for treatment follow-up on Lenvima and pembrolizumab She has mild intermittent vaginal bleeding but not significant Otherwise, she have no side effects from treatment  REVIEW OF SYSTEMS:   Constitutional: Denies fevers, chills or abnormal weight loss Eyes: Denies blurriness of vision Ears, nose, mouth, throat, and face: Denies mucositis or sore throat Respiratory: Denies cough, dyspnea or wheezes Cardiovascular: Denies palpitation, chest discomfort or lower extremity swelling Gastrointestinal:  Denies nausea, heartburn or change in bowel habits Skin: Denies abnormal skin rashes Lymphatics: Denies new lymphadenopathy or  easy bruising Neurological:Denies numbness, tingling or new weaknesses Behavioral/Psych: Mood is stable, no new changes  All other systems were reviewed with the patient and are negative.  I have reviewed the past medical history, past surgical history, social history and family history with the patient and they are unchanged from previous note.  ALLERGIES:  has No Known Allergies.  MEDICATIONS:  Current Outpatient Medications  Medication Sig Dispense Refill   amLODipine (NORVASC) 10 MG tablet Take 1 tablet (10 mg total) by mouth daily. 90 tablet 0   aspirin 81 MG EC tablet Take 1 tablet (81 mg total) by mouth daily. 30 tablet 12   atorvastatin (LIPITOR) 80 MG tablet TAKE 1 TABLET (80 MG TOTAL) BY MOUTH AT BEDTIME. 30 tablet 6   Dulaglutide (TRULICITY) 1.5 VF/4.7BU SOPN Inject 1.5 mg into the skin once a week. 2 mL 6   glimepiride (AMARYL) 4 MG tablet Take 1 tablet (4 mg total) by mouth daily with breakfast. 30 tablet 6   isosorbide mononitrate (IMDUR) 30 MG 24 hr tablet Take 2 tablets (60 mg total) by mouth daily. 60 tablet 6   lenvatinib 10 mg daily dose (LENVIMA, 10 MG DAILY DOSE,) capsule Take 1 capsule (10 mg total) by mouth daily. 30 capsule 11   lisinopril (ZESTRIL) 10 MG tablet Take 3 tablets (30 mg total) by mouth daily. 90 tablet 0   metFORMIN (GLUCOPHAGE) 500 MG tablet Take 2 tablets (1,000 mg total) by mouth 2 (two) times daily with a meal. 120 tablet 6   metoprolol tartrate (LOPRESSOR) 100 MG tablet TAKE 1 TABLET (100 MG TOTAL) BY MOUTH 2 (TWO) TIMES DAILY. 60 tablet 6   nitroGLYCERIN (NITROSTAT) 0.4 MG SL tablet Place 1 tablet (0.4 mg total) under the tongue every 5 (five) minutes as needed for chest  pain. 25 tablet 2   No current facility-administered medications for this visit.   Facility-Administered Medications Ordered in Other Visits  Medication Dose Route Frequency Provider Last Rate Last Admin   heparin lock flush 100 unit/mL  500 Units Intracatheter Once PRN Alvy Bimler,  Muskan Bolla, MD       pembrolizumab (KEYTRUDA) 200 mg in sodium chloride 0.9 % 50 mL chemo infusion  200 mg Intravenous Once Alvy Bimler, Makynzi Eastland, MD       sodium chloride flush (NS) 0.9 % injection 10 mL  10 mL Intracatheter PRN Heath Lark, MD        SUMMARY OF ONCOLOGIC HISTORY: Oncology History Overview Note  Pap 12/14/20: adenocarcinoma, HPV negative  HER-2 positive by FISH Her-2 equivocal by [facility]  [facility] stable    Papillary serous adenocarcinoma of endometrium ([place])   Initial Diagnosis   Endometrial carcinoma ([place])   01/12/2021 Initial Biopsy   A. ENDOCERVIX, CURETTAGE:  - Adenocarcinoma.  B. ENDOMETRIUM, BIOPSY:  - Adenocarcinoma.  COMMENT:  The differential diagnosis includes high grade serous carcinoma.  Both  specimens have a similar morphology; high grade serous carcinoma more  commonly arises in the endometrium.  Results reported to Dr. Hale Bogus  on 01/15/2021.  Dr. Saralyn Pilar reviewed the case.    01/16/2021 Tumor Marker   Patient's tumor was tested for the following markers: CA-125 Results of the tumor marker test revealed normal value, 10.7   01/25/2021 Imaging   CT C/A/P: 1. Small volume fluid in the endometrial canal with 7 mm hypoattenuating lesion in the myometrium of the anterior fundus. 2. No definite evidence for metastatic disease in the chest, abdomen, or pelvis. Small lymph nodes are noted along both pelvic sidewalls. Attention on follow-up recommended. 3. 5 mm ground-glass opacity peripheral left upper lobe. This is likely related to infection/inflammation and potentially scar. Attention on surveillance imaging recommended. 4. 5.8 cm lesion posterior lower uterine segment likely a fibroid. Ultrasound exam from 01/28/2010 demonstrated a 5.2 cm lesion in the left aspect of the posterior lower uterine body. 5. Aortic Atherosclerosis (ICD10-I70.0).   01/29/2021 Surgery   TRH/BSO, SLN biopsy left, selective pelvic LND right, omentectomy  Findings: On EUA, 10-12cm  enlarged mobile uterus. On intra-abdomina entry, minimal adhesions between the liver and anterior abdomen on the right. Some changes c/w fatty liver on the left. Omentum, stomach, small and large bowel all grossly normal. Bilateral ovaries normal appearing. Uterus with 5-6cm posterior fibroid, otherwise normal appearing. No mapping to right pelvis. On left, mapping to the level of the superior vessel artery, enlarged lymph node noted (likely sentinel) within the upper aspect of the obturator space. In bilateral pelvic basins, multiple enlarged lymph nodes. On the right, enlarged lymph nodes extended superiorly along the common iliac vessels. At the end of surgery, no obvious abdominal or pelvic evidence of disease.   01/29/2021 Pathology Results   A. SENTINEL LYMPH NODE, LEFT INTERNAL ILIAC, BIOPSY:  - Metastatic carcinoma in (1) of (1) lymph node.   B. SENTINEL LYMPH NODE, LEFT OBTURATOR, BIOPSY:  - Metastatic carcinoma in (1) of (1) lymph node.   C. LYMPH NODES, LEFT PELVIC, DISSECTION:  - Metastatic carcinoma in (1) of (3) lymph nodes.   D. LYMPH NODE, RIGHT EXTERNAL AND COMMON ILIAC, BIOPSY:  - Metastatic carcinoma in (1) of (1) lymph node.   E. UTERUS, CERVIX AND BILATERAL FALLOPIAN TUBES AND OVARIES, TOTAL  HYSTERECTOMY AND BILATERAL SALPINGO-OOPHORECTOMY:  - High grade serous carcinoma of endometrium, with invasion more than  half of the  myometrium.  - Tumor invades the stromal connective tissue of the cervix.  - No involvement of uterine serosa or adnexa.  - Lymphovascular invasion is identified.  - See oncology table.   F. OMENTUM, OMENTECTOMY:  - Omentum, negative for carcinoma.   G. LYMPH NODES, RIGHT PELVIC, DISSECTION:  - Two lymph nodes, negative for carcinoma (0/2).   ONCOLOGY TABLE:   UTERUS, CARCINOMA OR CARCINOSARCOMA: Resection   Procedure: Total hysterectomy and bilateral salpingo-oophorectomy,  Omentectomy, Lymph node sampling  Histologic Type: Serous  carcinoma  Histologic Grade: High grade  Myometrial Invasion: > 50%  Uterine Serosa Involvement: Not identified  Cervical Stroma Involvement: Present  Other Tissue/Organ Involvement: Not identified  Peritoneal/Ascitic Fluid: Negative for carcinoma  Lymphovascular Invasion: Present  Regional Lymph Nodes:       Pelvic Lymph Nodes Examined:            2 Sentinel            6 Non-Sentinel            8 Total       Pelvic Lymph Nodes with Metastasis: 4            Macrometastasis: 3            Micrometastasis: 1            Isolated Tumor Cells: 0            Laterality of Lymph Nodes with Tumor: Right (non-sentinel),  Left (sentinel and non-sentinel)            Extracapsular Extension: Present       Para-Aortic Lymph Nodes Examined: Not applicable        Para-Aortic Lymph Nodes with Metastasis: Not applicable  Distant Metastasis:       Distant Site(s) Involved: Omentum: Not involved  Pathologic Stage Classification (pTNM, AJCC 8th Edition): pT2, pN1a  Ancillary Studies: HER2 will be ordered  Additional Findings: Leiomyomata.  Adenomyosis.  Representative Tumor Block: B1  Comment: Dr. Saralyn Pilar reviewed select slides.  (v4.2.0.1)    01/29/2021 Cancer Staging   Staging form: Corpus Uteri - Carcinoma and Carcinosarcoma, AJCC 8th Edition - Clinical stage from 01/29/2021: FIGO Stage IIIC1 (cT1b, cN1a, cM0) - Signed by Lafonda Mosses, MD on 02/02/2021 Histopathologic type: Mixed cell adenocarcinoma Stage prefix: Initial diagnosis Method of lymph node assessment: Other Histologic grade (G): G3 Histologic grading system: 3 grade system Lymph-vascular invasion (LVI): LVI present/identified, NOS Peritoneal cytology results: Negative Pelvic nodal status: Positive Number of pelvic nodes positive from dissection: 4 Number of pelvic nodes examined during dissection: 8 Para-aortic status: Not assessed Lymph node metastasis: Present Omentectomy performed: Yes Morcellation performed: No    02/19/2021 Echocardiogram    1. Left ventricular ejection fraction, by estimation, is 55 to 60%. The left ventricle has normal function. The left ventricle demonstrates regional wall motion abnormalities (see scoring diagram/findings for description). There is mild left ventricular  hypertrophy. Left ventricular diastolic parameters are consistent with Grade I diastolic dysfunction (impaired relaxation). There is moderate hypokinesis of the left ventricular, basal septal wall and inferior wall. The average left ventricular global longitudinal strain is -17.9 %. The global longitudinal strain is abnormal.  2. Right ventricular systolic function is normal. The right ventricular size is normal. There is normal pulmonary artery systolic pressure. The estimated right ventricular systolic pressure is 70.2 mmHg.  3. The mitral valve is abnormal. Trivial mitral valve regurgitation.  4. The aortic valve is tricuspid. Aortic valve regurgitation is not visualized.  5. The inferior vena cava is normal in size with greater than 50% respiratory variability, suggesting right atrial pressure of 3 mmHg.   02/20/2021 Procedure   Successful placement of a right IJ approach Power Port with ultrasound and fluoroscopic guidance. The catheter is ready for use.   02/28/2021 - 06/14/2021 Chemotherapy    Patient is on Treatment Plan: UTERINE CARBOPLATIN AUC 6 / PACLITAXEL Q21D       04/20/2021 Imaging   Status post hysterectomy and suspected bilateral salpingo-oophorectomy.   2.2 cm fluid density lesion along the left pelvic sidewall likely reflects a postoperative seroma.   No findings suspicious for recurrent or metastatic disease.   07/16/2021 - 08/16/2021 Radiation Therapy   Radiation Treatment Dates: 07/16/2021 through 08/16/2021 Site Technique Total Dose (Gy) Dose per Fx (Gy) Completed Fx Beam Energies  Vagina: Pelvis HDR-brachy 30/30 6 5/5 Ir-192        10/01/2021 Imaging   Increasing size of LEFT pelvic lymph nodes,  largest approximately 11 mm along the external iliac chain. Given findings and history could consider PET for further evaluation as warranted.   Cystic area along the LEFT pelvic sidewall that was seen previously has improved.   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   Aortic Atherosclerosis (ICD10-I70.0).   12/21/2021 Imaging   1. Enlarging soft tissue masses involving the vaginal cuff. 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 Speculum examination and re-biopsy may be confirmatory. PET-CT may be helpful. 2. New small sub 5 mm perirectal and sigmoid mesocolon nodes. 3. Slight progression of pelvic adenopathy as detailed above. 4. No findings suspicious for abdominal metastatic disease or osseous metastatic disease. 5. Stable age advanced atherosclerotic calcifications involving the aorta and branch vessels and coronary arteries   12/25/2021 Echocardiogram    1. Global longitudinal strain is -16.1% Mild basal inferior hypoinesis. Compared to echo from 02/2021, no change in overall LVEF /regional wall motion. Global longitudinal strain is mildly less negative (-17.9% to -16.1%. Left ventricular ejection fraction, by estimation, is 65 to 70%. The left ventricle has normal function. The left ventricle has no regional wall motion abnormalities. There is mild left ventricular hypertrophy. Left ventricular diastolic parameters are indeterminate.  2. Right ventricular systolic function is normal. The right ventricular size is normal. There is normal pulmonary artery systolic pressure.  3. Trivial mitral valve regurgitation.  4. The aortic valve is normal in structure. Aortic valve regurgitation is not visualized.  5. The inferior vena cava is normal in size with greater than 50% respiratory variability, suggesting right atrial pressure of 3 mmHg.     01/01/2022 - 01/22/2022  Chemotherapy   Patient is on Treatment Plan : UTERINE SEROUS CARCINOMA Carboplatin + Paclitaxel + Trastuzumab q21d x 6 Cycles / Trastuzumab q21d     03/29/2022 Pathology Results   A.   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   COMMENT:   The carcinoma has diffuse strong Pax8 immunoreactivity (Mullerian marker) and abnormal overexpression of nuclear p53 and relatively diffuse strong p16 staining.  Estrogen receptor (ER) is weakly reactive  and there is focal strong Napsin A immunoreactivity.  The immunophenotype is most characteristic of serous carcinoma.   The history of prior treatment for endometrial serous carcinoma is noted.   04/12/2022 Imaging   1. Surgical change of prior  hysterectomy with increased nodular thickening at the vaginal cuff, suspicious for recurrent disease. Consider further evaluation with PET-CT versus direct tissue sampling. 2. Unchanged prominent/mildly enlarged retroperitoneal and pelvic lymph nodes, nonspecific but in the setting of disease recurrence these would be at least somewhat suspicious for nodal involvement. Consider attention on follow-up imaging. 3. No evidence of metastatic disease within the chest. 4. Left-sided colonic diverticulosis without findings of acute diverticulitis. 5.  Aortic Atherosclerosis (ICD10-I70.0).   04/16/2022 - 08/09/2022 Chemotherapy   Patient is on Treatment Plan : UTERINE Lenvatinib (20) D1-21 + Pembrolizumab (200) D1 q21d     04/16/2022 -  Chemotherapy   Patient is on Treatment Plan : UTERINE Lenvatinib (20) D1-21 + Pembrolizumab (200) D1 q21d     07/09/2022 Imaging   Mild increase in mild left para-aortic and left iliac lymphadenopathy, consistent with metastatic lymphadenopathy.   Stable nodular thickening of vaginal cuff, consistent with vaginal recurrence.   Colonic diverticulosis, without  radiographic evidence of diverticulitis.   Probable tiny calcified gallstone. No radiographic evidence of cholecystitis.   Aortic Atherosclerosis (ICD10-I70.0).     09/12/2022 Imaging   1. Mild decrease in thickening along the vaginal cuff. 2. LEFT iliac and LEFT periaortic retroperitoneal adenopathy is stable to slightly decreased. 3. No evidence of new lymphadenopathy or progressive metastatic uterine carcinoma.        PHYSICAL EXAMINATION: ECOG PERFORMANCE STATUS: 1 - Symptomatic but completely ambulatory  Vitals:   11/22/22 0909  BP: (!) 154/92  Pulse: 75  Resp: 16  Temp: 98.9 F (37.2 C)  SpO2: 100%   Filed Weights   11/22/22 0909  Weight: 197 lb 12.8 oz (89.7 kg)    GENERAL:alert, no distress and comfortable  NEURO: alert & oriented x 3 with fluent speech, no focal motor/sensory deficits  LABORATORY DATA:  I have reviewed the data as listed    Component Value Date/Time   NA 140 11/22/2022 0856   NA 143 10/18/2020 1028   K 3.6 11/22/2022 0856   CL 105 11/22/2022 0856   CO2 28 11/22/2022 0856   GLUCOSE 112 (H) 11/22/2022 0856   BUN 15 11/22/2022 0856   BUN 13 10/18/2020 1028   CREATININE 0.86 11/22/2022 0856   CREATININE 0.79 02/11/2017 0943   CALCIUM 9.7 11/22/2022 0856   PROT 7.1 11/22/2022 0856   PROT 6.7 10/18/2020 1028   ALBUMIN 3.5 11/22/2022 0856   ALBUMIN 4.1 10/18/2020 1028   AST 14 (L) 11/22/2022 0856   ALT 11 11/22/2022 0856   ALKPHOS 82 11/22/2022 0856   BILITOT 0.4 11/22/2022 0856   GFRNONAA >60 11/22/2022 0856   GFRNONAA 83 02/11/2017 0943   GFRAA 70 10/18/2020 1028   GFRAA >89 02/11/2017 0943    No results found for: "SPEP", "UPEP"  Lab Results  Component Value Date   WBC 5.4 11/22/2022   NEUTROABS 2.9 11/22/2022   HGB 12.1 11/22/2022   HCT 36.1 11/22/2022   MCV 91.2 11/22/2022   PLT 269 11/22/2022      Chemistry      Component Value Date/Time   NA 140 11/22/2022 0856   NA 143 10/18/2020 1028   K 3.6 11/22/2022 0856    CL 105 11/22/2022 0856   CO2 28 11/22/2022 0856   BUN 15 11/22/2022 0856   BUN 13 10/18/2020 1028   CREATININE 0.86 11/22/2022 0856   CREATININE 0.79 02/11/2017 0943      Component Value Date/Time   CALCIUM 9.7 11/22/2022 0856   ALKPHOS 82 11/22/2022 0856  AST 14 (L) 11/22/2022 0856   ALT 11 11/22/2022 0856   BILITOT 0.4 11/22/2022 0856

## 2022-11-22 NOTE — Assessment & Plan Note (Signed)
She has multifactorial anemia, component of anemia chronic illness and from prior treatment Observe closely 

## 2022-11-22 NOTE — Assessment & Plan Note (Signed)
Her blood pressure is intermittently elevated but her home blood pressure monitoring was within normal range She will continue current medications

## 2022-11-22 NOTE — Patient Instructions (Signed)
[facility] MEDICAL ONCOLOGY   Discharge Instructions: Thank you for choosing [facility] to provide your oncology and hematology care.   If you have a lab appointment with the Cancer Center, please go directly to the Cancer Center and check in at the registration area.   Wear comfortable clothing and clothing appropriate for easy access to any Portacath or PICC line.   We strive to give you quality time with your provider. You may need to reschedule your appointment if you arrive late (15 or more minutes).  Arriving late affects you and other patients whose appointments are after yours.  Also, if you miss three or more appointments without notifying the office, you may be dismissed from the clinic at the provider's discretion.      For prescription refill requests, have your pharmacy contact our office and allow 72 hours for refills to be completed.    Today you received the following chemotherapy and/or immunotherapy agents: pembrolizumab      To help prevent nausea and vomiting after your treatment, we encourage you to take your nausea medication as directed.  BELOW ARE SYMPTOMS THAT SHOULD BE REPORTED IMMEDIATELY: *FEVER GREATER THAN 100.4 F (38 C) OR HIGHER *CHILLS OR SWEATING *NAUSEA AND VOMITING THAT IS NOT CONTROLLED WITH YOUR NAUSEA MEDICATION *UNUSUAL SHORTNESS OF BREATH *UNUSUAL BRUISING OR BLEEDING *URINARY PROBLEMS (pain or burning when urinating, or frequent urination) *BOWEL PROBLEMS (unusual diarrhea, constipation, pain near the anus) TENDERNESS IN MOUTH AND THROAT WITH OR WITHOUT PRESENCE OF ULCERS (sore throat, sores in mouth, or a toothache) UNUSUAL RASH, SWELLING OR PAIN  UNUSUAL VAGINAL DISCHARGE OR ITCHING   Items with * indicate a potential emergency and should be followed up as soon as possible or go to the Emergency Department if any problems should occur.  Please show the CHEMOTHERAPY ALERT CARD or IMMUNOTHERAPY ALERT CARD at  check-in to the Emergency Department and triage nurse.  Should you have questions after your visit or need to cancel or reschedule your appointment, please contact [facility] MEDICAL ONCOLOGY  Dept: [phone]  and follow the prompts.  Office hours are 8:00 a.m. to 4:30 p.m. Monday - Friday. Please note that voicemails left after 4:00 p.m. may not be returned until the following business day.  We are closed weekends and major holidays. You have access to a nurse at all times for urgent questions. Please call the main number to the clinic Dept: [phone] and follow the prompts.   For any non-urgent questions, you may also contact your provider using MyChart. We now offer e-Visits for anyone 18 and older to request care online for non-urgent symptoms. For details visit [email or web address].   Also download the MyChart app! Go to the app store, search "MyChart", open the app, select [facility], and log in with your MyChart username and password.  Masks are optional in the cancer centers. If you would like for your care team to wear a mask while they are taking care of you, please let them know. You may have one support person who is at least 64 years old accompany you for your appointments. 

## 2022-11-22 NOTE — Assessment & Plan Note (Signed)
Her recent imaging in September show positive response to treatment Her recent vaginal bleeding is likely due to necrotic tumor We will continue treatment indefinitely I plan to repeat imaging study again after the new year

## 2022-11-26 ENCOUNTER — Telehealth: Payer: Self-pay

## 2022-11-26 NOTE — Telephone Encounter (Signed)
-----   Message from Flo Shanks, RN sent at 11/26/2022 11:51 AM EST -----  ----- Message ----- From: Heath Lark, MD Sent: 11/26/2022  11:48 AM EST To: Flo Shanks, RN  Please remind her daughter to call for CT to be done on 1/3

## 2022-11-26 NOTE — Telephone Encounter (Signed)
Left Pt's daughter a voicemail with below message and reminders of upcoming appts. Included radiology scheduling number. Instructed daughter to call back with any questions.

## 2022-11-27 ENCOUNTER — Other Ambulatory Visit: Payer: Self-pay

## 2022-11-27 ENCOUNTER — Other Ambulatory Visit (HOSPITAL_COMMUNITY): Payer: Self-pay

## 2022-11-29 ENCOUNTER — Other Ambulatory Visit: Payer: Self-pay

## 2022-11-29 ENCOUNTER — Other Ambulatory Visit (HOSPITAL_COMMUNITY): Payer: Self-pay

## 2022-12-13 ENCOUNTER — Encounter: Payer: Self-pay | Admitting: Hematology and Oncology

## 2022-12-17 ENCOUNTER — Other Ambulatory Visit: Payer: Self-pay

## 2022-12-17 DIAGNOSIS — C541 Malignant neoplasm of endometrium: Secondary | ICD-10-CM

## 2022-12-18 ENCOUNTER — Inpatient Hospital Stay: Payer: Medicaid Other | Attending: Gynecologic Oncology

## 2022-12-18 ENCOUNTER — Ambulatory Visit (HOSPITAL_COMMUNITY)
Admission: RE | Admit: 2022-12-18 | Discharge: 2022-12-18 | Disposition: A | Discharge status: Home / Self Care | Payer: Medicaid Other | Source: Ambulatory Visit | Attending: Hematology and Oncology | Admitting: Hematology and Oncology

## 2022-12-18 DIAGNOSIS — C541 Malignant neoplasm of endometrium: Secondary | ICD-10-CM | POA: Diagnosis not present

## 2022-12-18 DIAGNOSIS — I10 Essential (primary) hypertension: Secondary | ICD-10-CM | POA: Insufficient documentation

## 2022-12-18 DIAGNOSIS — D6481 Anemia due to antineoplastic chemotherapy: Secondary | ICD-10-CM | POA: Insufficient documentation

## 2022-12-18 DIAGNOSIS — Z5112 Encounter for antineoplastic immunotherapy: Secondary | ICD-10-CM | POA: Insufficient documentation

## 2022-12-18 DIAGNOSIS — C778 Secondary and unspecified malignant neoplasm of lymph nodes of multiple regions: Secondary | ICD-10-CM | POA: Insufficient documentation

## 2022-12-18 LAB — CMP (CANCER CENTER ONLY)
ALT: 15 U/L (ref 0–44)
AST: 17 U/L (ref 15–41)
Albumin: 3.7 g/dL (ref 3.5–5.0)
Alkaline Phosphatase: 83 U/L (ref 38–126)
Anion gap: 10 (ref 5–15)
BUN: 12 mg/dL (ref 8–23)
CO2: 26 mmol/L (ref 22–32)
Calcium: 10 mg/dL (ref 8.9–10.3)
Chloride: 103 mmol/L (ref 98–111)
Creatinine: 1.04 mg/dL — ABNORMAL HIGH (ref 0.44–1.00)
GFR, Estimated: >60 mL/min (ref >60)
Glucose, Bld: 113 mg/dL — ABNORMAL HIGH (ref 70–99)
Potassium: 3.9 mmol/L (ref 3.5–5.1)
Sodium: 139 mmol/L (ref 135–145)
Total Bilirubin: 0.5 mg/dL (ref 0.3–1.2)
Total Protein: 7.2 g/dL (ref 6.5–8.1)

## 2022-12-18 MED ORDER — SODIUM CHLORIDE (PF) 0.9 % IJ SOLN
INTRAMUSCULAR | Status: AC
  Filled 2022-12-18: qty 50

## 2022-12-18 MED ORDER — HEPARIN SOD (PORK) LOCK FLUSH 100 UNIT/ML IV SOLN
500.0000 [IU] | Freq: Once | INTRAVENOUS | Status: AC
  Administered 2022-12-18: 500 [IU] via INTRAVENOUS

## 2022-12-18 MED ORDER — IOHEXOL 300 MG/ML  SOLN
100.0000 mL | Freq: Once | INTRAMUSCULAR | Status: AC | PRN
  Administered 2022-12-18: 100 mL via INTRAVENOUS

## 2022-12-18 MED ORDER — HEPARIN SOD (PORK) LOCK FLUSH 100 UNIT/ML IV SOLN
INTRAVENOUS | Status: AC
  Filled 2022-12-18: qty 5

## 2022-12-18 MED ORDER — SODIUM CHLORIDE 0.9% FLUSH
10.0000 mL | Freq: Once | INTRAVENOUS | Status: AC
  Administered 2022-12-18: 10 mL

## 2022-12-20 ENCOUNTER — Inpatient Hospital Stay: Payer: Medicaid Other

## 2022-12-20 ENCOUNTER — Other Ambulatory Visit: Payer: Self-pay | Admitting: Hematology and Oncology

## 2022-12-20 ENCOUNTER — Encounter: Payer: Self-pay | Admitting: Hematology and Oncology

## 2022-12-20 ENCOUNTER — Inpatient Hospital Stay (HOSPITAL_BASED_OUTPATIENT_CLINIC_OR_DEPARTMENT_OTHER): Payer: Medicaid Other | Admitting: Hematology and Oncology

## 2022-12-20 VITALS — BP 150/84 | HR 69 | Temp 97.8°F | Resp 18 | Ht 62.0 in | Wt 196.2 lb

## 2022-12-20 DIAGNOSIS — D6481 Anemia due to antineoplastic chemotherapy: Secondary | ICD-10-CM

## 2022-12-20 DIAGNOSIS — C541 Malignant neoplasm of endometrium: Secondary | ICD-10-CM

## 2022-12-20 DIAGNOSIS — T451X5A Adverse effect of antineoplastic and immunosuppressive drugs, initial encounter: Secondary | ICD-10-CM | POA: Diagnosis not present

## 2022-12-20 DIAGNOSIS — C778 Secondary and unspecified malignant neoplasm of lymph nodes of multiple regions: Secondary | ICD-10-CM | POA: Diagnosis not present

## 2022-12-20 DIAGNOSIS — I10 Essential (primary) hypertension: Secondary | ICD-10-CM | POA: Diagnosis not present

## 2022-12-20 DIAGNOSIS — Z5112 Encounter for antineoplastic immunotherapy: Secondary | ICD-10-CM | POA: Diagnosis present

## 2022-12-20 LAB — CBC WITH DIFFERENTIAL (CANCER CENTER ONLY)
Abs Immature Granulocytes: 0.01 10*3/uL (ref 0.00–0.07)
Basophils Absolute: 0 10*3/uL (ref 0.0–0.1)
Basophils Relative: 0 %
Eosinophils Absolute: 0 10*3/uL (ref 0.0–0.5)
Eosinophils Relative: 1 %
HCT: 32.8 % — ABNORMAL LOW (ref 36.0–46.0)
Hemoglobin: 11 g/dL — ABNORMAL LOW (ref 12.0–15.0)
Immature Granulocytes: 0 %
Lymphocytes Relative: 33 %
Lymphs Abs: 1.8 10*3/uL (ref 0.7–4.0)
MCH: 30.7 pg (ref 26.0–34.0)
MCHC: 33.5 g/dL (ref 30.0–36.0)
MCV: 91.6 fL (ref 80.0–100.0)
Monocytes Absolute: 0.6 10*3/uL (ref 0.1–1.0)
Monocytes Relative: 11 %
Neutro Abs: 3 10*3/uL (ref 1.7–7.7)
Neutrophils Relative %: 55 %
Platelet Count: 322 10*3/uL (ref 150–400)
RBC: 3.58 MIL/uL — ABNORMAL LOW (ref 3.87–5.11)
RDW: 15 % (ref 11.5–15.5)
WBC Count: 5.4 10*3/uL (ref 4.0–10.5)
nRBC: 0 % (ref 0.0–0.2)

## 2022-12-20 MED ORDER — SODIUM CHLORIDE 0.9% FLUSH: 3.0000 mL | INTRAVENOUS | Status: DC | PRN

## 2022-12-20 MED ORDER — SODIUM CHLORIDE 0.9 % IV SOLN
200.0000 mg | Freq: Once | INTRAVENOUS | Status: AC
  Administered 2022-12-20: 200 mg via INTRAVENOUS
  Filled 2022-12-20: qty 200

## 2022-12-20 MED ORDER — ALTEPLASE 2 MG IJ SOLR: 2.0000 mg | Freq: Once | INTRAMUSCULAR | Status: DC | PRN

## 2022-12-20 MED ORDER — HEPARIN SOD (PORK) LOCK FLUSH 100 UNIT/ML IV SOLN
500.0000 [IU] | Freq: Once | INTRAVENOUS | Status: AC | PRN
  Administered 2022-12-20: 500 [IU]

## 2022-12-20 MED ORDER — SODIUM CHLORIDE 0.9 % IV SOLN: Freq: Once | INTRAVENOUS | Status: AC

## 2022-12-20 MED ORDER — SODIUM CHLORIDE 0.9% FLUSH
10.0000 mL | INTRAVENOUS | Status: DC | PRN
  Administered 2022-12-20: 10 mL

## 2022-12-20 MED ORDER — HEPARIN SOD (PORK) LOCK FLUSH 100 UNIT/ML IV SOLN: 250.0000 [IU] | Freq: Once | INTRAVENOUS | Status: DC | PRN

## 2022-12-20 NOTE — Assessment & Plan Note (Signed)
Her blood pressure is suboptimally controlled We discussed importance of getting her blood pressure well-controlled while on Lenvima Recommend the patient lifestyle changes, dietary modification and to follow-up with the primary care doctor for blood pressure medication changes

## 2022-12-20 NOTE — Assessment & Plan Note (Signed)
I have reviewed multiple imaging studies with the patient and her daughter She has stable changes on CT, overall good response The changes seen in the vaginal cuff is stable and that could account for her intermittent vaginal discharge I recommend we continue treatment for a few more months with plan to repeat CT imaging in April

## 2022-12-20 NOTE — Patient Instructions (Signed)
[facility]  Discharge Instructions: Thank you for choosing [facility] to provide your oncology and hematology care.   If you have a lab appointment with the Cancer Center, please go directly to the Cancer Center and check in at the registration area.   Wear comfortable clothing and clothing appropriate for easy access to any Portacath or PICC line.   We strive to give you quality time with your provider. You may need to reschedule your appointment if you arrive late (15 or more minutes).  Arriving late affects you and other patients whose appointments are after yours.  Also, if you miss three or more appointments without notifying the office, you may be dismissed from the clinic at the provider's discretion.      For prescription refill requests, have your pharmacy contact our office and allow 72 hours for refills to be completed.    Today you received the following chemotherapy and/or immunotherapy agents: Keytruda      To help prevent nausea and vomiting after your treatment, we encourage you to take your nausea medication as directed.  BELOW ARE SYMPTOMS THAT SHOULD BE REPORTED IMMEDIATELY: *FEVER GREATER THAN 100.4 F (38 C) OR HIGHER *CHILLS OR SWEATING *NAUSEA AND VOMITING THAT IS NOT CONTROLLED WITH YOUR NAUSEA MEDICATION *UNUSUAL SHORTNESS OF BREATH *UNUSUAL BRUISING OR BLEEDING *URINARY PROBLEMS (pain or burning when urinating, or frequent urination) *BOWEL PROBLEMS (unusual diarrhea, constipation, pain near the anus) TENDERNESS IN MOUTH AND THROAT WITH OR WITHOUT PRESENCE OF ULCERS (sore throat, sores in mouth, or a toothache) UNUSUAL RASH, SWELLING OR PAIN  UNUSUAL VAGINAL DISCHARGE OR ITCHING   Items with * indicate a potential emergency and should be followed up as soon as possible or go to the Emergency Department if any problems should occur.  Please show the CHEMOTHERAPY ALERT CARD or IMMUNOTHERAPY ALERT CARD at check-in to  the Emergency Department and triage nurse.  Should you have questions after your visit or need to cancel or reschedule your appointment, please contact [facility]  Dept: [phone]  and follow the prompts.  Office hours are 8:00 a.m. to 4:30 p.m. Monday - Friday. Please note that voicemails left after 4:00 p.m. may not be returned until the following business day.  We are closed weekends and major holidays. You have access to a nurse at all times for urgent questions. Please call the main number to the clinic Dept: [phone] and follow the prompts.   For any non-urgent questions, you may also contact your provider using MyChart. We now offer e-Visits for anyone 18 and older to request care online for non-urgent symptoms. For details visit [email or web address].   Also download the MyChart app! Go to the app store, search "MyChart", open the app, select , and log in with your MyChart username and password.   

## 2022-12-20 NOTE — Assessment & Plan Note (Signed)
She has multifactorial anemia, component of anemia chronic illness and from prior treatment Observe closely 

## 2022-12-20 NOTE — Progress Notes (Signed)
[place] OFFICE PROGRESS NOTE  Patient Care Team: [name], MD as PCP - General (Family Medicine) Croitoru, Dani Gobble, MD as PCP - Cardiology (Cardiology)  ASSESSMENT & PLAN:  Papillary serous adenocarcinoma of endometrium [facility]) I have reviewed multiple imaging studies with the patient and her daughter She has stable changes on CT, overall good response The changes seen in the vaginal cuff is stable and that could account for her intermittent vaginal discharge I recommend we continue treatment for a few more months with plan to repeat CT imaging in April  Anemia due to antineoplastic chemotherapy She has multifactorial anemia, component of anemia chronic illness and from prior treatment Observe closely  Essential hypertension Her blood pressure is suboptimally controlled We discussed importance of getting her blood pressure well-controlled while on Lenvima Recommend the patient lifestyle changes, dietary modification and to follow-up with the primary care doctor for blood pressure medication changes  Orders Placed This Encounter  Procedures   CBC with Differential ([place] Only)    Standing Status:   Future    Standing Expiration Date:   02/01/2024   CMP ([place] only)    Standing Status:   Future    Standing Expiration Date:   02/01/2024   TSH    Standing Status:   Standing    Number of Occurrences:   22    Standing Expiration Date:   12/21/2023   Total Protein, Urine dipstick    Standing Status:   Standing    Number of Occurrences:   22    Standing Expiration Date:   12/21/2023    All questions were answered. The patient knows to call the clinic with any problems, questions or concerns. The total time spent in the appointment was 30 minutes encounter with patients including review of chart and various tests results, discussions about plan of care and coordination of care plan   Heath Lark, MD 12/20/2022 10:46 AM  INTERVAL HISTORY: Please see  below for problem oriented charting. she returns for treatment follow-up with her daughter She is doing well She continues to have intermittent vaginal discharge, stable Her documented blood pressure from home in general range around 150  REVIEW OF SYSTEMS:   Constitutional: Denies fevers, chills or abnormal weight loss Eyes: Denies blurriness of vision Ears, nose, mouth, throat, and face: Denies mucositis or sore throat Respiratory: Denies cough, dyspnea or wheezes Cardiovascular: Denies palpitation, chest discomfort or lower extremity swelling Gastrointestinal:  Denies nausea, heartburn or change in bowel habits Skin: Denies abnormal skin rashes Lymphatics: Denies new lymphadenopathy or easy bruising Neurological:Denies numbness, tingling or new weaknesses Behavioral/Psych: Mood is stable, no new changes  All other systems were reviewed with the patient and are negative.  I have reviewed the past medical history, past surgical history, social history and family history with the patient and they are unchanged from previous note.  ALLERGIES:  has No Known Allergies.  MEDICATIONS:  Current Outpatient Medications  Medication Sig Dispense Refill   amLODipine (NORVASC) 10 MG tablet Take 1 tablet (10 mg total) by mouth daily. 90 tablet 0   aspirin 81 MG EC tablet Take 1 tablet (81 mg total) by mouth daily. 30 tablet 12   atorvastatin (LIPITOR) 80 MG tablet TAKE 1 TABLET (80 MG TOTAL) BY MOUTH AT BEDTIME. 30 tablet 6   Dulaglutide (TRULICITY) 1.5 VP/7.1GG SOPN Inject 1.5 mg into the skin once a week. 2 mL 6   glimepiride (AMARYL) 4 MG tablet Take 1 tablet (4 mg  total) by mouth daily with breakfast. 30 tablet 6   isosorbide mononitrate (IMDUR) 30 MG 24 hr tablet Take 2 tablets (60 mg total) by mouth daily. 60 tablet 6   lenvatinib 10 mg daily dose (LENVIMA, 10 MG DAILY DOSE,) capsule Take 1 capsule (10 mg total) by mouth daily. 30 capsule 11   lisinopril (ZESTRIL) 10 MG tablet Take 3  tablets (30 mg total) by mouth daily. 90 tablet 0   metFORMIN (GLUCOPHAGE) 500 MG tablet Take 2 tablets (1,000 mg total) by mouth 2 (two) times daily with a meal. 120 tablet 6   metoprolol tartrate (LOPRESSOR) 100 MG tablet TAKE 1 TABLET (100 MG TOTAL) BY MOUTH 2 (TWO) TIMES DAILY. 60 tablet 6   nitroGLYCERIN (NITROSTAT) 0.4 MG SL tablet Place 1 tablet (0.4 mg total) under the tongue every 5 (five) minutes as needed for chest pain. 25 tablet 2   No current facility-administered medications for this visit.   Facility-Administered Medications Ordered in Other Visits  Medication Dose Route Frequency Provider Last Rate Last Admin   alteplase (CATHFLO ACTIVASE) injection 2 mg  2 mg Intracatheter Once PRN Alvy Bimler, Francely Craw, MD       heparin lock flush 100 unit/mL  500 Units Intracatheter Once PRN Alvy Bimler, Millena Callins, MD       heparin lock flush 100 unit/mL  250 Units Intracatheter Once PRN Alvy Bimler, Jamas Jaquay, MD       pembrolizumab (KEYTRUDA) 200 mg in sodium chloride 0.9 % 50 mL chemo infusion  200 mg Intravenous Once Heath Lark, MD 116 mL/hr at 12/20/22 1031 200 mg at 12/20/22 1031   sodium chloride flush (NS) 0.9 % injection 10 mL  10 mL Intracatheter PRN Alvy Bimler, Waylen Depaolo, MD       sodium chloride flush (NS) 0.9 % injection 3 mL  3 mL Intracatheter PRN Heath Lark, MD        SUMMARY OF ONCOLOGIC HISTORY: Oncology History Overview Note  Pap 12/14/20: adenocarcinoma, HPV negative  HER-2 positive by FISH Her-2 equivocal by [facility]  MSI stable    Papillary serous adenocarcinoma of endometrium ([place])   Initial Diagnosis   Endometrial carcinoma ([place])   01/12/2021 Initial Biopsy   A. ENDOCERVIX, CURETTAGE:  - Adenocarcinoma.  B. ENDOMETRIUM, BIOPSY:  - Adenocarcinoma.  COMMENT:  The differential diagnosis includes high grade serous carcinoma.  Both  specimens have a similar morphology; high grade serous carcinoma more  commonly arises in the endometrium.  Results reported to Dr. Hale Bogus  on 01/15/2021.  Dr.  Saralyn Pilar reviewed the case.    01/16/2021 Tumor Marker   Patient's tumor was tested for the following markers: CA-125 Results of the tumor marker test revealed normal value, 10.7   01/25/2021 Imaging   CT C/A/P: 1. Small volume fluid in the endometrial canal with 7 mm hypoattenuating lesion in the myometrium of the anterior fundus. 2. No definite evidence for metastatic disease in the chest, abdomen, or pelvis. Small lymph nodes are noted along both pelvic sidewalls. Attention on follow-up recommended. 3. 5 mm ground-glass opacity peripheral left upper lobe. This is likely related to infection/inflammation and potentially scar. Attention on surveillance imaging recommended. 4. 5.8 cm lesion posterior lower uterine segment likely a fibroid. Ultrasound exam from 01/28/2010 demonstrated a 5.2 cm lesion in the left aspect of the posterior lower uterine body. 5. Aortic Atherosclerosis (ICD10-I70.0).   01/29/2021 Surgery   TRH/BSO, SLN biopsy left, selective pelvic LND right, omentectomy  Findings: On EUA, 10-12cm enlarged mobile uterus. On intra-abdomina entry, minimal adhesions  between the liver and anterior abdomen on the right. Some changes c/w fatty liver on the left. Omentum, stomach, small and large bowel all grossly normal. Bilateral ovaries normal appearing. Uterus with 5-6cm posterior fibroid, otherwise normal appearing. No mapping to right pelvis. On left, mapping to the level of the superior vessel artery, enlarged lymph node noted (likely sentinel) within the upper aspect of the obturator space. In bilateral pelvic basins, multiple enlarged lymph nodes. On the right, enlarged lymph nodes extended superiorly along the common iliac vessels. At the end of surgery, no obvious abdominal or pelvic evidence of disease.   01/29/2021 Pathology Results   A. SENTINEL LYMPH NODE, LEFT INTERNAL ILIAC, BIOPSY:  - Metastatic carcinoma in (1) of (1) lymph node.   B. SENTINEL LYMPH NODE, LEFT  OBTURATOR, BIOPSY:  - Metastatic carcinoma in (1) of (1) lymph node.   C. LYMPH NODES, LEFT PELVIC, DISSECTION:  - Metastatic carcinoma in (1) of (3) lymph nodes.   D. LYMPH NODE, RIGHT EXTERNAL AND COMMON ILIAC, BIOPSY:  - Metastatic carcinoma in (1) of (1) lymph node.   E. UTERUS, CERVIX AND BILATERAL FALLOPIAN TUBES AND OVARIES, TOTAL  HYSTERECTOMY AND BILATERAL SALPINGO-OOPHORECTOMY:  - High grade serous carcinoma of endometrium, with invasion more than  half of the myometrium.  - Tumor invades the stromal connective tissue of the cervix.  - No involvement of uterine serosa or adnexa.  - Lymphovascular invasion is identified.  - See oncology table.   F. OMENTUM, OMENTECTOMY:  - Omentum, negative for carcinoma.   G. LYMPH NODES, RIGHT PELVIC, DISSECTION:  - Two lymph nodes, negative for carcinoma (0/2).   ONCOLOGY TABLE:   UTERUS, CARCINOMA OR CARCINOSARCOMA: Resection   Procedure: Total hysterectomy and bilateral salpingo-oophorectomy,  Omentectomy, Lymph node sampling  Histologic Type: Serous carcinoma  Histologic Grade: High grade  Myometrial Invasion: > 50%  Uterine Serosa Involvement: Not identified  Cervical Stroma Involvement: Present  Other Tissue/Organ Involvement: Not identified  Peritoneal/Ascitic Fluid: Negative for carcinoma  Lymphovascular Invasion: Present  Regional Lymph Nodes:       Pelvic Lymph Nodes Examined:            2 Sentinel            6 Non-Sentinel            8 Total       Pelvic Lymph Nodes with Metastasis: 4            Macrometastasis: 3            Micrometastasis: 1            Isolated Tumor Cells: 0            Laterality of Lymph Nodes with Tumor: Right (non-sentinel),  Left (sentinel and non-sentinel)            Extracapsular Extension: Present       Para-Aortic Lymph Nodes Examined: Not applicable        Para-Aortic Lymph Nodes with Metastasis: Not applicable  Distant Metastasis:       Distant Site(s) Involved: Omentum: Not  involved  Pathologic Stage Classification (pTNM, AJCC 8th Edition): pT2, pN1a  Ancillary Studies: HER2 will be ordered  Additional Findings: Leiomyomata.  Adenomyosis.  Representative Tumor Block: B1  Comment: Dr. Saralyn Pilar reviewed select slides.  (v4.2.0.1)    01/29/2021 Cancer Staging   Staging form: Corpus Uteri - Carcinoma and Carcinosarcoma, AJCC 8th Edition - Clinical stage from 01/29/2021: FIGO Stage IIIC1 (cT1b, cN1a, cM0) - Signed  by Lafonda Mosses, MD on 02/02/2021 Histopathologic type: Mixed cell adenocarcinoma Stage prefix: Initial diagnosis Method of lymph node assessment: Other Histologic grade (G): G3 Histologic grading system: 3 grade system Lymph-vascular invasion (LVI): LVI present/identified, NOS Peritoneal cytology results: Negative Pelvic nodal status: Positive Number of pelvic nodes positive from dissection: 4 Number of pelvic nodes examined during dissection: 8 Para-aortic status: Not assessed Lymph node metastasis: Present Omentectomy performed: Yes Morcellation performed: No   02/19/2021 Echocardiogram    1. Left ventricular ejection fraction, by estimation, is 55 to 60%. The left ventricle has normal function. The left ventricle demonstrates regional wall motion abnormalities (see scoring diagram/findings for description). There is mild left ventricular  hypertrophy. Left ventricular diastolic parameters are consistent with Grade I diastolic dysfunction (impaired relaxation). There is moderate hypokinesis of the left ventricular, basal septal wall and inferior wall. The average left ventricular global longitudinal strain is -17.9 %. The global longitudinal strain is abnormal.  2. Right ventricular systolic function is normal. The right ventricular size is normal. There is normal pulmonary artery systolic pressure. The estimated right ventricular systolic pressure is 82.5 mmHg.  3. The mitral valve is abnormal. Trivial mitral valve regurgitation.  4. The aortic  valve is tricuspid. Aortic valve regurgitation is not visualized.  5. The inferior vena cava is normal in size with greater than 50% respiratory variability, suggesting right atrial pressure of 3 mmHg.   02/20/2021 Procedure   Successful placement of a right IJ approach Power Port with ultrasound and fluoroscopic guidance. The catheter is ready for use.   02/28/2021 - 06/14/2021 Chemotherapy    Patient is on Treatment Plan: UTERINE CARBOPLATIN AUC 6 / PACLITAXEL Q21D       04/20/2021 Imaging   Status post hysterectomy and suspected bilateral salpingo-oophorectomy.   2.2 cm fluid density lesion along the left pelvic sidewall likely reflects a postoperative seroma.   No findings suspicious for recurrent or metastatic disease.   07/16/2021 - 08/16/2021 Radiation Therapy   Radiation Treatment Dates: 07/16/2021 through 08/16/2021 Site Technique Total Dose (Gy) Dose per Fx (Gy) Completed Fx Beam Energies  Vagina: Pelvis HDR-brachy 30/30 6 5/5 Ir-192        10/01/2021 Imaging   Increasing size of LEFT pelvic lymph nodes, largest approximately 11 mm along the external iliac chain. Given findings and history could consider PET for further evaluation as warranted.   Cystic area along the LEFT pelvic sidewall that was seen previously has improved.   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   Aortic Atherosclerosis (ICD10-I70.0).   12/21/2021 Imaging   1. Enlarging soft tissue masses involving the vaginal cuff. 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 Speculum examination and re-biopsy may be confirmatory. PET-CT may be helpful. 2. New small sub 5 mm perirectal and sigmoid mesocolon nodes. 3. Slight progression of pelvic adenopathy as detailed above. 4. No findings suspicious for abdominal metastatic disease or osseous metastatic disease. 5. Stable age advanced atherosclerotic  calcifications involving the aorta and branch vessels and coronary arteries   12/25/2021 Echocardiogram    1. Global longitudinal strain is -16.1% Mild basal inferior hypoinesis. Compared to echo from 02/2021, no change in overall LVEF /regional wall motion. Global longitudinal strain is mildly less negative (-17.9% to -16.1%. Left ventricular ejection fraction, by estimation, is 65 to 70%. The left ventricle has normal function. The left ventricle has no regional wall motion abnormalities. There is  mild left ventricular hypertrophy. Left ventricular diastolic parameters are indeterminate.  2. Right ventricular systolic function is normal. The right ventricular size is normal. There is normal pulmonary artery systolic pressure.  3. Trivial mitral valve regurgitation.  4. The aortic valve is normal in structure. Aortic valve regurgitation is not visualized.  5. The inferior vena cava is normal in size with greater than 50% respiratory variability, suggesting right atrial pressure of 3 mmHg.     01/01/2022 - 01/22/2022 Chemotherapy   Patient is on Treatment Plan : UTERINE SEROUS CARCINOMA Carboplatin + Paclitaxel + Trastuzumab q21d x 6 Cycles / Trastuzumab q21d     03/29/2022 Pathology Results   A.   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   COMMENT:   The carcinoma has diffuse strong Pax8 immunoreactivity (Mullerian marker) and abnormal overexpression of nuclear p53 and relatively diffuse strong p16 staining.  Estrogen receptor (ER) is weakly reactive  and there is focal strong Napsin A immunoreactivity.  The immunophenotype is most characteristic of serous carcinoma.   The history of prior treatment for endometrial serous carcinoma is noted.   04/12/2022 Imaging   1. Surgical change of prior hysterectomy with increased nodular thickening at the vaginal cuff,  suspicious for recurrent disease. Consider further evaluation with PET-CT versus direct tissue sampling. 2. Unchanged prominent/mildly enlarged retroperitoneal and pelvic lymph nodes, nonspecific but in the setting of disease recurrence these would be at least somewhat suspicious for nodal involvement. Consider attention on follow-up imaging. 3. No evidence of metastatic disease within the chest. 4. Left-sided colonic diverticulosis without findings of acute diverticulitis. 5.  Aortic Atherosclerosis (ICD10-I70.0).   04/16/2022 - 08/09/2022 Chemotherapy   Patient is on Treatment Plan : UTERINE Lenvatinib (20) D1-21 + Pembrolizumab (200) D1 q21d     04/16/2022 -  Chemotherapy   Patient is on Treatment Plan : UTERINE Lenvatinib (20) D1-21 + Pembrolizumab (200) D1 q21d     07/09/2022 Imaging   Mild increase in mild left para-aortic and left iliac lymphadenopathy, consistent with metastatic lymphadenopathy.   Stable nodular thickening of vaginal cuff, consistent with vaginal recurrence.   Colonic diverticulosis, without radiographic evidence of diverticulitis.   Probable tiny calcified gallstone. No radiographic evidence of cholecystitis.   Aortic Atherosclerosis (ICD10-I70.0).     09/12/2022 Imaging   1. Mild decrease in thickening along the vaginal cuff. 2. LEFT iliac and LEFT periaortic retroperitoneal adenopathy is stable to slightly decreased. 3. No evidence of new lymphadenopathy or progressive metastatic uterine carcinoma.      12/19/2022 Imaging   Persistent thickening of the walls of the vaginal cuff with some luminal fluid and air. The focal nodular component measured previously is less well-defined today relative to the adjacent wall thickening.   Persistent lymph node enlargement in the retroperitoneum and left pelvic sidewall. Some areas are similar and others are slightly increased. No new areas of lymph node enlargement.    Gallstones.       PHYSICAL EXAMINATION: ECOG  PERFORMANCE STATUS: 1 - Symptomatic but completely ambulatory  Vitals:   12/20/22 0929  BP: (!) 150/84  Pulse: 69  Resp: 18  Temp: 97.8 F (36.6 C)  SpO2: 100%   Filed Weights   12/20/22 0929  Weight: 196 lb 3.2 oz (89 kg)    GENERAL:alert, no distress and comfortable NEURO: alert & oriented x 3 with fluent speech, no focal motor/sensory deficits  LABORATORY DATA:  I have reviewed the data as listed    Component Value Date/Time   NA 139 12/18/2022 0932   NA 143 10/18/2020 1028   K 3.9 12/18/2022 0932   CL 103 12/18/2022 0932   CO2 26 12/18/2022 0932   GLUCOSE 113 (H) 12/18/2022 0932   BUN 12 12/18/2022 0932   BUN 13 10/18/2020 1028   CREATININE 1.04 (H) 12/18/2022 0932   CREATININE 0.79 02/11/2017 0943   CALCIUM 10.0 12/18/2022 0932   PROT 7.2 12/18/2022 0932   PROT 6.7 10/18/2020 1028   ALBUMIN 3.7 12/18/2022 0932   ALBUMIN 4.1 10/18/2020 1028   AST 17 12/18/2022 0932   ALT 15 12/18/2022 0932   ALKPHOS 83 12/18/2022 0932   BILITOT 0.5 12/18/2022 0932   GFRNONAA >60 12/18/2022 0932   GFRNONAA 83 02/11/2017 0943   GFRAA 70 10/18/2020 1028   GFRAA >89 02/11/2017 0943    No results found for: "SPEP", "UPEP"  Lab Results  Component Value Date   WBC 5.4 12/20/2022   NEUTROABS 3.0 12/20/2022   HGB 11.0 (L) 12/20/2022   HCT 32.8 (L) 12/20/2022   MCV 91.6 12/20/2022   PLT 322 12/20/2022      Chemistry      Component Value Date/Time   NA 139 12/18/2022 0932   NA 143 10/18/2020 1028   K 3.9 12/18/2022 0932   CL 103 12/18/2022 0932   CO2 26 12/18/2022 0932   BUN 12 12/18/2022 0932   BUN 13 10/18/2020 1028   CREATININE 1.04 (H) 12/18/2022 0932   CREATININE 0.79 02/11/2017 0943      Component Value Date/Time   CALCIUM 10.0 12/18/2022 0932   ALKPHOS 83 12/18/2022 0932   AST 17 12/18/2022 0932   ALT 15 12/18/2022 0932   BILITOT 0.5 12/18/2022 0932       RADIOGRAPHIC STUDIES: I have personally reviewed the radiological images as listed and agreed  with the findings in the report. CT ABDOMEN PELVIS W CONTRAST  Result Date: 12/19/2022 CLINICAL DATA:  Cervical cancer restaging. Papillary serous adenocarcinoma of the endometrium. * Tracking Code: BO * EXAM: CT ABDOMEN AND PELVIS WITH CONTRAST TECHNIQUE: Multidetector CT imaging of the abdomen and pelvis was performed using the standard protocol following bolus administration of intravenous contrast. RADIATION DOSE REDUCTION: This exam was performed according to the departmental dose-optimization program which includes automated exposure control, adjustment of the mA and/or kV according to patient size and/or use of iterative reconstruction technique. CONTRAST:  129m OMNIPAQUE IOHEXOL 300 MG/ML  SOLN COMPARISON:  09/10/2022 and older FINDINGS: Lower chest: No pleural effusion at the lung bases. There is linear opacity at the right lung base likely scar or atelectasis. Coronary artery calcifications are seen. Please correlate for other coronary risk factors. Hepatobiliary: No enhancing liver lesion. Gallbladder contains some dependent small stones. The gallbladder is nondilated. Patent portal vein. Pancreas: Preserved pancreatic parenchyma. Slight ectasia of the pancreatic duct in the neck and body, unchanged from prior. No focal pancreatic mass. Spleen: Spleen is nonenlarged. Adrenals/Urinary Tract: Lobular contour of each kidney, nonspecific. No enhancing renal mass. No collecting system dilatation. Bladder is relatively contracted. Stomach/Bowel: Large bowel has a normal course and caliber. Retrocecal appendix. Few left-sided colonic diverticula. Stomach is nondilated. Small bowel is nondilated. Vascular/Lymphatic: Normal caliber aorta with scattered vascular calcifications. IVC is preserved. Multiple abnormal nodes are again seen. Specific patient's O2 followed for continuity. Node caudal to the aortic bifurcation which previously had an AP diameter of 17 mm, today when measured in the  same fashion on axial  image 54 of series 2 the AP dimension of 2.2 cm, slightly larger. Left external iliac chain node which previously had a short axis measurement of 10 mm, today on series 2, image 70 has short axis dimension of 12 mm and longitudinal 18 mL. Left para-aortic node just below the left renal vessels which previously measured 3 axis of 11 mm, today on image 33 of series 2 has short axis dimension of 13 mm in longitudinal 14 mm. Node just caudal to this on the prior headed dimension previously 12 mm and today 11 mm, similar. Few other areas of prominent retroperitoneal nodes are identified. Similar distribution to the prior. There is also some additional left pelvic sidewall nodal tissue. Reproductive: Uterus is absent. There is thickening and nodularity along the upper vagina with some fluid and air in the vaginal cuff. This area was measured on the prior as is thickness approaching 23 mm on the prior and today when measuring in a similar fashion for continuity 24 mL. But the serosa focal area of nodularity which was measured at 9 mm which today is difficult to separate from the associated wall thickening. There is some adjacent stranding of the fat. Other: No free fluid. Musculoskeletal: Scattered degenerative changes of the spine and pelvis. IMPRESSION: Persistent thickening of the walls of the vaginal cuff with some luminal fluid and air. The focal nodular component measured previously is less well-defined today relative to the adjacent wall thickening. Persistent lymph node enlargement in the retroperitoneum and left pelvic sidewall. Some areas are similar and others are slightly increased. No new areas of lymph node enlargement. Gallstones. Electronically Signed   By: Jill Side M.D.   On: 12/19/2022 14:22

## 2022-12-22 ENCOUNTER — Other Ambulatory Visit: Payer: Self-pay

## 2022-12-23 ENCOUNTER — Other Ambulatory Visit: Payer: Self-pay | Admitting: Hematology and Oncology

## 2022-12-23 ENCOUNTER — Other Ambulatory Visit: Payer: Self-pay | Admitting: Family Medicine

## 2022-12-23 ENCOUNTER — Other Ambulatory Visit (HOSPITAL_COMMUNITY): Payer: Self-pay

## 2022-12-23 ENCOUNTER — Other Ambulatory Visit: Payer: Self-pay

## 2022-12-23 DIAGNOSIS — E1159 Type 2 diabetes mellitus with other circulatory complications: Secondary | ICD-10-CM

## 2022-12-23 DIAGNOSIS — I110 Hypertensive heart disease with heart failure: Secondary | ICD-10-CM

## 2022-12-23 DIAGNOSIS — E785 Hyperlipidemia, unspecified: Secondary | ICD-10-CM

## 2022-12-23 DIAGNOSIS — I10 Essential (primary) hypertension: Secondary | ICD-10-CM

## 2022-12-24 ENCOUNTER — Other Ambulatory Visit (HOSPITAL_COMMUNITY): Payer: Self-pay

## 2022-12-24 ENCOUNTER — Encounter (HOSPITAL_COMMUNITY): Payer: Self-pay

## 2022-12-24 ENCOUNTER — Other Ambulatory Visit: Payer: Self-pay

## 2022-12-24 MED ORDER — ISOSORBIDE MONONITRATE ER 30 MG PO TB24
60.0000 mg | ORAL_TABLET | Freq: Every day | ORAL | 0 refills | Status: DC
  Filled 2022-12-24: qty 180, 90d supply, fill #0

## 2022-12-24 MED ORDER — AMLODIPINE BESYLATE 10 MG PO TABS
10.0000 mg | ORAL_TABLET | Freq: Every day | ORAL | 0 refills | Status: DC
  Filled 2022-12-24: qty 90, 90d supply, fill #0

## 2022-12-24 MED ORDER — ATORVASTATIN CALCIUM 80 MG PO TABS
80.0000 mg | ORAL_TABLET | Freq: Every day | ORAL | 0 refills | Status: DC
  Filled 2022-12-24: qty 90, 90d supply, fill #0

## 2022-12-24 MED ORDER — METOPROLOL TARTRATE 100 MG PO TABS
100.0000 mg | ORAL_TABLET | Freq: Two times a day (BID) | ORAL | 0 refills | Status: DC
  Filled 2022-12-24: qty 180, 90d supply, fill #0

## 2022-12-24 MED ORDER — LISINOPRIL 10 MG PO TABS
30.0000 mg | ORAL_TABLET | Freq: Every day | ORAL | 0 refills | Status: DC
  Filled 2022-12-24: qty 90, 30d supply, fill #0

## 2022-12-24 MED ORDER — METFORMIN HCL 500 MG PO TABS
1000.0000 mg | ORAL_TABLET | Freq: Two times a day (BID) | ORAL | 0 refills | Status: DC
  Filled 2022-12-24: qty 360, 90d supply, fill #0

## 2022-12-24 NOTE — Telephone Encounter (Signed)
Requested Prescriptions  Pending Prescriptions Disp Refills   atorvastatin (LIPITOR) 80 MG tablet 90 tablet 0    Sig: TAKE 1 TABLET (80 MG TOTAL) BY MOUTH AT BEDTIME.     Cardiovascular:  Antilipid - Statins Failed - 12/23/2022  8:24 PM      Failed - Lipid Panel in normal range within the last 12 months    Cholesterol, Total  Date Value Ref Range Status  09/20/2021 153 100 - 199 mg/dL Final   LDL Chol Calc (NIH)  Date Value Ref Range Status  09/20/2021 79 0 - 99 mg/dL Final   HDL  Date Value Ref Range Status  09/20/2021 56 >39 mg/dL Final   Triglycerides  Date Value Ref Range Status  09/20/2021 101 0 - 149 mg/dL Final         Passed - Patient is not pregnant      Passed - Valid encounter within last 12 months    Recent Outpatient Visits           9 months ago Type 2 diabetes mellitus with other circulatory complication, without long-term current use of insulin ([place])   [place], [place], MD   1 year ago Type 2 diabetes mellitus with other circulatory complication, without long-term current use of insulin ([place])   [place], [place], MD   2 years ago Post-menopausal bleeding   [place], [name], MD   2 years ago Type 2 diabetes mellitus with other circulatory complication, without long-term current use of insulin ([place])   [place], [name], MD   2 years ago Type 2 diabetes mellitus with other circulatory complication, without long-term current use of insulin ([place])   [place], [place], MD               isosorbide mononitrate (IMDUR) 30 MG 24 hr tablet 180 tablet 0    Sig: Take 2 tablets (60 mg total) by mouth daily.     Cardiovascular:  Nitrates Failed - 12/23/2022  8:24 PM      Failed - Last BP in normal range    BP Readings from Last 1 Encounters:  12/20/22 (!) 150/84          Passed - Last Heart Rate in normal range    Pulse Readings from Last 1 Encounters:  12/20/22 69         Passed - Valid encounter within last 12 months    Recent Outpatient Visits           9 months ago Type 2 diabetes mellitus with other circulatory complication, without long-term current use of insulin ([place])   [place], [place], MD   1 year ago Type 2 diabetes mellitus with other circulatory complication, without long-term current use of insulin ([place])   [facility] [place], [name], MD   2 years ago Post-menopausal bleeding   [place], [name], MD   2 years ago Type 2 diabetes mellitus with other circulatory complication, without long-term current use of insulin ([place])   [place], [name], MD   2 years ago Type 2 diabetes mellitus with other circulatory complication, without long-term current use of insulin ([place])    [facility] [name], MD  metFORMIN (GLUCOPHAGE) 500 MG tablet 360 tablet 0    Sig: Take 2 tablets (1,000 mg total) by mouth 2 (two) times daily with a meal.     Endocrinology:  Diabetes - Biguanides Failed - 12/23/2022  8:24 PM      Failed - Cr in normal range and within 360 days    Creatinine  Date Value Ref Range Status  12/18/2022 1.04 (H) 0.44 - 1.00 mg/dL Final   Creat  Date Value Ref Range Status  02/11/2017 0.79 0.50 - 1.05 mg/dL Final    Comment:      For patients > or = 64 years of age: The upper reference limit for Creatinine is approximately 13% higher for people identified as African-American.      Creatinine, Urine  Date Value Ref Range Status  06/19/2019 268.44 mg/dL Final    Comment:    Performed at [facility] [address] [place], [place] [address]         Failed - HBA1C is between 0 and 7.9 and within 180 days    HbA1c,  POC (controlled diabetic range)  Date Value Ref Range Status  03/18/2022 7.3 (A) 0.0 - 7.0 % Final         Failed - Valid encounter within last 6 months    Recent Outpatient Visits           9 months ago Type 2 diabetes mellitus with other circulatory complication, without long-term current use of insulin ([place])   [place], [place], MD   1 year ago Type 2 diabetes mellitus with other circulatory complication, without long-term current use of insulin ([place])   [place], [place], MD   2 years ago Post-menopausal bleeding   [place], [name], MD   2 years ago Type 2 diabetes mellitus with other circulatory complication, without long-term current use of insulin ([place])   [place], [name], MD   2 years ago Type 2 diabetes mellitus with other circulatory complication, without long-term current use of insulin ([place])   [place], [place], MD              Passed - eGFR in normal range and within 360 days    GFR, Est African American  Date Value Ref Range Status  02/11/2017 >89 >=60 mL/min Final   GFR calc Af Amer  Date Value Ref Range Status  10/18/2020 70 >59 mL/min/1.73 Final    Comment:    **In accordance with recommendations from the NKF-ASN Task force,**   [facility] is in the process of updating its eGFR calculation to the   2021 CKD-EPI creatinine equation that estimates kidney function   without a race variable.    GFR, Est Non African American  Date Value Ref Range Status  02/11/2017 83 >=60 mL/min Final   GFR, Estimated  Date Value Ref Range Status  12/18/2022 >60 >60 mL/min Final    Comment:    (NOTE) Calculated using the CKD-EPI Creatinine Equation (2021)          Passed - B12 Level in normal range and within 720 days    Vitamin B-12  Date Value Ref Range Status   07/11/2021 323 180 - 914 pg/mL Final    Comment:    (NOTE) This assay is not validated for testing neonatal or myeloproliferative syndrome specimens for Vitamin B12  levels. Performed at [facility], [place] [address] [place], [place] [address]          Passed - CBC within normal limits and completed in the last 12 months    WBC  Date Value Ref Range Status  02/18/2022 3.0 (L) 4.0 - 10.5 K/uL Final   WBC Count  Date Value Ref Range Status  12/20/2022 5.4 4.0 - 10.5 K/uL Final   RBC  Date Value Ref Range Status  12/20/2022 3.58 (L) 3.87 - 5.11 MIL/uL Final   Hemoglobin  Date Value Ref Range Status  12/20/2022 11.0 (L) 12.0 - 15.0 g/dL Final   HCT  Date Value Ref Range Status  12/20/2022 32.8 (L) 36.0 - 46.0 % Final   MCHC  Date Value Ref Range Status  12/20/2022 33.5 30.0 - 36.0 g/dL Final   [facility]  Date Value Ref Range Status  12/20/2022 30.7 26.0 - 34.0 pg Final   MCV  Date Value Ref Range Status  12/20/2022 91.6 80.0 - 100.0 fL Final   No results found for: "PLTCOUNTKUC", "LABPLAT", "POCPLA" RDW  Date Value Ref Range Status  12/20/2022 15.0 11.5 - 15.5 % Final          metoprolol tartrate (LOPRESSOR) 100 MG tablet 180 tablet 0    Sig: TAKE 1 TABLET (100 MG TOTAL) BY MOUTH 2 (TWO) TIMES DAILY.     Cardiovascular:  Beta Blockers Failed - 12/23/2022  8:24 PM      Failed - Last BP in normal range    BP Readings from Last 1 Encounters:  12/20/22 (!) 150/84         Failed - Valid encounter within last 6 months    Recent Outpatient Visits           9 months ago Type 2 diabetes mellitus with other circulatory complication, without long-term current use of insulin ([place])   [place], [place], MD   1 year ago Type 2 diabetes mellitus with other circulatory complication, without long-term current use of insulin ([place])   [facility] [place], [place], MD   2 years ago Post-menopausal bleeding    [place], [name], MD   2 years ago Type 2 diabetes mellitus with other circulatory complication, without long-term current use of insulin ([place])   [place], [name], MD   2 years ago Type 2 diabetes mellitus with other circulatory complication, without long-term current use of insulin ([place])   [place], [place], MD              Passed - Last Heart Rate in normal range    Pulse Readings from Last 1 Encounters:  12/20/22 69          amLODipine (NORVASC) 10 MG tablet 90 tablet 0    Sig: Take 1 tablet (10 mg total) by mouth daily.     Cardiovascular: Calcium Channel Blockers 2 Failed - 12/23/2022  8:24 PM      Failed - Last BP in normal range    BP Readings from Last 1 Encounters:  12/20/22 (!) 150/84         Failed - Valid encounter within last 6 months    Recent Outpatient Visits           9 months ago Type 2 diabetes mellitus with other circulatory complication, without long-term current use of insulin (  [place])   [place], [place], MD   1 year ago Type 2 diabetes mellitus with other circulatory complication, without long-term current use of insulin ([facility])   [place], [name], MD   2 years ago Post-menopausal bleeding   [place], [name], MD   2 years ago Type 2 diabetes mellitus with other circulatory complication, without long-term current use of insulin ([place])   [place], [name], MD   2 years ago Type 2 diabetes mellitus with other circulatory complication, without long-term current use of insulin [facility])   [place], [place], MD              Passed - Last Heart Rate in normal range    Pulse Readings from Last 1 Encounters:  12/20/22 69

## 2022-12-27 ENCOUNTER — Other Ambulatory Visit: Payer: Self-pay

## 2022-12-31 ENCOUNTER — Other Ambulatory Visit (HOSPITAL_COMMUNITY): Payer: Self-pay

## 2023-01-10 ENCOUNTER — Other Ambulatory Visit: Payer: Self-pay

## 2023-01-10 ENCOUNTER — Inpatient Hospital Stay: Payer: Medicaid Other

## 2023-01-10 ENCOUNTER — Inpatient Hospital Stay (HOSPITAL_BASED_OUTPATIENT_CLINIC_OR_DEPARTMENT_OTHER): Payer: Medicaid Other | Admitting: Hematology and Oncology

## 2023-01-10 VITALS — BP 136/66 | HR 73 | Temp 98.0°F | Resp 18 | Ht 62.0 in | Wt 200.0 lb

## 2023-01-10 VITALS — BP 143/89 | HR 70 | Temp 98.0°F | Resp 18

## 2023-01-10 DIAGNOSIS — I10 Essential (primary) hypertension: Secondary | ICD-10-CM

## 2023-01-10 DIAGNOSIS — C541 Malignant neoplasm of endometrium: Secondary | ICD-10-CM

## 2023-01-10 DIAGNOSIS — Z5112 Encounter for antineoplastic immunotherapy: Secondary | ICD-10-CM | POA: Diagnosis not present

## 2023-01-10 LAB — CMP (CANCER CENTER ONLY)
ALT: 11 U/L (ref 0–44)
AST: 14 U/L — ABNORMAL LOW (ref 15–41)
Albumin: 3.3 g/dL — ABNORMAL LOW (ref 3.5–5.0)
Alkaline Phosphatase: 85 U/L (ref 38–126)
Anion gap: 8 (ref 5–15)
BUN: 23 mg/dL (ref 8–23)
CO2: 26 mmol/L (ref 22–32)
Calcium: 9.4 mg/dL (ref 8.9–10.3)
Chloride: 106 mmol/L (ref 98–111)
Creatinine: 1.16 mg/dL — ABNORMAL HIGH (ref 0.44–1.00)
GFR, Estimated: 53 mL/min — ABNORMAL LOW (ref >60)
Glucose, Bld: 107 mg/dL — ABNORMAL HIGH (ref 70–99)
Potassium: 4.7 mmol/L (ref 3.5–5.1)
Sodium: 140 mmol/L (ref 135–145)
Total Bilirubin: 0.4 mg/dL (ref 0.3–1.2)
Total Protein: 6.9 g/dL (ref 6.5–8.1)

## 2023-01-10 LAB — CBC WITH DIFFERENTIAL (CANCER CENTER ONLY)
Abs Immature Granulocytes: 0.02 10*3/uL (ref 0.00–0.07)
Basophils Absolute: 0 10*3/uL (ref 0.0–0.1)
Basophils Relative: 0 %
Eosinophils Absolute: 0.1 10*3/uL (ref 0.0–0.5)
Eosinophils Relative: 1 %
HCT: 33.8 % — ABNORMAL LOW (ref 36.0–46.0)
Hemoglobin: 11.4 g/dL — ABNORMAL LOW (ref 12.0–15.0)
Immature Granulocytes: 0 %
Lymphocytes Relative: 29 %
Lymphs Abs: 1.7 10*3/uL (ref 0.7–4.0)
MCH: 31.2 pg (ref 26.0–34.0)
MCHC: 33.7 g/dL (ref 30.0–36.0)
MCV: 92.6 fL (ref 80.0–100.0)
Monocytes Absolute: 0.6 10*3/uL (ref 0.1–1.0)
Monocytes Relative: 10 %
Neutro Abs: 3.6 10*3/uL (ref 1.7–7.7)
Neutrophils Relative %: 60 %
Platelet Count: 316 10*3/uL (ref 150–400)
RBC: 3.65 MIL/uL — ABNORMAL LOW (ref 3.87–5.11)
RDW: 15.1 % (ref 11.5–15.5)
WBC Count: 6 10*3/uL (ref 4.0–10.5)
nRBC: 0 % (ref 0.0–0.2)

## 2023-01-10 LAB — TSH: TSH: 2.9 u[IU]/mL (ref 0.350–4.500)

## 2023-01-10 LAB — TOTAL PROTEIN, URINE DIPSTICK: Protein, ur: 100 mg/dL — AB

## 2023-01-10 MED ORDER — SODIUM CHLORIDE 0.9% FLUSH
10.0000 mL | INTRAVENOUS | Status: DC | PRN
  Administered 2023-01-10: 10 mL

## 2023-01-10 MED ORDER — SODIUM CHLORIDE 0.9 % IV SOLN: Freq: Once | INTRAVENOUS | Status: AC

## 2023-01-10 MED ORDER — HEPARIN SOD (PORK) LOCK FLUSH 100 UNIT/ML IV SOLN
500.0000 [IU] | Freq: Once | INTRAVENOUS | Status: AC | PRN
  Administered 2023-01-10: 500 [IU]

## 2023-01-10 MED ORDER — SODIUM CHLORIDE 0.9 % IV SOLN
200.0000 mg | Freq: Once | INTRAVENOUS | Status: AC
  Administered 2023-01-10: 200 mg via INTRAVENOUS
  Filled 2023-01-10: qty 8

## 2023-01-10 MED ORDER — SODIUM CHLORIDE 0.9% FLUSH
10.0000 mL | Freq: Once | INTRAVENOUS | Status: AC
  Administered 2023-01-10: 10 mL

## 2023-01-10 MED ORDER — LIDOCAINE-PRILOCAINE 2.5-2.5 % EX CREA
1.0000 | TOPICAL_CREAM | CUTANEOUS | 0 refills | Status: DC | PRN
  Filled 2023-01-10: qty 30, 30d supply, fill #0

## 2023-01-11 ENCOUNTER — Encounter: Payer: Self-pay | Admitting: Hematology and Oncology

## 2023-01-11 NOTE — Progress Notes (Signed)
[place] OFFICE PROGRESS NOTE  Patient Care Team: [name], MD as PCP - General (Family Medicine) Croitoru, Dani Gobble, MD as PCP - Cardiology (Cardiology)  ASSESSMENT & PLAN:  Papillary serous adenocarcinoma of endometrium [facility]) She has stable changes on CT, overall good response The changes seen in the vaginal cuff is stable and that could account for her intermittent vaginal discharge I recommend we continue treatment for a few more months with plan to repeat CT imaging in April  Essential hypertension Previously, she had heavy proteinuria With better control of her blood pressure, her proteinuria and blood pressure readings are better We will continue current antihypertensives  No orders of the defined types were placed in this encounter.   All questions were answered. The patient knows to call the clinic with any problems, questions or concerns. The total time spent in the appointment was 20 minutes encounter with patients including review of chart and various tests results, discussions about plan of care and coordination of care plan   Heath Lark, MD 01/11/2023 9:33 AM  INTERVAL HISTORY: Please see below for problem oriented charting. she returns for treatment follow-up on Lenvima and pembrolizumab for recurrent uterine cancer Since last time I saw her, she felt fine No side effects from treatment She did not bring her blood pressure readings but she felt that her blood pressure at home were better  REVIEW OF SYSTEMS:   Constitutional: Denies fevers, chills or abnormal weight loss Eyes: Denies blurriness of vision Ears, nose, mouth, throat, and face: Denies mucositis or sore throat Respiratory: Denies cough, dyspnea or wheezes Cardiovascular: Denies palpitation, chest discomfort or lower extremity swelling Gastrointestinal:  Denies nausea, heartburn or change in bowel habits Skin: Denies abnormal skin rashes Lymphatics: Denies new lymphadenopathy or  easy bruising Neurological:Denies numbness, tingling or new weaknesses Behavioral/Psych: Mood is stable, no new changes  All other systems were reviewed with the patient and are negative.  I have reviewed the past medical history, past surgical history, social history and family history with the patient and they are unchanged from previous note.  ALLERGIES:  has No Known Allergies.  MEDICATIONS:  Current Outpatient Medications  Medication Sig Dispense Refill   lidocaine-prilocaine (EMLA) cream Apply 1 Application topically as needed. 30 g 0   amLODipine (NORVASC) 10 MG tablet Take 1 tablet (10 mg total) by mouth daily. 90 tablet 0   aspirin 81 MG EC tablet Take 1 tablet (81 mg total) by mouth daily. 30 tablet 12   atorvastatin (LIPITOR) 80 MG tablet Take 1 tablet (80 mg total) by mouth at bedtime. 90 tablet 0   Dulaglutide (TRULICITY) 1.5 QI/2.9NL SOPN Inject 1.5 mg into the skin once a week. 2 mL 6   glimepiride (AMARYL) 4 MG tablet Take 1 tablet (4 mg total) by mouth daily with breakfast. 30 tablet 6   isosorbide mononitrate (IMDUR) 30 MG 24 hr tablet Take 2 tablets (60 mg total) by mouth daily. 180 tablet 0   lenvatinib 10 mg daily dose (LENVIMA, 10 MG DAILY DOSE,) capsule Take 1 capsule (10 mg total) by mouth daily. 30 capsule 11   lisinopril (ZESTRIL) 10 MG tablet Take 3 tablets (30 mg total) by mouth daily. 90 tablet 0   metFORMIN (GLUCOPHAGE) 500 MG tablet Take 2 tablets (1,000 mg total) by mouth 2 (two) times daily with a meal. 360 tablet 0   metoprolol tartrate (LOPRESSOR) 100 MG tablet Take 1 tablet (100 mg total) by mouth 2 (two) times daily. 180 tablet  0   nitroGLYCERIN (NITROSTAT) 0.4 MG SL tablet Place 1 tablet (0.4 mg total) under the tongue every 5 (five) minutes as needed for chest pain. 25 tablet 2   No current facility-administered medications for this visit.    SUMMARY OF ONCOLOGIC HISTORY: Oncology History Overview Note  Pap 12/14/20: adenocarcinoma, HPV  negative  HER-2 positive by FISH Her-2 equivocal by [facility]  MSI stable    Papillary serous adenocarcinoma of endometrium ([place])   Initial Diagnosis   Endometrial carcinoma ([place])   01/12/2021 Initial Biopsy   A. ENDOCERVIX, CURETTAGE:  - Adenocarcinoma.  B. ENDOMETRIUM, BIOPSY:  - Adenocarcinoma.  COMMENT:  The differential diagnosis includes high grade serous carcinoma.  Both  specimens have a similar morphology; high grade serous carcinoma more  commonly arises in the endometrium.  Results reported to Dr. Hale Bogus  on 01/15/2021.  Dr. Saralyn Pilar reviewed the case.    01/16/2021 Tumor Marker   Patient's tumor was tested for the following markers: CA-125 Results of the tumor marker test revealed normal value, 10.7   01/25/2021 Imaging   CT C/A/P: 1. Small volume fluid in the endometrial canal with 7 mm hypoattenuating lesion in the myometrium of the anterior fundus. 2. No definite evidence for metastatic disease in the chest, abdomen, or pelvis. Small lymph nodes are noted along both pelvic sidewalls. Attention on follow-up recommended. 3. 5 mm ground-glass opacity peripheral left upper lobe. This is likely related to infection/inflammation and potentially scar. Attention on surveillance imaging recommended. 4. 5.8 cm lesion posterior lower uterine segment likely a fibroid. Ultrasound exam from 01/28/2010 demonstrated a 5.2 cm lesion in the left aspect of the posterior lower uterine body. 5. Aortic Atherosclerosis (ICD10-I70.0).   01/29/2021 Surgery   TRH/BSO, SLN biopsy left, selective pelvic LND right, omentectomy  Findings: On EUA, 10-12cm enlarged mobile uterus. On intra-abdomina entry, minimal adhesions between the liver and anterior abdomen on the right. Some changes c/w fatty liver on the left. Omentum, stomach, small and large bowel all grossly normal. Bilateral ovaries normal appearing. Uterus with 5-6cm posterior fibroid, otherwise normal appearing. No mapping to right  pelvis. On left, mapping to the level of the superior vessel artery, enlarged lymph node noted (likely sentinel) within the upper aspect of the obturator space. In bilateral pelvic basins, multiple enlarged lymph nodes. On the right, enlarged lymph nodes extended superiorly along the common iliac vessels. At the end of surgery, no obvious abdominal or pelvic evidence of disease.   01/29/2021 Pathology Results   A. SENTINEL LYMPH NODE, LEFT INTERNAL ILIAC, BIOPSY:  - Metastatic carcinoma in (1) of (1) lymph node.   B. SENTINEL LYMPH NODE, LEFT OBTURATOR, BIOPSY:  - Metastatic carcinoma in (1) of (1) lymph node.   C. LYMPH NODES, LEFT PELVIC, DISSECTION:  - Metastatic carcinoma in (1) of (3) lymph nodes.   D. LYMPH NODE, RIGHT EXTERNAL AND COMMON ILIAC, BIOPSY:  - Metastatic carcinoma in (1) of (1) lymph node.   E. UTERUS, CERVIX AND BILATERAL FALLOPIAN TUBES AND OVARIES, TOTAL  HYSTERECTOMY AND BILATERAL SALPINGO-OOPHORECTOMY:  - High grade serous carcinoma of endometrium, with invasion more than  half of the myometrium.  - Tumor invades the stromal connective tissue of the cervix.  - No involvement of uterine serosa or adnexa.  - Lymphovascular invasion is identified.  - See oncology table.   F. OMENTUM, OMENTECTOMY:  - Omentum, negative for carcinoma.   G. LYMPH NODES, RIGHT PELVIC, DISSECTION:  - Two lymph nodes, negative for carcinoma (0/2).  ONCOLOGY TABLE:   UTERUS, CARCINOMA OR CARCINOSARCOMA: Resection   Procedure: Total hysterectomy and bilateral salpingo-oophorectomy,  Omentectomy, Lymph node sampling  Histologic Type: Serous carcinoma  Histologic Grade: High grade  Myometrial Invasion: > 50%  Uterine Serosa Involvement: Not identified  Cervical Stroma Involvement: Present  Other Tissue/Organ Involvement: Not identified  Peritoneal/Ascitic Fluid: Negative for carcinoma  Lymphovascular Invasion: Present  Regional Lymph Nodes:       Pelvic Lymph Nodes Examined:             2 Sentinel            6 Non-Sentinel            8 Total       Pelvic Lymph Nodes with Metastasis: 4            Macrometastasis: 3            Micrometastasis: 1            Isolated Tumor Cells: 0            Laterality of Lymph Nodes with Tumor: Right (non-sentinel),  Left (sentinel and non-sentinel)            Extracapsular Extension: Present       Para-Aortic Lymph Nodes Examined: Not applicable        Para-Aortic Lymph Nodes with Metastasis: Not applicable  Distant Metastasis:       Distant Site(s) Involved: Omentum: Not involved  Pathologic Stage Classification (pTNM, AJCC 8th Edition): pT2, pN1a  Ancillary Studies: HER2 will be ordered  Additional Findings: Leiomyomata.  Adenomyosis.  Representative Tumor Block: B1  Comment: Dr. Saralyn Pilar reviewed select slides.  (v4.2.0.1)    01/29/2021 Cancer Staging   Staging form: Corpus Uteri - Carcinoma and Carcinosarcoma, AJCC 8th Edition - Clinical stage from 01/29/2021: FIGO Stage IIIC1 (cT1b, cN1a, cM0) - Signed by Lafonda Mosses, MD on 02/02/2021 Histopathologic type: Mixed cell adenocarcinoma Stage prefix: Initial diagnosis Method of lymph node assessment: Other Histologic grade (G): G3 Histologic grading system: 3 grade system Lymph-vascular invasion (LVI): LVI present/identified, NOS Peritoneal cytology results: Negative Pelvic nodal status: Positive Number of pelvic nodes positive from dissection: 4 Number of pelvic nodes examined during dissection: 8 Para-aortic status: Not assessed Lymph node metastasis: Present Omentectomy performed: Yes Morcellation performed: No   02/19/2021 Echocardiogram    1. Left ventricular ejection fraction, by estimation, is 55 to 60%. The left ventricle has normal function. The left ventricle demonstrates regional wall motion abnormalities (see scoring diagram/findings for description). There is mild left ventricular  hypertrophy. Left ventricular diastolic parameters are consistent with  Grade I diastolic dysfunction (impaired relaxation). There is moderate hypokinesis of the left ventricular, basal septal wall and inferior wall. The average left ventricular global longitudinal strain is -17.9 %. The global longitudinal strain is abnormal.  2. Right ventricular systolic function is normal. The right ventricular size is normal. There is normal pulmonary artery systolic pressure. The estimated right ventricular systolic pressure is 16.1 mmHg.  3. The mitral valve is abnormal. Trivial mitral valve regurgitation.  4. The aortic valve is tricuspid. Aortic valve regurgitation is not visualized.  5. The inferior vena cava is normal in size with greater than 50% respiratory variability, suggesting right atrial pressure of 3 mmHg.   02/20/2021 Procedure   Successful placement of a right IJ approach Power Port with ultrasound and fluoroscopic guidance. The catheter is ready for use.   02/28/2021 - 06/14/2021 Chemotherapy    Patient is on Treatment Plan:  UTERINE CARBOPLATIN AUC 6 / PACLITAXEL Q21D       04/20/2021 Imaging   Status post hysterectomy and suspected bilateral salpingo-oophorectomy.   2.2 cm fluid density lesion along the left pelvic sidewall likely reflects a postoperative seroma.   No findings suspicious for recurrent or metastatic disease.   07/16/2021 - 08/16/2021 Radiation Therapy   Radiation Treatment Dates: 07/16/2021 through 08/16/2021 Site Technique Total Dose (Gy) Dose per Fx (Gy) Completed Fx Beam Energies  Vagina: Pelvis HDR-brachy 30/30 6 5/5 Ir-192        10/01/2021 Imaging   Increasing size of LEFT pelvic lymph nodes, largest approximately 11 mm along the external iliac chain. Given findings and history could consider PET for further evaluation as warranted.   Cystic area along the LEFT pelvic sidewall that was seen previously has improved.   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   Aortic Atherosclerosis (ICD10-I70.0).   12/21/2021 Imaging   1. Enlarging soft tissue masses involving the vaginal cuff. 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 Speculum examination and re-biopsy may be confirmatory. PET-CT may be helpful. 2. New small sub 5 mm perirectal and sigmoid mesocolon nodes. 3. Slight progression of pelvic adenopathy as detailed above. 4. No findings suspicious for abdominal metastatic disease or osseous metastatic disease. 5. Stable age advanced atherosclerotic calcifications involving the aorta and branch vessels and coronary arteries   12/25/2021 Echocardiogram    1. Global longitudinal strain is -16.1% Mild basal inferior hypoinesis. Compared to echo from 02/2021, no change in overall LVEF /regional wall motion. Global longitudinal strain is mildly less negative (-17.9% to -16.1%. Left ventricular ejection fraction, by estimation, is 65 to 70%. The left ventricle has normal function. The left ventricle has no regional wall motion abnormalities. There is mild left ventricular hypertrophy. Left ventricular diastolic parameters are indeterminate.  2. Right ventricular systolic function is normal. The right ventricular size is normal. There is normal pulmonary artery systolic pressure.  3. Trivial mitral valve regurgitation.  4. The aortic valve is normal in structure. Aortic valve regurgitation is not visualized.  5. The inferior vena cava is normal in size with greater than 50% respiratory variability, suggesting right atrial pressure of 3 mmHg.     01/01/2022 - 01/22/2022 Chemotherapy   Patient is on Treatment Plan : UTERINE SEROUS CARCINOMA Carboplatin + Paclitaxel + Trastuzumab q21d x 6 Cycles / Trastuzumab q21d     03/29/2022 Pathology Results   A.   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   COMMENT:   The carcinoma has diffuse strong Pax8 immunoreactivity (Mullerian marker) and abnormal overexpression of nuclear p53 and relatively diffuse strong p16 staining.  Estrogen receptor (ER) is weakly reactive  and there is focal strong Napsin A immunoreactivity.  The immunophenotype is most characteristic of serous carcinoma.   The history of prior treatment for endometrial serous carcinoma is noted.   04/12/2022 Imaging   1. Surgical change of prior hysterectomy with increased nodular thickening at the vaginal cuff, suspicious for recurrent disease. Consider further evaluation with PET-CT versus direct tissue sampling. 2. Unchanged prominent/mildly enlarged retroperitoneal and pelvic lymph nodes, nonspecific but in the setting of disease recurrence these would be at least somewhat suspicious for nodal involvement. Consider attention on follow-up imaging. 3. No evidence of metastatic disease within the  chest. 4. Left-sided colonic diverticulosis without findings of acute diverticulitis. 5.  Aortic Atherosclerosis (ICD10-I70.0).   04/16/2022 - 08/09/2022 Chemotherapy   Patient is on Treatment Plan : UTERINE Lenvatinib (20) D1-21 + Pembrolizumab (200) D1 q21d     04/16/2022 -  Chemotherapy   Patient is on Treatment Plan : UTERINE Lenvatinib (20) D1-21 + Pembrolizumab (200) D1 q21d     07/09/2022 Imaging   Mild increase in mild left para-aortic and left iliac lymphadenopathy, consistent with metastatic lymphadenopathy.   Stable nodular thickening of vaginal cuff, consistent with vaginal recurrence.   Colonic diverticulosis, without radiographic evidence of diverticulitis.   Probable tiny calcified gallstone. No radiographic evidence of cholecystitis.   Aortic Atherosclerosis (ICD10-I70.0).     09/12/2022 Imaging   1. Mild decrease in thickening along the vaginal cuff. 2. LEFT iliac and LEFT periaortic retroperitoneal adenopathy is stable to slightly decreased. 3. No  evidence of new lymphadenopathy or progressive metastatic uterine carcinoma.      12/19/2022 Imaging   Persistent thickening of the walls of the vaginal cuff with some luminal fluid and air. The focal nodular component measured previously is less well-defined today relative to the adjacent wall thickening.   Persistent lymph node enlargement in the retroperitoneum and left pelvic sidewall. Some areas are similar and others are slightly increased. No new areas of lymph node enlargement.    Gallstones.       PHYSICAL EXAMINATION: ECOG PERFORMANCE STATUS: 0 - Asymptomatic  Vitals:   01/10/23 1145  BP: 136/66  Pulse: 73  Resp: 18  Temp: 98 F (36.7 C)  SpO2: 100%   Filed Weights   01/10/23 1145  Weight: 200 lb (90.7 kg)    GENERAL:alert, no distress and comfortable NEURO: alert & oriented x 3 with fluent speech, no focal motor/sensory deficits  LABORATORY DATA:  I have reviewed the data as listed    Component Value Date/Time   NA 140 01/10/2023 1118   NA 143 10/18/2020 1028   K 4.7 01/10/2023 1118   CL 106 01/10/2023 1118   CO2 26 01/10/2023 1118   GLUCOSE 107 (H) 01/10/2023 1118   BUN 23 01/10/2023 1118   BUN 13 10/18/2020 1028   CREATININE 1.16 (H) 01/10/2023 1118   CREATININE 0.79 02/11/2017 0943   CALCIUM 9.4 01/10/2023 1118   PROT 6.9 01/10/2023 1118   PROT 6.7 10/18/2020 1028   ALBUMIN 3.3 (L) 01/10/2023 1118   ALBUMIN 4.1 10/18/2020 1028   AST 14 (L) 01/10/2023 1118   ALT 11 01/10/2023 1118   ALKPHOS 85 01/10/2023 1118   BILITOT 0.4 01/10/2023 1118   GFRNONAA 53 (L) 01/10/2023 1118   GFRNONAA 83 02/11/2017 0943   GFRAA 70 10/18/2020 1028   GFRAA >89 02/11/2017 0943    No results found for: "SPEP", "UPEP"  Lab Results  Component Value Date   WBC 6.0 01/10/2023   NEUTROABS 3.6 01/10/2023   HGB 11.4 (L) 01/10/2023   HCT 33.8 (L) 01/10/2023   MCV 92.6 01/10/2023   PLT 316 01/10/2023      Chemistry      Component Value Date/Time   NA 140  01/10/2023 1118   NA 143 10/18/2020 1028   K 4.7 01/10/2023 1118   CL 106 01/10/2023 1118   CO2 26 01/10/2023 1118   BUN 23 01/10/2023 1118   BUN 13 10/18/2020 1028   CREATININE 1.16 (H) 01/10/2023 1118   CREATININE 0.79 02/11/2017 0943      Component Value Date/Time   CALCIUM 9.4  01/10/2023 1118   ALKPHOS 85 01/10/2023 1118   AST 14 (L) 01/10/2023 1118   ALT 11 01/10/2023 1118   BILITOT 0.4 01/10/2023 1118

## 2023-01-11 NOTE — Assessment & Plan Note (Signed)
She has stable changes on CT, overall good response The changes seen in the vaginal cuff is stable and that could account for her intermittent vaginal discharge I recommend we continue treatment for a few more months with plan to repeat CT imaging in April 

## 2023-01-11 NOTE — Assessment & Plan Note (Signed)
Previously, she had heavy proteinuria With better control of her blood pressure, her proteinuria and blood pressure readings are better We will continue current antihypertensives

## 2023-01-17 ENCOUNTER — Other Ambulatory Visit: Payer: Self-pay | Admitting: Hematology and Oncology

## 2023-01-17 ENCOUNTER — Other Ambulatory Visit: Payer: Self-pay

## 2023-01-17 ENCOUNTER — Other Ambulatory Visit: Payer: Self-pay | Admitting: Family Medicine

## 2023-01-17 DIAGNOSIS — E1159 Type 2 diabetes mellitus with other circulatory complications: Secondary | ICD-10-CM

## 2023-01-17 MED ORDER — GLIMEPIRIDE 4 MG PO TABS
4.0000 mg | ORAL_TABLET | Freq: Every day | ORAL | 0 refills | Status: DC
  Filled 2023-01-17: qty 30, 30d supply, fill #0

## 2023-01-17 MED ORDER — LISINOPRIL 10 MG PO TABS
30.0000 mg | ORAL_TABLET | Freq: Every day | ORAL | 0 refills | Status: DC
  Filled 2023-01-17: qty 90, 30d supply, fill #0

## 2023-01-17 NOTE — Telephone Encounter (Signed)
Courtesy refill. Patient must keep upcoming appointment for further refills. Requested Prescriptions  Pending Prescriptions Disp Refills   glimepiride (AMARYL) 4 MG tablet 30 tablet 6    Sig: Take 1 tablet (4 mg total) by mouth daily with breakfast.     Endocrinology:  Diabetes - Sulfonylureas Failed - 01/17/2023  2:25 PM      Failed - HBA1C is between 0 and 7.9 and within 180 days    HbA1c, POC (controlled diabetic range)  Date Value Ref Range Status  03/18/2022 7.3 (A) 0.0 - 7.0 % Final         Failed - Cr in normal range and within 360 days    Creatinine  Date Value Ref Range Status  01/10/2023 1.16 (H) 0.44 - 1.00 mg/dL Final   Creat  Date Value Ref Range Status  02/11/2017 0.79 0.50 - 1.05 mg/dL Final    Comment:      For patients > or = 65 years of age: The upper reference limit for Creatinine is approximately 13% higher for people identified as African-American.      Creatinine, Urine  Date Value Ref Range Status  06/19/2019 268.44 mg/dL Final    Comment:    Performed at [facility] [address], [place], [place] [address]         Failed - Valid encounter within last 6 months    Recent Outpatient Visits           10 months ago Type 2 diabetes mellitus with other circulatory complication, without long-term current use of insulin ([place])   [place], [place], MD   1 year ago Type 2 diabetes mellitus with other circulatory complication, without long-term current use of insulin ([place])   [place] [name], MD   2 years ago Post-menopausal bleeding   [place], [name], MD   2 years ago Type 2 diabetes mellitus with other circulatory complication, without long-term current use of insulin ([place])   [place], [name], MD   2 years ago Type 2 diabetes mellitus with other circulatory  complication, without long-term current use of insulin ([place])   [place], [place], MD       Future Appointments             In 1 month [name], MD [place]

## 2023-01-20 ENCOUNTER — Other Ambulatory Visit (HOSPITAL_COMMUNITY): Payer: Self-pay

## 2023-01-22 ENCOUNTER — Other Ambulatory Visit (HOSPITAL_COMMUNITY): Payer: Self-pay

## 2023-01-29 ENCOUNTER — Other Ambulatory Visit: Payer: Self-pay

## 2023-01-31 ENCOUNTER — Inpatient Hospital Stay: Payer: Medicaid Other | Attending: Gynecologic Oncology | Admitting: Hematology and Oncology

## 2023-01-31 ENCOUNTER — Telehealth: Payer: Self-pay

## 2023-01-31 ENCOUNTER — Encounter: Payer: Self-pay | Admitting: Hematology and Oncology

## 2023-01-31 ENCOUNTER — Inpatient Hospital Stay: Payer: Medicaid Other

## 2023-01-31 VITALS — BP 149/84

## 2023-01-31 VITALS — BP 174/80 | HR 78 | Temp 97.9°F | Resp 18 | Ht 62.0 in | Wt 200.8 lb

## 2023-01-31 DIAGNOSIS — Z5112 Encounter for antineoplastic immunotherapy: Secondary | ICD-10-CM | POA: Diagnosis present

## 2023-01-31 DIAGNOSIS — C541 Malignant neoplasm of endometrium: Secondary | ICD-10-CM

## 2023-01-31 DIAGNOSIS — D638 Anemia in other chronic diseases classified elsewhere: Secondary | ICD-10-CM | POA: Diagnosis not present

## 2023-01-31 DIAGNOSIS — Z79899 Other long term (current) drug therapy: Secondary | ICD-10-CM | POA: Diagnosis not present

## 2023-01-31 DIAGNOSIS — I10 Essential (primary) hypertension: Secondary | ICD-10-CM | POA: Insufficient documentation

## 2023-01-31 DIAGNOSIS — T451X5A Adverse effect of antineoplastic and immunosuppressive drugs, initial encounter: Secondary | ICD-10-CM | POA: Diagnosis not present

## 2023-01-31 DIAGNOSIS — Z7984 Long term (current) use of oral hypoglycemic drugs: Secondary | ICD-10-CM | POA: Insufficient documentation

## 2023-01-31 DIAGNOSIS — D6481 Anemia due to antineoplastic chemotherapy: Secondary | ICD-10-CM | POA: Diagnosis not present

## 2023-01-31 DIAGNOSIS — Z7982 Long term (current) use of aspirin: Secondary | ICD-10-CM | POA: Insufficient documentation

## 2023-01-31 LAB — CMP (CANCER CENTER ONLY)
ALT: 9 U/L (ref 0–44)
AST: 10 U/L — ABNORMAL LOW (ref 15–41)
Albumin: 3.6 g/dL (ref 3.5–5.0)
Alkaline Phosphatase: 89 U/L (ref 38–126)
Anion gap: 8 (ref 5–15)
BUN: 23 mg/dL (ref 8–23)
CO2: 25 mmol/L (ref 22–32)
Calcium: 9.1 mg/dL (ref 8.9–10.3)
Chloride: 108 mmol/L (ref 98–111)
Creatinine: 0.99 mg/dL (ref 0.44–1.00)
GFR, Estimated: >60 mL/min (ref >60)
Glucose, Bld: 116 mg/dL — ABNORMAL HIGH (ref 70–99)
Potassium: 4.3 mmol/L (ref 3.5–5.1)
Sodium: 141 mmol/L (ref 135–145)
Total Bilirubin: 0.3 mg/dL (ref 0.3–1.2)
Total Protein: 6.9 g/dL (ref 6.5–8.1)

## 2023-01-31 LAB — CBC WITH DIFFERENTIAL (CANCER CENTER ONLY)
Abs Immature Granulocytes: 0.01 10*3/uL (ref 0.00–0.07)
Basophils Absolute: 0 10*3/uL (ref 0.0–0.1)
Basophils Relative: 1 %
Eosinophils Absolute: 0.1 10*3/uL (ref 0.0–0.5)
Eosinophils Relative: 1 %
HCT: 35.1 % — ABNORMAL LOW (ref 36.0–46.0)
Hemoglobin: 11.8 g/dL — ABNORMAL LOW (ref 12.0–15.0)
Immature Granulocytes: 0 %
Lymphocytes Relative: 30 %
Lymphs Abs: 1.8 10*3/uL (ref 0.7–4.0)
MCH: 30.9 pg (ref 26.0–34.0)
MCHC: 33.6 g/dL (ref 30.0–36.0)
MCV: 91.9 fL (ref 80.0–100.0)
Monocytes Absolute: 0.5 10*3/uL (ref 0.1–1.0)
Monocytes Relative: 8 %
Neutro Abs: 3.7 10*3/uL (ref 1.7–7.7)
Neutrophils Relative %: 60 %
Platelet Count: 298 10*3/uL (ref 150–400)
RBC: 3.82 MIL/uL — ABNORMAL LOW (ref 3.87–5.11)
RDW: 14.2 % (ref 11.5–15.5)
WBC Count: 6.1 10*3/uL (ref 4.0–10.5)
nRBC: 0 % (ref 0.0–0.2)

## 2023-01-31 LAB — TSH: TSH: 2.337 u[IU]/mL (ref 0.350–4.500)

## 2023-01-31 LAB — TOTAL PROTEIN, URINE DIPSTICK: Protein, ur: 300 mg/dL — AB

## 2023-01-31 MED ORDER — HEPARIN SOD (PORK) LOCK FLUSH 100 UNIT/ML IV SOLN
500.0000 [IU] | Freq: Once | INTRAVENOUS | Status: AC | PRN
  Administered 2023-01-31: 500 [IU]

## 2023-01-31 MED ORDER — SODIUM CHLORIDE 0.9% FLUSH
10.0000 mL | Freq: Once | INTRAVENOUS | Status: AC
  Administered 2023-01-31: 10 mL

## 2023-01-31 MED ORDER — SODIUM CHLORIDE 0.9 % IV SOLN: Freq: Once | INTRAVENOUS | Status: AC

## 2023-01-31 MED ORDER — SODIUM CHLORIDE 0.9% FLUSH
10.0000 mL | INTRAVENOUS | Status: DC | PRN
  Administered 2023-01-31: 10 mL

## 2023-01-31 MED ORDER — SODIUM CHLORIDE 0.9 % IV SOLN
200.0000 mg | Freq: Once | INTRAVENOUS | Status: AC
  Administered 2023-01-31: 200 mg via INTRAVENOUS
  Filled 2023-01-31: qty 8

## 2023-01-31 NOTE — Assessment & Plan Note (Signed)
She has stable changes on CT, overall good response The changes seen in the vaginal cuff is stable and that could account for her intermittent vaginal discharge I recommend we continue treatment for a few more months with plan to repeat CT imaging in April

## 2023-01-31 NOTE — Telephone Encounter (Signed)
Called daughter per Dr. Alvy Bimler, her urine protein is high. Instructed [name] per Dr. Lurlean Leyden to stop her Mom's Lenvima until her BP is better in her next visit. Ask her to bring bp readings to next appt. [name] verbalized understanding.

## 2023-01-31 NOTE — Assessment & Plan Note (Signed)
She has multifactorial anemia, component of anemia chronic illness and from prior treatment Observe closely

## 2023-01-31 NOTE — Patient Instructions (Signed)
[facility]  Discharge Instructions: Thank you for choosing [facility] to provide your oncology and hematology care.   If you have a lab appointment with the Cancer Center, please go directly to the Cancer Center and check in at the registration area.   Wear comfortable clothing and clothing appropriate for easy access to any Portacath or PICC line.   We strive to give you quality time with your provider. You may need to reschedule your appointment if you arrive late (15 or more minutes).  Arriving late affects you and other patients whose appointments are after yours.  Also, if you miss three or more appointments without notifying the office, you may be dismissed from the clinic at the provider's discretion.      For prescription refill requests, have your pharmacy contact our office and allow 72 hours for refills to be completed.    Today you received the following chemotherapy and/or immunotherapy agents Keytruda      To help prevent nausea and vomiting after your treatment, we encourage you to take your nausea medication as directed.  BELOW ARE SYMPTOMS THAT SHOULD BE REPORTED IMMEDIATELY: *FEVER GREATER THAN 100.4 F (38 C) OR HIGHER *CHILLS OR SWEATING *NAUSEA AND VOMITING THAT IS NOT CONTROLLED WITH YOUR NAUSEA MEDICATION *UNUSUAL SHORTNESS OF BREATH *UNUSUAL BRUISING OR BLEEDING *URINARY PROBLEMS (pain or burning when urinating, or frequent urination) *BOWEL PROBLEMS (unusual diarrhea, constipation, pain near the anus) TENDERNESS IN MOUTH AND THROAT WITH OR WITHOUT PRESENCE OF ULCERS (sore throat, sores in mouth, or a toothache) UNUSUAL RASH, SWELLING OR PAIN  UNUSUAL VAGINAL DISCHARGE OR ITCHING   Items with * indicate a potential emergency and should be followed up as soon as possible or go to the Emergency Department if any problems should occur.  Please show the CHEMOTHERAPY ALERT CARD or IMMUNOTHERAPY ALERT CARD at  check-in to the Emergency Department and triage nurse.  Should you have questions after your visit or need to cancel or reschedule your appointment, please contact [facility]  Dept: [phone]  and follow the prompts.  Office hours are 8:00 a.m. to 4:30 p.m. Monday - Friday. Please note that voicemails left after 4:00 p.m. may not be returned until the following business day.  We are closed weekends and major holidays. You have access to a nurse at all times for urgent questions. Please call the main number to the clinic Dept: [phone] and follow the prompts.   For any non-urgent questions, you may also contact your provider using MyChart. We now offer e-Visits for anyone 18 and older to request care online for non-urgent symptoms. For details visit [email or web address].   Also download the MyChart app! Go to the app store, search "MyChart", open the app, select [facility], and log in with your MyChart username and password.   

## 2023-01-31 NOTE — Progress Notes (Signed)
[facility] OFFICE PROGRESS NOTE  Patient Care Team: [name], MD as PCP - General (Family Medicine) Croitoru, Dani Gobble, MD as PCP - Cardiology (Cardiology)  ASSESSMENT & PLAN:  Papillary serous adenocarcinoma of endometrium [facility]) She has stable changes on CT, overall good response The changes seen in the vaginal cuff is stable and that could account for her intermittent vaginal discharge I recommend we continue treatment for a few more months with plan to repeat CT imaging in April  Anemia due to antineoplastic chemotherapy She has multifactorial anemia, component of anemia chronic illness and from prior treatment Observe closely  Essential hypertension She did not bring her blood pressure reading from home but according to the patient, they were much better Today, her elevated blood pressure could be due to whitecoat hypertension We will continue current antihypertensives  Orders Placed This Encounter  Procedures   CBC with Differential ([facility] Only)    Standing Status:   Future    Standing Expiration Date:   02/21/2024   CMP ([facility] only)    Standing Status:   Future    Standing Expiration Date:   02/21/2024   CBC with Differential ([facility] Only)    Standing Status:   Future    Standing Expiration Date:   03/21/2024   CMP ([facility] only)    Standing Status:   Future    Standing Expiration Date:   03/21/2024    All questions were answered. The patient knows to call the clinic with any problems, questions or concerns. The total time spent in the appointment was 20 minutes encounter with patients including review of chart and various tests results, discussions about plan of care and coordination of care plan   Heath Lark, MD 01/31/2023 11:40 AM  INTERVAL HISTORY: Please see below for problem oriented charting. she returns for treatment follow-up seen prior to treatment She forgot to bring her blood pressure from home According to the  patient, her blood pressure readings are good She has minimal vaginal bleeding/discharge  REVIEW OF SYSTEMS:   Constitutional: Denies fevers, chills or abnormal weight loss Eyes: Denies blurriness of vision Ears, nose, mouth, throat, and face: Denies mucositis or sore throat Respiratory: Denies cough, dyspnea or wheezes Cardiovascular: Denies palpitation, chest discomfort or lower extremity swelling Gastrointestinal:  Denies nausea, heartburn or change in bowel habits Skin: Denies abnormal skin rashes Lymphatics: Denies new lymphadenopathy or easy bruising Neurological:Denies numbness, tingling or new weaknesses Behavioral/Psych: Mood is stable, no new changes  All other systems were reviewed with the patient and are negative.  I have reviewed the past medical history, past surgical history, social history and family history with the patient and they are unchanged from previous note.  ALLERGIES:  has No Known Allergies.  MEDICATIONS:  Current Outpatient Medications  Medication Sig Dispense Refill   amLODipine (NORVASC) 10 MG tablet Take 1 tablet (10 mg total) by mouth daily. 90 tablet 0   aspirin 81 MG EC tablet Take 1 tablet (81 mg total) by mouth daily. 30 tablet 12   atorvastatin (LIPITOR) 80 MG tablet Take 1 tablet (80 mg total) by mouth at bedtime. 90 tablet 0   Dulaglutide (TRULICITY) 1.5 0000000 SOPN Inject 1.5 mg into the skin once a week. 2 mL 6   glimepiride (AMARYL) 4 MG tablet Take 1 tablet (4 mg total) by mouth daily with breakfast. 30 tablet 0   isosorbide mononitrate (IMDUR) 30 MG 24 hr tablet Take 2 tablets (60 mg total) by  mouth daily. 180 tablet 0   lenvatinib 10 mg daily dose (LENVIMA, 10 MG DAILY DOSE,) capsule Take 1 capsule (10 mg total) by mouth daily. 30 capsule 11   lidocaine-prilocaine (EMLA) cream Apply 1 Application topically as needed. 30 g 0   lisinopril (ZESTRIL) 10 MG tablet Take 3 tablets (30 mg total) by mouth daily. 90 tablet 0   metFORMIN  (GLUCOPHAGE) 500 MG tablet Take 2 tablets (1,000 mg total) by mouth 2 (two) times daily with a meal. 360 tablet 0   metoprolol tartrate (LOPRESSOR) 100 MG tablet Take 1 tablet (100 mg total) by mouth 2 (two) times daily. 180 tablet 0   nitroGLYCERIN (NITROSTAT) 0.4 MG SL tablet Place 1 tablet (0.4 mg total) under the tongue every 5 (five) minutes as needed for chest pain. 25 tablet 2   No current facility-administered medications for this visit.    SUMMARY OF ONCOLOGIC HISTORY: Oncology History Overview Note  Pap 12/14/20: adenocarcinoma, HPV negative  HER-2 positive by FISH Her-2 equivocal by [facility]  MSI stable    Papillary serous adenocarcinoma of endometrium ([place])   Initial Diagnosis   Endometrial carcinoma ([place])   01/12/2021 Initial Biopsy   A. ENDOCERVIX, CURETTAGE:  - Adenocarcinoma.  B. ENDOMETRIUM, BIOPSY:  - Adenocarcinoma.  COMMENT:  The differential diagnosis includes high grade serous carcinoma.  Both  specimens have a similar morphology; high grade serous carcinoma more  commonly arises in the endometrium.  Results reported to Dr. Hale Bogus  on 01/15/2021.  Dr. Saralyn Pilar reviewed the case.    01/16/2021 Tumor Marker   Patient's tumor was tested for the following markers: CA-125 Results of the tumor marker test revealed normal value, 10.7   01/25/2021 Imaging   CT C/A/P: 1. Small volume fluid in the endometrial canal with 7 mm hypoattenuating lesion in the myometrium of the anterior fundus. 2. No definite evidence for metastatic disease in the chest, abdomen, or pelvis. Small lymph nodes are noted along both pelvic sidewalls. Attention on follow-up recommended. 3. 5 mm ground-glass opacity peripheral left upper lobe. This is likely related to infection/inflammation and potentially scar. Attention on surveillance imaging recommended. 4. 5.8 cm lesion posterior lower uterine segment likely a fibroid. Ultrasound exam from 01/28/2010 demonstrated a 5.2 cm lesion in  the left aspect of the posterior lower uterine body. 5. Aortic Atherosclerosis (ICD10-I70.0).   01/29/2021 Surgery   TRH/BSO, SLN biopsy left, selective pelvic LND right, omentectomy  Findings: On EUA, 10-12cm enlarged mobile uterus. On intra-abdomina entry, minimal adhesions between the liver and anterior abdomen on the right. Some changes c/w fatty liver on the left. Omentum, stomach, small and large bowel all grossly normal. Bilateral ovaries normal appearing. Uterus with 5-6cm posterior fibroid, otherwise normal appearing. No mapping to right pelvis. On left, mapping to the level of the superior vessel artery, enlarged lymph node noted (likely sentinel) within the upper aspect of the obturator space. In bilateral pelvic basins, multiple enlarged lymph nodes. On the right, enlarged lymph nodes extended superiorly along the common iliac vessels. At the end of surgery, no obvious abdominal or pelvic evidence of disease.   01/29/2021 Pathology Results   A. SENTINEL LYMPH NODE, LEFT INTERNAL ILIAC, BIOPSY:  - Metastatic carcinoma in (1) of (1) lymph node.   B. SENTINEL LYMPH NODE, LEFT OBTURATOR, BIOPSY:  - Metastatic carcinoma in (1) of (1) lymph node.   C. LYMPH NODES, LEFT PELVIC, DISSECTION:  - Metastatic carcinoma in (1) of (3) lymph nodes.   D. LYMPH NODE, RIGHT  EXTERNAL AND COMMON ILIAC, BIOPSY:  - Metastatic carcinoma in (1) of (1) lymph node.   E. UTERUS, CERVIX AND BILATERAL FALLOPIAN TUBES AND OVARIES, TOTAL  HYSTERECTOMY AND BILATERAL SALPINGO-OOPHORECTOMY:  - High grade serous carcinoma of endometrium, with invasion more than  half of the myometrium.  - Tumor invades the stromal connective tissue of the cervix.  - No involvement of uterine serosa or adnexa.  - Lymphovascular invasion is identified.  - See oncology table.   F. OMENTUM, OMENTECTOMY:  - Omentum, negative for carcinoma.   G. LYMPH NODES, RIGHT PELVIC, DISSECTION:  - Two lymph nodes, negative for carcinoma  (0/2).   ONCOLOGY TABLE:   UTERUS, CARCINOMA OR CARCINOSARCOMA: Resection   Procedure: Total hysterectomy and bilateral salpingo-oophorectomy,  Omentectomy, Lymph node sampling  Histologic Type: Serous carcinoma  Histologic Grade: High grade  Myometrial Invasion: > 50%  Uterine Serosa Involvement: Not identified  Cervical Stroma Involvement: Present  Other Tissue/Organ Involvement: Not identified  Peritoneal/Ascitic Fluid: Negative for carcinoma  Lymphovascular Invasion: Present  Regional Lymph Nodes:       Pelvic Lymph Nodes Examined:            2 Sentinel            6 Non-Sentinel            8 Total       Pelvic Lymph Nodes with Metastasis: 4            Macrometastasis: 3            Micrometastasis: 1            Isolated Tumor Cells: 0            Laterality of Lymph Nodes with Tumor: Right (non-sentinel),  Left (sentinel and non-sentinel)            Extracapsular Extension: Present       Para-Aortic Lymph Nodes Examined: Not applicable        Para-Aortic Lymph Nodes with Metastasis: Not applicable  Distant Metastasis:       Distant Site(s) Involved: Omentum: Not involved  Pathologic Stage Classification (pTNM, AJCC 8th Edition): pT2, pN1a  Ancillary Studies: HER2 will be ordered  Additional Findings: Leiomyomata.  Adenomyosis.  Representative Tumor Block: B1  Comment: Dr. Saralyn Pilar reviewed select slides.  (v4.2.0.1)    01/29/2021 Cancer Staging   Staging form: Corpus Uteri - Carcinoma and Carcinosarcoma, AJCC 8th Edition - Clinical stage from 01/29/2021: FIGO Stage IIIC1 (cT1b, cN1a, cM0) - Signed by Lafonda Mosses, MD on 02/02/2021 Histopathologic type: Mixed cell adenocarcinoma Stage prefix: Initial diagnosis Method of lymph node assessment: Other Histologic grade (G): G3 Histologic grading system: 3 grade system Lymph-vascular invasion (LVI): LVI present/identified, NOS Peritoneal cytology results: Negative Pelvic nodal status: Positive Number of pelvic nodes  positive from dissection: 4 Number of pelvic nodes examined during dissection: 8 Para-aortic status: Not assessed Lymph node metastasis: Present Omentectomy performed: Yes Morcellation performed: No   02/19/2021 Echocardiogram    1. Left ventricular ejection fraction, by estimation, is 55 to 60%. The left ventricle has normal function. The left ventricle demonstrates regional wall motion abnormalities (see scoring diagram/findings for description). There is mild left ventricular  hypertrophy. Left ventricular diastolic parameters are consistent with Grade I diastolic dysfunction (impaired relaxation). There is moderate hypokinesis of the left ventricular, basal septal wall and inferior wall. The average left ventricular global longitudinal strain is -17.9 %. The global longitudinal strain is abnormal.  2. Right ventricular systolic function is  normal. The right ventricular size is normal. There is normal pulmonary artery systolic pressure. The estimated right ventricular systolic pressure is 0000000 mmHg.  3. The mitral valve is abnormal. Trivial mitral valve regurgitation.  4. The aortic valve is tricuspid. Aortic valve regurgitation is not visualized.  5. The inferior vena cava is normal in size with greater than 50% respiratory variability, suggesting right atrial pressure of 3 mmHg.   02/20/2021 Procedure   Successful placement of a right IJ approach Power Port with ultrasound and fluoroscopic guidance. The catheter is ready for use.   02/28/2021 - 06/14/2021 Chemotherapy    Patient is on Treatment Plan: UTERINE CARBOPLATIN AUC 6 / PACLITAXEL Q21D       04/20/2021 Imaging   Status post hysterectomy and suspected bilateral salpingo-oophorectomy.   2.2 cm fluid density lesion along the left pelvic sidewall likely reflects a postoperative seroma.   No findings suspicious for recurrent or metastatic disease.   07/16/2021 - 08/16/2021 Radiation Therapy   Radiation Treatment Dates: 07/16/2021 through  08/16/2021 Site Technique Total Dose (Gy) Dose per Fx (Gy) Completed Fx Beam Energies  Vagina: Pelvis HDR-brachy 30/30 6 5/5 Ir-192        10/01/2021 Imaging   Increasing size of LEFT pelvic lymph nodes, largest approximately 11 mm along the external iliac chain. Given findings and history could consider PET for further evaluation as warranted.   Cystic area along the LEFT pelvic sidewall that was seen previously has improved.   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   Aortic Atherosclerosis (ICD10-I70.0).   12/21/2021 Imaging   1. Enlarging soft tissue masses involving the vaginal cuff. 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 Speculum examination and re-biopsy may be confirmatory. PET-CT may be helpful. 2. New small sub 5 mm perirectal and sigmoid mesocolon nodes. 3. Slight progression of pelvic adenopathy as detailed above. 4. No findings suspicious for abdominal metastatic disease or osseous metastatic disease. 5. Stable age advanced atherosclerotic calcifications involving the aorta and branch vessels and coronary arteries   12/25/2021 Echocardiogram    1. Global longitudinal strain is -16.1% Mild basal inferior hypoinesis. Compared to echo from 02/2021, no change in overall LVEF /regional wall motion. Global longitudinal strain is mildly less negative (-17.9% to -16.1%. Left ventricular ejection fraction, by estimation, is 65 to 70%. The left ventricle has normal function. The left ventricle has no regional wall motion abnormalities. There is mild left ventricular hypertrophy. Left ventricular diastolic parameters are indeterminate.  2. Right ventricular systolic function is normal. The right ventricular size is normal. There is normal pulmonary artery systolic pressure.  3. Trivial mitral valve regurgitation.  4. The aortic valve is normal in structure. Aortic  valve regurgitation is not visualized.  5. The inferior vena cava is normal in size with greater than 50% respiratory variability, suggesting right atrial pressure of 3 mmHg.     01/01/2022 - 01/22/2022 Chemotherapy   Patient is on Treatment Plan : UTERINE SEROUS CARCINOMA Carboplatin + Paclitaxel + Trastuzumab q21d x 6 Cycles / Trastuzumab q21d     03/29/2022 Pathology Results   A.   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   COMMENT:   The carcinoma has diffuse strong Pax8 immunoreactivity (Mullerian marker) and abnormal overexpression of nuclear p53 and relatively diffuse strong p16 staining.  Estrogen receptor (ER) is weakly reactive  and there is focal strong Napsin A immunoreactivity.  The immunophenotype is most characteristic of serous carcinoma.   The history of prior treatment for endometrial serous carcinoma is noted.   04/12/2022 Imaging   1. Surgical change of prior hysterectomy with increased nodular thickening at the vaginal cuff, suspicious for recurrent disease. Consider further evaluation with PET-CT versus direct tissue sampling. 2. Unchanged prominent/mildly enlarged retroperitoneal and pelvic lymph nodes, nonspecific but in the setting of disease recurrence these would be at least somewhat suspicious for nodal involvement. Consider attention on follow-up imaging. 3. No evidence of metastatic disease within the chest. 4. Left-sided colonic diverticulosis without findings of acute diverticulitis. 5.  Aortic Atherosclerosis (ICD10-I70.0).   04/16/2022 - 08/09/2022 Chemotherapy   Patient is on Treatment Plan : UTERINE Lenvatinib (20) D1-21 + Pembrolizumab (200) D1 q21d     04/16/2022 -  Chemotherapy   Patient is on Treatment Plan : UTERINE Lenvatinib (20) D1-21 + Pembrolizumab (200) D1 q21d     07/09/2022 Imaging   Mild increase in mild left  para-aortic and left iliac lymphadenopathy, consistent with metastatic lymphadenopathy.   Stable nodular thickening of vaginal cuff, consistent with vaginal recurrence.   Colonic diverticulosis, without radiographic evidence of diverticulitis.   Probable tiny calcified gallstone. No radiographic evidence of cholecystitis.   Aortic Atherosclerosis (ICD10-I70.0).     09/12/2022 Imaging   1. Mild decrease in thickening along the vaginal cuff. 2. LEFT iliac and LEFT periaortic retroperitoneal adenopathy is stable to slightly decreased. 3. No evidence of new lymphadenopathy or progressive metastatic uterine carcinoma.      12/19/2022 Imaging   Persistent thickening of the walls of the vaginal cuff with some luminal fluid and air. The focal nodular component measured previously is less well-defined today relative to the adjacent wall thickening.   Persistent lymph node enlargement in the retroperitoneum and left pelvic sidewall. Some areas are similar and others are slightly increased. No new areas of lymph node enlargement.    Gallstones.       PHYSICAL EXAMINATION: ECOG PERFORMANCE STATUS: 0 - Asymptomatic  Vitals:   01/31/23 1112  BP: (!) 174/80  Pulse: 78  Resp: 18  Temp: 97.9 F (36.6 C)  SpO2: 98%   Filed Weights   01/31/23 1112  Weight: 200 lb 12.8 oz (91.1 kg)    GENERAL:alert, no distress and comfortable  NEURO: alert & oriented x 3 with fluent speech, no focal motor/sensory deficits  LABORATORY DATA:  I have reviewed the data as listed    Component Value Date/Time   NA 141 01/31/2023 1043   NA 143 10/18/2020 1028   K 4.3 01/31/2023 1043   CL 108 01/31/2023 1043   CO2 25 01/31/2023 1043   GLUCOSE 116 (H) 01/31/2023 1043   BUN 23 01/31/2023 1043   BUN 13 10/18/2020 1028   CREATININE 0.99 01/31/2023 1043   CREATININE 0.79 02/11/2017 0943   CALCIUM 9.1 01/31/2023 1043   PROT 6.9 01/31/2023 1043   PROT 6.7 10/18/2020 1028   ALBUMIN 3.6 01/31/2023 1043    ALBUMIN 4.1 10/18/2020 1028   AST 10 (L) 01/31/2023 1043   ALT 9 01/31/2023 1043   ALKPHOS 89 01/31/2023 1043   BILITOT 0.3 01/31/2023 1043   GFRNONAA >60 01/31/2023 1043   GFRNONAA 83 02/11/2017 0943   GFRAA 70 10/18/2020 1028   GFRAA >89 02/11/2017 0943    No results found for: "SPEP", "UPEP"  Lab Results  Component Value Date  WBC 6.1 01/31/2023   NEUTROABS 3.7 01/31/2023   HGB 11.8 (L) 01/31/2023   HCT 35.1 (L) 01/31/2023   MCV 91.9 01/31/2023   PLT 298 01/31/2023      Chemistry      Component Value Date/Time   NA 141 01/31/2023 1043   NA 143 10/18/2020 1028   K 4.3 01/31/2023 1043   CL 108 01/31/2023 1043   CO2 25 01/31/2023 1043   BUN 23 01/31/2023 1043   BUN 13 10/18/2020 1028   CREATININE 0.99 01/31/2023 1043   CREATININE 0.79 02/11/2017 0943      Component Value Date/Time   CALCIUM 9.1 01/31/2023 1043   ALKPHOS 89 01/31/2023 1043   AST 10 (L) 01/31/2023 1043   ALT 9 01/31/2023 1043   BILITOT 0.3 01/31/2023 1043

## 2023-01-31 NOTE — Assessment & Plan Note (Signed)
She did not bring her blood pressure reading from home but according to the patient, they were much better Today, her elevated blood pressure could be due to whitecoat hypertension We will continue current antihypertensives

## 2023-02-02 ENCOUNTER — Other Ambulatory Visit: Payer: Self-pay

## 2023-02-06 ENCOUNTER — Other Ambulatory Visit: Payer: Self-pay

## 2023-02-18 ENCOUNTER — Other Ambulatory Visit (HOSPITAL_COMMUNITY): Payer: Self-pay

## 2023-02-20 ENCOUNTER — Encounter: Payer: Self-pay | Admitting: Hematology and Oncology

## 2023-02-20 ENCOUNTER — Inpatient Hospital Stay: Payer: Medicaid Other | Attending: Gynecologic Oncology

## 2023-02-20 ENCOUNTER — Other Ambulatory Visit: Payer: Self-pay

## 2023-02-20 ENCOUNTER — Inpatient Hospital Stay (HOSPITAL_BASED_OUTPATIENT_CLINIC_OR_DEPARTMENT_OTHER): Payer: Medicaid Other | Admitting: Hematology and Oncology

## 2023-02-20 ENCOUNTER — Inpatient Hospital Stay: Payer: Medicaid Other

## 2023-02-20 VITALS — BP 141/78 | HR 74 | Temp 98.4°F | Resp 16

## 2023-02-20 VITALS — BP 132/72 | HR 75 | Temp 98.3°F | Resp 16 | Wt 209.2 lb

## 2023-02-20 DIAGNOSIS — T451X5A Adverse effect of antineoplastic and immunosuppressive drugs, initial encounter: Secondary | ICD-10-CM | POA: Insufficient documentation

## 2023-02-20 DIAGNOSIS — I700 Atherosclerosis of aorta: Secondary | ICD-10-CM | POA: Diagnosis not present

## 2023-02-20 DIAGNOSIS — C541 Malignant neoplasm of endometrium: Secondary | ICD-10-CM | POA: Insufficient documentation

## 2023-02-20 DIAGNOSIS — D6481 Anemia due to antineoplastic chemotherapy: Secondary | ICD-10-CM | POA: Diagnosis not present

## 2023-02-20 DIAGNOSIS — Z79899 Other long term (current) drug therapy: Secondary | ICD-10-CM | POA: Insufficient documentation

## 2023-02-20 DIAGNOSIS — Z7982 Long term (current) use of aspirin: Secondary | ICD-10-CM | POA: Insufficient documentation

## 2023-02-20 DIAGNOSIS — Z5112 Encounter for antineoplastic immunotherapy: Secondary | ICD-10-CM | POA: Insufficient documentation

## 2023-02-20 DIAGNOSIS — Z7985 Long-term (current) use of injectable non-insulin antidiabetic drugs: Secondary | ICD-10-CM | POA: Diagnosis not present

## 2023-02-20 DIAGNOSIS — D649 Anemia, unspecified: Secondary | ICD-10-CM | POA: Insufficient documentation

## 2023-02-20 DIAGNOSIS — I10 Essential (primary) hypertension: Secondary | ICD-10-CM | POA: Diagnosis not present

## 2023-02-20 DIAGNOSIS — Z7984 Long term (current) use of oral hypoglycemic drugs: Secondary | ICD-10-CM | POA: Diagnosis not present

## 2023-02-20 LAB — CBC WITH DIFFERENTIAL (CANCER CENTER ONLY)
Abs Immature Granulocytes: 0.03 10*3/uL (ref 0.00–0.07)
Basophils Absolute: 0 10*3/uL (ref 0.0–0.1)
Basophils Relative: 0 %
Eosinophils Absolute: 0.1 10*3/uL (ref 0.0–0.5)
Eosinophils Relative: 1 %
HCT: 32.3 % — ABNORMAL LOW (ref 36.0–46.0)
Hemoglobin: 10.7 g/dL — ABNORMAL LOW (ref 12.0–15.0)
Immature Granulocytes: 1 %
Lymphocytes Relative: 22 %
Lymphs Abs: 1.4 10*3/uL (ref 0.7–4.0)
MCH: 31.1 pg (ref 26.0–34.0)
MCHC: 33.1 g/dL (ref 30.0–36.0)
MCV: 93.9 fL (ref 80.0–100.0)
Monocytes Absolute: 0.6 10*3/uL (ref 0.1–1.0)
Monocytes Relative: 10 %
Neutro Abs: 4.2 10*3/uL (ref 1.7–7.7)
Neutrophils Relative %: 66 %
Platelet Count: 264 10*3/uL (ref 150–400)
RBC: 3.44 MIL/uL — ABNORMAL LOW (ref 3.87–5.11)
RDW: 14.7 % (ref 11.5–15.5)
WBC Count: 6.2 10*3/uL (ref 4.0–10.5)
nRBC: 0 % (ref 0.0–0.2)

## 2023-02-20 LAB — CMP (CANCER CENTER ONLY)
ALT: 9 U/L (ref 0–44)
AST: 10 U/L — ABNORMAL LOW (ref 15–41)
Albumin: 3.5 g/dL (ref 3.5–5.0)
Alkaline Phosphatase: 76 U/L (ref 38–126)
Anion gap: 8 (ref 5–15)
BUN: 19 mg/dL (ref 8–23)
CO2: 24 mmol/L (ref 22–32)
Calcium: 9.3 mg/dL (ref 8.9–10.3)
Chloride: 109 mmol/L (ref 98–111)
Creatinine: 1.01 mg/dL — ABNORMAL HIGH (ref 0.44–1.00)
GFR, Estimated: >60 mL/min (ref >60)
Glucose, Bld: 129 mg/dL — ABNORMAL HIGH (ref 70–99)
Potassium: 4.1 mmol/L (ref 3.5–5.1)
Sodium: 141 mmol/L (ref 135–145)
Total Bilirubin: 0.3 mg/dL (ref 0.3–1.2)
Total Protein: 6.9 g/dL (ref 6.5–8.1)

## 2023-02-20 LAB — TSH: TSH: 1.956 u[IU]/mL (ref 0.350–4.500)

## 2023-02-20 LAB — TOTAL PROTEIN, URINE DIPSTICK: Protein, ur: 100 mg/dL — AB

## 2023-02-20 MED ORDER — SODIUM CHLORIDE 0.9 % IV SOLN
200.0000 mg | Freq: Once | INTRAVENOUS | Status: AC
  Administered 2023-02-20: 200 mg via INTRAVENOUS
  Filled 2023-02-20: qty 8

## 2023-02-20 MED ORDER — HEPARIN SOD (PORK) LOCK FLUSH 100 UNIT/ML IV SOLN
500.0000 [IU] | Freq: Once | INTRAVENOUS | Status: AC | PRN
  Administered 2023-02-20: 500 [IU]

## 2023-02-20 MED ORDER — SODIUM CHLORIDE 0.9% FLUSH
10.0000 mL | INTRAVENOUS | Status: DC | PRN
  Administered 2023-02-20: 10 mL

## 2023-02-20 MED ORDER — SODIUM CHLORIDE 0.9 % IV SOLN: Freq: Once | INTRAVENOUS | Status: AC

## 2023-02-20 MED ORDER — SODIUM CHLORIDE 0.9% FLUSH
10.0000 mL | Freq: Once | INTRAVENOUS | Status: AC
  Administered 2023-02-20: 10 mL

## 2023-02-20 NOTE — Patient Instructions (Signed)
[facility]  Discharge Instructions: Thank you for choosing [facility] to provide your oncology and hematology care.   If you have a lab appointment with the Cancer Center, please go directly to the Cancer Center and check in at the registration area.   Wear comfortable clothing and clothing appropriate for easy access to any Portacath or PICC line.   We strive to give you quality time with your provider. You may need to reschedule your appointment if you arrive late (15 or more minutes).  Arriving late affects you and other patients whose appointments are after yours.  Also, if you miss three or more appointments without notifying the office, you may be dismissed from the clinic at the provider's discretion.      For prescription refill requests, have your pharmacy contact our office and allow 72 hours for refills to be completed.    Today you received the following chemotherapy and/or immunotherapy agents Keytruda      To help prevent nausea and vomiting after your treatment, we encourage you to take your nausea medication as directed.  BELOW ARE SYMPTOMS THAT SHOULD BE REPORTED IMMEDIATELY: *FEVER GREATER THAN 100.4 F (38 C) OR HIGHER *CHILLS OR SWEATING *NAUSEA AND VOMITING THAT IS NOT CONTROLLED WITH YOUR NAUSEA MEDICATION *UNUSUAL SHORTNESS OF BREATH *UNUSUAL BRUISING OR BLEEDING *URINARY PROBLEMS (pain or burning when urinating, or frequent urination) *BOWEL PROBLEMS (unusual diarrhea, constipation, pain near the anus) TENDERNESS IN MOUTH AND THROAT WITH OR WITHOUT PRESENCE OF ULCERS (sore throat, sores in mouth, or a toothache) UNUSUAL RASH, SWELLING OR PAIN  UNUSUAL VAGINAL DISCHARGE OR ITCHING   Items with * indicate a potential emergency and should be followed up as soon as possible or go to the Emergency Department if any problems should occur.  Please show the CHEMOTHERAPY ALERT CARD or IMMUNOTHERAPY ALERT CARD at  check-in to the Emergency Department and triage nurse.  Should you have questions after your visit or need to cancel or reschedule your appointment, please contact [facility]  Dept: [phone]  and follow the prompts.  Office hours are 8:00 a.m. to 4:30 p.m. Monday - Friday. Please note that voicemails left after 4:00 p.m. may not be returned until the following business day.  We are closed weekends and major holidays. You have access to a nurse at all times for urgent questions. Please call the main number to the clinic Dept: [phone] and follow the prompts.   For any non-urgent questions, you may also contact your provider using MyChart. We now offer e-Visits for anyone 18 and older to request care online for non-urgent symptoms. For details visit [email or web address].   Also download the MyChart app! Go to the app store, search "MyChart", open the app, select [facility], and log in with your MyChart username and password.   

## 2023-02-20 NOTE — Assessment & Plan Note (Signed)
She has multifactorial anemia, component of anemia chronic illness and from prior treatment Observe closely

## 2023-02-20 NOTE — Assessment & Plan Note (Signed)
I reviewed documented blood pressure from home which are within normal range Today, her elevated blood pressure could be due to whitecoat hypertension We will continue current antihypertensives

## 2023-02-20 NOTE — Progress Notes (Signed)
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[facility]) She has stable changes on Jan CT, overall good response The changes seen in the vaginal cuff is stable and that could account for her intermittent vaginal discharge I recommend we continue treatment for a few more months with plan to repeat CT imaging in April  Essential hypertension I reviewed documented blood pressure from home which are within normal range Today, her elevated blood pressure could be due to whitecoat hypertension We will continue current antihypertensives  Anemia due to antineoplastic chemotherapy She has multifactorial anemia, component of anemia chronic illness and from prior treatment Observe closely  Orders Placed This Encounter  Procedures   CT ABDOMEN PELVIS W CONTRAST    Standing Status:   Future    Standing Expiration Date:   02/20/2024    Order Specific Question:   If indicated for the ordered procedure, I authorize the administration of contrast media per Radiology protocol    Answer:   Yes    Order Specific Question:   Preferred imaging location?    Answer:   [facility]    Order Specific Question:   Radiology Contrast Protocol - do NOT remove file path    Answer:   [email or web address]    All questions were answered. The patient knows to call the clinic with any problems, questions or concerns. The total time spent in the appointment was 20 minutes encounter with patients including review of chart and various tests results, discussions about plan of care and coordination of care plan   Heath Lark, MD 02/20/2023 11:01 AM  INTERVAL HISTORY: Please see below for problem oriented charting. she returns for treatment follow-up on Lenvima and pembrolizumab She  brought with her documented blood pressure from home They are within normal range She continues to have intermittent vaginal discharge No other forms of bleeding Overall, she tolerated treatment very well  REVIEW OF SYSTEMS:   Constitutional: Denies fevers, chills or abnormal weight loss Eyes: Denies blurriness of vision Ears, nose, mouth, throat, and face: Denies mucositis or sore throat Respiratory: Denies cough, dyspnea or wheezes Cardiovascular: Denies palpitation, chest discomfort or lower extremity swelling Gastrointestinal:  Denies nausea, heartburn or change in bowel habits Skin: Denies abnormal skin rashes Lymphatics: Denies new lymphadenopathy or easy bruising Neurological:Denies numbness, tingling or new weaknesses Behavioral/Psych: Mood is stable, no new changes  All other systems were reviewed with the patient and are negative.  I have reviewed the past medical history, past surgical history, social history and family history with the patient and they are unchanged from previous note.  ALLERGIES:  has No Known Allergies.  MEDICATIONS:  Current Outpatient Medications  Medication Sig Dispense Refill   amLODipine (NORVASC) 10 MG tablet Take 1 tablet (10 mg total) by mouth daily. 90 tablet 0   aspirin 81 MG EC tablet Take 1 tablet (81 mg total) by mouth daily. 30 tablet 12   atorvastatin (LIPITOR) 80 MG tablet Take 1 tablet (80 mg total) by mouth at bedtime. 90 tablet 0   Dulaglutide (TRULICITY) 1.5 0000000 SOPN Inject 1.5 mg into the skin once a week. 2 mL 6   glimepiride (AMARYL) 4 MG tablet Take 1 tablet (4 mg total) by mouth daily with breakfast. 30 tablet 0   isosorbide mononitrate (IMDUR) 30 MG 24 hr tablet Take 2  tablets (60 mg total) by mouth daily. 180 tablet 0   lenvatinib 10 mg daily dose (LENVIMA, 10 MG DAILY DOSE,) capsule Take 1 capsule (10 mg total) by mouth daily. 30 capsule 11   lidocaine-prilocaine (EMLA) cream Apply 1 Application topically as needed. 30 g  0   lisinopril (ZESTRIL) 10 MG tablet Take 3 tablets (30 mg total) by mouth daily. 90 tablet 0   metFORMIN (GLUCOPHAGE) 500 MG tablet Take 2 tablets (1,000 mg total) by mouth 2 (two) times daily with a meal. 360 tablet 0   metoprolol tartrate (LOPRESSOR) 100 MG tablet Take 1 tablet (100 mg total) by mouth 2 (two) times daily. 180 tablet 0   nitroGLYCERIN (NITROSTAT) 0.4 MG SL tablet Place 1 tablet (0.4 mg total) under the tongue every 5 (five) minutes as needed for chest pain. 25 tablet 2   No current facility-administered medications for this visit.    SUMMARY OF ONCOLOGIC HISTORY: Oncology History Overview Note  Pap 12/14/20: adenocarcinoma, HPV negative  HER-2 positive by FISH Her-2 equivocal by [facility]  MSI stable    Papillary serous adenocarcinoma of endometrium ([place])   Initial Diagnosis   Endometrial carcinoma ([place])   01/12/2021 Initial Biopsy   A. ENDOCERVIX, CURETTAGE:  - Adenocarcinoma.  B. ENDOMETRIUM, BIOPSY:  - Adenocarcinoma.  COMMENT:  The differential diagnosis includes high grade serous carcinoma.  Both  specimens have a similar morphology; high grade serous carcinoma more  commonly arises in the endometrium.  Results reported to Dr. Hale Bogus  on 01/15/2021.  Dr. Saralyn Pilar reviewed the case.    01/16/2021 Tumor Marker   Patient's tumor was tested for the following markers: CA-125 Results of the tumor marker test revealed normal value, 10.7   01/25/2021 Imaging   CT C/A/P: 1. Small volume fluid in the endometrial canal with 7 mm hypoattenuating lesion in the myometrium of the anterior fundus. 2. No definite evidence for metastatic disease in the chest, abdomen, or pelvis. Small lymph nodes are noted along both pelvic sidewalls. Attention on follow-up recommended. 3. 5 mm ground-glass opacity peripheral left upper lobe. This is likely related to infection/inflammation and potentially scar. Attention on surveillance imaging recommended. 4. 5.8 cm lesion posterior  lower uterine segment likely a fibroid. Ultrasound exam from 01/28/2010 demonstrated a 5.2 cm lesion in the left aspect of the posterior lower uterine body. 5. Aortic Atherosclerosis (ICD10-I70.0).   01/29/2021 Surgery   TRH/BSO, SLN biopsy left, selective pelvic LND right, omentectomy  Findings: On EUA, 10-12cm enlarged mobile uterus. On intra-abdomina entry, minimal adhesions between the liver and anterior abdomen on the right. Some changes c/w fatty liver on the left. Omentum, stomach, small and large bowel all grossly normal. Bilateral ovaries normal appearing. Uterus with 5-6cm posterior fibroid, otherwise normal appearing. No mapping to right pelvis. On left, mapping to the level of the superior vessel artery, enlarged lymph node noted (likely sentinel) within the upper aspect of the obturator space. In bilateral pelvic basins, multiple enlarged lymph nodes. On the right, enlarged lymph nodes extended superiorly along the common iliac vessels. At the end of surgery, no obvious abdominal or pelvic evidence of disease.   01/29/2021 Pathology Results   A. SENTINEL LYMPH NODE, LEFT INTERNAL ILIAC, BIOPSY:  - Metastatic carcinoma in (1) of (1) lymph node.   B. SENTINEL LYMPH NODE, LEFT OBTURATOR, BIOPSY:  - Metastatic carcinoma in (1) of (1) lymph node.   C. LYMPH NODES, LEFT PELVIC, DISSECTION:  - Metastatic carcinoma in (1) of (3) lymph nodes.  D. LYMPH NODE, RIGHT EXTERNAL AND COMMON ILIAC, BIOPSY:  - Metastatic carcinoma in (1) of (1) lymph node.   E. UTERUS, CERVIX AND BILATERAL FALLOPIAN TUBES AND OVARIES, TOTAL  HYSTERECTOMY AND BILATERAL SALPINGO-OOPHORECTOMY:  - High grade serous carcinoma of endometrium, with invasion more than  half of the myometrium.  - Tumor invades the stromal connective tissue of the cervix.  - No involvement of uterine serosa or adnexa.  - Lymphovascular invasion is identified.  - See oncology table.   F. OMENTUM, OMENTECTOMY:  - Omentum, negative  for carcinoma.   G. LYMPH NODES, RIGHT PELVIC, DISSECTION:  - Two lymph nodes, negative for carcinoma (0/2).   ONCOLOGY TABLE:   UTERUS, CARCINOMA OR CARCINOSARCOMA: Resection   Procedure: Total hysterectomy and bilateral salpingo-oophorectomy,  Omentectomy, Lymph node sampling  Histologic Type: Serous carcinoma  Histologic Grade: High grade  Myometrial Invasion: > 50%  Uterine Serosa Involvement: Not identified  Cervical Stroma Involvement: Present  Other Tissue/Organ Involvement: Not identified  Peritoneal/Ascitic Fluid: Negative for carcinoma  Lymphovascular Invasion: Present  Regional Lymph Nodes:       Pelvic Lymph Nodes Examined:            2 Sentinel            6 Non-Sentinel            8 Total       Pelvic Lymph Nodes with Metastasis: 4            Macrometastasis: 3            Micrometastasis: 1            Isolated Tumor Cells: 0            Laterality of Lymph Nodes with Tumor: Right (non-sentinel),  Left (sentinel and non-sentinel)            Extracapsular Extension: Present       Para-Aortic Lymph Nodes Examined: Not applicable        Para-Aortic Lymph Nodes with Metastasis: Not applicable  Distant Metastasis:       Distant Site(s) Involved: Omentum: Not involved  Pathologic Stage Classification (pTNM, AJCC 8th Edition): pT2, pN1a  Ancillary Studies: HER2 will be ordered  Additional Findings: Leiomyomata.  Adenomyosis.  Representative Tumor Block: B1  Comment: Dr. Saralyn Pilar reviewed select slides.  (v4.2.0.1)    01/29/2021 Cancer Staging   Staging form: Corpus Uteri - Carcinoma and Carcinosarcoma, AJCC 8th Edition - Clinical stage from 01/29/2021: FIGO Stage IIIC1 (cT1b, cN1a, cM0) - Signed by Lafonda Mosses, MD on 02/02/2021 Histopathologic type: Mixed cell adenocarcinoma Stage prefix: Initial diagnosis Method of lymph node assessment: Other Histologic grade (G): G3 Histologic grading system: 3 grade system Lymph-vascular invasion (LVI): LVI  present/identified, NOS Peritoneal cytology results: Negative Pelvic nodal status: Positive Number of pelvic nodes positive from dissection: 4 Number of pelvic nodes examined during dissection: 8 Para-aortic status: Not assessed Lymph node metastasis: Present Omentectomy performed: Yes Morcellation performed: No   02/19/2021 Echocardiogram    1. Left ventricular ejection fraction, by estimation, is 55 to 60%. The left ventricle has normal function. The left ventricle demonstrates regional wall motion abnormalities (see scoring diagram/findings for description). There is mild left ventricular  hypertrophy. Left ventricular diastolic parameters are consistent with Grade I diastolic dysfunction (impaired relaxation). There is moderate hypokinesis of the left ventricular, basal septal wall and inferior wall. The average left ventricular global longitudinal strain is -17.9 %. The global longitudinal strain is abnormal.  2. Right  ventricular systolic function is normal. The right ventricular size is normal. There is normal pulmonary artery systolic pressure. The estimated right ventricular systolic pressure is 0000000 mmHg.  3. The mitral valve is abnormal. Trivial mitral valve regurgitation.  4. The aortic valve is tricuspid. Aortic valve regurgitation is not visualized.  5. The inferior vena cava is normal in size with greater than 50% respiratory variability, suggesting right atrial pressure of 3 mmHg.   02/20/2021 Procedure   Successful placement of a right IJ approach Power Port with ultrasound and fluoroscopic guidance. The catheter is ready for use.   02/28/2021 - 06/14/2021 Chemotherapy    Patient is on Treatment Plan: UTERINE CARBOPLATIN AUC 6 / PACLITAXEL Q21D       04/20/2021 Imaging   Status post hysterectomy and suspected bilateral salpingo-oophorectomy.   2.2 cm fluid density lesion along the left pelvic sidewall likely reflects a postoperative seroma.   No findings suspicious for  recurrent or metastatic disease.   07/16/2021 - 08/16/2021 Radiation Therapy   Radiation Treatment Dates: 07/16/2021 through 08/16/2021 Site Technique Total Dose (Gy) Dose per Fx (Gy) Completed Fx Beam Energies  Vagina: Pelvis HDR-brachy 30/30 6 5/5 Ir-192        10/01/2021 Imaging   Increasing size of LEFT pelvic lymph nodes, largest approximately 11 mm along the external iliac chain. Given findings and history could consider PET for further evaluation as warranted.   Cystic area along the LEFT pelvic sidewall that was seen previously has improved.   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   Aortic Atherosclerosis (ICD10-I70.0).   12/21/2021 Imaging   1. Enlarging soft tissue masses involving the vaginal cuff. 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 Speculum examination and re-biopsy may be confirmatory. PET-CT may be helpful. 2. New small sub 5 mm perirectal and sigmoid mesocolon nodes. 3. Slight progression of pelvic adenopathy as detailed above. 4. No findings suspicious for abdominal metastatic disease or osseous metastatic disease. 5. Stable age advanced atherosclerotic calcifications involving the aorta and branch vessels and coronary arteries   12/25/2021 Echocardiogram    1. Global longitudinal strain is -16.1% Mild basal inferior hypoinesis. Compared to echo from 02/2021, no change in overall LVEF /regional wall motion. Global longitudinal strain is mildly less negative (-17.9% to -16.1%. Left ventricular ejection fraction, by estimation, is 65 to 70%. The left ventricle has normal function. The left ventricle has no regional wall motion abnormalities. There is mild left ventricular hypertrophy. Left ventricular diastolic parameters are indeterminate.  2. Right ventricular systolic function is normal. The right ventricular size is normal. There is normal pulmonary  artery systolic pressure.  3. Trivial mitral valve regurgitation.  4. The aortic valve is normal in structure. Aortic valve regurgitation is not visualized.  5. The inferior vena cava is normal in size with greater than 50% respiratory variability, suggesting right atrial pressure of 3 mmHg.     01/01/2022 - 01/22/2022 Chemotherapy   Patient is on Treatment Plan : UTERINE SEROUS CARCINOMA Carboplatin + Paclitaxel + Trastuzumab q21d x 6 Cycles / Trastuzumab q21d     03/29/2022 Pathology Results   A.   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   COMMENT:   The carcinoma has diffuse strong Pax8 immunoreactivity (Mullerian marker) and abnormal overexpression of nuclear p53 and relatively diffuse  strong p16 staining.  Estrogen receptor (ER) is weakly reactive  and there is focal strong Napsin A immunoreactivity.  The immunophenotype is most characteristic of serous carcinoma.   The history of prior treatment for endometrial serous carcinoma is noted.   04/12/2022 Imaging   1. Surgical change of prior hysterectomy with increased nodular thickening at the vaginal cuff, suspicious for recurrent disease. Consider further evaluation with PET-CT versus direct tissue sampling. 2. Unchanged prominent/mildly enlarged retroperitoneal and pelvic lymph nodes, nonspecific but in the setting of disease recurrence these would be at least somewhat suspicious for nodal involvement. Consider attention on follow-up imaging. 3. No evidence of metastatic disease within the chest. 4. Left-sided colonic diverticulosis without findings of acute diverticulitis. 5.  Aortic Atherosclerosis (ICD10-I70.0).   04/16/2022 - 08/09/2022 Chemotherapy   Patient is on Treatment Plan : UTERINE Lenvatinib (20) D1-21 + Pembrolizumab (200) D1 q21d     04/16/2022 -  Chemotherapy   Patient is on Treatment Plan  : UTERINE Lenvatinib (20) D1-21 + Pembrolizumab (200) D1 q21d     07/09/2022 Imaging   Mild increase in mild left para-aortic and left iliac lymphadenopathy, consistent with metastatic lymphadenopathy.   Stable nodular thickening of vaginal cuff, consistent with vaginal recurrence.   Colonic diverticulosis, without radiographic evidence of diverticulitis.   Probable tiny calcified gallstone. No radiographic evidence of cholecystitis.   Aortic Atherosclerosis (ICD10-I70.0).     09/12/2022 Imaging   1. Mild decrease in thickening along the vaginal cuff. 2. LEFT iliac and LEFT periaortic retroperitoneal adenopathy is stable to slightly decreased. 3. No evidence of new lymphadenopathy or progressive metastatic uterine carcinoma.      12/19/2022 Imaging   Persistent thickening of the walls of the vaginal cuff with some luminal fluid and air. The focal nodular component measured previously is less well-defined today relative to the adjacent wall thickening.   Persistent lymph node enlargement in the retroperitoneum and left pelvic sidewall. Some areas are similar and others are slightly increased. No new areas of lymph node enlargement.    Gallstones.       PHYSICAL EXAMINATION: ECOG PERFORMANCE STATUS: 1 - Symptomatic but completely ambulatory  Vitals:   02/20/23 1056  BP: 132/72  Pulse: 75  Resp: 16  Temp: 98.3 F (36.8 C)  SpO2: 100%   Filed Weights   02/20/23 1056  Weight: 209 lb 3.2 oz (94.9 kg)    GENERAL:alert, no distress and comfortable  NEURO: alert & oriented x 3 with fluent speech, no focal motor/sensory deficits  LABORATORY DATA:  I have reviewed the data as listed    Component Value Date/Time   NA 141 01/31/2023 1043   NA 143 10/18/2020 1028   K 4.3 01/31/2023 1043   CL 108 01/31/2023 1043   CO2 25 01/31/2023 1043   GLUCOSE 116 (H) 01/31/2023 1043   BUN 23 01/31/2023 1043   BUN 13 10/18/2020 1028   CREATININE 0.99 01/31/2023 1043   CREATININE 0.79  02/11/2017 0943   CALCIUM 9.1 01/31/2023 1043   PROT 6.9 01/31/2023 1043   PROT 6.7 10/18/2020 1028   ALBUMIN 3.6 01/31/2023 1043   ALBUMIN 4.1 10/18/2020 1028   AST 10 (L) 01/31/2023 1043   ALT 9 01/31/2023 1043   ALKPHOS 89 01/31/2023 1043   BILITOT 0.3 01/31/2023 1043   GFRNONAA >60 01/31/2023 1043   GFRNONAA 83 02/11/2017 0943   GFRAA 70 10/18/2020 1028   GFRAA >89 02/11/2017 0943    No results found for: "SPEP", "UPEP"  Lab Results  Component Value Date   WBC 6.2 02/20/2023   NEUTROABS 4.2 02/20/2023   HGB 10.7 (L) 02/20/2023   HCT 32.3 (L) 02/20/2023   MCV 93.9 02/20/2023   PLT 264 02/20/2023      Chemistry      Component Value Date/Time   NA 141 01/31/2023 1043   NA 143 10/18/2020 1028   K 4.3 01/31/2023 1043   CL 108 01/31/2023 1043   CO2 25 01/31/2023 1043   BUN 23 01/31/2023 1043   BUN 13 10/18/2020 1028   CREATININE 0.99 01/31/2023 1043   CREATININE 0.79 02/11/2017 0943      Component Value Date/Time   CALCIUM 9.1 01/31/2023 1043   ALKPHOS 89 01/31/2023 1043   AST 10 (L) 01/31/2023 1043   ALT 9 01/31/2023 1043   BILITOT 0.3 01/31/2023 1043

## 2023-02-20 NOTE — Progress Notes (Signed)
Patient outreached by Estelle June, PharmD Candidate on 02/20/23 to discuss hypertension. Spoke to daughter throughout the phone call.    Patient has an automated home blood pressure machine. They report home readings in the 140-150s, despite full adherence to medications. Denies symptoms of low and high BP. Denies smoking and drinking.    Medication review was performed. They are taking medications as prescribed.   The following barriers to adherence were noted:  - They do not have cost concerns.  - They do not have transportation concerns.  - They do not need assistance obtaining refills.  - They do not occasionally forget to take some of their prescribed medications.  - They do not feel like one/some of their medications make them feel poorly.  - They do not have questions or concerns about their medications.  - They do have follow up scheduled with their primary care provider/cardiologist.   The following interventions were completed:  - Medications were reviewed  - Patient was educated on goal blood pressures (<130/80) and long term health implications of elevated blood pressure  - Patient was reminded to bring home machine and readings to next provider appointment  - Patient was counseled on lifestyle modifications to improve blood pressure, including eating 3 healthy meals per day, exercising as much as tolerable with the goal of 30 minutes/day for 5 days.   The patient has follow up scheduled: 03/17/23  PCP: [name], MD    [facility] PharmD Candidate [phone]   Maryan Puls, PharmD PGY-1 [facility] Pharmacy Resident

## 2023-02-20 NOTE — Assessment & Plan Note (Signed)
She has stable changes on Jan CT, overall good response The changes seen in the vaginal cuff is stable and that could account for her intermittent vaginal discharge I recommend we continue treatment for a few more months with plan to repeat CT imaging in April

## 2023-02-21 ENCOUNTER — Other Ambulatory Visit (HOSPITAL_COMMUNITY): Payer: Self-pay

## 2023-02-24 ENCOUNTER — Other Ambulatory Visit (HOSPITAL_COMMUNITY): Payer: Self-pay

## 2023-02-28 ENCOUNTER — Encounter: Payer: Self-pay | Admitting: Hematology and Oncology

## 2023-03-11 ENCOUNTER — Other Ambulatory Visit (HOSPITAL_COMMUNITY): Payer: Self-pay

## 2023-03-13 ENCOUNTER — Other Ambulatory Visit (HOSPITAL_COMMUNITY): Payer: Self-pay

## 2023-03-17 ENCOUNTER — Other Ambulatory Visit: Payer: Self-pay

## 2023-03-17 ENCOUNTER — Encounter: Payer: Self-pay | Admitting: Hematology and Oncology

## 2023-03-17 ENCOUNTER — Ambulatory Visit: Payer: Medicaid Other | Attending: Family Medicine | Admitting: Family Medicine

## 2023-03-17 VITALS — BP 130/83 | HR 73 | Ht 62.0 in | Wt 213.6 lb

## 2023-03-17 DIAGNOSIS — Z1211 Encounter for screening for malignant neoplasm of colon: Secondary | ICD-10-CM

## 2023-03-17 DIAGNOSIS — B354 Tinea corporis: Secondary | ICD-10-CM | POA: Diagnosis not present

## 2023-03-17 DIAGNOSIS — E1159 Type 2 diabetes mellitus with other circulatory complications: Secondary | ICD-10-CM

## 2023-03-17 DIAGNOSIS — E1142 Type 2 diabetes mellitus with diabetic polyneuropathy: Secondary | ICD-10-CM

## 2023-03-17 DIAGNOSIS — Z1231 Encounter for screening mammogram for malignant neoplasm of breast: Secondary | ICD-10-CM

## 2023-03-17 DIAGNOSIS — E785 Hyperlipidemia, unspecified: Secondary | ICD-10-CM

## 2023-03-17 DIAGNOSIS — E1169 Type 2 diabetes mellitus with other specified complication: Secondary | ICD-10-CM

## 2023-03-17 DIAGNOSIS — I5032 Chronic diastolic (congestive) heart failure: Secondary | ICD-10-CM

## 2023-03-17 DIAGNOSIS — I110 Hypertensive heart disease with heart failure: Secondary | ICD-10-CM

## 2023-03-17 DIAGNOSIS — I10 Essential (primary) hypertension: Secondary | ICD-10-CM

## 2023-03-17 DIAGNOSIS — T451X5A Adverse effect of antineoplastic and immunosuppressive drugs, initial encounter: Secondary | ICD-10-CM

## 2023-03-17 DIAGNOSIS — G620 Drug-induced polyneuropathy: Secondary | ICD-10-CM

## 2023-03-17 LAB — POCT GLYCOSYLATED HEMOGLOBIN (HGB A1C): HbA1c, POC (controlled diabetic range): 7 % (ref 0.0–7.0)

## 2023-03-17 MED ORDER — ISOSORBIDE MONONITRATE ER 30 MG PO TB24
60.0000 mg | ORAL_TABLET | Freq: Every day | ORAL | 1 refills | Status: DC
Start: 2023-03-17 — End: 2023-05-29
  Filled 2023-03-17: qty 180, 90d supply, fill #0

## 2023-03-17 MED ORDER — TIRZEPATIDE 10 MG/0.5ML ~~LOC~~ SOAJ
10.0000 mg | SUBCUTANEOUS | 6 refills | Status: DC
  Filled 2023-03-17: qty 2, 28d supply, fill #0
  Filled 2023-04-13: qty 2, 28d supply, fill #1
  Filled 2023-05-13: qty 2, 28d supply, fill #2

## 2023-03-17 MED ORDER — GABAPENTIN 300 MG PO CAPS
300.0000 mg | ORAL_CAPSULE | Freq: Every day | ORAL | 1 refills | Status: DC
  Filled 2023-03-17: qty 90, 90d supply, fill #0
  Filled 2023-06-25: qty 90, 90d supply, fill #1

## 2023-03-17 MED ORDER — GLIMEPIRIDE 4 MG PO TABS
4.0000 mg | ORAL_TABLET | Freq: Every day | ORAL | 1 refills | Status: DC
Start: 2023-03-17 — End: 2023-05-29
  Filled 2023-03-17: qty 90, 90d supply, fill #0

## 2023-03-17 MED ORDER — TERBINAFINE HCL 250 MG PO TABS
250.0000 mg | ORAL_TABLET | Freq: Every day | ORAL | 0 refills | Status: DC
  Filled 2023-03-17: qty 30, 30d supply, fill #0

## 2023-03-17 MED ORDER — ATORVASTATIN CALCIUM 80 MG PO TABS
80.0000 mg | ORAL_TABLET | Freq: Every day | ORAL | 1 refills | Status: DC
Start: 2023-03-17 — End: 2023-09-24
  Filled 2023-03-17: qty 90, 90d supply, fill #0

## 2023-03-17 MED ORDER — METOPROLOL TARTRATE 100 MG PO TABS
100.0000 mg | ORAL_TABLET | Freq: Two times a day (BID) | ORAL | 1 refills | Status: DC
Start: 2023-03-17 — End: 2023-09-24
  Filled 2023-03-17: qty 180, 90d supply, fill #0

## 2023-03-17 MED ORDER — AMLODIPINE BESYLATE 10 MG PO TABS
10.0000 mg | ORAL_TABLET | Freq: Every day | ORAL | 1 refills | Status: DC
  Filled 2023-03-17: qty 90, 90d supply, fill #0

## 2023-03-17 MED ORDER — LISINOPRIL 10 MG PO TABS
30.0000 mg | ORAL_TABLET | Freq: Every day | ORAL | 1 refills | Status: DC
Start: 2023-03-17 — End: 2023-05-29
  Filled 2023-03-17: qty 270, 90d supply, fill #0

## 2023-03-17 MED ORDER — METFORMIN HCL 500 MG PO TABS
1000.0000 mg | ORAL_TABLET | Freq: Two times a day (BID) | ORAL | 1 refills | Status: DC
Start: 2023-03-17 — End: 2023-09-24
  Filled 2023-03-17: qty 360, 90d supply, fill #0

## 2023-03-17 NOTE — Progress Notes (Signed)
Subjective:  Patient ID: [name], female    DOB: 28-Oct-1956  Age: 67 y.o. MRN: [id number]  CC: Diabetes   HPI [name] is a 67 y.o. year old female with a history of  type 2 diabetes mellitus (A1c 7.0), dyslipidemia, coronary artery disease status post  DES stent (in 08/2015 completed anticoagulation with Brilinta and aspirin for 12 months, restarted on Plavix by cardiology due to multiple stents and occlusion from recent cardiac cath), HFpEF (EF 60-65% mild LVH, grade 1 DD, from 06/2022), essential hypertension, COVID-19 11/2020 s/p MAB infusion, Adenocarcinoma of Endometrium status post hysterectomy, status post radiation and chemo) She is accompanied by her daughter to this visit.  Interval History:  Seen by Dr. Alvy Bimler 1 month ago and per notes she has stable changes on CT from 12/2022.  Plan is to continue treatment and repeat CT this month.  Per patient she has not had her Chemo in 3 weeks due to ?  Elevated BP. Today her blood pressure is normal and she endorses adherence with her antihypertensive.  She has neuropathy in her feet and has been using an OTC cream for this. She is doing well on her diabetic regimen and remains on Trulicity, tolerating her medications okay.  Also on a statin.  She is not up-to-date on annual eye exams but promises to schedule an appointment with her ophthalmologist. From a cardiac standpoint she has no dyspnea, chest pains or palpitations.  She has had a rash in her lower back for close to a year and this has been pruritic.  Rash is starting to develop on her right knee as well.  Past Medical History:  Diagnosis Date   Anginal pain ([place]) 2016   Cancer [facility])    Coronary atherosclerosis of native coronary artery, with stent to LCX in 2006 06/04/2013   Diabetes mellitus without complication ([place])    GERD (gastroesophageal reflux disease)    Heart attack ([place]) 2006   History of radiation therapy 08/16/2021   HDR brachytherapy  07/16/2021-08/16/2021  Dr Gery Pray   Hyperlipidemia LDL goal < 70 06/07/2013   Hypertension    Obesity    S/P coronary artery stent placement to RCA with PROMUS DES, 06/07/13, residual disease on OM1, OM2 and rPDA 06/07/2013    Past Surgical History:  Procedure Laterality Date   CARDIAC CATHETERIZATION N/A 08/22/2015   Procedure: Left Heart Cath and Coronary Angiography;  Surgeon: Leonie Man, MD;  Location: [place] [facility];  Service: Cardiovascular;  Laterality: N/A;   CARDIAC CATHETERIZATION N/A 08/22/2015   Procedure: Coronary Stent Intervention;  Surgeon: Leonie Man, MD;  Location: [place] [facility];  Service: Cardiovascular;  Laterality: N/A;   CARDIAC CATHETERIZATION N/A 08/22/2015   Procedure: Left Heart Cath and Coronary Angiography;  Surgeon: Leonie Man, MD;  Location: [place] [facility];  Service: Cardiovascular;  Laterality: N/A;   CORONARY STENT PLACEMENT  2006   TAXUS stent CFX in [facility]   IR IMAGING GUIDED PORT INSERTION  02/20/2021   LEFT HEART CATH AND CORONARY ANGIOGRAPHY N/A 07/21/2018   Procedure: LEFT HEART CATH AND CORONARY ANGIOGRAPHY;  Surgeon: Troy Sine, MD;  Location: [place] [facility];  Service: Cardiovascular;  Laterality: N/A;   LEFT HEART CATHETERIZATION WITH CORONARY ANGIOGRAM N/A 06/07/2013   Procedure: LEFT HEART CATHETERIZATION WITH CORONARY ANGIOGRAM;  Surgeon: Sanda Klein, MD;  Location: [place] [facility];  Service: Cardiovascular;  Laterality: N/A;   LYMPH NODE DISSECTION N/A 01/29/2021  Procedure: SELECTIVE LYMPH NODE DISSECTION;  Surgeon: Lafonda Mosses, MD;  Location: [facility];  Service: Gynecology;  Laterality: N/A;   ROBOTIC ASSISTED TOTAL HYSTERECTOMY WITH BILATERAL SALPINGO OOPHERECTOMY Bilateral 01/29/2021   Procedure: XI ROBOTIC ASSISTED TOTAL HYSTERECTOMY WITH BILATERAL SALPINGO OOPHORECTOMY,;  Surgeon: Lafonda Mosses, MD;  Location: [facility];  Service: Gynecology;  Laterality: Bilateral;   SENTINEL NODE BIOPSY N/A 01/29/2021    Procedure: SENTINEL NODE BIOPSY;  Surgeon: Lafonda Mosses, MD;  Location: [facility];  Service: Gynecology;  Laterality: N/A;    Family History  Problem Relation Age of Onset   Colon cancer Mother    Diabetes Mother    Hypertension Mother    Colon cancer Father    Diabetes Sister    Diabetes Brother    Hypertension Brother    Breast cancer Neg Hx    Ovarian cancer Neg Hx    Endometrial cancer Neg Hx    Prostate cancer Neg Hx    Pancreatic cancer Neg Hx     Social History   Socioeconomic History   Marital status: Divorced    Spouse name: Not on file   Number of children: 1   Years of education: Not on file   Highest education level: 12th grade  Occupational History   Occupation: Scientist, water quality  Tobacco Use   Smoking status: Former    Types: Cigars    Quit date: 06/23/2004    Years since quitting: 18.7   Smokeless tobacco: Never   Tobacco comments:    "a few black and milds a day" CigrettesX 30 years  Vaping Use   Vaping Use: Never used  Substance and Sexual Activity   Alcohol use: Yes    Comment: occ   Drug use: No   Sexual activity: Not Currently  Other Topics Concern   Not on file  Social History Narrative   Not on file   Social Determinants of Health   Financial Resource Strain: Low Risk  (03/16/2023)   Overall Financial Resource Strain (CARDIA)    Difficulty of Paying Living Expenses: Not hard at all  Food Insecurity: No Food Insecurity (03/16/2023)   Hunger Vital Sign    Worried About Running Out of Food in the Last Year: Never true    Ran Out of Food in the Last Year: Never true  Transportation Needs: No Transportation Needs (03/16/2023)   PRAPARE - Hydrologist (Medical): No    Lack of Transportation (Non-Medical): No  Physical Activity: Unknown (03/16/2023)   Exercise Vital Sign    Days of Exercise per Week: 0 days    Minutes of Exercise per Session: Not on file  Stress: No Stress Concern Present (03/16/2023)   [place]    Feeling of Stress : Not at all  Social Connections: Socially Isolated (03/16/2023)   Social Connection and Isolation Panel [NHANES]    Frequency of Communication with Friends and Family: Three times a week    Frequency of Social Gatherings with Friends and Family: Once a week    Attends Religious Services: Never    Marine scientist or Organizations: No    Attends Music therapist: Not on file    Marital Status: Divorced    No Known Allergies  Outpatient Medications Prior to Visit  Medication Sig Dispense Refill   aspirin 81 MG EC tablet Take 1 tablet (81 mg total) by mouth daily. 30 tablet 12  lenvatinib 10 mg daily dose (LENVIMA, 10 MG DAILY DOSE,) capsule Take 1 capsule (10 mg total) by mouth daily. 30 capsule 11   lidocaine-prilocaine (EMLA) cream Apply 1 Application topically as needed. 30 g 0   nitroGLYCERIN (NITROSTAT) 0.4 MG SL tablet Place 1 tablet (0.4 mg total) under the tongue every 5 (five) minutes as needed for chest pain. 25 tablet 2   amLODipine (NORVASC) 10 MG tablet Take 1 tablet (10 mg total) by mouth daily. 90 tablet 0   atorvastatin (LIPITOR) 80 MG tablet Take 1 tablet (80 mg total) by mouth at bedtime. 90 tablet 0   Dulaglutide (TRULICITY) 1.5 0000000 SOPN Inject 1.5 mg into the skin once a week. 2 mL 6   glimepiride (AMARYL) 4 MG tablet Take 1 tablet (4 mg total) by mouth daily with breakfast. 30 tablet 0   isosorbide mononitrate (IMDUR) 30 MG 24 hr tablet Take 2 tablets (60 mg total) by mouth daily. 180 tablet 0   lisinopril (ZESTRIL) 10 MG tablet Take 3 tablets (30 mg total) by mouth daily. 90 tablet 0   metFORMIN (GLUCOPHAGE) 500 MG tablet Take 2 tablets (1,000 mg total) by mouth 2 (two) times daily with a meal. 360 tablet 0   metoprolol tartrate (LOPRESSOR) 100 MG tablet Take 1 tablet (100 mg total) by mouth 2 (two) times daily. 180 tablet 0   No facility-administered medications  prior to visit.     ROS Review of Systems  Constitutional:  Negative for activity change and appetite change.  HENT:  Negative for sinus pressure and sore throat.   Respiratory:  Negative for chest tightness, shortness of breath and wheezing.   Cardiovascular:  Negative for chest pain and palpitations.  Gastrointestinal:  Negative for abdominal distention, abdominal pain and constipation.  Genitourinary: Negative.   Musculoskeletal: Negative.   Skin:  Positive for rash.  Psychiatric/Behavioral:  Negative for behavioral problems and dysphoric mood.     Objective:  BP 130/83   Pulse 73   Ht 5\' 2"  (1.575 m)   Wt 213 lb 9.6 oz (96.9 kg)   SpO2 99%   BMI 39.07 kg/m      03/17/2023    2:18 PM 02/20/2023   12:46 PM 02/20/2023   10:56 AM  BP/Weight  Systolic BP [id number]  Diastolic BP 83 78 72  Wt. (Lbs) 213.6  209.2  BMI 39.07 kg/m2  38.26 kg/m2      Physical Exam Constitutional:      Appearance: She is well-developed.  Cardiovascular:     Rate and Rhythm: Normal rate.     Heart sounds: Normal heart sounds. No murmur heard. Pulmonary:     Effort: Pulmonary effort is normal.     Breath sounds: Normal breath sounds. No wheezing or rales.  Chest:     Chest wall: No tenderness.  Abdominal:     General: Bowel sounds are normal. There is no distension.     Palpations: Abdomen is soft. There is no mass.     Tenderness: There is no abdominal tenderness.  Musculoskeletal:        General: Normal range of motion.     Right lower leg: No edema.     Left lower leg: No edema.  Skin:    Comments: Well-circumscribed plaques in lower back with scaly surface. No lesions around the ears or hairline.  Neurological:     Mental Status: She is alert and oriented to person, place, and time.  Psychiatric:  Mood and Affect: Mood normal.    Diabetic Foot Exam - Simple   Simple Foot Form Diabetic Foot exam was performed with the following findings: Yes 03/17/2023  5:41 PM  Visual  Inspection No deformities, no ulcerations, no other skin breakdown bilaterally: Yes Sensation Testing Intact to touch and monofilament testing bilaterally: Yes Pulse Check Posterior Tibialis and Dorsalis pulse intact bilaterally: Yes Comments         Latest Ref Rng & Units 02/20/2023   10:30 AM 01/31/2023   10:43 AM 01/10/2023   11:18 AM  CMP  Glucose 70 - 99 mg/dL 129  116  107   BUN 8 - 23 mg/dL 19  23  23    Creatinine 0.44 - 1.00 mg/dL 1.01  0.99  1.16   Sodium 135 - 145 mmol/L 141  141  140   Potassium 3.5 - 5.1 mmol/L 4.1  4.3  4.7   Chloride 98 - 111 mmol/L 109  108  106   CO2 22 - 32 mmol/L 24  25  26    Calcium 8.9 - 10.3 mg/dL 9.3  9.1  9.4   Total Protein 6.5 - 8.1 g/dL 6.9  6.9  6.9   Total Bilirubin 0.3 - 1.2 mg/dL 0.3  0.3  0.4   Alkaline Phos 38 - 126 U/L 76  89  85   AST 15 - 41 U/L 10  10  14    ALT 0 - 44 U/L 9  9  11      Lipid Panel     Component Value Date/Time   CHOL 153 09/20/2021 1205   TRIG 101 09/20/2021 1205   HDL 56 09/20/2021 1205   CHOLHDL 2.7 09/20/2021 1205   CHOLHDL 2.4 02/11/2017 1002   VLDL 18 02/11/2017 1002   LDLCALC 79 09/20/2021 1205    CBC    Component Value Date/Time   WBC 6.2 02/20/2023 1030   WBC 3.0 (L) 02/18/2022 0335   RBC 3.44 (L) 02/20/2023 1030   HGB 10.7 (L) 02/20/2023 1030   HCT 32.3 (L) 02/20/2023 1030   PLT 264 02/20/2023 1030   MCV 93.9 02/20/2023 1030   MCH 31.1 02/20/2023 1030   MCHC 33.1 02/20/2023 1030   RDW 14.7 02/20/2023 1030   LYMPHSABS 1.4 02/20/2023 1030   MONOABS 0.6 02/20/2023 1030   EOSABS 0.1 02/20/2023 1030   BASOSABS 0.0 02/20/2023 1030    Lab Results  Component Value Date   HGBA1C 7.0 03/17/2023    Assessment & Plan:  1. Type 2 diabetes mellitus with other circulatory complication, without long-term current use of insulin Controlled with A1c of 7.0 Due to desire for additional weight loss will switch from Trulicity to [employer] on Diabetic diet, my plate method, [id number] minutes  of moderate intensity exercise/week Blood sugar logs with fasting goals of 80-120 mg/dl, random of less than 180 and in the event of sugars less than 60 mg/dl or greater than 400 mg/dl encouraged to notify the clinic. Advised on the need for annual eye exams, annual foot exams, Pneumonia vaccine. - POCT glycosylated hemoglobin (Hb A1C) - tirzepatide (MOUNJARO) 10 MG/0.5ML Pen; Inject 10 mg into the skin once a week.  Dispense: 6 mL; Refill: 6 - Microalbumin/Creatinine Ratio, Urine - glimepiride (AMARYL) 4 MG tablet; Take 1 tablet (4 mg total) by mouth daily with breakfast.  Dispense: 90 tablet; Refill: 1 - metFORMIN (GLUCOPHAGE) 500 MG tablet; Take 2 tablets (1,000 mg total) by mouth 2 (two) times daily with a meal.  Dispense:  360 tablet; Refill: 1  2. Tinea corporis She does have multiple low back lesions suspicious for tinea infection.  Her immunosuppressed state could also be contributing to this. Offered to refer to dermatology for possible biopsy but she would like to try medication first. Placed on Lamisil Nummular eczema is also a differential - terbinafine (LAMISIL) 250 MG tablet; Take 1 tablet (250 mg total) by mouth daily.  Dispense: 90 tablet; Refill: 0  3. Diabetic polyneuropathy associated with type 2 diabetes mellitus Uncontrolled Combination of diabetic polyneuropathy and chemo induced neuropathy Counseled on adverse effects of gabapentin and after shared decision making we will initiate based - gabapentin (NEURONTIN) 300 MG capsule; Take 1 capsule (300 mg total) by mouth at bedtime.  Dispense: 90 capsule; Refill: 1  4.  Hyperlipidemia associated with type 2 diabetes mellitus Controlled Low-cholesterol diet - atorvastatin (LIPITOR) 80 MG tablet; Take 1 tablet (80 mg total) by mouth at bedtime.  Dispense: 90 tablet; Refill: 1  5. Hypertensive heart disease with chronic diastolic congestive heart failure EF of 60 to 65% Blood pressure is controlled Continue current  regimen Follow-up with cardiology - amLODipine (NORVASC) 10 MG tablet; Take 1 tablet (10 mg total) by mouth daily.  Dispense: 90 tablet; Refill: 1 - isosorbide mononitrate (IMDUR) 30 MG 24 hr tablet; Take 2 tablets (60 mg total) by mouth daily.  Dispense: 180 tablet; Refill: 1 - lisinopril (ZESTRIL) 10 MG tablet; Take 3 tablets (30 mg total) by mouth daily.  Dispense: 270 tablet; Refill: 1  6. Essential hypertension Controlled - metoprolol tartrate (LOPRESSOR) 100 MG tablet; Take 1 tablet (100 mg total) by mouth 2 (two) times daily.  Dispense: 180 tablet; Refill: 1  7. Screening for colon cancer - Cologuard  8. Peripheral neuropathy due to chemotherapy See #3 above - gabapentin (NEURONTIN) 300 MG capsule; Take 1 capsule (300 mg total) by mouth at bedtime.  Dispense: 90 capsule; Refill: 1  9. Encounter for screening mammogram for malignant neoplasm of breast - MM 3D SCREENING MAMMOGRAM BILATERAL BREAST; Future   Meds ordered this encounter  Medications   tirzepatide (MOUNJARO) 10 MG/0.5ML Pen    Sig: Inject 10 mg into the skin once a week.    Dispense:  6 mL    Refill:  6    Discontinue Trulicity   gabapentin (NEURONTIN) 300 MG capsule    Sig: Take 1 capsule (300 mg total) by mouth at bedtime.    Dispense:  90 capsule    Refill:  1   terbinafine (LAMISIL) 250 MG tablet    Sig: Take 1 tablet (250 mg total) by mouth daily.    Dispense:  90 tablet    Refill:  0   amLODipine (NORVASC) 10 MG tablet    Sig: Take 1 tablet (10 mg total) by mouth daily.    Dispense:  90 tablet    Refill:  1   atorvastatin (LIPITOR) 80 MG tablet    Sig: Take 1 tablet (80 mg total) by mouth at bedtime.    Dispense:  90 tablet    Refill:  1   glimepiride (AMARYL) 4 MG tablet    Sig: Take 1 tablet (4 mg total) by mouth daily with breakfast.    Dispense:  90 tablet    Refill:  1   isosorbide mononitrate (IMDUR) 30 MG 24 hr tablet    Sig: Take 2 tablets (60 mg total) by mouth daily.    Dispense:   180 tablet    Refill:  1  lisinopril (ZESTRIL) 10 MG tablet    Sig: Take 3 tablets (30 mg total) by mouth daily.    Dispense:  270 tablet    Refill:  1   metFORMIN (GLUCOPHAGE) 500 MG tablet    Sig: Take 2 tablets (1,000 mg total) by mouth 2 (two) times daily with a meal.    Dispense:  360 tablet    Refill:  1   metoprolol tartrate (LOPRESSOR) 100 MG tablet    Sig: Take 1 tablet (100 mg total) by mouth 2 (two) times daily.    Dispense:  180 tablet    Refill:  1    Follow-up: Return in about 6 months (around 09/19/2023) for Chronic medical conditions.       [name], MD, FAAFP. [facility] [place], [place]   03/17/2023, 5:44 PM

## 2023-03-17 NOTE — Patient Instructions (Signed)
Body Ringworm Body ringworm is an infection of the skin that often causes a ring-shaped rash. Body ringworm is also called tinea corporis. Body ringworm can affect any part of your skin. This condition is easily spread from person to person (is very contagious). What are the causes? This condition is caused by fungi called dermatophytes. The condition develops when these fungi grow out of control on the skin. You can get this condition if you touch a person or animal that has it. You can also get it if you share any items with an infected person or pet. These include: Clothing, bedding, and towels. Brushes or combs. Gym equipment. Any other object that has the fungus on it. What increases the risk? You are more likely to develop this condition if you: Play sports that involve close physical contact, such as wrestling. Sweat a lot. Live in areas that are hot and humid. Use public showers. Have a weakened disease-fighting system (immune system). What are the signs or symptoms? Symptoms of this condition include: Itchy, raised red spots and bumps. Red scaly patches. A ring-shaped rash. The rash may have: A clear center. Scales or red bumps at its center. Redness near its borders. Dry and scaly skin on or around it. How is this diagnosed? This condition can usually be diagnosed with a skin exam. A skin scraping may be taken from the affected area and examined under a microscope to see if the fungus is present. How is this treated? This condition may be treated with: An antifungal cream or ointment. An antifungal shampoo. Antifungal medicines. These may be prescribed if your ringworm: Is severe. Keeps coming back or lasts a long time. Follow these instructions at home: Take over-the-counter and prescription medicines only as told by your health care provider. If you were given an antifungal cream or ointment: Use it as told by your health care provider. Wash the infected area and  dry it completely before applying the cream or ointment. If you were given an antifungal shampoo: Use it as told by your health care provider. Leave the shampoo on your body for 3-5 minutes before rinsing. While you have a rash: Wear loose clothing to stop clothes from rubbing and irritating it. Wash or change your bed sheets every night. Wash clothes and bed sheets in hot water. Disinfect or throw out items that may be infected. Wash your hands often with soap and water for at least 20 seconds. If soap and water are not available, use hand sanitizer. If your pet has the same infection, take your pet to see a veterinarian for treatment. How is this prevented? Take a bath or shower every day and after every time you work out or play sports. Dry your skin completely after bathing. Wear sandals or shoes in public places and showers. Wash athletic clothes after each use. Do not share personal items with others. Avoid touching red patches of skin on other people. Avoid touching pets that have bald spots. If you touch an animal that has a bald spot, wash your hands. Contact a health care provider if: Your rash continues to spread after 7 days of treatment. Your rash is not gone in 4 weeks. The area around your rash gets red, warm, tender, and swollen. This information is not intended to replace advice given to you by your health care provider. Make sure you discuss any questions you have with your health care provider. Document Revised: 05/16/2022 Document Reviewed: 05/16/2022 Elsevier Patient Education  2023 Elsevier Inc.  

## 2023-03-17 NOTE — Progress Notes (Signed)
Oncologist wants you to adjust BP medications due to elevated BP.  Dry patches on skin.

## 2023-03-18 ENCOUNTER — Other Ambulatory Visit: Payer: Self-pay

## 2023-03-18 ENCOUNTER — Other Ambulatory Visit (HOSPITAL_COMMUNITY): Payer: Self-pay

## 2023-03-18 ENCOUNTER — Other Ambulatory Visit: Payer: Self-pay | Admitting: Family Medicine

## 2023-03-18 DIAGNOSIS — E1159 Type 2 diabetes mellitus with other circulatory complications: Secondary | ICD-10-CM

## 2023-03-18 LAB — MICROALBUMIN / CREATININE URINE RATIO
Creatinine, Urine: 135.1 mg/dL
Microalb/Creat Ratio: 1035 mg/g creat — ABNORMAL HIGH (ref 0–29)
Microalbumin, Urine: 1398.9 ug/mL

## 2023-03-18 MED ORDER — EMPAGLIFLOZIN 10 MG PO TABS
10.0000 mg | ORAL_TABLET | Freq: Every day | ORAL | 1 refills | Status: DC
  Filled 2023-03-18: qty 90, 90d supply, fill #0

## 2023-03-19 ENCOUNTER — Other Ambulatory Visit: Payer: Self-pay

## 2023-03-19 ENCOUNTER — Telehealth: Payer: Self-pay

## 2023-03-19 NOTE — Telephone Encounter (Signed)
Oral Oncology Pharmacist Encounter  Was notified by Dr. Alvy Bimler that patient may resume the lenvatinib (lenvima) today on 03/18/23. Patient and her daughter were notified. Patient has ~2 boxes of therapy at this time and will restart the medication. Patient agrees with plan with Dr. Alvy Bimler.   Drema Halon, PharmD Hematology/Oncology Clinical Pharmacist [facility] [phone]

## 2023-03-20 ENCOUNTER — Ambulatory Visit (HOSPITAL_COMMUNITY)
Admission: RE | Admit: 2023-03-20 | Discharge: 2023-03-20 | Disposition: A | Discharge status: Home / Self Care | Payer: Medicaid Other | Source: Ambulatory Visit | Attending: Hematology and Oncology | Admitting: Hematology and Oncology

## 2023-03-20 DIAGNOSIS — C541 Malignant neoplasm of endometrium: Secondary | ICD-10-CM | POA: Insufficient documentation

## 2023-03-20 MED ORDER — IOHEXOL 300 MG/ML  SOLN
100.0000 mL | Freq: Once | INTRAMUSCULAR | Status: AC | PRN
  Administered 2023-03-20: 100 mL via INTRAVENOUS

## 2023-03-20 MED ORDER — SODIUM CHLORIDE (PF) 0.9 % IJ SOLN
INTRAMUSCULAR | Status: AC
  Filled 2023-03-20: qty 50

## 2023-03-20 MED ORDER — HEPARIN SOD (PORK) LOCK FLUSH 100 UNIT/ML IV SOLN
500.0000 [IU] | Freq: Once | INTRAVENOUS | Status: AC
  Administered 2023-03-20: 500 [IU] via INTRAVENOUS

## 2023-03-20 MED ORDER — HEPARIN SOD (PORK) LOCK FLUSH 100 UNIT/ML IV SOLN
INTRAVENOUS | Status: AC
  Filled 2023-03-20: qty 5

## 2023-03-21 ENCOUNTER — Inpatient Hospital Stay: Payer: Medicaid Other

## 2023-03-21 ENCOUNTER — Inpatient Hospital Stay: Payer: Medicaid Other | Attending: Gynecologic Oncology

## 2023-03-21 ENCOUNTER — Encounter: Payer: Self-pay | Admitting: Hematology and Oncology

## 2023-03-21 ENCOUNTER — Inpatient Hospital Stay (HOSPITAL_BASED_OUTPATIENT_CLINIC_OR_DEPARTMENT_OTHER): Payer: Medicaid Other | Admitting: Hematology and Oncology

## 2023-03-21 VITALS — BP 146/59 | HR 62 | Temp 97.8°F | Resp 18 | Ht 62.0 in | Wt 212.6 lb

## 2023-03-21 DIAGNOSIS — Z7982 Long term (current) use of aspirin: Secondary | ICD-10-CM | POA: Insufficient documentation

## 2023-03-21 DIAGNOSIS — Z9221 Personal history of antineoplastic chemotherapy: Secondary | ICD-10-CM | POA: Diagnosis not present

## 2023-03-21 DIAGNOSIS — K760 Fatty (change of) liver, not elsewhere classified: Secondary | ICD-10-CM | POA: Diagnosis not present

## 2023-03-21 DIAGNOSIS — I3481 Nonrheumatic mitral (valve) annulus calcification: Secondary | ICD-10-CM | POA: Insufficient documentation

## 2023-03-21 DIAGNOSIS — K5730 Diverticulosis of large intestine without perforation or abscess without bleeding: Secondary | ICD-10-CM | POA: Insufficient documentation

## 2023-03-21 DIAGNOSIS — I10 Essential (primary) hypertension: Secondary | ICD-10-CM

## 2023-03-21 DIAGNOSIS — I700 Atherosclerosis of aorta: Secondary | ICD-10-CM | POA: Insufficient documentation

## 2023-03-21 DIAGNOSIS — I2510 Atherosclerotic heart disease of native coronary artery without angina pectoris: Secondary | ICD-10-CM | POA: Insufficient documentation

## 2023-03-21 DIAGNOSIS — D259 Leiomyoma of uterus, unspecified: Secondary | ICD-10-CM | POA: Diagnosis not present

## 2023-03-21 DIAGNOSIS — C541 Malignant neoplasm of endometrium: Secondary | ICD-10-CM

## 2023-03-21 DIAGNOSIS — E1122 Type 2 diabetes mellitus with diabetic chronic kidney disease: Secondary | ICD-10-CM | POA: Diagnosis not present

## 2023-03-21 DIAGNOSIS — N1831 Chronic kidney disease, stage 3a: Secondary | ICD-10-CM

## 2023-03-21 DIAGNOSIS — Z923 Personal history of irradiation: Secondary | ICD-10-CM | POA: Insufficient documentation

## 2023-03-21 DIAGNOSIS — I129 Hypertensive chronic kidney disease with stage 1 through stage 4 chronic kidney disease, or unspecified chronic kidney disease: Secondary | ICD-10-CM | POA: Insufficient documentation

## 2023-03-21 DIAGNOSIS — E1165 Type 2 diabetes mellitus with hyperglycemia: Secondary | ICD-10-CM | POA: Insufficient documentation

## 2023-03-21 DIAGNOSIS — R590 Localized enlarged lymph nodes: Secondary | ICD-10-CM | POA: Insufficient documentation

## 2023-03-21 DIAGNOSIS — Z7984 Long term (current) use of oral hypoglycemic drugs: Secondary | ICD-10-CM | POA: Diagnosis not present

## 2023-03-21 DIAGNOSIS — E1169 Type 2 diabetes mellitus with other specified complication: Secondary | ICD-10-CM | POA: Diagnosis not present

## 2023-03-21 DIAGNOSIS — Z5112 Encounter for antineoplastic immunotherapy: Secondary | ICD-10-CM | POA: Diagnosis present

## 2023-03-21 DIAGNOSIS — Z79899 Other long term (current) drug therapy: Secondary | ICD-10-CM | POA: Diagnosis not present

## 2023-03-21 DIAGNOSIS — E669 Obesity, unspecified: Secondary | ICD-10-CM

## 2023-03-21 LAB — CBC WITH DIFFERENTIAL (CANCER CENTER ONLY)
Abs Immature Granulocytes: 0.02 10*3/uL (ref 0.00–0.07)
Basophils Absolute: 0 10*3/uL (ref 0.0–0.1)
Basophils Relative: 0 %
Eosinophils Absolute: 0.1 10*3/uL (ref 0.0–0.5)
Eosinophils Relative: 1 %
HCT: 32.7 % — ABNORMAL LOW (ref 36.0–46.0)
Hemoglobin: 10.4 g/dL — ABNORMAL LOW (ref 12.0–15.0)
Immature Granulocytes: 0 %
Lymphocytes Relative: 23 %
Lymphs Abs: 1.3 10*3/uL (ref 0.7–4.0)
MCH: 30.2 pg (ref 26.0–34.0)
MCHC: 31.8 g/dL (ref 30.0–36.0)
MCV: 95.1 fL (ref 80.0–100.0)
Monocytes Absolute: 0.4 10*3/uL (ref 0.1–1.0)
Monocytes Relative: 8 %
Neutro Abs: 3.8 10*3/uL (ref 1.7–7.7)
Neutrophils Relative %: 68 %
Platelet Count: 306 10*3/uL (ref 150–400)
RBC: 3.44 MIL/uL — ABNORMAL LOW (ref 3.87–5.11)
RDW: 14.8 % (ref 11.5–15.5)
WBC Count: 5.6 10*3/uL (ref 4.0–10.5)
nRBC: 0 % (ref 0.0–0.2)

## 2023-03-21 LAB — CMP (CANCER CENTER ONLY)
ALT: 12 U/L (ref 0–44)
AST: 10 U/L — ABNORMAL LOW (ref 15–41)
Albumin: 3.5 g/dL (ref 3.5–5.0)
Alkaline Phosphatase: 91 U/L (ref 38–126)
Anion gap: 7 (ref 5–15)
BUN: 22 mg/dL (ref 8–23)
CO2: 23 mmol/L (ref 22–32)
Calcium: 9.2 mg/dL (ref 8.9–10.3)
Chloride: 109 mmol/L (ref 98–111)
Creatinine: 1.25 mg/dL — ABNORMAL HIGH (ref 0.44–1.00)
GFR, Estimated: 48 mL/min — ABNORMAL LOW (ref >60)
Glucose, Bld: 246 mg/dL — ABNORMAL HIGH (ref 70–99)
Potassium: 4.1 mmol/L (ref 3.5–5.1)
Sodium: 139 mmol/L (ref 135–145)
Total Bilirubin: 0.3 mg/dL (ref 0.3–1.2)
Total Protein: 6.9 g/dL (ref 6.5–8.1)

## 2023-03-21 LAB — TSH: TSH: 1.191 u[IU]/mL (ref 0.350–4.500)

## 2023-03-21 LAB — TOTAL PROTEIN, URINE DIPSTICK: Protein, ur: 100 mg/dL — AB

## 2023-03-21 MED ORDER — SODIUM CHLORIDE 0.9 % IV SOLN
200.0000 mg | Freq: Once | INTRAVENOUS | Status: AC
  Administered 2023-03-21: 200 mg via INTRAVENOUS
  Filled 2023-03-21: qty 200

## 2023-03-21 MED ORDER — SODIUM CHLORIDE 0.9 % IV SOLN: Freq: Once | INTRAVENOUS | Status: AC

## 2023-03-21 MED ORDER — SODIUM CHLORIDE 0.9% FLUSH
10.0000 mL | Freq: Once | INTRAVENOUS | Status: AC
  Administered 2023-03-21: 10 mL

## 2023-03-21 MED ORDER — HEPARIN SOD (PORK) LOCK FLUSH 100 UNIT/ML IV SOLN
500.0000 [IU] | Freq: Once | INTRAVENOUS | Status: AC | PRN
  Administered 2023-03-21: 500 [IU]

## 2023-03-21 MED ORDER — SODIUM CHLORIDE 0.9% FLUSH
10.0000 mL | INTRAVENOUS | Status: DC | PRN
  Administered 2023-03-21: 10 mL

## 2023-03-21 NOTE — Assessment & Plan Note (Signed)
She has poorly controlled diabetes We discussed importance of lifestyle changes while on treatment

## 2023-03-21 NOTE — Patient Instructions (Signed)
[facility]  Discharge Instructions: Thank you for choosing [facility] to provide your oncology and hematology care.   If you have a lab appointment with the Cancer Center, please go directly to the Cancer Center and check in at the registration area.   Wear comfortable clothing and clothing appropriate for easy access to any Portacath or PICC line.   We strive to give you quality time with your provider. You may need to reschedule your appointment if you arrive late (15 or more minutes).  Arriving late affects you and other patients whose appointments are after yours.  Also, if you miss three or more appointments without notifying the office, you may be dismissed from the clinic at the provider's discretion.      For prescription refill requests, have your pharmacy contact our office and allow 72 hours for refills to be completed.    Today you received the following chemotherapy and/or immunotherapy agents: Keytruda      To help prevent nausea and vomiting after your treatment, we encourage you to take your nausea medication as directed.  BELOW ARE SYMPTOMS THAT SHOULD BE REPORTED IMMEDIATELY: *FEVER GREATER THAN 100.4 F (38 C) OR HIGHER *CHILLS OR SWEATING *NAUSEA AND VOMITING THAT IS NOT CONTROLLED WITH YOUR NAUSEA MEDICATION *UNUSUAL SHORTNESS OF BREATH *UNUSUAL BRUISING OR BLEEDING *URINARY PROBLEMS (pain or burning when urinating, or frequent urination) *BOWEL PROBLEMS (unusual diarrhea, constipation, pain near the anus) TENDERNESS IN MOUTH AND THROAT WITH OR WITHOUT PRESENCE OF ULCERS (sore throat, sores in mouth, or a toothache) UNUSUAL RASH, SWELLING OR PAIN  UNUSUAL VAGINAL DISCHARGE OR ITCHING   Items with * indicate a potential emergency and should be followed up as soon as possible or go to the Emergency Department if any problems should occur.  Please show the CHEMOTHERAPY ALERT CARD or IMMUNOTHERAPY ALERT CARD at  check-in to the Emergency Department and triage nurse.  Should you have questions after your visit or need to cancel or reschedule your appointment, please contact [facility]  Dept: [phone]  and follow the prompts.  Office hours are 8:00 a.m. to 4:30 p.m. Monday - Friday. Please note that voicemails left after 4:00 p.m. may not be returned until the following business day.  We are closed weekends and major holidays. You have access to a nurse at all times for urgent questions. Please call the main number to the clinic Dept: [phone] and follow the prompts.   For any non-urgent questions, you may also contact your provider using MyChart. We now offer e-Visits for anyone 18 and older to request care online for non-urgent symptoms. For details visit [email or web address].   Also download the MyChart app! Go to the app store, search "MyChart", open the app, select [facility], and log in with your MyChart username and password.   

## 2023-03-21 NOTE — Assessment & Plan Note (Signed)
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

## 2023-03-21 NOTE — Assessment & Plan Note (Signed)
I reviewed documented blood pressure from home which are within normal range Today, her elevated blood pressure could be due to whitecoat hypertension We will continue current antihypertensives 

## 2023-03-21 NOTE — Progress Notes (Signed)
[facility] OFFICE PROGRESS NOTE  Patient Care Team: Hoy Register, MD as PCP - General (Family Medicine) Croitoru, Rachelle Hora, MD as PCP - Cardiology (Cardiology)  ASSESSMENT & PLAN:  Papillary serous adenocarcinoma of endometrium [facility]) I reviewed multiple imaging studies with the patient and her daughter Overall, she has positive response to therapy She has persistent disease in the vaginal cuff but overall I do not think it is worse We discussed expectant management with supportive care only She is not a candidate for further surgery or radiation I plan to repeat imaging study again in 3 months around July We discussed importance of lifestyle changes and risk factor modifications  Type 2 diabetes mellitus with obesity She has poorly controlled diabetes We discussed importance of lifestyle changes while on treatment  Essential hypertension I reviewed documented blood pressure from home which are within normal range Today, her elevated blood pressure could be due to whitecoat hypertension We will continue current antihypertensives  Chronic kidney disease, stage 3a (HCC) Her oral intake remain poor but much improved  Orders Placed This Encounter  Procedures   CBC with Differential (Cancer Center Only)    Standing Status:   Future    Standing Expiration Date:   04/10/2024   CMP (Cancer Center only)    Standing Status:   Future    Standing Expiration Date:   04/10/2024   CBC with Differential (Cancer Center Only)    Standing Status:   Future    Standing Expiration Date:   05/01/2024   CMP (Cancer Center only)    Standing Status:   Future    Standing Expiration Date:   05/01/2024    All questions were answered. The patient knows to call the clinic with any problems, questions or concerns. The total time spent in the appointment was 30 minutes encounter with patients including review of chart and various tests results, discussions about plan of care and coordination  of care plan   Artis Delay, MD 03/21/2023 4:53 PM  INTERVAL HISTORY: Please see below for problem oriented charting. she returns for treatment follow-up and review of CT imaging results She is compliant taking her medications as directed She stated that her blood pressure at home are generally within normal limits She has no recent signs or symptoms of congestive heart failure  REVIEW OF SYSTEMS:   Constitutional: Denies fevers, chills or abnormal weight loss Eyes: Denies blurriness of vision Ears, nose, mouth, throat, and face: Denies mucositis or sore throat Respiratory: Denies cough, dyspnea or wheezes Cardiovascular: Denies palpitation, chest discomfort or lower extremity swelling Gastrointestinal:  Denies nausea, heartburn or change in bowel habits Skin: Denies abnormal skin rashes Lymphatics: Denies new lymphadenopathy or easy bruising Neurological:Denies numbness, tingling or new weaknesses Behavioral/Psych: Mood is stable, no new changes  All other systems were reviewed with the patient and are negative.  I have reviewed the past medical history, past surgical history, social history and family history with the patient and they are unchanged from previous note.  ALLERGIES:  has No Known Allergies.  MEDICATIONS:  Current Outpatient Medications  Medication Sig Dispense Refill   amLODipine (NORVASC) 10 MG tablet Take 1 tablet (10 mg total) by mouth daily. 90 tablet 1   aspirin 81 MG EC tablet Take 1 tablet (81 mg total) by mouth daily. 30 tablet 12   atorvastatin (LIPITOR) 80 MG tablet Take 1 tablet (80 mg total) by mouth at bedtime. 90 tablet 1   empagliflozin (JARDIANCE) 10 MG TABS tablet Take  1 tablet (10 mg total) by mouth daily before breakfast. 90 tablet 1   gabapentin (NEURONTIN) 300 MG capsule Take 1 capsule (300 mg total) by mouth at bedtime. 90 capsule 1   glimepiride (AMARYL) 4 MG tablet Take 1 tablet (4 mg total) by mouth daily with breakfast. 90 tablet 1    isosorbide mononitrate (IMDUR) 30 MG 24 hr tablet Take 2 tablets (60 mg total) by mouth daily. 180 tablet 1   lenvatinib 10 mg daily dose (LENVIMA, 10 MG DAILY DOSE,) capsule Take 1 capsule (10 mg total) by mouth daily. 30 capsule 11   lidocaine-prilocaine (EMLA) cream Apply 1 Application topically as needed. 30 g 0   lisinopril (ZESTRIL) 10 MG tablet Take 3 tablets (30 mg total) by mouth daily. 270 tablet 1   metFORMIN (GLUCOPHAGE) 500 MG tablet Take 2 tablets (1,000 mg total) by mouth 2 (two) times daily with a meal. 360 tablet 1   metoprolol tartrate (LOPRESSOR) 100 MG tablet Take 1 tablet (100 mg total) by mouth 2 (two) times daily. 180 tablet 1   nitroGLYCERIN (NITROSTAT) 0.4 MG SL tablet Place 1 tablet (0.4 mg total) under the tongue every 5 (five) minutes as needed for chest pain. 25 tablet 2   terbinafine (LAMISIL) 250 MG tablet Take 1 tablet (250 mg total) by mouth daily. 90 tablet 0   tirzepatide (MOUNJARO) 10 MG/0.5ML Pen Inject 10 mg into the skin once a week. 6 mL 6   No current facility-administered medications for this visit.   Facility-Administered Medications Ordered in Other Visits  Medication Dose Route Frequency Provider Last Rate Last Admin   sodium chloride flush (NS) 0.9 % injection 10 mL  10 mL Intracatheter PRN Bertis RuddyGorsuch, Elianna Windom, MD   10 mL at 03/21/23 1213    SUMMARY OF ONCOLOGIC HISTORY: Oncology History Overview Note  Pap 12/14/20: adenocarcinoma, HPV negative  HER-2 positive by FISH Her-2 equivocal by [facility]  MSI stable    Papillary serous adenocarcinoma of endometrium   Initial Diagnosis   Endometrial carcinoma (HCC)   01/12/2021 Initial Biopsy   A. ENDOCERVIX, CURETTAGE:  - Adenocarcinoma.  B. ENDOMETRIUM, BIOPSY:  - Adenocarcinoma.  COMMENT:  The differential diagnosis includes high grade serous carcinoma.  Both  specimens have a similar morphology; high grade serous carcinoma more  commonly arises in the endometrium.  Results reported to Dr. Valentina ShaggyMary Miller   on 01/15/2021.  Dr. Luisa HartPatrick reviewed the case.    01/16/2021 Tumor Marker   Patient's tumor was tested for the following markers: CA-125 Results of the tumor marker test revealed normal value, 10.7   01/25/2021 Imaging   CT C/A/P: 1. Small volume fluid in the endometrial canal with 7 mm hypoattenuating lesion in the myometrium of the anterior fundus. 2. No definite evidence for metastatic disease in the chest, abdomen, or pelvis. Small lymph nodes are noted along both pelvic sidewalls. Attention on follow-up recommended. 3. 5 mm ground-glass opacity peripheral left upper lobe. This is likely related to infection/inflammation and potentially scar. Attention on surveillance imaging recommended. 4. 5.8 cm lesion posterior lower uterine segment likely a fibroid. Ultrasound exam from 01/28/2010 demonstrated a 5.2 cm lesion in the left aspect of the posterior lower uterine body. 5. Aortic Atherosclerosis (ICD10-I70.0).   01/29/2021 Surgery   TRH/BSO, SLN biopsy left, selective pelvic LND right, omentectomy  Findings: On EUA, 10-12cm enlarged mobile uterus. On intra-abdomina entry, minimal adhesions between the liver and anterior abdomen on the right. Some changes c/w fatty liver on the left. Omentum,  stomach, small and large bowel all grossly normal. Bilateral ovaries normal appearing. Uterus with 5-6cm posterior fibroid, otherwise normal appearing. No mapping to right pelvis. On left, mapping to the level of the superior vessel artery, enlarged lymph node noted (likely sentinel) within the upper aspect of the obturator space. In bilateral pelvic basins, multiple enlarged lymph nodes. On the right, enlarged lymph nodes extended superiorly along the common iliac vessels. At the end of surgery, no obvious abdominal or pelvic evidence of disease.   01/29/2021 Pathology Results   A. SENTINEL LYMPH NODE, LEFT INTERNAL ILIAC, BIOPSY:  - Metastatic carcinoma in (1) of (1) lymph node.   B. SENTINEL  LYMPH NODE, LEFT OBTURATOR, BIOPSY:  - Metastatic carcinoma in (1) of (1) lymph node.   C. LYMPH NODES, LEFT PELVIC, DISSECTION:  - Metastatic carcinoma in (1) of (3) lymph nodes.   D. LYMPH NODE, RIGHT EXTERNAL AND COMMON ILIAC, BIOPSY:  - Metastatic carcinoma in (1) of (1) lymph node.   E. UTERUS, CERVIX AND BILATERAL FALLOPIAN TUBES AND OVARIES, TOTAL  HYSTERECTOMY AND BILATERAL SALPINGO-OOPHORECTOMY:  - High grade serous carcinoma of endometrium, with invasion more than  half of the myometrium.  - Tumor invades the stromal connective tissue of the cervix.  - No involvement of uterine serosa or adnexa.  - Lymphovascular invasion is identified.  - See oncology table.   F. OMENTUM, OMENTECTOMY:  - Omentum, negative for carcinoma.   G. LYMPH NODES, RIGHT PELVIC, DISSECTION:  - Two lymph nodes, negative for carcinoma (0/2).   ONCOLOGY TABLE:   UTERUS, CARCINOMA OR CARCINOSARCOMA: Resection   Procedure: Total hysterectomy and bilateral salpingo-oophorectomy,  Omentectomy, Lymph node sampling  Histologic Type: Serous carcinoma  Histologic Grade: High grade  Myometrial Invasion: > 50%  Uterine Serosa Involvement: Not identified  Cervical Stroma Involvement: Present  Other Tissue/Organ Involvement: Not identified  Peritoneal/Ascitic Fluid: Negative for carcinoma  Lymphovascular Invasion: Present  Regional Lymph Nodes:       Pelvic Lymph Nodes Examined:            2 Sentinel            6 Non-Sentinel            8 Total       Pelvic Lymph Nodes with Metastasis: 4            Macrometastasis: 3            Micrometastasis: 1            Isolated Tumor Cells: 0            Laterality of Lymph Nodes with Tumor: Right (non-sentinel),  Left (sentinel and non-sentinel)            Extracapsular Extension: Present       Para-Aortic Lymph Nodes Examined: Not applicable        Para-Aortic Lymph Nodes with Metastasis: Not applicable  Distant Metastasis:       Distant Site(s) Involved:  Omentum: Not involved  Pathologic Stage Classification (pTNM, AJCC 8th Edition): pT2, pN1a  Ancillary Studies: HER2 will be ordered  Additional Findings: Leiomyomata.  Adenomyosis.  Representative Tumor Block: B1  Comment: Dr. Luisa Hart reviewed select slides.  (v4.2.0.1)    01/29/2021 Cancer Staging   Staging form: Corpus Uteri - Carcinoma and Carcinosarcoma, AJCC 8th Edition - Clinical stage from 01/29/2021: FIGO Stage IIIC1 (cT1b, cN1a, cM0) - Signed by Carver Fila, MD on 02/02/2021 Histopathologic type: Mixed cell adenocarcinoma Stage prefix: Initial diagnosis Method of  lymph node assessment: Other Histologic grade (G): G3 Histologic grading system: 3 grade system Lymph-vascular invasion (LVI): LVI present/identified, NOS Peritoneal cytology results: Negative Pelvic nodal status: Positive Number of pelvic nodes positive from dissection: 4 Number of pelvic nodes examined during dissection: 8 Para-aortic status: Not assessed Lymph node metastasis: Present Omentectomy performed: Yes Morcellation performed: No   02/19/2021 Echocardiogram    1. Left ventricular ejection fraction, by estimation, is 55 to 60%. The left ventricle has normal function. The left ventricle demonstrates regional wall motion abnormalities (see scoring diagram/findings for description). There is mild left ventricular  hypertrophy. Left ventricular diastolic parameters are consistent with Grade I diastolic dysfunction (impaired relaxation). There is moderate hypokinesis of the left ventricular, basal septal wall and inferior wall. The average left ventricular global longitudinal strain is -17.9 %. The global longitudinal strain is abnormal.  2. Right ventricular systolic function is normal. The right ventricular size is normal. There is normal pulmonary artery systolic pressure. The estimated right ventricular systolic pressure is 21.7 mmHg.  3. The mitral valve is abnormal. Trivial mitral valve regurgitation.  4.  The aortic valve is tricuspid. Aortic valve regurgitation is not visualized.  5. The inferior vena cava is normal in size with greater than 50% respiratory variability, suggesting right atrial pressure of 3 mmHg.   02/20/2021 Procedure   Successful placement of a right IJ approach Power Port with ultrasound and fluoroscopic guidance. The catheter is ready for use.   02/28/2021 - 06/14/2021 Chemotherapy    Patient is on Treatment Plan: UTERINE CARBOPLATIN AUC 6 / PACLITAXEL Q21D       04/20/2021 Imaging   Status post hysterectomy and suspected bilateral salpingo-oophorectomy.   2.2 cm fluid density lesion along the left pelvic sidewall likely reflects a postoperative seroma.   No findings suspicious for recurrent or metastatic disease.   07/16/2021 - 08/16/2021 Radiation Therapy   Radiation Treatment Dates: 07/16/2021 through 08/16/2021 Site Technique Total Dose (Gy) Dose per Fx (Gy) Completed Fx Beam Energies  Vagina: Pelvis HDR-brachy 30/30 6 5/5 Ir-192        10/01/2021 Imaging   Increasing size of LEFT pelvic lymph nodes, largest approximately 11 mm along the external iliac chain. Given findings and history could consider PET for further evaluation as warranted.   Cystic area along the LEFT pelvic sidewall that was seen previously has improved.   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   Aortic Atherosclerosis (ICD10-I70.0).   12/21/2021 Imaging   1. Enlarging soft tissue masses involving the vaginal cuff. 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 Speculum examination and re-biopsy may be confirmatory. PET-CT may be helpful. 2. New small sub 5 mm perirectal and sigmoid mesocolon nodes. 3. Slight progression of pelvic adenopathy as detailed above. 4. No findings suspicious for abdominal metastatic disease or osseous metastatic disease. 5. Stable age advanced  atherosclerotic calcifications involving the aorta and branch vessels and coronary arteries   12/25/2021 Echocardiogram    1. Global longitudinal strain is -16.1% Mild basal inferior hypoinesis. Compared to echo from 02/2021, no change in overall LVEF /regional wall motion. Global longitudinal strain is mildly less negative (-17.9% to -16.1%. Left ventricular ejection fraction, by estimation, is 65 to 70%. The left ventricle has normal function. The left ventricle has no regional wall motion abnormalities. There is mild left ventricular hypertrophy. Left ventricular diastolic parameters are indeterminate.  2. Right ventricular systolic function is normal.  The right ventricular size is normal. There is normal pulmonary artery systolic pressure.  3. Trivial mitral valve regurgitation.  4. The aortic valve is normal in structure. Aortic valve regurgitation is not visualized.  5. The inferior vena cava is normal in size with greater than 50% respiratory variability, suggesting right atrial pressure of 3 mmHg.     01/01/2022 - 01/22/2022 Chemotherapy   Patient is on Treatment Plan : UTERINE SEROUS CARCINOMA Carboplatin + Paclitaxel + Trastuzumab q21d x 6 Cycles / Trastuzumab q21d     03/29/2022 Pathology Results   A.   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   COMMENT:   The carcinoma has diffuse strong Pax8 immunoreactivity (Mullerian marker) and abnormal overexpression of nuclear p53 and relatively diffuse strong p16 staining.  Estrogen receptor (ER) is weakly reactive  and there is focal strong Napsin A immunoreactivity.  The immunophenotype is most characteristic of serous carcinoma.   The history of prior treatment for endometrial serous carcinoma is noted.   04/12/2022 Imaging   1. Surgical change of prior hysterectomy with increased nodular thickening at the  vaginal cuff, suspicious for recurrent disease. Consider further evaluation with PET-CT versus direct tissue sampling. 2. Unchanged prominent/mildly enlarged retroperitoneal and pelvic lymph nodes, nonspecific but in the setting of disease recurrence these would be at least somewhat suspicious for nodal involvement. Consider attention on follow-up imaging. 3. No evidence of metastatic disease within the chest. 4. Left-sided colonic diverticulosis without findings of acute diverticulitis. 5.  Aortic Atherosclerosis (ICD10-I70.0).   04/16/2022 - 08/09/2022 Chemotherapy   Patient is on Treatment Plan : UTERINE Lenvatinib (20) D1-21 + Pembrolizumab (200) D1 q21d     04/16/2022 -  Chemotherapy   Patient is on Treatment Plan : UTERINE Lenvatinib (20) D1-21 + Pembrolizumab (200) D1 q21d     07/09/2022 Imaging   Mild increase in mild left para-aortic and left iliac lymphadenopathy, consistent with metastatic lymphadenopathy.   Stable nodular thickening of vaginal cuff, consistent with vaginal recurrence.   Colonic diverticulosis, without radiographic evidence of diverticulitis.   Probable tiny calcified gallstone. No radiographic evidence of cholecystitis.   Aortic Atherosclerosis (ICD10-I70.0).     09/12/2022 Imaging   1. Mild decrease in thickening along the vaginal cuff. 2. LEFT iliac and LEFT periaortic retroperitoneal adenopathy is stable to slightly decreased. 3. No evidence of new lymphadenopathy or progressive metastatic uterine carcinoma.      12/19/2022 Imaging   Persistent thickening of the walls of the vaginal cuff with some luminal fluid and air. The focal nodular component measured previously is less well-defined today relative to the adjacent wall thickening.   Persistent lymph node enlargement in the retroperitoneum and left pelvic sidewall. Some areas are similar and others are slightly increased. No new areas of lymph node enlargement.    Gallstones.     03/21/2023 Imaging   1.  Persistent mass-like thickening of the vagina, with increasing nodular component in the right side of the vaginal apex concerning for progressive disease. 2. Previously noted lymphadenopathy in the pelvis and retroperitoneum appears relatively similar to the prior study. 3. No new extranodal sites of metastatic disease noted in the abdomen or pelvis. 4. Trace volume of ascites in the low anatomic pelvis.  5. Colonic diverticulosis without evidence of acute diverticulitis at this time. 6. Cholelithiasis without evidence of acute cholecystitis. 7. Aortic atherosclerosis, in addition to at least 2 vessel  coronary artery disease. Please note that although the presence of coronary artery calcium documents the presence of coronary artery disease, the severity of this disease and any potential stenosis cannot be assessed on this non-gated CT examination. Assessment for potential risk factor modification, dietary therapy or pharmacologic therapy may be warranted, if clinically indicated.     PHYSICAL EXAMINATION: ECOG PERFORMANCE STATUS: 1 - Symptomatic but completely ambulatory  Vitals:   03/21/23 1035  BP: (!) 146/59  Pulse: 62  Resp: 18  Temp: 97.8 F (36.6 C)  SpO2: 100%   Filed Weights   03/21/23 1035  Weight: 212 lb 9.6 oz (96.4 kg)    GENERAL:alert, no distress and comfortable  NEURO: alert & oriented x 3 with fluent speech, no focal motor/sensory deficits  LABORATORY DATA:  I have reviewed the data as listed    Component Value Date/Time   NA 139 03/21/2023 0952   NA 143 10/18/2020 1028   K 4.1 03/21/2023 0952   CL 109 03/21/2023 0952   CO2 23 03/21/2023 0952   GLUCOSE 246 (H) 03/21/2023 0952   BUN 22 03/21/2023 0952   BUN 13 10/18/2020 1028   CREATININE 1.25 (H) 03/21/2023 0952   CREATININE 0.79 02/11/2017 0943   CALCIUM 9.2 03/21/2023 0952   PROT 6.9 03/21/2023 0952   PROT 6.7 10/18/2020 1028   ALBUMIN 3.5 03/21/2023 0952   ALBUMIN 4.1 10/18/2020 1028   AST 10 (L)  03/21/2023 0952   ALT 12 03/21/2023 0952   ALKPHOS 91 03/21/2023 0952   BILITOT 0.3 03/21/2023 0952   GFRNONAA 48 (L) 03/21/2023 0952   GFRNONAA 83 02/11/2017 0943   GFRAA 70 10/18/2020 1028   GFRAA >89 02/11/2017 0943    No results found for: "SPEP", "UPEP"  Lab Results  Component Value Date   WBC 5.6 03/21/2023   NEUTROABS 3.8 03/21/2023   HGB 10.4 (L) 03/21/2023   HCT 32.7 (L) 03/21/2023   MCV 95.1 03/21/2023   PLT 306 03/21/2023      Chemistry      Component Value Date/Time   NA 139 03/21/2023 0952   NA 143 10/18/2020 1028   K 4.1 03/21/2023 0952   CL 109 03/21/2023 0952   CO2 23 03/21/2023 0952   BUN 22 03/21/2023 0952   BUN 13 10/18/2020 1028   CREATININE 1.25 (H) 03/21/2023 0952   CREATININE 0.79 02/11/2017 0943      Component Value Date/Time   CALCIUM 9.2 03/21/2023 0952   ALKPHOS 91 03/21/2023 0952   AST 10 (L) 03/21/2023 0952   ALT 12 03/21/2023 0952   BILITOT 0.3 03/21/2023 0952       RADIOGRAPHIC STUDIES: I have reviewed imaging studies with the patient and her daughter I have personally reviewed the radiological images as listed and agreed with the findings in the report. CT ABDOMEN PELVIS W CONTRAST  Result Date: 03/21/2023 CLINICAL DATA:  67 year old female with history of cervical cancer. Restaging examination. * Tracking Code: BO * EXAM: CT ABDOMEN AND PELVIS WITH CONTRAST TECHNIQUE: Multidetector CT imaging of the abdomen and pelvis was performed using the standard protocol following bolus administration of intravenous contrast. RADIATION DOSE REDUCTION: This exam was performed according to the departmental dose-optimization program which includes automated exposure control, adjustment of the mA and/or kV according to patient size and/or use of iterative reconstruction technique. CONTRAST:  100mL OMNIPAQUE IOHEXOL 300 MG/ML  SOLN COMPARISON:  CT the abdomen and pelvis 12/18/2022. FINDINGS: Lower chest: Atherosclerotic calcifications in the descending  thoracic aorta  as well as the left circumflex and right coronary arteries. Calcifications of the mitral annulus. Hepatobiliary: No suspicious cystic or solid hepatic lesions. No intra or extrahepatic biliary ductal dilatation. Tiny 3 mm calcified gallstone lying dependently in the gallbladder. Gallbladder is not distended. No pericholecystic fluid or surrounding inflammatory changes. Pancreas: No pancreatic mass. No pancreatic ductal dilatation. No pancreatic or peripancreatic fluid collections or inflammatory changes. Spleen: Unremarkable. Adrenals/Urinary Tract: Bilateral kidneys and adrenal glands are normal in appearance. No hydroureteronephrosis. Urinary bladder is unremarkable in appearance. Stomach/Bowel: The appearance of the stomach is normal. No pathologic dilatation of small bowel or colon. Numerous colonic diverticula are noted, without surrounding inflammatory changes to indicate an acute diverticulitis at this time. Normal appendix. Vascular/Lymphatic: Aortic atherosclerosis, without evidence of aneurysm or dissection in the abdominal or pelvic vasculature. Mildly enlarged left external iliac lymph node (axial image 73 of series 2) measuring 1.1 cm in short axis (previously 1.2 cm). Prominent soft tissue just inferior to the aortic bifurcation (axial image 56 of series 2) suspicious for nodal tissue measuring 1.6 x 1.8 cm on today's study (decreased from 2.2 x 2.1 cm on the prior). Mildly enlarged left periaortic lymph node inferior to the left renal hilum (axial image 36 of series 2) measuring 1.0 cm in short axis (previously 1.3 cm). No other definite new lymphadenopathy noted. Reproductive: Status post hysterectomy. Persistent mass-like thickening of the vagina, most evident in the right-side of the vaginal apex (axial image 78 of series 2 and sagittal image 61 of series 6) measuring up to 2.1 x 1.7 x 1.2 cm, where this is more nodular in appearance than previously seen. Ovaries are not confidently  identified, presumably surgically absent. Other: Trace volume of free fluid in the low anatomic pelvis. No larger volume of ascites. No pneumoperitoneum. Musculoskeletal: There are no aggressive appearing lytic or blastic lesions noted in the visualized portions of the skeleton. IMPRESSION: 1. Persistent mass-like thickening of the vagina, with increasing nodular component in the right side of the vaginal apex concerning for progressive disease. 2. Previously noted lymphadenopathy in the pelvis and retroperitoneum appears relatively similar to the prior study. 3. No new extranodal sites of metastatic disease noted in the abdomen or pelvis. 4. Trace volume of ascites in the low anatomic pelvis. 5. Colonic diverticulosis without evidence of acute diverticulitis at this time. 6. Cholelithiasis without evidence of acute cholecystitis. 7. Aortic atherosclerosis, in addition to at least 2 vessel coronary artery disease. Please note that although the presence of coronary artery calcium documents the presence of coronary artery disease, the severity of this disease and any potential stenosis cannot be assessed on this non-gated CT examination. Assessment for potential risk factor modification, dietary therapy or pharmacologic therapy may be warranted, if clinically indicated. Electronically Signed   By: Trudie Reed M.D.   On: 03/21/2023 08:15

## 2023-03-21 NOTE — Assessment & Plan Note (Signed)
Her oral intake remain poor but much improved

## 2023-03-22 ENCOUNTER — Other Ambulatory Visit: Payer: Self-pay

## 2023-04-02 ENCOUNTER — Other Ambulatory Visit: Payer: Self-pay

## 2023-04-04 ENCOUNTER — Other Ambulatory Visit: Payer: Self-pay

## 2023-04-11 ENCOUNTER — Inpatient Hospital Stay (HOSPITAL_BASED_OUTPATIENT_CLINIC_OR_DEPARTMENT_OTHER): Payer: Medicaid Other | Admitting: Hematology and Oncology

## 2023-04-11 ENCOUNTER — Inpatient Hospital Stay: Payer: Medicaid Other

## 2023-04-11 ENCOUNTER — Encounter: Payer: Self-pay | Admitting: Hematology and Oncology

## 2023-04-11 VITALS — BP 133/67 | HR 76 | Temp 98.1°F | Resp 18 | Ht 62.0 in | Wt 202.8 lb

## 2023-04-11 DIAGNOSIS — E1169 Type 2 diabetes mellitus with other specified complication: Secondary | ICD-10-CM

## 2023-04-11 DIAGNOSIS — C541 Malignant neoplasm of endometrium: Secondary | ICD-10-CM

## 2023-04-11 DIAGNOSIS — E669 Obesity, unspecified: Secondary | ICD-10-CM

## 2023-04-11 DIAGNOSIS — I10 Essential (primary) hypertension: Secondary | ICD-10-CM

## 2023-04-11 DIAGNOSIS — Z5112 Encounter for antineoplastic immunotherapy: Secondary | ICD-10-CM | POA: Diagnosis not present

## 2023-04-11 LAB — CBC WITH DIFFERENTIAL (CANCER CENTER ONLY)
Abs Immature Granulocytes: 0.05 10*3/uL (ref 0.00–0.07)
Basophils Absolute: 0 10*3/uL (ref 0.0–0.1)
Basophils Relative: 1 %
Eosinophils Absolute: 0.1 10*3/uL (ref 0.0–0.5)
Eosinophils Relative: 1 %
HCT: 35.6 % — ABNORMAL LOW (ref 36.0–46.0)
Hemoglobin: 11.6 g/dL — ABNORMAL LOW (ref 12.0–15.0)
Immature Granulocytes: 1 %
Lymphocytes Relative: 28 %
Lymphs Abs: 1.7 10*3/uL (ref 0.7–4.0)
MCH: 29.5 pg (ref 26.0–34.0)
MCHC: 32.6 g/dL (ref 30.0–36.0)
MCV: 90.6 fL (ref 80.0–100.0)
Monocytes Absolute: 0.6 10*3/uL (ref 0.1–1.0)
Monocytes Relative: 9 %
Neutro Abs: 3.6 10*3/uL (ref 1.7–7.7)
Neutrophils Relative %: 60 %
Platelet Count: 304 10*3/uL (ref 150–400)
RBC: 3.93 MIL/uL (ref 3.87–5.11)
RDW: 13.6 % (ref 11.5–15.5)
WBC Count: 6 10*3/uL (ref 4.0–10.5)
nRBC: 0 % (ref 0.0–0.2)

## 2023-04-11 LAB — CMP (CANCER CENTER ONLY)
ALT: 9 U/L (ref 0–44)
AST: 11 U/L — ABNORMAL LOW (ref 15–41)
Albumin: 3.7 g/dL (ref 3.5–5.0)
Alkaline Phosphatase: 98 U/L (ref 38–126)
Anion gap: 7 (ref 5–15)
BUN: 30 mg/dL — ABNORMAL HIGH (ref 8–23)
CO2: 24 mmol/L (ref 22–32)
Calcium: 9.7 mg/dL (ref 8.9–10.3)
Chloride: 108 mmol/L (ref 98–111)
Creatinine: 1.17 mg/dL — ABNORMAL HIGH (ref 0.44–1.00)
GFR, Estimated: 52 mL/min — ABNORMAL LOW (ref >60)
Glucose, Bld: 93 mg/dL (ref 70–99)
Potassium: 4.3 mmol/L (ref 3.5–5.1)
Sodium: 139 mmol/L (ref 135–145)
Total Bilirubin: 0.3 mg/dL (ref 0.3–1.2)
Total Protein: 7.1 g/dL (ref 6.5–8.1)

## 2023-04-11 LAB — TOTAL PROTEIN, URINE DIPSTICK: Protein, ur: 100 mg/dL — AB

## 2023-04-11 IMAGING — CT CT ABD-PELV W/ CM
2 of 5 series · 15 of 46 positions shown, 17 images · IV contrast (OMNIPAQUE 300)
Comparison: CT abdomen pelvis dated 12/21/2021.

CLINICAL DATA: Abdominal pain. History of uterine/cervical cancer
status post prior hysterectomy.

EXAM:
CT ABDOMEN AND PELVIS WITH CONTRAST
TECHNIQUE: Multidetector CT imaging of the abdomen and pelvis was performed
using the standard protocol following bolus administration of
intravenous contrast.

[Series 2: axial st · axial · 0.68mm/px · z∈[-521,-121]mm · 12 of 92 slices shown, 14 images]
[im 6/92  soft-tissue]
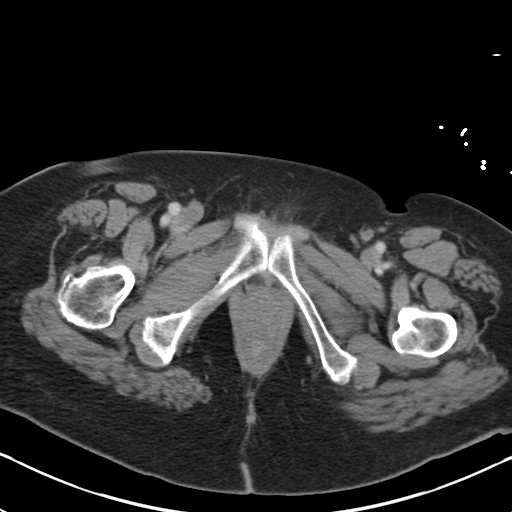
[im 6/92  bone]
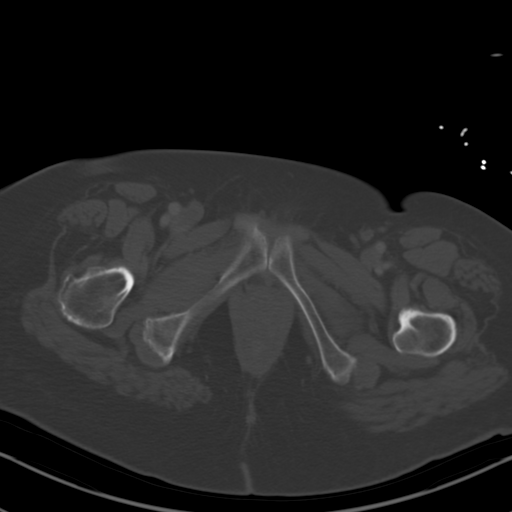
[im 17/92  soft-tissue]
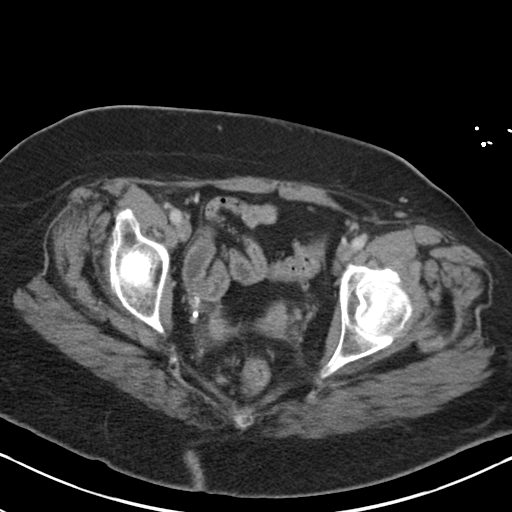
[im 22/92  soft-tissue]
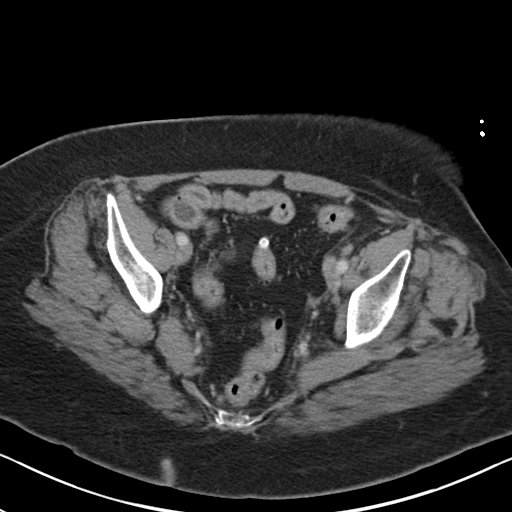
[im 27/92  soft-tissue]
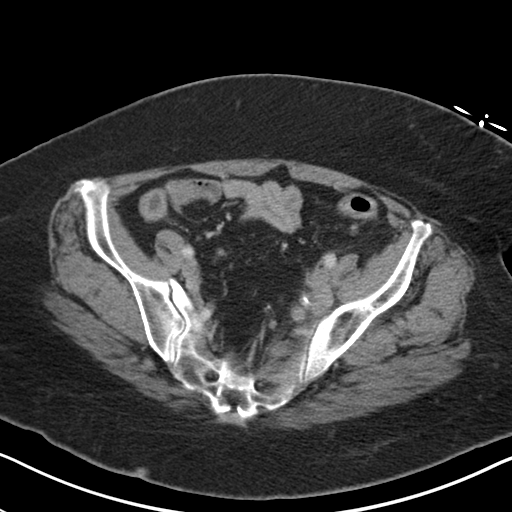
[im 38/92  soft-tissue]
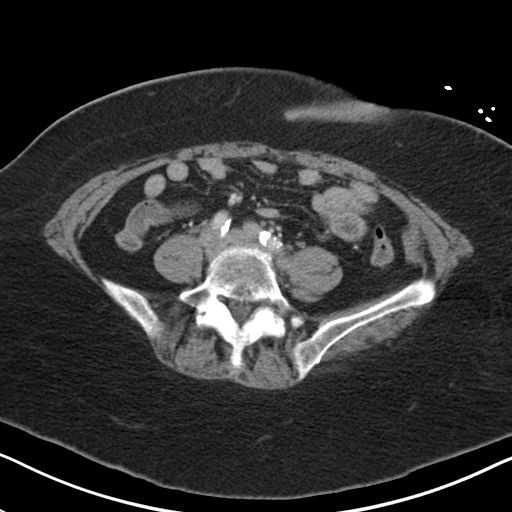
[im 43/92  soft-tissue]
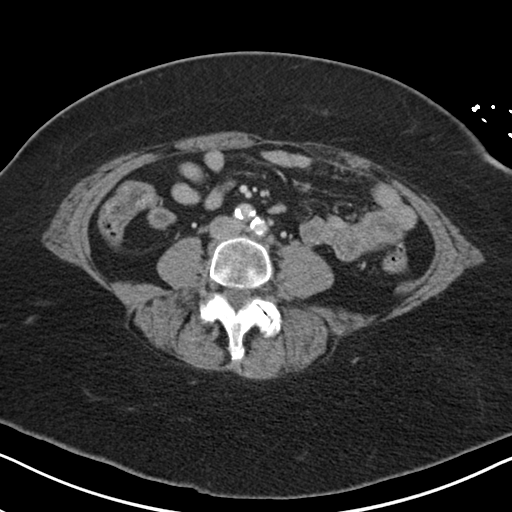
[im 49/92  soft-tissue]
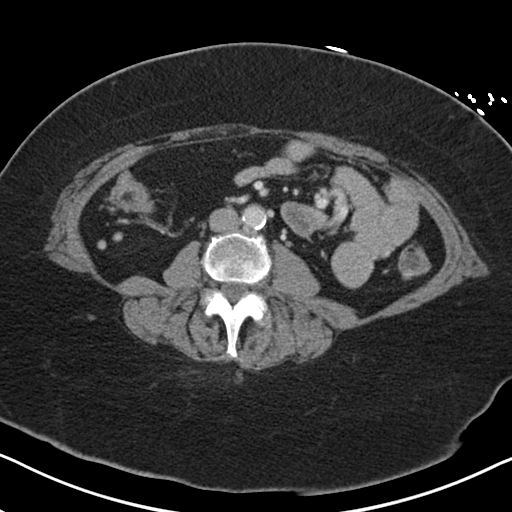
[im 59/92  soft-tissue]
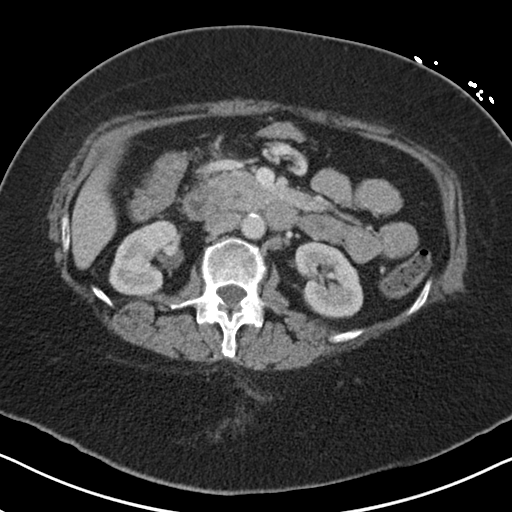
[im 65/92  soft-tissue]
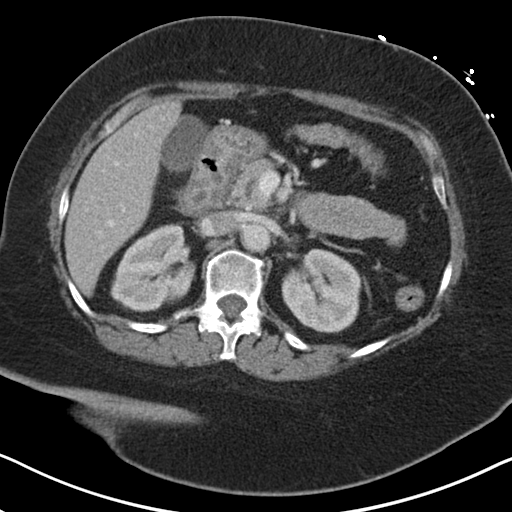
[im 65/92  bone]
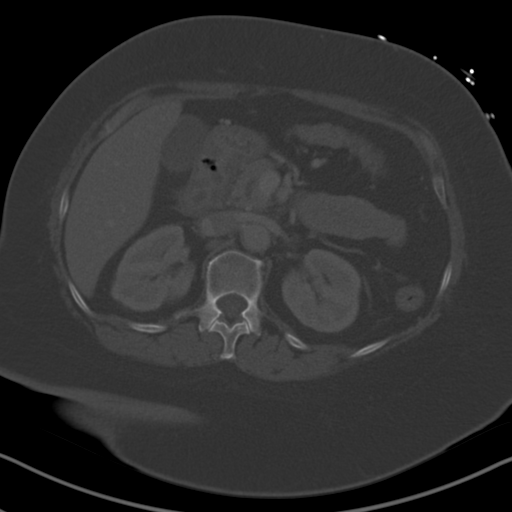
[im 70/92  soft-tissue]
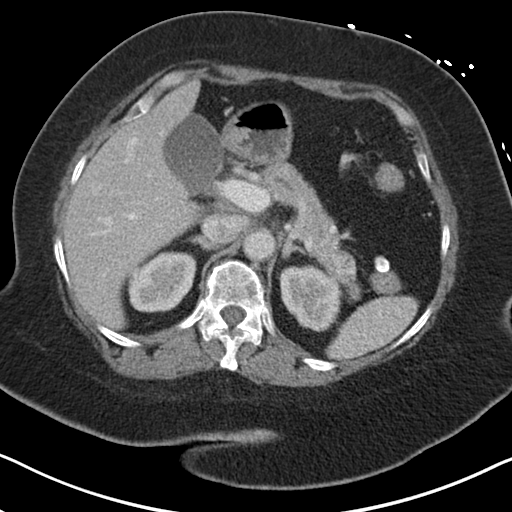
[im 81/92  soft-tissue]
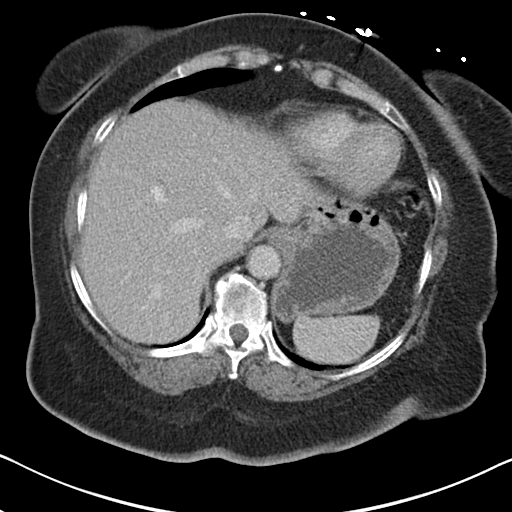
[im 86/92  soft-tissue]
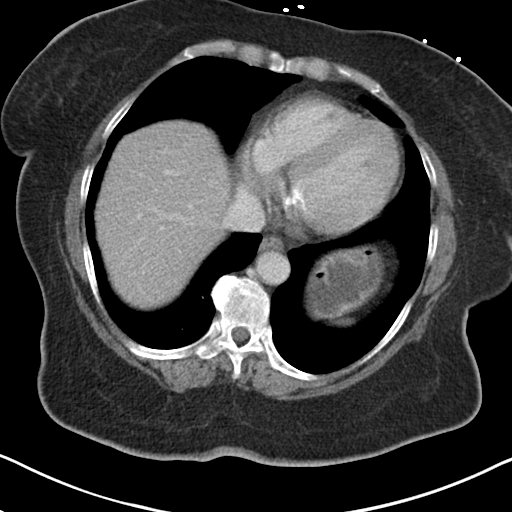

[Series 4: coronal st · coronal · 0.77mm/px · 3 of 151 slices shown]
[im 51/151  soft-tissue]
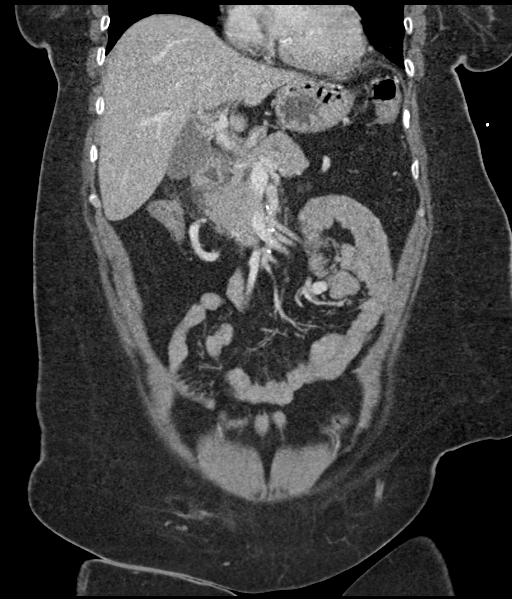
[im 67/151  soft-tissue]
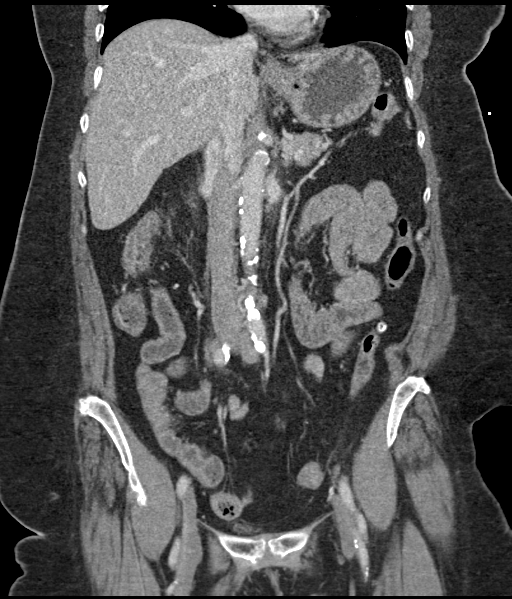
[im 84/151  soft-tissue]
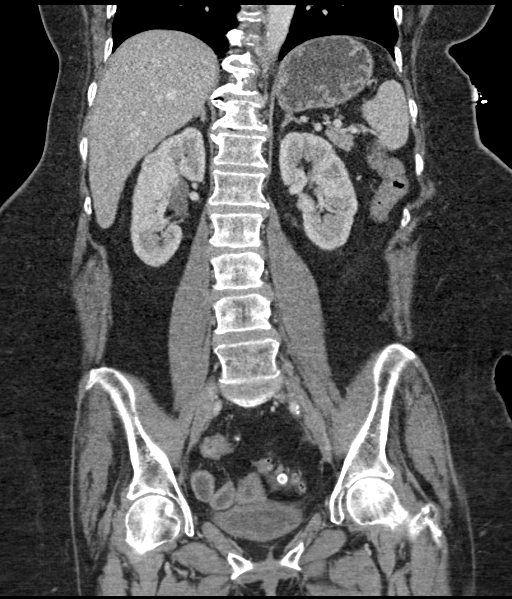

[15 of 46 positions shown; findings below may reference images not displayed]

RADIATION DOSE REDUCTION: This exam was performed according to the
departmental dose-optimization program which includes automated
exposure control, adjustment of the mA and/or kV according to
patient size and/or use of iterative reconstruction technique.

CONTRAST:  100mL OMNIPAQUE IOHEXOL 300 MG/ML  SOLN
FINDINGS: Lower chest: The visualized lung bases are clear. There is coronary
vascular calcification.

No intra-abdominal free air or free fluid.

Hepatobiliary: The liver is unremarkable. No intrahepatic biliary
dilatation. The gallbladder is unremarkable.

Pancreas: There is mild haziness adjacent to the head and uncinate
process of the pancreas, likely related to inflammatory changes of
the duodenum. Acute pancreatitis is not excluded correlation with
pancreatic enzymes recommended. No drainable fluid
collection/abscess or pseudocyst.

Spleen: Normal in size without focal abnormality.

Adrenals/Urinary Tract: The adrenal glands unremarkable. The
kidneys, visualized ureters, and urinary bladder appear
unremarkable.

Stomach/Bowel: There is sigmoid diverticulosis without active
inflammatory changes. There is inflammatory changes of the duodenal
C-loop which may represent duodenitis. There is no bowel
obstruction. The appendix is normal.

Vascular/Lymphatic: Moderate aortoiliac atherosclerotic disease. The
IVC is unremarkable. No portal venous gas. Slightly enlarged
appearance of a mildly enlarged left pelvic sidewall lymph node
measuring 14 mm short axis (66/2). Mildly enlarged lymph node medial
to the left psoas measures 12 mm short axis.

Reproductive: Hysterectomy. Grossly similar or slightly enlarged
ovoid mass in the left pelvis measuring 3.7 x 2.1 cm (previously
x 2.5 cm) associated with left vaginal fornix. Similar appearance of
the right vaginal fornix with soft tissue nodularity.

Several small mildly rounded perirectal lymph nodes again noted

Other: None

Musculoskeletal: Degenerative changes of the spine. No acute osseous
pathology.
IMPRESSION: 1. Findings may represent duodenitis versus pancreatitis. Clinical
correlation is recommended.
2. Sigmoid diverticulosis. No bowel obstruction. Normal appendix.
3. Slightly enlarged soft tissue masses associated with vaginal cuff
as well as increase in the size of the retroperitoneal adenopathy
compared to the prior CT. Findings concerning for recurrent tumor.
Clinical correlation is recommended.
4. Aortic Atherosclerosis ([id number]).

## 2023-04-11 MED ORDER — ACETAMINOPHEN 325 MG PO TABS
650.0000 mg | ORAL_TABLET | Freq: Once | ORAL | Status: AC
  Administered 2023-04-11: 650 mg via ORAL
  Filled 2023-04-11: qty 2

## 2023-04-11 MED ORDER — SODIUM CHLORIDE 0.9 % IV SOLN
200.0000 mg | Freq: Once | INTRAVENOUS | Status: AC
  Administered 2023-04-11: 200 mg via INTRAVENOUS
  Filled 2023-04-11: qty 200

## 2023-04-11 MED ORDER — SODIUM CHLORIDE 0.9% FLUSH
10.0000 mL | INTRAVENOUS | Status: DC | PRN
  Administered 2023-04-11: 10 mL

## 2023-04-11 MED ORDER — SODIUM CHLORIDE 0.9 % IV SOLN: Freq: Once | INTRAVENOUS | Status: AC

## 2023-04-11 MED ORDER — SODIUM CHLORIDE 0.9% FLUSH
10.0000 mL | Freq: Once | INTRAVENOUS | Status: AC
  Administered 2023-04-11: 10 mL

## 2023-04-11 MED ORDER — HEPARIN SOD (PORK) LOCK FLUSH 100 UNIT/ML IV SOLN
500.0000 [IU] | Freq: Once | INTRAVENOUS | Status: AC | PRN
  Administered 2023-04-11: 500 [IU]

## 2023-04-11 NOTE — Assessment & Plan Note (Signed)
She has chronic persistent proteinuria which I suspect is due to uncontrolled diabetes Her blood pressure is better controlled We will proceed with Lenvima without delay I encouraged the patient to modify her diet as best as she can

## 2023-04-11 NOTE — Assessment & Plan Note (Signed)
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

## 2023-04-11 NOTE — Progress Notes (Signed)
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[facility]) Her last imaging study showed positive response to therapy She has persistent disease in the vaginal cuff but overall I do not think it is worse We discussed expectant management with supportive care only She is not a candidate for further surgery or radiation I plan to repeat imaging study again in 3 months around July We discussed importance of lifestyle changes and risk factor modifications  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   All questions were answered. The patient knows to call the clinic with any problems, questions or concerns. The total time spent in the appointment was 25 minutes encounter with patients including review of chart and various tests results, discussions about plan of care and coordination of care plan   Artis Delay, MD 04/11/2023 11:33 AM  INTERVAL HISTORY: Please see below for problem oriented charting. she returns for treatment follow-up on combination treatment of pembrolizumab and Lenvima She is doing well Denies excessive vaginal bleeding According to the patient, her documented blood pressure from home was within normal range  REVIEW OF SYSTEMS:   Constitutional: Denies fevers, chills or abnormal weight loss Eyes: Denies blurriness of vision Ears, nose, mouth, throat, and face: Denies mucositis or sore throat Respiratory: Denies cough, dyspnea or wheezes Cardiovascular: Denies palpitation, chest  discomfort or lower extremity swelling Gastrointestinal:  Denies nausea, heartburn or change in bowel habits Skin: Denies abnormal skin rashes Lymphatics: Denies new lymphadenopathy or easy bruising Neurological:Denies numbness, tingling or new weaknesses Behavioral/Psych: Mood is stable, no new changes  All other systems were reviewed with the patient and are negative.  I have reviewed the past medical history, past surgical history, social history and family history with the patient and they are unchanged from previous note.  ALLERGIES:  has No Known Allergies.  MEDICATIONS:  Current Outpatient Medications  Medication Sig Dispense Refill   amLODipine (NORVASC) 10 MG tablet Take 1 tablet (10 mg total) by mouth daily. 90 tablet 1   aspirin 81 MG EC tablet Take 1 tablet (81 mg total) by mouth daily. 30 tablet 12   atorvastatin (LIPITOR) 80 MG tablet Take 1 tablet (80 mg total) by mouth at bedtime. 90 tablet 1   empagliflozin (JARDIANCE) 10 MG TABS tablet Take 1 tablet (10 mg total) by mouth daily before breakfast. 90 tablet 1   gabapentin (NEURONTIN) 300 MG capsule Take 1 capsule (300 mg total) by mouth at bedtime. 90 capsule 1   glimepiride (AMARYL) 4 MG tablet Take 1 tablet (4 mg total) by mouth daily with breakfast. 90 tablet 1   isosorbide mononitrate (IMDUR) 30 MG 24 hr tablet Take 2 tablets (60 mg total) by mouth daily. 180 tablet 1   lenvatinib 10 mg daily dose (LENVIMA, 10 MG DAILY DOSE,) capsule Take 1 capsule (10 mg total) by mouth daily. 30 capsule 11   lidocaine-prilocaine (EMLA) cream Apply 1 Application topically as needed. 30 g 0   lisinopril (ZESTRIL) 10 MG tablet Take 3 tablets (30 mg total) by mouth  daily. 270 tablet 1   metFORMIN (GLUCOPHAGE) 500 MG tablet Take 2 tablets (1,000 mg total) by mouth 2 (two) times daily with a meal. 360 tablet 1   metoprolol tartrate (LOPRESSOR) 100 MG tablet Take 1 tablet (100 mg total) by mouth 2 (two) times daily. 180 tablet 1    nitroGLYCERIN (NITROSTAT) 0.4 MG SL tablet Place 1 tablet (0.4 mg total) under the tongue every 5 (five) minutes as needed for chest pain. 25 tablet 2   terbinafine (LAMISIL) 250 MG tablet Take 1 tablet (250 mg total) by mouth daily. 90 tablet 0   tirzepatide (MOUNJARO) 10 MG/0.5ML Pen Inject 10 mg into the skin once a week. 6 mL 6   No current facility-administered medications for this visit.   Facility-Administered Medications Ordered in Other Visits  Medication Dose Route Frequency Provider Last Rate Last Admin   sodium chloride flush (NS) 0.9 % injection 10 mL  10 mL Intracatheter PRN Bertis Ruddy, Kenyetta Fife, MD   10 mL at 04/11/23 1054    SUMMARY OF ONCOLOGIC HISTORY: Oncology History Overview Note  Pap 12/14/20: adenocarcinoma, HPV negative  HER-2 positive by FISH Her-2 equivocal by [facility]  MSI stable    Papillary serous adenocarcinoma of endometrium (HCC)   Initial Diagnosis   Endometrial carcinoma (HCC)   01/12/2021 Initial Biopsy   A. ENDOCERVIX, CURETTAGE:  - Adenocarcinoma.  B. ENDOMETRIUM, BIOPSY:  - Adenocarcinoma.  COMMENT:  The differential diagnosis includes high grade serous carcinoma.  Both  specimens have a similar morphology; high grade serous carcinoma more  commonly arises in the endometrium.  Results reported to Dr. Valentina Shaggy  on 01/15/2021.  Dr. Luisa Hart reviewed the case.    01/16/2021 Tumor Marker   Patient's tumor was tested for the following markers: CA-125 Results of the tumor marker test revealed normal value, 10.7   01/25/2021 Imaging   CT C/A/P: 1. Small volume fluid in the endometrial canal with 7 mm hypoattenuating lesion in the myometrium of the anterior fundus. 2. No definite evidence for metastatic disease in the chest, abdomen, or pelvis. Small lymph nodes are noted along both pelvic sidewalls. Attention on follow-up recommended. 3. 5 mm ground-glass opacity peripheral left upper lobe. This is likely related to infection/inflammation and potentially  scar. Attention on surveillance imaging recommended. 4. 5.8 cm lesion posterior lower uterine segment likely a fibroid. Ultrasound exam from 01/28/2010 demonstrated a 5.2 cm lesion in the left aspect of the posterior lower uterine body. 5. Aortic Atherosclerosis (ICD10-I70.0).   01/29/2021 Surgery   TRH/BSO, SLN biopsy left, selective pelvic LND right, omentectomy  Findings: On EUA, 10-12cm enlarged mobile uterus. On intra-abdomina entry, minimal adhesions between the liver and anterior abdomen on the right. Some changes c/w fatty liver on the left. Omentum, stomach, small and large bowel all grossly normal. Bilateral ovaries normal appearing. Uterus with 5-6cm posterior fibroid, otherwise normal appearing. No mapping to right pelvis. On left, mapping to the level of the superior vessel artery, enlarged lymph node noted (likely sentinel) within the upper aspect of the obturator space. In bilateral pelvic basins, multiple enlarged lymph nodes. On the right, enlarged lymph nodes extended superiorly along the common iliac vessels. At the end of surgery, no obvious abdominal or pelvic evidence of disease.   01/29/2021 Pathology Results   A. SENTINEL LYMPH NODE, LEFT INTERNAL ILIAC, BIOPSY:  - Metastatic carcinoma in (1) of (1) lymph node.   B. SENTINEL LYMPH NODE, LEFT OBTURATOR, BIOPSY:  - Metastatic carcinoma in (1) of (1) lymph node.  C. LYMPH NODES, LEFT PELVIC, DISSECTION:  - Metastatic carcinoma in (1) of (3) lymph nodes.   D. LYMPH NODE, RIGHT EXTERNAL AND COMMON ILIAC, BIOPSY:  - Metastatic carcinoma in (1) of (1) lymph node.   E. UTERUS, CERVIX AND BILATERAL FALLOPIAN TUBES AND OVARIES, TOTAL  HYSTERECTOMY AND BILATERAL SALPINGO-OOPHORECTOMY:  - High grade serous carcinoma of endometrium, with invasion more than  half of the myometrium.  - Tumor invades the stromal connective tissue of the cervix.  - No involvement of uterine serosa or adnexa.  - Lymphovascular invasion is  identified.  - See oncology table.   F. OMENTUM, OMENTECTOMY:  - Omentum, negative for carcinoma.   G. LYMPH NODES, RIGHT PELVIC, DISSECTION:  - Two lymph nodes, negative for carcinoma (0/2).   ONCOLOGY TABLE:   UTERUS, CARCINOMA OR CARCINOSARCOMA: Resection   Procedure: Total hysterectomy and bilateral salpingo-oophorectomy,  Omentectomy, Lymph node sampling  Histologic Type: Serous carcinoma  Histologic Grade: High grade  Myometrial Invasion: > 50%  Uterine Serosa Involvement: Not identified  Cervical Stroma Involvement: Present  Other Tissue/Organ Involvement: Not identified  Peritoneal/Ascitic Fluid: Negative for carcinoma  Lymphovascular Invasion: Present  Regional Lymph Nodes:       Pelvic Lymph Nodes Examined:            2 Sentinel            6 Non-Sentinel            8 Total       Pelvic Lymph Nodes with Metastasis: 4            Macrometastasis: 3            Micrometastasis: 1            Isolated Tumor Cells: 0            Laterality of Lymph Nodes with Tumor: Right (non-sentinel),  Left (sentinel and non-sentinel)            Extracapsular Extension: Present       Para-Aortic Lymph Nodes Examined: Not applicable        Para-Aortic Lymph Nodes with Metastasis: Not applicable  Distant Metastasis:       Distant Site(s) Involved: Omentum: Not involved  Pathologic Stage Classification (pTNM, AJCC 8th Edition): pT2, pN1a  Ancillary Studies: HER2 will be ordered  Additional Findings: Leiomyomata.  Adenomyosis.  Representative Tumor Block: B1  Comment: Dr. Luisa Hart reviewed select slides.  (v4.2.0.1)    01/29/2021 Cancer Staging   Staging form: Corpus Uteri - Carcinoma and Carcinosarcoma, AJCC 8th Edition - Clinical stage from 01/29/2021: FIGO Stage IIIC1 (cT1b, cN1a, cM0) - Signed by Carver Fila, MD on 02/02/2021 Histopathologic type: Mixed cell adenocarcinoma Stage prefix: Initial diagnosis Method of lymph node assessment: Other Histologic grade (G):  G3 Histologic grading system: 3 grade system Lymph-vascular invasion (LVI): LVI present/identified, NOS Peritoneal cytology results: Negative Pelvic nodal status: Positive Number of pelvic nodes positive from dissection: 4 Number of pelvic nodes examined during dissection: 8 Para-aortic status: Not assessed Lymph node metastasis: Present Omentectomy performed: Yes Morcellation performed: No   02/19/2021 Echocardiogram    1. Left ventricular ejection fraction, by estimation, is 55 to 60%. The left ventricle has normal function. The left ventricle demonstrates regional wall motion abnormalities (see scoring diagram/findings for description). There is mild left ventricular  hypertrophy. Left ventricular diastolic parameters are consistent with Grade I diastolic dysfunction (impaired relaxation). There is moderate hypokinesis of the left ventricular, basal septal wall and inferior wall. The  average left ventricular global longitudinal strain is -17.9 %. The global longitudinal strain is abnormal.  2. Right ventricular systolic function is normal. The right ventricular size is normal. There is normal pulmonary artery systolic pressure. The estimated right ventricular systolic pressure is 21.7 mmHg.  3. The mitral valve is abnormal. Trivial mitral valve regurgitation.  4. The aortic valve is tricuspid. Aortic valve regurgitation is not visualized.  5. The inferior vena cava is normal in size with greater than 50% respiratory variability, suggesting right atrial pressure of 3 mmHg.   02/20/2021 Procedure   Successful placement of a right IJ approach Power Port with ultrasound and fluoroscopic guidance. The catheter is ready for use.   02/28/2021 - 06/14/2021 Chemotherapy    Patient is on Treatment Plan: UTERINE CARBOPLATIN AUC 6 / PACLITAXEL Q21D       04/20/2021 Imaging   Status post hysterectomy and suspected bilateral salpingo-oophorectomy.   2.2 cm fluid density lesion along the left pelvic  sidewall likely reflects a postoperative seroma.   No findings suspicious for recurrent or metastatic disease.   07/16/2021 - 08/16/2021 Radiation Therapy   Radiation Treatment Dates: 07/16/2021 through 08/16/2021 Site Technique Total Dose (Gy) Dose per Fx (Gy) Completed Fx Beam Energies  Vagina: Pelvis HDR-brachy 30/30 6 5/5 Ir-192        10/01/2021 Imaging   Increasing size of LEFT pelvic lymph nodes, largest approximately 11 mm along the external iliac chain. Given findings and history could consider PET for further evaluation as warranted.   Cystic area along the LEFT pelvic sidewall that was seen previously has improved.   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   Aortic Atherosclerosis (ICD10-I70.0).   12/21/2021 Imaging   1. Enlarging soft tissue masses involving the vaginal cuff. 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 Speculum examination and re-biopsy may be confirmatory. PET-CT may be helpful. 2. New small sub 5 mm perirectal and sigmoid mesocolon nodes. 3. Slight progression of pelvic adenopathy as detailed above. 4. No findings suspicious for abdominal metastatic disease or osseous metastatic disease. 5. Stable age advanced atherosclerotic calcifications involving the aorta and branch vessels and coronary arteries   12/25/2021 Echocardiogram    1. Global longitudinal strain is -16.1% Mild basal inferior hypoinesis. Compared to echo from 02/2021, no change in overall LVEF /regional wall motion. Global longitudinal strain is mildly less negative (-17.9% to -16.1%. Left ventricular ejection fraction, by estimation, is 65 to 70%. The left ventricle has normal function. The left ventricle has no regional wall motion abnormalities. There is mild left ventricular hypertrophy. Left ventricular diastolic parameters are indeterminate.  2. Right ventricular systolic  function is normal. The right ventricular size is normal. There is normal pulmonary artery systolic pressure.  3. Trivial mitral valve regurgitation.  4. The aortic valve is normal in structure. Aortic valve regurgitation is not visualized.  5. The inferior vena cava is normal in size with greater than 50% respiratory variability, suggesting right atrial pressure of 3 mmHg.     01/01/2022 - 01/22/2022 Chemotherapy   Patient is on Treatment Plan : UTERINE SEROUS CARCINOMA Carboplatin + Paclitaxel + Trastuzumab q21d x 6 Cycles / Trastuzumab q21d     03/29/2022 Pathology Results   A.   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   COMMENT:  The carcinoma has diffuse strong Pax8 immunoreactivity (Mullerian marker) and abnormal overexpression of nuclear p53 and relatively diffuse strong p16 staining.  Estrogen receptor (ER) is weakly reactive  and there is focal strong Napsin A immunoreactivity.  The immunophenotype is most characteristic of serous carcinoma.   The history of prior treatment for endometrial serous carcinoma is noted.   04/12/2022 Imaging   1. Surgical change of prior hysterectomy with increased nodular thickening at the vaginal cuff, suspicious for recurrent disease. Consider further evaluation with PET-CT versus direct tissue sampling. 2. Unchanged prominent/mildly enlarged retroperitoneal and pelvic lymph nodes, nonspecific but in the setting of disease recurrence these would be at least somewhat suspicious for nodal involvement. Consider attention on follow-up imaging. 3. No evidence of metastatic disease within the chest. 4. Left-sided colonic diverticulosis without findings of acute diverticulitis. 5.  Aortic Atherosclerosis (ICD10-I70.0).   04/16/2022 - 08/09/2022 Chemotherapy   Patient is on Treatment Plan : UTERINE Lenvatinib (20) D1-21 +  Pembrolizumab (200) D1 q21d     04/16/2022 -  Chemotherapy   Patient is on Treatment Plan : UTERINE Lenvatinib (20) D1-21 + Pembrolizumab (200) D1 q21d     07/09/2022 Imaging   Mild increase in mild left para-aortic and left iliac lymphadenopathy, consistent with metastatic lymphadenopathy.   Stable nodular thickening of vaginal cuff, consistent with vaginal recurrence.   Colonic diverticulosis, without radiographic evidence of diverticulitis.   Probable tiny calcified gallstone. No radiographic evidence of cholecystitis.   Aortic Atherosclerosis (ICD10-I70.0).     09/12/2022 Imaging   1. Mild decrease in thickening along the vaginal cuff. 2. LEFT iliac and LEFT periaortic retroperitoneal adenopathy is stable to slightly decreased. 3. No evidence of new lymphadenopathy or progressive metastatic uterine carcinoma.      12/19/2022 Imaging   Persistent thickening of the walls of the vaginal cuff with some luminal fluid and air. The focal nodular component measured previously is less well-defined today relative to the adjacent wall thickening.   Persistent lymph node enlargement in the retroperitoneum and left pelvic sidewall. Some areas are similar and others are slightly increased. No new areas of lymph node enlargement.    Gallstones.     03/21/2023 Imaging   1. Persistent mass-like thickening of the vagina, with increasing nodular component in the right side of the vaginal apex concerning for progressive disease. 2. Previously noted lymphadenopathy in the pelvis and retroperitoneum appears relatively similar to the prior study. 3. No new extranodal sites of metastatic disease noted in the abdomen or pelvis. 4. Trace volume of ascites in the low anatomic pelvis.  5. Colonic diverticulosis without evidence of acute diverticulitis at this time. 6. Cholelithiasis without evidence of acute cholecystitis. 7. Aortic atherosclerosis, in addition to at least 2 vessel coronary artery disease.  Please note that although the presence of coronary artery calcium documents the presence of coronary artery disease, the severity of this disease and any potential stenosis cannot be assessed on this non-gated CT examination. Assessment for potential risk factor modification, dietary therapy or pharmacologic therapy may be warranted, if clinically indicated.     PHYSICAL EXAMINATION: ECOG PERFORMANCE STATUS: 0 - Asymptomatic  Vitals:   04/11/23 0856  BP: 133/67  Pulse: 76  Resp: 18  Temp: 98.1 F (36.7 C)  SpO2: 100%   Filed Weights   04/11/23 0856  Weight: 202 lb 12.8 oz (92 kg)    GENERAL:alert, no distress and comfortable  NEURO: alert & oriented x 3 with fluent speech, no focal motor/sensory deficits  LABORATORY  DATA:  I have reviewed the data as listed    Component Value Date/Time   NA 139 04/11/2023 0839   NA 143 10/18/2020 1028   K 4.3 04/11/2023 0839   CL 108 04/11/2023 0839   CO2 24 04/11/2023 0839   GLUCOSE 93 04/11/2023 0839   BUN 30 (H) 04/11/2023 0839   BUN 13 10/18/2020 1028   CREATININE 1.17 (H) 04/11/2023 0839   CREATININE 0.79 02/11/2017 0943   CALCIUM 9.7 04/11/2023 0839   PROT 7.1 04/11/2023 0839   PROT 6.7 10/18/2020 1028   ALBUMIN 3.7 04/11/2023 0839   ALBUMIN 4.1 10/18/2020 1028   AST 11 (L) 04/11/2023 0839   ALT 9 04/11/2023 0839   ALKPHOS 98 04/11/2023 0839   BILITOT 0.3 04/11/2023 0839   GFRNONAA 52 (L) 04/11/2023 0839   GFRNONAA 83 02/11/2017 0943   GFRAA 70 10/18/2020 1028   GFRAA >89 02/11/2017 0943    No results found for: "SPEP", "UPEP"  Lab Results  Component Value Date   WBC 6.0 04/11/2023   NEUTROABS 3.6 04/11/2023   HGB 11.6 (L) 04/11/2023   HCT 35.6 (L) 04/11/2023   MCV 90.6 04/11/2023   PLT 304 04/11/2023      Chemistry      Component Value Date/Time   NA 139 04/11/2023 0839   NA 143 10/18/2020 1028   K 4.3 04/11/2023 0839   CL 108 04/11/2023 0839   CO2 24 04/11/2023 0839   BUN 30 (H) 04/11/2023 0839   BUN  13 10/18/2020 1028   CREATININE 1.17 (H) 04/11/2023 0839   CREATININE 0.79 02/11/2017 0943      Component Value Date/Time   CALCIUM 9.7 04/11/2023 0839   ALKPHOS 98 04/11/2023 0839   AST 11 (L) 04/11/2023 0839   ALT 9 04/11/2023 0839   BILITOT 0.3 04/11/2023 0839

## 2023-04-11 NOTE — Patient Instructions (Signed)
[facility]  Discharge Instructions: Thank you for choosing [facility] to provide your oncology and hematology care.   If you have a lab appointment with the Cancer Center, please go directly to the Cancer Center and check in at the registration area.   Wear comfortable clothing and clothing appropriate for easy access to any Portacath or PICC line.   We strive to give you quality time with your provider. You may need to reschedule your appointment if you arrive late (15 or more minutes).  Arriving late affects you and other patients whose appointments are after yours.  Also, if you miss three or more appointments without notifying the office, you may be dismissed from the clinic at the provider's discretion.      For prescription refill requests, have your pharmacy contact our office and allow 72 hours for refills to be completed.    Today you received the following chemotherapy and/or immunotherapy agents keytruda      To help prevent nausea and vomiting after your treatment, we encourage you to take your nausea medication as directed.  BELOW ARE SYMPTOMS THAT SHOULD BE REPORTED IMMEDIATELY: *FEVER GREATER THAN 100.4 F (38 C) OR HIGHER *CHILLS OR SWEATING *NAUSEA AND VOMITING THAT IS NOT CONTROLLED WITH YOUR NAUSEA MEDICATION *UNUSUAL SHORTNESS OF BREATH *UNUSUAL BRUISING OR BLEEDING *URINARY PROBLEMS (pain or burning when urinating, or frequent urination) *BOWEL PROBLEMS (unusual diarrhea, constipation, pain near the anus) TENDERNESS IN MOUTH AND THROAT WITH OR WITHOUT PRESENCE OF ULCERS (sore throat, sores in mouth, or a toothache) UNUSUAL RASH, SWELLING OR PAIN  UNUSUAL VAGINAL DISCHARGE OR ITCHING   Items with * indicate a potential emergency and should be followed up as soon as possible or go to the Emergency Department if any problems should occur.  Please show the CHEMOTHERAPY ALERT CARD or IMMUNOTHERAPY ALERT CARD at  check-in to the Emergency Department and triage nurse.  Should you have questions after your visit or need to cancel or reschedule your appointment, please contact [facility]  Dept: [phone]  and follow the prompts.  Office hours are 8:00 a.m. to 4:30 p.m. Monday - Friday. Please note that voicemails left after 4:00 p.m. may not be returned until the following business day.  We are closed weekends and major holidays. You have access to a nurse at all times for urgent questions. Please call the main number to the clinic Dept: [phone] and follow the prompts.   For any non-urgent questions, you may also contact your provider using MyChart. We now offer e-Visits for anyone 18 and older to request care online for non-urgent symptoms. For details visit [email or web address].   Also download the MyChart app! Go to the app store, search "MyChart", open the app, select [place], and log in with your MyChart username and password.   

## 2023-04-14 ENCOUNTER — Other Ambulatory Visit: Payer: Self-pay

## 2023-04-24 ENCOUNTER — Other Ambulatory Visit: Payer: Self-pay

## 2023-04-24 ENCOUNTER — Other Ambulatory Visit (HOSPITAL_COMMUNITY): Payer: Self-pay

## 2023-05-02 ENCOUNTER — Encounter: Payer: Self-pay | Admitting: Hematology and Oncology

## 2023-05-02 ENCOUNTER — Inpatient Hospital Stay (HOSPITAL_BASED_OUTPATIENT_CLINIC_OR_DEPARTMENT_OTHER): Payer: Medicare Other | Admitting: Hematology and Oncology

## 2023-05-02 ENCOUNTER — Inpatient Hospital Stay: Payer: Medicare Other

## 2023-05-02 ENCOUNTER — Inpatient Hospital Stay: Payer: Medicare Other | Attending: Gynecologic Oncology

## 2023-05-02 VITALS — BP 150/86 | HR 78 | Temp 97.4°F | Resp 18 | Ht 62.0 in | Wt 194.2 lb

## 2023-05-02 DIAGNOSIS — Z7984 Long term (current) use of oral hypoglycemic drugs: Secondary | ICD-10-CM | POA: Insufficient documentation

## 2023-05-02 DIAGNOSIS — I10 Essential (primary) hypertension: Secondary | ICD-10-CM

## 2023-05-02 DIAGNOSIS — C541 Malignant neoplasm of endometrium: Secondary | ICD-10-CM

## 2023-05-02 DIAGNOSIS — I700 Atherosclerosis of aorta: Secondary | ICD-10-CM | POA: Insufficient documentation

## 2023-05-02 DIAGNOSIS — R590 Localized enlarged lymph nodes: Secondary | ICD-10-CM | POA: Diagnosis not present

## 2023-05-02 DIAGNOSIS — Z7982 Long term (current) use of aspirin: Secondary | ICD-10-CM | POA: Diagnosis not present

## 2023-05-02 DIAGNOSIS — Z79899 Other long term (current) drug therapy: Secondary | ICD-10-CM | POA: Diagnosis not present

## 2023-05-02 DIAGNOSIS — Z5112 Encounter for antineoplastic immunotherapy: Secondary | ICD-10-CM | POA: Insufficient documentation

## 2023-05-02 LAB — CBC WITH DIFFERENTIAL (CANCER CENTER ONLY)
Abs Immature Granulocytes: 0.02 10*3/uL (ref 0.00–0.07)
Basophils Absolute: 0 10*3/uL (ref 0.0–0.1)
Basophils Relative: 0 %
Eosinophils Absolute: 0.1 10*3/uL (ref 0.0–0.5)
Eosinophils Relative: 1 %
HCT: 36.8 % (ref 36.0–46.0)
Hemoglobin: 12.1 g/dL (ref 12.0–15.0)
Immature Granulocytes: 0 %
Lymphocytes Relative: 35 %
Lymphs Abs: 1.9 10*3/uL (ref 0.7–4.0)
MCH: 29.6 pg (ref 26.0–34.0)
MCHC: 32.9 g/dL (ref 30.0–36.0)
MCV: 90 fL (ref 80.0–100.0)
Monocytes Absolute: 0.5 10*3/uL (ref 0.1–1.0)
Monocytes Relative: 8 %
Neutro Abs: 3 10*3/uL (ref 1.7–7.7)
Neutrophils Relative %: 56 %
Platelet Count: 336 10*3/uL (ref 150–400)
RBC: 4.09 MIL/uL (ref 3.87–5.11)
RDW: 13.8 % (ref 11.5–15.5)
WBC Count: 5.5 10*3/uL (ref 4.0–10.5)
nRBC: 0 % (ref 0.0–0.2)

## 2023-05-02 LAB — CMP (CANCER CENTER ONLY)
ALT: 12 U/L (ref 0–44)
AST: 16 U/L (ref 15–41)
Albumin: 3.7 g/dL (ref 3.5–5.0)
Alkaline Phosphatase: 81 U/L (ref 38–126)
Anion gap: 8 (ref 5–15)
BUN: 14 mg/dL (ref 8–23)
CO2: 26 mmol/L (ref 22–32)
Calcium: 9.5 mg/dL (ref 8.9–10.3)
Chloride: 104 mmol/L (ref 98–111)
Creatinine: 1.03 mg/dL — ABNORMAL HIGH (ref 0.44–1.00)
GFR, Estimated: >60 mL/min (ref >60)
Glucose, Bld: 135 mg/dL — ABNORMAL HIGH (ref 70–99)
Potassium: 3.9 mmol/L (ref 3.5–5.1)
Sodium: 138 mmol/L (ref 135–145)
Total Bilirubin: 0.4 mg/dL (ref 0.3–1.2)
Total Protein: 7.2 g/dL (ref 6.5–8.1)

## 2023-05-02 LAB — TSH: TSH: 1.924 u[IU]/mL (ref 0.350–4.500)

## 2023-05-02 IMAGING — DX DG CHEST 1V PORT
1 series · 1 of 1 positions shown · non-contrast
Comparison: 01/04/2022 chest radiograph.

CLINICAL DATA: Dyspnea, uterine cancer on chemotherapy

EXAM:
PORTABLE CHEST 1 VIEW

[chest ap]
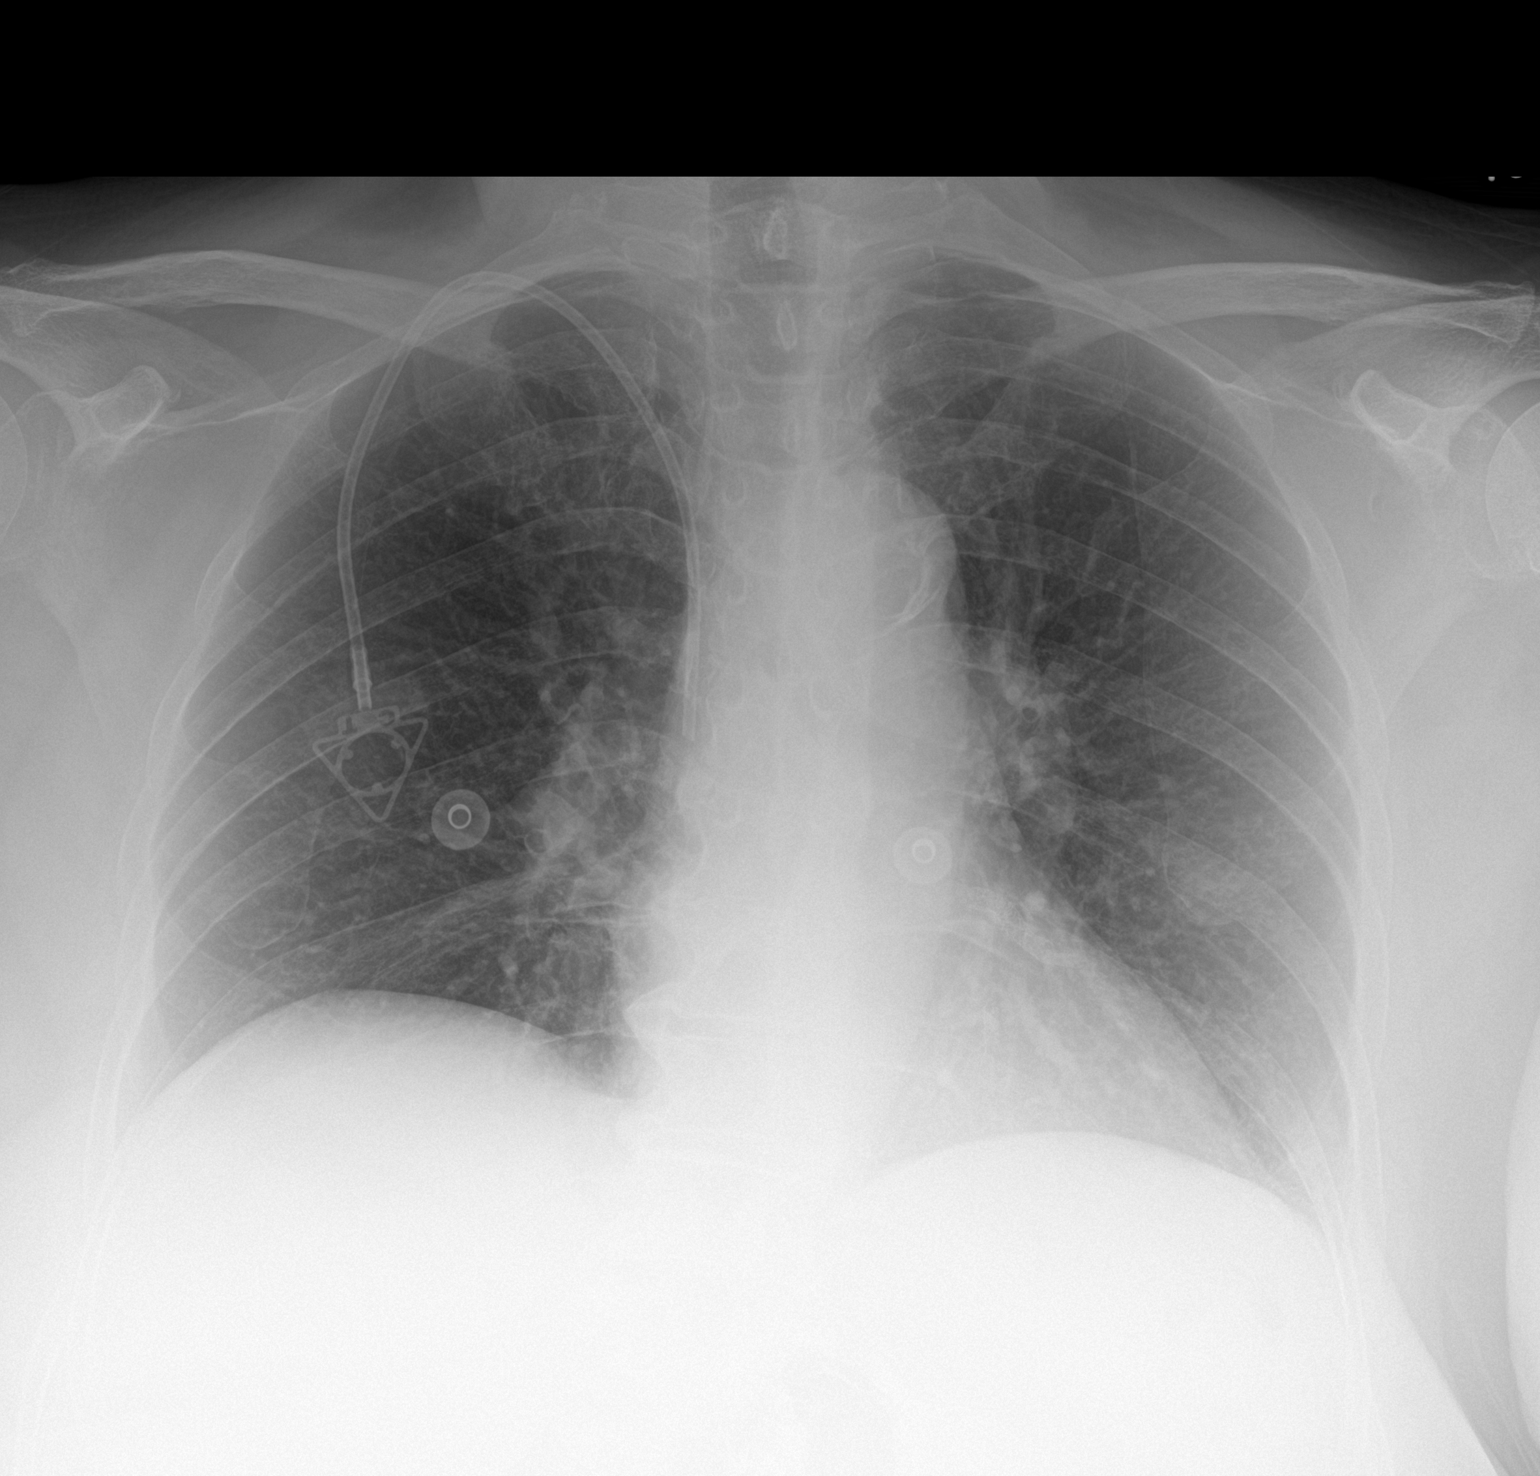

[1 of 1 positions shown; findings below may reference images not displayed]

FINDINGS: Right internal jugular Port-A-Cath terminates in the lower third of
the SVC. Stable cardiomediastinal silhouette with normal heart size.
No pneumothorax. No pleural effusion. Lungs appear clear, with no
acute consolidative airspace disease and no pulmonary edema.
IMPRESSION: No active disease.

## 2023-05-02 MED ORDER — SODIUM CHLORIDE 0.9 % IV SOLN
200.0000 mg | Freq: Once | INTRAVENOUS | Status: AC
  Administered 2023-05-02: 200 mg via INTRAVENOUS
  Filled 2023-05-02: qty 200

## 2023-05-02 MED ORDER — ACETAMINOPHEN 325 MG PO TABS
650.0000 mg | ORAL_TABLET | Freq: Once | ORAL | Status: AC
  Administered 2023-05-02: 650 mg via ORAL
  Filled 2023-05-02: qty 2

## 2023-05-02 MED ORDER — SODIUM CHLORIDE 0.9 % IV SOLN: Freq: Once | INTRAVENOUS | Status: AC

## 2023-05-02 MED ORDER — SODIUM CHLORIDE 0.9% FLUSH
10.0000 mL | Freq: Once | INTRAVENOUS | Status: AC
  Administered 2023-05-02: 10 mL

## 2023-05-02 MED ORDER — SODIUM CHLORIDE 0.9% FLUSH
10.0000 mL | INTRAVENOUS | Status: DC | PRN
  Administered 2023-05-02: 10 mL

## 2023-05-02 MED ORDER — HEPARIN SOD (PORK) LOCK FLUSH 100 UNIT/ML IV SOLN
500.0000 [IU] | Freq: Once | INTRAVENOUS | Status: AC | PRN
  Administered 2023-05-02: 500 [IU]

## 2023-05-02 NOTE — Patient Instructions (Signed)
[facility]  Discharge Instructions: Thank you for choosing [facility] to provide your oncology and hematology care.   If you have a lab appointment with the Cancer Center, please go directly to the Cancer Center and check in at the registration area.   Wear comfortable clothing and clothing appropriate for easy access to any Portacath or PICC line.   We strive to give you quality time with your provider. You may need to reschedule your appointment if you arrive late (15 or more minutes).  Arriving late affects you and other patients whose appointments are after yours.  Also, if you miss three or more appointments without notifying the office, you may be dismissed from the clinic at the provider's discretion.      For prescription refill requests, have your pharmacy contact our office and allow 72 hours for refills to be completed.    Today you received the following chemotherapy and/or immunotherapy agents: Keytruda      To help prevent nausea and vomiting after your treatment, we encourage you to take your nausea medication as directed.  BELOW ARE SYMPTOMS THAT SHOULD BE REPORTED IMMEDIATELY: *FEVER GREATER THAN 100.4 F (38 C) OR HIGHER *CHILLS OR SWEATING *NAUSEA AND VOMITING THAT IS NOT CONTROLLED WITH YOUR NAUSEA MEDICATION *UNUSUAL SHORTNESS OF BREATH *UNUSUAL BRUISING OR BLEEDING *URINARY PROBLEMS (pain or burning when urinating, or frequent urination) *BOWEL PROBLEMS (unusual diarrhea, constipation, pain near the anus) TENDERNESS IN MOUTH AND THROAT WITH OR WITHOUT PRESENCE OF ULCERS (sore throat, sores in mouth, or a toothache) UNUSUAL RASH, SWELLING OR PAIN  UNUSUAL VAGINAL DISCHARGE OR ITCHING   Items with * indicate a potential emergency and should be followed up as soon as possible or go to the Emergency Department if any problems should occur.  Please show the CHEMOTHERAPY ALERT CARD or IMMUNOTHERAPY ALERT CARD at  check-in to the Emergency Department and triage nurse.  Should you have questions after your visit or need to cancel or reschedule your appointment, please contact [facility]  Dept: [phone]  and follow the prompts.  Office hours are 8:00 a.m. to 4:30 p.m. Monday - Friday. Please note that voicemails left after 4:00 p.m. may not be returned until the following business day.  We are closed weekends and major holidays. You have access to a nurse at all times for urgent questions. Please call the main number to the clinic Dept: [phone] and follow the prompts.   For any non-urgent questions, you may also contact your provider using MyChart. We now offer e-Visits for anyone 18 and older to request care online for non-urgent symptoms. For details visit [email or web address].   Also download the MyChart app! Go to the app store, search "MyChart", open the app, select [facility], and log in with your MyChart username and password.   

## 2023-05-02 NOTE — Assessment & Plan Note (Signed)
Her last imaging study showed positive response to therapy She has persistent disease in the vaginal cuff but overall I do not think it is worse We discussed expectant management with supportive care only She is not a candidate for further surgery or radiation I plan to repeat imaging study again in July We discussed importance of lifestyle changes and risk factor modifications

## 2023-05-02 NOTE — Progress Notes (Signed)
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[facility]) Her last imaging study showed positive response to therapy She has persistent disease in the vaginal cuff but overall I do not think it is worse We discussed expectant management with supportive care only She is not a candidate for further surgery or radiation I plan to repeat imaging study again in July We discussed importance of lifestyle changes and risk factor modifications  Essential hypertension I reviewed documented blood pressure from home which are within normal range Today, her elevated blood pressure could be due to whitecoat hypertension We will continue current antihypertensives  Orders Placed This Encounter  Procedures   CBC with Differential (Cancer Center Only)    Standing Status:   Future    Standing Expiration Date:   05/26/2024   CMP (Cancer Center only)    Standing Status:   Future    Standing Expiration Date:   05/26/2024   CBC with Differential (Cancer Center Only)    Standing Status:   Future    Standing Expiration Date:   06/16/2024   CMP (Cancer Center only)    Standing Status:   Future    Standing Expiration Date:   06/16/2024    All questions were answered. The patient knows to call the clinic with any problems, questions or concerns. The total time spent in the appointment was 25 minutes encounter with patients including review of chart and various tests results, discussions about plan of care and coordination of care plan   Artis Delay, MD 05/02/2023 3:51 PM  INTERVAL HISTORY: Please see below for problem oriented charting. she returns for treatment follow-up She tolerated treatment well and stated she is compliant taking medications as directed She denies significant vaginal bleeding She is not  symptomatic from high blood pressure  REVIEW OF SYSTEMS:   Constitutional: Denies fevers, chills or abnormal weight loss Eyes: Denies blurriness of vision Ears, nose, mouth, throat, and face: Denies mucositis or sore throat Respiratory: Denies cough, dyspnea or wheezes Cardiovascular: Denies palpitation, chest discomfort or lower extremity swelling Gastrointestinal:  Denies nausea, heartburn or change in bowel habits Skin: Denies abnormal skin rashes Lymphatics: Denies new lymphadenopathy or easy bruising Neurological:Denies numbness, tingling or new weaknesses Behavioral/Psych: Mood is stable, no new changes  All other systems were reviewed with the patient and are negative.  I have reviewed the past medical history, past surgical history, social history and family history with the patient and they are unchanged from previous note.  ALLERGIES:  has No Known Allergies.  MEDICATIONS:  Current Outpatient Medications  Medication Sig Dispense Refill   amLODipine (NORVASC) 10 MG tablet Take 1 tablet (10 mg total) by mouth daily. 90 tablet 1   aspirin 81 MG EC tablet Take 1 tablet (81 mg total) by mouth daily. 30 tablet 12   atorvastatin (LIPITOR) 80 MG tablet Take 1 tablet (80 mg total) by mouth at bedtime. 90 tablet 1   empagliflozin (JARDIANCE) 10 MG TABS tablet Take 1 tablet (10 mg total) by mouth daily before breakfast. 90 tablet 1   gabapentin (NEURONTIN) 300 MG capsule Take 1 capsule (300 mg total) by mouth at bedtime. 90 capsule 1   glimepiride (AMARYL) 4 MG tablet Take 1 tablet (4 mg total) by mouth daily with breakfast. 90 tablet 1   isosorbide mononitrate (IMDUR) 30  MG 24 hr tablet Take 2 tablets (60 mg total) by mouth daily. 180 tablet 1   lenvatinib 10 mg daily dose (LENVIMA, 10 MG DAILY DOSE,) capsule Take 1 capsule (10 mg total) by mouth daily. 30 capsule 11   lidocaine-prilocaine (EMLA) cream Apply 1 Application topically as needed. 30 g 0   lisinopril (ZESTRIL) 10 MG tablet  Take 3 tablets (30 mg total) by mouth daily. 270 tablet 1   metFORMIN (GLUCOPHAGE) 500 MG tablet Take 2 tablets (1,000 mg total) by mouth 2 (two) times daily with a meal. 360 tablet 1   metoprolol tartrate (LOPRESSOR) 100 MG tablet Take 1 tablet (100 mg total) by mouth 2 (two) times daily. 180 tablet 1   nitroGLYCERIN (NITROSTAT) 0.4 MG SL tablet Place 1 tablet (0.4 mg total) under the tongue every 5 (five) minutes as needed for chest pain. 25 tablet 2   terbinafine (LAMISIL) 250 MG tablet Take 1 tablet (250 mg total) by mouth daily. 90 tablet 0   tirzepatide (MOUNJARO) 10 MG/0.5ML Pen Inject 10 mg into the skin once a week. 6 mL 6   No current facility-administered medications for this visit.   Facility-Administered Medications Ordered in Other Visits  Medication Dose Route Frequency Provider Last Rate Last Admin   sodium chloride flush (NS) 0.9 % injection 10 mL  10 mL Intracatheter PRN Bertis Ruddy, Shamara Soza, MD   10 mL at 05/02/23 1350    SUMMARY OF ONCOLOGIC HISTORY: Oncology History Overview Note  Pap 12/14/20: adenocarcinoma, HPV negative  HER-2 positive by FISH Her-2 equivocal by [facility]  [facility] stable    Papillary serous adenocarcinoma of endometrium (HCC)   Initial Diagnosis   Endometrial carcinoma (HCC)   01/12/2021 Initial Biopsy   A. ENDOCERVIX, CURETTAGE:  - Adenocarcinoma.  B. ENDOMETRIUM, BIOPSY:  - Adenocarcinoma.  COMMENT:  The differential diagnosis includes high grade serous carcinoma.  Both  specimens have a similar morphology; high grade serous carcinoma more  commonly arises in the endometrium.  Results reported to Dr. Valentina Shaggy  on 01/15/2021.  Dr. Luisa Hart reviewed the case.    01/16/2021 Tumor Marker   Patient's tumor was tested for the following markers: CA-125 Results of the tumor marker test revealed normal value, 10.7   01/25/2021 Imaging   CT C/A/P: 1. Small volume fluid in the endometrial canal with 7 mm hypoattenuating lesion in the myometrium of the anterior  fundus. 2. No definite evidence for metastatic disease in the chest, abdomen, or pelvis. Small lymph nodes are noted along both pelvic sidewalls. Attention on follow-up recommended. 3. 5 mm ground-glass opacity peripheral left upper lobe. This is likely related to infection/inflammation and potentially scar. Attention on surveillance imaging recommended. 4. 5.8 cm lesion posterior lower uterine segment likely a fibroid. Ultrasound exam from 01/28/2010 demonstrated a 5.2 cm lesion in the left aspect of the posterior lower uterine body. 5. Aortic Atherosclerosis (ICD10-I70.0).   01/29/2021 Surgery   TRH/BSO, SLN biopsy left, selective pelvic LND right, omentectomy  Findings: On EUA, 10-12cm enlarged mobile uterus. On intra-abdomina entry, minimal adhesions between the liver and anterior abdomen on the right. Some changes c/w fatty liver on the left. Omentum, stomach, small and large bowel all grossly normal. Bilateral ovaries normal appearing. Uterus with 5-6cm posterior fibroid, otherwise normal appearing. No mapping to right pelvis. On left, mapping to the level of the superior vessel artery, enlarged lymph node noted (likely sentinel) within the upper aspect of the obturator space. In bilateral pelvic basins, multiple enlarged lymph nodes. On  the right, enlarged lymph nodes extended superiorly along the common iliac vessels. At the end of surgery, no obvious abdominal or pelvic evidence of disease.   01/29/2021 Pathology Results   A. SENTINEL LYMPH NODE, LEFT INTERNAL ILIAC, BIOPSY:  - Metastatic carcinoma in (1) of (1) lymph node.   B. SENTINEL LYMPH NODE, LEFT OBTURATOR, BIOPSY:  - Metastatic carcinoma in (1) of (1) lymph node.   C. LYMPH NODES, LEFT PELVIC, DISSECTION:  - Metastatic carcinoma in (1) of (3) lymph nodes.   D. LYMPH NODE, RIGHT EXTERNAL AND COMMON ILIAC, BIOPSY:  - Metastatic carcinoma in (1) of (1) lymph node.   E. UTERUS, CERVIX AND BILATERAL FALLOPIAN TUBES AND  OVARIES, TOTAL  HYSTERECTOMY AND BILATERAL SALPINGO-OOPHORECTOMY:  - High grade serous carcinoma of endometrium, with invasion more than  half of the myometrium.  - Tumor invades the stromal connective tissue of the cervix.  - No involvement of uterine serosa or adnexa.  - Lymphovascular invasion is identified.  - See oncology table.   F. OMENTUM, OMENTECTOMY:  - Omentum, negative for carcinoma.   G. LYMPH NODES, RIGHT PELVIC, DISSECTION:  - Two lymph nodes, negative for carcinoma (0/2).   ONCOLOGY TABLE:   UTERUS, CARCINOMA OR CARCINOSARCOMA: Resection   Procedure: Total hysterectomy and bilateral salpingo-oophorectomy,  Omentectomy, Lymph node sampling  Histologic Type: Serous carcinoma  Histologic Grade: High grade  Myometrial Invasion: > 50%  Uterine Serosa Involvement: Not identified  Cervical Stroma Involvement: Present  Other Tissue/Organ Involvement: Not identified  Peritoneal/Ascitic Fluid: Negative for carcinoma  Lymphovascular Invasion: Present  Regional Lymph Nodes:       Pelvic Lymph Nodes Examined:            2 Sentinel            6 Non-Sentinel            8 Total       Pelvic Lymph Nodes with Metastasis: 4            Macrometastasis: 3            Micrometastasis: 1            Isolated Tumor Cells: 0            Laterality of Lymph Nodes with Tumor: Right (non-sentinel),  Left (sentinel and non-sentinel)            Extracapsular Extension: Present       Para-Aortic Lymph Nodes Examined: Not applicable        Para-Aortic Lymph Nodes with Metastasis: Not applicable  Distant Metastasis:       Distant Site(s) Involved: Omentum: Not involved  Pathologic Stage Classification (pTNM, AJCC 8th Edition): pT2, pN1a  Ancillary Studies: HER2 will be ordered  Additional Findings: Leiomyomata.  Adenomyosis.  Representative Tumor Block: B1  Comment: Dr. Luisa Hart reviewed select slides.  (v4.2.0.1)    01/29/2021 Cancer Staging   Staging form: Corpus Uteri - Carcinoma  and Carcinosarcoma, AJCC 8th Edition - Clinical stage from 01/29/2021: FIGO Stage IIIC1 (cT1b, cN1a, cM0) - Signed by Carver Fila, MD on 02/02/2021 Histopathologic type: Mixed cell adenocarcinoma Stage prefix: Initial diagnosis Method of lymph node assessment: Other Histologic grade (G): G3 Histologic grading system: 3 grade system Lymph-vascular invasion (LVI): LVI present/identified, NOS Peritoneal cytology results: Negative Pelvic nodal status: Positive Number of pelvic nodes positive from dissection: 4 Number of pelvic nodes examined during dissection: 8 Para-aortic status: Not assessed Lymph node metastasis: Present Omentectomy performed: Yes Morcellation performed: No  02/19/2021 Echocardiogram    1. Left ventricular ejection fraction, by estimation, is 55 to 60%. The left ventricle has normal function. The left ventricle demonstrates regional wall motion abnormalities (see scoring diagram/findings for description). There is mild left ventricular  hypertrophy. Left ventricular diastolic parameters are consistent with Grade I diastolic dysfunction (impaired relaxation). There is moderate hypokinesis of the left ventricular, basal septal wall and inferior wall. The average left ventricular global longitudinal strain is -17.9 %. The global longitudinal strain is abnormal.  2. Right ventricular systolic function is normal. The right ventricular size is normal. There is normal pulmonary artery systolic pressure. The estimated right ventricular systolic pressure is 21.7 mmHg.  3. The mitral valve is abnormal. Trivial mitral valve regurgitation.  4. The aortic valve is tricuspid. Aortic valve regurgitation is not visualized.  5. The inferior vena cava is normal in size with greater than 50% respiratory variability, suggesting right atrial pressure of 3 mmHg.   02/20/2021 Procedure   Successful placement of a right IJ approach Power Port with ultrasound and fluoroscopic guidance. The  catheter is ready for use.   02/28/2021 - 06/14/2021 Chemotherapy    Patient is on Treatment Plan: UTERINE CARBOPLATIN AUC 6 / PACLITAXEL Q21D       04/20/2021 Imaging   Status post hysterectomy and suspected bilateral salpingo-oophorectomy.   2.2 cm fluid density lesion along the left pelvic sidewall likely reflects a postoperative seroma.   No findings suspicious for recurrent or metastatic disease.   07/16/2021 - 08/16/2021 Radiation Therapy   Radiation Treatment Dates: 07/16/2021 through 08/16/2021 Site Technique Total Dose (Gy) Dose per Fx (Gy) Completed Fx Beam Energies  Vagina: Pelvis HDR-brachy 30/30 6 5/5 Ir-192        10/01/2021 Imaging   Increasing size of LEFT pelvic lymph nodes, largest approximately 11 mm along the external iliac chain. Given findings and history could consider PET for further evaluation as warranted.   Cystic area along the LEFT pelvic sidewall that was seen previously has improved.   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   Aortic Atherosclerosis (ICD10-I70.0).   12/21/2021 Imaging   1. Enlarging soft tissue masses involving the vaginal cuff. 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 Speculum examination and re-biopsy may be confirmatory. PET-CT may be helpful. 2. New small sub 5 mm perirectal and sigmoid mesocolon nodes. 3. Slight progression of pelvic adenopathy as detailed above. 4. No findings suspicious for abdominal metastatic disease or osseous metastatic disease. 5. Stable age advanced atherosclerotic calcifications involving the aorta and branch vessels and coronary arteries   12/25/2021 Echocardiogram    1. Global longitudinal strain is -16.1% Mild basal inferior hypoinesis. Compared to echo from 02/2021, no change in overall LVEF /regional wall motion. Global longitudinal strain is mildly less negative (-17.9% to -16.1%.  Left ventricular ejection fraction, by estimation, is 65 to 70%. The left ventricle has normal function. The left ventricle has no regional wall motion abnormalities. There is mild left ventricular hypertrophy. Left ventricular diastolic parameters are indeterminate.  2. Right ventricular systolic function is normal. The right ventricular size is normal. There is normal pulmonary artery systolic pressure.  3. Trivial mitral valve regurgitation.  4. The aortic valve is normal in structure. Aortic valve regurgitation is not visualized.  5. The inferior vena cava is normal in size with greater than 50% respiratory variability, suggesting right atrial pressure of 3 mmHg.  01/01/2022 - 01/22/2022 Chemotherapy   Patient is on Treatment Plan : UTERINE SEROUS CARCINOMA Carboplatin + Paclitaxel + Trastuzumab q21d x 6 Cycles / Trastuzumab q21d     03/29/2022 Pathology Results   A.   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   COMMENT:   The carcinoma has diffuse strong Pax8 immunoreactivity (Mullerian marker) and abnormal overexpression of nuclear p53 and relatively diffuse strong p16 staining.  Estrogen receptor (ER) is weakly reactive  and there is focal strong Napsin A immunoreactivity.  The immunophenotype is most characteristic of serous carcinoma.   The history of prior treatment for endometrial serous carcinoma is noted.   04/12/2022 Imaging   1. Surgical change of prior hysterectomy with increased nodular thickening at the vaginal cuff, suspicious for recurrent disease. Consider further evaluation with PET-CT versus direct tissue sampling. 2. Unchanged prominent/mildly enlarged retroperitoneal and pelvic lymph nodes, nonspecific but in the setting of disease recurrence these would be at least somewhat suspicious for nodal involvement. Consider attention on follow-up  imaging. 3. No evidence of metastatic disease within the chest. 4. Left-sided colonic diverticulosis without findings of acute diverticulitis. 5.  Aortic Atherosclerosis (ICD10-I70.0).   04/16/2022 - 08/09/2022 Chemotherapy   Patient is on Treatment Plan : UTERINE Lenvatinib (20) D1-21 + Pembrolizumab (200) D1 q21d     04/16/2022 -  Chemotherapy   Patient is on Treatment Plan : UTERINE Lenvatinib (20) D1-21 + Pembrolizumab (200) D1 q21d     07/09/2022 Imaging   Mild increase in mild left para-aortic and left iliac lymphadenopathy, consistent with metastatic lymphadenopathy.   Stable nodular thickening of vaginal cuff, consistent with vaginal recurrence.   Colonic diverticulosis, without radiographic evidence of diverticulitis.   Probable tiny calcified gallstone. No radiographic evidence of cholecystitis.   Aortic Atherosclerosis (ICD10-I70.0).     09/12/2022 Imaging   1. Mild decrease in thickening along the vaginal cuff. 2. LEFT iliac and LEFT periaortic retroperitoneal adenopathy is stable to slightly decreased. 3. No evidence of new lymphadenopathy or progressive metastatic uterine carcinoma.      12/19/2022 Imaging   Persistent thickening of the walls of the vaginal cuff with some luminal fluid and air. The focal nodular component measured previously is less well-defined today relative to the adjacent wall thickening.   Persistent lymph node enlargement in the retroperitoneum and left pelvic sidewall. Some areas are similar and others are slightly increased. No new areas of lymph node enlargement.    Gallstones.     03/21/2023 Imaging   1. Persistent mass-like thickening of the vagina, with increasing nodular component in the right side of the vaginal apex concerning for progressive disease. 2. Previously noted lymphadenopathy in the pelvis and retroperitoneum appears relatively similar to the prior study. 3. No new extranodal sites of metastatic disease noted in the abdomen or  pelvis. 4. Trace volume of ascites in the low anatomic pelvis.  5. Colonic diverticulosis without evidence of acute diverticulitis at this time. 6. Cholelithiasis without evidence of acute cholecystitis. 7. Aortic atherosclerosis, in addition to at least 2 vessel coronary artery disease. Please note that although the presence of coronary artery calcium documents the presence of coronary artery disease, the severity of this disease and any potential stenosis cannot be assessed on this non-gated CT examination. Assessment for potential risk factor modification, dietary therapy or pharmacologic therapy may be warranted, if clinically indicated.     PHYSICAL EXAMINATION: ECOG  PERFORMANCE STATUS: 0 - Asymptomatic  Vitals:   05/02/23 1129  BP: (!) 150/86  Pulse: 78  Resp: 18  Temp: (!) 97.4 F (36.3 C)  SpO2: 100%   Filed Weights   05/02/23 1129  Weight: 194 lb 3.2 oz (88.1 kg)    GENERAL:alert, no distress and comfortable  NEURO: alert & oriented x 3 with fluent speech, no focal motor/sensory deficits  LABORATORY DATA:  I have reviewed the data as listed    Component Value Date/Time   NA 138 05/02/2023 1036   NA 143 10/18/2020 1028   K 3.9 05/02/2023 1036   CL 104 05/02/2023 1036   CO2 26 05/02/2023 1036   GLUCOSE 135 (H) 05/02/2023 1036   BUN 14 05/02/2023 1036   BUN 13 10/18/2020 1028   CREATININE 1.03 (H) 05/02/2023 1036   CREATININE 0.79 02/11/2017 0943   CALCIUM 9.5 05/02/2023 1036   PROT 7.2 05/02/2023 1036   PROT 6.7 10/18/2020 1028   ALBUMIN 3.7 05/02/2023 1036   ALBUMIN 4.1 10/18/2020 1028   AST 16 05/02/2023 1036   ALT 12 05/02/2023 1036   ALKPHOS 81 05/02/2023 1036   BILITOT 0.4 05/02/2023 1036   GFRNONAA >60 05/02/2023 1036   GFRNONAA 83 02/11/2017 0943   GFRAA 70 10/18/2020 1028   GFRAA >89 02/11/2017 0943    No results found for: "SPEP", "UPEP"  Lab Results  Component Value Date   WBC 5.5 05/02/2023   NEUTROABS 3.0 05/02/2023   HGB 12.1  05/02/2023   HCT 36.8 05/02/2023   MCV 90.0 05/02/2023   PLT 336 05/02/2023      Chemistry      Component Value Date/Time   NA 138 05/02/2023 1036   NA 143 10/18/2020 1028   K 3.9 05/02/2023 1036   CL 104 05/02/2023 1036   CO2 26 05/02/2023 1036   BUN 14 05/02/2023 1036   BUN 13 10/18/2020 1028   CREATININE 1.03 (H) 05/02/2023 1036   CREATININE 0.79 02/11/2017 0943      Component Value Date/Time   CALCIUM 9.5 05/02/2023 1036   ALKPHOS 81 05/02/2023 1036   AST 16 05/02/2023 1036   ALT 12 05/02/2023 1036   BILITOT 0.4 05/02/2023 1036

## 2023-05-02 NOTE — Assessment & Plan Note (Signed)
I reviewed documented blood pressure from home which are within normal range Today, her elevated blood pressure could be due to whitecoat hypertension We will continue current antihypertensives 

## 2023-05-03 ENCOUNTER — Other Ambulatory Visit: Payer: Self-pay

## 2023-05-05 ENCOUNTER — Encounter: Payer: Self-pay | Admitting: Hematology and Oncology

## 2023-05-13 ENCOUNTER — Other Ambulatory Visit: Payer: Self-pay

## 2023-05-13 ENCOUNTER — Encounter: Payer: Self-pay | Admitting: Hematology and Oncology

## 2023-05-13 ENCOUNTER — Other Ambulatory Visit (HOSPITAL_COMMUNITY): Payer: Self-pay

## 2023-05-14 ENCOUNTER — Other Ambulatory Visit: Payer: Self-pay

## 2023-05-14 ENCOUNTER — Other Ambulatory Visit: Payer: Self-pay | Admitting: Hematology and Oncology

## 2023-05-14 ENCOUNTER — Encounter: Payer: Self-pay | Admitting: Hematology and Oncology

## 2023-05-14 ENCOUNTER — Telehealth: Payer: Self-pay

## 2023-05-14 MED ORDER — TRAMADOL HCL 50 MG PO TABS
50.0000 mg | ORAL_TABLET | Freq: Four times a day (QID) | ORAL | 0 refills | Status: DC | PRN
  Filled 2023-05-14: qty 28, 7d supply, fill #0

## 2023-05-14 NOTE — Telephone Encounter (Signed)
Called and given below message to daughter. She verbalized understanding and appreciated the call. 

## 2023-05-14 NOTE — Telephone Encounter (Signed)
Called [name] regarding [employer]. [name] has been having vaginal spotting on and off for about 1 month. She has been having abdominal pain for 2-3 weeks. She has been taking advil and tylenol that is not helping. Instructed to stop Advil. [name] verbalized understanding.  She is asking if prescription can be sent to [facility] pharmacy to help with pain.

## 2023-05-14 NOTE — Telephone Encounter (Signed)
I sent a small amount of tramadol for her to try Warn her side-effects of sedation and constipation She can combine with tylenol

## 2023-05-15 ENCOUNTER — Other Ambulatory Visit: Payer: Self-pay

## 2023-05-16 ENCOUNTER — Other Ambulatory Visit (HOSPITAL_COMMUNITY): Payer: Self-pay

## 2023-05-16 ENCOUNTER — Other Ambulatory Visit: Payer: Self-pay

## 2023-05-19 ENCOUNTER — Telehealth: Payer: Self-pay | Admitting: Family Medicine

## 2023-05-19 ENCOUNTER — Other Ambulatory Visit: Payer: Self-pay

## 2023-05-19 MED ORDER — TIRZEPATIDE 12.5 MG/0.5ML ~~LOC~~ SOAJ
12.5000 mg | SUBCUTANEOUS | 6 refills | Status: DC
  Filled 2023-05-19: qty 6, 84d supply, fill #0
  Filled 2023-05-20: qty 2, 28d supply, fill #0

## 2023-05-19 NOTE — Telephone Encounter (Signed)
Prescription for Mounjaro 12.5 mg has been sent to her pharmacy.

## 2023-05-19 NOTE — Telephone Encounter (Signed)
Pt called in about med, monjauro being on backorder and it has been 2 weeks, she wants to know what other alternative she has. Please call back

## 2023-05-20 ENCOUNTER — Other Ambulatory Visit (HOSPITAL_COMMUNITY): Payer: Self-pay

## 2023-05-20 ENCOUNTER — Other Ambulatory Visit: Payer: Self-pay

## 2023-05-23 ENCOUNTER — Other Ambulatory Visit (HOSPITAL_COMMUNITY): Payer: Self-pay

## 2023-05-26 ENCOUNTER — Other Ambulatory Visit (HOSPITAL_COMMUNITY): Payer: Self-pay

## 2023-05-26 ENCOUNTER — Encounter (HOSPITAL_COMMUNITY): Payer: Self-pay

## 2023-05-27 ENCOUNTER — Inpatient Hospital Stay: Payer: Medicare Other

## 2023-05-27 ENCOUNTER — Telehealth: Payer: Self-pay | Admitting: Hematology and Oncology

## 2023-05-27 ENCOUNTER — Inpatient Hospital Stay: Payer: Medicare Other | Admitting: Hematology and Oncology

## 2023-05-27 ENCOUNTER — Other Ambulatory Visit: Payer: Self-pay | Admitting: Hematology and Oncology

## 2023-05-27 NOTE — Telephone Encounter (Signed)
Spoke with patient confirming upcoming appointment change  

## 2023-05-29 ENCOUNTER — Telehealth: Payer: Self-pay

## 2023-05-29 ENCOUNTER — Other Ambulatory Visit: Payer: Self-pay

## 2023-05-29 ENCOUNTER — Inpatient Hospital Stay (HOSPITAL_BASED_OUTPATIENT_CLINIC_OR_DEPARTMENT_OTHER): Payer: Medicare Other | Admitting: Hematology and Oncology

## 2023-05-29 ENCOUNTER — Encounter: Payer: Self-pay | Admitting: Hematology and Oncology

## 2023-05-29 ENCOUNTER — Inpatient Hospital Stay: Payer: Medicare Other

## 2023-05-29 ENCOUNTER — Inpatient Hospital Stay: Payer: Medicare Other | Attending: Gynecologic Oncology

## 2023-05-29 VITALS — BP 108/72 | HR 65 | Resp 16

## 2023-05-29 VITALS — BP 104/67 | HR 75 | Temp 97.4°F | Resp 18 | Ht 62.0 in | Wt 187.4 lb

## 2023-05-29 DIAGNOSIS — K5900 Constipation, unspecified: Secondary | ICD-10-CM | POA: Insufficient documentation

## 2023-05-29 DIAGNOSIS — Z7984 Long term (current) use of oral hypoglycemic drugs: Secondary | ICD-10-CM | POA: Diagnosis not present

## 2023-05-29 DIAGNOSIS — E1169 Type 2 diabetes mellitus with other specified complication: Secondary | ICD-10-CM | POA: Diagnosis not present

## 2023-05-29 DIAGNOSIS — C541 Malignant neoplasm of endometrium: Secondary | ICD-10-CM

## 2023-05-29 DIAGNOSIS — I110 Hypertensive heart disease with heart failure: Secondary | ICD-10-CM

## 2023-05-29 DIAGNOSIS — I3481 Nonrheumatic mitral (valve) annulus calcification: Secondary | ICD-10-CM | POA: Diagnosis not present

## 2023-05-29 DIAGNOSIS — E669 Obesity, unspecified: Secondary | ICD-10-CM | POA: Insufficient documentation

## 2023-05-29 DIAGNOSIS — E1159 Type 2 diabetes mellitus with other circulatory complications: Secondary | ICD-10-CM

## 2023-05-29 DIAGNOSIS — R1030 Lower abdominal pain, unspecified: Secondary | ICD-10-CM | POA: Insufficient documentation

## 2023-05-29 DIAGNOSIS — Z7982 Long term (current) use of aspirin: Secondary | ICD-10-CM | POA: Insufficient documentation

## 2023-05-29 DIAGNOSIS — R809 Proteinuria, unspecified: Secondary | ICD-10-CM | POA: Insufficient documentation

## 2023-05-29 DIAGNOSIS — I2510 Atherosclerotic heart disease of native coronary artery without angina pectoris: Secondary | ICD-10-CM | POA: Diagnosis not present

## 2023-05-29 DIAGNOSIS — I10 Essential (primary) hypertension: Secondary | ICD-10-CM | POA: Insufficient documentation

## 2023-05-29 DIAGNOSIS — I5032 Chronic diastolic (congestive) heart failure: Secondary | ICD-10-CM

## 2023-05-29 DIAGNOSIS — Z79899 Other long term (current) drug therapy: Secondary | ICD-10-CM | POA: Insufficient documentation

## 2023-05-29 DIAGNOSIS — R590 Localized enlarged lymph nodes: Secondary | ICD-10-CM | POA: Diagnosis not present

## 2023-05-29 DIAGNOSIS — Z5112 Encounter for antineoplastic immunotherapy: Secondary | ICD-10-CM | POA: Insufficient documentation

## 2023-05-29 DIAGNOSIS — I700 Atherosclerosis of aorta: Secondary | ICD-10-CM | POA: Diagnosis not present

## 2023-05-29 DIAGNOSIS — E119 Type 2 diabetes mellitus without complications: Secondary | ICD-10-CM | POA: Diagnosis not present

## 2023-05-29 DIAGNOSIS — K8020 Calculus of gallbladder without cholecystitis without obstruction: Secondary | ICD-10-CM | POA: Insufficient documentation

## 2023-05-29 DIAGNOSIS — R5383 Other fatigue: Secondary | ICD-10-CM | POA: Diagnosis not present

## 2023-05-29 LAB — TSH: TSH: 2.889 u[IU]/mL (ref 0.350–4.500)

## 2023-05-29 LAB — CMP (CANCER CENTER ONLY)
ALT: 8 U/L (ref 0–44)
AST: 11 U/L — ABNORMAL LOW (ref 15–41)
Albumin: 3.5 g/dL (ref 3.5–5.0)
Alkaline Phosphatase: 73 U/L (ref 38–126)
Anion gap: 9 (ref 5–15)
BUN: 30 mg/dL — ABNORMAL HIGH (ref 8–23)
CO2: 25 mmol/L (ref 22–32)
Calcium: 9.7 mg/dL (ref 8.9–10.3)
Chloride: 106 mmol/L (ref 98–111)
Creatinine: 1.32 mg/dL — ABNORMAL HIGH (ref 0.44–1.00)
GFR, Estimated: 45 mL/min — ABNORMAL LOW (ref >60)
Glucose, Bld: 186 mg/dL — ABNORMAL HIGH (ref 70–99)
Potassium: 3.9 mmol/L (ref 3.5–5.1)
Sodium: 140 mmol/L (ref 135–145)
Total Bilirubin: 0.3 mg/dL (ref 0.3–1.2)
Total Protein: 7.4 g/dL (ref 6.5–8.1)

## 2023-05-29 LAB — CBC WITH DIFFERENTIAL (CANCER CENTER ONLY)
Abs Immature Granulocytes: 0.01 10*3/uL (ref 0.00–0.07)
Basophils Absolute: 0 10*3/uL (ref 0.0–0.1)
Basophils Relative: 0 %
Eosinophils Absolute: 0.1 10*3/uL (ref 0.0–0.5)
Eosinophils Relative: 1 %
HCT: 38.1 % (ref 36.0–46.0)
Hemoglobin: 12.3 g/dL (ref 12.0–15.0)
Immature Granulocytes: 0 %
Lymphocytes Relative: 28 %
Lymphs Abs: 1.7 10*3/uL (ref 0.7–4.0)
MCH: 29.5 pg (ref 26.0–34.0)
MCHC: 32.3 g/dL (ref 30.0–36.0)
MCV: 91.4 fL (ref 80.0–100.0)
Monocytes Absolute: 0.7 10*3/uL (ref 0.1–1.0)
Monocytes Relative: 11 %
Neutro Abs: 3.7 10*3/uL (ref 1.7–7.7)
Neutrophils Relative %: 60 %
Platelet Count: 360 10*3/uL (ref 150–400)
RBC: 4.17 MIL/uL (ref 3.87–5.11)
RDW: 14.6 % (ref 11.5–15.5)
WBC Count: 6.1 10*3/uL (ref 4.0–10.5)
nRBC: 0 % (ref 0.0–0.2)

## 2023-05-29 LAB — TOTAL PROTEIN, URINE DIPSTICK: Protein, ur: >2000 mg/dL — AB

## 2023-05-29 MED ORDER — ONDANSETRON HCL 4 MG/2ML IJ SOLN
8.0000 mg | Freq: Once | INTRAMUSCULAR | Status: AC
  Administered 2023-05-29: 8 mg via INTRAVENOUS
  Filled 2023-05-29: qty 4

## 2023-05-29 MED ORDER — ISOSORBIDE MONONITRATE ER 30 MG PO TB24
60.0000 mg | ORAL_TABLET | Freq: Every day | ORAL | 1 refills | Status: DC
Start: 2023-05-29 — End: 2023-09-24

## 2023-05-29 MED ORDER — SODIUM CHLORIDE 0.9 % IV SOLN: Freq: Once | INTRAVENOUS | Status: AC

## 2023-05-29 MED ORDER — SODIUM CHLORIDE 0.9 % IV SOLN
200.0000 mg | Freq: Once | INTRAVENOUS | Status: AC
  Administered 2023-05-29: 200 mg via INTRAVENOUS
  Filled 2023-05-29: qty 200

## 2023-05-29 MED ORDER — LISINOPRIL 10 MG PO TABS
30.0000 mg | ORAL_TABLET | Freq: Every day | ORAL | 1 refills | Status: DC
Start: 2023-05-29 — End: 2023-09-24

## 2023-05-29 MED ORDER — HEPARIN SOD (PORK) LOCK FLUSH 100 UNIT/ML IV SOLN
500.0000 [IU] | Freq: Once | INTRAVENOUS | Status: AC | PRN
  Administered 2023-05-29: 500 [IU]

## 2023-05-29 MED ORDER — SODIUM CHLORIDE 0.9% FLUSH
10.0000 mL | INTRAVENOUS | Status: DC | PRN
  Administered 2023-05-29: 10 mL

## 2023-05-29 MED ORDER — MORPHINE SULFATE (PF) 4 MG/ML IV SOLN
4.0000 mg | Freq: Once | INTRAVENOUS | Status: AC
  Administered 2023-05-29: 4 mg via INTRAVENOUS
  Filled 2023-05-29: qty 1

## 2023-05-29 MED ORDER — SODIUM CHLORIDE 0.9% FLUSH
10.0000 mL | Freq: Once | INTRAVENOUS | Status: AC | PRN
  Administered 2023-05-29: 10 mL

## 2023-05-29 MED ORDER — AMLODIPINE BESYLATE 10 MG PO TABS
10.0000 mg | ORAL_TABLET | Freq: Every day | ORAL | 1 refills | Status: DC
Start: 2023-05-29 — End: 2023-09-24

## 2023-05-29 MED ORDER — GLIMEPIRIDE 4 MG PO TABS
4.0000 mg | ORAL_TABLET | Freq: Every day | ORAL | 1 refills | Status: DC
Start: 2023-05-29 — End: 2023-09-24

## 2023-05-29 NOTE — Telephone Encounter (Signed)
Called and scheduled appt on 6/20 at 0820. Daughter is aware of appt.

## 2023-05-29 NOTE — Patient Instructions (Signed)
[facility]  Discharge Instructions: Thank you for choosing [facility] to provide your oncology and hematology care.   If you have a lab appointment with the Cancer Center, please go directly to the Cancer Center and check in at the registration area.   Wear comfortable clothing and clothing appropriate for easy access to any Portacath or PICC line.   We strive to give you quality time with your provider. You may need to reschedule your appointment if you arrive late (15 or more minutes).  Arriving late affects you and other patients whose appointments are after yours.  Also, if you miss three or more appointments without notifying the office, you may be dismissed from the clinic at the provider's discretion.      For prescription refill requests, have your pharmacy contact our office and allow 72 hours for refills to be completed.    Today you received the following chemotherapy and/or immunotherapy agents: pembrolizumab      To help prevent nausea and vomiting after your treatment, we encourage you to take your nausea medication as directed.  BELOW ARE SYMPTOMS THAT SHOULD BE REPORTED IMMEDIATELY: *FEVER GREATER THAN 100.4 F (38 C) OR HIGHER *CHILLS OR SWEATING *NAUSEA AND VOMITING THAT IS NOT CONTROLLED WITH YOUR NAUSEA MEDICATION *UNUSUAL SHORTNESS OF BREATH *UNUSUAL BRUISING OR BLEEDING *URINARY PROBLEMS (pain or burning when urinating, or frequent urination) *BOWEL PROBLEMS (unusual diarrhea, constipation, pain near the anus) TENDERNESS IN MOUTH AND THROAT WITH OR WITHOUT PRESENCE OF ULCERS (sore throat, sores in mouth, or a toothache) UNUSUAL RASH, SWELLING OR PAIN  UNUSUAL VAGINAL DISCHARGE OR ITCHING   Items with * indicate a potential emergency and should be followed up as soon as possible or go to the Emergency Department if any problems should occur.  Please show the CHEMOTHERAPY ALERT CARD or IMMUNOTHERAPY ALERT CARD at  check-in to the Emergency Department and triage nurse.  Should you have questions after your visit or need to cancel or reschedule your appointment, please contact [facility]  Dept: [phone]  and follow the prompts.  Office hours are 8:00 a.m. to 4:30 p.m. Monday - Friday. Please note that voicemails left after 4:00 p.m. may not be returned until the following business day.  We are closed weekends and major holidays. You have access to a nurse at all times for urgent questions. Please call the main number to the clinic Dept: [phone] and follow the prompts.   For any non-urgent questions, you may also contact your provider using MyChart. We now offer e-Visits for anyone 18 and older to request care online for non-urgent symptoms. For details visit [email or web address].   Also download the MyChart app! Go to the app store, search "MyChart", open the app, select , and log in with your MyChart username and password.   

## 2023-05-29 NOTE — Assessment & Plan Note (Addendum)
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

## 2023-05-29 NOTE — Assessment & Plan Note (Addendum)
Her blood pressure is very low due to recent profound weight loss I recommend holding off lisinopril, amlodipine and reduce the dose of Imdur I reminded the patient the importance of checking her blood pressure every day

## 2023-05-29 NOTE — Assessment & Plan Note (Signed)
She has severe lower abdominal pain There is a component of constipation I recommend increase tramadol and regular laxatives

## 2023-05-29 NOTE — Telephone Encounter (Signed)
-----   Message from Artis Delay, MD sent at 05/29/2023  2:07 PM EDT ----- I can see her next Thursday morning or at 10 am

## 2023-05-29 NOTE — Progress Notes (Signed)
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 which I suspect is due to uncontrolled diabetes Her blood pressure is better controlled We will hold Lenvima I recommend the patient to hold Amaryl  Essential hypertension Her blood pressure is very low due to recent profound weight loss I recommend holding off lisinopril, amlodipine and reduce the dose of Imdur I reminded the patient the importance of checking her blood pressure every day  Abdominal pain She has severe lower abdominal pain There is a component of constipation I recommend increase tramadol and regular laxatives  Orders Placed This Encounter  Procedures   CT ABDOMEN PELVIS W CONTRAST    Standing Status:   Future    Standing Expiration Date:   05/28/2024    Order Specific Question:   If indicated for the ordered procedure, I authorize the administration of contrast media per Radiology protocol    Answer:   Yes    Order Specific Question:   Does the patient have a contrast media/X-ray dye allergy?    Answer:   No    Order Specific Question:   Preferred imaging location?    Answer:   [facility]    Order Specific Question:   If indicated for the ordered procedure, I authorize the administration of oral contrast media per Radiology protocol    Answer:   Yes   CBC with Differential (Cancer Center Only)    Standing Status:   Future    Standing Expiration Date:    07/07/2024   CMP (Cancer Center only)    Standing Status:   Future    Standing Expiration Date:   07/07/2024    All questions were answered. The patient knows to call the clinic with any problems, questions or concerns. The total time spent in the appointment was 40 minutes encounter with patients including review of chart and various tests results, discussions about plan of care and coordination of care plan   Artis Delay, MD 05/29/2023 10:19 AM  INTERVAL HISTORY: Please see below for problem oriented charting. she returns for treatment follow-up with her daughter She is not doing well Over the past 2 weeks, she had loss of appetite, crampy abdominal pain and intermittent bleeding Her daughter cannot tell whether she is bleeding from her vagina or from the rectum She complain of excessive fatigue Her blood pressure is noted to be low She has not been checking her blood sugar or blood pressure on a regular basis She felt dehydrated today  REVIEW OF SYSTEMS:   Eyes: Denies blurriness of vision Ears, nose, mouth, throat, and face: Denies mucositis or sore throat Respiratory: Denies cough, dyspnea or wheezes Cardiovascular: Denies palpitation, chest discomfort or lower extremity swelling Skin: Denies abnormal skin rashes Lymphatics: Denies new lymphadenopathy or easy bruising Behavioral/Psych: Mood is stable, no new changes  All other systems were reviewed with the patient and are negative.  I have reviewed the past medical history, past surgical history, social history and family  history with the patient and they are unchanged from previous note.  ALLERGIES:  has No Known Allergies.  MEDICATIONS:  Current Outpatient Medications  Medication Sig Dispense Refill   amLODipine (NORVASC) 10 MG tablet Take 1 tablet (10 mg total) by mouth daily. 90 tablet 1   aspirin 81 MG EC tablet Take 1 tablet (81 mg total) by mouth daily. 30 tablet 12   atorvastatin (LIPITOR) 80 MG tablet Take 1  tablet (80 mg total) by mouth at bedtime. 90 tablet 1   empagliflozin (JARDIANCE) 10 MG TABS tablet Take 1 tablet (10 mg total) by mouth daily before breakfast. 90 tablet 1   gabapentin (NEURONTIN) 300 MG capsule Take 1 capsule (300 mg total) by mouth at bedtime. 90 capsule 1   glimepiride (AMARYL) 4 MG tablet Take 1 tablet (4 mg total) by mouth daily with breakfast. 90 tablet 1   isosorbide mononitrate (IMDUR) 30 MG 24 hr tablet Take 2 tablets (60 mg total) by mouth daily. 180 tablet 1   lenvatinib 10 mg daily dose (LENVIMA, 10 MG DAILY DOSE,) capsule Take 1 capsule (10 mg total) by mouth daily. 30 capsule 11   lidocaine-prilocaine (EMLA) cream Apply 1 Application topically as needed. 30 g 0   lisinopril (ZESTRIL) 10 MG tablet Take 3 tablets (30 mg total) by mouth daily. 270 tablet 1   metFORMIN (GLUCOPHAGE) 500 MG tablet Take 2 tablets (1,000 mg total) by mouth 2 (two) times daily with a meal. 360 tablet 1   metoprolol tartrate (LOPRESSOR) 100 MG tablet Take 1 tablet (100 mg total) by mouth 2 (two) times daily. 180 tablet 1   nitroGLYCERIN (NITROSTAT) 0.4 MG SL tablet Place 1 tablet (0.4 mg total) under the tongue every 5 (five) minutes as needed for chest pain. 25 tablet 2   terbinafine (LAMISIL) 250 MG tablet Take 1 tablet (250 mg total) by mouth daily. 90 tablet 0   tirzepatide (MOUNJARO) 10 MG/0.5ML Pen Inject 10 mg into the skin once a week. 6 mL 6   tirzepatide (MOUNJARO) 12.5 MG/0.5ML Pen Inject 12.5 mg into the skin once a week. 6 mL 6   traMADol (ULTRAM) 50 MG tablet Take 1 tablet (50 mg total) by mouth every 6 (six) hours as needed. 30 tablet 0   No current facility-administered medications for this visit.   Facility-Administered Medications Ordered in Other Visits  Medication Dose Route Frequency Provider Last Rate Last Admin   heparin lock flush 100 unit/mL  500 Units Intracatheter Once PRN Bertis Ruddy, Chelsie Burel, MD       morphine (PF) 4 MG/ML injection 4 mg  4 mg Intravenous Once Bertis Ruddy,  Axel Frisk, MD       ondansetron (ZOFRAN) injection 8 mg  8 mg Intravenous Once Bertis Ruddy, Shontel Santee, MD       pembrolizumab (KEYTRUDA) 200 mg in sodium chloride 0.9 % 50 mL chemo infusion  200 mg Intravenous Once Bertis Ruddy, Luanna Weesner, MD       sodium chloride flush (NS) 0.9 % injection 10 mL  10 mL Intracatheter PRN Artis Delay, MD        SUMMARY OF ONCOLOGIC HISTORY: Oncology History Overview Note  Pap 12/14/20: adenocarcinoma, HPV negative  HER-2 positive by FISH Her-2 equivocal by [facility]  [facility] stable    Papillary serous adenocarcinoma of endometrium (HCC)   Initial Diagnosis   Endometrial carcinoma (HCC)   01/12/2021 Initial Biopsy   A. ENDOCERVIX, CURETTAGE:  - Adenocarcinoma.  B. ENDOMETRIUM, BIOPSY:  - Adenocarcinoma.  COMMENT:  The differential diagnosis  includes high grade serous carcinoma.  Both  specimens have a similar morphology; high grade serous carcinoma more  commonly arises in the endometrium.  Results reported to Dr. Valentina Shaggy  on 01/15/2021.  Dr. Luisa Hart reviewed the case.    01/16/2021 Tumor Marker   Patient's tumor was tested for the following markers: CA-125 Results of the tumor marker test revealed normal value, 10.7   01/25/2021 Imaging   CT C/A/P: 1. Small volume fluid in the endometrial canal with 7 mm hypoattenuating lesion in the myometrium of the anterior fundus. 2. No definite evidence for metastatic disease in the chest, abdomen, or pelvis. Small lymph nodes are noted along both pelvic sidewalls. Attention on follow-up recommended. 3. 5 mm ground-glass opacity peripheral left upper lobe. This is likely related to infection/inflammation and potentially scar. Attention on surveillance imaging recommended. 4. 5.8 cm lesion posterior lower uterine segment likely a fibroid. Ultrasound exam from 01/28/2010 demonstrated a 5.2 cm lesion in the left aspect of the posterior lower uterine body. 5. Aortic Atherosclerosis (ICD10-I70.0).   01/29/2021 Surgery   TRH/BSO, SLN biopsy  left, selective pelvic LND right, omentectomy  Findings: On EUA, 10-12cm enlarged mobile uterus. On intra-abdomina entry, minimal adhesions between the liver and anterior abdomen on the right. Some changes c/w fatty liver on the left. Omentum, stomach, small and large bowel all grossly normal. Bilateral ovaries normal appearing. Uterus with 5-6cm posterior fibroid, otherwise normal appearing. No mapping to right pelvis. On left, mapping to the level of the superior vessel artery, enlarged lymph node noted (likely sentinel) within the upper aspect of the obturator space. In bilateral pelvic basins, multiple enlarged lymph nodes. On the right, enlarged lymph nodes extended superiorly along the common iliac vessels. At the end of surgery, no obvious abdominal or pelvic evidence of disease.   01/29/2021 Pathology Results   A. SENTINEL LYMPH NODE, LEFT INTERNAL ILIAC, BIOPSY:  - Metastatic carcinoma in (1) of (1) lymph node.   B. SENTINEL LYMPH NODE, LEFT OBTURATOR, BIOPSY:  - Metastatic carcinoma in (1) of (1) lymph node.   C. LYMPH NODES, LEFT PELVIC, DISSECTION:  - Metastatic carcinoma in (1) of (3) lymph nodes.   D. LYMPH NODE, RIGHT EXTERNAL AND COMMON ILIAC, BIOPSY:  - Metastatic carcinoma in (1) of (1) lymph node.   E. UTERUS, CERVIX AND BILATERAL FALLOPIAN TUBES AND OVARIES, TOTAL  HYSTERECTOMY AND BILATERAL SALPINGO-OOPHORECTOMY:  - High grade serous carcinoma of endometrium, with invasion more than  half of the myometrium.  - Tumor invades the stromal connective tissue of the cervix.  - No involvement of uterine serosa or adnexa.  - Lymphovascular invasion is identified.  - See oncology table.   F. OMENTUM, OMENTECTOMY:  - Omentum, negative for carcinoma.   G. LYMPH NODES, RIGHT PELVIC, DISSECTION:  - Two lymph nodes, negative for carcinoma (0/2).   ONCOLOGY TABLE:   UTERUS, CARCINOMA OR CARCINOSARCOMA: Resection   Procedure: Total hysterectomy and bilateral  salpingo-oophorectomy,  Omentectomy, Lymph node sampling  Histologic Type: Serous carcinoma  Histologic Grade: High grade  Myometrial Invasion: > 50%  Uterine Serosa Involvement: Not identified  Cervical Stroma Involvement: Present  Other Tissue/Organ Involvement: Not identified  Peritoneal/Ascitic Fluid: Negative for carcinoma  Lymphovascular Invasion: Present  Regional Lymph Nodes:       Pelvic Lymph Nodes Examined:            2 Sentinel            6 Non-Sentinel  8 Total       Pelvic Lymph Nodes with Metastasis: 4            Macrometastasis: 3            Micrometastasis: 1            Isolated Tumor Cells: 0            Laterality of Lymph Nodes with Tumor: Right (non-sentinel),  Left (sentinel and non-sentinel)            Extracapsular Extension: Present       Para-Aortic Lymph Nodes Examined: Not applicable        Para-Aortic Lymph Nodes with Metastasis: Not applicable  Distant Metastasis:       Distant Site(s) Involved: Omentum: Not involved  Pathologic Stage Classification (pTNM, AJCC 8th Edition): pT2, pN1a  Ancillary Studies: HER2 will be ordered  Additional Findings: Leiomyomata.  Adenomyosis.  Representative Tumor Block: B1  Comment: Dr. Luisa Hart reviewed select slides.  (v4.2.0.1)    01/29/2021 Cancer Staging   Staging form: Corpus Uteri - Carcinoma and Carcinosarcoma, AJCC 8th Edition - Clinical stage from 01/29/2021: FIGO Stage IIIC1 (cT1b, cN1a, cM0) - Signed by Carver Fila, MD on 02/02/2021 Histopathologic type: Mixed cell adenocarcinoma Stage prefix: Initial diagnosis Method of lymph node assessment: Other Histologic grade (G): G3 Histologic grading system: 3 grade system Lymph-vascular invasion (LVI): LVI present/identified, NOS Peritoneal cytology results: Negative Pelvic nodal status: Positive Number of pelvic nodes positive from dissection: 4 Number of pelvic nodes examined during dissection: 8 Para-aortic status: Not assessed Lymph  node metastasis: Present Omentectomy performed: Yes Morcellation performed: No   02/19/2021 Echocardiogram    1. Left ventricular ejection fraction, by estimation, is 55 to 60%. The left ventricle has normal function. The left ventricle demonstrates regional wall motion abnormalities (see scoring diagram/findings for description). There is mild left ventricular  hypertrophy. Left ventricular diastolic parameters are consistent with Grade I diastolic dysfunction (impaired relaxation). There is moderate hypokinesis of the left ventricular, basal septal wall and inferior wall. The average left ventricular global longitudinal strain is -17.9 %. The global longitudinal strain is abnormal.  2. Right ventricular systolic function is normal. The right ventricular size is normal. There is normal pulmonary artery systolic pressure. The estimated right ventricular systolic pressure is 21.7 mmHg.  3. The mitral valve is abnormal. Trivial mitral valve regurgitation.  4. The aortic valve is tricuspid. Aortic valve regurgitation is not visualized.  5. The inferior vena cava is normal in size with greater than 50% respiratory variability, suggesting right atrial pressure of 3 mmHg.   02/20/2021 Procedure   Successful placement of a right IJ approach Power Port with ultrasound and fluoroscopic guidance. The catheter is ready for use.   02/28/2021 - 06/14/2021 Chemotherapy    Patient is on Treatment Plan: UTERINE CARBOPLATIN AUC 6 / PACLITAXEL Q21D       04/20/2021 Imaging   Status post hysterectomy and suspected bilateral salpingo-oophorectomy.   2.2 cm fluid density lesion along the left pelvic sidewall likely reflects a postoperative seroma.   No findings suspicious for recurrent or metastatic disease.   07/16/2021 - 08/16/2021 Radiation Therapy   Radiation Treatment Dates: 07/16/2021 through 08/16/2021 Site Technique Total Dose (Gy) Dose per Fx (Gy) Completed Fx Beam Energies  Vagina: Pelvis HDR-brachy 30/30 6 5/5  Ir-192        10/01/2021 Imaging   Increasing size of LEFT pelvic lymph nodes, largest approximately 11 mm along the external iliac chain. Given  findings and history could consider PET for further evaluation as warranted.   Cystic area along the LEFT pelvic sidewall that was seen previously has improved.   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   Aortic Atherosclerosis (ICD10-I70.0).   12/21/2021 Imaging   1. Enlarging soft tissue masses involving the vaginal cuff. 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 Speculum examination and re-biopsy may be confirmatory. PET-CT may be helpful. 2. New small sub 5 mm perirectal and sigmoid mesocolon nodes. 3. Slight progression of pelvic adenopathy as detailed above. 4. No findings suspicious for abdominal metastatic disease or osseous metastatic disease. 5. Stable age advanced atherosclerotic calcifications involving the aorta and branch vessels and coronary arteries   12/25/2021 Echocardiogram    1. Global longitudinal strain is -16.1% Mild basal inferior hypoinesis. Compared to echo from 02/2021, no change in overall LVEF /regional wall motion. Global longitudinal strain is mildly less negative (-17.9% to -16.1%. Left ventricular ejection fraction, by estimation, is 65 to 70%. The left ventricle has normal function. The left ventricle has no regional wall motion abnormalities. There is mild left ventricular hypertrophy. Left ventricular diastolic parameters are indeterminate.  2. Right ventricular systolic function is normal. The right ventricular size is normal. There is normal pulmonary artery systolic pressure.  3. Trivial mitral valve regurgitation.  4. The aortic valve is normal in structure. Aortic valve regurgitation is not visualized.  5. The inferior vena cava is normal in size with greater than 50% respiratory  variability, suggesting right atrial pressure of 3 mmHg.     01/01/2022 - 01/22/2022 Chemotherapy   Patient is on Treatment Plan : UTERINE SEROUS CARCINOMA Carboplatin + Paclitaxel + Trastuzumab q21d x 6 Cycles / Trastuzumab q21d     03/29/2022 Pathology Results   A.   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   COMMENT:   The carcinoma has diffuse strong Pax8 immunoreactivity (Mullerian marker) and abnormal overexpression of nuclear p53 and relatively diffuse strong p16 staining.  Estrogen receptor (ER) is weakly reactive  and there is focal strong Napsin A immunoreactivity.  The immunophenotype is most characteristic of serous carcinoma.   The history of prior treatment for endometrial serous carcinoma is noted.   04/12/2022 Imaging   1. Surgical change of prior hysterectomy with increased nodular thickening at the vaginal cuff, suspicious for recurrent disease. Consider further evaluation with PET-CT versus direct tissue sampling. 2. Unchanged prominent/mildly enlarged retroperitoneal and pelvic lymph nodes, nonspecific but in the setting of disease recurrence these would be at least somewhat suspicious for nodal involvement. Consider attention on follow-up imaging. 3. No evidence of metastatic disease within the chest. 4. Left-sided colonic diverticulosis without findings of acute diverticulitis. 5.  Aortic Atherosclerosis (ICD10-I70.0).   04/16/2022 - 08/09/2022 Chemotherapy   Patient is on Treatment Plan : UTERINE Lenvatinib (20) D1-21 + Pembrolizumab (200) D1 q21d     04/16/2022 -  Chemotherapy   Patient is on Treatment Plan : UTERINE Lenvatinib (20) D1-21 + Pembrolizumab (200) D1 q21d     07/09/2022 Imaging   Mild increase in mild left para-aortic and left iliac lymphadenopathy, consistent with metastatic lymphadenopathy.   Stable nodular thickening of  vaginal cuff, consistent with vaginal recurrence.   Colonic diverticulosis, without radiographic evidence of diverticulitis.   Probable tiny calcified gallstone. No radiographic evidence  of cholecystitis.   Aortic Atherosclerosis (ICD10-I70.0).     09/12/2022 Imaging   1. Mild decrease in thickening along the vaginal cuff. 2. LEFT iliac and LEFT periaortic retroperitoneal adenopathy is stable to slightly decreased. 3. No evidence of new lymphadenopathy or progressive metastatic uterine carcinoma.      12/19/2022 Imaging   Persistent thickening of the walls of the vaginal cuff with some luminal fluid and air. The focal nodular component measured previously is less well-defined today relative to the adjacent wall thickening.   Persistent lymph node enlargement in the retroperitoneum and left pelvic sidewall. Some areas are similar and others are slightly increased. No new areas of lymph node enlargement.    Gallstones.     03/21/2023 Imaging   1. Persistent mass-like thickening of the vagina, with increasing nodular component in the right side of the vaginal apex concerning for progressive disease. 2. Previously noted lymphadenopathy in the pelvis and retroperitoneum appears relatively similar to the prior study. 3. No new extranodal sites of metastatic disease noted in the abdomen or pelvis. 4. Trace volume of ascites in the low anatomic pelvis.  5. Colonic diverticulosis without evidence of acute diverticulitis at this time. 6. Cholelithiasis without evidence of acute cholecystitis. 7. Aortic atherosclerosis, in addition to at least 2 vessel coronary artery disease. Please note that although the presence of coronary artery calcium documents the presence of coronary artery disease, the severity of this disease and any potential stenosis cannot be assessed on this non-gated CT examination. Assessment for potential risk factor modification, dietary therapy or pharmacologic therapy may be  warranted, if clinically indicated.     PHYSICAL EXAMINATION: ECOG PERFORMANCE STATUS: 2 - Symptomatic, <50% confined to bed  Vitals:   05/29/23 0951  BP: 104/67  Pulse: 75  Resp: 18  Temp: (!) 97.4 F (36.3 C)  SpO2: 100%   Filed Weights   05/29/23 0951  Weight: 187 lb 6.4 oz (85 kg)    GENERAL:alert, no distress and comfortable NEURO: alert & oriented x 3 with fluent speech, no focal motor/sensory deficits  LABORATORY DATA:  I have reviewed the data as listed    Component Value Date/Time   NA 140 05/29/2023 0924   NA 143 10/18/2020 1028   K 3.9 05/29/2023 0924   CL 106 05/29/2023 0924   CO2 25 05/29/2023 0924   GLUCOSE 186 (H) 05/29/2023 0924   BUN 30 (H) 05/29/2023 0924   BUN 13 10/18/2020 1028   CREATININE 1.32 (H) 05/29/2023 0924   CREATININE 0.79 02/11/2017 0943   CALCIUM 9.7 05/29/2023 0924   PROT 7.4 05/29/2023 0924   PROT 6.7 10/18/2020 1028   ALBUMIN 3.5 05/29/2023 0924   ALBUMIN 4.1 10/18/2020 1028   AST 11 (L) 05/29/2023 0924   ALT 8 05/29/2023 0924   ALKPHOS 73 05/29/2023 0924   BILITOT 0.3 05/29/2023 0924   GFRNONAA 45 (L) 05/29/2023 0924   GFRNONAA 83 02/11/2017 0943   GFRAA 70 10/18/2020 1028   GFRAA >89 02/11/2017 0943    No results found for: "SPEP", "UPEP"  Lab Results  Component Value Date   WBC 6.1 05/29/2023   NEUTROABS 3.7 05/29/2023   HGB 12.3 05/29/2023   HCT 38.1 05/29/2023   MCV 91.4 05/29/2023   PLT 360 05/29/2023      Chemistry      Component Value Date/Time   NA 140 05/29/2023 0924   NA 143 10/18/2020 1028   K 3.9 05/29/2023 0924   CL 106 05/29/2023 0924  CO2 25 05/29/2023 0924   BUN 30 (H) 05/29/2023 0924   BUN 13 10/18/2020 1028   CREATININE 1.32 (H) 05/29/2023 0924   CREATININE 0.79 02/11/2017 0943      Component Value Date/Time   CALCIUM 9.7 05/29/2023 0924   ALKPHOS 73 05/29/2023 0924   AST 11 (L) 05/29/2023 0924   ALT 8 05/29/2023 0924   BILITOT 0.3 05/29/2023 0924

## 2023-05-29 NOTE — Assessment & Plan Note (Addendum)
Her last imaging study showed positive response to therapy However, I am concerned about her severe abdominal pain that is new for the last 2 weeks I recommend urgent CT imaging study Her urine protein come back very high We will hold Lenvima until further notice

## 2023-05-30 ENCOUNTER — Other Ambulatory Visit: Payer: Self-pay

## 2023-05-30 ENCOUNTER — Other Ambulatory Visit (HOSPITAL_COMMUNITY): Payer: Self-pay

## 2023-05-30 ENCOUNTER — Other Ambulatory Visit: Payer: Self-pay | Admitting: Hematology and Oncology

## 2023-05-30 MED ORDER — TRAMADOL HCL 50 MG PO TABS
100.0000 mg | ORAL_TABLET | Freq: Four times a day (QID) | ORAL | 0 refills | Status: DC | PRN
  Filled 2023-05-30: qty 60, 8d supply, fill #0

## 2023-06-02 ENCOUNTER — Ambulatory Visit (HOSPITAL_COMMUNITY)
Admission: RE | Admit: 2023-06-02 | Discharge: 2023-06-02 | Disposition: A | Discharge status: Home / Self Care | Payer: Medicare Other | Source: Ambulatory Visit | Attending: Hematology and Oncology | Admitting: Hematology and Oncology

## 2023-06-02 ENCOUNTER — Encounter (HOSPITAL_COMMUNITY): Payer: Self-pay

## 2023-06-02 DIAGNOSIS — E1159 Type 2 diabetes mellitus with other circulatory complications: Secondary | ICD-10-CM | POA: Insufficient documentation

## 2023-06-02 DIAGNOSIS — C541 Malignant neoplasm of endometrium: Secondary | ICD-10-CM | POA: Diagnosis present

## 2023-06-02 DIAGNOSIS — E669 Obesity, unspecified: Secondary | ICD-10-CM | POA: Diagnosis present

## 2023-06-02 DIAGNOSIS — E1169 Type 2 diabetes mellitus with other specified complication: Secondary | ICD-10-CM | POA: Diagnosis present

## 2023-06-02 DIAGNOSIS — R1030 Lower abdominal pain, unspecified: Secondary | ICD-10-CM | POA: Diagnosis present

## 2023-06-02 MED ORDER — HEPARIN SOD (PORK) LOCK FLUSH 100 UNIT/ML IV SOLN
INTRAVENOUS | Status: AC
  Filled 2023-06-02: qty 5

## 2023-06-02 MED ORDER — HEPARIN SOD (PORK) LOCK FLUSH 100 UNIT/ML IV SOLN
500.0000 [IU] | Freq: Once | INTRAVENOUS | Status: AC
  Administered 2023-06-02: 500 [IU] via INTRAVENOUS

## 2023-06-02 MED ORDER — SODIUM CHLORIDE (PF) 0.9 % IJ SOLN
INTRAMUSCULAR | Status: AC
  Filled 2023-06-02: qty 50

## 2023-06-02 MED ORDER — IOHEXOL 300 MG/ML  SOLN
75.0000 mL | Freq: Once | INTRAMUSCULAR | Status: AC | PRN
  Administered 2023-06-02: 75 mL via INTRAVENOUS

## 2023-06-05 ENCOUNTER — Inpatient Hospital Stay (HOSPITAL_BASED_OUTPATIENT_CLINIC_OR_DEPARTMENT_OTHER): Payer: Medicare Other | Admitting: Hematology and Oncology

## 2023-06-05 ENCOUNTER — Encounter: Payer: Self-pay | Admitting: Hematology and Oncology

## 2023-06-05 ENCOUNTER — Other Ambulatory Visit: Payer: Self-pay

## 2023-06-05 VITALS — BP 125/62 | HR 75 | Temp 98.1°F | Resp 18 | Ht 62.0 in | Wt 192.0 lb

## 2023-06-05 DIAGNOSIS — Z7189 Other specified counseling: Secondary | ICD-10-CM

## 2023-06-05 DIAGNOSIS — C541 Malignant neoplasm of endometrium: Secondary | ICD-10-CM | POA: Diagnosis not present

## 2023-06-05 DIAGNOSIS — R1033 Periumbilical pain: Secondary | ICD-10-CM | POA: Diagnosis not present

## 2023-06-05 DIAGNOSIS — Z5112 Encounter for antineoplastic immunotherapy: Secondary | ICD-10-CM | POA: Diagnosis not present

## 2023-06-05 NOTE — Assessment & Plan Note (Signed)
We have several goals of care discussions in the past The patient tolerated systemic chemotherapy poorly Unfortunately, she is now progressed on pembrolizumab and lenvatinib We discussed prognosis with and without treatment

## 2023-06-05 NOTE — Assessment & Plan Note (Signed)
Her abdominal pain is slightly better I suspect this an element of constipation causing recent severe abdominal pain Recommend regular laxatives

## 2023-06-05 NOTE — Progress Notes (Signed)
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[facility]) I have reviewed multiple imaging studies with the patient and her daughter Unfortunately, she has progression of disease Will discontinue lenvatinib and pembrolizumab I reviewed prior treatment The patient tolerated carboplatin and paclitaxel very poorly She had history of cardiomyopathy; I am concerned about giving her treatment with Doxil We discussed potential single agent treatment with carboplatin only or docetaxel The risk, benefits, side effects of treatment were discussed and she is undecided She will think about it and call me next week Due to recent heavy proteinuria, I will continue to monitor urine protein and TSH in the future  Goals of care, counseling/discussion We have several goals of care discussions in the past The patient tolerated systemic chemotherapy poorly Unfortunately, she is now progressed on pembrolizumab and lenvatinib We discussed prognosis with and without treatment  Abdominal pain Her abdominal pain is slightly better I suspect this an element of constipation causing recent severe abdominal pain Recommend regular laxatives  No orders of the defined types were placed in this encounter.   All questions were answered. The patient knows to call the clinic with any problems, questions or concerns. The total time spent in the appointment was 40 minutes encounter with patients including review of chart and various tests results, discussions about plan of care and coordination of care plan   Artis Delay, MD 06/05/2023 10:16 AM  INTERVAL HISTORY: Please see below for problem oriented charting. she returns for review of test results She is here accompanied by her daughter Abdominal pain is slightly better We  spent majority of our time reviewing multiple imaging studies and discussed treatment options  REVIEW OF SYSTEMS:   Constitutional: Denies fevers, chills or abnormal weight loss Eyes: Denies blurriness of vision Ears, nose, mouth, throat, and face: Denies mucositis or sore throat Respiratory: Denies cough, dyspnea or wheezes Cardiovascular: Denies palpitation, chest discomfort or lower extremity swelling Skin: Denies abnormal skin rashes Lymphatics: Denies new lymphadenopathy or easy bruising Neurological:Denies numbness, tingling or new weaknesses Behavioral/Psych: Mood is stable, no new changes  All other systems were reviewed with the patient and are negative.  I have reviewed the past medical history, past surgical history, social history and family history with the patient and they are unchanged from previous note.  ALLERGIES:  has No Known Allergies.  MEDICATIONS:  Current Outpatient Medications  Medication Sig Dispense Refill   amLODipine (NORVASC) 10 MG tablet Take 1 tablet (10 mg total) by mouth daily. 90 tablet 1   aspirin 81 MG EC tablet Take 1 tablet (81 mg total) by mouth daily. 30 tablet 12   atorvastatin (LIPITOR) 80 MG tablet Take 1 tablet (80 mg total) by mouth at bedtime. 90 tablet 1   empagliflozin (JARDIANCE) 10 MG TABS tablet Take 1 tablet (10 mg total) by mouth daily before breakfast. 90 tablet 1   gabapentin (NEURONTIN) 300 MG capsule Take 1 capsule (300 mg total) by mouth at bedtime. 90 capsule 1   glimepiride (AMARYL) 4 MG tablet Take 1 tablet (4 mg total) by mouth daily with breakfast. 90 tablet 1   isosorbide mononitrate (IMDUR) 30 MG 24 hr tablet Take 2 tablets (60 mg total) by mouth daily. 180 tablet 1   lidocaine-prilocaine (EMLA) cream Apply 1 Application topically as needed. 30 g 0   lisinopril (  ZESTRIL) 10 MG tablet Take 3 tablets (30 mg total) by mouth daily. 270 tablet 1   metFORMIN (GLUCOPHAGE) 500 MG tablet Take 2 tablets (1,000 mg total) by mouth 2  (two) times daily with a meal. 360 tablet 1   metoprolol tartrate (LOPRESSOR) 100 MG tablet Take 1 tablet (100 mg total) by mouth 2 (two) times daily. 180 tablet 1   nitroGLYCERIN (NITROSTAT) 0.4 MG SL tablet Place 1 tablet (0.4 mg total) under the tongue every 5 (five) minutes as needed for chest pain. 25 tablet 2   terbinafine (LAMISIL) 250 MG tablet Take 1 tablet (250 mg total) by mouth daily. 90 tablet 0   tirzepatide (MOUNJARO) 10 MG/0.5ML Pen Inject 10 mg into the skin once a week. 6 mL 6   tirzepatide (MOUNJARO) 12.5 MG/0.5ML Pen Inject 12.5 mg into the skin once a week. 6 mL 6   traMADol (ULTRAM) 50 MG tablet Take 2 tablets (100 mg total) by mouth every 6 (six) hours as needed. 60 tablet 0   No current facility-administered medications for this visit.    SUMMARY OF ONCOLOGIC HISTORY: Oncology History Overview Note  Pap 12/14/20: adenocarcinoma, HPV negative  HER-2 positive by FISH Her-2 equivocal by [facility]  [facility] stable    Papillary serous adenocarcinoma of endometrium (HCC)   Initial Diagnosis   Endometrial carcinoma (HCC)   01/12/2021 Initial Biopsy   A. ENDOCERVIX, CURETTAGE:  - Adenocarcinoma.  B. ENDOMETRIUM, BIOPSY:  - Adenocarcinoma.  COMMENT:  The differential diagnosis includes high grade serous carcinoma.  Both  specimens have a similar morphology; high grade serous carcinoma more  commonly arises in the endometrium.  Results reported to Dr. Valentina Shaggy  on 01/15/2021.  Dr. Luisa Hart reviewed the case.    01/16/2021 Tumor Marker   Patient's tumor was tested for the following markers: CA-125 Results of the tumor marker test revealed normal value, 10.7   01/25/2021 Imaging   CT C/A/P: 1. Small volume fluid in the endometrial canal with 7 mm hypoattenuating lesion in the myometrium of the anterior fundus. 2. No definite evidence for metastatic disease in the chest, abdomen, or pelvis. Small lymph nodes are noted along both pelvic sidewalls. Attention on follow-up  recommended. 3. 5 mm ground-glass opacity peripheral left upper lobe. This is likely related to infection/inflammation and potentially scar. Attention on surveillance imaging recommended. 4. 5.8 cm lesion posterior lower uterine segment likely a fibroid. Ultrasound exam from 01/28/2010 demonstrated a 5.2 cm lesion in the left aspect of the posterior lower uterine body. 5. Aortic Atherosclerosis (ICD10-I70.0).   01/29/2021 Surgery   TRH/BSO, SLN biopsy left, selective pelvic LND right, omentectomy  Findings: On EUA, 10-12cm enlarged mobile uterus. On intra-abdomina entry, minimal adhesions between the liver and anterior abdomen on the right. Some changes c/w fatty liver on the left. Omentum, stomach, small and large bowel all grossly normal. Bilateral ovaries normal appearing. Uterus with 5-6cm posterior fibroid, otherwise normal appearing. No mapping to right pelvis. On left, mapping to the level of the superior vessel artery, enlarged lymph node noted (likely sentinel) within the upper aspect of the obturator space. In bilateral pelvic basins, multiple enlarged lymph nodes. On the right, enlarged lymph nodes extended superiorly along the common iliac vessels. At the end of surgery, no obvious abdominal or pelvic evidence of disease.   01/29/2021 Pathology Results   A. SENTINEL LYMPH NODE, LEFT INTERNAL ILIAC, BIOPSY:  - Metastatic carcinoma in (1) of (1) lymph node.   B. SENTINEL LYMPH NODE, LEFT OBTURATOR, BIOPSY:  -  Metastatic carcinoma in (1) of (1) lymph node.   C. LYMPH NODES, LEFT PELVIC, DISSECTION:  - Metastatic carcinoma in (1) of (3) lymph nodes.   D. LYMPH NODE, RIGHT EXTERNAL AND COMMON ILIAC, BIOPSY:  - Metastatic carcinoma in (1) of (1) lymph node.   E. UTERUS, CERVIX AND BILATERAL FALLOPIAN TUBES AND OVARIES, TOTAL  HYSTERECTOMY AND BILATERAL SALPINGO-OOPHORECTOMY:  - High grade serous carcinoma of endometrium, with invasion more than  half of the myometrium.  - Tumor  invades the stromal connective tissue of the cervix.  - No involvement of uterine serosa or adnexa.  - Lymphovascular invasion is identified.  - See oncology table.   F. OMENTUM, OMENTECTOMY:  - Omentum, negative for carcinoma.   G. LYMPH NODES, RIGHT PELVIC, DISSECTION:  - Two lymph nodes, negative for carcinoma (0/2).   ONCOLOGY TABLE:   UTERUS, CARCINOMA OR CARCINOSARCOMA: Resection   Procedure: Total hysterectomy and bilateral salpingo-oophorectomy,  Omentectomy, Lymph node sampling  Histologic Type: Serous carcinoma  Histologic Grade: High grade  Myometrial Invasion: > 50%  Uterine Serosa Involvement: Not identified  Cervical Stroma Involvement: Present  Other Tissue/Organ Involvement: Not identified  Peritoneal/Ascitic Fluid: Negative for carcinoma  Lymphovascular Invasion: Present  Regional Lymph Nodes:       Pelvic Lymph Nodes Examined:            2 Sentinel            6 Non-Sentinel            8 Total       Pelvic Lymph Nodes with Metastasis: 4            Macrometastasis: 3            Micrometastasis: 1            Isolated Tumor Cells: 0            Laterality of Lymph Nodes with Tumor: Right (non-sentinel),  Left (sentinel and non-sentinel)            Extracapsular Extension: Present       Para-Aortic Lymph Nodes Examined: Not applicable        Para-Aortic Lymph Nodes with Metastasis: Not applicable  Distant Metastasis:       Distant Site(s) Involved: Omentum: Not involved  Pathologic Stage Classification (pTNM, AJCC 8th Edition): pT2, pN1a  Ancillary Studies: HER2 will be ordered  Additional Findings: Leiomyomata.  Adenomyosis.  Representative Tumor Block: B1  Comment: Dr. Luisa Hart reviewed select slides.  (v4.2.0.1)    01/29/2021 Cancer Staging   Staging form: Corpus Uteri - Carcinoma and Carcinosarcoma, AJCC 8th Edition - Clinical stage from 01/29/2021: FIGO Stage IIIC1 (cT1b, cN1a, cM0) - Signed by Carver Fila, MD on 02/02/2021 Histopathologic  type: Mixed cell adenocarcinoma Stage prefix: Initial diagnosis Method of lymph node assessment: Other Histologic grade (G): G3 Histologic grading system: 3 grade system Lymph-vascular invasion (LVI): LVI present/identified, NOS Peritoneal cytology results: Negative Pelvic nodal status: Positive Number of pelvic nodes positive from dissection: 4 Number of pelvic nodes examined during dissection: 8 Para-aortic status: Not assessed Lymph node metastasis: Present Omentectomy performed: Yes Morcellation performed: No   02/19/2021 Echocardiogram    1. Left ventricular ejection fraction, by estimation, is 55 to 60%. The left ventricle has normal function. The left ventricle demonstrates regional wall motion abnormalities (see scoring diagram/findings for description). There is mild left ventricular  hypertrophy. Left ventricular diastolic parameters are consistent with Grade I diastolic dysfunction (impaired relaxation). There is moderate hypokinesis of  the left ventricular, basal septal wall and inferior wall. The average left ventricular global longitudinal strain is -17.9 %. The global longitudinal strain is abnormal.  2. Right ventricular systolic function is normal. The right ventricular size is normal. There is normal pulmonary artery systolic pressure. The estimated right ventricular systolic pressure is 21.7 mmHg.  3. The mitral valve is abnormal. Trivial mitral valve regurgitation.  4. The aortic valve is tricuspid. Aortic valve regurgitation is not visualized.  5. The inferior vena cava is normal in size with greater than 50% respiratory variability, suggesting right atrial pressure of 3 mmHg.   02/20/2021 Procedure   Successful placement of a right IJ approach Power Port with ultrasound and fluoroscopic guidance. The catheter is ready for use.   02/28/2021 - 06/14/2021 Chemotherapy    Patient is on Treatment Plan: UTERINE CARBOPLATIN AUC 6 / PACLITAXEL Q21D       04/20/2021 Imaging    Status post hysterectomy and suspected bilateral salpingo-oophorectomy.   2.2 cm fluid density lesion along the left pelvic sidewall likely reflects a postoperative seroma.   No findings suspicious for recurrent or metastatic disease.   07/16/2021 - 08/16/2021 Radiation Therapy   Radiation Treatment Dates: 07/16/2021 through 08/16/2021 Site Technique Total Dose (Gy) Dose per Fx (Gy) Completed Fx Beam Energies  Vagina: Pelvis HDR-brachy 30/30 6 5/5 Ir-192        10/01/2021 Imaging   Increasing size of LEFT pelvic lymph nodes, largest approximately 11 mm along the external iliac chain. Given findings and history could consider PET for further evaluation as warranted.   Cystic area along the LEFT pelvic sidewall that was seen previously has improved.   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   Aortic Atherosclerosis (ICD10-I70.0).   12/21/2021 Imaging   1. Enlarging soft tissue masses involving the vaginal cuff. 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 Speculum examination and re-biopsy may be confirmatory. PET-CT may be helpful. 2. New small sub 5 mm perirectal and sigmoid mesocolon nodes. 3. Slight progression of pelvic adenopathy as detailed above. 4. No findings suspicious for abdominal metastatic disease or osseous metastatic disease. 5. Stable age advanced atherosclerotic calcifications involving the aorta and branch vessels and coronary arteries   12/25/2021 Echocardiogram    1. Global longitudinal strain is -16.1% Mild basal inferior hypoinesis. Compared to echo from 02/2021, no change in overall LVEF /regional wall motion. Global longitudinal strain is mildly less negative (-17.9% to -16.1%. Left ventricular ejection fraction, by estimation, is 65 to 70%. The left ventricle has normal function. The left ventricle has no regional wall motion abnormalities. There is  mild left ventricular hypertrophy. Left ventricular diastolic parameters are indeterminate.  2. Right ventricular systolic function is normal. The right ventricular size is normal. There is normal pulmonary artery systolic pressure.  3. Trivial mitral valve regurgitation.  4. The aortic valve is normal in structure. Aortic valve regurgitation is not visualized.  5. The inferior vena cava is normal in size with greater than 50% respiratory variability, suggesting right atrial pressure of 3 mmHg.     01/01/2022 - 01/22/2022 Chemotherapy   Patient is on Treatment Plan : UTERINE SEROUS CARCINOMA Carboplatin + Paclitaxel + Trastuzumab q21d x 6 Cycles / Trastuzumab q21d     03/29/2022 Pathology Results   A.   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   COMMENT:   The carcinoma has diffuse strong Pax8 immunoreactivity (Mullerian marker) and abnormal overexpression of nuclear p53 and relatively diffuse strong p16 staining.  Estrogen receptor (ER) is weakly reactive  and there is focal strong Napsin A immunoreactivity.  The immunophenotype is most characteristic of serous carcinoma.   The history of prior treatment for endometrial serous carcinoma is noted.   04/12/2022 Imaging   1. Surgical change of prior hysterectomy with increased nodular thickening at the vaginal cuff, suspicious for recurrent disease. Consider further evaluation with PET-CT versus direct tissue sampling. 2. Unchanged prominent/mildly enlarged retroperitoneal and pelvic lymph nodes, nonspecific but in the setting of disease recurrence these would be at least somewhat suspicious for nodal involvement. Consider attention on follow-up imaging. 3. No evidence of metastatic disease within the chest. 4. Left-sided colonic diverticulosis without findings of acute diverticulitis. 5.  Aortic Atherosclerosis  (ICD10-I70.0).   04/16/2022 - 08/09/2022 Chemotherapy   Patient is on Treatment Plan : UTERINE Lenvatinib (20) D1-21 + Pembrolizumab (200) D1 q21d     04/16/2022 - 05/29/2023 Chemotherapy   Patient is on Treatment Plan : UTERINE Lenvatinib (20) D1-21 + Pembrolizumab (200) D1 q21d     07/09/2022 Imaging   Mild increase in mild left para-aortic and left iliac lymphadenopathy, consistent with metastatic lymphadenopathy.   Stable nodular thickening of vaginal cuff, consistent with vaginal recurrence.   Colonic diverticulosis, without radiographic evidence of diverticulitis.   Probable tiny calcified gallstone. No radiographic evidence of cholecystitis.   Aortic Atherosclerosis (ICD10-I70.0).     09/12/2022 Imaging   1. Mild decrease in thickening along the vaginal cuff. 2. LEFT iliac and LEFT periaortic retroperitoneal adenopathy is stable to slightly decreased. 3. No evidence of new lymphadenopathy or progressive metastatic uterine carcinoma.      12/19/2022 Imaging   Persistent thickening of the walls of the vaginal cuff with some luminal fluid and air. The focal nodular component measured previously is less well-defined today relative to the adjacent wall thickening.   Persistent lymph node enlargement in the retroperitoneum and left pelvic sidewall. Some areas are similar and others are slightly increased. No new areas of lymph node enlargement.    Gallstones.     03/21/2023 Imaging   1. Persistent mass-like thickening of the vagina, with increasing nodular component in the right side of the vaginal apex concerning for progressive disease. 2. Previously noted lymphadenopathy in the pelvis and retroperitoneum appears relatively similar to the prior study. 3. No new extranodal sites of metastatic disease noted in the abdomen or pelvis. 4. Trace volume of ascites in the low anatomic pelvis.  5. Colonic diverticulosis without evidence of acute diverticulitis at this time. 6. Cholelithiasis  without evidence of acute cholecystitis. 7. Aortic atherosclerosis, in addition to at least 2 vessel coronary artery disease. Please note that although the presence of coronary artery calcium documents the presence of coronary artery disease, the severity of this disease and any potential stenosis cannot be assessed on this non-gated CT examination. Assessment for potential risk factor modification, dietary therapy or pharmacologic therapy may be warranted, if clinically indicated.   06/05/2023 Imaging   CT ABDOMEN PELVIS W CONTRAST  Result Date: 06/04/2023 CLINICAL DATA:  LLQ abdominal pain uterine cancer severe abdominal pain * Tracking Code: BO * EXAM: CT ABDOMEN AND PELVIS WITH CONTRAST TECHNIQUE: Multidetector CT imaging of the abdomen and pelvis was performed using the standard protocol following bolus administration of intravenous contrast. RADIATION DOSE REDUCTION: This exam was performed  according to the departmental dose-optimization program which includes automated exposure control, adjustment of the mA and/or kV according to patient size and/or use of iterative reconstruction technique. CONTRAST:  75mL OMNIPAQUE IOHEXOL 300 MG/ML  SOLN COMPARISON:  CT scan abdomen and pelvis from 03/20/2023. FINDINGS: Lower chest: There are atelectatic changes in the visualized lung bases. No overt consolidation. No pleural effusion. The heart is normal in size. No pericardial effusion. There are coronary artery atherosclerotic calcifications, in keeping with coronary artery disease. Mitral annulus calcification also noted. Hepatobiliary: The liver is normal in size. Non-cirrhotic configuration. No suspicious mass. No intrahepatic or extrahepatic bile duct dilation. Redemonstration of a single sub 5 mm gallstone without imaging evidence of acute cholecystitis. Pancreas: Unremarkable. No pancreatic ductal dilatation or surrounding inflammatory changes. Spleen: Within normal limits. Stable hypoattenuating area near  the cleft along the lateral aspect. No new suspicious focal lesion. Adrenals/Urinary Tract: Adrenal glands are unremarkable. There is an ill-defined approximately 6 x 8 mm hypoattenuating lesion in the right kidney lower pole, which is incompletely characterized on the current examination. However, the lesion appears enlarged since the prior study from April when it measured up to 4 x 5 mm. The lesion is not discretely seen on prior exam from January. This is concerning for neoplastic process and further evaluation with multiphasic contrast enhanced MRI abdomen as per renal mass protocol is recommended. No other suspicious renal mass. No hydronephrosis. No renal or ureteric calculi. Unremarkable urinary bladder. Stomach/Bowel: No disproportionate dilation of the small or large bowel loops. No evidence of abnormal bowel wall thickening or inflammatory changes. The appendix is unremarkable. There are multiple diverticula throughout the colon, without imaging signs of diverticulitis. Vascular/Lymphatic: No ascites or pneumoperitoneum. Redemonstration of multiple enlarged retroperitoneal lymph nodes with largest in the left para-aortic region measuring up to 1.1 x 1.2 cm, which appears minimally enlarged since the prior study. There is also a larger lymph node just below the bifurcation of abdominal aorta, which also appear minimally enlarged. There are also several subcentimeter perirectal lymph nodes (marked with electronic arrow sign on series 2, images 70, 75, 77, etc.) Which are new or slightly enlarged since the prior study findings overall. Findings suggest worsening metastatic disease. No aneurysmal dilation of the major abdominal arteries. There are moderate peripheral atherosclerotic vascular calcifications of the aorta and its major branches. Reproductive: Patient is status post hysterectomy. Redemonstration of irregular and heterogeneous thickening of the vaginal vault, grossly similar to the prior study.  There is redemonstration of hyperattenuating nodule in the right-sided vaginal vault measuring approximately 1.6 x 2.2 cm, grossly similar to the prior study. Other: There is a small supraumbilical/periumbilical fat containing hernia. The soft tissues and abdominal wall are otherwise unremarkable. Musculoskeletal: No suspicious osseous lesions. There are mild multilevel degenerative changes in the visualized spine. IMPRESSION: 1. There is mild interval increase in the pre-existing retroperitoneal lymph nodes and few new/enlarging perirectal lymph nodes, as discussed in detail above. 2. There is a slowly growing, currently 6 x 8 mm hypoattenuating lesion in the right kidney lower pole with imaging characteristics and follow-up recommendations, as described above. 3. Multiple other nonacute observations, as described above. Aortic Atherosclerosis (ICD10-I70.0). Electronically Signed   By: Jules Schick M.D.   On: 06/04/2023 17:21        PHYSICAL EXAMINATION: ECOG PERFORMANCE STATUS: 1 - Symptomatic but completely ambulatory  Vitals:   06/05/23 0823  BP: 125/62  Pulse: 75  Resp: 18  Temp: 98.1 F (36.7 C)  SpO2: 100%  Filed Weights   06/05/23 0823  Weight: 192 lb (87.1 kg)    GENERAL:alert, no distress and comfortable NEURO: alert & oriented x 3 with fluent speech, no focal motor/sensory deficits  LABORATORY DATA:  I have reviewed the data as listed    Component Value Date/Time   NA 140 05/29/2023 0924   NA 143 10/18/2020 1028   K 3.9 05/29/2023 0924   CL 106 05/29/2023 0924   CO2 25 05/29/2023 0924   GLUCOSE 186 (H) 05/29/2023 0924   BUN 30 (H) 05/29/2023 0924   BUN 13 10/18/2020 1028   CREATININE 1.32 (H) 05/29/2023 0924   CREATININE 0.79 02/11/2017 0943   CALCIUM 9.7 05/29/2023 0924   PROT 7.4 05/29/2023 0924   PROT 6.7 10/18/2020 1028   ALBUMIN 3.5 05/29/2023 0924   ALBUMIN 4.1 10/18/2020 1028   AST 11 (L) 05/29/2023 0924   ALT 8 05/29/2023 0924   ALKPHOS 73  05/29/2023 0924   BILITOT 0.3 05/29/2023 0924   GFRNONAA 45 (L) 05/29/2023 0924   GFRNONAA 83 02/11/2017 0943   GFRAA 70 10/18/2020 1028   GFRAA >89 02/11/2017 0943    No results found for: "SPEP", "UPEP"  Lab Results  Component Value Date   WBC 6.1 05/29/2023   NEUTROABS 3.7 05/29/2023   HGB 12.3 05/29/2023   HCT 38.1 05/29/2023   MCV 91.4 05/29/2023   PLT 360 05/29/2023      Chemistry      Component Value Date/Time   NA 140 05/29/2023 0924   NA 143 10/18/2020 1028   K 3.9 05/29/2023 0924   CL 106 05/29/2023 0924   CO2 25 05/29/2023 0924   BUN 30 (H) 05/29/2023 0924   BUN 13 10/18/2020 1028   CREATININE 1.32 (H) 05/29/2023 0924   CREATININE 0.79 02/11/2017 0943      Component Value Date/Time   CALCIUM 9.7 05/29/2023 0924   ALKPHOS 73 05/29/2023 0924   AST 11 (L) 05/29/2023 0924   ALT 8 05/29/2023 0924   BILITOT 0.3 05/29/2023 0924       RADIOGRAPHIC STUDIES: I have reviewed multiple imaging studies with the patient and her daughter I have personally reviewed the radiological images as listed and agreed with the findings in the report. CT ABDOMEN PELVIS W CONTRAST  Result Date: 06/04/2023 CLINICAL DATA:  LLQ abdominal pain uterine cancer severe abdominal pain * Tracking Code: BO * EXAM: CT ABDOMEN AND PELVIS WITH CONTRAST TECHNIQUE: Multidetector CT imaging of the abdomen and pelvis was performed using the standard protocol following bolus administration of intravenous contrast. RADIATION DOSE REDUCTION: This exam was performed according to the departmental dose-optimization program which includes automated exposure control, adjustment of the mA and/or kV according to patient size and/or use of iterative reconstruction technique. CONTRAST:  75mL OMNIPAQUE IOHEXOL 300 MG/ML  SOLN COMPARISON:  CT scan abdomen and pelvis from 03/20/2023. FINDINGS: Lower chest: There are atelectatic changes in the visualized lung bases. No overt consolidation. No pleural effusion. The heart  is normal in size. No pericardial effusion. There are coronary artery atherosclerotic calcifications, in keeping with coronary artery disease. Mitral annulus calcification also noted. Hepatobiliary: The liver is normal in size. Non-cirrhotic configuration. No suspicious mass. No intrahepatic or extrahepatic bile duct dilation. Redemonstration of a single sub 5 mm gallstone without imaging evidence of acute cholecystitis. Pancreas: Unremarkable. No pancreatic ductal dilatation or surrounding inflammatory changes. Spleen: Within normal limits. Stable hypoattenuating area near the cleft along the lateral aspect. No new suspicious focal lesion. Adrenals/Urinary Tract:  Adrenal glands are unremarkable. There is an ill-defined approximately 6 x 8 mm hypoattenuating lesion in the right kidney lower pole, which is incompletely characterized on the current examination. However, the lesion appears enlarged since the prior study from April when it measured up to 4 x 5 mm. The lesion is not discretely seen on prior exam from January. This is concerning for neoplastic process and further evaluation with multiphasic contrast enhanced MRI abdomen as per renal mass protocol is recommended. No other suspicious renal mass. No hydronephrosis. No renal or ureteric calculi. Unremarkable urinary bladder. Stomach/Bowel: No disproportionate dilation of the small or large bowel loops. No evidence of abnormal bowel wall thickening or inflammatory changes. The appendix is unremarkable. There are multiple diverticula throughout the colon, without imaging signs of diverticulitis. Vascular/Lymphatic: No ascites or pneumoperitoneum. Redemonstration of multiple enlarged retroperitoneal lymph nodes with largest in the left para-aortic region measuring up to 1.1 x 1.2 cm, which appears minimally enlarged since the prior study. There is also a larger lymph node just below the bifurcation of abdominal aorta, which also appear minimally enlarged. There  are also several subcentimeter perirectal lymph nodes (marked with electronic arrow sign on series 2, images 70, 75, 77, etc.) Which are new or slightly enlarged since the prior study findings overall. Findings suggest worsening metastatic disease. No aneurysmal dilation of the major abdominal arteries. There are moderate peripheral atherosclerotic vascular calcifications of the aorta and its major branches. Reproductive: Patient is status post hysterectomy. Redemonstration of irregular and heterogeneous thickening of the vaginal vault, grossly similar to the prior study. There is redemonstration of hyperattenuating nodule in the right-sided vaginal vault measuring approximately 1.6 x 2.2 cm, grossly similar to the prior study. Other: There is a small supraumbilical/periumbilical fat containing hernia. The soft tissues and abdominal wall are otherwise unremarkable. Musculoskeletal: No suspicious osseous lesions. There are mild multilevel degenerative changes in the visualized spine. IMPRESSION: 1. There is mild interval increase in the pre-existing retroperitoneal lymph nodes and few new/enlarging perirectal lymph nodes, as discussed in detail above. 2. There is a slowly growing, currently 6 x 8 mm hypoattenuating lesion in the right kidney lower pole with imaging characteristics and follow-up recommendations, as described above. 3. Multiple other nonacute observations, as described above. Aortic Atherosclerosis (ICD10-I70.0). Electronically Signed   By: Jules Schick M.D.   On: 06/04/2023 17:21

## 2023-06-05 NOTE — Assessment & Plan Note (Signed)
I have reviewed multiple imaging studies with the patient and her daughter Unfortunately, she has progression of disease Will discontinue lenvatinib and pembrolizumab I reviewed prior treatment The patient tolerated carboplatin and paclitaxel very poorly She had history of cardiomyopathy; I am concerned about giving her treatment with Doxil We discussed potential single agent treatment with carboplatin only or docetaxel The risk, benefits, side effects of treatment were discussed and she is undecided She will think about it and call me next week Due to recent heavy proteinuria, I will continue to monitor urine protein and TSH in the future

## 2023-06-09 ENCOUNTER — Encounter: Payer: Self-pay | Admitting: Hematology and Oncology

## 2023-06-10 ENCOUNTER — Encounter: Payer: Self-pay | Admitting: Hematology and Oncology

## 2023-06-12 ENCOUNTER — Telehealth: Payer: Self-pay

## 2023-06-12 NOTE — Telephone Encounter (Signed)
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

## 2023-06-12 NOTE — Telephone Encounter (Signed)
Called and given below message and talked about mychart message. She verbalized understanding and would like the referral to palliative care/ hospice referral.  [facility] the [place]. Spoke with Drenda Freeze and she will call Porfirio Mylar to admit [name] into the palliative care program.

## 2023-06-17 ENCOUNTER — Inpatient Hospital Stay: Payer: Medicare Other

## 2023-06-17 ENCOUNTER — Inpatient Hospital Stay: Payer: Medicare Other | Admitting: Hematology and Oncology

## 2023-06-20 ENCOUNTER — Other Ambulatory Visit: Payer: Self-pay | Admitting: Family Medicine

## 2023-06-20 ENCOUNTER — Other Ambulatory Visit (HOSPITAL_COMMUNITY): Payer: Self-pay

## 2023-06-23 ENCOUNTER — Other Ambulatory Visit: Payer: Self-pay | Admitting: Hematology and Oncology

## 2023-06-23 ENCOUNTER — Other Ambulatory Visit (HOSPITAL_COMMUNITY): Payer: Self-pay

## 2023-06-24 ENCOUNTER — Telehealth: Payer: Self-pay | Admitting: *Deleted

## 2023-06-24 ENCOUNTER — Encounter: Payer: Self-pay | Admitting: Hematology and Oncology

## 2023-06-24 ENCOUNTER — Other Ambulatory Visit (HOSPITAL_COMMUNITY): Payer: Self-pay

## 2023-06-24 MED ORDER — TRAMADOL HCL 50 MG PO TABS
100.0000 mg | ORAL_TABLET | Freq: Four times a day (QID) | ORAL | 0 refills | Status: DC | PRN
  Filled 2023-06-24: qty 56, 7d supply, fill #0

## 2023-06-25 ENCOUNTER — Other Ambulatory Visit: Payer: Self-pay

## 2023-06-25 ENCOUNTER — Encounter: Payer: Self-pay | Admitting: Hematology and Oncology

## 2023-06-25 NOTE — Telephone Encounter (Signed)
Late Entry- Telephone call received 06/24/23 from [facility] nurse Will Aundria Rud [phone])   He reports the patient has been [facility] [place]. He reports concerns about uncontrolled pain (no specific site), patient complaints regarding Tramadol related drowsiness and fatigue. Patient's daughter requested refill for Tramadol and they have not received it. He inquires about the next scan and future treatment possibilities, follow up appointments and port flushes. He was asking about future treatment plans.   This RN explained to him that patient has elected no further treatment, no scans would be completed unless a specific concern was noted. Patient does not have follow up appointments and her port could be utilized for IV fluids or blood products if appropriate.   The nurse was upset by this information. This RN tried to explain the patient may call this office at any time to be evaluated, if she has questions or concerns.   This RN called the patient and her daughter directly. They are satisfied with the decision for admission to palliative care. They understand the purpose of the program. We discussed pain management, daughter reports patient is not having uncontrolled pain at this time. Tramadol was refilled and has been resolving discomfort.  They both verbalized to this RN that they do not wish to continue any type of oncology treatment and are seeking a better quality of life for the remaining time allowed.

## 2023-07-01 ENCOUNTER — Telehealth: Payer: Self-pay

## 2023-07-01 ENCOUNTER — Other Ambulatory Visit (HOSPITAL_COMMUNITY): Payer: Self-pay

## 2023-07-01 ENCOUNTER — Other Ambulatory Visit: Payer: Self-pay

## 2023-07-01 ENCOUNTER — Other Ambulatory Visit: Payer: Self-pay | Admitting: Hematology and Oncology

## 2023-07-01 MED ORDER — OXYCODONE HCL 5 MG PO TABS
5.0000 mg | ORAL_TABLET | ORAL | 0 refills | Status: DC | PRN
  Filled 2023-07-01: qty 60, 10d supply, fill #0

## 2023-07-01 NOTE — Telephone Encounter (Signed)
I prefer to send oxycodone Which pharmacy?

## 2023-07-01 NOTE — Telephone Encounter (Signed)
Called and reviewed refill policy with Porfirio Mylar. She is aware.

## 2023-07-01 NOTE — Telephone Encounter (Signed)
Sent to [facility] Remind her of narcotic refill policy

## 2023-07-01 NOTE — Telephone Encounter (Signed)
Called [place] and she said to please send to [facility] pharmacy.

## 2023-07-01 NOTE — Telephone Encounter (Signed)
Elizabeth Other, NP with [facility] called. She went to admit [name] into palliative care today. [name] is having significant abdominal pain. Denies constipation and she is having regular bm's. She is taking tramadol and tylenol that is not helping. Asking if you can send Rx for Norco 5-325 mg TID?

## 2023-07-04 ENCOUNTER — Telehealth: Payer: Self-pay

## 2023-07-04 NOTE — Telephone Encounter (Signed)
Called daughter and told her per Dr. Bertis Ruddy to double the oxycodone Rx and take tylenol 1000 mg every 6 hours for pain. Ask her to call the office back on Monday with update on her Mom.

## 2023-07-04 NOTE — Telephone Encounter (Signed)
Called and left a message for Porfirio Mylar asking her to call the office back.

## 2023-07-04 NOTE — Telephone Encounter (Signed)
Returned call to [facility] and spoke with Will. [name] is having a lot of abdominal pain. She has been taking the oxycodone only and will start 1000 mg tylenol every 6 hours prn for better pain control. She is still in palliative care.  FYI

## 2023-07-08 ENCOUNTER — Ambulatory Visit: Payer: Medicare Other | Admitting: Hematology and Oncology

## 2023-07-08 ENCOUNTER — Ambulatory Visit: Payer: Medicare Other

## 2023-07-08 ENCOUNTER — Other Ambulatory Visit: Payer: Medicare Other

## 2023-07-09 ENCOUNTER — Telehealth: Payer: Self-pay

## 2023-07-09 NOTE — Telephone Encounter (Signed)
I support hospice admission

## 2023-07-09 NOTE — Telephone Encounter (Signed)
Received update from Lindi Adie, RN from [facility] regarding this patient. Noted that patient's pain has improved and is still on recommended pain regimen of two oxycodone every 4 hours PRN and 1,000 mg acetaminophen every 6 hours as needed. Ongoing patient education has been provided on pain management. Patient's biggest complaint is fatigue at this time. Patient is scheduled for hospice admission visit this Friday, July 26. Family and patient may or may not make the decision to enroll in hospice at that time.  Message and updates forwarded to Dr. Bertis Ruddy for further review.

## 2023-07-14 ENCOUNTER — Other Ambulatory Visit (HOSPITAL_COMMUNITY): Payer: Self-pay

## 2023-07-14 ENCOUNTER — Telehealth: Payer: Self-pay

## 2023-07-14 MED ORDER — OXYCONTIN 30 MG PO T12A
EXTENDED_RELEASE_TABLET | ORAL | 0 refills | Status: DC
  Filled 2023-07-14: qty 30, 15d supply, fill #0

## 2023-07-14 NOTE — Telephone Encounter (Signed)
Drenda Freeze from [facility] the [place] called. [name] is now enrolled in hospice as of 7/26 and still at home. They will take over her care.  FYI

## 2023-07-15 ENCOUNTER — Other Ambulatory Visit: Payer: Self-pay

## 2023-07-23 ENCOUNTER — Other Ambulatory Visit: Payer: Self-pay

## 2023-07-23 ENCOUNTER — Other Ambulatory Visit (HOSPITAL_COMMUNITY): Payer: Self-pay

## 2023-07-23 MED ORDER — OXYCODONE HCL 5 MG PO TABS
10.0000 mg | ORAL_TABLET | ORAL | 0 refills | Status: DC | PRN
  Filled 2023-07-23: qty 45, 4d supply, fill #0

## 2023-07-24 ENCOUNTER — Other Ambulatory Visit (HOSPITAL_COMMUNITY): Payer: Self-pay

## 2023-07-24 ENCOUNTER — Encounter: Payer: Self-pay | Admitting: Hematology and Oncology

## 2023-07-24 MED ORDER — SENNA-DOCUSATE SODIUM 8.6-50 MG PO TABS: 2.0000 | ORAL_TABLET | Freq: Every day | ORAL | 3 refills | Status: DC

## 2023-07-24 MED ORDER — OXYCONTIN 30 MG PO T12A
30.0000 mg | EXTENDED_RELEASE_TABLET | Freq: Three times a day (TID) | ORAL | 0 refills | Status: DC
  Filled 2023-07-24: qty 42, 14d supply, fill #0

## 2023-08-05 ENCOUNTER — Other Ambulatory Visit (HOSPITAL_COMMUNITY): Payer: Self-pay

## 2023-08-05 MED ORDER — LORAZEPAM 0.5 MG PO TABS
0.5000 mg | ORAL_TABLET | ORAL | 0 refills | Status: DC | PRN
  Filled 2023-08-05: qty 15, 3d supply, fill #0

## 2023-08-14 ENCOUNTER — Other Ambulatory Visit (HOSPITAL_COMMUNITY): Payer: Self-pay

## 2023-08-17 ENCOUNTER — Encounter: Payer: Self-pay | Admitting: Hematology and Oncology

## 2023-08-17 ENCOUNTER — Other Ambulatory Visit (HOSPITAL_COMMUNITY): Payer: Self-pay

## 2023-08-17 MED ORDER — OXYCONTIN 30 MG PO T12A
30.0000 mg | EXTENDED_RELEASE_TABLET | Freq: Three times a day (TID) | ORAL | 0 refills | Status: DC
  Filled 2023-08-17: qty 42, 14d supply, fill #0

## 2023-08-19 ENCOUNTER — Other Ambulatory Visit: Payer: Self-pay

## 2023-08-19 ENCOUNTER — Other Ambulatory Visit (HOSPITAL_COMMUNITY): Payer: Self-pay

## 2023-08-19 ENCOUNTER — Encounter: Payer: Self-pay | Admitting: Hematology and Oncology

## 2023-08-20 ENCOUNTER — Other Ambulatory Visit (HOSPITAL_COMMUNITY): Payer: Self-pay

## 2023-08-20 MED ORDER — MORPHINE SULFATE ER 30 MG PO TBCR
30.0000 mg | EXTENDED_RELEASE_TABLET | Freq: Three times a day (TID) | ORAL | 0 refills | Status: DC
  Filled 2023-08-20 – 2023-08-21 (×2): qty 45, 15d supply, fill #0

## 2023-08-20 MED ORDER — MORPHINE SULFATE ER 30 MG PO TBCR
30.0000 mg | EXTENDED_RELEASE_TABLET | Freq: Three times a day (TID) | ORAL | 0 refills | Status: DC
  Filled 2023-08-20 (×2): qty 45, 15d supply, fill #0

## 2023-08-21 ENCOUNTER — Other Ambulatory Visit (HOSPITAL_COMMUNITY): Payer: Self-pay

## 2023-08-26 ENCOUNTER — Other Ambulatory Visit (HOSPITAL_COMMUNITY): Payer: Self-pay

## 2023-08-26 MED ORDER — OXYCODONE HCL 5 MG PO TABS
10.0000 mg | ORAL_TABLET | ORAL | 0 refills | Status: DC | PRN
  Filled 2023-08-26: qty 60, 5d supply, fill #0

## 2023-08-26 MED ORDER — FLUCONAZOLE 150 MG PO TABS
150.0000 mg | ORAL_TABLET | Freq: Once | ORAL | 0 refills | Status: AC
  Filled 2023-08-26: qty 1, 1d supply, fill #0

## 2023-08-27 ENCOUNTER — Other Ambulatory Visit: Payer: Self-pay

## 2023-08-28 ENCOUNTER — Other Ambulatory Visit (HOSPITAL_COMMUNITY): Payer: Self-pay

## 2023-09-05 ENCOUNTER — Telehealth: Payer: Self-pay | Admitting: Family Medicine

## 2023-09-05 NOTE — Telephone Encounter (Signed)
I spoke to the patient's daughter Porfirio Mylar who stated mom is currently under hospice care.  She is trying to navigate PCS services for her.  Can you please give her a call and provide her with some guidance?  Thank you.

## 2023-09-08 NOTE — Telephone Encounter (Signed)
I spoke to [place], [place] [facility] [place] regarding PCS request. She said that they submitted the request and as far as she knows , the assessment should have been done last week.  She said if the daughter has any questions she should contact Terri, their SW that is following her now.   I called patient's daughter, [name], and she confirmed that her mother was assessed for services last week. They gave her a list of agencies that will provide the care and instructed her to pick 3 agencies to provide the care. She is concerned about who will be caring for her mother, what are the qualifications of the provider/  I told her that the agencies are contracted with her insurance company we are not able to recommend agencies and she should speak with the hospice SW if she has questions. She said she would call Terri

## 2023-10-17 DEATH — deceased

## 2024-01-27 ENCOUNTER — Other Ambulatory Visit: Payer: Self-pay

## 2024-01-28 ENCOUNTER — Other Ambulatory Visit: Payer: Self-pay

## 2024-01-29 ENCOUNTER — Other Ambulatory Visit: Payer: Self-pay

## 2024-03-26 ENCOUNTER — Other Ambulatory Visit: Payer: Self-pay

## 2024-06-14 ENCOUNTER — Other Ambulatory Visit: Payer: Self-pay

## 2024-10-07 ENCOUNTER — Other Ambulatory Visit (HOSPITAL_COMMUNITY): Payer: Self-pay
